# Patient Record
Sex: Male | Born: 1935
Health system: Southern US, Community
[De-identification: ages and names within clinical notes are randomized; demographics above are authoritative.]

## PROBLEM LIST (undated history)

## (undated) ENCOUNTER — Emergency Department (HOSPITAL_COMMUNITY): Admission: EM | Source: Home / Self Care

## (undated) DIAGNOSIS — I10 Essential (primary) hypertension: Secondary | ICD-10-CM

## (undated) DIAGNOSIS — I779 Disorder of arteries and arterioles, unspecified: Secondary | ICD-10-CM

## (undated) DIAGNOSIS — N400 Enlarged prostate without lower urinary tract symptoms: Secondary | ICD-10-CM

## (undated) DIAGNOSIS — I447 Left bundle-branch block, unspecified: Secondary | ICD-10-CM

## (undated) DIAGNOSIS — F0280 Dementia in other diseases classified elsewhere without behavioral disturbance: Secondary | ICD-10-CM

## (undated) DIAGNOSIS — I739 Peripheral vascular disease, unspecified: Secondary | ICD-10-CM

## (undated) DIAGNOSIS — U071 COVID-19: Secondary | ICD-10-CM

## (undated) DIAGNOSIS — E039 Hypothyroidism, unspecified: Secondary | ICD-10-CM

## (undated) DIAGNOSIS — F419 Anxiety disorder, unspecified: Secondary | ICD-10-CM

## (undated) DIAGNOSIS — IMO0002 Reserved for concepts with insufficient information to code with codable children: Secondary | ICD-10-CM

## (undated) DIAGNOSIS — K2940 Chronic atrophic gastritis without bleeding: Secondary | ICD-10-CM

## (undated) DIAGNOSIS — M1990 Unspecified osteoarthritis, unspecified site: Secondary | ICD-10-CM

## (undated) DIAGNOSIS — I2510 Atherosclerotic heart disease of native coronary artery without angina pectoris: Secondary | ICD-10-CM

## (undated) DIAGNOSIS — G4733 Obstructive sleep apnea (adult) (pediatric): Secondary | ICD-10-CM

## (undated) DIAGNOSIS — I219 Acute myocardial infarction, unspecified: Secondary | ICD-10-CM

## (undated) DIAGNOSIS — G309 Alzheimer's disease, unspecified: Secondary | ICD-10-CM

## (undated) DIAGNOSIS — F0390 Unspecified dementia without behavioral disturbance: Secondary | ICD-10-CM

## (undated) DIAGNOSIS — Z923 Personal history of irradiation: Secondary | ICD-10-CM

## (undated) DIAGNOSIS — J1282 Pneumonia due to coronavirus disease 2019: Secondary | ICD-10-CM

## (undated) DIAGNOSIS — R001 Bradycardia, unspecified: Secondary | ICD-10-CM

## (undated) DIAGNOSIS — E782 Mixed hyperlipidemia: Secondary | ICD-10-CM

## (undated) HISTORY — DX: Unspecified osteoarthritis, unspecified site: M19.90

## (undated) HISTORY — DX: Essential (primary) hypertension: I10

## (undated) HISTORY — DX: Reserved for concepts with insufficient information to code with codable children: IMO0002

## (undated) HISTORY — DX: Peripheral vascular disease, unspecified: I73.9

## (undated) HISTORY — DX: Pneumonia due to coronavirus disease 2019: J12.82

## (undated) HISTORY — DX: Dementia in other diseases classified elsewhere, unspecified severity, without behavioral disturbance, psychotic disturbance, mood disturbance, and anxiety: F02.80

## (undated) HISTORY — DX: Bradycardia, unspecified: R00.1

## (undated) HISTORY — PX: CORONARY ANGIOPLASTY WITH STENT PLACEMENT: SHX49

## (undated) HISTORY — DX: Acute myocardial infarction, unspecified: I21.9

## (undated) HISTORY — DX: Disorder of arteries and arterioles, unspecified: I77.9

## (undated) HISTORY — DX: Anxiety disorder, unspecified: F41.9

## (undated) HISTORY — DX: Benign prostatic hyperplasia without lower urinary tract symptoms: N40.0

## (undated) HISTORY — DX: Atherosclerotic heart disease of native coronary artery without angina pectoris: I25.10

## (undated) HISTORY — DX: Alzheimer's disease, unspecified: G30.9

## (undated) HISTORY — DX: Unspecified dementia, unspecified severity, without behavioral disturbance, psychotic disturbance, mood disturbance, and anxiety: F03.90

## (undated) HISTORY — PX: HERNIA REPAIR: SHX51

## (undated) HISTORY — DX: Obstructive sleep apnea (adult) (pediatric): G47.33

## (undated) HISTORY — DX: Left bundle-branch block, unspecified: I44.7

## (undated) HISTORY — DX: COVID-19: U07.1

## (undated) HISTORY — DX: Hypothyroidism, unspecified: E03.9

## (undated) HISTORY — DX: Mixed hyperlipidemia: E78.2

---

## 1994-12-27 HISTORY — PX: CORONARY ARTERY BYPASS GRAFT: SHX141

## 1999-05-12 ENCOUNTER — Inpatient Hospital Stay (HOSPITAL_COMMUNITY): Admission: EM | Admit: 1999-05-12 | Discharge: 1999-05-14 | Payer: Self-pay | Admitting: *Deleted

## 1999-09-11 ENCOUNTER — Inpatient Hospital Stay (HOSPITAL_COMMUNITY): Admission: EM | Admit: 1999-09-11 | Discharge: 1999-09-12 | Payer: Self-pay | Admitting: Cardiology

## 1999-09-12 ENCOUNTER — Encounter: Payer: Self-pay | Admitting: Cardiology

## 2000-04-18 ENCOUNTER — Encounter: Payer: Self-pay | Admitting: Emergency Medicine

## 2000-04-18 ENCOUNTER — Emergency Department (HOSPITAL_COMMUNITY): Admission: EM | Admit: 2000-04-18 | Discharge: 2000-04-18 | Payer: Self-pay | Admitting: Emergency Medicine

## 2002-07-27 ENCOUNTER — Encounter: Payer: Self-pay | Admitting: Internal Medicine

## 2002-07-27 ENCOUNTER — Ambulatory Visit (HOSPITAL_COMMUNITY): Admission: RE | Admit: 2002-07-27 | Discharge: 2002-07-27 | Payer: Self-pay | Admitting: Internal Medicine

## 2003-01-09 ENCOUNTER — Inpatient Hospital Stay (HOSPITAL_COMMUNITY): Admission: AD | Admit: 2003-01-09 | Discharge: 2003-01-11 | Payer: Self-pay | Admitting: *Deleted

## 2003-01-09 ENCOUNTER — Encounter: Payer: Self-pay | Admitting: *Deleted

## 2003-01-11 ENCOUNTER — Encounter: Payer: Self-pay | Admitting: *Deleted

## 2003-03-25 ENCOUNTER — Ambulatory Visit (HOSPITAL_COMMUNITY): Admission: RE | Admit: 2003-03-25 | Discharge: 2003-03-25 | Payer: Self-pay | Admitting: Pulmonary Disease

## 2004-04-07 ENCOUNTER — Emergency Department (HOSPITAL_COMMUNITY): Admission: EM | Admit: 2004-04-07 | Discharge: 2004-04-07 | Payer: Self-pay | Admitting: Emergency Medicine

## 2004-07-22 ENCOUNTER — Ambulatory Visit (HOSPITAL_COMMUNITY): Admission: RE | Admit: 2004-07-22 | Discharge: 2004-07-22 | Payer: Self-pay | Admitting: Internal Medicine

## 2004-08-03 ENCOUNTER — Ambulatory Visit (HOSPITAL_COMMUNITY): Admission: RE | Admit: 2004-08-03 | Discharge: 2004-08-03 | Payer: Self-pay | Admitting: *Deleted

## 2004-08-03 HISTORY — PX: COLONOSCOPY: SHX174

## 2004-11-09 ENCOUNTER — Ambulatory Visit (HOSPITAL_COMMUNITY): Admission: RE | Admit: 2004-11-09 | Discharge: 2004-11-09 | Payer: Self-pay | Admitting: Internal Medicine

## 2004-12-07 ENCOUNTER — Ambulatory Visit: Payer: Self-pay | Admitting: Orthopedic Surgery

## 2005-01-05 ENCOUNTER — Ambulatory Visit: Payer: Self-pay | Admitting: Cardiology

## 2005-01-05 ENCOUNTER — Inpatient Hospital Stay (HOSPITAL_COMMUNITY): Admission: EM | Admit: 2005-01-05 | Discharge: 2005-01-07 | Payer: Self-pay | Admitting: Cardiology

## 2005-01-05 ENCOUNTER — Emergency Department (HOSPITAL_COMMUNITY): Admission: EM | Admit: 2005-01-05 | Discharge: 2005-01-05 | Payer: Self-pay | Admitting: Emergency Medicine

## 2005-01-28 ENCOUNTER — Ambulatory Visit (HOSPITAL_COMMUNITY): Admission: RE | Admit: 2005-01-28 | Discharge: 2005-01-28 | Payer: Self-pay

## 2005-01-28 ENCOUNTER — Ambulatory Visit: Payer: Self-pay | Admitting: Internal Medicine

## 2005-01-29 ENCOUNTER — Ambulatory Visit (HOSPITAL_COMMUNITY): Admission: RE | Admit: 2005-01-29 | Discharge: 2005-01-29 | Payer: Self-pay

## 2005-02-01 ENCOUNTER — Ambulatory Visit: Payer: Self-pay

## 2005-04-22 ENCOUNTER — Encounter (HOSPITAL_COMMUNITY): Admission: RE | Admit: 2005-04-22 | Discharge: 2005-05-22 | Payer: Self-pay | Admitting: Cardiology

## 2005-05-24 ENCOUNTER — Encounter (HOSPITAL_COMMUNITY): Admission: RE | Admit: 2005-05-24 | Discharge: 2005-06-23 | Payer: Self-pay | Admitting: Cardiology

## 2005-06-25 ENCOUNTER — Encounter (HOSPITAL_COMMUNITY): Admission: RE | Admit: 2005-06-25 | Discharge: 2005-07-25 | Payer: Self-pay | Admitting: Cardiology

## 2005-07-26 ENCOUNTER — Encounter (HOSPITAL_COMMUNITY): Admission: RE | Admit: 2005-07-26 | Discharge: 2005-08-25 | Payer: Self-pay | Admitting: Cardiology

## 2006-04-23 ENCOUNTER — Emergency Department (HOSPITAL_COMMUNITY): Admission: EM | Admit: 2006-04-23 | Discharge: 2006-04-23 | Payer: Self-pay | Admitting: Emergency Medicine

## 2006-09-28 ENCOUNTER — Emergency Department (HOSPITAL_COMMUNITY): Admission: EM | Admit: 2006-09-28 | Discharge: 2006-09-28 | Payer: Self-pay | Admitting: Emergency Medicine

## 2006-09-30 ENCOUNTER — Emergency Department (HOSPITAL_COMMUNITY): Admission: EM | Admit: 2006-09-30 | Discharge: 2006-10-01 | Payer: Self-pay | Admitting: Emergency Medicine

## 2006-10-09 ENCOUNTER — Emergency Department (HOSPITAL_COMMUNITY): Admission: EM | Admit: 2006-10-09 | Discharge: 2006-10-09 | Payer: Self-pay | Admitting: Emergency Medicine

## 2007-07-27 ENCOUNTER — Ambulatory Visit: Payer: Self-pay | Admitting: Cardiology

## 2008-06-11 ENCOUNTER — Emergency Department (HOSPITAL_COMMUNITY): Admission: EM | Admit: 2008-06-11 | Discharge: 2008-06-12 | Payer: Self-pay | Admitting: Emergency Medicine

## 2008-07-17 ENCOUNTER — Ambulatory Visit: Payer: Self-pay | Admitting: Cardiology

## 2008-07-18 ENCOUNTER — Inpatient Hospital Stay (HOSPITAL_COMMUNITY): Admission: EM | Admit: 2008-07-18 | Discharge: 2008-07-24 | Payer: Self-pay | Admitting: Emergency Medicine

## 2008-08-20 ENCOUNTER — Ambulatory Visit: Payer: Self-pay | Admitting: Cardiology

## 2008-10-24 ENCOUNTER — Ambulatory Visit: Payer: Self-pay | Admitting: Cardiology

## 2009-02-26 ENCOUNTER — Ambulatory Visit: Payer: Self-pay | Admitting: Cardiology

## 2009-03-18 ENCOUNTER — Encounter: Payer: Self-pay | Admitting: Cardiology

## 2009-09-04 ENCOUNTER — Encounter (INDEPENDENT_AMBULATORY_CARE_PROVIDER_SITE_OTHER): Payer: Self-pay | Admitting: *Deleted

## 2009-09-17 ENCOUNTER — Emergency Department (HOSPITAL_COMMUNITY): Admission: EM | Admit: 2009-09-17 | Discharge: 2009-09-17 | Payer: Self-pay | Admitting: Emergency Medicine

## 2009-09-17 ENCOUNTER — Ambulatory Visit: Payer: Self-pay | Admitting: Cardiovascular Disease

## 2009-09-18 ENCOUNTER — Inpatient Hospital Stay (HOSPITAL_COMMUNITY): Admission: EM | Admit: 2009-09-18 | Discharge: 2009-09-18 | Payer: Self-pay | Admitting: Cardiology

## 2009-10-01 ENCOUNTER — Ambulatory Visit: Payer: Self-pay | Admitting: Internal Medicine

## 2009-10-01 DIAGNOSIS — F0280 Dementia in other diseases classified elsewhere without behavioral disturbance: Secondary | ICD-10-CM | POA: Insufficient documentation

## 2009-10-01 DIAGNOSIS — G4733 Obstructive sleep apnea (adult) (pediatric): Secondary | ICD-10-CM | POA: Insufficient documentation

## 2009-10-01 DIAGNOSIS — I10 Essential (primary) hypertension: Secondary | ICD-10-CM

## 2009-10-01 DIAGNOSIS — G309 Alzheimer's disease, unspecified: Secondary | ICD-10-CM

## 2009-10-01 DIAGNOSIS — I2510 Atherosclerotic heart disease of native coronary artery without angina pectoris: Secondary | ICD-10-CM | POA: Insufficient documentation

## 2009-10-01 DIAGNOSIS — E785 Hyperlipidemia, unspecified: Secondary | ICD-10-CM | POA: Insufficient documentation

## 2009-10-03 ENCOUNTER — Ambulatory Visit (HOSPITAL_COMMUNITY): Admission: RE | Admit: 2009-10-03 | Discharge: 2009-10-03 | Payer: Self-pay | Admitting: Family Medicine

## 2009-10-07 ENCOUNTER — Encounter: Payer: Self-pay | Admitting: Family Medicine

## 2009-11-11 ENCOUNTER — Encounter (HOSPITAL_COMMUNITY): Admission: RE | Admit: 2009-11-11 | Discharge: 2009-12-11 | Payer: Self-pay | Admitting: Neurological Surgery

## 2010-03-01 ENCOUNTER — Emergency Department (HOSPITAL_COMMUNITY): Admission: EM | Admit: 2010-03-01 | Discharge: 2010-03-01 | Payer: Self-pay | Admitting: Emergency Medicine

## 2010-03-19 ENCOUNTER — Ambulatory Visit: Payer: Self-pay | Admitting: Cardiology

## 2010-03-20 ENCOUNTER — Ambulatory Visit (HOSPITAL_COMMUNITY): Admission: RE | Admit: 2010-03-20 | Discharge: 2010-03-20 | Payer: Self-pay | Admitting: Cardiology

## 2010-03-20 ENCOUNTER — Ambulatory Visit: Payer: Self-pay | Admitting: Cardiology

## 2010-03-25 ENCOUNTER — Ambulatory Visit: Payer: Self-pay | Admitting: Cardiology

## 2010-05-27 ENCOUNTER — Ambulatory Visit: Payer: Self-pay | Admitting: Cardiology

## 2010-08-07 ENCOUNTER — Encounter: Payer: Self-pay | Admitting: Cardiology

## 2010-08-15 ENCOUNTER — Inpatient Hospital Stay (HOSPITAL_COMMUNITY): Admission: EM | Admit: 2010-08-15 | Discharge: 2010-08-19 | Payer: Self-pay | Admitting: Emergency Medicine

## 2010-09-07 ENCOUNTER — Ambulatory Visit: Payer: Self-pay | Admitting: Cardiology

## 2010-10-12 ENCOUNTER — Emergency Department (HOSPITAL_COMMUNITY): Admission: EM | Admit: 2010-10-12 | Discharge: 2010-10-12 | Payer: Self-pay | Admitting: Emergency Medicine

## 2010-11-30 ENCOUNTER — Encounter: Payer: Self-pay | Admitting: Cardiology

## 2010-11-30 ENCOUNTER — Ambulatory Visit: Payer: Self-pay | Admitting: Cardiology

## 2010-11-30 DIAGNOSIS — R42 Dizziness and giddiness: Secondary | ICD-10-CM

## 2010-11-30 DIAGNOSIS — K429 Umbilical hernia without obstruction or gangrene: Secondary | ICD-10-CM | POA: Insufficient documentation

## 2010-12-17 ENCOUNTER — Ambulatory Visit: Payer: Self-pay

## 2010-12-17 ENCOUNTER — Ambulatory Visit: Payer: Self-pay | Admitting: Cardiology

## 2011-01-28 NOTE — Assessment & Plan Note (Signed)
Summary: ROV  Medications Added HYDROCHLOROTHIAZIDE 12.5 MG TABS (HYDROCHLOROTHIAZIDE) Take one tablet by mouth daily.      Allergies Added: NKDA  Visit Type:  Follow-up Primary Provider:  fusco  CC:  Chest pain- Sob- Dizzy spells.  History of Present Illness: Patient has been having increased shortness of breath, and had some chest pain requiring NTG.  Had a pretty significant episode that occurred a few nights ago.  Has some also worsening shortness of breath.  Also his wife thinks he likely needs a cath again.  He is agreeble.  The symptoms are similar to what he has had in the past.  Current Medications (verified): 1)  Amlodipine Besylate 10 Mg Tabs (Amlodipine Besylate) .... Take One Tablet By Mouth Daily 2)  Plavix 75 Mg Tabs (Clopidogrel Bisulfate) .... Take One Daily 3)  Lipitor 20 Mg Tabs (Atorvastatin Calcium) .... Take One Daily 4)  Aspir-Low 81 Mg Tbec (Aspirin) .... Take One Daily 5)  Coreg 3.125 Mg Tabs (Carvedilol) .... Take One Two Times A Day 6)  Aricept 10 Mg Tabs (Donepezil Hcl) .... Take One Daily 7)  Hydrochlorothiazide 12.5 Mg Tabs (Hydrochlorothiazide) .... Take One Tablet By Mouth Daily.  Allergies (verified): No Known Drug Allergies  Past History:  Past Medical History: Last updated: 01-16-2009 Arthritis CAD Hyperlipidemia Hypertension Myocardial Infarction hematuria Obstructive Sleep apnea PCI-DES (S/P)  Cypher to SVG PDA 07/22/2008.  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  The patient denies anorexia, fever,  vision loss, decreased hearing, syncope, peripheral edema, prolonged cough, headaches, hemoptysis, abdominal pain, melena, hematochezia, severe indigestion/heartburn, hematuria, incontinence, genital sores, muscle weakness, suspicious skin lesions, transient blindness, difficulty walking, depression, and unusual weight change.    Vital Signs:  Patient profile:   77 year old male Height:      70 inches Weight:      203.25 pounds BMI:     29.27 Pulse rate:   55 / minute Pulse rhythm:   irregular Resp:     20 per minute BP sitting:   140 / 90  (left arm) Cuff size:   large  Vitals Entered By: Vikki Ports (March 19, 2010 2:52 PM)  Physical Exam  General:  Well developed, well nourished, in no acute distress. Head:  normocephalic and atraumatic Eyes:  PERRLA/EOM intact; conjunctiva and lids normal. Ears:  TM's intact and clear with normal canals and hearing Neck:  Neck supple, no JVD. No masses, thyromegaly or abnormal cervical nodes. Lungs:  Clear bilaterally to auscultation and percussion. Heart:  PMI non displaced.  Normal S1 and S2.  Without gallops. Abdomen:  Bowel sounds positive; abdomen soft and non-tender without masses, organomegaly, or hernias noted. No hepatosplenomegaly. Msk:  Back normal, normal gait. Muscle strength and tone normal. Extremities:  No clubbing or cyanosis. Neurologic:  Alert and oriented x 3.   EKG  Procedure date:  03/19/2010  Findings:      NSR.  Nonspecific ST and T abnormality.  Cardiac Cath  Procedure date:  07/19/2008  Findings:       CONCLUSION:  1. Preserved overall LV function.   2. Patent SVG to the OM system.   3. Patent saphenous vein graft to the diagonal.   4. Patent internal mammary to the LAD.   5. An 80% stenosis of the saphenous vein graft to the PDA.      RECOMMENDATIONS:  I plan to discuss the case with Dr. Vic Blackbird.   The patient has had some microscopic hematuria and the patient himself   has been going  back and forth between aspirin and Plavix.  He has not   had major problems.  The stent type will be discussed with my colleagues   and I will make recommendation to the patient.  We tentatively have him   set for Monday morning.   Cardiac Cath  Procedure date:  07/22/2008  Findings:       IMPRESSION:  Coronary artery disease, status post successful placement   of Cypher drug-eluting stent to the saphenous vein graft that leads to   the posterior descending coronary artery.      RECOMMENDATIONS:  This patient should remain on life-time Plavix 75 mg   once daily.   Continue other medications               Verne Carrow, MD   Electronically Signed      Impression & Recommendations:  Problem # 1:  CAD, ARTERY BYPASS GRAFT (ICD-414.04) Recurrent symptoms worrisome for recurrent ischemia.  Reliable symptoms in past.  Prior CABG, then SVG intervention with DES.  Plan restudy.  Risks discussed.   His updated medication list for this problem includes:    Amlodipine Besylate 10 Mg Tabs (Amlodipine besylate) .[name]... Take one tablet by mouth daily    Plavix 75 Mg Tabs (Clopidogrel bisulfate) .[name]... Take one daily    Aspir-low 81 Mg Tbec (Aspirin) .[name]... Take one daily    Coreg 3.125 Mg Tabs (Carvedilol) .[name]... Take one two times a day  Orders: EKG w/ Interpretation (93000) Cardiac Catheterization (Cardiac Cath)  Problem # 2:  HYPERTENSION, BENIGN (ICD-401.1) Controlled. His updated medication list for this problem includes:    Amlodipine Besylate 10 Mg Tabs (Amlodipine besylate) .[name]... Take one tablet by mouth daily    Aspir-low 81 Mg Tbec (Aspirin) .[name]... Take one daily    Coreg 3.125 Mg Tabs (Carvedilol) .[name]... Take one two times a day    Hydrochlorothiazide 12.5 Mg Tabs (Hydrochlorothiazide) .[name]... Take one tablet by mouth daily.  Orders: EKG w/ Interpretation (93000) Cardiac Catheterization (Cardiac Cath)  Problem # 3:  HYPERLIPIDEMIA-MIXED (ICD-272.4) Tolerates.  Lipid  numbers pending. His updated medication list for this problem includes:    Lipitor 20 Mg Tabs (Atorvastatin calcium) .[name]... Take one daily  Orders: EKG w/ Interpretation (93000) Cardiac Catheterization (Cardiac Cath)  Patient Instructions: 1)  Your physician recommends that you schedule a follow-up appointment in: 3 WEEKS 2)  Your physician has requested that you have a cardiac catheterization.  Cardiac catheterization is used to diagnose and/or treat various heart conditions. Doctors may recommend this procedure for a number of different reasons. The most common reason is to evaluate chest pain. Chest pain can be a symptom of coronary artery disease (CAD), and cardiac catheterization can show whether plaque is narrowing or blocking your heart's arteries. This procedure is also used to evaluate the valves, as well as measure the blood flow and oxygen levels in different parts of your heart.  For further information please visit [email or web address]  Please follow instruction sheet, as given.

## 2011-01-28 NOTE — Assessment & Plan Note (Signed)
Summary: eph   Visit Type:  EPH Primary Provider:  fusco  CC:  sob.  History of Present Illness: Dustin [name] is in for followup after undergoing repeat cardiac cath.  We have reviewed his films in extensive detail, and I reviewed them with him today at length.  I also reviewed with my interventional colleauges, specifically the RCA graft which has early SVG disease, but it does not appear flow limiiting, or the source of rest chest discomfort.  We have leaned toward continued medical therapy, and he understands and accepts that.    Current Medications (verified): 1)  Amlodipine Besylate 10 Mg Tabs (Amlodipine Besylate) .... Take One Tablet By Mouth Daily 2)  Plavix 75 Mg Tabs (Clopidogrel Bisulfate) .... Take One Daily 3)  Lipitor 20 Mg Tabs (Atorvastatin Calcium) .... Take One Daily 4)  Aspir-Low 81 Mg Tbec (Aspirin) .... Take One Daily 5)  Coreg 3.125 Mg Tabs (Carvedilol) .... Take One Two Times A Day 6)  Aricept 10 Mg Tabs (Donepezil Hcl) .... Take One Daily 7)  Hydrochlorothiazide 12.5 Mg Tabs (Hydrochlorothiazide) .... Take One Tablet By Mouth Daily.  Allergies (verified): No Known Drug Allergies  Vital Signs:  Patient profile:   77 year old male Height:      70 inches Weight:      205 pounds BMI:     29.52 O2 Sat:      94 % on Room air Pulse rate:   57 / minute Pulse rhythm:   irregular BP sitting:   125 / 75  (left arm) Cuff size:   large  Vitals Entered By: Dustin [name], CMA (March 25, 2010 3:10 PM)  O2 Flow:  Room air  Physical Exam  General:  Well developed, well nourished, in no acute distress. Head:  normocephalic and atraumatic Eyes:  PERRLA/EOM intact; conjunctiva and lids normal. Lungs:  Clear bilaterally to auscultation and percussion. Heart:  PMI non displaced.  Normal S1 and S2 without murmur, rub or gallop Pulses:  Groin healed with bruit.   Extremities:  No clubbing or cyanosis. Neurologic:  Alert and oriented x 3.   EKG  Procedure date:   03/25/2010  Findings:      NSR.  Minor nonspecific IVCD.    Cardiac Cath  Procedure date:  03/20/2010  Findings:      CONCLUSIONS: 1. Preserved overall LV function with low normal ejection fraction. 2. Continued patency of the internal mammary to the LAD. 3. Continued patency of the saphenous vein graft to the diagonal. 4. Continued patency of the saphenous vein graft to two branches of     the marginal with findings as noted above. 5. Continued patency of the previous stent site with progression of     disease in the proximal vessel, but no more than 50%.   DISPOSITION:  This is a difficult decision.  There were some new changes in the saphenous vein graft, which are bit worrisome, but the lesion itself does not appear hemodynamically significant.  I have plan to review extensively with my colleagues before making a recommendation for percutaneous intervention.    Impression & Recommendations:  Problem # 1:  CAD, ARTERY BYPASS GRAFT (ICD-414.04) Has mild symptoms.  Some concern over RCA lesion, but not flow limiting and there is consensus opinion that we should not dilate it at this time as less than 50%.  Plan is for very close follow up, and PCI/Stent for anything suggesting progression of disease by history.  Pt is agreeable, and understands  that we have reviewed in detail.  Given SVG,  FFR and nuc problem not of help as it does not appear flow limiting.  He is to call for any change in symptom pattern.  Fortunately, no longer smokes so hopefully more predictive presentation.   His updated medication list for this problem includes:    Amlodipine Besylate 10 Mg Tabs (Amlodipine besylate) .[name]... Take one tablet by mouth daily    Plavix 75 Mg Tabs (Clopidogrel bisulfate) .[name]... Take one daily    Aspir-low 81 Mg Tbec (Aspirin) .[name]... Take one daily    Coreg 3.125 Mg Tabs (Carvedilol) .[name]... Take one two times a day  Orders: EKG w/ Interpretation (93000)  Problem # 2:   HYPERLIPIDEMIA-MIXED (ICD-272.4) Would treat toward target.  His updated medication list for this problem includes:    Lipitor 20 Mg Tabs (Atorvastatin calcium) .[name]... Take one daily  Problem # 3:  HYPERTENSION, BENIGN (ICD-401.1) Controlled on medical therpay. His updated medication list for this problem includes:    Amlodipine Besylate 10 Mg Tabs (Amlodipine besylate) .[name]... Take one tablet by mouth daily    Aspir-low 81 Mg Tbec (Aspirin) .[name]... Take one daily    Coreg 3.125 Mg Tabs (Carvedilol) .[name]... Take one two times a day    Hydrochlorothiazide 12.5 Mg Tabs (Hydrochlorothiazide) .[name]... Take one tablet by mouth daily.  Orders: EKG w/ Interpretation (93000)  Problem # 4:  OBSTRUCTIVE SLEEP APNEA (ICD-327.23) untreated.    Problem # 5:  ALZHEIMERS DISEASE (ICD-331.0) On aricept.  I do not pick up on any issues in my conversations with him.    Patient Instructions: 1)  Your physician recommends that you schedule a follow-up appointment in: 2 MONTHS 2)  Your physician recommends that you continue on your current medications as directed. Please refer to the Current Medication list given to you today.

## 2011-01-28 NOTE — Assessment & Plan Note (Signed)
Summary: f7m   Visit Type:  3 mo f/u Primary Provider:  fusco  CC:  no cardiac complaints today..pt states he was in the hospital for pneumonia.  History of Present Illness: He had pneumonia, and was in [facility].  He was in for five days, and was now resolved.  Doing much better.  Not havaing xany chest pain of significance.  He passes it off gas aor indigestion.  We had previously discussed stent in SVG.  Complains of feet and lrgs hurting all of the time, especially the feet...    Problems Prior to Update: 1)  Alzheimers Disease  (ICD-331.0) 2)  Obstructive Sleep Apnea  (ICD-327.23) 3)  Long Term Use of Aspirin  (ICD-V58.66) 4)  Hyperlipidemia-mixed  (ICD-272.4) 5)  Hypertension, Benign  (ICD-401.1) 6)  Cad, Artery Bypass Graft  (ICD-414.04)  Medications Prior to Update: 1)  Amlodipine Besylate 10 Mg Tabs (Amlodipine Besylate) .... Take One Tablet By Mouth Daily 2)  Plavix 75 Mg Tabs (Clopidogrel Bisulfate) .... Take One Daily 3)  Lipitor 20 Mg Tabs (Atorvastatin Calcium) .... Take One Daily 4)  Aspir-Low 81 Mg Tbec (Aspirin) .... Take One Daily 5)  Coreg 3.125 Mg Tabs (Carvedilol) .... Take One Two Times A Day 6)  Aricept 10 Mg Tabs (Donepezil Hcl) .... Take One Daily 7)  Hydrochlorothiazide 12.5 Mg Tabs (Hydrochlorothiazide) .... Take One Tablet By Mouth Daily.  Current Medications (verified): 1)  Amlodipine Besylate 10 Mg Tabs (Amlodipine Besylate) .... Take One Tablet By Mouth Daily 2)  Plavix 75 Mg Tabs (Clopidogrel Bisulfate) .... Take One Daily 3)  Simvastatin 40 Mg Tabs (Simvastatin) .[name].. 1 Tab At Bedtime 4)  Aspir-Low 81 Mg Tbec (Aspirin) .... Take One Daily 5)  Coreg 3.125 Mg Tabs (Carvedilol) .... Take One Two Times A Day 6)  Aricept 10 Mg Tabs (Donepezil Hcl) .... Take One Daily 7)  Hydrochlorothiazide 12.5 Mg Tabs (Hydrochlorothiazide) .... Take One Tablet By Mouth Daily. 8)  Nitrostat 0.4 Mg Subl (Nitroglycerin) .[name].. 1 Tablet Under Tongue At Onset of Chest Pain;  You May Repeat Every 5 Minutes For Up To 3 Doses.  Allergies (verified): No Known Drug Allergies  Past History:  Past Medical History: Last updated: 01/09/09 Arthritis CAD Hyperlipidemia Hypertension Myocardial Infarction hematuria Obstructive Sleep apnea PCI-DES (S/P)  Cypher to SVG PDA 07/22/2008.  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 Head:  normocephalic and atraumatic Eyes:  PERRLA/EOM intact; conjunctiva and lids normal. Lungs:  Clear bilaterally to auscultation and percussion. Heart:  PMI non displaced.  Normal S1 and S2.  No definite murmur. Abdomen:  Bowel sounds positive; abdomen soft and non-tender without masses, organomegaly, or hernias noted.  No hepatosplenomegaly. Pulses:  pulses normal in all 4 extremities Extremities:  No clubbing or cyanosis. Neurologic:  Alert and oriented x 3.   EKG  Procedure date:  09/07/2010  Findings:      NSR.  Non specific IVCD.  Non specific T flattening  Impression & Recommendations:  Problem # 1:  CAD, ARTERY BYPASS GRAFT (ICD-414.04) Seems stable at the present time.   No chest pain.  Had recent symptoms of prob pneumonia, with hospitalization.  Continuing medical therapy at present.   His updated  medication list for this problem includes:    Amlodipine Besylate 10 Mg Tabs (Amlodipine besylate) .[name]... Take one tablet by mouth daily    Plavix 75 Mg Tabs (Clopidogrel bisulfate) .[name]... Take one daily    Aspir-low 81 Mg Tbec (Aspirin) .[name]... Take one daily    Coreg 3.125 Mg Tabs (Carvedilol) .[name]... Take one two times a day    Nitrostat 0.4 Mg Subl (Nitroglycerin) .[name]... 1 tablet under tongue at onset of chest pain; you may repeat every 5 minutes for up to 3 doses.  Orders: EKG w/ Interpretation (93000)  Problem # 2:  HYPERTENSION, BENIGN (ICD-401.1) Currently controlled on medication. His updated medication list for this problem includes:    Amlodipine Besylate 10 Mg Tabs (Amlodipine besylate) .[name]... Take one tablet by mouth daily    Aspir-low 81 Mg Tbec (Aspirin) .[name]... Take one daily    Coreg 3.125 Mg Tabs (Carvedilol) .[name]... Take one two times a day    Hydrochlorothiazide 12.5 Mg Tabs (Hydrochlorothiazide) .[name]... Take one tablet by mouth daily.  Problem # 3:  HYPERLIPIDEMIA-MIXED (ICD-272.4) On amlodipine, so will switch to pravastatin, with six weeks lipid and liver.   His updated medication list for this problem includes:    Pravachol 40 Mg Tabs (Pravastatin sodium) .[name]... Take 1 tablet at bedtime  Patient Instructions: 1)  Your physician recommends that you schedule a follow-up appointment in: 3 months with Dr. [name] 2)  Your physician has recommended you make the following change in your medication: Stop Simvastatin Prescriptions: PRAVACHOL 40 MG TABS (PRAVASTATIN SODIUM) Take 1 tablet at bedtime  #30 x 11   Entered by:   Lisabeth Devoid RN   Authorized by:   Ronaldo Miyamoto, MD, [facility]   Signed by:   Lisabeth Devoid RN on 09/07/2010   Method used:   Electronically to        [facility]* (retail)       [address] [place], [place]  [address]       Ph: [phone]       Fax: [phone]   RxID:   [id number] AMLODIPINE BESYLATE 10 MG  TABS (AMLODIPINE BESYLATE) Take one tablet by mouth daily  #90 x 4   Entered by:   Danielle Rankin, CMA   Authorized by:   Ronaldo Miyamoto, MD, [facility]   Signed by:   Danielle Rankin, CMA on 09/07/2010   Method used:   Electronically to        [facility]* (retail)       [address]       [place], [place]  [address]       Ph: [phone]       Fax: [phone]   RxID:   [id number] SIMVASTATIN 40 MG TABS (SIMVASTATIN) 1 tab at bedtime  #90 x 4  Entered by:   Danielle Rankin, CMA   Authorized by:   Ronaldo Miyamoto, MD, [facility]   Signed by:   Danielle Rankin, CMA on 09/07/2010   Method used:   Electronically to        [facility] (retail)       [address]       [place], [place]  [address]       Ph: [phone]       Fax: [phone]   RxID:   [id number]   Appended Document: f25m Office note sent to Dr. [name] for followup for lipids.   Reasons placed in note.  TS

## 2011-01-28 NOTE — Assessment & Plan Note (Signed)
Summary: f22m/dfg   Visit Type:  3 months follow up Primary Provider:  fusco  CC:  Chest pains.  History of Present Illness: Overall doing ok.  Needs to check and see if he is "getting blood flow to the head".  Patient needs to have a hernia repair.  Also wants to get a hernia fixed, but has not seen anybody yet.  He dropped his ASA off period, for a few days.  Has some dizziness from time to time,  so problem describing it clearly.  Wonders if it is related to cerebrovascular disease.  Has had a CPAP machine, it quit working, and he decided to stop using it.    Wants me to get information from Dr. Aldean Ast about blockage in legs.    Problems Prior to Update: 1)  Dizziness  (ICD-780.4) 2)  Hernia, Umbilical  (ICD-553.1) 3)  Alzheimers Disease  (ICD-331.0) 4)  Obstructive Sleep Apnea  (ICD-327.23) 5)  Long Term Use of Aspirin  (ICD-V58.66) 6)  Hyperlipidemia-mixed  (ICD-272.4) 7)  Hypertension, Benign  (ICD-401.1) 8)  Cad, Artery Bypass Graft  (ICD-414.04)  Current Medications (verified): 1)  Amlodipine Besylate 10 Mg Tabs (Amlodipine Besylate) .... Take One Tablet By Mouth Daily 2)  Plavix 75 Mg Tabs (Clopidogrel Bisulfate) .... Take One Daily 3)  Pravachol 40 Mg Tabs (Pravastatin Sodium) .... Take 1 Tablet At Bedtime 4)  Aspir-Low 81 Mg Tbec (Aspirin) .... Take One Daily. On Hold 5)  Coreg 3.125 Mg Tabs (Carvedilol) .... Take One Two Times A Day. Patient Is Taking Only Once A Day 6)  Aricept 10 Mg Tabs (Donepezil Hcl) .... Take One Daily 7)  Hydrochlorothiazide 12.5 Mg Tabs (Hydrochlorothiazide) .... Take One Tablet By Mouth Daily. 8)  Nitrostat 0.4 Mg Subl (Nitroglycerin) .[name].. 1 Tablet Under Tongue At Onset of Chest Pain; You May Repeat Every 5 Minutes For Up To 3 Doses. 9)  Fentanyl 50 Mcg/hr Pt72 (Fentanyl) .... One Patch Every 3 Days  Allergies (verified): No Known Drug Allergies  Vital Signs:  Patient profile:   77 year old male Height:      70 inches Weight:      206.50  pounds BMI:     29.74 Pulse rate:   53 / minute Pulse rhythm:   regular Resp:     18 per minute BP sitting:   140 / 70  (left arm) Cuff size:   large  Vitals Entered By: Vikki Ports (November 30, 2010 4:16 PM)  Physical Exam  General:  Well developed, well nourished, in no acute distress. Head:  normocephalic and atraumatic Eyes:  PERRLA/EOM intact; conjunctiva and lids normal. Neck:  No definite bruits. Lungs:  Clear bilaterally to auscultation and percussion. Heart:  Pmi non displaced. Normal S1 and S2.  No def murmur.  Abdomen:  Bowel sounds positive; abdomen soft and non-tender without masses, organomegaly.  No hepatosplenomegaly.  large ventral hernia.    Cardiac Cath  Procedure date:  03/20/2010  Findings:      CONCLUSIONS:   1. Preserved overall LV function with low normal ejection fraction.   2. Continued patency of the internal mammary to the LAD.   3. Continued patency of the saphenous vein graft to the diagonal.   4. Continued patency of the saphenous vein graft to two branches of       the marginal with findings as noted above.   5. Continued patency of the previous stent site with progression of       disease  in the proximal vessel, but no more than 50%.      DISPOSITION:  This is a difficult decision.  There were some new changes   in the saphenous vein graft, which are bit worrisome, but the lesion   itself does not appear hemodynamically significant.  I have plan to   review extensively with my colleagues before making a recommendation for   percutaneous intervention.      EKG  Procedure date:  11/30/2010  Findings:      SB.  Nonspecific ST abnormality.  Diffuse T flattening  Impression & Recommendations:  Problem # 1:  CAD, ARTERY BYPASS GRAFT (ICD-414.04) Continues with stable symptoms.  No angina.  Would continue medical therpay.  See cath report.   His updated medication list for this problem includes:    Amlodipine Besylate 10 Mg Tabs  (Amlodipine besylate) .[name]... Take one tablet by mouth daily    Plavix 75 Mg Tabs (Clopidogrel bisulfate) .[name]... Take one daily    Aspir-low 81 Mg Tbec (Aspirin) .[name]... Take one daily. on hold    Coreg 3.125 Mg Tabs (Carvedilol) .[name]... Take one two times a day. patient is taking only once a day    Nitrostat 0.4 Mg Subl (Nitroglycerin) .[name]... 1 tablet under tongue at onset of chest pain; you may repeat every 5 minutes for up to 3 doses.  Orders: EKG w/ Interpretation (93000)  Problem # 2:  HERNIA, UMBILICAL (ICD-553.1) appears relatively stable. Would likely watch for now.    Problem # 3:  HYPERLIPIDEMIA-MIXED (ICD-272.4)  continue medication and check lipid and liver. His updated medication list for this problem includes:    Pravachol 40 Mg Tabs (Pravastatin sodium) .[name]... Take 1 tablet at bedtime  His updated medication list for this problem includes:    Pravachol 40 Mg Tabs (Pravastatin sodium) .[name]... Take 1 tablet at bedtime  Problem # 4:  HYPERTENSION, BENIGN (ICD-401.1) controlled on current medications. His updated medication list for this problem includes:    Amlodipine Besylate 10 Mg Tabs (Amlodipine besylate) .[name]... Take one tablet by mouth daily    Aspir-low 81 Mg Tbec (Aspirin) .[name]... Take one daily. on hold    Coreg 3.125 Mg Tabs (Carvedilol) .[name]... Take one two times a day. patient is taking only once a day    Hydrochlorothiazide 12.5 Mg Tabs (Hydrochlorothiazide) .[name]... Take one tablet by mouth daily.  His updated medication list for this problem includes:    Amlodipine Besylate 10 Mg Tabs (Amlodipine besylate) .[name]... Take one tablet by mouth daily    Aspir-low 81 Mg Tbec (Aspirin) .[name]... Take one daily. on hold    Coreg 3.125 Mg Tabs (Carvedilol) .[name]... Take one two times a day. patient is taking only once a day    Hydrochlorothiazide 12.5 Mg Tabs (Hydrochlorothiazide) .[name]... Take one tablet by mouth daily.  Problem # 5:  DIZZINESS (ICD-780.4)  No obvious carotid bruits.  Will  check dopplers at patient request.   Orders: Carotid Duplex (Carotid Duplex)  Patient Instructions: 1)  Your physician recommends that you schedule a follow-up appointment in: 3 months 2)  Your physician recommends that you return for a FASTING lipid profile, liver profile, CBC and BMP on day of carotid doppler appointment-- 414.04,272.4 3)  Your physician recommends that you continue on your current medications as directed. Please refer to the Current Medication list given to you today. 4)  Your physician has requested that you have a carotid duplex. This test is an ultrasound of the carotid arteries in  your neck. It looks at blood flow through these arteries that supply the brain with blood. Allow one hour for this exam. There are no restrictions or special instructions.

## 2011-01-28 NOTE — Letter (Signed)
Summary: Cardiac Catheterization Instructions- Main Lab  [facility], Main Office  [address]   [place], [place] [address]   Phone: [phone]  Fax: [phone]     03/19/2010 MRN: [id number]  PEARL BERLINGER [address] [place], [place]  [address]  Dear Mr. GIORDAN, FORDHAM are scheduled for Cardiac Catheterization on Friday March 20, 2010              with Dr. [name].  Please arrive at the [facility] at 10:00      a.m. on the day of your procedure.  1. DIET     _X___ Nothing to eat or drink after midnight except your medications with a sip of water.  2. MAKE SURE YOU TAKE YOUR ASPIRIN AND PLAVIX.  3. _X___ YOU MAY TAKE ALL of your remaining medications with a small amount of water.  4. Plan for one night stay - bring personal belongings (i.e. toothpaste, toothbrush, etc.)  5. Bring a current list of your medications and current insurance cards.  6. Must have a responsible person to drive you home.   7. Someone must be with you for the first 24 hours after you arrive home.  8. Please wear clothes that are easy to get on and off and wear slip-on shoes.  *Special note: Every effort is made to have your procedure done on time.  Occasionally there are emergencies that present themselves at the hospital that may cause delays.  Please be patient if a delay does occur.  If you have any questions after you get home, please call the office at the number listed above.  Julieta Gutting, RN, BSN

## 2011-01-28 NOTE — Assessment & Plan Note (Signed)
Summary: F2M   Visit Type:  2 months follow up Primary Provider:  fusco  CC:  Some chest pains.  History of Present Illness: Overall doing well.  Was short of breath.  Has been doing a little bit of light weights.  He used to walk 3 to 4 days of week, and he gets pain up his hips.  So he lies in bed and tries to do so light weight lifting.  He feels somewhat better.  Sometimes in the morning, he will spit up some white phlegm, and will spit it in the commode.  Films reviewed with Dr. Juanda Chance, and Dr. Valerie Roys, and the consensus was that we shold monitor him medically.    Current Medications (verified): 1)  Amlodipine Besylate 10 Mg Tabs (Amlodipine Besylate) .... Take One Tablet By Mouth Daily 2)  Plavix 75 Mg Tabs (Clopidogrel Bisulfate) .... Take One Daily 3)  Lipitor 20 Mg Tabs (Atorvastatin Calcium) .... Take One Daily 4)  Aspir-Low 81 Mg Tbec (Aspirin) .... Take One Daily 5)  Coreg 3.125 Mg Tabs (Carvedilol) .... Take One Two Times A Day 6)  Aricept 10 Mg Tabs (Donepezil Hcl) .... Take One Daily 7)  Hydrochlorothiazide 12.5 Mg Tabs (Hydrochlorothiazide) .... Take One Tablet By Mouth Daily.  Allergies (verified): No Known Drug Allergies  Past History:  Past Medical History: Last updated: 2009/01/18 Arthritis CAD Hyperlipidemia Hypertension Myocardial Infarction hematuria Obstructive Sleep apnea PCI-DES (S/P)  Cypher to SVG PDA 07/22/2008.  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 Head:  normocephalic and atraumatic Eyes:  PERRLA/EOM intact; conjunctiva and lids normal. Lungs:  Clear bilaterally to auscultation and percussion. Heart:  PMI non displaced.  Normal S1 and S2.  Minimal SEM.   Abdomen:  Bowel sounds positive; abdomen soft and non-tender without masses, organomegaly, or hernias noted. No hepatosplenomegaly. Msk:  Back normal, normal gait. Muscle strength and tone normal. Pulses:  pulses normal in all 4 extremities Extremities:  No clubbing or cyanosis. Neurologic:  Alert and oriented x 3.   CXR  Procedure date:  09/17/2009  Findings:       PORTABLE CHEST - 1 VIEW    Comparison: 07/23/2008    Findings: Heart size and vascularity are normal and the lungs are   clear.  There is evidence of previous coronary artery bypass   surgery.  No discrete bony abnormality.    IMPRESSION:   No acute disease.    Read By:  Gwynn Burly,  M.D.   Released By:  Gwynn Burly,  M.D.  EKG  Procedure date:  05/27/2010  Findings:  NSR.  Nonspecific IVCD.[name]  Nonspecific T abnormality  Impression & Recommendations:  Problem # 1:  CAD, ARTERY BYPASS GRAFT (ICD-414.04)  remains stable at this poin.  Consensus agreement to manage conservatively.   His updated medication list for this problem includes:    Amlodipine Besylate 10 Mg Tabs (Amlodipine besylate) .[name]... Take one tablet by mouth daily    Plavix 75 Mg Tabs (Clopidogrel bisulfate) .[name]... Take one daily    Aspir-low 81 Mg Tbec (Aspirin) .[name]... Take one daily    Coreg 3.125 Mg Tabs (Carvedilol) .[name]... Take one two times a day  Orders: EKG w/ Interpretation (93000)  Problem # 2:  OBSTRUCTIVE SLEEP APNEA (ICD-327.23) does not use CPAP at home--does not  use  Problem # 3:  HYPERTENSION, BENIGN (ICD-401.1)  controlled at present.  His updated medication list for this problem includes:    Amlodipine Besylate 10 Mg Tabs (Amlodipine besylate) .[name]... Take one tablet by mouth daily    Aspir-low 81 Mg Tbec (Aspirin) .[name]... Take one daily    Coreg 3.125 Mg Tabs (Carvedilol) .[name]... Take one two times a day    Hydrochlorothiazide 12.5 Mg Tabs (Hydrochlorothiazide) .[name]... Take one tablet by mouth daily.  Orders: EKG w/ Interpretation (93000)  Problem # 4:  HYPERLIPIDEMIA-MIXED (ICD-272.4)  tolerating well.  His updated medication list for this problem includes:    Lipitor 20 Mg Tabs (Atorvastatin calcium) .[name]... Take one daily  Orders: EKG w/ Interpretation (93000)  Patient Instructions: 1)  Your physician recommends that you schedule a follow-up appointment in: 3 MONTHS 2)  Your physician recommends that you continue on your current medications as directed. Please refer to the Current Medication list given to you today.

## 2011-01-28 NOTE — Letter (Signed)
Summary: High Risk for Cardiovascular Events Care Consideration  High Risk for Cardiovascular Events Care Consideration   Imported By: Roderic Ovens 09/30/2010 12:00:52  _____________________________________________________________________  External Attachment:    Type:   Image     Comment:   External Document  Appended Document: High Risk for Cardiovascular Events Care Consideration Not a statin candidate--cannot tolerate.  TS

## 2011-02-13 ENCOUNTER — Emergency Department (HOSPITAL_COMMUNITY): Payer: No Typology Code available for payment source

## 2011-02-13 ENCOUNTER — Inpatient Hospital Stay (HOSPITAL_COMMUNITY)
Admission: EM | Admit: 2011-02-13 | Discharge: 2011-02-15 | DRG: 313 | Disposition: A | Discharge status: Home / Self Care | Payer: No Typology Code available for payment source | Attending: Internal Medicine | Admitting: Internal Medicine

## 2011-02-13 DIAGNOSIS — G309 Alzheimer's disease, unspecified: Secondary | ICD-10-CM | POA: Diagnosis present

## 2011-02-13 DIAGNOSIS — R0789 Other chest pain: Principal | ICD-10-CM | POA: Diagnosis present

## 2011-02-13 DIAGNOSIS — K219 Gastro-esophageal reflux disease without esophagitis: Secondary | ICD-10-CM | POA: Diagnosis present

## 2011-02-13 DIAGNOSIS — G8929 Other chronic pain: Secondary | ICD-10-CM | POA: Diagnosis present

## 2011-02-13 DIAGNOSIS — E785 Hyperlipidemia, unspecified: Secondary | ICD-10-CM | POA: Diagnosis present

## 2011-02-13 DIAGNOSIS — Z951 Presence of aortocoronary bypass graft: Secondary | ICD-10-CM

## 2011-02-13 DIAGNOSIS — I10 Essential (primary) hypertension: Secondary | ICD-10-CM | POA: Diagnosis present

## 2011-02-13 DIAGNOSIS — K5900 Constipation, unspecified: Secondary | ICD-10-CM | POA: Diagnosis present

## 2011-02-13 DIAGNOSIS — G609 Hereditary and idiopathic neuropathy, unspecified: Secondary | ICD-10-CM | POA: Diagnosis present

## 2011-02-13 DIAGNOSIS — F0280 Dementia in other diseases classified elsewhere without behavioral disturbance: Secondary | ICD-10-CM | POA: Diagnosis present

## 2011-02-13 DIAGNOSIS — I2510 Atherosclerotic heart disease of native coronary artery without angina pectoris: Secondary | ICD-10-CM | POA: Diagnosis present

## 2011-02-13 DIAGNOSIS — J00 Acute nasopharyngitis [common cold]: Secondary | ICD-10-CM | POA: Diagnosis present

## 2011-02-13 LAB — DIFFERENTIAL
Basophils Absolute: 0.1 10*3/uL (ref 0.0–0.1)
Basophils Relative: 1 % (ref 0–1)
Eosinophils Absolute: 0.3 10*3/uL (ref 0.0–0.7)
Eosinophils Relative: 4 % (ref 0–5)
Monocytes Absolute: 1 10*3/uL (ref 0.1–1.0)

## 2011-02-13 LAB — CBC
HCT: 38.9 % — ABNORMAL LOW (ref 39.0–52.0)
MCHC: 33.7 g/dL (ref 30.0–36.0)
Platelets: 234 10*3/uL (ref 150–400)
RDW: 12.5 % (ref 11.5–15.5)

## 2011-02-13 LAB — HEPATIC FUNCTION PANEL
AST: 17 U/L (ref 0–37)
Albumin: 3.4 g/dL — ABNORMAL LOW (ref 3.5–5.2)
Total Bilirubin: 0.9 mg/dL (ref 0.3–1.2)

## 2011-02-13 LAB — PROTIME-INR: INR: 1 (ref 0.00–1.49)

## 2011-02-13 LAB — POCT CARDIAC MARKERS
CKMB, poc: <1 ng/mL — ABNORMAL LOW (ref 1.0–8.0)
Myoglobin, poc: 50.3 ng/mL (ref 12–200)

## 2011-02-13 LAB — HEPARIN LEVEL (UNFRACTIONATED): Heparin Unfractionated: 0.42 IU/mL (ref 0.30–0.70)

## 2011-02-13 LAB — CARDIAC PANEL(CRET KIN+CKTOT+MB+TROPI)
Relative Index: INVALID (ref 0.0–2.5)
Relative Index: INVALID (ref 0.0–2.5)
Total CK: 58 U/L (ref 7–232)
Troponin I: 0.01 ng/mL (ref 0.00–0.06)

## 2011-02-13 LAB — COMPREHENSIVE METABOLIC PANEL
ALT: 18 U/L (ref 0–53)
Calcium: 9.5 mg/dL (ref 8.4–10.5)
GFR calc Af Amer: >60 mL/min (ref >60)
Glucose, Bld: 109 mg/dL — ABNORMAL HIGH (ref 70–99)
Sodium: 139 mEq/L (ref 135–145)
Total Protein: 6.7 g/dL (ref 6.0–8.3)

## 2011-02-13 LAB — D-DIMER, QUANTITATIVE: D-Dimer, Quant: 0.25 ug/mL-FEU (ref 0.00–0.48)

## 2011-02-13 LAB — LIPID PANEL
Total CHOL/HDL Ratio: 5.5 RATIO
VLDL: 33 mg/dL (ref 0–40)

## 2011-02-13 LAB — APTT: aPTT: 33 seconds (ref 24–37)

## 2011-02-13 LAB — TSH: TSH: 5.582 u[IU]/mL — ABNORMAL HIGH (ref 0.350–4.500)

## 2011-02-14 LAB — CBC
Platelets: 208 10*3/uL (ref 150–400)
RDW: 12.5 % (ref 11.5–15.5)
WBC: 6.2 10*3/uL (ref 4.0–10.5)

## 2011-02-14 LAB — COMPREHENSIVE METABOLIC PANEL
ALT: 16 U/L (ref 0–53)
Albumin: 3.4 g/dL — ABNORMAL LOW (ref 3.5–5.2)
Alkaline Phosphatase: 49 U/L (ref 39–117)
BUN: 9 mg/dL (ref 6–23)
Calcium: 8.8 mg/dL (ref 8.4–10.5)
Potassium: 3.7 mEq/L (ref 3.5–5.1)
Sodium: 140 mEq/L (ref 135–145)
Total Protein: 5.9 g/dL — ABNORMAL LOW (ref 6.0–8.3)

## 2011-02-14 LAB — DIFFERENTIAL
Basophils Absolute: 0.1 10*3/uL (ref 0.0–0.1)
Eosinophils Absolute: 0.4 10*3/uL (ref 0.0–0.7)
Eosinophils Relative: 7 % — ABNORMAL HIGH (ref 0–5)

## 2011-02-14 LAB — HEPARIN LEVEL (UNFRACTIONATED): Heparin Unfractionated: 0.34 IU/mL (ref 0.30–0.70)

## 2011-02-15 DIAGNOSIS — R079 Chest pain, unspecified: Secondary | ICD-10-CM

## 2011-02-15 LAB — CBC
Hemoglobin: 13.2 g/dL (ref 13.0–17.0)
MCH: 31 pg (ref 26.0–34.0)
MCHC: 32.9 g/dL (ref 30.0–36.0)
MCV: 94.1 fL (ref 78.0–100.0)

## 2011-02-15 LAB — HEPARIN LEVEL (UNFRACTIONATED): Heparin Unfractionated: 0.33 IU/mL (ref 0.30–0.70)

## 2011-02-15 LAB — DIFFERENTIAL
Basophils Relative: 1 % (ref 0–1)
Lymphs Abs: 1.8 10*3/uL (ref 0.7–4.0)
Monocytes Absolute: 0.9 10*3/uL (ref 0.1–1.0)
Monocytes Relative: 12 % (ref 3–12)
Neutro Abs: 3.8 10*3/uL (ref 1.7–7.7)

## 2011-02-16 ENCOUNTER — Ambulatory Visit (INDEPENDENT_AMBULATORY_CARE_PROVIDER_SITE_OTHER): Payer: No Typology Code available for payment source | Admitting: Cardiology

## 2011-02-16 ENCOUNTER — Encounter: Payer: Self-pay | Admitting: Cardiology

## 2011-02-16 DIAGNOSIS — I10 Essential (primary) hypertension: Secondary | ICD-10-CM

## 2011-02-16 DIAGNOSIS — E7800 Pure hypercholesterolemia, unspecified: Secondary | ICD-10-CM

## 2011-02-16 DIAGNOSIS — I25810 Atherosclerosis of coronary artery bypass graft(s) without angina pectoris: Secondary | ICD-10-CM

## 2011-02-21 NOTE — Discharge Summary (Signed)
NAMEESTES, LEHNER               ACCOUNT NO.:  [id number]  MEDICAL RECORD NO.:  [id number]           PATIENT TYPE:  I  LOCATION:  A339                          FACILITY:  [facility]  PHYSICIAN:  Nateisha Moyd L. Lendell Caprice, MDDATE OF BIRTH:  Apr 30, 1934  DATE OF ADMISSION:  02/13/2011 DATE OF DISCHARGE:  02/20/2012LH                              DISCHARGE SUMMARY   DISCHARGE DIAGNOSES: 1. Chest pain, myocardial infarction ruled out. 2. Coronary artery disease with history of coronary artery bypass     graft. 3. Chronic constipation. 4. History of hypertension, normotensive off medications during this     hospitalization. 5. Peripheral neuropathy, chronic pain. 6. Hyperlipidemia. 7. Alzheimer dementia. 8. Rhinopharyngitis.  DISCHARGE MEDICATIONS: 1. Hold amlodipine and hydrochlorothiazide until blood pressure     recheck tomorrow at Dr. Rosalyn Charters office. 2. Fentanyl patch 50 mcg every other day. 3. Plavix 75 mg a day. 4. Xanax 1 mg nightly or as needed. 5. Aricept 10 mg a day. 6. Fish oil capsules 1000 mg a day. 7. Aspirin 81 mg a day. 8. Flaxseed oil daily. 9. Carvedilol 3.125 mg nightly. 10.Guaifenesin LA 1 tablet p.o. b.i.d. as needed for congestion. 11.Flonase 2 sprays per nostril daily.  CONDITION:  Stable.  ACTIVITY:  Ad lib.  Follow up with Dr. [name] tomorrow at 2:30 p.m.  Follow up with Dr. [name].  To repeat TSH and follow up free T4 and free T3 which is currently pending.  CONDITION:  Stable.  CONSULTATIONS:   Cardiology.  PROCEDURES:  None.  DIET:  Heart-healthy.  ACTIVITY:  Ad lib.  LABORATORY DATA:  CBC normal.  Complete metabolic panel normal.  Cardiac enzymes negative.  BNP normal.  LDL 66, HDL 22, triglycerides 166, TSH 5.582.  PENDING LABS:  Free T4 and free T3.  DIAGNOSTICS:  Chest x-ray showed nothing acute.  EKG showed normal sinus rhythm, nonspecific changes.  HISTORY AND HOSPITAL COURSE:  Please see H and P for admission  details.  Mr. Tesch is a 77 year old white male with history of heart disease who presented with chest pain.  It was sharp but was relieved by 2 nitroglycerin tablets.  He was chest pain free in the emergency room and had normal cardiac enzymes and EKG.  He was placed on observation and started on a heparin drip.  Cardiology was consulted and he has been cleared for discharge and will follow up with Dr. [name] tomorrow in the office.  The patient had previously been on a long-acting nitrate, but does not have this listed as one of his medications.  He had been off his antihypertensives while here and had blood pressures ranging in the 90s to low 100s.  I have therefore not resumed his nitrate and I have asked him to hold his amlodipine and hydrochlorothiazide.  This will need to be followed up with Dr. [name].  The patient had some constipation during the hospitalization which resolved with a bowel regimen.  He also complained of rhinorrhea and improved with Flonase which is what he usually takes and Claritin.     Kirstine Jacquin L. Lendell Caprice, MD     CLS/MEDQ  D:  02/15/2011  T:  02/16/2011  Job:  [id number]  cc:   [name]. [name], MD, [facility] [address]  [address] [place] [address]  [name], MD Fax: [phone]  Electronically Signed by Crista Curb MD on 02/21/2011 09:27:19 PM

## 2011-03-04 ENCOUNTER — Encounter: Payer: Self-pay | Admitting: Cardiology

## 2011-03-04 ENCOUNTER — Ambulatory Visit (INDEPENDENT_AMBULATORY_CARE_PROVIDER_SITE_OTHER): Payer: No Typology Code available for payment source | Admitting: Cardiology

## 2011-03-04 DIAGNOSIS — I2510 Atherosclerotic heart disease of native coronary artery without angina pectoris: Secondary | ICD-10-CM

## 2011-03-04 DIAGNOSIS — E7800 Pure hypercholesterolemia, unspecified: Secondary | ICD-10-CM

## 2011-03-04 NOTE — Assessment & Plan Note (Signed)
Summary: 3 month rov   Primary Provider:  fusco  CC:  follow up [place].  History of Present Illness: Patient had episode of chest pain.  He took two NTGs and his wife made him go  to the hospital.  He was not happy to go.  We talked today.  Based on his prior angio, I have suggested he have a repeat cath as his last study suggested progression of SVG disease.  However he wants to hold off.  He denies recurrent pain.  They did reduce his meds because of reduced BP.   Problems Prior to Update: 1)  Dizziness  (ICD-780.4) 2)  Hernia, Umbilical  (ICD-553.1) 3)  Alzheimers Disease  (ICD-331.0) 4)  Obstructive Sleep Apnea  (ICD-327.23) 5)  Long Term Use of Aspirin  (ICD-V58.66) 6)  Hyperlipidemia-mixed  (ICD-272.4) 7)  Hypertension, Benign  (ICD-401.1) 8)  Cad, Artery Bypass Graft  (ICD-414.04)  Current Medications (verified): 1)  Plavix 75 Mg Tabs (Clopidogrel Bisulfate) .... Take One Daily 2)  Pravachol 40 Mg Tabs (Pravastatin Sodium) .... Take 1 Tablet At Bedtime 3)  Aspir-Low 81 Mg Tbec (Aspirin) .... Take One Daily. On Hold 4)  Coreg 3.125 Mg Tabs (Carvedilol) .... Take One Two Times A Day. Patient Is Taking Only Once A Day 5)  Aricept 10 Mg Tabs (Donepezil Hcl) .... Take One Daily 6)  Nitrostat 0.4 Mg Subl (Nitroglycerin) .[name].. 1 Tablet Under Tongue At Onset of Chest Pain; You May Repeat Every 5 Minutes For Up To 3 Doses.  Allergies: No Known Drug Allergies  Past History:  Past Medical History: Last updated: 12/26/2008 Arthritis CAD Hyperlipidemia Hypertension Myocardial Infarction hematuria Obstructive Sleep apnea PCI-DES (S/P)  Cypher to SVG PDA 07/22/2008.  Vital Signs:  Patient profile:   77 year old male Height:      70 inches Weight:      202 pounds BMI:     29.09 Pulse rate:   61 / minute Resp:     18 per minute BP sitting:   120 / 74  (left arm)  Vitals Entered By: Kem Parkinson (February 16, 2011 2:45 PM)  Physical Exam  General:  Well developed,  well nourished, in no acute distress. Head:  normocephalic and atraumatic Eyes:  PERRLA/EOM intact; conjunctiva and lids normal. Lungs:  Clear bilaterally to auscultation and percussion. Heart:  PMI non displaced Normal S1 and S2.  No murmur, rub or gallop. Abdomen:  Bowel sounds positive; abdomen soft and non-tender without masses, organomegaly, or hernias noted. No hepatosplenomegaly. Pulses:  pulses normal in all 4 extremities Extremities:  No clubbing or cyanosis. Neurologic:  Alert and oriented x 3.   Cardiac Cath  Procedure date:  03/21/2010  Findings:       CONCLUSIONS:   1. Preserved overall LV function with low normal ejection fraction.   2. Continued patency of the internal mammary to the LAD.   3. Continued patency of the saphenous vein graft to the diagonal.   4. Continued patency of the saphenous vein graft to two branches of       the marginal with findings as noted above.   5. Continued patency of the previous stent site with progression of       disease in the proximal vessel, but no more than 50%.      DISPOSITION:  This is a difficult decision.  There were some new changes   in the saphenous vein graft, which are bit worrisome, but the lesion   itself does  not appear hemodynamically significant.  I have plan to   review extensively with my collea  Cardiac Cath  Procedure date:  02/16/2011  Findings:      NSR.  LAD  Non specific ST and T wave changes.    Impression & Recommendations:  Problem # 1:  CAD, ARTERY BYPASS GRAFT (ICD-414.04) I am worrried about his symptoms.  I favor repeat cath, and his wife is aware as well.  He wants to hold off for recurrent symptoms, but will call.  Will see back in two weeks.  The following medications were removed from the medication list:    Amlodipine Besylate 10 Mg Tabs (Amlodipine besylate) .[name]... Take one tablet by mouth daily His updated medication list for this problem includes:    Plavix 75 Mg Tabs (Clopidogrel  bisulfate) .[name]... Take one daily    Aspir-low 81 Mg Tbec (Aspirin) .[name]... Take one daily. on hold    Coreg 3.125 Mg Tabs (Carvedilol) .[name]... Take one two times a day. patient is taking only once a day    Nitrostat 0.4 Mg Subl (Nitroglycerin) .[name]... 1 tablet under tongue at onset of chest pain; you may repeat every 5 minutes for up to 3 doses.  Problem # 2:  HYPERLIPIDEMIA-MIXED (ICD-272.4) continue meds His updated medication list for this problem includes:    Pravachol 40 Mg Tabs (Pravastatin sodium) .[name]... Take 1 tablet at bedtime  Problem # 3:  HYPERTENSION, BENIGN (ICD-401.1) Some meds stopped.  Continue current recommendations.  The following medications were removed from the medication list:    Amlodipine Besylate 10 Mg Tabs (Amlodipine besylate) .[name]... Take one tablet by mouth daily    Hydrochlorothiazide 12.5 Mg Tabs (Hydrochlorothiazide) .[name]... Take one tablet by mouth daily. His updated medication list for this problem includes:    Aspir-low 81 Mg Tbec (Aspirin) .[name]... Take one daily. on hold    Coreg 3.125 Mg Tabs (Carvedilol) .[name]... Take one two times a day. patient is taking only once a day  Patient Instructions: 1)  Your physician recommends that you schedule a follow-up appointment in: 2 weeks with Dr. [name] 2)  Your physician recommends that you continue on your current medications as directed. Please refer to the Current Medication list given to you today.

## 2011-03-08 ENCOUNTER — Ambulatory Visit: Payer: Self-pay | Admitting: Cardiology

## 2011-03-11 LAB — DIFFERENTIAL
Basophils Absolute: 0 10*3/uL (ref 0.0–0.1)
Basophils Absolute: 0 10*3/uL (ref 0.0–0.1)
Basophils Relative: 0 % (ref 0–1)
Basophils Relative: 1 % (ref 0–1)
Eosinophils Absolute: 0 10*3/uL (ref 0.0–0.7)
Eosinophils Absolute: 0.2 10*3/uL (ref 0.0–0.7)
Eosinophils Relative: 0 % (ref 0–5)
Eosinophils Relative: 3 % (ref 0–5)
Eosinophils Relative: 6 % — ABNORMAL HIGH (ref 0–5)
Lymphocytes Relative: 19 % (ref 12–46)
Lymphocytes Relative: 27 % (ref 12–46)
Lymphocytes Relative: 45 % (ref 12–46)
Lymphs Abs: 1.2 10*3/uL (ref 0.7–4.0)
Lymphs Abs: 1.5 10*3/uL (ref 0.7–4.0)
Monocytes Absolute: 0.1 10*3/uL (ref 0.1–1.0)
Monocytes Absolute: 0.4 10*3/uL (ref 0.1–1.0)
Monocytes Relative: 13 % — ABNORMAL HIGH (ref 3–12)
Neutro Abs: 1.2 10*3/uL — ABNORMAL LOW (ref 1.7–7.7)
Neutro Abs: 2.4 10*3/uL (ref 1.7–7.7)
Neutrophils Relative %: 36 % — ABNORMAL LOW (ref 43–77)

## 2011-03-11 LAB — BASIC METABOLIC PANEL
BUN: 10 mg/dL (ref 6–23)
CO2: 29 mEq/L (ref 19–32)
CO2: 31 mEq/L (ref 19–32)
Calcium: 8.6 mg/dL (ref 8.4–10.5)
Chloride: 101 mEq/L (ref 96–112)
Chloride: 103 mEq/L (ref 96–112)
Creatinine, Ser: 0.64 mg/dL (ref 0.4–1.5)
GFR calc Af Amer: >60 mL/min (ref >60)
GFR calc Af Amer: >60 mL/min (ref >60)
GFR calc non Af Amer: >60 mL/min (ref >60)
Glucose, Bld: 106 mg/dL — ABNORMAL HIGH (ref 70–99)
Glucose, Bld: 118 mg/dL — ABNORMAL HIGH (ref 70–99)
Potassium: 3.6 mEq/L (ref 3.5–5.1)
Potassium: 3.9 mEq/L (ref 3.5–5.1)
Sodium: 136 mEq/L (ref 135–145)
Sodium: 139 mEq/L (ref 135–145)

## 2011-03-11 LAB — VITAMIN B12: Vitamin B-12: 298 pg/mL (ref 211–911)

## 2011-03-11 LAB — COMPREHENSIVE METABOLIC PANEL
ALT: 28 U/L (ref 0–53)
AST: 27 U/L (ref 0–37)
Albumin: 3.4 g/dL — ABNORMAL LOW (ref 3.5–5.2)
Alkaline Phosphatase: 39 U/L (ref 39–117)
CO2: 29 mEq/L (ref 19–32)
Chloride: 104 mEq/L (ref 96–112)
GFR calc Af Amer: >60 mL/min (ref >60)
GFR calc non Af Amer: >60 mL/min (ref >60)
Potassium: 4 mEq/L (ref 3.5–5.1)
Sodium: 139 mEq/L (ref 135–145)
Total Bilirubin: 0.6 mg/dL (ref 0.3–1.2)

## 2011-03-11 LAB — URINALYSIS, ROUTINE W REFLEX MICROSCOPIC
Bilirubin Urine: NEGATIVE
Glucose, UA: NEGATIVE mg/dL
Hgb urine dipstick: NEGATIVE
Specific Gravity, Urine: 1.025 (ref 1.005–1.030)
Urobilinogen, UA: 0.2 mg/dL (ref 0.0–1.0)
pH: 6 (ref 5.0–8.0)

## 2011-03-11 LAB — CULTURE, BLOOD (ROUTINE X 2): Culture: NO GROWTH

## 2011-03-11 LAB — TROPONIN I: Troponin I: 0.04 ng/mL (ref 0.00–0.06)

## 2011-03-11 LAB — URINE CULTURE
Colony Count: NO GROWTH
Culture  Setup Time: 201108212245
Culture: NO GROWTH

## 2011-03-11 LAB — CBC
HCT: 37.2 % — ABNORMAL LOW (ref 39.0–52.0)
HCT: 39.8 % (ref 39.0–52.0)
Hemoglobin: 12.7 g/dL — ABNORMAL LOW (ref 13.0–17.0)
Hemoglobin: 12.7 g/dL — ABNORMAL LOW (ref 13.0–17.0)
MCH: 32.1 pg (ref 26.0–34.0)
MCHC: 34.2 g/dL (ref 30.0–36.0)
MCV: 93.3 fL (ref 78.0–100.0)
MCV: 93.9 fL (ref 78.0–100.0)
Platelets: 175 10*3/uL (ref 150–400)
Platelets: 183 10*3/uL (ref 150–400)
Platelets: 188 10*3/uL (ref 150–400)
RBC: 3.95 MIL/uL — ABNORMAL LOW (ref 4.22–5.81)
RBC: 3.96 MIL/uL — ABNORMAL LOW (ref 4.22–5.81)
RBC: 4.27 MIL/uL (ref 4.22–5.81)
RDW: 12.8 % (ref 11.5–15.5)
WBC: 3.3 10*3/uL — ABNORMAL LOW (ref 4.0–10.5)
WBC: 4.4 10*3/uL (ref 4.0–10.5)
WBC: 4.7 10*3/uL (ref 4.0–10.5)

## 2011-03-11 LAB — CARDIAC PANEL(CRET KIN+CKTOT+MB+TROPI)
CK, MB: 1.2 ng/mL (ref 0.3–4.0)
CK, MB: 1.2 ng/mL (ref 0.3–4.0)
Relative Index: INVALID (ref 0.0–2.5)
Relative Index: INVALID (ref 0.0–2.5)
Total CK: 58 U/L (ref 7–232)
Troponin I: 0.01 ng/mL (ref 0.00–0.06)
Troponin I: 0.03 ng/mL (ref 0.00–0.06)

## 2011-03-11 LAB — CK TOTAL AND CKMB (NOT AT ARMC)
CK, MB: 1.4 ng/mL (ref 0.3–4.0)
Relative Index: INVALID (ref 0.0–2.5)

## 2011-03-11 LAB — TSH: TSH: 6.441 u[IU]/mL — ABNORMAL HIGH (ref 0.350–4.500)

## 2011-03-11 LAB — BRAIN NATRIURETIC PEPTIDE
Pro B Natriuretic peptide (BNP): 57.1 pg/mL (ref 0.0–100.0)
Pro B Natriuretic peptide (BNP): <30 pg/mL (ref 0.0–100.0)

## 2011-03-14 NOTE — H&P (Signed)
NAMEREINO, LYBBERT NO.:  [id number]  MEDICAL RECORD NO.:  [id number]           PATIENT TYPE:  E  LOCATION:  APED                          FACILITY:  [facility]  PHYSICIAN:  Osvaldo Shipper, MD     DATE OF BIRTH:  08/16/1934  DATE OF ADMISSION:  02/13/2011 DATE OF DISCHARGE:  LH                             HISTORY & PHYSICAL   PRIMARY CARE PHYSICIAN:  [name], MD  CARDIOLOGIST:  [name]. [name], MD, [facility] with [place].  ADMISSION DIAGNOSES: 1. Chest pain, etiology unclear. 2. History of coronary artery disease status post coronary artery     bypass surgery. 3. History of Alzheimer dementia.  CHIEF COMPLAINT:  Chest pain last night.  HISTORY OF PRESENT ILLNESS:  The patient is a 77 year old Caucasian male who has a history of coronary artery disease, presented to the hospital with complaints of chest pain.  He said he was getting ready to go to the bed at 11 p.m. last night when he started having chest pain which was described as sharp pain, 6/10 in intensity, took nitroglycerin. There was some relief of pain but the pain increased.  He took another nitroglycerin with which he had good relief of pain.  Denies any fever, has a cough.  He feels he is congested.  Denies any nausea, vomiting or dizziness.  He short of breath chronically.  He also has been burping and belching a lot in his last few hours.  Denies any acid reflux. Denies any radiation of the pain anywhere.  He says that he has some left shoulder spurs, which also cause pain similar to the chest pain he has been having.  No aggravating or relieving factors.  No precipitating factors identified.  MEDICATIONS AT HOME:  Unfortunately did not bring in his home medication list.  He tells me he is on Aricept, Plavix, aspirin, fentanyl patch, oxycodone as needed and 2 blood pressure medications and 1 cholesterol medication.  I have requested his wife to bring in his medications  this morning.  ALLERGIES:  No known drug allergies.  PAST MEDICAL HISTORY: 1. Alzheimer dementia. 2. Arthritis. 3. CHF. 4. Coronary artery disease status post CABG. 5. Hypercholesterolemia. 6. Hypertension. 7. He has had MI in the past. 8. He has got history of neuropathy. 9. He had a cardiac cath in March 2011, which showed good patency,     however, there was progression of disease in the saphenous vein     graft in the proximal vessel.  No intervention was done at that     time.  SURGICAL HISTORY:  CABG about 15 years ago.  SOCIAL HISTORY:  Lives in [place] with his wife.  He denies smoking, alcohol, or illicit drug use.  He is usually active.  FAMILY HISTORY:  Positive for heart disease.  REVIEW OF SYSTEMS:  GENERAL:  Positive for weakness, malaise.  HEENT: Unremarkable.  CARDIOVASCULAR:  As in HPI.  RESPIRATORY:  As in HPI. GI:  Unremarkable.  GU:  Unremarkable.  NEUROLOGICAL:  Unremarkable. PSYCHIATRIC:  Unremarkable.  DERMATOLOGICAL: Unremarkable.  Other systems reviewed and found to be negative.  PHYSICAL EXAMINATION:  VITAL SIGNS:  Temperature is 98.9, blood pressure 106/55, heart rate is in the 40s and 50s, respiratory rate 20, saturation 96% on room air. GENERAL:  This is an elderly white male in no distress. HEENT:  Head is normocephalic, atraumatic.  Pupils are equal and reacting.  No pallor, no icterus.  Oral:  Mucous membrane is moist.  No oral lesions are noted. NECK:  Soft and supple.  No thyromegaly is appreciated. LUNGS:  Few scattered wheezing occasionally, but otherwise mostly clear to auscultation.  Chest wall was not tender to palpation. CARDIOVASCULAR:  S1 and S2 is normal.  Regular.  No S3, S4 rubs, murmurs or bruits. ABDOMEN:  Soft, nontender, nondistended.  Bowel sounds are present.  No masses or organomegaly appreciated. GU:  Deferred. MUSCULOSKELETAL:  Normal muscle mass and tone. NEUROLOGIC:  He is alert, oriented x3.  No focal  neurological deficits are present. SKIN:  Does not reveal any rashes.  EKG shows a sinus rhythm with normal axis, intervals appear to be in the normal range, although there maybe some evidence for interventricular conduction delay.  No Q-waves.  No concerning ST changes.  There is nonspecific T-wave flattening noted diffusely.  LABORATORY DATA:  His CBC is unremarkable.  CMET is pending.  Coags are normal.  Cardiac enzymes negative x2.  He had chest x-ray which showed no active disease.  ASSESSMENT:  This is a 77 year old Caucasian male with a past medical history of coronary artery disease who presents with chest pain.  Pain is described as sharp.  However, it was relieved with nitroglycerin. Differential diagnosis include angina, pulmonary embolus, gastrointestinal etiology as he mentioned burping and belching.  PLAN: 1. Chest pain.  We will get LFTs.  We will get a D-dimer.  If it is     abnormal, a CT angio will be performed.  Cardiac panel will be     continued to rule out acute coronary syndrome.  EKG will be     repeated.  Lipid panel will be checked.  This case was discussed by     the ED physician with the [facility] cardiology physician on-call and     they recommended observation here at [facility]. 2. History of CAD, otherwise stable.  Continue to monitor. 3. History of Alzheimer dementia.  We will hold his Aricept because of     the bradycardia. 4. PPI will be initiated.  Further management decisions will depend on results of further testing and the patient's response to treatment.  Please also note that the patient mentioned constipation, so we will put him on MiraLax as well to help him relieve the constipation.  Osvaldo Shipper, MD     GK/MEDQ  D:  02/13/2011  T:  02/13/2011  Job:  [id number]  cc:   [name], MD Fax: [phone]  [name]. [name], MD, [facility] [address]  [address] [place] [address]  Electronically Signed by Osvaldo Shipper MD on  03/14/2011 06:43:22 AM

## 2011-03-19 LAB — CBC
HCT: 42.6 % (ref 39.0–52.0)
Hemoglobin: 14.6 g/dL (ref 13.0–17.0)
MCHC: 34.3 g/dL (ref 30.0–36.0)
MCV: 97.4 fL (ref 78.0–100.0)
RDW: 12.7 % (ref 11.5–15.5)

## 2011-03-19 LAB — BASIC METABOLIC PANEL
BUN: 13 mg/dL (ref 6–23)
CO2: 31 mEq/L (ref 19–32)
Calcium: 8.9 mg/dL (ref 8.4–10.5)
Glucose, Bld: 110 mg/dL — ABNORMAL HIGH (ref 70–99)
Sodium: 143 mEq/L (ref 135–145)

## 2011-03-25 NOTE — Assessment & Plan Note (Signed)
Summary: 2wk f/u   Primary Provider:  fusco  CC:  follow up.  History of Present Illness: [name] seems somewhat better at the present time.  We had recommended consideration of repeat cath study, but he wanted to hold off.  His symptoms are now improved over what they were previously.  No pain at rest.  See my last note.    Current Medications (verified): 1)  Plavix 75 Mg Tabs (Clopidogrel Bisulfate) .... Take One Daily 2)  Pravachol 40 Mg Tabs (Pravastatin Sodium) .... Take 1 Tablet At Bedtime 3)  Aspir-Low 81 Mg Tbec (Aspirin) .[name].. 1 Tab By Mouth Once Daily 4)  Coreg 3.125 Mg Tabs (Carvedilol) .... Take One Two Times A Day. Patient Is Taking Only Once A Day 5)  Aricept 10 Mg Tabs (Donepezil Hcl) .... Take One Daily 6)  Nitrostat 0.4 Mg Subl (Nitroglycerin) .[name].. 1 Tablet Under Tongue At Onset of Chest Pain; You May Repeat Every 5 Minutes For Up To 3 Doses.  Allergies: No Known Drug Allergies  Past History:  Past Medical History: Last updated: 2009-01-24 Arthritis CAD Hyperlipidemia Hypertension Myocardial Infarction hematuria Obstructive Sleep apnea PCI-DES (S/P)  Cypher to SVG PDA 07/22/2008.  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 Head:  normocephalic and atraumatic Eyes:   PERRLA/EOM intact; conjunctiva and lids normal. Chest Wall:  MS well healed. Lungs:  Clear bilaterally to auscultation and percussion. Heart:  PMI non displaced. Normal S1 and S2.  No murmur or rub.   Extremities:  No clubbing or cyanosis. Neurologic:  Alert and oriented x 3.   EKG  Procedure date:  03/04/2011  Findings:      SB.  Nonspecific IVCD.  Nonspecific T abnormality.  No acute changes.  Impression & Recommendations:  Problem # 1:  CAD, ARTERY BYPASS GRAFT (ICD-414.04) symptoms are better.  We will continue to follow him closely.  He does not want cath at present time.  Will remain on good medical therapy in the interim. His updated medication list for this problem includes:    Plavix 75 Mg Tabs (Clopidogrel bisulfate) .[name]... Take one daily    Aspir-low 81 Mg Tbec (Aspirin) .[name]... 1 tab by mouth once daily    Coreg 3.125 Mg Tabs (Carvedilol) .[name]... Take one two times a day. patient is taking only once a day    Nitrostat 0.4 Mg Subl (Nitroglycerin) .[name]... 1 tablet under tongue at onset of chest pain; you may repeat every 5 minutes for up to 3 doses.  Orders: EKG w/ Interpretation (93000)  Problem # 2:  HYPERLIPIDEMIA-MIXED (ICD-272.4) He has some dementia.  He is on statins, and taking Aricept.  The dose is fairly  low. His updated medication list for this problem includes:    Pravachol 40 Mg Tabs (Pravastatin sodium) .[name]... Take 1 tablet at bedtime  Orders: EKG w/ Interpretation (93000)  Problem # 3:  HYPERTENSION, BENIGN (ICD-401.1) controlled at this point.   His updated medication list for this problem includes:    Aspir-low 81 Mg Tbec (Aspirin) .[name]... 1 tab by mouth once daily    Coreg 3.125 Mg Tabs (Carvedilol) .[name]... Take one two times a day. patient is taking only once a day  Orders: EKG w/ Interpretation (93000)  Patient Instructions: 1)  Your physician recommends that you schedule a follow-up appointment in: 6 WEEKS 2)  Your physician recommends that you  continue on your current medications as directed. Please refer to the Current Medication list given to you today.

## 2011-04-02 LAB — BASIC METABOLIC PANEL
CO2: 30 mEq/L (ref 19–32)
Calcium: 9.7 mg/dL (ref 8.4–10.5)
Chloride: 101 mEq/L (ref 96–112)
Creatinine, Ser: 0.86 mg/dL (ref 0.4–1.5)
GFR calc Af Amer: >60 mL/min (ref >60)
GFR calc non Af Amer: >60 mL/min (ref >60)
Glucose, Bld: 206 mg/dL — ABNORMAL HIGH (ref 70–99)
Potassium: 4.4 mEq/L (ref 3.5–5.1)
Sodium: 140 mEq/L (ref 135–145)
Sodium: 139 mEq/L (ref 135–145)

## 2011-04-02 LAB — URINALYSIS, ROUTINE W REFLEX MICROSCOPIC
Bilirubin Urine: NEGATIVE
Glucose, UA: 250 mg/dL — AB
Hgb urine dipstick: NEGATIVE
Specific Gravity, Urine: 1.015 (ref 1.005–1.030)
Urobilinogen, UA: 2 mg/dL — ABNORMAL HIGH (ref 0.0–1.0)
pH: 7 (ref 5.0–8.0)

## 2011-04-02 LAB — DIFFERENTIAL
Basophils Absolute: 0 10*3/uL (ref 0.0–0.1)
Basophils Relative: 0 % (ref 0–1)
Eosinophils Absolute: 0.3 10*3/uL (ref 0.0–0.7)
Monocytes Absolute: 0.6 10*3/uL (ref 0.1–1.0)
Neutro Abs: 7 10*3/uL (ref 1.7–7.7)

## 2011-04-02 LAB — POCT CARDIAC MARKERS
CKMB, poc: 2.7 ng/mL (ref 1.0–8.0)
CKMB, poc: 3.7 ng/mL (ref 1.0–8.0)
Myoglobin, poc: 88 ng/mL (ref 12–200)
Troponin i, poc: <0.05 ng/mL (ref 0.00–0.09)

## 2011-04-02 LAB — CBC
Hemoglobin: 14.8 g/dL (ref 13.0–17.0)
MCHC: 34.7 g/dL (ref 30.0–36.0)
MCV: 94.6 fL (ref 78.0–100.0)
RDW: 12.8 % (ref 11.5–15.5)

## 2011-04-02 LAB — D-DIMER, QUANTITATIVE: D-Dimer, Quant: 0.29 ug/mL-FEU (ref 0.00–0.48)

## 2011-04-02 LAB — CARDIAC PANEL(CRET KIN+CKTOT+MB+TROPI)
CK, MB: 3 ng/mL (ref 0.3–4.0)
CK, MB: 3.6 ng/mL (ref 0.3–4.0)
Relative Index: 2.3 (ref 0.0–2.5)
Relative Index: 2.4 (ref 0.0–2.5)
Troponin I: 0.02 ng/mL (ref 0.00–0.06)

## 2011-04-08 NOTE — Consult Note (Signed)
Dustin [name], [name]               ACCOUNT NO.:  [id number]  MEDICAL RECORD NO.:  [id number]           PATIENT TYPE:  I  LOCATION:  A339                          FACILITY:  [facility]  PHYSICIAN:  [name]. [name], MD, FACCDATE OF BIRTH:  09/19/1934  DATE OF CONSULTATION: DATE OF DISCHARGE:                                CONSULTATION   DATE OF CONSULTATION:  February 15, 2011  PRIMARY CARDIOLOGIST:  [name]. [name], MD, [facility] in the [place] office.  PRIMARY CARE PHYSICIAN:  [name], MD  REQUESTING PHYSICIAN:  [name] hospitalist service team I.  REASON FOR CONSULTATION:  Chest pain.  HISTORY OF PRESENT ILLNESS:  This is a 77 year old Caucasian male with known history of coronary artery disease, coronary artery bypass grafting, GERD, hypercholesterolemia and hypertension who presents to [facility] emergency room with complaints of sharp pain, which began around 11 p.m. on the day of admission.  The patient used nitroglycerin x2 with no relief.  He was therefore seen at [facility] Emergency Room. The pain was nonradiating.  It did not cause any shortness of breath, dizziness or diaphoresis.  The patient states that he had not had a bowel movement in several days.  Once he was able to have a bowel movement, he states that these symptoms went away and he has not had any further symptoms since admission.  We were asked for consultation in this setting of known CAD with a significant progression of disease in the proximal vessel of the saphenous vein graft to the circumflex.  The patient has had a recent cardiac catheterization per Dr. [name] in March 2007, at which time Dr. [name] wanted to review the necessity of progressing with a PCI or angioplasty in the known vessel versus continued medical treatment.  He is followed by Dr. [name] regularly every 3 months.  REVIEW OF SYSTEMS:  Positive for chest pain, GERD, arthritis pain.  All other systems were reviewed and  found to be negative.  PAST MEDICAL HISTORY: 1. Coronary artery disease:     a.     Status post myocardial infarction.     b.     Status post successful placement of Cypher drug-eluting      stent to the saphenous vein graft that leads to the posterior      descending artery in July 2009.     c.     Status post CABG with LIMA to LAD, SVG to diagonal, SVG to      marginal, SVG to circumflex. 2. Status post cardiac catheterization March 2011 revealing preserved     overall LV function.     a.     Continued patency of LIMA to LAD.  Continued patency of      saphenous vein to diagonal.  Continued patency of saphenous vein      to 2 branches and the marginal with noted 30% narrowing in the      graft.  The first touch sound is widely patent, second touch sound      demonstrates 30-40% diffuse narrowing in the distal portion of the  graft to second segment.     b.     Saphenous vein graft that had been previously stented,      revealed new progressive area about 50% narrowing in the      midportion of the graft, slightly hypodense in no view does it      exceed 50% luminal reduction more distally.  The stent is widely      patent from the previous intervention.  There is a 30% narrowing      in the distal end.  Distended portion of the graft inserts nicely      into the PDA.  Continuation of the branch assessed and retrograde      matter from the vein graft has but 80% narrowing going to the      posterior lateral segment, which was not easily accessible      percutaneously. 3. Alzheimer's. 4. Hypercholesterolemia. 5. Hypertension. 6. GERD. 7. Arthritis.  SOCIAL HISTORY:  He lives in [place] with his wife.  He is retired. He does not smoke, drink or use drugs.  FAMILY HISTORY:  Unobtainable at present.  MEDICATIONS:  Current medications prior to admission; 1. Amlodipine 10 mg daily. 2. Plavix 75 mg daily. 3. Pravachol 40 mg at bedtime. 4. Aspirin 81 mg daily. 5. Coreg  3.125 mg b.i.d. 6. Aricept 10 mg daily. 7. Fentanyl patch 50 mg every 3 days. 8. Nitroglycerin 0.4 mg p.r.n.  ALLERGIES:  No known drug allergies.  LABORATORY DATA:  Current labs; sodium 140, potassium 3.7, chloride 101, CO2 of 30, BUN 9, creatinine 0.64, glucose 97, hemoglobin 12.9, hematocrit 39.1.  White blood cells 6.2, platelets 208.  TSH 5.5, troponin 0.02 and 0.01 respectively.  EKG revealing normal sinus rhythm with a rate of 50 beats per minute with nonspecific T-wave abnormalities laterally.  Chest x-ray revealing no active disease.  PHYSICAL EXAMINATION:  VITAL SIGNS:  Blood pressure 106/61, pulse 55, respirations 16, temperature 98.3, O2 sat 93% on room air.  Weight 90.7 kg. GENERAL:  He is awake, alert and oriented in no acute distress. HEENT:  Head is normocephalic and atraumatic.  Eyes, PERRLA. CARDIOVASCULAR:  Regular rate and rhythm with 1/6 systolic murmur auscultated.  Pulses are diminished in the lower extremities.  Radial pulses are 1+ bilaterally. LUNGS:  Clear to auscultation without wheezes, rales or rhonchi. ABDOMEN:  Soft, nontender with 2+ bowel sounds. EXTREMITIES:  There is no clubbing or cyanosis.  He does not have palpable pulses, dorsalis pedis or posterior tibial.  He does have popliteal pulses bilaterally. MUSCULOSKELETAL:  No joint deformity or effusions. NEURO:  Cranial nerves II-XII are grossly intact.  IMPRESSION: 1. Chest pain, typical and atypical features, sharp, not relieved with     nitroglycerin, nonradiating with no associated shortness of breath,     diaphoresis.  Cardiac enzymes are negative.  He does have some     nonspecific T-wave abnormalities laterally, does not appear to be     cardiac in etiology secondary to the presentation. 2. Gastroesophageal reflux disease.  The patient states that he has     been constipated, but once able to have a bowel movement symptoms     of chest discomfort have been eliminated.  The patient may  need to     be placed on a low-dose stool softener daily to assist with this. 3. Coronary artery disease, status post CABG with SVG stenosis at the     stent side.  The patient is to see Dr. [name]  tomorrow, February 16, 2011 at 2:30 for followup.  We will add Imdur for symptomatic     relief with more recommendations per Dr. [name] concerning need     for intervention or continued medical management.  We would like to thank the [facility] hospitalist service for allowing [place] to participate in the care of this patient.     Bettey Mare. Lyman Bishop, NP   ______________________________ [name] [name], MD, [facility]    KML/MEDQ  D:  02/15/2011  T:  02/15/2011  Job:  [id number]  cc:   [name]. [name], MD, [facility] [address]  [address] [place] [address]  [name], MD Fax: [phone]  Electronically Signed by Joni Reining NP on 03/29/2011 08:00:00 AM Electronically Signed by Shawnie Pons MD [facility] on 04/08/2011 05:40:00 AM

## 2011-04-16 ENCOUNTER — Encounter: Payer: Self-pay | Admitting: *Deleted

## 2011-04-16 ENCOUNTER — Encounter: Payer: Self-pay | Admitting: Cardiology

## 2011-04-19 ENCOUNTER — Ambulatory Visit (INDEPENDENT_AMBULATORY_CARE_PROVIDER_SITE_OTHER): Payer: No Typology Code available for payment source | Admitting: Cardiology

## 2011-04-19 ENCOUNTER — Encounter: Payer: Self-pay | Admitting: Cardiology

## 2011-04-19 DIAGNOSIS — I2510 Atherosclerotic heart disease of native coronary artery without angina pectoris: Secondary | ICD-10-CM

## 2011-04-19 DIAGNOSIS — I25810 Atherosclerosis of coronary artery bypass graft(s) without angina pectoris: Secondary | ICD-10-CM

## 2011-04-19 DIAGNOSIS — E785 Hyperlipidemia, unspecified: Secondary | ICD-10-CM

## 2011-04-19 DIAGNOSIS — I10 Essential (primary) hypertension: Secondary | ICD-10-CM

## 2011-04-19 NOTE — Assessment & Plan Note (Signed)
No progression of symptoms.  Continue medical therapy.

## 2011-04-19 NOTE — Assessment & Plan Note (Signed)
BP well controlled.

## 2011-04-19 NOTE — Patient Instructions (Signed)
Your physician recommends that you schedule a follow-up appointment in: 3 MONTHS  Your physician recommends that you continue on your current medications as directed. Please refer to the Current Medication list given to you today.   

## 2011-04-19 NOTE — Progress Notes (Signed)
HPI:  Patient is doing well.  Discussed HDL levels today.  No progression of chest pain.  He did not come for carotid US, but he does not want it.    Current Outpatient Prescriptions  Medication Sig Dispense Refill  . aspirin 81 MG tablet Take 81 mg by mouth daily.        . carvedilol (COREG) 3.125 MG tablet Take 3.125 mg by mouth 2 (two) times daily with a meal.        . clopidogrel (PLAVIX) 75 MG tablet Take 75 mg by mouth daily.        [name] donepezil (ARICEPT) 10 MG tablet Take 10 mg by mouth at bedtime as needed.        [name] HYDROcodone-acetaminophen (NORCO) 10-325 MG per tablet Take 1 tablet by mouth every 6 (six) hours as needed.        . nitroGLYCERIN (NITROSTAT) 0.4 MG SL tablet Place 0.4 mg under the tongue every 5 (five) minutes as needed.        . pravastatin (PRAVACHOL) 40 MG tablet Take 40 mg by mouth daily.          No Known Allergies  Past Medical History  Diagnosis Date  . Arthritis   . CAD (coronary artery disease)   . Hyperlipidemia   . HTN (hypertension)   . MI (myocardial infarction)   . Hematuria   . OSA (obstructive sleep apnea)   . Dementia     Past Surgical History  Procedure Date  . Coronary artery bypass graft     1996 x5:   LIMA to LAD, SVG D2, SVG to PDA, SVG to OM1,2  . Hernia repair     Family History  Problem Relation Age of Onset  . Heart disease      History   Social History  . Marital Status: Married    Spouse Name: N/A    Number of Children: N/A  . Years of Education: N/A   Occupational History  . Not on file.   Social History Main Topics  . Smoking status: Former Smoker    Types: Cigarettes    Quit date: 12/27/1994  . Smokeless tobacco: Not on file  . Alcohol Use: No  . Drug Use: No  . Sexually Active: Not on file   Other Topics Concern  . Not on file   Social History Narrative  . No narrative on file    ROS: Please see the HPI.  All other systems reviewed and negative.  PHYSICAL EXAM:  BP 127/78  Pulse 55  Resp 18   Ht 5\' 11"  (1.803 m)  Wt 199 lb 12.8 oz (90.629 kg)  BMI 27.87 kg/m2  General: Well developed, well nourished, in no acute distress. Head:  Normocephalic and atraumatic. Neck: no JVD Lungs: Clear to auscultation and percussion. Heart: Normal S1 and S2.  Minimal SEM.  No rub or gallop Abdomen:  Normal bowel sounds; soft; non tender; no organomegaly Pulses: Pulses normal in all 4 extremities. Extremities: No clubbing or cyanosis. No edema. Neurologic: Alert and oriented x 3.  EKG:SB. IVCD.  Nonspecific ST and T abnormality.  ASSESSMENT AND PLAN:

## 2011-04-19 NOTE — Assessment & Plan Note (Signed)
We discussed low HDL.  Reveiwed aim high.  Also discussed options.  For now, observe.

## 2011-05-11 NOTE — Cardiovascular Report (Signed)
NAMELAURIS, Dustin [name] NO.:  [id number]   MEDICAL RECORD NO.:  [id number]          PATIENT TYPE:  INP   LOCATION:  2627                         FACILITY:  [facility]   PHYSICIAN:  Verne Carrow, MDDATE OF BIRTH:  [birth year]   DATE OF PROCEDURE:  07/22/2008  DATE OF DISCHARGE:                            CARDIAC CATHETERIZATION   PROCEDURE:  Coronary intervention.   OPERATORS:  [name]. [name], MD, [facility] (Scrubbed in as proctoring  physician)  Verne Carrow, MD   DIAGNOSIS:  Coronary artery disease.   INDICATION:  Chest pain with a diagnostic left heart catheterization  performed on Friday, July 19, 2008, at which time, a significant  stenosis was discovered in the saphenous vein graft to the posterior  descending artery.   PROCEDURE:  Percutaneous coronary intervention of saphenous vein graft  to the posterior descending coronary artery with placement of a 3.0 mm x  33 mm Cypher drug-eluting stent.   The patient was brought to the catheterization laboratory after signing  informed consent.  The right groin was prepped and draped in the sterile  fashion.  A 6-French sheath was placed in the right femoral artery.  A 6-  [place] RCB guide was used to engage the ostium of the saphenous vein  graft to the posterior descending coronary artery.  A P[place] wire was  passed down the saphenous vein graft into the posterior descending  coronary artery.  A Spider 3.0-mm distal embolization protection device  was then placed in the saphenous vein graft.  At this time, a 3.0 x 33  mm Cypher drug-eluting stent was placed into the distal body of the  saphenous vein graft.  There were no complications.  The patient did  receive 300 mg of Plavix prior to the procedure.  Bivalirudin was used  for anticoagulation during the procedure.  The patient did chew 325 mg  of aspirin prior to the procedure.  Manual pressure was used for  vascular closure.   IMPRESSION:   Coronary artery disease, status post successful placement  of Cypher drug-eluting stent to the saphenous vein graft that leads to  the posterior descending coronary artery.   RECOMMENDATIONS:  This patient should remain on life-time Plavix 75 mg  once daily.  Continue other medications      Verne Carrow, MD  Electronically Signed     CM/MEDQ  D:  07/22/2008  T:  07/23/2008  Job:  [id number]

## 2011-05-11 NOTE — H&P (Signed)
Dustin [name], Dustin [name] NO.:  [id number]   MEDICAL RECORD NO.:  [id number]          PATIENT TYPE:  INP   LOCATION:  6532                         FACILITY:  [facility]   PHYSICIAN:  Armanda Magic, M.D.     DATE OF BIRTH:  05/24/1934   DATE OF ADMISSION:  07/17/2008  DATE OF DISCHARGE:                              HISTORY & PHYSICAL   REFERRING PHYSICIAN:  Dr. Lynelle Doctor in the emergency room.   PRIMARY CARDIOLOGIST:  Dr. Maisie Fus D. [name], MD, [facility].   CHIEF COMPLAINT:  Chest pain.   HISTORY OF PRESENT ILLNESS:  This is a 77 year old white male with a  history of coronary artery disease who presented to the emergency room  with complaints of chest pain starting after lunch.  The patient took 3  sublingual nitroglycerins over 1 hour with improvement in the pain.  The  pain then reoccurred again.  He took a baby aspirin and a Xanax which  brought his systolic blood pressure down from the 160s.  He then called  EMS and presented to the emergency room.  Since his admission to the ER,  he has had intermittent sharp substernal chest pain along with some  chest pressure in the midsternal region with radiation to the left  shoulder.  He has also felt short of breath.  He denies any nausea,  vomiting, or diaphoresis.  He says he did become chilled and had to put  a coat on.  His chest pain is similar to prior angina.  Normally, he  takes 1 nitroglycerin every 3-4 weeks, but over the past 24 hours  significantly increased his intake of nitroglycerin.   PAST MEDICAL HISTORY:  1. Coronary artery disease status post CABG with LIMA to the LAD,      saphenous vein graft to the second diagonal, saphenous vein graft      to the PDA, and sequential saphenous vein graft to the OM-1 and OM-      2 in 1996.  2. Hypertension.  3. Dyslipidemia.  4. Obstructive sleep apnea.  5. History of myocardial infarction.  6. Last cardiac catheterization in January 2006 showed severe three-      vessel  coronary disease with patent but atretic LIMA to the LAD,      90% posterolateral downstream from the saphenous vein graft to the      PDA.  All other grafts are patent.  Normal LV function.  7. Microscopic hematuria.  8. Arthritis.   PAST SURGICAL HISTORY:  Status post CABG in 1996 and status post hernia  repair.   SOCIAL HISTORY:  He is a retired [employer] architect.  He has 3 children.  He  has remote tobacco use.  He quit in 1996.  He denies any alcohol or IV  drug use.   FAMILY HISTORY:  His father died in his 52s of a gunshot wound.  His  mother died at 17 with heart problems.  His brother died at 48 of a  gunshot wound.  He has a son who died of an overdose.   MEDICATIONS:  Caduet and Plavix  75 mg a day.  He was on aspirin but  stopped taking it until today.  Isosorbide t.i.d., Vicodin p.r.n., and  Xanax p.r.n.   REVIEW OF SYSTEMS:  Other than what is in the HPI is negative except for  increased stress at home.   PHYSICAL EXAMINATION:  VITAL SIGNS:  Blood pressure is 120/65, heart  rate 67, O2 saturation 99% on 2 liters.  GENERAL:  He is a well-developed, well-nourished white male in no acute  distress.  HEENT:  Benign.  NECK:  Supple without lymphadenopathy.  Carotid upstrokes +2  bilaterally.  No bruits.  LUNGS:  Clear to auscultation bilaterally.  HEART:  Regular rate and rhythm.  No murmurs, rubs, or gallops.  Normal  S1 and S2.  ABDOMEN:  Soft, obese, nontender, and nondistended with active bowel  sounds.  No hepatosplenomegaly and no abdominal bruits.  EXTREMITIES:  No cyanosis, erythema, or edema.  Posterior tibial pulses  +2 bilaterally.   LABORATORY DATA:  Sodium 137, potassium 3.8, chloride 103, bicarb 26,  BUN 14, creatinine 0.8, glucose 108.  White cell count 9.8, hemoglobin  14, hematocrit 41.8, platelet count 219, MB 2.1.  Troponin less than  0.05.  Myoglobin 78.6.  Chest x-ray is pending.  EKG shows sinus rhythm  and sinus bradycardia, 59 beats per minute  with nonspecific  interventricular conduction delay, nonspecific ST changes.  In  comparison to an EKG from July 2006, there is no change.   ASSESSMENT:  1. Unstable angina pectoris with normal point-of-care and markers x1.      Chest pain is similar to prior angina.  He has had a significant      increase in nitroglycerin use over the past 24 hours.  EKG is      nonischemic.  2. Coronary artery disease status post coronary artery bypass graft.  3. Hypertension.  4. Dyslipidemia.  5. Obstructive sleep apnea.  6. History of microscopic hematuria.  7. Arthritis.   PLAN:  Admit to telemetry bed.  Rule out myocardial infarction with  serial cardiac enzymes, IV nitroglycerin drip, subcu Lovenox per  pharmacy, aspirin, and Plavix.  His wife will bring in his list of his  medications.  We will check a BMP and TSH, BMET and lipids in the  morning, n.p.o. after midnight for possible cath.       Armanda Magic, M.D.  Electronically Signed     TT/MEDQ  D:  07/18/2008  T:  07/18/2008  Job:  [id number]

## 2011-05-11 NOTE — Assessment & Plan Note (Signed)
[facility]                            CARDIOLOGY OFFICE NOTE   NAME:Dustin [name], Dustin [name]                      MRN:          [id number]  DATE:10/24/2008                            DOB:          02/20/1934    [name] presents for followup.  Overall, he is doing well.  He was  hospitalized in July.  At that time, he had placement of a Cypher DES  stent to the body of the saphenous vein graft to the PDA.  He previously  has undergone coronary bypass surgery in 1996 with a LIMA to the LAD,  saphenous vein graft to the second diagonal, saphenous vein graft to the  PDA and sequential saphenous vein graft to the OM1 and 2.  He has had  rare chest pain relieved with nitroglycerin.  He denies any active  symptoms, however.   MEDICATIONS:  1. Xanax 1 mg daily.  2. Caduet 10/20 daily.  3. Plavix 75 mg daily.  4. Aspirin 325 mg daily.  5. Coreg 3.125 mg p.o. b.i.d.   PHYSICAL EXAMINATION:  GENERAL:  He is alert and oriented in no  distress.  VITAL SIGNS:  The weight is 199 pounds which is stable from August, the  blood pressure is 130/70, and the pulse is 53.  LUNG:  Fields are clear.  CARDIAC:  Rhythm is regular.  There was a median sternotomy.  Groins are  unremarkable.   The electrocardiogram demonstrates sinus bradycardia with nonspecific T-  wave abnormality.  A comparison of the previous tracing.  There is  slightly less T-wave inversion in the V4-V5, although the tracing is not  significantly changed from prior.   IMPRESSION:  1. Coronary artery disease, status post percutaneous coronary stenting      of the saphenous vein graft to the distal right coronary artery in      July 2009.  2. Prior coronary artery bypass graft surgery in 1996 as previously      noted.  3. Hypertension.  4. Dyslipidemia.  5. Obstructive sleep apnea.  6. History of myocardial infarction.  7. Arthritis.  8. History of microscopic hematuria followed by Dr.  Aldean Ast.   PLAN:  1. Return to clinic in 3 months.  2. Continue current medical regimen.     [name]. [name], MD, [facility]  Electronically Signed    TDS/MedQ  DD: 12/28/2008  DT: 12/28/2008  Job #: [id number]

## 2011-05-11 NOTE — Assessment & Plan Note (Signed)
[facility]                            CARDIOLOGY OFFICE NOTE   NAME:[name], [name]                      MRN:          [id number]  DATE:02/26/2009                            DOB:          08-13-1934    Dustin [name] is in for a followup visit.  In general, he is stable.  He  does complain of a fair amount of arthritis.  The arthritis has not  gotten necessarily any worse.  He strongly believes it was present prior  to Caduet.  He has been told that his shoulders show a fair amount of  arthritic change.  He also sees Dr. Aldean Ast, and had some blood in his  urine at one time.   His current medications include:  1. Xanax 1 mg daily.  2. Plavix 75 mg daily.  3. Aspirin 325 mg daily.  4. Coreg 3.125 mg p.o. b.i.d.  5. Simvastatin 40 mg daily.  6. Norvasc 10 daily.   On physical, he is alert and oriented, in no distress.  Blood pressure  is 131/73, the pulse is 59.  The lung fields are clear.  The cardiac  rhythm is regular with a soft S4 gallop.  The extremities reveal no  edema.   His electrocardiogram demonstrates normal sinus rhythm.  There are some  lateral T flattening, but no ST-T changes.  They are nonspecific.   IMPRESSION:  1. Coronary artery disease status post percutaneous stenting of      saphenous vein graft.  2. Hypercholesterolemia.  3. Hypertension.  4. Probable degenerative arthritis.  5. Obstructive sleep apnea.  6. History of microscopic hematuria.   RECOMMENDATIONS:  The patient will be seen back in followup in  approximately 4 months.  At that time, we will drop his aspirin to 81  mg.  We potentially could do that now.  Should any problems in the  interim, he is to call us.  I have encouraged him to talk with Dr. [name]  about the possibility of the patient's desire to see a rheumatologist.  We will defer that to them.     [name]. [name], MD, [facility]  Electronically Signed    TDS/MedQ  DD: 02/26/2009  DT: 02/27/2009   Job #: [id number]

## 2011-05-11 NOTE — Discharge Summary (Signed)
NAMEELIJHA, DEDMAN NO.:  [id number]   MEDICAL RECORD NO.:  [id number]          PATIENT TYPE:  INP   LOCATION:  2627                         FACILITY:  [facility]   PHYSICIAN:  [name]. [name], MD, FACCDATE OF BIRTH:  [birth year]   DATE OF ADMISSION:  07/17/2008  DATE OF DISCHARGE:  07/24/2008                               DISCHARGE SUMMARY   PRIMARY CARDIOLOGIST:  Maisie Fus D. [name], MD, [facility]   Primary care physician is not listed.   PROCEDURE PERFORMED DURING HOSPITALIZATION:  1. Cardiac catheterization dated July 19, 2008, completed per Dr.      Shawnie Pons revealing preserved overall LV function, patent SVG      to OM, patent saphenous vein graft to diagonal, patent internal      mammary to LAD, 80% stenosis of the saphenous vein graft to the      PDA.  2. PCI and placement of Cypher DES stent to the body of the SVG to the      PDA.  This is a 3.0 x 33-mm drug-eluting stent completed by Dr.      Verne Carrow and Dr. Shawnie Pons.   FINAL DISCHARGE DIAGNOSES:  1. Coronary artery disease.      a.     Status post coronary artery bypass grafting 1996 left       internal mammary artery to left anterior descending, saphenous       vein graft to second diagonal, saphenous vein graft to posterior       descending artery and sequential saphenous vein graft to obtuse       marginal 1 and  obtuse marginal 2.  2. Status post percutaneous coronary intervention to the saphenous      vein graft using a Cypher drug-eluting stent.  3. Hypertension.  4. Dyslipidemia.  5. Obstructive sleep apnea.  6. History of myocardial infarction.  7. Microscopic hematuria.  8. Arthritis.   HOSPITAL COURSE:  This is a 77 year old Caucasian male with history of  CAD who presented to the emergency room with complaints of chest pain  starting after lunch.  The patient took nitroglycerin over 1 hour with  some improvement of pain.  The pain reoccurred and the patient was  brought to [facility] Emergency Room.  The patient was seen and examined  by Dr. Armanda Magic, cardiologist for Dr. Shawnie Pons admitted to  rule out myocardial infarction in the setting of unstable angina.  The  patient had cardiac enzymes cycled which were found to be normal.  The  patient did undergo cardiac catheterization on July 19, 2008, revealing  an 8% saphenous vein graft stenosis to the PDA.  At that time, the  patient was scheduled for PCI within 2 days.  The patient was also seen  by Dr. Aldean Ast to review hematuria.  The patient was found to be  stable within evaluation.  The patient also was having some difficulty  with withdrawal from Vicodin as he was taking it fairly regularly at  home and became very anxious and shivering and began to have some  withdrawal symptoms during  hospitalization.  The patient was given  Vicodin p.r.n. along with Xanax but the time between doses was once  lengthened out which was not the amount of time he was taking it at  home.  The patient did recover from minor withdrawal symptoms and did  well.  It was noted however that the patient did have a temperature of  101 two days prior to discharge.  Blood cultures were completed.  Urinalysis was completed and chest x-ray was completed to evaluate for  etiology which found all to be negative.  The patient was kept an extra  day to evaluate the etiology of febrile state and he did not have a  reoccurrence of this.  The patient also ambulated with cardiac rehab and  had no further complaints.  The patient did undergo PCI as stated above  and had no complications pre and post procedure and was anxious to  return home.  The patient was seen and examined by Dr. Shawnie Pons on  day of discharge and was found to be stable.  His Zocor, Norvasc which  was given during hospitalization was discontinued and he was continued  on Caduet as he was on at home.  Isosorbide was discontinued.  The  patient was  to be continued on Coreg, aspirin and Plavix as he was  taking at home.  He will resume his Vicodin and Xanax on home doses with  continued follow up with primary care physician.   DISCHARGE LABORATORY DATA:  Blood cultures were negative.  Hemoglobin  13.1, hematocrit 38.9, white blood cells 6.5, platelets and 201.  Sodium  136, potassium 3.8, chloride 103, CO2 27, glucose 121, BUN 10,  creatinine 0.73.  UA negative.  D-dimer 0.54.  chest x-ray dated July 23, 2008, no acute disease post CABG.   DISCHARGE VITAL SIGNS:  Blood pressure 124/74, pulse 76, respirations  17.  The patient was afebrile.   DISCHARGE MEDICATIONS:  1. Plavix 75 mg daily.  2. Aspirin 325 mg daily.  3. Coreg 3.125 mg twice a day.  4. Caduet as taken at home.   ALLERGIES:  No known drug allergies.   FOLLOW-UP PLANS AND APPOINTMENT:  1. The patient is to follow up with Dr. Shawnie Pons on August 20, 2008, at 10:45 a.m. for continued cardiac evaluation and      management.  2. The patient is to follow up with his primary care physician for      continued medical management.  3. The patient was given post cardiac catheterization instructions      with particular emphasis on the right groin site for evidence of      bleeding, hematoma, signs of infection, or severe pain.   Time spent with the patient to include physician time 35 minutes.      Bettey Mare. Lyman Bishop, NP      [name]. [name], MD, [facility]  Electronically Signed    KML/MEDQ  D:  07/24/2008  T:  07/24/2008  Job:  [id number]

## 2011-05-11 NOTE — Assessment & Plan Note (Signed)
[facility]                            CARDIOLOGY OFFICE NOTE   NAME:Dustin [name], Dustin [name]                      MRN:          [id number]  DATE:08/20/2008                            DOB:          Nov 08, 1934    Mr. Mehringer is in for followup visit.  To briefly summarize, Mr. Patriarca  was recently admitted to the hospital.  In general, he underwent  placement of a Cypher drug-eluting stent 30 x 33 mm stent to his  saphenous vein graft.  Since discharge from the hospital, he has been  without significant symptoms.  He actually feels somewhat better.  We  discussed with Dr. Aldean Ast, his microscopic hematuria, and he felt  that the patient was stable to remain on therapy.  He is hemoglobin was  normal at that time.   CURRENT MEDICATIONS:  1. Xanax daily.  2. Caduet 1 daily.  3. Plavix 75 mg daily.  4. Aspirin 325 mg daily.  5. Coreg 3.125.   PHYSICAL EXAMINATION:  VITAL SIGNS:  Today blood pressure 110/70 and  pulse is 49.  LUNG:  Fields are clear.  CARDIAC:  Rhythm is regular.  There is no significant murmur noted.   Electrocardiogram demonstrates fairly marked sinus bradycardia with a  rate of 49 and borderline T abnormality in the inferior and lateral  leads.   To summarize, the patient has done relatively well.  He does have  arthritic complaints, which he says that is prolong and proceeded the  initiation of Caduet.  I did encourage him to discuss his arthritic  complaints, which emanate to almost all his joints with Dr. [name] and I  will forward copy of this office note to Dr. [name] for his record.  We  will see him back in follow up in about 2 months.     [name]. [name], MD, [facility]  Electronically Signed    TDS/MedQ  DD: 08/20/2008  DT: 08/21/2008  Job #: [id number]

## 2011-05-11 NOTE — Letter (Signed)
July 27, 2007    [name], MD  [address]  [place], [place] [address]   RE:  DAVON, ABDELAZIZ  MRN:  [id number]  /  DOB:  1934/10/24   Dear Peyton Najjar:   I had the pleasure of seeing your nice patient, Archit Leger, in the  office today in followup.  In general, he has been getting along  reasonably well.  He has not been having a lot of chest discomfort.  He  previously had chest pain all of the time, occasionally without  worsening findings by angiography.  He clearly does have diffuse  coronary disease and has had prior revascularization surgery.  He has  had an occasional palpitation but no syncope or presyncope.   Today on examination, the blood pressure is 120/70, the pulse is 66.  The carotid upstrokes were brisk.  There was no obvious jugular venous  distention.  The lung fields were clear to auscultation and percussion.  The PMI was nondisplaced and there was normal first and second heart  sound without a murmur or rub.  The abdomen was soft and I could not  appreciate widened pulsations, and the distal pulses were clearly  intact.   Electrocardiogram demonstrated normal sinus rhythm.  There is a leftward-  oriented axis.  There is nonspecific interventricular conduction delay.  When compared to previous tracings, this did not appear to be  substantially changed.   Overall, he is doing well.  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  He asked if he could come off of Plavix, and there is not  a compelling reason at the present time that he would have to remain on  this.  We also agreed that he could down to aspirin 81 mg daily.  He  wanted generics for Caduet, and he told me that he was on the 10/20  pill.  We wrote him a prescription for generic amlodipine 10 mg daily,  and also for simvastatin which he says he has clearly taken previously.  I asked him to get a lipid and liver  profile done in your office in the  next 6 weeks to 8 week.   We will see Mr. Pinn back in year unless there is a change in his  symptoms.  He looks the best that I have seen him really in quite some  time.   Thanks for allowing me to share in his care.    Sincerely,      [name]. [name], MD, [facility]  Electronically Signed    TDS/MedQ  DD: 07/27/2007  DT: 07/28/2007  Job #: [id number]

## 2011-05-11 NOTE — Cardiovascular Report (Signed)
Dustin [name], Dustin [name] NO.:  [id number]   MEDICAL RECORD NO.:  [id number]          PATIENT TYPE:  INP   LOCATION:  6532                         FACILITY:  [facility]   PHYSICIAN:  [name]. [name], MD, FACCDATE OF BIRTH:  [birth year]   DATE OF PROCEDURE:  07/19/2008  DATE OF DISCHARGE:                            CARDIAC CATHETERIZATION   INDICATIONS:  Dustin [name] is a 77 year old gentleman, well known to me.  He has had previous revascularization surgery.  He has presented with  recurrent angina.  The current study was done to assess coronary  anatomy.   PROCEDURE:  1. Left heart catheterization.  2. Selective coronary arteriography.  3. Selective left ventriculography.  4. Saphenous vein graft angiography.  5. Selective left internal mammary angiography.   DESCRIPTION OF PROCEDURE:  The patient was brought to the cath lab and  prepped and draped in the usual fashion.  Through an anterior puncture,  the femoral artery was easily entered.  A 5-French sheath was placed.  Views of the left and right coronary arteries were then obtained in  multiple angiographic projections.  Following this, vein graft  angiography was performed.  Internal mammary angiography was performed.  We went back and did pre- and post-nitroglycerin angiography on the  right saphenous vein graft.  Following this, central aortic and left  ventricular pressures were measured with pigtail.  Ventriculography was  performed in the RAO projection.  I then discussed the case with the  patient in some detail.  We decided to recommend possible percutaneous  intervention of the saphenous vein graft to the RCA, but we elected to  defer this as the lesion appears to be relatively stable, and more  importantly, the patient has a history of microscopic hematuria.  So  that the stent type, and antiplatelet therapy strategies will need to be  discussed with the patient's urologist.  There were no major  complications.  He was taken to the holding area in satisfactory  clinical condition.   HEMODYNAMIC DATA:  1. Central aortic pressure was 125/56, mean 87.  2. Left ventricular pressure 118/80.  3. There was no gradient pullback across the aortic valve.   ANGIOGRAPHIC DATA:  1. The left main is free of critical disease.  2. The LAD courses to the apex.  There is a 50% and then 70% lesion in      the LAD.  The second diagonal comes off between the takeoff of      these 2 lesions, and there is evidence of competitive filling.  The      distal LAD fills via the internal mammary.  The second diagonal      fills via the saphenous vein graft.  3. The left internal mammary to distal LAD appears to be widely patent      and there is a relatively smaller caliber vessel.  4. The saphenous vein graft to the diagonal appears to be widely      patent and fills the LAD in a retrograde fashion.  5. There is a small ramus intermedius that is widely patent.  6.  The circumflex is severely diseased with 90% and a total occlusion.  7. The saphenous vein graft sequentially to 2 branches of the obtuse      marginal is intact.  It appears relatively smooth with minimal      proximal irregularities.  The area between the 2 marginals with      this supplies is severely diseased.  8. The right coronary artery is severely diseased with 90% mid      narrowing than more or less total occlusion in the midvessel with      hang-up of contrast.  9. The saphenous vein graft to the PDA demonstrates about 30% proximal      narrowing.  In the midvessel, there is about a 40-50% area of mid-      narrowing and then a fairly focal 80% stenosis distally.  This      inserts into the PDA.  The PDA fills retrograde into the      posterolateral system.  In the continuation branch into the      posterolateral system, there is about 80% narrowing.  There appears      to be ejection of the saphenous vein graft to the OM, some  filling      into the small posterolateral branch of the right coronary by      retrograde collaterals.  10.The ventriculogram demonstrates preserved overall systolic      function.  There is not a definite wall motion abnormality.      Ejection fraction would be estimated at the low normal range of 50-      55%.   CONCLUSION:  1. Preserved overall LV function.  2. Patent SVG to the OM system.  3. Patent saphenous vein graft to the diagonal.  4. Patent internal mammary to the LAD.  5. An 80% stenosis of the saphenous vein graft to the PDA.   RECOMMENDATIONS:  I plan to discuss the case with Dr. Vic [name].  The patient has had some microscopic hematuria and the patient himself  has been going back and forth between aspirin and Plavix.  He has not  had major problems.  The stent type will be discussed with my colleagues  and I will make recommendation to the patient.  We tentatively have him  set for Monday morning.      [name]. [name], MD, [facility]  Electronically Signed     TDS/MEDQ  D:  07/19/2008  T:  07/19/2008  Job:  [id number]

## 2011-05-14 NOTE — Cardiovascular Report (Signed)
NAMEKEYRON, POKORSKI NO.:  [id number]   MEDICAL RECORD NO.:  [id number]          PATIENT TYPE:  INP   LOCATION:  4708                         FACILITY:  [facility]   PHYSICIAN:  Doylene Canning. Ladona Ridgel, M.D.  DATE OF BIRTH:  1934-09-18   DATE OF PROCEDURE:  01/06/2005  DATE OF DISCHARGE:                              CARDIAC CATHETERIZATION   PROCEDURE PERFORMED:  Left heart catheterization with coronary angiography  and left ventriculography, saphenous vein graft angiography, and left  internal mammary angiography.   INDICATIONS FOR PROCEDURE:  Unstable angina.   I:  INTRODUCTION:  The patient is a 77 year old male with a history of known  coronary artery disease status post bypass grafting in the past.  He  presented to the hospital with unstable anginal symptoms.  He ruled out for  MI.  The patient last underwent catheterization two years ago.  He now is  referred for catheterization.   II:  PROCEDURE:  After informed consent was obtained, the patient was taken  to the diagnostic catheterization lab in a fasting state.  After the usual  preparation and draping, intravenous Fentanyl and Midazolam was given for  sedation.  30 mL of lidocaine was infiltrated into the right femoral region.  The right femoral artery was punctured and the 6 French sheath was advanced  into the right femoral artery.  A left Judkins catheter was then inserted  through the right femoral sheath into the left main coronary artery.  Coronary angiography of the left main system was carried out.  The left  Judkins catheter was removed and the right Judkins catheter was inserted by  way of the right femoral artery sheath into the right coronary.  Coronary  angiography of the right coronary system was carried out.  The right Judkins  catheter was retracted slightly and saphenous vein graft angiography was  carried out with the right Judkins catheter.  The right Judkins catheter was  then removed and a  LIMA catheter was inserted into the aorta and advanced  into the left subclavian artery system.  The LIMA was not selectively  cannulated to the LAD.  Angiography of the LIMA system was then carried out.  At this point, the LIMA catheter was removed and the pigtail catheter was  inserted retrograde across the aortic valve into the left ventricle and left  ventriculography in the RAO projection was carried out with a total of 35 mL  of contrast injected at a rate of 12 mL per second.  At this point, the  catheters were removed and the patient returned to his room in satisfactory  condition.   III:  COMPLICATIONS:  There were no immediate procedure complications.   IV:  RESULTS:  A.  Hemodynamics.  The LV pressure was 140/13, the aortic pressure 140/66.  B.  Left ventriculography.  Left ventriculography performed in the RAO  projection demonstrated an ejection fraction of 55% and no mitral or aortic  regurgitation.  C.  Coronary angiography.  Coronary angiography was carried out  demonstrating a patent left main coronary artery.  There was a 30%  nonobstructive stenosis in the left main.  This gave rise to the LAD and the  left circumflex coronary artery.  The LAD had an 80% stenosis after the  first diagonal branch.  The left circumflex coronary artery was occluded.  The right coronary artery was a dominant vessel giving rise to the PDA and  the PLSA branch.  The right coronary artery had a 90% stenosis in the mid  portion and was occluded distally.  The LIMA to the LAD was demonstrated to  be atretic, there was no obvious stenosis, however.  The saphenous vein  graft to the PDA was patent.  The PLSA branch had a 90% stenosis.  The  vessel was somewhat small in size, however.  The saphenous vein graft to the  first and second obtuse marginal branches were patent.  There was severe  native vessel disease in the distal left circumflex system as well as in the  obtuse marginal system.  The  saphenous vein graft to the first diagonal  branch was widely patent and free of significant disease.  The first  diagonal branch was a large vessel supplying the anterolateral wall of the  left ventricle.   V:  CONCLUSION:  This study demonstrates severe native three vessel coronary artery disease  with patent saphenous vein graft to the PDA, patent saphenous vein graft to  the obtuse marginal, and patent saphenous vein graft to the diagonal  branches.  It also demonstrates patent but atretic LIMA to the LAD.  There  was a 90% PLSA stenosis down stream of the saphenous vein graft to the PDA  which could certainly be causing the patient's symptoms.  Overall, the  patient's LV function was preserved.   PLAN:  Discuss percutaneous coronary intervention with Dr. [name] versus  medical therapy of the patient's unstable angina.       GWT/MEDQ  D:  01/06/2005  T:  01/06/2005  Job:  [id number]   cc:   [name], MD  [address]  [place]  [place] [address]  Fax: [phone]   Oneal Deputy. Juanetta Gosling, M.D.  [address]  [place]  [place] [address]  Fax: [phone]

## 2011-05-14 NOTE — Cardiovascular Report (Signed)
NAME:  Dustin [name], Dustin [name] NO.:  [id number]   MEDICAL RECORD NO.:  [id number]                   PATIENT TYPE:  INP   LOCATION:  2005                                 FACILITY:  [facility]   PHYSICIAN:  Jonelle Sidle, M.D. [facility]        DATE OF BIRTH:  [birth year]   DATE OF PROCEDURE:  01/10/2003  DATE OF DISCHARGE:                              CARDIAC CATHETERIZATION   PROCEDURES PERFORMED:  1. Left heart catheterization.  2. Selective coronary angiography.  3. Left ventriculography.  4. Bypass graft angiography.   CARDIOLOGIST:  Jonelle Sidle, M.D.   Corinda Gubler CARDIOLOGISTArturo Morton. [name], M.D.   INDICATIONS:  The patient is a 77 year old male with known coronary artery  disease status post coronary artery bypass grafting in 1996 with a LIMA to  the LAD, saphenous vein graft to the diagonal, saphenous vein graft to the  obtuse marginal I and obtuse marginal II, as well as saphenous vein graft to  the posterior descending.  He presents now with a one to two month history  of fatigue, dyspnea on exertion and intermittent chest discomfort.  This was  requested to define the coronary anatomy.   ACCESS AND EQUIPMENT:  The area about the right femoral artery was  anesthetized with 1% lidocaine and a 6 French sheath was placed in the right  femoral artery via the modified Seldinger technique.  Standard preformed 6  [place] and JR5 catheters were used for selective coronary angiography  and vein graft angiography.  An IMA catheter was used for injection of the  left internal mammary artery.  An angled pigtail catheter was used for left  heart catheterization and left ventriculography.  All exchanges were made  over a wire, and the patient tolerated the procedure well without immediate  complications.   FINDINGS:  HEMODYNAMICS:  1. Left ventricle 105/14 mmHg  post angiography.  2. Aorta 104/58 mmHg.   ANGIOGRAPHIC FINDINGS  1. Left main coronary  artery has a 30% mid stenosis.  2. The left anterior descending has a 40% proximal stenosis followed closely     by a 70% stenosis just prior to the takeoff of the first diagonal branch,     which is large and bifurcating.  There has been an 80% mid vessel     stenosis, and the remainder of the distal left anterior descending is     small and probably diffusely diseased.  3. The circumflex coronary artery is diffusely diseased with an 80% proximal     stenosis, 75% mid vessels stenosis, and an 85% distal stenosis.  There     are two obtuse marginal branches noted that are small and diffusely     diseased.  4. The right coronary artery is occluded in the mid segment following a     heavily diseased proximal portion.  It also appears that the distal     vessel is occluded.  The  vein graft to the posterior descending branch     fills this branch and the posterior lateral system but does not back fill     up the right coronary artery.  5. The saphenous vein graft to the posterior descending branch is patent     with minor irregularities.  6. The saphenous vein graft to the obtuse marginal I and obtuse marginal II     is patent.  7. The saphenous vein graft to the first diagonal branch is patent.  8. The left internal mammary artery graft to the left anterior descending is     patent.  The distal runoff shows competitive flow.  This is a small     distribution as noted.   LEFT VENTRICULOGRAPHY:  Left ventriculography was performed in the RAO  projection revealed an ejection fraction of approximately 60% with inferior  basal hypokinesis as noted previously.  No significant mitral regurgitation  is noted.   DIAGNOSES:  1. Multivessel coronary artery disease as outlined.  2. Patent bypass grafts as outlined.  3. Left ventricular ejection fraction of approximately 60%.   RECOMMENDATIONS:  Would continue medical therapy at this point.                                               Jonelle Sidle, M.D. [facility]    SGM/MEDQ  D:  01/10/2003  T:  01/10/2003  Job:  [id number]

## 2011-05-14 NOTE — Discharge Summary (Signed)
NAMEKESHON, MARKOVITZ NO.:  [id number]   MEDICAL RECORD NO.:  [id number]          PATIENT TYPE:  INP   LOCATION:  4708                         FACILITY:  [facility]   PHYSICIAN:  Beltrami Bing, M.D.  DATE OF BIRTH:  1934/10/13   DATE OF ADMISSION:  01/05/2005  DATE OF DISCHARGE:                           DISCHARGE SUMMARY - REFERRING   SUMMARY OF HISTORY:  [name] is a 77 year old white male who was  transferred via [facility] from [facility] secondary to chest  discomfort.  He stated that yesterday while shopping at the mall he suddenly  developed a choking pressure sensation in his throat radiating to his left  shoulder and arm.  This was associated with chest numbness. He did not have  any increase of his chronic shortness of breath, nausea, vomiting, or  diaphoresis.  He took a sublingual nitroglycerin reducing his discomfort  from4 to 2.  He went home and relaxed and ate lunch, although the chest  discomfort was still there.  He took a second nitroglycerin at approximately  7 o'clock and at 1 a.m., again reducing the discomfort from 4 to 2.  He was  unable to sleep, and the following morning, the discomfort has persisted. He  called the office to make an appointment with Dr. [name], and he was  referred to the ER, and his wife made him go.  The discomfort has not been  a zero since initial onset.  In the emergency room, he received IV heparin  and nitroglycerin without any relief.  It is also notable he has been  exercising 3-4 times a week without difficulty, using a treadmill and a  different machine.  He was exercising previously prior to shopping at the  mall without difficulty.   His history is notable for hypertension, hyperlipidemia, sleep apnea;  however, he does not use his CPAP.  He supposedly has a history of  myocardial infarction, although specific documentation is not available.  He  underwent bypass surgery in 1996 with a LIMA to  the LAD, saphenous vein  graft to diagonal 2, saphenous vein graft to the PDA, and sequential  saphenous vein graft to the OM-1 and OM-2.  His last catheterization in  January, 2004 showed patent grafts and an EF of 60%.  Last Adenosine  Cardiolite was in February, 2004 for continuing chest discomfort which did  show some abnormalities but with his recent catheterization and comparing to  a prior study of March 29, 2001 that did not show any significant changes, he  was treated medically.  He also has a history of remote tobacco use.   LABORATORY DATA:  Admission H&H was 14.6 and 42,6, normal indices, platelets  280,000, WBC 8.4.  Subsequent hematologies were unremarkable.  Admission PT  was 13.3, PTT 86.  Sodium 135, potassium 3.7, BUN 11, creatinine 0.8,  glucose 86.  Three CK-MBs and troponins were negative for myocardial  infarction.  Fasting lipids showed a total cholesterol of 137, triglycerides  126, HDL low at 34, LDL at 74.  Chest x-ray did not show any active disease.  EKGs showed normal sinus rhythm, left axis deviation, nonspecific ST-T wave  changes, some lateral ST flattening.   HOSPITAL COURSE:  Mr. Juncaj was admitted to 4700 and continued on IV  heparin and nitroglycerin.  Overnight, his discomfort had resolved and EKGs  were negative for myocardial infarction.  Dr. [name] felt that he should  undergo recatheterization.  This was performed on January 06, 2005 without  difficulty.  According to Dr. Lubertha Basque progress note in regards to his  catheterization, he had native three-vessel coronary artery disease, and his  grafts were patent with preserved LV function, although it was noted that  his LIMA to the LAD was atretic, and he had a 90% PL branch that was not  bypassed.  Dr. Ladona Ridgel reviewed with Dr. [name], and it was felt that his PL  branch was not approachable for intervention, and medical treatment should  be continued.  Nursing noted on the evening of January 06, 2005, that the  patient was taking his home medication Xanax.  Dr. [name] reviewed on  January 07, 2005 and felt that the patient could be discharged home.   DISCHARGE DIAGNOSES:  1.  Somewhat atypical chest discomfort.  2.  Medical treatment for known coronary artery disease as previously      described.  3.  Hyperlipidemia.  4.  History as previously.   DISPOSITION:  He is discharged home.  He did received new prescriptions for  Plavix 75 mg daily and Protonix 40 mg daily.  He was asked to continue  Altace 10 mg b.i.d., Lipitor 20 mg q.h.s., aspirin 81 daily, Imdur 90 daily,  nitroglycerin 0.4 as needed, FiberCon, and Vicodin as previously.  He was  instructed to bring all medications to all appointment.  He was advised no  lifting, driving, sexual activity, or heavy exertion for three days.  Maintain low-salt, fat, and cholesterol diet.  If he has any problems with  his catheterization site, he is asked to call.  He was asked to arrange an  appointment with Dr. [name] and to consider outpatient GI evaluation for  reoccurring chest discomfort.  He will follow up with Dr. [name] on  February 6 at 11:15.  At that time, he will have a stress test with Dr.  [name].      Emil   EW/MEDQ  D:  01/07/2005  T:  01/07/2005  Job:  [id number]   cc:   [name], MD  [address]  [place]  [place] [address]  Fax: [phone]   Denham Mose. [name], M.D. [facility]

## 2011-05-14 NOTE — Procedures (Signed)
   NAME:  Dustin [name], Dustin [name]                         ACCOUNT NO.:  [id number]   MEDICAL RECORD NO.:  [id number]                   PATIENT TYPE:  OUT   LOCATION:  RAD                                  FACILITY:  [facility]   PHYSICIAN:  Edward L. Juanetta Gosling, M.D.             DATE OF BIRTH:  [birth year]   DATE OF PROCEDURE:  03/25/2003  DATE OF DISCHARGE:                              PULMONARY FUNCTION TEST   PULMONARY FUNCTION TESTS INTERPRETATION:  1. Spirometry shows a mild ventilatory defect with air flow obstruction at     the level of the smaller airways.  2. Lung volumes show mild restrictive change.  3. Diffusion capacity of carbon monoxide is mildly reduced.  4. There is no significant bronchodilator effect.                                                 Edward L. Juanetta Gosling, M.D.    ELH/MEDQ  D:  03/25/2003  T:  03/26/2003  Job:  [id number]

## 2011-05-14 NOTE — H&P (Signed)
NAME:  Dustin [name], Dustin [name]                         ACCOUNT NO.:  [id number]   MEDICAL RECORD NO.:  [id number]                   PATIENT TYPE:  OUT   LOCATION:  RAD                                  FACILITY:  [facility]   PHYSICIAN:  Dalia Heading, M.D.               DATE OF BIRTH:  10-14-1934   DATE OF ADMISSION:  DATE OF DISCHARGE:                                HISTORY & PHYSICAL   CHIEF COMPLAINT:  Need for screening colonoscopy.   HISTORY OF PRESENT ILLNESS:  The patient is a 77 year old white male who is  referred for an endoscopic evaluation.  He needs a colonoscopy for screening  purposes.  No abdominal pain, weight loss, nausea, vomiting, diarrhea,  melena, or hematochezia have been noted.  Occasional constipation is noted.  He has never had a colonoscopy.  There is no family history of colon  carcinoma.  No history of hemorrhoidal disease.   PAST MEDICAL HISTORY:  1. High cholesterol levels.  2. Hypertension.  3. Coronary artery disease.   PAST SURGICAL HISTORY:  1. Heart bypass.  2. Herniorrhaphy.   CURRENT MEDICATIONS:  1. Lipitor.  2. _________.  3. Altace.  4. Aspirin.  5. Vicodin.   ALLERGIES:  No known drug allergies.   REVIEW OF SYSTEMS:  Noncontributory.   PHYSICAL EXAMINATION:  GENERAL:  The patient is a well-developed, well-  nourished white male in no acute distress.  VITAL SIGNS:  He is afebrile, vital signs are stable.  LUNGS:  Clear to auscultation with equal breath sounds bilaterally.  HEART:  Regular rate and rhythm without S3, S4, murmurs.  ABDOMEN:  Soft, nontender, nondistended, no hepatosplenomegaly or masses are  noted.  RECTAL:  Deferred to the procedure.   IMPRESSION:  Need for screening colonoscopy.   PLAN:  The patient is scheduled for a colonoscopy on August 03, 2004.  The  risks and benefits of the procedure including bleeding and perforation were  fully explained to the patient, who gave informed consent.     ___________________________________________                                         Dalia Heading, M.D.   MAJ/MEDQ  D:  07/30/2004  T:  07/30/2004  Job:  [id number]   cc:   [name], M.D.  [address]  [place]  [place] [address]  Fax: [phone]

## 2011-05-14 NOTE — H&P (Signed)
NAMECHUNG, Dustin NO.:  [name]   MEDICAL RECORD NO.:  [id number]          PATIENT TYPE:  INP   LOCATION:  4708                         FACILITY:  [facility]   PHYSICIAN:  Arvilla Meres, M.D. LHCDATE OF BIRTH:  Nov 03, 1934   DATE OF ADMISSION:  01/05/2005  DATE OF DISCHARGE:                                HISTORY & PHYSICAL   PRIMARY CARE PHYSICIAN:  [name], M.D.   PRIMARY CARDIOLOGIST:  [name]. [name], M.D.   SUMMARY OF HISTORY:  Dustin [name] is a 77 year old white male who was  transferred via [facility] from [facility] ER secondary to chest discomfort.  Dustin [name] stated that yesterday while shopping at the mall, he suddenly  developed a choking pressure-like sensation in his throat radiating to his  left arm and left shoulder.  This was associated with chest numbness.  He  denied any associated nausea, vomiting, diaphoresis, or increased shortness  of breath.  He took a sublingual nitroglycerin in the mall, and this reduced  his discomfort from a 4 to a 2.  He went home and relaxed, ate lunch;  however, his discomfort persisted.  Throughout the evening and night, he  took an additional two sublingual nitroglycerin at approximately 7 p.m. and  1 a.m., again reducing the discomfort from 4 to 2 on a scale of 0-10.  He  was unable to sleep throughout the night, and the discomfort has persisted  throughout today.  He called the office to set up an appointment to see Dr.  [name];  however, he was referred to the emergency room and  his wife made  him go.  In the emergency room, despite IV heparin and  nitroglycerin, he  has continued to have his chest discomfort.  He gives it a 1 on a scale of 0-  10 at this time.  He states that since the first of the year, he has been  trying to exercise regularly. In fact, he had exercised yesterday morning  without difficulty.   PAST MEDICAL HISTORY:  No known drug allergies, although in the past it is  reported that he had bradycardia associated with beta blockers.   MEDICATIONS PRIOR TO ADMISSION:  1.  Altace 10 mg b.i.d.  2.  Imdur 90 mg daily.  3.  Lipitor 20 mg q.h.s.  4.  Aspirin 81 mg daily.  5.  Vicodin p.r.n.  6.  FiberCon p.r.n.   His history is notable for hypertension, hyperlipidemia, sleep apnea and  prescribed a CPAP;  however, he does no use this.  He states that the cure  is worse than the illness.  In 1996, he reportedly had a myocardial  infarction and underwent five-vessel bypass surgery with a LIMA to the LAD,  saphenous vein graft to the diagonal 2, saphenous vein graft to the PDA,  saphenous vein graft to the OM-1 and OM-2.  Last catheterization was  January, 2004, and reported native three-vessel coronary artery disease with  grafts, and an EF of 60%.  His last Adenosine Cardiolite was in 2004 and did  not feel that there was  any significant change between a Cardiolite on March 29, 2001, despite abnormalities.  He denies any history of diabetes, CVA,  COPD, bleeding dyscrasias, or thyroid dysfunction.   SOCIAL HISTORY:  He resides in [place] with his wife. He is a retired  Char[employer] fundraiser. He has three children.  He feels he is under an  inordinate amount of stress because some his children have some drug  problems.  He quit smoking in 1996.  Prior to that, he was smoking three  packs per day for 45 years.  He denies any alcohol, drug usage, or herbal  medication usage.  He states he is trying to improve his diet.  When he goes  to have a sausage biscuit at [employer], he blots the biscuit with his  napkin to get rid of the excess grease.  He has recently begun using the  treadmill and a horse type machine 3-4 times a week.   FAMILY HISTORY:  His mother died at the age of 29 with heart problems,  status post redo bypass surgery.  His father died in his 24s from a gunshot  wound.  He has one brother alive, age 63, one brother deceased at the age of  9  of gunshot wound.   REVIEW OF SYSTEMS:  Notable for a 45-pound weight gain in the last 10 years;  however, he states within the last year, he has lost 15 pounds.  Reading  glasses, hearing loss, bad teeth and he needs to see a dentist.  Chronic  orthopnea to the point that he sleeps in a recliner, occasional  lightheadedness associated with standing today.  Blood pressure was 95/62.  Urinary frequency, nocturia.  Depression and anxiety.  Numbness in his chest  and feet.  Increased family stresses.  Arthralgias in his hands, back, and  shoulder.  Constipation and dysphagia.   PHYSICAL EXAMINATION:  GENERAL:  Well-nourished, well-developed, slightly  obese white male in no apparent distress.  VITAL SIGNS:  Temperature 97, blood pressure 138/81, pulse 73, respirations  20, weight 199.2, 98% saturations on range of motion air.  HEENT:  Unremarkable except for poor dentition and hard of hearing.  NECK:  Supple without thyromegaly, adenopathy, JVD, or carotid bruits.  HEART:  Distant heart sounds, regular rate and rhythm, without murmurs,  rubs, clicks, or gallops.  All pulses are symmetrical and intact without  carotid, abdominal, or femoral bruits.  LUNGS:  Symmetrical excursion, decreased breath sounds but clear to  auscultation.  SKIN:  Intact without lesions.  ABDOMEN:  Obese, bowel sounds present, soft, slight tenderness in the right  lower quadrant without organomegaly or masses.  EXTREMITIES:  Negative cyanosis, clubbing, or edema.  MUSCULOSKELETAL:  Unremarkable.  NEUROLOGIC:  Unremarkable.   LABORATORY DATA:  Chest x-ray at [facility] showed no active disease. An EKG  showed normal sinus rhythm, incomplete bundle-branch block. It is uncertain  if this is new or not, as we have no old EKGs to compare it to.  Hemoglobin  14.6, hematocrit 42.6, normal indices, platelets 280,000, WBC 8.4.  Sodium  135, potassium 3.7, BUN 11, creatinine 0.8, glucose 86.  IMPRESSION:  Atypical  prolonged chest discomfort with a negative  electrocardiogram and two emergency room markers at [facility].  His per  past medical history.   PLAN:  Admit the patient to [facility], rule out myocardial  infarction by serial enzymes, will start Protonix, and check a PT/PTT.  Dr.  Gala Romney saw the patient, reviewed  his history, and examined the patient,  and  agrees with above.  Given the previous abnormalities on his Cardiolite, Dr.  Gala Romney will proceed with cardiac catheterization and continue history IV  heparin and nitroglycerin that were started at [facility].  If the  catheterization is unremarkable, he should undergo an outpatient  gastrointestinal evaluation.      EW/MEDQ  D:  01/05/2005  T:  01/05/2005  Job:  [id number]   cc:   [name], MD  [address]  [place]  [place] [address]  Fax: [phone]   Lelynd Poer. [name], M.D. [facility]

## 2011-05-14 NOTE — Procedures (Signed)
NAME:  Dustin [name], Dustin [name] NO.:  [id number]   MEDICAL RECORD NO.:  [id number]          PATIENT TYPE:  OUT   LOCATION:  RAD                           FACILITY:  [facility]   PHYSICIAN:  Pricilla Riffle, M.D.    DATE OF BIRTH:  1934-04-23   DATE OF PROCEDURE:  01/28/2005  DATE OF DISCHARGE:                                  ECHOCARDIOGRAM   REFERRING PHYSICIAN:  Simonne Maffucci, M.D.   TEST INDICATION:  Patient is a 77 year old gentleman with a history of  coronary artery disease complaining of shortness of breath, chest pain.  Test to evaluate LV function.   2-D ECHOCARDIOGRAM WITH ECHOCARDIOGRAM DOPPLER:  Left ventricular is normal  in size with an end-diastolic dimension of 46 mm.  The intraventricular  septum and posterior wall are mildly thickened at 14 and 12 mm each  consistent with mild LVH.  Left atrium is grossly normal.  Right atrium is  mildly dilated.  Right ventricle is grossly normal.   The aortic valve is mild to moderately sclerotic.  There is no  insufficiency.  No significant stenosis.  Mitral valve is grossly normal  with trivial insufficiency.  Pulmonic valve is normal with no insufficiency.  Tricuspid valve is normal with trivial insufficiency less than one out of  three.   Overall LV systolic function is normal with an LV EF of 55%.  There is mild  diastolic dysfunction noted.  RV systolic function is normal.   No pericardial effusion is seen.      PVR/MEDQ  D:  01/28/2005  T:  01/28/2005  Job:  [id number]   cc:   Simonne Maffucci, M.D.

## 2011-05-14 NOTE — Discharge Summary (Signed)
NAME:  ESCO, JOSLYN                         ACCOUNT NO.:  [id number]   MEDICAL RECORD NO.:  [id number]                   PATIENT TYPE:  INP   LOCATION:  2005                                 FACILITY:  [facility]   PHYSICIAN:  Cecil Cranker, M.D. [facility]         DATE OF BIRTH:  10-Sep-1934   DATE OF ADMISSION:  01/09/2003  DATE OF DISCHARGE:  01/11/2003                           DISCHARGE SUMMARY - REFERRING   HISTORY OF PRESENT ILLNESS:  The patient is a 77 year old male with a long  history of coronary artery disease.  He underwent coronary artery bypass  graft surgery in 1996, performed by Dr. Kathlee Nations Trigt.  He was recently  seen in the [facility] office in [place] on January 09, 2003 by Dr. Cecil Cranker and was found to have symptoms consistent with  progressive angina.  Arrangements were made to admit the patient to [facility] for cardiac catheterization.   PAST MEDICAL HISTORY:  The patient is status post coronary artery bypass  graft surgery in 1996; he had a LIMA to the LAD, a saphenous vein graft to  the second diagonal, sequential saphenous vein graft to the first and second  obtuse marginals and a saphenous vein graft to the PDA.  His ejection  fraction was estimated to be 60% at time of his last catheterization in  2000.  The patient also has a history of elevated lipids, history of remote  hernia repair and history of bradycardia secondary to beta blockers, which  was symptomatic.   ALLERGIES:  No known drug allergies.   HOSPITAL COURSE:  As noted, this patient was admitted to [facility]  on January 09, 2003 for anginal symptoms with known coronary artery disease.  The patient underwent cardiac catheterization on January 10, 2003, performed  by Dr. Jonelle Sidle.  This showed multi-vessel native coronary artery  disease with patent grafts.  Ejection fraction was estimated to be 60%.  Please see Dr. Ival Bible full  dictated cardiac catheterization report for  complete details.  Medical therapy was felt to be indicated.  Arrangements  are being made to discharge the patient home, however, his chest x-ray was  mildly abnormal.  A limited CT scan was recommended to follow up on the  chest x-ray.  The limited CT scan was performed January 11, 2003.  This  showed atelectasis but no mass.  The patient did have some biliary sludge  and multiple small gallstones noted as well.  Arrangements were made to  discharge the patient home later that day in improved and stable condition.   LABORATORY DATA:  Please see results of CT scan of chest as noted above.  The patient's chest x-ray showed an ill-defined opacity over the left heart,  ? atelectasis.  Limited CT confirmed atelectasis.   A CBC on the 15th showed hemoglobin 15.4, hematocrit 45.5, WBC 20,300,  platelets 203,000.  Chemistry profile  on January 09, 2003 showed BUN 22,  creatinine 0.8, potassium 4, sodium 133, glucose 212.  A PT and PTT at the  time of admission were within normal limits.  Cardiac enzymes were negative.  A C-reactive protein was 0.4, which was within normal range.  Cholesterol  profile revealed cholesterol 199, triglycerides 109, HDL 57, LDL 120.   DISCHARGE MEDICATIONS:  1. Lipitor 10 mg daily; this may need to be increased.  2. Altace 10 mg daily.  3. Imdur 60 mg was increased to 90 mg daily.  4. Xanax p.r.n. as previously taken.  5. Coated aspirin 325 mg daily.  6. Lortab as previously taken.  7. Hydrochlorothiazide 25 mg daily.  8. Nitroglycerin p.r.n.  9. Protonix 40 mg daily; the Protonix was added this admission.   ACTIVITY:  The patient was told to avoid any strenuous activity or driving  for at least two days.   DIET:  He was to be on a low-salt, low-fat diet.   SPECIAL DISCHARGE INSTRUCTIONS:  He was told to call the office if he had  any increased pain, swelling or bleeding from his groin.   FOLLOWUP:  He is to  follow up with Dr. [name]. Stuckey's physician  assistant January 21st at 11 a.m.  He was told to call Dr. [name]. Fusco  for a followup appointment.   PROBLEM LIST AT TIME OF DISCHARGE:  1. Known coronary artery disease with chest pain, myocardial infarction     ruled out.  2. Cardiac catheterization, January 10, 2003, showing multi-vessel native     coronary artery disease with patent grafts, ejection fraction of 60%,     medical therapy.  3. History of coronary artery bypass graft surgery in 1996, grafts as noted     above.  4. History of elevated lipids; please see cholesterol profile as noted     above.  The patient's Lipitor may need to be increased.  5. Remote tobacco history.  6. Hypertension history.  7. Elevated white blood cell count during this admission.  The patient     recently had a sinus infection and had been treated with steroids; he     also had an infection in his left eye.  The elevated white count was felt     to be secondary to these issues.  8. Mildly elevated glucose -- may need further followup.  9. History of dyspnea.  10.      Abnormal chest x-ray with limited CT scan confirming atelectasis     but also revealing biliary sludge and gallstones.     Delton See, P.A. [facility]                  E. Graceann Congress, M.D. [facility]    DR/MEDQ  D:  01/11/2003  T:  01/12/2003  Job:  [id number]   cc:   [name], M.D.  [address]  [place]  [place] [address]  Fax: [phone]   Bertin Inabinet. [name], M.D. [facility]  [address]  [place]  [place] [address]  Fax: [phone]

## 2011-07-26 ENCOUNTER — Encounter: Payer: Self-pay | Admitting: Cardiology

## 2011-07-26 ENCOUNTER — Ambulatory Visit (INDEPENDENT_AMBULATORY_CARE_PROVIDER_SITE_OTHER): Payer: No Typology Code available for payment source | Admitting: Cardiology

## 2011-07-26 DIAGNOSIS — I25810 Atherosclerosis of coronary artery bypass graft(s) without angina pectoris: Secondary | ICD-10-CM

## 2011-07-26 DIAGNOSIS — E785 Hyperlipidemia, unspecified: Secondary | ICD-10-CM

## 2011-07-26 DIAGNOSIS — I2510 Atherosclerotic heart disease of native coronary artery without angina pectoris: Secondary | ICD-10-CM

## 2011-07-26 DIAGNOSIS — R079 Chest pain, unspecified: Secondary | ICD-10-CM

## 2011-07-26 DIAGNOSIS — I10 Essential (primary) hypertension: Secondary | ICD-10-CM

## 2011-07-26 NOTE — Assessment & Plan Note (Signed)
On prava at present.

## 2011-07-26 NOTE — Assessment & Plan Note (Signed)
Controlled.  

## 2011-07-26 NOTE — Assessment & Plan Note (Signed)
Has had cath a year ago with some progression of disease as noted in the note.  Would favor cath given symptoms, but patient has chosen to proceed.. Risks and alternatives extensively discussed in the office and he consents to proceed.

## 2011-07-26 NOTE — Patient Instructions (Signed)
Your physician recommends that you schedule a follow-up appointment in: 2 weeks with Dr. [name]  Your physician has requested that you have a cardiac catheterization. Cardiac catheterization is used to diagnose and/or treat various heart conditions. Doctors may recommend this procedure for a number of different reasons. The most common reason is to evaluate chest pain. Chest pain can be a symptom of coronary artery disease (CAD), and cardiac catheterization can show whether plaque is narrowing or blocking your heart's arteries. This procedure is also used to evaluate the valves, as well as measure the blood flow and oxygen levels in different parts of your heart. For further information please visit [email or web address] Please follow instruction sheet, as given.

## 2011-07-26 NOTE — Progress Notes (Signed)
HPI:  He has had a couple more episodes of chest pain, relieved by NTG.  That happened a couple of weeks ago. His wife would prefer that he have another cath, and the patient has been on the fence.  No rest symptoms.  Pain was non radiating, and releived with NTG.  No progressive symptoms.  Last cath data is as noted:  He blames pinto beans for the symptoms, but his wife seems to think it was real angina.  March 21, 2010 ANGIOGRAPHIC DATA:   1. Ventriculography was done in the RAO projection.  There was minimal-       to-mild global hypokinesis with estimated ejection fraction in the       50-55% range.  No definite wall motion abnormality was seen.   2. The left main demonstrates about 30% narrowing in the midportion.       It then opens up.   3. The LAD courses to the apex.  The LAD provides a major diagonal       branch, which demonstrates reflux contrast into the saphenous vein       graft.  The saphenous vein graft to this diagonal branch was also       widely patent.  There does not appear to be critical disease       between the ostium of the left main and into the diagonal segment       that would limit flow.  The LAD has fairly high-grade disease after       the takeoff of the diagonal with about an 80% lesion.  There is       competitive flow to the distal LAD.  The internal mammary to the       distal LAD is patent with good antegrade flow.   4. There is a diagonal which comes off proximally from the LAD without       critical narrowing.  There was a ramus intermedius also without       critical narrowing with mild luminal irregularity.   5. The circumflex proper is totally occluded.   6. There was a saphenous vein graft that goes to two limbs of the       circumflex.  There was about 30% narrowing in this graft.  The       first touchdown is widely patent.  The second touchdown       demonstrates about 30-40% diffuse narrowing in the distal portion       of the graft to the  second segment, it attaches to the marginal       which is occluded after its insertion, it does fill retrograde into       that system.   7. The right coronary artery is severely diseased with subtotal       occlusion its midportion and slow flow distally.   8. The saphenous vein graft has been previously stented.  There was a       new progressive area of about 50% narrowing in the midportion of       the graft.  It is slightly hypodense, but in no view exceeds 50%       luminal reduction.  More distally, the stent is widely patent from       previous intervention.  There was about 30% narrowing at the distal       end.  The stented portion of the graft inserts nicely into the PDA.  The PDA fills the RCA system both antegrade and retrograde.  The       PDA itself is widely patent.  The continuation branch, accessed in       a retrograde manner from the vein graft has about 80% narrowing       going into the posterolateral segment.  This is not an easily       accessible percutaneously.      CONCLUSIONS:   1. Preserved overall LV function with low normal ejection fraction.   2. Continued patency of the internal mammary to the LAD.   3. Continued patency of the saphenous vein graft to the diagonal.   4. Continued patency of the saphenous vein graft to two branches of       the marginal with findings as noted above.   5. Continued patency of the previous stent site with progression of       disease in the proximal vessel, but no more than 50%.      DISPOSITION:  This is a difficult decision.  There were some new changes   in the saphenous vein graft, which are bit worrisome, but the lesion   itself does not appear hemodynamically significant.  I have plan to   review extensively with my colleagues before making a recommendation for   percutaneous intervention.               [name]. [name], MD, [facility]         Current Outpatient Prescriptions  Medication Sig Dispense  Refill  . aspirin 81 MG tablet Take 81 mg by mouth daily.        . carvedilol (COREG) 3.125 MG tablet Take 3.125 mg by mouth 2 (two) times daily with a meal.        . clopidogrel (PLAVIX) 75 MG tablet Take 75 mg by mouth daily.        [name] donepezil (ARICEPT) 10 MG tablet Take 10 mg by mouth at bedtime as needed.        . fentaNYL (DURAGESIC - DOSED MCG/HR) 75 MCG/HR Change patch every other day      . hydrochlorothiazide (,MICROZIDE/HYDRODIURIL,) 12.5 MG capsule       . HYDROcodone-acetaminophen (NORCO) 10-325 MG per tablet Take 1 tablet by mouth every 6 (six) hours as needed.        [name] NAMENDA 5 MG tablet daily.       . nitroGLYCERIN (NITROSTAT) 0.4 MG SL tablet Place 0.4 mg under the tongue every 5 (five) minutes as needed.        [name] oxyCODONE (ROXICODONE) 15 MG immediate release tablet       . pravastatin (PRAVACHOL) 40 MG tablet Take 40 mg by mouth daily.        . traMADol (ULTRAM) 50 MG tablet As needed      . ZOSTAVAX 54098 UNT/0.65ML injection         No Known Allergies  Past Medical History  Diagnosis Date  . Arthritis   . CAD (coronary artery disease)   . Hyperlipidemia   . HTN (hypertension)   . MI (myocardial infarction)   . Hematuria   . OSA (obstructive sleep apnea)   . Dementia     Past Surgical History  Procedure Date  . Coronary artery bypass graft     1996 x5:   LIMA to LAD, SVG D2, SVG to PDA, SVG to OM1,2  . Hernia repair     Family History  Problem Relation Age of Onset  . Heart disease      History   Social History  . Marital Status: Married    Spouse Name: N/A    Number of Children: N/A  . Years of Education: N/A   Occupational History  . Not on file.   Social History Main Topics  . Smoking status: Former Smoker    Types: Cigarettes    Quit date: 12/27/1994  . Smokeless tobacco: Not on file  . Alcohol Use: No  . Drug Use: No  . Sexually Active: Not on file   Other Topics Concern  . Not on file   Social History Narrative  . No  narrative on file    ROS: Please see the HPI.  All other systems reviewed and negative.  PHYSICAL EXAM:  BP 131/62  Pulse 51  Ht 5\' 10"  (1.778 m)  Wt 198 lb (89.812 kg)  BMI 28.41 kg/m2  General: Well developed, well nourished, in no acute distress. Head:  Normocephalic and atraumatic. Neck: no JVD Lungs: Clear to auscultation and percussion. Heart: Normal S1 and S2.  No murmur, rubs or gallops.  Abdomen:  Normal bowel sounds; soft; non tender; no organomegaly Pulses: Pulses normal in all 4 extremities. Extremities: No clubbing or cyanosis. No edema. Neurologic: Alert and oriented x 3.  EKG: SB.  Non specific IVCD.  Non specific ST and T changes, cannot exclude ischemia.   ASSESSMENT AND PLAN:

## 2011-07-27 LAB — CBC WITH DIFFERENTIAL/PLATELET
Basophils Absolute: 0.1 10*3/uL (ref 0.0–0.1)
Basophils Relative: 1.3 % (ref 0.0–3.0)
Eosinophils Absolute: 0.3 10*3/uL (ref 0.0–0.7)
Hemoglobin: 13.9 g/dL (ref 13.0–17.0)
Lymphocytes Relative: 32.2 % (ref 12.0–46.0)
MCHC: 33.3 g/dL (ref 30.0–36.0)
Monocytes Relative: 7.5 % (ref 3.0–12.0)
Neutro Abs: 3.6 10*3/uL (ref 1.4–7.7)
Neutrophils Relative %: 54.9 % (ref 43.0–77.0)
RBC: 4.33 Mil/uL (ref 4.22–5.81)
WBC: 6.6 10*3/uL (ref 4.5–10.5)

## 2011-07-27 LAB — BASIC METABOLIC PANEL
Calcium: 9.1 mg/dL (ref 8.4–10.5)
Creatinine, Ser: 0.7 mg/dL (ref 0.4–1.5)
GFR: 125.1 mL/min (ref >60.00)
Sodium: 139 mEq/L (ref 135–145)

## 2011-07-27 LAB — PROTIME-INR: INR: 1 ratio (ref 0.8–1.0)

## 2011-07-30 ENCOUNTER — Ambulatory Visit (HOSPITAL_COMMUNITY)
Admission: RE | Admit: 2011-07-30 | Discharge: 2011-07-31 | Disposition: A | Discharge status: Home / Self Care | Payer: No Typology Code available for payment source | Source: Ambulatory Visit | Attending: Cardiology | Admitting: Cardiology

## 2011-07-30 DIAGNOSIS — I252 Old myocardial infarction: Secondary | ICD-10-CM | POA: Insufficient documentation

## 2011-07-30 DIAGNOSIS — I25810 Atherosclerosis of coronary artery bypass graft(s) without angina pectoris: Secondary | ICD-10-CM

## 2011-07-30 DIAGNOSIS — I10 Essential (primary) hypertension: Secondary | ICD-10-CM | POA: Insufficient documentation

## 2011-07-30 DIAGNOSIS — E7800 Pure hypercholesterolemia, unspecified: Secondary | ICD-10-CM | POA: Insufficient documentation

## 2011-07-30 DIAGNOSIS — F0280 Dementia in other diseases classified elsewhere without behavioral disturbance: Secondary | ICD-10-CM | POA: Insufficient documentation

## 2011-07-30 DIAGNOSIS — M129 Arthropathy, unspecified: Secondary | ICD-10-CM | POA: Insufficient documentation

## 2011-07-30 DIAGNOSIS — K219 Gastro-esophageal reflux disease without esophagitis: Secondary | ICD-10-CM | POA: Insufficient documentation

## 2011-07-30 DIAGNOSIS — I2510 Atherosclerotic heart disease of native coronary artery without angina pectoris: Secondary | ICD-10-CM | POA: Insufficient documentation

## 2011-07-30 DIAGNOSIS — G309 Alzheimer's disease, unspecified: Secondary | ICD-10-CM | POA: Insufficient documentation

## 2011-07-30 LAB — GLUCOSE, CAPILLARY
Glucose-Capillary: 123 mg/dL — ABNORMAL HIGH (ref 70–99)
Glucose-Capillary: 95 mg/dL (ref 70–99)

## 2011-07-30 LAB — POCT ACTIVATED CLOTTING TIME: Activated Clotting Time: 369 seconds

## 2011-07-31 LAB — BASIC METABOLIC PANEL
CO2: 30 mEq/L (ref 19–32)
Calcium: 9.3 mg/dL (ref 8.4–10.5)
GFR calc Af Amer: >60 mL/min (ref >60)
GFR calc non Af Amer: >60 mL/min (ref >60)
Sodium: 138 mEq/L (ref 135–145)

## 2011-07-31 LAB — CBC
MCH: 32 pg (ref 26.0–34.0)
Platelets: 224 10*3/uL (ref 150–400)
RBC: 4.4 MIL/uL (ref 4.22–5.81)
RDW: 12.3 % (ref 11.5–15.5)

## 2011-08-02 ENCOUNTER — Telehealth: Payer: Self-pay | Admitting: Cardiology

## 2011-08-02 LAB — GLUCOSE, CAPILLARY: Glucose-Capillary: 99 mg/dL (ref 70–99)

## 2011-08-02 NOTE — Telephone Encounter (Signed)
Pt was walking Friday and had some chest pain and he wants to make sure his stents did not move or anything

## 2011-08-02 NOTE — Telephone Encounter (Signed)
[name] was doing yard work & experienced chest pain yesterday. He had a cath 07/30/11 and will be treated medically for his coronary disease. He was scared that his stent had moved. I informed him that this did not happen. He relaxed and took a hydrocodone tablet and his pain eventually subsided. He denies chest pain currently. I did advise him to try taking Nitroglycerine tablets next time but also to notify us if he continues to have chest pain.

## 2011-08-03 NOTE — Discharge Summary (Signed)
Dustin [name] NO.:  [id number]  MEDICAL RECORD NO.:  [id number]  LOCATION:  6529                         FACILITY:  [facility]  PHYSICIAN:  Cassell Clement, M.D. DATE OF BIRTH:  1934-05-15  DATE OF ADMISSION:  07/30/2011 DATE OF DISCHARGE:  07/31/2011                              DISCHARGE SUMMARY   PRIMARY CARDIOLOGIST:  Maisie Fus D. [name], MD, [facility]  PRIMARY CARE PHYSICIAN:  [name], MD  PROCEDURES PERFORMED DURING HOSPITALIZATION: 1. Cardiac catheterization.     a.     Mild reduction in left ventricular function with inferobasal      wall motion abnormalities, continued patency of internal mammary      to left anterior descending, continued patency of saphenous vein      graft to diagonal, continued patency of saphenous vein graft to      two branches of marginal with severe disease in the marginal      system retrograde.     b.     Progression of disease in the saphenous vein graft to the      posterior descending branch with successful percutaneous stenting      of high-grade stenosis of the limb between the posterior      descending artery and posterolateral system which had been      previously noted.  Note that there were some collaterals noted      also within the posterolateral system.  FINAL DISCHARGE DIAGNOSES: 1. Coronary artery disease.     a.     Status post myocardial infarction.     b.     Status post successful placement of Cypher drug-eluting      stent to the saphenous vein graft, left anterior descending to the      posterior descending artery in July 2009.     c.     Status post coronary artery bypass graft with left internal      mammary artery to left anterior descending and saphenous vein      graft to diagonal, saphenous vein graft to marginal, saphenous      vein graft to circumflex. 2. Status post cardiac catheterization dated July 30, 2011.     a.     Mild reduction in the left ventricular function with  inferobasal wall abnormality, continued patency of left internal      mammary artery to left anterior descending, continued patency of      saphenous vein graft to diagonal, continued patency of saphenous      vein graft to two branches of the marginal with severe disease in      the marginal system retrograde as noted above.     b.     Progression of disease in the saphenous vein graft to the      posterior descending artery with successful percutaneous stenting      using a 2.75 x 16 Promus drug-eluting stent.  There was high-grade      stenosis in the limb between the posterior descending artery and      the posterolateral system which had been previously noted. 3. Alzheimer's. 4. Hypercholesterolemia. 5. Hypertension. 6. Gastroesophageal reflux disease.  7. Arthritis.  HOSPITAL COURSE:  This is a 77 year old patient, well known to Dr. [name], who was seen in his office on July 26, 2011, with more episodes of chest discomfort, relieved by nitroglycerin.  The patient's wife and himself asked for another cardiac catheterization.  The pain was nonradiating and was relieved with nitroglycerin with no progressive symptoms.  After a careful thought and evaluation by Dr. [name], the patient was scheduled for cardiac catheterization as an outpatient.  The patient was found to have disease of the SVG to the posterior descending artery which required percutaneous intervention of the saphenous vein graft to the posterior descending artery using Spider protection and placement of two drug-eluting stents.  The patient tolerated the procedure well without any further evidence of chest discomfort.  He recovered well without evidence of bleeding, hematoma, or signs of infection.  As a result of this, the patient was seen and examined by Dr. Patty Sermons the following morning and found to be stable for discharge.  The patient will follow up with Dr. [name] as an outpatient and continue current  medications he was taking prior to admission without any new changes.  CURRENT LABORATORY DATA:  Sodium 138, potassium 3.6, chloride 102, CO2 of 30, glucose 98, BUN 13, creatinine 0.53.  Hemoglobin 14.0, hematocrit 41.0, white blood cells 7.4, platelets 224.  ACT 369, PT 11.5, INR 1.0.  RADIOLOGY:  No chest x-rays were completed prior to catheterization.  VITAL SIGNS ON DISCHARGE:  Blood pressure 109/59, pulse 59, respirations 18, temperature 97.5.  No weight was taken during hospitalization.  DISCHARGE MEDICATIONS: 1. Amlodipine 10 mg daily. 2. Aricept 10 mg daily. 3. Aspirin 81 mg daily. 4. Carvedilol 3.125 mg daily. 5. CoQ10 - 100 mg by mouth daily. 6. Fentanyl patch 75 mcg every other day. 7. Flaxseed oil 1 capsule by mouth daily. 8. Flonase nasal spray 2 sprays daily as needed. 9. Fish oil 100 mg daily. 10.Hydrochlorothiazide 12.5 mg daily. 11.Namenda 1 tablet by mouth daily. 12.OTC stool softener 1 tablet by mouth daily. 13.Oxycodone 15 mg by mouth q.4 h. p.r.n. 14.Pravastatin 40 mg by mouth daily. 15.Plavix 75 mg by mouth daily. 16.Xanax 1 mg 2 tablets by mouth daily at bedtime.  ALLERGIES:  No known drug allergies.  FOLLOWUP PLANS AND APPOINTMENT: 1. It is noted that the patient is on Coreg 3.125 mg daily although     this is a b.i.d. dose.  This will need to be followed up in the     office for recommendations on dosing. 2. The patient is to follow up with Dr. [name] in 1-2 weeks in the     office for continued cardiac management postprocedure. 3. The patient will follow up with Dr. [name] for continued medical     management. 4. The patient has been advised on postcardiac catheterization     instructions for evidence of bleeding, hematoma,     and infection. 5. The patient is advised to bring all medications to followup     appointments.  Time spent with the patient to include the physician time 35 minutes.     Bettey Mare. Lyman Bishop,  NP   ______________________________ Cassell Clement, M.D.    KML/MEDQ  D:  07/31/2011  T:  07/31/2011  Job:  [id number]  cc:   [name], MD  Electronically Signed by Joni Reining NP on 08/02/2011 08:40:36 AM Electronically Signed by Cassell Clement M.D. on 08/03/2011 01:44:15 PM

## 2011-08-04 ENCOUNTER — Other Ambulatory Visit: Payer: Self-pay

## 2011-08-04 ENCOUNTER — Encounter (HOSPITAL_COMMUNITY): Payer: Self-pay | Admitting: *Deleted

## 2011-08-04 ENCOUNTER — Emergency Department (HOSPITAL_COMMUNITY)
Admission: EM | Admit: 2011-08-04 | Discharge: 2011-08-05 | Disposition: A | Discharge status: Home / Self Care | Payer: No Typology Code available for payment source | Source: Home / Self Care | Attending: Emergency Medicine | Admitting: Emergency Medicine

## 2011-08-04 ENCOUNTER — Emergency Department (HOSPITAL_COMMUNITY): Payer: No Typology Code available for payment source

## 2011-08-04 DIAGNOSIS — R059 Cough, unspecified: Secondary | ICD-10-CM | POA: Insufficient documentation

## 2011-08-04 DIAGNOSIS — Z87891 Personal history of nicotine dependence: Secondary | ICD-10-CM | POA: Insufficient documentation

## 2011-08-04 DIAGNOSIS — Z79899 Other long term (current) drug therapy: Secondary | ICD-10-CM | POA: Insufficient documentation

## 2011-08-04 DIAGNOSIS — R9431 Abnormal electrocardiogram [ECG] [EKG]: Secondary | ICD-10-CM

## 2011-08-04 DIAGNOSIS — R062 Wheezing: Secondary | ICD-10-CM | POA: Insufficient documentation

## 2011-08-04 DIAGNOSIS — I2510 Atherosclerotic heart disease of native coronary artery without angina pectoris: Secondary | ICD-10-CM | POA: Insufficient documentation

## 2011-08-04 DIAGNOSIS — E785 Hyperlipidemia, unspecified: Secondary | ICD-10-CM | POA: Insufficient documentation

## 2011-08-04 DIAGNOSIS — I10 Essential (primary) hypertension: Secondary | ICD-10-CM | POA: Insufficient documentation

## 2011-08-04 DIAGNOSIS — Z951 Presence of aortocoronary bypass graft: Secondary | ICD-10-CM | POA: Insufficient documentation

## 2011-08-04 DIAGNOSIS — R6883 Chills (without fever): Secondary | ICD-10-CM | POA: Insufficient documentation

## 2011-08-04 DIAGNOSIS — R61 Generalized hyperhidrosis: Secondary | ICD-10-CM | POA: Insufficient documentation

## 2011-08-04 DIAGNOSIS — R079 Chest pain, unspecified: Secondary | ICD-10-CM | POA: Insufficient documentation

## 2011-08-04 DIAGNOSIS — F29 Unspecified psychosis not due to a substance or known physiological condition: Secondary | ICD-10-CM | POA: Insufficient documentation

## 2011-08-04 DIAGNOSIS — Z7982 Long term (current) use of aspirin: Secondary | ICD-10-CM | POA: Insufficient documentation

## 2011-08-04 DIAGNOSIS — I252 Old myocardial infarction: Secondary | ICD-10-CM | POA: Insufficient documentation

## 2011-08-04 DIAGNOSIS — R05 Cough: Secondary | ICD-10-CM | POA: Insufficient documentation

## 2011-08-04 LAB — CBC
HCT: 37.7 % — ABNORMAL LOW (ref 39.0–52.0)
Platelets: 213 10*3/uL (ref 150–400)
RDW: 12.2 % (ref 11.5–15.5)
WBC: 8 10*3/uL (ref 4.0–10.5)

## 2011-08-04 LAB — DIFFERENTIAL
Basophils Absolute: 0 10*3/uL (ref 0.0–0.1)
Lymphocytes Relative: 21 % (ref 12–46)
Neutro Abs: 5 10*3/uL (ref 1.7–7.7)
Neutrophils Relative %: 63 % (ref 43–77)

## 2011-08-04 LAB — URINALYSIS, ROUTINE W REFLEX MICROSCOPIC
Specific Gravity, Urine: 1.01 (ref 1.005–1.030)
Urobilinogen, UA: 2 mg/dL — ABNORMAL HIGH (ref 0.0–1.0)
pH: 6.5 (ref 5.0–8.0)

## 2011-08-04 LAB — CARDIAC PANEL(CRET KIN+CKTOT+MB+TROPI)
CK, MB: 3.4 ng/mL (ref 0.3–4.0)
Relative Index: 2.8 — ABNORMAL HIGH (ref 0.0–2.5)
Total CK: 120 U/L (ref 7–232)

## 2011-08-04 LAB — URINE MICROSCOPIC-ADD ON

## 2011-08-04 LAB — BASIC METABOLIC PANEL
CO2: 28 mEq/L (ref 19–32)
Chloride: 94 mEq/L — ABNORMAL LOW (ref 96–112)
GFR calc Af Amer: >60 mL/min (ref >60)
Potassium: 3.8 mEq/L (ref 3.5–5.1)
Sodium: 136 mEq/L (ref 135–145)

## 2011-08-04 MED ORDER — ALBUTEROL SULFATE (5 MG/ML) 0.5% IN NEBU
5.0000 mg | INHALATION_SOLUTION | Freq: Once | RESPIRATORY_TRACT | Status: AC
  Administered 2011-08-04: 5 mg via RESPIRATORY_TRACT
  Filled 2011-08-04 (×2): qty 1

## 2011-08-04 MED ORDER — IPRATROPIUM BROMIDE 0.02 % IN SOLN
0.5000 mg | Freq: Once | RESPIRATORY_TRACT | Status: AC
  Administered 2011-08-04: 0.5 mg via RESPIRATORY_TRACT
  Filled 2011-08-04: qty 2.5

## 2011-08-04 MED ORDER — MOXIFLOXACIN HCL IN NACL 400 MG/250ML IV SOLN
400.0000 mg | Freq: Once | INTRAVENOUS | Status: AC
  Administered 2011-08-04: 400 mg via INTRAVENOUS
  Filled 2011-08-04: qty 250

## 2011-08-04 MED ORDER — ASPIRIN 81 MG PO CHEW
324.0000 mg | CHEWABLE_TABLET | Freq: Once | ORAL | Status: AC
  Administered 2011-08-04: 324 mg via ORAL
  Filled 2011-08-04: qty 4

## 2011-08-04 MED ORDER — ALBUTEROL SULFATE (5 MG/ML) 0.5% IN NEBU
5.0000 mg | INHALATION_SOLUTION | Freq: Once | RESPIRATORY_TRACT | Status: AC
  Administered 2011-08-04: 5 mg via RESPIRATORY_TRACT
  Filled 2011-08-04: qty 1

## 2011-08-04 MED ORDER — FENTANYL CITRATE 0.05 MG/ML IJ SOLN
50.0000 ug | Freq: Once | INTRAMUSCULAR | Status: AC
  Administered 2011-08-04: 50 ug via INTRAVENOUS
  Filled 2011-08-04: qty 2

## 2011-08-04 MED ORDER — VANCOMYCIN HCL IN DEXTROSE 1-5 GM/200ML-% IV SOLN
1000.0000 mg | Freq: Once | INTRAVENOUS | Status: DC
  Filled 2011-08-04: qty 200

## 2011-08-04 NOTE — ED Notes (Addendum)
Pt reports he had stents placed on Friday.  Reports at this time he is having pain in his chest, which he thinks may be related to coughing.  Denies SOB, but reports productive cough and feeling congested. Crackles heard in left upper lobes, faint wheeze in left lower lobe.   Denies nausea.  Reports pain is in central chest and some on left side, denies radiating to back or neck. IV started, EKG completed, EDP at bedside.

## 2011-08-04 NOTE — ED Notes (Signed)
Patient recently had stent placed on Friday, Sunday morning began to have chest discomfort, Productive cough of green phelgm

## 2011-08-04 NOTE — ED Provider Notes (Signed)
Scribed for Dr Banita Lehn Smitty Cords, MD, the patient was seen in room APA07/APA07. This chart was scribed by [employer]. The patient's care started at 20:40  History     CSN: [id number] Arrival date & time: 08/04/2011  8:14 PM  Chief Complaint  Patient presents with  . Chest Pain    had stents placed on Friday   A level 5 caveat applies due to Alzheimer's. The history is provided by the patient and a relative. History Limited By: Alzheimer's disease.   Patient presents to the ED with complaint of chest pain, described as "discomfort" which radiates to left shoulder intermittently. Pain onset is 2-3 days ago.  Associated symptoms include a productive cough with green sputum onset three days ago and worsening since. He also notes diaphoresis and chills. Patient denies fever and vomiting. Patient saw Dr Quenton Fetter and had a catherization 6 days ago with 2 stents placed at the SVG to PDA. He has a history of 6 bypass surgeries    Past Medical History  Diagnosis Date  . Arthritis   . CAD (coronary artery disease)   . Hyperlipidemia   . HTN (hypertension)   . MI (myocardial infarction)   . Hematuria   . OSA (obstructive sleep apnea)   . Dementia     Past Surgical History  Procedure Date  . Coronary artery bypass graft     1996 x5:   LIMA to LAD, SVG D2, SVG to PDA, SVG to OM1,2  . Hernia repair   . Coronary angioplasty with stent placement     Family History  Problem Relation Age of Onset  . Heart disease      History  Substance Use Topics  . Smoking status: Former Smoker    Types: Cigarettes    Quit date: 12/27/1994  . Smokeless tobacco: Not on file  . Alcohol Use: No      Review of Systems  Unable to perform ROS: Other  Constitutional: Positive for chills and diaphoresis. Negative for fever.  Eyes: Negative for visual disturbance.  Respiratory: Positive for cough and wheezing.        Coughing up sputum  Gastrointestinal: Negative for nausea, vomiting and  diarrhea.  Genitourinary: Negative for dysuria and hematuria.  Neurological: Negative for seizures and headaches.  Psychiatric/Behavioral: Positive for confusion. Negative for behavioral problems and agitation.  All other systems reviewed and are negative.    Physical Exam  BP 127/63  Pulse 65  Temp(Src) 98.2 F (36.8 C) (Oral)  Resp 16  Ht 5\' 10"  (1.778 m)  Wt 200 lb (90.719 kg)  BMI 28.70 kg/m2  SpO2 96%  Physical Exam  Nursing note and vitals reviewed. HENT:  Right Ear: Tympanic membrane normal. No hemotympanum.  Left Ear: Tympanic membrane normal. No hemotympanum.  Mouth/Throat: Oropharynx is clear and moist.       No infusions   Eyes: Pupils are equal, round, and reactive to light.  Neck: Neck supple. No JVD present. No tracheal tenderness present. Carotid bruit is not present. No thyromegaly present.  Cardiovascular: Normal rate, regular rhythm and normal heart sounds.   No murmur heard. Pulmonary/Chest: No stridor. He has wheezes. He has rhonchi.       Wheezing bilaterally, left worse than right. Rhonchi bilaterally right worse than left.  Abdominal: Soft. Bowel sounds are normal. He exhibits no distension. There is no rebound and no guarding.    Musculoskeletal: Normal range of motion.  Neurological: He is alert.  Skin: Skin is warm. He  is diaphoretic.  Psychiatric: He has a normal mood and affect. His behavior is normal.    ED Course  Procedures  OTHER DATA REVIEWED: Nursing notes, vital signs, and past medical records reviewed.   DIAGNOSTIC STUDIES: Oxygen Saturation is 97% on 2 liters/min via Patient connected to nasal cannula oxygen, normal by my interpretation.       LABS / RADIOLOGY:  Results for orders placed during the hospital encounter of 08/04/11  CBC      Component Value Range   WBC 8.0  4.0 - 10.5 (K/uL)   RBC 3.97 (*) 4.22 - 5.81 (MIL/uL)   Hemoglobin 12.8 (*) 13.0 - 17.0 (g/dL)   HCT 16.1 (*) 09.6 - 52.0 (%)   MCV 95.0  78.0 -  100.0 (fL)   MCH 32.2  26.0 - 34.0 (pg)   MCHC 34.0  30.0 - 36.0 (g/dL)   RDW 04.5  40.9 - 81.1 (%)   Platelets 213  150 - 400 (K/uL)  DIFFERENTIAL      Component Value Range   Neutrophils Relative 63  43 - 77 (%)   Neutro Abs 5.0  1.7 - 7.7 (K/uL)   Lymphocytes Relative 21  12 - 46 (%)   Lymphs Abs 1.7  0.7 - 4.0 (K/uL)   Monocytes Relative 11  3 - 12 (%)   Monocytes Absolute 0.9  0.1 - 1.0 (K/uL)   Eosinophils Relative 5  0 - 5 (%)   Eosinophils Absolute 0.4  0.0 - 0.7 (K/uL)   Basophils Relative 1  0 - 1 (%)   Basophils Absolute 0.0  0.0 - 0.1 (K/uL)  BASIC METABOLIC PANEL      Component Value Range   Sodium 136  135 - 145 (mEq/L)   Potassium 3.8  3.5 - 5.1 (mEq/L)   Chloride 94 (*) 96 - 112 (mEq/L)   CO2 28  19 - 32 (mEq/L)   Glucose, Bld 140 (*) 70 - 99 (mg/dL)   BUN 13  6 - 23 (mg/dL)   Creatinine, Ser 9.14  0.50 - 1.35 (mg/dL)   Calcium 9.9  8.4 - 78.2 (mg/dL)   GFR calc non Af Amer >60  >60 (mL/min)   GFR calc Af Amer >60  >60 (mL/min)  CARDIAC PANEL(CRET KIN+CKTOT+MB+TROPI)      Component Value Range   Total CK 120  7 - 232 (U/L)   CK, MB 3.4  0.3 - 4.0 (ng/mL)   Troponin I <0.30  <0.30 (ng/mL)   Relative Index 2.8 (*) 0.0 - 2.5   URINALYSIS, ROUTINE W REFLEX MICROSCOPIC      Component Value Range   Color, Urine YELLOW  YELLOW    Appearance CLEAR  CLEAR    Specific Gravity, Urine 1.010  1.005 - 1.030    pH 6.5  5.0 - 8.0    Glucose, UA NEGATIVE  NEGATIVE (mg/dL)   Hgb urine dipstick SMALL (*) NEGATIVE    Bilirubin Urine NEGATIVE  NEGATIVE    Ketones, ur NEGATIVE  NEGATIVE (mg/dL)   Protein, ur NEGATIVE  NEGATIVE (mg/dL)   Urobilinogen, UA 2.0 (*) 0.0 - 1.0 (mg/dL)   Nitrite NEGATIVE  NEGATIVE    Leukocytes, UA NEGATIVE  NEGATIVE   CULTURE, BLOOD (ROUTINE X 2)      Component Value Range   Specimen Description BLOOD LEFT ARM     Special Requests BOTTLES DRAWN AEROBIC AND ANAEROBIC 6CC     Culture PENDING     Report Status PENDING  URINE MICROSCOPIC-ADD  ON      Component Value Range   Squamous Epithelial / LPF RARE  RARE    WBC, UA 0-2  <3 (WBC/hpf)   RBC / HPF 0-2  <3 (RBC/hpf)   Bacteria, UA RARE  RARE    Dg Chest 2 View  08/04/2011  *RADIOLOGY REPORT*  Clinical Data: Cough.  CHEST - 2 VIEW  Comparison: Chest x-ray 02/13/2011.  Findings: The heart is upper limits of normal in size and stable. The mediastinal and hilar contours are stable.  Stable surgical changes from triple bypass surgery.  There are chronic lung changes and streaky areas of bibasilar atelectasis but no infiltrates, edema or effusions.  The bony thorax is intact.  IMPRESSION: Chronic lung changes and streaky basilar atelectasis.  Original Report Authenticated By: P. Loralie Champagne, M.D.      ED COURSE / COORDINATION OF CARE: Orders Placed This Encounter  Procedures  . Urine culture  . Culture, blood (routine x 2)  . DG Chest 2 View  . CBC  . Differential  . Basic metabolic panel  . Cardiac panel(cret kin+cktot+mb+tropi)  . Urinalysis, Routine w reflex microscopic  . Urine microscopic-add on  . Consult to cardiology   22:49 EDMD consult completed to cardiologist. Patient case explained and discussed.    MDM: chest pain with recent cath   IMPRESSION: Diagnoses that have been ruled out:  Diagnoses that are still under consideration:  Final diagnoses:  Chest pain, unspecified  Abnormal EKG      PLAN: Admit   MEDICATIONS GIVEN IN THE E.D.  Medications  albuterol (PROVENTIL) (5 MG/ML) 0.5% nebulizer solution 5 mg (not administered)  ipratropium (ATROVENT) 0.02 % nebulizer solution 0.5 mg (not administered)  fentaNYL (SUBLIMAZE) injection 50 mcg (not administered)  aspirin chewable tablet 324 mg (324 mg Oral Given 08/04/11 2145)     DISCHARGE MEDICATIONS: New Prescriptions   No medications on file    Date: 08/04/2011  Rate: 59  Rhythm: sinus bradycardia  QRS Axis: normal  Intervals: normal  ST/T Wave abnormalities: ST depressions  inferiorly  Conduction Disutrbances:none  Narrative Interpretation:   Old EKG Reviewed: changes noted     I personally performed the services described in this documentation, which was scribed in my presence. The recorded information has been reviewed and considered. Ryllie Nieland Smitty Cords, MD   Season Astacio Smitty Cords, MD 08/04/11 2257

## 2011-08-05 ENCOUNTER — Inpatient Hospital Stay (HOSPITAL_COMMUNITY)
Admission: AD | Admit: 2011-08-05 | Discharge: 2011-08-06 | DRG: 203 | Disposition: A | Discharge status: Home / Self Care | Payer: No Typology Code available for payment source | Source: Other Acute Inpatient Hospital | Attending: Cardiology | Admitting: Cardiology

## 2011-08-05 ENCOUNTER — Inpatient Hospital Stay (HOSPITAL_COMMUNITY): Payer: No Typology Code available for payment source

## 2011-08-05 DIAGNOSIS — I252 Old myocardial infarction: Secondary | ICD-10-CM

## 2011-08-05 DIAGNOSIS — I2510 Atherosclerotic heart disease of native coronary artery without angina pectoris: Secondary | ICD-10-CM | POA: Diagnosis present

## 2011-08-05 DIAGNOSIS — Z7902 Long term (current) use of antithrombotics/antiplatelets: Secondary | ICD-10-CM

## 2011-08-05 DIAGNOSIS — Z79899 Other long term (current) drug therapy: Secondary | ICD-10-CM

## 2011-08-05 DIAGNOSIS — I10 Essential (primary) hypertension: Secondary | ICD-10-CM | POA: Diagnosis present

## 2011-08-05 DIAGNOSIS — Z9861 Coronary angioplasty status: Secondary | ICD-10-CM

## 2011-08-05 DIAGNOSIS — G309 Alzheimer's disease, unspecified: Secondary | ICD-10-CM | POA: Diagnosis present

## 2011-08-05 DIAGNOSIS — Z7982 Long term (current) use of aspirin: Secondary | ICD-10-CM

## 2011-08-05 DIAGNOSIS — R079 Chest pain, unspecified: Secondary | ICD-10-CM

## 2011-08-05 DIAGNOSIS — M129 Arthropathy, unspecified: Secondary | ICD-10-CM | POA: Diagnosis present

## 2011-08-05 DIAGNOSIS — J209 Acute bronchitis, unspecified: Principal | ICD-10-CM | POA: Diagnosis present

## 2011-08-05 DIAGNOSIS — R0789 Other chest pain: Secondary | ICD-10-CM | POA: Diagnosis present

## 2011-08-05 DIAGNOSIS — R05 Cough: Secondary | ICD-10-CM

## 2011-08-05 DIAGNOSIS — Z951 Presence of aortocoronary bypass graft: Secondary | ICD-10-CM

## 2011-08-05 DIAGNOSIS — Z87891 Personal history of nicotine dependence: Secondary | ICD-10-CM

## 2011-08-05 DIAGNOSIS — G4733 Obstructive sleep apnea (adult) (pediatric): Secondary | ICD-10-CM | POA: Diagnosis present

## 2011-08-05 DIAGNOSIS — E785 Hyperlipidemia, unspecified: Secondary | ICD-10-CM | POA: Diagnosis present

## 2011-08-05 DIAGNOSIS — F0280 Dementia in other diseases classified elsewhere without behavioral disturbance: Secondary | ICD-10-CM | POA: Diagnosis present

## 2011-08-05 DIAGNOSIS — Z8249 Family history of ischemic heart disease and other diseases of the circulatory system: Secondary | ICD-10-CM

## 2011-08-05 DIAGNOSIS — K219 Gastro-esophageal reflux disease without esophagitis: Secondary | ICD-10-CM | POA: Diagnosis present

## 2011-08-05 LAB — D-DIMER, QUANTITATIVE: D-Dimer, Quant: 0.34 ug/mL-FEU (ref 0.00–0.48)

## 2011-08-05 LAB — CARDIAC PANEL(CRET KIN+CKTOT+MB+TROPI)
CK, MB: 2.6 ng/mL (ref 0.3–4.0)
CK, MB: 2.4 ng/mL (ref 0.3–4.0)
Relative Index: INVALID (ref 0.0–2.5)
Troponin I: <0.3 ng/mL (ref <0.30)
Troponin I: <0.3 ng/mL (ref <0.30)
Troponin I: <0.3 ng/mL (ref <0.30)

## 2011-08-05 LAB — EXPECTORATED SPUTUM ASSESSMENT W GRAM STAIN, RFLX TO RESP C

## 2011-08-06 DIAGNOSIS — J209 Acute bronchitis, unspecified: Secondary | ICD-10-CM

## 2011-08-06 LAB — CBC
HCT: 36.9 % — ABNORMAL LOW (ref 39.0–52.0)
Hemoglobin: 12.2 g/dL — ABNORMAL LOW (ref 13.0–17.0)
MCH: 31.4 pg (ref 26.0–34.0)
MCV: 95.1 fL (ref 78.0–100.0)
Platelets: 200 10*3/uL (ref 150–400)
RBC: 3.88 MIL/uL — ABNORMAL LOW (ref 4.22–5.81)
WBC: 6 10*3/uL (ref 4.0–10.5)

## 2011-08-06 LAB — BASIC METABOLIC PANEL
BUN: 13 mg/dL (ref 6–23)
CO2: 33 mEq/L — ABNORMAL HIGH (ref 19–32)
Calcium: 9.3 mg/dL (ref 8.4–10.5)
Chloride: 100 mEq/L (ref 96–112)
Creatinine, Ser: 0.51 mg/dL (ref 0.50–1.35)
Glucose, Bld: 109 mg/dL — ABNORMAL HIGH (ref 70–99)

## 2011-08-06 LAB — URINE CULTURE
Culture  Setup Time: 201208100440
Culture: NO GROWTH

## 2011-08-09 ENCOUNTER — Ambulatory Visit (INDEPENDENT_AMBULATORY_CARE_PROVIDER_SITE_OTHER): Payer: No Typology Code available for payment source | Admitting: Cardiology

## 2011-08-09 DIAGNOSIS — I25810 Atherosclerosis of coronary artery bypass graft(s) without angina pectoris: Secondary | ICD-10-CM

## 2011-08-09 DIAGNOSIS — J40 Bronchitis, not specified as acute or chronic: Secondary | ICD-10-CM

## 2011-08-09 DIAGNOSIS — E785 Hyperlipidemia, unspecified: Secondary | ICD-10-CM

## 2011-08-09 LAB — CULTURE, BLOOD (ROUTINE X 2)
Culture: NO GROWTH
Culture: NO GROWTH

## 2011-08-09 NOTE — Patient Instructions (Signed)
Your physician recommends that you schedule a follow-up appointment in:  2 MONTHS WITH DR [name]  Your physician recommends that you continue on your current medications as directed. Please refer to the Current Medication list given to you today.

## 2011-08-12 NOTE — Cardiovascular Report (Signed)
NAMESADIE, PICKAR NO.:  [id number]  MEDICAL RECORD NO.:  [id number]  LOCATION:  6529                         FACILITY:  [facility]  PHYSICIAN:  [name]. [name], MD, FACCDATE OF BIRTH:  12/04/1934  DATE OF PROCEDURE:  07/30/2011 DATE OF DISCHARGE:                           CARDIAC CATHETERIZATION   INDICATIONS:  Mr. Shelton is well known to me.  He has had prior revascularization surgery, and saphenous vein graft intervention.  He had a modest lesion in the right coronary graft in 2009, this had more recent symptoms.  He was brought back today for further evaluation.  We have been discussing this for sometime.  PROCEDURES: 1. Left heart catheterization. 2. Selective coronary arteriography. 3. Selective left ventriculography. 4. Saphenous vein graft angiography. 5. Selective left internal mammary angiography. 6. Percutaneous intervention of the saphenous vein graft to the     posterior descending artery using spider protection and placement     of two drug-eluting stents.  DESCRIPTION OF PROCEDURE:  The patient was brought to the Cath Lab, prepped and draped in the usual fashion.  Through an anterior puncture, the femoral artery was entered.  A 5-French sheath was placed. Diagnostic views of the coronaries, vein grafts, and internal mammary were obtained.  Central aortic and left ventricular pressures were measured with pigtail.  Ventriculography was performed in the RAO projection.  The RCA graft clearly had some progression from the previous study.  There was distal progression with some haziness beyond the distally placed stent, and there was clear-cut haziness and progression in the proximal body of the stent.  As a result, plans were made for percutaneous intervention.  The patient was agreeable to proceed.  The patient is already on Plavix.  Bivalirudin was given according to protocol and ACT was checked and found to be appropriate. The 5-French  sheath was upgraded to a 6-French sheath.  A right bypass catheter with side holes was utilized.  We were able to cross with a traverse wire distally into the PDA.  A spider was gently manipulated beyond the distal stent into the distal artery, and a 3-mm spider appeared to provide good distal protection.  Direct stenting was achieved using a 2.75 x 16 Promus Element drug-eluting stent.  Because the stent appeared to be slightly undersized even with maximum dilatation, we upgraded to a 3.25 mm postdilatation noncompliant balloon, and this was deployed within the stent throughout the stent architecture.  This balloon was then removed, and the spider catheter retrieved.  We then put a BMW wire down in the PDA, and a 20-mm x 2.5-mm stent was placed distally carefully.  The stent was deployed at approximately 17 atmospheres.  There was marked improvement in the appearance of the artery.  There was no evidence of mal-expansion, and given the fact that it is a vein graft we elected not to post-dilate this particular vessel.  The patient had minimal chest pain during the procedure, and no reflow phenomenon throughout the case.  He tolerated the procedure extremely well.  There were no major complications.  HEMODYNAMIC FINDINGS: 1. Central aortic pressure 140/61, mean 89. 2. LV pressure 133/16. 3. There was no gradient or pullback  across the aortic valve.  ANGIOGRAPHIC DATA.: 1. The left main coronary artery is somewhat calcified, but free of     critical disease. 2. The LAD is severely diseased at the takeoff of the second diagonal.     There is evidence of competitive filling distally. 3. There is flow into the second diagonal from a vein graft.  The vein     graft is widely patent without significant narrowing. 4. The internal mammary to distal LAD is widely patent. 5. There is a first marginal branch that probably has about 50% area     of narrowing.  Flow into the distal vessel is  excellent.  The AV     circumflex is then subtotally occluded and then totally occluded     after this. 6. The saphenous vein graft to two branches of the marginal are     intact.  Following the second touchdown, the vessel beyond that is     occluded and fills by retrograde collaterals.  In addition, some     collateralization of the distal AV circumflex is seen with     incomplete coverage.  There is also collateralization noted from     the saphenous vein graft injection to the right. 7. The saphenous vein graft to the distal right coronary artery has     progressed disease.  There is a 70-80% hazy lesion in the mid body     of the graft followed by a widely patent stent.  Distal to the     stent prior to the PDA is an area of 70% narrowing with a haziness     distally.  Following stenting, both of the areas looked     substantially better without significant high-grade residual     narrowing. 8. The ventriculogram demonstrates hypo to akinesis of the inferobasal     segment.  Ejection fraction in the range of 50-55%.  CONCLUSION: 1. Mild reduction of left ventricular function with an inferobasal     wall motion abnormality. 2. Continued patency internal mammary to the LAD. 3. Continued patency of saphenous vein graft to the diagonal. 4. Continued patency of the saphenous vein graft to two branches of     the marginal with severe disease of the marginal system retrograde     as noted in the above text. 5. Progression of disease in the saphenous vein graft to the posterior     descending branch with successful percutaneous stenting. 6. High-grade stenosis in the limb between the PDA and PLA system,     which has been previously noted.  Note that there are some     collaterals noted also into the PLA system.  DISPOSITION:  At the present in time, the patient will be treated medically.  He will remain on dual antiplatelet therapy.  Our hope is that this will improve his  symptoms.     [name]. [name], MD, [facility]     TDS/MEDQ  D:  07/30/2011  T:  07/31/2011  Job:  [id number]  cc:   CV Laboratory  Electronically Signed by Shawnie Pons MD [facility] on 08/12/2011 09:51:29 AM

## 2011-08-12 NOTE — Discharge Summary (Signed)
Dustin [name], GAZA NO.:  [id number]  MEDICAL RECORD NO.:  [id number]  LOCATION:  2014                         FACILITY:  [facility]  PHYSICIAN:  [name]. [name], MD, FACCDATE OF BIRTH:  03-01-1934  DATE OF ADMISSION:  08/05/2011 DATE OF DISCHARGE:  08/06/2011                              DISCHARGE SUMMARY   PRIMARY CARDIOLOGIST:  Maisie Fus D. [name], MD, [facility]  PRIMARY CARE PROVIDER:  [name], MD  DISCHARGE DIAGNOSIS:  Acute bronchitis.  SECONDARY DIAGNOSES: 1. Coronary artery disease status post prior coronary artery bypass     grafting with recent drug-eluting stent placement within the vein     graft to the posterior descending artery July 30, 2011. 2. Hyperlipidemia. 3. Hypertension. 4. Gastroesophageal reflux disease. 5. Arthritis. 6. Alzheimer dementia.  ALLERGIES:  No known drug allergies.  PROCEDURES:  None.  HISTORY OF PRESENT ILLNESS:  A 77 year old male with prior history of coronary artery disease as outlined above who was recently discharged from [facility] on July 31, 2011, following catheterization as well as drug-eluting stent placement within the vein graft to the PDA. Following discharge, the patient initially did well, however, then began to experience productive cough and pleuritic chest pain, especially worse with coughing.  The patient presented to [facility] on August 8 and given recent history of intervention with new complaints of chest pain.  Decision was made to transfer to [facility] for evaluation.  HOSPITAL COURSE:  The patient ruled out for MI.  Felt that his pleuritic chest pain along with chest congestion was most likely secondary to acute bronchitis.  The patient was placed on intravenous Zithromax therapy and has remained afebrile with a normal white blood cell count. His chest pain has resolved and his cough is improving.  His chest x-ray has shown no infiltrate and blood and sputum cultures  have shown no growth.  Our plan is to discharge him home today in good condition.  DISCHARGE LABS:  Hemoglobin 12.2, hematocrit 36.9, WBC 6.0, platelets 200.  Sodium 139, potassium 4.0, chloride 100, CO2 33, BUN 13, creatinine 0.51, glucose 109, calcium 9.3, CK 55, MB 2.4, troponin-I less than 0.30.  Urinalysis was negative.  Blood culture showed no growth.  Sputum culture was not felt to represent lower respiratory secretions.  DISPOSITION:  The patient will be discharged home today in good condition.  FOLLOWUP PLAN AND APPOINTMENTS:  The patient will follow up with Dr. [name] and Dr. [name] as previously scheduled.  DISCHARGE MEDICATIONS: 1. Amlodipine 10 mg daily. 2. Zithromax 250 mg daily x3 additional days. 3. Nitroglycerin p.r.n. 4. Aricept 10 mg daily. 5. Aspirin 81 mg daily. 6. Carvedilol 3.125 mg daily. 7. Fentanyl patch 75 mcg every other day. 8. HCTZ 12.5 mg daily. 9. Hydrocodone/APAP 10/325 mg q.6 h. p.r.n. 10.Oxycodone 15 mg q.4 h. p.r.n. 11.Pravastatin 40 mg daily. 12.Plavix 75 mg daily.  Thank you for following this patient.     Dustin [name], ANP   ______________________________ [name]. [name], MD, [facility]    CB/MEDQ  D:  08/06/2011  T:  08/06/2011  Job:  [id number]  cc:   [name], MD  Electronically Signed by Cristal Deer  BERGE ANP on 08/09/2011 03:11:38 PM Electronically Signed by Shawnie Pons MD [facility] on 08/12/2011 09:51:49 AM

## 2011-08-12 NOTE — H&P (Signed)
NAMECON, ARGANBRIGHT NO.:  [id number]  MEDICAL RECORD NO.:  [id number]  LOCATION:  2014                         FACILITY:  [facility]  PHYSICIAN:  [name]. [name], MD, FACCDATE OF BIRTH:  Oct 07, 1934  DATE OF ADMISSION:  08/05/2011 DATE OF DISCHARGE:                             HISTORY & PHYSICAL   CHIEF COMPLAINT:  Chest pain and cough.  HISTORY OF PRESENT ILLNESS:  The patient is a 77 year old white male with past medical history significant for coronary artery disease status post CABG with PCI of the vein graft to the PDA on July 30, 2011, now presenting with cough and chest discomfort.  The patient states upon discharge from the hospital, he was doing well.  Several days ago, he began to have a productive cough and has developed some chest pain that seems to be associated with the cough.  He has been compliant with his medications, denies any shortness of breath.  The patient was assessed at [facility] Emergency Room and there was some concern that there was inferior ST-segment and T-wave changes, so the patient was transferred to [facility].  PAST MEDICAL HISTORY:  Coronary artery disease, last left heart catheterization on July 30, 2011, showed a mild reduction of LV function, patency of the LIMA to the LAD, patency of the vein graft to diagonal, patency of vein graft to branches of the marginal and severe disease of the marginal system retrograde.  There was progression of disease in the vein graft to the PDA.  This was treated with a drug- eluting stent.  There was also a high-grade stenosis in a limb between the PDA and the PLA system which was also treated with a drug-eluting stent.  The patient also has hypertension, hyperlipidemia, neuropathy, arthritis, and Alzheimer's.  SOCIAL HISTORY:  History of tobacco, no longer smokes, does not drink alcohol.  FAMILY HISTORY:  Positive for heart disease.  ALLERGIES:  No known drug  allergies.  MEDICATIONS:  Upon discharge include: 1. Aspirin 81 mg daily. 2. Plavix 75 mg daily. 3. Amlodipine 10 mg daily. 4. Aricept 10 mg daily. 5. Coreg 3.125 mg b.i.d. 6. Coenzyme Q10. 7. Fentanyl patch 75 mcg every other day. 8. Hydrochlorothiazide 12.5 mg daily. 9. Namenda 1 tablet daily. 10.Oxycodone 15 mg p.o. q.4 hours p.r.n. 11.Pravastatin 40 mg daily. 12.Xanax 1 mg p.o. at bedtime p.r.n.  REVIEW OF SYSTEMS:  As in HPI.  All systems were reviewed and were negative.  PHYSICAL EXAMINATION:  VITAL SIGNS:  Afebrile with temperature 98.2, blood pressure 127/63, respirations 16. GENERAL:  In no acute distress. HEENT:  Normocephalic, atraumatic. NECK:  Supple. HEART:  Regular rate and rhythm without murmur. LUNGS:  Mild expiratory wheezing. ABDOMEN:  Soft, nontender. EXTREMITIES:  Without edema. SKIN:  Warm and dry. PSYCHIATRIC:  The patient is appropriate. NEUROLOGIC:  Grossly nonfocal. MUSCULOSKELETAL:  5/5 bilateral upper and lower extremity strength.  LABS:  Sodium 136, potassium 3.8, chloride 94, CO2 28, BUN 13, creatinine 0.8, glucose 140.  White count 8, hemoglobin 13, hematocrit 38, platelet count 213.  CK 120, CK-MB 3.4, troponin less than 0.30. EKG taken at [facility], normal sinus rhythm with nonspecific ST-segment abnormalities.  There  does not appear to be any significant changes consistent with ischemia.  EKG taken upon arrival here also demonstrates sinus bradycardia with a rate of 53 beats per minute.  There is isolated to 1-mm ST-segment elevation in aVL and approximately 2 mm of ST-segment depression in aVF, lead III is nonspecific ST-segment abnormalities.  ASSESSMENT:  A 77 year old gentleman status post percutaneous coronary intervention approximately 1 week ago, now presenting with productive cough and chest pain.  I think it is likely that the patient's symptoms are secondary to bronchitis.  He does have some EKG abnormalities upon arrival  here, but his cardiac markers are completely within normal limits.  PLAN:  We will repeat an EKG here.  His home medications will be continued.  I will place him on Zithromax for bronchitis.     Henderson Cloud, MD   ______________________________ [name]. [name], MD, [facility]    SGA/MEDQ  D:  08/05/2011  T:  08/05/2011  Job:  [id number]  Electronically Signed by Shawnie Pons MD [facility] on 08/12/2011 09:51:46 AM

## 2011-08-16 DIAGNOSIS — J40 Bronchitis, not specified as acute or chronic: Secondary | ICD-10-CM | POA: Insufficient documentation

## 2011-08-16 NOTE — Assessment & Plan Note (Signed)
Had several day course of Azithromycin and is improved.  TS

## 2011-08-16 NOTE — Assessment & Plan Note (Signed)
Last LDL was at target.   

## 2011-08-16 NOTE — Assessment & Plan Note (Signed)
Recent PCI now on DAPT.  No present chest pain and doing better.

## 2011-08-16 NOTE — Progress Notes (Signed)
HPI:  He is in for a follow up visit.  He was recently admitted to the hospital, and then readmitted.  He underwent cath, then stenting of his SVG to the RCA.  He did well, went home, then developed some chest pain, with cough, which was different.  He was admitted, placed on antibiotics, and he is better.  He has improved now since again discharge from his second admission.  His pain on readmission appeared to be from coughing, and not really from a typical cardiac source.    Current Outpatient Prescriptions  Medication Sig Dispense Refill  . amLODipine (NORVASC) 10 MG tablet Take 10 mg by mouth daily.        [name] aspirin 81 MG tablet Take 81 mg by mouth daily.        . carvedilol (COREG) 3.125 MG tablet Take 3.125 mg by mouth daily.       . clopidogrel (PLAVIX) 75 MG tablet Take 75 mg by mouth daily.        [name] docusate sodium (COLACE) 100 MG capsule Take 100 mg by mouth daily.        [name] donepezil (ARICEPT) 10 MG tablet Take 10 mg by mouth daily.       . fentaNYL (DURAGESIC - DOSED MCG/HR) 75 MCG/HR Change patch every other day      . hydrochlorothiazide (,MICROZIDE/HYDRODIURIL,) 12.5 MG capsule Take 12.5 mg by mouth daily.       [name] HYDROcodone-acetaminophen (NORCO) 10-325 MG per tablet Take 1 tablet by mouth every 6 (six) hours as needed. For pain      . NAMENDA 5 MG tablet Take 5 mg by mouth daily.       . nitroGLYCERIN (NITROSTAT) 0.4 MG SL tablet Place 0.4 mg under the tongue every 5 (five) minutes as needed.        [name] oxyCODONE (ROXICODONE) 15 MG immediate release tablet       . pravastatin (PRAVACHOL) 40 MG tablet Take 40 mg by mouth daily.        . traMADol (ULTRAM) 50 MG tablet Take 50 mg by mouth. As needed      . ZOSTAVAX 01027 UNT/0.65ML injection Inject 0.65 mLs into the skin once. For prevention of Shingles        No Known Allergies  Past Medical History  Diagnosis Date  . Arthritis   . CAD (coronary artery disease)   . Hyperlipidemia   . HTN (hypertension)   . MI (myocardial  infarction)   . Hematuria   . OSA (obstructive sleep apnea)   . Dementia     Past Surgical History  Procedure Date  . Coronary artery bypass graft     1996 x5:   LIMA to LAD, SVG D2, SVG to PDA, SVG to OM1,2  . Hernia repair   . Coronary angioplasty with stent placement     Family History  Problem Relation Age of Onset  . Heart disease      History   Social History  . Marital Status: Married    Spouse Name: N/A    Number of Children: N/A  . Years of Education: N/A   Occupational History  . Not on file.   Social History Main Topics  . Smoking status: Former Smoker    Types: Cigarettes    Quit date: 12/27/1994  . Smokeless tobacco: Not on file  . Alcohol Use: No  . Drug Use: No  . Sexually Active: Not on file   Other Topics  Concern  . Not on file   Social History Narrative  . No narrative on file    ROS: Please see the HPI.  All other systems reviewed and negative.  PHYSICAL EXAM:  BP 112/70  Pulse 68  Ht 5\' 10"  (1.778 m)  Wt 196 lb 12.8 oz (89.268 kg)  BMI 28.24 kg/m2  General: Well developed, well nourished, in no acute distress. Head:  Normocephalic and atraumatic. Neck: no JVD Lungs: Clear to auscultation and percussion.  Decrease BS and slightly prolonged expiration.  Heart: Normal S1 and S2.  No murmur, rubs or gallops.  Abdomen:  Normal bowel sounds; soft; non tender; no organomegaly Pulses: Pulses normal in all 4 extremities. Extremities: No clubbing or cyanosis. No edema. Neurologic: Alert and oriented x 3.  EKG:  CXR:   August 04, 2011  Comparison: Chest x-ray 02/13/2011.    Findings: The heart is upper limits of normal in size and stable.   The mediastinal and hilar contours are stable.  Stable surgical   changes from triple bypass surgery.  There are chronic lung changes   and streaky areas of bibasilar atelectasis but no infiltrates,   edema or effusions.  The bony thorax is intact.    IMPRESSION:   Chronic lung changes and  streaky basilar atelectasis.    Original Report Authenticated By: P. Loralie Champagne, M.D.   ASSESSMENT AND PLAN:

## 2011-09-18 ENCOUNTER — Other Ambulatory Visit: Payer: Self-pay | Admitting: Cardiology

## 2011-09-24 LAB — CBC
HCT: 39.9
HCT: 42.6
Hemoglobin: 13.7
Hemoglobin: 14
MCHC: 33.5
MCHC: 33.9
MCHC: 34.1
MCV: 94.7
MCV: 94.8
MCV: 95.9
Platelets: 201
RBC: 4.21 — ABNORMAL LOW
RBC: 4.26
RBC: 4.36
RBC: 4.5
RDW: 12.6
WBC: 6.5
WBC: 6.7
WBC: 9.8

## 2011-09-24 LAB — POCT I-STAT, CHEM 8
BUN: 14
Creatinine, Ser: 0.8
Glucose, Bld: 108 — ABNORMAL HIGH
Hemoglobin: 14.3
Sodium: 137
TCO2: 26

## 2011-09-24 LAB — TROPONIN I: Troponin I: <0.01

## 2011-09-24 LAB — URINE CULTURE
Colony Count: NO GROWTH
Special Requests: NEGATIVE

## 2011-09-24 LAB — DIFFERENTIAL
Eosinophils Absolute: 0.2
Lymphs Abs: 1.3
Monocytes Absolute: 0.8
Monocytes Relative: 8
Neutro Abs: 7.5
Neutrophils Relative %: 76

## 2011-09-24 LAB — CULTURE, BLOOD (ROUTINE X 2): Culture: NO GROWTH

## 2011-09-24 LAB — BASIC METABOLIC PANEL
BUN: 8
CO2: 27
CO2: 29
Calcium: 8.8
Chloride: 103
Creatinine, Ser: 0.71
Creatinine, Ser: 0.73
GFR calc Af Amer: >60
GFR calc Af Amer: >60
GFR calc non Af Amer: >60
Potassium: 3.6
Potassium: 3.9
Sodium: 136
Sodium: 142

## 2011-09-24 LAB — COMPREHENSIVE METABOLIC PANEL
Albumin: 3.9
BUN: 12
Calcium: 8.8
Glucose, Bld: 98
Total Protein: 6.2

## 2011-09-24 LAB — URINALYSIS, ROUTINE W REFLEX MICROSCOPIC
Ketones, ur: 15 — AB
Nitrite: NEGATIVE
Protein, ur: 30 — AB
Urobilinogen, UA: 1

## 2011-09-24 LAB — PROTIME-INR
INR: 1
Prothrombin Time: 13.1

## 2011-09-24 LAB — LIPID PANEL
LDL Cholesterol: 77
Total CHOL/HDL Ratio: 5.1
VLDL: 29

## 2011-09-24 LAB — CARDIAC PANEL(CRET KIN+CKTOT+MB+TROPI)
CK, MB: 1.2
CK, MB: 1.8
Total CK: 133
Troponin I: 0.01

## 2011-09-24 LAB — MAGNESIUM: Magnesium: 2

## 2011-09-24 LAB — POCT CARDIAC MARKERS
CKMB, poc: 2.6
Myoglobin, poc: 67.6
Myoglobin, poc: 78.6

## 2011-09-24 LAB — APTT: aPTT: 32

## 2011-09-24 LAB — CK TOTAL AND CKMB (NOT AT ARMC)
CK, MB: 3.9
Relative Index: 2.1
Total CK: 188

## 2011-10-08 ENCOUNTER — Encounter: Payer: Self-pay | Admitting: Cardiology

## 2011-10-08 ENCOUNTER — Ambulatory Visit (INDEPENDENT_AMBULATORY_CARE_PROVIDER_SITE_OTHER): Payer: No Typology Code available for payment source | Admitting: Cardiology

## 2011-10-08 DIAGNOSIS — I25810 Atherosclerosis of coronary artery bypass graft(s) without angina pectoris: Secondary | ICD-10-CM

## 2011-10-08 DIAGNOSIS — E785 Hyperlipidemia, unspecified: Secondary | ICD-10-CM

## 2011-10-08 DIAGNOSIS — I10 Essential (primary) hypertension: Secondary | ICD-10-CM

## 2011-10-08 NOTE — Patient Instructions (Signed)
Your physician recommends that you schedule a follow-up appointment in: 3 MONTHS  Your physician recommends that you continue on your current medications as directed. Please refer to the Current Medication list given to you today.   

## 2011-10-08 NOTE — Assessment & Plan Note (Signed)
LDL 66 six months ago.

## 2011-10-08 NOTE — Assessment & Plan Note (Signed)
Well controlled 

## 2011-10-08 NOTE — Assessment & Plan Note (Signed)
Recent repeat PCI of SVG to RCA.  Much improved.  Class I-II symptoms at present.  Continue current meds.

## 2011-10-08 NOTE — Progress Notes (Signed)
HPI:  Patient is stable.  He says that he was helped a lot by the stent in his SVG.  He had two new stents placed in the RCA graft. The existing stent was open.  Denies any current symptoms.    Current Outpatient Prescriptions  Medication Sig Dispense Refill  . amLODipine (NORVASC) 10 MG tablet Take 10 mg by mouth daily.        [name] aspirin 81 MG tablet Take 81 mg by mouth daily.        . carvedilol (COREG) 3.125 MG tablet Take 3.125 mg by mouth daily.       . clopidogrel (PLAVIX) 75 MG tablet Take 75 mg by mouth daily.        [name] docusate sodium (COLACE) 100 MG capsule Take 100 mg by mouth daily.        [name] donepezil (ARICEPT) 10 MG tablet Take 10 mg by mouth daily.       . fentaNYL (DURAGESIC - DOSED MCG/HR) 75 MCG/HR Change patch every other day      . hydrochlorothiazide (,MICROZIDE/HYDRODIURIL,) 12.5 MG capsule Take 12.5 mg by mouth daily.       [name] HYDROcodone-acetaminophen (NORCO) 10-325 MG per tablet Take 1 tablet by mouth every 6 (six) hours as needed. For pain      . NAMENDA 5 MG tablet Take 5 mg by mouth daily.       . nitroGLYCERIN (NITROSTAT) 0.4 MG SL tablet Place 0.4 mg under the tongue every 5 (five) minutes as needed.        [name] oxyCODONE (ROXICODONE) 15 MG immediate release tablet       . PRAVACHOL 40 MG tablet TAKE (1) TABLET BY MOUTH AT BEDTIME.  30 each  6  . traMADol (ULTRAM) 50 MG tablet Take 50 mg by mouth. As needed        No Known Allergies  Past Medical History  Diagnosis Date  . Arthritis   . CAD (coronary artery disease)   . Hyperlipidemia   . HTN (hypertension)   . MI (myocardial infarction)   . Hematuria   . OSA (obstructive sleep apnea)   . Dementia     Past Surgical History  Procedure Date  . Coronary artery bypass graft     1996 x5:   LIMA to LAD, SVG D2, SVG to PDA, SVG to OM1,2  . Hernia repair   . Coronary angioplasty with stent placement     Family History  Problem Relation Age of Onset  . Heart disease      History   Social History  .  Marital Status: Married    Spouse Name: N/A    Number of Children: N/A  . Years of Education: N/A   Occupational History  . Not on file.   Social History Main Topics  . Smoking status: Former Smoker    Types: Cigarettes    Quit date: 12/27/1994  . Smokeless tobacco: Not on file  . Alcohol Use: No  . Drug Use: No  . Sexually Active: Not on file   Other Topics Concern  . Not on file   Social History Narrative  . No narrative on file    ROS: Please see the HPI.  All other systems reviewed and negative.  PHYSICAL EXAM:  BP 120/72  Pulse 50  Resp 18  Ht 5\' 10"  (1.778 m)  Wt 195 lb (88.451 kg)  BMI 27.98 kg/m2  General: Well developed, well nourished, in no acute distress. Head:  Normocephalic and atraumatic. Neck: no JVD Lungs: Clear to auscultation and percussion. Heart: Normal S1 and S2.  No murmur, rubs or gallops.  Abdomen:  Normal bowel sounds; soft; non tender; no organomegaly Pulses: Pulses normal in all 4 extremities. Extremities: No clubbing or cyanosis. No edema. Neurologic: Alert and oriented x 3.  EKG:  SB.  Nonspecific IVCD.  Nonspecific ST and T abnormality.    ASSESSMENT AND PLAN:

## 2011-10-11 ENCOUNTER — Other Ambulatory Visit: Payer: Self-pay | Admitting: *Deleted

## 2011-10-11 MED ORDER — AMLODIPINE BESYLATE 10 MG PO TABS: 10.0000 mg | ORAL_TABLET | Freq: Every day | ORAL | Status: DC

## 2011-10-25 ENCOUNTER — Other Ambulatory Visit: Payer: Self-pay | Admitting: *Deleted

## 2011-12-17 ENCOUNTER — Encounter: Payer: Self-pay | Admitting: Cardiology

## 2011-12-17 ENCOUNTER — Ambulatory Visit (INDEPENDENT_AMBULATORY_CARE_PROVIDER_SITE_OTHER): Payer: No Typology Code available for payment source | Admitting: Cardiology

## 2011-12-17 DIAGNOSIS — I25810 Atherosclerosis of coronary artery bypass graft(s) without angina pectoris: Secondary | ICD-10-CM

## 2011-12-17 DIAGNOSIS — E785 Hyperlipidemia, unspecified: Secondary | ICD-10-CM

## 2011-12-17 DIAGNOSIS — G4733 Obstructive sleep apnea (adult) (pediatric): Secondary | ICD-10-CM

## 2011-12-17 NOTE — Assessment & Plan Note (Signed)
Has not been checked in a while.  As such, needs fasting lipid and liver which he will get at the office of Dr. [name].

## 2011-12-17 NOTE — Assessment & Plan Note (Signed)
Does not use CPAP.  Has one---but broken.

## 2011-12-17 NOTE — Progress Notes (Signed)
HPI:  He is stable.  He is in for followup. Since I last saw him he has had an occasional episode of chest discomfort. However it has not been frequent. He doesn't think there is a substantial change. The patient has had a fair amount of arthritis symptoms, but he previously been on a statin holiday, and this did not make much difference. In addition his cholesterol went up substantially. He has not had a basic profile checked although he is on a diuretic.    Current Outpatient Prescriptions  Medication Sig Dispense Refill  . amLODipine (NORVASC) 10 MG tablet Take 1 tablet (10 mg total) by mouth daily.  30 tablet  6  . aspirin 81 MG tablet Take 81 mg by mouth daily.        . carvedilol (COREG) 3.125 MG tablet Take 3.125 mg by mouth daily.       . clopidogrel (PLAVIX) 75 MG tablet Take 75 mg by mouth daily.        [name] docusate sodium (COLACE) 100 MG capsule Take 100 mg by mouth daily.        [name] donepezil (ARICEPT) 10 MG tablet Take 10 mg by mouth daily.       . fentaNYL (DURAGESIC - DOSED MCG/HR) 75 MCG/HR Change patch every other day      . hydrochlorothiazide (,MICROZIDE/HYDRODIURIL,) 12.5 MG capsule Take 12.5 mg by mouth daily.       [name] HYDROcodone-acetaminophen (NORCO) 10-325 MG per tablet Take 1 tablet by mouth every 6 (six) hours as needed. For pain      . NAMENDA 5 MG tablet Take 5 mg by mouth daily.       . nitroGLYCERIN (NITROSTAT) 0.4 MG SL tablet Place 0.4 mg under the tongue every 5 (five) minutes as needed.        [name] oxyCODONE (ROXICODONE) 15 MG immediate release tablet       . PRAVACHOL 40 MG tablet TAKE (1) TABLET BY MOUTH AT BEDTIME.  30 each  6  . traMADol (ULTRAM) 50 MG tablet Take 50 mg by mouth. As needed        No Known Allergies  Past Medical History  Diagnosis Date  . Arthritis   . CAD (coronary artery disease)   . Hyperlipidemia   . HTN (hypertension)   . MI (myocardial infarction)   . Hematuria   . OSA (obstructive sleep apnea)   . Dementia     Past Surgical  History  Procedure Date  . Coronary artery bypass graft     1996 x5:   LIMA to LAD, SVG D2, SVG to PDA, SVG to OM1,2  . Hernia repair   . Coronary angioplasty with stent placement     Family History  Problem Relation Age of Onset  . Heart disease      History   Social History  . Marital Status: Married    Spouse Name: N/A    Number of Children: N/A  . Years of Education: N/A   Occupational History  . Not on file.   Social History Main Topics  . Smoking status: Former Smoker    Types: Cigarettes    Quit date: 12/27/1994  . Smokeless tobacco: Not on file  . Alcohol Use: No  . Drug Use: No  . Sexually Active: Not on file   Other Topics Concern  . Not on file   Social History Narrative  . No narrative on file    ROS: Please see the  HPI.  All other systems reviewed and negative.  PHYSICAL EXAM:  BP 130/76  Pulse 56  Ht 5\' 10"  (1.778 m)  Wt 88.361 kg (194 lb 12.8 oz)  BMI 27.95 kg/m2  General: Well developed, well nourished, in no acute distress. Head:  Normocephalic and atraumatic. Neck: no JVD Lungs: Clear to auscultation and percussion. Heart: Normal S1 and S2.  Minimal SEM.  No sig diastolic murmur.  Abdomen:  Normal bowel sounds; soft; non tender; no organomegaly Pulses: Pulses normal in all 4 extremities. Extremities: No clubbing or cyanosis. No edema. Neurologic: Alert and oriented x 3.  EKG:  ASSESSMENT AND PLAN:

## 2011-12-17 NOTE — Patient Instructions (Addendum)
Your physician wants you to follow-up in: 4 MONTHS. You will receive a reminder letter in the mail two months in advance. If you don't receive a letter, please call our office to schedule the follow-up appointment.  Your physician recommends that you have a FASTING lipid profile, liver and BMP done with your primary care physician within the next few weeks---nothing to eat or drink after midnight  Your physician recommends that you continue on your current medications as directed. Please refer to the Current Medication list given to you today.

## 2011-12-17 NOTE — Assessment & Plan Note (Signed)
Patient is stable.  He has rare to occasional chest pain.  Denies increase in symptoms.

## 2011-12-23 ENCOUNTER — Encounter (HOSPITAL_COMMUNITY): Payer: Self-pay | Admitting: *Deleted

## 2011-12-23 ENCOUNTER — Emergency Department (HOSPITAL_COMMUNITY): Payer: No Typology Code available for payment source

## 2011-12-23 ENCOUNTER — Emergency Department (HOSPITAL_COMMUNITY)
Admission: EM | Admit: 2011-12-23 | Discharge: 2011-12-23 | Disposition: A | Discharge status: Home / Self Care | Payer: No Typology Code available for payment source | Attending: Emergency Medicine | Admitting: Emergency Medicine

## 2011-12-23 DIAGNOSIS — F0390 Unspecified dementia without behavioral disturbance: Secondary | ICD-10-CM | POA: Insufficient documentation

## 2011-12-23 DIAGNOSIS — Z79899 Other long term (current) drug therapy: Secondary | ICD-10-CM | POA: Insufficient documentation

## 2011-12-23 DIAGNOSIS — S161XXA Strain of muscle, fascia and tendon at neck level, initial encounter: Secondary | ICD-10-CM

## 2011-12-23 DIAGNOSIS — E785 Hyperlipidemia, unspecified: Secondary | ICD-10-CM | POA: Insufficient documentation

## 2011-12-23 DIAGNOSIS — Z951 Presence of aortocoronary bypass graft: Secondary | ICD-10-CM | POA: Insufficient documentation

## 2011-12-23 DIAGNOSIS — M129 Arthropathy, unspecified: Secondary | ICD-10-CM | POA: Insufficient documentation

## 2011-12-23 DIAGNOSIS — I10 Essential (primary) hypertension: Secondary | ICD-10-CM | POA: Insufficient documentation

## 2011-12-23 DIAGNOSIS — S139XXA Sprain of joints and ligaments of unspecified parts of neck, initial encounter: Secondary | ICD-10-CM | POA: Insufficient documentation

## 2011-12-23 DIAGNOSIS — M542 Cervicalgia: Secondary | ICD-10-CM | POA: Insufficient documentation

## 2011-12-23 DIAGNOSIS — M549 Dorsalgia, unspecified: Secondary | ICD-10-CM | POA: Insufficient documentation

## 2011-12-23 DIAGNOSIS — R51 Headache: Secondary | ICD-10-CM | POA: Insufficient documentation

## 2011-12-23 DIAGNOSIS — I2510 Atherosclerotic heart disease of native coronary artery without angina pectoris: Secondary | ICD-10-CM | POA: Insufficient documentation

## 2011-12-23 MED ORDER — CYCLOBENZAPRINE HCL 10 MG PO TABS: ORAL_TABLET | ORAL | Status: DC
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
## 2011-12-23 NOTE — ED Notes (Signed)
Also also has a headache

## 2012-01-12 ENCOUNTER — Other Ambulatory Visit (HOSPITAL_COMMUNITY): Payer: Self-pay | Admitting: Internal Medicine

## 2012-01-12 DIAGNOSIS — R51 Headache: Secondary | ICD-10-CM

## 2012-01-12 DIAGNOSIS — S060X9A Concussion with loss of consciousness of unspecified duration, initial encounter: Secondary | ICD-10-CM

## 2012-01-14 ENCOUNTER — Ambulatory Visit (HOSPITAL_COMMUNITY)
Admission: RE | Admit: 2012-01-14 | Discharge: 2012-01-14 | Disposition: A | Discharge status: Home / Self Care | Payer: Medicare Other | Source: Ambulatory Visit | Attending: Internal Medicine | Admitting: Internal Medicine

## 2012-01-14 DIAGNOSIS — R51 Headache: Secondary | ICD-10-CM | POA: Insufficient documentation

## 2012-01-14 DIAGNOSIS — S0990XA Unspecified injury of head, initial encounter: Secondary | ICD-10-CM | POA: Insufficient documentation

## 2012-01-14 DIAGNOSIS — S060X9A Concussion with loss of consciousness of unspecified duration, initial encounter: Secondary | ICD-10-CM

## 2012-01-14 DIAGNOSIS — W19XXXA Unspecified fall, initial encounter: Secondary | ICD-10-CM | POA: Insufficient documentation

## 2012-03-03 ENCOUNTER — Other Ambulatory Visit: Payer: Self-pay | Admitting: Neurology

## 2012-03-03 DIAGNOSIS — R269 Unspecified abnormalities of gait and mobility: Secondary | ICD-10-CM

## 2012-03-03 DIAGNOSIS — G609 Hereditary and idiopathic neuropathy, unspecified: Secondary | ICD-10-CM

## 2012-03-03 DIAGNOSIS — I2510 Atherosclerotic heart disease of native coronary artery without angina pectoris: Secondary | ICD-10-CM

## 2012-03-03 DIAGNOSIS — R413 Other amnesia: Secondary | ICD-10-CM

## 2012-03-09 ENCOUNTER — Ambulatory Visit
Admission: RE | Admit: 2012-03-09 | Discharge: 2012-03-09 | Disposition: A | Discharge status: Home / Self Care | Payer: Medicare Other | Source: Ambulatory Visit | Attending: Neurology | Admitting: Neurology

## 2012-03-09 DIAGNOSIS — R413 Other amnesia: Secondary | ICD-10-CM

## 2012-03-09 DIAGNOSIS — I2510 Atherosclerotic heart disease of native coronary artery without angina pectoris: Secondary | ICD-10-CM

## 2012-03-09 DIAGNOSIS — G609 Hereditary and idiopathic neuropathy, unspecified: Secondary | ICD-10-CM

## 2012-03-09 DIAGNOSIS — R269 Unspecified abnormalities of gait and mobility: Secondary | ICD-10-CM

## 2012-04-20 ENCOUNTER — Ambulatory Visit (INDEPENDENT_AMBULATORY_CARE_PROVIDER_SITE_OTHER): Payer: Medicare Other | Admitting: Cardiology

## 2012-04-20 ENCOUNTER — Encounter: Payer: Self-pay | Admitting: Cardiology

## 2012-04-20 VITALS — BP 120/66 | HR 59 | Ht 70.0 in | Wt 197.4 lb

## 2012-04-20 DIAGNOSIS — E785 Hyperlipidemia, unspecified: Secondary | ICD-10-CM

## 2012-04-20 DIAGNOSIS — G309 Alzheimer's disease, unspecified: Secondary | ICD-10-CM

## 2012-04-20 DIAGNOSIS — I25810 Atherosclerosis of coronary artery bypass graft(s) without angina pectoris: Secondary | ICD-10-CM

## 2012-04-20 DIAGNOSIS — I10 Essential (primary) hypertension: Secondary | ICD-10-CM

## 2012-04-20 DIAGNOSIS — F0280 Dementia in other diseases classified elsewhere without behavioral disturbance: Secondary | ICD-10-CM

## 2012-04-20 NOTE — Progress Notes (Signed)
HPI:  It seems like he still probably has some angina, but sometimes difficult to tell.  His main complaints revolve around spurs in his shoulders, and pain in his legs and hips.  He also has a neuropathy, and though he has struggled with memory issues and is on treatment, he seems unusually sharp about a number of items in discussion.  He asks some good questions.  There clearly has not been an advancement of ischemic symptoms on the part of the patient.  He seems to be getting along about the same, and really does not want to push forward with a cath procedure.    Current Outpatient Prescriptions  Medication Sig Dispense Refill  . ALPRAZolam (XANAX) 1 MG tablet Take 1 mg by mouth daily as needed. For anxiety       . amLODipine (NORVASC) 10 MG tablet Take 1 tablet (10 mg total) by mouth daily.  30 tablet  6  . aspirin EC 81 MG tablet Take 81 mg by mouth every morning.        . carvedilol (COREG) 3.125 MG tablet Take 3.125 mg by mouth every morning.       . clopidogrel (PLAVIX) 75 MG tablet Take 75 mg by mouth daily.        . cyclobenzaprine (FLEXERIL) 10 MG tablet Take one every 8 hours for muscle strain  20 tablet  0  . docusate sodium (COLACE) 100 MG capsule Take 100 mg by mouth daily.        [name] donepezil (ARICEPT) 10 MG tablet Take 10 mg by mouth daily.       . fentaNYL (DURAGESIC - DOSED MCG/HR) 75 MCG/HR Change patch every other day      . hydrochlorothiazide (,MICROZIDE/HYDRODIURIL,) 12.5 MG capsule Take 12.5 mg by mouth daily.       [name] HYDROcodone-acetaminophen (NORCO) 10-325 MG per tablet Take 1 tablet by mouth every 6 (six) hours as needed. For pain      . NAMENDA 5 MG tablet Take 5 mg by mouth daily.       . nitroGLYCERIN (NITROSTAT) 0.4 MG SL tablet Place 0.4 mg under the tongue every 5 (five) minutes as needed.        [name] oxyCODONE (ROXICODONE) 15 MG immediate release tablet Take 15 mg by mouth as needed. For pain        No Known Allergies  Past Medical History  Diagnosis Date  .  Arthritis   . CAD (coronary artery disease)   . Hyperlipidemia   . HTN (hypertension)   . MI (myocardial infarction)   . Hematuria   . OSA (obstructive sleep apnea)   . Dementia     Past Surgical History  Procedure Date  . Coronary artery bypass graft     1996 x5:   LIMA to LAD, SVG D2, SVG to PDA, SVG to OM1,2  . Hernia repair   . Coronary angioplasty with stent placement     Family History  Problem Relation Age of Onset  . Heart disease      History   Social History  . Marital Status: Married    Spouse Name: N/A    Number of Children: N/A  . Years of Education: N/A   Occupational History  . Not on file.   Social History Main Topics  . Smoking status: Former Smoker    Types: Cigarettes    Quit date: 12/27/1994  . Smokeless tobacco: Not on file  . Alcohol Use: No  .  Drug Use: No  . Sexually Active: Not on file   Other Topics Concern  . Not on file   Social History Narrative  . No narrative on file    ROS: Please see the HPI.  All other systems reviewed and negative.  PHYSICAL EXAM:  BP 120/66  Pulse 59  Ht 5\' 10"  (1.778 m)  Wt 197 lb 6.4 oz (89.54 kg)  BMI 28.32 kg/m2  General: Well developed, well nourished, in no acute distress. Head:  Normocephalic and atraumatic. Neck: no JVD Lungs: Some prolonged expiration without rales or wheezes.   Heart: Normal S1 and S2.  1-2/6 SEM.  No DM.   Abdomen:  Normal bowel sounds; soft; non tender; no organomegaly Pulses: Pulses normal in all 4 extremities. Extremities: No clubbing or cyanosis. No edema. Neurologic: Alert and oriented x 3.  EKG:  SB.  Incomplete LBBB.  ST and T abnormality non specific.  No definite change from prior tracing.    ASSESSMENT AND PLAN:

## 2012-04-20 NOTE — Patient Instructions (Signed)
Your physician recommends that you schedule a follow-up appointment in: 3 MONTHS  Your physician recommends that you continue on your current medications as directed. Please refer to the Current Medication list given to you today.   

## 2012-05-11 NOTE — Assessment & Plan Note (Signed)
See results of last cath.   SP PCI of the SVG to the RCA.  Has some symptoms, but not unstable, and has opted for medical treatment at this time.  Would likely favor continuing DAPT.

## 2012-05-11 NOTE — Assessment & Plan Note (Signed)
Does not tolerate statins, and with neuropathy, does not take.

## 2012-05-11 NOTE — Assessment & Plan Note (Signed)
On Aricept, but continues to seem pretty sharp to me

## 2012-05-11 NOTE — Assessment & Plan Note (Signed)
Controlled.  

## 2012-07-04 ENCOUNTER — Ambulatory Visit (INDEPENDENT_AMBULATORY_CARE_PROVIDER_SITE_OTHER): Payer: Medicare Other | Admitting: Urgent Care

## 2012-07-04 ENCOUNTER — Other Ambulatory Visit: Payer: Self-pay | Admitting: Internal Medicine

## 2012-07-04 ENCOUNTER — Encounter: Payer: Self-pay | Admitting: Urgent Care

## 2012-07-04 VITALS — BP 132/63 | HR 56 | Temp 98.0°F | Ht 66.0 in | Wt 194.4 lb

## 2012-07-04 DIAGNOSIS — K5900 Constipation, unspecified: Secondary | ICD-10-CM

## 2012-07-04 DIAGNOSIS — K5909 Other constipation: Secondary | ICD-10-CM

## 2012-07-04 DIAGNOSIS — K921 Melena: Secondary | ICD-10-CM

## 2012-07-04 LAB — CBC WITH DIFFERENTIAL/PLATELET
Eosinophils Absolute: 0.2 10*3/uL (ref 0.0–0.7)
Hemoglobin: 14.3 g/dL (ref 13.0–17.0)
Lymphocytes Relative: 28 % (ref 12–46)
Lymphs Abs: 2.1 10*3/uL (ref 0.7–4.0)
MCH: 31.6 pg (ref 26.0–34.0)
MCV: 90.5 fL (ref 78.0–100.0)
Monocytes Relative: 10 % (ref 3–12)
Neutrophils Relative %: 59 % (ref 43–77)
Platelets: 252 10*3/uL (ref 150–400)
RBC: 4.53 MIL/uL (ref 4.22–5.81)
WBC: 7.6 10*3/uL (ref 4.0–10.5)

## 2012-07-04 NOTE — Assessment & Plan Note (Addendum)
?   Etiology .  Colonoscopy w/ Dr Jena Gauss to determine etiology of hematochezia including benign anorectal source, diverticula, polyps or carcinoma. We did discuss the fact that he is at a slightly higher risk of GI bleeding given the fact that he is on Plavix, however the benefits of remaining on the plavix outweigh the risk of bleeding. We discussed the life threatening nature of an embolic event. He agrees to remain on plavix for this procedure. He also understands that a second procedure may be required since he is on plavix if he shows signs of significant bleeding or is in need of significant intervention that cannot be performed while on plavix. Other risks discussed include reaction to the medication, perforation, and infection. He agrees with all the above and consent will be obtained.  Phenergan 12.5mg  IV 30 min prior to procedure to augment sedation

## 2012-07-04 NOTE — Patient Instructions (Addendum)
You need a colonoscopy with Dr. Jena Gauss Please go get your lab work today. We will call you with results.

## 2012-07-04 NOTE — Assessment & Plan Note (Addendum)
Dustin [name] is a pleasant 78 y.o. male with chronic constipation most likely secondary to chronic narcotic use.  Will check electrolytes including calcium and TSH given reported history of abnormal TSH 6 months ago.    Continue miralax prn.

## 2012-07-04 NOTE — Progress Notes (Signed)
Referring Provider: Cassell Smiles., MD Primary Care Physician:  Cassell Smiles., MD Primary Gastroenterologist:  Dr. Jena Gauss  Chief Complaint  Patient presents with  . Constipation    HPI:  Dustin [name] is a 78 y.o. male here as a referral from Dr. [name] for chronic constipation.  He tells me he has had long-standing history of constipation. He has been on Opana for a couple yrs for chronic pain. He is also on a fentanyl patch. He tells me he has "sleepy bowels" and consists several days without a bowel movement.  He has tried mag citrate which helps but causes nausea.    He has also tried miralax and that was no help.   He has tried enemas once or twice.  He is not having any abdominal pain, nausea or vomiting.  He has noticed small amounts of bright red blood w/ wiping once.   He tells me recent thyroid labs were abnormal approximately 6 months ago and that he had medication adjusted at that time.   He denies any aspirin or NSAID products, however he is on Plavix.   Denies any upper GI symptoms including heartburn, indigestion, nausea, vomiting, dysphagia, odynophagia or anorexia.  Past Medical History  Diagnosis Date  . Arthritis   . CAD (coronary artery disease)   . Hyperlipidemia   . HTN (hypertension)   . MI (myocardial infarction)   . Hematuria   . OSA (obstructive sleep apnea)   . Dementia   . Anxiety   . OA (osteoarthritis)   . DDD (degenerative disc disease)     chronic back pain  . Alzheimer disease   . Enlarged prostate    Past Surgical History  Procedure Date  . Coronary artery bypass graft     1996 x5:   LIMA to LAD, SVG D2, SVG to PDA, SVG to OM1,2  . Hernia repair   . Coronary angioplasty with stent placement   . Colonoscopy 08/03/2004    Jenkins-numerous large diverticula in the descending, transverse, descending, and sigmoid colon. Otherwise normal exam.    Current Outpatient Prescriptions  Medication Sig Dispense Refill  . ALPRAZolam (XANAX) 1 MG tablet  Take 1 mg by mouth daily as needed. For anxiety       . amLODipine (NORVASC) 10 MG tablet Take 1 tablet (10 mg total) by mouth daily.  30 tablet  6  . carvedilol (COREG) 3.125 MG tablet Take 3.125 mg by mouth every morning.       . clopidogrel (PLAVIX) 75 MG tablet Take 75 mg by mouth daily.        . fentaNYL (DURAGESIC - DOSED MCG/HR) 100 MCG/HR Place 1 patch onto the skin every 3 (three) days.       . hydrochlorothiazide (,MICROZIDE/HYDRODIURIL,) 12.5 MG capsule Take 10 mg by mouth daily.       [name] levothyroxine (SYNTHROID, LEVOTHROID) 25 MCG tablet Take 25 mcg by mouth daily.       [name] NAMENDA 5 MG tablet Take 5 mg by mouth daily.       . nitroGLYCERIN (NITROSTAT) 0.4 MG SL tablet Place 0.4 mg under the tongue every 5 (five) minutes as needed.        [name] oxyCODONE (ROXICODONE) 15 MG immediate release tablet Take 15 mg by mouth as needed. For pain      . oxymorphone (OPANA) 10 MG tablet Take 10 mg by mouth every 6 (six) hours as needed.       . pravastatin (PRAVACHOL) 40  MG tablet Take 40 mg by mouth daily.         Allergies as of 07/04/2012  . (No Known Allergies)    Family History:There is no known family history of colorectal carcinoma , liver disease, or inflammatory bowel disease.  Problem Relation Age of Onset  . Heart disease      History   Social History  . Marital Status: Married    Spouse Name: N/A    Number of Children: N/A  . Years of Education: N/A   Occupational History  . retired    Social History Main Topics  . Smoking status: Former Smoker    Types: Cigarettes    Quit date: 12/27/1994  . Smokeless tobacco: Not on file  . Alcohol Use: No  . Drug Use: No  . Sexually Active: Not on file  Review of Systems: Gen: Denies any fever, chills, sweats, anorexia, fatigue, weakness, malaise, weight loss, and sleep disorder CV: Denies chest pain, angina, palpitations, syncope, orthopnea, PND, peripheral edema, and claudication. Resp: Denies dyspnea at rest, dyspnea with  exercise, cough, sputum, wheezing, coughing up blood, and pleurisy. GI: Denies vomiting blood, jaundice, and fecal incontinence.    GU : Denies urinary burning, blood in urine, urinary frequency, urinary hesitancy, nocturnal urination, and urinary incontinence. MS: Chronic back pain on narcotic therapy for years. Derm: Denies rash, itching, dry skin, hives, moles, warts, or unhealing ulcers.  Psych: Denies depression, anxiety, memory loss, suicidal ideation, hallucinations, paranoia, and confusion. Heme: Denies bruising, bleeding, and enlarged lymph nodes. Neuro:  Denies any headaches, dizziness, paresthesias. Endo:  Denies any problems with DM, thyroid, adrenal function.  Physical Exam: BP 132/63  Pulse 56  Temp 98 F (36.7 C) (Temporal)  Ht 5\' 6"  (1.676 m)  Wt 194 lb 6.4 oz (88.179 kg)  BMI 31.38 kg/m2 General:   Alert,  Well-developed, well-nourished, pleasant and cooperative in NAD Head:  Normocephalic and atraumatic. Eyes:  Sclera clear, no icterus.   Conjunctiva pink. Ears:  Normal auditory acuity. Nose:  No deformity, discharge, or lesions. Mouth:  No deformity or lesions,oropharynx pink & moist. Neck:  Supple; no masses or thyromegaly. Lungs:  Clear throughout to auscultation.   No wheezes, crackles, or rhonchi. No acute distress. Heart:  Regular rate and rhythm; no murmurs, clicks, rubs,  or gallops. Abdomen:  Normal bowel sounds.  No bruits.  Soft, non-tender and non-distended without masses, hepatosplenomegaly or hernias noted.  No guarding or rebound tenderness.   Rectal:  Deferred. Msk:  Symmetrical without gross deformities. Normal posture. Pulses:  Normal pulses noted. Extremities:  No edema. Neurologic:  Alert and oriented x4;  grossly normal neurologically. Skin:  Intact without significant lesions or rashes. Lymph Nodes:  No significant cervical adenopathy. Psych:  Alert and cooperative. Normal mood and affect.

## 2012-07-05 ENCOUNTER — Encounter (HOSPITAL_COMMUNITY): Payer: Self-pay | Admitting: Pharmacy Technician

## 2012-07-05 LAB — BASIC METABOLIC PANEL
CO2: 34 mEq/L — ABNORMAL HIGH (ref 19–32)
Calcium: 9.6 mg/dL (ref 8.4–10.5)
Chloride: 102 mEq/L (ref 96–112)
Sodium: 140 mEq/L (ref 135–145)

## 2012-07-05 NOTE — Progress Notes (Signed)
Forwarded to Dawn  

## 2012-07-07 ENCOUNTER — Emergency Department (HOSPITAL_COMMUNITY)
Admission: EM | Admit: 2012-07-07 | Discharge: 2012-07-08 | Disposition: A | Discharge status: Home / Self Care | Payer: Medicare Other | Attending: Emergency Medicine | Admitting: Emergency Medicine

## 2012-07-07 ENCOUNTER — Encounter (HOSPITAL_COMMUNITY): Payer: Self-pay

## 2012-07-07 ENCOUNTER — Other Ambulatory Visit: Payer: Self-pay

## 2012-07-07 ENCOUNTER — Emergency Department (HOSPITAL_COMMUNITY): Payer: Medicare Other

## 2012-07-07 DIAGNOSIS — G4733 Obstructive sleep apnea (adult) (pediatric): Secondary | ICD-10-CM | POA: Insufficient documentation

## 2012-07-07 DIAGNOSIS — I10 Essential (primary) hypertension: Secondary | ICD-10-CM | POA: Insufficient documentation

## 2012-07-07 DIAGNOSIS — Z8739 Personal history of other diseases of the musculoskeletal system and connective tissue: Secondary | ICD-10-CM | POA: Insufficient documentation

## 2012-07-07 DIAGNOSIS — F0280 Dementia in other diseases classified elsewhere without behavioral disturbance: Secondary | ICD-10-CM | POA: Insufficient documentation

## 2012-07-07 DIAGNOSIS — Y93H2 Activity, gardening and landscaping: Secondary | ICD-10-CM | POA: Insufficient documentation

## 2012-07-07 DIAGNOSIS — R531 Weakness: Secondary | ICD-10-CM

## 2012-07-07 DIAGNOSIS — R5383 Other fatigue: Secondary | ICD-10-CM | POA: Insufficient documentation

## 2012-07-07 DIAGNOSIS — R079 Chest pain, unspecified: Secondary | ICD-10-CM | POA: Insufficient documentation

## 2012-07-07 DIAGNOSIS — G309 Alzheimer's disease, unspecified: Secondary | ICD-10-CM | POA: Insufficient documentation

## 2012-07-07 DIAGNOSIS — E785 Hyperlipidemia, unspecified: Secondary | ICD-10-CM | POA: Insufficient documentation

## 2012-07-07 DIAGNOSIS — I2510 Atherosclerotic heart disease of native coronary artery without angina pectoris: Secondary | ICD-10-CM | POA: Insufficient documentation

## 2012-07-07 DIAGNOSIS — E079 Disorder of thyroid, unspecified: Secondary | ICD-10-CM | POA: Insufficient documentation

## 2012-07-07 DIAGNOSIS — T675XXA Heat exhaustion, unspecified, initial encounter: Secondary | ICD-10-CM | POA: Insufficient documentation

## 2012-07-07 DIAGNOSIS — X30XXXA Exposure to excessive natural heat, initial encounter: Secondary | ICD-10-CM | POA: Insufficient documentation

## 2012-07-07 DIAGNOSIS — Y92009 Unspecified place in unspecified non-institutional (private) residence as the place of occurrence of the external cause: Secondary | ICD-10-CM | POA: Insufficient documentation

## 2012-07-07 DIAGNOSIS — R5381 Other malaise: Secondary | ICD-10-CM | POA: Insufficient documentation

## 2012-07-07 DIAGNOSIS — Z79899 Other long term (current) drug therapy: Secondary | ICD-10-CM | POA: Insufficient documentation

## 2012-07-07 LAB — URINE MICROSCOPIC-ADD ON

## 2012-07-07 LAB — CBC
Hemoglobin: 14.4 g/dL (ref 13.0–17.0)
MCH: 31.4 pg (ref 26.0–34.0)
MCV: 93.9 fL (ref 78.0–100.0)
RBC: 4.58 MIL/uL (ref 4.22–5.81)

## 2012-07-07 LAB — URINALYSIS, ROUTINE W REFLEX MICROSCOPIC
Glucose, UA: NEGATIVE mg/dL
Ketones, ur: NEGATIVE mg/dL
Leukocytes, UA: NEGATIVE
Specific Gravity, Urine: 1.015 (ref 1.005–1.030)
pH: 7.5 (ref 5.0–8.0)

## 2012-07-07 LAB — BASIC METABOLIC PANEL
CO2: 31 mEq/L (ref 19–32)
Calcium: 9.7 mg/dL (ref 8.4–10.5)
Chloride: 99 mEq/L (ref 96–112)
Creatinine, Ser: 0.63 mg/dL (ref 0.50–1.35)
Glucose, Bld: 113 mg/dL — ABNORMAL HIGH (ref 70–99)
Sodium: 138 mEq/L (ref 135–145)

## 2012-07-07 MED ORDER — NITROGLYCERIN 2 % TD OINT
1.0000 [in_us] | TOPICAL_OINTMENT | Freq: Once | TRANSDERMAL | Status: AC
  Administered 2012-07-07: 1 [in_us] via TOPICAL
  Filled 2012-07-07: qty 1

## 2012-07-07 MED ORDER — SODIUM CHLORIDE 0.9 % IV BOLUS (SEPSIS)
500.0000 mL | INTRAVENOUS | Status: AC
  Administered 2012-07-07: 500 mL via INTRAVENOUS

## 2012-07-07 MED ORDER — ASPIRIN 81 MG PO CHEW
324.0000 mg | CHEWABLE_TABLET | Freq: Once | ORAL | Status: AC
  Administered 2012-07-07: 324 mg via ORAL
  Filled 2012-07-07: qty 4

## 2012-07-07 NOTE — ED Notes (Signed)
Pt reports for the past 2 or 3 days has been having a "cutting pain" in left chest.  Reports had some stents put in last August.  C/O generalized weakness and nausea.  Also says left chest feels sore, also c/o SOB.

## 2012-07-07 NOTE — ED Provider Notes (Signed)
History   This chart was scribed for Ward Givens, MD by Sofie Rower. The patient was seen in room APA08/APA08 and the patient's care was started at 5:31 PM     CSN: [id number]  Arrival date & time 07/07/12  1652   First MD Initiated Contact with Patient 07/07/12 1718      Chief Complaint  Patient presents with  . Chest Pain    (Consider location/radiation/quality/duration/timing/severity/associated sxs/prior treatment) HPI  Dustin [name] is a 78 y.o. male who presents to the Emergency Department complaining of chest pain located at the left chest onset for the past couple of days with associated symptoms of shortness of breath, nausea, weakness (onset today). The pt informs the EDP that he feels as if there is a scratching or knife or pin scratching in his left chest and feels like something is moving around in his left chest.The pt was out working in the yard and flowerbed today for about 2 hours, where he got hot and sweaty, then felt "[place] headed and nauseated. He went into the house and laid down and took phenergan, but no NTG.  The pt reports the chest pains (angina) feels like his heart is pumping, and something passes through it. The pains are sharp and shooting. The pt informs the EDP that the pains come right after the other, in a series. He usually takes 2 NTG and it goes away. The pt also informs the EDP that he was out working with some trees and bushes, cutting them down in the yard, last week, where he feels as if he may have overexerted himself.  Modifying factors include taking nitroglycerin which provides moderate relief.   Pt has a hx of stent placement (August 2012).  Pt denies swelling in the legs, fever, allergies to any medications.   PCP is Dr. [name].  Cardiologist is Dr. [name]   Past Medical History  Diagnosis Date  . Arthritis   . CAD (coronary artery disease)   . Hyperlipidemia   . HTN (hypertension)   . MI (myocardial infarction)   . Hematuria   .  OSA (obstructive sleep apnea)   . Dementia   . Anxiety   . OA (osteoarthritis)   . DDD (degenerative disc disease)     chronic back pain  . Alzheimer disease   . Enlarged prostate   . Thyroid disease     Past Surgical History  Procedure Date  . Coronary artery bypass graft     1996 x5:   LIMA to LAD, SVG D2, SVG to PDA, SVG to OM1,2  . Hernia repair   . Coronary angioplasty with stent placement   . Colonoscopy 08/03/2004    Jenkins-numerous large diverticula in the descending, transverse, descending, and sigmoid colon. Otherwise normal exam.    Family History  Problem Relation Age of Onset  . Heart disease      History  Substance Use Topics  . Smoking status: Former Smoker    Types: Cigarettes    Quit date: 12/27/1994  . Smokeless tobacco: Not on file  . Alcohol Use: No   Pt quit smoking (1996).  lives at home Lives with wife  Review of Systems  All other systems reviewed and are negative.   10 Systems reviewed and all are negative for acute change except as noted in the HPI.   Allergies  Review of patient's allergies indicates no known allergies.  Home Medications   Current Outpatient Rx  Name Route Sig Dispense Refill  .  ALPRAZOLAM 1 MG PO TABS Oral Take 1 mg by mouth daily as needed. For anxiety     . AMLODIPINE BESYLATE 10 MG PO TABS Oral Take 1 tablet (10 mg total) by mouth daily. 30 tablet 6  . CARVEDILOL 3.125 MG PO TABS Oral Take 3.125 mg by mouth every morning.     [name] CLOPIDOGREL BISULFATE 75 MG PO TABS Oral Take 75 mg by mouth daily.      . DONEPEZIL HCL 10 MG PO TABS Oral Take 1 tablet by mouth At bedtime.    . FENTANYL 100 MCG/HR TD PT72 Transdermal Place 1 patch onto the skin every 3 (three) days.     [name] HYDROCHLOROTHIAZIDE 12.5 MG PO CAPS Oral Take 10 mg by mouth daily.     [name] LEVOTHYROXINE SODIUM 25 MCG PO TABS Oral Take 25 mcg by mouth daily.     [name] NAMENDA 5 MG PO TABS Oral Take 5 mg by mouth daily.     [name] NITROGLYCERIN 0.4 MG SL SUBL Sublingual  Place 0.4 mg under the tongue every 5 (five) minutes as needed. For chest pains    . OXYCODONE HCL 15 MG PO TABS Oral Take 15 mg by mouth as needed. For pain    . OXYMORPHONE HCL 10 MG PO TABS Oral Take 10 mg by mouth every 6 (six) hours as needed.     [name] PRAVASTATIN SODIUM 40 MG PO TABS Oral Take 40 mg by mouth daily.       BP 144/63  Pulse 48  Temp 97.8 F (36.6 C) (Oral)  Resp 18  Ht 5\' 10"  (1.778 m)  Wt 195 lb (88.451 kg)  BMI 27.98 kg/m2  SpO2 97%  Vital signs normal except bradycardia   Physical Exam  Nursing note and vitals reviewed. Constitutional: He is oriented to person, place, and time. He appears well-developed and well-nourished.  HENT:  Head: Normocephalic and atraumatic.  Right Ear: External ear normal.  Left Ear: External ear normal.  Nose: Nose normal.       Tongue mildly dry  Eyes: Conjunctivae and EOM are normal. Pupils are equal, round, and reactive to light.       Miotic pupils.   Neck: Normal range of motion. Neck supple.  Cardiovascular: Regular rhythm, normal heart sounds and normal pulses.  Bradycardia present.   Pulmonary/Chest: Effort normal and breath sounds normal. No respiratory distress. He has no wheezes. He has no rales. He exhibits no tenderness.       No pain on ROM of left arm.   Abdominal: Soft. Bowel sounds are normal. There is no tenderness. There is no rebound and no guarding.  Musculoskeletal: Normal range of motion. He exhibits no edema and no tenderness.  Neurological: He is alert and oriented to person, place, and time.  Skin: Skin is warm and dry.  Psychiatric: His behavior is normal.       Affect flat    ED Course  Procedures (including critical care time)  5:42PM- EDP at bedside discusses treatment plan concerning x-ray.   7:34PM- EDP at bedside. Pt reports nausea relief. EDP discusses elimination of possible MI.   Pt had stable orthostatic VS, he remained bradycardic with stable BP. Pt ambulated in the hall with Nursing  Tech and appeared to be fine.   9:51PM- EDP at bedside discusses treatment plan concerning  Discharge.   Results for orders placed during the hospital encounter of 07/07/12  CBC      Component Value Range  WBC 9.0  4.0 - 10.5 K/uL   RBC 4.58  4.22 - 5.81 MIL/uL   Hemoglobin 14.4  13.0 - 17.0 g/dL   HCT 16.1  09.6 - 04.5 %   MCV 93.9  78.0 - 100.0 fL   MCH 31.4  26.0 - 34.0 pg   MCHC 33.5  30.0 - 36.0 g/dL   RDW 40.9  81.1 - 91.4 %   Platelets 217  150 - 400 K/uL  BASIC METABOLIC PANEL      Component Value Range   Sodium 138  135 - 145 mEq/L   Potassium 3.4 (*) 3.5 - 5.1 mEq/L   Chloride 99  96 - 112 mEq/L   CO2 31  19 - 32 mEq/L   Glucose, Bld 113 (*) 70 - 99 mg/dL   BUN 16  6 - 23 mg/dL   Creatinine, Ser 7.82  0.50 - 1.35 mg/dL   Calcium 9.7  8.4 - 95.6 mg/dL   GFR calc non Af Amer >90  >90 mL/min   GFR calc Af Amer >90  >90 mL/min  TROPONIN I      Component Value Range   Troponin I <0.30  <0.30 ng/mL  URINALYSIS, ROUTINE W REFLEX MICROSCOPIC      Component Value Range   Color, Urine YELLOW  YELLOW   APPearance CLOUDY (*) CLEAR   Specific Gravity, Urine 1.015  1.005 - 1.030   pH 7.5  5.0 - 8.0   Glucose, UA NEGATIVE  NEGATIVE mg/dL   Hgb urine dipstick TRACE (*) NEGATIVE   Bilirubin Urine NEGATIVE  NEGATIVE   Ketones, ur NEGATIVE  NEGATIVE mg/dL   Protein, ur NEGATIVE  NEGATIVE mg/dL   Urobilinogen, UA 0.2  0.0 - 1.0 mg/dL   Nitrite NEGATIVE  NEGATIVE   Leukocytes, UA NEGATIVE  NEGATIVE  URINE MICROSCOPIC-ADD ON      Component Value Range   WBC, UA 0-2  <3 WBC/hpf   RBC / HPF 3-6  <3 RBC/hpf   Bacteria, UA FEW (*) RARE   Laboratory interpretation all normal except mild hypokalemia   Chest Portable 1 View  07/07/2012  *RADIOLOGY REPORT*  Clinical Data: Chest pain and shortness of breath.  PORTABLE CHEST - 1 VIEW  Comparison: 08/05/2011.  Findings: Trachea is midline.  Heart size stable.  Thoracic aorta is calcified. A focal opacity at the medial right lung base  appears accentuated when compared with prior examinations, possibly due to technique.  Lungs are otherwise clear.  No pleural fluid.  IMPRESSION:  Opacity at the right lung base appears slightly accentuated when compared with prior examinations, possibly due to technique. Follow-up PA and lateral views of the chest could be performed in further evaluation, as clinically indicated.  Original Report Authenticated By: Reyes Ivan, M.D.      Date: 07/07/2012  Rate: 47  Rhythm: sinus bradycardia  QRS Axis: normal  Intervals: normal  ST/T Wave abnormalities: nonspecific T wave changes  Conduction Disutrbances:nonspecific intraventricular conduction delay  Narrative Interpretation:   Old EKG Reviewed: unchanged from 08/06/2011     1. Weakness   2. Chest pain   3. Heat exhaustion    Plan discharge  Devoria Albe, MD, FACEP    MDM   I personally performed the services described in this documentation, which was scribed in my presence. The recorded information has been reviewed and considered.  Devoria Albe, MD, Armando Gang   Ward Givens, MD 07/07/12 [phone]

## 2012-07-08 NOTE — ED Notes (Signed)
Patient has ambulated and heart rate did not change, 02 sats remained constant.   Patient is anxious to leave and eat some dinner.  Skin warm and dry color good.  Wife with patient - left to get him some food and bring it back.

## 2012-07-10 NOTE — Progress Notes (Signed)
Faxed to PCP

## 2012-07-10 NOTE — Progress Notes (Signed)
Quick Note:  Please call pt & let him know that his blood ct, thyroid & electrolytes all look fine. Proceed w/ colonoscopy as planned. Thanks ZO:XWRUE,[id number] J., MD  ______

## 2012-07-12 ENCOUNTER — Ambulatory Visit (HOSPITAL_COMMUNITY)
Admission: RE | Admit: 2012-07-12 | Discharge: 2012-07-12 | Disposition: A | Discharge status: Home / Self Care | Payer: Medicare Other | Source: Ambulatory Visit | Attending: Internal Medicine | Admitting: Internal Medicine

## 2012-07-12 ENCOUNTER — Encounter (HOSPITAL_COMMUNITY): Payer: Self-pay | Admitting: *Deleted

## 2012-07-12 ENCOUNTER — Encounter (HOSPITAL_COMMUNITY)
Admission: RE | Disposition: A | Discharge status: Home / Self Care | Payer: Self-pay | Source: Ambulatory Visit | Attending: Internal Medicine

## 2012-07-12 DIAGNOSIS — K921 Melena: Secondary | ICD-10-CM | POA: Insufficient documentation

## 2012-07-12 DIAGNOSIS — G4733 Obstructive sleep apnea (adult) (pediatric): Secondary | ICD-10-CM | POA: Insufficient documentation

## 2012-07-12 DIAGNOSIS — K5730 Diverticulosis of large intestine without perforation or abscess without bleeding: Secondary | ICD-10-CM

## 2012-07-12 DIAGNOSIS — E785 Hyperlipidemia, unspecified: Secondary | ICD-10-CM | POA: Insufficient documentation

## 2012-07-12 DIAGNOSIS — D126 Benign neoplasm of colon, unspecified: Secondary | ICD-10-CM | POA: Insufficient documentation

## 2012-07-12 DIAGNOSIS — D129 Benign neoplasm of anus and anal canal: Secondary | ICD-10-CM | POA: Insufficient documentation

## 2012-07-12 DIAGNOSIS — Z79899 Other long term (current) drug therapy: Secondary | ICD-10-CM | POA: Insufficient documentation

## 2012-07-12 DIAGNOSIS — K5909 Other constipation: Secondary | ICD-10-CM

## 2012-07-12 DIAGNOSIS — I10 Essential (primary) hypertension: Secondary | ICD-10-CM | POA: Insufficient documentation

## 2012-07-12 DIAGNOSIS — K621 Rectal polyp: Secondary | ICD-10-CM

## 2012-07-12 DIAGNOSIS — D128 Benign neoplasm of rectum: Secondary | ICD-10-CM | POA: Insufficient documentation

## 2012-07-12 DIAGNOSIS — K620 Anal polyp: Secondary | ICD-10-CM

## 2012-07-12 DIAGNOSIS — K644 Residual hemorrhoidal skin tags: Secondary | ICD-10-CM | POA: Insufficient documentation

## 2012-07-12 HISTORY — PX: COLONOSCOPY: SHX5424

## 2012-07-12 SURGERY — COLONOSCOPY
Anesthesia: Moderate Sedation

## 2012-07-12 MED ORDER — SODIUM CHLORIDE 0.45 % IV SOLN
Freq: Once | INTRAVENOUS | Status: AC
  Administered 2012-07-12: 08:00:00 via INTRAVENOUS

## 2012-07-12 MED ORDER — PROMETHAZINE HCL 25 MG/ML IJ SOLN
INTRAMUSCULAR | Status: AC
  Filled 2012-07-12: qty 1

## 2012-07-12 MED ORDER — MIDAZOLAM HCL 5 MG/5ML IJ SOLN
INTRAMUSCULAR | Status: AC
  Filled 2012-07-12: qty 10

## 2012-07-12 MED ORDER — MIDAZOLAM HCL 5 MG/5ML IJ SOLN
INTRAMUSCULAR | Status: DC | PRN
  Administered 2012-07-12: 2 mg via INTRAVENOUS
  Administered 2012-07-12: 1 mg via INTRAVENOUS

## 2012-07-12 MED ORDER — STERILE WATER FOR IRRIGATION IR SOLN
Status: DC | PRN
  Administered 2012-07-12: 09:00:00

## 2012-07-12 MED ORDER — PROMETHAZINE HCL 25 MG/ML IJ SOLN
12.5000 mg | Freq: Once | INTRAMUSCULAR | Status: AC
  Administered 2012-07-12: 12.5 mg via INTRAVENOUS

## 2012-07-12 MED ORDER — MEPERIDINE HCL 100 MG/ML IJ SOLN
INTRAMUSCULAR | Status: DC | PRN
  Administered 2012-07-12: 25 mg via INTRAVENOUS
  Administered 2012-07-12: 50 mg via INTRAVENOUS

## 2012-07-12 MED ORDER — SODIUM CHLORIDE 0.9 % IJ SOLN
INTRAMUSCULAR | Status: AC
  Filled 2012-07-12: qty 10

## 2012-07-12 MED ORDER — MEPERIDINE HCL 100 MG/ML IJ SOLN
INTRAMUSCULAR | Status: AC
  Filled 2012-07-12: qty 2

## 2012-07-12 NOTE — H&P (View-Only) (Signed)
Referring Provider: Fusco, Lawrence J., MD Primary Care Physician:  FUSCO,LAWRENCE J., MD Primary Gastroenterologist:  Dr. Rourk  Chief Complaint  Patient presents with  . Constipation    HPI:  Dustin [name] is a 77 y.o. male here as a referral from Dr. Fusco for chronic constipation.  He tells me he has had long-standing history of constipation. He has been on Opana for a couple yrs for chronic pain. He is also on a fentanyl patch. He tells me he has "sleepy bowels" and consists several days without a bowel movement.  He has tried mag citrate which helps but causes nausea.    He has also tried miralax and that was no help.   He has tried enemas once or twice.  He is not having any abdominal pain, nausea or vomiting.  He has noticed small amounts of bright red blood w/ wiping once.   He tells me recent thyroid labs were abnormal approximately 6 months ago and that he had medication adjusted at that time.   He denies any aspirin or NSAID products, however he is on Plavix.   Denies any upper GI symptoms including heartburn, indigestion, nausea, vomiting, dysphagia, odynophagia or anorexia.  Past Medical History  Diagnosis Date  . Arthritis   . CAD (coronary artery disease)   . Hyperlipidemia   . HTN (hypertension)   . MI (myocardial infarction)   . Hematuria   . OSA (obstructive sleep apnea)   . Dementia   . Anxiety   . OA (osteoarthritis)   . DDD (degenerative disc disease)     chronic back pain  . Alzheimer disease   . Enlarged prostate    Past Surgical History  Procedure Date  . Coronary artery bypass graft     1996 x5:   LIMA to LAD, SVG D2, SVG to PDA, SVG to OM1,2  . Hernia repair   . Coronary angioplasty with stent placement   . Colonoscopy 08/03/2004    Jenkins-numerous large diverticula in the descending, transverse, descending, and sigmoid colon. Otherwise normal exam.    Current Outpatient Prescriptions  Medication Sig Dispense Refill  . ALPRAZolam (XANAX) 1 MG tablet  Take 1 mg by mouth daily as needed. For anxiety       . amLODipine (NORVASC) 10 MG tablet Take 1 tablet (10 mg total) by mouth daily.  30 tablet  6  . carvedilol (COREG) 3.125 MG tablet Take 3.125 mg by mouth every morning.       . clopidogrel (PLAVIX) 75 MG tablet Take 75 mg by mouth daily.        . fentaNYL (DURAGESIC - DOSED MCG/HR) 100 MCG/HR Place 1 patch onto the skin every 3 (three) days.       . hydrochlorothiazide (,MICROZIDE/HYDRODIURIL,) 12.5 MG capsule Take 10 mg by mouth daily.       . levothyroxine (SYNTHROID, LEVOTHROID) 25 MCG tablet Take 25 mcg by mouth daily.       . NAMENDA 5 MG tablet Take 5 mg by mouth daily.       . nitroGLYCERIN (NITROSTAT) 0.4 MG SL tablet Place 0.4 mg under the tongue every 5 (five) minutes as needed.        . oxyCODONE (ROXICODONE) 15 MG immediate release tablet Take 15 mg by mouth as needed. For pain      . oxymorphone (OPANA) 10 MG tablet Take 10 mg by mouth every 6 (six) hours as needed.       . pravastatin (PRAVACHOL) 40   MG tablet Take 40 mg by mouth daily.         Allergies as of 07/04/2012  . (No Known Allergies)    Family History:There is no known family history of colorectal carcinoma , liver disease, or inflammatory bowel disease.  Problem Relation Age of Onset  . Heart disease      History   Social History  . Marital Status: Married    Spouse Name: N/A    Number of Children: N/A  . Years of Education: N/A   Occupational History  . retired    Social History Main Topics  . Smoking status: Former Smoker    Types: Cigarettes    Quit date: 12/27/1994  . Smokeless tobacco: Not on file  . Alcohol Use: No  . Drug Use: No  . Sexually Active: Not on file  Review of Systems: Gen: Denies any fever, chills, sweats, anorexia, fatigue, weakness, malaise, weight loss, and sleep disorder CV: Denies chest pain, angina, palpitations, syncope, orthopnea, PND, peripheral edema, and claudication. Resp: Denies dyspnea at rest, dyspnea with  exercise, cough, sputum, wheezing, coughing up blood, and pleurisy. GI: Denies vomiting blood, jaundice, and fecal incontinence.    GU : Denies urinary burning, blood in urine, urinary frequency, urinary hesitancy, nocturnal urination, and urinary incontinence. MS: Chronic back pain on narcotic therapy for years. Derm: Denies rash, itching, dry skin, hives, moles, warts, or unhealing ulcers.  Psych: Denies depression, anxiety, memory loss, suicidal ideation, hallucinations, paranoia, and confusion. Heme: Denies bruising, bleeding, and enlarged lymph nodes. Neuro:  Denies any headaches, dizziness, paresthesias. Endo:  Denies any problems with DM, thyroid, adrenal function.  Physical Exam: BP 132/63  Pulse 56  Temp 98 F (36.7 C) (Temporal)  Ht 5' 6" (1.676 m)  Wt 194 lb 6.4 oz (88.179 kg)  BMI 31.38 kg/m2 General:   Alert,  Well-developed, well-nourished, pleasant and cooperative in NAD Head:  Normocephalic and atraumatic. Eyes:  Sclera clear, no icterus.   Conjunctiva pink. Ears:  Normal auditory acuity. Nose:  No deformity, discharge, or lesions. Mouth:  No deformity or lesions,oropharynx pink & moist. Neck:  Supple; no masses or thyromegaly. Lungs:  Clear throughout to auscultation.   No wheezes, crackles, or rhonchi. No acute distress. Heart:  Regular rate and rhythm; no murmurs, clicks, rubs,  or gallops. Abdomen:  Normal bowel sounds.  No bruits.  Soft, non-tender and non-distended without masses, hepatosplenomegaly or hernias noted.  No guarding or rebound tenderness.   Rectal:  Deferred. Msk:  Symmetrical without gross deformities. Normal posture. Pulses:  Normal pulses noted. Extremities:  No edema. Neurologic:  Alert and oriented x4;  grossly normal neurologically. Skin:  Intact without significant lesions or rashes. Lymph Nodes:  No significant cervical adenopathy. Psych:  Alert and cooperative. Normal mood and affect. 

## 2012-07-12 NOTE — Interval H&P Note (Signed)
History and Physical Interval Note:  07/12/2012 8:42 AM  Dustin [name]  has presented today for surgery, with the diagnosis of Hematochezia, chronic constipation  The various methods of treatment have been discussed with the patient and family. After consideration of risks, benefits and other options for treatment, the patient has consented to  Procedure(s) (LRB): COLONOSCOPY (N/A) as a surgical intervention .  The patient's history has been reviewed, patient examined, no change in status, stable for surgery.  I have reviewed the patients' chart and labs.  Questions were answered to the patient's satisfaction.     Eula Listen

## 2012-07-12 NOTE — Op Note (Signed)
[facility] [address] [place], [place]  [address]  COLONOSCOPY PROCEDURE REPORT  PATIENT:  Dustin [name], Dustin [name]  MR#:  [id number] BIRTHDATE:  February 24, 1934, 77 yrs. old  GENDER:  male ENDOSCOPIST:  R. Roetta Sessions, MD FACP [facility] REF. BY:  Artis Delay, M.D. PROCEDURE DATE:  07/12/2012 PROCEDURE:  Colonoscopy with snare polypectomy and polyp ablation  INDICATIONS:  Hematochezia and constipation  INFORMED CONSENT:  The risks, benefits, alternatives and imponderables including but not limited to bleeding, perforation as well as the possibility of a missed lesion have been reviewed. The potential for biopsy, lesion removal, etc. have also been discussed.  Questions have been answered.  All parties agreeable. Please see the history and physical in the medical record for more information.  MEDICATIONS:  Versed 3 mg IV and Demerol 75 mg IV in divided doses. 12.5 mg IV  DESCRIPTION OF PROCEDURE:  After a digital rectal exam was performed, the EC-[id number] ([id number]) colonoscope was advanced from the anus through the rectum and colon to the area of the cecum, ileocecal valve and appendiceal orifice.  The cecum was deeply intubated.  These structures were well-seen and photographed for the record.  From the level of the cecum and ileocecal valve, the scope was slowly and cautiously withdrawn.  The mucosal surfaces were carefully surveyed utilizing scope tip deflection to facilitate fold flattening as needed.  The scope was pulled down into the rectum where a thorough examination including retroflexion was performed. <<PROCEDUREIMAGES>>  FINDINGS: Adequate preparation. Single external hemorrhoidal tag. 4 mm polyp in the distal rectum at 2 cm in from the anal verge. 7 mm pedunculated polyp in the proximal rectum at 15 cm with multiple adjacent diminutive polyps. Pancolonic diverticulosis. Sessile  6 mm polyp opposite the ileocecal valve with adjacent diminutive polyp. There was a 8 mm  polyp on a fold in the mid ascending segment. The remainder of the colonic mucosa appeared normal  THERAPEUTIC / DIAGNOSTIC MANEUVERS PERFORMED: All polyps were either hot snare removed or ablated with the tip of the hot snare loop.  COMPLICATIONS:  None  CECAL WITHDRAWAL TIME: 20 minutes  IMPRESSION: External hemorrhoidal tag; multiple rectal and colonic polyps removed and/or treated as described above. Pancolonic diverticulosis  RECOMMENDATIONS: Daily fiber. Course of Anusol-HC suppositories. MiraLax nightly for no bowel movement on any given day. Followup on pathology.  ______________________________ R. Roetta Sessions, MD Caleen Essex  CC:  Artis Delay, M.D.  n. eSIGNED:   R. Roetta Sessions at 07/12/2012 09:25 AM  Gracelyn Nurse, [phone]

## 2012-07-14 ENCOUNTER — Encounter: Payer: Self-pay | Admitting: *Deleted

## 2012-07-14 ENCOUNTER — Encounter: Payer: Self-pay | Admitting: Internal Medicine

## 2012-07-19 ENCOUNTER — Telehealth: Payer: Self-pay | Admitting: Urgent Care

## 2012-07-19 ENCOUNTER — Encounter (HOSPITAL_COMMUNITY): Payer: Self-pay | Admitting: Internal Medicine

## 2012-07-19 ENCOUNTER — Other Ambulatory Visit: Payer: Self-pay | Admitting: *Deleted

## 2012-07-19 MED ORDER — NITROGLYCERIN 0.4 MG SL SUBL: 0.4000 mg | SUBLINGUAL_TABLET | SUBLINGUAL | Status: DC | PRN

## 2012-07-19 NOTE — Telephone Encounter (Signed)
Please patient know Medicare denied his prescription PA for Anusol. Let's figure out what they will cover for hemorrhoids? Thanks

## 2012-07-19 NOTE — Telephone Encounter (Signed)
Refilled nitrostat 

## 2012-07-20 MED ORDER — HYDROCORTISONE ACE-PRAMOXINE 2.5-1 % RE CREA: TOPICAL_CREAM | Freq: Three times a day (TID) | RECTAL | Status: AC

## 2012-07-20 NOTE — Telephone Encounter (Signed)
Formulary printed and given to Dustin [name]

## 2012-07-20 NOTE — Telephone Encounter (Signed)
Rx for Analpram sent in place of Anusol

## 2012-07-21 NOTE — Telephone Encounter (Signed)
Pt is aware that rx has been sent to [facility].

## 2012-07-24 ENCOUNTER — Ambulatory Visit (INDEPENDENT_AMBULATORY_CARE_PROVIDER_SITE_OTHER): Payer: Medicare Other | Admitting: Cardiology

## 2012-07-24 ENCOUNTER — Encounter: Payer: Self-pay | Admitting: Cardiology

## 2012-07-24 VITALS — BP 132/65 | HR 50 | Ht 70.0 in | Wt 193.8 lb

## 2012-07-24 DIAGNOSIS — I25810 Atherosclerosis of coronary artery bypass graft(s) without angina pectoris: Secondary | ICD-10-CM

## 2012-07-24 DIAGNOSIS — E785 Hyperlipidemia, unspecified: Secondary | ICD-10-CM

## 2012-07-24 DIAGNOSIS — F0280 Dementia in other diseases classified elsewhere without behavioral disturbance: Secondary | ICD-10-CM

## 2012-07-24 DIAGNOSIS — G309 Alzheimer's disease, unspecified: Secondary | ICD-10-CM

## 2012-07-24 NOTE — Assessment & Plan Note (Signed)
His wife seem to imply that he was doing somewhat worse. His memory seems reasonably good, and he is a fairly good command of the discussion points we have them. He'll continue on his current medications

## 2012-07-24 NOTE — Assessment & Plan Note (Signed)
Patient's medication list says that he is on pravastatin. He's had problems with statins in the past, particularly of late discomfort. We will need to contact him, and have a further discussion about this

## 2012-07-24 NOTE — Progress Notes (Signed)
HPI:  Patient returns in followup. General standpoint he's been stable. He was in the emergency room. He said he had a scratchy feeling in the chest. And describing it, it did not sound ischemic. However he was worried about it and his wife took him to the emergency room. His main limitation is that of his legs. He has a neuropathy which is clearly creating difficulty with walking.  His enzymes were negative and he was released.  Current Outpatient Prescriptions  Medication Sig Dispense Refill  . ALPRAZolam (XANAX) 1 MG tablet Take 1 mg by mouth daily as needed. For anxiety       . amLODipine (NORVASC) 10 MG tablet Take 1 tablet (10 mg total) by mouth daily.  30 tablet  6  . carvedilol (COREG) 3.125 MG tablet Take 3.125 mg by mouth every morning.       . clopidogrel (PLAVIX) 75 MG tablet Take 75 mg by mouth daily.        [name] donepezil (ARICEPT) 10 MG tablet Take 1 tablet by mouth At bedtime.      . fentaNYL (DURAGESIC - DOSED MCG/HR) 100 MCG/HR Place 1 patch onto the skin every 3 (three) days.       . hydrochlorothiazide (,MICROZIDE/HYDRODIURIL,) 12.5 MG capsule Take 10 mg by mouth daily.       . hydrocortisone-pramoxine (ANALPRAM-HC) 2.5-1 % rectal cream Place rectally 3 (three) times daily.  30 g  0  . levothyroxine (SYNTHROID, LEVOTHROID) 25 MCG tablet Take 25 mcg by mouth daily.       [name] NAMENDA 5 MG tablet Take 5 mg by mouth daily.       . nitroGLYCERIN (NITROSTAT) 0.4 MG SL tablet Place 1 tablet (0.4 mg total) under the tongue every 5 (five) minutes as needed. For chest pains  25 tablet  11  . oxymorphone (OPANA) 10 MG tablet Take 10 mg by mouth every 6 (six) hours as needed.       . pravastatin (PRAVACHOL) 40 MG tablet Take 40 mg by mouth daily.         No Known Allergies  Past Medical History  Diagnosis Date  . Arthritis   . CAD (coronary artery disease)   . Hyperlipidemia   . HTN (hypertension)   . MI (myocardial infarction)   . Hematuria   . OSA (obstructive sleep apnea)   .  Dementia   . Anxiety   . OA (osteoarthritis)   . DDD (degenerative disc disease)     chronic back pain  . Alzheimer disease   . Enlarged prostate   . Thyroid disease   . Anginal pain   . Constipation     Past Surgical History  Procedure Date  . Coronary artery bypass graft     1996 x5:   LIMA to LAD, SVG D2, SVG to PDA, SVG to OM1,2  . Hernia repair   . Coronary angioplasty with stent placement   . Colonoscopy 08/03/2004    Jenkins-numerous large diverticula in the descending, transverse, descending, and sigmoid colon. Otherwise normal exam.  . Colonoscopy 07/12/2012    Procedure: COLONOSCOPY;  Surgeon: Corbin Ade, MD;  Location: [facility];  Service: Endoscopy;  Laterality: N/A;  8:30    Family History  Problem Relation Age of Onset  . Heart disease    . Colon cancer Neg Hx     History   Social History  . Marital Status: Married    Spouse Name: N/A    Number  of Children: N/A  . Years of Education: N/A   Occupational History  . retired    Social History Main Topics  . Smoking status: Former Smoker    Types: Cigarettes    Quit date: 12/27/1994  . Smokeless tobacco: Never Used  . Alcohol Use: No  . Drug Use: No  . Sexually Active: Not on file   Other Topics Concern  . Not on file   Social History Narrative  . No narrative on file    ROS: Please see the HPI.  All other systems reviewed and negative.  PHYSICAL EXAM:  BP 132/65  Pulse 50  Ht 5\' 10"  (1.778 m)  Wt 193 lb 12.8 oz (87.907 kg)  BMI 27.81 kg/m2  General: Well developed, well nourished, in no acute distress. Head:  Normocephalic and atraumatic. Neck: no JVD Lungs: Clear to auscultation and percussion. Heart: Normal S1 and S2.  No murmur, rubs or gallops.  Abdomen:  Normal bowel sounds; soft; non tender; no organomegaly Pulses: Pulses normal in all 4 extremities.  LE pulses clearly intact.   Extremities: No clubbing or cyanosis. No edema. Neurologic: Alert and oriented x 3.  EKG:   NSR.  ILBBB. ASSESSMENT AND PLAN:

## 2012-07-24 NOTE — Patient Instructions (Signed)
Your physician recommends that you schedule a follow-up appointment in: 2 MONTHS  Your physician recommends that you continue on your current medications as directed. Please refer to the Current Medication list given to you today.   

## 2012-07-24 NOTE — Assessment & Plan Note (Signed)
T otherwise he will followup with [place] in 2 months. his is a prior intervention of the saphenous vein graft to the right coronary. He's not having unstable symptoms at the present time. We talked about a repeat cardiac catheterization, but is not having any unstable symptoms, and it's hard to tell it is different from his baseline at the present time. We'll continue to follow him and have encouraged him to come to the emergency room should he have any change in his overall symptoms.

## 2012-07-31 ENCOUNTER — Other Ambulatory Visit: Payer: Medicare Other

## 2012-09-26 ENCOUNTER — Encounter: Payer: Self-pay | Admitting: Cardiology

## 2012-09-26 ENCOUNTER — Ambulatory Visit (INDEPENDENT_AMBULATORY_CARE_PROVIDER_SITE_OTHER): Payer: Medicare Other | Admitting: Cardiology

## 2012-09-26 VITALS — BP 130/60 | HR 53 | Resp 18 | Ht 70.0 in | Wt 193.0 lb

## 2012-09-26 DIAGNOSIS — I25810 Atherosclerosis of coronary artery bypass graft(s) without angina pectoris: Secondary | ICD-10-CM

## 2012-09-26 DIAGNOSIS — E785 Hyperlipidemia, unspecified: Secondary | ICD-10-CM

## 2012-09-26 DIAGNOSIS — I10 Essential (primary) hypertension: Secondary | ICD-10-CM

## 2012-09-26 NOTE — Assessment & Plan Note (Signed)
Patient remains on diuretics. I've encouraged him to have his labs checked at Dr. Sharyon Medicus office.

## 2012-09-26 NOTE — Patient Instructions (Addendum)
Your physician recommends that you schedule a follow-up appointment in: 3 MONTHS  Your physician recommends that you continue on your current medications as directed. Please refer to the Current Medication list given to you today.  Please contact Dr Sharyon Medicus office for routine lab work.

## 2012-09-26 NOTE — Assessment & Plan Note (Signed)
Lipid profile recommended

## 2012-09-26 NOTE — Assessment & Plan Note (Signed)
The patient remained stable on medical regimen. We previously discussed repeat cardiac catheterization, but his symptoms seem better. Given the overall nature of his symptoms at the present time, continued medical therapy seems to be warranted. Should he have any change in his symptoms he should contact us promptly.

## 2012-09-26 NOTE — Progress Notes (Signed)
HPI:  Patient seen in followup. Overall he is doing somewhat better. He's only had a rare episode of chest pain, and this was resolved with nitroglycerin. Patient takes a Duragesic patch, he is been using this for pain for some time. His wife expressed some concern about him driving, I told him under no circumstances should he be driving in using this at the same time. He's had no increase in frequency of chest pain overall, clinical standpoint he is actually some better  Current Outpatient Prescriptions  Medication Sig Dispense Refill  . ALPRAZolam (XANAX) 1 MG tablet Take 1 mg by mouth daily as needed. For anxiety       . amLODipine (NORVASC) 10 MG tablet Take 1 tablet (10 mg total) by mouth daily.  30 tablet  6  . carvedilol (COREG) 3.125 MG tablet Take 3.125 mg by mouth every morning.       . clopidogrel (PLAVIX) 75 MG tablet Take 75 mg by mouth daily.        [name] donepezil (ARICEPT) 10 MG tablet Take 1 tablet by mouth At bedtime.      . fentaNYL (DURAGESIC - DOSED MCG/HR) 100 MCG/HR Place 1 patch onto the skin every 3 (three) days.       [name] gabapentin (NEURONTIN) 100 MG capsule       . hydrochlorothiazide (,MICROZIDE/HYDRODIURIL,) 12.5 MG capsule Take 10 mg by mouth daily.       [name] levothyroxine (SYNTHROID, LEVOTHROID) 25 MCG tablet Take 25 mcg by mouth daily.       [name] NAMENDA 5 MG tablet Take 5 mg by mouth daily.       . nitroGLYCERIN (NITROSTAT) 0.4 MG SL tablet Place 1 tablet (0.4 mg total) under the tongue every 5 (five) minutes as needed. For chest pains  25 tablet  11  . oxymorphone (OPANA) 10 MG tablet Take 10 mg by mouth every 6 (six) hours as needed.       . pravastatin (PRAVACHOL) 40 MG tablet Take 40 mg by mouth daily.         No Known Allergies  Past Medical History  Diagnosis Date  . Arthritis   . CAD (coronary artery disease)   . Hyperlipidemia   . HTN (hypertension)   . MI (myocardial infarction)   . Hematuria   . OSA (obstructive sleep apnea)   . Dementia   . Anxiety    . OA (osteoarthritis)   . DDD (degenerative disc disease)     chronic back pain  . Alzheimer disease   . Enlarged prostate   . Thyroid disease   . Anginal pain   . Constipation     Past Surgical History  Procedure Date  . Coronary artery bypass graft     1996 x5:   LIMA to LAD, SVG D2, SVG to PDA, SVG to OM1,2  . Hernia repair   . Coronary angioplasty with stent placement   . Colonoscopy 08/03/2004    Jenkins-numerous large diverticula in the descending, transverse, descending, and sigmoid colon. Otherwise normal exam.  . Colonoscopy 07/12/2012    Procedure: COLONOSCOPY;  Surgeon: Corbin Ade, MD;  Location: [facility];  Service: Endoscopy;  Laterality: N/A;  8:30    Family History  Problem Relation Age of Onset  . Heart disease    . Colon cancer Neg Hx     History   Social History  . Marital Status: Married    Spouse Name: N/A    Number of  Children: N/A  . Years of Education: N/A   Occupational History  . retired    Social History Main Topics  . Smoking status: Former Smoker    Types: Cigarettes    Quit date: 12/27/1994  . Smokeless tobacco: Never Used  . Alcohol Use: No  . Drug Use: No  . Sexually Active: Not on file   Other Topics Concern  . Not on file   Social History Narrative  . No narrative on file    ROS: Please see the HPI.  All other systems reviewed and negative.  PHYSICAL EXAM:  BP 130/60  Pulse 53  Resp 18  Ht 5\' 10"  (1.778 m)  Wt 193 lb (87.544 kg)  BMI 27.69 kg/m2  SpO2 98%  General: Well developed, well nourished, in no acute distress. Head:  Normocephalic and atraumatic. Neck: no JVD Lungs: Clear to auscultation and percussion. Heart: Normal S1 and S2.  No murmur, rubs or gallops.  Pulses: Pulses normal in all 4 extremities. Extremities: No clubbing or cyanosis. No edema. Neurologic: Alert and oriented x 3.  EKG:  SB.  Nonspecific IVCD.  T wave inversion V4-V6.  Last tracing had biphasic T waves laterally.    Cath  8/12  CONCLUSION:  1. Mild reduction of left ventricular function with an inferobasal  wall motion abnormality.  2. Continued patency internal mammary to the LAD.  3. Continued patency of saphenous vein graft to the diagonal.  4. Continued patency of the saphenous vein graft to two branches of  the marginal with severe disease of the marginal system retrograde  as noted in the above text.  5. Progression of disease in the saphenous vein graft to the posterior  descending branch with successful percutaneous stenting.  6. High-grade stenosis in the limb between the PDA and PLA system,  which has been previously noted. Note that there are some  collaterals noted also into the PLA system.  DISPOSITION: At the present in time, the patient will be treated  medically. He will remain on dual antiplatelet therapy. Our hope is  that this will improve his symptoms.  [name]. [name], MD, [facility]   ASSESSMENT AND PLAN:

## 2012-09-28 ENCOUNTER — Other Ambulatory Visit: Payer: Self-pay | Admitting: *Deleted

## 2012-09-28 MED ORDER — PRAVASTATIN SODIUM 40 MG PO TABS: 40.0000 mg | ORAL_TABLET | Freq: Every day | ORAL | Status: DC

## 2012-11-06 ENCOUNTER — Ambulatory Visit: Payer: Medicare Other | Admitting: Gastroenterology

## 2012-11-07 ENCOUNTER — Encounter: Payer: Self-pay | Admitting: Internal Medicine

## 2012-11-08 ENCOUNTER — Encounter: Payer: Self-pay | Admitting: Gastroenterology

## 2012-11-08 ENCOUNTER — Ambulatory Visit (INDEPENDENT_AMBULATORY_CARE_PROVIDER_SITE_OTHER): Payer: Medicare Other | Admitting: Gastroenterology

## 2012-11-08 VITALS — BP 132/75 | HR 60 | Temp 97.5°F | Ht 70.0 in | Wt 189.2 lb

## 2012-11-08 DIAGNOSIS — K5909 Other constipation: Secondary | ICD-10-CM

## 2012-11-08 DIAGNOSIS — R109 Unspecified abdominal pain: Secondary | ICD-10-CM | POA: Insufficient documentation

## 2012-11-08 DIAGNOSIS — K5900 Constipation, unspecified: Secondary | ICD-10-CM

## 2012-11-08 MED ORDER — LUBIPROSTONE 24 MCG PO CAPS: 24.0000 ug | ORAL_CAPSULE | Freq: Two times a day (BID) | ORAL | Status: DC

## 2012-11-08 NOTE — Assessment & Plan Note (Signed)
Patient complains of several month h/o progressive right sided abdominal pain. Not associated with meals, movement, or bowel movements. He also is concerned about rectus diastasis and possible small umbilical hernia. CT A/P with contrast. CMET. Further recommendations to follow.

## 2012-11-08 NOTE — Progress Notes (Signed)
Faxed to PCP

## 2012-11-08 NOTE — Assessment & Plan Note (Signed)
Start Amitiza BID with food for opioid-induced constipation. Samples and RX provided.

## 2012-11-08 NOTE — Progress Notes (Signed)
Primary Care Physician: Cassell Smiles., MD  Primary Gastroenterologist:  Roetta Sessions, MD   Chief Complaint  Patient presents with  . Constipation    HPI: Dustin [name] is a 78 y.o. male here for further evaluation of ongoing constipation and abdominal pain.   He had colonoscopy on 07/12/12 by Dr. Jena Gauss which revealed external hemorrhoidal tag, multiple tubular adenomas removed, colonic diverticulosis.   He was last seen in the office on July 2013. At that point he was complaining of long-standing history of constipation. On chronic Opana and fentanyl patch. Previously tried and failed multiple laxatives including magnesium citrate, MiraLax, enemas. Calcium and TSH were normal. He has a history of hypothyroidism. Feels like body does not know how to go any more. Rarely gets the urge to have a bowel movement. He also complains of right-sided abdominal pain which he did not mention previously. He has some pain every day for several months. Not postprandial. Not positional. Not relieved with bowel movement. Feels like constipation worse since colonoscopy in 06/2012. No urinary issues.     Current Outpatient Prescriptions  Medication Sig Dispense Refill  . ALPRAZolam (XANAX) 1 MG tablet Take 1 mg by mouth daily as needed. For anxiety       . amLODipine (NORVASC) 10 MG tablet Take 1 tablet (10 mg total) by mouth daily.  30 tablet  6  . carvedilol (COREG) 3.125 MG tablet Take 3.125 mg by mouth every morning.       . clopidogrel (PLAVIX) 75 MG tablet Take 75 mg by mouth daily.        [name] donepezil (ARICEPT) 10 MG tablet Take 1 tablet by mouth At bedtime.      . fentaNYL (DURAGESIC - DOSED MCG/HR) 100 MCG/HR Place 1 patch onto the skin every 3 (three) days.       [name] gabapentin (NEURONTIN) 100 MG capsule Take 100 mg by mouth 3 (three) times daily.       . hydrochlorothiazide (,MICROZIDE/HYDRODIURIL,) 12.5 MG capsule Take 12.5 mg by mouth daily.       [name] levothyroxine (SYNTHROID, LEVOTHROID) 25 MCG  tablet Take 25 mcg by mouth daily.       [name] NAMENDA 5 MG tablet Take 5 mg by mouth daily.       . nitroGLYCERIN (NITROSTAT) 0.4 MG SL tablet Place 1 tablet (0.4 mg total) under the tongue every 5 (five) minutes as needed. For chest pains  25 tablet  11  . oxymorphone (OPANA) 10 MG tablet Take 10 mg by mouth every 6 (six) hours as needed.       . pravastatin (PRAVACHOL) 40 MG tablet Take 1 tablet (40 mg total) by mouth daily.  30 tablet  6    Allergies as of 11/08/2012  . (No Known Allergies)    ROS:  General: Negative for anorexia, weight loss, fever, chills, fatigue, weakness. ENT: Negative for hoarseness, difficulty swallowing , nasal congestion. CV: Negative for chest pain, angina, palpitations, dyspnea on exertion, peripheral edema.  Respiratory: Negative for dyspnea at rest, dyspnea on exertion, cough, sputum, wheezing.  GI: See history of present illness. GU:  Negative for dysuria, hematuria, urinary incontinence, urinary frequency, nocturnal urination.  Endo: Negative for unusual weight change.    Physical Examination:   BP 132/75  Pulse 60  Temp 97.5 F (36.4 C) (Temporal)  Ht 5\' 10"  (1.778 m)  Wt 189 lb 3.2 oz (85.821 kg)  BMI 27.15 kg/m2  General: Well-nourished, well-developed in no acute distress.  Eyes:  No icterus. Mouth: Oropharyngeal mucosa moist and pink , no lesions erythema or exudate. Lungs: Clear to auscultation bilaterally.  Heart: Regular rate and rhythm, no murmurs rubs or gallops.  Abdomen: Bowel sounds are normal, diffuse right sided abdominal pain but more in ruq to deep palpation. Rectus diastasis and ?umb hernia. nondistended, no hepatosplenomegaly or masses, no abdominal bruits, no rebound or guarding.   Extremities: No lower extremity edema. No clubbing or deformities. Neuro: Alert and oriented x 4   Skin: Warm and dry, no jaundice.   Psych: Alert and cooperative, normal mood and affect.

## 2012-11-08 NOTE — Patient Instructions (Addendum)
We have scheduled you for a CT scan of your abdomen. You'll need to get blood work done at least 2 days prior to your scan. Start Amitiza twice daily before a meal for constipation. Samples and prescription have been provided to you today.

## 2012-11-09 LAB — COMPREHENSIVE METABOLIC PANEL
ALT: 19 U/L (ref 0–53)
Albumin: 4.6 g/dL (ref 3.5–5.2)
CO2: 30 mEq/L (ref 19–32)
Calcium: 10 mg/dL (ref 8.4–10.5)
Chloride: 99 mEq/L (ref 96–112)
Creat: 0.81 mg/dL (ref 0.50–1.35)
Potassium: 4.3 mEq/L (ref 3.5–5.3)
Sodium: 140 mEq/L (ref 135–145)
Total Protein: 6.8 g/dL (ref 6.0–8.3)

## 2012-11-10 ENCOUNTER — Ambulatory Visit (HOSPITAL_COMMUNITY)
Admission: RE | Admit: 2012-11-10 | Discharge: 2012-11-10 | Disposition: A | Discharge status: Home / Self Care | Payer: Medicare Other | Source: Ambulatory Visit | Attending: Gastroenterology | Admitting: Gastroenterology

## 2012-11-10 DIAGNOSIS — F0280 Dementia in other diseases classified elsewhere without behavioral disturbance: Secondary | ICD-10-CM | POA: Insufficient documentation

## 2012-11-10 DIAGNOSIS — N289 Disorder of kidney and ureter, unspecified: Secondary | ICD-10-CM | POA: Insufficient documentation

## 2012-11-10 DIAGNOSIS — R109 Unspecified abdominal pain: Secondary | ICD-10-CM

## 2012-11-10 DIAGNOSIS — G309 Alzheimer's disease, unspecified: Secondary | ICD-10-CM | POA: Insufficient documentation

## 2012-11-10 DIAGNOSIS — I10 Essential (primary) hypertension: Secondary | ICD-10-CM | POA: Insufficient documentation

## 2012-11-10 MED ORDER — IOHEXOL 300 MG/ML  SOLN
100.0000 mL | Freq: Once | INTRAMUSCULAR | Status: AC | PRN
  Administered 2012-11-10: 100 mL via INTRAVENOUS

## 2012-11-10 NOTE — Progress Notes (Signed)
Faxed to PCP

## 2012-11-10 NOTE — Progress Notes (Signed)
Quick Note:  His glucose is up. Known h/o diabetes or medications for such in EPIC. He needs to see his PCP next week. Send copy of labs. CT as planned. ______

## 2012-11-13 ENCOUNTER — Telehealth: Payer: Self-pay | Admitting: Internal Medicine

## 2012-11-13 NOTE — Telephone Encounter (Signed)
Pt came to window say that he has lost his samples of Amitiza 24 BID and that [facility] has not received a prescription on this for him to have filled. Can we give him more samples and check on this prescription for him? He is in waiting room.

## 2012-11-13 NOTE — Telephone Encounter (Signed)
I called [facility] and spoke to Barry. He said they did not receive the E script. I gave a verbal order for it. Also, gave pt #12 samples, so he would have a few if a PA was required.  Pt also would like to hear from his CT results.

## 2012-11-13 NOTE — Telephone Encounter (Signed)
Routing to [facility] for ct results.

## 2012-11-14 NOTE — Progress Notes (Signed)
Quick Note:  Please make sure patient f/u with PCP regarding elevated glucose and possible NEW diagnosis of DM. Previously there was a typo that he had known DM rather than no known DM.  His CT showed: His has moderate amount of stool throughout his colon c/w constipation. No other findings to explain abdominal pain. Continue Amitiza bid. His is stool softner and more frequent??? OV with RMR only. ______

## 2012-11-15 NOTE — Progress Notes (Signed)
appt made for 01-05-13 w/RMR, results faxed to PCP

## 2013-01-02 ENCOUNTER — Ambulatory Visit (INDEPENDENT_AMBULATORY_CARE_PROVIDER_SITE_OTHER): Payer: Medicare Other | Admitting: Cardiology

## 2013-01-02 ENCOUNTER — Encounter: Payer: Self-pay | Admitting: Cardiology

## 2013-01-02 VITALS — BP 119/67 | HR 57 | Ht 70.0 in | Wt 197.0 lb

## 2013-01-02 DIAGNOSIS — R42 Dizziness and giddiness: Secondary | ICD-10-CM

## 2013-01-02 DIAGNOSIS — I25810 Atherosclerosis of coronary artery bypass graft(s) without angina pectoris: Secondary | ICD-10-CM

## 2013-01-02 NOTE — Assessment & Plan Note (Signed)
Some of his symptoms could be ischemic.  He has remote CABG with interim stenting, so is at risk.  Continue medical therapy, and he will call if progression.  We will see him in March.

## 2013-01-02 NOTE — Assessment & Plan Note (Signed)
No orthostasis on exam.

## 2013-01-02 NOTE — Patient Instructions (Addendum)
Your physician recommends that you schedule a follow-up appointment in: March 2014 with Dr. Stuckey.  

## 2013-01-02 NOTE — Progress Notes (Signed)
HPI:  Patient returns in followup. He uses nitroglycerin about 3-4 times a week. Some of the episodes of discomfort seemed to occur after he eats. He's not sure whether cardiac or indigestion related. Nonetheless, he still wants to manage this all conservatively. He also has been somewhat dizzy, although his orthostatic blood pressures today demonstrate no real drop. He continues to have problems with neuropathy, and had neurovascular studies done at [facility] neurologic. He remains on the current medical regimen, there's been no real change  Current Outpatient Prescriptions  Medication Sig Dispense Refill  . ALPRAZolam (XANAX) 1 MG tablet Take 1 mg by mouth daily as needed. For anxiety       . amLODipine (NORVASC) 10 MG tablet Take 1 tablet (10 mg total) by mouth daily.  30 tablet  6  . carvedilol (COREG) 3.125 MG tablet Take 3.125 mg by mouth every morning.       . clopidogrel (PLAVIX) 75 MG tablet Take 75 mg by mouth daily.        [name] donepezil (ARICEPT) 10 MG tablet Take 1 tablet by mouth At bedtime.      . fentaNYL (DURAGESIC - DOSED MCG/HR) 100 MCG/HR Place 1 patch onto the skin every 3 (three) days.       [name] gabapentin (NEURONTIN) 100 MG capsule Take 100 mg by mouth 3 (three) times daily.       . hydrochlorothiazide (,MICROZIDE/HYDRODIURIL,) 12.5 MG capsule Take 12.5 mg by mouth daily.       [name] levothyroxine (SYNTHROID, LEVOTHROID) 25 MCG tablet Take 25 mcg by mouth daily.       [name] lubiprostone (AMITIZA) 24 MCG capsule Take 1 capsule (24 mcg total) by mouth 2 (two) times daily with a meal.  60 capsule  3  . NAMENDA 5 MG tablet Take 5 mg by mouth daily.       . nitroGLYCERIN (NITROSTAT) 0.4 MG SL tablet Place 1 tablet (0.4 mg total) under the tongue every 5 (five) minutes as needed. For chest pains  25 tablet  11  . oxymorphone (OPANA) 10 MG tablet Take 10 mg by mouth every 6 (six) hours as needed.       . pravastatin (PRAVACHOL) 40 MG tablet Take 1 tablet (40 mg total) by mouth daily.  30  tablet  6    No Known Allergies  Past Medical History  Diagnosis Date  . Arthritis   . CAD (coronary artery disease)   . Hyperlipidemia   . HTN (hypertension)   . MI (myocardial infarction)   . Hematuria   . OSA (obstructive sleep apnea)   . Dementia   . Anxiety   . OA (osteoarthritis)   . DDD (degenerative disc disease)     chronic back pain  . Alzheimer disease   . Enlarged prostate   . Thyroid disease   . Anginal pain   . Constipation     Past Surgical History  Procedure Date  . Coronary artery bypass graft     1996 x5:   LIMA to LAD, SVG D2, SVG to PDA, SVG to OM1,2  . Hernia repair   . Coronary angioplasty with stent placement   . Colonoscopy 08/03/2004    Jenkins-numerous large diverticula in the descending, transverse, descending, and sigmoid colon. Otherwise normal exam.  . Colonoscopy 07/12/2012    RMR: External hemorrhoidal tag; multiple rectal and colonic polyps removed and/or treated as described above. Pancolonic diverticulosis. Bx-tubular adenomas, rectal hyperplastic polyp. next colonoscopy in 06/2015.  Family History  Problem Relation Age of Onset  . Heart disease    . Colon cancer Neg Hx     History   Social History  . Marital Status: Married    Spouse Name: N/A    Number of Children: N/A  . Years of Education: N/A   Occupational History  . retired    Social History Main Topics  . Smoking status: Former Smoker    Types: Cigarettes    Quit date: 12/27/1994  . Smokeless tobacco: Never Used  . Alcohol Use: No  . Drug Use: No  . Sexually Active: Not on file   Other Topics Concern  . Not on file   Social History Narrative  . No narrative on file    ROS: Please see the HPI.  All other systems reviewed and negative.  PHYSICAL EXAM:  BP 119/67  Pulse 57  Ht 5\' 10"  (1.778 m)  Wt 197 lb (89.359 kg)  BMI 28.27 kg/m2  SpO2 99%  General: Well developed, well nourished, in no acute distress. Head:  Normocephalic and  atraumatic. Neck: no JVD Lungs: Clear to auscultation and percussion. Heart: Normal S1 and S2.  No murmur, rubs or gallops.  Abdomen:  Normal bowel sounds; soft; non tender; no organomegaly Pulses: Pulses normal in all 4 extremities. Extremities: No clubbing or cyanosis. No edema. Neurologic: Alert and oriented x 3.  EKG:  SB.  LVH.  Inferolateral T inversion, compared to July 2013.  Multiple tracings reviewed.    ASSESSMENT AND PLAN:

## 2013-01-05 ENCOUNTER — Ambulatory Visit: Payer: Medicare Other | Admitting: Internal Medicine

## 2013-01-22 ENCOUNTER — Emergency Department (HOSPITAL_COMMUNITY)
Admission: EM | Admit: 2013-01-22 | Discharge: 2013-01-23 | Disposition: A | Discharge status: Home / Self Care | Payer: Medicare Other | Attending: Emergency Medicine | Admitting: Emergency Medicine

## 2013-01-22 ENCOUNTER — Emergency Department (HOSPITAL_COMMUNITY): Payer: Medicare Other

## 2013-01-22 ENCOUNTER — Encounter (HOSPITAL_COMMUNITY): Payer: Self-pay | Admitting: *Deleted

## 2013-01-22 DIAGNOSIS — Z87448 Personal history of other diseases of urinary system: Secondary | ICD-10-CM | POA: Insufficient documentation

## 2013-01-22 DIAGNOSIS — Z79899 Other long term (current) drug therapy: Secondary | ICD-10-CM | POA: Insufficient documentation

## 2013-01-22 DIAGNOSIS — Y9389 Activity, other specified: Secondary | ICD-10-CM | POA: Insufficient documentation

## 2013-01-22 DIAGNOSIS — S91109A Unspecified open wound of unspecified toe(s) without damage to nail, initial encounter: Secondary | ICD-10-CM | POA: Insufficient documentation

## 2013-01-22 DIAGNOSIS — S91119A Laceration without foreign body of unspecified toe without damage to nail, initial encounter: Secondary | ICD-10-CM

## 2013-01-22 DIAGNOSIS — Z8739 Personal history of other diseases of the musculoskeletal system and connective tissue: Secondary | ICD-10-CM | POA: Insufficient documentation

## 2013-01-22 DIAGNOSIS — G309 Alzheimer's disease, unspecified: Secondary | ICD-10-CM | POA: Insufficient documentation

## 2013-01-22 DIAGNOSIS — E079 Disorder of thyroid, unspecified: Secondary | ICD-10-CM | POA: Insufficient documentation

## 2013-01-22 DIAGNOSIS — Z87891 Personal history of nicotine dependence: Secondary | ICD-10-CM | POA: Insufficient documentation

## 2013-01-22 DIAGNOSIS — G4733 Obstructive sleep apnea (adult) (pediatric): Secondary | ICD-10-CM | POA: Insufficient documentation

## 2013-01-22 DIAGNOSIS — I10 Essential (primary) hypertension: Secondary | ICD-10-CM | POA: Insufficient documentation

## 2013-01-22 DIAGNOSIS — Z8719 Personal history of other diseases of the digestive system: Secondary | ICD-10-CM | POA: Insufficient documentation

## 2013-01-22 DIAGNOSIS — Z7902 Long term (current) use of antithrombotics/antiplatelets: Secondary | ICD-10-CM | POA: Insufficient documentation

## 2013-01-22 DIAGNOSIS — Z8679 Personal history of other diseases of the circulatory system: Secondary | ICD-10-CM | POA: Insufficient documentation

## 2013-01-22 DIAGNOSIS — I252 Old myocardial infarction: Secondary | ICD-10-CM | POA: Insufficient documentation

## 2013-01-22 DIAGNOSIS — W19XXXA Unspecified fall, initial encounter: Secondary | ICD-10-CM

## 2013-01-22 DIAGNOSIS — W230XXA Caught, crushed, jammed, or pinched between moving objects, initial encounter: Secondary | ICD-10-CM | POA: Insufficient documentation

## 2013-01-22 DIAGNOSIS — E785 Hyperlipidemia, unspecified: Secondary | ICD-10-CM | POA: Insufficient documentation

## 2013-01-22 DIAGNOSIS — I2510 Atherosclerotic heart disease of native coronary artery without angina pectoris: Secondary | ICD-10-CM | POA: Insufficient documentation

## 2013-01-22 DIAGNOSIS — Y929 Unspecified place or not applicable: Secondary | ICD-10-CM | POA: Insufficient documentation

## 2013-01-22 DIAGNOSIS — F411 Generalized anxiety disorder: Secondary | ICD-10-CM | POA: Insufficient documentation

## 2013-01-22 DIAGNOSIS — F0280 Dementia in other diseases classified elsewhere without behavioral disturbance: Secondary | ICD-10-CM | POA: Insufficient documentation

## 2013-01-22 LAB — CBC WITH DIFFERENTIAL/PLATELET
Basophils Absolute: 0.1 10*3/uL (ref 0.0–0.1)
Basophils Relative: 1 % (ref 0–1)
Lymphocytes Relative: 27 % (ref 12–46)
Neutro Abs: 3.8 10*3/uL (ref 1.7–7.7)
Platelets: 218 10*3/uL (ref 150–400)
RDW: 12.7 % (ref 11.5–15.5)
WBC: 6.5 10*3/uL (ref 4.0–10.5)
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
## 2013-01-22 NOTE — ED Provider Notes (Signed)
History  This chart was scribed for [employer]. Colon Branch, MD by Erskine Emery, ED Scribe. This patient was seen in room APA16A/APA16A and the patient's care was started at 23:02.   CSN: [id number]  Arrival date & time 01/22/13  2132   First MD Initiated Contact with Patient 01/22/13 2302      Chief Complaint  Patient presents with  . Fall    (Consider location/radiation/quality/duration/timing/severity/associated sxs/prior Treatment) The history is provided by the patient and the spouse. No language interpreter was used.  DSHAWN [name] is a 79 y.o. male with a h/o CAD and MI who presents to the Emergency Department complaining of chest pain, mild difficulty breathing, and left great toe laceration since he fell out of his car and got his shoe caught in the door just PTA. Pt reports he hit his head, face, and chest upon falling. He does not think he lost consciousness. His spouse reports his toe was bleeding profusely but the bleeding is now controlled. Pt's spouse reports he falls a lot. Pt is on plavix.   Past Medical History  Diagnosis Date  . Arthritis   . CAD (coronary artery disease)   . Hyperlipidemia   . HTN (hypertension)   . MI (myocardial infarction)   . Hematuria   . OSA (obstructive sleep apnea)   . Dementia   . Anxiety   . OA (osteoarthritis)   . DDD (degenerative disc disease)     chronic back pain  . Alzheimer disease   . Enlarged prostate   . Thyroid disease   . Anginal pain   . Constipation     Past Surgical History  Procedure Date  . Coronary artery bypass graft     1996 x5:   LIMA to LAD, SVG D2, SVG to PDA, SVG to OM1,2  . Hernia repair   . Coronary angioplasty with stent placement   . Colonoscopy 08/03/2004    Jenkins-numerous large diverticula in the descending, transverse, descending, and sigmoid colon. Otherwise normal exam.  . Colonoscopy 07/12/2012    RMR: External hemorrhoidal tag; multiple rectal and colonic polyps removed and/or treated as  described above. Pancolonic diverticulosis. Bx-tubular adenomas, rectal hyperplastic polyp. next colonoscopy in 06/2015.    Family History  Problem Relation Age of Onset  . Heart disease    . Colon cancer Neg Hx     History  Substance Use Topics  . Smoking status: Former Smoker    Types: Cigarettes    Quit date: 12/27/1994  . Smokeless tobacco: Never Used  . Alcohol Use: No      Review of Systems  A complete 10 system review of systems was obtained and all systems are negative except as noted in the HPI and PMH.    Allergies  Review of patient's allergies indicates no known allergies.  Home Medications   Current Outpatient Rx  Name  Route  Sig  Dispense  Refill  . ALPRAZOLAM 1 MG PO TABS   Oral   Take 1 mg by mouth daily as needed. For anxiety          . AMLODIPINE BESYLATE 10 MG PO TABS   Oral   Take 1 tablet (10 mg total) by mouth daily.   30 tablet   6   . CARVEDILOL 3.125 MG PO TABS   Oral   Take 3.125 mg by mouth every morning.          [name] CLOPIDOGREL BISULFATE 75 MG PO TABS   Oral  Take 75 mg by mouth daily.           . DONEPEZIL HCL 10 MG PO TABS   Oral   Take 1 tablet by mouth At bedtime.         [name] ESCITALOPRAM OXALATE 10 MG PO TABS   Oral   Take 10 mg by mouth daily.         [name] GABAPENTIN 100 MG PO CAPS   Oral   Take 100 mg by mouth daily.          [name] HYDROCHLOROTHIAZIDE 12.5 MG PO CAPS   Oral   Take 12.5 mg by mouth daily.          [name] LEVOTHYROXINE SODIUM 25 MCG PO TABS   Oral   Take 25 mcg by mouth daily.          . LUBIPROSTONE 24 MCG PO CAPS   Oral   Take 1 capsule (24 mcg total) by mouth 2 (two) times daily with a meal.   60 capsule   3   . NAMENDA 5 MG PO TABS   Oral   Take 5 mg by mouth daily.          [name] NITROGLYCERIN 0.4 MG SL SUBL   Sublingual   Place 1 tablet (0.4 mg total) under the tongue every 5 (five) minutes as needed. For chest pains   25 tablet   11   . OXYMORPHONE HCL 10 MG PO TABS   Oral    Take 10 mg by mouth every 6 (six) hours as needed.          [name] PRAVASTATIN SODIUM 40 MG PO TABS   Oral   Take 1 tablet (40 mg total) by mouth daily.   30 tablet   6   . FENTANYL 100 MCG/HR TD PT72   Transdermal   Place 1 patch onto the skin every 3 (three) days.            Triage Vitals: BP 138/57  Pulse 55  Temp 97.3 F (36.3 C) (Oral)  Resp 20  Ht 5\' 10"  (1.778 m)  Wt 185 lb (83.915 kg)  BMI 26.54 kg/m2  SpO2 96%  Physical Exam  Nursing note and vitals reviewed. Constitutional: He is oriented to person, place, and time. He appears well-developed and well-nourished. No distress.  HENT:  Head: Normocephalic and atraumatic.  Eyes: EOM are normal. Pupils are equal, round, and reactive to light.  Neck: Neck supple. No tracheal deviation present.  Cardiovascular: Normal rate.   Pulmonary/Chest: Effort normal. No respiratory distress.  Abdominal: Soft. He exhibits no distension.  Musculoskeletal: Normal range of motion. He exhibits no edema.  Neurological: He is alert and oriented to person, place, and time.  Skin: Skin is warm and dry.       No bruising. No lesions. One laceration to the left great toe.  Psychiatric: He has a normal mood and affect.    ED Course  Procedures (including critical care time) DIAGNOSTIC STUDIES: Results for orders placed during the hospital encounter of 01/22/13  CBC WITH DIFFERENTIAL      Component Value Range   WBC 6.5  4.0 - 10.5 K/uL   RBC 4.36  4.22 - 5.81 MIL/uL   Hemoglobin 14.0  13.0 - 17.0 g/dL   HCT 16.1  09.6 - 04.5 %   MCV 93.6  78.0 - 100.0 fL   MCH 32.1  26.0 - 34.0 pg   MCHC 34.3  30.0 -  36.0 g/dL   RDW 02.7  25.3 - 66.4 %   Platelets 218  150 - 400 K/uL   Neutrophils Relative 59  43 - 77 %   Neutro Abs 3.8  1.7 - 7.7 K/uL   Lymphocytes Relative 27  12 - 46 %   Lymphs Abs 1.8  0.7 - 4.0 K/uL   Monocytes Relative 9  3 - 12 %   Monocytes Absolute 0.6  0.1 - 1.0 K/uL   Eosinophils Relative 4  0 - 5 %   Eosinophils  Absolute 0.3  0.0 - 0.7 K/uL   Basophils Relative 1  0 - 1 %   Basophils Absolute 0.1  0.0 - 0.1 K/uL    Oxygen Saturation is 96% on room air, adequate by my interpretation.    COORDINATION OF CARE: 23:10--I evaluated the patient and we discussed a treatment plan including blood count check, x-ray, and laceration repair (glue) to which the pt agreed.   Dg Abd Acute W/chest  01/22/2013  *RADIOLOGY REPORT*  Clinical Data: Chest pain and right side pain after fall  ACUTE ABDOMEN SERIES (ABDOMEN 2 VIEW & CHEST 1 VIEW)  Comparison: 07/07/2012 and  Findings: Mild cardiomegaly stable.  The patient status post CABG. Vascular pattern normal.  Lungs clear.  No free air.  No abnormally dilated loops of bowel.  Mild to moderate fecal retention.  Gas is seen into the distal colon.  IMPRESSION: Nonobstructive gas pattern.  No acute findings.   Original Report Authenticated By: Esperanza Heir, M.D.    LACERATION REPAIR Performed by: Annamarie Dawley. Authorized by: Annamarie Dawley Consent: Verbal consent obtained. Risks and benefits: risks, benefits and alternatives were discussed Consent given by: patient Patient identity confirmed: provided demographic data Prepped and Draped in normal sterile fashion Wound explored Laceration Location: left foot Laceration Length: 2 cm No Foreign Bodies seen or palpated Irrigation method: syringe Amount of cleaning: standard Skin closure: dermabond Patient tolerance: Patient tolerated the procedure well with no immediate complications.   MDM  Patient with laceration to foot. Dermabond placed. Xray was negative. Labs were negative. Reviewed results with patient. Pt stable in ED with no significant deterioration in condition.The patient appears reasonably screened and/or stabilized for discharge and I doubt any other medical condition or other [facility] requiring further screening, evaluation, or treatment in the ED at this time prior to discharge.  I personally  performed the services described in this documentation, which was scribed in my presence. The recorded information has been reviewed and considered.  MDM Reviewed: nursing note and vitals Interpretation: labs and x-ray            Nicoletta Dress. Colon Branch, MD 01/23/13 0111

## 2013-01-23 NOTE — ED Notes (Signed)
Patient discharged by Dr. Colon Branch.

## 2013-03-05 ENCOUNTER — Emergency Department (HOSPITAL_COMMUNITY)
Admission: EM | Admit: 2013-03-05 | Discharge: 2013-03-06 | Disposition: A | Discharge status: Home / Self Care | Payer: Medicare Other | Attending: Emergency Medicine | Admitting: Emergency Medicine

## 2013-03-05 ENCOUNTER — Encounter (HOSPITAL_COMMUNITY): Payer: Self-pay | Admitting: *Deleted

## 2013-03-05 ENCOUNTER — Ambulatory Visit (INDEPENDENT_AMBULATORY_CARE_PROVIDER_SITE_OTHER): Payer: Medicare Other | Admitting: Urgent Care

## 2013-03-05 ENCOUNTER — Encounter: Payer: Self-pay | Admitting: Urgent Care

## 2013-03-05 ENCOUNTER — Other Ambulatory Visit: Payer: Self-pay

## 2013-03-05 VITALS — BP 126/69 | HR 52 | Temp 98.3°F | Ht 69.0 in | Wt 175.6 lb

## 2013-03-05 DIAGNOSIS — F0280 Dementia in other diseases classified elsewhere without behavioral disturbance: Secondary | ICD-10-CM | POA: Insufficient documentation

## 2013-03-05 DIAGNOSIS — Y9229 Other specified public building as the place of occurrence of the external cause: Secondary | ICD-10-CM | POA: Insufficient documentation

## 2013-03-05 DIAGNOSIS — Z8739 Personal history of other diseases of the musculoskeletal system and connective tissue: Secondary | ICD-10-CM | POA: Insufficient documentation

## 2013-03-05 DIAGNOSIS — Z8669 Personal history of other diseases of the nervous system and sense organs: Secondary | ICD-10-CM | POA: Insufficient documentation

## 2013-03-05 DIAGNOSIS — Z87891 Personal history of nicotine dependence: Secondary | ICD-10-CM | POA: Insufficient documentation

## 2013-03-05 DIAGNOSIS — E079 Disorder of thyroid, unspecified: Secondary | ICD-10-CM | POA: Insufficient documentation

## 2013-03-05 DIAGNOSIS — E785 Hyperlipidemia, unspecified: Secondary | ICD-10-CM | POA: Insufficient documentation

## 2013-03-05 DIAGNOSIS — I10 Essential (primary) hypertension: Secondary | ICD-10-CM | POA: Insufficient documentation

## 2013-03-05 DIAGNOSIS — I2510 Atherosclerotic heart disease of native coronary artery without angina pectoris: Secondary | ICD-10-CM | POA: Insufficient documentation

## 2013-03-05 DIAGNOSIS — Z9861 Coronary angioplasty status: Secondary | ICD-10-CM | POA: Insufficient documentation

## 2013-03-05 DIAGNOSIS — G309 Alzheimer's disease, unspecified: Secondary | ICD-10-CM | POA: Insufficient documentation

## 2013-03-05 DIAGNOSIS — D126 Benign neoplasm of colon, unspecified: Secondary | ICD-10-CM | POA: Insufficient documentation

## 2013-03-05 DIAGNOSIS — R1310 Dysphagia, unspecified: Secondary | ICD-10-CM | POA: Insufficient documentation

## 2013-03-05 DIAGNOSIS — R630 Anorexia: Secondary | ICD-10-CM | POA: Insufficient documentation

## 2013-03-05 DIAGNOSIS — K5903 Drug induced constipation: Secondary | ICD-10-CM | POA: Insufficient documentation

## 2013-03-05 DIAGNOSIS — K5900 Constipation, unspecified: Secondary | ICD-10-CM

## 2013-03-05 DIAGNOSIS — IMO0002 Reserved for concepts with insufficient information to code with codable children: Secondary | ICD-10-CM | POA: Insufficient documentation

## 2013-03-05 DIAGNOSIS — I252 Old myocardial infarction: Secondary | ICD-10-CM | POA: Insufficient documentation

## 2013-03-05 DIAGNOSIS — Z87448 Personal history of other diseases of urinary system: Secondary | ICD-10-CM | POA: Insufficient documentation

## 2013-03-05 DIAGNOSIS — Z8679 Personal history of other diseases of the circulatory system: Secondary | ICD-10-CM | POA: Insufficient documentation

## 2013-03-05 DIAGNOSIS — T189XXA Foreign body of alimentary tract, part unspecified, initial encounter: Secondary | ICD-10-CM | POA: Insufficient documentation

## 2013-03-05 DIAGNOSIS — Y9389 Activity, other specified: Secondary | ICD-10-CM | POA: Insufficient documentation

## 2013-03-05 DIAGNOSIS — Z79899 Other long term (current) drug therapy: Secondary | ICD-10-CM | POA: Insufficient documentation

## 2013-03-05 DIAGNOSIS — Z7902 Long term (current) use of antithrombotics/antiplatelets: Secondary | ICD-10-CM | POA: Insufficient documentation

## 2013-03-05 NOTE — Patient Instructions (Addendum)
EGD upper endoscopy with Dr. Jena Gauss We will try and get a contact for Meals on Wheels to help you and your wife with meals Use Amitiza 24 mcg once or twice daily for constipation

## 2013-03-05 NOTE — Progress Notes (Signed)
Referring Dustin [name]: Dustin Nevins, MD Primary Care Physician:  Dustin [name]., MD Primary Gastroenterologist:  Dr. Jena [name]  Chief Complaint  Patient presents with  . Follow-up    early satiety, constipation, weight loss    HPI:  Dustin [name] is a 79 y.o. male here re-referred by Dr Dustin [name] for early stiety & weight loss.  We had previously ongoing evaluation for opiod-induced constipation.  He has Alzheimer's disease & is confused about appt mix-up.  He thought he was seeing Dr Dustin [name], but agreed to see me instead.  He had actually cancelled his last visit with Dr Dustin [name].  He continued to c/o constipation.  He tells me he is "in the donut hole" and cannot afford amitiza although it worked well.  He was taking Amitiza BID PRN.  It was costing him $170.  He lives with his ailing wife.  He has a hard time preparing meals.  He feels like his dementia is worsening.  He is frustrated.  He has been taking 2-3 [employer] laxative tablets with a couple stool softeners.  He says he never feels cleaned out.  Denies dysphagia or odynophagia.  He "forces himself to eat & adds sugar to even get it down".  No appetite.  +early satiety.  Even smells make him sick.  His son in [place] brings meals 3 days per week.  He tells me most days he stays in the bed all day long feeling "sick".  Wife is too sick to get up too.  He c/o neuropathies in both legs & feet feel like walking on hot nails. Denies heartburn or indigestion, nausea or vomiting.    Denies abdominal pain.  His "taste is off", can't even drink tea/coke.  He was started on thyroid medication last year but admits to not taking his medications as ordered.  He has had a weight loss of 12# in the past year.    Labs reviewed from 01/26/13 CMP normal, CBC normal, amylase, lipase, TSH, and PSA normal.  Past Medical History  Diagnosis Date  . Arthritis   . CAD (coronary artery disease)   . Hyperlipidemia   . HTN (hypertension)   . MI (myocardial infarction)    . Hematuria   . OSA (obstructive sleep apnea)   . Dementia   . Anxiety   . OA (osteoarthritis)   . DDD (degenerative disc disease)     chronic back pain  . Alzheimer disease   . Enlarged prostate   . Thyroid disease   . Anginal pain   . Constipation     Past Surgical History  Procedure Laterality Date  . Coronary artery bypass graft      1996 x5:   LIMA to LAD, SVG D2, SVG to PDA, SVG to OM1,2  . Hernia repair    . Coronary angioplasty with stent placement    . Colonoscopy  08/03/2004    Jenkins-numerous large diverticula in the descending, transverse, descending, and sigmoid colon. Otherwise normal exam.  . Colonoscopy  07/12/2012    RMR: External hemorrhoidal tag; multiple rectal and colonic polyps removed and/or treated as described above. Pancolonic diverticulosis. Bx-tubular adenomas, rectal hyperplastic polyp. next colonoscopy in 06/2015.    Current Outpatient Prescriptions  Medication Sig Dispense Refill  . ALPRAZolam (XANAX) 1 MG tablet Take 1 mg by mouth daily as needed. For anxiety       . amLODipine (NORVASC) 10 MG tablet Take 1 tablet (10 mg total) by mouth daily.  30 tablet  6  .  carvedilol (COREG) 3.125 MG tablet Take 3.125 mg by mouth every morning.       . clopidogrel (PLAVIX) 75 MG tablet Take 75 mg by mouth daily.        [name] donepezil (ARICEPT) 10 MG tablet Take 1 tablet by mouth At bedtime.      [name] escitalopram (LEXAPRO) 10 MG tablet Take 10 mg by mouth daily.      . fentaNYL (DURAGESIC - DOSED MCG/HR) 100 MCG/HR Place 1 patch onto the skin every 3 (three) days.       [name] gabapentin (NEURONTIN) 100 MG capsule Take 100 mg by mouth daily.       . hydrochlorothiazide (,MICROZIDE/HYDRODIURIL,) 12.5 MG capsule Take 12.5 mg by mouth daily.       [name] levothyroxine (SYNTHROID, LEVOTHROID) 25 MCG tablet Take 25 mcg by mouth daily.       [name] lubiprostone (AMITIZA) 24 MCG capsule Take 1 capsule (24 mcg total) by mouth 2 (two) times daily with a meal.  60 capsule  3  . NAMENDA 5 MG  tablet Take 5 mg by mouth daily.       . nitroGLYCERIN (NITROSTAT) 0.4 MG SL tablet Place 1 tablet (0.4 mg total) under the tongue every 5 (five) minutes as needed. For chest pains  25 tablet  11  . oxymorphone (OPANA) 10 MG tablet Take 10 mg by mouth every 6 (six) hours as needed.       . pravastatin (PRAVACHOL) 40 MG tablet Take 1 tablet (40 mg total) by mouth daily.  30 tablet  6   No current facility-administered medications for this visit.    Allergies as of 03/05/2013  . (No Known Allergies)    Review of Systems: See HPI, otherwise negative  Physical Exam: BP 126/69  Pulse 52  Temp(Src) 98.3 F (36.8 C) (Oral)  Ht 5\' 9"  (1.753 m)  Wt 175 lb 9.6 oz (79.652 kg)  BMI 25.92 kg/m2 No LMP for male patient. General:   Alert,  Thin, pleasant and cooperative in NAD Head:  Normocephalic and atraumatic. Eyes:  Sclera clear, no icterus.   Conjunctiva pink. Ears:  Normal auditory acuity. Nose:  No deformity, discharge, or lesions. Mouth:  No deformity or lesions Neck:  Supple; no masses or thyromegaly. Lungs:  Clear throughout to auscultation.   No wheezes, crackles, or rhonchi. No acute distress. Heart:  Regular rate and rhythm; no murmurs, clicks, rubs,  or gallops. Abdomen:  Normal bowel sounds.  Soft, nontender and nondistended. No masses, hepatosplenomegaly or hernias noted. No guarding or rebound tenderness.   Rectal:  Deferred   Msk:  Symmetrical. Pulses:  Normal pulses noted. Extremities:  Without edema. Neurologic:  Alert and oriented x4; grossly normal neurologically. Skin:  Intact without significant lesions or rashes. Cervical Nodes:  No significant cervical adenopathy. Psych:  Alert and cooperative. Normal mood and affect.

## 2013-03-05 NOTE — ED Notes (Signed)
Food impaction when eating at [employer].  ? Steak.  Cannot swallow water.  Recent weight loss, to see Dr Benard Rink. 3/21

## 2013-03-06 ENCOUNTER — Encounter (HOSPITAL_COMMUNITY): Admission: EM | Disposition: A | Discharge status: Home / Self Care | Payer: Self-pay | Source: Home / Self Care

## 2013-03-06 ENCOUNTER — Encounter: Payer: Self-pay | Admitting: Urgent Care

## 2013-03-06 DIAGNOSIS — T18108A Unspecified foreign body in esophagus causing other injury, initial encounter: Secondary | ICD-10-CM

## 2013-03-06 DIAGNOSIS — K222 Esophageal obstruction: Secondary | ICD-10-CM

## 2013-03-06 HISTORY — PX: ESOPHAGOGASTRODUODENOSCOPY: SHX5428

## 2013-03-06 SURGERY — EGD (ESOPHAGOGASTRODUODENOSCOPY)
Anesthesia: Moderate Sedation

## 2013-03-06 MED ORDER — ONDANSETRON HCL 4 MG/2ML IJ SOLN
INTRAMUSCULAR | Status: DC | PRN
  Administered 2013-03-06: 4 mg via INTRAVENOUS

## 2013-03-06 MED ORDER — STERILE WATER FOR IRRIGATION IR SOLN
Status: DC | PRN
  Administered 2013-03-06: 02:00:00

## 2013-03-06 MED ORDER — NITROGLYCERIN 0.4 MG SL SUBL
0.4000 mg | SUBLINGUAL_TABLET | Freq: Once | SUBLINGUAL | Status: AC
  Administered 2013-03-06: 0.4 mg via SUBLINGUAL
  Filled 2013-03-06: qty 25

## 2013-03-06 MED ORDER — MEPERIDINE HCL 100 MG/ML IJ SOLN
INTRAMUSCULAR | Status: DC | PRN
  Administered 2013-03-06 (×2): 25 mg via INTRAVENOUS

## 2013-03-06 MED ORDER — BUTAMBEN-TETRACAINE-BENZOCAINE 2-2-14 % EX AERO
INHALATION_SPRAY | CUTANEOUS | Status: DC | PRN
  Administered 2013-03-06: 2 via TOPICAL

## 2013-03-06 MED ORDER — SODIUM CHLORIDE 0.9 % IV SOLN
Freq: Once | INTRAVENOUS | Status: AC
  Administered 2013-03-06: 01:00:00 via INTRAVENOUS

## 2013-03-06 MED ORDER — MIDAZOLAM HCL 5 MG/5ML IJ SOLN
INTRAMUSCULAR | Status: DC | PRN
  Administered 2013-03-06 (×2): 1 mg via INTRAVENOUS

## 2013-03-06 NOTE — Op Note (Signed)
[facility] [address] [place], [address]   ENDOSCOPY PROCEDURE REPORT  PATIENT: Dustin [name], Dustin [name]  MR#: [id number] BIRTHDATE: 01/23/1934 , 78  yrs. old GENDER: Male ENDOSCOPIST: R.  Roetta Sessions, MD FACP FACG REFERRED BY:  Artis Delay, M.D. PROCEDURE DATE:  03/06/2013 PROCEDURE:     Emergency EGD with esophageal food impaction removal/incomplete EGD  INDICATIONS:     Esophageal food impaction  INFORMED CONSENT:   The risks, benefits, limitations, alternatives and imponderables have been discussed.  The potential for biopsy, esophogeal dilation, etc. have also been reviewed.  Questions have been answered.  All parties agreeable.  Please see the history and physical in the medical record for more information.  MEDICATIONS:     Versed 2 mg IV and Demerol 50 mg IV in divided doses. Cetacaine spray gargled. A Zofran 4 mg a  DESCRIPTION OF PROCEDURE:   The XB-[id number] ([id number])  endoscope was introduced through the mouth and advanced to the second portion of the duodenum without difficulty or limitations.  The mucosal surfaces were surveyed very carefully during advancement of the scope and upon withdrawal.  Retroflexion view of the proximal stomach and esophagogastric junction was performed.      FINDINGS: meat bolus " ball valving"  in the distal esophagus.  THERAPEUTIC / DIAGNOSTIC MANEUVERS PERFORMED: Meat bolus removed in piecemeal fashion with  tripod and Roth net - Bolus reduced in size to the point where the remaining bolus fell into the stomach. An inflamed peptic appearing stricture was seen at the EG junction. Do to the presence of food in the stomacht, no attempts at completing the EGD were made per plan.    COMPLICATIONS:  None  IMPRESSION:  Status post removal of esophageal food impaction/peptic appearing esophageal stricture. Incomplete EGD  RECOMMENDATIONS:  Full liquid diet-no meat, etc. Plan for elective EGD with esophageal dilation  as feasible next week. Begin Prevacid 30 mg orally daily. Carafate suspension 1 g 4 times a day x5 days.   _______________________________ R. Roetta Sessions, MD FACP [facility] eSigned:  R. Roetta Sessions, MD FACP [facility] 03/06/2013 2:09 AM     CC:

## 2013-03-06 NOTE — ED Notes (Signed)
Pt has been advised to be kept NPO, walked in and saw patients wife giving patient a sip of water. Again informed them that he needs to be kept NPO at this time. Pt and wife state understanding.

## 2013-03-06 NOTE — ED Notes (Signed)
Pt attempted to drink water, did not tolerate it and threw it back up, dr strand states now keep pt NPO. She is consulting GI

## 2013-03-06 NOTE — ED Provider Notes (Signed)
History     CSN: [id number]  Arrival date & time 03/05/13  2259   First MD Initiated Contact with Patient 03/05/13 2357      No chief complaint on file.   (Consider location/radiation/quality/duration/timing/severity/associated sxs/prior treatment) HPI Dustin [name] is a 79 y.o. male who presents to the Emergency Department complaining of inability to swallow a piece of steak he ate at supper 5 hours ago in a [employer]or[employer]. He has had problems in the past with swallowing but has never had anything stick in his throat. He actually went to see Dr. Jena Gauss today and is scheduled for an endoscopy next Friday.  Currently he is unable to take down water.  PCP Dr. [name] GI Dr. Jena Gauss Past Medical History  Diagnosis Date  . Arthritis   . CAD (coronary artery disease)   . Hyperlipidemia   . HTN (hypertension)   . MI (myocardial infarction)   . Hematuria   . OSA (obstructive sleep apnea)   . Dementia   . Anxiety   . OA (osteoarthritis)   . DDD (degenerative disc disease)     chronic back pain  . Alzheimer disease   . Enlarged prostate   . Thyroid disease   . Anginal pain   . Constipation     Past Surgical History  Procedure Laterality Date  . Coronary artery bypass graft      1996 x5:   LIMA to LAD, SVG D2, SVG to PDA, SVG to OM1,2  . Hernia repair    . Coronary angioplasty with stent placement    . Colonoscopy  08/03/2004    Jenkins-numerous large diverticula in the descending, transverse, descending, and sigmoid colon. Otherwise normal exam.  . Colonoscopy  07/12/2012    RMR: External hemorrhoidal tag; multiple rectal and colonic polyps removed and/or treated as described above. Pancolonic diverticulosis. Bx-tubular adenomas, rectal hyperplastic polyp. next colonoscopy in 06/2015.    Family History  Problem Relation Age of Onset  . Heart disease    . Colon cancer Neg Hx     History  Substance Use Topics  . Smoking status: Former Smoker    Types: Cigarettes    Quit date:  12/27/1994  . Smokeless tobacco: Never Used  . Alcohol Use: No      Review of Systems  Constitutional: Negative for fever.       10 Systems reviewed and are negative for acute change except as noted in the HPI.  HENT: Positive for trouble swallowing. Negative for congestion.   Eyes: Negative for discharge and redness.  Respiratory: Negative for cough and shortness of breath.   Cardiovascular: Negative for chest pain.  Gastrointestinal: Negative for vomiting and abdominal pain.  Musculoskeletal: Negative for back pain.  Skin: Negative for rash.  Neurological: Negative for syncope, numbness and headaches.  Psychiatric/Behavioral:       No behavior change.    Allergies  Review of patient's allergies indicates no known allergies.  Home Medications   Current Outpatient Rx  Name  Route  Sig  Dispense  Refill  . ALPRAZolam (XANAX) 1 MG tablet   Oral   Take 1 mg by mouth daily as needed. For anxiety          . amLODipine (NORVASC) 10 MG tablet   Oral   Take 1 tablet (10 mg total) by mouth daily.   30 tablet   6   . carvedilol (COREG) 3.125 MG tablet   Oral   Take 3.125 mg by mouth every morning.          [name]  clopidogrel (PLAVIX) 75 MG tablet   Oral   Take 75 mg by mouth daily.           [name] donepezil (ARICEPT) 10 MG tablet   Oral   Take 1 tablet by mouth At bedtime.         [name] escitalopram (LEXAPRO) 10 MG tablet   Oral   Take 10 mg by mouth daily.         . fentaNYL (DURAGESIC - DOSED MCG/HR) 100 MCG/HR   Transdermal   Place 1 patch onto the skin every 3 (three) days.          [name] gabapentin (NEURONTIN) 100 MG capsule   Oral   Take 100 mg by mouth daily.          . hydrochlorothiazide (,MICROZIDE/HYDRODIURIL,) 12.5 MG capsule   Oral   Take 12.5 mg by mouth daily.          [name] levothyroxine (SYNTHROID, LEVOTHROID) 25 MCG tablet   Oral   Take 25 mcg by mouth daily.          [name] lubiprostone (AMITIZA) 24 MCG capsule   Oral   Take 1 capsule (24 mcg  total) by mouth 2 (two) times daily with a meal.   60 capsule   3   . NAMENDA 5 MG tablet   Oral   Take 5 mg by mouth daily.          . nitroGLYCERIN (NITROSTAT) 0.4 MG SL tablet   Sublingual   Place 1 tablet (0.4 mg total) under the tongue every 5 (five) minutes as needed. For chest pains   25 tablet   11   . oxymorphone (OPANA) 10 MG tablet   Oral   Take 10 mg by mouth every 6 (six) hours as needed.          . pravastatin (PRAVACHOL) 40 MG tablet   Oral   Take 1 tablet (40 mg total) by mouth daily.   30 tablet   6     BP 139/63  Pulse 54  Temp(Src) 98.1 F (36.7 C) (Oral)  Resp 20  Ht 5\' 9"  (1.753 m)  Wt 170 lb (77.111 kg)  BMI 25.09 kg/m2  SpO2 97%  Physical Exam  Nursing note and vitals reviewed. Constitutional: He appears well-developed and well-nourished.  Awake, alert, nontoxic appearance.  HENT:  Head: Normocephalic and atraumatic.  Right Ear: External ear normal.  Left Ear: External ear normal.  Mouth/Throat: Oropharynx is clear and moist.  uanble to swallow water.  Eyes: EOM are normal. Pupils are equal, round, and reactive to light. Right eye exhibits no discharge. Left eye exhibits no discharge.  Neck: Neck supple.  Cardiovascular: Normal rate and intact distal pulses.   Pulmonary/Chest: Effort normal and breath sounds normal. He exhibits no tenderness.  Abdominal: Soft. Bowel sounds are normal. There is no tenderness. There is no rebound.  Musculoskeletal: Normal range of motion. He exhibits no tenderness.  Baseline ROM, no obvious new focal weakness.  Neurological:  Mental status and motor strength appears baseline for patient and situation.  Skin: No rash noted.  Psychiatric: He has a normal mood and affect.    ED Course  Procedures (including critical care time)    12:19 AM:  T/C to Dr. Jena Gauss, GI case discussed, including:  HPI, pertinent PM/SHx, VS/PE, dx testing, ED course and treatment.  Agreeable to see the patient and  disimpact..    MDM  Patient who ate steak for dinner  and is unable to get the last piece of steak down. Called Dr. Jena Gauss, GI who will be in to take the patient to the Endo Suite.   MDM Reviewed: nursing note and vitals           Nicoletta Dress. Colon Branch, MD 03/06/13 4098

## 2013-03-06 NOTE — Assessment & Plan Note (Addendum)
Dustin [name] is a pleasant 79 y.o. male with relatively high-functioning Alzheimer's dementia who is having progressive worsening of his cognition who presents with 12# weight loss, early satiety,dysguesia, & anorexia.  He is caring for his ill wife.  He is unable to provide meals for himself & his wife & has help only a few days per week when his son brings meals.  He stays in bed most days.  EGD with Dr Jena Gauss to evaluate for mass, PUD, H pylori/gastritis.  I have discussed risks & benefits which include, but are not limited to, bleeding, infection, perforation & drug reaction.  The patient agrees with this plan & written consent will be obtained.  He is on PLAVIX.  At this point,I feel patient would benefit from outside assistance.  We will make contact with Meals on Wheels.  Advised pt to discuss sitter or nursing options with Dr [name].

## 2013-03-06 NOTE — ED Notes (Signed)
Pt wheeled to endoscopy, handed off to RN at endoscopy at this time

## 2013-03-06 NOTE — H&P (Addendum)
Primary Care Physician:  Cassell Smiles., MD  Primary Gastroenterologist:  Dr. Jena Gauss  Pre-Procedure History & Physical: HPI:  Dustin [name] is a 79 y.o. male here for for urgent evaluation of probable food impaction. Patient presented to ED last evening (03/05/13) with complaint of a piece of steak getting stuck while at a restaurant at supper around 4 PM yesterday. Stated time and tried to induce vomiting to dislodge the impaction. He was unsuccessful.    Hasn't been able to swallow even water. Dr. Colon Branch evaluated and called me.  Apparently, seen in my office yesterday by mid-level practitioner.   Was being set up for elective EGD for weight loss and early satiety.  Denies chronic reflux symptoms. No acid suppression therapy chronically. Patient does describe intermittent episodes of transient food impactions over the past few years. Has chronic constipation on daily narcotic therapy.  Past Medical History  Diagnosis Date  . Arthritis   . CAD (coronary artery disease)   . Hyperlipidemia   . HTN (hypertension)   . MI (myocardial infarction)   . Hematuria   . OSA (obstructive sleep apnea)   . Dementia   . Anxiety   . OA (osteoarthritis)   . DDD (degenerative disc disease)     chronic back pain  . Alzheimer disease   . Enlarged prostate   . Thyroid disease   . Anginal pain   . Constipation     Past Surgical History  Procedure Laterality Date  . Coronary artery bypass graft      1996 x5:   LIMA to LAD, SVG D2, SVG to PDA, SVG to OM1,2  . Hernia repair    . Coronary angioplasty with stent placement    . Colonoscopy  08/03/2004    Jenkins-numerous large diverticula in the descending, transverse, descending, and sigmoid colon. Otherwise normal exam.  . Colonoscopy  07/12/2012    RMR: External hemorrhoidal tag; multiple rectal and colonic polyps removed and/or treated as described above. Pancolonic diverticulosis. Bx-tubular adenomas, rectal hyperplastic polyp. next colonoscopy in  06/2015.    Prior to Admission medications   Medication Sig Start Date End Date Taking? Authorizing Provider  ALPRAZolam Prudy Feeler) 1 MG tablet Take 1 mg by mouth daily as needed. For anxiety     Historical Provider, MD  amLODipine (NORVASC) 10 MG tablet Take 1 tablet (10 mg total) by mouth daily. 10/11/11   Herby Abraham, MD  carvedilol (COREG) 3.125 MG tablet Take 3.125 mg by mouth every morning.     Historical Provider, MD  clopidogrel (PLAVIX) 75 MG tablet Take 75 mg by mouth daily.      Historical Provider, MD  donepezil (ARICEPT) 10 MG tablet Take 1 tablet by mouth At bedtime. 07/01/12   Historical Provider, MD  escitalopram (LEXAPRO) 10 MG tablet Take 10 mg by mouth daily.    Historical Provider, MD  fentaNYL (DURAGESIC - DOSED MCG/HR) 100 MCG/HR Place 1 patch onto the skin every 3 (three) days.  06/19/12   Historical Provider, MD  gabapentin (NEURONTIN) 100 MG capsule Take 100 mg by mouth daily.  08/18/12   Historical Provider, MD  hydrochlorothiazide (,MICROZIDE/HYDRODIURIL,) 12.5 MG capsule Take 12.5 mg by mouth daily.  07/12/11   Historical Provider, MD  levothyroxine (SYNTHROID, LEVOTHROID) 25 MCG tablet Take 25 mcg by mouth daily.  06/19/12   Historical Provider, MD  lubiprostone (AMITIZA) 24 MCG capsule Take 1 capsule (24 mcg total) by mouth 2 (two) times daily with a meal. 11/08/12   Leanna Battles  Lewis, PA-C  NAMENDA 5 MG tablet Take 5 mg by mouth daily.  07/23/11   Historical Provider, MD  nitroGLYCERIN (NITROSTAT) 0.4 MG SL tablet Place 1 tablet (0.4 mg total) under the tongue every 5 (five) minutes as needed. For chest pains 07/19/12   Tonny Bollman, MD  oxymorphone (OPANA) 10 MG tablet Take 10 mg by mouth every 6 (six) hours as needed.  06/28/12   Historical Provider, MD  pravastatin (PRAVACHOL) 40 MG tablet Take 1 tablet (40 mg total) by mouth daily. 09/28/12   Herby Abraham, MD    Allergies as of 03/05/2013  . (No Known Allergies)    Family History  Problem Relation Age of Onset   . Heart disease    . Colon cancer Neg Hx     History   Social History  . Marital Status: Married    Spouse Name: N/A    Number of Children: N/A  . Years of Education: N/A   Occupational History  . retired    Social History Main Topics  . Smoking status: Former Smoker    Types: Cigarettes    Quit date: 12/27/1994  . Smokeless tobacco: Never Used  . Alcohol Use: No  . Drug Use: No  . Sexually Active: Not on file   Other Topics Concern  . Not on file   Social History Narrative  . No narrative on file    Review of Systems: See HPI, otherwise negative ROS  Physical Exam: BP 139/63  Pulse 54  Temp(Src) 98.1 F (36.7 C) (Oral)  Resp 20  Ht 5\' 9"  (1.753 m)  Wt 170 lb (77.111 kg)  BMI 25.09 kg/m2  SpO2 97% General:   Alert,  Well-developed, well-nourished, pleasant and cooperative in NAD Skin:  Intact without significant lesions or rashes. Eyes:  Sclera clear, no icterus.   Conjunctiva pink. Ears:  Normal auditory acuity. Nose:  No deformity, discharge,  or lesions. Mouth:  No deformity or lesions. Neck:  Supple; no masses or thyromegaly. No significant cervical adenopathy. Lungs:  Clear throughout to auscultation.   No wheezes, crackles, or rhonchi. No acute distress. Heart:  Regular rate and rhythm; no murmurs, clicks, rubs,  or gallops. Abdomen: Non-distended, normal bowel sounds.  Soft and nontender without appreciable mass or hepatosplenomegaly.  Pulses:  Normal pulses noted. Extremities:  Without clubbing or edema.  Impression/Plan:  79 year old gentleman presented to ED this evening with what sounds like an esophageal food impaction. Seen in our office yesterday. Elective EGD planned.  Urgent EGD now being performed for disimpaction as appropriate. The risks, benefits, limitations, alternatives and imponderables have been reviewed with the patient.  Patient understands that dilation will be not performed this evening if he has an impaction and, likely,  complete examination may not be carried out this evening per plan;  He would need to return for elective EGD with dilation as needed.  Questions have been answered. All parties agreeable. Discuss with wife in detail.

## 2013-03-06 NOTE — Assessment & Plan Note (Addendum)
Chronic narcotic-induced constipation.  Unable to afford amitiza, but had great results.  Recent colonoscopy with Dr Jena Gauss reassuring.    Samples Amitiza 24 mcg once or twice daily for constipation, #1 month supply given. Call when he runs out Constipation literature

## 2013-03-07 ENCOUNTER — Encounter (HOSPITAL_COMMUNITY): Payer: Self-pay | Admitting: Pharmacy Technician

## 2013-03-07 NOTE — Progress Notes (Signed)
Faxed to PCP

## 2013-03-07 NOTE — Progress Notes (Signed)
Patient ID: MD SMOLA, male   DOB: [birth year], 79 y.o.   MRN: [id number] Talked with Morrie Sheldon with [employer] about meals on wheels. She is going to try and reach out to see if they can get them some help.

## 2013-03-08 LAB — COMPREHENSIVE METABOLIC PANEL
CO2: 31 mmol/L
Creat: 0.8
Glucose: 94
Total Bilirubin: 0.8 mg/dL

## 2013-03-08 LAB — CBC
Amylase: 24 units/L — AB (ref 25–110)
HCT: 43 %
Hemoglobin: 14.6 g/dL (ref 13.5–17.5)
Lipase: 13 units/L (ref 0–53)
WBC: 7.3

## 2013-03-15 ENCOUNTER — Ambulatory Visit (INDEPENDENT_AMBULATORY_CARE_PROVIDER_SITE_OTHER): Payer: Medicare Other | Admitting: Cardiology

## 2013-03-15 ENCOUNTER — Encounter: Payer: Self-pay | Admitting: Cardiology

## 2013-03-15 VITALS — BP 120/70 | HR 56 | Wt 175.0 lb

## 2013-03-15 DIAGNOSIS — E785 Hyperlipidemia, unspecified: Secondary | ICD-10-CM

## 2013-03-15 NOTE — Progress Notes (Signed)
HPI: This nice patient seen today in a followup visit. He has been losing weight over the past several months, and this started primarily when he started taking thyroid replacement hormone. He feels that it has suppressed his appetite. Nonetheless, it seems from what was described as a very appropriate treatment for him. Couple days ago he got a piece of the stuck in his throat, and subtotally had to have this removed endoscopically. He scheduled for an EGD tomorrow. He is not on aspirin, but is on Plavix. He denies any progressive chest discomfort, but does note that he has occasional discomfort at night. Several months ago we have discussed repeat cardiac catheterization, but the patient wanted to hold off, and he has been generally stable. There are no new unstable symptoms.  Current Outpatient Prescriptions  Medication Sig Dispense Refill  . ALPRAZolam (XANAX) 1 MG tablet Take 1 mg by mouth daily as needed. For anxiety       . amLODipine (NORVASC) 10 MG tablet Take 1 tablet (10 mg total) by mouth daily.  30 tablet  6  . carvedilol (COREG) 3.125 MG tablet Take 3.125 mg by mouth every morning.       . clopidogrel (PLAVIX) 75 MG tablet Take 75 mg by mouth daily.        [name] donepezil (ARICEPT) 10 MG tablet Take 1 tablet by mouth At bedtime.      [name] escitalopram (LEXAPRO) 10 MG tablet Take 10 mg by mouth daily.      . fentaNYL (DURAGESIC - DOSED MCG/HR) 100 MCG/HR Place 1 patch onto the skin every 3 (three) days.       [name] gabapentin (NEURONTIN) 100 MG capsule Take 100 mg by mouth daily.       . hydrochlorothiazide (,MICROZIDE/HYDRODIURIL,) 12.5 MG capsule Take 12.5 mg by mouth daily.       . lansoprazole (PREVACID) 30 MG capsule Take 30 mg by mouth daily.       [name] levothyroxine (SYNTHROID, LEVOTHROID) 25 MCG tablet Take 25 mcg by mouth daily.       [name] lubiprostone (AMITIZA) 24 MCG capsule Take 1 capsule (24 mcg total) by mouth 2 (two) times daily with a meal.  60 capsule  3  . NAMENDA 5 MG tablet Take 5  mg by mouth daily.       . nitroGLYCERIN (NITROSTAT) 0.4 MG SL tablet Place 1 tablet (0.4 mg total) under the tongue every 5 (five) minutes as needed. For chest pains  25 tablet  11  . oxymorphone (OPANA) 10 MG tablet Take 10 mg by mouth every 6 (six) hours as needed.       . pravastatin (PRAVACHOL) 40 MG tablet Take 1 tablet (40 mg total) by mouth daily.  30 tablet  6   No current facility-administered medications for this visit.    No Known Allergies  Past Medical History  Diagnosis Date  . Arthritis   . CAD (coronary artery disease)   . Hyperlipidemia   . HTN (hypertension)   . MI (myocardial infarction)   . Hematuria   . OSA (obstructive sleep apnea)   . Dementia   . Anxiety   . OA (osteoarthritis)   . DDD (degenerative disc disease)     chronic back pain  . Alzheimer disease   . Enlarged prostate   . Thyroid disease   . Anginal pain   . Constipation     Past Surgical History  Procedure Laterality Date  . Coronary artery bypass  graft      1996 x5:   LIMA to LAD, SVG D2, SVG to PDA, SVG to OM1,2  . Hernia repair    . Coronary angioplasty with stent placement    . Colonoscopy  08/03/2004    Jenkins-numerous large diverticula in the descending, transverse, descending, and sigmoid colon. Otherwise normal exam.  . Colonoscopy  07/12/2012    RMR: External hemorrhoidal tag; multiple rectal and colonic polyps removed and/or treated as described above. Pancolonic diverticulosis. Bx-tubular adenomas, rectal hyperplastic polyp. next colonoscopy in 06/2015.    Family History  Problem Relation Age of Onset  . Heart disease    . Colon cancer Neg Hx     History   Social History  . Marital Status: Married    Spouse Name: N/A    Number of Children: N/A  . Years of Education: N/A   Occupational History  . retired    Social History Main Topics  . Smoking status: Former Smoker    Types: Cigarettes    Quit date: 12/27/1994  . Smokeless tobacco: Never Used  . Alcohol Use:  No  . Drug Use: No  . Sexually Active: Not on file   Other Topics Concern  . Not on file   Social History Narrative   Lives w/ ailing wife.   Son brings meals 3x per week    ROS: Please see the HPI.  All other systems reviewed and negative.  PHYSICAL EXAM:  BP 120/70  Pulse 56  Wt 175 lb (79.379 kg)  BMI 25.83 kg/m2  General: Well developed, well nourished, in no acute distress. Head:  Normocephalic and atraumatic. Neck: no JVD Lungs: Clear to auscultation and percussion. Heart: Normal S1 and S2.  No murmur, rubs or gallops.  Abdomen:  Normal bowel sounds; soft; non tender; no organomegaly Pulses: Pulses normal in all 4 extremities. Extremities: No clubbing or cyanosis. No edema. Neurologic: Alert and oriented x 3.  EKG: None today  ASSESSMENT AND PLAN:  1.  CAD that includes the SVG--- see note.   2.  Hyperlipidemia on therapy. 3.  Recent weight loss 4.  Early dementia. 5.  Former smoker.    FU Dr. Diona Browner in three months.

## 2013-03-15 NOTE — Patient Instructions (Addendum)
Your physician recommends that you schedule a follow-up appointment in: 3 MONTHS with Dr Diona Browner in the [place] office (previous pt of Dr [name])  Your physician recommends that you continue on your current medications as directed. Please refer to the Current Medication list given to you today.

## 2013-03-16 ENCOUNTER — Ambulatory Visit (HOSPITAL_COMMUNITY)
Admission: RE | Admit: 2013-03-16 | Discharge: 2013-03-16 | Disposition: A | Discharge status: Home / Self Care | Payer: Medicare Other | Source: Ambulatory Visit | Attending: Internal Medicine | Admitting: Internal Medicine

## 2013-03-16 ENCOUNTER — Encounter (HOSPITAL_COMMUNITY): Payer: Self-pay

## 2013-03-16 ENCOUNTER — Encounter (HOSPITAL_COMMUNITY)
Admission: RE | Disposition: A | Discharge status: Home / Self Care | Payer: Self-pay | Source: Ambulatory Visit | Attending: Internal Medicine

## 2013-03-16 DIAGNOSIS — IMO0002 Reserved for concepts with insufficient information to code with codable children: Secondary | ICD-10-CM | POA: Insufficient documentation

## 2013-03-16 DIAGNOSIS — R630 Anorexia: Secondary | ICD-10-CM

## 2013-03-16 DIAGNOSIS — R634 Abnormal weight loss: Secondary | ICD-10-CM

## 2013-03-16 DIAGNOSIS — T18108A Unspecified foreign body in esophagus causing other injury, initial encounter: Secondary | ICD-10-CM | POA: Insufficient documentation

## 2013-03-16 DIAGNOSIS — R6881 Early satiety: Secondary | ICD-10-CM

## 2013-03-16 DIAGNOSIS — R933 Abnormal findings on diagnostic imaging of other parts of digestive tract: Secondary | ICD-10-CM

## 2013-03-16 DIAGNOSIS — K5900 Constipation, unspecified: Secondary | ICD-10-CM

## 2013-03-16 DIAGNOSIS — K5909 Other constipation: Secondary | ICD-10-CM

## 2013-03-16 HISTORY — PX: ESOPHAGOGASTRODUODENOSCOPY: SHX5428

## 2013-03-16 SURGERY — EGD (ESOPHAGOGASTRODUODENOSCOPY)
Anesthesia: Moderate Sedation

## 2013-03-16 MED ORDER — SODIUM CHLORIDE 0.45 % IV SOLN: INTRAVENOUS | Status: DC

## 2013-03-16 MED ORDER — MEPERIDINE HCL 100 MG/ML IJ SOLN
INTRAMUSCULAR | Status: AC
  Filled 2013-03-16: qty 2

## 2013-03-16 MED ORDER — MIDAZOLAM HCL 5 MG/5ML IJ SOLN
INTRAMUSCULAR | Status: DC | PRN
  Administered 2013-03-16: 1 mg via INTRAVENOUS

## 2013-03-16 MED ORDER — BUTAMBEN-TETRACAINE-BENZOCAINE 2-2-14 % EX AERO
INHALATION_SPRAY | CUTANEOUS | Status: DC | PRN
  Administered 2013-03-16: 2 via TOPICAL

## 2013-03-16 MED ORDER — PROMETHAZINE HCL 25 MG/ML IJ SOLN
INTRAMUSCULAR | Status: AC
  Filled 2013-03-16: qty 1

## 2013-03-16 MED ORDER — SODIUM CHLORIDE 0.9 % IJ SOLN
INTRAMUSCULAR | Status: AC
  Filled 2013-03-16: qty 10

## 2013-03-16 MED ORDER — MIDAZOLAM HCL 5 MG/5ML IJ SOLN
INTRAMUSCULAR | Status: AC
  Filled 2013-03-16: qty 10

## 2013-03-16 MED ORDER — PROMETHAZINE HCL 25 MG/ML IJ SOLN
12.5000 mg | Freq: Once | INTRAMUSCULAR | Status: AC
  Administered 2013-03-16: 12.5 mg via INTRAVENOUS

## 2013-03-16 MED ORDER — SODIUM CHLORIDE 0.9 % IV SOLN
INTRAVENOUS | Status: DC
  Administered 2013-03-16: 1000 mL via INTRAVENOUS

## 2013-03-16 MED ORDER — ONDANSETRON HCL 4 MG/2ML IJ SOLN
INTRAMUSCULAR | Status: AC
  Filled 2013-03-16: qty 2

## 2013-03-16 MED ORDER — STERILE WATER FOR IRRIGATION IR SOLN
Status: DC | PRN
  Administered 2013-03-16: 11:00:00

## 2013-03-16 NOTE — Op Note (Addendum)
[facility] [address] [place], [address]   ENDOSCOPY PROCEDURE REPORT  PATIENT: Dustin [name], Dustin [name]  MR#: [id number] BIRTHDATE: Mar 03, 1934 , 78  yrs. old GENDER: Male ENDOSCOPIST: R.  Roetta Sessions, MD FACP FACG REFERRED BY:  Artis Delay, M.D. PROCEDURE DATE:  03/16/2013 PROCEDURE:     EGD with gastric biopsy  INDICATIONS:     Early satiety/weight loss; chronic constipation/chronic narcotic therapy/dementia History of recent food impaction.  INFORMED CONSENT:   The risks, benefits, limitations, alternatives and imponderables have been discussed.  The potential for biopsy, esophogeal dilation, etc. have also been reviewed.  Questions have been answered.  All parties agreeable.  Please see the history and physical in the medical record for more information.  MEDICATIONS:    Versed 1 mg IV and Phenergan 12.5 mg IV and Zofran 4 mg IV; Cetacaine spray  DESCRIPTION OF PROCEDURE:   The AO-1308M ([id number])  endoscope was introduced through the mouth and advanced to the second portion of the duodenum without difficulty or limitations.  The mucosal surfaces were surveyed very carefully during advancement of the scope and upon withdrawal.  Retroflexion view of the proximal stomach and esophagogastric junction was performed.      FINDINGS:  Normal-appearing, patent tubular esophagus. Stomach empty. Gastric mucosa diffusely abnormal with a "fish scale" or snakeskin appearance. Some scarring and minimal nodularity of the antrum mucosa. No ulcer or obvious infiltrating process observed. Patent pylorus. Normal first and second portion of the duodenum.  THERAPEUTIC / DIAGNOSTIC MANEUVERS PERFORMED:  Biopsy of the abnormal gastric mucosa taken   COMPLICATIONS:  None  IMPRESSION:   Abnormal appearing gastric mucosa of uncertain significance - status post biopsy; Patent tubular esophagus. He may have underlying esophageal motility disorder  RECOMMENDATIONS:   Followup on pathology. Continue Amitiza twice daily to manage constipation; may use MiraLax 1 capful twice daily as needed as well. Continue Prevacid 30 mg daily. Office visit in 6 weeks.    _______________________________ R. Roetta Sessions, MD FACP [facility] eSigned:  R. Roetta Sessions, MD FACP [facility] 03/16/2013 11:20 AM Revised: 03/16/2013 11:20 AM    CC:

## 2013-03-16 NOTE — H&P (View-Only) (Signed)
Referring Provider: [name], MD Primary Care Physician:  Cassell Smiles., MD Primary Gastroenterologist:  Dr. Jena Gauss  Chief Complaint  Patient presents with  . Follow-up    early satiety, constipation, weight loss    HPI:  Dustin [name] is a 79 y.o. male here re-referred by Dr [name] for early stiety & weight loss.  We had previously ongoing evaluation for opiod-induced constipation.  He has Alzheimer's disease & is confused about appt mix-up.  He thought he was seeing Dr Jena Gauss, but agreed to see me instead.  He had actually cancelled his last visit with Dr Jena Gauss.  He continued to c/o constipation.  He tells me he is "in the donut hole" and cannot afford amitiza although it worked well.  He was taking Amitiza BID PRN.  It was costing him $170.  He lives with his ailing wife.  He has a hard time preparing meals.  He feels like his dementia is worsening.  He is frustrated.  He has been taking 2-3 [employer] laxative tablets with a couple stool softeners.  He says he never feels cleaned out.  Denies dysphagia or odynophagia.  He "forces himself to eat & adds sugar to even get it down".  No appetite.  +early satiety.  Even smells make him sick.  His son in [place] brings meals 3 days per week.  He tells me most days he stays in the bed all day long feeling "sick".  Wife is too sick to get up too.  He c/o neuropathies in both legs & feet feel like walking on hot nails. Denies heartburn or indigestion, nausea or vomiting.    Denies abdominal pain.  His "taste is off", can't even drink tea/coke.  He was started on thyroid medication last year but admits to not taking his medications as ordered.  He has had a weight loss of 12# in the past year.    Labs reviewed from 01/26/13 CMP normal, CBC normal, amylase, lipase, TSH, and PSA normal.  Past Medical History  Diagnosis Date  . Arthritis   . CAD (coronary artery disease)   . Hyperlipidemia   . HTN (hypertension)   . MI (myocardial infarction)    . Hematuria   . OSA (obstructive sleep apnea)   . Dementia   . Anxiety   . OA (osteoarthritis)   . DDD (degenerative disc disease)     chronic back pain  . Alzheimer disease   . Enlarged prostate   . Thyroid disease   . Anginal pain   . Constipation     Past Surgical History  Procedure Laterality Date  . Coronary artery bypass graft      1996 x5:   LIMA to LAD, SVG D2, SVG to PDA, SVG to OM1,2  . Hernia repair    . Coronary angioplasty with stent placement    . Colonoscopy  08/03/2004    Jenkins-numerous large diverticula in the descending, transverse, descending, and sigmoid colon. Otherwise normal exam.  . Colonoscopy  07/12/2012    RMR: External hemorrhoidal tag; multiple rectal and colonic polyps removed and/or treated as described above. Pancolonic diverticulosis. Bx-tubular adenomas, rectal hyperplastic polyp. next colonoscopy in 06/2015.    Current Outpatient Prescriptions  Medication Sig Dispense Refill  . ALPRAZolam (XANAX) 1 MG tablet Take 1 mg by mouth daily as needed. For anxiety       . amLODipine (NORVASC) 10 MG tablet Take 1 tablet (10 mg total) by mouth daily.  30 tablet  6  .  carvedilol (COREG) 3.125 MG tablet Take 3.125 mg by mouth every morning.       . clopidogrel (PLAVIX) 75 MG tablet Take 75 mg by mouth daily.        [name] donepezil (ARICEPT) 10 MG tablet Take 1 tablet by mouth At bedtime.      [name] escitalopram (LEXAPRO) 10 MG tablet Take 10 mg by mouth daily.      . fentaNYL (DURAGESIC - DOSED MCG/HR) 100 MCG/HR Place 1 patch onto the skin every 3 (three) days.       [name] gabapentin (NEURONTIN) 100 MG capsule Take 100 mg by mouth daily.       . hydrochlorothiazide (,MICROZIDE/HYDRODIURIL,) 12.5 MG capsule Take 12.5 mg by mouth daily.       [name] levothyroxine (SYNTHROID, LEVOTHROID) 25 MCG tablet Take 25 mcg by mouth daily.       [name] lubiprostone (AMITIZA) 24 MCG capsule Take 1 capsule (24 mcg total) by mouth 2 (two) times daily with a meal.  60 capsule  3  . NAMENDA 5 MG  tablet Take 5 mg by mouth daily.       . nitroGLYCERIN (NITROSTAT) 0.4 MG SL tablet Place 1 tablet (0.4 mg total) under the tongue every 5 (five) minutes as needed. For chest pains  25 tablet  11  . oxymorphone (OPANA) 10 MG tablet Take 10 mg by mouth every 6 (six) hours as needed.       . pravastatin (PRAVACHOL) 40 MG tablet Take 1 tablet (40 mg total) by mouth daily.  30 tablet  6   No current facility-administered medications for this visit.    Allergies as of 03/05/2013  . (No Known Allergies)    Review of Systems: See HPI, otherwise negative  Physical Exam: BP 126/69  Pulse 52  Temp(Src) 98.3 F (36.8 C) (Oral)  Ht 5\' 9"  (1.753 m)  Wt 175 lb 9.6 oz (79.652 kg)  BMI 25.92 kg/m2 No LMP for male patient. General:   Alert,  Thin, pleasant and cooperative in NAD Head:  Normocephalic and atraumatic. Eyes:  Sclera clear, no icterus.   Conjunctiva pink. Ears:  Normal auditory acuity. Nose:  No deformity, discharge, or lesions. Mouth:  No deformity or lesions Neck:  Supple; no masses or thyromegaly. Lungs:  Clear throughout to auscultation.   No wheezes, crackles, or rhonchi. No acute distress. Heart:  Regular rate and rhythm; no murmurs, clicks, rubs,  or gallops. Abdomen:  Normal bowel sounds.  Soft, nontender and nondistended. No masses, hepatosplenomegaly or hernias noted. No guarding or rebound tenderness.   Rectal:  Deferred   Msk:  Symmetrical. Pulses:  Normal pulses noted. Extremities:  Without edema. Neurologic:  Alert and oriented x4; grossly normal neurologically. Skin:  Intact without significant lesions or rashes. Cervical Nodes:  No significant cervical adenopathy. Psych:  Alert and cooperative. Normal mood and affect.

## 2013-03-16 NOTE — Interval H&P Note (Signed)
History and Physical Interval Note:  03/16/2013 10:47 AM  Osborne Casco  has presented today for surgery, with the diagnosis of Unintentional Weight Loss, Anorexia and Constipation  The various methods of treatment have been discussed with the patient and family. After consideration of risks, benefits and other options for treatment, the patient has consented to  Procedure(s) with comments: ESOPHAGOGASTRODUODENOSCOPY (EGD) (N/A) - 12:00-moved to 1030 [address] Ann notified pt as a surgical intervention .  The patient's history has been reviewed, patient examined, no change in status, stable for surgery.  I have reviewed the patient's chart and labs.  Questions were answered to the patient's satisfaction.    Patient denies dysphagia. EGD now being done for weight loss anorexia/early satiety.The risks, benefits, limitations, alternatives and imponderables have been reviewed with the patient. Potential for esophageal dilation, biopsy, etc. have also been reviewed.  Questions have been answered. All parties agreeable.   Eula Listen

## 2013-03-18 NOTE — Assessment & Plan Note (Signed)
He has known vein graft disease. Continued medical therapy is indicated.

## 2013-03-18 NOTE — Assessment & Plan Note (Signed)
He is nicely controlled.  Currently on lower dose pravastatin.  Discussed statins and potential side effect issues.

## 2013-03-18 NOTE — Assessment & Plan Note (Signed)
Encouraged primary fu evaluation.

## 2013-03-19 ENCOUNTER — Encounter: Payer: Self-pay | Admitting: Internal Medicine

## 2013-03-19 ENCOUNTER — Encounter (HOSPITAL_COMMUNITY): Payer: Self-pay | Admitting: Internal Medicine

## 2013-03-20 ENCOUNTER — Encounter (HOSPITAL_COMMUNITY): Payer: Self-pay | Admitting: Internal Medicine

## 2013-03-24 ENCOUNTER — Encounter: Payer: Self-pay | Admitting: Internal Medicine

## 2013-04-27 ENCOUNTER — Ambulatory Visit: Payer: Medicare Other | Admitting: Gastroenterology

## 2013-06-18 ENCOUNTER — Ambulatory Visit (INDEPENDENT_AMBULATORY_CARE_PROVIDER_SITE_OTHER): Payer: Medicare Other | Admitting: Cardiology

## 2013-06-18 ENCOUNTER — Encounter: Payer: Self-pay | Admitting: Cardiology

## 2013-06-18 VITALS — BP 119/68 | HR 49 | Ht 69.0 in | Wt 170.2 lb

## 2013-06-18 DIAGNOSIS — I10 Essential (primary) hypertension: Secondary | ICD-10-CM

## 2013-06-18 DIAGNOSIS — E785 Hyperlipidemia, unspecified: Secondary | ICD-10-CM

## 2013-06-18 DIAGNOSIS — I2510 Atherosclerotic heart disease of native coronary artery without angina pectoris: Secondary | ICD-10-CM

## 2013-06-18 NOTE — Assessment & Plan Note (Signed)
On statin therapy, followed by Dr. [name].

## 2013-06-18 NOTE — Assessment & Plan Note (Signed)
Blood pressure is normal today. No changes made. 

## 2013-06-18 NOTE — Progress Notes (Signed)
Clinical Summary Dustin [name] is a 79 y.o.male presenting for a followup office visit. This is my first meeting with him, he is a former patient of Dr. [name] last seen in March of this year. He has been managed medically with history of multivessel CAD status post CABG, and with known graft disease.  Cardiac catheterization in August 2012 demonstrated patent LIMA to LAD, patent SVG to diagonal, patent SVG to OM1 and OM 2, and diseased SVG to the PDA treated with DES x 2 as well as more distal disease that was managed medically. LVEF was in the range of 50-55% at that time.  Labwork from January of this year revealed potassium 3.9, BUN 14, creatinine 0.8.  He reports no progressive angina symptoms, states he uses nitroglycerin occasionally. No cardiac hospitalizations.  He reports being under a lot of psychosocial stress due to family problems. Has lost approximately 15-20 pounds in the last 6 months, although this has been stable since March. He attributes this to stress and worry. Also suffers with chronic neuropathic pain, and is on narcotics under the direction of Dr. [name].  No Known Allergies  Current Outpatient Prescriptions  Medication Sig Dispense Refill  . ALPRAZolam (XANAX) 1 MG tablet Take 1 mg by mouth daily as needed. For anxiety       . amLODipine (NORVASC) 10 MG tablet Take 1 tablet (10 mg total) by mouth daily.  30 tablet  6  . carvedilol (COREG) 3.125 MG tablet Take 3.125 mg by mouth every morning.       . clopidogrel (PLAVIX) 75 MG tablet Take 75 mg by mouth daily.        [name] donepezil (ARICEPT) 10 MG tablet Take 1 tablet by mouth At bedtime.      [name] escitalopram (LEXAPRO) 10 MG tablet Take 10 mg by mouth daily.      . fentaNYL (DURAGESIC - DOSED MCG/HR) 100 MCG/HR Place 1 patch onto the skin every 3 (three) days.       [name] gabapentin (NEURONTIN) 100 MG capsule Take 100 mg by mouth daily.       . hydrochlorothiazide (,MICROZIDE/HYDRODIURIL,) 12.5 MG capsule Take 12.5 mg by  mouth daily.       . lansoprazole (PREVACID) 30 MG capsule Take 30 mg by mouth daily.       [name] levothyroxine (SYNTHROID, LEVOTHROID) 25 MCG tablet Take 25 mcg by mouth daily.       [name] lubiprostone (AMITIZA) 24 MCG capsule Take 1 capsule (24 mcg total) by mouth 2 (two) times daily with a meal.  60 capsule  3  . NAMENDA 5 MG tablet Take 5 mg by mouth daily.       . nitroGLYCERIN (NITROSTAT) 0.4 MG SL tablet Place 1 tablet (0.4 mg total) under the tongue every 5 (five) minutes as needed. For chest pains  25 tablet  11  . oxyCODONE (ROXICODONE) 15 MG immediate release tablet       . oxymorphone (OPANA) 10 MG tablet Take 10 mg by mouth every 6 (six) hours as needed.       . pravastatin (PRAVACHOL) 40 MG tablet Take 1 tablet (40 mg total) by mouth daily.  30 tablet  6   No current facility-administered medications for this visit.    Past Medical History  Diagnosis Date  . Arthritis   . Coronary atherosclerosis of native coronary artery     Multivessel status post CABG, DES x 2 SVG to PDA 8/12  . Mixed hyperlipidemia   .  Essential hypertension, benign   . MI (myocardial infarction)   . Hematuria   . OSA (obstructive sleep apnea)   . Anxiety   . OA (osteoarthritis)   . DDD (degenerative disc disease)     Chronic back pain  . Alzheimer disease   . Enlarged prostate   . Thyroid disease     Past Surgical History  Procedure Laterality Date  . Coronary artery bypass graft  1996    LIMA to LAD, SVG to D2, SVG to PDA, SVG to OM1 and OM2  . Hernia repair    . Coronary angioplasty with stent placement    . Colonoscopy  08/03/2004    Jenkins-numerous large diverticula in the descending, transverse, descending, and sigmoid colon. Otherwise normal exam.  . Colonoscopy  07/12/2012    RMR: External hemorrhoidal tag; multiple rectal and colonic polyps removed and/or treated as described above. Pancolonic diverticulosis. Bx-tubular adenomas, rectal hyperplastic polyp. next colonoscopy in 06/2015.  [name]  Esophagogastroduodenoscopy N/A 03/16/2013    Procedure: ESOPHAGOGASTRODUODENOSCOPY (EGD);  Surgeon: Corbin Ade, MD;  Location: [facility];  Service: Endoscopy;  Laterality: N/A;  12:00-moved to 1030 Leigh Ann notified pt  . Esophagogastroduodenoscopy N/A 03/06/2013    Procedure: ESOPHAGOGASTRODUODENOSCOPY (EGD);  Surgeon: Corbin Ade, MD;  Location: [facility];  Service: Endoscopy;  Laterality: N/A;    Social History Mr. Mazzola reports that he quit smoking about 18 years ago. His smoking use included Cigarettes. He smoked 0.00 packs per day. He has never used smokeless tobacco. Mr. Hartt reports that he does not drink alcohol.  Review of Systems No palpitations, dizziness, syncope. Otherwise as outlined.  Physical Examination Filed Vitals:   06/18/13 1452  BP: 119/68  Pulse: 49   Filed Weights   06/18/13 1452  Weight: 170 lb 4 oz (77.225 kg)   Comfortable at rest. HEENT: Conjunctiva and lids normal, oropharynx clear. Neck: Supple, no elevated JVP or carotid bruits, no thyromegaly. Lungs: Clear to auscultation, nonlabored breathing at rest. Cardiac: Regular rate and rhythm, no S3, soft systolic murmur, no pericardial rub. Abdomen: Soft, nontender, no guarding or rebound. Extremities: No pitting edema, distal pulses 2+. Skin: Warm and dry. Musculoskeletal: No kyphosis. Neuropsychiatric: Alert and oriented x3, calm.   Problem List and Plan   Coronary atherosclerosis of native coronary artery Multivessel disease as outlined above. Clinically stable at this time with intermittent angina. History and medications reviewed. For now will continue observation.  HYPERLIPIDEMIA-MIXED On statin therapy, followed by Dr. [name].  HYPERTENSION, BENIGN Blood pressure is normal today. No changes made.    Jonelle Sidle, M.D., F.A.C.C.

## 2013-06-18 NOTE — Assessment & Plan Note (Signed)
Multivessel disease as outlined above. Clinically stable at this time with intermittent angina. History and medications reviewed. For now will continue observation.

## 2013-06-18 NOTE — Patient Instructions (Addendum)
Your physician recommends that you schedule a follow-up appointment in: 4 months  

## 2013-07-06 ENCOUNTER — Encounter (HOSPITAL_COMMUNITY): Payer: Self-pay

## 2013-07-06 ENCOUNTER — Emergency Department (HOSPITAL_COMMUNITY)
Admission: EM | Admit: 2013-07-06 | Discharge: 2013-07-06 | Disposition: A | Discharge status: Home / Self Care | Payer: Medicare Other | Attending: Emergency Medicine | Admitting: Emergency Medicine

## 2013-07-06 DIAGNOSIS — L02519 Cutaneous abscess of unspecified hand: Secondary | ICD-10-CM | POA: Insufficient documentation

## 2013-07-06 DIAGNOSIS — Z87448 Personal history of other diseases of urinary system: Secondary | ICD-10-CM | POA: Insufficient documentation

## 2013-07-06 DIAGNOSIS — Z8739 Personal history of other diseases of the musculoskeletal system and connective tissue: Secondary | ICD-10-CM | POA: Insufficient documentation

## 2013-07-06 DIAGNOSIS — I10 Essential (primary) hypertension: Secondary | ICD-10-CM | POA: Insufficient documentation

## 2013-07-06 DIAGNOSIS — F411 Generalized anxiety disorder: Secondary | ICD-10-CM | POA: Insufficient documentation

## 2013-07-06 DIAGNOSIS — G4733 Obstructive sleep apnea (adult) (pediatric): Secondary | ICD-10-CM | POA: Insufficient documentation

## 2013-07-06 DIAGNOSIS — E782 Mixed hyperlipidemia: Secondary | ICD-10-CM | POA: Insufficient documentation

## 2013-07-06 DIAGNOSIS — Z87891 Personal history of nicotine dependence: Secondary | ICD-10-CM | POA: Insufficient documentation

## 2013-07-06 DIAGNOSIS — F0280 Dementia in other diseases classified elsewhere without behavioral disturbance: Secondary | ICD-10-CM | POA: Insufficient documentation

## 2013-07-06 DIAGNOSIS — G309 Alzheimer's disease, unspecified: Secondary | ICD-10-CM | POA: Insufficient documentation

## 2013-07-06 DIAGNOSIS — L03114 Cellulitis of left upper limb: Secondary | ICD-10-CM

## 2013-07-06 DIAGNOSIS — I2510 Atherosclerotic heart disease of native coronary artery without angina pectoris: Secondary | ICD-10-CM | POA: Insufficient documentation

## 2013-07-06 DIAGNOSIS — E079 Disorder of thyroid, unspecified: Secondary | ICD-10-CM | POA: Insufficient documentation

## 2013-07-06 DIAGNOSIS — I252 Old myocardial infarction: Secondary | ICD-10-CM | POA: Insufficient documentation

## 2013-07-06 DIAGNOSIS — Z79899 Other long term (current) drug therapy: Secondary | ICD-10-CM | POA: Insufficient documentation

## 2013-07-06 LAB — CBC WITH DIFFERENTIAL/PLATELET
Basophils Absolute: 0 10*3/uL (ref 0.0–0.1)
Eosinophils Relative: 2 % (ref 0–5)
HCT: 37.4 % — ABNORMAL LOW (ref 39.0–52.0)
Hemoglobin: 12.7 g/dL — ABNORMAL LOW (ref 13.0–17.0)
Lymphocytes Relative: 26 % (ref 12–46)
MCHC: 34 g/dL (ref 30.0–36.0)
MCV: 94.4 fL (ref 78.0–100.0)
Monocytes Absolute: 0.6 10*3/uL (ref 0.1–1.0)
Monocytes Relative: 8 % (ref 3–12)
RDW: 12.4 % (ref 11.5–15.5)
WBC: 6.8 10*3/uL (ref 4.0–10.5)

## 2013-07-06 LAB — POCT I-STAT, CHEM 8
BUN: 15 mg/dL (ref 6–23)
Calcium, Ion: 1.17 mmol/L (ref 1.13–1.30)
Chloride: 99 mEq/L (ref 96–112)
Creatinine, Ser: 0.9 mg/dL (ref 0.50–1.35)
Glucose, Bld: 88 mg/dL (ref 70–99)
Potassium: 3.5 mEq/L (ref 3.5–5.1)

## 2013-07-06 LAB — URINALYSIS, ROUTINE W REFLEX MICROSCOPIC
Bilirubin Urine: NEGATIVE
Ketones, ur: NEGATIVE mg/dL
Nitrite: NEGATIVE
Specific Gravity, Urine: 1.025 (ref 1.005–1.030)
pH: 6.5 (ref 5.0–8.0)

## 2013-07-06 MED ORDER — FAMOTIDINE 20 MG PO TABS
20.0000 mg | ORAL_TABLET | Freq: Once | ORAL | Status: AC
  Administered 2013-07-06: 20 mg via ORAL
  Filled 2013-07-06: qty 1

## 2013-07-06 MED ORDER — IBUPROFEN 400 MG PO TABS
600.0000 mg | ORAL_TABLET | Freq: Once | ORAL | Status: AC
  Administered 2013-07-06: 600 mg via ORAL
  Filled 2013-07-06: qty 2

## 2013-07-06 MED ORDER — SULFAMETHOXAZOLE-TRIMETHOPRIM 800-160 MG PO TABS: 1.0000 | ORAL_TABLET | Freq: Two times a day (BID) | ORAL | Status: DC

## 2013-07-06 MED ORDER — FAMOTIDINE 20 MG PO TABS: 20.0000 mg | ORAL_TABLET | Freq: Two times a day (BID) | ORAL | Status: DC

## 2013-07-06 NOTE — ED Notes (Signed)
Left hand swelling and painful. Possible insect bite per pt. My left hip and the left side of my back are hurting per pt.

## 2013-07-06 NOTE — ED Provider Notes (Signed)
History    CSN: [id number] Arrival date & time 07/06/13  1522  First MD Initiated Contact with Patient 07/06/13 1712     Chief Complaint  Patient presents with  . Hand Pain  . Insect Bite   (Consider location/radiation/quality/duration/timing/severity/associated sxs/prior Treatment) HPI Patient relates one to 2 days ago he started getting pain and swelling of the dorsum of his left hand. He denies any fall or known injury. He denies fever. He states he has chills and feels cold all the time "because I'm on a blood thinner". He relates his brother was recently admitted to the hospital for a brown recluse spider bite and he helped his brother move his mattress and cleaning his bedroom 3 or 4 days ago. He is not aware of any known spider bite. He states it hurts when he bends his fingers. Nothing makes it feel better. He denies any itching. He denies any prior history of gout. His wife also expresses concern that his dementia is getting worse and he seems more confused. She also states she's had decreased appetite for several months and is losingweight.  PCP Dr. [name]  Past Medical History  Diagnosis Date  . Arthritis   . Coronary atherosclerosis of native coronary artery     Multivessel status post CABG, DES x 2 SVG to PDA 8/12  . Mixed hyperlipidemia   . Essential hypertension, benign   . MI (myocardial infarction)   . Hematuria   . OSA (obstructive sleep apnea)   . Anxiety   . OA (osteoarthritis)   . DDD (degenerative disc disease)     Chronic back pain  . Alzheimer disease   . Enlarged prostate   . Thyroid disease    Past Surgical History  Procedure Laterality Date  . Coronary artery bypass graft  1996    LIMA to LAD, SVG to D2, SVG to PDA, SVG to OM1 and OM2  . Hernia repair    . Coronary angioplasty with stent placement    . Colonoscopy  08/03/2004    Jenkins-numerous large diverticula in the descending, transverse, descending, and sigmoid colon. Otherwise normal exam.    . Colonoscopy  07/12/2012    RMR: External hemorrhoidal tag; multiple rectal and colonic polyps removed and/or treated as described above. Pancolonic diverticulosis. Bx-tubular adenomas, rectal hyperplastic polyp. next colonoscopy in 06/2015.  [name] Esophagogastroduodenoscopy N/A 03/16/2013    Procedure: ESOPHAGOGASTRODUODENOSCOPY (EGD);  Surgeon: Corbin Ade, MD;  Location: [facility];  Service: Endoscopy;  Laterality: N/A;  12:00-moved to 1030 Leigh Ann notified pt  . Esophagogastroduodenoscopy N/A 03/06/2013    Procedure: ESOPHAGOGASTRODUODENOSCOPY (EGD);  Surgeon: Corbin Ade, MD;  Location: [facility];  Service: Endoscopy;  Laterality: N/A;   Family History  Problem Relation Age of Onset  . Heart disease    . Colon cancer Neg Hx    History  Substance Use Topics  . Smoking status: Former Smoker    Types: Cigarettes    Quit date: 12/27/1994  . Smokeless tobacco: Never Used  . Alcohol Use: No  lives at home lives with wife Review of Systems  Allergies  Review of patient's allergies indicates no known allergies.  Home Medications   Current Outpatient Rx  Name  Route  Sig  Dispense  Refill  . ALPRAZolam (XANAX) 1 MG tablet   Oral   Take 1 mg by mouth daily as needed. For anxiety          . amLODipine (NORVASC) 10 MG tablet  Oral   Take 1 tablet (10 mg total) by mouth daily.   30 tablet   6   . carvedilol (COREG) 3.125 MG tablet   Oral   Take 3.125 mg by mouth every morning.          . clopidogrel (PLAVIX) 75 MG tablet   Oral   Take 75 mg by mouth daily.           [name] donepezil (ARICEPT) 10 MG tablet   Oral   Take 1 tablet by mouth At bedtime.         [name] escitalopram (LEXAPRO) 10 MG tablet   Oral   Take 10 mg by mouth daily.         . fentaNYL (DURAGESIC - DOSED MCG/HR) 100 MCG/HR   Transdermal   Place 1 patch onto the skin every 3 (three) days.          [name] gabapentin (NEURONTIN) 100 MG capsule   Oral   Take 100 mg by mouth daily.           . hydrochlorothiazide (,MICROZIDE/HYDRODIURIL,) 12.5 MG capsule   Oral   Take 12.5 mg by mouth daily.          . lansoprazole (PREVACID) 30 MG capsule   Oral   Take 30 mg by mouth daily.          [name] levothyroxine (SYNTHROID, LEVOTHROID) 25 MCG tablet   Oral   Take 25 mcg by mouth daily.          [name] lubiprostone (AMITIZA) 24 MCG capsule   Oral   Take 1 capsule (24 mcg total) by mouth 2 (two) times daily with a meal.   60 capsule   3   . NAMENDA 5 MG tablet   Oral   Take 5 mg by mouth daily.          . nitroGLYCERIN (NITROSTAT) 0.4 MG SL tablet   Sublingual   Place 1 tablet (0.4 mg total) under the tongue every 5 (five) minutes as needed. For chest pains   25 tablet   11   . oxyCODONE (ROXICODONE) 15 MG immediate release tablet               . oxymorphone (OPANA) 10 MG tablet   Oral   Take 10 mg by mouth every 6 (six) hours as needed.          . pravastatin (PRAVACHOL) 40 MG tablet   Oral   Take 1 tablet (40 mg total) by mouth daily.   30 tablet   6    BP 124/56  Pulse 50  Temp(Src) 97.6 F (36.4 C) (Oral)  Resp 16  Ht 5\' 10"  (1.778 m)  Wt 180 lb (81.647 kg)  BMI 25.83 kg/m2  SpO2 94%  Vital signs normal except bradycardia  Physical Exam  Nursing note and vitals reviewed. Constitutional: He is oriented to person, place, and time. He appears well-developed and well-nourished.  Non-toxic appearance. He does not appear ill. No distress.  HENT:  Head: Normocephalic and atraumatic.  Right Ear: External ear normal.  Left Ear: External ear normal.  Nose: Nose normal. No mucosal edema or rhinorrhea.  Mouth/Throat: Oropharynx is clear and moist and mucous membranes are normal. No dental abscesses or edematous.  Eyes: Conjunctivae and EOM are normal. Pupils are equal, round, and reactive to light.  Neck: Normal range of motion and full passive range of motion without pain. Neck supple.  Cardiovascular: Normal rate and  regular rhythm.  Exam reveals no  gallop and no friction rub.   No murmur heard. Pulmonary/Chest: Effort normal and breath sounds normal. No respiratory distress. He has no wheezes. He has no rhonchi. He has no rales. He exhibits no crepitus.  Abdominal: Normal appearance.  Musculoskeletal: Normal range of motion. He exhibits edema and tenderness.       Hands: Moves all extremities well. Patient's noted to have moderate swelling and redness with mild warmth of the dorsum of his left hand without obvious bite sites seen. He has pain on range of motion of his fingers however he does have intact range of motion of his fingers. No epitrochlear or axillary lymph nodes felt, there were no red streaking seen.  Neurological: He is alert and oriented to person, place, and time. He has normal strength. No cranial nerve deficit.  Skin: Skin is warm, dry and intact. No rash noted. No erythema. No pallor.  Psychiatric: He has a normal mood and affect. His speech is normal and behavior is normal. His mood appears not anxious.    ED Course  Procedures (including critical care time) Medications  famotidine (PEPCID) tablet 20 mg (20 mg Oral Given 07/06/13 1744)  ibuprofen (ADVIL,MOTRIN) tablet 600 mg (600 mg Oral Given 07/06/13 1744)   States his pain is better. Pt had c/o chest pain earlier which he states lasted a few seconds or a few minutes, states he gets it when he is stressed and states he has been stressed today.    Results for orders placed during the hospital encounter of 07/06/13  URIC ACID      Result Value Range   Uric Acid, Serum 4.7  4.0 - 7.8 mg/dL  CBC WITH DIFFERENTIAL      Result Value Range   WBC 6.8  4.0 - 10.5 K/uL   RBC 3.96 (*) 4.22 - 5.81 MIL/uL   Hemoglobin 12.7 (*) 13.0 - 17.0 g/dL   HCT 16.1 (*) 09.6 - 04.5 %   MCV 94.4  78.0 - 100.0 fL   MCH 32.1  26.0 - 34.0 pg   MCHC 34.0  30.0 - 36.0 g/dL   RDW 40.9  81.1 - 91.4 %   Platelets 212  150 - 400 K/uL   Neutrophils Relative % 64  43 - 77 %   Neutro Abs 4.4   1.7 - 7.7 K/uL   Lymphocytes Relative 26  12 - 46 %   Lymphs Abs 1.7  0.7 - 4.0 K/uL   Monocytes Relative 8  3 - 12 %   Monocytes Absolute 0.6  0.1 - 1.0 K/uL   Eosinophils Relative 2  0 - 5 %   Eosinophils Absolute 0.1  0.0 - 0.7 K/uL   Basophils Relative 1  0 - 1 %   Basophils Absolute 0.0  0.0 - 0.1 K/uL  URINALYSIS, ROUTINE W REFLEX MICROSCOPIC      Result Value Range   Color, Urine AMBER (*) YELLOW   APPearance CLEAR  CLEAR   Specific Gravity, Urine 1.025  1.005 - 1.030   pH 6.5  5.0 - 8.0   Glucose, UA NEGATIVE  NEGATIVE mg/dL   Hgb urine dipstick TRACE (*) NEGATIVE   Bilirubin Urine NEGATIVE  NEGATIVE   Ketones, ur NEGATIVE  NEGATIVE mg/dL   Protein, ur NEGATIVE  NEGATIVE mg/dL   Urobilinogen, UA 1.0  0.0 - 1.0 mg/dL   Nitrite NEGATIVE  NEGATIVE   Leukocytes, UA NEGATIVE  NEGATIVE  URINE MICROSCOPIC-ADD ON  Result Value Range   RBC / HPF 0-2  <3 RBC/hpf  POCT I-STAT, CHEM 8      Result Value Range   Sodium 140  135 - 145 mEq/L   Potassium 3.5  3.5 - 5.1 mEq/L   Chloride 99  96 - 112 mEq/L   BUN 15  6 - 23 mg/dL   Creatinine, Ser 4.54  0.50 - 1.35 mg/dL   Glucose, Bld 88  70 - 99 mg/dL   Calcium, Ion 0.98  1.19 - 1.30 mmol/L   TCO2 31  0 - 100 mmol/L   Hemoglobin 12.6 (*) 13.0 - 17.0 g/dL   HCT 14.7 (*) 82.9 - 56.2 %   Laboratory interpretation all normal except mild anemia   Date: 07/06/2013  Rate: 42  Rhythm: sinus bradycardia  QRS Axis: normal  Intervals: normal  ST/T Wave abnormalities: normal  Conduction Disutrbances:left bundle branch block  Narrative Interpretation:   Old EKG Reviewed: unchanged from 03/05/2013 HR was 54   1. Cellulitis of hand, left     New Prescriptions   FAMOTIDINE (PEPCID) 20 MG TABLET    Take 1 tablet (20 mg total) by mouth 2 (two) times daily.   SULFAMETHOXAZOLE-TRIMETHOPRIM (SEPTRA DS) 800-160 MG PER TABLET    Take 1 tablet by mouth every 12 (twelve) hours.     Plan discharge  Devoria Albe, MD, FACEP    MDM     Ward Givens, MD 07/06/13 [phone]

## 2013-07-11 ENCOUNTER — Other Ambulatory Visit: Payer: Self-pay | Admitting: Cardiology

## 2013-08-24 ENCOUNTER — Other Ambulatory Visit: Payer: Self-pay | Admitting: *Deleted

## 2013-08-24 MED ORDER — NITROGLYCERIN 0.4 MG SL SUBL: 0.4000 mg | SUBLINGUAL_TABLET | SUBLINGUAL | Status: DC | PRN

## 2013-11-05 ENCOUNTER — Ambulatory Visit (INDEPENDENT_AMBULATORY_CARE_PROVIDER_SITE_OTHER): Payer: Medicare Other | Admitting: Cardiology

## 2013-11-05 ENCOUNTER — Encounter: Payer: Self-pay | Admitting: Cardiology

## 2013-11-05 VITALS — BP 121/62 | HR 51 | Ht 69.0 in | Wt 175.0 lb

## 2013-11-05 DIAGNOSIS — E785 Hyperlipidemia, unspecified: Secondary | ICD-10-CM

## 2013-11-05 DIAGNOSIS — I10 Essential (primary) hypertension: Secondary | ICD-10-CM

## 2013-11-05 DIAGNOSIS — I2510 Atherosclerotic heart disease of native coronary artery without angina pectoris: Secondary | ICD-10-CM

## 2013-11-05 NOTE — Progress Notes (Signed)
Clinical Summary Dustin [name] is a 79 y.o.male last seen in June. He presents with his wife for a regular visit. He denies any specific angina symptoms. Has multiple other complaints including headaches, forgetfulness, reported Alzheimer dementia, chronic leg pain on narcotics, bad dreams. He states that he has pending visits with Dr. [name] and also neurology.  Cardiac medications reviewed. States that he does forget to take his medicines sometimes. He is due for lipid followup with Dr. [name].  Cardiac catheterization in August 2012 demonstrated patent LIMA to LAD, patent SVG to diagonal, patent SVG to OM1 and OM 2, and diseased SVG to the PDA treated with DES x 2 as well as more distal disease that was managed medically. LVEF was in the range of 50-55% at that time. We continue medical therapy and observation.   No Known Allergies  Current Outpatient Prescriptions  Medication Sig Dispense Refill  . ALPRAZolam (XANAX) 1 MG tablet Take 1 mg by mouth daily as needed. For anxiety       . amLODipine (NORVASC) 10 MG tablet Take 1 tablet (10 mg total) by mouth daily.  30 tablet  6  . butalbital-acetaminophen-caffeine (FIORICET, ESGIC) 50-325-40 MG per tablet Take 1 tablet by mouth 4 (four) times daily as needed.      . carvedilol (COREG) 3.125 MG tablet Take 3.125 mg by mouth every morning.       . clopidogrel (PLAVIX) 75 MG tablet Take 75 mg by mouth daily.        [name] donepezil (ARICEPT) 10 MG tablet Take 1 tablet by mouth At bedtime.      [name] escitalopram (LEXAPRO) 10 MG tablet Take 10 mg by mouth daily.      . famotidine (PEPCID) 20 MG tablet Take 1 tablet (20 mg total) by mouth 2 (two) times daily.  20 tablet  0  . fentaNYL (DURAGESIC - DOSED MCG/HR) 100 MCG/HR Place 1 patch onto the skin every 3 (three) days.       [name] gabapentin (NEURONTIN) 100 MG capsule Take 100 mg by mouth daily.       . hydrochlorothiazide (,MICROZIDE/HYDRODIURIL,) 12.5 MG capsule Take 12.5 mg by mouth daily.       .  lansoprazole (PREVACID) 30 MG capsule Take 30 mg by mouth daily.       [name] levofloxacin (LEVAQUIN) 500 MG tablet Take 1 tablet by mouth daily.      [name] levothyroxine (SYNTHROID, LEVOTHROID) 25 MCG tablet Take 25 mcg by mouth daily.       [name] lubiprostone (AMITIZA) 24 MCG capsule Take 1 capsule (24 mcg total) by mouth 2 (two) times daily with a meal.  60 capsule  3  . NAMENDA 5 MG tablet Take 5 mg by mouth daily.       . nitroGLYCERIN (NITROSTAT) 0.4 MG SL tablet Place 1 tablet (0.4 mg total) under the tongue every 5 (five) minutes as needed. For chest pains  25 tablet  11  . oxyCODONE (ROXICODONE) 15 MG immediate release tablet Take 15 mg by mouth every 6 (six) hours as needed.       [name] oxymorphone (OPANA) 10 MG tablet Take 10 mg by mouth every 6 (six) hours as needed.       . pravastatin (PRAVACHOL) 40 MG tablet TAKE (1) TABLET BY MOUTH AT BEDTIME.  30 tablet  3   No current facility-administered medications for this visit.    Past Medical History  Diagnosis Date  . Arthritis   .  Coronary atherosclerosis of native coronary artery     Multivessel status post CABG, DES x 2 SVG to PDA 8/12  . Mixed hyperlipidemia   . Essential hypertension, benign   . MI (myocardial infarction)   . Hematuria   . OSA (obstructive sleep apnea)   . Anxiety   . OA (osteoarthritis)   . DDD (degenerative disc disease)     Chronic back pain  . Alzheimer disease   . Enlarged prostate   . Thyroid disease     Past Surgical History  Procedure Laterality Date  . Coronary artery bypass graft  1996    LIMA to LAD, SVG to D2, SVG to PDA, SVG to OM1 and OM2  . Hernia repair    . Coronary angioplasty with stent placement    . Colonoscopy  08/03/2004    Jenkins-numerous large diverticula in the descending, transverse, descending, and sigmoid colon. Otherwise normal exam.  . Colonoscopy  07/12/2012    RMR: External hemorrhoidal tag; multiple rectal and colonic polyps removed and/or treated as described above. Pancolonic  diverticulosis. Bx-tubular adenomas, rectal hyperplastic polyp. next colonoscopy in 06/2015.  [name] Esophagogastroduodenoscopy N/A 03/16/2013    Procedure: ESOPHAGOGASTRODUODENOSCOPY (EGD);  Surgeon: Corbin Ade, MD;  Location: [facility];  Service: Endoscopy;  Laterality: N/A;  12:00-moved to 1030 Leigh Ann notified pt  . Esophagogastroduodenoscopy N/A 03/06/2013    Procedure: ESOPHAGOGASTRODUODENOSCOPY (EGD);  Surgeon: Corbin Ade, MD;  Location: [facility];  Service: Endoscopy;  Laterality: N/A;    Social History Dustin [name] reports that he quit smoking about 18 years ago. His smoking use included Cigarettes. He has a 80 pack-year smoking history. He has never used smokeless tobacco. Dustin [name] reports that he does not drink alcohol.  Review of Systems Outlined above, otherwise negative.  Physical Examination Filed Vitals:   11/05/13 1454  BP: 121/62  Pulse: 51   Filed Weights   11/05/13 1454  Weight: 175 lb (79.379 kg)    Comfortable at rest.  HEENT: Conjunctiva and lids normal, oropharynx clear.  Neck: Supple, no elevated JVP or carotid bruits, no thyromegaly.  Lungs: Clear to auscultation, nonlabored breathing at rest.  Cardiac: Regular rate and rhythm, no S3, soft systolic murmur, no pericardial rub.  Abdomen: Soft, nontender, no guarding or rebound.  Extremities: No pitting edema, distal pulses 2+.  Skin: Warm and dry.  Musculoskeletal: No kyphosis.  Neuropsychiatric: Alert and oriented x3, calm.   Problem List and Plan   Coronary atherosclerosis of native coronary artery Multivessel disease status post CABG with subsequent DES to the vein graft to PDA. Symptomatically stable on medical therapy. Continue observation.  HYPERTENSION, BENIGN Blood pressure is normal today.  HYPERLIPIDEMIA-MIXED He continues on Pravachol. Due to for followup lipids with Dr. [name].    Jonelle Sidle, M.D., F.A.C.C.

## 2013-11-05 NOTE — Assessment & Plan Note (Signed)
He continues on Pravachol. Due to for followup lipids with Dr. [name].

## 2013-11-05 NOTE — Assessment & Plan Note (Signed)
Blood pressure is normal today. 

## 2013-11-05 NOTE — Addendum Note (Signed)
Addended by: Eustace Moore on: 11/05/2013 03:35 PM   Modules accepted: Orders

## 2013-11-05 NOTE — Patient Instructions (Signed)
Your physician recommends that you continue on your current medications as directed. Please refer to the Current Medication list given to you today.  Your physician wants you to follow-up in: 6 months. You will receive a reminder letter in the mail two months in advance. If you don't receive a letter, please call our office to schedule the follow-up appointment.  

## 2013-11-05 NOTE — Assessment & Plan Note (Signed)
Multivessel disease status post CABG with subsequent DES to the vein graft to PDA. Symptomatically stable on medical therapy. Continue observation.

## 2013-12-26 ENCOUNTER — Other Ambulatory Visit: Payer: Self-pay | Admitting: Cardiology

## 2014-03-10 ENCOUNTER — Emergency Department (HOSPITAL_COMMUNITY)
Admission: EM | Admit: 2014-03-10 | Discharge: 2014-03-11 | Disposition: A | Discharge status: Home / Self Care | Payer: Medicare Other | Attending: Emergency Medicine | Admitting: Emergency Medicine

## 2014-03-10 ENCOUNTER — Encounter (HOSPITAL_COMMUNITY): Payer: Self-pay | Admitting: Emergency Medicine

## 2014-03-10 DIAGNOSIS — F0280 Dementia in other diseases classified elsewhere without behavioral disturbance: Secondary | ICD-10-CM | POA: Insufficient documentation

## 2014-03-10 DIAGNOSIS — G8929 Other chronic pain: Secondary | ICD-10-CM | POA: Insufficient documentation

## 2014-03-10 DIAGNOSIS — Z87448 Personal history of other diseases of urinary system: Secondary | ICD-10-CM | POA: Insufficient documentation

## 2014-03-10 DIAGNOSIS — R109 Unspecified abdominal pain: Secondary | ICD-10-CM | POA: Insufficient documentation

## 2014-03-10 DIAGNOSIS — Z951 Presence of aortocoronary bypass graft: Secondary | ICD-10-CM | POA: Insufficient documentation

## 2014-03-10 DIAGNOSIS — Z7902 Long term (current) use of antithrombotics/antiplatelets: Secondary | ICD-10-CM | POA: Insufficient documentation

## 2014-03-10 DIAGNOSIS — I10 Essential (primary) hypertension: Secondary | ICD-10-CM | POA: Insufficient documentation

## 2014-03-10 DIAGNOSIS — R05 Cough: Secondary | ICD-10-CM | POA: Insufficient documentation

## 2014-03-10 DIAGNOSIS — F1110 Opioid abuse, uncomplicated: Secondary | ICD-10-CM | POA: Insufficient documentation

## 2014-03-10 DIAGNOSIS — E782 Mixed hyperlipidemia: Secondary | ICD-10-CM | POA: Insufficient documentation

## 2014-03-10 DIAGNOSIS — I252 Old myocardial infarction: Secondary | ICD-10-CM | POA: Insufficient documentation

## 2014-03-10 DIAGNOSIS — F1310 Sedative, hypnotic or anxiolytic abuse, uncomplicated: Secondary | ICD-10-CM | POA: Insufficient documentation

## 2014-03-10 DIAGNOSIS — R1110 Vomiting, unspecified: Secondary | ICD-10-CM | POA: Insufficient documentation

## 2014-03-10 DIAGNOSIS — R197 Diarrhea, unspecified: Secondary | ICD-10-CM | POA: Insufficient documentation

## 2014-03-10 DIAGNOSIS — Z8739 Personal history of other diseases of the musculoskeletal system and connective tissue: Secondary | ICD-10-CM | POA: Insufficient documentation

## 2014-03-10 DIAGNOSIS — F0390 Unspecified dementia without behavioral disturbance: Secondary | ICD-10-CM

## 2014-03-10 DIAGNOSIS — F411 Generalized anxiety disorder: Secondary | ICD-10-CM | POA: Insufficient documentation

## 2014-03-10 DIAGNOSIS — E079 Disorder of thyroid, unspecified: Secondary | ICD-10-CM | POA: Insufficient documentation

## 2014-03-10 DIAGNOSIS — R059 Cough, unspecified: Secondary | ICD-10-CM | POA: Insufficient documentation

## 2014-03-10 DIAGNOSIS — Z9861 Coronary angioplasty status: Secondary | ICD-10-CM | POA: Insufficient documentation

## 2014-03-10 DIAGNOSIS — I2510 Atherosclerotic heart disease of native coronary artery without angina pectoris: Secondary | ICD-10-CM | POA: Insufficient documentation

## 2014-03-10 DIAGNOSIS — Z79899 Other long term (current) drug therapy: Secondary | ICD-10-CM | POA: Insufficient documentation

## 2014-03-10 DIAGNOSIS — Z8669 Personal history of other diseases of the nervous system and sense organs: Secondary | ICD-10-CM | POA: Insufficient documentation

## 2014-03-10 DIAGNOSIS — Z87891 Personal history of nicotine dependence: Secondary | ICD-10-CM | POA: Insufficient documentation

## 2014-03-10 DIAGNOSIS — G309 Alzheimer's disease, unspecified: Secondary | ICD-10-CM | POA: Insufficient documentation

## 2014-03-10 NOTE — ED Notes (Signed)
Patient c/o urinary retention and burning when urinating and abdominal pain.  Patient states he has been sick for several weeks; family member states he sleeps all the time.

## 2014-03-10 NOTE — ED Notes (Signed)
Patient made comment during triage that he has had thoughts of killing himself.  Explained to family and patient that we will take care of his medical concerns, but he would have to be treated for mental health/SI too.

## 2014-03-11 ENCOUNTER — Emergency Department (HOSPITAL_COMMUNITY): Payer: Medicare Other

## 2014-03-11 LAB — CBC WITH DIFFERENTIAL/PLATELET
BASOS PCT: 0 % (ref 0–1)
Basophils Absolute: 0 10*3/uL (ref 0.0–0.1)
EOS PCT: 2 % (ref 0–5)
Eosinophils Absolute: 0.2 10*3/uL (ref 0.0–0.7)
HEMATOCRIT: 38.8 % — AB (ref 39.0–52.0)
HEMOGLOBIN: 13.3 g/dL (ref 13.0–17.0)
Lymphocytes Relative: 14 % (ref 12–46)
Lymphs Abs: 1.4 10*3/uL (ref 0.7–4.0)
MCH: 32.3 pg (ref 26.0–34.0)
MCHC: 34.3 g/dL (ref 30.0–36.0)
MCV: 94.2 fL (ref 78.0–100.0)
MONO ABS: 0.9 10*3/uL (ref 0.1–1.0)
MONOS PCT: 9 % (ref 3–12)
NEUTROS ABS: 7.4 10*3/uL (ref 1.7–7.7)
Neutrophils Relative %: 74 % (ref 43–77)
Platelets: 176 10*3/uL (ref 150–400)
RBC: 4.12 MIL/uL — ABNORMAL LOW (ref 4.22–5.81)
RDW: 12.4 % (ref 11.5–15.5)
WBC: 10 10*3/uL (ref 4.0–10.5)

## 2014-03-11 LAB — URINALYSIS, ROUTINE W REFLEX MICROSCOPIC
BILIRUBIN URINE: NEGATIVE
Glucose, UA: NEGATIVE mg/dL
Ketones, ur: NEGATIVE mg/dL
LEUKOCYTES UA: NEGATIVE
NITRITE: NEGATIVE
PH: 6 (ref 5.0–8.0)
Protein, ur: NEGATIVE mg/dL
UROBILINOGEN UA: 0.2 mg/dL (ref 0.0–1.0)

## 2014-03-11 LAB — RAPID URINE DRUG SCREEN, HOSP PERFORMED
Amphetamines: NOT DETECTED
BARBITURATES: NOT DETECTED
BENZODIAZEPINES: POSITIVE — AB
COCAINE: NOT DETECTED
OPIATES: POSITIVE — AB
Tetrahydrocannabinol: NOT DETECTED

## 2014-03-11 LAB — URINE MICROSCOPIC-ADD ON

## 2014-03-11 LAB — BASIC METABOLIC PANEL
BUN: 27 mg/dL — AB (ref 6–23)
CALCIUM: 8.7 mg/dL (ref 8.4–10.5)
CHLORIDE: 98 meq/L (ref 96–112)
CO2: 30 meq/L (ref 19–32)
CREATININE: 1.01 mg/dL (ref 0.50–1.35)
GFR calc Af Amer: 81 mL/min — ABNORMAL LOW (ref >90)
GFR calc non Af Amer: 70 mL/min — ABNORMAL LOW (ref >90)
Glucose, Bld: 113 mg/dL — ABNORMAL HIGH (ref 70–99)
Potassium: 3.4 mEq/L — ABNORMAL LOW (ref 3.7–5.3)
Sodium: 139 mEq/L (ref 137–147)

## 2014-03-11 MED ORDER — SODIUM CHLORIDE 0.9 % IV BOLUS (SEPSIS)
500.0000 mL | Freq: Once | INTRAVENOUS | Status: AC
  Administered 2014-03-11: 500 mL via INTRAVENOUS

## 2014-03-11 NOTE — ED Notes (Signed)
Pt. Denies thoughts of harming himself at this time. Pt. States "I do not know why I told the other nurse that.". Pt. Reports that he has had thoughts of harming himself before but does not have a plan. Pt. Reports that the Alzheimer's is causing him to have thoughts of hurting himself. Pt. States that he has abdominal pain that a Dr. Prescribes him oxycodone for. Pt. States "he prescribes me a gracious plenty and sometimes I save them up." Pt. Denies taking more medication that prescribed. He states "I sometimes take 4 pills a day, but I can take up to 8." Pt. Denies thoughts of harming anybody else.

## 2014-03-11 NOTE — BH Assessment (Signed)
Assessment complete. Consulted with Serena Colonel, NP who agrees Pt does not meet criteria for inpatient psychiatric treatment and needs to follow up on an outpatient basis with a physician who can treat his Alzheimer's symptoms. Notified Dr. Shanon Rosser of recommendation and he is in agreement.  Orpah Greek Rosana Hoes, [facility] Triage Specialist

## 2014-03-11 NOTE — ED Notes (Signed)
Spoke with TTS, pt. to have consult in 15 min. Pt. Made aware.

## 2014-03-11 NOTE — ED Provider Notes (Signed)
CSN: [id number]     Arrival date & time 03/10/14  2320 History  This chart was scribed for Wynetta Fines, MD by Jenne Campus, ED Scribe. This patient was seen in room APA16A/APA16A and the patient's care was started at 12:27 AM.   Chief Complaint  Patient presents with  . Urinary Retention    Level 5 Caveat- Alzheimer's Disease  The history is provided by the patient. No language interpreter was used.    HPI Comments: Dustin [name] is a 80 y.o. male who states that the history may not be correct due to his AD presents to the Emergency Department complaining of 6 days of abdominal pain described as "having something in it that isn't supposed to be in it like a sour stomach" with associated 6 days of diarrhea, emesis that started last night and subjective fever noted today. He reports one episode of emesis and 20 episodes of watery diarrhea since the onset, 3 occuring yesterday. Currently the abdominal pain "isn't as sour as it was". He also c/o dysuria and difficulty starting a urine stream that started before the GI sxs gradually maybe 2 to 3 months ago, pt is unsure. Wife also reports a recent cough and pt states that he had "right lung soreness" several days ago that has since resolved. He states that he is still spiting up lots of congestion in the morning.  Pt is requesting to have "all my organs checked out". Pt states that he was seen by an Dealer in [place] for a prostate nodule. Biopsys were normal. There was hematuria that was noted in an UA. He reports that he had a CT scan done that was normal. Wife expresses concern that the patient has been extremely fatigued for the past 6 months, sleeping all day long. Pt states that it's due to his arthritis pain. Pt is an extremely hard historian and needs to be asked several times the same question to stay on track.   Pt made SI in triage. He states that he feels like he is living in a fog and says things he doesn't mean due to the AD  but denies any SI currently.    Past Medical History  Diagnosis Date  . Arthritis   . Coronary atherosclerosis of native coronary artery     Multivessel status post CABG, DES x 2 SVG to PDA 8/12  . Mixed hyperlipidemia   . Essential hypertension, benign   . MI (myocardial infarction)   . Hematuria   . OSA (obstructive sleep apnea)   . Anxiety   . OA (osteoarthritis)   . DDD (degenerative disc disease)     Chronic back pain  . Alzheimer disease   . Enlarged prostate   . Thyroid disease    Past Surgical History  Procedure Laterality Date  . Coronary artery bypass graft  1996    LIMA to LAD, SVG to D2, SVG to PDA, SVG to OM1 and OM2  . Hernia repair    . Coronary angioplasty with stent placement    . Colonoscopy  08/03/2004    Jenkins-numerous large diverticula in the descending, transverse, descending, and sigmoid colon. Otherwise normal exam.  . Colonoscopy  07/12/2012    RMR: External hemorrhoidal tag; multiple rectal and colonic polyps removed and/or treated as described above. Pancolonic diverticulosis. Bx-tubular adenomas, rectal hyperplastic polyp. next colonoscopy in 06/2015.  [name] Esophagogastroduodenoscopy N/A 03/16/2013    Procedure: ESOPHAGOGASTRODUODENOSCOPY (EGD);  Surgeon: Daneil Dolin, MD;  Location: [facility];  Service: Endoscopy;  Laterality: N/A;  12:00-moved to [place] notified pt  . Esophagogastroduodenoscopy N/A 03/06/2013    Procedure: ESOPHAGOGASTRODUODENOSCOPY (EGD);  Surgeon: Daneil Dolin, MD;  Location: [facility];  Service: Endoscopy;  Laterality: N/A;   Family History  Problem Relation Age of Onset  . Heart disease    . Colon cancer Neg Hx    History  Substance Use Topics  . Smoking status: Former Smoker -- 2.00 packs/day for 40 years    Types: Cigarettes    Quit date: 12/27/1994  . Smokeless tobacco: Never Used  . Alcohol Use: No    Review of Systems  Unable to perform ROS: Dementia    Allergies  Review of patient's allergies  indicates no known allergies.  Home Medications   Current Outpatient Rx  Name  Route  Sig  Dispense  Refill  . ALPRAZolam (XANAX) 1 MG tablet   Oral   Take 1 mg by mouth daily as needed. For anxiety          . amLODipine (NORVASC) 10 MG tablet   Oral   Take 1 tablet (10 mg total) by mouth daily.   30 tablet   6   . butalbital-acetaminophen-caffeine (FIORICET, ESGIC) 50-325-40 MG per tablet   Oral   Take 1 tablet by mouth 4 (four) times daily as needed.         . carvedilol (COREG) 3.125 MG tablet   Oral   Take 3.125 mg by mouth every morning.          . clopidogrel (PLAVIX) 75 MG tablet   Oral   Take 75 mg by mouth daily.           [name] donepezil (ARICEPT) 10 MG tablet   Oral   Take 1 tablet by mouth At bedtime.         [name] escitalopram (LEXAPRO) 10 MG tablet   Oral   Take 10 mg by mouth daily.         . famotidine (PEPCID) 20 MG tablet   Oral   Take 1 tablet (20 mg total) by mouth 2 (two) times daily.   20 tablet   0   . fentaNYL (DURAGESIC - DOSED MCG/HR) 100 MCG/HR   Transdermal   Place 1 patch onto the skin every 3 (three) days.          [name] gabapentin (NEURONTIN) 100 MG capsule   Oral   Take 100 mg by mouth daily.          . hydrochlorothiazide (,MICROZIDE/HYDRODIURIL,) 12.5 MG capsule   Oral   Take 12.5 mg by mouth daily.          . lansoprazole (PREVACID) 30 MG capsule   Oral   Take 30 mg by mouth daily.          [name] levothyroxine (SYNTHROID, LEVOTHROID) 25 MCG tablet   Oral   Take 25 mcg by mouth daily.          [name] lubiprostone (AMITIZA) 24 MCG capsule   Oral   Take 1 capsule (24 mcg total) by mouth 2 (two) times daily with a meal.   60 capsule   3   . NAMENDA 5 MG tablet   Oral   Take 5 mg by mouth daily.          . nitroGLYCERIN (NITROSTAT) 0.4 MG SL tablet   Sublingual   Place 1 tablet (0.4 mg total) under the tongue every 5 (five) minutes  as needed. For chest pains   25 tablet   11   . oxyCODONE (ROXICODONE) 15 MG  immediate release tablet   Oral   Take 15 mg by mouth every 6 (six) hours as needed.          . pravastatin (PRAVACHOL) 40 MG tablet      TAKE (1) TABLET BY MOUTH AT BEDTIME.   30 tablet   6    Triage Vitals: BP 136/57  Pulse 66  Temp(Src) 98.5 F (36.9 C) (Oral)  Resp 18  Ht 5\' 10"  (1.778 m)  Wt 185 lb (83.915 kg)  BMI 26.54 kg/m2  SpO2 92%  Physical Exam  Nursing note and vitals reviewed.  General: Well-developed, well-nourished male in no acute distress; appearance consistent with age of record HENT: normocephalic; atraumatic Eyes: pupils equal, round and reactive to light; extraocular muscles intact Neck: supple Heart: regular rate and rhythm; no murmurs, rubs or gallops Lungs: clear to auscultation bilaterally Abdomen: soft; nondistended; nontender; no masses or hepatosplenomegaly; bowel sounds present Rectal: Small nodule to left prostate palpated, prostate is non-tender, brown stool in the rectal vault, chaperone present Extremities: No deformity; full range of motion; pulses normal Neurologic: Awake, alert and oriented; motor function intact in all extremities and symmetric; no facial droop Skin: Warm and dry Psychiatric: Depressed mood with congruent affect; denies SI; circumstantial speech, difficulty staying on topic   ED Course  Procedures (including critical care time)  DIAGNOSTIC STUDIES: Oxygen Saturation is 92% on RA, adequate by my interpretation.    COORDINATION OF CARE: 12:48 AM-Discussed treatment plan which includes UA and drug screen panel with pt at bedside and pt agreed to plan.   MDM   Nursing notes and vitals signs, including pulse oximetry, reviewed.  Summary of this visit's results, reviewed by myself:  Labs:  Results for orders placed during the hospital encounter of 03/10/14 (from the past 24 hour(s))  URINALYSIS, ROUTINE W REFLEX MICROSCOPIC     Status: Abnormal   Collection Time    03/11/14 12:23 AM      Result Value Ref  Range   Color, Urine YELLOW  YELLOW   APPearance CLEAR  CLEAR   Specific Gravity, Urine >1.030 (*) 1.005 - 1.030   pH 6.0  5.0 - 8.0   Glucose, UA NEGATIVE  NEGATIVE mg/dL   Hgb urine dipstick TRACE (*) NEGATIVE   Bilirubin Urine NEGATIVE  NEGATIVE   Ketones, ur NEGATIVE  NEGATIVE mg/dL   Protein, ur NEGATIVE  NEGATIVE mg/dL   Urobilinogen, UA 0.2  0.0 - 1.0 mg/dL   Nitrite NEGATIVE  NEGATIVE   Leukocytes, UA NEGATIVE  NEGATIVE  URINE MICROSCOPIC-ADD ON     Status: Abnormal   Collection Time    03/11/14 12:23 AM      Result Value Ref Range   Squamous Epithelial / LPF RARE  RARE   WBC, UA 0-2  <3 WBC/hpf   RBC / HPF 0-2  <3 RBC/hpf   Bacteria, UA FEW (*) RARE  URINE RAPID DRUG SCREEN (HOSP PERFORMED)     Status: Abnormal   Collection Time    03/11/14 12:35 AM      Result Value Ref Range   Opiates POSITIVE (*) NONE DETECTED   Cocaine NONE DETECTED  NONE DETECTED   Benzodiazepines POSITIVE (*) NONE DETECTED   Amphetamines NONE DETECTED  NONE DETECTED   Tetrahydrocannabinol NONE DETECTED  NONE DETECTED   Barbiturates NONE DETECTED  NONE DETECTED  CBC WITH DIFFERENTIAL  Status: Abnormal   Collection Time    03/11/14  1:05 AM      Result Value Ref Range   WBC 10.0  4.0 - 10.5 K/uL   RBC 4.12 (*) 4.22 - 5.81 MIL/uL   Hemoglobin 13.3  13.0 - 17.0 g/dL   HCT 38.8 (*) 39.0 - 52.0 %   MCV 94.2  78.0 - 100.0 fL   MCH 32.3  26.0 - 34.0 pg   MCHC 34.3  30.0 - 36.0 g/dL   RDW 12.4  11.5 - 15.5 %   Platelets 176  150 - 400 K/uL   Neutrophils Relative % 74  43 - 77 %   Neutro Abs 7.4  1.7 - 7.7 K/uL   Lymphocytes Relative 14  12 - 46 %   Lymphs Abs 1.4  0.7 - 4.0 K/uL   Monocytes Relative 9  3 - 12 %   Monocytes Absolute 0.9  0.1 - 1.0 K/uL   Eosinophils Relative 2  0 - 5 %   Eosinophils Absolute 0.2  0.0 - 0.7 K/uL   Basophils Relative 0  0 - 1 %   Basophils Absolute 0.0  0.0 - 0.1 K/uL  BASIC METABOLIC PANEL     Status: Abnormal   Collection Time    03/11/14  1:05 AM       Result Value Ref Range   Sodium 139  137 - 147 mEq/L   Potassium 3.4 (*) 3.7 - 5.3 mEq/L   Chloride 98  96 - 112 mEq/L   CO2 30  19 - 32 mEq/L   Glucose, Bld 113 (*) 70 - 99 mg/dL   BUN 27 (*) 6 - 23 mg/dL   Creatinine, Ser 1.01  0.50 - 1.35 mg/dL   Calcium 8.7  8.4 - 10.5 mg/dL   GFR calc non Af Amer 70 (*) >90 mL/min   GFR calc Af Amer 81 (*) >90 mL/min    Imaging Studies: Dg Chest 2 View  03/11/2014   CLINICAL DATA:  Shortness of breath and chest pain.  EXAM: CHEST  2 VIEW  COMPARISON:  Single view of the chest 01/22/2013.  FINDINGS: The patient is status post CABG. There is cardiomegaly without edema. No consolidative process, pneumothorax or effusion.  IMPRESSION: Cardiomegaly without acute disease.   Electronically Signed   By: Inge Rise M.D.   On: 03/11/2014 01:31   4:14 AM Patient assessed by TSS. They find no evidence of acute suicidal ideation. They do recommend that his primary care physician be contacted regarding further workup of his dementia.   I personally performed the services described in this documentation, which was scribed in my presence.  The recorded information has been reviewed and considered.      Wynetta Fines, MD 03/11/14 [phone]

## 2014-03-11 NOTE — Discharge Instructions (Signed)
Alzheimer Disease Alzheimer Disease (AD) is a mental disorder. It causes memory loss and loss of other mental functions, such as learning, thinking, solving problems, communicating, and completing tasks. The mental losses interfere with the ability to perform daily activities at work, at home, or in social situations. AD usually starts in the late 60s or early 88s but can start earlier in life (familial form). The mental changes caused by AD are permanent and worsen over time. As the illness progresses, the ability to do even the simplest things is lost. Survival with AD ranges from several years to as long as 20 years. CAUSES AD is caused by abnormally high levels of a protein (beta-amyloid) in the brain. This protein forms very small deposits within and around the brain's nerve cells. These deposits prevent the nerve cells from working properly. Experts are not certain what causes the beta-amyloid deposits in AD. RISK FACTORS The following major risk factors have been identified:  Increasing age.  Certain genetic variations, such as Down syndrome (trisomy 21). SYMPTOMS The earliest mental change in AD is mild memory loss of recent events, names, or phone numbers. Other symptoms at the beginning of AD include loss of objects, minor loss of vocabulary, and difficulty with complex tasks, such as paying bills or driving in unfamiliar locations. At this stage, you are still able to perform daily activities but need greater effort, more time, or memory aids. Other mental functions deteriorate as AD worsens. These changes slowly go from mild to severe. Symptoms at this stage include:  Difficulty remembering You may not be able to recall personal information such as your address and telephone number. You may become confused about the date, the season of the year, or your location.  Difficulty maintaining attention You may forget what you wanted to say during conversations and repeat what you have already  said.  Difficulty learning new information or tasks You may not remember what you read or the name of a new friend you met.  Difficulty counting or doing math You may have difficulty with complex math problems. You may make mistakes in paying bills or managing your checkbook.  Poor reasoning and judgment You may make poor decisions or not dress right for the weather.  Difficulty communicating You may have regular difficulty remembering words, naming objects, expressing yourself clearly, or writing sentences that make sense.  Difficulty performing familiar daily activities You may get lost driving in familiar locations or need help eating, bathing, dressing, grooming, or using the toilet. You may have difficulty maintaining bladder or bowel control.  Difficulty recognizing familiar faces You may confuse family members or close friends with one another. You may not recognize a close relative or may mistake strangers for family. AD also may cause changes in personality and behavior. These changes include loss of interest or motivation, social withdrawal, anxiety, difficulty sleeping, uncharacteristic anger or combativeness, a false belief that someone is trying to harm you (paranoia), seeing things that are not real (hallucinations), or agitation. Confusion and disruptive behavior are often worse at night and may be triggered by changes in the environment or acute medical issues. DIAGNOSIS  AD is diagnosed through an assessment by your health care provider. During this assessment, your health care provider will do the following:  Ask you and your family, friends, or caregiver questions about your symptoms, their frequency, their duration and progression, and the effect they are having on your life.  Ask questions about your personal and family medical history and use  of alcohol or drugs, including prescription medicine.  Perform a physical exam and order blood tests and brain imaging exams. Your  health care provider may refer you to a specialist for detailed evaluation of your mental functions (neuropsychological testing).  Many different brain disorders, medical conditions, and certain substances can cause symptoms that resemble AD symptoms. These must be ruled out before AD can be diagnosed. If AD is diagnosed, it will be considered either "possible" or "probable" AD. "Possible" AD means that your symptoms are typical of AD and no other disorder is causing them. "Probable" AD means that you also have a family history of AD or genetic test results that support the diagnosis. Certain tests, mostly used in research studies, are highly specific for AD.  TREATMENT  There is currently no cure for AD. The goals of treatment are to:  Slow down the progression of the disease.  Preserve mental function as long as possible.  Manage behavioral symptoms.  Make life easier for the person with AD and their caregivers. The following treatment options are available:  Medicine Certain medicines may help slow memory loss by changing the level of certain chemicals in the brain. Medicine may also help with behavioral symptoms.  Talk therapy Talk therapy provides education, support, and memory aids for people with AD. It is most effective in the early stages of the illness.  Caregiving Caregivers may be family members, friends, or trained medical professionals. They help the person with AD with daily life activities. Caregiving may take place at home or at a nursing facility.  Family support groups These provide education, emotional support, and information about community resources to family members who are taking care of the person with AD. Document Released: 08/24/2004 Document Revised: 08/15/2013 Document Reviewed: 04/20/2013 [employer] Patient Information 2014 [employer].  

## 2014-03-11 NOTE — BH Assessment (Signed)
Tele Assessment Note   Dustin [name] is an 80 y.o. male, married, Caucasian who presents to [facility] ED accompanied by his wife, Juris Gosnell [phone]. Pt states his wife insisted he come to the ED because he thinks he may have a stomach virus and he states he has had diarrhea, vomiting and stomach discomfort for three days. Pt states his mood has been good lately. He states he sometimes feels anxious and will take "half a Xanax" which he finds very effective. When it was explained to Pt that the ED staff was concerned he had said in triage that he was having thoughts of wanting to harm himself he says he thought the question was "have you ever had thoughts of wanting to kill yourself". Pt says that he has a hearing loss and that he didn't really hear the question. He says "in 19 seven years I have had the thought that I wanted to kill myself but I never gave it a second thought." Pt insists he has had no recent suicidal thoughts and he has never acted on suicidal thoughts in the past. Pt denies any family history of suicide. Pt denies homicidal ideation or history of violence. Pt denies any auditory or visual hallucinations. Pt denies any history of alcohol or substance use. Pt states that one of his sons has a substance abuse problem.  Pt was diagnosed with Alzheimer's approximately five years ago. [name] says she is concerned that Pt's symptoms are getting worse. She reports that both of their sons and their granddaughter have drug problems and that she believes Pt is more depressed about this then he wants to discuss. She does not believe he is having suicidal ideation and has no concerns that he may harm himself. She says he has not seen a physician for his Alzheimer's in years and she thinks he needs to see a specialist.   It is well groomed, alert, oriented x4 with normal speech and normal motor behavior. His eye contact is good. Pt has a hearing loss and asked for questions to be  repeated several times. His thought process is coherent and relevant. His mood was euthymic and affect congruent with mood. He does not appear to be responding to internal stimuli or appear to be experiencing delusional thought content. Pt was calm, cooperative and pleasant throughout assessment.     Axis I: Anxiety Disorder NOS Axis II: Deferred Axis III:  Past Medical History  Diagnosis Date  . Arthritis   . Coronary atherosclerosis of native coronary artery     Multivessel status post CABG, DES x 2 SVG to PDA 8/12  . Mixed hyperlipidemia   . Essential hypertension, benign   . MI (myocardial infarction)   . Hematuria   . OSA (obstructive sleep apnea)   . Anxiety   . OA (osteoarthritis)   . DDD (degenerative disc disease)     Chronic back pain  . Alzheimer disease   . Enlarged prostate   . Thyroid disease    Axis IV: problems with primary support group Axis V: GAF=55  Past Medical History:  Past Medical History  Diagnosis Date  . Arthritis   . Coronary atherosclerosis of native coronary artery     Multivessel status post CABG, DES x 2 SVG to PDA 8/12  . Mixed hyperlipidemia   . Essential hypertension, benign   . MI (myocardial infarction)   . Hematuria   . OSA (obstructive sleep apnea)   . Anxiety   . OA (  osteoarthritis)   . DDD (degenerative disc disease)     Chronic back pain  . Alzheimer disease   . Enlarged prostate   . Thyroid disease     Past Surgical History  Procedure Laterality Date  . Coronary artery bypass graft  1996    LIMA to LAD, SVG to D2, SVG to PDA, SVG to OM1 and OM2  . Hernia repair    . Coronary angioplasty with stent placement    . Colonoscopy  08/03/2004    Jenkins-numerous large diverticula in the descending, transverse, descending, and sigmoid colon. Otherwise normal exam.  . Colonoscopy  07/12/2012    RMR: External hemorrhoidal tag; multiple rectal and colonic polyps removed and/or treated as described above. Pancolonic diverticulosis.  Bx-tubular adenomas, rectal hyperplastic polyp. next colonoscopy in 06/2015.  [name] Esophagogastroduodenoscopy N/A 03/16/2013    Procedure: ESOPHAGOGASTRODUODENOSCOPY (EGD);  Surgeon: Daneil Dolin, MD;  Location: [facility];  Service: Endoscopy;  Laterality: N/A;  12:00-moved to [place] notified pt  . Esophagogastroduodenoscopy N/A 03/06/2013    Procedure: ESOPHAGOGASTRODUODENOSCOPY (EGD);  Surgeon: Daneil Dolin, MD;  Location: [facility];  Service: Endoscopy;  Laterality: N/A;    Family History:  Family History  Problem Relation Age of Onset  . Heart disease    . Colon cancer Neg Hx     Social History:  reports that he quit smoking about 19 years ago. His smoking use included Cigarettes. He has a 80 pack-year smoking history. He has never used smokeless tobacco. He reports that he does not drink alcohol or use illicit drugs.  Additional Social History:  Alcohol / Drug Use Pain Medications: Denies abuse Prescriptions: Denies abuse Over the Counter: Denies abuse History of alcohol / drug use?: No history of alcohol / drug abuse Longest period of sobriety (when/how long): NA  CIWA: CIWA-Ar BP: 109/53 mmHg Pulse Rate: 55 COWS:    Allergies: No Known Allergies  Home Medications:  (Not in a hospital admission)  OB/GYN Status:  No LMP for male patient.  General Assessment Data Location of Assessment: [facility] ED Is this a Tele or Face-to-Face Assessment?: Tele Assessment Is this an Initial Assessment or a Re-assessment for this encounter?: Initial Assessment Living Arrangements: Spouse/significant other Can pt return to current living arrangement?: Yes Admission Status: Voluntary Is patient capable of signing voluntary admission?: Yes Transfer from: Home Referral Source: Self/Family/Friend     [place] Living Arrangements: Spouse/significant other Name of Psychiatrist: None Name of Therapist: None  Education Status Is patient currently in school?:  No Current Grade: NA Highest grade of school patient has completed: NA Name of school: NA Contact person: NA  Risk to self Suicidal Ideation: No Suicidal Intent: No Is patient at risk for suicide?: No Suicidal Plan?: No Access to Means: No What has been your use of drugs/alcohol within the last 12 months?: Pt denies Previous Attempts/Gestures: No How many times?: 0 Other Self Harm Risks: None Triggers for Past Attempts: None known Intentional Self Injurious Behavior: None Family Suicide History: No Recent stressful life event(s): Other (Comment) (Children and graddaughter addicted to drugs) Persecutory voices/beliefs?: No Depression: No Depression Symptoms:  (Pt denies depressive symptoms) Substance abuse history and/or treatment for substance abuse?: No Suicide prevention information given to non-admitted patients: Yes  Risk to Others Homicidal Ideation: No Thoughts of Harm to Others: No Current Homicidal Intent: No Current Homicidal Plan: No Access to Homicidal Means: No Identified Victim: None History of harm to others?: No Assessment of  Violence: None Noted Violent Behavior Description: None Does patient have access to weapons?: Yes (Comment) (Pt owns a shot gun) Criminal Charges Pending?: No Does patient have a court date: No  Psychosis Hallucinations: None noted Delusions: None noted  Mental Status Report Appear/Hygiene: Other (Comment) (Well groomed) Eye Contact: Good Motor Activity: Unremarkable Speech: Logical/coherent Level of Consciousness: Alert Mood: Other (Comment) (Euthymic) Affect: Appropriate to circumstance Anxiety Level: None Thought Processes: Coherent;Relevant Judgement: Unimpaired Orientation: Person;Place;Time;Situation;Appropriate for developmental age Obsessive Compulsive Thoughts/Behaviors: None  Cognitive Functioning Concentration: Decreased Memory: Recent Intact;Remote Intact IQ: Average Insight: Fair Impulse Control:  Good Appetite: Good Weight Loss: 0 Weight Gain: 15 Sleep: No Change Total Hours of Sleep: 8 Vegetative Symptoms: None  ADLScreening [facility] Assessment Services) Patient's cognitive ability adequate to safely complete daily activities?: Yes Patient able to express need for assistance with ADLs?: Yes Independently performs ADLs?: Yes (appropriate for developmental age)  Prior Inpatient Therapy Prior Inpatient Therapy: No Prior Therapy Dates: NA Prior Therapy Facilty/Provider(s): NA Reason for Treatment: NA  Prior Outpatient Therapy Prior Outpatient Therapy: Yes Prior Therapy Dates: Ongoing Prior Therapy Facilty/Provider(s): PCP Reason for Treatment: Anxiety, depression  ADL Screening (condition at time of admission) Patient's cognitive ability adequate to safely complete daily activities?: Yes Is the patient deaf or have difficulty hearing?: Yes Does the patient have difficulty seeing, even when wearing glasses/contacts?: No Does the patient have difficulty concentrating, remembering, or making decisions?: No Patient able to express need for assistance with ADLs?: Yes Does the patient have difficulty dressing or bathing?: No Independently performs ADLs?: Yes (appropriate for developmental age) Does the patient have difficulty walking or climbing stairs?: No Weakness of Legs: None Weakness of Arms/Hands: None       Abuse/Neglect Assessment (Assessment to be complete while patient is alone) Physical Abuse: Denies Verbal Abuse: Denies Sexual Abuse: Denies Exploitation of patient/patient's resources: Denies Self-Neglect: Denies Values / Beliefs Cultural Requests During Hospitalization: None Spiritual Requests During Hospitalization: None   Advance Directives (For Healthcare) Advance Directive: Patient does not have advance directive;Patient would not like information Pre-existing out of facility DNR order (yellow form or pink MOST form): No Nutrition Screen- [facility]  Adult/[facility] Patient's home diet: Regular  Additional Information 1:1 In Past 12 Months?: No CIRT Risk: No Elopement Risk: No Does patient have medical clearance?: Yes     Disposition: Consulted with Serena Colonel, NP who agrees Pt does not meet criteria for inpatient psychiatric treatment and needs to follow up on an outpatient basis with a physician who can treat his Alzheimer's symptoms. Notified Dr. Shanon Rosser of recommendation and he is in agreement.  Disposition Initial Assessment Completed for this Encounter: Yes Disposition of Patient: Referred to Patient referred to: Other (Comment) (MD who can address symptoms of Alzheimer's)  Anson Fret, Orpah Greek 03/11/2014 4:28 AM

## 2014-03-11 NOTE — BH Assessment (Signed)
Received call for assessment. Spoke with Shanon Rosser, MD who said Pt presented with medical complaints but does not appear to be physically ill. He appears depressed and told staff in triage he was having suicidal thoughts. Tele-assessment will be initiated.  Orpah Greek Rosana Hoes, [facility] Triage Specialist

## 2014-05-10 ENCOUNTER — Ambulatory Visit: Payer: Medicare Other | Admitting: Cardiology

## 2014-05-21 ENCOUNTER — Encounter: Payer: Self-pay | Admitting: Cardiology

## 2014-05-21 ENCOUNTER — Ambulatory Visit (INDEPENDENT_AMBULATORY_CARE_PROVIDER_SITE_OTHER): Payer: Medicare Other | Admitting: Cardiology

## 2014-05-21 VITALS — BP 133/68 | HR 53 | Ht 70.0 in | Wt 181.0 lb

## 2014-05-21 DIAGNOSIS — I10 Essential (primary) hypertension: Secondary | ICD-10-CM

## 2014-05-21 DIAGNOSIS — E785 Hyperlipidemia, unspecified: Secondary | ICD-10-CM

## 2014-05-21 DIAGNOSIS — I2510 Atherosclerotic heart disease of native coronary artery without angina pectoris: Secondary | ICD-10-CM

## 2014-05-21 NOTE — Patient Instructions (Signed)
Your physician wants you to follow-up in: 6 months You will receive a reminder letter in the mail two months in advance. If you don't receive a letter, please call our office to schedule the follow-up appointment.     Your physician recommends that you continue on your current medications as directed. Please refer to the Current Medication list given to you today.      Thank you for choosing [facility] !        

## 2014-05-21 NOTE — Assessment & Plan Note (Signed)
Stable angina symptoms on medical therapy. No changes made today. Continue observation.

## 2014-05-21 NOTE — Assessment & Plan Note (Signed)
He continues on Pravachol, followed by Dr. Gerarda Fraction.

## 2014-05-21 NOTE — Progress Notes (Signed)
Clinical Summary [name] is a 80 y.o.male last seen in November 2014. He is here with his wife today. Reports stable angina symptoms, mainly when he gets emotionally upset, resolves with nitroglycerin. I reviewed his recent records, he has had no cardiac-related hospitalizations. We discussed his medications today.  Lab work from March showed hemoglobin 13.3, platelets 176, potassium 3.4, BUN 27, creatinine 1.0.  Cardiac catheterization in August 2012 demonstrated patent LIMA to LAD, patent SVG to diagonal, patent SVG to OM1 and OM 2, and diseased SVG to the PDA treated with DES x 2 as well as more distal disease that was managed medically. LVEF was in the range of 50-55% at that time. We continue medical therapy and observation.   No Known Allergies  Current Outpatient Prescriptions  Medication Sig Dispense Refill  . ALPRAZolam (XANAX) 1 MG tablet Take 1 mg by mouth daily as needed. For anxiety       . amLODipine (NORVASC) 10 MG tablet Take 1 tablet (10 mg total) by mouth daily.  30 tablet  6  . carvedilol (COREG) 3.125 MG tablet Take 3.125 mg by mouth every morning.       . clopidogrel (PLAVIX) 75 MG tablet Take 75 mg by mouth daily.        [name] donepezil (ARICEPT) 10 MG tablet Take 1 tablet by mouth At bedtime.      [name] escitalopram (LEXAPRO) 10 MG tablet Take 10 mg by mouth daily.      . famotidine (PEPCID) 20 MG tablet Take 1 tablet (20 mg total) by mouth 2 (two) times daily.  20 tablet  0  . fentaNYL (DURAGESIC - DOSED MCG/HR) 100 MCG/HR Place 1 patch onto the skin every 3 (three) days.       [name] gabapentin (NEURONTIN) 100 MG capsule Take 100 mg by mouth daily.       . hydrochlorothiazide (,MICROZIDE/HYDRODIURIL,) 12.5 MG capsule Take 12.5 mg by mouth daily.       . lansoprazole (PREVACID) 30 MG capsule Take 30 mg by mouth daily.       [name] levothyroxine (SYNTHROID, LEVOTHROID) 25 MCG tablet Take 25 mcg by mouth daily.       [name] NAMENDA 5 MG tablet Take 5 mg by mouth daily.       .  nitroGLYCERIN (NITROSTAT) 0.4 MG SL tablet Place 1 tablet (0.4 mg total) under the tongue every 5 (five) minutes as needed. For chest pains  25 tablet  11  . oxyCODONE (ROXICODONE) 15 MG immediate release tablet Take 15 mg by mouth every 6 (six) hours as needed.       . pravastatin (PRAVACHOL) 40 MG tablet TAKE (1) TABLET BY MOUTH AT BEDTIME.  30 tablet  6   No current facility-administered medications for this visit.    Past Medical History  Diagnosis Date  . Arthritis   . Coronary atherosclerosis of native coronary artery     Multivessel status post CABG, DES x 2 SVG to PDA 8/12  . Mixed hyperlipidemia   . Essential hypertension, benign   . MI (myocardial infarction)   . Hematuria   . OSA (obstructive sleep apnea)   . Anxiety   . OA (osteoarthritis)   . DDD (degenerative disc disease)     Chronic back pain  . Alzheimer disease   . Enlarged prostate   . Hypothyroidism     Past Surgical History  Procedure Laterality Date  . Coronary artery bypass graft  1996  LIMA to LAD, SVG to D2, SVG to PDA, SVG to OM1 and OM2  . Hernia repair    . Coronary angioplasty with stent placement    . Colonoscopy  08/03/2004    Jenkins-numerous large diverticula in the descending, transverse, descending, and sigmoid colon. Otherwise normal exam.  . Colonoscopy  07/12/2012    RMR: External hemorrhoidal tag; multiple rectal and colonic polyps removed and/or treated as described above. Pancolonic diverticulosis. Bx-tubular adenomas, rectal hyperplastic polyp. next colonoscopy in 06/2015.  [name] Esophagogastroduodenoscopy N/A 03/16/2013    Procedure: ESOPHAGOGASTRODUODENOSCOPY (EGD);  Surgeon: Daneil Dolin, MD;  Location: [facility];  Service: Endoscopy;  Laterality: N/A;  12:00-moved to [place] notified pt  . Esophagogastroduodenoscopy N/A 03/06/2013    Procedure: ESOPHAGOGASTRODUODENOSCOPY (EGD);  Surgeon: Daneil Dolin, MD;  Location: [facility];  Service: Endoscopy;  Laterality: N/A;     Social History Mr. Lowe reports that he quit smoking about 19 years ago. His smoking use included Cigarettes. He has a 80 pack-year smoking history. He has never used smokeless tobacco. Mr. Sipp reports that he does not drink alcohol.  Review of Systems He reports chronic problems with arthritic pain and neuropathy. Fairly inactive at baseline. Also states that he "worries" alot about family problems. Otherwise as outlined.  Physical Examination Filed Vitals:   05/21/14 1339  BP: 133/68  Pulse: 53   Filed Weights   05/21/14 1339  Weight: 181 lb (82.101 kg)    Comfortable at rest.  HEENT: Conjunctiva and lids normal, oropharynx clear.  Neck: Supple, no elevated JVP or carotid bruits, no thyromegaly.  Lungs: Clear to auscultation, nonlabored breathing at rest.  Cardiac: Regular rate and rhythm, no S3, soft systolic murmur, no pericardial rub.  Abdomen: Soft, nontender, no guarding or rebound.  Extremities: No pitting edema, distal pulses 2+.  Skin: Warm and dry.  Musculoskeletal: No kyphosis.  Neuropsychiatric: Alert and oriented x3, calm.   Problem List and Plan   Coronary atherosclerosis of native coronary artery Stable angina symptoms on medical therapy. No changes made today. Continue observation.  HYPERTENSION, BENIGN Keep followup with Dr. Sula Rumple He continues on Pravachol, followed by Dr. Gerarda Fraction.    Satira Sark, M.D., F.A.C.C.

## 2014-05-21 NOTE — Assessment & Plan Note (Signed)
Keep followup with Dr. Fusco. 

## 2014-06-27 ENCOUNTER — Emergency Department (HOSPITAL_COMMUNITY): Payer: Medicare Other

## 2014-06-27 ENCOUNTER — Inpatient Hospital Stay (HOSPITAL_COMMUNITY)
Admission: EM | Admit: 2014-06-27 | Discharge: 2014-06-29 | DRG: 069 | Disposition: A | Discharge status: Home / Self Care | Payer: Medicare Other | Attending: Internal Medicine | Admitting: Internal Medicine

## 2014-06-27 ENCOUNTER — Telehealth: Payer: Self-pay

## 2014-06-27 ENCOUNTER — Encounter (HOSPITAL_COMMUNITY): Payer: Self-pay | Admitting: Emergency Medicine

## 2014-06-27 DIAGNOSIS — M1990 Unspecified osteoarthritis, unspecified site: Secondary | ICD-10-CM | POA: Diagnosis present

## 2014-06-27 DIAGNOSIS — R079 Chest pain, unspecified: Secondary | ICD-10-CM | POA: Diagnosis present

## 2014-06-27 DIAGNOSIS — Z951 Presence of aortocoronary bypass graft: Secondary | ICD-10-CM

## 2014-06-27 DIAGNOSIS — G309 Alzheimer's disease, unspecified: Secondary | ICD-10-CM | POA: Diagnosis present

## 2014-06-27 DIAGNOSIS — F411 Generalized anxiety disorder: Secondary | ICD-10-CM | POA: Diagnosis present

## 2014-06-27 DIAGNOSIS — E039 Hypothyroidism, unspecified: Secondary | ICD-10-CM | POA: Diagnosis present

## 2014-06-27 DIAGNOSIS — G8929 Other chronic pain: Secondary | ICD-10-CM | POA: Diagnosis present

## 2014-06-27 DIAGNOSIS — I252 Old myocardial infarction: Secondary | ICD-10-CM

## 2014-06-27 DIAGNOSIS — E782 Mixed hyperlipidemia: Secondary | ICD-10-CM | POA: Diagnosis present

## 2014-06-27 DIAGNOSIS — R072 Precordial pain: Secondary | ICD-10-CM

## 2014-06-27 DIAGNOSIS — I498 Other specified cardiac arrhythmias: Secondary | ICD-10-CM | POA: Diagnosis present

## 2014-06-27 DIAGNOSIS — I25119 Atherosclerotic heart disease of native coronary artery with unspecified angina pectoris: Secondary | ICD-10-CM

## 2014-06-27 DIAGNOSIS — I25118 Atherosclerotic heart disease of native coronary artery with other forms of angina pectoris: Secondary | ICD-10-CM

## 2014-06-27 DIAGNOSIS — IMO0002 Reserved for concepts with insufficient information to code with codable children: Secondary | ICD-10-CM | POA: Diagnosis present

## 2014-06-27 DIAGNOSIS — F0280 Dementia in other diseases classified elsewhere without behavioral disturbance: Secondary | ICD-10-CM | POA: Diagnosis present

## 2014-06-27 DIAGNOSIS — I209 Angina pectoris, unspecified: Secondary | ICD-10-CM

## 2014-06-27 DIAGNOSIS — Z7902 Long term (current) use of antithrombotics/antiplatelets: Secondary | ICD-10-CM

## 2014-06-27 DIAGNOSIS — G459 Transient cerebral ischemic attack, unspecified: Principal | ICD-10-CM | POA: Diagnosis present

## 2014-06-27 DIAGNOSIS — G4733 Obstructive sleep apnea (adult) (pediatric): Secondary | ICD-10-CM | POA: Diagnosis present

## 2014-06-27 DIAGNOSIS — Z9861 Coronary angioplasty status: Secondary | ICD-10-CM

## 2014-06-27 DIAGNOSIS — Z87891 Personal history of nicotine dependence: Secondary | ICD-10-CM

## 2014-06-27 DIAGNOSIS — I2510 Atherosclerotic heart disease of native coronary artery without angina pectoris: Secondary | ICD-10-CM | POA: Diagnosis present

## 2014-06-27 DIAGNOSIS — I200 Unstable angina: Secondary | ICD-10-CM

## 2014-06-27 DIAGNOSIS — R531 Weakness: Secondary | ICD-10-CM | POA: Diagnosis present

## 2014-06-27 DIAGNOSIS — I10 Essential (primary) hypertension: Secondary | ICD-10-CM | POA: Diagnosis present

## 2014-06-27 DIAGNOSIS — E785 Hyperlipidemia, unspecified: Secondary | ICD-10-CM | POA: Diagnosis present

## 2014-06-27 HISTORY — DX: Chronic atrophic gastritis without bleeding: K29.40

## 2014-06-27 LAB — CBC WITH DIFFERENTIAL/PLATELET
Basophils Absolute: 0 10*3/uL (ref 0.0–0.1)
Basophils Relative: 0 % (ref 0–1)
Eosinophils Absolute: 0.1 10*3/uL (ref 0.0–0.7)
Eosinophils Relative: 1 % (ref 0–5)
HEMATOCRIT: 41.8 % (ref 39.0–52.0)
HEMOGLOBIN: 14 g/dL (ref 13.0–17.0)
LYMPHS ABS: 1.6 10*3/uL (ref 0.7–4.0)
LYMPHS PCT: 22 % (ref 12–46)
MCH: 32 pg (ref 26.0–34.0)
MCHC: 33.5 g/dL (ref 30.0–36.0)
MCV: 95.4 fL (ref 78.0–100.0)
MONO ABS: 0.5 10*3/uL (ref 0.1–1.0)
MONOS PCT: 7 % (ref 3–12)
NEUTROS ABS: 5 10*3/uL (ref 1.7–7.7)
Neutrophils Relative %: 70 % (ref 43–77)
Platelets: 220 10*3/uL (ref 150–400)
RBC: 4.38 MIL/uL (ref 4.22–5.81)
RDW: 13.4 % (ref 11.5–15.5)
WBC: 7.1 10*3/uL (ref 4.0–10.5)

## 2014-06-27 LAB — BASIC METABOLIC PANEL
ANION GAP: 11 (ref 5–15)
BUN: 14 mg/dL (ref 6–23)
CHLORIDE: 99 meq/L (ref 96–112)
CO2: 30 meq/L (ref 19–32)
CREATININE: 0.74 mg/dL (ref 0.50–1.35)
Calcium: 9.3 mg/dL (ref 8.4–10.5)
GFR calc Af Amer: >90 mL/min (ref >90)
GFR calc non Af Amer: 87 mL/min — ABNORMAL LOW (ref >90)
GLUCOSE: 148 mg/dL — AB (ref 70–99)
Potassium: 4 mEq/L (ref 3.7–5.3)
Sodium: 140 mEq/L (ref 137–147)

## 2014-06-27 LAB — TROPONIN I
Troponin I: <0.3 ng/mL (ref <0.30)
Troponin I: <0.3 ng/mL (ref <0.30)
Troponin I: <0.3 ng/mL (ref <0.30)

## 2014-06-27 MED ORDER — SIMVASTATIN 20 MG PO TABS
20.0000 mg | ORAL_TABLET | Freq: Every day | ORAL | Status: DC
  Administered 2014-06-27 – 2014-06-28 (×2): 20 mg via ORAL
  Filled 2014-06-27 (×3): qty 1

## 2014-06-27 MED ORDER — AMLODIPINE BESYLATE 10 MG PO TABS
10.0000 mg | ORAL_TABLET | Freq: Every day | ORAL | Status: DC
  Administered 2014-06-27 – 2014-06-29 (×3): 10 mg via ORAL
  Filled 2014-06-27 (×2): qty 2
  Filled 2014-06-27: qty 1

## 2014-06-27 MED ORDER — NITROGLYCERIN 2 % TD OINT
1.0000 [in_us] | TOPICAL_OINTMENT | Freq: Four times a day (QID) | TRANSDERMAL | Status: DC
  Administered 2014-06-27 – 2014-06-29 (×7): 1 [in_us] via TOPICAL
  Filled 2014-06-27 (×4): qty 1

## 2014-06-27 MED ORDER — OXYCODONE HCL 5 MG PO TABS: 15.0000 mg | ORAL_TABLET | Freq: Four times a day (QID) | ORAL | Status: DC | PRN

## 2014-06-27 MED ORDER — ASPIRIN 81 MG PO CHEW
324.0000 mg | CHEWABLE_TABLET | Freq: Once | ORAL | Status: AC
  Administered 2014-06-27: 324 mg via ORAL
  Filled 2014-06-27: qty 4

## 2014-06-27 MED ORDER — FENTANYL 75 MCG/HR TD PT72
100.0000 ug | MEDICATED_PATCH | TRANSDERMAL | Status: DC
  Administered 2014-06-27: 100 ug via TRANSDERMAL
  Filled 2014-06-27: qty 1

## 2014-06-27 MED ORDER — MORPHINE SULFATE 2 MG/ML IJ SOLN: 2.0000 mg | INTRAMUSCULAR | Status: DC | PRN

## 2014-06-27 MED ORDER — CARVEDILOL 3.125 MG PO TABS
3.1250 mg | ORAL_TABLET | Freq: Every day | ORAL | Status: DC
  Administered 2014-06-28 – 2014-06-29 (×2): 3.125 mg via ORAL
  Filled 2014-06-27 (×3): qty 1

## 2014-06-27 MED ORDER — HEPARIN SODIUM (PORCINE) 5000 UNIT/ML IJ SOLN
5000.0000 [IU] | Freq: Three times a day (TID) | INTRAMUSCULAR | Status: DC
  Administered 2014-06-27 – 2014-06-29 (×5): 5000 [IU] via SUBCUTANEOUS
  Filled 2014-06-27 (×7): qty 1

## 2014-06-27 MED ORDER — CLOPIDOGREL BISULFATE 75 MG PO TABS
75.0000 mg | ORAL_TABLET | Freq: Every day | ORAL | Status: DC
  Administered 2014-06-28 – 2014-06-29 (×2): 75 mg via ORAL
  Filled 2014-06-27 (×2): qty 1

## 2014-06-27 MED ORDER — ACETAMINOPHEN 325 MG PO TABS: 650.0000 mg | ORAL_TABLET | ORAL | Status: DC | PRN

## 2014-06-27 MED ORDER — NITROGLYCERIN 0.4 MG SL SUBL: 0.4000 mg | SUBLINGUAL_TABLET | SUBLINGUAL | Status: DC | PRN

## 2014-06-27 MED ORDER — GI COCKTAIL ~~LOC~~
30.0000 mL | Freq: Four times a day (QID) | ORAL | Status: DC | PRN
Start: 2014-06-27 — End: 2014-06-29

## 2014-06-27 MED ORDER — MEMANTINE HCL 5 MG PO TABS
5.0000 mg | ORAL_TABLET | Freq: Every day | ORAL | Status: DC
  Administered 2014-06-27 – 2014-06-29 (×3): 5 mg via ORAL
  Filled 2014-06-27 (×3): qty 1

## 2014-06-27 MED ORDER — ESCITALOPRAM OXALATE 10 MG PO TABS
10.0000 mg | ORAL_TABLET | Freq: Every day | ORAL | Status: DC
  Administered 2014-06-28 – 2014-06-29 (×2): 10 mg via ORAL
  Filled 2014-06-27 (×2): qty 1

## 2014-06-27 MED ORDER — BIOTENE DRY MOUTH MT LIQD
15.0000 mL | Freq: Two times a day (BID) | OROMUCOSAL | Status: DC
  Administered 2014-06-27 – 2014-06-29 (×4): 15 mL via OROMUCOSAL

## 2014-06-27 MED ORDER — ALPRAZOLAM 0.5 MG PO TABS
1.0000 mg | ORAL_TABLET | Freq: Every day | ORAL | Status: DC | PRN
  Filled 2014-06-27: qty 2

## 2014-06-27 MED ORDER — HYDROCHLOROTHIAZIDE 12.5 MG PO CAPS
12.5000 mg | ORAL_CAPSULE | Freq: Every day | ORAL | Status: DC
  Administered 2014-06-27 – 2014-06-29 (×3): 12.5 mg via ORAL
  Filled 2014-06-27 (×3): qty 1

## 2014-06-27 MED ORDER — LORAZEPAM 1 MG PO TABS
1.0000 mg | ORAL_TABLET | Freq: Once | ORAL | Status: AC
  Administered 2014-06-27: 1 mg via ORAL
  Filled 2014-06-27: qty 1

## 2014-06-27 MED ORDER — NITROGLYCERIN 2 % TD OINT
1.0000 [in_us] | TOPICAL_OINTMENT | Freq: Four times a day (QID) | TRANSDERMAL | Status: DC
  Administered 2014-06-27: 1 [in_us] via TOPICAL
  Filled 2014-06-27: qty 1

## 2014-06-27 MED ORDER — ONDANSETRON HCL 4 MG/2ML IJ SOLN: 4.0000 mg | Freq: Four times a day (QID) | INTRAMUSCULAR | Status: DC | PRN

## 2014-06-27 MED ORDER — PANTOPRAZOLE SODIUM 40 MG PO TBEC
40.0000 mg | DELAYED_RELEASE_TABLET | Freq: Every day | ORAL | Status: DC
  Administered 2014-06-28 – 2014-06-29 (×2): 40 mg via ORAL
  Filled 2014-06-27 (×2): qty 1

## 2014-06-27 MED ORDER — SODIUM CHLORIDE 0.9 % IV SOLN
INTRAVENOUS | Status: DC
  Administered 2014-06-27: 21:00:00 via INTRAVENOUS

## 2014-06-27 MED ORDER — LEVOTHYROXINE SODIUM 25 MCG PO TABS
25.0000 ug | ORAL_TABLET | Freq: Every day | ORAL | Status: DC
  Administered 2014-06-28 – 2014-06-29 (×2): 25 ug via ORAL
  Filled 2014-06-27 (×3): qty 1

## 2014-06-27 MED ORDER — GABAPENTIN 100 MG PO CAPS
100.0000 mg | ORAL_CAPSULE | Freq: Every day | ORAL | Status: DC
  Administered 2014-06-27 – 2014-06-29 (×3): 100 mg via ORAL
  Filled 2014-06-27 (×3): qty 1

## 2014-06-27 MED ORDER — DONEPEZIL HCL 10 MG PO TABS
10.0000 mg | ORAL_TABLET | Freq: Every day | ORAL | Status: DC
  Administered 2014-06-27 – 2014-06-28 (×2): 10 mg via ORAL
  Filled 2014-06-27: qty 2
  Filled 2014-06-27 (×2): qty 1

## 2014-06-27 NOTE — ED Provider Notes (Signed)
CSN: [id number]     Arrival date & time 06/27/14  1607 History  This chart was scribed for Dustin Furry, MD by Lowella Petties, ED Scribe. The patient was seen in room APA08/APA08. Patient's care was started at 4:22 PM.  Chief Complaint  Patient presents with  . Chest Pain   The history is provided by the patient. No language interpreter was used.    HPI Comments: Dustin [name] is a 80 y.o. male who presents to the Emergency Department complaining of intermittent, left chest pain onset 4 days ago that is worsened today. He states that it feels like  "Angina" that he has had in the past.  He has a history of coronary artery bypass grafting. He follows with Dr.Sam Domenic Polite.  On his most recent Cardiology office visit his history was summarized as follows. "Cardiac catheterization in August 2012 demonstrated patent LIMA to LAD, patent SVG to diagonal, patent SVG to OM1 and OM 2, and diseased SVG to the PDA treated with DES x 2 as well as more distal disease that was managed medically. LVEF was in the range of 50-55% at that time."  Pt has been using 1-2 Ntg 3-4 times per day for three days.  His longest episode of pain is less than 1 hour. He attributes the onset of his symptoms to Monday night when he overexerted and got "too hot" doing some yard work.  He reports intermittent numbness in his left arm down to the wrist, and numbness in his left leg down to the knee onset two days ago. He reports difficulty ambulating due to this numbness.  He states that the has experienced this numbness before, but it is worse this time. He reports taking 2 nitroglycerin at home with some releif of chest pian and numbness in his arm and leg for 5 or 6 hours. He reports taking 4 or 5 Nitroglycerin today. He reports that he has taken more Nitroglycerin than usual over the past 4 days. He reports that he is not currently experiencing chest pain or numbness in his left arm or leg. He also reports that he no longer takes baby  Asprin and that instead he takes Plavix with some relief. He reports difficulty sleeping two nights ago, and taking 3 sleeping pills and one Xanax last night which helped him sleep.   PCP: Glo Herring., MD    Past Medical History  Diagnosis Date  . Arthritis   . Coronary atherosclerosis of native coronary artery     Multivessel status post CABG, DES x 2 SVG to PDA 8/12  . Mixed hyperlipidemia   . Essential hypertension, benign   . MI (myocardial infarction)   . Hematuria   . OSA (obstructive sleep apnea)   . Anxiety   . OA (osteoarthritis)   . DDD (degenerative disc disease)     Chronic back pain  . Alzheimer disease   . Enlarged prostate   . Hypothyroidism    Past Surgical History  Procedure Laterality Date  . Coronary artery bypass graft  1996    LIMA to LAD, SVG to D2, SVG to PDA, SVG to OM1 and OM2  . Hernia repair    . Coronary angioplasty with stent placement    . Colonoscopy  08/03/2004    Jenkins-numerous large diverticula in the descending, transverse, descending, and sigmoid colon. Otherwise normal exam.  . Colonoscopy  07/12/2012    RMR: External hemorrhoidal tag; multiple rectal and colonic polyps removed and/or treated as described  above. Pancolonic diverticulosis. Bx-tubular adenomas, rectal hyperplastic polyp. next colonoscopy in 06/2015.  [name] Esophagogastroduodenoscopy N/A 03/16/2013    Procedure: ESOPHAGOGASTRODUODENOSCOPY (EGD);  Surgeon: Daneil Dolin, MD;  Location: [facility];  Service: Endoscopy;  Laterality: N/A;  12:00-moved to [place] notified pt  . Esophagogastroduodenoscopy N/A 03/06/2013    Procedure: ESOPHAGOGASTRODUODENOSCOPY (EGD);  Surgeon: Daneil Dolin, MD;  Location: [facility];  Service: Endoscopy;  Laterality: N/A;   Family History  Problem Relation Age of Onset  . Heart disease    . Colon cancer Neg Hx    History  Substance Use Topics  . Smoking status: Former Smoker -- 2.00 packs/day for 40 years    Types: Cigarettes     Quit date: 12/27/1994  . Smokeless tobacco: Never Used  . Alcohol Use: No    Review of Systems  Constitutional: Negative for fever, chills, diaphoresis, appetite change and fatigue.  HENT: Negative for mouth sores, sore throat and trouble swallowing.   Eyes: Negative for visual disturbance.  Respiratory: Negative for cough, chest tightness, shortness of breath and wheezing.   Cardiovascular: Positive for chest pain (left).  Gastrointestinal: Negative for nausea, vomiting, abdominal pain, diarrhea and abdominal distention.  Endocrine: Negative for polydipsia, polyphagia and polyuria.  Genitourinary: Negative for dysuria, frequency and hematuria.  Musculoskeletal: Negative for gait problem.  Skin: Negative for color change, pallor and rash.  Neurological: Positive for numbness (Left upper extremity and left lower extremity). Negative for dizziness, syncope, light-headedness and headaches.  Hematological: Does not bruise/bleed easily.  Psychiatric/Behavioral: Negative for behavioral problems and confusion.      Allergies  Review of patient's allergies indicates no known allergies.  Home Medications   Prior to Admission medications   Medication Sig Start Date End Date Taking? Authorizing Provider  ALPRAZolam Duanne Moron) 1 MG tablet Take 1 mg by mouth daily as needed. For anxiety    Yes Historical Provider, MD  amLODipine (NORVASC) 10 MG tablet Take 1 tablet (10 mg total) by mouth daily. 10/11/11  Yes Hillary Bow, MD  carvedilol (COREG) 3.125 MG tablet Take 3.125 mg by mouth every morning.    Yes Historical Provider, MD  clopidogrel (PLAVIX) 75 MG tablet Take 75 mg by mouth daily.     Yes Historical Provider, MD  donepezil (ARICEPT) 10 MG tablet Take 1 tablet by mouth At bedtime. 07/01/12  Yes Historical Provider, MD  escitalopram (LEXAPRO) 10 MG tablet Take 10 mg by mouth daily.   Yes Historical Provider, MD  fentaNYL (DURAGESIC - DOSED MCG/HR) 100 MCG/HR Place 1 patch onto the skin  every 3 (three) days.  06/19/12  Yes Historical Provider, MD  gabapentin (NEURONTIN) 100 MG capsule Take 100 mg by mouth daily.  08/18/12  Yes Historical Provider, MD  hydrochlorothiazide (,MICROZIDE/HYDRODIURIL,) 12.5 MG capsule Take 12.5 mg by mouth daily.  07/12/11  Yes Historical Provider, MD  lansoprazole (PREVACID) 30 MG capsule Take 30 mg by mouth daily.  03/07/13  Yes Historical Provider, MD  levothyroxine (SYNTHROID, LEVOTHROID) 25 MCG tablet Take 25 mcg by mouth daily.  06/19/12  Yes Historical Provider, MD  NAMENDA 5 MG tablet Take 5 mg by mouth daily.  07/23/11  Yes Historical Provider, MD  nitroGLYCERIN (NITROSTAT) 0.4 MG SL tablet Place 1 tablet (0.4 mg total) under the tongue every 5 (five) minutes as needed. For chest pains 08/24/13  Yes Satira Sark, MD  oxyCODONE (ROXICODONE) 15 MG immediate release tablet Take 15 mg by mouth every 6 (six) hours as  needed for pain.  05/31/13  Yes Historical Provider, MD  pravastatin (PRAVACHOL) 40 MG tablet TAKE (1) TABLET BY MOUTH AT BEDTIME. 12/26/13   Satira Sark, MD   Triage Vitals: BP 126/62  Pulse 55  Resp 13  SpO2 95% Physical Exam  Constitutional: He is oriented to person, place, and time. He appears well-developed and well-nourished. No distress.  HENT:  Head: Normocephalic.  Eyes: Conjunctivae are normal. Pupils are equal, round, and reactive to light. No scleral icterus.  Neck: Normal range of motion. Neck supple. No thyromegaly present.  Cardiovascular: Normal rate and regular rhythm.  Exam reveals no gallop and no friction rub.   No murmur heard. Pulse regular.  Pulmonary/Chest: Effort normal and breath sounds normal. No respiratory distress. He has no wheezes. He has no rales.  Abdominal: Soft. Bowel sounds are normal. He exhibits no distension. There is no tenderness. There is no rebound.  Soft benign abdomen.   Musculoskeletal: Normal range of motion.  Neurological: He is alert and oriented to person, place, and time.   No pronator drift. Normal Exam.  Skin: Skin is warm and dry. No rash noted.  Psychiatric: He has a normal mood and affect. His behavior is normal.  Anxious.     ED Course  Procedures (including critical care time) DIAGNOSTIC STUDIES: Oxygen Saturation is 95% on room air, normal by my interpretation.    COORDINATION OF CARE: 4:35 PM-Discussed treatment plan which includes Cat Scan with pt at bedside and pt agreed to plan.   Labs Review Labs Reviewed  BASIC METABOLIC PANEL - Abnormal; Notable for the following:    Glucose, Bld 148 (*)    GFR calc non Af Amer 87 (*)    All other components within normal limits  CBC WITH DIFFERENTIAL  TROPONIN I    Imaging Review Ct Head Wo Contrast  06/27/2014   CLINICAL DATA:  Intermittent chest pain, headache  EXAM: CT HEAD WITHOUT CONTRAST  TECHNIQUE: Contiguous axial images were obtained from the base of the skull through the vertex without intravenous contrast.  COMPARISON:  MRI brain dated 03/09/2012  FINDINGS: No evidence of parenchymal hemorrhage or extra-axial fluid collection. No mass lesion, mass effect, or midline shift.  No CT evidence of acute infarction.  Mild Subcortical white matter and periventricular small vessel ischemic changes. Intracranial atherosclerosis.  Mild age related atrophy.  No ventriculomegaly.  Mild mucosal thickening in the bilateral ethmoid sinuses. The mastoid air cells are unopacified.  No evidence of calvarial fracture.  IMPRESSION: No evidence of acute intracranial abnormality.   Electronically Signed   By: Julian Hy M.D.   On: 06/27/2014 18:04   Dg Chest Port 1 View  06/27/2014   CLINICAL DATA:  Chest pain.  EXAM: PORTABLE CHEST - 1 VIEW  COMPARISON:  03/11/2014  FINDINGS: Changes from CABG surgery are stable. Cardiac silhouette is borderline enlarged. Normal mediastinal and hilar contours.  Lungs are clear.  No pleural effusion or pneumothorax.  Bony thorax is demineralized but grossly intact.  IMPRESSION:  No acute cardiopulmonary disease.   Electronically Signed   By: Lajean Manes M.D.   On: 06/27/2014 16:58     EKG Interpretation   Date/Time:  Thursday June 27 2014 16:52:14 EDT Ventricular Rate:  55 PR Interval:  188 QRS Duration: 126 QT Interval:  454 QTC Calculation: 434 R Axis:   7 Text Interpretation:  Sinus bradycardia Non-specific intra-ventricular  conduction block T wave abnormality, consider inferior ischemia Abnormal  ECG No change vs  07-06-2013 Confirmed by Jeneen Rinks  MD, [place] ([id number]) on  06/27/2014 5:28:16 PM      MDM   Final diagnoses:  Unstable angina    Pt remains pain free after NTG paste placed.  Given 324 mg aspirin here. No recurrence of pain, or numbness to LUE.  Pt has history of chronic stable angina with known distal vessel disease per cath 2012.  Pt not currently on long acting nitrate.  I will discuss admission with hospitalist.     I personally performed the services described in this documentation, which was scribed in my presence. The recorded information has been reviewed and is accurate.     Dustin Furry, MD 06/27/14 1816

## 2014-06-27 NOTE — Telephone Encounter (Signed)
Received call from our front desk stating  that patient and wife were sitting in [place].Patient states he has had CP the last three days,started while gardening.Reports taking  5 NTG today,2 while sitting in our office.He says he has SSCP with radiation to left shoulder and arm which he reports is now numb.Eescorted via WC to ED

## 2014-06-27 NOTE — ED Notes (Signed)
Pt stated that he is feeling better. HOB lowered per his request- Wife left at this time to get some soup for pt .

## 2014-06-27 NOTE — H&P (Addendum)
[name] History and Physical  Dustin [name] SEG:[id number] DOB: Jun 20, 1934 DOA: 06/27/2014  Referring physician: Tanna Furry, MD PCP: Glo Herring., MD   Chief Complaint: Chest Pain  HPI: Dustin [name] is a 80 y.o. male with a history of CAD presents with complaints of chest pain since Monday. Patient states that pain is located on the left side of his chest and seems to radiate into his left arm. In addition he states that he has had numbness in the arm as well as the left leg. Patient has had some weakness noted. He states that he took nitroglycerin and aspirin and this seemed to have resolved his pain. When he came into the ED he was pain free. Patient states that he thinks he may have gotten too hot as he has been busy working outdoors. Patient states that he has had no headaches no dizziness no syncope. He denies having focal motor deficits. Patient states that he has had a CABG and cath in the past and is followed by [facility].   Review of Systems:  Constitutional:  No weight loss, night sweats, Fevers, chills, fatigue.  HEENT:  No headaches, Difficulty swallowing Cardio-vascular:  ++chest pain, No Orthopnea, PND, swelling in lower extremities  GI:  No heartburn, indigestion, abdominal pain, nausea, vomiting, diarrhea  Resp:  No shortness of breath with exertion or at rest. No coughing up of blood No wheezing  Skin:  no rash or lesions.  GU:  no dysuria, change in color of urine, no urgency or frequency. No flank pain.  Musculoskeletal:  No joint pain or swelling. No decreased range of motion. No back pain.  Psych:  No change in mood or affect. No depression or anxiety. No memory loss.   Past Medical History  Diagnosis Date  . Arthritis   . Coronary atherosclerosis of native coronary artery     Multivessel status post CABG, DES x 2 SVG to PDA 8/12  . Mixed hyperlipidemia   . Essential hypertension, benign   . MI (myocardial infarction)   .  Hematuria   . OSA (obstructive sleep apnea)   . Anxiety   . OA (osteoarthritis)   . DDD (degenerative disc disease)     Chronic back pain  . Alzheimer disease   . Enlarged prostate   . Hypothyroidism    Past Surgical History  Procedure Laterality Date  . Coronary artery bypass graft  1996    LIMA to LAD, SVG to D2, SVG to PDA, SVG to OM1 and OM2  . Hernia repair    . Coronary angioplasty with stent placement    . Colonoscopy  08/03/2004    Jenkins-numerous large diverticula in the descending, transverse, descending, and sigmoid colon. Otherwise normal exam.  . Colonoscopy  07/12/2012    RMR: External hemorrhoidal tag; multiple rectal and colonic polyps removed and/or treated as described above. Pancolonic diverticulosis. Bx-tubular adenomas, rectal hyperplastic polyp. next colonoscopy in 06/2015.  [name] Esophagogastroduodenoscopy N/A 03/16/2013    Procedure: ESOPHAGOGASTRODUODENOSCOPY (EGD);  Surgeon: Daneil Dolin, MD;  [facility]: [facility];  Service: Endoscopy;  Laterality: N/A;  12:00-moved to [place] notified pt  . Esophagogastroduodenoscopy N/A 03/06/2013    Procedure: ESOPHAGOGASTRODUODENOSCOPY (EGD);  Surgeon: Daneil Dolin, MD;  Location: [facility];  Service: Endoscopy;  Laterality: N/A;   Social History:  reports that he quit smoking about 19 years ago. His smoking use included Cigarettes. He has a 80 pack-year smoking history. He has never used smokeless tobacco.  He reports that he does not drink alcohol or use illicit drugs.  No Known Allergies  Family History  Problem Relation Age of Onset  . Heart disease    . Colon cancer Neg Hx      Prior to Admission medications   Medication Sig Start Date End Date Taking? Authorizing Provider  ALPRAZolam Duanne Moron) 1 MG tablet Take 1 mg by mouth daily as needed. For anxiety    Yes Historical Provider, MD  amLODipine (NORVASC) 10 MG tablet Take 1 tablet (10 mg total) by mouth daily. 10/11/11  Yes Hillary Bow, MD    carvedilol (COREG) 3.125 MG tablet Take 3.125 mg by mouth every morning.    Yes Historical Provider, MD  clopidogrel (PLAVIX) 75 MG tablet Take 75 mg by mouth daily.     Yes Historical Provider, MD  donepezil (ARICEPT) 10 MG tablet Take 1 tablet by mouth At bedtime. 07/01/12  Yes Historical Provider, MD  escitalopram (LEXAPRO) 10 MG tablet Take 10 mg by mouth daily.   Yes Historical Provider, MD  fentaNYL (DURAGESIC - DOSED MCG/HR) 100 MCG/HR Place 1 patch onto the skin every 3 (three) days.  06/19/12  Yes Historical Provider, MD  gabapentin (NEURONTIN) 100 MG capsule Take 100 mg by mouth daily.  08/18/12  Yes Historical Provider, MD  hydrochlorothiazide (,MICROZIDE/HYDRODIURIL,) 12.5 MG capsule Take 12.5 mg by mouth daily.  07/12/11  Yes Historical Provider, MD  lansoprazole (PREVACID) 30 MG capsule Take 30 mg by mouth daily.  03/07/13  Yes Historical Provider, MD  levothyroxine (SYNTHROID, LEVOTHROID) 25 MCG tablet Take 25 mcg by mouth daily.  06/19/12  Yes Historical Provider, MD  NAMENDA 5 MG tablet Take 5 mg by mouth daily.  07/23/11  Yes Historical Provider, MD  nitroGLYCERIN (NITROSTAT) 0.4 MG SL tablet Place 1 tablet (0.4 mg total) under the tongue every 5 (five) minutes as needed. For chest pains 08/24/13  Yes Satira Sark, MD  oxyCODONE (ROXICODONE) 15 MG immediate release tablet Take 15 mg by mouth every 6 (six) hours as needed for pain.  05/31/13  Yes Historical Provider, MD  pravastatin (PRAVACHOL) 40 MG tablet TAKE (1) TABLET BY MOUTH AT BEDTIME. 12/26/13   Satira Sark, MD   Physical Exam: Filed Vitals:   06/27/14 1748  BP: 117/56  Pulse: 73  Resp:     BP 117/56  Pulse 73  Resp 16  Ht 5\' 9"  (1.753 m)  Wt 81.647 kg (180 lb)  BMI 26.57 kg/m2  SpO2 96%  General:  Appears calm and comfortable Eyes: PERRL, normal lids, irises & conjunctiva ENT: grossly normal hearing, lips & tongue Neck: no LAD, masses or thyromegaly Cardiovascular: RRR, no m/r/g. No LE edema. Telemetry:  SR, bradycardia Respiratory: CTA bilaterally, no w/r/r. Normal respiratory effort. Abdomen: soft, ntnd Skin: no rash or induration seen on limited exam Musculoskeletal: grossly normal tone BUE/BLE Psychiatric: grossly normal mood and affect, speech fluent and appropriate Neurologic: grossly non-focal.          Labs on Admission:  Basic Metabolic Panel:  Recent Labs Lab 06/27/14 1638  NA 140  K 4.0  CL 99  CO2 30  GLUCOSE 148*  BUN 14  CREATININE 0.74  CALCIUM 9.3   Liver Function Tests: No results found for this basename: AST, ALT, ALKPHOS, BILITOT, PROT, ALBUMIN,  in the last 168 hours No results found for this basename: LIPASE, AMYLASE,  in the last 168 hours No results found for this basename: AMMONIA,  in the last  168 hours CBC:  Recent Labs Lab 06/27/14 1638  WBC 7.1  NEUTROABS 5.0  HGB 14.0  HCT 41.8  MCV 95.4  PLT 220   Cardiac Enzymes:  Recent Labs Lab 06/27/14 1638  TROPONINI <0.30    BNP (last 3 results) No results found for this basename: PROBNP,  in the last 8760 hours CBG: No results found for this basename: GLUCAP,  in the last 168 hours  Radiological Exams on Admission: Ct Head Wo Contrast  06/27/2014   CLINICAL DATA:  Intermittent chest pain, headache  EXAM: CT HEAD WITHOUT CONTRAST  TECHNIQUE: Contiguous axial images were obtained from the base of the skull through the vertex without intravenous contrast.  COMPARISON:  MRI brain dated 03/09/2012  FINDINGS: No evidence of parenchymal hemorrhage or extra-axial fluid collection. No mass lesion, mass effect, or midline shift.  No CT evidence of acute infarction.  Mild Subcortical white matter and periventricular small vessel ischemic changes. Intracranial atherosclerosis.  Mild age related atrophy.  No ventriculomegaly.  Mild mucosal thickening in the bilateral ethmoid sinuses. The mastoid air cells are unopacified.  No evidence of calvarial fracture.  IMPRESSION: No evidence of acute intracranial  abnormality.   Electronically Signed   By: Julian Hy M.D.   On: 06/27/2014 18:04   Dg Chest Port 1 View  06/27/2014   CLINICAL DATA:  Chest pain.  EXAM: PORTABLE CHEST - 1 VIEW  COMPARISON:  03/11/2014  FINDINGS: Changes from CABG surgery are stable. Cardiac silhouette is borderline enlarged. Normal mediastinal and hilar contours.  Lungs are clear.  No pleural effusion or pneumothorax.  Bony thorax is demineralized but grossly intact.  IMPRESSION: No acute cardiopulmonary disease.   Electronically Signed   By: Lajean Manes M.D.   On: 06/27/2014 16:58    EKG: Independently reviewed. NSR bradycardia  Assessment/Plan Principal Problem:   Chest pain Active Problems:   HYPERLIPIDEMIA-MIXED   OBSTRUCTIVE SLEEP APNEA   HYPERTENSION, BENIGN   Coronary atherosclerosis of native coronary artery   1. Chest Pain -will admit to telemetry uinit -place on nitropaste to chest wall -will check enzymes -echo in am -in addition I would suggest a carotid doppler to assess his expanded symptoms of numbness on the left side of his body  2. Hyperlipidemia -continue with home medications  3. Hypertension -currently controlled -will monitor -his heart rate in running low at baseline will monitor  4. Sleep Apnea -will need to get with his wife and see what pressures he is on and if he is compliant with the CPAP device    Code Status: Full Code (must indicate code status--if unknown or must be presumed, indicate so) Family Communication: None (indicate person spoken with, if applicable, with phone number if by telephone) Disposition Plan: Home (indicate anticipated LOS)  Time spent: 93min  Shavar Gorka A [name] Pager [phone]  **Disclaimer: This note may have been dictated with voice recognition software. Similar sounding words can inadvertently be transcribed and this note may contain transcription errors which may not have been corrected upon publication of note.**

## 2014-06-27 NOTE — Progress Notes (Signed)
Patient refused to wear CPAP, says he has one at home but he does not wear it. Patient placed on [facility] 02 sats at 94%, i explained to patient if he changed his mind about it to get his RN to let me know. RT will continue to monitor

## 2014-06-27 NOTE — ED Notes (Signed)
Pt states that he has been having chest pain off and on since Monday - Has been taking Nitro and baby Asprin daily. Also co intermittent numbness /weakness of arms and leg

## 2014-06-28 ENCOUNTER — Inpatient Hospital Stay (HOSPITAL_COMMUNITY): Payer: Medicare Other

## 2014-06-28 ENCOUNTER — Observation Stay (HOSPITAL_COMMUNITY): Payer: Medicare Other

## 2014-06-28 ENCOUNTER — Encounter (HOSPITAL_COMMUNITY): Payer: Self-pay | Admitting: Physician Assistant

## 2014-06-28 DIAGNOSIS — M6281 Muscle weakness (generalized): Secondary | ICD-10-CM

## 2014-06-28 DIAGNOSIS — E785 Hyperlipidemia, unspecified: Secondary | ICD-10-CM

## 2014-06-28 DIAGNOSIS — R079 Chest pain, unspecified: Secondary | ICD-10-CM

## 2014-06-28 DIAGNOSIS — I059 Rheumatic mitral valve disease, unspecified: Secondary | ICD-10-CM

## 2014-06-28 DIAGNOSIS — R531 Weakness: Secondary | ICD-10-CM | POA: Diagnosis present

## 2014-06-28 LAB — TROPONIN I: Troponin I: <0.3 ng/mL (ref <0.30)

## 2014-06-28 MED ORDER — DIPHENHYDRAMINE HCL 25 MG PO CAPS
25.0000 mg | ORAL_CAPSULE | Freq: Three times a day (TID) | ORAL | Status: DC | PRN
  Filled 2014-06-28: qty 1

## 2014-06-28 MED ORDER — STROKE: EARLY STAGES OF RECOVERY BOOK
Freq: Once | Status: AC
Start: 2014-06-28 — End: 2014-06-28
  Administered 2014-06-28: 16:00:00
  Filled 2014-06-28 (×2): qty 1

## 2014-06-28 MED ORDER — ASPIRIN EC 81 MG PO TBEC
81.0000 mg | DELAYED_RELEASE_TABLET | Freq: Every day | ORAL | Status: DC
  Administered 2014-06-28 – 2014-06-29 (×2): 81 mg via ORAL
  Filled 2014-06-28 (×2): qty 1

## 2014-06-28 NOTE — Progress Notes (Signed)
[name] PROGRESS NOTE  Dustin [name] JSE:[id number] DOB: 1934/01/02 DOA: 06/27/2014 PCP: Glo Herring., MD  Assessment/Plan: 1. Chest pain.  He has ruled out for ACS with negative cardiac markers.  EKG does show some T wave inversions in inferior leads, difficult to say whether these are new.  Echocardiogram shows EF of 50% with no regional wall motion abnormalities.  He does have a history of CAD with CABG in the past.  Last catheterization appears to be in 2012 where DES was placed in SVG to PDA. With this patient's significant coronary disease, he would likely benefit from cardiology input to see if any further cardiac testing will be required. Unfortunately, since we do not have cardiology services available at [facility] today, he will require transfer to [facility]. I discussed case with Dr. Harrington Challenger who will see the patient in consultation upon his arrival. 2. Left-sided weakness. The patient complained of left upper and lower extremity weakness and numbness. He reports symptoms are persistent for several days, but improved prior to admission. Currently on exam he does not have any significant weakness. He has had carotid Dopplers and echocardiogram completed. I think he would benefit from MRI of brain to further evaluate for any CVA/TIA. Unfortunately, since MRI services are not available at [facility] today, he will require transfer to [facility]. Discussed case with Dr. Tana Coast who has accepted the patient in transfer. Continue antiplatelet agents. 3. Hyperlipidemia. Continue home medications 4. Hypertension. Currently controlled. He does appear to have some sinus bradycardia. May need to adjust beta blocker. 5. Sleep apnea. Continue CPAP.  Code Status: full code Family Communication: discussed with patient and family Disposition Plan: Will transfer patient to [name] for further treatment.  Discussed with Dr. Tana Coast who has accepted  patient.   Consultants:    Procedures:    Antibiotics:    HPI/Subjective: Overall feeling better. Had some recurrence of chest discomfort this morning, now resolved  Objective: Filed Vitals:   06/28/14 1354  BP: 111/54  Pulse: 55  Temp: 97.3 F (36.3 C)  Resp: 20    Intake/Output Summary (Last 24 hours) at 06/28/14 1357 Last data filed at 06/28/14 1355  Gross per 24 hour  Intake 1413.33 ml  Output    450 ml  Net 963.33 ml   Filed Weights   06/27/14 1638 06/27/14 1945 06/28/14 0446  Weight: 81.647 kg (180 lb) 81.647 kg (180 lb) 81.76 kg (180 lb 4 oz)    Exam:   General:  NAD  Cardiovascular: S1, S2 RRR  Respiratory: CTA B  Abdomen: soft, nt, nd, bs+  Musculoskeletal: no edema b/l  Neuro: strength appears equal bilaterally in upper and lower extremities, CN grossly intact   Data Reviewed: Basic Metabolic Panel:  Recent Labs Lab 06/27/14 1638  NA 140  K 4.0  CL 99  CO2 30  GLUCOSE 148*  BUN 14  CREATININE 0.74  CALCIUM 9.3   Liver Function Tests: No results found for this basename: AST, ALT, ALKPHOS, BILITOT, PROT, ALBUMIN,  in the last 168 hours No results found for this basename: LIPASE, AMYLASE,  in the last 168 hours No results found for this basename: AMMONIA,  in the last 168 hours CBC:  Recent Labs Lab 06/27/14 1638  WBC 7.1  NEUTROABS 5.0  HGB 14.0  HCT 41.8  MCV 95.4  PLT 220   Cardiac Enzymes:  Recent Labs Lab 06/27/14 1638 06/27/14 2011 06/27/14 2254 06/28/14 0146  TROPONINI <0.30 <0.30 <0.30 <0.30  BNP (last 3 results) No results found for this basename: PROBNP,  in the last 8760 hours CBG: No results found for this basename: GLUCAP,  in the last 168 hours  No results found for this or any previous visit (from the past 240 hour(s)).   Studies: Ct Head Wo Contrast  06/27/2014   CLINICAL DATA:  Intermittent chest pain, headache  EXAM: CT HEAD WITHOUT CONTRAST  TECHNIQUE: Contiguous axial images were  obtained from the base of the skull through the vertex without intravenous contrast.  COMPARISON:  MRI brain dated 03/09/2012  FINDINGS: No evidence of parenchymal hemorrhage or extra-axial fluid collection. No mass lesion, mass effect, or midline shift.  No CT evidence of acute infarction.  Mild Subcortical white matter and periventricular small vessel ischemic changes. Intracranial atherosclerosis.  Mild age related atrophy.  No ventriculomegaly.  Mild mucosal thickening in the bilateral ethmoid sinuses. The mastoid air cells are unopacified.  No evidence of calvarial fracture.  IMPRESSION: No evidence of acute intracranial abnormality.   Electronically Signed   By: Julian Hy M.D.   On: 06/27/2014 18:04   US Carotid Bilateral  06/28/2014   CLINICAL DATA:  Left-sided numbness  EXAM: BILATERAL CAROTID DUPLEX ULTRASOUND  TECHNIQUE: Pearline Cables scale imaging, color Doppler and duplex ultrasound were performed of bilateral carotid and vertebral arteries in the neck.  COMPARISON:  None.  FINDINGS: Criteria: Quantification of carotid stenosis is based on velocity parameters that correlate the residual internal carotid diameter with NASCET-based stenosis levels, using the diameter of the distal internal carotid lumen as the denominator for stenosis measurement.  The following velocity measurements were obtained:  RIGHT  ICA:  61/17 cm/sec  CCA:  77/41 cm/sec  SYSTOLIC ICA/CCA RATIO:  2.87  DIASTOLIC ICA/CCA RATIO:  1.5  ECA:  218 cm/sec  LEFT  ICA:  102/29 cm/sec  CCA:  86/7 cm/sec  SYSTOLIC ICA/CCA RATIO:  6.72  DIASTOLIC ICA/CCA RATIO:  3.2  ECA:  70 cm/sec  RIGHT CAROTID ARTERY: Calcific plaque is noted in the common and internal carotid artery on the right. The waveforms, velocities and flow velocity ratios however demonstrate no evidence of focal hemodynamically significant stenosis. Elevation of the external carotid artery velocity is noted suggestive of a degree of narrowing.  RIGHT VERTEBRAL ARTERY:  Antegrade   LEFT CAROTID ARTERY: Diffuse calcific plaque is noted. The waveforms, velocities and flow velocity ratios however demonstrate no evidence of focal hemodynamically significant stenosis.  LEFT VERTEBRAL ARTERY:  Antegrade in nature.  IMPRESSION: No internal carotid artery stenosis is noted.  Bilateral calcific plaque is seen.  Elevated velocities suggestive of a stenosis in the right external carotid artery.   Electronically Signed   By: Inez Catalina M.D.   On: 06/28/2014 11:35   Dg Chest Port 1 View  06/27/2014   CLINICAL DATA:  Chest pain.  EXAM: PORTABLE CHEST - 1 VIEW  COMPARISON:  03/11/2014  FINDINGS: Changes from CABG surgery are stable. Cardiac silhouette is borderline enlarged. Normal mediastinal and hilar contours.  Lungs are clear.  No pleural effusion or pneumothorax.  Bony thorax is demineralized but grossly intact.  IMPRESSION: No acute cardiopulmonary disease.   Electronically Signed   By: Lajean Manes M.D.   On: 06/27/2014 16:58    Scheduled Meds: . amLODipine  10 mg Oral Daily  . antiseptic oral rinse  15 mL Mouth Rinse BID  . carvedilol  3.125 mg Oral Q breakfast  . clopidogrel  75 mg Oral Q breakfast  . donepezil  10 mg Oral QHS  . escitalopram  10 mg Oral Daily  . fentaNYL  100 mcg Transdermal Q72H  . gabapentin  100 mg Oral Daily  . heparin  5,000 Units Subcutaneous 3 times per day  . hydrochlorothiazide  12.5 mg Oral Daily  . levothyroxine  25 mcg Oral QAC breakfast  . memantine  5 mg Oral Daily  . nitroGLYCERIN  1 inch Topical 4 times per day  . pantoprazole  40 mg Oral Daily  . simvastatin  20 mg Oral q1800   Continuous Infusions: . sodium chloride 50 mL/hr at 06/27/14 2117    Principal Problem:   Chest pain Active Problems:   HYPERLIPIDEMIA-MIXED   OBSTRUCTIVE SLEEP APNEA   HYPERTENSION, BENIGN   Coronary atherosclerosis of native coronary artery    Time spent: 35mins    Mikalah Skyles  [name] Pager [phone]. If 7PM-7AM, please contact  night-coverage at [email or web address], password [facility] 06/28/2014, 1:57 PM  LOS: 1 day

## 2014-06-28 NOTE — Progress Notes (Signed)
  Echocardiogram 2D Echocardiogram has been performed.  [place], [place] 06/28/2014, 12:09 PM

## 2014-06-28 NOTE — Progress Notes (Signed)
UR completed 

## 2014-06-28 NOTE — Consult Note (Signed)
Cardiology Consultation Note  Patient ID: Dustin [name], MRN: [id number], DOB/AGE: 07-05-1934 80 y.o. Admit date: 06/27/2014   Date of Consult: 06/28/2014 Primary Physician: Glo Herring., MD Primary Cardiologist: Domenic Polite  Chief Complaint: CP, weakness Reason for Consult: CP  HPI: Dustin [name] is a 80 y/o M with history of CAD s/p CABG 1996, s/p DESx2 to SVG-PDA 07/2011 with distal disease managed medically, HTN, HL, anxiety, atrophic gastritis 02/2013 who presented to [facility] initially with chest pain and focal parasthesias/weakness ion his left arm and leg. He states that this past Monday he was working outdoors in the heat, was very tired and sweating, took a break and then started having left-sided chest discomfort. He states that this felt like his angina symptoms. Since that time he has been experiencing recurrent left-sided chest pain, does improve when he takes nitroglycerin. He also has been intermittently experiencing a feeling of "tingling" in his left arm and upper left leg. He states that this has been a more long term problem, but it has been worse the last week. No reported speech deficits or definitive motor weakness.   He was transferred to [facility] for further evaluation. He has already ruled out for MI with troponin neg x 3. 2D Echo EF 50%, mildly dilated LV, mild LVH, grade 1 diastolic dysfunction, high ventricular filling pressure, borderline aortic root dilitation 3.9cm, mod MR, LA mildly dilated, RV fcn mildly reduced, mild-mod TR, PA pressure 8mmHg. Carotid US: no ICA stenosis; bilateral calcific plaque, elevated velocities suggestive of a stenosis in the right external carotid artery. CT head nonacute. MRI just done and pending. Labs otherwise unremarkable except glu mildly elevated. ECG does show inferolateral ST-T wave abnormalities, somewhat more prominent compared to tracing from last year.   Past Medical History  Diagnosis Date  . Arthritis   . Coronary atherosclerosis of  native coronary artery     a. Multivessel s/p CABG 1996. b. s/p DES x 2 SVG to PDA 8/12 with distal disease managed medically.  . Mixed hyperlipidemia   . Essential hypertension, benign   . MI (myocardial infarction)   . Hematuria   . OSA (obstructive sleep apnea)   . Anxiety   . OA (osteoarthritis)   . DDD (degenerative disc disease)     Chronic back pain  . Alzheimer disease   . Enlarged prostate   . Hypothyroidism   . Atrophic gastritis     a. By EGD 02/2013.      Most Recent Cardiac Studies: 2D echo 06/28/14 - Left ventricle: Systolic function is low normal, EF 50%. The cavity size was mildly dilated. Wall thickness was increased in a pattern of mild LVH. There was an increased relative contribution of atrial contraction to ventricular filling. Doppler parameters are consistent with abnormal left ventricular relaxation (grade 1 diastolic dysfunction). Doppler parameters are consistent with high ventricular filling pressure. - Aortic valve: Mildly calcified annulus. Trileaflet; mildly thickened leaflets. There was no stenosis. - Aorta: Borderline aortic root dilatation. 3.9 cm. - Mitral valve: There was moderate regurgitation. - Left atrium: The atrium was mildly dilated. - Right ventricle: Systolic function was mildly reduced. - Tricuspid valve: There was mild-moderate regurgitation. - Pulmonary arteries: PA peak pressure: 31 mm Hg (S).  Cath 07/2011 CARDIAC CATHETERIZATION  INDICATIONS: Mr. Heidenreich is well known to me. He has had prior  revascularization surgery, and saphenous vein graft intervention. He  had a modest lesion in the right coronary graft in 2009, this had more  recent symptoms. He was  brought back today for further evaluation. We  have been discussing this for sometime.  PROCEDURES:  1. Left heart catheterization.  2. Selective coronary arteriography.  3. Selective left ventriculography.  4. Saphenous vein graft angiography.  5. Selective left internal  mammary angiography.  6. Percutaneous intervention of the saphenous vein graft to the  posterior descending artery using spider protection and placement  of two drug-eluting stents.  DESCRIPTION OF PROCEDURE: The patient was brought to the Cath Lab,  prepped and draped in the usual fashion. Through an anterior puncture,  the femoral artery was entered. A 5-French sheath was placed.  Diagnostic views of the coronaries, vein grafts, and internal mammary  were obtained. Central aortic and left ventricular pressures were  measured with pigtail. Ventriculography was performed in the RAO  projection. The RCA graft clearly had some progression from the  previous study. There was distal progression with some haziness beyond  the distally placed stent, and there was clear-cut haziness and  progression in the proximal body of the stent. As a result, plans were  made for percutaneous intervention. The patient was agreeable to  proceed. The patient is already on Plavix. Bivalirudin was given  according to protocol and ACT was checked and found to be appropriate.  The 5-French sheath was upgraded to a 6-French sheath. A right bypass  catheter with side holes was utilized. We were able to cross with a  traverse wire distally into the PDA. A spider was gently manipulated  beyond the distal stent into the distal artery, and a 3-mm spider  appeared to provide good distal protection. Direct stenting was  achieved using a 2.75 x 16 Promus Element drug-eluting stent. Because  the stent appeared to be slightly undersized even with maximum  dilatation, we upgraded to a 3.25 mm postdilatation noncompliant  balloon, and this was deployed within the stent throughout the stent  architecture. This balloon was then removed, and the spider catheter  retrieved. We then put a BMW wire down in the PDA, and a 20-mm x 2.5-mm  stent was placed distally carefully. The stent was deployed at  approximately 17 atmospheres.  There was marked improvement in the  appearance of the artery. There was no evidence of mal-expansion, and  given the fact that it is a vein graft we elected not to post-dilate  this particular vessel. The patient had minimal chest pain during the  procedure, and no reflow phenomenon throughout the case. He tolerated  the procedure extremely well. There were no major complications.  HEMODYNAMIC FINDINGS:  1. Central aortic pressure 140/61, mean 89.  2. LV pressure 133/16.  3. There was no gradient or pullback across the aortic valve.  ANGIOGRAPHIC DATA.:  1. The left main coronary artery is somewhat calcified, but free of  critical disease.  2. The LAD is severely diseased at the takeoff of the second diagonal.  There is evidence of competitive filling distally.  3. There is flow into the second diagonal from a vein graft. The vein  graft is widely patent without significant narrowing.  4. The internal mammary to distal LAD is widely patent.  5. There is a first marginal branch that probably has about 50% area  of narrowing. Flow into the distal vessel is excellent. The AV  circumflex is then subtotally occluded and then totally occluded  after this.  6. The saphenous vein graft to two branches of the marginal are  intact. Following the second touchdown, the vessel beyond that is  occluded and fills by retrograde collaterals. In addition, some  collateralization of the distal AV circumflex is seen with  incomplete coverage. There is also collateralization noted from  the saphenous vein graft injection to the right.  7. The saphenous vein graft to the distal right coronary artery has  progressed disease. There is a 70-80% hazy lesion in the mid body  of the graft followed by a widely patent stent. Distal to the  stent prior to the PDA is an area of 70% narrowing with a haziness  distally. Following stenting, both of the areas looked  substantially better without significant high-grade  residual  narrowing.  8. The ventriculogram demonstrates hypo to akinesis of the inferobasal  segment. Ejection fraction in the range of 50-55%.  CONCLUSION:  1. Mild reduction of left ventricular function with an inferobasal  wall motion abnormality.  2. Continued patency internal mammary to the LAD.  3. Continued patency of saphenous vein graft to the diagonal.  4. Continued patency of the saphenous vein graft to two branches of  the marginal with severe disease of the marginal system retrograde  as noted in the above text.  5. Progression of disease in the saphenous vein graft to the posterior  descending branch with successful percutaneous stenting.  6. High-grade stenosis in the limb between the PDA and PLA system,  which has been previously noted. Note that there are some  collaterals noted also into the PLA system.  DISPOSITION: At the present in time, the patient will be treated  medically. He will remain on dual antiplatelet therapy. Our hope is  that this will improve his symptoms.  Loretha Brasil. Lia Foyer, MD, [facility]     Surgical History:  Past Surgical History  Procedure Laterality Date  . Coronary artery bypass graft  1996    LIMA to LAD, SVG to D2, SVG to PDA, SVG to OM1 and OM2  . Hernia repair    . Coronary angioplasty with stent placement    . Colonoscopy  08/03/2004    Jenkins-numerous large diverticula in the descending, transverse, descending, and sigmoid colon. Otherwise normal exam.  . Colonoscopy  07/12/2012    RMR: External hemorrhoidal tag; multiple rectal and colonic polyps removed and/or treated as described above. Pancolonic diverticulosis. Bx-tubular adenomas, rectal hyperplastic polyp. next colonoscopy in 06/2015.  [name] Esophagogastroduodenoscopy N/A 03/16/2013    Procedure: ESOPHAGOGASTRODUODENOSCOPY (EGD);  Surgeon: Daneil Dolin, MD;  Location: [facility];  Service: Endoscopy;  Laterality: N/A;  12:00-moved to [place] notified pt  .  Esophagogastroduodenoscopy N/A 03/06/2013    Procedure: ESOPHAGOGASTRODUODENOSCOPY (EGD);  Surgeon: Daneil Dolin, MD;  Location: [facility];  Service: Endoscopy;  Laterality: N/A;     Home Meds: Prior to Admission medications   Medication Sig Start Date End Date Taking? Authorizing Provider  ALPRAZolam Duanne Moron) 1 MG tablet Take 1 mg by mouth daily as needed. For anxiety    Yes Historical Provider, MD  amLODipine (NORVASC) 10 MG tablet Take 1 tablet (10 mg total) by mouth daily. 10/11/11  Yes Hillary Bow, MD  carvedilol (COREG) 3.125 MG tablet Take 3.125 mg by mouth every morning.    Yes Historical Provider, MD  clopidogrel (PLAVIX) 75 MG tablet Take 75 mg by mouth daily.     Yes Historical Provider, MD  donepezil (ARICEPT) 10 MG tablet Take 1 tablet by mouth At bedtime. 07/01/12  Yes Historical Provider, MD  escitalopram (LEXAPRO) 10 MG tablet Take 10 mg by mouth daily.  Yes Historical Provider, MD  fentaNYL (DURAGESIC - DOSED MCG/HR) 100 MCG/HR Place 1 patch onto the skin every 3 (three) days.  06/19/12  Yes Historical Provider, MD  gabapentin (NEURONTIN) 100 MG capsule Take 100 mg by mouth daily.  08/18/12  Yes Historical Provider, MD  hydrochlorothiazide (,MICROZIDE/HYDRODIURIL,) 12.5 MG capsule Take 12.5 mg by mouth daily.  07/12/11  Yes Historical Provider, MD  lansoprazole (PREVACID) 30 MG capsule Take 30 mg by mouth daily.  03/07/13  Yes Historical Provider, MD  levothyroxine (SYNTHROID, LEVOTHROID) 25 MCG tablet Take 25 mcg by mouth daily.  06/19/12  Yes Historical Provider, MD  NAMENDA 5 MG tablet Take 5 mg by mouth daily.  07/23/11  Yes Historical Provider, MD  nitroGLYCERIN (NITROSTAT) 0.4 MG SL tablet Place 1 tablet (0.4 mg total) under the tongue every 5 (five) minutes as needed. For chest pains 08/24/13  Yes Satira Sark, MD  oxyCODONE (ROXICODONE) 15 MG immediate release tablet Take 15 mg by mouth every 6 (six) hours as needed for pain.  05/31/13  Yes Historical Provider, MD    pravastatin (PRAVACHOL) 40 MG tablet TAKE (1) TABLET BY MOUTH AT BEDTIME. 12/26/13   Satira Sark, MD    Inpatient Medications:  . amLODipine  10 mg Oral Daily  . antiseptic oral rinse  15 mL Mouth Rinse BID  . aspirin EC  81 mg Oral Daily  . carvedilol  3.125 mg Oral Q breakfast  . clopidogrel  75 mg Oral Q breakfast  . donepezil  10 mg Oral QHS  . escitalopram  10 mg Oral Daily  . fentaNYL  100 mcg Transdermal Q72H  . gabapentin  100 mg Oral Daily  . heparin  5,000 Units Subcutaneous 3 times per day  . hydrochlorothiazide  12.5 mg Oral Daily  . levothyroxine  25 mcg Oral QAC breakfast  . memantine  5 mg Oral Daily  . nitroGLYCERIN  1 inch Topical 4 times per day  . pantoprazole  40 mg Oral Daily  . simvastatin  20 mg Oral q1800   . sodium chloride 50 mL/hr at 06/27/14 2117    Allergies: No Known Allergies  History   Social History  . Marital Status: Married    Spouse Name: N/A    Number of Children: N/A  . Years of Education: N/A   Occupational History  . retired    Social History Main Topics  . Smoking status: Former Smoker -- 2.00 packs/day for 40 years    Types: Cigarettes    Quit date: 12/27/1994  . Smokeless tobacco: Never Used  . Alcohol Use: No  . Drug Use: No  . Sexual Activity: Not on file   Other Topics Concern  . Not on file   Social History Narrative   Lives w/ ailing wife.   Son brings meals 3x per week     Family History  Problem Relation Age of Onset  . Heart disease    . Colon cancer Neg Hx      Review of Systems: No cough, fevers, chills. Describes diffuse arthritic pains. Stable appetite. No syncope. Other systems reviewed and negative except as outlined above.  Labs:  Recent Labs  06/27/14 1638 06/27/14 2011 06/27/14 2254 06/28/14 0146  TROPONINI <0.30 <0.30 <0.30 <0.30   Lab Results  Component Value Date   WBC 7.1 06/27/2014   HGB 14.0 06/27/2014   HCT 41.8 06/27/2014   MCV 95.4 06/27/2014   PLT 220 06/27/2014     Recent Labs  Lab 06/27/14 1638  NA 140  K 4.0  CL 99  CO2 30  BUN 14  CREATININE 0.74  CALCIUM 9.3  GLUCOSE 148*    Lab Results  Component Value Date   DDIMER 0.34 08/05/2011    Radiology/Studies:  Ct Head Wo Contrast  06/27/2014   CLINICAL DATA:  Intermittent chest pain, headache  EXAM: CT HEAD WITHOUT CONTRAST  TECHNIQUE: Contiguous axial images were obtained from the base of the skull through the vertex without intravenous contrast.  COMPARISON:  MRI brain dated 03/09/2012  FINDINGS: No evidence of parenchymal hemorrhage or extra-axial fluid collection. No mass lesion, mass effect, or midline shift.  No CT evidence of acute infarction.  Mild Subcortical white matter and periventricular small vessel ischemic changes. Intracranial atherosclerosis.  Mild age related atrophy.  No ventriculomegaly.  Mild mucosal thickening in the bilateral ethmoid sinuses. The mastoid air cells are unopacified.  No evidence of calvarial fracture.  IMPRESSION: No evidence of acute intracranial abnormality.   Electronically Signed   By: Julian Hy M.D.   On: 06/27/2014 18:04   US Carotid Bilateral  06/28/2014   CLINICAL DATA:  Left-sided numbness  EXAM: BILATERAL CAROTID DUPLEX ULTRASOUND  TECHNIQUE: Pearline Cables scale imaging, color Doppler and duplex ultrasound were performed of bilateral carotid and vertebral arteries in the neck.  COMPARISON:  None.  FINDINGS: Criteria: Quantification of carotid stenosis is based on velocity parameters that correlate the residual internal carotid diameter with NASCET-based stenosis levels, using the diameter of the distal internal carotid lumen as the denominator for stenosis measurement.  The following velocity measurements were obtained:  RIGHT  ICA:  61/17 cm/sec  CCA:  16/10 cm/sec  SYSTOLIC ICA/CCA RATIO:  9.60  DIASTOLIC ICA/CCA RATIO:  1.5  ECA:  218 cm/sec  LEFT  ICA:  102/29 cm/sec  CCA:  45/4 cm/sec  SYSTOLIC ICA/CCA RATIO:  0.98  DIASTOLIC ICA/CCA RATIO:  3.2  ECA:  70  cm/sec  RIGHT CAROTID ARTERY: Calcific plaque is noted in the common and internal carotid artery on the right. The waveforms, velocities and flow velocity ratios however demonstrate no evidence of focal hemodynamically significant stenosis. Elevation of the external carotid artery velocity is noted suggestive of a degree of narrowing.  RIGHT VERTEBRAL ARTERY:  Antegrade  LEFT CAROTID ARTERY: Diffuse calcific plaque is noted. The waveforms, velocities and flow velocity ratios however demonstrate no evidence of focal hemodynamically significant stenosis.  LEFT VERTEBRAL ARTERY:  Antegrade in nature.  IMPRESSION: No internal carotid artery stenosis is noted.  Bilateral calcific plaque is seen.  Elevated velocities suggestive of a stenosis in the right external carotid artery.   Electronically Signed   By: Inez Catalina M.D.   On: 06/28/2014 11:35   Dg Chest Port 1 View  06/27/2014   CLINICAL DATA:  Chest pain.  EXAM: PORTABLE CHEST - 1 VIEW  COMPARISON:  03/11/2014  FINDINGS: Changes from CABG surgery are stable. Cardiac silhouette is borderline enlarged. Normal mediastinal and hilar contours.  Lungs are clear.  No pleural effusion or pneumothorax.  Bony thorax is demineralized but grossly intact.  IMPRESSION: No acute cardiopulmonary disease.   Electronically Signed   By: Lajean Manes M.D.   On: 06/27/2014 16:58    Physical Exam: Blood pressure 126/64, pulse 51, temperature 98.3 F (36.8 C), temperature source Oral, resp. rate 19, height 5\' 9"  (1.753 m), weight 180 lb 4 oz (81.76 kg), SpO2 96.00%. General: Well developed, well nourished, in no acute distress. Head: Sclera non-icteric, no xanthomas,  nares are without discharge.  Neck: Negative for carotid bruits. JVD not elevated. Lungs: Clear bilaterally to auscultation without wheezes, rales, or rhonchi. Breathing is unlabored. Heart: RRR with S1 S2. No murmurs, rubs, or gallops appreciated. Abdomen: Soft, non-tender, non-distended with normoactive bowel  sounds. No rebound/guarding. Msk:  Strength and tone appear normal for age. Extremities: No clubbing or cyanosis. No edema.  Distal pedal pulses are 2+ and equal bilaterally. Neuro: Alert and oriented X 3. No facial asymmetry. No focal motor deficit. Psych:  Responds to questions appropriately with somewhat flat affect.    Assessment   1. Presentation with recurrent left-sided chest pain, concerning for accelerating angina. Patient reports symptoms since exertion on Monday. ECG does show somewhat progressive inferolateral ST-T wave abnormalities, however troponin I levels are normal. Concurrently, he has been experiencing left-sided paresthesias as detailed above. Etiology is not clear at this point. Head CT negative for acute findings, head MRI pending. No significant obstructive ICA stenoses by Dopplers.  2. Multivessel CAD status post CABG in 1996, status post DES x2 to the SVG to PDA in August 2012. Had distal disease that was managed medically at that time. Recent LVEF 50% by followup echocardiogram.  3. Left-sided paresthesias, workup underway. Head MRI completed this evening and pending.  4. Obstructive sleep apnea, on CPAP.  5. Hypertension.  6. Hyperlipidemia.  7. Alzheimer's disease.  8. History of atrophic gastritis.   Plan  Discussed with patient and son at bedside. Would await results of head MRI to exclude acute CNS event. We will keep him n.p.o. after midnight with tentative plans for a Lexiscan Myoview on medical therapy for ischemic evaluation, prefer to hold off on repeat cardiac catheterization for now pending further CNS workup. He will need to have the [facility] ordered if head MRI is negative. He is on a good cardiac medical regimen including aspirin, Coreg, Norvasc, Plavix, Zocor, subcutaneous heparin. Blood pressure and heart rate well controlled.   Signed, Melina Copa PA-C 06/28/2014, 6:40 PM   Attending note:  Patient seen and examined. Reviewed  records and modified above note by Ms. Dunn PA-C including completion of the assessment and plan sections, reflecting my findings and recommendations.  Satira Sark, M.D., F.A.C.C.

## 2014-06-28 NOTE — Progress Notes (Signed)
Late Entry:  MD Notified Me that the patient would need to be transferred to [facility] so that he could see the cardiologist and have and MRI done.  The patient was prepared to go to [facility].  Discussed the patient/family plans for transfer as far as the room and steps to get patient ready for transport.  They verbalized understanding.  Spoke with the [facility] transporter and gave report.  [facility] arrived to transfer the patient approx. 1530 and the patient was transferred with packet.   1545 approx gave report given to [facility] at [facility] 3W.  She voiced some concerns about the patient not having a MRI order, NIH scale, RN swallow eval not being done.  I voiced to her that the patient had not on my shift had or c/o any stroke/TIA like symptoms and that the patient was getting OOB to the bathroom and eating meals without difficulty.  She asked about the MD note and the MRI for a TIA she stated that the patient would need the stroke RN screen and NIH prior to arrival.  I apologized to her and told her I was not aware of the recent MD note and I was doing as I was ordered.  I asked her if she wold like to speak with the MD since he was available.  I also went on to tell her that the patient had already left, otherwise I would be glad to do the assessment on the patient.  She verbalized understanding.

## 2014-06-28 NOTE — Progress Notes (Signed)
Pt refuses to wear CPAP. Pt encouraged to call RT if pt changes mind. No distress noted. 

## 2014-06-29 ENCOUNTER — Other Ambulatory Visit (HOSPITAL_COMMUNITY): Payer: Medicare Other

## 2014-06-29 ENCOUNTER — Inpatient Hospital Stay (HOSPITAL_COMMUNITY): Payer: Medicare Other

## 2014-06-29 DIAGNOSIS — R079 Chest pain, unspecified: Secondary | ICD-10-CM

## 2014-06-29 DIAGNOSIS — I200 Unstable angina: Secondary | ICD-10-CM

## 2014-06-29 LAB — HEMOGLOBIN A1C
Hgb A1c MFr Bld: 5.8 % — ABNORMAL HIGH (ref <5.7)
Hgb A1c MFr Bld: 5.7 % — ABNORMAL HIGH (ref <5.7)
Mean Plasma Glucose: 120 mg/dL — ABNORMAL HIGH (ref <117)
Mean Plasma Glucose: 117 mg/dL — ABNORMAL HIGH (ref <117)

## 2014-06-29 LAB — BASIC METABOLIC PANEL
ANION GAP: 11 (ref 5–15)
BUN: 10 mg/dL (ref 6–23)
CO2: 30 meq/L (ref 19–32)
CREATININE: 0.64 mg/dL (ref 0.50–1.35)
Calcium: 8.7 mg/dL (ref 8.4–10.5)
Chloride: 100 mEq/L (ref 96–112)
GFR calc Af Amer: >90 mL/min (ref >90)
GLUCOSE: 95 mg/dL (ref 70–99)
Potassium: 3.8 mEq/L (ref 3.7–5.3)
Sodium: 141 mEq/L (ref 137–147)

## 2014-06-29 LAB — LIPID PANEL
CHOLESTEROL: 127 mg/dL (ref 0–200)
HDL: 40 mg/dL (ref >39)
LDL Cholesterol: 58 mg/dL (ref 0–99)
Total CHOL/HDL Ratio: 3.2 RATIO
Triglycerides: 143 mg/dL (ref <150)
VLDL: 29 mg/dL (ref 0–40)

## 2014-06-29 LAB — CBC
HCT: 38.4 % — ABNORMAL LOW (ref 39.0–52.0)
Hemoglobin: 12.6 g/dL — ABNORMAL LOW (ref 13.0–17.0)
MCH: 31.3 pg (ref 26.0–34.0)
MCHC: 32.8 g/dL (ref 30.0–36.0)
MCV: 95.5 fL (ref 78.0–100.0)
PLATELETS: 193 10*3/uL (ref 150–400)
RBC: 4.02 MIL/uL — AB (ref 4.22–5.81)
RDW: 13.3 % (ref 11.5–15.5)
WBC: 5.5 10*3/uL (ref 4.0–10.5)

## 2014-06-29 LAB — GLUCOSE, CAPILLARY
GLUCOSE-CAPILLARY: 66 mg/dL — AB (ref 70–99)
GLUCOSE-CAPILLARY: 85 mg/dL (ref 70–99)
Glucose-Capillary: 81 mg/dL (ref 70–99)

## 2014-06-29 MED ORDER — GLUCOSE 40 % PO GEL
ORAL | Status: AC
  Administered 2014-06-29: 37.5 g
  Filled 2014-06-29: qty 1

## 2014-06-29 MED ORDER — AMLODIPINE BESYLATE 10 MG PO TABS: 5.0000 mg | ORAL_TABLET | Freq: Every day | ORAL | Status: DC

## 2014-06-29 MED ORDER — TECHNETIUM TC 99M SESTAMIBI - CARDIOLITE
10.0000 | Freq: Once | INTRAVENOUS | Status: AC | PRN
  Administered 2014-06-29: 10:00:00 10 via INTRAVENOUS

## 2014-06-29 MED ORDER — AMLODIPINE BESYLATE 5 MG PO TABS: 5.0000 mg | ORAL_TABLET | Freq: Every day | ORAL | Status: DC

## 2014-06-29 MED ORDER — REGADENOSON 0.4 MG/5ML IV SOLN
INTRAVENOUS | Status: AC
  Filled 2014-06-29: qty 5

## 2014-06-29 MED ORDER — REGADENOSON 0.4 MG/5ML IV SOLN
0.4000 mg | Freq: Once | INTRAVENOUS | Status: AC
  Administered 2014-06-29: 0.4 mg via INTRAVENOUS
  Filled 2014-06-29: qty 5

## 2014-06-29 MED ORDER — GABAPENTIN 100 MG PO CAPS: 100.0000 mg | ORAL_CAPSULE | Freq: Three times a day (TID) | ORAL | Status: DC

## 2014-06-29 MED ORDER — TECHNETIUM TC 99M SESTAMIBI - CARDIOLITE
30.0000 | Freq: Once | INTRAVENOUS | Status: AC | PRN
  Administered 2014-06-29: 30 via INTRAVENOUS

## 2014-06-29 MED ORDER — ASPIRIN 81 MG PO TBEC: 81.0000 mg | DELAYED_RELEASE_TABLET | Freq: Every day | ORAL | Status: DC

## 2014-06-29 MED ORDER — ASPIRIN EC 81 MG PO TBEC: 81.0000 mg | DELAYED_RELEASE_TABLET | Freq: Every day | ORAL | Status: DC

## 2014-06-29 NOTE — Discharge Summary (Signed)
Physician Discharge Summary  Patient ID: Dustin [name] MRN: [id number] DOB/AGE: [id number] 78 y.o.  Admit date: 06/27/2014 Discharge date: 06/29/2014  Primary Care Physician:  Glo Herring., MD  Discharge Diagnoses:    . Chest pain . Left-sided weakness likely TIA  . HYPERLIPIDEMIA-MIXED . HYPERTENSION, BENIGN . Coronary atherosclerosis of native coronary artery . OBSTRUCTIVE SLEEP APNEA   Consults:  Cardiology, Dr Domenic Polite    Allergies:  No Known Allergies   Discharge Medications:   Medication List    STOP taking these medications       carvedilol 3.125 MG tablet  Commonly known as:  COREG      TAKE these medications       ALPRAZolam 1 MG tablet  Commonly known as:  XANAX  Take 1 mg by mouth daily as needed. For anxiety     amLODipine 10 MG tablet  Commonly known as:  NORVASC  Take 0.5 tablets (5 mg total) by mouth daily.     aspirin EC 81 MG tablet  Take 1 tablet (81 mg total) by mouth daily.     clopidogrel 75 MG tablet  Commonly known as:  PLAVIX  Take 75 mg by mouth daily.     donepezil 10 MG tablet  Commonly known as:  ARICEPT  Take 1 tablet by mouth At bedtime.     escitalopram 10 MG tablet  Commonly known as:  LEXAPRO  Take 10 mg by mouth daily.     fentaNYL 100 MCG/HR  Commonly known as:  DURAGESIC - dosed mcg/hr  Place 1 patch onto the skin every 3 (three) days.     gabapentin 100 MG capsule  Commonly known as:  NEURONTIN  Take 1 capsule (100 mg total) by mouth 3 (three) times daily.     hydrochlorothiazide 12.5 MG capsule  Commonly known as:  MICROZIDE  Take 12.5 mg by mouth daily.     lansoprazole 30 MG capsule  Commonly known as:  PREVACID  Take 30 mg by mouth daily.     levothyroxine 25 MCG tablet  Commonly known as:  SYNTHROID, LEVOTHROID  Take 25 mcg by mouth daily.     NAMENDA 5 MG tablet  Generic drug:  memantine  Take 5 mg by mouth daily.     nitroGLYCERIN 0.4 MG SL tablet  Commonly known as:  NITROSTAT   Place 1 tablet (0.4 mg total) under the tongue every 5 (five) minutes as needed. For chest pains     oxyCODONE 15 MG immediate release tablet  Commonly known as:  ROXICODONE  Take 15 mg by mouth every 6 (six) hours as needed for pain.     pravastatin 40 MG tablet  Commonly known as:  PRAVACHOL  TAKE (1) TABLET BY MOUTH AT BEDTIME.         Brief H and P: For complete details please refer to admission H and P, but in briefThomas E [name] is a 80 y.o. male with a history of CAD presented to [facility] ER with complaints of chest pain since Monday. Patient states that pain is located on the left side of his chest and seemed to radiate into his left arm. In addition he states that he has had numbness in the arm as well as the left leg. Patient has had some weakness noted. He states that he took nitroglycerin and aspirin and this seemed to have resolved his pain. When he came into the ED he was pain free. Patient states that he thinks he  may have gotten too hot as he has been busy working outdoors. Patient stated that he has had no headaches no dizziness no syncope. He denied having focal motor deficits. Patient states that he has had a CABG and cath in the past and is followed by [facility].   Hospital Course:  Chest pain : atypical but given strong history of CAD, concerning for angina - Patient was admitted to telemetry at [facility], ruled out for acute ACS with negative cardiac markers. EKG showed some T wave inversions in the inferior leads. Echocardiogram was done which showed EF of 50% with no regional wall motion abnormalities. Patient has significant history of coronary artery disease with CABG, last cath in 2012 when DES was placed in SVG to PDA. Given his strong history of CAD, patient was transferred to [facility] Patient was seen by cardiology, Dr Domenic Polite and recommended nuclear medicine stress test for ischemic evaluation.  Stress test showed EF 48% but no  reversible ischemia. Continue aspirin, Plavix, Zocor. Patient was noted to have bradycardia hence Coreg is discontinued and amlodipine was decreased to 5 mg daily.    Left-sided weakness with paresthesias; likely due to TIA  - Patient had reported symptoms of left upper and lower extremity numbness and tingling and weakness for several days but had already improved prior to admission. Patient underwent CT head which showed no evidence of acute intracranial abnormality.  - MRI of the brain was obtained which showed no acute finding, mild chronic small vessel changes of the cerebral hemispheric white matter, normal MRA of large and medium-sized vessels.  - Carotid Dopplers showed no internal carotid artery stenosis.  2-D echo showed EF of 50%, mild LVH, grade 1dysfunction  Continue aspirin, Plavix and statins  Patient has no trouble swallowing or ambulating, he is back to baseline status.  HYPERLIPIDEMIA-MIXED  - Continue statins   OBSTRUCTIVE SLEEP APNEA  - Patient refused to be wear CPAP   HYPERTENSION, BENIGN  - Stable   Day of Discharge BP 122/49  Pulse 56  Temp(Src) 98 F (36.7 C) (Oral)  Resp 18  Ht 5\' 9"  (1.753 m)  Wt 81.76 kg (180 lb 4 oz)  BMI 26.61 kg/m2  SpO2 99%  Physical Exam: General: Alert and awake oriented x3 not in any acute distress. HEENT: anicteric sclera, pupils reactive to light and accommodation CVS: S1-S2 clear no murmur rubs or gallops Chest: clear to auscultation bilaterally, no wheezing rales or rhonchi Abdomen: soft nontender, nondistended, normal bowel sounds Extremities: no cyanosis, clubbing or edema noted bilaterally Neuro: Cranial nerves II-XII intact, no focal neurological deficits   The results of significant diagnostics from this hospitalization (including imaging, microbiology, ancillary and laboratory) are listed below for reference.    LAB RESULTS: Basic Metabolic Panel:  Recent Labs Lab 06/27/14 1638 06/29/14 0308  NA 140 141   K 4.0 3.8  CL 99 100  CO2 30 30  GLUCOSE 148* 95  BUN 14 10  CREATININE 0.74 0.64  CALCIUM 9.3 8.7   Liver Function Tests: No results found for this basename: AST, ALT, ALKPHOS, BILITOT, PROT, ALBUMIN,  in the last 168 hours No results found for this basename: LIPASE, AMYLASE,  in the last 168 hours No results found for this basename: AMMONIA,  in the last 168 hours CBC:  Recent Labs Lab 06/27/14 1638 06/29/14 0308  WBC 7.1 5.5  NEUTROABS 5.0  --   HGB 14.0 12.6*  HCT 41.8 38.4*  MCV 95.4 95.5  PLT 220 193   Cardiac Enzymes:  Recent Labs Lab 06/27/14 2254 06/28/14 0146  TROPONINI <0.30 <0.30   BNP: No components found with this basename: POCBNP,  CBG:  Recent Labs Lab 06/29/14 0822 06/29/14 1656  GLUCAP 85 81    Significant Diagnostic Studies:  Ct Head Wo Contrast  06/27/2014   CLINICAL DATA:  Intermittent chest pain, headache  EXAM: CT HEAD WITHOUT CONTRAST  TECHNIQUE: Contiguous axial images were obtained from the base of the skull through the vertex without intravenous contrast.  COMPARISON:  MRI brain dated 03/09/2012  FINDINGS: No evidence of parenchymal hemorrhage or extra-axial fluid collection. No mass lesion, mass effect, or midline shift.  No CT evidence of acute infarction.  Mild Subcortical white matter and periventricular small vessel ischemic changes. Intracranial atherosclerosis.  Mild age related atrophy.  No ventriculomegaly.  Mild mucosal thickening in the bilateral ethmoid sinuses. The mastoid air cells are unopacified.  No evidence of calvarial fracture.  IMPRESSION: No evidence of acute intracranial abnormality.   Electronically Signed   By: Julian Hy M.D.   On: 06/27/2014 18:04   Mr Jodene Nam Head Wo Contrast  06/28/2014   CLINICAL DATA:  Weakness.  History of dementia.  EXAM: MRI HEAD WITHOUT CONTRAST  MRA HEAD WITHOUT CONTRAST  TECHNIQUE: Multiplanar, multiecho pulse sequences of the brain and surrounding structures were obtained without  intravenous contrast. Angiographic images of the head were obtained using MRA technique without contrast.  COMPARISON:  Head CT 06/27/2014.  MRI 03/09/2012.  FINDINGS: MRI HEAD FINDINGS  Diffusion imaging does not show any acute or subacute infarction. The brainstem and cerebellum are normal. The cerebral hemispheres show mild chronic small-vessel change within the deep and subcortical white matter, less than often seen in healthy individuals of this age. No cortical or large vessel territory insult. No mass lesion, hemorrhage, hydrocephalus or extra-axial collection. No pituitary mass. No inflammatory sinus disease. No skull or skullbase lesion.  MRA HEAD FINDINGS  Both internal carotid arteries are widely patent into the brain. The anterior and middle cerebral vessels are patent without proximal stenosis, aneurysm or vascular malformation.  Both vertebral arteries are widely patent to the basilar. No basilar stenosis. Posterior circulation branch vessels are normal.  IMPRESSION: No acute finding.  Mild chronic small-vessel change of the cerebral hemispheric white matter.  Normal intracranial MR angiography of the large and medium size vessels.   Electronically Signed   By: Nelson Chimes M.D.   On: 06/28/2014 18:56   Mr Brain Wo Contrast  06/28/2014   CLINICAL DATA:  Weakness.  History of dementia.  EXAM: MRI HEAD WITHOUT CONTRAST  MRA HEAD WITHOUT CONTRAST  TECHNIQUE: Multiplanar, multiecho pulse sequences of the brain and surrounding structures were obtained without intravenous contrast. Angiographic images of the head were obtained using MRA technique without contrast.  COMPARISON:  Head CT 06/27/2014.  MRI 03/09/2012.  FINDINGS: MRI HEAD FINDINGS  Diffusion imaging does not show any acute or subacute infarction. The brainstem and cerebellum are normal. The cerebral hemispheres show mild chronic small-vessel change within the deep and subcortical white matter, less than often seen in healthy individuals of this  age. No cortical or large vessel territory insult. No mass lesion, hemorrhage, hydrocephalus or extra-axial collection. No pituitary mass. No inflammatory sinus disease. No skull or skullbase lesion.  MRA HEAD FINDINGS  Both internal carotid arteries are widely patent into the brain. The anterior and middle cerebral vessels are patent without proximal stenosis, aneurysm or vascular malformation.  Both vertebral  arteries are widely patent to the basilar. No basilar stenosis. Posterior circulation branch vessels are normal.  IMPRESSION: No acute finding.  Mild chronic small-vessel change of the cerebral hemispheric white matter.  Normal intracranial MR angiography of the large and medium size vessels.   Electronically Signed   By: Nelson Chimes M.D.   On: 06/28/2014 18:56   US Carotid Bilateral  06/28/2014   CLINICAL DATA:  Left-sided numbness  EXAM: BILATERAL CAROTID DUPLEX ULTRASOUND  TECHNIQUE: Pearline Cables scale imaging, color Doppler and duplex ultrasound were performed of bilateral carotid and vertebral arteries in the neck.  COMPARISON:  None.  FINDINGS: Criteria: Quantification of carotid stenosis is based on velocity parameters that correlate the residual internal carotid diameter with NASCET-based stenosis levels, using the diameter of the distal internal carotid lumen as the denominator for stenosis measurement.  The following velocity measurements were obtained:  RIGHT  ICA:  61/17 cm/sec  CCA:  76/16 cm/sec  SYSTOLIC ICA/CCA RATIO:  0.73  DIASTOLIC ICA/CCA RATIO:  1.5  ECA:  218 cm/sec  LEFT  ICA:  102/29 cm/sec  CCA:  71/0 cm/sec  SYSTOLIC ICA/CCA RATIO:  6.26  DIASTOLIC ICA/CCA RATIO:  3.2  ECA:  70 cm/sec  RIGHT CAROTID ARTERY: Calcific plaque is noted in the common and internal carotid artery on the right. The waveforms, velocities and flow velocity ratios however demonstrate no evidence of focal hemodynamically significant stenosis. Elevation of the external carotid artery velocity is noted suggestive of a  degree of narrowing.  RIGHT VERTEBRAL ARTERY:  Antegrade  LEFT CAROTID ARTERY: Diffuse calcific plaque is noted. The waveforms, velocities and flow velocity ratios however demonstrate no evidence of focal hemodynamically significant stenosis.  LEFT VERTEBRAL ARTERY:  Antegrade in nature.  IMPRESSION: No internal carotid artery stenosis is noted.  Bilateral calcific plaque is seen.  Elevated velocities suggestive of a stenosis in the right external carotid artery.   Electronically Signed   By: Inez Catalina M.D.   On: 06/28/2014 11:35   Dg Chest Port 1 View  06/27/2014   CLINICAL DATA:  Chest pain.  EXAM: PORTABLE CHEST - 1 VIEW  COMPARISON:  03/11/2014  FINDINGS: Changes from CABG surgery are stable. Cardiac silhouette is borderline enlarged. Normal mediastinal and hilar contours.  Lungs are clear.  No pleural effusion or pneumothorax.  Bony thorax is demineralized but grossly intact.  IMPRESSION: No acute cardiopulmonary disease.   Electronically Signed   By: Lajean Manes M.D.   On: 06/27/2014 16:58    2D ECHO: Study Conclusions  - Left ventricle: Systolic function is low normal, EF 50%. The cavity size was mildly dilated. Wall thickness was increased in a pattern of mild LVH. There was an increased relative contribution of atrial contraction to ventricular filling. Doppler parameters are consistent with abnormal left ventricular relaxation (grade 1 diastolic dysfunction). Doppler parameters are consistent with high ventricular filling pressure. - Aortic valve: Mildly calcified annulus. Trileaflet; mildly thickened leaflets. There was no stenosis. - Aorta: Borderline aortic root dilatation. 3.9 cm. - Mitral valve: There was moderate regurgitation. - Left atrium: The atrium was mildly dilated. - Right ventricle: Systolic function was mildly reduced. - Tricuspid valve: There was mild-moderate regurgitation. - Pulmonary arteries: PA peak pressure: 31 mm Hg (S   Disposition and  Follow-up:    DISPOSITION: home  DIET: heart healthy   DISCHARGE FOLLOW-UP Follow-up Information   Follow up with Glo Herring., MD. Schedule an appointment as soon as possible for a visit in 10 days. (for hospital  follow-up)    Specialty:  Internal Medicine   Contact information:   [address] [place] [address] [phone]       Follow up with Rozann Lesches, MD. Schedule an appointment as soon as possible for a visit in 2 weeks. (for hospital follow-up)    Specialty:  Cardiology   Contact information:   [place] [address] [phone]       Time spent on Discharge: 40 mins  Signed:   RAI,RIPUDEEP M.D. [name] 06/29/2014, 5:21 PM Pager: [phone]   **Disclaimer: This note was dictated with voice recognition software. Similar sounding words can inadvertently be transcribed and this note may contain transcription errors which may not have been corrected upon publication of note.**

## 2014-06-29 NOTE — Progress Notes (Signed)
Getting NUC stress test.   Candee Furbish, MD

## 2014-06-29 NOTE — Progress Notes (Signed)
Lexiscan CL performed 

## 2014-06-29 NOTE — Progress Notes (Signed)
Patient Name: Dustin [name] Date of Encounter: 06/29/2014  Principal Problem:   Chest pain Active Problems:   HYPERLIPIDEMIA-MIXED   OBSTRUCTIVE SLEEP APNEA   HYPERTENSION, BENIGN   Coronary atherosclerosis of native coronary artery   Left-sided weakness    Patient Profile: 80 yo male with hx CAD/CABG in 2006, was admitted with possible TIA. Having intermittent chest discomfort and left arm numbness  SUBJECTIVE: Had some chest pain earlier, left arm and chest have improved.  OBJECTIVE Filed Vitals:   06/28/14 2352 06/29/14 0400 06/29/14 0801 06/29/14 1109  BP: 123/51 130/53 120/56 122/56  Pulse: 48 44 48 50  Temp: 97.3 F (36.3 C) 98.3 F (36.8 C) 97.2 F (36.2 C)   TempSrc: Oral Oral Oral   Resp: 18 18 18 18   Height:      Weight:      SpO2: 97% 93% 92%     Intake/Output Summary (Last 24 hours) at 06/29/14 1120 Last data filed at 06/28/14 1355  Gross per 24 hour  Intake    240 ml  Output      0 ml  Net    240 ml   Filed Weights   06/27/14 1638 06/27/14 1945 06/28/14 0446  Weight: 180 lb (81.647 kg) 180 lb (81.647 kg) 180 lb 4 oz (81.76 kg)    PHYSICAL EXAM General: Well developed, well nourished, male in no acute distress. Head: Normocephalic, atraumatic.  Neck: Supple without bruits, JVD not elevated. Lungs:  Resp regular and unlabored, CTA. Heart: RRR, S1, S2, no S3, S4, soft murmur; no rub. Abdomen: Soft, non-tender, non-distended, BS + x 4.  Extremities: No clubbing, cyanosis, no edema.  Neuro: Alert and oriented X 3. Moves all extremities spontaneously. Psych: Normal affect.  LABS: CBC: Recent Labs  06/27/14 1638 06/29/14 0308  WBC 7.1 5.5  NEUTROABS 5.0  --   HGB 14.0 12.6*  HCT 41.8 38.4*  MCV 95.4 95.5  PLT 220 354   Basic Metabolic Panel: Recent Labs  06/27/14 1638 06/29/14 0308  NA 140 141  K 4.0 3.8  CL 99 100  CO2 30 30  GLUCOSE 148* 95  BUN 14 10  CREATININE 0.74 0.64  CALCIUM 9.3 8.7   Cardiac Enzymes: Recent  Labs  06/27/14 2011 06/27/14 2254 06/28/14 0146  TROPONINI <0.30 <0.30 <0.30   Hemoglobin A1C: Recent Labs  06/28/14 0146  HGBA1C 5.8*   Fasting Lipid Panel: Recent Labs  06/29/14 0308  CHOL 127  HDL 40  LDLCALC 58  TRIG 143  CHOLHDL 3.2    TELE:  [employer] LBBB is old, seen in Nuc Med      Radiology/Studies: Ct Head Wo Contrast  06/27/2014   CLINICAL DATA:  Intermittent chest pain, headache  EXAM: CT HEAD WITHOUT CONTRAST  TECHNIQUE: Contiguous axial images were obtained from the base of the skull through the vertex without intravenous contrast.  COMPARISON:  MRI brain dated 03/09/2012  FINDINGS: No evidence of parenchymal hemorrhage or extra-axial fluid collection. No mass lesion, mass effect, or midline shift.  No CT evidence of acute infarction.  Mild Subcortical white matter and periventricular small vessel ischemic changes. Intracranial atherosclerosis.  Mild age related atrophy.  No ventriculomegaly.  Mild mucosal thickening in the bilateral ethmoid sinuses. The mastoid air cells are unopacified.  No evidence of calvarial fracture.  IMPRESSION: No evidence of acute intracranial abnormality.   Electronically Signed   By: Julian Hy M.D.   On: 06/27/2014 18:04   Mr Jodene Nam  Head Wo Contrast  06/28/2014   CLINICAL DATA:  Weakness.  History of dementia.  EXAM: MRI HEAD WITHOUT CONTRAST  MRA HEAD WITHOUT CONTRAST  TECHNIQUE: Multiplanar, multiecho pulse sequences of the brain and surrounding structures were obtained without intravenous contrast. Angiographic images of the head were obtained using MRA technique without contrast.  COMPARISON:  Head CT 06/27/2014.  MRI 03/09/2012.  FINDINGS: MRI HEAD FINDINGS  Diffusion imaging does not show any acute or subacute infarction. The brainstem and cerebellum are normal. The cerebral hemispheres show mild chronic small-vessel change within the deep and subcortical white matter, less than often seen in healthy individuals of this age. No  cortical or large vessel territory insult. No mass lesion, hemorrhage, hydrocephalus or extra-axial collection. No pituitary mass. No inflammatory sinus disease. No skull or skullbase lesion.  MRA HEAD FINDINGS  Both internal carotid arteries are widely patent into the brain. The anterior and middle cerebral vessels are patent without proximal stenosis, aneurysm or vascular malformation.  Both vertebral arteries are widely patent to the basilar. No basilar stenosis. Posterior circulation branch vessels are normal.  IMPRESSION: No acute finding.  Mild chronic small-vessel change of the cerebral hemispheric white matter.  Normal intracranial MR angiography of the large and medium size vessels.   Electronically Signed   By: Nelson Chimes M.D.   On: 06/28/2014 18:56   Mr Brain Wo Contrast  06/28/2014   CLINICAL DATA:  Weakness.  History of dementia.  EXAM: MRI HEAD WITHOUT CONTRAST  MRA HEAD WITHOUT CONTRAST  TECHNIQUE: Multiplanar, multiecho pulse sequences of the brain and surrounding structures were obtained without intravenous contrast. Angiographic images of the head were obtained using MRA technique without contrast.  COMPARISON:  Head CT 06/27/2014.  MRI 03/09/2012.  FINDINGS: MRI HEAD FINDINGS  Diffusion imaging does not show any acute or subacute infarction. The brainstem and cerebellum are normal. The cerebral hemispheres show mild chronic small-vessel change within the deep and subcortical white matter, less than often seen in healthy individuals of this age. No cortical or large vessel territory insult. No mass lesion, hemorrhage, hydrocephalus or extra-axial collection. No pituitary mass. No inflammatory sinus disease. No skull or skullbase lesion.  MRA HEAD FINDINGS  Both internal carotid arteries are widely patent into the brain. The anterior and middle cerebral vessels are patent without proximal stenosis, aneurysm or vascular malformation.  Both vertebral arteries are widely patent to the basilar. No  basilar stenosis. Posterior circulation branch vessels are normal.  IMPRESSION: No acute finding.  Mild chronic small-vessel change of the cerebral hemispheric white matter.  Normal intracranial MR angiography of the large and medium size vessels.   Electronically Signed   By: Nelson Chimes M.D.   On: 06/28/2014 18:56   US Carotid Bilateral  06/28/2014   CLINICAL DATA:  Left-sided numbness  EXAM: BILATERAL CAROTID DUPLEX ULTRASOUND  TECHNIQUE: Pearline Cables scale imaging, color Doppler and duplex ultrasound were performed of bilateral carotid and vertebral arteries in the neck.  COMPARISON:  None.  FINDINGS: Criteria: Quantification of carotid stenosis is based on velocity parameters that correlate the residual internal carotid diameter with NASCET-based stenosis levels, using the diameter of the distal internal carotid lumen as the denominator for stenosis measurement.  The following velocity measurements were obtained:  RIGHT  ICA:  61/17 cm/sec  CCA:  63/84 cm/sec  SYSTOLIC ICA/CCA RATIO:  5.36  DIASTOLIC ICA/CCA RATIO:  1.5  ECA:  218 cm/sec  LEFT  ICA:  102/29 cm/sec  CCA:  46/8 cm/sec  SYSTOLIC  ICA/CCA RATIO:  3.71  DIASTOLIC ICA/CCA RATIO:  3.2  ECA:  70 cm/sec  RIGHT CAROTID ARTERY: Calcific plaque is noted in the common and internal carotid artery on the right. The waveforms, velocities and flow velocity ratios however demonstrate no evidence of focal hemodynamically significant stenosis. Elevation of the external carotid artery velocity is noted suggestive of a degree of narrowing.  RIGHT VERTEBRAL ARTERY:  Antegrade  LEFT CAROTID ARTERY: Diffuse calcific plaque is noted. The waveforms, velocities and flow velocity ratios however demonstrate no evidence of focal hemodynamically significant stenosis.  LEFT VERTEBRAL ARTERY:  Antegrade in nature.  IMPRESSION: No internal carotid artery stenosis is noted.  Bilateral calcific plaque is seen.  Elevated velocities suggestive of a stenosis in the right external carotid  artery.   Electronically Signed   By: Inez Catalina M.D.   On: 06/28/2014 11:35   Dg Chest Port 1 View 06/27/2014   CLINICAL DATA:  Chest pain.  EXAM: PORTABLE CHEST - 1 VIEW  COMPARISON:  03/11/2014  FINDINGS: Changes from CABG surgery are stable. Cardiac silhouette is borderline enlarged. Normal mediastinal and hilar contours.  Lungs are clear.  No pleural effusion or pneumothorax.  Bony thorax is demineralized but grossly intact.  IMPRESSION: No acute cardiopulmonary disease.   Electronically Signed   By: Lajean Manes M.D.   On: 06/27/2014 16:58     Current Medications:  . [START ON 06/30/2014] amLODipine  5 mg Oral Daily  . antiseptic oral rinse  15 mL Mouth Rinse BID  . aspirin EC  81 mg Oral Daily  . clopidogrel  75 mg Oral Q breakfast  . donepezil  10 mg Oral QHS  . escitalopram  10 mg Oral Daily  . fentaNYL  100 mcg Transdermal Q72H  . gabapentin  100 mg Oral Daily  . heparin  5,000 Units Subcutaneous 3 times per day  . hydrochlorothiazide  12.5 mg Oral Daily  . levothyroxine  25 mcg Oral QAC breakfast  . memantine  5 mg Oral Daily  . nitroGLYCERIN  1 inch Topical 4 times per day  . pantoprazole  40 mg Oral Daily  . regadenoson      . regadenoson  0.4 mg Intravenous Once  . simvastatin  20 mg Oral q1800   . sodium chloride 50 mL/hr at 06/27/14 2117    ASSESSMENT AND PLAN: Principal Problem:   Chest pain - cardiac enzymes negative for MI, for Lexiscan today since MRI was OK, to assess for ischemia. On ASA, Plavix, statin. Not on BB due to underlying bradycardia.  Otherwise, per IM. Active Problems:   HYPERLIPIDEMIA-MIXED   OBSTRUCTIVE SLEEP APNEA   HYPERTENSION, BENIGN   Coronary atherosclerosis of native coronary artery   Left-sided weakness  Signed, Rosaria Ferries , PA-C 11:20 AM 06/29/2014

## 2014-06-29 NOTE — Progress Notes (Signed)
SLP Cancellation Note  Patient Details Name: JEFERSON BOOZER MRN: [id number] DOB: 02/29/1934   Cancelled treatment:        SLP attempted to see pt for SLE for 2 attempts without success; pt OOF both attempts for procedures; will f/u 06/30/14   Minka Knight,PAT, M.S., CCC-SLP 06/29/2014, 1:20 PM

## 2014-06-29 NOTE — Progress Notes (Signed)
Patient ID: Dustin [name]  male  YWV:[id number]    DOB: 1934/09/24    DOA: 06/27/2014  PCP: Glo Herring., MD  Assessment/Plan: Principal Problem:   Chest pain - Patient was admitted to telemetry at [facility], ruled out for acute ACS with negative cardiac markers. EKG showed some T wave inversions in the inferior leads. Echocardiogram was done which showed EF of 50% with no regional wall motion abnormalities. Patient has significant history of coronary artery disease with CABG, last cath in 2012 when DES was placed in SVG to PDA. Given his strong history of CAD, patient was transferred to [facility]. Patient was seen by cardiology and recommended nuclear medicine stress test for ischemic evaluation. - Continue aspirin, Plavix, Zocor. Patient was noted to have bradycardia hence Coreg is currently held and amlodipine was decreased to 5 mg daily.  Active Problems: Left-sided weakness with paresthesias; likely due to TIA - Patient had reported symptoms of left upper and lower extremity numbness and tingling and weakness for several days but had already improved prior to admission. Patient underwent CT head which showed no evidence of acute intracranial abnormality. - MRI of the brain was obtained which showed no acute finding, mild chronic small vessel changes of the cerebral hemispheric white matter, normal MRA of large and medium-sized vessels. - Carotid Dopplers showed no internal carotid artery stenosis. 2-D echo showed EF of 50%, mild LVH, grade 1dysfunction Continue aspirin, Plavix and statins PT, OT pending    HYPERLIPIDEMIA-MIXED - Continue statins    OBSTRUCTIVE SLEEP APNEA - Patient refuses to be wear CPAP    HYPERTENSION, BENIGN - Stable   DVT Prophylaxis:  Code Status: full  Family Communication:  Disposition: await stress test results  Consultants:  cardiology  Procedures:  Stress test   Antibiotics:  None    Subjective:  Patient  seen and examined, denies any chest pain at this time, no left-sided weakness or numbness at this time  Objective: Weight change:   Intake/Output Summary (Last 24 hours) at 06/29/14 1016 Last data filed at 06/28/14 1355  Gross per 24 hour  Intake    240 ml  Output      0 ml  Net    240 ml   Blood pressure 120/56, pulse 48, temperature 97.2 F (36.2 C), temperature source Oral, resp. rate 18, height 5\' 9"  (1.753 m), weight 81.76 kg (180 lb 4 oz), SpO2 92.00%.  Physical Exam: General: Alert and awake, oriented x3, not in any acute distress. CVS: S1-S2 clear, no murmur rubs or gallops Chest: clear to auscultation bilaterally, no wheezing, rales or rhonchi Abdomen: soft nontender, nondistended, normal bowel sounds  Extremities: no cyanosis, clubbing or edema noted bilaterally Neuro: Cranial nerves II-XII intact, no focal neurological deficits  Lab Results: Basic Metabolic Panel:  Recent Labs Lab 06/27/14 1638 06/29/14 0308  NA 140 141  K 4.0 3.8  CL 99 100  CO2 30 30  GLUCOSE 148* 95  BUN 14 10  CREATININE 0.74 0.64  CALCIUM 9.3 8.7   Liver Function Tests: No results found for this basename: AST, ALT, ALKPHOS, BILITOT, PROT, ALBUMIN,  in the last 168 hours No results found for this basename: LIPASE, AMYLASE,  in the last 168 hours No results found for this basename: AMMONIA,  in the last 168 hours CBC:  Recent Labs Lab 06/27/14 1638 06/29/14 0308  WBC 7.1 5.5  NEUTROABS 5.0  --   HGB 14.0 12.6*  HCT 41.8 38.4*  MCV  95.4 95.5  PLT 220 193   Cardiac Enzymes:  Recent Labs Lab 06/27/14 2011 06/27/14 2254 06/28/14 0146  TROPONINI <0.30 <0.30 <0.30   BNP: No components found with this basename: POCBNP,  CBG:  Recent Labs Lab 06/29/14 0728 06/29/14 0822  GLUCAP 66* 85     Micro Results: No results found for this or any previous visit (from the past 240 hour(s)).  Studies/Results: Ct Head Wo Contrast  06/27/2014   CLINICAL DATA:  Intermittent chest  pain, headache  EXAM: CT HEAD WITHOUT CONTRAST  TECHNIQUE: Contiguous axial images were obtained from the base of the skull through the vertex without intravenous contrast.  COMPARISON:  MRI brain dated 03/09/2012  FINDINGS: No evidence of parenchymal hemorrhage or extra-axial fluid collection. No mass lesion, mass effect, or midline shift.  No CT evidence of acute infarction.  Mild Subcortical white matter and periventricular small vessel ischemic changes. Intracranial atherosclerosis.  Mild age related atrophy.  No ventriculomegaly.  Mild mucosal thickening in the bilateral ethmoid sinuses. The mastoid air cells are unopacified.  No evidence of calvarial fracture.  IMPRESSION: No evidence of acute intracranial abnormality.   Electronically Signed   By: Julian Hy M.D.   On: 06/27/2014 18:04   Mr Jodene Nam Head Wo Contrast  06/28/2014   CLINICAL DATA:  Weakness.  History of dementia.  EXAM: MRI HEAD WITHOUT CONTRAST  MRA HEAD WITHOUT CONTRAST  TECHNIQUE: Multiplanar, multiecho pulse sequences of the brain and surrounding structures were obtained without intravenous contrast. Angiographic images of the head were obtained using MRA technique without contrast.  COMPARISON:  Head CT 06/27/2014.  MRI 03/09/2012.  FINDINGS: MRI HEAD FINDINGS  Diffusion imaging does not show any acute or subacute infarction. The brainstem and cerebellum are normal. The cerebral hemispheres show mild chronic small-vessel change within the deep and subcortical white matter, less than often seen in healthy individuals of this age. No cortical or large vessel territory insult. No mass lesion, hemorrhage, hydrocephalus or extra-axial collection. No pituitary mass. No inflammatory sinus disease. No skull or skullbase lesion.  MRA HEAD FINDINGS  Both internal carotid arteries are widely patent into the brain. The anterior and middle cerebral vessels are patent without proximal stenosis, aneurysm or vascular malformation.  Both vertebral  arteries are widely patent to the basilar. No basilar stenosis. Posterior circulation branch vessels are normal.  IMPRESSION: No acute finding.  Mild chronic small-vessel change of the cerebral hemispheric white matter.  Normal intracranial MR angiography of the large and medium size vessels.   Electronically Signed   By: Nelson Chimes M.D.   On: 06/28/2014 18:56   Mr Brain Wo Contrast  06/28/2014   CLINICAL DATA:  Weakness.  History of dementia.  EXAM: MRI HEAD WITHOUT CONTRAST  MRA HEAD WITHOUT CONTRAST  TECHNIQUE: Multiplanar, multiecho pulse sequences of the brain and surrounding structures were obtained without intravenous contrast. Angiographic images of the head were obtained using MRA technique without contrast.  COMPARISON:  Head CT 06/27/2014.  MRI 03/09/2012.  FINDINGS: MRI HEAD FINDINGS  Diffusion imaging does not show any acute or subacute infarction. The brainstem and cerebellum are normal. The cerebral hemispheres show mild chronic small-vessel change within the deep and subcortical white matter, less than often seen in healthy individuals of this age. No cortical or large vessel territory insult. No mass lesion, hemorrhage, hydrocephalus or extra-axial collection. No pituitary mass. No inflammatory sinus disease. No skull or skullbase lesion.  MRA HEAD FINDINGS  Both internal carotid arteries are widely patent  into the brain. The anterior and middle cerebral vessels are patent without proximal stenosis, aneurysm or vascular malformation.  Both vertebral arteries are widely patent to the basilar. No basilar stenosis. Posterior circulation branch vessels are normal.  IMPRESSION: No acute finding.  Mild chronic small-vessel change of the cerebral hemispheric white matter.  Normal intracranial MR angiography of the large and medium size vessels.   Electronically Signed   By: Nelson Chimes M.D.   On: 06/28/2014 18:56   US Carotid Bilateral  06/28/2014   CLINICAL DATA:  Left-sided numbness  EXAM:  BILATERAL CAROTID DUPLEX ULTRASOUND  TECHNIQUE: Pearline Cables scale imaging, color Doppler and duplex ultrasound were performed of bilateral carotid and vertebral arteries in the neck.  COMPARISON:  None.  FINDINGS: Criteria: Quantification of carotid stenosis is based on velocity parameters that correlate the residual internal carotid diameter with NASCET-based stenosis levels, using the diameter of the distal internal carotid lumen as the denominator for stenosis measurement.  The following velocity measurements were obtained:  RIGHT  ICA:  61/17 cm/sec  CCA:  51/02 cm/sec  SYSTOLIC ICA/CCA RATIO:  5.85  DIASTOLIC ICA/CCA RATIO:  1.5  ECA:  218 cm/sec  LEFT  ICA:  102/29 cm/sec  CCA:  27/7 cm/sec  SYSTOLIC ICA/CCA RATIO:  8.24  DIASTOLIC ICA/CCA RATIO:  3.2  ECA:  70 cm/sec  RIGHT CAROTID ARTERY: Calcific plaque is noted in the common and internal carotid artery on the right. The waveforms, velocities and flow velocity ratios however demonstrate no evidence of focal hemodynamically significant stenosis. Elevation of the external carotid artery velocity is noted suggestive of a degree of narrowing.  RIGHT VERTEBRAL ARTERY:  Antegrade  LEFT CAROTID ARTERY: Diffuse calcific plaque is noted. The waveforms, velocities and flow velocity ratios however demonstrate no evidence of focal hemodynamically significant stenosis.  LEFT VERTEBRAL ARTERY:  Antegrade in nature.  IMPRESSION: No internal carotid artery stenosis is noted.  Bilateral calcific plaque is seen.  Elevated velocities suggestive of a stenosis in the right external carotid artery.   Electronically Signed   By: Inez Catalina M.D.   On: 06/28/2014 11:35   Dg Chest Port 1 View  06/27/2014   CLINICAL DATA:  Chest pain.  EXAM: PORTABLE CHEST - 1 VIEW  COMPARISON:  03/11/2014  FINDINGS: Changes from CABG surgery are stable. Cardiac silhouette is borderline enlarged. Normal mediastinal and hilar contours.  Lungs are clear.  No pleural effusion or pneumothorax.  Bony thorax  is demineralized but grossly intact.  IMPRESSION: No acute cardiopulmonary disease.   Electronically Signed   By: Lajean Manes M.D.   On: 06/27/2014 16:58    Medications: Scheduled Meds: . [START ON 06/30/2014] amLODipine  5 mg Oral Daily  . antiseptic oral rinse  15 mL Mouth Rinse BID  . aspirin EC  81 mg Oral Daily  . clopidogrel  75 mg Oral Q breakfast  . donepezil  10 mg Oral QHS  . escitalopram  10 mg Oral Daily  . fentaNYL  100 mcg Transdermal Q72H  . gabapentin  100 mg Oral Daily  . heparin  5,000 Units Subcutaneous 3 times per day  . hydrochlorothiazide  12.5 mg Oral Daily  . levothyroxine  25 mcg Oral QAC breakfast  . memantine  5 mg Oral Daily  . nitroGLYCERIN  1 inch Topical 4 times per day  . pantoprazole  40 mg Oral Daily  . simvastatin  20 mg Oral q1800      LOS: 2 days   RAI,RIPUDEEP M.D.  [name] 06/29/2014, 10:16 AM Pager: [phone]  If 7PM-7AM, please contact night-coverage [email or web address] Password [email or web address]  **Disclaimer: This note was dictated with voice recognition software. Similar sounding words can inadvertently be transcribed and this note may contain transcription errors which may not have been corrected upon publication of note.**

## 2014-06-29 NOTE — Progress Notes (Signed)
PT Cancellation Note  Patient Details Name: MAXON KRESSE MRN: [id number] DOB: 01/08/1934   Cancelled Treatment:    Reason Eval/Treat Not Completed: Patient at procedure or test/unavailable.  Attempted to see patient this am at 10:05.  Patient out of room for testing.  Will return at later time for PT evaluation.   Despina Pole 06/29/2014, 5:16 PM Carita Pian. Sanjuana Kava, [place] Pager [phone]

## 2014-07-03 NOTE — Progress Notes (Signed)
Agree with above. Dustin Selsor, MD  

## 2014-07-05 ENCOUNTER — Telehealth: Payer: Self-pay

## 2014-07-05 NOTE — Telephone Encounter (Addendum)
Pt called stating he had chest pain (somewhat pleuritic in description) this am and had taken two NTG with minimal relief. He feels his constipation and gas may be the source of his pain. He uses oxycodone daily and remains constipated. I spoke with Dr.McDowell regarding situation and we have reviewed his recent reassuring ischemic workup at [facility] and scheduled a follow up visit next week 07/11/14 at 2:10 pm with K.Lawrence NP. When I called back to speak with patient, wife answered and states Dustin [name] was asleep in bed. She has made him an apt later this month to see Dr.Fusco to discuss constipation. I discussed with her the recent cardiac work up he had received in the hospital. Also made it clear that he can always gt to the ED as an option if symptoms worsen. She stated that he had expressed to her he had no interest in coming to the hospital and she is satisfied with apt next week. She will call me if anything changes

## 2014-07-11 ENCOUNTER — Ambulatory Visit (INDEPENDENT_AMBULATORY_CARE_PROVIDER_SITE_OTHER): Payer: Medicare Other | Admitting: Adult Health

## 2014-07-11 ENCOUNTER — Encounter: Payer: Self-pay | Admitting: Adult Health

## 2014-07-11 VITALS — BP 110/60 | HR 54 | Ht 69.0 in | Wt 181.0 lb

## 2014-07-11 DIAGNOSIS — F0280 Dementia in other diseases classified elsewhere without behavioral disturbance: Secondary | ICD-10-CM

## 2014-07-11 DIAGNOSIS — I10 Essential (primary) hypertension: Secondary | ICD-10-CM

## 2014-07-11 DIAGNOSIS — G309 Alzheimer's disease, unspecified: Secondary | ICD-10-CM

## 2014-07-11 NOTE — Assessment & Plan Note (Signed)
Blood pressure is currently well-controlled. Will continue current medication regimen as directed. As stated I an concerned about his compliance, it appears he is compliant but has told me that he frequently forgets to take his medicines. He will followup with Dr. Domenic Polite in 3 months and bring his brother with him to this appointment. In the interim, I will have [facility] see him. I explained this to him and told him to expect a phone call to make that appointment

## 2014-07-11 NOTE — Assessment & Plan Note (Signed)
Dustin [name] is a difficult historian, but denies recurrent chest pain. I am very concerned about this man's overall health status, as I am uncertain that he will be able to maintain health maintenance with his worsening Alzheimer's and forgetfulness. The patient admits to sometimes forgetting to take his medicines, but he has a brother who he states comes by daily to check on them. I have asked him to have his brother make out his medications for each day says he will not forget to take them and ask them check on him about this. I have also asked him to bring his brother with him to the next appointment.  I am going to have [facility] est. contact with him to evaluate his needs, talk with his brother, and evaluate the necessity of home health vs. skilled nursing facility placement. This was not addressed on evaluation of his recent hospitalization records.

## 2014-07-11 NOTE — Assessment & Plan Note (Signed)
Close followup with his primary care physician is recommended. The patient states he is supposed to see him on July 30. I hope this is true, with ongoing management of this and need for more skilled care in the setting of an invalid wife at home.

## 2014-07-11 NOTE — Patient Instructions (Signed)
Your physician recommends that you schedule a follow-up appointment in: 3 months with Dr Domenic Polite   Your physician recommends that you continue on your current medications as directed. Please refer to the Current Medication list given to you today.  Please bring your brother at next visit.  You have been referred to  [employer]

## 2014-07-11 NOTE — Progress Notes (Deleted)
Name: Dustin [name]    DOB: [birth year]  Age: 80 y.o.  MR#: [id number]       PCP:  Glo Herring., MD      Insurance: Payor: [employer] MEDICARE / Plan: AARP MEDICARE COMPLETE / Product Type: *No Product type* /   CC:    Chief Complaint  Patient presents with  . Coronary Artery Disease    CABG  . Hypertension    VS Filed Vitals:   07/11/14 1421  BP: 110/60  Pulse: 54  Height: 5\' 9"  (1.753 m)  Weight: 181 lb (82.101 kg)    Weights Current Weight  07/11/14 181 lb (82.101 kg)  06/28/14 180 lb 4 oz (81.76 kg)  05/21/14 181 lb (82.101 kg)    Blood Pressure  BP Readings from Last 3 Encounters:  07/11/14 110/60  06/29/14 122/49  05/21/14 133/68     Admit date:  (Not on file) Last encounter with RMR:  Visit date not found   Allergy Review of patient's allergies indicates no known allergies.  Current Outpatient Prescriptions  Medication Sig Dispense Refill  . ALPRAZolam (XANAX) 1 MG tablet Take 1 mg by mouth daily as needed. For anxiety       . amLODipine (NORVASC) 10 MG tablet Take 0.5 tablets (5 mg total) by mouth daily.  30 tablet  6  . aspirin EC 81 MG tablet Take 1 tablet (81 mg total) by mouth daily.  30 tablet  3  . clopidogrel (PLAVIX) 75 MG tablet Take 75 mg by mouth daily.        [name] donepezil (ARICEPT) 10 MG tablet Take 1 tablet by mouth At bedtime.      [name] escitalopram (LEXAPRO) 10 MG tablet Take 10 mg by mouth daily.      . fentaNYL (DURAGESIC - DOSED MCG/HR) 100 MCG/HR Place 1 patch onto the skin every 3 (three) days.       [name] gabapentin (NEURONTIN) 100 MG capsule Take 1 capsule (100 mg total) by mouth 3 (three) times daily.  90 capsule  0  . hydrochlorothiazide (,MICROZIDE/HYDRODIURIL,) 12.5 MG capsule Take 12.5 mg by mouth daily.       [name] levothyroxine (SYNTHROID, LEVOTHROID) 25 MCG tablet Take 25 mcg by mouth daily.       [name] NAMENDA 5 MG tablet Take 5 mg by mouth daily.       . nitroGLYCERIN (NITROSTAT) 0.4 MG SL tablet Place 1 tablet (0.4 mg total)  under the tongue every 5 (five) minutes as needed. For chest pains  25 tablet  11  . oxyCODONE (ROXICODONE) 15 MG immediate release tablet Take 15 mg by mouth every 6 (six) hours as needed for pain.       . pravastatin (PRAVACHOL) 40 MG tablet TAKE (1) TABLET BY MOUTH AT BEDTIME.  30 tablet  6  . lansoprazole (PREVACID) 30 MG capsule Take 30 mg by mouth daily.       . traZODone (DESYREL) 50 MG tablet        No current facility-administered medications for this visit.    Discontinued Meds:   There are no discontinued medications.  Patient Active Problem List   Diagnosis Date Noted  . Left-sided weakness 06/28/2014  . Chest pain 06/27/2014  . Tubular adenoma of colon 03/05/2013  . Anorexia 03/05/2013  . Loss of weight 03/05/2013  . Chronic constipation 07/04/2012  . Bronchitis 08/16/2011  . HYPERLIPIDEMIA-MIXED 10/01/2009  . OBSTRUCTIVE SLEEP APNEA 10/01/2009  . [place] DISEASE 10/01/2009  . HYPERTENSION,  BENIGN 10/01/2009  . Coronary atherosclerosis of native coronary artery 10/01/2009    LABS    Component Value Date/Time   NA 141 06/29/2014 0308   NA 140 06/27/2014 1638   NA 139 03/11/2014 0105   NA 139 01/26/2013   K 3.8 06/29/2014 0308   K 4.0 06/27/2014 1638   K 3.4* 03/11/2014 0105   K 3.9 01/26/2013   CL 100 06/29/2014 0308   CL 99 06/27/2014 1638   CL 98 03/11/2014 0105   CL 100 01/26/2013   CO2 30 06/29/2014 0308   CO2 30 06/27/2014 1638   CO2 30 03/11/2014 0105   CO2 31 01/26/2013   GLUCOSE 95 06/29/2014 0308   GLUCOSE 148* 06/27/2014 1638   GLUCOSE 113* 03/11/2014 0105   BUN 10 06/29/2014 0308   BUN 14 06/27/2014 1638   BUN 27* 03/11/2014 0105   BUN 14 01/26/2013   CREATININE 0.64 06/29/2014 0308   CREATININE 0.74 06/27/2014 1638   CREATININE 1.01 03/11/2014 0105   CREATININE 0.80 01/26/2013   CREATININE 0.81 11/08/2012 1519   CREATININE 0.77 07/04/2012 1403   CALCIUM 8.7 06/29/2014 0308   CALCIUM 9.3 06/27/2014 1638   CALCIUM 8.7 03/11/2014 0105   GFRNONAA >90 06/29/2014 0308   GFRNONAA  87* 06/27/2014 1638   GFRNONAA 70* 03/11/2014 0105   GFRAA >90 06/29/2014 0308   GFRAA >90 06/27/2014 1638   GFRAA 81* 03/11/2014 0105   CMP     Component Value Date/Time   NA 141 06/29/2014 0308   NA 139 01/26/2013   K 3.8 06/29/2014 0308   K 3.9 01/26/2013   CL 100 06/29/2014 0308   CL 100 01/26/2013   CO2 30 06/29/2014 0308   CO2 31 01/26/2013   GLUCOSE 95 06/29/2014 0308   BUN 10 06/29/2014 0308   BUN 14 01/26/2013   CREATININE 0.64 06/29/2014 0308   CREATININE 0.80 01/26/2013   CALCIUM 8.7 06/29/2014 0308   PROT 6.7 01/26/2013   PROT 6.8 11/08/2012 1519   ALBUMIN 4.6 11/08/2012 1519   AST 16 01/26/2013   AST 32 11/08/2012 1519   ALT 13 01/26/2013   ALKPHOS 70 01/26/2013   ALKPHOS 60 11/08/2012 1519   BILITOT 0.8 01/26/2013   BILITOT 1.0 11/08/2012 1519   GFRNONAA >90 06/29/2014 0308   GFRAA >90 06/29/2014 0308       Component Value Date/Time   WBC 5.5 06/29/2014 0308   WBC 7.1 06/27/2014 1638   WBC 10.0 03/11/2014 0105   HGB 12.6* 06/29/2014 0308   HGB 14.0 06/27/2014 1638   HGB 13.3 03/11/2014 0105   HCT 38.4* 06/29/2014 0308   HCT 41.8 06/27/2014 1638   HCT 38.8* 03/11/2014 0105   HCT 43 01/26/2013   MCV 95.5 06/29/2014 0308   MCV 95.4 06/27/2014 1638   MCV 94.2 03/11/2014 0105    Lipid Panel     Component Value Date/Time   CHOL 127 06/29/2014 0308   TRIG 143 06/29/2014 0308   HDL 40 06/29/2014 0308   CHOLHDL 3.2 06/29/2014 0308   VLDL 29 06/29/2014 0308   LDLCALC 58 06/29/2014 0308    ABG    Component Value Date/Time   TCO2 31 07/06/2013 1804     Lab Results  Component Value Date   TSH 2.56 01/26/2013   BNP (last 3 results) No results found for this basename: PROBNP,  in the last 8760 hours Cardiac Panel (last 3 results) No results found for this basename: CKTOTAL, CKMB, TROPONINI, RELINDX,  in  the last 72 hours  Iron/TIBC/Ferritin/ %Sat No results found for this basename: iron, tibc, ferritin, ironpctsat     EKG Orders placed during the hospital encounter of 06/27/14  . EKG 12-LEAD  . EKG 12-LEAD   . EKG 12-LEAD  . EKG 12-LEAD  . EKG 12-LEAD  . EKG 12-LEAD  . EKG  . EKG 12-LEAD  . EKG 12-LEAD  . EXERCISE TOLERANCE TEST  . EXERCISE TOLERANCE TEST     Prior Assessment and Plan Problem List as of 07/11/2014     Cardiovascular and Mediastinum   HYPERTENSION, BENIGN   Last Assessment & Plan   05/21/2014 Office Visit Written 05/21/2014  2:14 PM by Satira Sark, MD     Keep followup with Dr. Gerarda Fraction.    Coronary atherosclerosis of native coronary artery   Last Assessment & Plan   05/21/2014 Office Visit Written 05/21/2014  2:14 PM by Satira Sark, MD     Stable angina symptoms on medical therapy. No changes made today. Continue observation.      Respiratory   OBSTRUCTIVE SLEEP APNEA   Last Assessment & Plan   12/17/2011 Office Visit Written 12/17/2011  3:15 PM by Hillary Bow, MD     Does not use CPAP.  Has one---but broken.    Bronchitis   Last Assessment & Plan   08/09/2011 Office Visit Written 08/16/2011 10:45 PM by Hillary Bow, MD     Had several day course of Azithromycin and is improved.  TS      Digestive   Chronic constipation   Last Assessment & Plan   11/08/2012 Office Visit Written 11/08/2012  3:39 PM by Mahala Menghini, PA     Start Amitiza 54mcg BID with food for opioid-induced constipation. Samples and RX provided.     Tubular adenoma of colon     Nervous and Auditory   [place] DISEASE   Last Assessment & Plan   07/24/2012 Office Visit Written 07/24/2012  2:48 PM by Hillary Bow, MD     His wife seem to imply that he was doing somewhat worse. His memory seems reasonably good, and he is a fairly good command of the discussion points we have them. He'll continue on his current medications    Left-sided weakness     Other   HYPERLIPIDEMIA-MIXED   Last Assessment & Plan   05/21/2014 Office Visit Written 05/21/2014  2:14 PM by Satira Sark, MD     He continues on Pravachol, followed by Dr. Gerarda Fraction.    Anorexia   Loss of weight    Last Assessment & Plan   03/15/2013 Office Visit Written 03/18/2013  9:38 PM by Hillary Bow, MD     Encouraged primary fu evaluation.     Chest pain       Imaging: Ct Head Wo Contrast  06/27/2014   CLINICAL DATA:  Intermittent chest pain, headache  EXAM: CT HEAD WITHOUT CONTRAST  TECHNIQUE: Contiguous axial images were obtained from the base of the skull through the vertex without intravenous contrast.  COMPARISON:  MRI brain dated 03/09/2012  FINDINGS: No evidence of parenchymal hemorrhage or extra-axial fluid collection. No mass lesion, mass effect, or midline shift.  No CT evidence of acute infarction.  Mild Subcortical white matter and periventricular small vessel ischemic changes. Intracranial atherosclerosis.  Mild age related atrophy.  No ventriculomegaly.  Mild mucosal thickening in the bilateral ethmoid sinuses. The mastoid air cells are unopacified.  No evidence of calvarial fracture.  IMPRESSION: No evidence of acute intracranial abnormality.   Electronically Signed   By: Julian Hy M.D.   On: 06/27/2014 18:04   Mr Jodene Nam Head Wo Contrast  06/28/2014   CLINICAL DATA:  Weakness.  History of dementia.  EXAM: MRI HEAD WITHOUT CONTRAST  MRA HEAD WITHOUT CONTRAST  TECHNIQUE: Multiplanar, multiecho pulse sequences of the brain and surrounding structures were obtained without intravenous contrast. Angiographic images of the head were obtained using MRA technique without contrast.  COMPARISON:  Head CT 06/27/2014.  MRI 03/09/2012.  FINDINGS: MRI HEAD FINDINGS  Diffusion imaging does not show any acute or subacute infarction. The brainstem and cerebellum are normal. The cerebral hemispheres show mild chronic small-vessel change within the deep and subcortical white matter, less than often seen in healthy individuals of this age. No cortical or large vessel territory insult. No mass lesion, hemorrhage, hydrocephalus or extra-axial collection. No pituitary mass. No inflammatory sinus disease. No  skull or skullbase lesion.  MRA HEAD FINDINGS  Both internal carotid arteries are widely patent into the brain. The anterior and middle cerebral vessels are patent without proximal stenosis, aneurysm or vascular malformation.  Both vertebral arteries are widely patent to the basilar. No basilar stenosis. Posterior circulation branch vessels are normal.  IMPRESSION: No acute finding.  Mild chronic small-vessel change of the cerebral hemispheric white matter.  Normal intracranial MR angiography of the large and medium size vessels.   Electronically Signed   By: Nelson Chimes M.D.   On: 06/28/2014 18:56   Mr Brain Wo Contrast  06/28/2014   CLINICAL DATA:  Weakness.  History of dementia.  EXAM: MRI HEAD WITHOUT CONTRAST  MRA HEAD WITHOUT CONTRAST  TECHNIQUE: Multiplanar, multiecho pulse sequences of the brain and surrounding structures were obtained without intravenous contrast. Angiographic images of the head were obtained using MRA technique without contrast.  COMPARISON:  Head CT 06/27/2014.  MRI 03/09/2012.  FINDINGS: MRI HEAD FINDINGS  Diffusion imaging does not show any acute or subacute infarction. The brainstem and cerebellum are normal. The cerebral hemispheres show mild chronic small-vessel change within the deep and subcortical white matter, less than often seen in healthy individuals of this age. No cortical or large vessel territory insult. No mass lesion, hemorrhage, hydrocephalus or extra-axial collection. No pituitary mass. No inflammatory sinus disease. No skull or skullbase lesion.  MRA HEAD FINDINGS  Both internal carotid arteries are widely patent into the brain. The anterior and middle cerebral vessels are patent without proximal stenosis, aneurysm or vascular malformation.  Both vertebral arteries are widely patent to the basilar. No basilar stenosis. Posterior circulation branch vessels are normal.  IMPRESSION: No acute finding.  Mild chronic small-vessel change of the cerebral hemispheric white  matter.  Normal intracranial MR angiography of the large and medium size vessels.   Electronically Signed   By: Nelson Chimes M.D.   On: 06/28/2014 18:56   US Carotid Bilateral  06/28/2014   CLINICAL DATA:  Left-sided numbness  EXAM: BILATERAL CAROTID DUPLEX ULTRASOUND  TECHNIQUE: Pearline Cables scale imaging, color Doppler and duplex ultrasound were performed of bilateral carotid and vertebral arteries in the neck.  COMPARISON:  None.  FINDINGS: Criteria: Quantification of carotid stenosis is based on velocity parameters that correlate the residual internal carotid diameter with NASCET-based stenosis levels, using the diameter of the distal internal carotid lumen as the denominator for stenosis measurement.  The following velocity measurements were obtained:  RIGHT  ICA:  61/17 cm/sec  CCA:  77/41 cm/sec  SYSTOLIC ICA/CCA RATIO:  8.33  DIASTOLIC ICA/CCA RATIO:  1.5  ECA:  218 cm/sec  LEFT  ICA:  102/29 cm/sec  CCA:  82/5 cm/sec  SYSTOLIC ICA/CCA RATIO:  0.53  DIASTOLIC ICA/CCA RATIO:  3.2  ECA:  70 cm/sec  RIGHT CAROTID ARTERY: Calcific plaque is noted in the common and internal carotid artery on the right. The waveforms, velocities and flow velocity ratios however demonstrate no evidence of focal hemodynamically significant stenosis. Elevation of the external carotid artery velocity is noted suggestive of a degree of narrowing.  RIGHT VERTEBRAL ARTERY:  Antegrade  LEFT CAROTID ARTERY: Diffuse calcific plaque is noted. The waveforms, velocities and flow velocity ratios however demonstrate no evidence of focal hemodynamically significant stenosis.  LEFT VERTEBRAL ARTERY:  Antegrade in nature.  IMPRESSION: No internal carotid artery stenosis is noted.  Bilateral calcific plaque is seen.  Elevated velocities suggestive of a stenosis in the right external carotid artery.   Electronically Signed   By: Inez Catalina M.D.   On: 06/28/2014 11:35   Nm Myocar Multi W/spect W/wall Motion / Ef  06/29/2014   CLINICAL DATA:  Chest pain.   EXAM: MYOCARDIAL IMAGING WITH SPECT (REST AND PHARMACOLOGIC-STRESS)  GATED LEFT VENTRICULAR WALL MOTION STUDY  LEFT VENTRICULAR EJECTION FRACTION  TECHNIQUE: Standard myocardial SPECT imaging was performed after resting intravenous injection of 10 mCi Tc-58m sestamibi. Subsequently, intravenous infusion of Lexiscan was performed under the supervision of the Cardiology staff. At peak effect of the drug, 30 mCi Tc-50m sestamibi was injected intravenously and standard myocardial SPECT imaging was performed. Quantitative gated imaging was also performed to evaluate left ventricular wall motion, and estimate left ventricular ejection fraction.  COMPARISON:  None.  FINDINGS: The SPECT stress and rest images demonstrate a fixed defect in the inferolateral wall consistent with scar/infarction. No reversible defects to suggest ischemia. The ejection fraction was calculated at 48%.  IMPRESSION: Inferolateral scar/infarction. No findings to suggest myocardial ischemia.  Ejection fraction calculated at 48%.   Electronically Signed   By: Kalman Jewels M.D.   On: 06/29/2014 15:47   Dg Chest Port 1 View  06/27/2014   CLINICAL DATA:  Chest pain.  EXAM: PORTABLE CHEST - 1 VIEW  COMPARISON:  03/11/2014  FINDINGS: Changes from CABG surgery are stable. Cardiac silhouette is borderline enlarged. Normal mediastinal and hilar contours.  Lungs are clear.  No pleural effusion or pneumothorax.  Bony thorax is demineralized but grossly intact.  IMPRESSION: No acute cardiopulmonary disease.   Electronically Signed   By: Lajean Manes M.D.   On: 06/27/2014 16:58

## 2014-07-11 NOTE — Progress Notes (Signed)
HPI: Dustin [name] is a 80 year old patient of Dr. Domenic Polite her on for ongoing assessment and management of CAD, hypertension with early dementia by history. The patient was last seen by Dr. Domenic Polite in June of 2014 to be est. with him. Most recent cardiac catheterization was in August of 2000 following demonstrating patent LIMA to LAD, patent SVG to diagonal, patent SVG to OM1 and OM 2, and disease SVG to PDA, treated with drug-eluting stent x2 as well as more distal disease that was managed medically. LVEF at that time was 50-55%.  Unfortunately, the patient was admitted to the hospital in July 2015 with complaints of chest pain, left-sided weakness likely TIA. The patient also had a lot of anxiety and is being treated for that as well. During hospitalization is ruled out for ACS, echocardiogram revealed EF of 60% with no regional wall motion abnormalities the patient did undergo a stress Myoview to rule out progression of CAD, revealing no her personal ischemia with an EF of 48%. He was continued on dual antiplatelet therapy. Due to bradycardia, coronary discontinued and amlodipine was decreased to 5 mg daily. He continued to have some left-sided weakness with paresthesia which was felt likely to related to TIA.    He comes today with rambling thoughts, multiple questions, wanting to know if he has "grown new heart vessels", but denies chest pain or dizziness. He states he is often forgetful taking his medicines, as he has Alzheimer's. The patient states he also has a neuropathy in his feet and sometimes cannot feel them but continues to drive. He states he takes oxycodone to help with neurologic pain. His wife at home is an invalid, with several chronic diseases. He states is becoming difficult to take care of himself and her. He has a brother who checks on him every day.   No Known Allergies  Current Outpatient Prescriptions  Medication Sig Dispense Refill  . ALPRAZolam (XANAX) 1 MG tablet Take 1  mg by mouth daily as needed. For anxiety       . amLODipine (NORVASC) 10 MG tablet Take 0.5 tablets (5 mg total) by mouth daily.  30 tablet  6  . aspirin EC 81 MG tablet Take 1 tablet (81 mg total) by mouth daily.  30 tablet  3  . clopidogrel (PLAVIX) 75 MG tablet Take 75 mg by mouth daily.        [name] donepezil (ARICEPT) 10 MG tablet Take 1 tablet by mouth At bedtime.      [name] escitalopram (LEXAPRO) 10 MG tablet Take 10 mg by mouth daily.      . fentaNYL (DURAGESIC - DOSED MCG/HR) 100 MCG/HR Place 1 patch onto the skin every 3 (three) days.       [name] gabapentin (NEURONTIN) 100 MG capsule Take 1 capsule (100 mg total) by mouth 3 (three) times daily.  90 capsule  0  . hydrochlorothiazide (,MICROZIDE/HYDRODIURIL,) 12.5 MG capsule Take 12.5 mg by mouth daily.       [name] levothyroxine (SYNTHROID, LEVOTHROID) 25 MCG tablet Take 25 mcg by mouth daily.       [name] NAMENDA 5 MG tablet Take 5 mg by mouth daily.       . nitroGLYCERIN (NITROSTAT) 0.4 MG SL tablet Place 1 tablet (0.4 mg total) under the tongue every 5 (five) minutes as needed. For chest pains  25 tablet  11  . oxyCODONE (ROXICODONE) 15 MG immediate release tablet Take 15 mg by mouth every 6 (six) hours as needed  for pain.       . pravastatin (PRAVACHOL) 40 MG tablet TAKE (1) TABLET BY MOUTH AT BEDTIME.  30 tablet  6  . lansoprazole (PREVACID) 30 MG capsule Take 30 mg by mouth daily.       . traZODone (DESYREL) 50 MG tablet        No current facility-administered medications for this visit.    Past Medical History  Diagnosis Date  . Arthritis   . Coronary atherosclerosis of native coronary artery     a. Multivessel s/p CABG 1996. b. s/p DES x 2 SVG to PDA 8/12 with distal disease managed medically.  . Mixed hyperlipidemia   . Essential hypertension, benign   . MI (myocardial infarction)   . Hematuria   . OSA (obstructive sleep apnea)   . Anxiety   . OA (osteoarthritis)   . DDD (degenerative disc disease)     Chronic back pain  . Alzheimer  disease   . Enlarged prostate   . Hypothyroidism   . Atrophic gastritis     a. By EGD 02/2013.    Past Surgical History  Procedure Laterality Date  . Coronary artery bypass graft  1996    LIMA to LAD, SVG to D2, SVG to PDA, SVG to OM1 and OM2  . Hernia repair    . Coronary angioplasty with stent placement    . Colonoscopy  08/03/2004    Jenkins-numerous large diverticula in the descending, transverse, descending, and sigmoid colon. Otherwise normal exam.  . Colonoscopy  07/12/2012    RMR: External hemorrhoidal tag; multiple rectal and colonic polyps removed and/or treated as described above. Pancolonic diverticulosis. Bx-tubular adenomas, rectal hyperplastic polyp. next colonoscopy in 06/2015.  [name] Esophagogastroduodenoscopy N/A 03/16/2013    Procedure: ESOPHAGOGASTRODUODENOSCOPY (EGD);  Surgeon: Daneil Dolin, MD;  Location: [facility];  Service: Endoscopy;  Laterality: N/A;  12:00-moved to [place] notified pt  . Esophagogastroduodenoscopy N/A 03/06/2013    Procedure: ESOPHAGOGASTRODUODENOSCOPY (EGD);  Surgeon: Daneil Dolin, MD;  Location: [facility];  Service: Endoscopy;  Laterality: N/A;    ROS:  Review of systems complete and found to be negative unless listed above  PHYSICAL EXAM BP 110/60  Pulse 54  Ht 5\' 9"  (1.753 m)  Wt 181 lb (82.101 kg)  BMI 26.72 kg/m2 General: Well developed, well nourished, in no acute distress Head: Eyes PERRLA, No xanthomas.   Normal cephalic and atramatic  Lungs: Clear bilaterally to auscultation and percussion. Heart: HRRR S1 S2, with 1/6 systolic murmur,  Pulses are 2+ & equal.            No carotid bruit. No JVD.  No abdominal bruits. No femoral bruits. Abdomen: Bowel sounds are positive, abdomen soft and non-tender without masses or                  Hernia's noted. Msk:  Back normal, slow but steady gait. Diminished strength and tone for age. Extremities: No clubbing, cyanosis or edema.  DP +1 Neuro: Alert and oriented X 3. Psych:  Flat affect, some inappropriate responses, with rambling thoughts, lucid responses at other times.    ASSESSMENT AND PLAN

## 2014-08-28 ENCOUNTER — Encounter (HOSPITAL_COMMUNITY): Payer: Self-pay | Admitting: Emergency Medicine

## 2014-08-28 ENCOUNTER — Observation Stay (HOSPITAL_COMMUNITY)
Admission: EM | Admit: 2014-08-28 | Discharge: 2014-08-29 | Disposition: A | Discharge status: Home / Self Care | Payer: Medicare Other | Attending: Family Medicine | Admitting: Family Medicine

## 2014-08-28 ENCOUNTER — Emergency Department (HOSPITAL_COMMUNITY): Payer: Medicare Other

## 2014-08-28 DIAGNOSIS — M1990 Unspecified osteoarthritis, unspecified site: Secondary | ICD-10-CM | POA: Insufficient documentation

## 2014-08-28 DIAGNOSIS — G309 Alzheimer's disease, unspecified: Secondary | ICD-10-CM | POA: Insufficient documentation

## 2014-08-28 DIAGNOSIS — R630 Anorexia: Secondary | ICD-10-CM

## 2014-08-28 DIAGNOSIS — Z7902 Long term (current) use of antithrombotics/antiplatelets: Secondary | ICD-10-CM | POA: Insufficient documentation

## 2014-08-28 DIAGNOSIS — E785 Hyperlipidemia, unspecified: Secondary | ICD-10-CM

## 2014-08-28 DIAGNOSIS — M542 Cervicalgia: Principal | ICD-10-CM

## 2014-08-28 DIAGNOSIS — M129 Arthropathy, unspecified: Secondary | ICD-10-CM | POA: Insufficient documentation

## 2014-08-28 DIAGNOSIS — I10 Essential (primary) hypertension: Secondary | ICD-10-CM

## 2014-08-28 DIAGNOSIS — G8929 Other chronic pain: Secondary | ICD-10-CM | POA: Insufficient documentation

## 2014-08-28 DIAGNOSIS — J40 Bronchitis, not specified as acute or chronic: Secondary | ICD-10-CM

## 2014-08-28 DIAGNOSIS — R209 Unspecified disturbances of skin sensation: Secondary | ICD-10-CM | POA: Insufficient documentation

## 2014-08-28 DIAGNOSIS — G4733 Obstructive sleep apnea (adult) (pediatric): Secondary | ICD-10-CM

## 2014-08-28 DIAGNOSIS — R531 Weakness: Secondary | ICD-10-CM

## 2014-08-28 DIAGNOSIS — Z7982 Long term (current) use of aspirin: Secondary | ICD-10-CM | POA: Insufficient documentation

## 2014-08-28 DIAGNOSIS — IMO0002 Reserved for concepts with insufficient information to code with codable children: Secondary | ICD-10-CM | POA: Insufficient documentation

## 2014-08-28 DIAGNOSIS — K2940 Chronic atrophic gastritis without bleeding: Secondary | ICD-10-CM | POA: Insufficient documentation

## 2014-08-28 DIAGNOSIS — I252 Old myocardial infarction: Secondary | ICD-10-CM | POA: Insufficient documentation

## 2014-08-28 DIAGNOSIS — N400 Enlarged prostate without lower urinary tract symptoms: Secondary | ICD-10-CM | POA: Insufficient documentation

## 2014-08-28 DIAGNOSIS — F0280 Dementia in other diseases classified elsewhere without behavioral disturbance: Secondary | ICD-10-CM

## 2014-08-28 DIAGNOSIS — F411 Generalized anxiety disorder: Secondary | ICD-10-CM | POA: Insufficient documentation

## 2014-08-28 DIAGNOSIS — K5909 Other constipation: Secondary | ICD-10-CM

## 2014-08-28 DIAGNOSIS — Z79899 Other long term (current) drug therapy: Secondary | ICD-10-CM | POA: Insufficient documentation

## 2014-08-28 DIAGNOSIS — D126 Benign neoplasm of colon, unspecified: Secondary | ICD-10-CM

## 2014-08-28 DIAGNOSIS — E039 Hypothyroidism, unspecified: Secondary | ICD-10-CM | POA: Insufficient documentation

## 2014-08-28 DIAGNOSIS — R51 Headache: Secondary | ICD-10-CM | POA: Insufficient documentation

## 2014-08-28 DIAGNOSIS — R519 Headache, unspecified: Secondary | ICD-10-CM

## 2014-08-28 DIAGNOSIS — Z951 Presence of aortocoronary bypass graft: Secondary | ICD-10-CM | POA: Insufficient documentation

## 2014-08-28 DIAGNOSIS — R634 Abnormal weight loss: Secondary | ICD-10-CM

## 2014-08-28 DIAGNOSIS — Z87891 Personal history of nicotine dependence: Secondary | ICD-10-CM | POA: Insufficient documentation

## 2014-08-28 DIAGNOSIS — I2510 Atherosclerotic heart disease of native coronary artery without angina pectoris: Secondary | ICD-10-CM | POA: Diagnosis present

## 2014-08-28 LAB — CBC WITH DIFFERENTIAL/PLATELET
BASOS ABS: 0 10*3/uL (ref 0.0–0.1)
Basophils Relative: 0 % (ref 0–1)
EOS PCT: 1 % (ref 0–5)
Eosinophils Absolute: 0.1 10*3/uL (ref 0.0–0.7)
HCT: 40.7 % (ref 39.0–52.0)
Hemoglobin: 13.7 g/dL (ref 13.0–17.0)
Lymphocytes Relative: 13 % (ref 12–46)
Lymphs Abs: 1.3 10*3/uL (ref 0.7–4.0)
MCH: 32.7 pg (ref 26.0–34.0)
MCHC: 33.7 g/dL (ref 30.0–36.0)
MCV: 97.1 fL (ref 78.0–100.0)
MONO ABS: 0.7 10*3/uL (ref 0.1–1.0)
Monocytes Relative: 7 % (ref 3–12)
Neutro Abs: 7.8 10*3/uL — ABNORMAL HIGH (ref 1.7–7.7)
Neutrophils Relative %: 79 % — ABNORMAL HIGH (ref 43–77)
Platelets: 208 10*3/uL (ref 150–400)
RBC: 4.19 MIL/uL — ABNORMAL LOW (ref 4.22–5.81)
RDW: 12.3 % (ref 11.5–15.5)
WBC: 9.9 10*3/uL (ref 4.0–10.5)

## 2014-08-28 LAB — COMPREHENSIVE METABOLIC PANEL
ALT: 14 U/L (ref 0–53)
AST: 21 U/L (ref 0–37)
Albumin: 4.1 g/dL (ref 3.5–5.2)
Alkaline Phosphatase: 62 U/L (ref 39–117)
Anion gap: 10 (ref 5–15)
BUN: 9 mg/dL (ref 6–23)
CALCIUM: 9.4 mg/dL (ref 8.4–10.5)
CO2: 33 mEq/L — ABNORMAL HIGH (ref 19–32)
CREATININE: 0.7 mg/dL (ref 0.50–1.35)
Chloride: 96 mEq/L (ref 96–112)
GFR, EST NON AFRICAN AMERICAN: 89 mL/min — AB (ref >90)
GLUCOSE: 114 mg/dL — AB (ref 70–99)
Potassium: 4.3 mEq/L (ref 3.7–5.3)
Sodium: 139 mEq/L (ref 137–147)
Total Bilirubin: 1.1 mg/dL (ref 0.3–1.2)
Total Protein: 7.7 g/dL (ref 6.0–8.3)

## 2014-08-28 LAB — PROTIME-INR
INR: 1.04 (ref 0.00–1.49)
PROTHROMBIN TIME: 13.6 s (ref 11.6–15.2)

## 2014-08-28 LAB — PROTEIN AND GLUCOSE, CSF
Glucose, CSF: 72 mg/dL (ref 43–76)
Total  Protein, CSF: 44 mg/dL (ref 15–45)

## 2014-08-28 MED ORDER — HYDROMORPHONE HCL PF 1 MG/ML IJ SOLN
1.0000 mg | Freq: Once | INTRAMUSCULAR | Status: AC
  Administered 2014-08-28: 1 mg via INTRAMUSCULAR
  Filled 2014-08-28: qty 1

## 2014-08-28 MED ORDER — LIDOCAINE HCL (PF) 2 % IJ SOLN
INTRAMUSCULAR | Status: AC
  Administered 2014-08-28: 23:00:00
  Filled 2014-08-28: qty 10

## 2014-08-28 MED ORDER — ONDANSETRON 8 MG PO TBDP
8.0000 mg | ORAL_TABLET | Freq: Once | ORAL | Status: AC
  Administered 2014-08-28: 8 mg via ORAL
  Filled 2014-08-28: qty 1

## 2014-08-28 NOTE — ED Provider Notes (Signed)
Medical screening examination/treatment/procedure(s) were conducted as a shared visit with non-physician practitioner(s) and myself.  I personally evaluated the patient during the encounter.   EKG Interpretation None       Dementia patient with 2 day history of head and neck pain without trauma. No fever. No focal weakness, numbness or tingling.  Patient is oriented x2. He does not want to move his neck at all. Equal grip strength bilaterally. MAXIMUM TEMPERATURE 99.8.  Patient is not on anticoagulants. He does take Plavix and aspirin. CT head and C-spine negative. Torticollis is possible though with reduced range of motion, meningitis needs to be ruled out. Lumbar puncture discussed with wife and consent obtained.  Lumbar Puncture Procedure Note  Pre-operative Diagnosis: headache  Post-operative Diagnosis: headache  Indications: Diagnostic  Procedure Details   Consent: Informed consent was obtained. Risks of the procedure were discussed including: infection, bleeding, pain and headache.  The patient was positioned under sterile conditions. Betadine solution and sterile drapes were utilized. A spinal needle was inserted at the L3 - L4 interspace.  Spinal fluid was obtained and sent to the laboratory.  Findings 106mL of clear spinal fluid was obtained.   Complications:  None; patient tolerated the procedure well.        Condition: stable  Plan Bed rest for 1 hours. Tylenol 650 mg for pain. Call office if you develop a severe headache, nausea, vomiting, or fever greater than 100.5 F.     [name]  Ezequiel Essex, MD 08/29/14 [phone]

## 2014-08-28 NOTE — ED Notes (Signed)
Pt states he slept in a recliner the other night & has neck pain since. Pt denies any injury. Pt has used fentanyl patch & po pain meds w/ no relief.

## 2014-08-28 NOTE — ED Provider Notes (Signed)
CSN: [id number]     Arrival date & time 08/28/14  1744 History   First MD Initiated Contact with Patient 08/28/14 1904     Chief Complaint  Patient presents with  . Neck Pain     (Consider location/radiation/quality/duration/timing/severity/associated sxs/prior Treatment) The history is provided by the patient and the spouse.   Dustin [name] is a 80 y.o. male  With history significant for Alzheimers, MI, chronic back pain with DDD presenting with a 2 day history of neck pain.  He reports he was sleeping in a recliner chair 2 night ago, and when he woke he had pain and stiffness in his neck. His symptoms also now include generalized severe headache.  He and his wife endorse a history of intermittent problems with neck pain which is more severe than normal with this presentation.  He denies injury or fall.  He has severe pain with any attempt at flexion or rotation. He denies radiation into his upper extremities and has not had fevers or chills.  Wife who gives a fair amount of his history states his last dose of oxycodone 15mg  was taken around 2 pm and did not relieve his pain.  He also has a duragesic patch for chronic pain use.  He has had no recent illnesses, no recent antibiotic use.  He denies visual changes or photophobia, denies nausea, vomiting, chest pain or shortness of breath.     Past Medical History  Diagnosis Date  . Arthritis   . Coronary atherosclerosis of native coronary artery     a. Multivessel s/p CABG 1996. b. s/p DES x 2 SVG to PDA 8/12 with distal disease managed medically.  . Mixed hyperlipidemia   . Essential hypertension, benign   . MI (myocardial infarction)   . Hematuria   . OSA (obstructive sleep apnea)   . Anxiety   . OA (osteoarthritis)   . DDD (degenerative disc disease)     Chronic back pain  . Alzheimer disease   . Enlarged prostate   . Hypothyroidism   . Atrophic gastritis     a. By EGD 02/2013.   Past Surgical History  Procedure Laterality Date   . Coronary artery bypass graft  1996    LIMA to LAD, SVG to D2, SVG to PDA, SVG to OM1 and OM2  . Hernia repair    . Coronary angioplasty with stent placement    . Colonoscopy  08/03/2004    Jenkins-numerous large diverticula in the descending, transverse, descending, and sigmoid colon. Otherwise normal exam.  . Colonoscopy  07/12/2012    RMR: External hemorrhoidal tag; multiple rectal and colonic polyps removed and/or treated as described above. Pancolonic diverticulosis. Bx-tubular adenomas, rectal hyperplastic polyp. next colonoscopy in 06/2015.  [name] Esophagogastroduodenoscopy N/A 03/16/2013    Procedure: ESOPHAGOGASTRODUODENOSCOPY (EGD);  Surgeon: Daneil Dolin, MD;  Location: [facility];  Service: Endoscopy;  Laterality: N/A;  12:00-moved to [place] notified pt  . Esophagogastroduodenoscopy N/A 03/06/2013    Procedure: ESOPHAGOGASTRODUODENOSCOPY (EGD);  Surgeon: Daneil Dolin, MD;  Location: [facility];  Service: Endoscopy;  Laterality: N/A;   Family History  Problem Relation Age of Onset  . Heart disease    . Colon cancer Neg Hx    History  Substance Use Topics  . Smoking status: Former Smoker -- 2.00 packs/day for 40 years    Types: Cigarettes    Quit date: 12/27/1994  . Smokeless tobacco: Never Used  . Alcohol Use: No    Review  of Systems  Constitutional: Negative for fever and chills.  HENT: Negative for congestion and sore throat.   Eyes: Negative.  Negative for photophobia and visual disturbance.  Respiratory: Negative for chest tightness and shortness of breath.   Cardiovascular: Negative for chest pain.  Gastrointestinal: Negative for nausea and abdominal pain.  Genitourinary: Negative.   Musculoskeletal: Positive for neck pain. Negative for arthralgias and joint swelling.  Skin: Negative.  Negative for rash and wound.  Neurological: Positive for numbness and headaches. Negative for dizziness, facial asymmetry, speech difficulty, weakness and  light-headedness.       No new speech deficit, wife states he mumbles which is baseline.  Psychiatric/Behavioral: Negative.       Allergies  Review of patient's allergies indicates no known allergies.  Home Medications   Prior to Admission medications   Medication Sig Start Date End Date Taking? Authorizing Provider  ALPRAZolam Duanne Moron) 1 MG tablet Take 1 mg by mouth daily as needed. For anxiety    Yes Historical Provider, MD  amLODipine (NORVASC) 10 MG tablet Take 10 mg by mouth daily.   Yes Historical Provider, MD  aspirin EC 81 MG tablet Take 1 tablet (81 mg total) by mouth daily. 06/29/14  Yes Ripudeep Krystal Eaton, MD  carvedilol (COREG) 3.125 MG tablet Take 1 tablet by mouth 2 (two) times daily. 08/06/14  Yes Historical Provider, MD  clopidogrel (PLAVIX) 75 MG tablet Take 75 mg by mouth daily.    Yes Historical Provider, MD  donepezil (ARICEPT) 10 MG tablet Take 1 tablet by mouth At bedtime. 07/01/12  Yes Historical Provider, MD  escitalopram (LEXAPRO) 10 MG tablet Take 10 mg by mouth daily.   Yes Historical Provider, MD  fentaNYL (DURAGESIC - DOSED MCG/HR) 100 MCG/HR Place 1 patch onto the skin every 3 (three) days.  06/19/12  Yes Historical Provider, MD  gabapentin (NEURONTIN) 100 MG capsule Take 100 mg by mouth daily.   Yes Historical Provider, MD  hydrochlorothiazide (,MICROZIDE/HYDRODIURIL,) 12.5 MG capsule Take 12.5 mg by mouth daily.  07/12/11  Yes Historical Provider, MD  levothyroxine (SYNTHROID, LEVOTHROID) 25 MCG tablet Take 25 mcg by mouth daily.  06/19/12  Yes Historical Provider, MD  LYRICA 75 MG capsule Take 1 capsule by mouth 3 (three) times daily. 07/25/14  Yes Historical Provider, MD  NAMENDA 5 MG tablet Take 5 mg by mouth daily.  07/23/11  Yes Historical Provider, MD  nitroGLYCERIN (NITROSTAT) 0.4 MG SL tablet Place 0.4 mg under the tongue every 5 (five) minutes as needed. For chest pains 08/24/13  Yes Satira Sark, MD  oxyCODONE (ROXICODONE) 15 MG immediate release tablet Take  15 mg by mouth every 6 (six) hours as needed for pain.  05/31/13  Yes Historical Provider, MD  pravastatin (PRAVACHOL) 40 MG tablet TAKE (1) TABLET BY MOUTH AT BEDTIME. 12/26/13  Yes Satira Sark, MD  traZODone (DESYREL) 50 MG tablet Take 50 mg by mouth at bedtime.  06/06/14  Yes Historical Provider, MD   BP 115/54  Pulse 52  Temp(Src) 99.8 F (37.7 C) (Rectal)  Resp 18  Ht 5\' 9"  (1.753 m)  Wt 185 lb (83.915 kg)  BMI 27.31 kg/m2  SpO2 99% Physical Exam  Nursing note and vitals reviewed. Constitutional: He appears well-developed and well-nourished. He appears distressed.  HENT:  Head: Normocephalic and atraumatic.  Right Ear: Tympanic membrane normal.  Left Ear: Tympanic membrane normal.  Mouth/Throat: Uvula is midline and oropharynx is clear and moist.  Eyes: Conjunctivae are normal. Pupils are  equal, round, and reactive to light. Right eye exhibits abnormal extraocular motion. Left eye exhibits abnormal extraocular motion.  Pt does not follow directions for testing eom's.    Neck: Trachea normal and normal range of motion. Spinous process tenderness and muscular tenderness present. Rigidity present.  Cardiovascular: Normal rate and intact distal pulses.   Pedal pulses normal.  Pulmonary/Chest: Effort normal. No respiratory distress. He has no wheezes. He exhibits no tenderness.  Abdominal: Soft. Bowel sounds are normal. He exhibits no distension and no mass. There is no tenderness.  Musculoskeletal: He exhibits no edema.       Cervical back: He exhibits decreased range of motion and tenderness. He exhibits no swelling and no spasm.  Lymphadenopathy:    He has no cervical adenopathy.  Neurological: He is alert. He has normal strength. He displays no atrophy and no tremor. A sensory deficit is present. No cranial nerve deficit. He exhibits normal muscle tone. He displays no seizure activity.  Reflex Scores:      Patellar reflexes are 2+ on the right side and 2+ on the left side.       Achilles reflexes are 2+ on the right side and 2+ on the left side. No strength deficit noted in hip and knee flexor and extensor muscle groups.  Ankle flexion and extension intact. Equal grip strength.  Wrist and elbow flex/ext equal, no strength deficit.  Oriented to person and place.  Decreased sensation fine touch bilateral dorsal feet.  Unable to assess gait.  Skin: Skin is warm and dry.  Psychiatric: His behavior is normal. His speech is slurred.  Speech is difficult with mild slurring, wife states baseline.    ED Course  Procedures (including critical care time) Labs Review Labs Reviewed  CBC WITH DIFFERENTIAL - Abnormal; Notable for the following:    RBC 4.19 (*)    Neutrophils Relative % 79 (*)    Neutro Abs 7.8 (*)    All other components within normal limits  COMPREHENSIVE METABOLIC PANEL - Abnormal; Notable for the following:    CO2 33 (*)    Glucose, Bld 114 (*)    GFR calc non Af Amer 89 (*)    All other components within normal limits  CSF CULTURE  PROTIME-INR  CSF CELL COUNT WITH DIFFERENTIAL  CSF CELL COUNT WITH DIFFERENTIAL  PROTEIN AND GLUCOSE, CSF    Imaging Review Ct Head Wo Contrast  08/28/2014   CLINICAL DATA:  Left-sided neck pain and headaches  EXAM: CT HEAD WITHOUT CONTRAST  CT CERVICAL SPINE WITHOUT CONTRAST  TECHNIQUE: Multidetector CT imaging of the head and cervical spine was performed following the standard protocol without intravenous contrast. Multiplanar CT image reconstructions of the cervical spine were also generated.  COMPARISON:  06/27/2014  FINDINGS: CT HEAD FINDINGS  The bony calvarium is intact. Mild atrophic changes are noted. No findings to suggest acute hemorrhage, acute infarction or space-occupying mass lesion are noted.  CT CERVICAL SPINE FINDINGS  Seven cervical segments are well visualized. Mild osteophytic changes are noted. No acute fracture or acute facet abnormality is noted. The surrounding soft tissues are unremarkable. Mild  facet hypertrophic changes are noted.  IMPRESSION: CT head:  Atrophic changes without acute abnormality.  CT of the cervical spine: Degenerative change without acute abnormality.   Electronically Signed   By: Inez Catalina M.D.   On: 08/28/2014 20:40   Ct Cervical Spine Wo Contrast  08/28/2014   CLINICAL DATA:  Left-sided neck pain and headaches  EXAM:  CT HEAD WITHOUT CONTRAST  CT CERVICAL SPINE WITHOUT CONTRAST  TECHNIQUE: Multidetector CT imaging of the head and cervical spine was performed following the standard protocol without intravenous contrast. Multiplanar CT image reconstructions of the cervical spine were also generated.  COMPARISON:  06/27/2014  FINDINGS: CT HEAD FINDINGS  The bony calvarium is intact. Mild atrophic changes are noted. No findings to suggest acute hemorrhage, acute infarction or space-occupying mass lesion are noted.  CT CERVICAL SPINE FINDINGS  Seven cervical segments are well visualized. Mild osteophytic changes are noted. No acute fracture or acute facet abnormality is noted. The surrounding soft tissues are unremarkable. Mild facet hypertrophic changes are noted.  IMPRESSION: CT head:  Atrophic changes without acute abnormality.  CT of the cervical spine: Degenerative change without acute abnormality.   Electronically Signed   By: Inez Catalina M.D.   On: 08/28/2014 20:40     EKG Interpretation None      MDM   Final diagnoses:  None    Patients labs and/or radiological studies were viewed and considered during the medical decision making and disposition process. Pt was seen by Dr Wyvonnia Dusky, LP performed,  Negative, pending cultures.  Pt still with severe pain in neck.  Unable to sit or stand.  Groaning, lying supine, unwilling to move neck.  Wife at bedside states she cannot take care of him at home in this condition.  He normally does ambulate briefly at home but limited by neuropathy and generally requires assistance.   Unsuccessful attempt to get patient to sit up from  supine position here. RN confirms he required assistance with transferring from wheelchair to bed.  At time of dispo,  Other family now at bedside.  Daughter states her dad has had a changes in his level of alertness the past several days.  She states he was driving a car up to 3 days ago, which wife confirms, but also states she does not believe he should be driving.    Call placed to Dr Marin Comment who will see pt in ed.  Evalee Jefferson, PA-C 08/29/14 [phone]

## 2014-08-28 NOTE — ED Notes (Signed)
Left sided neck pain since waking yesterday morning.  States is unable to move neck.  Denies injury.

## 2014-08-29 ENCOUNTER — Inpatient Hospital Stay
Admission: RE | Admit: 2014-08-29 | Discharge: 2014-08-30 | Disposition: A | Discharge status: Nursing Home (Includes ICF) | Payer: Medicare Other | Source: Ambulatory Visit | Attending: Internal Medicine | Admitting: Internal Medicine

## 2014-08-29 ENCOUNTER — Encounter (HOSPITAL_COMMUNITY): Payer: Self-pay | Admitting: Internal Medicine

## 2014-08-29 ENCOUNTER — Emergency Department (HOSPITAL_COMMUNITY): Payer: Medicare Other

## 2014-08-29 DIAGNOSIS — G309 Alzheimer's disease, unspecified: Secondary | ICD-10-CM

## 2014-08-29 DIAGNOSIS — M542 Cervicalgia: Principal | ICD-10-CM

## 2014-08-29 DIAGNOSIS — F0280 Dementia in other diseases classified elsewhere without behavioral disturbance: Secondary | ICD-10-CM

## 2014-08-29 LAB — CSF CELL COUNT WITH DIFFERENTIAL
RBC Count, CSF: 1 /mm3 — ABNORMAL HIGH
RBC Count, CSF: 2 /mm3 — ABNORMAL HIGH
Tube #: 1
Tube #: 4
WBC, CSF: 0 /mm3 (ref 0–5)
WBC, CSF: 0 /mm3 (ref 0–5)

## 2014-08-29 LAB — TSH: TSH: 1.93 u[IU]/mL (ref 0.350–4.500)

## 2014-08-29 MED ORDER — SIMVASTATIN 10 MG PO TABS: 10.0000 mg | ORAL_TABLET | Freq: Every day | ORAL | Status: DC

## 2014-08-29 MED ORDER — ALPRAZOLAM 1 MG PO TABS: 1.0000 mg | ORAL_TABLET | Freq: Every day | ORAL | Status: DC | PRN

## 2014-08-29 MED ORDER — PREGABALIN 75 MG PO CAPS
75.0000 mg | ORAL_CAPSULE | Freq: Three times a day (TID) | ORAL | Status: DC
  Administered 2014-08-29: 75 mg via ORAL
  Filled 2014-08-29: qty 1

## 2014-08-29 MED ORDER — CLOPIDOGREL BISULFATE 75 MG PO TABS
75.0000 mg | ORAL_TABLET | Freq: Every day | ORAL | Status: DC
  Administered 2014-08-29: 75 mg via ORAL
  Filled 2014-08-29: qty 1

## 2014-08-29 MED ORDER — GABAPENTIN 100 MG PO CAPS
100.0000 mg | ORAL_CAPSULE | Freq: Every day | ORAL | Status: DC
  Administered 2014-08-29: 100 mg via ORAL
  Filled 2014-08-29: qty 1

## 2014-08-29 MED ORDER — ONDANSETRON HCL 4 MG/2ML IJ SOLN: 4.0000 mg | Freq: Four times a day (QID) | INTRAMUSCULAR | Status: DC | PRN

## 2014-08-29 MED ORDER — AMLODIPINE BESYLATE 5 MG PO TABS
10.0000 mg | ORAL_TABLET | Freq: Every day | ORAL | Status: DC
  Administered 2014-08-29: 10 mg via ORAL
  Filled 2014-08-29: qty 2

## 2014-08-29 MED ORDER — ESCITALOPRAM OXALATE 10 MG PO TABS
10.0000 mg | ORAL_TABLET | Freq: Every day | ORAL | Status: DC
  Administered 2014-08-29: 10 mg via ORAL
  Filled 2014-08-29: qty 1

## 2014-08-29 MED ORDER — LEVOTHYROXINE SODIUM 25 MCG PO TABS
25.0000 ug | ORAL_TABLET | Freq: Every day | ORAL | Status: DC
  Administered 2014-08-29: 25 ug via ORAL
  Filled 2014-08-29: qty 1

## 2014-08-29 MED ORDER — CYCLOBENZAPRINE HCL 5 MG PO TABS: 5.0000 mg | ORAL_TABLET | Freq: Two times a day (BID) | ORAL | Status: DC | PRN

## 2014-08-29 MED ORDER — HYDROMORPHONE HCL PF 1 MG/ML IJ SOLN: 1.0000 mg | INTRAMUSCULAR | Status: DC | PRN

## 2014-08-29 MED ORDER — CYCLOBENZAPRINE HCL 10 MG PO TABS
5.0000 mg | ORAL_TABLET | Freq: Two times a day (BID) | ORAL | Status: DC | PRN
  Administered 2014-08-29: 5 mg via ORAL
  Filled 2014-08-29: qty 1

## 2014-08-29 MED ORDER — FENTANYL 100 MCG/HR TD PT72: 100.0000 ug | MEDICATED_PATCH | TRANSDERMAL | Status: DC

## 2014-08-29 MED ORDER — CARVEDILOL 3.125 MG PO TABS
3.1250 mg | ORAL_TABLET | Freq: Two times a day (BID) | ORAL | Status: DC
  Administered 2014-08-29: 3.125 mg via ORAL
  Filled 2014-08-29: qty 1

## 2014-08-29 MED ORDER — ALPRAZOLAM 1 MG PO TABS: 1.0000 mg | ORAL_TABLET | Freq: Three times a day (TID) | ORAL | Status: DC | PRN

## 2014-08-29 MED ORDER — IOHEXOL 350 MG/ML SOLN
80.0000 mL | Freq: Once | INTRAVENOUS | Status: AC | PRN
  Administered 2014-08-29: 80 mL via INTRAVENOUS

## 2014-08-29 MED ORDER — OXYCODONE HCL 15 MG PO TABS: 15.0000 mg | ORAL_TABLET | Freq: Four times a day (QID) | ORAL | Status: DC | PRN

## 2014-08-29 MED ORDER — ONDANSETRON HCL 4 MG PO TABS: 4.0000 mg | ORAL_TABLET | Freq: Four times a day (QID) | ORAL | Status: DC | PRN

## 2014-08-29 MED ORDER — ASPIRIN EC 81 MG PO TBEC
81.0000 mg | DELAYED_RELEASE_TABLET | Freq: Every day | ORAL | Status: DC
  Administered 2014-08-29: 81 mg via ORAL
  Filled 2014-08-29: qty 1

## 2014-08-29 NOTE — Clinical Social Work Psychosocial (Signed)
Clinical Social Work Department BRIEF PSYCHOSOCIAL ASSESSMENT 08/29/2014  Patient:  Dustin [name], Dustin [name]     Account Number:  [id number]     Admit date:  08/28/2014  Clinical Social Worker:  Dustin [name]  Date/Time:  08/29/2014 01:39 PM  Referred by:  Physician  Date Referred:  08/29/2014 Referred for  SNF Placement   Other Referral:   Interview type:  Patient Other interview type:   brother- Dustin [name]    PSYCHOSOCIAL DATA Living Status:  WIFE Admitted from facility:   Level of care:   Primary support name:  Dustin [name] Primary support relationship to patient:  SPOUSE Degree of support available:   supportive    CURRENT CONCERNS Current Concerns  Post-Acute Placement   Other Concerns:    SOCIAL WORK ASSESSMENT / PLAN CSW met with pt and pt's brother at bedside. Pt alert and reports he lives with his wife. Pt came to ED yesterday due to neck pain x3 days after sleeping in recliner. Admitted due to pain with plan to also find placement. [name], Dustin [name] is also very involved and helps them out with all outdoor work and going to [employer]. Per Dustin [name], their family has experienced a lot of loss and he and his brother are very close. Pt states that he gets up about 10:30 in the morning and likes to go outside. He enjoys pulling weeds in their flower bed. Pt said he was diagnosed with dementia several years ago. Dustin [name] reports pt is sometimes forgetful, but otherwise has no problems. He will become frustrated when he realizes he has forgotten something. Prior to injury, pt was doing fine at home. He was fairly independent and still driving. Dustin [name] describes a sudden change in mental status as well as his mobility related to pain. PT evaluated pt and recommendation was for home health vs SNF. CSW discussed both options. Pt wants to defer to his wife. Dustin [name] states that pt's wife is having an outpatient knee surgery tomorrow. They are more concerned about pt's need for additional care due to  this. Dustin [name] is willing to help out, but admits he feels it would be better for pt to have rehab first. [name] also agrees with this plan and requests [facility] if possible. CSW initiated referral and presented bed offers. Family choose [facility]. Facility notified and aware of d/c today. Pt to transfer with RN. CSW will fax d/c summary upon completion.   Assessment/plan status:  Psychosocial Support/Ongoing Assessment of Needs Other assessment/ plan:   Information/referral to community resources:   SNF list    PATIENT'S/FAMILY'S RESPONSE TO PLAN OF CARE: Pt wants to defer d/c planning decisions to his wife. [name] accepts bed at [facility] for short term rehab.       Dustin [name], [place]

## 2014-08-29 NOTE — Evaluation (Signed)
Physical Therapy Evaluation Patient Details Name: Dustin [name] MRN: [id number] DOB: 12/19/1934 Today's Date: 08/29/2014   History of Present Illness  Dustin [name] is an 80 y.o. male with hx of dementia, mild to moderate, on Aricept and Namenda, hx of CAD, HTN, OSA, OA, Hypothyroidism, presents to the ER with neck pain acutely for 2 days, without hx of trauma.  He has had no fever, chills, nausea, vomiting, or chest pain.  He denied any neurological symptoms.  Wife stated 2 days ago, he was out pulling grass, and he has been driving.  He does have hx of neuropathy, and has been on Gabapentin and lyrica, along with Fentanyl patch at 100mg /hour/72 hrs.  Evalaution in the ER showed head CT and neck CT without contrast negative without any acute process.  He has no fever and no leukocytosis.  An LP was done and it was negative.  Hospitalist was asked to admit him as his neck pain was not controlled, and his wife can no longer take care of him at this time.  Clinical Impression  Pt is a 80 year old male who presents to PT with dx of neck pain.  Brother present for evaluation, and provided hx as pt has hx of dementia.  Family reports until 4 days ago, pt was able to drive, get out of bed, and ambulation with supervision.  4 days ago, pt was sleeping in recliner and awoke with complaints of Lt sided neck pain, and has been unable to complete functional mobility skills without assist since then.  Noted increased tone and tenderness with palpation along insertion to muscle belly of Lt upper trapezius muscle. Attempted massage techniques and gentle cervical ROM techniques to decrease tone, though pt unable to tolerate and keep pushing PT hands away during attempts to decrease pain.  During evaluation, pt reported increased complaints of pain and notable facial grimacing during transfer to EOB, and stopped transfer multiple times secondary to pain.  Once at EOB, noted good sitting balance, though pt did keep head  tilted to the Lt.  Pt required min guard and use of RW for transfers and ambulation skills.  Recommend continued PT while in the hospital to address strengthening and functional mobility skills.  Unsure of discharge recommendation, though pt may benefit from SNF placement to continue rehab.  Brother does report that the pt wife will be undergoing TKA soon and will be unable to assist with with patient at home currently or at discharge. If pt is to return home, pt will require use of RW for safe gait.  Educated pt/family on techniques to decrease pain (massage, gentle ROM, use of OTC creams).      Follow Up Recommendations Other (comment) (SNF placement vs. HHPT)    Equipment Recommendations  Rolling walker with 5" wheels       Precautions / Restrictions Precautions Precautions: Fall Restrictions Weight Bearing Restrictions: No      Mobility  Bed Mobility Overal bed mobility: Needs Assistance Bed Mobility: Supine to Sit;Sit to Supine     Supine to sit: Mod assist (for trunk) Sit to supine: Min assist (for LE)   General bed mobility comments: Assist required for bed mobility skills secondary to complaints of Lt sided neck pain.  Pt stopped movement in the middle of transfer secondary to pain, requiring assistance to complete movement.   Transfers Overall transfer level: Needs assistance Equipment used: Rolling walker (2 wheeled) Transfers: Sit to/from Stand Sit to Stand: Min guard  Ambulation/Gait Ambulation/Gait assistance: Min guard Ambulation Distance (Feet): 10 Feet Assistive device: Rolling walker (2 wheeled) Gait Pattern/deviations: Shuffle   Gait velocity interpretation: Below normal speed for age/gender General Gait Details: Family reports onset of shuffled gait pattern over past 4 days.     Balance Overall balance assessment: Needs assistance Sitting-balance support: Feet supported;Single extremity supported Sitting balance-Leahy Scale: Good      Standing balance support: During functional activity;Bilateral upper extremity supported Standing balance-Leahy Scale: Good                               Pertinent Vitals/Pain Pain Assessment: Faces Faces Pain Scale: Hurts whole lot (During transfer in/out of bed) Pain Location: Lt side of neck Pain Intervention(s): Patient requesting pain meds-RN notified;Limited activity within patient's tolerance    Home Living Family/patient expects to be discharged to:: Unsure Living Arrangements: Spouse/significant other Available Help at Discharge: Family;Available 24 hours/day (Lives with wife, brother lives nearby) Type of Home: House Home Access: Level entry     Home Layout: One level Home Equipment: Cane - single point Additional Comments: Tub shower    Prior Function Level of Independence: Independent         Comments: Per brother, pt was (I) with functional mobility skills until 4 days ago.  4 days ago he hurt his neck after sleeping in a recliner, and has been unable to transfers/ambulate without assist secodnary to pain.         Extremity/Trunk Assessment               Lower Extremity Assessment: Generalized weakness      Cervical / Trunk Assessment: Other exceptions  Communication      Cognition Arousal/Alertness: Awake/alert Behavior During Therapy: WFL for tasks assessed/performed Overall Cognitive Status: History of cognitive impairments - at baseline                        Assessment/Plan    PT Assessment Patient needs continued PT services  PT Diagnosis Difficulty walking   PT Problem List Decreased range of motion;Decreased strength;Decreased activity tolerance;Decreased mobility  PT Treatment Interventions Gait training;Balance training;DME instruction;Functional mobility training;Therapeutic activities;Therapeutic exercise;Patient/family education   PT Goals (Current goals can be found in the Care Plan section) Acute Rehab PT  Goals PT Goal Formulation: Patient unable to participate in goal setting    Frequency Min 3X/week    End of Session Equipment Utilized During Treatment: Gait belt;Oxygen Activity Tolerance: Patient limited by pain Patient left: in bed;with call bell/phone within reach;with bed alarm set;with family/visitor present Nurse Communication: Mobility status;Patient requests pain meds         Time: 6761-9509 PT Time Calculation (min): 56 min   Charges:   PT Evaluation $Initial PT Evaluation Tier I: 1 Procedure PT Treatments $Self Care/Home Management: 8-22 (Tecniques for pain management (massage, gentle stretching/ROM, OTC creams), SNF vs. HHPT )   Nicoli Nardozzi 08/29/2014, 12:11 PM

## 2014-08-29 NOTE — H&P (Signed)
[name] History and Physical  Dustin [name] YKD:[id number] DOB: 1934-11-02    PCP:   Glo Herring., MD   Chief Complaint: acute neck pain, R greater than L.  HPI: Dustin [name] is an 80 y.o. male with hx of dementia, mild to moderate, on Aricept and Namenda, hx of CAD, HTN, OSA, OA, Hypothyroidism, presents to the ER with neck pain acutely for 2 days, without hx of trauma.  He has had no fever, chills, nausea, vomiting, or chest pain.  He denied any neurological symptoms.  Wife stated 2 days ago, he was out pulling grass, and he has been driving.  He does have hx of neuropathy, and has been on Gabapentin and lyrica, along with Fentanyl patch at 100mg /hour/72 hrs.  Evalaution in the ER showed head CT and neck CT without contrast negative without any acute process.  He has no fever and no leukocytosis.  An LP was done and it was negative.  Hospitalist was asked to admit him as his neck pain was not controlled, and his wife can no longer take care of him at this time.  Rewiew of Systems:  Constitutional: Negative for malaise, fever and chills. No significant weight loss or weight gain Eyes: Negative for eye pain, redness and discharge, diplopia, visual changes, or flashes of light. ENMT: Negative for ear pain, hoarseness, nasal congestion, sinus pressure and sore throat. No headaches; tinnitus, drooling, or problem swallowing. Cardiovascular: Negative for chest pain, palpitations, diaphoresis, dyspnea and peripheral edema. ; No orthopnea, PND Respiratory: Negative for cough, hemoptysis, wheezing and stridor. No pleuritic chestpain. Gastrointestinal: Negative for nausea, vomiting, diarrhea, constipation, abdominal pain, melena, blood in stool, hematemesis, jaundice and rectal bleeding.    Genitourinary: Negative for frequency, dysuria, incontinence,flank pain and hematuria; Musculoskeletal: Negative for back pain and neck pain. Negative for swelling and trauma.;  Skin: . Negative  for pruritus, rash, abrasions, bruising and skin lesion.; ulcerations Neuro: Negative for headache, lightheadedness and neck stiffness. Negative for weakness, altered level of consciousness , altered mental status, extremity weakness, burning feet, involuntary movement, seizure and syncope.  Psych: negative for anxiety, depression, insomnia, tearfulness, panic attacks, hallucinations, paranoia, suicidal or homicidal ideation    Past Medical History  Diagnosis Date  . Arthritis   . Coronary atherosclerosis of native coronary artery     a. Multivessel s/p CABG 1996. b. s/p DES x 2 SVG to PDA 8/12 with distal disease managed medically.  . Mixed hyperlipidemia   . Essential hypertension, benign   . MI (myocardial infarction)   . Hematuria   . OSA (obstructive sleep apnea)   . Anxiety   . OA (osteoarthritis)   . DDD (degenerative disc disease)     Chronic back pain  . Alzheimer disease   . Enlarged prostate   . Hypothyroidism   . Atrophic gastritis     a. By EGD 02/2013.    Past Surgical History  Procedure Laterality Date  . Coronary artery bypass graft  1996    LIMA to LAD, SVG to D2, SVG to PDA, SVG to OM1 and OM2  . Hernia repair    . Coronary angioplasty with stent placement    . Colonoscopy  08/03/2004    Jenkins-numerous large diverticula in the descending, transverse, descending, and sigmoid colon. Otherwise normal exam.  . Colonoscopy  07/12/2012    RMR: External hemorrhoidal tag; multiple rectal and colonic polyps removed and/or treated as described above. Pancolonic diverticulosis. Bx-tubular adenomas, rectal hyperplastic polyp. next colonoscopy in 06/2015.  [name]  Esophagogastroduodenoscopy N/A 03/16/2013    Procedure: ESOPHAGOGASTRODUODENOSCOPY (EGD);  Surgeon: Daneil Dolin, MD;  Location: [facility];  Service: Endoscopy;  Laterality: N/A;  12:00-moved to [place] notified pt  . Esophagogastroduodenoscopy N/A 03/06/2013    Procedure: ESOPHAGOGASTRODUODENOSCOPY (EGD);   Surgeon: Daneil Dolin, MD;  Location: [facility];  Service: Endoscopy;  Laterality: N/A;    Medications:  HOME MEDS: Prior to Admission medications   Medication Sig Start Date End Date Taking? Authorizing Provider  ALPRAZolam Duanne Moron) 1 MG tablet Take 1 mg by mouth daily as needed. For anxiety    Yes Historical Provider, MD  amLODipine (NORVASC) 10 MG tablet Take 10 mg by mouth daily.   Yes Historical Provider, MD  aspirin EC 81 MG tablet Take 1 tablet (81 mg total) by mouth daily. 06/29/14  Yes Ripudeep Krystal Eaton, MD  carvedilol (COREG) 3.125 MG tablet Take 1 tablet by mouth 2 (two) times daily. 08/06/14  Yes Historical Provider, MD  clopidogrel (PLAVIX) 75 MG tablet Take 75 mg by mouth daily.    Yes Historical Provider, MD  donepezil (ARICEPT) 10 MG tablet Take 1 tablet by mouth At bedtime. 07/01/12  Yes Historical Provider, MD  escitalopram (LEXAPRO) 10 MG tablet Take 10 mg by mouth daily.   Yes Historical Provider, MD  fentaNYL (DURAGESIC - DOSED MCG/HR) 100 MCG/HR Place 1 patch onto the skin every 3 (three) days.  06/19/12  Yes Historical Provider, MD  gabapentin (NEURONTIN) 100 MG capsule Take 100 mg by mouth daily.   Yes Historical Provider, MD  hydrochlorothiazide (,MICROZIDE/HYDRODIURIL,) 12.5 MG capsule Take 12.5 mg by mouth daily.  07/12/11  Yes Historical Provider, MD  levothyroxine (SYNTHROID, LEVOTHROID) 25 MCG tablet Take 25 mcg by mouth daily.  06/19/12  Yes Historical Provider, MD  LYRICA 75 MG capsule Take 1 capsule by mouth 3 (three) times daily. 07/25/14  Yes Historical Provider, MD  NAMENDA 5 MG tablet Take 5 mg by mouth daily.  07/23/11  Yes Historical Provider, MD  nitroGLYCERIN (NITROSTAT) 0.4 MG SL tablet Place 0.4 mg under the tongue every 5 (five) minutes as needed. For chest pains 08/24/13  Yes Satira Sark, MD  oxyCODONE (ROXICODONE) 15 MG immediate release tablet Take 15 mg by mouth every 6 (six) hours as needed for pain.  05/31/13  Yes Historical Provider, MD   pravastatin (PRAVACHOL) 40 MG tablet TAKE (1) TABLET BY MOUTH AT BEDTIME. 12/26/13  Yes Satira Sark, MD  traZODone (DESYREL) 50 MG tablet Take 50 mg by mouth at bedtime.  06/06/14  Yes Historical Provider, MD     Allergies:  No Known Allergies  Social History:   reports that he quit smoking about 19 years ago. His smoking use included Cigarettes. He has a 80 pack-year smoking history. He has never used smokeless tobacco. He reports that he does not drink alcohol or use illicit drugs.  Family History: Family History  Problem Relation Age of Onset  . Heart disease    . Colon cancer Neg Hx      Physical Exam: Filed Vitals:   08/28/14 2238 08/28/14 2300 08/29/14 0000 08/29/14 0030  BP: 108/53 115/54 107/50 106/50  Pulse: 54 52 49 49  Temp:      TempSrc:      Resp: 18     Height:      Weight:      SpO2: 94% 99% 98% 97%   Blood pressure 106/50, pulse 49, temperature 99.8 F (37.7 C), temperature source  Rectal, resp. rate 18, height 5\' 9"  (1.753 m), weight 83.915 kg (185 lb), SpO2 97.00%.  GEN:  Pleasant  patient lying in the stretcher in no acute distress; cooperative with exam. PSYCH:  alert and oriented x4; does not appear anxious or depressed; affect is appropriate. HEENT: Mucous membranes pink and anicteric; PERRLA; EOM intact; no cervical lymphadenopathy nor thyromegaly or carotid bruit; no JVD; There were no stridor. Neck is very supple to my exam.  Tenderness posteriorly.  No carotid bruits. Breasts:: Not examined CHEST WALL: No tenderness CHEST: Normal respiration, clear to auscultation bilaterally.  HEART: Regular rate and rhythm.  There are no murmur, rub, or gallops.   BACK: No kyphosis or scoliosis; no CVA tenderness ABDOMEN: soft and non-tender; no masses, no organomegaly, normal abdominal bowel sounds; no pannus; no intertriginous candida. There is no rebound and no distention. Rectal Exam: Not done EXTREMITIES:  age-appropriate arthropathy of the hands and  knees; no edema; no ulcerations.  There is no calf tenderness. Left ankle was stiff. Genitalia: not examined PULSES: 2+ and symmetric SKIN: Normal hydration no rash or ulceration CNS: Cranial nerves 2-12 grossly intact no focal lateralizing neurologic deficit.  Speech is fluent; uvula elevated with phonation, facial symmetry and tongue midline. DTR are normal bilaterally, cerebella exam is intact, barbinski is negative and strengths are equaled bilaterally.  No sensory loss.   Labs on Admission:  Basic Metabolic Panel:  Recent Labs Lab 08/28/14 2112  NA 139  K 4.3  CL 96  CO2 33*  GLUCOSE 114*  BUN 9  CREATININE 0.70  CALCIUM 9.4   Liver Function Tests:  Recent Labs Lab 08/28/14 2112  AST 21  ALT 14  ALKPHOS 62  BILITOT 1.1  PROT 7.7  ALBUMIN 4.1   No results found for this basename: LIPASE, AMYLASE,  in the last 168 hours No results found for this basename: AMMONIA,  in the last 168 hours CBC:  Recent Labs Lab 08/28/14 2112  WBC 9.9  NEUTROABS 7.8*  HGB 13.7  HCT 40.7  MCV 97.1  PLT 208     Radiological Exams on Admission: Ct Head Wo Contrast  08/28/2014   CLINICAL DATA:  Left-sided neck pain and headaches  EXAM: CT HEAD WITHOUT CONTRAST  CT CERVICAL SPINE WITHOUT CONTRAST  TECHNIQUE: Multidetector CT imaging of the head and cervical spine was performed following the standard protocol without intravenous contrast. Multiplanar CT image reconstructions of the cervical spine were also generated.  COMPARISON:  06/27/2014  FINDINGS: CT HEAD FINDINGS  The bony calvarium is intact. Mild atrophic changes are noted. No findings to suggest acute hemorrhage, acute infarction or space-occupying mass lesion are noted.  CT CERVICAL SPINE FINDINGS  Seven cervical segments are well visualized. Mild osteophytic changes are noted. No acute fracture or acute facet abnormality is noted. The surrounding soft tissues are unremarkable. Mild facet hypertrophic changes are noted.   IMPRESSION: CT head:  Atrophic changes without acute abnormality.  CT of the cervical spine: Degenerative change without acute abnormality.   Electronically Signed   By: Inez Catalina M.D.   On: 08/28/2014 20:40   Ct Cervical Spine Wo Contrast  08/28/2014   CLINICAL DATA:  Left-sided neck pain and headaches  EXAM: CT HEAD WITHOUT CONTRAST  CT CERVICAL SPINE WITHOUT CONTRAST  TECHNIQUE: Multidetector CT imaging of the head and cervical spine was performed following the standard protocol without intravenous contrast. Multiplanar CT image reconstructions of the cervical spine were also generated.  COMPARISON:  06/27/2014  FINDINGS:  CT HEAD FINDINGS  The bony calvarium is intact. Mild atrophic changes are noted. No findings to suggest acute hemorrhage, acute infarction or space-occupying mass lesion are noted.  CT CERVICAL SPINE FINDINGS  Seven cervical segments are well visualized. Mild osteophytic changes are noted. No acute fracture or acute facet abnormality is noted. The surrounding soft tissues are unremarkable. Mild facet hypertrophic changes are noted.  IMPRESSION: CT head:  Atrophic changes without acute abnormality.  CT of the cervical spine: Degenerative change without acute abnormality.   Electronically Signed   By: Inez Catalina M.D.   On: 08/28/2014 20:40   Dg Chest Portable 1 View  08/28/2014   CLINICAL DATA:  NECK PAIN NECK PAIN  EXAM: PORTABLE CHEST - 1 VIEW  COMPARISON:  06/27/2014  FINDINGS: Previous CABG. Heart size upper limits normal. Mild diffuse interstitial edema or infiltrates, new increased since previous exam. No effusion.  IMPRESSION: 1. Mild bilateral interstitial edema or infiltrates, new since previous exam.   Electronically Signed   By: Arne Cleveland M.D.   On: 08/28/2014 23:39    Assessment/Plan Present on Admission:  . Neck pain on right side . ALZHEIMERS DISEASE . OBSTRUCTIVE SLEEP APNEA . Coronary atherosclerosis of native coronary artery . Neck pain   PLAN:  I am not  sure why he is having such a painful neck, and told the family that since he has dementia, and complained of pain everywhere also, that it is difficult to determine the etiology.  Though he had a spinal tap, I don't think he had a CNS infection.  It could just be musle strain, arthritic pain, radiculopathy, neuropathic pain, or a carotid dissection even.  He has no temporal tenderness or any palpable "cords" on his temporal arteries. Will obtain a neck CTA, and obtain an EKG.  I will give him some pain medicine and follow closely.  He can no longer be cared for at home, and will consult social service for placement.  Will obtain an EKG as it was not done.  He is otherwise stable, full code, and will be admitted to [facility].  Thank you for allowing me to participate in his care.  Other plans as per orders.  Code Status: FULL Haskel Khan, MD. [name] Pager [phone] 7pm to 7am.  08/29/2014, 1:43 AM

## 2014-08-29 NOTE — Clinical Social Work Placement (Signed)
Clinical Social Work Department CLINICAL SOCIAL WORK PLACEMENT NOTE 08/29/2014  Patient:  Dustin [name], Dustin [name]  Account Number:  [id number] Admit date:  08/28/2014  Clinical Social Worker:  [name], LCSW  Date/time:  08/29/2014 11:55 AM  Clinical Social Work is seeking post-discharge placement for this patient at the following level of care:   [place]   (*CSW will update this form in Epic as items are completed)   08/29/2014  Patient/family provided with [place] Department of Clinical Social Work's list of facilities offering this level of care within the geographic area requested by the patient (or if unable, by the patient's family).  08/29/2014  Patient/family informed of their freedom to choose among providers that offer the needed level of care, that participate in Medicare, Medicaid or managed care program needed by the patient, have an available bed and are willing to accept the patient.  08/29/2014  Patient/family informed of [facility]' ownership interest in [facility], as well as of the fact that they are under no obligation to receive care at this facility.  PASARR submitted to EDS on 08/29/2014 PASARR number received on 08/29/2014  FL2 transmitted to all facilities in geographic area requested by pt/family on  08/29/2014 FL2 transmitted to all facilities within larger geographic area on   Patient informed that his/her managed care company has contracts with or will negotiate with  certain facilities, including the following:     Patient/family informed of bed offers received:  08/29/2014 Patient chooses bed at [facility] Physician recommends and patient chooses bed at  [facility]  Patient to be transferred to [facility] on  08/29/2014 Patient to be transferred to facility by RN Patient and family notified of transfer on 08/29/2014 Name of family member notified:  Everlene Farrier- wife  The following physician request were  entered in Epic:   Additional Comments:  [name], [place]

## 2014-08-29 NOTE — Clinical Social Work Placement (Signed)
Clinical Social Work Department CLINICAL SOCIAL WORK PLACEMENT NOTE 08/29/2014  Patient:  KENRIC, GINGER  Account Number:  [id number] Admit date:  08/28/2014  Clinical Social Worker:  [name], LCSW  Date/time:  08/29/2014 11:55 AM  Clinical Social Work is seeking post-discharge placement for this patient at the following level of care:   [place]   (*CSW will update this form in Epic as items are completed)   08/29/2014  Patient/family provided with [place] Department of Clinical Social Work's list of facilities offering this level of care within the geographic area requested by the patient (or if unable, by the patient's family).  08/29/2014  Patient/family informed of their freedom to choose among providers that offer the needed level of care, that participate in Medicare, Medicaid or managed care program needed by the patient, have an available bed and are willing to accept the patient.  08/29/2014  Patient/family informed of [facility]' ownership interest in [facility], as well as of the fact that they are under no obligation to receive care at this facility.  PASARR submitted to EDS on 08/29/2014 PASARR number received on 08/29/2014  FL2 transmitted to all facilities in geographic area requested by pt/family on  08/29/2014 FL2 transmitted to all facilities within larger geographic area on   Patient informed that his/her managed care company has contracts with or will negotiate with  certain facilities, including the following:     Patient/family informed of bed offers received:   Patient chooses bed at  Physician recommends and patient chooses bed at    Patient to be transferred to  on   Patient to be transferred to facility by  Patient and family notified of transfer on  Name of family member notified:    The following physician request were entered in Epic:   Additional Comments:  [name], [place]

## 2014-08-29 NOTE — Care Management Note (Addendum)
    Page 1 of 1   08/29/2014     3:31:24 PM CARE MANAGEMENT NOTE 08/29/2014  Patient:  Dustin [name], Dustin [name]   Account Number:  [id number]  Date Initiated:  08/29/2014  Documentation initiated by:  Jolene Provost  Subjective/Objective Assessment:   Pt is from home with wife.     Action/Plan:   Pt plans to discharge to [facility]. No CM needs at this time.   Anticipated DC Date:  08/29/2014   Anticipated DC Plan:  SKILLED NURSING FACILITY  In-house referral  Clinical Social Worker      DC Planning Services  CM consult      Choice offered to / List presented to:             Status of service:  Completed, signed off Medicare Important Message given?   (If response is "NO", the following Medicare IM given date fields will be blank) Date Medicare IM given:   Medicare IM given by:   Date Additional Medicare IM given:   Additional Medicare IM given by:    Discharge Disposition:  [place]  Per UR Regulation:    If discussed at Long Length of Stay Meetings, dates discussed:    Comments:  08/29/2014 [place], RN, MSN, [facility]

## 2014-08-29 NOTE — ED Provider Notes (Signed)
Medical screening examination/treatment/procedure(s) were conducted as a shared visit with non-physician practitioner(s) and myself.  I personally evaluated the patient during the encounter.  See my additional note.   EKG Interpretation   Date/Time:  Thursday August 29 2014 01:48:19 EDT Ventricular Rate:  48 PR Interval:  195 QRS Duration: 130 QT Interval:  481 QTC Calculation: 430 R Axis:   10 Text Interpretation:  Sinus bradycardia Left bundle branch block T wave  abnormality No significant change since last tracing Confirmed by Christy Gentles   MD, DONALD ([id number]) on 08/29/2014 1:55:01 AM       Ezequiel Essex, MD 08/29/14 [phone]

## 2014-08-29 NOTE — Evaluation (Signed)
Clinical/Bedside Swallow Evaluation Patient Details  Name: Dustin [name] MRN: [id number] Date of Birth: 1934-08-24  Today's Date: 08/29/2014 Time: 1287-8676 SLP Time Calculation (min): 17 min  Past Medical History:  Past Medical History  Diagnosis Date  . Arthritis   . Coronary atherosclerosis of native coronary artery     a. Multivessel s/p CABG 1996. b. s/p DES x 2 SVG to PDA 8/12 with distal disease managed medically.  . Mixed hyperlipidemia   . Essential hypertension, benign   . MI (myocardial infarction)   . Hematuria   . OSA (obstructive sleep apnea)   . Anxiety   . OA (osteoarthritis)   . DDD (degenerative disc disease)     Chronic back pain  . Alzheimer disease   . Enlarged prostate   . Hypothyroidism   . Atrophic gastritis     a. By EGD 02/2013.   Past Surgical History:  Past Surgical History  Procedure Laterality Date  . Coronary artery bypass graft  1996    LIMA to LAD, SVG to D2, SVG to PDA, SVG to OM1 and OM2  . Hernia repair    . Coronary angioplasty with stent placement    . Colonoscopy  08/03/2004    Jenkins-numerous large diverticula in the descending, transverse, descending, and sigmoid colon. Otherwise normal exam.  . Colonoscopy  07/12/2012    RMR: External hemorrhoidal tag; multiple rectal and colonic polyps removed and/or treated as described above. Pancolonic diverticulosis. Bx-tubular adenomas, rectal hyperplastic polyp. next colonoscopy in 06/2015.  [name] Esophagogastroduodenoscopy N/A 03/16/2013    Procedure: ESOPHAGOGASTRODUODENOSCOPY (EGD);  Surgeon: Daneil Dolin, MD;  Location: [facility];  Service: Endoscopy;  Laterality: N/A;  12:00-moved to [place] notified pt  . Esophagogastroduodenoscopy N/A 03/06/2013    Procedure: ESOPHAGOGASTRODUODENOSCOPY (EGD);  Surgeon: Daneil Dolin, MD;  Location: [facility];  Service: Endoscopy;  Laterality: N/A;   HPI:  Dustin [name] is an 80 y.o. male with hx of dementia, mild to moderate, on Aricept  and Namenda, hx of CAD, HTN, OSA, OA, Hypothyroidism, presented to the ER with neck pain acutely for 2 days, without hx of trauma. He had no fever, chills, nausea, vomiting, or chest pain. He denied any neurological symptoms. Wife stated 2 days prior, he was out pulling grass, and he has been driving. He does have hx of neuropathy, and has been on Gabapentin and lyrica, along with Fentanyl patch at 100mg /hour/72 hrs. Evalaution in the ER showed head CT and neck CT without contrast negative without any acute process. He had no fever and no leukocytosis. An LP was done and it was negative.Neck pain on right side: likely musculoskeletal-patient has pijtn tenderness over the L adjacent paravertebral musculature c6-c76. CT cervical spine confirms some osetophytes. CT angio neck no dissection or other acute abnormality identified within the major arterial vasculature of the neck. Atheromatous disease within the proximal internal carotid arteries with associated stenoses of approximately 30-40% by NASCET criteria. No high-grade hemodynamically significant stenosis identified. LP in ED was negative. Continue flexaril and warm compresses. Recommend daily PT as well. Being discharge to [place] [facility].   Assessment / Plan / Recommendation Clinical Impression  Dustin [name] was sleeping upon SLP arrival, but roused to voice. RN reported that pt had recently been given a muscle relaxant and may be groggy. Indeed he was, but willingly participated in oral motor examination and po trials with cues for attention to task. PA, Dyanne Carrel reported that pt had wet vocal quality  when she saw him after breakfast meal. Nurse tech fed him lunch and reported that he ate nearly 100% without difficulty. Dustin [name] speech was dysarthric most likely due to medication effect and his responses were delayed. He was able to follow simple commands and had no visual asymmetry. He required cues for attention to task at times, but drank 120 ml  water, 2 oz applesauce and graham crackers with one episode of coughing after sequential straw sips water. When pt cued to take small, single sips he had no difficulty. Recommend continuing diet as ordered, but f/u at SNF with SLP for diet tolerance due to medication effect at this time. Pt should only eat when most alert and upright with cues to take small sips. [name] in agreement with plan and discussed with RN    Aspiration Risk  Mild    Diet Recommendation Regular;Thin liquid   Liquid Administration via: Cup;Straw Medication Administration: Whole meds with liquid Supervision: Patient able to self feed;Full supervision/cueing for compensatory strategies Compensations: Small sips/bites Postural Changes and/or Swallow Maneuvers: Out of bed for meals;Seated upright 90 degrees;Upright 30-60 min after meal    Other  Recommendations Oral Care Recommendations: Oral care BID Other Recommendations: Clarify dietary restrictions   Follow Up Recommendations  Skilled Nursing facility        Swallow Study Prior Functional Status   Living at home with his wife, mild dementia    General Date of Onset: 08/28/14 HPI: Dustin [name] is an 80 y.o. male with hx of dementia, mild to moderate, on Aricept and Namenda, hx of CAD, HTN, OSA, OA, Hypothyroidism, presented to the ER with neck pain acutely for 2 days, without hx of trauma. He had no fever, chills, nausea, vomiting, or chest pain. He denied any neurological symptoms. Wife stated 2 days prior, he was out pulling grass, and he has been driving. He does have hx of neuropathy, and has been on Gabapentin and lyrica, along with Fentanyl patch at 100mg /hour/72 hrs. Evalaution in the ER showed head CT and neck CT without contrast negative without any acute process. He had no fever and no leukocytosis. An LP was done and it was negative.Neck pain on right side: likely musculoskeletal-patient has pijtn tenderness over the L adjacent paravertebral  musculature c6-c76. CT cervical spine confirms some osetophytes. CT angio neck no dissection or other acute abnormality identified within the major arterial vasculature of the neck. Atheromatous disease within the proximal internal carotid arteries with associated stenoses of approximately 30-40% by NASCET criteria. No high-grade hemodynamically significant stenosis identified. LP in ED was negative. Continue flexaril and warm compresses. Recommend daily PT as well. Being discharge to [facility]. Type of Study: Bedside swallow evaluation Previous Swallow Assessment: None on record Diet Prior to this Study: Regular;Thin liquids Temperature Spikes Noted: No Respiratory Status: Room air Behavior/Cognition: Cooperative;Pleasant mood;Decreased sustained attention Oral Cavity - Dentition: Adequate natural dentition Self-Feeding Abilities: Needs assist (secondary to lethargy likely from [facility]) Patient Positioning: Upright in bed Baseline Vocal Quality: Clear (intermittent wetness) Volitional Cough: Weak Volitional Swallow: Able to elicit    Oral/Motor/Sensory Function Overall Oral Motor/Sensory Function: Impaired Labial ROM: Reduced right;Reduced left (likely more related to medication effects) Labial Symmetry: Within Functional Limits Labial Strength: Within Functional Limits Labial Sensation: Within Functional Limits Lingual ROM: Reduced right;Reduced left Lingual Symmetry: Within Functional Limits Lingual Strength: Reduced Lingual Sensation: Within Functional Limits Facial ROM: Within Functional Limits Facial Symmetry: Within Functional Limits Facial Strength: Within Functional Limits Facial Sensation: Within Functional  Limits Velum: Within Functional Limits Mandible: Within Functional Limits   Ice Chips Ice chips: Impaired Presentation: Spoon Oral Phase Impairments: Poor awareness of bolus Oral Phase Functional Implications: Oral holding Pharyngeal Phase Impairments:  Decreased hyoid-laryngeal movement   Thin Liquid Thin Liquid: Impaired Presentation: Cup;Straw Pharyngeal  Phase Impairments: Suspected delayed Swallow;Decreased hyoid-laryngeal movement;Cough - Immediate (cough immediate x1 with sequential straw sips)    Nectar Thick Nectar Thick Liquid: Not tested   Honey Thick Honey Thick Liquid: Not tested   Puree Puree: Within functional limits Presentation: Spoon   Solid   GO Functional Assessment Tool Used: clinical judgement Functional Limitations: Swallowing Swallow Current Status (K3838): At least 1 percent but less than 20 percent impaired, limited or restricted Swallow Goal Status (F8403): 0 percent impaired, limited or restricted Swallow Discharge Status [phone]): At least 1 percent but less than 20 percent impaired, limited or restricted  Solid: Impaired Presentation: Spoon Oral Phase Impairments: Poor awareness of bolus (needed cues for alertness) Oral Phase Functional Implications: Oral holding        Thank you,  Genene Churn, [place]  PORTER,DABNEY 08/29/2014,4:24 PM

## 2014-08-29 NOTE — Progress Notes (Signed)
Pt discharged to [facility] today per Dr. Verlon Au. Pt's IV site D/C'd and WDL. Pt's VSS. Report called to [facility], nurse at [facility]. Verbalized understanding. Pt left floor via hospital bed in stable condition accompanied by NT.

## 2014-08-29 NOTE — Discharge Summary (Signed)
Physician Discharge Summary  Dustin [name]:[id number] DOB: 05-30-1934 DOA: 08/28/2014  PCP: Glo Herring., MD  Admit date: 08/28/2014 Discharge date: 08/29/2014  Time spent: 40 minutes  Recommendations for Outpatient Follow-up:  1. PCP 1 week for evaluation of neck pain and monitoring of cognition 2. Discharge to [facility]. Recommend daily PT for strength and endurance  Discharge Diagnoses:  Principal Problem:   Neck pain on right side Active Problems:   OBSTRUCTIVE SLEEP APNEA   ALZHEIMERS DISEASE   Coronary atherosclerosis of native coronary artery   Neck pain   Discharge Condition: stable  Diet recommendation: regular  Filed Weights   08/28/14 1755 08/29/14 0257  Weight: 83.915 kg (185 lb) 88.5 kg (195 lb 1.7 oz)    History of present illness:  Dustin [name] is an 80 y.o. male with hx of dementia, mild to moderate, on Aricept and Namenda, hx of CAD, HTN, OSA, OA, Hypothyroidism, presented to the ER with neck pain acutely for 2 days, without hx of trauma. He had no fever, chills, nausea, vomiting, or chest pain. He denied any neurological symptoms. Wife stated 2 days prior, he was out pulling grass, and he has been driving. He does have hx of neuropathy, and has been on Gabapentin and lyrica, along with Fentanyl patch at 100mg /hour/72 hrs. Evalaution in the ER showed head CT and neck CT without contrast negative without any acute process. He had no fever and no leukocytosis. An LP was done and it was negative.   Hospital Course:  . Neck pain on right side: likely musculoskeletal-patient has pijtn tenderness over the L adjacent paravertebral musculature c6-c76.  CT cervical spine confirms some osetophytes. CT angio neck no dissection or other acute abnormality identified within the major arterial vasculature of the neck. Atheromatous disease within the proximal internal carotid arteries with associated stenoses of approximately 30-40% by NASCET criteria. No high-grade  hemodynamically significant stenosis identified. LP in ED was negative. Continue flexaril and warm compresses. Recommend daily PT as well. Being discharge to [place] [facility].   Dustin [name] ALZHEIMERS DISEASE: stable at baseline. Has become difficult for wife to provide care due to her health.   . OBSTRUCTIVE SLEEP APNEA-unclear if will comply with Bipap at home.  Continue chronic o2-desatted to the 80's without oxygen  . Coronary atherosclerosis of native coronary artery: no chest pain. Recently seen in cardiology office. Recommended continuation of medical management    Procedures:  LP   Consultations:  none  Discharge Exam: Filed Vitals:   08/29/14 0846  BP: 130/46  Pulse: 55  Temp: 98.7 F (37.1 C)  Resp: 18    General: well nourished. Appears comfortable Cardiovascular: RRR faint murmur. No rub no LE edema Respiratory: normal effort BS clear bilaterally no wheeze MS: neck supple but decreased rom due to pain. Mild tenderness to palpation.   Discharge Instructions You were cared for by a hospitalist during your hospital stay. If you have any questions about your discharge medications or the care you received while you were in the hospital after you are discharged, you can call the unit and asked to speak with the hospitalist on call if the hospitalist that took care of you is not available. Once you are discharged, your primary care physician will handle any further medical issues. Please note that NO REFILLS for any discharge medications will be authorized once you are discharged, as it is imperative that you return to your primary care physician (or establish a relationship with a primary care  physician if you do not have one) for your aftercare needs so that they can reassess your need for medications and monitor your lab values.   Current Discharge Medication List    START taking these medications   Details  cyclobenzaprine (FLEXERIL) 5 MG tablet Take 1 tablet (5 mg total)  by mouth 2 (two) times daily as needed for muscle spasms. Qty: 30 tablet, Refills: 0      CONTINUE these medications which have CHANGED   Details  fentaNYL (DURAGESIC - DOSED MCG/HR) 100 MCG/HR Place 1 patch (100 mcg total) onto the skin every 3 (three) days. Qty: 5 patch, Refills: 0    oxyCODONE (ROXICODONE) 15 MG immediate release tablet Take 1 tablet (15 mg total) by mouth every 6 (six) hours as needed for pain. Qty: 30 tablet, Refills: 0      CONTINUE these medications which have NOT CHANGED   Details  ALPRAZolam (XANAX) 1 MG tablet Take 1 mg by mouth daily as needed. For anxiety     amLODipine (NORVASC) 10 MG tablet Take 10 mg by mouth daily.    aspirin EC 81 MG tablet Take 1 tablet (81 mg total) by mouth daily. Qty: 30 tablet, Refills: 3    carvedilol (COREG) 3.125 MG tablet Take 1 tablet by mouth 2 (two) times daily.    clopidogrel (PLAVIX) 75 MG tablet Take 75 mg by mouth daily.     donepezil (ARICEPT) 10 MG tablet Take 1 tablet by mouth At bedtime.    escitalopram (LEXAPRO) 10 MG tablet Take 10 mg by mouth daily.    gabapentin (NEURONTIN) 100 MG capsule Take 100 mg by mouth daily.    hydrochlorothiazide (,MICROZIDE/HYDRODIURIL,) 12.5 MG capsule Take 12.5 mg by mouth daily.     levothyroxine (SYNTHROID, LEVOTHROID) 25 MCG tablet Take 25 mcg by mouth daily.     LYRICA 75 MG capsule Take 1 capsule by mouth 3 (three) times daily.    NAMENDA 5 MG tablet Take 5 mg by mouth daily.     nitroGLYCERIN (NITROSTAT) 0.4 MG SL tablet Place 0.4 mg under the tongue every 5 (five) minutes as needed. For chest pains    pravastatin (PRAVACHOL) 40 MG tablet TAKE (1) TABLET BY MOUTH AT BEDTIME. Qty: 30 tablet, Refills: 6    traZODone (DESYREL) 50 MG tablet Take 50 mg by mouth at bedtime.        No Known Allergies Follow-up Information   Follow up with Glo Herring., MD. Schedule an appointment as soon as possible for a visit in 1 week. (for follow up to neck discomfort and  monitoring of dementia)    Specialty:  Internal Medicine   Contact information:   [address] [place] [address] [phone]        The results of significant diagnostics from this hospitalization (including imaging, microbiology, ancillary and laboratory) are listed below for reference.    Significant Diagnostic Studies: Ct Head Wo Contrast  08/28/2014   CLINICAL DATA:  Left-sided neck pain and headaches  EXAM: CT HEAD WITHOUT CONTRAST  CT CERVICAL SPINE WITHOUT CONTRAST  TECHNIQUE: Multidetector CT imaging of the head and cervical spine was performed following the standard protocol without intravenous contrast. Multiplanar CT image reconstructions of the cervical spine were also generated.  COMPARISON:  06/27/2014  FINDINGS: CT HEAD FINDINGS  The bony calvarium is intact. Mild atrophic changes are noted. No findings to suggest acute hemorrhage, acute infarction or space-occupying mass lesion are noted.  CT CERVICAL SPINE FINDINGS  Seven  cervical segments are well visualized. Mild osteophytic changes are noted. No acute fracture or acute facet abnormality is noted. The surrounding soft tissues are unremarkable. Mild facet hypertrophic changes are noted.  IMPRESSION: CT head:  Atrophic changes without acute abnormality.  CT of the cervical spine: Degenerative change without acute abnormality.   Electronically Signed   By: Inez Catalina M.D.   On: 08/28/2014 20:40   Ct Angio Neck W/cm &/or Wo/cm  08/29/2014   CLINICAL DATA:  NECK PAIN  EXAM: CT ANGIOGRAPHY NECK  TECHNIQUE: Multidetector CT imaging of the neck was performed using the standard protocol during bolus administration of intravenous contrast. Multiplanar CT image reconstructions and MIPs were obtained to evaluate the vascular anatomy. Carotid stenosis measurements (when applicable) are obtained utilizing NASCET criteria, using the distal internal carotid diameter as the denominator.  CONTRAST:  81mL OMNIPAQUE IOHEXOL 350 MG/ML  SOLN  COMPARISON:  Prior CT cervical spine from 08/28/2014  FINDINGS: The visualized aortic arch is of normal caliber with normal 3 vessel morphology. Scattered calcified atheromatous disease present within the aortic arch. No high-grade stenosis seen at the origin of the great vessels. Subclavian arteries are well opacified.  The common carotid arteries are symmetric in caliber without occlusion, dissection, or hemodynamically significant stenosis. Prominent calcified atheromatous disease present about the carotid bifurcations bilaterally.  Prominent calcified plaque with associated short segment stenosis of approximately 30-40% seen within the proximal right internal carotid artery (series 8, image 81). The right internal carotid artery is well opacified distally without evidence of dissection or other hemodynamically significant stenosis.  Calcified and noncalcified plaque with resultant relatively smooth stenosis of approximately 30-40% seen within the proximal left internal carotid artery (series 8, image 87). The left internal carotid arteries otherwise well opacified without evidence of dissection or other acute abnormality.  Both vertebral arteries arise from the subclavian arteries. Vascular calcifications seen at the origin of the vertebral arteries bilaterally. The right vertebral artery is dominant. No evidence of vertebral artery dissection or occlusion. No hemodynamically significant stenosis seen within the vertebral arteries bilaterally.  Visualized superior mediastinum within normal limits. Atelectatic changes noted within the visualized lungs. Thyroid gland within normal limits. No adenopathy within the neck.  No acute osseous abnormality. No worrisome lytic or blastic osseous lesions.  IMPRESSION: 1. No dissection or other acute abnormality identified within the major arterial vasculature of the neck. 2. Atheromatous disease within the proximal internal carotid arteries with associated stenoses of  approximately 30-40% by NASCET criteria. No high-grade hemodynamically significant stenosis identified.   Electronically Signed   By: Jeannine Boga M.D.   On: 08/29/2014 03:35   Ct Cervical Spine Wo Contrast  08/28/2014   CLINICAL DATA:  Left-sided neck pain and headaches  EXAM: CT HEAD WITHOUT CONTRAST  CT CERVICAL SPINE WITHOUT CONTRAST  TECHNIQUE: Multidetector CT imaging of the head and cervical spine was performed following the standard protocol without intravenous contrast. Multiplanar CT image reconstructions of the cervical spine were also generated.  COMPARISON:  06/27/2014  FINDINGS: CT HEAD FINDINGS  The bony calvarium is intact. Mild atrophic changes are noted. No findings to suggest acute hemorrhage, acute infarction or space-occupying mass lesion are noted.  CT CERVICAL SPINE FINDINGS  Seven cervical segments are well visualized. Mild osteophytic changes are noted. No acute fracture or acute facet abnormality is noted. The surrounding soft tissues are unremarkable. Mild facet hypertrophic changes are noted.  IMPRESSION: CT head:  Atrophic changes without acute abnormality.  CT of the cervical spine: Degenerative  change without acute abnormality.   Electronically Signed   By: Inez Catalina M.D.   On: 08/28/2014 20:40   Dg Chest Portable 1 View  08/28/2014   CLINICAL DATA:  NECK PAIN NECK PAIN  EXAM: PORTABLE CHEST - 1 VIEW  COMPARISON:  06/27/2014  FINDINGS: Previous CABG. Heart size upper limits normal. Mild diffuse interstitial edema or infiltrates, new increased since previous exam. No effusion.  IMPRESSION: 1. Mild bilateral interstitial edema or infiltrates, new since previous exam.   Electronically Signed   By: Arne Cleveland M.D.   On: 08/28/2014 23:39    Microbiology: Recent Results (from the past 240 hour(s))  CSF CULTURE     Status: None   Collection Time    08/28/14 11:00 PM      Result Value Ref Range Status   Specimen Description CSF   Final   Special Requests Normal    Final   Gram Stain     Final   Value: NO ORGANISMS SEEN     NO WBC SEEN   Culture PENDING   Incomplete   Report Status PENDING   Incomplete     Labs: Basic Metabolic Panel:  Recent Labs Lab 08/28/14 2112  NA 139  K 4.3  CL 96  CO2 33*  GLUCOSE 114*  BUN 9  CREATININE 0.70  CALCIUM 9.4   Liver Function Tests:  Recent Labs Lab 08/28/14 2112  AST 21  ALT 14  ALKPHOS 62  BILITOT 1.1  PROT 7.7  ALBUMIN 4.1   No results found for this basename: LIPASE, AMYLASE,  in the last 168 hours No results found for this basename: AMMONIA,  in the last 168 hours CBC:  Recent Labs Lab 08/28/14 2112  WBC 9.9  NEUTROABS 7.8*  HGB 13.7  HCT 40.7  MCV 97.1  PLT 208   Cardiac Enzymes: No results found for this basename: CKTOTAL, CKMB, CKMBINDEX, TROPONINI,  in the last 168 hours BNP: BNP (last 3 results) No results found for this basename: PROBNP,  in the last 8760 hours CBG: No results found for this basename: GLUCAP,  in the last 168 hours     Signed:  Radene Gunning  [name] 08/29/2014, 1:34 PM    I agree with the History/assesment & plan per Midlevel provider as per above, and independently assessed and discussed the plan of care with the patient, except where as noted in the above note See above note for addendum   Verneita Griffes, MD [name] (P) [phone]

## 2014-08-30 ENCOUNTER — Emergency Department (HOSPITAL_COMMUNITY): Payer: Medicare Other

## 2014-08-30 ENCOUNTER — Other Ambulatory Visit: Payer: Self-pay | Admitting: *Deleted

## 2014-08-30 ENCOUNTER — Inpatient Hospital Stay (HOSPITAL_COMMUNITY)
Admission: EM | Admit: 2014-08-30 | Discharge: 2014-09-03 | DRG: 871 | Disposition: A | Discharge status: Home Health | Payer: Medicare Other | Attending: Family Medicine | Admitting: Family Medicine

## 2014-08-30 ENCOUNTER — Inpatient Hospital Stay (HOSPITAL_COMMUNITY): Payer: Medicare Other

## 2014-08-30 ENCOUNTER — Encounter (HOSPITAL_COMMUNITY): Payer: Self-pay | Admitting: Emergency Medicine

## 2014-08-30 DIAGNOSIS — M1990 Unspecified osteoarthritis, unspecified site: Secondary | ICD-10-CM | POA: Diagnosis present

## 2014-08-30 DIAGNOSIS — I2510 Atherosclerotic heart disease of native coronary artery without angina pectoris: Secondary | ICD-10-CM | POA: Diagnosis present

## 2014-08-30 DIAGNOSIS — Z87891 Personal history of nicotine dependence: Secondary | ICD-10-CM

## 2014-08-30 DIAGNOSIS — I509 Heart failure, unspecified: Secondary | ICD-10-CM | POA: Diagnosis present

## 2014-08-30 DIAGNOSIS — F0280 Dementia in other diseases classified elsewhere without behavioral disturbance: Secondary | ICD-10-CM | POA: Diagnosis present

## 2014-08-30 DIAGNOSIS — N400 Enlarged prostate without lower urinary tract symptoms: Secondary | ICD-10-CM | POA: Diagnosis present

## 2014-08-30 DIAGNOSIS — A419 Sepsis, unspecified organism: Secondary | ICD-10-CM | POA: Diagnosis present

## 2014-08-30 DIAGNOSIS — M549 Dorsalgia, unspecified: Secondary | ICD-10-CM | POA: Diagnosis present

## 2014-08-30 DIAGNOSIS — G92 Toxic encephalopathy: Secondary | ICD-10-CM | POA: Diagnosis present

## 2014-08-30 DIAGNOSIS — F0390 Unspecified dementia without behavioral disturbance: Secondary | ICD-10-CM | POA: Diagnosis present

## 2014-08-30 DIAGNOSIS — G8929 Other chronic pain: Secondary | ICD-10-CM | POA: Diagnosis present

## 2014-08-30 DIAGNOSIS — K5909 Other constipation: Secondary | ICD-10-CM | POA: Diagnosis present

## 2014-08-30 DIAGNOSIS — E782 Mixed hyperlipidemia: Secondary | ICD-10-CM | POA: Diagnosis present

## 2014-08-30 DIAGNOSIS — I69998 Other sequelae following unspecified cerebrovascular disease: Secondary | ICD-10-CM | POA: Diagnosis not present

## 2014-08-30 DIAGNOSIS — J159 Unspecified bacterial pneumonia: Secondary | ICD-10-CM | POA: Diagnosis present

## 2014-08-30 DIAGNOSIS — T4275XA Adverse effect of unspecified antiepileptic and sedative-hypnotic drugs, initial encounter: Secondary | ICD-10-CM | POA: Diagnosis present

## 2014-08-30 DIAGNOSIS — E876 Hypokalemia: Secondary | ICD-10-CM | POA: Diagnosis present

## 2014-08-30 DIAGNOSIS — Z79899 Other long term (current) drug therapy: Secondary | ICD-10-CM | POA: Diagnosis not present

## 2014-08-30 DIAGNOSIS — Z7902 Long term (current) use of antithrombotics/antiplatelets: Secondary | ICD-10-CM | POA: Diagnosis not present

## 2014-08-30 DIAGNOSIS — G309 Alzheimer's disease, unspecified: Secondary | ICD-10-CM | POA: Diagnosis present

## 2014-08-30 DIAGNOSIS — I252 Old myocardial infarction: Secondary | ICD-10-CM | POA: Diagnosis not present

## 2014-08-30 DIAGNOSIS — Z91199 Patient's noncompliance with other medical treatment and regimen due to unspecified reason: Secondary | ICD-10-CM

## 2014-08-30 DIAGNOSIS — R509 Fever, unspecified: Secondary | ICD-10-CM | POA: Diagnosis present

## 2014-08-30 DIAGNOSIS — D126 Benign neoplasm of colon, unspecified: Secondary | ICD-10-CM | POA: Diagnosis present

## 2014-08-30 DIAGNOSIS — Z951 Presence of aortocoronary bypass graft: Secondary | ICD-10-CM | POA: Diagnosis not present

## 2014-08-30 DIAGNOSIS — G4733 Obstructive sleep apnea (adult) (pediatric): Secondary | ICD-10-CM | POA: Diagnosis present

## 2014-08-30 DIAGNOSIS — G929 Unspecified toxic encephalopathy: Secondary | ICD-10-CM | POA: Diagnosis present

## 2014-08-30 DIAGNOSIS — I10 Essential (primary) hypertension: Secondary | ICD-10-CM | POA: Diagnosis present

## 2014-08-30 DIAGNOSIS — E785 Hyperlipidemia, unspecified: Secondary | ICD-10-CM | POA: Diagnosis present

## 2014-08-30 DIAGNOSIS — I5032 Chronic diastolic (congestive) heart failure: Secondary | ICD-10-CM | POA: Diagnosis present

## 2014-08-30 DIAGNOSIS — Z9119 Patient's noncompliance with other medical treatment and regimen: Secondary | ICD-10-CM

## 2014-08-30 DIAGNOSIS — E039 Hypothyroidism, unspecified: Secondary | ICD-10-CM | POA: Diagnosis present

## 2014-08-30 DIAGNOSIS — J9600 Acute respiratory failure, unspecified whether with hypoxia or hypercapnia: Secondary | ICD-10-CM | POA: Diagnosis present

## 2014-08-30 DIAGNOSIS — Z7982 Long term (current) use of aspirin: Secondary | ICD-10-CM | POA: Diagnosis not present

## 2014-08-30 DIAGNOSIS — G9341 Metabolic encephalopathy: Secondary | ICD-10-CM | POA: Diagnosis present

## 2014-08-30 DIAGNOSIS — F3289 Other specified depressive episodes: Secondary | ICD-10-CM | POA: Diagnosis present

## 2014-08-30 DIAGNOSIS — F329 Major depressive disorder, single episode, unspecified: Secondary | ICD-10-CM | POA: Diagnosis present

## 2014-08-30 DIAGNOSIS — R531 Weakness: Secondary | ICD-10-CM | POA: Diagnosis present

## 2014-08-30 LAB — TROPONIN I: Troponin I: <0.3 ng/mL (ref <0.30)

## 2014-08-30 LAB — COMPREHENSIVE METABOLIC PANEL
ALBUMIN: 3.3 g/dL — AB (ref 3.5–5.2)
ALT: 11 U/L (ref 0–53)
AST: 14 U/L (ref 0–37)
Alkaline Phosphatase: 45 U/L (ref 39–117)
Anion gap: 7 (ref 5–15)
BUN: 20 mg/dL (ref 6–23)
CALCIUM: 8.9 mg/dL (ref 8.4–10.5)
CO2: 35 mEq/L — ABNORMAL HIGH (ref 19–32)
CREATININE: 0.86 mg/dL (ref 0.50–1.35)
Chloride: 97 mEq/L (ref 96–112)
GFR calc Af Amer: >90 mL/min (ref >90)
GFR calc non Af Amer: 82 mL/min — ABNORMAL LOW (ref >90)
Glucose, Bld: 143 mg/dL — ABNORMAL HIGH (ref 70–99)
Potassium: 4.3 mEq/L (ref 3.7–5.3)
Sodium: 139 mEq/L (ref 137–147)
TOTAL PROTEIN: 6.7 g/dL (ref 6.0–8.3)
Total Bilirubin: 1.2 mg/dL (ref 0.3–1.2)

## 2014-08-30 LAB — BLOOD GAS, ARTERIAL
Acid-Base Excess: 3.9 mmol/L — ABNORMAL HIGH (ref 0.0–2.0)
Acid-Base Excess: 5 mmol/L — ABNORMAL HIGH (ref 0.0–2.0)
BICARBONATE: 31.8 meq/L — AB (ref 20.0–24.0)
BICARBONATE: 31.7 meq/L — AB (ref 20.0–24.0)
Drawn by: 234301
Drawn by: 234301
Expiratory PAP: 8
FIO2: 100 %
FIO2: 100 %
Inspiratory PAP: 22
Mode: POSITIVE
O2 SAT: 97.9 %
O2 Saturation: 98.1 %
PCO2 ART: 72.9 mmHg — AB (ref 35.0–45.0)
PH ART: 7.261 — AB (ref 7.350–7.450)
PO2 ART: 139 mmHg — AB (ref 80.0–100.0)
PO2 ART: 127 mmHg — AB (ref 80.0–100.0)
Patient temperature: 37
Patient temperature: 37
TCO2: 29.5 mmol/L (ref 0–100)
TCO2: 29.5 mmol/L (ref 0–100)
pCO2 arterial: 88.2 mmHg (ref 35.0–45.0)
pH, Arterial: 7.182 — CL (ref 7.350–7.450)

## 2014-08-30 LAB — CBC WITH DIFFERENTIAL/PLATELET
BASOS ABS: 0 10*3/uL (ref 0.0–0.1)
BASOS PCT: 0 % (ref 0–1)
EOS ABS: 0.1 10*3/uL (ref 0.0–0.7)
EOS PCT: 1 % (ref 0–5)
HEMATOCRIT: 37.2 % — AB (ref 39.0–52.0)
Hemoglobin: 12.3 g/dL — ABNORMAL LOW (ref 13.0–17.0)
Lymphocytes Relative: 14 % (ref 12–46)
Lymphs Abs: 1.3 10*3/uL (ref 0.7–4.0)
MCH: 32.3 pg (ref 26.0–34.0)
MCHC: 33.1 g/dL (ref 30.0–36.0)
MCV: 97.6 fL (ref 78.0–100.0)
MONO ABS: 1 10*3/uL (ref 0.1–1.0)
Monocytes Relative: 11 % (ref 3–12)
NEUTROS ABS: 6.9 10*3/uL (ref 1.7–7.7)
Neutrophils Relative %: 74 % (ref 43–77)
Platelets: 183 10*3/uL (ref 150–400)
RBC: 3.81 MIL/uL — ABNORMAL LOW (ref 4.22–5.81)
RDW: 12.1 % (ref 11.5–15.5)
WBC: 9.2 10*3/uL (ref 4.0–10.5)

## 2014-08-30 LAB — URINALYSIS, ROUTINE W REFLEX MICROSCOPIC
Bilirubin Urine: NEGATIVE
Glucose, UA: NEGATIVE mg/dL
LEUKOCYTES UA: NEGATIVE
NITRITE: NEGATIVE
Protein, ur: NEGATIVE mg/dL
SPECIFIC GRAVITY, URINE: 1.025 (ref 1.005–1.030)
UROBILINOGEN UA: 0.2 mg/dL (ref 0.0–1.0)
pH: 5.5 (ref 5.0–8.0)

## 2014-08-30 LAB — URINE MICROSCOPIC-ADD ON

## 2014-08-30 LAB — PRO B NATRIURETIC PEPTIDE: PRO B NATRI PEPTIDE: 1378 pg/mL — AB (ref 0–450)

## 2014-08-30 LAB — MRSA PCR SCREENING: MRSA by PCR: NEGATIVE

## 2014-08-30 LAB — LACTIC ACID, PLASMA: LACTIC ACID, VENOUS: 0.7 mmol/L (ref 0.5–2.2)

## 2014-08-30 LAB — PROCALCITONIN: Procalcitonin: <0.1 ng/mL

## 2014-08-30 MED ORDER — LEVOTHYROXINE SODIUM 25 MCG PO TABS
25.0000 ug | ORAL_TABLET | Freq: Every day | ORAL | Status: DC
  Administered 2014-08-31 – 2014-09-03 (×4): 25 ug via ORAL
  Filled 2014-08-30 (×4): qty 1

## 2014-08-30 MED ORDER — OXYCODONE HCL 15 MG PO TABS: 15.0000 mg | ORAL_TABLET | Freq: Four times a day (QID) | ORAL | Status: DC | PRN

## 2014-08-30 MED ORDER — CLOPIDOGREL BISULFATE 75 MG PO TABS
75.0000 mg | ORAL_TABLET | Freq: Every day | ORAL | Status: DC
  Administered 2014-08-31 – 2014-09-03 (×4): 75 mg via ORAL
  Filled 2014-08-30 (×4): qty 1

## 2014-08-30 MED ORDER — ESCITALOPRAM OXALATE 10 MG PO TABS
10.0000 mg | ORAL_TABLET | Freq: Every day | ORAL | Status: DC
  Administered 2014-08-31 – 2014-09-03 (×4): 10 mg via ORAL
  Filled 2014-08-30 (×4): qty 1

## 2014-08-30 MED ORDER — VANCOMYCIN HCL IN DEXTROSE 1-5 GM/200ML-% IV SOLN
1000.0000 mg | Freq: Once | INTRAVENOUS | Status: AC
Start: 2014-08-30 — End: 2014-08-30
  Administered 2014-08-30: 1000 mg via INTRAVENOUS
  Filled 2014-08-30: qty 200

## 2014-08-30 MED ORDER — ACETAMINOPHEN 650 MG RE SUPP
650.0000 mg | Freq: Once | RECTAL | Status: AC
  Administered 2014-08-30: 650 mg via RECTAL

## 2014-08-30 MED ORDER — PREGABALIN 75 MG PO CAPS: 75.0000 mg | ORAL_CAPSULE | Freq: Three times a day (TID) | ORAL | Status: DC

## 2014-08-30 MED ORDER — ALPRAZOLAM 1 MG PO TABS: 1.0000 mg | ORAL_TABLET | Freq: Every day | ORAL | Status: DC | PRN

## 2014-08-30 MED ORDER — KETOROLAC TROMETHAMINE 15 MG/ML IJ SOLN
15.0000 mg | Freq: Four times a day (QID) | INTRAMUSCULAR | Status: DC | PRN
  Administered 2014-08-30 – 2014-08-31 (×2): 15 mg via INTRAVENOUS
  Filled 2014-08-30 (×2): qty 1

## 2014-08-30 MED ORDER — VANCOMYCIN HCL IN DEXTROSE 1-5 GM/200ML-% IV SOLN
1000.0000 mg | Freq: Two times a day (BID) | INTRAVENOUS | Status: DC
Start: 2014-08-31 — End: 2014-09-01
  Administered 2014-08-31 – 2014-09-01 (×3): 1000 mg via INTRAVENOUS
  Filled 2014-08-30 (×6): qty 200

## 2014-08-30 MED ORDER — MEMANTINE HCL 10 MG PO TABS
5.0000 mg | ORAL_TABLET | Freq: Every day | ORAL | Status: DC
  Administered 2014-08-31 – 2014-09-03 (×4): 5 mg via ORAL
  Filled 2014-08-30 (×4): qty 1

## 2014-08-30 MED ORDER — CHLORHEXIDINE GLUCONATE 0.12 % MT SOLN
15.0000 mL | Freq: Two times a day (BID) | OROMUCOSAL | Status: DC
  Administered 2014-08-30 – 2014-09-03 (×7): 15 mL via OROMUCOSAL
  Filled 2014-08-30 (×7): qty 15

## 2014-08-30 MED ORDER — ACETAMINOPHEN 325 MG RE SUPP
RECTAL | Status: AC
  Administered 2014-08-30: 650 mg via RECTAL
  Filled 2014-08-30: qty 2

## 2014-08-30 MED ORDER — ASPIRIN EC 81 MG PO TBEC
81.0000 mg | DELAYED_RELEASE_TABLET | Freq: Every day | ORAL | Status: DC
  Administered 2014-08-31 – 2014-09-02 (×3): 81 mg via ORAL
  Filled 2014-08-30 (×4): qty 1

## 2014-08-30 MED ORDER — FENTANYL 50 MCG/HR TD PT72
50.0000 ug | MEDICATED_PATCH | TRANSDERMAL | Status: DC
  Administered 2014-08-30: 50 ug via TRANSDERMAL
  Filled 2014-08-30: qty 1

## 2014-08-30 MED ORDER — DONEPEZIL HCL 5 MG PO TABS
10.0000 mg | ORAL_TABLET | Freq: Every day | ORAL | Status: DC
  Administered 2014-08-31 – 2014-09-02 (×3): 10 mg via ORAL
  Filled 2014-08-30 (×3): qty 2

## 2014-08-30 MED ORDER — HEPARIN SODIUM (PORCINE) 5000 UNIT/ML IJ SOLN
5000.0000 [IU] | Freq: Three times a day (TID) | INTRAMUSCULAR | Status: DC
  Administered 2014-08-30 – 2014-09-03 (×11): 5000 [IU] via SUBCUTANEOUS
  Filled 2014-08-30 (×11): qty 1

## 2014-08-30 MED ORDER — ACETAMINOPHEN 650 MG RE SUPP
650.0000 mg | RECTAL | Status: DC | PRN
  Administered 2014-08-31: 650 mg via RECTAL
  Filled 2014-08-30: qty 1

## 2014-08-30 MED ORDER — FENTANYL 100 MCG/HR TD PT72: 100.0000 ug | MEDICATED_PATCH | TRANSDERMAL | Status: DC

## 2014-08-30 MED ORDER — FUROSEMIDE 10 MG/ML IJ SOLN
20.0000 mg | Freq: Once | INTRAMUSCULAR | Status: DC
  Filled 2014-08-30: qty 2

## 2014-08-30 MED ORDER — SODIUM CHLORIDE 0.9 % IV BOLUS (SEPSIS)
1000.0000 mL | Freq: Once | INTRAVENOUS | Status: AC
  Administered 2014-08-30: 1000 mL via INTRAVENOUS

## 2014-08-30 MED ORDER — SODIUM CHLORIDE 0.9 % IV BOLUS (SEPSIS)
500.0000 mL | Freq: Once | INTRAVENOUS | Status: AC
  Administered 2014-08-30: 500 mL via INTRAVENOUS

## 2014-08-30 MED ORDER — DEXTROSE 5 % IV SOLN
1.0000 g | Freq: Three times a day (TID) | INTRAVENOUS | Status: DC
  Administered 2014-08-30 – 2014-09-02 (×8): 1 g via INTRAVENOUS
  Filled 2014-08-30 (×13): qty 1

## 2014-08-30 MED ORDER — SODIUM CHLORIDE 0.9 % IV SOLN
INTRAVENOUS | Status: DC
  Administered 2014-08-30 – 2014-09-03 (×6): via INTRAVENOUS

## 2014-08-30 MED ORDER — CETYLPYRIDINIUM CHLORIDE 0.05 % MT LIQD
7.0000 mL | Freq: Two times a day (BID) | OROMUCOSAL | Status: DC
  Administered 2014-08-30 – 2014-09-02 (×7): 7 mL via OROMUCOSAL

## 2014-08-30 MED ORDER — PIPERACILLIN-TAZOBACTAM 3.375 G IVPB 30 MIN
3.3750 g | Freq: Once | INTRAVENOUS | Status: AC
  Administered 2014-08-30: 3.375 g via INTRAVENOUS
  Filled 2014-08-30: qty 50

## 2014-08-30 NOTE — Telephone Encounter (Signed)
[facility] 

## 2014-08-30 NOTE — ED Notes (Signed)
Pt here from the [facility] for evaluation of fever and altered mental status

## 2014-08-30 NOTE — ED Notes (Signed)
Lasix held b/c pt's bp was 91/41.  Hospitalist aware.

## 2014-08-30 NOTE — ED Notes (Signed)
Neuro and respiratory assessments started with patient.  Computer in room not working.  Assessments will be charted later.

## 2014-08-30 NOTE — ED Provider Notes (Signed)
CSN: [id number]     Arrival date & time 08/30/14  1044 History  This chart was scribed for Maudry Diego, MD by Marlowe Kays, ED Scribe. This patient was seen in room APA18/APA18 and the patient's care was started at 12:08 PM   Chief Complaint  Patient presents with  . Altered Mental Status   Patient is a 80 y.o. male presenting with altered mental status. The history is provided by the patient, the EMS personnel, a caregiver and a relative. No language interpreter was used.  Altered Mental Status Presenting symptoms: lethargy and partial responsiveness   Severity:  Moderate Most recent episode:  Today Duration:  2 hours Associated symptoms: fever and weakness   Fever:    Timing:  Unable to specify   Progression:  Unable to specify Weakness:    Severity:  Severe   Onset quality:  Unable to specify   Timing:  Constant   Chronicity:  New  LEVEL 5 CAVEAT- Full history could not be obtained due to altered mental status.  HPI Comments:  Dustin [name] is a 80 y.o. male, brought in by EMS from the [facility], who presents to the Emergency Department needing evaluation AMS onset earlier today. [name] is present and gives the history. He states pt was admitted here three days ago for infection. [place] also reports a continued fever that started several days ago. He reports the pt is in the beginning stages of Alzheimer's. Pt states he is hurting all over.  Past Medical History  Diagnosis Date  . Arthritis   . Coronary atherosclerosis of native coronary artery     a. Multivessel s/p CABG 1996. b. s/p DES x 2 SVG to PDA 8/12 with distal disease managed medically.  . Mixed hyperlipidemia   . Essential hypertension, benign   . MI (myocardial infarction)   . Hematuria   . OSA (obstructive sleep apnea)   . Anxiety   . OA (osteoarthritis)   . DDD (degenerative disc disease)     Chronic back pain  . Alzheimer disease   . Enlarged prostate   . Hypothyroidism   . Atrophic  gastritis     a. By EGD 02/2013.   Past Surgical History  Procedure Laterality Date  . Coronary artery bypass graft  1996    LIMA to LAD, SVG to D2, SVG to PDA, SVG to OM1 and OM2  . Hernia repair    . Coronary angioplasty with stent placement    . Colonoscopy  08/03/2004    Jenkins-numerous large diverticula in the descending, transverse, descending, and sigmoid colon. Otherwise normal exam.  . Colonoscopy  07/12/2012    RMR: External hemorrhoidal tag; multiple rectal and colonic polyps removed and/or treated as described above. Pancolonic diverticulosis. Bx-tubular adenomas, rectal hyperplastic polyp. next colonoscopy in 06/2015.  [name] Esophagogastroduodenoscopy N/A 03/16/2013    Procedure: ESOPHAGOGASTRODUODENOSCOPY (EGD);  Surgeon: Daneil Dolin, MD;  Location: [facility];  Service: Endoscopy;  Laterality: N/A;  12:00-moved to [place] notified pt  . Esophagogastroduodenoscopy N/A 03/06/2013    Procedure: ESOPHAGOGASTRODUODENOSCOPY (EGD);  Surgeon: Daneil Dolin, MD;  Location: [facility];  Service: Endoscopy;  Laterality: N/A;   Family History  Problem Relation Age of Onset  . Heart disease    . Colon cancer Neg Hx    History  Substance Use Topics  . Smoking status: Former Smoker -- 2.00 packs/day for 40 years    Types: Cigarettes    Quit date: 12/27/1994  .  Smokeless tobacco: Never Used  . Alcohol Use: No    Review of Systems  Constitutional: Positive for fever.  Neurological: Positive for weakness.   LEVEL 5 CAVEAT- Full history could not be obtained due to altered mental status.  Allergies  Review of patient's allergies indicates no known allergies.  Home Medications   Prior to Admission medications   Medication Sig Start Date End Date Taking? Authorizing Provider  ALPRAZolam Duanne Moron) 1 MG tablet Take 1 tablet (1 mg total) by mouth daily as needed. For anxiety 08/30/14  Yes Tiffany L Reed, DO  amLODipine (NORVASC) 10 MG tablet Take 10 mg by mouth daily.   Yes  Historical Provider, MD  aspirin EC 81 MG tablet Take 1 tablet (81 mg total) by mouth daily. 06/29/14  Yes Ripudeep Krystal Eaton, MD  carvedilol (COREG) 3.125 MG tablet Take 1 tablet by mouth 2 (two) times daily. 08/06/14  Yes Historical Provider, MD  clopidogrel (PLAVIX) 75 MG tablet Take 75 mg by mouth daily.    Yes Historical Provider, MD  cyclobenzaprine (FLEXERIL) 5 MG tablet Take 1 tablet (5 mg total) by mouth 2 (two) times daily as needed for muscle spasms. 08/29/14  Yes Lezlie Octave Black, NP  donepezil (ARICEPT) 10 MG tablet Take 1 tablet by mouth At bedtime. 07/01/12  Yes Historical Provider, MD  escitalopram (LEXAPRO) 10 MG tablet Take 10 mg by mouth daily.   Yes Historical Provider, MD  fentaNYL (DURAGESIC - DOSED MCG/HR) 100 MCG/HR Place 1 patch (100 mcg total) onto the skin every 3 (three) days. 08/31/14  Yes Tiffany L Reed, DO  gabapentin (NEURONTIN) 100 MG capsule Take 100 mg by mouth daily.   Yes Historical Provider, MD  hydrochlorothiazide (,MICROZIDE/HYDRODIURIL,) 12.5 MG capsule Take 12.5 mg by mouth daily.  07/12/11  Yes Historical Provider, MD  levothyroxine (SYNTHROID, LEVOTHROID) 25 MCG tablet Take 25 mcg by mouth daily.  06/19/12  Yes Historical Provider, MD  NAMENDA 5 MG tablet Take 5 mg by mouth daily.  07/23/11  Yes Historical Provider, MD  nitroGLYCERIN (NITROSTAT) 0.4 MG SL tablet Place 0.4 mg under the tongue every 5 (five) minutes as needed. For chest pains 08/24/13  Yes Satira Sark, MD  oxyCODONE (ROXICODONE) 15 MG immediate release tablet Take 1 tablet (15 mg total) by mouth every 6 (six) hours as needed for pain. 08/30/14  Yes Tiffany L Reed, DO  pravastatin (PRAVACHOL) 40 MG tablet TAKE (1) TABLET BY MOUTH AT BEDTIME. 12/26/13  Yes Satira Sark, MD  pregabalin (LYRICA) 75 MG capsule Take 1 capsule (75 mg total) by mouth 3 (three) times daily. 08/30/14  Yes Tiffany L Reed, DO  traZODone (DESYREL) 50 MG tablet Take 50 mg by mouth at bedtime.  06/06/14  Yes Historical Provider, MD    Triage Vitals: BP 102/40  Pulse 55  Temp(Src) 97.7 F (36.5 C) (Axillary)  Resp 12  SpO2 93% Physical Exam  Nursing note and vitals reviewed. Constitutional: He appears well-developed. He appears lethargic.  Profound weakness all over.  HENT:  Head: Normocephalic.  Eyes: Conjunctivae and EOM are normal. No scleral icterus.  Neck: Neck supple. No thyromegaly present.  Neck tender.  Cardiovascular: Normal rate and regular rhythm.  Exam reveals no gallop and no friction rub.   No murmur heard. Pulmonary/Chest: No stridor. He has no wheezes. He has no rales. He exhibits no tenderness.  Significant crackles bilaterally.   Abdominal: He exhibits no distension. There is no tenderness. There is no rebound.  Musculoskeletal: Normal  range of motion. He exhibits tenderness. He exhibits no edema.  Tenderness to all extremities.  Lymphadenopathy:    He has no cervical adenopathy.  Neurological: He appears lethargic. He exhibits normal muscle tone. Coordination normal.  Oriented to person.  Skin: No rash noted. No erythema.  Psychiatric: He has a normal mood and affect. His behavior is normal.    ED Course  Procedures (including critical care time) DIAGNOSTIC STUDIES: Oxygen Saturation is 93% on 4 L/ [place], low by my interpretation.   COORDINATION OF CARE: 12:13 PM- Will admit for possible pneumonia. Pt verbalizes understanding and agrees to plan.  Medications  acetaminophen (TYLENOL) 325 MG suppository (not administered)  sodium chloride 0.9 % bolus 1,000 mL (1,000 mLs Intravenous New Bag/Given 08/30/14 1207)    Labs Review Labs Reviewed  CBC WITH DIFFERENTIAL - Abnormal; Notable for the following:    RBC 3.81 (*)    Hemoglobin 12.3 (*)    HCT 37.2 (*)    All other components within normal limits  COMPREHENSIVE METABOLIC PANEL - Abnormal; Notable for the following:    CO2 35 (*)    Glucose, Bld 143 (*)    Albumin 3.3 (*)    GFR calc non Af Amer 82 (*)    All other components  within normal limits  LACTIC ACID, PLASMA  TROPONIN I  URINALYSIS, ROUTINE W REFLEX MICROSCOPIC    Imaging Review Dg Chest 1 View  08/30/2014   CLINICAL DATA:  Cough, congestion.  EXAM: CHEST - 1 VIEW  COMPARISON:  08/28/2014  FINDINGS: Prior CABG. Low lung volumes. Cardiomegaly with vascular congestion. No confluent opacities or effusions. No acute bony abnormality.  IMPRESSION: Cardiomegaly with mild vascular congestion.  Low lung volumes.   Electronically Signed   By: Rolm Baptise M.D.   On: 08/30/2014 11:38   Ct Head Wo Contrast  08/30/2014   CLINICAL DATA:  Altered mental status  EXAM: CT HEAD WITHOUT CONTRAST  TECHNIQUE: Contiguous axial images were obtained from the base of the skull through the vertex without intravenous contrast.  COMPARISON:  Noncontrast CT scan of the brain of August 28, 2014  FINDINGS: There is mild diffuse cerebral and cerebellar atrophy. There is no intracranial hemorrhage nor intracranial mass effect. No acute ischemic changes are demonstrated. There is punctate basal ganglia calcification on the left. An old subcentimeter lacunar infarction in the right basal ganglia is present. The cerebellum and brainstem are normal.  The observed paranasal sinuses exhibit no air-fluid levels. There is mucoperiosteal thickening bilaterally in the ethmoid sinuses as well as and a right sphenoid sinus cell. This is slightly more conspicuous than on the previous study. There is no acute or healing skull fracture. There is no cephalohematoma.  IMPRESSION: 1. There no evidence of an acute intracranial hemorrhage nor of an acute ischemic event. 2. There mild age related atrophic changes. 3. There are mild inflammatory change of the ethmoid and right sphenoid sinus cells.   Electronically Signed   By: David  Martinique   On: 08/30/2014 11:45   Ct Head Wo Contrast  08/28/2014   CLINICAL DATA:  Left-sided neck pain and headaches  EXAM: CT HEAD WITHOUT CONTRAST  CT CERVICAL SPINE WITHOUT CONTRAST   TECHNIQUE: Multidetector CT imaging of the head and cervical spine was performed following the standard protocol without intravenous contrast. Multiplanar CT image reconstructions of the cervical spine were also generated.  COMPARISON:  06/27/2014  FINDINGS: CT HEAD FINDINGS  The bony calvarium is intact. Mild atrophic changes are  noted. No findings to suggest acute hemorrhage, acute infarction or space-occupying mass lesion are noted.  CT CERVICAL SPINE FINDINGS  Seven cervical segments are well visualized. Mild osteophytic changes are noted. No acute fracture or acute facet abnormality is noted. The surrounding soft tissues are unremarkable. Mild facet hypertrophic changes are noted.  IMPRESSION: CT head:  Atrophic changes without acute abnormality.  CT of the cervical spine: Degenerative change without acute abnormality.   Electronically Signed   By: Inez Catalina M.D.   On: 08/28/2014 20:40   Ct Angio Neck W/cm &/or Wo/cm  08/29/2014   CLINICAL DATA:  NECK PAIN  EXAM: CT ANGIOGRAPHY NECK  TECHNIQUE: Multidetector CT imaging of the neck was performed using the standard protocol during bolus administration of intravenous contrast. Multiplanar CT image reconstructions and MIPs were obtained to evaluate the vascular anatomy. Carotid stenosis measurements (when applicable) are obtained utilizing NASCET criteria, using the distal internal carotid diameter as the denominator.  CONTRAST:  46mL OMNIPAQUE IOHEXOL 350 MG/ML SOLN  COMPARISON:  Prior CT cervical spine from 08/28/2014  FINDINGS: The visualized aortic arch is of normal caliber with normal 3 vessel morphology. Scattered calcified atheromatous disease present within the aortic arch. No high-grade stenosis seen at the origin of the great vessels. Subclavian arteries are well opacified.  The common carotid arteries are symmetric in caliber without occlusion, dissection, or hemodynamically significant stenosis. Prominent calcified atheromatous disease present  about the carotid bifurcations bilaterally.  Prominent calcified plaque with associated short segment stenosis of approximately 30-40% seen within the proximal right internal carotid artery (series 8, image 81). The right internal carotid artery is well opacified distally without evidence of dissection or other hemodynamically significant stenosis.  Calcified and noncalcified plaque with resultant relatively smooth stenosis of approximately 30-40% seen within the proximal left internal carotid artery (series 8, image 87). The left internal carotid arteries otherwise well opacified without evidence of dissection or other acute abnormality.  Both vertebral arteries arise from the subclavian arteries. Vascular calcifications seen at the origin of the vertebral arteries bilaterally. The right vertebral artery is dominant. No evidence of vertebral artery dissection or occlusion. No hemodynamically significant stenosis seen within the vertebral arteries bilaterally.  Visualized superior mediastinum within normal limits. Atelectatic changes noted within the visualized lungs. Thyroid gland within normal limits. No adenopathy within the neck.  No acute osseous abnormality. No worrisome lytic or blastic osseous lesions.  IMPRESSION: 1. No dissection or other acute abnormality identified within the major arterial vasculature of the neck. 2. Atheromatous disease within the proximal internal carotid arteries with associated stenoses of approximately 30-40% by NASCET criteria. No high-grade hemodynamically significant stenosis identified.   Electronically Signed   By: Jeannine Boga M.D.   On: 08/29/2014 03:35   Ct Cervical Spine Wo Contrast  08/28/2014   CLINICAL DATA:  Left-sided neck pain and headaches  EXAM: CT HEAD WITHOUT CONTRAST  CT CERVICAL SPINE WITHOUT CONTRAST  TECHNIQUE: Multidetector CT imaging of the head and cervical spine was performed following the standard protocol without intravenous contrast.  Multiplanar CT image reconstructions of the cervical spine were also generated.  COMPARISON:  06/27/2014  FINDINGS: CT HEAD FINDINGS  The bony calvarium is intact. Mild atrophic changes are noted. No findings to suggest acute hemorrhage, acute infarction or space-occupying mass lesion are noted.  CT CERVICAL SPINE FINDINGS  Seven cervical segments are well visualized. Mild osteophytic changes are noted. No acute fracture or acute facet abnormality is noted. The surrounding soft tissues are unremarkable. Mild  facet hypertrophic changes are noted.  IMPRESSION: CT head:  Atrophic changes without acute abnormality.  CT of the cervical spine: Degenerative change without acute abnormality.   Electronically Signed   By: Inez Catalina M.D.   On: 08/28/2014 20:40   Dg Chest Portable 1 View  08/28/2014   CLINICAL DATA:  NECK PAIN NECK PAIN  EXAM: PORTABLE CHEST - 1 VIEW  COMPARISON:  06/27/2014  FINDINGS: Previous CABG. Heart size upper limits normal. Mild diffuse interstitial edema or infiltrates, new increased since previous exam. No effusion.  IMPRESSION: 1. Mild bilateral interstitial edema or infiltrates, new since previous exam.   Electronically Signed   By: Arne Cleveland M.D.   On: 08/28/2014 23:39     EKG Interpretation None      MDM   Final diagnoses:  None     I personally performed the services described in this documentation, which was scribed in my presence. The recorded information has been reviewed and is accurate.    Maudry Diego, MD 08/30/14 [phone]

## 2014-08-30 NOTE — Progress Notes (Signed)
[place] Progress Note Patient Name: Dustin [name] DOB: 10/06/1934 MRN: [id number]   Date of Service  08/30/2014  HPI/Events of Note  New patient arrival from ED Multipl comorbid illnesses including esophageal stricture, demtntia, OSA and CHF who was admitted on 9/4 from a SNF with hypoxemic and hypercapnic respiratory failure.  Likely sepsis  eICU Interventions  Case discussed with Dr. Verlon Au through [place]. Plan to start BIPAP, repeat ABG, empiric antibiotics, close monitoring in ICU        MCQUAID, DOUGLAS 08/30/2014, 4:01 PM

## 2014-08-30 NOTE — Progress Notes (Addendum)
PT resting with out problems, Decreasing BiPAP pressures some. PT has OSA, but this looks more CHF than sepsis. Fluid may be Biggest problem. PT also getting chronic pain medicine. As long as he is sedated he is no problem on BiPAP. Some dementia.

## 2014-08-30 NOTE — ED Notes (Addendum)
Pt receiving bolus at this time. Hopsitalist aware of recent vs

## 2014-08-30 NOTE — Progress Notes (Signed)
ANTIBIOTIC CONSULT NOTE - INITIAL  Pharmacy Consult for Vancomycin & Cefepime Indication: pneumonia  No Known Allergies  Patient Measurements: Height: 6' (182.9 cm) Weight: 194 lb 10.7 oz (88.3 kg) IBW/kg (Calculated) : 77.6  Vital Signs: Temp: 98.1 F (36.7 C) (09/04 1435) Temp src: Oral (09/04 1435) BP: 92/45 mmHg (09/04 1445) Pulse Rate: 48 (09/04 1445) Intake/Output from previous day:   Intake/Output from this shift: Total I/O In: 900 [Other:700; IV Piggyback:200] Out: -   Labs:  Recent Labs  08/28/14 2112 08/30/14 1107  WBC 9.9 9.2  HGB 13.7 12.3*  PLT 208 183  CREATININE 0.70 0.86   Estimated Creatinine Clearance: 79 ml/min (by C-G formula based on Cr of 0.86). No results found for this basename: VANCOTROUGH, Corlis Leak, VANCORANDOM, [place], GENTPEAK, GENTRANDOM, TOBRATROUGH, TOBRAPEAK, TOBRARND, AMIKACINPEAK, AMIKACINTROU, AMIKACIN,  in the last 72 hours   Microbiology: Recent Results (from the past 720 hour(s))  CSF CULTURE     Status: None   Collection Time    08/28/14 11:00 PM      Result Value Ref Range Status   Specimen Description CSF   Final   Special Requests NONE   Final   Gram Stain     Final   Value: NO WBC SEEN     NO ORGANISMS SEEN     CYTOSPIN SLIDE Performed at [facility]     Performed at [facility]   Culture     Final   Value: NO GROWTH 1 DAY     Performed at [facility]   Report Status PENDING   Incomplete    Medical History: Past Medical History  Diagnosis Date  . Arthritis   . Coronary atherosclerosis of native coronary artery     a. Multivessel s/p CABG 1996. b. s/p DES x 2 SVG to PDA 8/12 with distal disease managed medically.  . Mixed hyperlipidemia   . Essential hypertension, benign   . MI (myocardial infarction)   . Hematuria   . OSA (obstructive sleep apnea)   . Anxiety   . OA (osteoarthritis)   . DDD (degenerative disc disease)     Chronic back pain  . Alzheimer disease   .  Enlarged prostate   . Hypothyroidism   . Atrophic gastritis     a. By EGD 02/2013.    Medications:  Scheduled:  . antiseptic oral rinse  7 mL Mouth Rinse q12n4p  . aspirin EC  81 mg Oral Daily  . ceFEPime (MAXIPIME) IV  1 g Intravenous Q8H  . chlorhexidine  15 mL Mouth Rinse BID  . [START ON 08/31/2014] clopidogrel  75 mg Oral Daily  . donepezil  10 mg Oral QHS  . [START ON 08/31/2014] escitalopram  10 mg Oral Daily  . fentaNYL  50 mcg Transdermal Q72H  . heparin  5,000 Units Subcutaneous 3 times per day  . [START ON 08/31/2014] levothyroxine  25 mcg Oral QAC breakfast  . [START ON 08/31/2014] memantine  5 mg Oral Daily  . [START ON 08/31/2014] vancomycin  1,000 mg Intravenous Q12H   Assessment: 80 yo M discharged to [facility] 9/3 after brief admission for neck pain.  He returned to ED today with altered mental status & fever 101.5 F.   WBC & lactic acid levels are normal.  CXR 9/3 with possible infiltrates.  Renal function is at patient's baseline.   Vancomycin 9/4>> Cefepime 9/4>>  Goal of Therapy:  Vancomycin trough level 15-20 mcg/ml  Plan:  Continue Cefepime 1gm  IV q8h Vancomycin 1g IV q12h Check Vancomycin trough at steady state Monitor renal function and cx data  *Consider change Cefepime to Zosyn for anaerobic coverage if concern for aspiration PNA  Christopherjame Carnell, Lavonia Drafts 08/30/2014,2:59 PM

## 2014-08-30 NOTE — H&P (Signed)
[name] History and Physical  RAFFAELE DERISE HUT:[id number] DOB: [birth year] DOA: 08/30/2014  Referring physician: ED PCP: Glo Herring., MD  Specialists: None  Chief Complaint: Fever  HPI:  80 y/o ? h/o grade 1 diastolic heart failure, prior chest pain-Stent in 2009-history 2012-stress test 06/29/14 EF 40-no reversible ischemia, left-sided weakness secondary to TIA, hyperlipidemia, obstructive sleep apnea not on CPAP secondary to noncompliance with bipap, hypertension, Possible alzheimer/Mulit-infarct dementia,  D.c 08/29/14 to NH, prior Esophagus stricture 02/2013/chronic constipation. He was admitted with some neck pain and discomfort 08/29/14 and d/c to SNF as he was somewhat debilitated.  Review of chart reveals that his chest x-ray on admission 9/3 showed questionable infiltrate or fluid but he had no fevers no chills and because of his presentation with altered mental status and appendectomy was performed which was negative he continued to be afebrile during hospital stay and was discharged in a stable state to the nursing facility 9/3/1 it was felt that he would benefit from Flexeril as he had significant neck spasm and pain in the upper cervical region although no findings were noted on CT scan other than some mild osteophytes and degenerative disc disease in the neck 5- He represented [facility] with a temperature of 103. Emergency room workup revealed bicarbonate 35, BX of creatinine 20/2.86, troponin point-of-care less than 0.32, lactic acid 2.7, proBNP 1378,  Hemoglobin 12.3 hematocrit 37.2, WBC 9.2 CT of the head was performed once again 08/30/14 showing no acute changes or infarct Chest x-ray showed possible infiltrates    Review of Systems: He is unable to give a meaningful history although he told the nurse that he was at the hospital when he first came   Past Medical History  Diagnosis Date  . Arthritis   . Coronary atherosclerosis of native coronary artery      a. Multivessel s/p CABG 1996. b. s/p DES x 2 SVG to PDA 8/12 with distal disease managed medically.  . Mixed hyperlipidemia   . Essential hypertension, benign   . MI (myocardial infarction)   . Hematuria   . OSA (obstructive sleep apnea)   . Anxiety   . OA (osteoarthritis)   . DDD (degenerative disc disease)     Chronic back pain  . Alzheimer disease   . Enlarged prostate   . Hypothyroidism   . Atrophic gastritis     a. By EGD 02/2013.   Past Surgical History  Procedure Laterality Date  . Coronary artery bypass graft  1996    LIMA to LAD, SVG to D2, SVG to PDA, SVG to OM1 and OM2  . Hernia repair    . Coronary angioplasty with stent placement    . Colonoscopy  08/03/2004    Jenkins-numerous large diverticula in the descending, transverse, descending, and sigmoid colon. Otherwise normal exam.  . Colonoscopy  07/12/2012    RMR: External hemorrhoidal tag; multiple rectal and colonic polyps removed and/or treated as described above. Pancolonic diverticulosis. Bx-tubular adenomas, rectal hyperplastic polyp. next colonoscopy in 06/2015.  [name] Esophagogastroduodenoscopy N/A 03/16/2013    Procedure: ESOPHAGOGASTRODUODENOSCOPY (EGD);  Surgeon: Daneil Dolin, MD;  Location: [facility];  Service: Endoscopy;  Laterality: N/A;  12:00-moved to [place] notified pt  . Esophagogastroduodenoscopy N/A 03/06/2013    Procedure: ESOPHAGOGASTRODUODENOSCOPY (EGD);  Surgeon: Daneil Dolin, MD;  Location: [facility];  Service: Endoscopy;  Laterality: N/A;   Social History:  History   Social History Narrative   Lives w/ ailing wife.  Son brings meals 3x per week    No Known Allergies  Family History  Problem Relation Age of Onset  . Heart disease    . Colon cancer Neg Hx     Prior to Admission medications   Medication Sig Start Date End Date Taking? Authorizing Provider  ALPRAZolam Duanne Moron) 1 MG tablet Take 1 tablet (1 mg total) by mouth daily as needed. For anxiety 08/30/14  Yes Tiffany L  Reed, DO  amLODipine (NORVASC) 10 MG tablet Take 10 mg by mouth daily.   Yes Historical Provider, MD  aspirin EC 81 MG tablet Take 1 tablet (81 mg total) by mouth daily. 06/29/14  Yes Ripudeep Krystal Eaton, MD  carvedilol (COREG) 3.125 MG tablet Take 1 tablet by mouth 2 (two) times daily. 08/06/14  Yes Historical Provider, MD  clopidogrel (PLAVIX) 75 MG tablet Take 75 mg by mouth daily.    Yes Historical Provider, MD  cyclobenzaprine (FLEXERIL) 5 MG tablet Take 1 tablet (5 mg total) by mouth 2 (two) times daily as needed for muscle spasms. 08/29/14  Yes Lezlie Octave Black, NP  donepezil (ARICEPT) 10 MG tablet Take 1 tablet by mouth At bedtime. 07/01/12  Yes Historical Provider, MD  escitalopram (LEXAPRO) 10 MG tablet Take 10 mg by mouth daily.   Yes Historical Provider, MD  fentaNYL (DURAGESIC - DOSED MCG/HR) 100 MCG/HR Place 1 patch (100 mcg total) onto the skin every 3 (three) days. 08/31/14  Yes Tiffany L Reed, DO  gabapentin (NEURONTIN) 100 MG capsule Take 100 mg by mouth daily.   Yes Historical Provider, MD  hydrochlorothiazide (,MICROZIDE/HYDRODIURIL,) 12.5 MG capsule Take 12.5 mg by mouth daily.  07/12/11  Yes Historical Provider, MD  levothyroxine (SYNTHROID, LEVOTHROID) 25 MCG tablet Take 25 mcg by mouth daily.  06/19/12  Yes Historical Provider, MD  NAMENDA 5 MG tablet Take 5 mg by mouth daily.  07/23/11  Yes Historical Provider, MD  nitroGLYCERIN (NITROSTAT) 0.4 MG SL tablet Place 0.4 mg under the tongue every 5 (five) minutes as needed. For chest pains 08/24/13  Yes Satira Sark, MD  oxyCODONE (ROXICODONE) 15 MG immediate release tablet Take 1 tablet (15 mg total) by mouth every 6 (six) hours as needed for pain. 08/30/14  Yes Tiffany L Reed, DO  pravastatin (PRAVACHOL) 40 MG tablet TAKE (1) TABLET BY MOUTH AT BEDTIME. 12/26/13  Yes Satira Sark, MD  pregabalin (LYRICA) 75 MG capsule Take 1 capsule (75 mg total) by mouth 3 (three) times daily. 08/30/14  Yes Tiffany L Reed, DO  traZODone (DESYREL) 50 MG  tablet Take 50 mg by mouth at bedtime.  06/06/14  Yes Historical Provider, MD   Physical Exam: Filed Vitals:   08/30/14 1057 08/30/14 1145  BP: 102/40   Pulse: 55   Temp: 97.7 F (36.5 C) 101.5 F (38.6 C)  TempSrc: Axillary Rectal  Resp: 12   SpO2: 93%      General:  Alert and not oriented seems a little sleepy and lethargic  Eyes:  No pallor no icterus pupils are reactive bilaterally  ENT:  Some facial droop on the right side compared to left smile is no symmetric  Neck:  Soft nontender supple  Cardiovascular:  S1-S2 no murmur rub or gallop  Respiratory:  Clear to auscultation but limited exam as patient not able to assist me with sitting up  Abdomen:  Soft nontender nondistended no rebound  Skin:  No lower extremity edema  Musculoskeletal:  Range of motion intact  Psychiatric:  Euthymic  Neurologic:  Grossly intact  Labs on Admission:  Basic Metabolic Panel:  Recent Labs Lab 08/28/14 2112 08/30/14 1107  NA 139 139  K 4.3 4.3  CL 96 97  CO2 33* 35*  GLUCOSE 114* 143*  BUN 9 20  CREATININE 0.70 0.86  CALCIUM 9.4 8.9   Liver Function Tests:  Recent Labs Lab 08/28/14 2112 08/30/14 1107  AST 21 14  ALT 14 11  ALKPHOS 62 45  BILITOT 1.1 1.2  PROT 7.7 6.7  ALBUMIN 4.1 3.3*   No results found for this basename: LIPASE, AMYLASE,  in the last 168 hours No results found for this basename: AMMONIA,  in the last 168 hours CBC:  Recent Labs Lab 08/28/14 2112 08/30/14 1107  WBC 9.9 9.2  NEUTROABS 7.8* 6.9  HGB 13.7 12.3*  HCT 40.7 37.2*  MCV 97.1 97.6  PLT 208 183   Cardiac Enzymes:  Recent Labs Lab 08/30/14 1107  TROPONINI <0.30    BNP (last 3 results)  Recent Labs  08/30/14 1200  PROBNP 1378.0*   CBG: No results found for this basename: GLUCAP,  in the last 168 hours  Radiological Exams on Admission: Dg Chest 1 View  08/30/2014   CLINICAL DATA:  Cough, congestion.  EXAM: CHEST - 1 VIEW  COMPARISON:  08/28/2014  FINDINGS: Prior  CABG. Low lung volumes. Cardiomegaly with vascular congestion. No confluent opacities or effusions. No acute bony abnormality.  IMPRESSION: Cardiomegaly with mild vascular congestion.  Low lung volumes.   Electronically Signed   By: Rolm Baptise M.D.   On: 08/30/2014 11:38   Ct Head Wo Contrast  08/30/2014   CLINICAL DATA:  Altered mental status  EXAM: CT HEAD WITHOUT CONTRAST  TECHNIQUE: Contiguous axial images were obtained from the base of the skull through the vertex without intravenous contrast.  COMPARISON:  Noncontrast CT scan of the brain of August 28, 2014  FINDINGS: There is mild diffuse cerebral and cerebellar atrophy. There is no intracranial hemorrhage nor intracranial mass effect. No acute ischemic changes are demonstrated. There is punctate basal ganglia calcification on the left. An old subcentimeter lacunar infarction in the right basal ganglia is present. The cerebellum and brainstem are normal.  The observed paranasal sinuses exhibit no air-fluid levels. There is mucoperiosteal thickening bilaterally in the ethmoid sinuses as well as and a right sphenoid sinus cell. This is slightly more conspicuous than on the previous study. There is no acute or healing skull fracture. There is no cephalohematoma.  IMPRESSION: 1. There no evidence of an acute intracranial hemorrhage nor of an acute ischemic event. 2. There mild age related atrophic changes. 3. There are mild inflammatory change of the ethmoid and right sphenoid sinus cells.   Electronically Signed   By: David  Martinique   On: 08/30/2014 11:45   Ct Head Wo Contrast  08/28/2014   CLINICAL DATA:  Left-sided neck pain and headaches  EXAM: CT HEAD WITHOUT CONTRAST  CT CERVICAL SPINE WITHOUT CONTRAST  TECHNIQUE: Multidetector CT imaging of the head and cervical spine was performed following the standard protocol without intravenous contrast. Multiplanar CT image reconstructions of the cervical spine were also generated.  COMPARISON:  06/27/2014   FINDINGS: CT HEAD FINDINGS  The bony calvarium is intact. Mild atrophic changes are noted. No findings to suggest acute hemorrhage, acute infarction or space-occupying mass lesion are noted.  CT CERVICAL SPINE FINDINGS  Seven cervical segments are well visualized. Mild osteophytic changes are noted. No acute fracture or acute  facet abnormality is noted. The surrounding soft tissues are unremarkable. Mild facet hypertrophic changes are noted.  IMPRESSION: CT head:  Atrophic changes without acute abnormality.  CT of the cervical spine: Degenerative change without acute abnormality.   Electronically Signed   By: Inez Catalina M.D.   On: 08/28/2014 20:40   Ct Angio Neck W/cm &/or Wo/cm  08/29/2014   CLINICAL DATA:  NECK PAIN  EXAM: CT ANGIOGRAPHY NECK  TECHNIQUE: Multidetector CT imaging of the neck was performed using the standard protocol during bolus administration of intravenous contrast. Multiplanar CT image reconstructions and MIPs were obtained to evaluate the vascular anatomy. Carotid stenosis measurements (when applicable) are obtained utilizing NASCET criteria, using the distal internal carotid diameter as the denominator.  CONTRAST:  21mL OMNIPAQUE IOHEXOL 350 MG/ML SOLN  COMPARISON:  Prior CT cervical spine from 08/28/2014  FINDINGS: The visualized aortic arch is of normal caliber with normal 3 vessel morphology. Scattered calcified atheromatous disease present within the aortic arch. No high-grade stenosis seen at the origin of the great vessels. Subclavian arteries are well opacified.  The common carotid arteries are symmetric in caliber without occlusion, dissection, or hemodynamically significant stenosis. Prominent calcified atheromatous disease present about the carotid bifurcations bilaterally.  Prominent calcified plaque with associated short segment stenosis of approximately 30-40% seen within the proximal right internal carotid artery (series 8, image 81). The right internal carotid artery is well  opacified distally without evidence of dissection or other hemodynamically significant stenosis.  Calcified and noncalcified plaque with resultant relatively smooth stenosis of approximately 30-40% seen within the proximal left internal carotid artery (series 8, image 87). The left internal carotid arteries otherwise well opacified without evidence of dissection or other acute abnormality.  Both vertebral arteries arise from the subclavian arteries. Vascular calcifications seen at the origin of the vertebral arteries bilaterally. The right vertebral artery is dominant. No evidence of vertebral artery dissection or occlusion. No hemodynamically significant stenosis seen within the vertebral arteries bilaterally.  Visualized superior mediastinum within normal limits. Atelectatic changes noted within the visualized lungs. Thyroid gland within normal limits. No adenopathy within the neck.  No acute osseous abnormality. No worrisome lytic or blastic osseous lesions.  IMPRESSION: 1. No dissection or other acute abnormality identified within the major arterial vasculature of the neck. 2. Atheromatous disease within the proximal internal carotid arteries with associated stenoses of approximately 30-40% by NASCET criteria. No high-grade hemodynamically significant stenosis identified.   Electronically Signed   By: Jeannine Boga M.D.   On: 08/29/2014 03:35   Ct Cervical Spine Wo Contrast  08/28/2014   CLINICAL DATA:  Left-sided neck pain and headaches  EXAM: CT HEAD WITHOUT CONTRAST  CT CERVICAL SPINE WITHOUT CONTRAST  TECHNIQUE: Multidetector CT imaging of the head and cervical spine was performed following the standard protocol without intravenous contrast. Multiplanar CT image reconstructions of the cervical spine were also generated.  COMPARISON:  06/27/2014  FINDINGS: CT HEAD FINDINGS  The bony calvarium is intact. Mild atrophic changes are noted. No findings to suggest acute hemorrhage, acute infarction or  space-occupying mass lesion are noted.  CT CERVICAL SPINE FINDINGS  Seven cervical segments are well visualized. Mild osteophytic changes are noted. No acute fracture or acute facet abnormality is noted. The surrounding soft tissues are unremarkable. Mild facet hypertrophic changes are noted.  IMPRESSION: CT head:  Atrophic changes without acute abnormality.  CT of the cervical spine: Degenerative change without acute abnormality.   Electronically Signed   By: Linus Mako.D.  On: 08/28/2014 20:40   Dg Chest Portable 1 View  08/28/2014   CLINICAL DATA:  NECK PAIN NECK PAIN  EXAM: PORTABLE CHEST - 1 VIEW  COMPARISON:  06/27/2014  FINDINGS: Previous CABG. Heart size upper limits normal. Mild diffuse interstitial edema or infiltrates, new increased since previous exam. No effusion.  IMPRESSION: 1. Mild bilateral interstitial edema or infiltrates, new since previous exam.   Electronically Signed   By: Arne Cleveland M.D.   On: 08/28/2014 23:39    EKG: Independently reviewed.  Sinus rhythm left bundle branch\   Assessment/Plan Principal Problem:   Toxic Metabolic encephalopathy-likely combination of multiple medications, noncompliance on CPAP for OSA as well as infectious etiology. See below discussion. Will minimize all sedating culprits in terms of medications as below.     Early Sepsis c [facility] associated bacterial pneumonia-we will transition to IV cefepime and vancomycin as per pharmacy. Chest x-ray to be repeated in the morning.  Lactic acid negative however hypotensive sepsis vs. cardio reactive medications.   If there is an existing concerned subsequently the patient is an aspiration risk we will get speech therapy to see him. We will get a pro calcitonin and review him    Acute respiratory failure secondary to #2--keep on oxygen vital. Consider blood gas is sats remained below 90. Active Problems:   HYPERLIPIDEMIA-MIXED-hold nonessential meds such as Pravachol 40 for now   OBSTRUCTIVE  SLEEP APNEA-patient noncompliant on CPAP. We'll need to educate further during hospital stay benefits and risks of not being on this   HYPERTENSION, BENIGN-hypotensive on admission-Differential for hypertension could be that he is on multiple cardiac medications such as HCTZ, Coreg 3.125 twice a day, amlodipine 10 g which have been held.   Coronary atherosclerosis of native coronary artery-continue aspirin 81 mg daily   Chronic constipation-will implement bowel regimen eventually   Tubular adenoma of colon + history dysphagia sphincter and chronic constipation-stable at current time   Left-sided weakness-from prior CVA-see above discussion   Chronic pain-he is on multiple medications which will be transitionedincluding Flexeril 5 twice a day which was started on discharge,  gabapentin, oxycodone 15 every 6 when necessary, they are 75 3 times a day, gabapentin 100 daily. I've cut back his fentanyl patch from 125 mcg and if needed we can add another dose of this. He should not receive when necessary pain medications until his mentation improves       Time spent:  Bliss, [name] Pager [phone]  If 7PM-7AM, please contact night-coverage [email or web address] Password [email or web address] 08/30/2014, 12:41 PM

## 2014-08-30 NOTE — Progress Notes (Signed)
CRITICAL VALUE ALERT  Critical value received:  ABG. PH 7.18, PCO2 88.2, PO2 139, Bicarb 31.8  Date of notification:  08/30/14  Time of notification:  1914  Critical value read back:Yes.    Nurse who received alert:  Penni Homans, RN   MD notified (1st page):  Dr. Verlon Au  Time of first page:  1512  MD notified (2nd page):  Time of second page:  Responding MD:  Dr. Verlon Au  Time MD responded:  [phone]

## 2014-08-31 ENCOUNTER — Inpatient Hospital Stay (HOSPITAL_COMMUNITY): Payer: Medicare Other

## 2014-08-31 LAB — PROTIME-INR
INR: 1.23 (ref 0.00–1.49)
Prothrombin Time: 15.5 seconds — ABNORMAL HIGH (ref 11.6–15.2)

## 2014-08-31 LAB — BLOOD GAS, ARTERIAL
ACID-BASE EXCESS: 4.7 mmol/L — AB (ref 0.0–2.0)
Bicarbonate: 30.3 mEq/L — ABNORMAL HIGH (ref 20.0–24.0)
DELIVERY SYSTEMS: POSITIVE
Drawn by: 105551
Expiratory PAP: 5
FIO2: 0.6 %
Inspiratory PAP: 19
O2 Saturation: 93.9 %
PATIENT TEMPERATURE: 98.6
PCO2 ART: 58.5 mmHg — AB (ref 35.0–45.0)
RATE: 14 resp/min
TCO2: 28.1 mmol/L (ref 0–100)
pH, Arterial: 7.334 — ABNORMAL LOW (ref 7.350–7.450)
pO2, Arterial: 71.2 mmHg — ABNORMAL LOW (ref 80.0–100.0)

## 2014-08-31 LAB — CBC WITH DIFFERENTIAL/PLATELET
BASOS PCT: 0 % (ref 0–1)
Basophils Absolute: 0 10*3/uL (ref 0.0–0.1)
EOS ABS: 0 10*3/uL (ref 0.0–0.7)
Eosinophils Relative: 0 % (ref 0–5)
HEMATOCRIT: 33.3 % — AB (ref 39.0–52.0)
Hemoglobin: 11.1 g/dL — ABNORMAL LOW (ref 13.0–17.0)
Lymphocytes Relative: 5 % — ABNORMAL LOW (ref 12–46)
Lymphs Abs: 0.8 10*3/uL (ref 0.7–4.0)
MCH: 32.4 pg (ref 26.0–34.0)
MCHC: 33.3 g/dL (ref 30.0–36.0)
MCV: 97.1 fL (ref 78.0–100.0)
MONO ABS: 0.9 10*3/uL (ref 0.1–1.0)
Monocytes Relative: 6 % (ref 3–12)
Neutro Abs: 13.3 10*3/uL — ABNORMAL HIGH (ref 1.7–7.7)
Neutrophils Relative %: 89 % — ABNORMAL HIGH (ref 43–77)
Platelets: 174 10*3/uL (ref 150–400)
RBC: 3.43 MIL/uL — ABNORMAL LOW (ref 4.22–5.81)
RDW: 11.9 % (ref 11.5–15.5)
WBC: 15 10*3/uL — ABNORMAL HIGH (ref 4.0–10.5)

## 2014-08-31 LAB — COMPREHENSIVE METABOLIC PANEL
ALBUMIN: 2.6 g/dL — AB (ref 3.5–5.2)
ALT: 9 U/L (ref 0–53)
AST: 14 U/L (ref 0–37)
Alkaline Phosphatase: 33 U/L — ABNORMAL LOW (ref 39–117)
Anion gap: 8 (ref 5–15)
BILIRUBIN TOTAL: 1 mg/dL (ref 0.3–1.2)
BUN: 23 mg/dL (ref 6–23)
CHLORIDE: 100 meq/L (ref 96–112)
CO2: 32 mEq/L (ref 19–32)
Calcium: 8.1 mg/dL — ABNORMAL LOW (ref 8.4–10.5)
Creatinine, Ser: 0.81 mg/dL (ref 0.50–1.35)
GFR calc non Af Amer: 84 mL/min — ABNORMAL LOW (ref >90)
Glucose, Bld: 157 mg/dL — ABNORMAL HIGH (ref 70–99)
Potassium: 3.9 mEq/L (ref 3.7–5.3)
SODIUM: 140 meq/L (ref 137–147)
TOTAL PROTEIN: 5.6 g/dL — AB (ref 6.0–8.3)

## 2014-08-31 LAB — HIV ANTIBODY (ROUTINE TESTING W REFLEX): HIV: NONREACTIVE

## 2014-08-31 LAB — STREP PNEUMONIAE URINARY ANTIGEN: Strep Pneumo Urinary Antigen: NEGATIVE

## 2014-08-31 LAB — PROCALCITONIN: Procalcitonin: 2.45 ng/mL

## 2014-08-31 MED ORDER — SODIUM CHLORIDE 0.9 % IJ SOLN
INTRAMUSCULAR | Status: AC
  Filled 2014-08-31: qty 200

## 2014-08-31 MED ORDER — IOHEXOL 350 MG/ML SOLN
100.0000 mL | Freq: Once | INTRAVENOUS | Status: AC | PRN
  Administered 2014-08-31: 100 mL via INTRAVENOUS

## 2014-08-31 MED ORDER — SODIUM CHLORIDE 0.9 % IV BOLUS (SEPSIS)
500.0000 mL | Freq: Once | INTRAVENOUS | Status: AC
  Administered 2014-08-31: 500 mL via INTRAVENOUS

## 2014-08-31 MED ORDER — SODIUM CHLORIDE 0.9 % IJ SOLN
INTRAMUSCULAR | Status: AC
  Filled 2014-08-31: qty 21

## 2014-08-31 MED ORDER — FENTANYL 100 MCG/HR TD PT72: 100.0000 ug | MEDICATED_PATCH | TRANSDERMAL | Status: DC

## 2014-08-31 MED ORDER — FENTANYL 100 MCG/HR TD PT72
100.0000 ug | MEDICATED_PATCH | TRANSDERMAL | Status: DC
  Administered 2014-08-31 – 2014-09-03 (×2): 100 ug via TRANSDERMAL
  Filled 2014-08-31 (×2): qty 1

## 2014-08-31 MED ORDER — OXYCODONE HCL 5 MG PO TABS
5.0000 mg | ORAL_TABLET | ORAL | Status: DC | PRN
  Administered 2014-08-31 – 2014-09-03 (×11): 5 mg via ORAL
  Filled 2014-08-31 (×11): qty 1

## 2014-08-31 MED ORDER — SODIUM CHLORIDE 0.9 % IJ SOLN
INTRAMUSCULAR | Status: AC
  Filled 2014-08-31: qty 50

## 2014-08-31 MED ORDER — POLYETHYLENE GLYCOL 3350 17 G PO PACK
17.0000 g | PACK | Freq: Every day | ORAL | Status: DC
  Administered 2014-08-31 – 2014-09-02 (×3): 17 g via ORAL
  Filled 2014-08-31 (×3): qty 1

## 2014-08-31 NOTE — Progress Notes (Signed)
Dr. Verlon Au notified that patient had not had BM since August 31st. Orders given.

## 2014-08-31 NOTE — Progress Notes (Signed)
Fentanyl 50 mcg patch removed and wasted in sharps as L.Schonewitz,RN to witness.

## 2014-08-31 NOTE — Progress Notes (Addendum)
CRITICAL VALUE ALERT  Critical value received:  PCO2 58.5  Date of notification:  08/31/14  Time of notification:  0520  Critical value read back: yes  Nurse who received alert:  THassell Done RN  MD notified (1st page):  Dr. Susanne Borders  Time of first page:  0530  MD notified (2nd page):  Time of second page:  Responding MD:  Dr. Susanne Borders  Time MD responded:  0530  Also, decreased urinary output foley output 125 ml (since last output 25 ml - given first 500 ml NS bolus) and BP 95/50 (54) HR 48 - giving second 500 ml NS bolus per MD

## 2014-08-31 NOTE — Progress Notes (Signed)
Note: This document was prepared with digital dictation and possible smart phrase technology. Any transcriptional errors that result from this process are unintentional.   GEARL BARATTA GYJ:[id number] DOB: 1934-02-16 DOA: 08/30/2014 PCP: Glo Herring., MD  Brief narrative: 80 y/o ? h/o grade 1 diastolic heart failure, prior chest pain-Stent in 2009-history 2012-stress test 06/29/14 EF 40-no reversible ischemia, left-sided weakness secondary to TIA, hyperlipidemia, obstructive sleep apnea not on CPAP secondary to noncompliance with bipap, hypertension, Possible alzheimer/Mulit-infarct dementia, , prior Esophagus stricture 02/2013/chronic constipation D/C 08/29/14 to NH after being admitted for eck pain and re-admitted to [facility] with hypoxia confusion fever and possible PNA.   Past medical history-As per Problem list Chart reviewed as below- reviewed  Consultants:  Speech  Procedures:  none  Antibiotics:  Zosyn 9/4  Vanc 9/4  Cefepime 9/4   Subjective  Much more oriented and alert Doesn't recall events from past 24-48 hours In pain in the neck and in the L arm No n/v   Objective    Interim History:   Telemetry: Sinus, slightly low BP   Objective: Filed Vitals:   08/31/14 0700 08/31/14 0730 08/31/14 0800 08/31/14 0841  BP: 86/39 88/40 113/66   Pulse: 44 45 63 69  Temp:   97.5 F (36.4 C)   TempSrc:   Axillary   Resp: _0 Height:      Weight:      SpO2: 96% 98% 100% 99%    Intake/Output Summary (Last 24 hours) at 08/31/14 1035 Last data filed at 08/31/14 0539  Gross per 24 hour  Intake 2192.5 ml  Output   1750 ml  Net  442.5 ml    Exam:  General: alert no distress oriented to some extent Cardiovascular: s1 s 2no m/r/g Respiratory: clear no adde dsound Abdomen: soft Nt Nd Skin no le edema Neuro intact  Data Reviewed: Basic Metabolic Panel:  Recent Labs Lab 08/28/14 2112 08/30/14 1107 08/31/14 0522  NA 139 139 140  K 4.3 4.3 3.9    CL 96 97 100  CO2 33* 35* 32  GLUCOSE 114* 143* 157*  BUN _1 CREATININE 0.70 0.86 0.81  CALCIUM 9.4 8.9 8.1*   Liver Function Tests:  Recent Labs Lab 08/28/14 2112 08/30/14 1107 08/31/14 0522  AST _2 ALT _3 ALKPHOS 62 45 33*  BILITOT 1.1 1.2 1.0  PROT 7.7 6.7 5.6*  ALBUMIN 4.1 3.3* 2.6*   No results found for this basename: LIPASE, AMYLASE,  in the last 168 hours No results found for this basename: AMMONIA,  in the last 168 hours CBC:  Recent Labs Lab 08/28/14 2112 08/30/14 1107 08/31/14 0522  WBC 9.9 9.2 15.0*  NEUTROABS 7.8* 6.9 13.3*  HGB 13.7 12.3* 11.1*  HCT 40.7 37.2* 33.3*  MCV 97.1 97.6 97.1  PLT 208 183 174   Cardiac Enzymes:  Recent Labs Lab 08/30/14 1107  TROPONINI <0.30   BNP: No components found with this basename: POCBNP,  CBG: No results found for this basename: GLUCAP,  in the last 168 hours  Recent Results (from the past 240 hour(s))  CSF CULTURE     Status: None   Collection Time    08/28/14 11:00 PM      Result Value Ref Range Status   Specimen Description CSF   Final   Special Requests NONE   Final   Gram Stain     Final   Value: NO WBC SEEN  NO ORGANISMS SEEN     CYTOSPIN SLIDE Performed at [facility]     Performed at [facility]   Culture     Final   Value: NO GROWTH 1 DAY     Performed at [facility]   Report Status PENDING   Incomplete  CULTURE, BLOOD (ROUTINE X 2)     Status: None   Collection Time    08/30/14 12:09 PM      Result Value Ref Range Status   Specimen Description BLOOD LEFT ARM   Final   Special Requests BOTTLES DRAWN AEROBIC AND ANAEROBIC 10CC   Final   Culture NO GROWTH 1 DAY   Final   Report Status PENDING   Incomplete  MRSA PCR SCREENING     Status: None   Collection Time    08/30/14  2:30 PM      Result Value Ref Range Status   MRSA by PCR NEGATIVE  NEGATIVE Final   Comment:            The GeneXpert MRSA Assay (FDA     approved for NASAL  specimens     only), is one component of a     comprehensive MRSA colonization     surveillance program. It is not     intended to diagnose MRSA     infection nor to guide or     monitor treatment for     MRSA infections.  CULTURE, BLOOD (ROUTINE X 2)     Status: None   Collection Time    08/30/14  2:36 PM      Result Value Ref Range Status   Specimen Description BLOOD RIGHT FOREARM   Final   Special Requests BOTTLES DRAWN AEROBIC AND ANAEROBIC 6CC   Final   Culture NO GROWTH 1 DAY   Final   Report Status PENDING   Incomplete     Studies:              All Imaging reviewed and is as per above notation   Scheduled Meds: . antiseptic oral rinse  7 mL Mouth Rinse q12n4p  . aspirin EC  81 mg Oral Daily  . ceFEPime (MAXIPIME) IV  1 g Intravenous Q8H  . chlorhexidine  15 mL Mouth Rinse BID  . clopidogrel  75 mg Oral Daily  . donepezil  10 mg Oral QHS  . escitalopram  10 mg Oral Daily  . fentaNYL  50 mcg Transdermal Q72H  . heparin  5,000 Units Subcutaneous 3 times per day  . levothyroxine  25 mcg Oral QAC breakfast  . memantine  5 mg Oral Daily  . vancomycin  1,000 mg Intravenous Q12H   Continuous Infusions: . sodium chloride 50 mL/hr at 08/31/14 0100     Assessment/Plan: 1. Toxic met encephaloathy-2/2 to PNA and hypoxic resp failure as well as multiple chornic pain meds and antidpressants-had been starte don flexeril on last admission.  We will narrow polyphamracy-see#6 2. Acute Hypoxic/hypercarbic resp failure-2/2 to PNA-attempt to wean NRB.  No further need Blood gases for tight now 3. Sepsis 2/2 HCAP vs Aspiration-Fever improved.  Await Blood/sputum.  Get speech eval-broad spectrum Vanc and Cefepime.  Procalcitonin confirms sepsis-low suspicion for CHF at this time given CT scan showing PNA 08/31/14.   4. CAD c stent 2009-last stress 2012 neg-stable-Oral asa 81 daily.  Hold Coreg 3.125 for now.   5. TIA-continue ASA-ideally ASA dosage 325-confirm as OP 6. Chronic  pain-discussed with patient slow  re-implementation of pain meds-start back Fentnyl 100, lower dose Oxycodone 5 q6 prn.  D/c toradol for now.  Cont Gabapentin 100 daily.  Would d/c Lyrica on discharge home 7. Dementia-continue Namenda 5 daily, Aricept 10 qhs 8. Hypotension on admission-Coreg 3.125 bid, Amlodipine 10 daily on hold for now 9. Depression-continue Lexapro 10.  Trazadone on hold for now  Code Status: Full Family Communication: Called and LM for wife on VM Disposition Plan: inpatient SDU-transfer tele 1-2 d   Verneita Griffes, MD  [name] Pager [phone] 08/31/2014, 10:35 AM    LOS: 1 day

## 2014-09-01 ENCOUNTER — Inpatient Hospital Stay (HOSPITAL_COMMUNITY): Payer: Medicare Other

## 2014-09-01 LAB — COMPREHENSIVE METABOLIC PANEL
ALK PHOS: 34 U/L — AB (ref 39–117)
ALT: 10 U/L (ref 0–53)
AST: 17 U/L (ref 0–37)
Albumin: 2.5 g/dL — ABNORMAL LOW (ref 3.5–5.2)
Anion gap: 5 (ref 5–15)
BUN: 15 mg/dL (ref 6–23)
CALCIUM: 8.2 mg/dL — AB (ref 8.4–10.5)
CO2: 33 meq/L — AB (ref 19–32)
Chloride: 102 mEq/L (ref 96–112)
Creatinine, Ser: 0.54 mg/dL (ref 0.50–1.35)
GFR calc non Af Amer: >90 mL/min (ref >90)
GLUCOSE: 101 mg/dL — AB (ref 70–99)
Potassium: 3.6 mEq/L — ABNORMAL LOW (ref 3.7–5.3)
SODIUM: 140 meq/L (ref 137–147)
Total Bilirubin: 0.6 mg/dL (ref 0.3–1.2)
Total Protein: 5.5 g/dL — ABNORMAL LOW (ref 6.0–8.3)

## 2014-09-01 LAB — CBC WITH DIFFERENTIAL/PLATELET
Basophils Absolute: 0 10*3/uL (ref 0.0–0.1)
Basophils Relative: 0 % (ref 0–1)
EOS PCT: 2 % (ref 0–5)
Eosinophils Absolute: 0.2 10*3/uL (ref 0.0–0.7)
HEMATOCRIT: 31 % — AB (ref 39.0–52.0)
Hemoglobin: 10.2 g/dL — ABNORMAL LOW (ref 13.0–17.0)
LYMPHS PCT: 12 % (ref 12–46)
Lymphs Abs: 1.4 10*3/uL (ref 0.7–4.0)
MCH: 31.8 pg (ref 26.0–34.0)
MCHC: 32.9 g/dL (ref 30.0–36.0)
MCV: 96.6 fL (ref 78.0–100.0)
MONO ABS: 0.7 10*3/uL (ref 0.1–1.0)
MONOS PCT: 6 % (ref 3–12)
Neutro Abs: 8.8 10*3/uL — ABNORMAL HIGH (ref 1.7–7.7)
Neutrophils Relative %: 80 % — ABNORMAL HIGH (ref 43–77)
PLATELETS: 173 10*3/uL (ref 150–400)
RBC: 3.21 MIL/uL — AB (ref 4.22–5.81)
RDW: 11.9 % (ref 11.5–15.5)
WBC: 11.1 10*3/uL — ABNORMAL HIGH (ref 4.0–10.5)

## 2014-09-01 LAB — VANCOMYCIN, TROUGH: VANCOMYCIN TR: 10.2 ug/mL (ref 10.0–20.0)

## 2014-09-01 LAB — CSF CULTURE
CULTURE: NO GROWTH
GRAM STAIN: NONE SEEN

## 2014-09-01 LAB — PROCALCITONIN: Procalcitonin: 1.51 ng/mL

## 2014-09-01 LAB — CSF CULTURE W GRAM STAIN

## 2014-09-01 MED ORDER — VANCOMYCIN HCL 10 G IV SOLR
1250.0000 mg | Freq: Two times a day (BID) | INTRAVENOUS | Status: DC
  Administered 2014-09-02: 1250 mg via INTRAVENOUS
  Filled 2014-09-01 (×4): qty 1250

## 2014-09-01 MED ORDER — VANCOMYCIN HCL IN DEXTROSE 1-5 GM/200ML-% IV SOLN
1000.0000 mg | Freq: Once | INTRAVENOUS | Status: AC
  Administered 2014-09-01: 1000 mg via INTRAVENOUS
  Filled 2014-09-01: qty 200

## 2014-09-01 MED ORDER — SORBITOL 70 % SOLN
20.0000 mL | Freq: Two times a day (BID) | Status: DC
  Administered 2014-09-01 – 2014-09-02 (×4): 20 mL via ORAL
  Filled 2014-09-01 (×4): qty 30

## 2014-09-01 MED ORDER — POTASSIUM CHLORIDE CRYS ER 20 MEQ PO TBCR
40.0000 meq | EXTENDED_RELEASE_TABLET | Freq: Every day | ORAL | Status: DC
  Administered 2014-09-01 – 2014-09-03 (×3): 40 meq via ORAL
  Filled 2014-09-01 (×3): qty 2

## 2014-09-01 MED ORDER — SENNA 8.6 MG PO TABS
2.0000 | ORAL_TABLET | Freq: Every day | ORAL | Status: DC
  Administered 2014-09-01 – 2014-09-02 (×2): 17.2 mg via ORAL
  Filled 2014-09-01 (×2): qty 2

## 2014-09-01 NOTE — Progress Notes (Signed)
Report called to J. Holt,RN. Patient transferred to 308 in stable condition with RN and NT via bed.

## 2014-09-01 NOTE — Progress Notes (Signed)
Patient took sips of apple juice that was mixed with sorbitol and miralax. Explained importance of patient drinking entire glass but patient stated "I will poop when I get home".

## 2014-09-01 NOTE — Progress Notes (Signed)
Utilization review Completed Joylene Wescott RN BSN   

## 2014-09-01 NOTE — Progress Notes (Signed)
Notified MD that patient had not voided since arrival to floor @1100 . Patient attempted to void on the bedside commode and was unsuccessful. MD placed orders to In and out cath patient X3 and to bladder scan patient. Bladder scan: >236ml. Patient tolerated in and out cath well with no complaints and 417ml was drained. Bladder scanned patient after in and out cath and bladder scan showed: 24ml. Will let night shift nurse know to monitor patients output. Will continue to monitor patient at this time.

## 2014-09-01 NOTE — Progress Notes (Signed)
Notified MD that patient was coughing up some bloody sputum. MD ordered a 2 view chest x-ray for the AM and labs will be reviewed in the AM to monitor hemoglobin. Patient has no complaints at this time. Will collect sputum. Will continue to monitor patient.

## 2014-09-01 NOTE — Progress Notes (Signed)
Patient arrived to unit from ICU. Patient was alert and oriented to person but disoriented to place, situation, and time. Patients brother was at the bedside. Patients VS were within normal limits. Patients bed was at lowest level, call bell was within reach, and phone was at bedside. Will continue to monitor patient at this time.

## 2014-09-01 NOTE — Progress Notes (Signed)
Note: This document was prepared with digital dictation and possible smart phrase technology. Any transcriptional errors that result from this process are unintentional.   Dustin [name] IZT:[id number] DOB: [birth year] DOA: 08/30/2014 PCP: Glo Herring., MD  Brief narrative: 80 y/o ? h/o grade 1 diastolic heart failure, prior chest pain-Stent in 2009-history 2012-stress test 06/29/14 EF 40-no reversible ischemia, left-sided weakness secondary to TIA, hyperlipidemia, obstructive sleep apnea not on CPAP secondary to noncompliance with bipap, hypertension, Possible alzheimer/Mulit-infarct dementia, , prior Esophagus stricture 02/2013/chronic constipation D/C 08/29/14 to NH after being admitted for eck pain and re-admitted to [facility] with hypoxia confusion fever and possible PNA He was initially place don BiPAP and was slowly weaned off of this down to Nasal canula and his confusion subsequently improved-it was felt that his confusion was partially because of his pneumonia but also because of his multiple medications .   Past medical history-As per Problem list Chart reviewed as below- reviewed  Consultants:  Speech  Procedures:  none  Antibiotics:  Zosyn 9/4  Vanc 9/4  Cefepime 9/4   Subjective  Sleepy this am, but slept well Neck pain remains Tolerated diet NO stool yet.  Oxygen requirements have decreased per RN.   Objective    Interim History:   Telemetry: Sinus, slightly low BP   Objective: Filed Vitals:   09/01/14 0500 09/01/14 0600 09/01/14 0700 09/01/14 0800  BP: 97/36 97/38 100/39 101/40  Pulse: 52 53 50 51  Temp:    98.5 F (36.9 C)  TempSrc:    Axillary  Resp: _0 Height:      Weight: 90.1 kg (198 lb 10.2 oz)     SpO2: 95% 95% 91% 91%    Intake/Output Summary (Last 24 hours) at 09/01/14 0849 Last data filed at 09/01/14 0500  Gross per 24 hour  Intake   2080 ml  Output   1000 ml  Net   1080 ml    Exam:  General: alert no distress  oriented to some extent Cardiovascular: s1 s 2no m/r/g Respiratory: clear no adde dsound Abdomen: soft Nt Nd Skin no le edema Neuro intact  Data Reviewed: Basic Metabolic Panel:  Recent Labs Lab 08/28/14 2112 08/30/14 1107 08/31/14 0522 09/01/14 0517  NA 139 139 140 140  K 4.3 4.3 3.9 3.6*  CL 96 97 100 102  CO2 33* 35* 32 33*  GLUCOSE 114* 143* 157* 101*  BUN _1 CREATININE 0.70 0.86 0.81 0.54  CALCIUM 9.4 8.9 8.1* 8.2*   Liver Function Tests:  Recent Labs Lab 08/28/14 2112 08/30/14 1107 08/31/14 0522 09/01/14 0517  AST _2 ALT _3 ALKPHOS 62 45 33* 34*  BILITOT 1.1 1.2 1.0 0.6  PROT 7.7 6.7 5.6* 5.5*  ALBUMIN 4.1 3.3* 2.6* 2.5*   No results found for this basename: LIPASE, AMYLASE,  in the last 168 hours No results found for this basename: AMMONIA,  in the last 168 hours CBC:  Recent Labs Lab 08/28/14 2112 08/30/14 1107 08/31/14 0522 09/01/14 0517  WBC 9.9 9.2 15.0* 11.1*  NEUTROABS 7.8* 6.9 13.3* 8.8*  HGB 13.7 12.3* 11.1* 10.2*  HCT 40.7 37.2* 33.3* 31.0*  MCV 97.1 97.6 97.1 96.6  PLT 208 183 174 173   Cardiac Enzymes:  Recent Labs Lab 08/30/14 1107  TROPONINI <0.30   BNP: No components found with this basename: POCBNP,  CBG: No results found for this basename: GLUCAP,  in the  last 168 hours  Recent Results (from the past 240 hour(s))  CSF CULTURE     Status: None   Collection Time    08/28/14 11:00 PM      Result Value Ref Range Status   Specimen Description CSF   Final   Special Requests NONE   Final   Gram Stain     Final   Value: NO WBC SEEN     NO ORGANISMS SEEN     CYTOSPIN SLIDE Performed at [facility]     Performed at [facility]   Culture     Final   Value: NO GROWTH 2 DAYS     Performed at [facility]   Report Status PENDING   Incomplete  CULTURE, BLOOD (ROUTINE X 2)     Status: None   Collection Time    08/30/14 12:09 PM      Result Value Ref Range Status    Specimen Description BLOOD LEFT ARM   Final   Special Requests BOTTLES DRAWN AEROBIC AND ANAEROBIC 10CC   Final   Culture NO GROWTH 2 DAYS   Final   Report Status PENDING   Incomplete  MRSA PCR SCREENING     Status: None   Collection Time    08/30/14  2:30 PM      Result Value Ref Range Status   MRSA by PCR NEGATIVE  NEGATIVE Final   Comment:            The GeneXpert MRSA Assay (FDA     approved for NASAL specimens     only), is one component of a     comprehensive MRSA colonization     surveillance program. It is not     intended to diagnose MRSA     infection nor to guide or     monitor treatment for     MRSA infections.  CULTURE, BLOOD (ROUTINE X 2)     Status: None   Collection Time    08/30/14  2:36 PM      Result Value Ref Range Status   Specimen Description BLOOD RIGHT FOREARM   Final   Special Requests BOTTLES DRAWN AEROBIC AND ANAEROBIC 6CC   Final   Culture NO GROWTH 2 DAYS   Final   Report Status PENDING   Incomplete     Studies:              All Imaging reviewed and is as per above notation   Scheduled Meds: . antiseptic oral rinse  7 mL Mouth Rinse q12n4p  . aspirin EC  81 mg Oral Daily  . ceFEPime (MAXIPIME) IV  1 g Intravenous Q8H  . chlorhexidine  15 mL Mouth Rinse BID  . clopidogrel  75 mg Oral Daily  . donepezil  10 mg Oral QHS  . escitalopram  10 mg Oral Daily  . fentaNYL  100 mcg Transdermal Q72H  . heparin  5,000 Units Subcutaneous 3 times per day  . levothyroxine  25 mcg Oral QAC breakfast  . memantine  5 mg Oral Daily  . polyethylene glycol  17 g Oral Daily  . vancomycin  1,000 mg Intravenous Q12H   Continuous Infusions: . sodium chloride 50 mL/hr at 09/01/14 0500     Assessment/Plan:  1. Toxic met encephalopathy-2/2 to PNA and hypoxic resp failure as well as multiple chornic pain meds and antidpressants-had been started on flexeril on last admission.  We will narrow polyphamracy-see#6 2. Acute Hypoxic/hypercarbic resp  failure-2/2 to  PNA-attempt to wean NRB.  No further need Blood gases for right now.  Get CXR in am. 3. Sepsis 2/2 HCAP vs Aspiration-Fever improved.  Await Blood/sputum.  Get speech eval-broad spectrum Vanc and Cefepime.  Procalcitonin confirms sepsis and is declining-low suspicion for CHF at this time given CT scan showing PNA 08/31/14.   4. CAD c stent 2009-last stress 2012 neg-stable-Oral asa 81 daily.  Hold Coreg 3.125 for now.   5. TIA-continue ASA 81 and plavix 75 daily-confirm as OP 6. Hypokalemia-replace orally with potassium 40 daily 7. Chronic pain-discussed with patient slow re-implementation of pain meds-start back Fentnyl 100, lower dose Oxycodone 5 q6 prn.  D/c toradol for now.  Cont Gabapentin 100 daily.  Would d/c Lyrica on discharge home 8. Constipation-probably 2/2 to narcotics-Add sorbitol 9/6 to miralax 17 9. Dementia-continue Namenda 5 daily, Aricept 10 qhs 10. Hypotension on admission-Coreg 3.125 bid, Amlodipine 10 daily on hold for now-marginally better-needs monitoring 11. Depression-continue Lexapro 10.  Trazadone on hold for now  Code Status: Full Family Communication: called wife and updated Disposition Plan: inpatient SDU-transfer tele 1-2 d   Dustin Griffes, MD  [name] Pager [phone] 09/01/2014, 8:49 AM    LOS: 2 days

## 2014-09-01 NOTE — Progress Notes (Signed)
ANTIBIOTIC CONSULT NOTE -   Pharmacy Consult for Vancomycin & Cefepime Indication: pneumonia  No Known Allergies  Patient Measurements: Height: 6' (182.9 cm) Weight: 198 lb 10.2 oz (90.1 kg) IBW/kg (Calculated) : 77.6  Vital Signs: Temp: 98.5 F (36.9 C) (09/06 0800) Temp src: Axillary (09/06 0800) BP: 102/71 mmHg (09/06 0900) Pulse Rate: 57 (09/06 0900) Intake/Output from previous day: 09/05 0701 - 09/06 0700 In: 2130 [P.O.:480; I.V.:1100; IV Piggyback:550] Out: 1000 [Urine:1000] Intake/Output from this shift:    Labs:  Recent Labs  08/30/14 1107 08/31/14 0522 09/01/14 0517  WBC 9.2 15.0* 11.1*  HGB 12.3* 11.1* 10.2*  PLT 183 174 173  CREATININE 0.86 0.81 0.54   Estimated Creatinine Clearance: 84.9 ml/min (by C-G formula based on Cr of 0.54).  Recent Labs  09/01/14 1152  [place] 10.2     Microbiology: Recent Results (from the past 720 hour(s))  CSF CULTURE     Status: None   Collection Time    08/28/14 11:00 PM      Result Value Ref Range Status   Specimen Description CSF   Final   Special Requests NONE   Final   Gram Stain     Final   Value: NO WBC SEEN     NO ORGANISMS SEEN     CYTOSPIN SLIDE Performed at [facility]     Performed at [facility]   Culture     Final   Value: NO GROWTH 2 DAYS     Performed at [facility]   Report Status PENDING   Incomplete  CULTURE, BLOOD (ROUTINE X 2)     Status: None   Collection Time    08/30/14 12:09 PM      Result Value Ref Range Status   Specimen Description BLOOD LEFT ARM   Final   Special Requests BOTTLES DRAWN AEROBIC AND ANAEROBIC 10CC   Final   Culture NO GROWTH 2 DAYS   Final   Report Status PENDING   Incomplete  MRSA PCR SCREENING     Status: None   Collection Time    08/30/14  2:30 PM      Result Value Ref Range Status   MRSA by PCR NEGATIVE  NEGATIVE Final   Comment:            The GeneXpert MRSA Assay (FDA     approved for NASAL specimens     only), is one  component of a     comprehensive MRSA colonization     surveillance program. It is not     intended to diagnose MRSA     infection nor to guide or     monitor treatment for     MRSA infections.  CULTURE, BLOOD (ROUTINE X 2)     Status: None   Collection Time    08/30/14  2:36 PM      Result Value Ref Range Status   Specimen Description BLOOD RIGHT FOREARM   Final   Special Requests BOTTLES DRAWN AEROBIC AND ANAEROBIC 6CC   Final   Culture NO GROWTH 2 DAYS   Final   Report Status PENDING   Incomplete    Medical History: Past Medical History  Diagnosis Date  . Arthritis   . Coronary atherosclerosis of native coronary artery     a. Multivessel s/p CABG 1996. b. s/p DES x 2 SVG to PDA 8/12 with distal disease managed medically.  . Mixed hyperlipidemia   . Essential hypertension, benign   .  MI (myocardial infarction)   . Hematuria   . OSA (obstructive sleep apnea)   . Anxiety   . OA (osteoarthritis)   . DDD (degenerative disc disease)     Chronic back pain  . Alzheimer disease   . Enlarged prostate   . Hypothyroidism   . Atrophic gastritis     a. By EGD 02/2013.    Medications:  Scheduled:  . antiseptic oral rinse  7 mL Mouth Rinse q12n4p  . aspirin EC  81 mg Oral Daily  . ceFEPime (MAXIPIME) IV  1 g Intravenous Q8H  . chlorhexidine  15 mL Mouth Rinse BID  . clopidogrel  75 mg Oral Daily  . donepezil  10 mg Oral QHS  . escitalopram  10 mg Oral Daily  . fentaNYL  100 mcg Transdermal Q72H  . heparin  5,000 Units Subcutaneous 3 times per day  . levothyroxine  25 mcg Oral QAC breakfast  . memantine  5 mg Oral Daily  . polyethylene glycol  17 g Oral Daily  . potassium chloride  40 mEq Oral Daily  . sorbitol  20 mL Oral BID  . vancomycin  1,000 mg Intravenous Q12H   Assessment: 80 yo M discharged to [facility] 9/3 after brief admission for neck pain.  He returned to ED today with altered mental status & fever 101.5 F.   WBC & lactic acid levels are normal.  CXR 9/3 with  possible infiltrates.  Renal function is at patient's baseline.  Vancomycin trough below goal  Vancomycin 9/4>> Cefepime 9/4>>  Goal of Therapy:  Vancomycin trough level 15-20 mcg/ml  Plan:  Continue Cefepime 1gm IV q8h Increase Vancomycin to 1250 mg IV q12h Check Vancomycin trough at steady state Monitor renal function and cx data    Abner Greenspan, Likisha Alles Bennett 09/01/2014,1:19 PM

## 2014-09-02 ENCOUNTER — Inpatient Hospital Stay (HOSPITAL_COMMUNITY): Payer: Medicare Other

## 2014-09-02 LAB — BASIC METABOLIC PANEL
ANION GAP: 9 (ref 5–15)
BUN: 11 mg/dL (ref 6–23)
CHLORIDE: 100 meq/L (ref 96–112)
CO2: 31 mEq/L (ref 19–32)
Calcium: 8.3 mg/dL — ABNORMAL LOW (ref 8.4–10.5)
Creatinine, Ser: 0.55 mg/dL (ref 0.50–1.35)
GFR calc Af Amer: >90 mL/min (ref >90)
Glucose, Bld: 101 mg/dL — ABNORMAL HIGH (ref 70–99)
POTASSIUM: 4.1 meq/L (ref 3.7–5.3)
Sodium: 140 mEq/L (ref 137–147)

## 2014-09-02 LAB — CBC WITH DIFFERENTIAL/PLATELET
BASOS PCT: 0 % (ref 0–1)
Basophils Absolute: 0 10*3/uL (ref 0.0–0.1)
EOS ABS: 0.2 10*3/uL (ref 0.0–0.7)
Eosinophils Relative: 3 % (ref 0–5)
HCT: 33.1 % — ABNORMAL LOW (ref 39.0–52.0)
HEMOGLOBIN: 10.9 g/dL — AB (ref 13.0–17.0)
LYMPHS ABS: 1.2 10*3/uL (ref 0.7–4.0)
Lymphocytes Relative: 14 % (ref 12–46)
MCH: 31.8 pg (ref 26.0–34.0)
MCHC: 32.9 g/dL (ref 30.0–36.0)
MCV: 96.5 fL (ref 78.0–100.0)
MONOS PCT: 7 % (ref 3–12)
Monocytes Absolute: 0.6 10*3/uL (ref 0.1–1.0)
NEUTROS PCT: 76 % (ref 43–77)
Neutro Abs: 6.5 10*3/uL (ref 1.7–7.7)
PLATELETS: 192 10*3/uL (ref 150–400)
RBC: 3.43 MIL/uL — AB (ref 4.22–5.81)
RDW: 11.9 % (ref 11.5–15.5)
WBC: 8.5 10*3/uL (ref 4.0–10.5)

## 2014-09-02 LAB — LEGIONELLA ANTIGEN, URINE: LEGIONELLA ANTIGEN, URINE: NEGATIVE

## 2014-09-02 MED ORDER — ALPRAZOLAM 1 MG PO TABS
1.0000 mg | ORAL_TABLET | Freq: Every day | ORAL | Status: DC
  Administered 2014-09-02: 1 mg via ORAL
  Filled 2014-09-02: qty 1

## 2014-09-02 MED ORDER — DIPHENHYDRAMINE HCL 25 MG PO CAPS
25.0000 mg | ORAL_CAPSULE | Freq: Once | ORAL | Status: AC
  Administered 2014-09-02: 25 mg via ORAL
  Filled 2014-09-02: qty 1

## 2014-09-02 MED ORDER — IBUPROFEN 800 MG PO TABS
400.0000 mg | ORAL_TABLET | Freq: Three times a day (TID) | ORAL | Status: DC | PRN
  Administered 2014-09-03: 400 mg via ORAL
  Filled 2014-09-02: qty 1

## 2014-09-02 MED ORDER — MILK AND MOLASSES ENEMA
1.0000 | Freq: Once | RECTAL | Status: AC
  Administered 2014-09-02: 250 mL via RECTAL

## 2014-09-02 MED ORDER — SENNA 8.6 MG PO TABS
1.0000 | ORAL_TABLET | Freq: Two times a day (BID) | ORAL | Status: DC
  Administered 2014-09-02 (×2): 8.6 mg via ORAL
  Filled 2014-09-02 (×2): qty 1

## 2014-09-02 MED ORDER — LEVOFLOXACIN 500 MG PO TABS
500.0000 mg | ORAL_TABLET | Freq: Every day | ORAL | Status: DC
  Administered 2014-09-02 – 2014-09-03 (×2): 500 mg via ORAL
  Filled 2014-09-02 (×2): qty 1

## 2014-09-02 NOTE — Progress Notes (Signed)
Pateitn is currently on nasal cannula at this time;patient is in no distress;BIPAP is on standby if needed

## 2014-09-02 NOTE — Care Management Note (Signed)
    Page 1 of 2   09/03/2014     1:27:12 PM CARE MANAGEMENT NOTE 09/03/2014  Patient:  ALLANTE, WHITMIRE   Account Number:  [id number]  Date Initiated:  09/02/2014  Documentation initiated by:  Vladimir Creeks  Subjective/Objective Assessment:   Admitted with early sepsis, and acute respiratory failure. Pt is from the [facility], and will be returning there     Action/Plan:   CSW following and will facilitate move to [facility]   Anticipated DC Date:  09/04/2014   Anticipated DC Plan:  [place]  In-house referral  Clinical Social Worker      DC Forensic scientist  CM consult      [facility] Choice  HOME HEALTH   Choice offered to / List presented to:  C-4 Adult Children        [employer] arranged  HH-1 RN  [place].   Status of service:  Completed, signed off Medicare Important Message given?  YES (If response is "NO", the following Medicare IM given date fields will be blank) Date Medicare IM given:  09/03/2014 Medicare IM given by:  Vladimir Creeks Date Additional Medicare IM given:   Additional Medicare IM given by:    Discharge Disposition:  [place]  Per UR Regulation:  Reviewed for med. necessity/level of care/duration of stay  If discussed at Long Length of Stay Meetings, dates discussed:    Comments:  09/03/14 [place] RN/CM  Daughter-in-law visiting and she spoke with pt, his brother,  and Mrs.Waynick and they have decided to take the pt home with [facility]. They want [facility]. [place] set up with [facility]. 09/03/14 1100 Deckard Stuber RN/CM  Spoke with spouse and gave her the IM over the phone, and left copy in room, as pt was to go to [facility] in [place]. Daughter-in-law visiting and she s 09/02/14 1545 Vladimir Creeks RN/CM

## 2014-09-02 NOTE — Progress Notes (Signed)
Note: This document was prepared with digital dictation and possible smart phrase technology. Any transcriptional errors that result from this process are unintentional.   Dustin [name] NOT:[id number] DOB: Nov 01, 1934 DOA: 08/30/2014 PCP: Glo Herring., MD  Brief narrative: 80 y/o ? h/o grade 1 diastolic heart failure, prior chest pain-Stent in 2009-history 2012-stress test 06/29/14 EF 40-no reversible ischemia, left-sided weakness secondary to TIA, hyperlipidemia, obstructive sleep apnea not on CPAP secondary to noncompliance with bipap, hypertension, Possible alzheimer/Mulit-infarct dementia, , prior Esophagus stricture 02/2013/chronic constipation D/C 08/29/14 to NH after being admitted for eck pain and re-admitted to [facility] with hypoxia confusion fever and possible PNA He was initially place don BiPAP and was slowly weaned off of this down to Nasal canula and his confusion subsequently improved-it was felt that his confusion was partially because of his pneumonia but also because of his multiple medications .   Past medical history-As per Problem list Chart reviewed as below- reviewed  Consultants:  Speech  Procedures:  none  Antibiotics:  Zosyn 9/4-  Vanc 9/4-09/02/13  Cefepime 9/4-09/02/13  Levaquin 09/03/39   Subjective   He is looking much better Much more coherent Tolerating by mouth very well Had a stool with milk of molasses enema Brother very upset that Senokot has been held and seemed very angry earlier today-seems to understand now   Objective    Interim History:   Telemetry: Sinus, slightly low BP   Objective: Filed Vitals:   09/01/14 0900 09/01/14 1411 09/01/14 2231 09/02/14 0555  BP: 102/71 110/45 134/52 134/55  Pulse: 57 54 57 60  Temp:  97 F (36.1 C) 98.1 F (36.7 C) 98.7 F (37.1 C)  TempSrc:  Axillary Oral Oral  Resp: '12 12 12 12  ' Height:      Weight:      SpO2: 94% 98% 100% 98%    Intake/Output Summary (Last 24 hours) at 09/02/14  1123 Last data filed at 09/02/14 1014  Gross per 24 hour  Intake 1638.33 ml  Output   1175 ml  Net 463.33 ml    Exam:  General: alert no distress, alert oriented x3 Cardiovascular: s1 s 2no m/r/g Respiratory: clear no added sound-clinically clear Abdomen: soft Nt Nd Skin no le edema Neuro intact  Data Reviewed: Basic Metabolic Panel:  Recent Labs Lab 08/28/14 2112 08/30/14 1107 08/31/14 0522 09/01/14 0517 09/02/14 0516  NA 139 139 140 140 140  K 4.3 4.3 3.9 3.6* 4.1  CL 96 97 100 102 100  CO2 33* 35* 32 33* 31  GLUCOSE 114* 143* 157* 101* 101*  BUN '9 20 23 15 11  ' CREATININE 0.70 0.86 0.81 0.54 0.55  CALCIUM 9.4 8.9 8.1* 8.2* 8.3*   Liver Function Tests:  Recent Labs Lab 08/28/14 2112 08/30/14 1107 08/31/14 0522 09/01/14 0517  AST '21 14 14 17  ' ALT '14 11 9 10  ' ALKPHOS 62 45 33* 34*  BILITOT 1.1 1.2 1.0 0.6  PROT 7.7 6.7 5.6* 5.5*  ALBUMIN 4.1 3.3* 2.6* 2.5*   No results found for this basename: LIPASE, AMYLASE,  in the last 168 hours No results found for this basename: AMMONIA,  in the last 168 hours CBC:  Recent Labs Lab 08/28/14 2112 08/30/14 1107 08/31/14 0522 09/01/14 0517 09/02/14 0516  WBC 9.9 9.2 15.0* 11.1* 8.5  NEUTROABS 7.8* 6.9 13.3* 8.8* 6.5  HGB 13.7 12.3* 11.1* 10.2* 10.9*  HCT 40.7 37.2* 33.3* 31.0* 33.1*  MCV 97.1 97.6 97.1 96.6 96.5  PLT 208 183 174 173  192   Cardiac Enzymes:  Recent Labs Lab 08/30/14 1107  TROPONINI <0.30   BNP: No components found with this basename: POCBNP,  CBG: No results found for this basename: GLUCAP,  in the last 168 hours  Recent Results (from the past 240 hour(s))  CSF CULTURE     Status: None   Collection Time    08/28/14 11:00 PM      Result Value Ref Range Status   Specimen Description CSF   Final   Special Requests NONE   Final   Gram Stain     Final   Value: NO WBC SEEN     NO ORGANISMS SEEN     CYTOSPIN SLIDE Performed at [facility]     Performed at [facility]   Culture     Final   Value: NO GROWTH 3 DAYS     Performed at [facility]   Report Status 09/01/2014 FINAL   Final  CULTURE, BLOOD (ROUTINE X 2)     Status: None   Collection Time    08/30/14 12:09 PM      Result Value Ref Range Status   Specimen Description BLOOD LEFT ARM   Final   Special Requests BOTTLES DRAWN AEROBIC AND ANAEROBIC 10CC   Final   Culture NO GROWTH 2 DAYS   Final   Report Status PENDING   Incomplete  MRSA PCR SCREENING     Status: None   Collection Time    08/30/14  2:30 PM      Result Value Ref Range Status   MRSA by PCR NEGATIVE  NEGATIVE Final   Comment:            The GeneXpert MRSA Assay (FDA     approved for NASAL specimens     only), is one component of a     comprehensive MRSA colonization     surveillance program. It is not     intended to diagnose MRSA     infection nor to guide or     monitor treatment for     MRSA infections.  CULTURE, BLOOD (ROUTINE X 2)     Status: None   Collection Time    08/30/14  2:36 PM      Result Value Ref Range Status   Specimen Description BLOOD RIGHT FOREARM   Final   Special Requests BOTTLES DRAWN AEROBIC AND ANAEROBIC 6CC   Final   Culture NO GROWTH 2 DAYS   Final   Report Status PENDING   Incomplete  CULTURE, RESPIRATORY (NON-EXPECTORATED)     Status: None   Collection Time    08/31/14 10:36 PM      Result Value Ref Range Status   Specimen Description SPUTUM   Final   Special Requests NONE   Final   Gram Stain PENDING   Incomplete   Culture     Final   Value: NO GROWTH     Performed at [facility]   Report Status PENDING   Incomplete     Studies:              All Imaging reviewed and is as per above notation   Scheduled Meds: . antiseptic oral rinse  7 mL Mouth Rinse q12n4p  . aspirin EC  81 mg Oral Daily  . ceFEPime (MAXIPIME) IV  1 g Intravenous Q8H  . chlorhexidine  15 mL Mouth Rinse BID  . clopidogrel  75 mg Oral Daily  .  donepezil  10 mg Oral QHS  . escitalopram   10 mg Oral Daily  . fentaNYL  100 mcg Transdermal Q72H  . heparin  5,000 Units Subcutaneous 3 times per day  . levothyroxine  25 mcg Oral QAC breakfast  . memantine  5 mg Oral Daily  . polyethylene glycol  17 g Oral Daily  . potassium chloride  40 mEq Oral Daily  . senna  1 tablet Oral BID  . sorbitol  20 mL Oral BID  . vancomycin  1,250 mg Intravenous Q12H   Continuous Infusions: . sodium chloride 50 mL/hr at 09/02/14 0522     Assessment/Plan:  1. Toxic met encephalopathy-2/2 to PNA and hypoxic resp failure as well as multiple chornic pain meds and antidpressants-had been started on flexeril on last admission.  We will narrow polyphamracy-see#6 2. Acute Hypoxic/hypercarbic resp failure-2/2 to PNA-attempt to wean NRB.  No further need Blood gases for right now.  chest x-ray 9/7 still pending. Attempt to wean off of oxygen. 3. Mild uncomplicated hemoptysis-streaking up some blood with sputum. White count and hemoglobin is stable. Monitor 4. Sepsis 2/2 HCAP vs Aspiration-Fever improved.  Await Blood/sputum.  Get speech eval-broad spectrum Vanc and Cefepime.  Procalcitonin confirms sepsis and is declining-narrow antibiotics to by mouth Levaquin 9/7 low suspicion for CHF at this time given CT scan showing PNA 08/31/14.  White count has declined from 15-11->8 and we will stop checking. 5. CAD c stent 2009-last stress 2012 neg-stable-Oral asa 81 daily-this will be held as patient has had mild hemoptysis.  Hold Coreg 3.125 for now.   6. TIA-hold ASA 81 for now, continue plavix 75 daily-and reimplement aspirin given hemoptysis 7. Hypokalemia-replace orally with potassium 40 daily 8. Chronic pain-discussed with patient slow re-implementation of pain meds-start back Fentnyl 100, lower dose Oxycodone 5 q6 prn.  D/c toradol for now.  Cont Gabapentin 100 daily.  Would d/c Lyrica on discharge home 9. Constipation-probably 2/2 to narcotics-Add sorbitol 9/6 to miralax 17-given milk of molasses enema  09/02/14 10. Mild dementia-continue Namenda 5 daily, Aricept 10 qhs 11. Hypotension on admission-Coreg 3.125 bid, Amlodipine 10 daily on hold for now-marginally better-needs monitoring and reimplement Coreg 3.5 twice a day if able to tolerate in am 12. Depression-continue Lexapro 10.  Trazadone on hold for now  Code Status: Full Family Communication: called wife and updated on telephone  Disposition Plan:potential discharge to nursing home on today's    Verneita Griffes, MD  [name] Pager [phone] 09/02/2014, 11:23 AM    LOS: 3 days

## 2014-09-02 NOTE — Evaluation (Signed)
Clinical/Bedside Swallow Evaluation Patient Details  Name: Dustin [name] MRN: [id number] Date of Birth: June 13, 1934  Today's Date: 09/02/2014 Time: 8295-6213 SLP Time Calculation (min): 39 min  Past Medical History:  Past Medical History  Diagnosis Date  . Arthritis   . Coronary atherosclerosis of native coronary artery     a. Multivessel s/p CABG 1996. b. s/p DES x 2 SVG to PDA 8/12 with distal disease managed medically.  . Mixed hyperlipidemia   . Essential hypertension, benign   . MI (myocardial infarction)   . Hematuria   . OSA (obstructive sleep apnea)   . Anxiety   . OA (osteoarthritis)   . DDD (degenerative disc disease)     Chronic back pain  . Alzheimer disease   . Enlarged prostate   . Hypothyroidism   . Atrophic gastritis     a. By EGD 02/2013.   Past Surgical History:  Past Surgical History  Procedure Laterality Date  . Coronary artery bypass graft  1996    LIMA to LAD, SVG to D2, SVG to PDA, SVG to OM1 and OM2  . Hernia repair    . Coronary angioplasty with stent placement    . Colonoscopy  08/03/2004    Jenkins-numerous large diverticula in the descending, transverse, descending, and sigmoid colon. Otherwise normal exam.  . Colonoscopy  07/12/2012    RMR: External hemorrhoidal tag; multiple rectal and colonic polyps removed and/or treated as described above. Pancolonic diverticulosis. Bx-tubular adenomas, rectal hyperplastic polyp. next colonoscopy in 06/2015.  [name] Esophagogastroduodenoscopy N/A 03/16/2013    Procedure: ESOPHAGOGASTRODUODENOSCOPY (EGD);  Surgeon: Daneil Dolin, MD;  Location: [facility];  Service: Endoscopy;  Laterality: N/A;  12:00-moved to [place] notified pt  . Esophagogastroduodenoscopy N/A 03/06/2013    Procedure: ESOPHAGOGASTRODUODENOSCOPY (EGD);  Surgeon: Daneil Dolin, MD;  Location: [facility];  Service: Endoscopy;  Laterality: N/A;   HPI:  80 y/o ? h/o grade 1 diastolic heart failure, prior chest pain-Stent in 2009-history  2012-stress test 06/29/14 EF 40-no reversible ischemia, left-sided weakness secondary to TIA, hyperlipidemia, obstructive sleep apnea not on CPAP secondary to noncompliance with bipap, hypertension, Possible alzheimer/Mulit-infarct dementia, , prior Esophagus stricture 02/2013/chronic constipation   Assessment / Plan / Recommendation Clinical Impression  Dustin [name] is known to this SLP from admission last week and BSE completed on 08/29/2014. At that time, he was more lethargic from muscle relaxant. Today, he is alert and sitting upright in the recliner, but complaining of HA. RN provided pain medication. [name] was present for the evaluation. Dustin [name] shows no overt signs/symptoms of aspiration today. Subjectively, he looks much better than when he was seen last Thursday (pt does not recall the visit). It is possible that did aspirate last week when he was bedbound and on muscle relaxants. Pt has history of esophageal impaction (meat) March 2014, but he reports no difficulties since that time. Recommend regular diet with thin liquids with standard aspiration and reflux precautions; Pt to consume all meals up in recliner chair. Pt acknowledges recommendations and reviewed with his brother. SLP will f/u tomorrow if pt is still here. Pt and brother inquired about whether pt could receive PT.     Aspiration Risk  Mild    Diet Recommendation Regular;Thin liquid   Liquid Administration via: Cup;Straw Medication Administration: Whole meds with liquid Supervision: Patient able to self feed;Intermittent supervision to cue for compensatory strategies Postural Changes and/or Swallow Maneuvers: Out of bed for meals;Seated upright 90 degrees;Upright 30-60 min after  meal    Other  Recommendations Oral Care Recommendations: Oral care BID Other Recommendations: Clarify dietary restrictions   Follow Up Recommendations  Skilled Nursing facility    Frequency and Duration min 2x/week  1 week   Pertinent  Vitals/Pain VSS    SLP Swallow Goals   Pt will demonstrate safe and efficient consumption of least restrictive diet with use of strategies as needed.   Swallow Study Prior Functional Status   Living at home with his wife prior to last week; admitted from [facility].    General Date of Onset: 08/30/14 HPI: 80 y/o ? h/o grade 1 diastolic heart failure, prior chest pain-Stent in 2009-history 2012-stress test 06/29/14 EF 40-no reversible ischemia, left-sided weakness secondary to TIA, hyperlipidemia, obstructive sleep apnea not on CPAP secondary to noncompliance with bipap, hypertension, Possible alzheimer/Mulit-infarct dementia, , prior Esophagus stricture 02/2013/chronic constipation Type of Study: Bedside swallow evaluation Previous Swallow Assessment:  (BSE on 08/29/2014) Diet Prior to this Study: Dysphagia 2 (chopped);Thin liquids Temperature Spikes Noted: No Respiratory Status: Room air Behavior/Cognition: Alert;Cooperative;Pleasant mood Oral Cavity - Dentition: Adequate natural dentition Self-Feeding Abilities: Able to feed self Patient Positioning: Upright in chair Baseline Vocal Quality: Clear Volitional Cough: Weak;Congested Volitional Swallow: Able to elicit    Oral/Motor/Sensory Function Overall Oral Motor/Sensory Function: Appears within functional limits for tasks assessed   Ice Chips Ice chips: Within functional limits Presentation: Spoon   Thin Liquid Thin Liquid: Within functional limits Presentation: Cup;Self Fed;Straw    Nectar Thick Nectar Thick Liquid: Not tested   Honey Thick Honey Thick Liquid: Not tested   Puree Puree: Within functional limits Presentation: Spoon   Solid       Solid: Within functional limits Presentation: Self Fed      Thank you,  Genene Churn, [place]  Kenyada Dosch 09/02/2014,2:39 PM

## 2014-09-02 NOTE — Clinical Social Work Psychosocial (Signed)
Clinical Social Work Department BRIEF PSYCHOSOCIAL ASSESSMENT 09/02/2014  Patient:  Dustin [name], Dustin [name]     Account Number:  [id number]     Admit date:  08/30/2014  Clinical Social Worker:  Wyatt Haste  Date/Time:  09/02/2014 11:23 AM  Referred by:  CSW  Date Referred:  09/02/2014 Referred for  SNF Placement   Other Referral:   Interview type:  Family Other interview type:   brother- Dustin [name]  wife- Dustin [name]    PSYCHOSOCIAL DATA Living Status:  FACILITY Admitted from facility:  [facility] Level of care:  [place] Primary support name:  Dustin [name] Primary support relationship to patient:  SPOUSE Degree of support available:   supportive    CURRENT CONCERNS Current Concerns  Post-Acute Placement   Other Concerns:    SOCIAL WORK ASSESSMENT / PLAN CSW attempted to meet with pt at bedside, but pt on bedside commode. [name] in waiting room. Pt known to CSW from d/c to [facility] on Thursday. He returned the next day with fever and altered mental status. [name] had surgery on Friday and has not been up here as she is not fully weight bearing. [name] was frustrated with [facility] as he felt pt needed constant care and staff were not in his room that often. He intended to take pt home with home health from hospital. CSW informed him that as there is no HCPOA, pt's wife would have to agree to this plan. Spoke with her on phone and she was concerned about pt's ability to manage at home, especially due to her limitations right now. Per RN, pt is requiring almost 2 person assist to bedside commode. [name] definitely feels pt will need to return to [facility] due to level of assist required. CSW requested that she notify pt's brother of decision and she agreed. CSW spoke with Marianna Fuss on Friday about pt prior to his arrival to floor. Okay for return pending bed availability and no FL2 needed. Anticipate possible d/c tomorrow.   Assessment/plan status:  Psychosocial Support/Ongoing  Assessment of Needs Other assessment/ plan:   Information/referral to community resources:   [facility]    PATIENT'S/FAMILY'S RESPONSE TO PLAN OF CARE: Pt unable to discuss plan of care at this time. He has dementia and is oriented to self and aware of why he is in hospital, but otherwise has confusion per RN. [name] feels pt is unable to return home until he is able to complete ADLs more independently.       [name], [place]

## 2014-09-03 MED ORDER — ALPRAZOLAM 1 MG PO TABS: 1.0000 mg | ORAL_TABLET | Freq: Every day | ORAL | Status: DC | PRN

## 2014-09-03 MED ORDER — OXYCODONE HCL 5 MG PO TABS: 5.0000 mg | ORAL_TABLET | ORAL | Status: DC | PRN

## 2014-09-03 MED ORDER — SORBITOL 70 % SOLN: 20.0000 mL | Freq: Two times a day (BID) | Status: DC

## 2014-09-03 MED ORDER — LEVOFLOXACIN 500 MG PO TABS: 500.0000 mg | ORAL_TABLET | Freq: Every day | ORAL | Status: DC

## 2014-09-03 NOTE — Clinical Social Work Note (Signed)
Pt expressed this morning that he did not want to go back to [facility] and preferred [facility]. CSW discussed with pt's wife who agrees since this is his preference. Facility accepts and can provide transport this afternoon as he is d/c. [facility] notified of decision with pt's wife's permission. D/C summary faxed.   [name], [place]

## 2014-09-03 NOTE — Clinical Social Work Note (Signed)
Pt has now refused to go to SNF and pt's wife is agreeable to return home. [name] and daughter-in-law are in room and will take pt home. CSW notified Levada Dy at [facility]. RN and MD also aware. CM arranging home health.   Dustin [name], [place]

## 2014-09-03 NOTE — Discharge Summary (Addendum)
Physician Discharge Summary  JANET DECESARE BOF:[id number] DOB: [birth year] DOA: 08/30/2014  PCP: Glo Herring., MD  Admit date: 08/30/2014 Discharge date: 09/03/2014  Time spent: 35 minutes  Recommendations for Outpatient Follow-up:  1. Patient had hemoptysis likely secondary to do antiplatelet therapy in addition to ibuprofen-this was considered as non-life-threatening and should resolve on its own. I would recommend a CBC in about a week and discontinuation of aspirin 81 mg for about one week until his pneumonia resolves.  CT scan commented on on a pneumonia and no pulmonary hemorrhage-if persistent, consider pulmonary outpatient followup 2. Needs to complete blood count, chem 12, INR in about one week 3. Patient to decide on either home health nursing or discharge to [facility] preference is either the [facility] where her family member works or home with private care 4. Patient to complete course of Levaquin 09/08/14  5. Patient may require oxygen as an outpatient based on the sunscreen 6. Repeat chest x-ray 4 weeks to denote clearing of pneumonia 7. Recommend that patient's polypharmacy be significantly decreased. 8. Recommend outpatient TSH in about 6 weeks  Discharge Diagnoses:  Principal Problem:   Sepsis Active Problems:   HYPERLIPIDEMIA-MIXED   OBSTRUCTIVE SLEEP APNEA   HYPERTENSION, BENIGN   Coronary atherosclerosis of native coronary artery   Chronic constipation   Tubular adenoma of colon   Left-sided weakness   Fever   Healthcare associated bacterial pneumonia   Toxic Metabolic encephalopathy   Discharge Condition: Fair  Diet recommendation: Heart healthy  Filed Weights   08/30/14 1908 08/31/14 0500 09/01/14 0500  Weight: 88.3 kg (194 lb 10.7 oz) 90 kg (198 lb 6.6 oz) 90.1 kg (198 lb 10.2 oz)    History of present illness:  81 y/o ? h/o grade 1 diastolic heart failure, prior chest pain-Stent in 2009-history 2012-stress test 06/29/14 EF 40-no reversible  ischemia, left-sided weakness secondary to TIA, hyperlipidemia, obstructive sleep apnea not on CPAP secondary to noncompliance with bipap, hypertension, Possible alzheimer/Mulit-infarct dementia, , prior Esophagus stricture 02/2013/chronic constipation  D/C 08/29/14 to NH after being admitted for eck pain and re-admitted to [facility] with hypoxia confusion fever and possible PNA  He was initially place don BiPAP and was slowly weaned off of this down to Nasal canula and his confusion subsequently improved-it was felt that his confusion was partially because of his pneumonia but also because of his multiple medications .   Hospital Course:   1. Toxic met encephalopathy-2/2 to PNA and hypoxic resp failure as well as multiple chronic pain meds and antidpressants-had been started on flexeril on last admission. We will narrow polyphamracy-see#6 2. Acute Hypoxic/hypercarbic resp failure-2/2 to PNA-attempt to wean NRB. No further need Blood gases for right now. chest x-ray 9/7 still pending. Attempt to wean off of oxygen. 3. Mild uncomplicated hemoptysis-streaking up some blood with sputum. White count and hemoglobin is stable. Monitor-i have held off his aspirin after curbside discussion with Cardiology Dr. Domenic Polite who thinks after discussion patient may be able to restart this in a week in addition to his Plavix and ultimately and they may need to discuss need for both in and  4. Sepsis 2/2 HCAP vs Aspiration-Fever improved.  blood sputum cultures from admission 9/5 negative so far .  [name] Procalcitonin confirms sepsis and is declining-broad spectrum Vanc and Cefepime narrowed to  Levaquin 9/7 low suspicion for CHF at this time given CT scan showing PNA 08/31/14. White count has declined from 15-11->8 and we will stop checking. 5. CAD c stent  2009-last stress 2012 neg-stable-Oral asa 81 daily-this will be held as patient has had mild hemoptysis. Hold Coreg 3.125 for now.  6. TIA-hold ASA 81 for now, continue plavix 75  daily-and reimplement aspirin as an outpatient in about a week  7. Hypokalemia-replace orally with potassium 40 daily-recheck on discharge home and continue dosing  8. Chronic pain-discussed with patient slow re-implementation of pain meds-start back Fentnyl 100, lower dose Oxycodone 5 q6 prn. initially on toradol  as was confused and did not want his narcotic this was discontinued . Cont Gabapentin 100 daily. Would d/c Lyrica on discharge home 9. Constipation-probably 2/2 to narcotics-Add sorbitol 9/6 to miralax 17-given milk of molasses enema 09/02/14-discharge home once not as well as sorbitol  10. Mild dementia-continue Namenda 5 daily, Aricept 10 qhs 11. Hypotension on admission-Coreg 3.125 bid, Amlodipine 10 daily on hold  during hospital stay - re-implemented Coreg 3.5 twice on discharge  12. Depression-continue Lexapro 10. Trazadone restarted on discharge 13. Polypharmacy -please attempt as an outpatient once he returns to the outpatient setting to narrow medications to only essential medicines    Consultants:  Speech Procedures:  none Antibiotics:  Zosyn 9/4-  Vanc 9/4-09/02/13 Cefepime 9/4-09/02/14  Levaquin 09/02/14--09/08/14  Discharge Exam: Filed Vitals:   09/03/14 0648  BP: 118/73  Pulse: 55  Temp: 98.4 F (36.9 C)  Resp: 20    General: Alert pleasant oriented states categorically he does not want to go back to specific nursing facility, still has mild blood-tinged cough without other issues felt little warm overnight but no fever not  Cardiovascular: S1-S2 no murmur rub or gallop  Respiratory: Clinically clear no added sound   Discharge Instructions You were cared for by a hospitalist during your hospital stay. If you have any questions about your discharge medications or the care you received while you were in the hospital after you are discharged, you can call the unit and asked to speak with the hospitalist on call if the hospitalist that took care of you is not available.  Once you are discharged, your primary care physician will handle any further medical issues. Please note that NO REFILLS for any discharge medications will be authorized once you are discharged, as it is imperative that you return to your primary care physician (or establish a relationship with a primary care physician if you do not have one) for your aftercare needs so that they can reassess your need for medications and monitor your lab values.   Current Discharge Medication List    START taking these medications   Details  levofloxacin (LEVAQUIN) 500 MG tablet Take 1 tablet (500 mg total) by mouth daily. Qty: 5 tablet, Refills: 0    sorbitol 70 % SOLN Take 20 mLs by mouth 2 (two) times daily. Qty: 500 mL, Refills: 0      CONTINUE these medications which have CHANGED   Details  oxyCODONE (OXY IR/ROXICODONE) 5 MG immediate release tablet Take 1 tablet (5 mg total) by mouth every 4 (four) hours as needed for moderate pain. Qty: 30 tablet, Refills: 0      CONTINUE these medications which have NOT CHANGED   Details  ALPRAZolam (XANAX) 1 MG tablet Take 1 tablet (1 mg total) by mouth daily as needed. For anxiety Qty: 30 tablet, Refills: 5    carvedilol (COREG) 3.125 MG tablet Take 1 tablet by mouth 2 (two) times daily.    clopidogrel (PLAVIX) 75 MG tablet Take 75 mg by mouth daily.     donepezil (  ARICEPT) 10 MG tablet Take 1 tablet by mouth At bedtime.    escitalopram (LEXAPRO) 10 MG tablet Take 10 mg by mouth daily.    fentaNYL (DURAGESIC - DOSED MCG/HR) 100 MCG/HR Place 1 patch (100 mcg total) onto the skin every 3 (three) days. Qty: 10 patch, Refills: 0    gabapentin (NEURONTIN) 100 MG capsule Take 100 mg by mouth daily.    levothyroxine (SYNTHROID, LEVOTHROID) 25 MCG tablet Take 25 mcg by mouth daily.     NAMENDA 5 MG tablet Take 5 mg by mouth daily.     pravastatin (PRAVACHOL) 40 MG tablet TAKE (1) TABLET BY MOUTH AT BEDTIME. Qty: 30 tablet, Refills: 6    traZODone  (DESYREL) 50 MG tablet Take 50 mg by mouth at bedtime.       STOP taking these medications     amLODipine (NORVASC) 10 MG tablet      aspirin EC 81 MG tablet      cyclobenzaprine (FLEXERIL) 5 MG tablet      hydrochlorothiazide (,MICROZIDE/HYDRODIURIL,) 12.5 MG capsule      nitroGLYCERIN (NITROSTAT) 0.4 MG SL tablet      pregabalin (LYRICA) 75 MG capsule        No Known Allergies    The results of significant diagnostics from this hospitalization (including imaging, microbiology, ancillary and laboratory) are listed below for reference.    Significant Diagnostic Studies: Dg Chest 1 View  08/30/2014   CLINICAL DATA:  Cough, congestion.  EXAM: CHEST - 1 VIEW  COMPARISON:  08/28/2014  FINDINGS: Prior CABG. Low lung volumes. Cardiomegaly with vascular congestion. No confluent opacities or effusions. No acute bony abnormality.  IMPRESSION: Cardiomegaly with mild vascular congestion.  Low lung volumes.   Electronically Signed   By: Rolm Baptise M.D.   On: 08/30/2014 11:38   Ct Head Wo Contrast  08/30/2014   CLINICAL DATA:  Altered mental status  EXAM: CT HEAD WITHOUT CONTRAST  TECHNIQUE: Contiguous axial images were obtained from the base of the skull through the vertex without intravenous contrast.  COMPARISON:  Noncontrast CT scan of the brain of August 28, 2014  FINDINGS: There is mild diffuse cerebral and cerebellar atrophy. There is no intracranial hemorrhage nor intracranial mass effect. No acute ischemic changes are demonstrated. There is punctate basal ganglia calcification on the left. An old subcentimeter lacunar infarction in the right basal ganglia is present. The cerebellum and brainstem are normal.  The observed paranasal sinuses exhibit no air-fluid levels. There is mucoperiosteal thickening bilaterally in the ethmoid sinuses as well as and a right sphenoid sinus cell. This is slightly more conspicuous than on the previous study. There is no acute or healing skull fracture.  There is no cephalohematoma.  IMPRESSION: 1. There no evidence of an acute intracranial hemorrhage nor of an acute ischemic event. 2. There mild age related atrophic changes. 3. There are mild inflammatory change of the ethmoid and right sphenoid sinus cells.   Electronically Signed   By: David  Martinique   On: 08/30/2014 11:45   Ct Head Wo Contrast  08/28/2014   CLINICAL DATA:  Left-sided neck pain and headaches  EXAM: CT HEAD WITHOUT CONTRAST  CT CERVICAL SPINE WITHOUT CONTRAST  TECHNIQUE: Multidetector CT imaging of the head and cervical spine was performed following the standard protocol without intravenous contrast. Multiplanar CT image reconstructions of the cervical spine were also generated.  COMPARISON:  06/27/2014  FINDINGS: CT HEAD FINDINGS  The bony calvarium is intact. Mild atrophic changes are  noted. No findings to suggest acute hemorrhage, acute infarction or space-occupying mass lesion are noted.  CT CERVICAL SPINE FINDINGS  Seven cervical segments are well visualized. Mild osteophytic changes are noted. No acute fracture or acute facet abnormality is noted. The surrounding soft tissues are unremarkable. Mild facet hypertrophic changes are noted.  IMPRESSION: CT head:  Atrophic changes without acute abnormality.  CT of the cervical spine: Degenerative change without acute abnormality.   Electronically Signed   By: Inez Catalina M.D.   On: 08/28/2014 20:40   Ct Angio Neck W/cm &/or Wo/cm  08/29/2014   CLINICAL DATA:  NECK PAIN  EXAM: CT ANGIOGRAPHY NECK  TECHNIQUE: Multidetector CT imaging of the neck was performed using the standard protocol during bolus administration of intravenous contrast. Multiplanar CT image reconstructions and MIPs were obtained to evaluate the vascular anatomy. Carotid stenosis measurements (when applicable) are obtained utilizing NASCET criteria, using the distal internal carotid diameter as the denominator.  CONTRAST:  48m OMNIPAQUE IOHEXOL 350 MG/ML SOLN  COMPARISON:   Prior CT cervical spine from 08/28/2014  FINDINGS: The visualized aortic arch is of normal caliber with normal 3 vessel morphology. Scattered calcified atheromatous disease present within the aortic arch. No high-grade stenosis seen at the origin of the great vessels. Subclavian arteries are well opacified.  The common carotid arteries are symmetric in caliber without occlusion, dissection, or hemodynamically significant stenosis. Prominent calcified atheromatous disease present about the carotid bifurcations bilaterally.  Prominent calcified plaque with associated short segment stenosis of approximately 30-40% seen within the proximal right internal carotid artery (series 8, image 81). The right internal carotid artery is well opacified distally without evidence of dissection or other hemodynamically significant stenosis.  Calcified and noncalcified plaque with resultant relatively smooth stenosis of approximately 30-40% seen within the proximal left internal carotid artery (series 8, image 87). The left internal carotid arteries otherwise well opacified without evidence of dissection or other acute abnormality.  Both vertebral arteries arise from the subclavian arteries. Vascular calcifications seen at the origin of the vertebral arteries bilaterally. The right vertebral artery is dominant. No evidence of vertebral artery dissection or occlusion. No hemodynamically significant stenosis seen within the vertebral arteries bilaterally.  Visualized superior mediastinum within normal limits. Atelectatic changes noted within the visualized lungs. Thyroid gland within normal limits. No adenopathy within the neck.  No acute osseous abnormality. No worrisome lytic or blastic osseous lesions.  IMPRESSION: 1. No dissection or other acute abnormality identified within the major arterial vasculature of the neck. 2. Atheromatous disease within the proximal internal carotid arteries with associated stenoses of approximately  30-40% by NASCET criteria. No high-grade hemodynamically significant stenosis identified.   Electronically Signed   By: BJeannine BogaM.D.   On: 08/29/2014 03:35   Ct Angio Chest Pe W/cm &/or Wo Cm  08/31/2014   CLINICAL DATA:  Abnormal chest radiograph, evaluate for pulmonary hemorrhage or CHF  EXAM: CT ANGIOGRAPHY CHEST WITH CONTRAST  TECHNIQUE: Multidetector CT imaging of the chest was performed using the standard protocol during bolus administration of intravenous contrast. Multiplanar CT image reconstructions and MIPs were obtained to evaluate the vascular anatomy.  CONTRAST:  1022mOMNIPAQUE IOHEXOL 350 MG/ML SOLN  COMPARISON:  Chest radiograph dated 08/30/2014  FINDINGS: No evidence of pulmonary embolism.  Right lower lobe consolidation. Additional patchy opacities in the right upper and middle lobes. This appearance is compatible with multifocal pneumonia. Trace bilateral pleural effusions. No pneumothorax.  Visualized thyroid is unremarkable.  The heart is normal in size.  No pericardial effusion. Coronary atherosclerosis. Postsurgical changes related to prior CABG. Atherosclerotic calcifications of the aortic arch.  Small mediastinal and right hilar lymph nodes, likely reactive.  Visualized upper abdomen is notable for layering gallbladder sludge.  Degenerative changes of the visualized thoracolumbar spine.  Review of the MIP images confirms the above findings.  IMPRESSION: No evidence of pulmonary embolism.  Multifocal right lung pneumonia with right lower lobe consolidation.  Trace bilateral pleural effusions.   Electronically Signed   By: Julian Hy M.D.   On: 08/31/2014 09:33   Ct Cervical Spine Wo Contrast  08/28/2014   CLINICAL DATA:  Left-sided neck pain and headaches  EXAM: CT HEAD WITHOUT CONTRAST  CT CERVICAL SPINE WITHOUT CONTRAST  TECHNIQUE: Multidetector CT imaging of the head and cervical spine was performed following the standard protocol without intravenous contrast.  Multiplanar CT image reconstructions of the cervical spine were also generated.  COMPARISON:  06/27/2014  FINDINGS: CT HEAD FINDINGS  The bony calvarium is intact. Mild atrophic changes are noted. No findings to suggest acute hemorrhage, acute infarction or space-occupying mass lesion are noted.  CT CERVICAL SPINE FINDINGS  Seven cervical segments are well visualized. Mild osteophytic changes are noted. No acute fracture or acute facet abnormality is noted. The surrounding soft tissues are unremarkable. Mild facet hypertrophic changes are noted.  IMPRESSION: CT head:  Atrophic changes without acute abnormality.  CT of the cervical spine: Degenerative change without acute abnormality.   Electronically Signed   By: Inez Catalina M.D.   On: 08/28/2014 20:40   Dg Chest Port 1 View  09/02/2014   CLINICAL DATA:  Followup pneumonia, cough.  EXAM: PORTABLE CHEST - 1 VIEW  COMPARISON:  CT chest 08/31/2014 and chest radiograph 08/30/2014.  FINDINGS: Trachea is midline. Heart size stable. Patchy airspace disease in the right lung has improved slightly in the interval from 08/30/2014. Tiny bilateral effusions.  IMPRESSION: Improving aeration in the right lung with persistent areas of patchy airspace disease, suspicious for pneumonia.   Electronically Signed   By: Lorin Picket M.D.   On: 09/02/2014 12:06   Dg Chest Port 1 View  08/30/2014   CLINICAL DATA:  Pneumonia  EXAM: PORTABLE CHEST - 1 VIEW  COMPARISON:  08/30/2014 at 11:34 a.m.  FINDINGS: There has been interval development of right upper and right lower lobe airspace opacity with suggestion of right upper lobe air bronchogram formation. Evidence of CABG with cardiomegaly reidentified. AC joint degenerative change is reidentified.  IMPRESSION: Interval development of right upper and lower lobe airspace opacities most typical for asymmetric edema given the rapidity of development since today's earlier study. Hemorrhage or aspiration could appear similar. Pneumonia  may also present with this appearance, but does not usually evolve so rapidly.   Electronically Signed   By: Conchita Paris M.D.   On: 08/30/2014 19:17   Dg Chest Portable 1 View  08/28/2014   CLINICAL DATA:  NECK PAIN NECK PAIN  EXAM: PORTABLE CHEST - 1 VIEW  COMPARISON:  06/27/2014  FINDINGS: Previous CABG. Heart size upper limits normal. Mild diffuse interstitial edema or infiltrates, new increased since previous exam. No effusion.  IMPRESSION: 1. Mild bilateral interstitial edema or infiltrates, new since previous exam.   Electronically Signed   By: Arne Cleveland M.D.   On: 08/28/2014 23:39    Microbiology: Recent Results (from the past 240 hour(s))  CSF CULTURE     Status: None   Collection Time    08/28/14 11:00 PM  Result Value Ref Range Status   Specimen Description CSF   Final   Special Requests NONE   Final   Gram Stain     Final   Value: NO WBC SEEN     NO ORGANISMS SEEN     CYTOSPIN SLIDE Performed at [facility]     Performed at [facility]   Culture     Final   Value: NO GROWTH 3 DAYS     Performed at [facility]   Report Status 09/01/2014 FINAL   Final  CULTURE, BLOOD (ROUTINE X 2)     Status: None   Collection Time    08/30/14 12:09 PM      Result Value Ref Range Status   Specimen Description BLOOD LEFT ARM   Final   Special Requests BOTTLES DRAWN AEROBIC AND ANAEROBIC 10CC   Final   Culture NO GROWTH 2 DAYS   Final   Report Status PENDING   Incomplete  MRSA PCR SCREENING     Status: None   Collection Time    08/30/14  2:30 PM      Result Value Ref Range Status   MRSA by PCR NEGATIVE  NEGATIVE Final   Comment:            The GeneXpert MRSA Assay (FDA     approved for NASAL specimens     only), is one component of a     comprehensive MRSA colonization     surveillance program. It is not     intended to diagnose MRSA     infection nor to guide or     monitor treatment for     MRSA infections.  CULTURE, BLOOD (ROUTINE X 2)      Status: None   Collection Time    08/30/14  2:36 PM      Result Value Ref Range Status   Specimen Description BLOOD RIGHT FOREARM   Final   Special Requests BOTTLES DRAWN AEROBIC AND ANAEROBIC 6CC   Final   Culture NO GROWTH 2 DAYS   Final   Report Status PENDING   Incomplete  CULTURE, RESPIRATORY (NON-EXPECTORATED)     Status: None   Collection Time    08/31/14 10:36 PM      Result Value Ref Range Status   Specimen Description SPUTUM   Final   Special Requests NONE   Final   Gram Stain PENDING   Incomplete   Culture     Final   Value: NO GROWTH 1 DAY     Performed at [facility]   Report Status PENDING   Incomplete     Labs: Basic Metabolic Panel:  Recent Labs Lab 08/28/14 2112 08/30/14 1107 08/31/14 0522 09/01/14 0517 09/02/14 0516  NA 139 139 140 140 140  K 4.3 4.3 3.9 3.6* 4.1  CL 96 97 100 102 100  CO2 33* 35* 32 33* 31  GLUCOSE 114* 143* 157* 101* 101*  BUN '9 20 23 15 11  ' CREATININE 0.70 0.86 0.81 0.54 0.55  CALCIUM 9.4 8.9 8.1* 8.2* 8.3*   Liver Function Tests:  Recent Labs Lab 08/28/14 2112 08/30/14 1107 08/31/14 0522 09/01/14 0517  AST '21 14 14 17  ' ALT '14 11 9 10  ' ALKPHOS 62 45 33* 34*  BILITOT 1.1 1.2 1.0 0.6  PROT 7.7 6.7 5.6* 5.5*  ALBUMIN 4.1 3.3* 2.6* 2.5*   No results found for this basename: LIPASE, AMYLASE,  in the last 168 hours  No results found for this basename: AMMONIA,  in the last 168 hours CBC:  Recent Labs Lab 08/28/14 2112 08/30/14 1107 08/31/14 0522 09/01/14 0517 09/02/14 0516  WBC 9.9 9.2 15.0* 11.1* 8.5  NEUTROABS 7.8* 6.9 13.3* 8.8* 6.5  HGB 13.7 12.3* 11.1* 10.2* 10.9*  HCT 40.7 37.2* 33.3* 31.0* 33.1*  MCV 97.1 97.6 97.1 96.6 96.5  PLT 208 183 174 173 192   Cardiac Enzymes:  Recent Labs Lab 08/30/14 1107  TROPONINI <0.30   BNP: BNP (last 3 results)  Recent Labs  08/30/14 1200  PROBNP 1378.0*   CBG: No results found for this basename: GLUCAP,  in the last 168  hours     Signed:  Nita Sells  [name] 09/03/2014, 10:05 AM

## 2014-09-03 NOTE — Progress Notes (Signed)
Patient and patient's brother received discharge instructions and scripts.  Patient and brother had questions about when to take what medicines once they were home.  Both were educated on when to take what medications and times to take medicines were all written on discharge instructions.  Patient and brother had no other questions/concerns.  Patient's IVs were removed and were clean, dry, and intact at removal. Patient was escorted to vehicle with brother via wheelchair by patient advocate.

## 2014-09-04 ENCOUNTER — Other Ambulatory Visit: Payer: Self-pay | Admitting: Cardiology

## 2014-09-04 LAB — CULTURE, BLOOD (ROUTINE X 2)
Culture: NO GROWTH
Culture: NO GROWTH

## 2014-09-04 LAB — CULTURE, RESPIRATORY W GRAM STAIN

## 2014-09-04 LAB — CULTURE, RESPIRATORY: Culture: NO GROWTH

## 2014-09-04 NOTE — Telephone Encounter (Signed)
Medication sent to pharmacy  

## 2014-10-25 ENCOUNTER — Other Ambulatory Visit: Payer: Self-pay | Admitting: Cardiology

## 2015-01-13 ENCOUNTER — Other Ambulatory Visit: Payer: Self-pay | Admitting: Cardiology

## 2015-01-26 ENCOUNTER — Encounter (HOSPITAL_COMMUNITY): Payer: Self-pay | Admitting: Emergency Medicine

## 2015-01-26 ENCOUNTER — Emergency Department (HOSPITAL_COMMUNITY): Payer: PPO

## 2015-01-26 ENCOUNTER — Inpatient Hospital Stay (HOSPITAL_COMMUNITY)
Admission: EM | Admit: 2015-01-26 | Discharge: 2015-01-29 | DRG: 195 | Disposition: A | Discharge status: Home / Self Care | Payer: PPO | Attending: Internal Medicine | Admitting: Internal Medicine

## 2015-01-26 DIAGNOSIS — I252 Old myocardial infarction: Secondary | ICD-10-CM

## 2015-01-26 DIAGNOSIS — F419 Anxiety disorder, unspecified: Secondary | ICD-10-CM | POA: Diagnosis present

## 2015-01-26 DIAGNOSIS — I2510 Atherosclerotic heart disease of native coronary artery without angina pectoris: Secondary | ICD-10-CM | POA: Diagnosis present

## 2015-01-26 DIAGNOSIS — E039 Hypothyroidism, unspecified: Secondary | ICD-10-CM | POA: Diagnosis present

## 2015-01-26 DIAGNOSIS — E785 Hyperlipidemia, unspecified: Secondary | ICD-10-CM | POA: Diagnosis present

## 2015-01-26 DIAGNOSIS — J189 Pneumonia, unspecified organism: Principal | ICD-10-CM | POA: Diagnosis present

## 2015-01-26 DIAGNOSIS — Z87891 Personal history of nicotine dependence: Secondary | ICD-10-CM

## 2015-01-26 DIAGNOSIS — F0280 Dementia in other diseases classified elsewhere without behavioral disturbance: Secondary | ICD-10-CM | POA: Diagnosis present

## 2015-01-26 DIAGNOSIS — G309 Alzheimer's disease, unspecified: Secondary | ICD-10-CM | POA: Diagnosis present

## 2015-01-26 DIAGNOSIS — I10 Essential (primary) hypertension: Secondary | ICD-10-CM | POA: Diagnosis present

## 2015-01-26 DIAGNOSIS — R0902 Hypoxemia: Secondary | ICD-10-CM | POA: Diagnosis present

## 2015-01-26 DIAGNOSIS — Z951 Presence of aortocoronary bypass graft: Secondary | ICD-10-CM

## 2015-01-26 LAB — CBC WITH DIFFERENTIAL/PLATELET
BASOS ABS: 0 10*3/uL (ref 0.0–0.1)
Basophils Relative: 0 % (ref 0–1)
EOS ABS: 0.2 10*3/uL (ref 0.0–0.7)
EOS PCT: 1 % (ref 0–5)
HCT: 41.6 % (ref 39.0–52.0)
HEMOGLOBIN: 13.6 g/dL (ref 13.0–17.0)
LYMPHS PCT: 6 % — AB (ref 12–46)
Lymphs Abs: 1 10*3/uL (ref 0.7–4.0)
MCH: 30.6 pg (ref 26.0–34.0)
MCHC: 32.7 g/dL (ref 30.0–36.0)
MCV: 93.5 fL (ref 78.0–100.0)
Monocytes Absolute: 0.7 10*3/uL (ref 0.1–1.0)
Monocytes Relative: 5 % (ref 3–12)
NEUTROS PCT: 88 % — AB (ref 43–77)
Neutro Abs: 13.8 10*3/uL — ABNORMAL HIGH (ref 1.7–7.7)
Platelets: 235 10*3/uL (ref 150–400)
RBC: 4.45 MIL/uL (ref 4.22–5.81)
RDW: 13.5 % (ref 11.5–15.5)
WBC: 15.7 10*3/uL — ABNORMAL HIGH (ref 4.0–10.5)

## 2015-01-26 LAB — URINALYSIS, ROUTINE W REFLEX MICROSCOPIC
Bilirubin Urine: NEGATIVE
GLUCOSE, UA: NEGATIVE mg/dL
KETONES UR: NEGATIVE mg/dL
LEUKOCYTES UA: NEGATIVE
NITRITE: NEGATIVE
PH: 5.5 (ref 5.0–8.0)
PROTEIN: NEGATIVE mg/dL
UROBILINOGEN UA: 0.2 mg/dL (ref 0.0–1.0)

## 2015-01-26 LAB — COMPREHENSIVE METABOLIC PANEL
ALBUMIN: 4.8 g/dL (ref 3.5–5.2)
ALT: 19 U/L (ref 0–53)
AST: 22 U/L (ref 0–37)
Alkaline Phosphatase: 61 U/L (ref 39–117)
Anion gap: 7 (ref 5–15)
BILIRUBIN TOTAL: 1.1 mg/dL (ref 0.3–1.2)
BUN: 23 mg/dL (ref 6–23)
CALCIUM: 9.4 mg/dL (ref 8.4–10.5)
CO2: 31 mmol/L (ref 19–32)
Chloride: 100 mmol/L (ref 96–112)
Creatinine, Ser: 0.85 mg/dL (ref 0.50–1.35)
GFR calc non Af Amer: 81 mL/min — ABNORMAL LOW (ref >90)
Glucose, Bld: 133 mg/dL — ABNORMAL HIGH (ref 70–99)
Potassium: 4.2 mmol/L (ref 3.5–5.1)
Sodium: 138 mmol/L (ref 135–145)
Total Protein: 8.4 g/dL — ABNORMAL HIGH (ref 6.0–8.3)

## 2015-01-26 LAB — URINE MICROSCOPIC-ADD ON

## 2015-01-26 LAB — LACTIC ACID, PLASMA: Lactic Acid, Venous: 1.5 mmol/L (ref 0.5–2.0)

## 2015-01-26 LAB — TROPONIN I

## 2015-01-26 MED ORDER — ACETAMINOPHEN 500 MG PO TABS
1000.0000 mg | ORAL_TABLET | Freq: Once | ORAL | Status: AC
  Administered 2015-01-26: 1000 mg via ORAL
  Filled 2015-01-26: qty 2

## 2015-01-26 MED ORDER — SODIUM CHLORIDE 0.9 % IV BOLUS (SEPSIS)
1000.0000 mL | Freq: Once | INTRAVENOUS | Status: AC
  Administered 2015-01-26: 1000 mL via INTRAVENOUS

## 2015-01-26 NOTE — ED Notes (Signed)
Baseline demetia, wife called [facility] due to pt more altered and having fever and chills.

## 2015-01-26 NOTE — ED Notes (Signed)
Patient has dementia and not able to answer appropriately.

## 2015-01-26 NOTE — ED Notes (Signed)
Pt states he came to the ED for a hair cut.

## 2015-01-27 ENCOUNTER — Encounter (HOSPITAL_COMMUNITY): Payer: Self-pay

## 2015-01-27 DIAGNOSIS — F0280 Dementia in other diseases classified elsewhere without behavioral disturbance: Secondary | ICD-10-CM | POA: Diagnosis present

## 2015-01-27 DIAGNOSIS — G309 Alzheimer's disease, unspecified: Secondary | ICD-10-CM | POA: Diagnosis present

## 2015-01-27 DIAGNOSIS — R0902 Hypoxemia: Secondary | ICD-10-CM | POA: Diagnosis present

## 2015-01-27 DIAGNOSIS — E039 Hypothyroidism, unspecified: Secondary | ICD-10-CM | POA: Diagnosis present

## 2015-01-27 DIAGNOSIS — J189 Pneumonia, unspecified organism: Secondary | ICD-10-CM | POA: Diagnosis present

## 2015-01-27 DIAGNOSIS — I2510 Atherosclerotic heart disease of native coronary artery without angina pectoris: Secondary | ICD-10-CM

## 2015-01-27 DIAGNOSIS — I10 Essential (primary) hypertension: Secondary | ICD-10-CM | POA: Diagnosis present

## 2015-01-27 DIAGNOSIS — F419 Anxiety disorder, unspecified: Secondary | ICD-10-CM | POA: Diagnosis present

## 2015-01-27 DIAGNOSIS — E785 Hyperlipidemia, unspecified: Secondary | ICD-10-CM

## 2015-01-27 DIAGNOSIS — Z87891 Personal history of nicotine dependence: Secondary | ICD-10-CM | POA: Diagnosis not present

## 2015-01-27 DIAGNOSIS — Z951 Presence of aortocoronary bypass graft: Secondary | ICD-10-CM | POA: Diagnosis not present

## 2015-01-27 DIAGNOSIS — I252 Old myocardial infarction: Secondary | ICD-10-CM | POA: Diagnosis not present

## 2015-01-27 LAB — CBC
HCT: 33.7 % — ABNORMAL LOW (ref 39.0–52.0)
Hemoglobin: 11 g/dL — ABNORMAL LOW (ref 13.0–17.0)
MCH: 30.5 pg (ref 26.0–34.0)
MCHC: 32.6 g/dL (ref 30.0–36.0)
MCV: 93.4 fL (ref 78.0–100.0)
Platelets: 192 10*3/uL (ref 150–400)
RBC: 3.61 MIL/uL — AB (ref 4.22–5.81)
RDW: 13.6 % (ref 11.5–15.5)
WBC: 15.3 10*3/uL — ABNORMAL HIGH (ref 4.0–10.5)

## 2015-01-27 LAB — BASIC METABOLIC PANEL
Anion gap: 7 (ref 5–15)
BUN: 25 mg/dL — ABNORMAL HIGH (ref 6–23)
CALCIUM: 8.6 mg/dL (ref 8.4–10.5)
CO2: 31 mmol/L (ref 19–32)
CREATININE: 0.84 mg/dL (ref 0.50–1.35)
Chloride: 101 mmol/L (ref 96–112)
GFR calc Af Amer: >90 mL/min (ref >90)
GFR calc non Af Amer: 82 mL/min — ABNORMAL LOW (ref >90)
Glucose, Bld: 118 mg/dL — ABNORMAL HIGH (ref 70–99)
Potassium: 3.8 mmol/L (ref 3.5–5.1)
Sodium: 139 mmol/L (ref 135–145)

## 2015-01-27 MED ORDER — ACETAMINOPHEN 325 MG PO TABS: 650.0000 mg | ORAL_TABLET | Freq: Four times a day (QID) | ORAL | Status: DC | PRN

## 2015-01-27 MED ORDER — ACETAMINOPHEN 650 MG RE SUPP: 650.0000 mg | Freq: Four times a day (QID) | RECTAL | Status: DC | PRN

## 2015-01-27 MED ORDER — FENTANYL 100 MCG/HR TD PT72
100.0000 ug | MEDICATED_PATCH | TRANSDERMAL | Status: DC
  Administered 2015-01-27: 100 ug via TRANSDERMAL

## 2015-01-27 MED ORDER — ONDANSETRON HCL 4 MG/2ML IJ SOLN: 4.0000 mg | Freq: Four times a day (QID) | INTRAMUSCULAR | Status: DC | PRN

## 2015-01-27 MED ORDER — GABAPENTIN 100 MG PO CAPS
100.0000 mg | ORAL_CAPSULE | Freq: Every day | ORAL | Status: DC
  Administered 2015-01-27 – 2015-01-29 (×3): 100 mg via ORAL
  Filled 2015-01-27 (×3): qty 1

## 2015-01-27 MED ORDER — DONEPEZIL HCL 5 MG PO TABS
10.0000 mg | ORAL_TABLET | Freq: Every day | ORAL | Status: DC
  Administered 2015-01-27 – 2015-01-28 (×2): 10 mg via ORAL
  Filled 2015-01-27 (×3): qty 2

## 2015-01-27 MED ORDER — OXYCODONE HCL 5 MG PO TABS
5.0000 mg | ORAL_TABLET | ORAL | Status: DC | PRN
  Administered 2015-01-27 – 2015-01-29 (×5): 5 mg via ORAL
  Filled 2015-01-27 (×2): qty 1

## 2015-01-27 MED ORDER — CLOPIDOGREL BISULFATE 75 MG PO TABS
75.0000 mg | ORAL_TABLET | Freq: Every day | ORAL | Status: DC
  Administered 2015-01-27 – 2015-01-29 (×3): 75 mg via ORAL
  Filled 2015-01-27 (×3): qty 1

## 2015-01-27 MED ORDER — ENOXAPARIN SODIUM 30 MG/0.3ML ~~LOC~~ SOLN
30.0000 mg | SUBCUTANEOUS | Status: DC
  Administered 2015-01-27: 30 mg via SUBCUTANEOUS
  Filled 2015-01-27: qty 0.3

## 2015-01-27 MED ORDER — HYDROMORPHONE HCL 1 MG/ML IJ SOLN: 0.5000 mg | INTRAMUSCULAR | Status: DC | PRN

## 2015-01-27 MED ORDER — TRAZODONE HCL 50 MG PO TABS
50.0000 mg | ORAL_TABLET | Freq: Every day | ORAL | Status: DC
  Administered 2015-01-27 – 2015-01-28 (×2): 50 mg via ORAL
  Filled 2015-01-27 (×2): qty 1

## 2015-01-27 MED ORDER — CARVEDILOL 3.125 MG PO TABS
3.1250 mg | ORAL_TABLET | Freq: Two times a day (BID) | ORAL | Status: DC
  Administered 2015-01-29: 3.125 mg via ORAL
  Filled 2015-01-27 (×2): qty 1

## 2015-01-27 MED ORDER — LEVOFLOXACIN IN D5W 500 MG/100ML IV SOLN
500.0000 mg | INTRAVENOUS | Status: DC
  Administered 2015-01-28 – 2015-01-29 (×2): 500 mg via INTRAVENOUS
  Filled 2015-01-27 (×2): qty 100

## 2015-01-27 MED ORDER — SODIUM CHLORIDE 0.9 % IV SOLN
INTRAVENOUS | Status: DC
  Administered 2015-01-27: 03:00:00 via INTRAVENOUS
  Administered 2015-01-28: 1 mL via INTRAVENOUS
  Administered 2015-01-28: 19:00:00 via INTRAVENOUS

## 2015-01-27 MED ORDER — NITROGLYCERIN 0.4 MG SL SUBL: 0.4000 mg | SUBLINGUAL_TABLET | SUBLINGUAL | Status: DC | PRN

## 2015-01-27 MED ORDER — SORBITOL 70 % SOLN
20.0000 mL | Freq: Two times a day (BID) | Status: DC
  Administered 2015-01-27 – 2015-01-29 (×5): 20 mL via ORAL
  Filled 2015-01-27 (×5): qty 30

## 2015-01-27 MED ORDER — LEVOTHYROXINE SODIUM 25 MCG PO TABS
25.0000 ug | ORAL_TABLET | Freq: Every day | ORAL | Status: DC
  Administered 2015-01-27 – 2015-01-29 (×3): 25 ug via ORAL
  Filled 2015-01-27 (×3): qty 1

## 2015-01-27 MED ORDER — ALUM & MAG HYDROXIDE-SIMETH 200-200-20 MG/5ML PO SUSP: 30.0000 mL | Freq: Four times a day (QID) | ORAL | Status: DC | PRN

## 2015-01-27 MED ORDER — SODIUM CHLORIDE 0.9 % IV SOLN
INTRAVENOUS | Status: DC
  Administered 2015-01-27 (×2): via INTRAVENOUS

## 2015-01-27 MED ORDER — LEVOFLOXACIN IN D5W 750 MG/150ML IV SOLN
750.0000 mg | Freq: Once | INTRAVENOUS | Status: AC
  Administered 2015-01-27: 750 mg via INTRAVENOUS
  Filled 2015-01-27: qty 150

## 2015-01-27 MED ORDER — ONDANSETRON HCL 4 MG PO TABS: 4.0000 mg | ORAL_TABLET | Freq: Four times a day (QID) | ORAL | Status: DC | PRN

## 2015-01-27 MED ORDER — MEMANTINE HCL 10 MG PO TABS
5.0000 mg | ORAL_TABLET | Freq: Every day | ORAL | Status: DC
  Administered 2015-01-27 – 2015-01-29 (×3): 5 mg via ORAL
  Filled 2015-01-27 (×3): qty 1

## 2015-01-27 MED ORDER — PRAVASTATIN SODIUM 40 MG PO TABS
40.0000 mg | ORAL_TABLET | Freq: Every day | ORAL | Status: DC
  Administered 2015-01-27 – 2015-01-28 (×2): 40 mg via ORAL
  Filled 2015-01-27 (×2): qty 1

## 2015-01-27 MED ORDER — ENOXAPARIN SODIUM 40 MG/0.4ML ~~LOC~~ SOLN
40.0000 mg | SUBCUTANEOUS | Status: DC
  Administered 2015-01-28 – 2015-01-29 (×2): 40 mg via SUBCUTANEOUS
  Filled 2015-01-27 (×2): qty 0.4

## 2015-01-27 MED ORDER — ALPRAZOLAM 1 MG PO TABS
1.0000 mg | ORAL_TABLET | Freq: Every day | ORAL | Status: DC | PRN
  Administered 2015-01-27 (×2): 1 mg via ORAL
  Filled 2015-01-27 (×2): qty 1

## 2015-01-27 MED ORDER — OXYCODONE HCL 5 MG PO TABS
5.0000 mg | ORAL_TABLET | ORAL | Status: DC | PRN
  Administered 2015-01-27: 5 mg via ORAL
  Filled 2015-01-27 (×4): qty 1

## 2015-01-27 MED ORDER — ESCITALOPRAM OXALATE 10 MG PO TABS
10.0000 mg | ORAL_TABLET | Freq: Every day | ORAL | Status: DC
  Administered 2015-01-27 – 2015-01-29 (×3): 10 mg via ORAL
  Filled 2015-01-27 (×3): qty 1

## 2015-01-27 NOTE — Progress Notes (Signed)
Patient's wife in room, stated patient takes Trazodone and Xanax both at bedtime.  Will continue to monitor patient's respirations.  Wife at bedside.

## 2015-01-27 NOTE — ED Provider Notes (Signed)
CSN: [id number]     Arrival date & time 01/26/15  2204 History   First MD Initiated Contact with Patient 01/26/15 2303     Chief Complaint  Patient presents with  . Chills     (Consider location/radiation/quality/duration/timing/severity/associated sxs/prior Treatment) HPI  Patient was sent to the emergency department by himself. However when I went to interview him he was unable to give any type history. His wife was called and she came to the ED about 12:30 AM. She states yesterday patient said something that he thought he might have pneumonia again. She states he has a chronic cough that has not changed. She states today he felt bad "hurting all over" but didn't want to miss going to church. She states he has dementia and he seems to get more confused in the evening. She states however tonight he seemed more confused than usual. She states she checked his oxygen level tonight at home and it was only 84% on room air. She denies that he had blue lips or cyanotic lips. She reports that he has chronic constipation and has to take a lot of laxatives to have a bowel movement. She also has noted recently he is having difficulty urinating and it can take him sometimes 10-15 minutes to start to urinate. He has not had a flu shot because he feels it makes him get ill. She reports patient was driving about 6 months ago but he is not allowed to drive now.  She reports patient had pneumonia a couple months ago.  PCP Dr Gerarda Fraction  Past Medical History  Diagnosis Date  . Arthritis   . Coronary atherosclerosis of native coronary artery     a. Multivessel s/p CABG 1996. b. s/p DES x 2 SVG to PDA 8/12 with distal disease managed medically.  . Mixed hyperlipidemia   . Essential hypertension, benign   . MI (myocardial infarction)   . Hematuria   . OSA (obstructive sleep apnea)   . Anxiety   . OA (osteoarthritis)   . DDD (degenerative disc disease)     Chronic back pain  . Alzheimer disease   . Enlarged  prostate   . Hypothyroidism   . Atrophic gastritis     a. By EGD 02/2013.   Past Surgical History  Procedure Laterality Date  . Coronary artery bypass graft  1996    LIMA to LAD, SVG to D2, SVG to PDA, SVG to OM1 and OM2  . Hernia repair    . Coronary angioplasty with stent placement    . Colonoscopy  08/03/2004    Jenkins-numerous large diverticula in the descending, transverse, descending, and sigmoid colon. Otherwise normal exam.  . Colonoscopy  07/12/2012    RMR: External hemorrhoidal tag; multiple rectal and colonic polyps removed and/or treated as described above. Pancolonic diverticulosis. Bx-tubular adenomas, rectal hyperplastic polyp. next colonoscopy in 06/2015.  [name] Esophagogastroduodenoscopy N/A 03/16/2013    Procedure: ESOPHAGOGASTRODUODENOSCOPY (EGD);  Surgeon: Daneil Dolin, MD;  Location: [facility];  Service: Endoscopy;  Laterality: N/A;  12:00-moved to [place] notified pt  . Esophagogastroduodenoscopy N/A 03/06/2013    Procedure: ESOPHAGOGASTRODUODENOSCOPY (EGD);  Surgeon: Daneil Dolin, MD;  Location: [facility];  Service: Endoscopy;  Laterality: N/A;   Family History  Problem Relation Age of Onset  . Heart disease    . Colon cancer Neg Hx    History  Substance Use Topics  . Smoking status: Former Smoker -- 2.00 packs/day for 40 years  Types: Cigarettes    Quit date: 12/27/1994  . Smokeless tobacco: Never Used  . Alcohol Use: No  pt lives at home Pt lives with spouse  Review of Systems  Unable to perform ROS: Dementia      Allergies  Review of patient's allergies indicates no known allergies.  Home Medications   Prior to Admission medications   Medication Sig Start Date End Date Taking? Authorizing Provider  ALPRAZolam Duanne Moron) 1 MG tablet Take 1 tablet (1 mg total) by mouth daily as needed. For anxiety 09/03/14   Nita Sells, MD  carvedilol (COREG) 3.125 MG tablet Take 1 tablet by mouth 2 (two) times daily. 08/06/14   Historical  Provider, MD  clopidogrel (PLAVIX) 75 MG tablet Take 75 mg by mouth daily.     Historical Provider, MD  donepezil (ARICEPT) 10 MG tablet Take 1 tablet by mouth At bedtime. 07/01/12   Historical Provider, MD  escitalopram (LEXAPRO) 10 MG tablet Take 10 mg by mouth daily.    Historical Provider, MD  fentaNYL (DURAGESIC - DOSED MCG/HR) 100 MCG/HR Place 1 patch (100 mcg total) onto the skin every 3 (three) days. 08/31/14   Tiffany L Reed, DO  gabapentin (NEURONTIN) 100 MG capsule Take 100 mg by mouth daily.    Historical Provider, MD  levofloxacin (LEVAQUIN) 500 MG tablet Take 1 tablet (500 mg total) by mouth daily. 09/03/14   Nita Sells, MD  levothyroxine (SYNTHROID, LEVOTHROID) 25 MCG tablet Take 25 mcg by mouth daily.  06/19/12   Historical Provider, MD  NAMENDA 5 MG tablet Take 5 mg by mouth daily.  07/23/11   Historical Provider, MD  NITROSTAT 0.4 MG SL tablet PLACE ONE TABLET UNDER TONGUE EVERY 5 MIN UP TO 3 DOSES AS NEEDED FORCHEST PAIN. 10/25/14   Satira Sark, MD  oxyCODONE (OXY IR/ROXICODONE) 5 MG immediate release tablet Take 1 tablet (5 mg total) by mouth every 4 (four) hours as needed for moderate pain. 09/03/14   Nita Sells, MD  pravastatin (PRAVACHOL) 40 MG tablet TAKE (1) TABLET BY MOUTH AT BEDTIME. 01/13/15   Lendon Colonel, NP  sorbitol 70 % SOLN Take 20 mLs by mouth 2 (two) times daily. 09/03/14   Nita Sells, MD  traZODone (DESYREL) 50 MG tablet Take 50 mg by mouth at bedtime.  06/06/14   Historical Provider, MD   ED Triage Vitals  Enc Vitals Group     BP 01/26/15 2211 140/59 mmHg     Pulse Rate 01/26/15 2211 68     Resp 01/26/15 2211 20     Temp 01/26/15 2211 101.3 F (38.5 C)     Temp Source 01/26/15 2211 Oral     SpO2 01/26/15 2211 89 %     Weight --      Height --      Head Cir --      Peak Flow --      Pain Score --      Pain Loc --      Pain Edu? --      Excl. in [place]? --     Vital signs normal except for fever and hypoxia   Physical Exam   Constitutional: He appears well-developed and well-nourished.  Non-toxic appearance. He does not appear ill. No distress.  HENT:  Head: Normocephalic and atraumatic.  Right Ear: External ear normal.  Left Ear: External ear normal.  Nose: Nose normal. No mucosal edema or rhinorrhea.  Mouth/Throat: Oropharynx is clear and moist and mucous membranes  are normal. No dental abscesses or uvula swelling.  Eyes: Conjunctivae and EOM are normal. Pupils are equal, round, and reactive to light.  Neck: Normal range of motion and full passive range of motion without pain. Neck supple.  Cardiovascular: Normal rate, regular rhythm and normal heart sounds.  Exam reveals no gallop and no friction rub.   No murmur heard. Pulmonary/Chest: Effort normal and breath sounds normal. No respiratory distress. He has no wheezes. He has no rhonchi. He has no rales. He exhibits no tenderness and no crepitus.  No coughing was noted during my interview  Abdominal: Soft. Normal appearance and bowel sounds are normal. He exhibits no distension. There is no tenderness. There is no rebound and no guarding.  Musculoskeletal: Normal range of motion. He exhibits no edema or tenderness.  Moves all extremities well. Patient's right lower extremity is larger than the left, he appears to have had a vein graft taken from the right leg. The right leg however does not have redness or warmth.  Neurological: He is alert. He has normal strength. No cranial nerve deficit.  Skin: Skin is warm, dry and intact. No rash noted. No erythema. No pallor.  Psychiatric: He has a normal mood and affect. His behavior is normal. His mood appears not anxious.  Patient is constantly talking, at most times I cannot understand any speech. If I ask him a specific questioning he starts talking about something unrelated.  Nursing note and vitals reviewed.   ED Course  Procedures (including critical care time)  Medications  levofloxacin (LEVAQUIN) IVPB 750  mg (not administered)  acetaminophen (TYLENOL) tablet 1,000 mg (1,000 mg Oral Given 01/26/15 2338)  sodium chloride 0.9 % bolus 1,000 mL (0 mLs Intravenous Stopped 01/27/15 0030)    Review of patient's chart shows he was last admitted in September with pneumonia. He has some hypoxia at that time and was on oxygen while in the hospital. He was [facility] to the [facility] from the hospital. He was treated with Levaquin.  Patient was started on IV Levaquin in the ED. He was given IV fluids. His fever was treated with Tylenol. Patient was placed on oxygen 2 L/m nasal cannula for his hypoxia.  Bladder scan was ordered to evaluate his difficulty urinating.  01:25 Dr Gasper Lloyd, admit to [facility] Review Results for orders placed or performed during the hospital encounter of 01/26/15  Culture, blood (routine x 2)  Result Value Ref Range   Specimen Description BLOOD RIGHT ARM    Special Requests BOTTLES DRAWN AEROBIC AND ANAEROBIC 8CC    Culture PENDING    Report Status PENDING   Culture, blood (routine x 2)  Result Value Ref Range   Specimen Description BLOOD RIGHT HAND    Special Requests BOTTLES DRAWN AEROBIC AND ANAEROBIC 6CC    Culture PENDING    Report Status PENDING   Comprehensive metabolic panel  Result Value Ref Range   Sodium 138 135 - 145 mmol/L   Potassium 4.2 3.5 - 5.1 mmol/L   Chloride 100 96 - 112 mmol/L   CO2 31 19 - 32 mmol/L   Glucose, Bld 133 (H) 70 - 99 mg/dL   BUN 23 6 - 23 mg/dL   Creatinine, Ser 0.85 0.50 - 1.35 mg/dL   Calcium 9.4 8.4 - 10.5 mg/dL   Total Protein 8.4 (H) 6.0 - 8.3 g/dL   Albumin 4.8 3.5 - 5.2 g/dL   AST 22 0 - 37 U/L   ALT 19 0 -  53 U/L   Alkaline Phosphatase 61 39 - 117 U/L   Total Bilirubin 1.1 0.3 - 1.2 mg/dL   GFR calc non Af Amer 81 (L) >90 mL/min   GFR calc Af Amer >90 >90 mL/min   Anion gap 7 5 - 15  Troponin I  Result Value Ref Range   Troponin I <0.03 <0.031 ng/mL  CBC with Differential  Result Value Ref Range   WBC  15.7 (H) 4.0 - 10.5 K/uL   RBC 4.45 4.22 - 5.81 MIL/uL   Hemoglobin 13.6 13.0 - 17.0 g/dL   HCT 41.6 39.0 - 52.0 %   MCV 93.5 78.0 - 100.0 fL   MCH 30.6 26.0 - 34.0 pg   MCHC 32.7 30.0 - 36.0 g/dL   RDW 13.5 11.5 - 15.5 %   Platelets 235 150 - 400 K/uL   Neutrophils Relative % 88 (H) 43 - 77 %   Neutro Abs 13.8 (H) 1.7 - 7.7 K/uL   Lymphocytes Relative 6 (L) 12 - 46 %   Lymphs Abs 1.0 0.7 - 4.0 K/uL   Monocytes Relative 5 3 - 12 %   Monocytes Absolute 0.7 0.1 - 1.0 K/uL   Eosinophils Relative 1 0 - 5 %   Eosinophils Absolute 0.2 0.0 - 0.7 K/uL   Basophils Relative 0 0 - 1 %   Basophils Absolute 0.0 0.0 - 0.1 K/uL  Lactic acid, plasma  Result Value Ref Range   Lactic Acid, Venous 1.5 0.5 - 2.0 mmol/L  Urinalysis, Routine w reflex microscopic  Result Value Ref Range   Color, Urine YELLOW YELLOW   APPearance CLEAR CLEAR   Specific Gravity, Urine >1.030 (H) 1.005 - 1.030   pH 5.5 5.0 - 8.0   Glucose, UA NEGATIVE NEGATIVE mg/dL   Hgb urine dipstick SMALL (A) NEGATIVE   Bilirubin Urine NEGATIVE NEGATIVE   Ketones, ur NEGATIVE NEGATIVE mg/dL   Protein, ur NEGATIVE NEGATIVE mg/dL   Urobilinogen, UA 0.2 0.0 - 1.0 mg/dL   Nitrite NEGATIVE NEGATIVE   Leukocytes, UA NEGATIVE NEGATIVE  Urine microscopic-add on  Result Value Ref Range   Squamous Epithelial / LPF RARE RARE   WBC, UA 0-2 <3 WBC/hpf   RBC / HPF 0-2 <3 RBC/hpf   Bacteria, UA RARE RARE   Laboratory interpretation all normal except concentrated urine, leukocytosis     Imaging Review Dg Chest 2 View  01/27/2015   CLINICAL DATA:  Altered mental status, worsening from baseline dementia. Shortness of breath, weakness.  EXAM: CHEST  2 VIEW  COMPARISON:  Chest radiograph September 02, 2014  FINDINGS: The cardiac silhouette is moderately enlarged, unchanged. Status post median sternotomy for CABG. Patchy LEFT greater than RIGHT lower lobe airspace opacities. No pleural effusion. No pneumothorax. Patient is osteopenic. Mild  degenerative change of the thoracic spine.  IMPRESSION: Patchy LEFT greater than RIGHT lung base airspace opacities could reflect pneumonia.  Stable cardiomegaly.  Mild bronchitic changes.   Electronically Signed   By: Elon Alas   On: 01/27/2015 00:01     EKG Interpretation   Date/Time:  Sunday January 26 2015 22:40:43 EST Ventricular Rate:  74 PR Interval:  71 QRS Duration: 130 QT Interval:  414 QTC Calculation: 459 R Axis:   4 Text Interpretation:  Sinus rhythm Left bundle branch block Artifact in  lead(s) I II III aVR aVL aVF No significant change since last tracing 30 Aug 2014 Confirmed by [facility]  MD-I, Everlyn Farabaugh ([id number]) on 01/26/2015 11:02:45 PM  MDM   Final diagnoses:  CAP (community acquired pneumonia)  Hypoxia    Plan admission   Rolland Porter, MD, Alanson Aly, MD 01/27/15 [phone]

## 2015-01-27 NOTE — Care Management Note (Addendum)
    Page 1 of 1   01/29/2015     2:24:21 PM CARE MANAGEMENT NOTE 01/29/2015  Patient:  Dustin [name], Dustin [name]   Account Number:  [id number]  Date Initiated:  01/27/2015  Documentation initiated by:  CHILDRESS,JESSICA  Subjective/Objective Assessment:   Pt admitted with CAP. Pt  is from home with self care. Pt has cane and walker for PRN use at home. Pt has no HH services, DME's or med needs prior to admission. Pt has used [facility] for [facility] in the past and would like to use them again if needed     Action/Plan:   Pt lives with wife. Pt's PCP is Dr. Gerarda Fraction. Pt plans to discharge home with self care. Will continue to follow for CM needs.   Anticipated DC Date:  01/30/2015   Anticipated DC Plan:  [place]  CM consult      Choice offered to / List presented to:             Status of service:  Completed, signed off Medicare Important Message given?  YES (If response is "NO", the following Medicare IM given date fields will be blank) Date Medicare IM given:  01/29/2015 Medicare IM given by:  Jolene Provost Date Additional Medicare IM given:   Additional Medicare IM given by:    Discharge Disposition:  HOME/SELF CARE  Per UR Regulation:  Reviewed for med. necessity/level of care/duration of stay  If discussed at [place] of Stay Meetings, dates discussed:    Comments:  01/29/2015 [place], RN, MSN, CM Pt plans to discharge home with self care today. Pt does not qualify for home O2. No CM needs identified. 01/27/2015 [place], RN, MSN, CM

## 2015-01-27 NOTE — Care Management Utilization Note (Signed)
UR completed 

## 2015-01-27 NOTE — Progress Notes (Signed)
[name] Progress Note   Dustin [name]:[id number] DOB: 1934-12-26 DOA: 01/26/2015 PCP: Glo Herring., MD  Brief narrative: Dustin [name] is a 81 y.o. male male with a history of CAD, HTN, Hypothyroid, Hyperlipidemia, and Alzheimers Dementia who presents to the ED with complaints of fevers chills and increased coughing x 1 week.He is found to have bibasilar consolidation.    Subjective: Cough improving  Assessment/Plan:  1. CAP (community acquired pneumonia) cont IV Levaquin- WBC count still elevated Albuterol Nebs Monitor O2 sats O2 PRN  Active Problems:  2. Hypoxia- due to #1 O2 PRN Monitor O2 sats     3. HYPERTENSION, BENIGN Continue Coreg Monitor BPs    4. Coronary atherosclerosis of native coronary artery Continue Carvedilol, Plavix, Pravastatin Rx and SL NTG PRN    5. Hyperlipidemia Continue Pravastatin Rx    6. ALZHEIMERS DISEASE Continue Aricept and Namenda Rx    Code Status:Full code Family Communication:  Disposition Plan: home when stable DVT prophylaxis: Lovenox  Consultants:   Procedures:   Antibiotics: Anti-infectives    Start     Dose/Rate Route Frequency Ordered Stop   01/28/15 0100  levofloxacin (LEVAQUIN) IVPB 500 mg     500 mg100 mL/hr over 60 Minutes Intravenous Every 24 hours 01/27/15 0227     01/27/15 0045  levofloxacin (LEVAQUIN) IVPB 750 mg     750 mg100 mL/hr over 90 Minutes Intravenous  Once 01/27/15 0044 01/27/15 0323         Objective: Filed Weights   01/27/15 0248  Weight: 86.183 kg (190 lb)   No intake or output data in the 24 hours ending 01/27/15 1152   Vitals Filed Vitals:   01/27/15  0115 01/27/15 0130 01/27/15 0248 01/27/15 0546  BP:  118/53 110/43 102/42  Pulse: 61 58 60 46  Temp:   98.9 F (37.2 C) 97.8 F (36.6 C)  TempSrc:   Oral Oral  Resp: 17 11 20 16   Height:   5\' 9"  (1.753 m)   Weight:   86.183 kg (190 lb)   SpO2: 94% 93% 94% 96%    Exam: General: No acute respiratory distress Lungs: Clear to auscultation bilaterally without wheezes or crackles Cardiovascular: Regular rate and rhythm without murmur gallop or rub normal S1 and S2 Abdomen: Nontender, nondistended, soft, bowel sounds positive, no rebound, no ascites, no appreciable mass Extremities: No significant cyanosis, clubbing, or edema bilateral lower extremities  Data Reviewed: Basic Metabolic Panel:  Recent Labs Lab 01/26/15 2239 01/27/15 0534  NA 138 139  K 4.2 3.8  CL 100 101  CO2 31 31  GLUCOSE 133* 118*  BUN 23 25*  CREATININE 0.85 0.84  CALCIUM 9.4 8.6   Liver Function Tests:  Recent Labs Lab 01/26/15 2239  AST 22  ALT 19  ALKPHOS 61  BILITOT 1.1  PROT 8.4*  ALBUMIN 4.8   No results for input(s): LIPASE, AMYLASE in the last 168 hours. No results for input(s): AMMONIA in the last 168 hours. CBC:  Recent Labs Lab 01/26/15 2239 01/27/15 0534  WBC 15.7* 15.3*  NEUTROABS 13.8*  --   HGB 13.6 11.0*  HCT 41.6 33.7*  MCV 93.5 93.4  PLT 235 192   Cardiac Enzymes:  Recent Labs Lab 01/26/15 2239  TROPONINI <0.03   BNP (last 3 results)  Recent Labs  08/30/14 1200  PROBNP 1378.0*   CBG: No results for input(s): GLUCAP in the last 168 hours.  Recent Results (from the past 240 hour(s))  Culture, blood (  routine x 2)     Status: None (Preliminary result)   Collection Time: 01/27/15 12:26 AM  Result Value Ref Range Status   Specimen Description BLOOD RIGHT ARM  Final   Special Requests BOTTLES DRAWN AEROBIC AND ANAEROBIC 8CC  Final   Culture NO GROWTH <24 HRS  Final   Report Status PENDING  Incomplete  Culture, blood (routine x 2)     Status: None  (Preliminary result)   Collection Time: 01/27/15 12:32 AM  Result Value Ref Range Status   Specimen Description BLOOD RIGHT HAND  Final   Special Requests BOTTLES DRAWN AEROBIC AND ANAEROBIC 6CC  Final   Culture NO GROWTH <24 HRS  Final   Report Status PENDING  Incomplete     Studies:  Recent x-ray studies have been reviewed in detail by the Attending Physician  Scheduled Meds:  Scheduled Meds: . carvedilol  3.125 mg Oral BID WC  . clopidogrel  75 mg Oral Daily  . donepezil  10 mg Oral QHS  . enoxaparin (LOVENOX) injection  30 mg Subcutaneous Q24H  . escitalopram  10 mg Oral Daily  . fentaNYL  100 mcg Transdermal Q72H  . gabapentin  100 mg Oral Daily  . [START ON 01/28/2015] levofloxacin (LEVAQUIN) IV  500 mg Intravenous Q24H  . levothyroxine  25 mcg Oral QAC breakfast  . memantine  5 mg Oral Daily  . pravastatin  40 mg Oral q1800  . sorbitol  20 mL Oral BID  . traZODone  50 mg Oral QHS   Continuous Infusions: . sodium chloride 100 mL/hr at 01/27/15 0312  . sodium chloride 75 mL/hr at 01/27/15 0330    Time spent on care of this patient: 35 min   [place], MD 01/27/2015, 11:52 AM  LOS: 1 day   [name] Office  [phone] Pager - Text Page per [email or web address]  If 7PM-7AM, please contact night-coverage [email or web address]

## 2015-01-27 NOTE — H&P (Signed)
[name] Admission History and Physical       ARMSTRONG CREASY KNL:[id number] DOB: [birth year] DOA: 01/26/2015  Referring physician:  EDP PCP: Glo Herring., MD  Specialists:   Chief Complaint: Fevers Chills and Increased Coughing  HPI: Dustin [name] is a 81 y.o. male with a history of CAD, HTN, Hypothyroid, Hyperlipidemia, and Alzheimers Dementia who presents to the ED with complaints of fevers chills and increased coughing x 1 week.  His wife and daughter give the history and report that he has had weakness and myalgias.  His cough has been productive of yellow sputum.    He was found in the ED to have hypoxia with decreased O2 sats of 89%.  He as placed on NCO2 at 2 liters with improvement.   A Chest X-ray revealed Bibasilar Air Space Opacities, and he was placed on IV Levaquin to cover CAP pneumonia.     Review of Systems:  Constitutional: No Weight Loss, No Weight Gain, Night Sweats, +Fevers, +Chills, Dizziness,+ Fatigue, or +Generalized Weakness HEENT: No Headaches, Difficulty Swallowing,Tooth/Dental Problems,Sore Throat,  No Sneezing, Rhinitis, Ear Ache, Nasal Congestion, or Post Nasal Drip,  Cardio-vascular:  No Chest pain, Orthopnea, PND, Edema in Lower Extremities, Anasarca, Dizziness, Palpitations  Resp: +Dyspnea, No DOE, +Productive Cough, No Non-Productive Cough, No Hemoptysis, No Wheezing.    GI: No Heartburn, Indigestion, Abdominal Pain, Nausea, Vomiting, Diarrhea, Hematemesis, Hematochezia, Melena, Change in Bowel Habits,  Loss of Appetite  GU: No Dysuria, Change in Color of Urine, No Urgency or Frequency, No Flank pain.  Musculoskeletal: No Joint Pain or Swelling, No Decreased Range of Motion, No Back Pain.  Neurologic: No Syncope, No Seizures, Muscle Weakness, Paresthesia, Vision Disturbance or Loss, No Diplopia, No Vertigo, No Difficulty Walking,  Skin: No Rash or Lesions. Psych: No Change in Mood or Affect, No Depression or Anxiety, +Memory loss, No  Confusion, or Hallucinations   Past Medical History  Diagnosis Date  . Arthritis   . Coronary atherosclerosis of native coronary artery     a. Multivessel s/p CABG 1996. b. s/p DES x 2 SVG to PDA 8/12 with distal disease managed medically.  . Mixed hyperlipidemia   . Essential hypertension, benign   . MI (myocardial infarction)   . Hematuria   . OSA (obstructive sleep apnea)   . Anxiety   . OA (osteoarthritis)   . DDD (degenerative disc disease)     Chronic back pain  . Alzheimer disease   . Enlarged prostate   . Hypothyroidism   . Atrophic gastritis     a. By EGD 02/2013.      Past Surgical History  Procedure Laterality Date  . Coronary artery bypass graft  1996    LIMA to LAD, SVG to D2, SVG to PDA, SVG to OM1 and OM2  . Hernia repair    . Coronary angioplasty with stent placement    . Colonoscopy  08/03/2004    Kalila Adkison-numerous large diverticula in the descending, transverse, descending, and sigmoid colon. Otherwise normal exam.  . Colonoscopy  07/12/2012    RMR: External hemorrhoidal tag; multiple rectal and colonic polyps removed and/or treated as described above. Pancolonic diverticulosis. Bx-tubular adenomas, rectal hyperplastic polyp. next colonoscopy in 06/2015.  [name] Esophagogastroduodenoscopy N/A 03/16/2013    Procedure: ESOPHAGOGASTRODUODENOSCOPY (EGD);  Surgeon: Daneil Dolin, MD;  Location: [facility];  Service: Endoscopy;  Laterality: N/A;  12:00-moved to [place] notified pt  . Esophagogastroduodenoscopy N/A 03/06/2013    Procedure: ESOPHAGOGASTRODUODENOSCOPY (EGD);  Surgeon: Herbie Baltimore  Hilton Cork, MD;  Location: [facility];  Service: Endoscopy;  Laterality: N/A;       Prior to Admission medications   Medication Sig Start Date End Date Taking? Authorizing Provider  ALPRAZolam Duanne Moron) 1 MG tablet Take 1 tablet (1 mg total) by mouth daily as needed. For anxiety 09/03/14   Nita Sells, MD  carvedilol (COREG) 3.125 MG tablet Take 1 tablet by mouth 2 (two)  times daily. 08/06/14   Historical Provider, MD  clopidogrel (PLAVIX) 75 MG tablet Take 75 mg by mouth daily.     Historical Provider, MD  donepezil (ARICEPT) 10 MG tablet Take 1 tablet by mouth At bedtime. 07/01/12   Historical Provider, MD  escitalopram (LEXAPRO) 10 MG tablet Take 10 mg by mouth daily.    Historical Provider, MD  fentaNYL (DURAGESIC - DOSED MCG/HR) 100 MCG/HR Place 1 patch (100 mcg total) onto the skin every 3 (three) days. 08/31/14   Tiffany L Reed, DO  gabapentin (NEURONTIN) 100 MG capsule Take 100 mg by mouth daily.    Historical Provider, MD  levofloxacin (LEVAQUIN) 500 MG tablet Take 1 tablet (500 mg total) by mouth daily. 09/03/14   Nita Sells, MD  levothyroxine (SYNTHROID, LEVOTHROID) 25 MCG tablet Take 25 mcg by mouth daily.  06/19/12   Historical Provider, MD  NAMENDA 5 MG tablet Take 5 mg by mouth daily.  07/23/11   Historical Provider, MD  NITROSTAT 0.4 MG SL tablet PLACE ONE TABLET UNDER TONGUE EVERY 5 MIN UP TO 3 DOSES AS NEEDED FORCHEST PAIN. 10/25/14   Satira Sark, MD  oxyCODONE (OXY IR/ROXICODONE) 5 MG immediate release tablet Take 1 tablet (5 mg total) by mouth every 4 (four) hours as needed for moderate pain. 09/03/14   Nita Sells, MD  pravastatin (PRAVACHOL) 40 MG tablet TAKE (1) TABLET BY MOUTH AT BEDTIME. 01/13/15   Lendon Colonel, NP  sorbitol 70 % SOLN Take 20 mLs by mouth 2 (two) times daily. 09/03/14   Nita Sells, MD  traZODone (DESYREL) 50 MG tablet Take 50 mg by mouth at bedtime.  06/06/14   Historical Provider, MD      No Known Allergies   Social History:  reports that he quit smoking about 20 years ago. His smoking use included Cigarettes. He has a 80 pack-year smoking history. He has never used smokeless tobacco. He reports that he does not drink alcohol or use illicit drugs.     Family History  Problem Relation Age of Onset  . Heart disease    . Colon cancer Neg Hx        Physical Exam:  GEN:  Pleasant Well  Developed Clemmie.Rimes y.o. Caucasian male examined and in no acute distress; cooperative with exam Filed Vitals:   01/27/15 0100 01/27/15 0115 01/27/15 0130 01/27/15 0248  BP: 109/51  118/53 110/43  Pulse: 59 61 58 60  Temp:    98.9 F (37.2 C)  TempSrc:    Oral  Resp: 15 17 11 20   Height:    5\' 9"  (1.753 m)  Weight:    86.183 kg (190 lb)  SpO2: 94% 94% 93% 94%   Blood pressure 110/43, pulse 60, temperature 98.9 F (37.2 C), temperature source Oral, resp. rate 20, height 5\' 9"  (1.753 m), weight 86.183 kg (190 lb), SpO2 94 %. PSYCH: He is alert and oriented x 2; does not appear anxious does not appear depressed; affect is normal HEENT: Normocephalic and Atraumatic, Mucous membranes pink; PERRLA; EOM intact; Fundi:  Benign;  No scleral icterus, Nares: Patent, Oropharynx: Clear,    Neck:  FROM, No Cervical Lymphadenopathy nor Thyromegaly or Carotid Bruit; No JVD; Breasts:: Not examined CHEST WALL: No tenderness CHEST: Normal respiration, clear to auscultation bilaterally HEART: Regular rate and rhythm; no murmurs rubs or gallops BACK: No kyphosis or scoliosis; No CVA tenderness ABDOMEN: Positive Bowel Sounds, Soft Non-Tender; No Masses, No Organomegaly. Rectal Exam: Not done EXTREMITIES: No Cyanosis, Clubbing, or Edema; No Ulcerations. Genitalia: not examined PULSES: 2+ and symmetric SKIN: Normal hydration no rash or ulceration CNS: Alert and Oriented x 2, No Focal Deficits Vascular: pulses palpable throughout    Labs on Admission:  Basic Metabolic Panel:  Recent Labs Lab 01/26/15 2239  NA 138  K 4.2  CL 100  CO2 31  GLUCOSE 133*  BUN 23  CREATININE 0.85  CALCIUM 9.4   Liver Function Tests:  Recent Labs Lab 01/26/15 2239  AST 22  ALT 19  ALKPHOS 61  BILITOT 1.1  PROT 8.4*  ALBUMIN 4.8   No results for input(s): LIPASE, AMYLASE in the last 168 hours. No results for input(s): AMMONIA in the last 168 hours. CBC:  Recent Labs Lab 01/26/15 2239  WBC 15.7*    NEUTROABS 13.8*  HGB 13.6  HCT 41.6  MCV 93.5  PLT 235   Cardiac Enzymes:  Recent Labs Lab 01/26/15 2239  TROPONINI <0.03    BNP (last 3 results)  Recent Labs  08/30/14 1200  PROBNP 1378.0*   CBG: No results for input(s): GLUCAP in the last 168 hours.  Radiological Exams on Admission: Dg Chest 2 View  01/27/2015   CLINICAL DATA:  Altered mental status, worsening from baseline dementia. Shortness of breath, weakness.  EXAM: CHEST  2 VIEW  COMPARISON:  Chest radiograph September 02, 2014  FINDINGS: The cardiac silhouette is moderately enlarged, unchanged. Status post median sternotomy for CABG. Patchy LEFT greater than RIGHT lower lobe airspace opacities. No pleural effusion. No pneumothorax. Patient is osteopenic. Mild degenerative change of the thoracic spine.  IMPRESSION: Patchy LEFT greater than RIGHT lung base airspace opacities could reflect pneumonia.  Stable cardiomegaly.  Mild bronchitic changes.   Electronically Signed   By: Elon Alas   On: 01/27/2015 00:01     EKG: Independently reviewed. Normal Sinus Rhythm rate 74 with a LBBB   Assessment/Plan:   81 y.o. male with  Principal Problem:   1.   CAP (community acquired pneumonia)   IV Levaquin   Albuterol Nebs   Monitor O2 sats   O2 PRN  Active Problems:   2.   Hypoxia- due to #1   O2 PRN   Monitor O2 sats        3.   HYPERTENSION, BENIGN   Continue    Monitor BPs     4.   Coronary atherosclerosis of native coronary artery   Continue Carvedilol, Plavix, Pravastatin Rx and SL NTG PRN     5.   Hyperlipidemia   Continue Pravastatin Rx     6.   ALZHEIMERS DISEASE   Continue Aricept and Namenda Rx     7.   DVT Prophylaxis    Lovenox     Code Status:   FULL CODE    Family Communication:    Wife and Daughter at Bedside Disposition Plan:     Inpatient Med/surg Bed    Time spent:  [place] [name] Pager [phone]   If [place] Please Contact the Day  Rounding Team MD for [name]  If 7PM-7AM, Please Contact Night-Floor Coverage  [email or web address] Password [facility] 01/27/2015, 5:32 AM

## 2015-01-27 NOTE — Progress Notes (Addendum)
Patient and family state patient takes 15mg  Oxy IR PO at home.  According to his PTA med list and Hoffman, Oxy IR is prescribed 5mg  PO every 4 hours.  I have tried to explain this to the family much to their insistence.  Dr. Wynelle Cleveland notified.  No new orders received.  Will continue to monitor patient and his pain.  Spoke with Dr. Wynelle Cleveland.  She stated she would like for patient to bring in pill bottle.  I passed that message on to the family.  Will look for them to bring it in.

## 2015-01-28 DIAGNOSIS — R0902 Hypoxemia: Secondary | ICD-10-CM

## 2015-01-28 LAB — BASIC METABOLIC PANEL
BUN: 12 mg/dL (ref 6–23)
CALCIUM: 8.1 mg/dL — AB (ref 8.4–10.5)
CHLORIDE: 104 mmol/L (ref 96–112)
CO2: 34 mmol/L — AB (ref 19–32)
Creatinine, Ser: 0.57 mg/dL (ref 0.50–1.35)
GFR calc Af Amer: >90 mL/min (ref >90)
Glucose, Bld: 96 mg/dL (ref 70–99)
Potassium: 3.3 mmol/L — ABNORMAL LOW (ref 3.5–5.1)
SODIUM: 138 mmol/L (ref 135–145)

## 2015-01-28 LAB — CBC
HEMATOCRIT: 31.3 % — AB (ref 39.0–52.0)
Hemoglobin: 10.1 g/dL — ABNORMAL LOW (ref 13.0–17.0)
MCH: 30.5 pg (ref 26.0–34.0)
MCHC: 32.3 g/dL (ref 30.0–36.0)
MCV: 94.6 fL (ref 78.0–100.0)
Platelets: 148 10*3/uL — ABNORMAL LOW (ref 150–400)
RBC: 3.31 MIL/uL — ABNORMAL LOW (ref 4.22–5.81)
RDW: 13.6 % (ref 11.5–15.5)
WBC: 8.1 10*3/uL (ref 4.0–10.5)

## 2015-01-28 LAB — URINE CULTURE
Colony Count: NO GROWTH
Culture: NO GROWTH

## 2015-01-28 MED ORDER — LEVOFLOXACIN 750 MG PO TABS: 750.0000 mg | ORAL_TABLET | Freq: Every day | ORAL | Status: DC

## 2015-01-28 NOTE — Progress Notes (Signed)
Dr. Wynelle Cleveland notified that patient has a productive cough with blood tinged sputum.  Will continue to monitor patient.

## 2015-01-28 NOTE — Progress Notes (Signed)
Physician Discharge Summary  Dustin [name] ERD:[id number] DOB: 06-29-1934 DOA: 01/26/2015  PCP: Glo Herring., MD  Admit date: 01/26/2015 Discharge date: 01/28/2015  Time spent: 55 minutes    Discharge Condition: stable Diet recommendation: heart healthy diet  Discharge Diagnoses:  Principal Problem:   CAP (community acquired pneumonia) Active Problems:   Hyperlipidemia   ALZHEIMERS DISEASE   HYPERTENSION, BENIGN   Coronary atherosclerosis of native coronary artery   Hypoxia   History of present illness:  Dustin [name] is a 81 y.o. male male with a history of CAD, HTN, Hypothyroid, Hyperlipidemia, and Alzheimers Dementia who presents to the ED with complaints of fevers chills and increased coughing x 1 week.He is found to have bibasilar consolidation.   Hospital Course:  1. CAP (community acquired pneumonia) have transitioned to oral Levaquin- WBC count improved from 15 to 8.1 Albuterol Nebs Monitor O2 sats O2 PRN  Active Problems:  2. Hypoxia- due to #1 O2 PRN Monitor O2 sats    3. HYPERTENSION, BENIGN Continue Coreg Monitor BPs    4. Coronary atherosclerosis of native coronary artery Continue Carvedilol, Plavix, Pravastatin Rx and SL NTG PRN    5. Hyperlipidemia Continue Pravastatin Rx    6. ALZHEIMERS DISEASE Continue Aricept and Namenda Rx  Procedures:  none  Consultations:  none  Discharge Exam: Filed Weights   01/27/15 0248  Weight: 86.183 kg (190 lb)   Filed Vitals:   01/28/15 1331  BP: 135/50  Pulse: 60  Temp: 97.7 F (36.5 C)  Resp: 20    General: AAO x 3, no distress Cardiovascular: RRR, no murmurs  Respiratory: clear to  auscultation bilaterally GI: soft, non-tender, non-distended, bowel sound positive  Discharge Instructions You were cared for by a hospitalist during your hospital stay. If you have any questions about your discharge medications or the care you received while you were in the hospital after you are discharged, you can call the unit and asked to speak with the hospitalist on call if the hospitalist that took care of you is not available. Once you are discharged, your primary care physician will handle any further medical issues. Please note that NO REFILLS for any discharge medications will be authorized once you are discharged, as it is imperative that you return to your primary care physician (or establish a relationship with a primary care physician if you do not have one) for your aftercare needs so that they can reassess your need for medications and monitor your lab values.     Medication List    TAKE these medications        ALPRAZolam 1 MG tablet  Commonly known as:  XANAX  Take 1 tablet (1 mg total) by mouth daily as needed. For anxiety     amLODipine 10 MG tablet  Commonly known as:  NORVASC  Take 10 mg by mouth daily.     carvedilol 3.125 MG tablet  Commonly known as:  COREG  Take 1 tablet by mouth 2 (two) times daily.     donepezil 10 MG tablet  Commonly known as:  ARICEPT  Take 1 tablet by mouth At bedtime.     escitalopram 10 MG tablet  Commonly known as:  LEXAPRO  Take 10 mg by mouth daily.     fentaNYL 100 MCG/HR  Commonly known as:  DURAGESIC - dosed mcg/hr  Place 1 patch (100 mcg total) onto the skin every 3 (three) days.     gabapentin 100 MG capsule  Commonly known as:  NEURONTIN  Take 100 mg by mouth daily.     levofloxacin 750 MG tablet  Commonly known as:  LEVAQUIN  Take 1 tablet (750 mg total) by mouth daily.     NAMENDA 5 MG tablet  Generic drug:  memantine  Take 5 mg by mouth daily.     NITROSTAT 0.4 MG SL tablet  Generic drug:  nitroGLYCERIN   PLACE ONE TABLET UNDER TONGUE EVERY 5 MIN UP TO 3 DOSES AS NEEDED FORCHEST PAIN.     oxyCODONE 15 MG immediate release tablet  Commonly known as:  ROXICODONE  Take 15 mg by mouth every 4 (four) hours as needed for pain (*Max of 8 tablets daily as needed every 4 to 6 hours for pain*).     pravastatin 40 MG tablet  Commonly known as:  PRAVACHOL  TAKE (1) TABLET BY MOUTH AT BEDTIME.     traZODone 50 MG tablet  Commonly known as:  DESYREL  Take 50 mg by mouth at bedtime.       No Known Allergies    The results of significant diagnostics from this hospitalization (including imaging, microbiology, ancillary and laboratory) are listed below for reference.    Significant Diagnostic Studies: Dg Chest 2 View  01/27/2015   CLINICAL DATA:  Altered mental status, worsening from baseline dementia. Shortness of breath, weakness.  EXAM: CHEST  2 VIEW  COMPARISON:  Chest radiograph September 02, 2014  FINDINGS: The cardiac silhouette is moderately enlarged, unchanged. Status post median sternotomy for CABG. Patchy LEFT greater than RIGHT lower lobe airspace opacities. No pleural effusion. No pneumothorax. Patient is osteopenic. Mild degenerative change of the thoracic spine.  IMPRESSION: Patchy LEFT greater than RIGHT lung base airspace opacities could reflect pneumonia.  Stable cardiomegaly.  Mild bronchitic changes.   Electronically Signed   By: Elon Alas   On: 01/27/2015 00:01    Microbiology: Recent Results (from the past 240 hour(s))  Culture, blood (routine x 2)     Status: None (Preliminary result)   Collection Time: 01/27/15 12:26 AM  Result Value Ref Range Status   Specimen Description BLOOD RIGHT ARM  Final   Special Requests BOTTLES DRAWN AEROBIC AND ANAEROBIC 8CC  Final   Culture NO GROWTH 1 DAY  Final   Report Status PENDING  Incomplete  Culture, blood (routine x 2)     Status: None (Preliminary result)   Collection Time: 01/27/15 12:32 AM  Result Value Ref Range Status    Specimen Description BLOOD RIGHT HAND  Final   Special Requests BOTTLES DRAWN AEROBIC AND ANAEROBIC 6CC  Final   Culture NO GROWTH 1 DAY  Final   Report Status PENDING  Incomplete     Labs: Basic Metabolic Panel:  Recent Labs Lab 01/26/15 2239 01/27/15 0534 01/28/15 0804  NA 138 139 138  K 4.2 3.8 3.3*  CL 100 101 104  CO2 31 31 34*  GLUCOSE 133* 118* 96  BUN 23 25* 12  CREATININE 0.85 0.84 0.57  CALCIUM 9.4 8.6 8.1*   Liver Function Tests:  Recent Labs Lab 01/26/15 2239  AST 22  ALT 19  ALKPHOS 61  BILITOT 1.1  PROT 8.4*  ALBUMIN 4.8   No results for input(s): LIPASE, AMYLASE in the last 168 hours. No results for input(s): AMMONIA in the last 168 hours. CBC:  Recent Labs Lab 01/26/15 2239 01/27/15 0534 01/28/15 0804  WBC 15.7* 15.3* 8.1  NEUTROABS 13.8*  --   --   HGB 13.6 11.0* 10.1*  HCT 41.6 33.7* 31.3*  MCV 93.5 93.4 94.6  PLT 235 192 148*   Cardiac Enzymes:  Recent Labs Lab 01/26/15 2239  TROPONINI <0.03   BNP: BNP (last 3 results) No results for input(s): BNP in the last 8760 hours.  ProBNP (last 3 results)  Recent Labs  08/30/14 1200  PROBNP 1378.0*    CBG: No results for input(s): GLUCAP in the last 168 hours.     SignedDebbe Odea, MD [name] 01/28/2015, 2:19 PM

## 2015-01-28 NOTE — Progress Notes (Signed)
Eyes closed, respirations even at 16.  Aroused easily to touch.

## 2015-01-28 NOTE — Progress Notes (Signed)
Patient ambulated approximately 500 ft in hallway on RA with NT.  O2 sats stayed above 93% while ambulating.  Patient tolerated ambulation well.

## 2015-01-29 ENCOUNTER — Inpatient Hospital Stay (HOSPITAL_COMMUNITY): Payer: PPO

## 2015-01-29 LAB — BASIC METABOLIC PANEL
Anion gap: 4 — ABNORMAL LOW (ref 5–15)
BUN: 10 mg/dL (ref 6–23)
CALCIUM: 8.4 mg/dL (ref 8.4–10.5)
CHLORIDE: 107 mmol/L (ref 96–112)
CO2: 28 mmol/L (ref 19–32)
Creatinine, Ser: 0.64 mg/dL (ref 0.50–1.35)
GFR calc Af Amer: >90 mL/min (ref >90)
GLUCOSE: 136 mg/dL — AB (ref 70–99)
Potassium: 3.5 mmol/L (ref 3.5–5.1)
SODIUM: 139 mmol/L (ref 135–145)

## 2015-01-29 MED ORDER — SODIUM CHLORIDE 0.9 % IJ SOLN: 3.0000 mL | INTRAMUSCULAR | Status: DC | PRN

## 2015-01-29 MED ORDER — CLOPIDOGREL BISULFATE 75 MG PO TABS: 75.0000 mg | ORAL_TABLET | Freq: Every day | ORAL | Status: DC

## 2015-01-29 MED ORDER — SODIUM CHLORIDE 0.9 % IV SOLN: 250.0000 mL | INTRAVENOUS | Status: DC | PRN

## 2015-01-29 MED ORDER — LEVOFLOXACIN 750 MG PO TABS: 750.0000 mg | ORAL_TABLET | Freq: Every day | ORAL | Status: DC

## 2015-01-29 MED ORDER — SODIUM CHLORIDE 0.9 % IJ SOLN
3.0000 mL | Freq: Two times a day (BID) | INTRAMUSCULAR | Status: DC
  Administered 2015-01-29: 3 mL via INTRAVENOUS

## 2015-01-29 MED ORDER — POTASSIUM CHLORIDE CRYS ER 20 MEQ PO TBCR
40.0000 meq | EXTENDED_RELEASE_TABLET | Freq: Once | ORAL | Status: AC
  Administered 2015-01-29: 40 meq via ORAL
  Filled 2015-01-29: qty 2

## 2015-01-29 MED ORDER — FUROSEMIDE 10 MG/ML IJ SOLN
40.0000 mg | Freq: Once | INTRAMUSCULAR | Status: AC
  Administered 2015-01-29: 40 mg via INTRAVENOUS
  Filled 2015-01-29: qty 4

## 2015-01-29 NOTE — Discharge Summary (Signed)
Physician Discharge Summary  Dustin [name]:[id number] DOB: [birth year] DOA: 01/26/2015  PCP: Dustin [name]., MD  Admit date: 01/26/2015 Discharge date: 01/29/2015  Time spent: 30 minutes  Recommendations for Outpatient Follow-up:  1. Follow up with PCP in one week.   Discharge Diagnoses:  Principal Problem:   CAP (community acquired pneumonia) Active Problems:   Hyperlipidemia   ALZHEIMERS DISEASE   HYPERTENSION, BENIGN   Coronary atherosclerosis of native coronary artery   Hypoxia   Discharge Condition: improved  Diet recommendation: low sodium diet.   Filed Weights   01/27/15 0248  Weight: 86.183 kg (190 lb)    History of present illness:  Dustin [name] is a 81 y.o. male male with a history of CAD, HTN, Hypothyroid, Hyperlipidemia, and Alzheimers Dementia who presents to the ED with complaints of fevers chills and increased coughing x 1 week.He is found to have bibasilar consolidation. he was admitted for CAP and started on IV antibiotics.   Hospital Course:  CAP (community acquired pneumonia) have transitioned to oral Levaquin- WBC count improved from 15 to 8.1. Pt discharged on oral antibiotics to complete the course.    Active Problems:  2. Hypoxia- due to #1 O2 PRN Monitor O2 sats    3. HYPERTENSION, BENIGN Continue Coreg Monitor BPs    4. Coronary atherosclerosis of native coronary artery Continue Carvedilol, Plavix, Pravastatin Rx and SL NTG PRN    5. Hyperlipidemia Continue Pravastatin Rx    6. ALZHEIMERS DISEASE Continue Aricept and Namenda Rx  Procedures:  none  Consultations:  none  Discharge Exam: Filed Vitals:   01/29/15 0616  BP: 114/36  Pulse: 53  Temp: 98.7 F (37.1 C)   Resp: 20    General: alert afebrile comfortable Cardiovascular: s1s2 Respiratory: ctab  Discharge Instructions   Discharge Instructions    Diet - low sodium heart healthy    Complete by:  As directed      Discharge instructions    Complete by:  As directed   Follow up with PCP in one week.          Discharge Medication List as of 01/29/2015  4:06 PM    CONTINUE these medications which have CHANGED   Details  clopidogrel (PLAVIX) 75 MG tablet Take 1 tablet (75 mg total) by mouth daily., Starting 01/29/2015, Until Discontinued, No Print    levofloxacin (LEVAQUIN) 750 MG tablet Take 1 tablet (750 mg total) by mouth daily., Starting 01/29/2015, Until Discontinued, Print      CONTINUE these medications which have NOT CHANGED   Details  ALPRAZolam (XANAX) 1 MG tablet Take 1 tablet (1 mg total) by mouth daily as needed. For anxiety, Starting 09/03/2014, Until Discontinued, Print    amLODipine (NORVASC) 10 MG tablet Take 10 mg by mouth daily., Until Discontinued, Historical Med    carvedilol (COREG) 3.125 MG tablet Take 1 tablet by mouth 2 (two) times daily., Starting 08/06/2014, Until Discontinued, Historical Med    donepezil (ARICEPT) 10 MG tablet Take 1 tablet by mouth At bedtime., Starting 07/01/2012, Until Discontinued, Historical Med    escitalopram (LEXAPRO) 10 MG tablet Take 10 mg by mouth daily., Until Discontinued, Historical Med    fentaNYL (DURAGESIC - DOSED MCG/HR) 100 MCG/HR Place 1 patch (100 mcg total) onto the skin every 3 (three) days., Starting 08/31/2014, Until Discontinued, Print    gabapentin (NEURONTIN) 100 MG capsule Take 100 mg by mouth daily., Until Discontinued, Historical Med    NAMENDA 5 MG tablet Take 5 mg by  mouth daily. , Starting 07/23/2011, Until Discontinued, Historical Med    NITROSTAT 0.4 MG SL tablet PLACE ONE TABLET UNDER TONGUE EVERY 5 MIN UP TO 3 DOSES AS NEEDED FORCHEST PAIN., Normal    oxyCODONE (ROXICODONE) 15 MG immediate release tablet Take 15  mg by mouth every 4 (four) hours as needed for pain (*Max of 8 tablets daily as needed every 4 to 6 hours for pain*)., Until Discontinued, Historical Med    pravastatin (PRAVACHOL) 40 MG tablet TAKE (1) TABLET BY MOUTH AT BEDTIME., Normal    traZODone (DESYREL) 50 MG tablet Take 50 mg by mouth at bedtime. , Starting 06/06/2014, Until Discontinued, Historical Med      STOP taking these medications     levothyroxine (SYNTHROID, LEVOTHROID) 25 MCG tablet      sorbitol 70 % SOLN        No Known Allergies Follow-up Information    Follow up with Dustin [name]., MD. Schedule an appointment as soon as possible for a visit in 1 week.   Specialty:  Internal Medicine   Contact information:   [address] [place] [address] [phone]        The results of significant diagnostics from this hospitalization (including imaging, microbiology, ancillary and laboratory) are listed below for reference.    Significant Diagnostic Studies: Dg Chest 2 View  01/29/2015   CLINICAL DATA:  Pneumonia.  EXAM: CHEST  2 VIEW  COMPARISON:  01/26/2015 and 09/02/2014 and 03/11/2014  FINDINGS: There has been progression of the small patchy bilateral pulmonary infiltrates. There are new small bilateral pleural effusions. Heart size and pulmonary vascularity are normal. No acute osseous abnormality. CABG.  IMPRESSION: Slight progression of the patchy bilateral pulmonary infiltrates.   Electronically Signed   By: Rozetta Nunnery M.D.   On: 01/29/2015 11:11   Dg Chest 2 View  01/27/2015   CLINICAL DATA:  Altered mental status, worsening from baseline dementia. Shortness of breath, weakness.  EXAM: CHEST  2 VIEW  COMPARISON:  Chest radiograph September 02, 2014  FINDINGS: The cardiac silhouette is moderately enlarged, unchanged. Status post median sternotomy for CABG. Patchy LEFT greater than RIGHT lower lobe airspace opacities. No pleural effusion. No pneumothorax. Patient is osteopenic. Mild degenerative change  of the thoracic spine.  IMPRESSION: Patchy LEFT greater than RIGHT lung base airspace opacities could reflect pneumonia.  Stable cardiomegaly.  Mild bronchitic changes.   Electronically Signed   By: Elon Alas   On: 01/27/2015 00:01    Microbiology: Recent Results (from the past 240 hour(s))  Urine culture     Status: None   Collection Time: 01/26/15 10:47 PM  Result Value Ref Range Status   Specimen Description URINE, CATHETERIZED  Final   Special Requests NONE  Final   Colony Count NO GROWTH Performed at [facility]   Final   Culture NO GROWTH Performed at [facility]   Final   Report Status 01/28/2015 FINAL  Final  Culture, blood (routine x 2)     Status: None   Collection Time: 01/27/15 12:26 AM  Result Value Ref Range Status   Specimen Description BLOOD RIGHT ARM  Final   Special Requests BOTTLES DRAWN AEROBIC AND ANAEROBIC 8CC  Final   Culture NO GROWTH 5 DAYS  Final   Report Status 02/01/2015 FINAL  Final  Culture, blood (routine x 2)     Status: None   Collection Time: 01/27/15 12:32 AM  Result Value Ref Range Status   Specimen Description  BLOOD RIGHT HAND  Final   Special Requests BOTTLES DRAWN AEROBIC AND ANAEROBIC [facility]  Final   Culture NO GROWTH 5 DAYS  Final   Report Status 02/01/2015 FINAL  Final     Labs: Basic Metabolic Panel:  Recent Labs Lab 01/26/15 2239 01/27/15 0534 01/28/15 0804 01/29/15 1305  NA 138 139 138 139  K 4.2 3.8 3.3* 3.5  CL 100 101 104 107  CO2 31 31 34* 28  GLUCOSE 133* 118* 96 136*  BUN 23 25* 12 10  CREATININE 0.85 0.84 0.57 0.64  CALCIUM 9.4 8.6 8.1* 8.4   Liver Function Tests:  Recent Labs Lab 01/26/15 2239  AST 22  ALT 19  ALKPHOS 61  BILITOT 1.1  PROT 8.4*  ALBUMIN 4.8   No results for input(s): LIPASE, AMYLASE in the last 168 hours. No results for input(s): AMMONIA in the last 168 hours. CBC:  Recent Labs Lab 01/26/15 2239 01/27/15 0534 01/28/15 0804  WBC 15.7* 15.3* 8.1   NEUTROABS 13.8*  --   --   HGB 13.6 11.0* 10.1*  HCT 41.6 33.7* 31.3*  MCV 93.5 93.4 94.6  PLT 235 192 148*   Cardiac Enzymes:  Recent Labs Lab 01/26/15 2239  TROPONINI <0.03   BNP: BNP (last 3 results) No results for input(s): BNP in the last 8760 hours.  ProBNP (last 3 results)  Recent Labs  08/30/14 1200  PROBNP 1378.0*    CBG: No results for input(s): GLUCAP in the last 168 hours.     SignedHosie Poisson  [name] 02/02/2015, 6:08 PM

## 2015-01-29 NOTE — Progress Notes (Signed)
Patient discharged home with brother.  IV removed - WNL. Reviewed medications with brother and emphasized importance of completing antibiotic dose to prevent further infection.  Verbalizes understanding.   No questions at this time, stable to DC home.  Patient left unit via WC with NT assist.

## 2015-01-29 NOTE — Progress Notes (Signed)
Oxygen saturation remained 92%-97% while ambulating, tolerated well.

## 2015-02-01 LAB — CULTURE, BLOOD (ROUTINE X 2)
CULTURE: NO GROWTH
Culture: NO GROWTH

## 2015-03-24 ENCOUNTER — Ambulatory Visit (HOSPITAL_COMMUNITY)
Admission: RE | Admit: 2015-03-24 | Discharge: 2015-03-24 | Disposition: A | Discharge status: Home / Self Care | Payer: PPO | Source: Ambulatory Visit | Attending: Internal Medicine | Admitting: Internal Medicine

## 2015-03-24 ENCOUNTER — Other Ambulatory Visit (HOSPITAL_COMMUNITY): Payer: Self-pay | Admitting: Internal Medicine

## 2015-03-24 DIAGNOSIS — M47896 Other spondylosis, lumbar region: Secondary | ICD-10-CM | POA: Diagnosis not present

## 2015-03-24 DIAGNOSIS — M5136 Other intervertebral disc degeneration, lumbar region: Secondary | ICD-10-CM | POA: Diagnosis not present

## 2015-03-24 DIAGNOSIS — M545 Low back pain: Secondary | ICD-10-CM | POA: Diagnosis not present

## 2015-03-24 DIAGNOSIS — T07XXXA Unspecified multiple injuries, initial encounter: Secondary | ICD-10-CM

## 2015-03-24 DIAGNOSIS — T07 Unspecified multiple injuries: Secondary | ICD-10-CM | POA: Insufficient documentation

## 2015-04-08 ENCOUNTER — Other Ambulatory Visit: Payer: Self-pay | Admitting: Cardiology

## 2015-04-14 ENCOUNTER — Ambulatory Visit (INDEPENDENT_AMBULATORY_CARE_PROVIDER_SITE_OTHER): Payer: PPO | Admitting: Neurology

## 2015-04-14 ENCOUNTER — Encounter: Payer: Self-pay | Admitting: Neurology

## 2015-04-14 VITALS — BP 98/60 | HR 51 | Resp 16 | Wt 188.0 lb

## 2015-04-14 DIAGNOSIS — R413 Other amnesia: Secondary | ICD-10-CM | POA: Diagnosis not present
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
## 2015-04-14 NOTE — Patient Instructions (Signed)
1. Bring your paperwork from the [facility] so we can review it 2. Continue all your medications 3. Follow-up in 6 months

## 2015-04-17 ENCOUNTER — Telehealth: Payer: Self-pay | Admitting: *Deleted

## 2015-04-17 NOTE — Telephone Encounter (Signed)
I returned patient's call. He wanted to let us know that he tried to get his road test results from his local DMW office and they wouldn't give them to him, he states he was directed to call the main office in [place]. I did tell him that Dr. Delice Lesch did need those results before she could give her recommendations on him being able to drive. I did advise that the office in [place] would be the best route to go and to try and contact them asap as he only has about 2 more weeks until he has to stop driving if a doctor doesn't state that it's ok for him to drive.

## 2015-04-17 NOTE — Telephone Encounter (Signed)
Patient unable to get the results from his road test Call back number [phone]

## 2015-04-20 ENCOUNTER — Encounter: Payer: Self-pay | Admitting: Neurology

## 2015-04-20 DIAGNOSIS — R413 Other amnesia: Secondary | ICD-10-CM | POA: Insufficient documentation

## 2015-06-03 ENCOUNTER — Telehealth: Payer: Self-pay | Admitting: Neurology

## 2015-06-03 NOTE — Telephone Encounter (Signed)
Returned call, no answer. Couldn't leave msg mailbox was full. Will try again later.

## 2015-06-03 NOTE — Telephone Encounter (Signed)
Pt called and wanted to know if Dr Delice Lesch could write a little note to send to [facility] stating it is ok for him to drive/Dawn CB# [phone]

## 2015-06-03 NOTE — Telephone Encounter (Signed)
Called patient again & I was able to speak with him. I explained that Dr. Delice Lesch doesn't write letters for driving to the [facility], she requires that the patient bring in the official DMV forms for her to complete. I told him that he would need to get those forms from the [facility] and bring them in to our office for completion, once completed we do fax the forms to the [facility]. Also told him that for form completion there is a $25.00 fee that has to paid before we can submit the forms. Patient verbalized good understanding. He will drop off the forms on tomorrow.

## 2015-06-11 ENCOUNTER — Encounter: Payer: Self-pay | Admitting: Internal Medicine

## 2015-06-17 DIAGNOSIS — Z029 Encounter for administrative examinations, unspecified: Secondary | ICD-10-CM

## 2015-06-20 ENCOUNTER — Telehealth: Payer: Self-pay | Admitting: Family Medicine

## 2015-06-20 NOTE — Telephone Encounter (Signed)
Called patient and left msg on patient's voicemail that his DMV forms were faxed.

## 2015-08-06 ENCOUNTER — Other Ambulatory Visit: Payer: Self-pay | Admitting: Adult Health

## 2015-08-12 ENCOUNTER — Other Ambulatory Visit: Payer: Self-pay | Admitting: Adult Health

## 2015-08-29 ENCOUNTER — Other Ambulatory Visit: Payer: Self-pay | Admitting: Adult Health

## 2015-10-03 ENCOUNTER — Other Ambulatory Visit: Payer: Self-pay | Admitting: Adult Health

## 2015-10-14 ENCOUNTER — Ambulatory Visit: Payer: PPO | Admitting: Neurology

## 2016-01-08 DIAGNOSIS — I83893 Varicose veins of bilateral lower extremities with other complications: Secondary | ICD-10-CM | POA: Diagnosis not present

## 2016-01-08 DIAGNOSIS — G609 Hereditary and idiopathic neuropathy, unspecified: Secondary | ICD-10-CM | POA: Diagnosis not present

## 2016-01-08 DIAGNOSIS — L818 Other specified disorders of pigmentation: Secondary | ICD-10-CM | POA: Diagnosis not present

## 2016-01-08 DIAGNOSIS — Z6827 Body mass index (BMI) 27.0-27.9, adult: Secondary | ICD-10-CM | POA: Diagnosis not present

## 2016-02-02 DIAGNOSIS — Z0000 Encounter for general adult medical examination without abnormal findings: Secondary | ICD-10-CM | POA: Diagnosis not present

## 2016-02-02 DIAGNOSIS — Z6828 Body mass index (BMI) 28.0-28.9, adult: Secondary | ICD-10-CM | POA: Diagnosis not present

## 2016-02-02 DIAGNOSIS — Z1389 Encounter for screening for other disorder: Secondary | ICD-10-CM | POA: Diagnosis not present

## 2016-02-02 DIAGNOSIS — E663 Overweight: Secondary | ICD-10-CM | POA: Diagnosis not present

## 2016-02-23 DIAGNOSIS — E663 Overweight: Secondary | ICD-10-CM | POA: Diagnosis not present

## 2016-02-23 DIAGNOSIS — E782 Mixed hyperlipidemia: Secondary | ICD-10-CM | POA: Diagnosis not present

## 2016-02-23 DIAGNOSIS — G894 Chronic pain syndrome: Secondary | ICD-10-CM | POA: Diagnosis not present

## 2016-02-23 DIAGNOSIS — Z1389 Encounter for screening for other disorder: Secondary | ICD-10-CM | POA: Diagnosis not present

## 2016-02-23 DIAGNOSIS — E063 Autoimmune thyroiditis: Secondary | ICD-10-CM | POA: Diagnosis not present

## 2016-02-23 DIAGNOSIS — M1991 Primary osteoarthritis, unspecified site: Secondary | ICD-10-CM | POA: Diagnosis not present

## 2016-02-23 DIAGNOSIS — Z6828 Body mass index (BMI) 28.0-28.9, adult: Secondary | ICD-10-CM | POA: Diagnosis not present

## 2016-03-05 ENCOUNTER — Encounter: Payer: Self-pay | Admitting: Cardiology

## 2016-03-05 ENCOUNTER — Encounter: Payer: PPO | Admitting: Cardiology

## 2016-03-05 NOTE — Progress Notes (Signed)
No show  This encounter was created in error - please disregard.

## 2016-04-02 ENCOUNTER — Other Ambulatory Visit: Payer: Self-pay | Admitting: Adult Health

## 2016-04-03 ENCOUNTER — Emergency Department (HOSPITAL_COMMUNITY): Payer: PPO

## 2016-04-03 ENCOUNTER — Encounter (HOSPITAL_COMMUNITY): Payer: Self-pay | Admitting: Emergency Medicine

## 2016-04-03 ENCOUNTER — Inpatient Hospital Stay (HOSPITAL_COMMUNITY)
Admission: EM | Admit: 2016-04-03 | Discharge: 2016-04-06 | DRG: 917 | Disposition: A | Discharge status: Home Health | Payer: PPO | Attending: Internal Medicine | Admitting: Internal Medicine

## 2016-04-03 DIAGNOSIS — F419 Anxiety disorder, unspecified: Secondary | ICD-10-CM | POA: Diagnosis present

## 2016-04-03 DIAGNOSIS — R410 Disorientation, unspecified: Secondary | ICD-10-CM | POA: Diagnosis not present

## 2016-04-03 DIAGNOSIS — M25511 Pain in right shoulder: Secondary | ICD-10-CM | POA: Diagnosis not present

## 2016-04-03 DIAGNOSIS — Z86718 Personal history of other venous thrombosis and embolism: Secondary | ICD-10-CM

## 2016-04-03 DIAGNOSIS — I252 Old myocardial infarction: Secondary | ICD-10-CM | POA: Diagnosis not present

## 2016-04-03 DIAGNOSIS — R0602 Shortness of breath: Secondary | ICD-10-CM | POA: Diagnosis not present

## 2016-04-03 DIAGNOSIS — Z87891 Personal history of nicotine dependence: Secondary | ICD-10-CM | POA: Diagnosis not present

## 2016-04-03 DIAGNOSIS — E039 Hypothyroidism, unspecified: Secondary | ICD-10-CM | POA: Diagnosis present

## 2016-04-03 DIAGNOSIS — E876 Hypokalemia: Secondary | ICD-10-CM | POA: Diagnosis not present

## 2016-04-03 DIAGNOSIS — G8929 Other chronic pain: Secondary | ICD-10-CM | POA: Diagnosis not present

## 2016-04-03 DIAGNOSIS — T40601A Poisoning by unspecified narcotics, accidental (unintentional), initial encounter: Principal | ICD-10-CM

## 2016-04-03 DIAGNOSIS — Z86010 Personal history of colonic polyps: Secondary | ICD-10-CM | POA: Diagnosis not present

## 2016-04-03 DIAGNOSIS — F0280 Dementia in other diseases classified elsewhere without behavioral disturbance: Secondary | ICD-10-CM | POA: Diagnosis not present

## 2016-04-03 DIAGNOSIS — Z4682 Encounter for fitting and adjustment of non-vascular catheter: Secondary | ICD-10-CM | POA: Diagnosis not present

## 2016-04-03 DIAGNOSIS — M1990 Unspecified osteoarthritis, unspecified site: Secondary | ICD-10-CM | POA: Diagnosis not present

## 2016-04-03 DIAGNOSIS — J9600 Acute respiratory failure, unspecified whether with hypoxia or hypercapnia: Secondary | ICD-10-CM

## 2016-04-03 DIAGNOSIS — E782 Mixed hyperlipidemia: Secondary | ICD-10-CM | POA: Diagnosis not present

## 2016-04-03 DIAGNOSIS — R509 Fever, unspecified: Secondary | ICD-10-CM | POA: Diagnosis not present

## 2016-04-03 DIAGNOSIS — E785 Hyperlipidemia, unspecified: Secondary | ICD-10-CM | POA: Diagnosis present

## 2016-04-03 DIAGNOSIS — J9690 Respiratory failure, unspecified, unspecified whether with hypoxia or hypercapnia: Secondary | ICD-10-CM

## 2016-04-03 DIAGNOSIS — T402X1A Poisoning by other opioids, accidental (unintentional), initial encounter: Secondary | ICD-10-CM | POA: Diagnosis not present

## 2016-04-03 DIAGNOSIS — Z955 Presence of coronary angioplasty implant and graft: Secondary | ICD-10-CM

## 2016-04-03 DIAGNOSIS — J690 Pneumonitis due to inhalation of food and vomit: Secondary | ICD-10-CM | POA: Diagnosis not present

## 2016-04-03 DIAGNOSIS — R0902 Hypoxemia: Secondary | ICD-10-CM | POA: Diagnosis not present

## 2016-04-03 DIAGNOSIS — G92 Toxic encephalopathy: Secondary | ICD-10-CM | POA: Diagnosis present

## 2016-04-03 DIAGNOSIS — J9601 Acute respiratory failure with hypoxia: Secondary | ICD-10-CM | POA: Diagnosis not present

## 2016-04-03 DIAGNOSIS — I10 Essential (primary) hypertension: Secondary | ICD-10-CM | POA: Diagnosis present

## 2016-04-03 DIAGNOSIS — J9621 Acute and chronic respiratory failure with hypoxia: Secondary | ICD-10-CM | POA: Diagnosis present

## 2016-04-03 DIAGNOSIS — Z951 Presence of aortocoronary bypass graft: Secondary | ICD-10-CM | POA: Diagnosis not present

## 2016-04-03 DIAGNOSIS — G4733 Obstructive sleep apnea (adult) (pediatric): Secondary | ICD-10-CM | POA: Diagnosis not present

## 2016-04-03 DIAGNOSIS — M549 Dorsalgia, unspecified: Secondary | ICD-10-CM | POA: Diagnosis not present

## 2016-04-03 DIAGNOSIS — G309 Alzheimer's disease, unspecified: Secondary | ICD-10-CM | POA: Diagnosis not present

## 2016-04-03 DIAGNOSIS — F0390 Unspecified dementia without behavioral disturbance: Secondary | ICD-10-CM | POA: Diagnosis present

## 2016-04-03 DIAGNOSIS — G9340 Encephalopathy, unspecified: Secondary | ICD-10-CM | POA: Diagnosis present

## 2016-04-03 DIAGNOSIS — I2510 Atherosclerotic heart disease of native coronary artery without angina pectoris: Secondary | ICD-10-CM | POA: Diagnosis present

## 2016-04-03 LAB — CBC WITH DIFFERENTIAL/PLATELET
Basophils Absolute: 0 10*3/uL (ref 0.0–0.1)
Basophils Relative: 0 %
Eosinophils Absolute: 0.1 10*3/uL (ref 0.0–0.7)
Eosinophils Relative: 2 %
HCT: 38.8 % — ABNORMAL LOW (ref 39.0–52.0)
HEMOGLOBIN: 12.5 g/dL — AB (ref 13.0–17.0)
LYMPHS ABS: 1.1 10*3/uL (ref 0.7–4.0)
Lymphocytes Relative: 15 %
MCH: 30.5 pg (ref 26.0–34.0)
MCHC: 32.2 g/dL (ref 30.0–36.0)
MCV: 94.6 fL (ref 78.0–100.0)
MONOS PCT: 5 %
Monocytes Absolute: 0.4 10*3/uL (ref 0.1–1.0)
NEUTROS PCT: 78 %
Neutro Abs: 5.9 10*3/uL (ref 1.7–7.7)
Platelets: 218 10*3/uL (ref 150–400)
RBC: 4.1 MIL/uL — ABNORMAL LOW (ref 4.22–5.81)
RDW: 12.8 % (ref 11.5–15.5)
WBC: 7.6 10*3/uL (ref 4.0–10.5)

## 2016-04-03 LAB — RAPID URINE DRUG SCREEN, HOSP PERFORMED
AMPHETAMINES: NOT DETECTED
Barbiturates: NOT DETECTED
Benzodiazepines: POSITIVE — AB
Cocaine: NOT DETECTED
OPIATES: NOT DETECTED
Tetrahydrocannabinol: NOT DETECTED

## 2016-04-03 LAB — URINALYSIS, ROUTINE W REFLEX MICROSCOPIC
Bilirubin Urine: NEGATIVE
Glucose, UA: NEGATIVE mg/dL
KETONES UR: NEGATIVE mg/dL
LEUKOCYTES UA: NEGATIVE
Nitrite: NEGATIVE
PROTEIN: NEGATIVE mg/dL
Specific Gravity, Urine: 1.025 (ref 1.005–1.030)
pH: 5.5 (ref 5.0–8.0)

## 2016-04-03 LAB — BLOOD GAS, ARTERIAL
ACID-BASE EXCESS: 1.7 mmol/L (ref 0.0–2.0)
Acid-Base Excess: 2.4 mmol/L — ABNORMAL HIGH (ref 0.0–2.0)
Acid-Base Excess: 3 mmol/L — ABNORMAL HIGH (ref 0.0–2.0)
Acid-Base Excess: 3.8 mmol/L — ABNORMAL HIGH (ref 0.0–2.0)
BICARBONATE: 27.8 meq/L — AB (ref 20.0–24.0)
Bicarbonate: 26 mEq/L — ABNORMAL HIGH (ref 20.0–24.0)
Bicarbonate: 27.3 mEq/L — ABNORMAL HIGH (ref 20.0–24.0)
Bicarbonate: 27.4 mEq/L — ABNORMAL HIGH (ref 20.0–24.0)
DRAWN BY: 277331
DRAWN BY: 277331
Drawn by: 277331
Drawn by: 317771
FIO2: 0.5
FIO2: 0.4
FIO2: 0.4
LHR: 12 {breaths}/min
MECHVT: 560 mL
MECHVT: 560 mL
O2 Content: 3 L/min
O2 SAT: 99.2 %
O2 SAT: 97.5 %
O2 SAT: 94.9 %
O2 Saturation: 98.4 %
PCO2 ART: 48.1 mmHg — AB (ref 35.0–45.0)
PCO2 ART: 35.3 mmHg (ref 35.0–45.0)
PCO2 ART: 39 mmHg (ref 35.0–45.0)
PEEP/CPAP: 5 cmH2O
PEEP/CPAP: 5 cmH2O
PEEP: 5 cmH2O
PH ART: 7.37 (ref 7.350–7.450)
PH ART: 7.6 — AB (ref 7.350–7.450)
PO2 ART: 89.4 mmHg (ref 80.0–100.0)
PO2 ART: 74.9 mmHg — AB (ref 80.0–100.0)
Patient temperature: 37
Patient temperature: 37
RATE: 22 resp/min
RATE: 14 resp/min
TCO2: 17.4 mmol/L (ref 0–100)
VT: 560 mL
pCO2 arterial: 23.7 mmHg — ABNORMAL LOW (ref 35.0–45.0)
pH, Arterial: 7.485 — ABNORMAL HIGH (ref 7.350–7.450)
pH, Arterial: 7.462 — ABNORMAL HIGH (ref 7.350–7.450)
pO2, Arterial: 98.4 mmHg (ref 80.0–100.0)
pO2, Arterial: 134 mmHg — ABNORMAL HIGH (ref 80.0–100.0)

## 2016-04-03 LAB — BASIC METABOLIC PANEL
ANION GAP: 7 (ref 5–15)
BUN: 15 mg/dL (ref 6–20)
CALCIUM: 8.5 mg/dL — AB (ref 8.9–10.3)
CO2: 31 mmol/L (ref 22–32)
Chloride: 101 mmol/L (ref 101–111)
Creatinine, Ser: 0.87 mg/dL (ref 0.61–1.24)
GFR calc Af Amer: >60 mL/min (ref >60)
GFR calc non Af Amer: >60 mL/min (ref >60)
GLUCOSE: 145 mg/dL — AB (ref 65–99)
Potassium: 3.7 mmol/L (ref 3.5–5.1)
Sodium: 139 mmol/L (ref 135–145)

## 2016-04-03 LAB — MRSA PCR SCREENING: MRSA by PCR: NEGATIVE

## 2016-04-03 LAB — LACTIC ACID, PLASMA: LACTIC ACID, VENOUS: 1.2 mmol/L (ref 0.5–2.0)

## 2016-04-03 LAB — URINE MICROSCOPIC-ADD ON
BACTERIA UA: NONE SEEN
Squamous Epithelial / LPF: NONE SEEN
WBC, UA: NONE SEEN WBC/hpf (ref 0–5)

## 2016-04-03 LAB — TROPONIN I
Troponin I: <0.03 ng/mL (ref <0.031)
Troponin I: <0.03 ng/mL (ref <0.031)

## 2016-04-03 LAB — CK: Total CK: 223 U/L (ref 49–397)

## 2016-04-03 LAB — CBG MONITORING, ED: Glucose-Capillary: 96 mg/dL (ref 65–99)

## 2016-04-03 LAB — AMMONIA: AMMONIA: 26 umol/L (ref 9–35)

## 2016-04-03 LAB — BRAIN NATRIURETIC PEPTIDE: B Natriuretic Peptide: 173 pg/mL — ABNORMAL HIGH (ref 0.0–100.0)

## 2016-04-03 MED ORDER — PANTOPRAZOLE SODIUM 40 MG IV SOLR
40.0000 mg | Freq: Every day | INTRAVENOUS | Status: DC
Start: 2016-04-03 — End: 2016-04-06
  Administered 2016-04-03 – 2016-04-05 (×3): 40 mg via INTRAVENOUS
  Filled 2016-04-03 (×3): qty 40

## 2016-04-03 MED ORDER — FENTANYL CITRATE (PF) 100 MCG/2ML IJ SOLN: 50.0000 ug | INTRAMUSCULAR | Status: DC | PRN

## 2016-04-03 MED ORDER — ASPIRIN 81 MG PO CHEW
324.0000 mg | CHEWABLE_TABLET | ORAL | Status: AC
  Administered 2016-04-03: 324 mg via ORAL
  Filled 2016-04-03: qty 4

## 2016-04-03 MED ORDER — MIDAZOLAM HCL 50 MG/10ML IJ SOLN
INTRAMUSCULAR | Status: AC
  Filled 2016-04-03: qty 1

## 2016-04-03 MED ORDER — CLOPIDOGREL BISULFATE 75 MG PO TABS: 75.0000 mg | ORAL_TABLET | Freq: Every day | ORAL | Status: DC

## 2016-04-03 MED ORDER — GABAPENTIN 300 MG PO CAPS
300.0000 mg | ORAL_CAPSULE | Freq: Three times a day (TID) | ORAL | Status: DC
  Administered 2016-04-03 – 2016-04-06 (×7): 300 mg via ORAL
  Filled 2016-04-03 (×7): qty 1

## 2016-04-03 MED ORDER — ROCURONIUM BROMIDE 50 MG/5ML IV SOLN
INTRAVENOUS | Status: AC | PRN
  Administered 2016-04-03: 100 mg via INTRAVENOUS

## 2016-04-03 MED ORDER — DOCUSATE SODIUM 50 MG/5ML PO LIQD
100.0000 mg | Freq: Two times a day (BID) | ORAL | Status: DC | PRN
  Filled 2016-04-03: qty 10

## 2016-04-03 MED ORDER — SODIUM CHLORIDE 0.9 % IV SOLN
1250.0000 mg | Freq: Two times a day (BID) | INTRAVENOUS | Status: DC
  Administered 2016-04-04 – 2016-04-05 (×3): 1250 mg via INTRAVENOUS
  Filled 2016-04-03 (×6): qty 1250

## 2016-04-03 MED ORDER — NALOXONE HCL 2 MG/2ML IJ SOSY
PREFILLED_SYRINGE | INTRAMUSCULAR | Status: AC
  Filled 2016-04-03: qty 2

## 2016-04-03 MED ORDER — ASPIRIN 300 MG RE SUPP: 300.0000 mg | RECTAL | Status: AC

## 2016-04-03 MED ORDER — ONDANSETRON HCL 4 MG/2ML IJ SOLN: 4.0000 mg | Freq: Four times a day (QID) | INTRAMUSCULAR | Status: DC | PRN

## 2016-04-03 MED ORDER — IOHEXOL 350 MG/ML SOLN
150.0000 mL | Freq: Once | INTRAVENOUS | Status: AC | PRN
  Administered 2016-04-03: 100 mL via INTRAVENOUS

## 2016-04-03 MED ORDER — SODIUM CHLORIDE 0.9 % IV BOLUS (SEPSIS)
500.0000 mL | Freq: Once | INTRAVENOUS | Status: AC
  Administered 2016-04-03: 500 mL via INTRAVENOUS

## 2016-04-03 MED ORDER — PROPOFOL 1000 MG/100ML IV EMUL
5.0000 ug/kg/min | INTRAVENOUS | Status: DC
  Administered 2016-04-03: 20 ug/kg/min via INTRAVENOUS

## 2016-04-03 MED ORDER — SODIUM CHLORIDE 0.9 % IV SOLN: 250.0000 mL | INTRAVENOUS | Status: DC | PRN

## 2016-04-03 MED ORDER — PROPOFOL 1000 MG/100ML IV EMUL
INTRAVENOUS | Status: AC | PRN
  Administered 2016-04-03: 20 ug/kg/min via INTRAVENOUS

## 2016-04-03 MED ORDER — NALOXONE HCL 0.4 MG/ML IJ SOLN
0.4000 mg | Freq: Once | INTRAMUSCULAR | Status: AC
  Administered 2016-04-03: 0.4 mg via INTRAVENOUS
  Filled 2016-04-03: qty 1

## 2016-04-03 MED ORDER — PROPOFOL 1000 MG/100ML IV EMUL
5.0000 ug/kg/min | INTRAVENOUS | Status: DC
  Administered 2016-04-03: 40 ug/kg/min via INTRAVENOUS

## 2016-04-03 MED ORDER — VANCOMYCIN HCL IN DEXTROSE 1-5 GM/200ML-% IV SOLN: 1000.0000 mg | Freq: Once | INTRAVENOUS | Status: DC

## 2016-04-03 MED ORDER — PRAVASTATIN SODIUM 40 MG PO TABS
40.0000 mg | ORAL_TABLET | Freq: Every day | ORAL | Status: DC
  Administered 2016-04-03 – 2016-04-05 (×2): 40 mg via ORAL
  Filled 2016-04-03 (×2): qty 1

## 2016-04-03 MED ORDER — SODIUM CHLORIDE 0.9 % IV SOLN
0.0000 mg/h | INTRAVENOUS | Status: DC
  Administered 2016-04-03: 2 mg/h via INTRAVENOUS
  Administered 2016-04-04: 3 mg/h via INTRAVENOUS
  Filled 2016-04-03: qty 10

## 2016-04-03 MED ORDER — FENTANYL CITRATE (PF) 100 MCG/2ML IJ SOLN
50.0000 ug | INTRAMUSCULAR | Status: DC | PRN
  Administered 2016-04-03 – 2016-04-05 (×5): 50 ug via INTRAVENOUS
  Filled 2016-04-03 (×5): qty 2

## 2016-04-03 MED ORDER — SODIUM CHLORIDE 0.9 % IV SOLN
INTRAVENOUS | Status: DC
Start: 2016-04-03 — End: 2016-04-05
  Administered 2016-04-03 – 2016-04-04 (×3): via INTRAVENOUS

## 2016-04-03 MED ORDER — ACETAMINOPHEN 325 MG PO TABS
650.0000 mg | ORAL_TABLET | ORAL | Status: DC | PRN
  Administered 2016-04-04: 650 mg via ORAL
  Filled 2016-04-03: qty 2

## 2016-04-03 MED ORDER — PROPOFOL 1000 MG/100ML IV EMUL
INTRAVENOUS | Status: AC
  Filled 2016-04-03: qty 100

## 2016-04-03 MED ORDER — SODIUM CHLORIDE 0.9 % IV BOLUS (SEPSIS)
1000.0000 mL | Freq: Once | INTRAVENOUS | Status: DC
  Administered 2016-04-03: 1000 mL via INTRAVENOUS

## 2016-04-03 MED ORDER — NALOXONE HCL 2 MG/2ML IJ SOSY
PREFILLED_SYRINGE | INTRAMUSCULAR | Status: AC | PRN
  Administered 2016-04-03: 0.4 mg via INTRAVENOUS
  Administered 2016-04-03: 2 mg via INTRAVENOUS

## 2016-04-03 MED ORDER — SODIUM CHLORIDE 0.9 % IV BOLUS (SEPSIS): 1000.0000 mL | Freq: Once | INTRAVENOUS | Status: DC

## 2016-04-03 MED ORDER — TAMSULOSIN HCL 0.4 MG PO CAPS
0.4000 mg | ORAL_CAPSULE | Freq: Every day | ORAL | Status: DC
  Administered 2016-04-04 – 2016-04-06 (×3): 0.4 mg via ORAL
  Filled 2016-04-03 (×3): qty 1

## 2016-04-03 MED ORDER — CHLORHEXIDINE GLUCONATE 0.12% ORAL RINSE (MEDLINE KIT)
15.0000 mL | Freq: Two times a day (BID) | OROMUCOSAL | Status: DC
  Administered 2016-04-03 – 2016-04-06 (×6): 15 mL via OROMUCOSAL

## 2016-04-03 MED ORDER — ETOMIDATE 2 MG/ML IV SOLN
INTRAVENOUS | Status: AC | PRN
  Administered 2016-04-03: 20 mg via INTRAVENOUS

## 2016-04-03 MED ORDER — PROPOFOL 1000 MG/100ML IV EMUL
INTRAVENOUS | Status: AC
  Administered 2016-04-03: 20 ug/kg/min via INTRAVENOUS
  Filled 2016-04-03: qty 100

## 2016-04-03 MED ORDER — IPRATROPIUM-ALBUTEROL 0.5-2.5 (3) MG/3ML IN SOLN
3.0000 mL | RESPIRATORY_TRACT | Status: DC
  Administered 2016-04-03 – 2016-04-04 (×8): 3 mL via RESPIRATORY_TRACT
  Filled 2016-04-03 (×7): qty 3

## 2016-04-03 MED ORDER — LEVOTHYROXINE SODIUM 25 MCG PO TABS
25.0000 ug | ORAL_TABLET | Freq: Every day | ORAL | Status: DC
  Administered 2016-04-04 – 2016-04-06 (×3): 25 ug via ORAL
  Filled 2016-04-03 (×3): qty 1

## 2016-04-03 MED ORDER — NALOXONE HCL 0.4 MG/ML IJ SOLN
INTRAMUSCULAR | Status: AC
  Filled 2016-04-03: qty 1

## 2016-04-03 MED ORDER — PIPERACILLIN-TAZOBACTAM 3.375 G IVPB 30 MIN
3.3750 g | Freq: Once | INTRAVENOUS | Status: AC
  Administered 2016-04-03: 3.375 g via INTRAVENOUS
  Filled 2016-04-03: qty 50

## 2016-04-03 MED ORDER — MIDAZOLAM BOLUS VIA INFUSION
1.0000 mg | INTRAVENOUS | Status: DC | PRN
  Filled 2016-04-03: qty 2

## 2016-04-03 MED ORDER — SODIUM CHLORIDE 0.9 % IV SOLN
INTRAVENOUS | Status: DC | PRN
  Administered 2016-04-03: 1000 mL via INTRAVENOUS

## 2016-04-03 MED ORDER — VANCOMYCIN HCL 10 G IV SOLR
1500.0000 mg | Freq: Once | INTRAVENOUS | Status: AC
  Administered 2016-04-03: 1500 mg via INTRAVENOUS
  Filled 2016-04-03: qty 1500

## 2016-04-03 MED ORDER — PIPERACILLIN-TAZOBACTAM 3.375 G IVPB
3.3750 g | Freq: Three times a day (TID) | INTRAVENOUS | Status: DC
  Administered 2016-04-03 – 2016-04-06 (×8): 3.375 g via INTRAVENOUS
  Filled 2016-04-03 (×8): qty 50

## 2016-04-03 MED ORDER — ANTISEPTIC ORAL RINSE SOLUTION (CORINZ)
7.0000 mL | Freq: Four times a day (QID) | OROMUCOSAL | Status: DC
  Administered 2016-04-04 – 2016-04-06 (×10): 7 mL via OROMUCOSAL

## 2016-04-03 MED ORDER — ENOXAPARIN SODIUM 40 MG/0.4ML ~~LOC~~ SOLN
40.0000 mg | SUBCUTANEOUS | Status: DC
  Administered 2016-04-03 – 2016-04-05 (×3): 40 mg via SUBCUTANEOUS
  Filled 2016-04-03 (×3): qty 0.4

## 2016-04-03 NOTE — ED Notes (Signed)
Patient brought in via EMS. Alert with some confusion (hx of dementia). Patient brought in for shortness of breath. Per EMS patient 79-80% on room air upon there arrival. Patient given albuterol neb treatment prior to arrival bringing O2 sat to 98% per EMS personal. Patient denies any COPD hx but has extensive cardiac hx. Denies any chest pain. O2 sat on room air 80% when transferred onto stretcher.

## 2016-04-03 NOTE — ED Notes (Signed)
Fentanyl patch removed per MD.

## 2016-04-03 NOTE — Progress Notes (Signed)
Pharmacy Antibiotic Note  Dustin [name] is a 82 y.o. male admitted on 04/03/2016 with sepsis.  Pharmacy has been consulted for Vancomycin and zosyn dosing.  Plan: Vancomycin 1250mg  IV every 12 hours.  Goal trough 15-20 mcg/mL. Zosyn 3.375g IV q8h (4 hour infusion).  Height: 5\' 10"  (177.8 cm) Weight: 205 lb 0.4 oz (93 kg) IBW/kg (Calculated) : 73  Temp (24hrs), Avg:99 F (37.2 C), Min:97.3 F (36.3 C), Max:100 F (37.8 C)   Recent Labs Lab 04/03/16 1006 04/03/16 1328  WBC 7.6  --   CREATININE 0.87  --   LATICACIDVEN  --  1.2    Estimated Creatinine Clearance: 78.9 mL/min (by C-G formula based on Cr of 0.87).    No Known Allergies  Antimicrobials this admission: Vancomycin 4/8 >> Zosyn 4/8 >>   Microbiology results: 4/8 BCx: pending 4/8 UCx: pending 4/8 Sputum: to be collected 4/8 MRSA PCR: pending  Thank you for allowing pharmacy to be a part of this patient's care.  Isac Sarna, BS Pharm D, [place] Clinical Pharmacist Pager [phone] 04/03/2016 6:10 PM

## 2016-04-03 NOTE — ED Provider Notes (Signed)
CSN: [id number]     Arrival date & time 04/03/16  0947 History   First MD Initiated Contact with Patient 04/03/16 [phone]     Chief Complaint  Patient presents with  . Shortness of Breath   Level V Caveat - dementia.  (Consider location/radiation/quality/duration/timing/severity/associated sxs/prior Treatment) The history is provided by the patient and the EMS personnel. The history is limited by the condition of the patient.   Dustin [name] is a 82 y.o. male with a history of Alzheimers, CAD with cabg history presenting with shortness of breath.  He arrives by EMS and his oxygen sats were 80% on room air, briefly responding to albuterol neb tx, but now back down to 80% unless on oxygen.  Pt is a poor history giver but denies sob, denies fevers, chest pain or other complaint.  There is currently no family at bedside.    Past Medical History  Diagnosis Date  . Arthritis   . Coronary atherosclerosis of native coronary artery     a. Multivessel s/p CABG 1996. b. s/p DES x 2 SVG to PDA 8/12 with distal disease managed medically.  . Mixed hyperlipidemia   . Essential hypertension, benign   . MI (myocardial infarction) ([place])   . Hematuria   . OSA (obstructive sleep apnea)   . Anxiety   . OA (osteoarthritis)   . DDD (degenerative disc disease)     Chronic back pain  . Alzheimer disease   . Enlarged prostate   . Hypothyroidism   . Atrophic gastritis     a. By EGD 02/2013.   Past Surgical History  Procedure Laterality Date  . Coronary artery bypass graft  1996    LIMA to LAD, SVG to D2, SVG to PDA, SVG to OM1 and OM2  . Hernia repair    . Coronary angioplasty with stent placement    . Colonoscopy  08/03/2004    Jenkins-numerous large diverticula in the descending, transverse, descending, and sigmoid colon. Otherwise normal exam.  . Colonoscopy  07/12/2012    RMR: External hemorrhoidal tag; multiple rectal and colonic polyps removed and/or treated as described above. Pancolonic  diverticulosis. Bx-tubular adenomas, rectal hyperplastic polyp. next colonoscopy in 06/2015.  [name] Esophagogastroduodenoscopy N/A 03/16/2013    Procedure: ESOPHAGOGASTRODUODENOSCOPY (EGD);  Surgeon: Daneil Dolin, MD;  Location: [facility];  Service: Endoscopy;  Laterality: N/A;  12:00-moved to [place] notified pt  . Esophagogastroduodenoscopy N/A 03/06/2013    Procedure: ESOPHAGOGASTRODUODENOSCOPY (EGD);  Surgeon: Daneil Dolin, MD;  Location: [facility];  Service: Endoscopy;  Laterality: N/A;   Family History  Problem Relation Age of Onset  . Heart disease    . Colon cancer Neg Hx    Social History  Substance Use Topics  . Smoking status: Former Smoker -- 2.00 packs/day for 40 years    Types: Cigarettes    Quit date: 12/27/1994  . Smokeless tobacco: Never Used  . Alcohol Use: No    Review of Systems  Unable to perform ROS: Dementia  Respiratory: Positive for shortness of breath.       Allergies  Review of patient's allergies indicates no known allergies.  Home Medications   Prior to Admission medications   Medication Sig Start Date End Date Taking? Authorizing Provider  ALPRAZolam Duanne Moron) 1 MG tablet Take 1 tablet by mouth 4 (four) times daily. 03/30/16  Yes Historical Provider, MD  amLODipine (NORVASC) 10 MG tablet Take 1 tablet by mouth daily. 03/05/16  Yes Historical Provider, MD  carvedilol (COREG) 3.125 MG tablet Take 1 tablet by mouth 2 (two) times daily. 08/06/14  Yes Historical Provider, MD  clopidogrel (PLAVIX) 75 MG tablet Take 1 tablet (75 mg total) by mouth daily. 01/29/15  Yes Hosie Poisson, MD  donepezil (ARICEPT) 10 MG tablet Take 1 tablet by mouth At bedtime. 07/01/12  Yes Historical Provider, MD  escitalopram (LEXAPRO) 10 MG tablet Take 10 mg by mouth daily.   Yes Historical Provider, MD  fentaNYL (DURAGESIC - DOSED MCG/HR) 100 MCG/HR Place 1 patch (100 mcg total) onto the skin every 3 (three) days. 08/31/14  Yes Tiffany L Reed, DO  gabapentin (NEURONTIN) 300  MG capsule Take 1 capsule by mouth 3 (three) times daily. 01/08/16  Yes Historical Provider, MD  hydrochlorothiazide (MICROZIDE) 12.5 MG capsule Take 1 capsule by mouth daily. 03/05/16  Yes Historical Provider, MD  levothyroxine (SYNTHROID, LEVOTHROID) 25 MCG tablet Take 25 mcg by mouth daily before breakfast.   Yes Historical Provider, MD  memantine (NAMENDA) 10 MG tablet Take 1 tablet by mouth 2 (two) times daily. 03/05/16  Yes Historical Provider, MD  NITROSTAT 0.4 MG SL tablet PLACE ONE TABLET UNDER TONGUE EVERY 5 MIN UP TO 3 DOSES AS NEEDED FORCHEST PAIN. 08/12/15  Yes Lendon Colonel, NP  oxyCODONE (ROXICODONE) 15 MG immediate release tablet Take 15 mg by mouth every 4 (four) hours as needed for pain Bennie Pierini of 8 tablets daily as needed every 4 to 6 hours for pain*).   Yes Historical Provider, MD  pravastatin (PRAVACHOL) 40 MG tablet TAKE (1) TABLET BY MOUTH AT BEDTIME. 10/03/15  Yes Lendon Colonel, NP  tamsulosin (FLOMAX) 0.4 MG CAPS capsule Take 1 capsule by mouth daily. 02/23/16  Yes Historical Provider, MD   BP 136/57 mmHg  Pulse 66  Temp(Src) 99 F (37.2 C) (Core (Comment))  Resp 20  Ht 5\' 10"  (1.778 m)  Wt 85.3 kg  BMI 26.98 kg/m2  SpO2 100% Physical Exam  Constitutional: He appears well-developed and well-nourished. No distress.  HENT:  Head: Normocephalic and atraumatic.  Mouth/Throat: Oropharynx is clear and moist.  Eyes: Conjunctivae are normal.  Neck: Neck supple.  Cardiovascular: Normal rate, regular rhythm, normal heart sounds and intact distal pulses.   Pulmonary/Chest: Effort normal. No tachypnea. He has decreased breath sounds. He has no wheezes. He has no rhonchi. He has rales.  Abdominal: Soft. Bowel sounds are normal. There is no tenderness.  Musculoskeletal: Normal range of motion.  Neurological: He is alert. He is disoriented.  Continually repeats questions asked.  Skin: Skin is warm and dry.  Psychiatric: He has a normal mood and affect.  Nursing note and  vitals reviewed.   ED Course  Procedures (including critical care time) Labs Review Labs Reviewed  BASIC METABOLIC PANEL - Abnormal; Notable for the following:    Glucose, Bld 145 (*)    Calcium 8.5 (*)    All other components within normal limits  BRAIN NATRIURETIC PEPTIDE - Abnormal; Notable for the following:    B Natriuretic Peptide 173.0 (*)    All other components within normal limits  CBC WITH DIFFERENTIAL/PLATELET - Abnormal; Notable for the following:    RBC 4.10 (*)    Hemoglobin 12.5 (*)    HCT 38.8 (*)    All other components within normal limits  BLOOD GAS, ARTERIAL - Abnormal; Notable for the following:    pCO2 arterial 48.1 (*)    Bicarbonate 26.0 (*)    Acid-Base Excess 2.4 (*)    All  other components within normal limits  URINALYSIS, ROUTINE W REFLEX MICROSCOPIC (NOT AT [facility]) - Abnormal; Notable for the following:    Hgb urine dipstick SMALL (*)    All other components within normal limits  BLOOD GAS, ARTERIAL - Abnormal; Notable for the following:    pH, Arterial 7.60 (*)    pCO2 arterial 23.7 (*)    pO2, Arterial 134 (*)    Bicarbonate 27.3 (*)    All other components within normal limits  CULTURE, BLOOD (ROUTINE X 2)  CULTURE, BLOOD (ROUTINE X 2)  URINE CULTURE  TROPONIN I  AMMONIA  CK  LACTIC ACID, PLASMA  URINE MICROSCOPIC-ADD ON  TROPONIN I  BLOOD GAS, ARTERIAL  CBG MONITORING, ED    Imaging Review Ct Head Wo Contrast  04/03/2016  CLINICAL DATA:  Confusion EXAM: CT HEAD WITHOUT CONTRAST TECHNIQUE: Contiguous axial images were obtained from the base of the skull through the vertex without intravenous contrast. COMPARISON:  08/30/2014 FINDINGS: Bony calvarium is intact. Soft tissue density is noted within the nasal passages consistent with mucus. The paranasal sinuses are within normal limits. Diffuse atrophic changes are seen. No findings to suggest acute hemorrhage, acute infarction or space-occupying mass lesion are noted. IMPRESSION: Chronic  atrophic changes without acute intracranial abnormality. Increased soft tissue density within the nasal passages likely related to mucus. Electronically Signed   By: Inez Catalina M.D.   On: 04/03/2016 14:19   Ct Angio Chest Pe W/cm &/or Wo Cm  04/03/2016  CLINICAL DATA:  Confusion and hypoxia EXAM: CT ANGIOGRAPHY CHEST WITH CONTRAST TECHNIQUE: Multidetector CT imaging of the chest was performed using the standard protocol during bolus administration of intravenous contrast. Multiplanar CT image reconstructions and MIPs were obtained to evaluate the vascular anatomy. CONTRAST:  175mL OMNIPAQUE IOHEXOL 350 MG/ML SOLN COMPARISON:  04/03/2016 FINDINGS: Lungs are well aerated bilaterally with dependent atelectasis in the left lung and similar changes on the right lung with more marked atelectasis in the right lower lobe. This may represent some early infiltrate. No parenchymal nodule or sizable effusion is seen. The thoracic inlet is within normal limits. An endotracheal tube and nasogastric catheter are noted in satisfactory position. The thoracic aorta shows changes of prior coronary bypass grafting. Calcific changes are noted without aneurysmal dilatation or dissection. The pulmonary artery as visualized shows a normal branching pattern. No filling defects to suggest pulmonary emboli are seen. No hilar or mediastinal adenopathy is seen. Heavy coronary calcifications are noted. The visualized upper abdomen is within normal limits. The osseous structures show degenerative change of the thoracic spine. Review of the MIP images confirms the above findings. IMPRESSION: No evidence of pulmonary emboli. Patchy bilateral atelectatic changes worse in the right lower lobe. Tubes and lines as described. Electronically Signed   By: Inez Catalina M.D.   On: 04/03/2016 14:22   Dg Chest Portable 1 View  04/03/2016  CLINICAL DATA:  82 year old male with a history of intubation. EXAM: PORTABLE CHEST 1 VIEW COMPARISON:  04/03/2016  FINDINGS: Interval placement of endotracheal tube, which terminates suitably above the carina, 3.7 cm. Interval placement of gastric tube terminating out of the field of view. Surgical changes of median sternotomy and CABG. Low lung volumes with likely basilar atelectasis. No pneumothorax.  Chronic interstitial opacities. IMPRESSION: Interval placement of endotracheal tube terminating suitably above the carina. Interval placement of gastric tube turning out of the field of view. Low lung volumes with likely basilar atelectasis. Signed, Dulcy Fanny. Earleen Newport DO [facility]  [facility] Electronically Signed   By: Corrie Mckusick D.O.   On: 04/03/2016 14:07   Dg Chest Port 1 View  04/03/2016  CLINICAL DATA:  82 year old presenting with acute onset of shortness of breath. Hypoxia upon arrival to the emergency department. Prior CABG. EXAM: PORTABLE CHEST 1 VIEW COMPARISON:  01/29/2015 and earlier, including CTA chest 08/31/2014. FINDINGS: Prior sternotomy for CABG. Cardiac silhouette moderately enlarged, unchanged. Mild pulmonary venous hypertension without overt edema. Prominent right paracardiac fat pad as noted previously. Linear scarring at the right lung base at the site of prior pneumonia. Lungs otherwise clear. No confluent airspace consolidation. No visible pleural effusions. IMPRESSION: No acute cardiopulmonary disease. Stable moderate cardiomegaly. Mild pulmonary venous hypertension without overt edema. Scarring in the right lung base at the site of prior pneumonia. Electronically Signed   By: Evangeline Dakin M.D.   On: 04/03/2016 10:39   I have personally reviewed and evaluated these images and lab results as part of my medical decision-making.   EKG Interpretation   Date/Time:  Saturday April 03 2016 13:13:35 EDT Ventricular Rate:  87 PR Interval:  227 QRS Duration: 140 QT Interval:  414 QTC Calculation: 498 R Axis:   15 Text Interpretation:  Sinus  rhythm Prolonged PR interval IVCD, consider  atypical LBBB Nonspecific ST and T wave abnormality Confirmed by Wyvonnia Dusky   MD, STEPHEN [phone]) on 04/03/2016 1:20:13 PM      MDM   Final diagnoses:  Acute respiratory failure, unspecified whether with hypoxia or hypercapnia (HCC)  Narcotic overdose, accidental or unintentional, initial encounter    Wife and son now at bedside, gives h/o increased confusion since yesterday which he has had in the past, wife stating he tends to becomes more confused the day after changing his fentanyl patch (was changed 2 days ago.  Also states he takes oxycodone for prn breakthough pain.  She controls this, but states sometimes he will take 2-3 with him and place in his ntg bottle, so may have taken more than normal without her knowledge.  Fentanyl patch was removed.  At this time review of labs, imaging unenlightening,  Ct head ordered given worsened confusion, CT angio chest as wife states he generally is sedentary.  Son at bedside endorses personal h/o dvt.     Dr. Wyvonnia Dusky in to see pt, added narcan.  Pt had immediate response, yelling out in pain, agitated, then became unresponsive.  Pt moved to room 1.  Intubation planned as not protecting airway well.  Additional labs ordered.  Care assumed by Dr Wyvonnia Dusky.    Evalee Jefferson, PA-C 04/03/16 [place], MD 04/03/16 [phone]

## 2016-04-03 NOTE — H&P (Signed)
[name] History and Physical  DRAVYN REDWOOD [id number] DOB: 1934/01/27 DOA: 04/03/2016  Referring physician: Dr. Wyvonnia Dusky, ER PCP: Glo Herring., MD   Chief Complaint: hypoxia  HPI: Dustin [name] is a 82 y.o. male who is brought to ED by EMS. Patient is currently intubated and sedated and no family is available. History is obtained from the medical record and ER reports. Patient was brought to ED today due to development of shortness of breath. He was seen by EMS and was found to have oxygen saturations in the 80's on room air. He received one neb treatment with minimal improvement. On arrival to ED oxygen was applied with improvement of his hypoxia, but patient was initially conversing and awake and through the course of his ER time, became increasingly somnolent. He received one dose of narcan with initial improvement in his mental status, but within a few minutes, he became obtunded again. A second dose of narcan was administered without significant benefit. At that point, there were concerns that he was not protecting his airway, so he was intubated for airway protection.   Review of Systems:  Pertinent positives as per HPI, otherwise negative  Past Medical History  Diagnosis Date  . Arthritis   . Coronary atherosclerosis of native coronary artery     a. Multivessel s/p CABG 1996. b. s/p DES x 2 SVG to PDA 8/12 with distal disease managed medically.  . Mixed hyperlipidemia   . Essential hypertension, benign   . MI (myocardial infarction) ([place])   . Hematuria   . OSA (obstructive sleep apnea)   . Anxiety   . OA (osteoarthritis)   . DDD (degenerative disc disease)     Chronic back pain  . Alzheimer disease   . Enlarged prostate   . Hypothyroidism   . Atrophic gastritis     a. By EGD 02/2013.   Past Surgical History  Procedure Laterality Date  . Coronary artery bypass graft  1996    LIMA to LAD, SVG to D2, SVG to PDA, SVG to OM1 and OM2  . Hernia repair      . Coronary angioplasty with stent placement    . Colonoscopy  08/03/2004    Jenkins-numerous large diverticula in the descending, transverse, descending, and sigmoid colon. Otherwise normal exam.  . Colonoscopy  07/12/2012    RMR: External hemorrhoidal tag; multiple rectal and colonic polyps removed and/or treated as described above. Pancolonic diverticulosis. Bx-tubular adenomas, rectal hyperplastic polyp. next colonoscopy in 06/2015.  [name] Esophagogastroduodenoscopy N/A 03/16/2013    Procedure: ESOPHAGOGASTRODUODENOSCOPY (EGD);  Surgeon: Daneil Dolin, MD;  Location: [facility];  Service: Endoscopy;  Laterality: N/A;  12:00-moved to [place] notified pt  . Esophagogastroduodenoscopy N/A 03/06/2013    Procedure: ESOPHAGOGASTRODUODENOSCOPY (EGD);  Surgeon: Daneil Dolin, MD;  Location: [facility];  Service: Endoscopy;  Laterality: N/A;   Social History:  reports that he quit smoking about 21 years ago. His smoking use included Cigarettes. He has a 80 pack-year smoking history. He has never used smokeless tobacco. He reports that he does not drink alcohol or use illicit drugs.  No Known Allergies  Family History  Problem Relation Age of Onset  . Heart disease    . Colon cancer Neg Hx     Prior to Admission medications   Medication Sig Start Date End Date Taking? Authorizing Provider  ALPRAZolam Duanne Moron) 1 MG tablet Take 1 tablet by mouth 4 (four) times daily. 03/30/16  Yes Historical Provider, MD  amLODipine (NORVASC) 10 MG tablet Take 1 tablet by mouth daily. 03/05/16  Yes Historical Provider, MD  carvedilol (COREG) 3.125 MG tablet Take 1 tablet by mouth 2 (two) times daily. 08/06/14  Yes Historical Provider, MD  clopidogrel (PLAVIX) 75 MG tablet Take 1 tablet (75 mg total) by mouth daily. 01/29/15  Yes Hosie Poisson, MD  donepezil (ARICEPT) 10 MG tablet Take 1 tablet by mouth At bedtime. 07/01/12  Yes Historical Provider, MD  escitalopram (LEXAPRO) 10 MG tablet Take 10 mg by mouth daily.    Yes Historical Provider, MD  fentaNYL (DURAGESIC - DOSED MCG/HR) 100 MCG/HR Place 1 patch (100 mcg total) onto the skin every 3 (three) days. 08/31/14  Yes Tiffany L Reed, DO  gabapentin (NEURONTIN) 300 MG capsule Take 1 capsule by mouth 3 (three) times daily. 01/08/16  Yes Historical Provider, MD  hydrochlorothiazide (MICROZIDE) 12.5 MG capsule Take 1 capsule by mouth daily. 03/05/16  Yes Historical Provider, MD  levothyroxine (SYNTHROID, LEVOTHROID) 25 MCG tablet Take 25 mcg by mouth daily before breakfast.   Yes Historical Provider, MD  memantine (NAMENDA) 10 MG tablet Take 1 tablet by mouth 2 (two) times daily. 03/05/16  Yes Historical Provider, MD  NITROSTAT 0.4 MG SL tablet PLACE ONE TABLET UNDER TONGUE EVERY 5 MIN UP TO 3 DOSES AS NEEDED FORCHEST PAIN. 08/12/15  Yes Lendon Colonel, NP  oxyCODONE (ROXICODONE) 15 MG immediate release tablet Take 15 mg by mouth every 4 (four) hours as needed for pain Bennie Pierini of 8 tablets daily as needed every 4 to 6 hours for pain*).   Yes Historical Provider, MD  pravastatin (PRAVACHOL) 40 MG tablet TAKE (1) TABLET BY MOUTH AT BEDTIME. 10/03/15  Yes Lendon Colonel, NP  tamsulosin (FLOMAX) 0.4 MG CAPS capsule Take 1 capsule by mouth daily. 02/23/16  Yes Historical Provider, MD   Physical Exam: Filed Vitals:   04/03/16 1600 04/03/16 1615 04/03/16 1704 04/03/16 1732  BP: 127/52 124/54    Pulse: 57 56    Temp: 99.5 F (37.5 C) 99.7 F (37.6 C)    TempSrc:      Resp: 15 21    Height:      Weight:      SpO2: 100% 100% 100% 100%    Wt Readings from Last 3 Encounters:  04/03/16 85.3 kg (188 lb 0.8 oz)  04/14/15 85.276 kg (188 lb)  01/27/15 86.183 kg (190 lb)    General:  Sedated on ventilator Eyes: Pupils are constricted, equal, normal lids, irises & conjunctiva ENT: grossly normal hearing, lips & tongue Neck: no LAD, masses or thyromegaly Cardiovascular: RRR, no m/r/g. 1+ LE edema. Telemetry: SR, no arrhythmias  Respiratory: bilateral wheezes Normal  respiratory effort. Abdomen: soft, ntnd, ventral hernia present that is reducible Skin: no rash or induration seen on limited exam Musculoskeletal: grossly normal tone BUE/BLE Psychiatric: grossly normal mood and affect, speech fluent and appropriate Neurologic: no facial asymmetry, appears to have occasional jerking movements in arms and legs          Labs on Admission:  Basic Metabolic Panel:  Recent Labs Lab 04/03/16 1006  NA 139  K 3.7  CL 101  CO2 31  GLUCOSE 145*  BUN 15  CREATININE 0.87  CALCIUM 8.5*   Liver Function Tests: No results for input(s): AST, ALT, ALKPHOS, BILITOT, PROT, ALBUMIN in the last 168 hours. No results for input(s): LIPASE, AMYLASE in the last 168 hours.  Recent Labs Lab 04/03/16 1328  AMMONIA 26   CBC:  Recent Labs Lab 04/03/16 1006  WBC 7.6  NEUTROABS 5.9  HGB 12.5*  HCT 38.8*  MCV 94.6  PLT 218   Cardiac Enzymes:  Recent Labs Lab 04/03/16 1006 04/03/16 1328 04/03/16 1459  CKTOTAL  --  223  --   TROPONINI <0.03  --  <0.03    BNP (last 3 results)  Recent Labs  04/03/16 1006  BNP 173.0*    ProBNP (last 3 results) No results for input(s): PROBNP in the last 8760 hours.  CBG:  Recent Labs Lab 04/03/16 1236  GLUCAP 96    Radiological Exams on Admission: Ct Head Wo Contrast  04/03/2016  CLINICAL DATA:  Confusion EXAM: CT HEAD WITHOUT CONTRAST TECHNIQUE: Contiguous axial images were obtained from the base of the skull through the vertex without intravenous contrast. COMPARISON:  08/30/2014 FINDINGS: Bony calvarium is intact. Soft tissue density is noted within the nasal passages consistent with mucus. The paranasal sinuses are within normal limits. Diffuse atrophic changes are seen. No findings to suggest acute hemorrhage, acute infarction or space-occupying mass lesion are noted. IMPRESSION: Chronic atrophic changes without acute intracranial abnormality. Increased soft tissue density within the nasal passages likely  related to mucus. Electronically Signed   By: Inez Catalina M.D.   On: 04/03/2016 14:19   Ct Angio Chest Pe W/cm &/or Wo Cm  04/03/2016  CLINICAL DATA:  Confusion and hypoxia EXAM: CT ANGIOGRAPHY CHEST WITH CONTRAST TECHNIQUE: Multidetector CT imaging of the chest was performed using the standard protocol during bolus administration of intravenous contrast. Multiplanar CT image reconstructions and MIPs were obtained to evaluate the vascular anatomy. CONTRAST:  124mL OMNIPAQUE IOHEXOL 350 MG/ML SOLN COMPARISON:  04/03/2016 FINDINGS: Lungs are well aerated bilaterally with dependent atelectasis in the left lung and similar changes on the right lung with more marked atelectasis in the right lower lobe. This may represent some early infiltrate. No parenchymal nodule or sizable effusion is seen. The thoracic inlet is within normal limits. An endotracheal tube and nasogastric catheter are noted in satisfactory position. The thoracic aorta shows changes of prior coronary bypass grafting. Calcific changes are noted without aneurysmal dilatation or dissection. The pulmonary artery as visualized shows a normal branching pattern. No filling defects to suggest pulmonary emboli are seen. No hilar or mediastinal adenopathy is seen. Heavy coronary calcifications are noted. The visualized upper abdomen is within normal limits. The osseous structures show degenerative change of the thoracic spine. Review of the MIP images confirms the above findings. IMPRESSION: No evidence of pulmonary emboli. Patchy bilateral atelectatic changes worse in the right lower lobe. Tubes and lines as described. Electronically Signed   By: Inez Catalina M.D.   On: 04/03/2016 14:22   Dg Chest Portable 1 View  04/03/2016  CLINICAL DATA:  82 year old male with a history of intubation. EXAM: PORTABLE CHEST 1 VIEW COMPARISON:  04/03/2016 FINDINGS: Interval placement of endotracheal tube, which terminates suitably above the carina, 3.7 cm. Interval placement  of gastric tube terminating out of the field of view. Surgical changes of median sternotomy and CABG. Low lung volumes with likely basilar atelectasis. No pneumothorax.  Chronic interstitial opacities. IMPRESSION: Interval placement of endotracheal tube terminating suitably above the carina. Interval placement of gastric tube turning out of the field of view. Low lung volumes with likely basilar atelectasis. Signed, Dulcy Fanny. Earleen Newport, DO [facility] Electronically Signed   By: Corrie Mckusick D.O.   On: 04/03/2016 14:07   Dg Chest Port 1 View  04/03/2016  CLINICAL DATA:  82 year old presenting with acute onset of shortness of breath. Hypoxia upon arrival to the emergency department. Prior CABG. EXAM: PORTABLE CHEST 1 VIEW COMPARISON:  01/29/2015 and earlier, including CTA chest 08/31/2014. FINDINGS: Prior sternotomy for CABG. Cardiac silhouette moderately enlarged, unchanged. Mild pulmonary venous hypertension without overt edema. Prominent right paracardiac fat pad as noted previously. Linear scarring at the right lung base at the site of prior pneumonia. Lungs otherwise clear. No confluent airspace consolidation. No visible pleural effusions. IMPRESSION: No acute cardiopulmonary disease. Stable moderate cardiomegaly. Mild pulmonary venous hypertension without overt edema. Scarring in the right lung base at the site of prior pneumonia. Electronically Signed   By: Evangeline Dakin M.D.   On: 04/03/2016 10:39    EKG: Independently reviewed. LBBB, some ST depression in the inferior leads  Assessment/Plan Active Problems:   Hyperlipidemia   ALZHEIMERS DISEASE   HYPERTENSION, BENIGN   Acute respiratory failure (HCC)   Acute encephalopathy   Hypothyroidism   Aspiration pneumonia (HCC)   Dementia   Acute respiratory failure with hypoxia ([place])   1. Acute respiratory failure with hypoxia. Etiology is unclear. Patient may have developed aspiration  pneumonia. Will repeat chest xray in AM. He is currently on mechanical ventilation. Will consult pulmonology and perform daily weaning trials. Since he is wheezing, will start on bronchodilators 2. Possible pneumonia. Chest xray indicates possible pneumonia. Will treat with vancomycin and zosyn. Check blood cultures and sputum culture 3. Acute encephalopathy. Unclear etiology. Possibly related to narcotic. Urinalysis is unremarkable. Will check urine tox screen. CT head was negative. At this time, he appears to have some jerking movements in extremities, ?seizure. Will start on midazolam infusion and observe. Check EEG. Hold narcotics and monitor mental status 4. Hypothyroidism. Continue on synthroid 5. Dementia. On namenda and aricept. Will hold while intubated     Code Status: full code DVT Prophylaxis: lovenox Family Communication: no family present Disposition Plan: pending hospital course  Time spent: critical care: 31mins  Dustin [name],Dustin [name] [name] Pager [phone]

## 2016-04-03 NOTE — ED Notes (Signed)
EDP at bedside updating patient and family. 

## 2016-04-03 NOTE — ED Notes (Signed)
Phlebotomy at bedside for blood draw. Respiratory in room for ABG.

## 2016-04-03 NOTE — ED Notes (Signed)
PA Dustin [name] at bedside to evaluate patient.

## 2016-04-03 NOTE — Progress Notes (Addendum)
[place] Progress Note Patient Name: MR. RENNO DOB: 11-Jun-1934 MRN: [id number]   Date of Service  04/03/2016  HPI/Events of Note  Patient admited with decreased LOC. Severe delirium s/p Narcan >> intubated and ventilated. ABG on 40 %/PRVC 14/TV 560 /P 5 = 7.485/35.3/89.4  eICU Interventions  Will decrease PRVC to 12 and recheck ABG at 7:30 PM.      Intervention Category Evaluation Type: New Patient Evaluation  Lysle Dingwall 04/03/2016, 6:22 PM

## 2016-04-03 NOTE — ED Notes (Addendum)
Pt began yelling after narcan administration. Pt constantly yelling "get me out of here, give me my damn pills." Pt extremely agitated, but more alert after Narcan. MD aware.

## 2016-04-03 NOTE — ED Notes (Signed)
Pt's family arrived and is at bedside. 

## 2016-04-03 NOTE — ED Notes (Signed)
Pt placed on 2L nasal cannula, sats continued to remain at 80%. Increased to 6L. Sats 99%. Titrated down to 3L. Pt remains at 97-99%.

## 2016-04-03 NOTE — ED Notes (Signed)
Removed nasal cannula to clean pt's face. Sats immediately dropped into 80s. Placed back on 3L.

## 2016-04-04 ENCOUNTER — Inpatient Hospital Stay (HOSPITAL_COMMUNITY): Payer: PPO

## 2016-04-04 DIAGNOSIS — J9690 Respiratory failure, unspecified, unspecified whether with hypoxia or hypercapnia: Secondary | ICD-10-CM | POA: Diagnosis not present

## 2016-04-04 DIAGNOSIS — J9601 Acute respiratory failure with hypoxia: Secondary | ICD-10-CM | POA: Diagnosis not present

## 2016-04-04 DIAGNOSIS — I10 Essential (primary) hypertension: Secondary | ICD-10-CM

## 2016-04-04 DIAGNOSIS — J189 Pneumonia, unspecified organism: Secondary | ICD-10-CM | POA: Diagnosis not present

## 2016-04-04 LAB — BLOOD GAS, ARTERIAL
ACID-BASE EXCESS: 4.1 mmol/L — AB (ref 0.0–2.0)
Acid-Base Excess: 3.6 mmol/L — ABNORMAL HIGH (ref 0.0–2.0)
Bicarbonate: 27.9 mEq/L — ABNORMAL HIGH (ref 20.0–24.0)
Bicarbonate: 27.6 mEq/L — ABNORMAL HIGH (ref 20.0–24.0)
DRAWN BY: 317771
DRAWN BY: 234301
FIO2: 0.4
FIO2: 40
LHR: 12 {breaths}/min
Mode: POSITIVE
O2 Saturation: 96.4 %
O2 Saturation: 95.7 %
PCO2 ART: 37.3 mmHg (ref 35.0–45.0)
PEEP/CPAP: 5 cmH2O
PEEP/CPAP: 5 cmH2O
PH ART: 7.474 — AB (ref 7.350–7.450)
PO2 ART: 83.1 mmHg (ref 80.0–100.0)
PO2 ART: 80.3 mmHg (ref 80.0–100.0)
Pressure support: 5 cmH2O
TCO2: 20.4 mmol/L (ref 0–100)
VT: 560 mL
pCO2 arterial: 46.6 mmHg — ABNORMAL HIGH (ref 35.0–45.0)
pH, Arterial: 7.404 (ref 7.350–7.450)

## 2016-04-04 LAB — BASIC METABOLIC PANEL
Anion gap: 9 (ref 5–15)
BUN: 10 mg/dL (ref 6–20)
CALCIUM: 7.9 mg/dL — AB (ref 8.9–10.3)
CHLORIDE: 107 mmol/L (ref 101–111)
CO2: 25 mmol/L (ref 22–32)
CREATININE: 0.68 mg/dL (ref 0.61–1.24)
Glucose, Bld: 95 mg/dL (ref 65–99)
Potassium: 3.2 mmol/L — ABNORMAL LOW (ref 3.5–5.1)
SODIUM: 141 mmol/L (ref 135–145)

## 2016-04-04 LAB — CBC
HCT: 36.6 % — ABNORMAL LOW (ref 39.0–52.0)
HEMOGLOBIN: 12.3 g/dL — AB (ref 13.0–17.0)
MCH: 31.7 pg (ref 26.0–34.0)
MCHC: 33.6 g/dL (ref 30.0–36.0)
MCV: 94.3 fL (ref 78.0–100.0)
PLATELETS: 140 10*3/uL — AB (ref 150–400)
RBC: 3.88 MIL/uL — ABNORMAL LOW (ref 4.22–5.81)
RDW: 12.9 % (ref 11.5–15.5)
WBC: 8.8 10*3/uL (ref 4.0–10.5)

## 2016-04-04 LAB — GLUCOSE, CAPILLARY: GLUCOSE-CAPILLARY: 100 mg/dL — AB (ref 65–99)

## 2016-04-04 LAB — STREP PNEUMONIAE URINARY ANTIGEN: STREP PNEUMO URINARY ANTIGEN: NEGATIVE

## 2016-04-04 MED ORDER — MIDAZOLAM HCL 50 MG/10ML IJ SOLN
INTRAMUSCULAR | Status: AC
  Filled 2016-04-04: qty 1

## 2016-04-04 MED ORDER — POTASSIUM CHLORIDE 10 MEQ/100ML IV SOLN
10.0000 meq | INTRAVENOUS | Status: AC
  Administered 2016-04-04 (×4): 10 meq via INTRAVENOUS
  Filled 2016-04-04 (×4): qty 100

## 2016-04-04 MED ORDER — IPRATROPIUM-ALBUTEROL 0.5-2.5 (3) MG/3ML IN SOLN
3.0000 mL | Freq: Four times a day (QID) | RESPIRATORY_TRACT | Status: DC
  Administered 2016-04-05 (×2): 3 mL via RESPIRATORY_TRACT
  Filled 2016-04-04 (×2): qty 3

## 2016-04-04 NOTE — Procedures (Signed)
Extubation Procedure Note  Patient Details:   Name: Dustin [name] DOB: [birth year] MRN: [id number]   Airway Documentation:  RT performed weaning parameters on patient prior to extubation, patient performed -40 NIF, VC 1.7L and ABG results were also within normal range. RT extubated patient to 2L nasal cannula for SATs 94%, HR 52 and RR 17. No complications noted, RN at bedside. RT will continue to monitor and assess.   Evaluation  O2 sats: stable throughout Complications: No apparent complications Patient did tolerate procedure well. Bilateral Breath Sounds: Rhonchi   Yes  Lana Fish 04/04/2016, 10:56 AM

## 2016-04-04 NOTE — Progress Notes (Signed)
[name] PROGRESS NOTE  Dustin [name] VZD:[id number] DOB: 12-26-1934 DOA: 04/03/2016 PCP: Glo Herring., MD  Assessment/Plan: 1. Acute respiratory failure with hypoxia. Etiology is unclear. Patient may have developed aspiration pneumonia. He is currently on mechanical ventilation. Will consult pulmonology and perform daily weaning trials. Will continue bronchodilators. 2. Possible pneumonia. Repeat CXR reveals possible RLL PNA. Will continue vancomycin and zosyn. His WBC is wnl however he is mildly febrile at 100.6. Follow up blood and sputum culture 3. Acute encephalopathy. Unclear etiology. Possibly related to narcotic overdose. Urinalysis is unremarkable. U-tox is appropriately positive for benzodiazepines.  CT head was negative. He appeared to have some jerking movements in extremities on 4/8 so he was started on a midazolam infusion with improvement. Follow up EEG. Hold narcotics and monitor mental status 4. Hypokalemia, will replete.  5. Hypothyroidism. Continue on synthroid 6. Dementia. On namenda and aricept. Will hold while intubated   Code Status: full code DVT Prophylaxis: lovenox Family Communication:  No family at bedside.   Disposition Plan:  Discharge once improved.    Consultants:     Procedures:   Intubated 4/8>>  Antibiotics:  Vancomycin 4/8>>  HPI/Subjective: Shakes his head "yes" to feeling okay. Hx is limited due to patient being intubated and mildly sedated.   Objective: Filed Vitals:   04/04/16 0600 04/04/16 0615  BP: 132/65 135/111  Pulse: 64 63  Temp: 100.4 F (38 C) 100.6 F (38.1 C)  Resp: 13 15    Intake/Output Summary (Last 24 hours) at 04/04/16 0718 Last data filed at 04/04/16 0600  Gross per 24 hour  Intake 4223.68 ml  Output   2060 ml  Net 2163.68 ml   Filed Weights   04/03/16 1320 04/03/16 1743 04/04/16 0500  Weight: 85.3 kg (188 lb 0.8 oz) 93 kg (205 lb 0.4 oz) 93.4 kg (205 lb 14.6 oz)    Exam:  General: NAD,  looks comfortable. Intubated.  Cardiovascular: RRR, S1, S2  Respiratory: clear bilaterally, No wheezing, rales or rhonchi Abdomen: soft, non tender, no distention , bowel sounds normal Musculoskeletal: No edema b/l   Data Reviewed: Basic Metabolic Panel:  Recent Labs Lab 04/03/16 1006 04/04/16 0423  NA 139 141  K 3.7 3.2*  CL 101 107  CO2 31 25  GLUCOSE 145* 95  BUN 15 10  CREATININE 0.87 0.68  CALCIUM 8.5* 7.9*       Recent Labs Lab 04/03/16 1328  AMMONIA 26   CBC:  Recent Labs Lab 04/03/16 1006 04/04/16 0423  WBC 7.6 8.8  NEUTROABS 5.9  --   HGB 12.5* 12.3*  HCT 38.8* 36.6*  MCV 94.6 94.3  PLT 218 140*   Cardiac Enzymes:  Recent Labs Lab 04/03/16 1006 04/03/16 1328 04/03/16 1459  CKTOTAL  --  223  --   TROPONINI <0.03  --  <0.03   BNP (last 3 results)  Recent Labs  04/03/16 1006  BNP 173.0*     CBG:  Recent Labs Lab 04/03/16 1236  GLUCAP 96    Recent Results (from the past 240 hour(s))  Blood culture (routine x 2)     Status: None (Preliminary result)   Collection Time: 04/03/16  1:28 PM  Result Value Ref Range Status   Specimen Description BLOOD RIGHT HAND  Final   Special Requests BOTTLES DRAWN AEROBIC AND ANAEROBIC 6CC  Final   Culture PENDING  Incomplete   Report Status PENDING  Incomplete  Blood culture (routine x 2)     Status: None (Preliminary  result)   Collection Time: 04/03/16  1:57 PM  Result Value Ref Range Status   Specimen Description BLOOD LEFT HAND  Final   Special Requests BOTTLES DRAWN AEROBIC AND ANAEROBIC [facility] EACH  Final   Culture PENDING  Incomplete   Report Status PENDING  Incomplete  MRSA PCR Screening     Status: None   Collection Time: 04/03/16  5:00 PM  Result Value Ref Range Status   MRSA by PCR NEGATIVE NEGATIVE Final    Comment:        The GeneXpert MRSA Assay (FDA approved for NASAL specimens only), is one component of a comprehensive MRSA colonization surveillance program. It is not intended  to diagnose MRSA infection nor to guide or monitor treatment for MRSA infections.      Studies: Ct Head Wo Contrast  04/03/2016  CLINICAL DATA:  Confusion EXAM: CT HEAD WITHOUT CONTRAST TECHNIQUE: Contiguous axial images were obtained from the base of the skull through the vertex without intravenous contrast. COMPARISON:  08/30/2014 FINDINGS: Bony calvarium is intact. Soft tissue density is noted within the nasal passages consistent with mucus. The paranasal sinuses are within normal limits. Diffuse atrophic changes are seen. No findings to suggest acute hemorrhage, acute infarction or space-occupying mass lesion are noted. IMPRESSION: Chronic atrophic changes without acute intracranial abnormality. Increased soft tissue density within the nasal passages likely related to mucus. Electronically Signed   By: Inez Catalina M.D.   On: 04/03/2016 14:19   Ct Angio Chest Pe W/cm &/or Wo Cm  04/03/2016  CLINICAL DATA:  Confusion and hypoxia EXAM: CT ANGIOGRAPHY CHEST WITH CONTRAST TECHNIQUE: Multidetector CT imaging of the chest was performed using the standard protocol during bolus administration of intravenous contrast. Multiplanar CT image reconstructions and MIPs were obtained to evaluate the vascular anatomy. CONTRAST:  152m OMNIPAQUE IOHEXOL 350 MG/ML SOLN COMPARISON:  04/03/2016 FINDINGS: Lungs are well aerated bilaterally with dependent atelectasis in the left lung and similar changes on the right lung with more marked atelectasis in the right lower lobe. This may represent some early infiltrate. No parenchymal nodule or sizable effusion is seen. The thoracic inlet is within normal limits. An endotracheal tube and nasogastric catheter are noted in satisfactory position. The thoracic aorta shows changes of prior coronary bypass grafting. Calcific changes are noted without aneurysmal dilatation or dissection. The pulmonary artery as visualized shows a normal branching pattern. No filling defects to suggest  pulmonary emboli are seen. No hilar or mediastinal adenopathy is seen. Heavy coronary calcifications are noted. The visualized upper abdomen is within normal limits. The osseous structures show degenerative change of the thoracic spine. Review of the MIP images confirms the above findings. IMPRESSION: No evidence of pulmonary emboli. Patchy bilateral atelectatic changes worse in the right lower lobe. Tubes and lines as described. Electronically Signed   By: MInez CatalinaM.D.   On: 04/03/2016 14:22   Dg Chest Portable 1 View  04/03/2016  CLINICAL DATA:  82year old male with a history of intubation. EXAM: PORTABLE CHEST 1 VIEW COMPARISON:  04/03/2016 FINDINGS: Interval placement of endotracheal tube, which terminates suitably above the carina, 3.7 cm. Interval placement of gastric tube terminating out of the field of view. Surgical changes of median sternotomy and CABG. Low lung volumes with likely basilar atelectasis. No pneumothorax.  Chronic interstitial opacities. IMPRESSION: Interval placement of endotracheal tube terminating suitably above the carina. Interval placement of gastric tube turning out of the field of view. Low lung volumes with likely basilar atelectasis. Signed, JDulcy Fanny  Earleen Newport, DO [facility] Electronically Signed   By: Corrie Mckusick D.O.   On: 04/03/2016 14:07   Dg Chest Port 1 View  04/03/2016  CLINICAL DATA:  82 year old presenting with acute onset of shortness of breath. Hypoxia upon arrival to the emergency department. Prior CABG. EXAM: PORTABLE CHEST 1 VIEW COMPARISON:  01/29/2015 and earlier, including CTA chest 08/31/2014. FINDINGS: Prior sternotomy for CABG. Cardiac silhouette moderately enlarged, unchanged. Mild pulmonary venous hypertension without overt edema. Prominent right paracardiac fat pad as noted previously. Linear scarring at the right lung base at the site of prior pneumonia. Lungs otherwise clear. No confluent  airspace consolidation. No visible pleural effusions. IMPRESSION: No acute cardiopulmonary disease. Stable moderate cardiomegaly. Mild pulmonary venous hypertension without overt edema. Scarring in the right lung base at the site of prior pneumonia. Electronically Signed   By: Evangeline Dakin M.D.   On: 04/03/2016 10:39    Scheduled Meds: . antiseptic oral rinse  7 mL Mouth Rinse QID  . chlorhexidine gluconate (SAGE KIT)  15 mL Mouth Rinse BID  . [START ON 04/11/2016] clopidogrel  75 mg Oral Daily  . enoxaparin (LOVENOX) injection  40 mg Subcutaneous Q24H  . gabapentin  300 mg Oral TID  . ipratropium-albuterol  3 mL Nebulization Q4H  . levothyroxine  25 mcg Oral QAC breakfast  . pantoprazole (PROTONIX) IV  40 mg Intravenous QHS  . piperacillin-tazobactam (ZOSYN)  IV  3.375 g Intravenous 3 times per day  . pravastatin  40 mg Oral q1800  . tamsulosin  0.4 mg Oral Daily  . vancomycin  1,250 mg Intravenous Q12H   Continuous Infusions: . sodium chloride 50 mL/hr at 04/04/16 0600  . midazolam (VERSED) infusion 3 mg/hr (04/04/16 0600)    Active Problems:   Hyperlipidemia   ALZHEIMERS DISEASE   HYPERTENSION, BENIGN   Acute respiratory failure (HCC)   Acute encephalopathy   Hypothyroidism   Aspiration pneumonia (HCC)   Dementia   Acute respiratory failure with hypoxia ([place])    Time spent: critical care: 25 minutes   Isabel Ardila. MD [name] Pager [phone]. If 7PM-7AM, please contact night-coverage at [email or web address], password [facility] 04/04/2016, 7:18 AM  LOS: 1 day      By signing my name below, I, Rosalie Doctor, attest that this documentation has been prepared under the direction and in the presence of [facility]. MD Electronically Signed: Rosalie Doctor, Scribe. 04/04/2016 9:00am  I, Dr. Kathie Dike, personally performed the services described in this documentaiton. All medical record entries made by the scribe were at my direction and in my presence. I  have reviewed the chart and agree that the record reflects my personal performance and is accurate and complete  Kathie Dike, MD, 04/04/2016 9:12 AM

## 2016-04-04 NOTE — Consult Note (Signed)
Consult requested by:Dr. Memon Consult requested for respiratory failure:  HPI: This is a 82 year old with multiple underlying Medical problems including coronary artery occlusive disease status post bypass surgery and previous MI obstructive sleep apnea. He has dementia and may be chronically aspirating based on his history from his family. His family says he also complains of heartburn frequently. He came to the emergency room because of hypoxia and eventually was intubated for airway protection. He has what looks like right lower lobe pneumonia on chest x-ray and I think that would make sense considering a possible chronic aspiration. He is now alert and awake. He doesn't have a lot of secretions.  Past Medical History  Diagnosis Date  . Arthritis   . Coronary atherosclerosis of native coronary artery     a. Multivessel s/p CABG 1996. b. s/p DES x 2 SVG to PDA 8/12 with distal disease managed medically.  . Mixed hyperlipidemia   . Essential hypertension, benign   . MI (myocardial infarction) ([place])   . Hematuria   . OSA (obstructive sleep apnea)   . Anxiety   . OA (osteoarthritis)   . DDD (degenerative disc disease)     Chronic back pain  . Alzheimer disease   . Enlarged prostate   . Hypothyroidism   . Atrophic gastritis     a. By EGD 02/2013.     Family History  Problem Relation Age of Onset  . Heart disease    . Colon cancer Neg Hx      Social History   Social History  . Marital Status: Married    Spouse Name: N/A  . Number of Children: 3  . Years of Education: N/A   Occupational History  . retired    Social History Main Topics  . Smoking status: Former Smoker -- 2.00 packs/day for 40 years    Types: Cigarettes    Quit date: 12/27/1994  . Smokeless tobacco: Never Used  . Alcohol Use: No  . Drug Use: No  . Sexual Activity: Not Currently   Other Topics Concern  . None   Social History Narrative   Lives w/ ailing wife.   Son brings meals 3x per week      ROS: Not obtainable except from family. They say that he did not have a lot of trouble breathing but he did sleep in a recliner because he said he could not get his breath he was lying flat. As mentioned he complained of heartburn and had what may be some chronic aspiration.    Objective: Vital signs in last 24 hours: Temp:  [97.3 F (36.3 C)-100.8 F (38.2 C)] 100.8 F (38.2 C) (04/09 0800) Pulse Rate:  [46-89] 50 (04/09 0800) Resp:  [10-31] 13 (04/09 0800) BP: (99-163)/(42-111) 119/44 mmHg (04/09 0800) SpO2:  [80 %-100 %] 96 % (04/09 0821) FiO2 (%):  [40 %-100 %] 40 % (04/09 0826) Weight:  [85.3 kg (188 lb 0.8 oz)-93.4 kg (205 lb 14.6 oz)] 93.4 kg (205 lb 14.6 oz) (04/09 0500) Weight change:  Last BM Date:  (unknown)  Intake/Output from previous day: 04/08 0701 - 04/09 0700 In: 4276.7 [I.V.:1276.7; NG/GT:100; IV Piggyback:2900] Out: 2060 [Urine:2060]  PHYSICAL EXAM He is awake and alert. He is intubated and on the ventilator. Pupils are reactive nose and throat are clear his neck is supple without masses. His chest shows rhonchi but not much. His heart is regular without gallop. His abdomen is soft with no masses he has trace edema of the extremities and  his central nervous system examination is grossly intact  Lab Results: Basic Metabolic Panel:  Recent Labs  04/03/16 1006 04/04/16 0423  NA 139 141  K 3.7 3.2*  CL 101 107  CO2 31 25  GLUCOSE 145* 95  BUN 15 10  CREATININE 0.87 0.68  CALCIUM 8.5* 7.9*   Liver Function Tests: No results for input(s): AST, ALT, ALKPHOS, BILITOT, PROT, ALBUMIN in the last 72 hours. No results for input(s): LIPASE, AMYLASE in the last 72 hours.  Recent Labs  04/03/16 1328  AMMONIA 26   CBC:  Recent Labs  04/03/16 1006 04/04/16 0423  WBC 7.6 8.8  NEUTROABS 5.9  --   HGB 12.5* 12.3*  HCT 38.8* 36.6*  MCV 94.6 94.3  PLT 218 140*   Cardiac Enzymes:  Recent Labs  04/03/16 1006 04/03/16 1328 04/03/16 1459  CKTOTAL   --  223  --   TROPONINI <0.03  --  <0.03   BNP: No results for input(s): PROBNP in the last 72 hours. D-Dimer: No results for input(s): DDIMER in the last 72 hours. CBG:  Recent Labs  04/03/16 1236 04/04/16 0724  GLUCAP 96 100*   Hemoglobin A1C: No results for input(s): HGBA1C in the last 72 hours. Fasting Lipid Panel: No results for input(s): CHOL, HDL, LDLCALC, TRIG, CHOLHDL, LDLDIRECT in the last 72 hours. Thyroid Function Tests: No results for input(s): TSH, T4TOTAL, FREET4, T3FREE, THYROIDAB in the last 72 hours. Anemia Panel: No results for input(s): VITAMINB12, FOLATE, FERRITIN, TIBC, IRON, RETICCTPCT in the last 72 hours. Coagulation: No results for input(s): LABPROT, INR in the last 72 hours. Urine Drug Screen: Drugs of Abuse     Component Value Date/Time   LABOPIA NONE DETECTED 04/03/2016 1856   COCAINSCRNUR NONE DETECTED 04/03/2016 1856   LABBENZ POSITIVE* 04/03/2016 1856   AMPHETMU NONE DETECTED 04/03/2016 1856   THCU NONE DETECTED 04/03/2016 1856   LABBARB NONE DETECTED 04/03/2016 1856    Alcohol Level: No results for input(s): ETH in the last 72 hours. Urinalysis:  Recent Labs  04/03/16 1320  COLORURINE YELLOW  LABSPEC 1.025  PHURINE 5.5  GLUCOSEU NEGATIVE  HGBUR SMALL*  BILIRUBINUR NEGATIVE  KETONESUR NEGATIVE  PROTEINUR NEGATIVE  NITRITE NEGATIVE  LEUKOCYTESUR NEGATIVE   Misc. Labs:   ABGS:  Recent Labs  04/04/16 0350  PHART 7.474*  PO2ART 83.1  TCO2 20.4  HCO3 27.9*     MICROBIOLOGY: Recent Results (from the past 240 hour(s))  Blood culture (routine x 2)     Status: None (Preliminary result)   Collection Time: 04/03/16  1:28 PM  Result Value Ref Range Status   Specimen Description BLOOD RIGHT HAND  Final   Special Requests BOTTLES DRAWN AEROBIC AND ANAEROBIC 6CC  Final   Culture PENDING  Incomplete   Report Status PENDING  Incomplete  Blood culture (routine x 2)     Status: None (Preliminary result)   Collection Time:  04/03/16  1:57 PM  Result Value Ref Range Status   Specimen Description BLOOD LEFT HAND  Final   Special Requests BOTTLES DRAWN AEROBIC AND ANAEROBIC [facility] EACH  Final   Culture PENDING  Incomplete   Report Status PENDING  Incomplete  MRSA PCR Screening     Status: None   Collection Time: 04/03/16  5:00 PM  Result Value Ref Range Status   MRSA by PCR NEGATIVE NEGATIVE Final    Comment:        The GeneXpert MRSA Assay (FDA approved for NASAL specimens only), is  one component of a comprehensive MRSA colonization surveillance program. It is not intended to diagnose MRSA infection nor to guide or monitor treatment for MRSA infections.     Studies/Results: Ct Head Wo Contrast  04/03/2016  CLINICAL DATA:  Confusion EXAM: CT HEAD WITHOUT CONTRAST TECHNIQUE: Contiguous axial images were obtained from the base of the skull through the vertex without intravenous contrast. COMPARISON:  08/30/2014 FINDINGS: Bony calvarium is intact. Soft tissue density is noted within the nasal passages consistent with mucus. The paranasal sinuses are within normal limits. Diffuse atrophic changes are seen. No findings to suggest acute hemorrhage, acute infarction or space-occupying mass lesion are noted. IMPRESSION: Chronic atrophic changes without acute intracranial abnormality. Increased soft tissue density within the nasal passages likely related to mucus. Electronically Signed   By: Inez Catalina M.D.   On: 04/03/2016 14:19   Ct Angio Chest Pe W/cm &/or Wo Cm  04/03/2016  CLINICAL DATA:  Confusion and hypoxia EXAM: CT ANGIOGRAPHY CHEST WITH CONTRAST TECHNIQUE: Multidetector CT imaging of the chest was performed using the standard protocol during bolus administration of intravenous contrast. Multiplanar CT image reconstructions and MIPs were obtained to evaluate the vascular anatomy. CONTRAST:  165m OMNIPAQUE IOHEXOL 350 MG/ML SOLN COMPARISON:  04/03/2016 FINDINGS: Lungs are well aerated bilaterally with dependent  atelectasis in the left lung and similar changes on the right lung with more marked atelectasis in the right lower lobe. This may represent some early infiltrate. No parenchymal nodule or sizable effusion is seen. The thoracic inlet is within normal limits. An endotracheal tube and nasogastric catheter are noted in satisfactory position. The thoracic aorta shows changes of prior coronary bypass grafting. Calcific changes are noted without aneurysmal dilatation or dissection. The pulmonary artery as visualized shows a normal branching pattern. No filling defects to suggest pulmonary emboli are seen. No hilar or mediastinal adenopathy is seen. Heavy coronary calcifications are noted. The visualized upper abdomen is within normal limits. The osseous structures show degenerative change of the thoracic spine. Review of the MIP images confirms the above findings. IMPRESSION: No evidence of pulmonary emboli. Patchy bilateral atelectatic changes worse in the right lower lobe. Tubes and lines as described. Electronically Signed   By: MInez CatalinaM.D.   On: 04/03/2016 14:22   Dg Chest Port 1 View  04/04/2016  CLINICAL DATA:  Respiratory failure. EXAM: PORTABLE CHEST 1 VIEW COMPARISON:  04/03/2016 FINDINGS: Endotracheal tube tip projects 3 cm above the carina, well positioned. Oral/nasogastric tube passes below the diaphragm well into the stomach. Mild lung base atelectasis. Mild central vascular congestion without overt pulmonary edema. No convincing pneumonia. Changes from CABG surgery are stable. No pneumothorax. IMPRESSION: 1. Persistent lung base atelectasis. No convincing pneumonia or pulmonary edema. 2. Support apparatus is well positioned. Electronically Signed   By: DLajean ManesM.D.   On: 04/04/2016 09:09   Dg Chest Portable 1 View  04/03/2016  CLINICAL DATA:  82year old male with a history of intubation. EXAM: PORTABLE CHEST 1 VIEW COMPARISON:  04/03/2016 FINDINGS: Interval placement of endotracheal tube,  which terminates suitably above the carina, 3.7 cm. Interval placement of gastric tube terminating out of the field of view. Surgical changes of median sternotomy and CABG. Low lung volumes with likely basilar atelectasis. No pneumothorax.  Chronic interstitial opacities. IMPRESSION: Interval placement of endotracheal tube terminating suitably above the carina. Interval placement of gastric tube turning out of the field of view. Low lung volumes with likely basilar atelectasis. Signed, JDulcy Fanny WEarleen Newport DO [facility] Electronically Signed   By: Corrie Mckusick D.O.   On: 04/03/2016 14:07   Dg Chest Port 1 View  04/03/2016  CLINICAL DATA:  82 year old presenting with acute onset of shortness of breath. Hypoxia upon arrival to the emergency department. Prior CABG. EXAM: PORTABLE CHEST 1 VIEW COMPARISON:  01/29/2015 and earlier, including CTA chest 08/31/2014. FINDINGS: Prior sternotomy for CABG. Cardiac silhouette moderately enlarged, unchanged. Mild pulmonary venous hypertension without overt edema. Prominent right paracardiac fat pad as noted previously. Linear scarring at the right lung base at the site of prior pneumonia. Lungs otherwise clear. No confluent airspace consolidation. No visible pleural effusions. IMPRESSION: No acute cardiopulmonary disease. Stable moderate cardiomegaly. Mild pulmonary venous hypertension without overt edema. Scarring in the right lung base at the site of prior pneumonia. Electronically Signed   By: Evangeline Dakin M.D.   On: 04/03/2016 10:39    Medications:  Prior to Admission:  Prescriptions prior to admission  Medication Sig Dispense Refill Last Dose  . ALPRAZolam (XANAX) 1 MG tablet Take 1 tablet by mouth 4 (four) times daily.   04/03/2016 at Unknown time  . amLODipine (NORVASC) 10 MG tablet Take 1 tablet by mouth daily.   04/03/2016 at Unknown time  . carvedilol (COREG) 3.125 MG tablet Take 1 tablet by mouth 2  (two) times daily.   04/03/2016 at 0800  . clopidogrel (PLAVIX) 75 MG tablet Take 1 tablet (75 mg total) by mouth daily.   04/03/2016 at Unknown time  . donepezil (ARICEPT) 10 MG tablet Take 1 tablet by mouth At bedtime.   04/02/2016 at Unknown time  . escitalopram (LEXAPRO) 10 MG tablet Take 10 mg by mouth daily.   04/03/2016 at Unknown time  . fentaNYL (DURAGESIC - DOSED MCG/HR) 100 MCG/HR Place 1 patch (100 mcg total) onto the skin every 3 (three) days. 10 patch 0 04/03/2016 at Unknown time  . gabapentin (NEURONTIN) 300 MG capsule Take 1 capsule by mouth 3 (three) times daily.   04/03/2016 at Unknown time  . hydrochlorothiazide (MICROZIDE) 12.5 MG capsule Take 1 capsule by mouth daily.   04/03/2016 at Unknown time  . levothyroxine (SYNTHROID, LEVOTHROID) 25 MCG tablet Take 25 mcg by mouth daily before breakfast.   04/03/2016 at Unknown time  . memantine (NAMENDA) 10 MG tablet Take 1 tablet by mouth 2 (two) times daily.   04/03/2016 at Unknown time  . NITROSTAT 0.4 MG SL tablet PLACE ONE TABLET UNDER TONGUE EVERY 5 MIN UP TO 3 DOSES AS NEEDED FORCHEST PAIN. 25 tablet 3 unknown  . oxyCODONE (ROXICODONE) 15 MG immediate release tablet Take 15 mg by mouth every 4 (four) hours as needed for pain Bennie Pierini of 8 tablets daily as needed every 4 to 6 hours for pain*).   04/03/2016 at Unknown time  . pravastatin (PRAVACHOL) 40 MG tablet TAKE (1) TABLET BY MOUTH AT BEDTIME. 30 tablet 6 04/02/2016 at Unknown time  . tamsulosin (FLOMAX) 0.4 MG CAPS capsule Take 1 capsule by mouth daily.   04/03/2016 at Unknown time   Scheduled: . antiseptic oral rinse  7 mL Mouth Rinse QID  . chlorhexidine gluconate (SAGE KIT)  15 mL Mouth Rinse BID  . [START ON 04/11/2016] clopidogrel  75 mg Oral Daily  . enoxaparin (LOVENOX) injection  40 mg Subcutaneous Q24H  . gabapentin  300 mg Oral TID  . ipratropium-albuterol  3 mL Nebulization Q4H  . levothyroxine  25 mcg Oral QAC breakfast  . pantoprazole (PROTONIX) IV  40 mg  Intravenous QHS  .  piperacillin-tazobactam (ZOSYN)  IV  3.375 g Intravenous 3 times per day  . potassium chloride  10 mEq Intravenous Q1 Hr x 4  . pravastatin  40 mg Oral q1800  . tamsulosin  0.4 mg Oral Daily  . vancomycin  1,250 mg Intravenous Q12H   Continuous: . sodium chloride 50 mL/hr at 04/04/16 0800  . midazolam (VERSED) infusion 3 mg/hr (04/04/16 0700)   CWT:PELGKB chloride, acetaminophen, docusate, fentaNYL (SUBLIMAZE) injection, fentaNYL (SUBLIMAZE) injection, midazolam, ondansetron (ZOFRAN) IV  Assesment: He has acute hypoxic respiratory failure. It's not totally clear what the events were but I think he is pretty clearly aspirated and probably chronically aspirates. He had acute encephalopathy which may be related to his pain medication. At baseline he has dementia. It seems that he has been having a little more trouble with swallowing. According to family he complains of reflux symptoms.  Active Problems:   Hyperlipidemia   ALZHEIMERS DISEASE   HYPERTENSION, BENIGN   Acute respiratory failure ([facility])   Acute encephalopathy   Hypothyroidism   Aspiration pneumonia ([facility])   Dementia   Acute respiratory failure with hypoxia ([place])    Plan: He is on appropriate treatment. Probably okay to discontinue vancomycin if desired. I think he may be able to be extubated today.    LOS: 1 day   Adalyne Lovick L 04/04/2016, 9:20 AM

## 2016-04-05 ENCOUNTER — Inpatient Hospital Stay (HOSPITAL_COMMUNITY): Payer: PPO

## 2016-04-05 ENCOUNTER — Inpatient Hospital Stay (HOSPITAL_COMMUNITY)
Admit: 2016-04-05 | Discharge: 2016-04-05 | Disposition: A | Discharge status: Home / Self Care | Payer: PPO | Attending: Internal Medicine | Admitting: Internal Medicine

## 2016-04-05 ENCOUNTER — Other Ambulatory Visit: Payer: Self-pay | Admitting: Adult Health

## 2016-04-05 DIAGNOSIS — J9690 Respiratory failure, unspecified, unspecified whether with hypoxia or hypercapnia: Secondary | ICD-10-CM | POA: Diagnosis not present

## 2016-04-05 DIAGNOSIS — E876 Hypokalemia: Secondary | ICD-10-CM

## 2016-04-05 DIAGNOSIS — J9601 Acute respiratory failure with hypoxia: Secondary | ICD-10-CM | POA: Diagnosis not present

## 2016-04-05 DIAGNOSIS — J189 Pneumonia, unspecified organism: Secondary | ICD-10-CM | POA: Diagnosis not present

## 2016-04-05 LAB — BASIC METABOLIC PANEL
ANION GAP: 6 (ref 5–15)
BUN: 8 mg/dL (ref 6–20)
CALCIUM: 7.9 mg/dL — AB (ref 8.9–10.3)
CO2: 27 mmol/L (ref 22–32)
Chloride: 109 mmol/L (ref 101–111)
Creatinine, Ser: 0.7 mg/dL (ref 0.61–1.24)
GFR calc Af Amer: >60 mL/min (ref >60)
Glucose, Bld: 92 mg/dL (ref 65–99)
POTASSIUM: 2.9 mmol/L — AB (ref 3.5–5.1)
SODIUM: 142 mmol/L (ref 135–145)

## 2016-04-05 LAB — CBC
HEMATOCRIT: 34.2 % — AB (ref 39.0–52.0)
HEMOGLOBIN: 11.1 g/dL — AB (ref 13.0–17.0)
MCH: 31 pg (ref 26.0–34.0)
MCHC: 32.5 g/dL (ref 30.0–36.0)
MCV: 95.5 fL (ref 78.0–100.0)
Platelets: 160 10*3/uL (ref 150–400)
RBC: 3.58 MIL/uL — ABNORMAL LOW (ref 4.22–5.81)
RDW: 12.8 % (ref 11.5–15.5)
WBC: 7.9 10*3/uL (ref 4.0–10.5)

## 2016-04-05 LAB — URINE CULTURE: Culture: NO GROWTH

## 2016-04-05 MED ORDER — ALPRAZOLAM 0.5 MG PO TABS
0.5000 mg | ORAL_TABLET | Freq: Every evening | ORAL | Status: DC | PRN
  Administered 2016-04-05: 0.5 mg via ORAL
  Filled 2016-04-05: qty 1

## 2016-04-05 MED ORDER — POTASSIUM CHLORIDE 10 MEQ/100ML IV SOLN
10.0000 meq | INTRAVENOUS | Status: AC
  Administered 2016-04-05 (×6): 10 meq via INTRAVENOUS
  Filled 2016-04-05 (×5): qty 100

## 2016-04-05 MED ORDER — IPRATROPIUM-ALBUTEROL 0.5-2.5 (3) MG/3ML IN SOLN
3.0000 mL | Freq: Three times a day (TID) | RESPIRATORY_TRACT | Status: DC
  Administered 2016-04-05 – 2016-04-06 (×3): 3 mL via RESPIRATORY_TRACT
  Filled 2016-04-05 (×3): qty 3

## 2016-04-05 MED ORDER — OXYCODONE HCL 5 MG PO TABS
5.0000 mg | ORAL_TABLET | Freq: Four times a day (QID) | ORAL | Status: DC | PRN
  Administered 2016-04-05: 10 mg via ORAL
  Administered 2016-04-05: 5 mg via ORAL
  Administered 2016-04-06 (×2): 10 mg via ORAL
  Filled 2016-04-05: qty 2
  Filled 2016-04-05: qty 1
  Filled 2016-04-05 (×2): qty 2

## 2016-04-05 NOTE — Progress Notes (Signed)
Subjective: He is awake and alert. He was able to be extubated yesterday. No new complaints except a lot of pain in his shoulders  Objective: Vital signs in last 24 hours: Temp:  [97.6 F (36.4 C)-100.8 F (38.2 C)] 97.6 F (36.4 C) (04/10 0749) Pulse Rate:  [45-79] 54 (04/10 0700) Resp:  [9-18] 14 (04/10 0700) BP: (87-138)/(42-112) 129/50 mmHg (04/10 0700) SpO2:  [92 %-99 %] 95 % (04/10 0718) FiO2 (%):  [40 %] 40 % (04/09 0826) Weight:  [93.3 kg (205 lb 11 oz)] 93.3 kg (205 lb 11 oz) (04/10 0500) Weight change: 8 kg (17 lb 10.2 oz) Last BM Date:  (unknown)  Intake/Output from previous day: 04/09 0701 - 04/10 0700 In: 1877.8 [I.V.:767.8; NG/GT:60; IV Piggyback:1050] Out: 1050 [Urine:1050]  PHYSICAL EXAM General appearance: alert, cooperative and Confused Resp: rhonchi bilaterally Cardio: regular rate and rhythm, S1, S2 normal, no murmur, click, rub or gallop GI: soft, non-tender; bowel sounds normal; no masses,  no organomegaly Extremities: extremities normal, atraumatic, no cyanosis or edema  Lab Results:  Results for orders placed or performed during the hospital encounter of 04/03/16 (from the past 48 hour(s))  Basic metabolic panel     Status: Abnormal   Collection Time: 04/03/16 10:06 AM  Result Value Ref Range   Sodium 139 135 - 145 mmol/L   Potassium 3.7 3.5 - 5.1 mmol/L   Chloride 101 101 - 111 mmol/L   CO2 31 22 - 32 mmol/L   Glucose, Bld 145 (H) 65 - 99 mg/dL   BUN 15 6 - 20 mg/dL   Creatinine, Ser 0.87 0.61 - 1.24 mg/dL   Calcium 8.5 (L) 8.9 - 10.3 mg/dL   GFR calc non Af Amer >60 >60 mL/min   GFR calc Af Amer >60 >60 mL/min    Comment: (NOTE) The eGFR has been calculated using the CKD EPI equation. This calculation has not been validated in all clinical situations. eGFR's persistently <60 mL/min signify possible Chronic Kidney Disease.    Anion gap 7 5 - 15  Brain natriuretic peptide     Status: Abnormal   Collection Time: 04/03/16 10:06 AM  Result  Value Ref Range   B Natriuretic Peptide 173.0 (H) 0.0 - 100.0 pg/mL  CBC with Differential     Status: Abnormal   Collection Time: 04/03/16 10:06 AM  Result Value Ref Range   WBC 7.6 4.0 - 10.5 K/uL   RBC 4.10 (L) 4.22 - 5.81 MIL/uL   Hemoglobin 12.5 (L) 13.0 - 17.0 g/dL   HCT 38.8 (L) 39.0 - 52.0 %   MCV 94.6 78.0 - 100.0 fL   MCH 30.5 26.0 - 34.0 pg   MCHC 32.2 30.0 - 36.0 g/dL   RDW 12.8 11.5 - 15.5 %   Platelets 218 150 - 400 K/uL   Neutrophils Relative % 78 %   Neutro Abs 5.9 1.7 - 7.7 K/uL   Lymphocytes Relative 15 %   Lymphs Abs 1.1 0.7 - 4.0 K/uL   Monocytes Relative 5 %   Monocytes Absolute 0.4 0.1 - 1.0 K/uL   Eosinophils Relative 2 %   Eosinophils Absolute 0.1 0.0 - 0.7 K/uL   Basophils Relative 0 %   Basophils Absolute 0.0 0.0 - 0.1 K/uL  Troponin I     Status: None   Collection Time: 04/03/16 10:06 AM  Result Value Ref Range   Troponin I <0.03 <0.031 ng/mL    Comment:        NO INDICATION OF MYOCARDIAL  INJURY.   Blood gas, arterial ([facility] ONLY)     Status: Abnormal   Collection Time: 04/03/16 10:19 AM  Result Value Ref Range   O2 Content 3.0 L/min   Delivery systems NASAL CANNULA    pH, Arterial 7.370 7.350 - 7.450   pCO2 arterial 48.1 (H) 35.0 - 45.0 mmHg   pO2, Arterial 98.4 80.0 - 100.0 mmHg   Bicarbonate 26.0 (H) 20.0 - 24.0 mEq/L   Acid-Base Excess 2.4 (H) 0.0 - 2.0 mmol/L   O2 Saturation 98.4 %   Patient temperature 37.0    Collection site RIGHT RADIAL    Drawn by [id number]    Sample type ARTERIAL DRAW    Allens test (pass/fail) PASS PASS  CBG monitoring, ED     Status: None   Collection Time: 04/03/16 12:36 PM  Result Value Ref Range   Glucose-Capillary 96 65 - 99 mg/dL  Urinalysis, Routine w reflex microscopic (not at [facility])     Status: Abnormal   Collection Time: 04/03/16  1:20 PM  Result Value Ref Range   Color, Urine YELLOW YELLOW   APPearance CLEAR CLEAR   Specific Gravity, Urine 1.025 1.005 - 1.030   pH 5.5 5.0 - 8.0   Glucose, UA  NEGATIVE NEGATIVE mg/dL   Hgb urine dipstick SMALL (A) NEGATIVE   Bilirubin Urine NEGATIVE NEGATIVE   Ketones, ur NEGATIVE NEGATIVE mg/dL   Protein, ur NEGATIVE NEGATIVE mg/dL   Nitrite NEGATIVE NEGATIVE   Leukocytes, UA NEGATIVE NEGATIVE  Urine culture     Status: None (Preliminary result)   Collection Time: 04/03/16  1:20 PM  Result Value Ref Range   Specimen Description URINE, CATHETERIZED    Special Requests NONE    Culture      NO GROWTH < 24 HOURS Performed at [facility]    Report Status PENDING   Urine microscopic-add on     Status: None   Collection Time: 04/03/16  1:20 PM  Result Value Ref Range   Squamous Epithelial / LPF NONE SEEN NONE SEEN   WBC, UA NONE SEEN 0 - 5 WBC/hpf   RBC / HPF 0-5 0 - 5 RBC/hpf   Bacteria, UA NONE SEEN NONE SEEN  Ammonia     Status: None   Collection Time: 04/03/16  1:28 PM  Result Value Ref Range   Ammonia 26 9 - 35 umol/L  Blood culture (routine x 2)     Status: None (Preliminary result)   Collection Time: 04/03/16  1:28 PM  Result Value Ref Range   Specimen Description BLOOD RIGHT HAND    Special Requests BOTTLES DRAWN AEROBIC AND ANAEROBIC 6CC    Culture NO GROWTH < 24 HOURS    Report Status PENDING   CK     Status: None   Collection Time: 04/03/16  1:28 PM  Result Value Ref Range   Total CK 223 49 - 397 U/L  Lactic acid, plasma     Status: None   Collection Time: 04/03/16  1:28 PM  Result Value Ref Range   Lactic Acid, Venous 1.2 0.5 - 2.0 mmol/L  Blood culture (routine x 2)     Status: None (Preliminary result)   Collection Time: 04/03/16  1:57 PM  Result Value Ref Range   Specimen Description BLOOD LEFT HAND    Special Requests BOTTLES DRAWN AEROBIC AND ANAEROBIC 6CC EACH    Culture NO GROWTH < 24 HOURS    Report Status PENDING   Blood gas, arterial (  [facility] ONLY)     Status: Abnormal   Collection Time: 04/03/16  2:33 PM  Result Value Ref Range   FIO2 0.50    Delivery systems VENTILATOR    Mode PRESSURE  REGULATED VOLUME CONTROL    VT 560 mL   LHR 22 resp/min   Peep/cpap 5.0 cm H20   pH, Arterial 7.60 (H) 7.350 - 7.450   pCO2 arterial 23.7 (L) 35.0 - 45.0 mmHg   pO2, Arterial 134 (H) 80.0 - 100.0 mmHg   Bicarbonate 27.3 (H) 20.0 - 24.0 mEq/L   Acid-Base Excess 1.7 0.0 - 2.0 mmol/L   O2 Saturation 99.2 %   Patient temperature 37.0    Collection site LEFT RADIAL    Drawn by [id number]    Sample type ARTERIAL DRAW    Allens test (pass/fail) PASS PASS  Troponin I     Status: None   Collection Time: 04/03/16  2:59 PM  Result Value Ref Range   Troponin I <0.03 <0.031 ng/mL    Comment:        NO INDICATION OF MYOCARDIAL INJURY.   Blood gas, arterial ([facility] ONLY)     Status: Abnormal   Collection Time: 04/03/16  4:15 PM  Result Value Ref Range   FIO2 0.40    Delivery systems VENTILATOR    Mode PRESSURE REGULATED VOLUME CONTROL    VT 560 mL   LHR 14 resp/min   Peep/cpap 5.0 cm H20   pH, Arterial 7.485 (H) 7.350 - 7.450   pCO2 arterial 35.3 35.0 - 45.0 mmHg   pO2, Arterial 89.4 80.0 - 100.0 mmHg   Bicarbonate 27.4 (H) 20.0 - 24.0 mEq/L   Acid-Base Excess 3.0 (H) 0.0 - 2.0 mmol/L   O2 Saturation 97.5 %   Collection site RIGHT RADIAL    Drawn by [id number]    Sample type ARTERIAL DRAW    Allens test (pass/fail) PASS PASS  MRSA PCR Screening     Status: None   Collection Time: 04/03/16  5:00 PM  Result Value Ref Range   MRSA by PCR NEGATIVE NEGATIVE    Comment:        The GeneXpert MRSA Assay (FDA approved for NASAL specimens only), is one component of a comprehensive MRSA colonization surveillance program. It is not intended to diagnose MRSA infection nor to guide or monitor treatment for MRSA infections.   Strep pneumoniae urinary antigen  (not at [facility])     Status: None   Collection Time: 04/03/16  6:56 PM  Result Value Ref Range   Strep Pneumo Urinary Antigen NEGATIVE NEGATIVE    Comment:        Infection due to S. pneumoniae cannot be absolutely ruled out since the  antigen present may be below the detection limit of the test. Performed at [facility]   Urine rapid drug screen (hosp performed)     Status: Abnormal   Collection Time: 04/03/16  6:56 PM  Result Value Ref Range   Opiates NONE DETECTED NONE DETECTED   Cocaine NONE DETECTED NONE DETECTED   Benzodiazepines POSITIVE (A) NONE DETECTED   Amphetamines NONE DETECTED NONE DETECTED   Tetrahydrocannabinol NONE DETECTED NONE DETECTED   Barbiturates NONE DETECTED NONE DETECTED    Comment:        DRUG SCREEN FOR MEDICAL PURPOSES ONLY.  IF CONFIRMATION IS NEEDED FOR ANY PURPOSE, NOTIFY LAB WITHIN 5 DAYS.        LOWEST DETECTABLE LIMITS FOR URINE DRUG SCREEN  Drug Class       Cutoff (ng/mL) Amphetamine      1000 Barbiturate      200 Benzodiazepine   549 Tricyclics       826 Opiates          300 Cocaine          300 THC              50   Blood gas, arterial     Status: Abnormal   Collection Time: 04/03/16  7:53 PM  Result Value Ref Range   FIO2 0.40    Delivery systems VENTILATOR    Mode PRESSURE REGULATED VOLUME CONTROL    VT 560 mL   LHR 12.0 resp/min   Peep/cpap 5.0 cm H20   pH, Arterial 7.462 (H) 7.350 - 7.450   pCO2 arterial 39.0 35.0 - 45.0 mmHg   pO2, Arterial 74.9 (L) 80.0 - 100.0 mmHg   Bicarbonate 27.8 (H) 20.0 - 24.0 mEq/L   TCO2 17.4 0 - 100 mmol/L   Acid-Base Excess 3.8 (H) 0.0 - 2.0 mmol/L   O2 Saturation 94.9 %   Collection site RIGHT RADIAL    Drawn by [id number]    Sample type ARTERIAL DRAW    Allens test (pass/fail) PASS PASS  Blood gas, arterial     Status: Abnormal   Collection Time: 04/04/16  3:50 AM  Result Value Ref Range   FIO2 0.40    Delivery systems VENTILATOR    Mode PRESSURE REGULATED VOLUME CONTROL    VT 560 mL   LHR 12.0 resp/min   Peep/cpap 5.0 cm H20   pH, Arterial 7.474 (H) 7.350 - 7.450   pCO2 arterial 37.3 35.0 - 45.0 mmHg   pO2, Arterial 83.1 80.0 - 100.0 mmHg   Bicarbonate 27.9 (H) 20.0 - 24.0 mEq/L   TCO2 20.4 0 - 100 mmol/L    Acid-Base Excess 3.6 (H) 0.0 - 2.0 mmol/L   O2 Saturation 96.4 %   Collection site RIGHT RADIAL    Drawn by [id number]    Sample type ARTERIAL DRAW    Allens test (pass/fail) PASS PASS  CBC     Status: Abnormal   Collection Time: 04/04/16  4:23 AM  Result Value Ref Range   WBC 8.8 4.0 - 10.5 K/uL   RBC 3.88 (L) 4.22 - 5.81 MIL/uL   Hemoglobin 12.3 (L) 13.0 - 17.0 g/dL   HCT 36.6 (L) 39.0 - 52.0 %   MCV 94.3 78.0 - 100.0 fL   MCH 31.7 26.0 - 34.0 pg   MCHC 33.6 30.0 - 36.0 g/dL   RDW 12.9 11.5 - 15.5 %   Platelets 140 (L) 150 - 400 K/uL  Basic metabolic panel     Status: Abnormal   Collection Time: 04/04/16  4:23 AM  Result Value Ref Range   Sodium 141 135 - 145 mmol/L   Potassium 3.2 (L) 3.5 - 5.1 mmol/L   Chloride 107 101 - 111 mmol/L   CO2 25 22 - 32 mmol/L   Glucose, Bld 95 65 - 99 mg/dL   BUN 10 6 - 20 mg/dL   Creatinine, Ser 0.68 0.61 - 1.24 mg/dL   Calcium 7.9 (L) 8.9 - 10.3 mg/dL   GFR calc non Af Amer >60 >60 mL/min   GFR calc Af Amer >60 >60 mL/min    Comment: (NOTE) The eGFR has been calculated using the CKD EPI equation. This calculation has not been validated in all clinical situations. eGFR's persistently <  60 mL/min signify possible Chronic Kidney Disease.    Anion gap 9 5 - 15  Glucose, capillary     Status: Abnormal   Collection Time: 04/04/16  7:24 AM  Result Value Ref Range   Glucose-Capillary 100 (H) 65 - 99 mg/dL   Comment 1 Notify RN    Comment 2 Document in Chart   Blood gas, arterial     Status: Abnormal   Collection Time: 04/04/16  9:45 AM  Result Value Ref Range   FIO2 40.00    Delivery systems VENTILATOR    Mode CONTINUOUS POSITIVE AIRWAY PRESSURE    Peep/cpap 5.0 cm H20   Pressure support 5 cm H20   pH, Arterial 7.404 7.350 - 7.450   pCO2 arterial 46.6 (H) 35.0 - 45.0 mmHg   pO2, Arterial 80.3 80.0 - 100.0 mmHg   Bicarbonate 27.6 (H) 20.0 - 24.0 mEq/L   Acid-Base Excess 4.1 (H) 0.0 - 2.0 mmol/L   O2 Saturation 95.7 %   Collection site  RIGHT BRACHIAL    Drawn by [id number]    Sample type ARTERIAL   Basic metabolic panel     Status: Abnormal   Collection Time: 04/05/16  4:12 AM  Result Value Ref Range   Sodium 142 135 - 145 mmol/L   Potassium 2.9 (L) 3.5 - 5.1 mmol/L   Chloride 109 101 - 111 mmol/L   CO2 27 22 - 32 mmol/L   Glucose, Bld 92 65 - 99 mg/dL   BUN 8 6 - 20 mg/dL   Creatinine, Ser 0.70 0.61 - 1.24 mg/dL   Calcium 7.9 (L) 8.9 - 10.3 mg/dL   GFR calc non Af Amer >60 >60 mL/min   GFR calc Af Amer >60 >60 mL/min    Comment: (NOTE) The eGFR has been calculated using the CKD EPI equation. This calculation has not been validated in all clinical situations. eGFR's persistently <60 mL/min signify possible Chronic Kidney Disease.    Anion gap 6 5 - 15  CBC     Status: Abnormal   Collection Time: 04/05/16  4:12 AM  Result Value Ref Range   WBC 7.9 4.0 - 10.5 K/uL   RBC 3.58 (L) 4.22 - 5.81 MIL/uL   Hemoglobin 11.1 (L) 13.0 - 17.0 g/dL   HCT 34.2 (L) 39.0 - 52.0 %   MCV 95.5 78.0 - 100.0 fL   MCH 31.0 26.0 - 34.0 pg   MCHC 32.5 30.0 - 36.0 g/dL   RDW 12.8 11.5 - 15.5 %   Platelets 160 150 - 400 K/uL    ABGS  Recent Labs  04/04/16 0350 04/04/16 0945  PHART 7.474* 7.404  PO2ART 83.1 80.3  TCO2 20.4  --   HCO3 27.9* 27.6*   CULTURES Recent Results (from the past 240 hour(s))  Urine culture     Status: None (Preliminary result)   Collection Time: 04/03/16  1:20 PM  Result Value Ref Range Status   Specimen Description URINE, CATHETERIZED  Final   Special Requests NONE  Final   Culture   Final    NO GROWTH < 24 HOURS Performed at [facility]    Report Status PENDING  Incomplete  Blood culture (routine x 2)     Status: None (Preliminary result)   Collection Time: 04/03/16  1:28 PM  Result Value Ref Range Status   Specimen Description BLOOD RIGHT HAND  Final   Special Requests BOTTLES DRAWN AEROBIC AND ANAEROBIC 6CC  Final   Culture NO GROWTH <  24 HOURS  Final   Report Status PENDING   Incomplete  Blood culture (routine x 2)     Status: None (Preliminary result)   Collection Time: 04/03/16  1:57 PM  Result Value Ref Range Status   Specimen Description BLOOD LEFT HAND  Final   Special Requests BOTTLES DRAWN AEROBIC AND ANAEROBIC 6CC EACH  Final   Culture NO GROWTH < 24 HOURS  Final   Report Status PENDING  Incomplete  MRSA PCR Screening     Status: None   Collection Time: 04/03/16  5:00 PM  Result Value Ref Range Status   MRSA by PCR NEGATIVE NEGATIVE Final    Comment:        The GeneXpert MRSA Assay (FDA approved for NASAL specimens only), is one component of a comprehensive MRSA colonization surveillance program. It is not intended to diagnose MRSA infection nor to guide or monitor treatment for MRSA infections.    Studies/Results: Ct Head Wo Contrast  04/03/2016  CLINICAL DATA:  Confusion EXAM: CT HEAD WITHOUT CONTRAST TECHNIQUE: Contiguous axial images were obtained from the base of the skull through the vertex without intravenous contrast. COMPARISON:  08/30/2014 FINDINGS: Bony calvarium is intact. Soft tissue density is noted within the nasal passages consistent with mucus. The paranasal sinuses are within normal limits. Diffuse atrophic changes are seen. No findings to suggest acute hemorrhage, acute infarction or space-occupying mass lesion are noted. IMPRESSION: Chronic atrophic changes without acute intracranial abnormality. Increased soft tissue density within the nasal passages likely related to mucus. Electronically Signed   By: Inez Catalina M.D.   On: 04/03/2016 14:19   Ct Angio Chest Pe W/cm &/or Wo Cm  04/03/2016  CLINICAL DATA:  Confusion and hypoxia EXAM: CT ANGIOGRAPHY CHEST WITH CONTRAST TECHNIQUE: Multidetector CT imaging of the chest was performed using the standard protocol during bolus administration of intravenous contrast. Multiplanar CT image reconstructions and MIPs were obtained to evaluate the vascular anatomy. CONTRAST:  185m OMNIPAQUE  IOHEXOL 350 MG/ML SOLN COMPARISON:  04/03/2016 FINDINGS: Lungs are well aerated bilaterally with dependent atelectasis in the left lung and similar changes on the right lung with more marked atelectasis in the right lower lobe. This may represent some early infiltrate. No parenchymal nodule or sizable effusion is seen. The thoracic inlet is within normal limits. An endotracheal tube and nasogastric catheter are noted in satisfactory position. The thoracic aorta shows changes of prior coronary bypass grafting. Calcific changes are noted without aneurysmal dilatation or dissection. The pulmonary artery as visualized shows a normal branching pattern. No filling defects to suggest pulmonary emboli are seen. No hilar or mediastinal adenopathy is seen. Heavy coronary calcifications are noted. The visualized upper abdomen is within normal limits. The osseous structures show degenerative change of the thoracic spine. Review of the MIP images confirms the above findings. IMPRESSION: No evidence of pulmonary emboli. Patchy bilateral atelectatic changes worse in the right lower lobe. Tubes and lines as described. Electronically Signed   By: MInez CatalinaM.D.   On: 04/03/2016 14:22   Dg Chest Port 1 View  04/04/2016  CLINICAL DATA:  Respiratory failure. EXAM: PORTABLE CHEST 1 VIEW COMPARISON:  04/03/2016 FINDINGS: Endotracheal tube tip projects 3 cm above the carina, well positioned. Oral/nasogastric tube passes below the diaphragm well into the stomach. Mild lung base atelectasis. Mild central vascular congestion without overt pulmonary edema. No convincing pneumonia. Changes from CABG surgery are stable. No pneumothorax. IMPRESSION: 1. Persistent lung base atelectasis. No convincing pneumonia or pulmonary edema. 2.  Support apparatus is well positioned. Electronically Signed   By: Lajean Manes M.D.   On: 04/04/2016 09:09   Dg Chest Portable 1 View  04/03/2016  CLINICAL DATA:  82 year old male with a history of intubation.  EXAM: PORTABLE CHEST 1 VIEW COMPARISON:  04/03/2016 FINDINGS: Interval placement of endotracheal tube, which terminates suitably above the carina, 3.7 cm. Interval placement of gastric tube terminating out of the field of view. Surgical changes of median sternotomy and CABG. Low lung volumes with likely basilar atelectasis. No pneumothorax.  Chronic interstitial opacities. IMPRESSION: Interval placement of endotracheal tube terminating suitably above the carina. Interval placement of gastric tube turning out of the field of view. Low lung volumes with likely basilar atelectasis. Signed, Dulcy Fanny. Earleen Newport, DO [facility] Electronically Signed   By: Corrie Mckusick D.O.   On: 04/03/2016 14:07   Dg Chest Port 1 View  04/03/2016  CLINICAL DATA:  82 year old presenting with acute onset of shortness of breath. Hypoxia upon arrival to the emergency department. Prior CABG. EXAM: PORTABLE CHEST 1 VIEW COMPARISON:  01/29/2015 and earlier, including CTA chest 08/31/2014. FINDINGS: Prior sternotomy for CABG. Cardiac silhouette moderately enlarged, unchanged. Mild pulmonary venous hypertension without overt edema. Prominent right paracardiac fat pad as noted previously. Linear scarring at the right lung base at the site of prior pneumonia. Lungs otherwise clear. No confluent airspace consolidation. No visible pleural effusions. IMPRESSION: No acute cardiopulmonary disease. Stable moderate cardiomegaly. Mild pulmonary venous hypertension without overt edema. Scarring in the right lung base at the site of prior pneumonia. Electronically Signed   By: Evangeline Dakin M.D.   On: 04/03/2016 10:39    Medications:  Prior to Admission:  Prescriptions prior to admission  Medication Sig Dispense Refill Last Dose  . ALPRAZolam (XANAX) 1 MG tablet Take 1 tablet by mouth 4 (four) times daily.   04/03/2016 at Unknown time  . amLODipine (NORVASC) 10 MG tablet Take 1 tablet by mouth  daily.   04/03/2016 at Unknown time  . carvedilol (COREG) 3.125 MG tablet Take 1 tablet by mouth 2 (two) times daily.   04/03/2016 at 0800  . clopidogrel (PLAVIX) 75 MG tablet Take 1 tablet (75 mg total) by mouth daily.   04/03/2016 at Unknown time  . donepezil (ARICEPT) 10 MG tablet Take 1 tablet by mouth At bedtime.   04/02/2016 at Unknown time  . escitalopram (LEXAPRO) 10 MG tablet Take 10 mg by mouth daily.   04/03/2016 at Unknown time  . fentaNYL (DURAGESIC - DOSED MCG/HR) 100 MCG/HR Place 1 patch (100 mcg total) onto the skin every 3 (three) days. 10 patch 0 04/03/2016 at Unknown time  . gabapentin (NEURONTIN) 300 MG capsule Take 1 capsule by mouth 3 (three) times daily.   04/03/2016 at Unknown time  . hydrochlorothiazide (MICROZIDE) 12.5 MG capsule Take 1 capsule by mouth daily.   04/03/2016 at Unknown time  . levothyroxine (SYNTHROID, LEVOTHROID) 25 MCG tablet Take 25 mcg by mouth daily before breakfast.   04/03/2016 at Unknown time  . memantine (NAMENDA) 10 MG tablet Take 1 tablet by mouth 2 (two) times daily.   04/03/2016 at Unknown time  . NITROSTAT 0.4 MG SL tablet PLACE ONE TABLET UNDER TONGUE EVERY 5 MIN UP TO 3 DOSES AS NEEDED FORCHEST PAIN. 25 tablet 3 unknown  . oxyCODONE (ROXICODONE) 15 MG immediate release tablet Take 15 mg by mouth every 4 (four) hours as needed for pain Bennie Pierini of 8 tablets daily as needed every  4 to 6 hours for pain*).   04/03/2016 at Unknown time  . tamsulosin (FLOMAX) 0.4 MG CAPS capsule Take 1 capsule by mouth daily.   04/03/2016 at Unknown time   Scheduled: . antiseptic oral rinse  7 mL Mouth Rinse QID  . chlorhexidine gluconate (SAGE KIT)  15 mL Mouth Rinse BID  . [START ON 04/11/2016] clopidogrel  75 mg Oral Daily  . enoxaparin (LOVENOX) injection  40 mg Subcutaneous Q24H  . gabapentin  300 mg Oral TID  . ipratropium-albuterol  3 mL Nebulization TID  . levothyroxine  25 mcg Oral QAC breakfast  . pantoprazole (PROTONIX) IV  40 mg Intravenous QHS  . piperacillin-tazobactam  (ZOSYN)  IV  3.375 g Intravenous 3 times per day  . potassium chloride  10 mEq Intravenous Q1 Hr x 6  . pravastatin  40 mg Oral q1800  . tamsulosin  0.4 mg Oral Daily   Continuous:  EZV:GJFTNB chloride, acetaminophen, ALPRAZolam, ondansetron (ZOFRAN) IV, oxyCODONE  Assesment: He was admitted with acute hypoxic respiratory failure requiring intubation and mechanical ventilation. This was mostly for airway protection because he had acute encephalopathy and that seems much better. He was able to be extubated yesterday. I think his respiratory failure is multi-factorial and probably from aspiration pneumonia which is being treated. Apparently he may have problems with chronic aspiration related to his Alzheimer's disease Active Problems:   Hyperlipidemia   ALZHEIMERS DISEASE   HYPERTENSION, BENIGN   Acute respiratory failure (HCC)   Acute encephalopathy   Hypothyroidism   Aspiration pneumonia (HCC)   Dementia   Acute respiratory failure with hypoxia (HCC)    Plan: Continue treatments. From a strictly pulmonary point of view he can potentially moved from ICU today    LOS: 2 days   Bassheva Flury L 04/05/2016, 7:52 AM

## 2016-04-05 NOTE — Progress Notes (Signed)
EEG Completed; Results Pending  

## 2016-04-05 NOTE — Progress Notes (Signed)
Okay to transfer patient to lower level of care per MD order. Pt AOx3, IV patent and continuous fluid infusing. O2 sat maintained 95-98% in 2 L per [place]. Report given to [place], RN, pt transferred to medical floor in wheelchair.

## 2016-04-05 NOTE — Progress Notes (Signed)
[name] PROGRESS NOTE  Dustin [name]:[id number] DOB: Oct 11, 1934 DOA: 04/03/2016 PCP: Glo Herring., MD  Assessment/Plan: 1. Acute respiratory failure with hypoxia. Etiology is unclear. Patient may have developed aspiration pneumonia. He was placed on mechanical ventilation 4/8 and extubated 4/9 without difficulty. Will continue bronchodilators. Appreciate pulmonology input.  2. Aspiration pneumonia. Repeat CXR reveals possible RLL PNA. MRSA PCR negative so will discontinue Vancomycin, will continue zosyn. His WBC remains wnl however he was mildly febrile at 100.6 on admission. He is now afebrile. Bood and sputum culture in process. Anticipate transition to oral abx tomorrow. Family reports chronic issues with dysphagia and aspiration. ST has been consulted.  3. Acute encephalopathy. Appears to be at baseline. Unclear etiology. Possibly related to narcotic overdose. Urinalysis is unremarkable. U-tox is appropriately positive for benzodiazepines.  CT head was negative. He appeared to have some jerking movements in extremities on 4/8 so he was started on a midazolam infusion with improvement. Follow up EEG. 4. Hypokalemia, replete. 5. Hypothyroidism. Continue on synthroid 6. Dementia. On namenda and aricept. Appears to be at baseline.  7. Chronic pain. Patient reports he is on Fentanyl 3 patches a week and occassionally takes oxycodone. Will continue to hold fentanyl  patch and restart lower does oxycodone for pain.  8. Anxiety, patient reports taking Xanax QHS. Will restart at lower dose.    Code Status: full code DVT Prophylaxis: lovenox Family Communication:  No family at bedside.   Disposition Plan:  Transfer to medical bed. Anticipate discharge in 1-2 days.    Consultants:   Pulmonology   Procedures:   Intubated 4/8>>4/9  Antibiotics:  Vancomycin 4/8>>4/10  Zosyn 4/8>>  HPI/Subjective: Feels bad. Mild cough but breathing is ok. Complains of consistent chronic  pain in right shoulder due to arthritis. Admits to fentanyl use (3 patches a week) and occasional oxycodone. Xanax is taken twice nightly.  Objective: Filed Vitals:   04/05/16 0500 04/05/16 0600  BP: 128/48 118/50  Pulse: 52 51  Temp: 99.5 F (37.5 C)   Resp: 17 11    Intake/Output Summary (Last 24 hours) at 04/05/16 0714 Last data filed at 04/05/16 0600  Gross per 24 hour  Intake 1877.75 ml  Output   1050 ml  Net 827.75 ml   Filed Weights   04/03/16 1743 04/04/16 0500 04/05/16 0500  Weight: 93 kg (205 lb 0.4 oz) 93.4 kg (205 lb 14.6 oz) 93.3 kg (205 lb 11 oz)    Exam:  General: NAD Cardiovascular: RRR, S1, S2  Respiratory: clear bilaterally, No wheezing, rales or rhonchi Abdomen: soft, non tender, no distention , bowel sounds normal Musculoskeletal: No edema b/l   Data Reviewed: Basic Metabolic Panel:  Recent Labs Lab 04/03/16 1006 04/04/16 0423 04/05/16 0412  NA 139 141 142  K 3.7 3.2* 2.9*  CL 101 107 109  CO2 '31 25 27  ' GLUCOSE 145* 95 92  BUN '15 10 8  ' CREATININE 0.87 0.68 0.70  CALCIUM 8.5* 7.9* 7.9*       Recent Labs Lab 04/03/16 1328  AMMONIA 26   CBC:  Recent Labs Lab 04/03/16 1006 04/04/16 0423 04/05/16 0412  WBC 7.6 8.8 7.9  NEUTROABS 5.9  --   --   HGB 12.5* 12.3* 11.1*  HCT 38.8* 36.6* 34.2*  MCV 94.6 94.3 95.5  PLT 218 140* 160   Cardiac Enzymes:  Recent Labs Lab 04/03/16 1006 04/03/16 1328 04/03/16 1459  CKTOTAL  --  223  --   TROPONINI <0.03  --  <  0.03   BNP (last 3 results)  Recent Labs  04/03/16 1006  BNP 173.0*     CBG:  Recent Labs Lab 04/03/16 1236 04/04/16 0724  GLUCAP 96 100*    Recent Results (from the past 240 hour(s))  Urine culture     Status: None (Preliminary result)   Collection Time: 04/03/16  1:20 PM  Result Value Ref Range Status   Specimen Description URINE, CATHETERIZED  Final   Special Requests NONE  Final   Culture   Final    NO GROWTH < 24 HOURS Performed at [facility]    Report Status PENDING  Incomplete  Blood culture (routine x 2)     Status: None (Preliminary result)   Collection Time: 04/03/16  1:28 PM  Result Value Ref Range Status   Specimen Description BLOOD RIGHT HAND  Final   Special Requests BOTTLES DRAWN AEROBIC AND ANAEROBIC 6CC  Final   Culture NO GROWTH < 24 HOURS  Final   Report Status PENDING  Incomplete  Blood culture (routine x 2)     Status: None (Preliminary result)   Collection Time: 04/03/16  1:57 PM  Result Value Ref Range Status   Specimen Description BLOOD LEFT HAND  Final   Special Requests BOTTLES DRAWN AEROBIC AND ANAEROBIC 6CC EACH  Final   Culture NO GROWTH < 24 HOURS  Final   Report Status PENDING  Incomplete  MRSA PCR Screening     Status: None   Collection Time: 04/03/16  5:00 PM  Result Value Ref Range Status   MRSA by PCR NEGATIVE NEGATIVE Final    Comment:        The GeneXpert MRSA Assay (FDA approved for NASAL specimens only), is one component of a comprehensive MRSA colonization surveillance program. It is not intended to diagnose MRSA infection nor to guide or monitor treatment for MRSA infections.      Studies: Ct Head Wo Contrast  04/03/2016  CLINICAL DATA:  Confusion EXAM: CT HEAD WITHOUT CONTRAST TECHNIQUE: Contiguous axial images were obtained from the base of the skull through the vertex without intravenous contrast. COMPARISON:  08/30/2014 FINDINGS: Bony calvarium is intact. Soft tissue density is noted within the nasal passages consistent with mucus. The paranasal sinuses are within normal limits. Diffuse atrophic changes are seen. No findings to suggest acute hemorrhage, acute infarction or space-occupying mass lesion are noted. IMPRESSION: Chronic atrophic changes without acute intracranial abnormality. Increased soft tissue density within the nasal passages likely related to mucus. Electronically Signed   By: Inez Catalina M.D.   On: 04/03/2016 14:19   Ct Angio Chest Pe W/cm &/or Wo  Cm  04/03/2016  CLINICAL DATA:  Confusion and hypoxia EXAM: CT ANGIOGRAPHY CHEST WITH CONTRAST TECHNIQUE: Multidetector CT imaging of the chest was performed using the standard protocol during bolus administration of intravenous contrast. Multiplanar CT image reconstructions and MIPs were obtained to evaluate the vascular anatomy. CONTRAST:  151m OMNIPAQUE IOHEXOL 350 MG/ML SOLN COMPARISON:  04/03/2016 FINDINGS: Lungs are well aerated bilaterally with dependent atelectasis in the left lung and similar changes on the right lung with more marked atelectasis in the right lower lobe. This may represent some early infiltrate. No parenchymal nodule or sizable effusion is seen. The thoracic inlet is within normal limits. An endotracheal tube and nasogastric catheter are noted in satisfactory position. The thoracic aorta shows changes of prior coronary bypass grafting. Calcific changes are noted without aneurysmal dilatation or dissection. The pulmonary artery as visualized shows a  normal branching pattern. No filling defects to suggest pulmonary emboli are seen. No hilar or mediastinal adenopathy is seen. Heavy coronary calcifications are noted. The visualized upper abdomen is within normal limits. The osseous structures show degenerative change of the thoracic spine. Review of the MIP images confirms the above findings. IMPRESSION: No evidence of pulmonary emboli. Patchy bilateral atelectatic changes worse in the right lower lobe. Tubes and lines as described. Electronically Signed   By: Inez Catalina M.D.   On: 04/03/2016 14:22   Dg Chest Port 1 View  04/04/2016  CLINICAL DATA:  Respiratory failure. EXAM: PORTABLE CHEST 1 VIEW COMPARISON:  04/03/2016 FINDINGS: Endotracheal tube tip projects 3 cm above the carina, well positioned. Oral/nasogastric tube passes below the diaphragm well into the stomach. Mild lung base atelectasis. Mild central vascular congestion without overt pulmonary edema. No convincing pneumonia.  Changes from CABG surgery are stable. No pneumothorax. IMPRESSION: 1. Persistent lung base atelectasis. No convincing pneumonia or pulmonary edema. 2. Support apparatus is well positioned. Electronically Signed   By: Lajean Manes M.D.   On: 04/04/2016 09:09   Dg Chest Portable 1 View  04/03/2016  CLINICAL DATA:  82 year old male with a history of intubation. EXAM: PORTABLE CHEST 1 VIEW COMPARISON:  04/03/2016 FINDINGS: Interval placement of endotracheal tube, which terminates suitably above the carina, 3.7 cm. Interval placement of gastric tube terminating out of the field of view. Surgical changes of median sternotomy and CABG. Low lung volumes with likely basilar atelectasis. No pneumothorax.  Chronic interstitial opacities. IMPRESSION: Interval placement of endotracheal tube terminating suitably above the carina. Interval placement of gastric tube turning out of the field of view. Low lung volumes with likely basilar atelectasis. Signed, Dulcy Fanny. Earleen Newport, DO [facility] Electronically Signed   By: Corrie Mckusick D.O.   On: 04/03/2016 14:07   Dg Chest Port 1 View  04/03/2016  CLINICAL DATA:  82 year old presenting with acute onset of shortness of breath. Hypoxia upon arrival to the emergency department. Prior CABG. EXAM: PORTABLE CHEST 1 VIEW COMPARISON:  01/29/2015 and earlier, including CTA chest 08/31/2014. FINDINGS: Prior sternotomy for CABG. Cardiac silhouette moderately enlarged, unchanged. Mild pulmonary venous hypertension without overt edema. Prominent right paracardiac fat pad as noted previously. Linear scarring at the right lung base at the site of prior pneumonia. Lungs otherwise clear. No confluent airspace consolidation. No visible pleural effusions. IMPRESSION: No acute cardiopulmonary disease. Stable moderate cardiomegaly. Mild pulmonary venous hypertension without overt edema. Scarring in the right lung base at the site of prior  pneumonia. Electronically Signed   By: Evangeline Dakin M.D.   On: 04/03/2016 10:39    Scheduled Meds: . antiseptic oral rinse  7 mL Mouth Rinse QID  . chlorhexidine gluconate (SAGE KIT)  15 mL Mouth Rinse BID  . [START ON 04/11/2016] clopidogrel  75 mg Oral Daily  . enoxaparin (LOVENOX) injection  40 mg Subcutaneous Q24H  . gabapentin  300 mg Oral TID  . ipratropium-albuterol  3 mL Nebulization Q6H  . levothyroxine  25 mcg Oral QAC breakfast  . pantoprazole (PROTONIX) IV  40 mg Intravenous QHS  . piperacillin-tazobactam (ZOSYN)  IV  3.375 g Intravenous 3 times per day  . pravastatin  40 mg Oral q1800  . tamsulosin  0.4 mg Oral Daily  . vancomycin  1,250 mg Intravenous Q12H   Continuous Infusions: . sodium chloride 50 mL/hr at 04/05/16 0600  . midazolam (VERSED) infusion Stopped (04/04/16 1040)    Active Problems:  Hyperlipidemia   ALZHEIMERS DISEASE   HYPERTENSION, BENIGN   Acute respiratory failure (HCC)   Acute encephalopathy   Hypothyroidism   Aspiration pneumonia (HCC)   Dementia   Acute respiratory failure with hypoxia ([place])    Time spent: 25 minutes   Jehanzeb Memon. MD [name] Pager [phone]. If 7PM-7AM, please contact night-coverage at [email or web address], password [facility] 04/05/2016, 7:14 AM  LOS: 2 days       By signing my name below, I, Rennis Harding, attest that this documentation has been prepared under the direction and in the presence of Kathie Dike, MD. Electronically signed: Rennis Harding, Scribe. 04/05/2016 9:18am   I, Dr. Kathie Dike, personally performed the services described in this documentaiton. All medical record entries made by the scribe were at my direction and in my presence. I have reviewed the chart and agree that the record reflects my personal performance and is accurate and complete  Kathie Dike, MD, 04/05/2016 9:38 AM

## 2016-04-05 NOTE — Evaluation (Signed)
Clinical/Bedside Swallow Evaluation Patient Details  Name: Dustin [name] MRN: [id number] Date of Birth: October 07, 1934  Today's Date: 04/05/2016 Time: SLP Start Time (ACUTE ONLY): [id number] SLP Stop Time (ACUTE ONLY): 1645 SLP Time Calculation (min) (ACUTE ONLY): 25 min  Past Medical History:  Past Medical History  Diagnosis Date  . Arthritis   . Coronary atherosclerosis of native coronary artery     a. Multivessel s/p CABG 1996. b. s/p DES x 2 SVG to PDA 8/12 with distal disease managed medically.  . Mixed hyperlipidemia   . Essential hypertension, benign   . MI (myocardial infarction) ([place])   . Hematuria   . OSA (obstructive sleep apnea)   . Anxiety   . OA (osteoarthritis)   . DDD (degenerative disc disease)     Chronic back pain  . Alzheimer disease   . Enlarged prostate   . Hypothyroidism   . Atrophic gastritis     a. By EGD 02/2013.   Past Surgical History:  Past Surgical History  Procedure Laterality Date  . Coronary artery bypass graft  1996    LIMA to LAD, SVG to D2, SVG to PDA, SVG to OM1 and OM2  . Hernia repair    . Coronary angioplasty with stent placement    . Colonoscopy  08/03/2004    Jenkins-numerous large diverticula in the descending, transverse, descending, and sigmoid colon. Otherwise normal exam.  . Colonoscopy  07/12/2012    RMR: External hemorrhoidal tag; multiple rectal and colonic polyps removed and/or treated as described above. Pancolonic diverticulosis. Bx-tubular adenomas, rectal hyperplastic polyp. next colonoscopy in 06/2015.  [name] Esophagogastroduodenoscopy N/A 03/16/2013    Procedure: ESOPHAGOGASTRODUODENOSCOPY (EGD);  Surgeon: Daneil Dolin, MD;  Location: [facility];  Service: Endoscopy;  Laterality: N/A;  12:00-moved to [place] notified pt  . Esophagogastroduodenoscopy N/A 03/06/2013    Procedure: ESOPHAGOGASTRODUODENOSCOPY (EGD);  Surgeon: Daneil Dolin, MD;  Location: [facility];  Service: Endoscopy;  Laterality: N/A;   HPI:  Dustin [name] is a 82 y.o. male who is brought to ED by EMS. History is obtained from the medical record and ER reports. Patient was brought to ED today due to development of shortness of breath. He was seen by EMS and was found to have oxygen saturations in the 80's on room air. He received one neb treatment with minimal improvement. On arrival to ED oxygen was applied with improvement of his hypoxia, but patient was initially conversing and awake and through the course of his ER time, became increasingly somnolent. He received one dose of narcan with initial improvement in his mental status, but within a few minutes, he became obtunded again. A second dose of narcan was administered without significant benefit. At that point, there were concerns that he was not protecting his airway, so he was intubated 4/8 for airway protection. Pt was then extubated 4/9 without incident with good saturation on 2L. Chest X-Ray reveals Persistent lung base atelectasis. No convincing pneumonia or pulmonary edema.   Assessment / Plan / Recommendation Clinical Impression  The pt was evaluated at bedside with PO trials and patient presented with raspy, wet sounding voice with low vocal intensity which per grand-daughter is baseline for him. Pt was observed with ice chips and thin liquids (200 cc's) and patient demonstrated occasional wet vocal quailty cleared with cues to provide an additional dry swallow; note some esophageal s/sx including belching and reported "burning and need to clear my throat". Patient then consumed puree and solids  and demonstrated no overt s/sx of aspiration with these textures presented. Recommend regular textures/thin liquids and meds to be administered whole with liquids; recommend patient utilize effective strategies identified during evaluation including additional dry swallow, slow rate and alternating bites and sips. Consider GI consult to address noted esophageal discomfort and observed symptoms. There  are no further ST needs noted at this time. ST to sign off.    Aspiration Risk  Mild aspiration risk    Diet Recommendation Regular;Thin liquid   Liquid Administration via: Cup;Straw Medication Administration: Whole meds with liquid Supervision: Patient able to self feed Compensations: Slow rate;Small sips/bites;Follow solids with liquid;Clear throat intermittently;Multiple dry swallows after each bite/sip Postural Changes: Seated upright at 90 degrees    Other  Recommendations Recommended Consults: Consider GI evaluation Oral Care Recommendations: Oral care BID   Follow up Recommendations  None      Swallow Study   General Date of Onset: 04/03/16 HPI: Dustin [name] is a 82 y.o. male who is brought to ED by EMS. History is obtained from the medical record and ER reports. Patient was brought to ED today due to development of shortness of breath. He was seen by EMS and was found to have oxygen saturations in the 80's on room air. He received one neb treatment with minimal improvement. On arrival to ED oxygen was applied with improvement of his hypoxia, but patient was initially conversing and awake and through the course of his ER time, became increasingly somnolent. He received one dose of narcan with initial improvement in his mental status, but within a few minutes, he became obtunded again. A second dose of narcan was administered without significant benefit. At that point, there were concerns that he was not protecting his airway, so he was intubated 4/8 for airway protection. Pt was then extubated 4/9 without incident with good saturation on 2L. Chest X-Ray reveals Persistent lung base atelectasis. No convincing pneumonia or pulmonary edema. Type of Study: Bedside Swallow Evaluation Temperature Spikes Noted: No Respiratory Status: Nasal cannula History of Recent Intubation: Yes Length of Intubations (days): 1 days Date extubated: 04/04/16 Behavior/Cognition:  Alert;Cooperative;Pleasant mood Oral Cavity Assessment: Within Functional Limits Oral Care Completed by SLP: No Oral Cavity - Dentition: Missing dentition (lower bridge) Vision: Functional for self-feeding Self-Feeding Abilities: Able to feed self Patient Positioning: Upright in bed Baseline Vocal Quality: Hoarse;Low vocal intensity;Wet Volitional Cough: Strong Volitional Swallow: Able to elicit    Oral/Motor/Sensory Function Overall Oral Motor/Sensory Function: Within functional limits   Ice Chips Ice chips: Within functional limits   Thin Liquid Thin Liquid: Impaired Presentation: Cup;Self Fed Pharyngeal  Phase Impairments: Multiple swallows;Wet Vocal Quality    Nectar Thick Nectar Thick Liquid: Not tested   Honey Thick Honey Thick Liquid: Not tested   Puree Puree: Within functional limits   Solid   Amelia H. Roddie Mc, CCC-SLP Speech Language Pathologist   Solid: Within functional limits        Wende Bushy 04/05/2016,5:05 PM

## 2016-04-06 ENCOUNTER — Other Ambulatory Visit: Payer: Self-pay | Admitting: *Deleted

## 2016-04-06 DIAGNOSIS — J9601 Acute respiratory failure with hypoxia: Secondary | ICD-10-CM | POA: Diagnosis not present

## 2016-04-06 DIAGNOSIS — R569 Unspecified convulsions: Secondary | ICD-10-CM | POA: Diagnosis not present

## 2016-04-06 DIAGNOSIS — J189 Pneumonia, unspecified organism: Secondary | ICD-10-CM | POA: Diagnosis not present

## 2016-04-06 DIAGNOSIS — M6281 Muscle weakness (generalized): Secondary | ICD-10-CM | POA: Diagnosis not present

## 2016-04-06 LAB — BASIC METABOLIC PANEL
ANION GAP: 10 (ref 5–15)
BUN: 10 mg/dL (ref 6–20)
CALCIUM: 8.5 mg/dL — AB (ref 8.9–10.3)
CO2: 26 mmol/L (ref 22–32)
Chloride: 107 mmol/L (ref 101–111)
Creatinine, Ser: 1.16 mg/dL (ref 0.61–1.24)
GFR, EST NON AFRICAN AMERICAN: 58 mL/min — AB (ref >60)
GLUCOSE: 125 mg/dL — AB (ref 65–99)
POTASSIUM: 3.4 mmol/L — AB (ref 3.5–5.1)
SODIUM: 143 mmol/L (ref 135–145)

## 2016-04-06 LAB — CULTURE, RESPIRATORY: CULTURE: NORMAL

## 2016-04-06 LAB — CULTURE, RESPIRATORY W GRAM STAIN

## 2016-04-06 LAB — LEGIONELLA PNEUMOPHILA SEROGP 1 UR AG: L. PNEUMOPHILA SEROGP 1 UR AG: NEGATIVE

## 2016-04-06 MED ORDER — IPRATROPIUM-ALBUTEROL 0.5-2.5 (3) MG/3ML IN SOLN: 3.0000 mL | Freq: Two times a day (BID) | RESPIRATORY_TRACT | Status: DC

## 2016-04-06 MED ORDER — ALBUTEROL SULFATE HFA 108 (90 BASE) MCG/ACT IN AERS: 2.0000 | INHALATION_SPRAY | Freq: Four times a day (QID) | RESPIRATORY_TRACT | Status: DC | PRN

## 2016-04-06 MED ORDER — AMOXICILLIN-POT CLAVULANATE 875-125 MG PO TABS
1.0000 | ORAL_TABLET | Freq: Two times a day (BID) | ORAL | Status: DC
Start: 2016-04-06 — End: 2016-12-23

## 2016-04-06 MED ORDER — HYDROXYZINE PAMOATE 25 MG PO CAPS: 100.0000 mg | ORAL_CAPSULE | Freq: Three times a day (TID) | ORAL | Status: DC | PRN

## 2016-04-06 MED ORDER — OXYCODONE HCL 15 MG PO TABS: 7.5000 mg | ORAL_TABLET | ORAL | Status: DC | PRN

## 2016-04-06 NOTE — Discharge Summary (Signed)
Physician Discharge Summary  Dustin [name] [id number] DOB: 12-10-1934 DOA: 04/03/2016  PCP: Glo Herring., MD  Admit date: 04/03/2016 Discharge date: 04/06/2016  Time spent: 35 minutes  Recommendations for Outpatient Follow-up:  1. Follow up with PCP in 1-2 weeks. 2. Follow up CXR in one month for resolution of pneumonia. 3. Recommend patient to see a pain management specialist.     Discharge Diagnoses:  Active Problems:   Hyperlipidemia   ALZHEIMERS DISEASE   HYPERTENSION, BENIGN   Acute respiratory failure (HCC)   Acute encephalopathy   Hypothyroidism   Aspiration pneumonia (HCC)   Dementia   Acute respiratory failure with hypoxia (HCC)   Hypokalemia   Discharge Condition: Improved  Diet recommendation: Heart healthy   Filed Weights   04/04/16 0500 04/05/16 0500 04/06/16 0557  Weight: 93.4 kg (205 lb 14.6 oz) 93.3 kg (205 lb 11 oz) 93.759 kg (206 lb 11.2 oz)    History of present illness:  82 y.o. male who is brought to ED by EMS. Patient is currently intubated and sedated and no family is available. History is obtained from the medical record and ER reports. Patient was brought to ED today due to development of shortness of breath. He was seen by EMS and was found to have oxygen saturations in the 80's on room air. He received one neb treatment with minimal improvement. On arrival to ED oxygen was applied with improvement of his hypoxia, but patient was initially conversing and awake and through the course of his ER time, became increasingly somnolent. He received one dose of narcan with initial improvement in his mental status, but within a few minutes, he became obtunded again. A second dose of narcan was administered without significant benefit. At that point, there were concerns that he was not protecting his airway, so he was intubated for airway protection.  Hospital Course:  Patient was admitted for acute respiratory failure with hypoxia. Etiology was likely  unintentional overdose with narcotics resulting in aspiration. Patient developed aspiration pneumonia. He was placed on mechanical ventilation 4/8 and extubated 4/9 without difficulty. Will continue bronchodilators. Appreciate pulmonology input. While ambulating patient noted to destat to 87% and placed on 1L oxygen. He will discharge with supplemental oxygen. 1. Aspiration pneumonia, improved. Repeat CXR revealed possible RLL PNA. He was initially started on Vancomycin and Zosyn. MRSA PCR negative so Vancomycin discontinued. WBC remained wnl however he was mildly febrile at 100.6 on admission. On discharge he was noted to have remained afebrile for over 48 hours. Blood cultures unremarkable and sputum culture in process. Transitioned to oral abx. Family reports chronic issues with dysphagia and aspiration. ST consulted and he passed a swallow evaluation.   2. Acute encephalopathy. Appears to be at baseline.Likely related to unintentional narcotic overdose. Urinalysis is unremarkable. U-tox is appropriately positive for benzodiazepines. CT head was negative. He reported taking Xanax only at night so this was discontinued. Fentanyl patch was also discontinued and oxycodone dose was reduced. He was started on hydrazine  for anxiety. Recommend he follow up with pain management for further medication adjustment.  3. Hypokalemia, replete. 4. Hypothyroidism. Continue on synthroid 5. Dementia. On namenda and aricept. Appears to be at baseline.  6. Chronic pain. Medication adjusted as above.   7. Anxiety, Xanax discontinued started on Hydrazine   Procedures:  Intubated 4/8>>4/9  Consultations:  Pulmonology   Discharge Exam: Filed Vitals:   04/06/16 0920 04/06/16 0940  BP: 128/44 136/67  Pulse: 52 55  Temp:    Resp:  General: NAD  Cardiovascular: RRR, S1, S2   Respiratory: clear bilaterally, No wheezing, rales or rhonchi  Abdomen: soft, non tender, no distention , bowel sounds  normal  Musculoskeletal: 1+ edema b/l  Discharge Instructions   Discharge Instructions    Diet - low sodium heart healthy    Complete by:  As directed      Increase activity slowly    Complete by:  As directed           Current Discharge Medication List    START taking these medications   Details  albuterol (PROVENTIL HFA;VENTOLIN HFA) 108 (90 Base) MCG/ACT inhaler Inhale 2 puffs into the lungs every 6 (six) hours as needed for wheezing or shortness of breath. Qty: 1 Inhaler, Refills: 2    amoxicillin-clavulanate (AUGMENTIN) 875-125 MG tablet Take 1 tablet by mouth 2 (two) times daily. Qty: 4 tablet, Refills: 0    hydrOXYzine (VISTARIL) 25 MG capsule Take 4 capsules (100 mg total) by mouth 3 (three) times daily as needed for anxiety. Qty: 30 capsule, Refills: 0      CONTINUE these medications which have CHANGED   Details  oxyCODONE (ROXICODONE) 15 MG immediate release tablet Take 0.5 tablets (7.5 mg total) by mouth every 4 (four) hours as needed for pain (*Max of 8 tablets daily as needed every 4 to 6 hours for pain*). Qty: 30 tablet, Refills: 0      CONTINUE these medications which have NOT CHANGED   Details  amLODipine (NORVASC) 10 MG tablet Take 1 tablet by mouth daily.    carvedilol (COREG) 3.125 MG tablet Take 1 tablet by mouth 2 (two) times daily.    clopidogrel (PLAVIX) 75 MG tablet Take 1 tablet (75 mg total) by mouth daily.    donepezil (ARICEPT) 10 MG tablet Take 1 tablet by mouth At bedtime.    escitalopram (LEXAPRO) 10 MG tablet Take 10 mg by mouth daily.    gabapentin (NEURONTIN) 300 MG capsule Take 1 capsule by mouth 3 (three) times daily.    hydrochlorothiazide (MICROZIDE) 12.5 MG capsule Take 1 capsule by mouth daily.    levothyroxine (SYNTHROID, LEVOTHROID) 25 MCG tablet Take 25 mcg by mouth daily before breakfast.    memantine (NAMENDA) 10 MG tablet Take 1 tablet by mouth 2 (two) times daily.    NITROSTAT 0.4 MG SL tablet PLACE ONE TABLET UNDER  TONGUE EVERY 5 MIN UP TO 3 DOSES AS NEEDED FORCHEST PAIN. Qty: 25 tablet, Refills: 3    tamsulosin (FLOMAX) 0.4 MG CAPS capsule Take 1 capsule by mouth daily.    pravastatin (PRAVACHOL) 40 MG tablet TAKE (1) TABLET BY MOUTH AT BEDTIME. Qty: 30 tablet, Refills: 0      STOP taking these medications     ALPRAZolam (XANAX) 1 MG tablet      fentaNYL (DURAGESIC - DOSED MCG/HR) 100 MCG/HR        No Known Allergies Follow-up Information    Follow up with MANN, BENJAMIN, PA-C On 04/13/2016.   Specialties:  Physician Assistant, Internal Medicine   Why:  at 2:15 pm   Contact information:   [address] [place] [id number] [phone]        The results of significant diagnostics from this hospitalization (including imaging, microbiology, ancillary and laboratory) are listed below for reference.    Significant Diagnostic Studies: Ct Head Wo Contrast  04/03/2016  CLINICAL DATA:  Confusion EXAM: CT HEAD WITHOUT CONTRAST TECHNIQUE: Contiguous axial images were obtained from the base of the skull  through the vertex without intravenous contrast. COMPARISON:  08/30/2014 FINDINGS: Bony calvarium is intact. Soft tissue density is noted within the nasal passages consistent with mucus. The paranasal sinuses are within normal limits. Diffuse atrophic changes are seen. No findings to suggest acute hemorrhage, acute infarction or space-occupying mass lesion are noted. IMPRESSION: Chronic atrophic changes without acute intracranial abnormality. Increased soft tissue density within the nasal passages likely related to mucus. Electronically Signed   By: Inez Catalina M.D.   On: 04/03/2016 14:19   Ct Angio Chest Pe W/cm &/or Wo Cm  04/03/2016  CLINICAL DATA:  Confusion and hypoxia EXAM: CT ANGIOGRAPHY CHEST WITH CONTRAST TECHNIQUE: Multidetector CT imaging of the chest was performed using the standard protocol during bolus administration of intravenous contrast. Multiplanar CT image reconstructions  and MIPs were obtained to evaluate the vascular anatomy. CONTRAST:  152mL OMNIPAQUE IOHEXOL 350 MG/ML SOLN COMPARISON:  04/03/2016 FINDINGS: Lungs are well aerated bilaterally with dependent atelectasis in the left lung and similar changes on the right lung with more marked atelectasis in the right lower lobe. This may represent some early infiltrate. No parenchymal nodule or sizable effusion is seen. The thoracic inlet is within normal limits. An endotracheal tube and nasogastric catheter are noted in satisfactory position. The thoracic aorta shows changes of prior coronary bypass grafting. Calcific changes are noted without aneurysmal dilatation or dissection. The pulmonary artery as visualized shows a normal branching pattern. No filling defects to suggest pulmonary emboli are seen. No hilar or mediastinal adenopathy is seen. Heavy coronary calcifications are noted. The visualized upper abdomen is within normal limits. The osseous structures show degenerative change of the thoracic spine. Review of the MIP images confirms the above findings. IMPRESSION: No evidence of pulmonary emboli. Patchy bilateral atelectatic changes worse in the right lower lobe. Tubes and lines as described. Electronically Signed   By: Inez Catalina M.D.   On: 04/03/2016 14:22   Dg Chest Port 1 View  04/05/2016  CLINICAL DATA:  Respiratory failure. EXAM: PORTABLE CHEST 1 VIEW COMPARISON:  04/04/2016 FINDINGS: Since prior study, the endotracheal tube and oral/nasogastric tube have been removed. Changes from CABG surgery and mild cardiomegaly are stable. Streaky opacity in the lung bases, most likely atelectasis, is also stable. No convincing pneumonia or overt pulmonary edema. No apparent pleural effusion and no pneumothorax. IMPRESSION: 1. Status post extubation and removal of the oral/nasogastric tube since the prior exam. 2. No other change.  Persistent basilar atelectasis. Electronically Signed   By: Lajean Manes M.D.   On:  04/05/2016 11:12   Dg Chest Port 1 View  04/04/2016  CLINICAL DATA:  Respiratory failure. EXAM: PORTABLE CHEST 1 VIEW COMPARISON:  04/03/2016 FINDINGS: Endotracheal tube tip projects 3 cm above the carina, well positioned. Oral/nasogastric tube passes below the diaphragm well into the stomach. Mild lung base atelectasis. Mild central vascular congestion without overt pulmonary edema. No convincing pneumonia. Changes from CABG surgery are stable. No pneumothorax. IMPRESSION: 1. Persistent lung base atelectasis. No convincing pneumonia or pulmonary edema. 2. Support apparatus is well positioned. Electronically Signed   By: Lajean Manes M.D.   On: 04/04/2016 09:09   Dg Chest Portable 1 View  04/03/2016  CLINICAL DATA:  82 year old male with a history of intubation. EXAM: PORTABLE CHEST 1 VIEW COMPARISON:  04/03/2016 FINDINGS: Interval placement of endotracheal tube, which terminates suitably above the carina, 3.7 cm. Interval placement of gastric tube terminating out of the field of view. Surgical changes of median sternotomy and CABG. Low lung  volumes with likely basilar atelectasis. No pneumothorax.  Chronic interstitial opacities. IMPRESSION: Interval placement of endotracheal tube terminating suitably above the carina. Interval placement of gastric tube turning out of the field of view. Low lung volumes with likely basilar atelectasis. Signed, Dulcy Fanny. Earleen Newport, DO [facility] Electronically Signed   By: Corrie Mckusick D.O.   On: 04/03/2016 14:07   Dg Chest Port 1 View  04/03/2016  CLINICAL DATA:  82 year old presenting with acute onset of shortness of breath. Hypoxia upon arrival to the emergency department. Prior CABG. EXAM: PORTABLE CHEST 1 VIEW COMPARISON:  01/29/2015 and earlier, including CTA chest 08/31/2014. FINDINGS: Prior sternotomy for CABG. Cardiac silhouette moderately enlarged, unchanged. Mild pulmonary venous hypertension without overt  edema. Prominent right paracardiac fat pad as noted previously. Linear scarring at the right lung base at the site of prior pneumonia. Lungs otherwise clear. No confluent airspace consolidation. No visible pleural effusions. IMPRESSION: No acute cardiopulmonary disease. Stable moderate cardiomegaly. Mild pulmonary venous hypertension without overt edema. Scarring in the right lung base at the site of prior pneumonia. Electronically Signed   By: Evangeline Dakin M.D.   On: 04/03/2016 10:39    Microbiology: Recent Results (from the past 240 hour(s))  Urine culture     Status: None   Collection Time: 04/03/16  1:20 PM  Result Value Ref Range Status   Specimen Description URINE, CATHETERIZED  Final   Special Requests NONE  Final   Culture   Final    NO GROWTH 2 DAYS Performed at [facility]    Report Status 04/05/2016 FINAL  Final  Blood culture (routine x 2)     Status: None (Preliminary result)   Collection Time: 04/03/16  1:28 PM  Result Value Ref Range Status   Specimen Description BLOOD RIGHT HAND  Final   Special Requests BOTTLES DRAWN AEROBIC AND ANAEROBIC 6CC  Final   Culture NO GROWTH 3 DAYS  Final   Report Status PENDING  Incomplete  Blood culture (routine x 2)     Status: None (Preliminary result)   Collection Time: 04/03/16  1:57 PM  Result Value Ref Range Status   Specimen Description BLOOD LEFT HAND  Final   Special Requests BOTTLES DRAWN AEROBIC AND ANAEROBIC 6CC EACH  Final   Culture NO GROWTH 3 DAYS  Final   Report Status PENDING  Incomplete  MRSA PCR Screening     Status: None   Collection Time: 04/03/16  5:00 PM  Result Value Ref Range Status   MRSA by PCR NEGATIVE NEGATIVE Final    Comment:        The GeneXpert MRSA Assay (FDA approved for NASAL specimens only), is one component of a comprehensive MRSA colonization surveillance program. It is not intended to diagnose MRSA infection nor to guide or monitor treatment for MRSA infections.   Culture,  respiratory (tracheal aspirate)     Status: None   Collection Time: 04/03/16 11:46 PM  Result Value Ref Range Status   Specimen Description TRACHEAL ASPIRATE  Final   Special Requests NONE  Final   Gram Stain   Final    FEW WBC PRESENT,BOTH PMN AND MONONUCLEAR FEW SQUAMOUS EPITHELIAL CELLS PRESENT FEW GRAM POSITIVE RODS FEW GRAM POSITIVE COCCI IN CLUSTERS Performed at [facility]    Culture   Final    NORMAL OROPHARYNGEAL FLORA Performed at [facility]    Report Status 04/06/2016 FINAL  Final     Labs: Basic  Metabolic Panel:  Recent Labs Lab 04/03/16 1006 04/04/16 0423 04/05/16 0412 04/06/16 0531  NA 139 141 142 143  K 3.7 3.2* 2.9* 3.4*  CL 101 107 109 107  CO2 31 25 27 26   GLUCOSE 145* 95 92 125*  BUN 15 10 8 10   CREATININE 0.87 0.68 0.70 1.16  CALCIUM 8.5* 7.9* 7.9* 8.5*    Recent Labs Lab 04/03/16 1328  AMMONIA 26   CBC:  Recent Labs Lab 04/03/16 1006 04/04/16 0423 04/05/16 0412  WBC 7.6 8.8 7.9  NEUTROABS 5.9  --   --   HGB 12.5* 12.3* 11.1*  HCT 38.8* 36.6* 34.2*  MCV 94.6 94.3 95.5  PLT 218 140* 160   Cardiac Enzymes:  Recent Labs Lab 04/03/16 1006 04/03/16 1328 04/03/16 1459  CKTOTAL  --  223  --   TROPONINI <0.03  --  <0.03   BNP: BNP (last 3 results)  Recent Labs  04/03/16 1006  BNP 173.0*   CBG:  Recent Labs Lab 04/03/16 1236 04/04/16 0724  GLUCAP 96 100*       Signed:  Kathie Dike, MD  [name] 04/06/2016, 12:13 PM   By signing my name below, I, Rennis Harding, attest that this documentation has been prepared under the direction and in the presence of Kathie Dike, MD. Electronically signed: Rennis Harding, Scribe. 04/06/2016 10:05am   I, Dr. Kathie Dike, personally performed the services described in this documentaiton. All medical record entries made by the scribe were at my direction and in my presence. I have reviewed the chart and agree that the record reflects my  personal performance and is accurate and complete  Kathie Dike, MD, 04/06/2016 12:13 PM

## 2016-04-06 NOTE — Care Management Note (Signed)
Case Management Note  Patient Details  Name: BRIJ VANVACTOR MRN: [id number] Date of Birth: 12-25-1934  Subjective/Objective:    Spoke with patient for discharge planning. Patient will need home O2 referral placed with [facility] at [place]. Patient will have $26.50 co pay which he stated is ok . Choice of Home Health provider offered and patient believes he has had [facility] prior  And would like to continue with their service but only if no co pay. Stated last time his spouse had it it cost $700.  Patient lives at home with spouse and does not use [facility] or cane.  [place] RN setup for patient as he is new to Home O2. [place] for at advacned for referral             Action/Plan: Home with Home Health.  Expected Discharge Date:                  Expected Discharge Plan:  [place]  In-House Referral:     Discharge planning Services  CM Consult  Post Acute Care Choice:    Choice offered to:     DME Arranged:  Oxygen DME Agency:  [facility]  HH Arranged:  RN [place] Agency:  [place]  Status of Service:     Medicare Important Message Given:    Date Medicare IM Given:    Medicare IM give by:    Date Additional Medicare IM Given:    Additional Medicare Important Message give by:     If discussed at [place] of Stay Meetings, dates discussed:    Additional Comments:  Alvie Heidelberg, RN 04/06/2016, 11:35 AM

## 2016-04-06 NOTE — Care Management Important Message (Signed)
Important Message  Patient Details  Name: Dustin [name] MRN: [id number] Date of Birth: [birth year]   Medicare Important Message Given:  Yes    Alvie Heidelberg, RN 04/06/2016, 12:53 PM

## 2016-04-06 NOTE — Progress Notes (Signed)
He says he feels great and wants to go home. He has no new complaints. His exam shows that he has somewhat diminished breath sounds and perhaps some rales in the right base. I will plan to sign off at this point. He will need a chest x-ray to document complete clearing of his infiltrate probably in about a month.

## 2016-04-06 NOTE — Progress Notes (Signed)
   04/06/16 0903  Vitals  Patient Position (if appropriate) Lying  Pulse Rate 70  Oxygen Therapy  SpO2 (!) 88 %  O2 Device Room [employer]

## 2016-04-06 NOTE — Progress Notes (Signed)
Pharmacy Antibiotic Note:  Follow up note  Dustin [name] is a 82 y.o. male admitted on 04/03/2016 with sepsis.  Pharmacy has been consulted for ZOSYN dosing.  Plan: Zosyn 3.375gm IV q8h, EID Monitor labs, progress, c/s Deescalate to PO ABX when improved / appropriate  Height: 5\' 10"  (177.8 cm) Weight: 206 lb 11.2 oz (93.759 kg) IBW/kg (Calculated) : 73  Temp (24hrs), Avg:98.5 F (36.9 C), Min:98.3 F (36.8 C), Max:98.6 F (37 C)   Recent Labs Lab 04/03/16 1006 04/03/16 1328 04/04/16 0423 04/05/16 0412 04/06/16 0531  WBC 7.6  --  8.8 7.9  --   CREATININE 0.87  --  0.68 0.70 1.16  LATICACIDVEN  --  1.2  --   --   --     Estimated Creatinine Clearance: 59.4 mL/min (by C-G formula based on Cr of 1.16).    No Known Allergies  Antimicrobials this admission: Vancomycin 4/8 >>4/9 Zosyn 4/8 >>   Microbiology results: 4/8 BCx: pending, ngtd 4/8 UCx: (-) 4/8 Sputum: polymicrobial 4/8 MRSA PCR (-)  Thank you for allowing pharmacy to be a part of this patient's care.  Hart Robinsons, PharmD Clinical Pharmacist  04/06/2016 10:55 AM

## 2016-04-06 NOTE — Evaluation (Signed)
Physical Therapy Evaluation Patient Details Name: Dustin [name] MRN: [id number] DOB: 1934-09-29 Today's Date: 04/06/2016   History of Present Illness  Dustin [name] is a 82 y.o. male who is brought to ED by EMS. Patient is currently intubated and sedated and no family is available. History is obtained from the medical record and ER reports. Patient was brought to ED today due to development of shortness of breath. He was seen by EMS and was found to have oxygen saturations in the 80's on room air. He received one neb treatment with minimal improvement. On arrival to ED oxygen was applied with improvement of his hypoxia, but patient was initially conversing and awake and through the course of his ER time, became increasingly somnolent. He received one dose of narcan with initial improvement in his mental status, but within a few minutes, he became obtunded again. A second dose of narcan was administered without significant benefit. At that point, there were concerns that he was not protecting his airway, so he was intubated for airway protection.  Dx: acute respiratory failure, ? Aspiration pneumonia, acute encephalopathy, ?seizures.  Pt extubated on 04/04/2016.  Head CT (-) for acute changes.   Clinical Impression  Pt was received in the bed, and was eager to mobilize today, therefore was agreeable to PT evaluation.  Pt states that he was independent with ADL's and IADL's, and not using any AD prior to admission.  Currently, pt required CGA for bed mobility, as well as transfers, and gait.  He was able to ambulate 119ft with RW and CGA due to poor safety awareness.  Pt would benefit from continued skilled acute PT, as well as continuation from OPPT to promote strength, balance, and endurance to optimize his return to PLOF.  Pt states he may prefer HHPT due to possible difficulty with transportation to [place].      Follow Up Recommendations Outpatient PT;Home health PT (Pt thinks HHPT would be easier for  him, and would like to know the cost.  Spoke with SW regarding this. )    Equipment Recommendations  None recommended by PT    Recommendations for Other Services       Precautions / Restrictions Precautions Precautions: Fall Restrictions Weight Bearing Restrictions: No      Mobility  Bed Mobility Overal bed mobility: Needs Assistance Bed Mobility: Supine to Sit     Supine to sit: Min guard;HOB elevated     General bed mobility comments: Pt required increased time, as well as vc's for hand placement.  Pt encouraged to push himself up from the bed instead of reaching for the therapist to pull him up.    Transfers Overall transfer level: Needs assistance Equipment used: Rolling walker (2 wheeled) Transfers: Sit to/from Stand Sit to Stand: Min guard         General transfer comment: Pt required vc's for safe hand placement to push up from the bed.  Pt again needed vc's for safe hand placement when transferring stand<>sit - poor safety awareness as pt did not get all the way lined up with the chair prior to sitting despite cues to do so.    Ambulation/Gait Ambulation/Gait assistance: Min guard Ambulation Distance (Feet): 100 Feet Assistive device: Rolling walker (2 wheeled) Gait Pattern/deviations: Step-through pattern   Gait velocity interpretation: <1.8 ft/sec, indicative of risk for recurrent falls General Gait Details: Pt demonstrates decreased cadence, and needed vc's to stay on task due to high distractability and pt looking for his sister in  law who he believes is also in the hospital.    Stairs            Wheelchair Mobility    Modified Rankin (Stroke Patients Only)       Balance Overall balance assessment: Needs assistance Sitting-balance support: Single extremity supported Sitting balance-Leahy Scale: Good     Standing balance support: Bilateral upper extremity supported Standing balance-Leahy Scale: Fair Standing balance comment: Pt continues  to require CGA for safety with dynamic standing activities due to high distractibility and poor safety awareness.                             Pertinent Vitals/Pain Pain Assessment: No/denies pain    Home Living Family/patient expects to be discharged to:: Private residence Living Arrangements: Spouse/significant other Available Help at Discharge: Family (brother lives nearby) Type of Home: House Home Access: Level entry     Home Layout: One level Home Equipment: Environmental consultant - 2 wheels;Cane - quad Additional Comments: Pt was not using any AD prior to admission, however he does have access to a RW and an cane if needed.  At this date, it is recommended he use a RW.    Prior Function Level of Independence: Independent         Comments: Pt states he was able to perform his own ADL's and IADL's.  It is noted in the chart that his brother states he hurt his neck after sleeping in a recliner, and has been unable to transfer/ambulate without assistance secondary to pain.       Hand Dominance        Extremity/Trunk Assessment   Upper Extremity Assessment: Generalized weakness           Lower Extremity Assessment: Generalized weakness         Communication   Communication: Other (comment) (Pt requires increased time, and repetition to re-direct to task at hand. )  Cognition Arousal/Alertness: Awake/alert   Overall Cognitive Status: History of cognitive impairments - at baseline (Pt has dementia at baseline, and requires repetition of commands and re-direction to stay on task)                      General Comments      Exercises        Assessment/Plan    PT Assessment Patient needs continued PT services  PT Diagnosis Generalized weakness   PT Problem List Decreased strength;Decreased activity tolerance;Decreased balance;Decreased mobility;Decreased knowledge of use of DME;Decreased safety awareness  PT Treatment Interventions Gait  training;Functional mobility training;Therapeutic activities;Therapeutic exercise;Balance training;Patient/family education   PT Goals (Current goals can be found in the Care Plan section) Acute Rehab PT Goals Patient Stated Goal: Pt wants to get back home.  PT Goal Formulation: With patient Time For Goal Achievement: 04/20/16 Potential to Achieve Goals: Good    Frequency Min 3X/week   Barriers to discharge        Co-evaluation               End of Session Equipment Utilized During Treatment: Gait belt Activity Tolerance: Patient tolerated treatment well Patient left: in chair;with call bell/phone within reach Nurse Communication: Mobility status (RN notified of pt's location and that he is now on RA.  )         [id number] PT Time Calculation (min) (ACUTE ONLY): 25 min   Charges:   PT Evaluation $PT Eval Moderate Complexity: 1  Procedure PT Treatments $Gait Training: 8-22 mins   PT G Codes:        Beth Mathias Bogacki, PT, DPT X: [id number]  04/06/2016, 11:16 AM

## 2016-04-06 NOTE — Progress Notes (Signed)
04/05/16 RT placed Pt on RA and his SATS were 87-90%, RT placed the Pt on 1L [place] and his SATS were 90-94%.  RT placed Pt on RA on 04/06/16 and checked his SATS and it was 87-90% on RA. RT placed the Pt back on 1L . Pt needs to ambulate and sit in a chair.

## 2016-04-06 NOTE — Consult Note (Signed)
   [facility] CM Inpatient Consult   04/06/2016  RON HAAB [birth year] [id number]  Patient evaluated for community based chronic disease management services with [place] Management Program as a benefit of patient's [employer]. Spoke with patient at bedside and wife, Everlene Farrier via telephone [phone],to explain [place] Management services.  They both agree to [facility] program services, consent obtained at bedside. Patient will receive post hospital discharge call and will be evaluated for monthly home visits for assessments and disease process education.  Left contact information and [facility] literature at bedside.  Made Inpatient Case Manager aware that [place] Management following.  Of note, [facility] Care Management services does not replace or interfere with any services that are arranged by inpatient case management or social work.   For additional questions or referrals please contact  Royetta Crochet. Laymond Purser, RN, BSN, [place] [phone]) Business Cell  [phone]) Toll Free Office

## 2016-04-06 NOTE — Progress Notes (Signed)
Pt's IV catheter removed and intact. Pt's IV site clean dry and intact. Discharge instructions, medications and follow up appointments were discussed with patient's wife. [name] verbalized understanding of discharge instructions including medications and follow up appointments. All questions were answered and no further questions at this time. Pt in stable condition and in no acute distress at time of discharge. Pt escorted by nurse tech.

## 2016-04-07 ENCOUNTER — Encounter: Payer: Self-pay | Admitting: *Deleted

## 2016-04-07 ENCOUNTER — Other Ambulatory Visit: Payer: Self-pay | Admitting: *Deleted

## 2016-04-07 DIAGNOSIS — G4733 Obstructive sleep apnea (adult) (pediatric): Secondary | ICD-10-CM | POA: Diagnosis not present

## 2016-04-07 DIAGNOSIS — Z7901 Long term (current) use of anticoagulants: Secondary | ICD-10-CM | POA: Diagnosis not present

## 2016-04-07 DIAGNOSIS — J9620 Acute and chronic respiratory failure, unspecified whether with hypoxia or hypercapnia: Secondary | ICD-10-CM | POA: Diagnosis not present

## 2016-04-07 DIAGNOSIS — F0280 Dementia in other diseases classified elsewhere without behavioral disturbance: Secondary | ICD-10-CM | POA: Diagnosis not present

## 2016-04-07 DIAGNOSIS — G301 Alzheimer's disease with late onset: Secondary | ICD-10-CM | POA: Diagnosis not present

## 2016-04-07 DIAGNOSIS — E039 Hypothyroidism, unspecified: Secondary | ICD-10-CM | POA: Diagnosis not present

## 2016-04-07 DIAGNOSIS — I10 Essential (primary) hypertension: Secondary | ICD-10-CM | POA: Diagnosis not present

## 2016-04-07 DIAGNOSIS — Z9981 Dependence on supplemental oxygen: Secondary | ICD-10-CM | POA: Diagnosis not present

## 2016-04-07 DIAGNOSIS — M1990 Unspecified osteoarthritis, unspecified site: Secondary | ICD-10-CM | POA: Diagnosis not present

## 2016-04-07 DIAGNOSIS — Z7951 Long term (current) use of inhaled steroids: Secondary | ICD-10-CM | POA: Diagnosis not present

## 2016-04-07 DIAGNOSIS — I2510 Atherosclerotic heart disease of native coronary artery without angina pectoris: Secondary | ICD-10-CM | POA: Diagnosis not present

## 2016-04-07 DIAGNOSIS — F419 Anxiety disorder, unspecified: Secondary | ICD-10-CM | POA: Diagnosis not present

## 2016-04-07 DIAGNOSIS — E782 Mixed hyperlipidemia: Secondary | ICD-10-CM | POA: Diagnosis not present

## 2016-04-07 DIAGNOSIS — J690 Pneumonitis due to inhalation of food and vomit: Secondary | ICD-10-CM | POA: Diagnosis not present

## 2016-04-07 DIAGNOSIS — Z95818 Presence of other cardiac implants and grafts: Secondary | ICD-10-CM | POA: Diagnosis not present

## 2016-04-07 NOTE — Patient Outreach (Signed)
[name] scientific officer of Care Outreach/Week #1  04/07/2016  [name] 1934/08/26 [id number]  [name] is a 82 year old gentleman who was recently admitted to the hospital with aspiration pneumonia, acute encephalopathy likely secondary to unintentional medication overdose (Xanax which was discontinued, Vistaril prescribed for anxiety), and hypokalemia. [name] has dementia and is cared for at home by his wife. [name] brother lives in the immediate vicinity and also provides support.   I spoke with Dustin [name] by phone today to provide transition of care assessment. [name] states he feels better according to [name]. She does report that [name] "had a little bit of fever yesterday" but states he was evaluated by the home health nurse today and was afebrile.   [name] agreed to continued calls for transition of care assessments.   Plan: I will notify Dustin [name] of [name]'s wife's report of his low grade fever. I will reach out to [name] again by phone next week.    Dustin [name]  [phone]

## 2016-04-08 LAB — CULTURE, BLOOD (ROUTINE X 2)
CULTURE: NO GROWTH
Culture: NO GROWTH

## 2016-04-09 DIAGNOSIS — F419 Anxiety disorder, unspecified: Secondary | ICD-10-CM | POA: Diagnosis not present

## 2016-04-09 DIAGNOSIS — E039 Hypothyroidism, unspecified: Secondary | ICD-10-CM | POA: Diagnosis not present

## 2016-04-09 DIAGNOSIS — Z7951 Long term (current) use of inhaled steroids: Secondary | ICD-10-CM | POA: Diagnosis not present

## 2016-04-09 DIAGNOSIS — F0280 Dementia in other diseases classified elsewhere without behavioral disturbance: Secondary | ICD-10-CM | POA: Diagnosis not present

## 2016-04-09 DIAGNOSIS — Z7901 Long term (current) use of anticoagulants: Secondary | ICD-10-CM | POA: Diagnosis not present

## 2016-04-09 DIAGNOSIS — G301 Alzheimer's disease with late onset: Secondary | ICD-10-CM | POA: Diagnosis not present

## 2016-04-09 DIAGNOSIS — I10 Essential (primary) hypertension: Secondary | ICD-10-CM | POA: Diagnosis not present

## 2016-04-09 DIAGNOSIS — I2510 Atherosclerotic heart disease of native coronary artery without angina pectoris: Secondary | ICD-10-CM | POA: Diagnosis not present

## 2016-04-09 DIAGNOSIS — Z9981 Dependence on supplemental oxygen: Secondary | ICD-10-CM | POA: Diagnosis not present

## 2016-04-09 DIAGNOSIS — E782 Mixed hyperlipidemia: Secondary | ICD-10-CM | POA: Diagnosis not present

## 2016-04-09 DIAGNOSIS — M1990 Unspecified osteoarthritis, unspecified site: Secondary | ICD-10-CM | POA: Diagnosis not present

## 2016-04-09 DIAGNOSIS — J690 Pneumonitis due to inhalation of food and vomit: Secondary | ICD-10-CM | POA: Diagnosis not present

## 2016-04-09 DIAGNOSIS — Z95818 Presence of other cardiac implants and grafts: Secondary | ICD-10-CM | POA: Diagnosis not present

## 2016-04-09 DIAGNOSIS — G4733 Obstructive sleep apnea (adult) (pediatric): Secondary | ICD-10-CM | POA: Diagnosis not present

## 2016-04-12 DIAGNOSIS — E782 Mixed hyperlipidemia: Secondary | ICD-10-CM | POA: Diagnosis not present

## 2016-04-12 DIAGNOSIS — M1990 Unspecified osteoarthritis, unspecified site: Secondary | ICD-10-CM | POA: Diagnosis not present

## 2016-04-12 DIAGNOSIS — Z7901 Long term (current) use of anticoagulants: Secondary | ICD-10-CM | POA: Diagnosis not present

## 2016-04-12 DIAGNOSIS — F419 Anxiety disorder, unspecified: Secondary | ICD-10-CM | POA: Diagnosis not present

## 2016-04-12 DIAGNOSIS — J690 Pneumonitis due to inhalation of food and vomit: Secondary | ICD-10-CM | POA: Diagnosis not present

## 2016-04-12 DIAGNOSIS — I2510 Atherosclerotic heart disease of native coronary artery without angina pectoris: Secondary | ICD-10-CM | POA: Diagnosis not present

## 2016-04-12 DIAGNOSIS — I10 Essential (primary) hypertension: Secondary | ICD-10-CM | POA: Diagnosis not present

## 2016-04-12 DIAGNOSIS — E039 Hypothyroidism, unspecified: Secondary | ICD-10-CM | POA: Diagnosis not present

## 2016-04-12 DIAGNOSIS — Z95818 Presence of other cardiac implants and grafts: Secondary | ICD-10-CM | POA: Diagnosis not present

## 2016-04-12 DIAGNOSIS — F0280 Dementia in other diseases classified elsewhere without behavioral disturbance: Secondary | ICD-10-CM | POA: Diagnosis not present

## 2016-04-12 DIAGNOSIS — Z7951 Long term (current) use of inhaled steroids: Secondary | ICD-10-CM | POA: Diagnosis not present

## 2016-04-12 DIAGNOSIS — G301 Alzheimer's disease with late onset: Secondary | ICD-10-CM | POA: Diagnosis not present

## 2016-04-12 DIAGNOSIS — G4733 Obstructive sleep apnea (adult) (pediatric): Secondary | ICD-10-CM | POA: Diagnosis not present

## 2016-04-12 DIAGNOSIS — Z9981 Dependence on supplemental oxygen: Secondary | ICD-10-CM | POA: Diagnosis not present

## 2016-04-12 MED FILL — Medication: Qty: 1 | Status: AC

## 2016-04-13 DIAGNOSIS — Z6827 Body mass index (BMI) 27.0-27.9, adult: Secondary | ICD-10-CM | POA: Diagnosis not present

## 2016-04-13 DIAGNOSIS — J189 Pneumonia, unspecified organism: Secondary | ICD-10-CM | POA: Diagnosis not present

## 2016-04-13 DIAGNOSIS — G894 Chronic pain syndrome: Secondary | ICD-10-CM | POA: Diagnosis not present

## 2016-04-13 DIAGNOSIS — E876 Hypokalemia: Secondary | ICD-10-CM | POA: Diagnosis not present

## 2016-04-13 DIAGNOSIS — Z1389 Encounter for screening for other disorder: Secondary | ICD-10-CM | POA: Diagnosis not present

## 2016-04-15 DIAGNOSIS — E039 Hypothyroidism, unspecified: Secondary | ICD-10-CM | POA: Diagnosis not present

## 2016-04-15 DIAGNOSIS — G4733 Obstructive sleep apnea (adult) (pediatric): Secondary | ICD-10-CM | POA: Diagnosis not present

## 2016-04-15 DIAGNOSIS — E782 Mixed hyperlipidemia: Secondary | ICD-10-CM | POA: Diagnosis not present

## 2016-04-15 DIAGNOSIS — I10 Essential (primary) hypertension: Secondary | ICD-10-CM | POA: Diagnosis not present

## 2016-04-15 DIAGNOSIS — Z95818 Presence of other cardiac implants and grafts: Secondary | ICD-10-CM | POA: Diagnosis not present

## 2016-04-15 DIAGNOSIS — J690 Pneumonitis due to inhalation of food and vomit: Secondary | ICD-10-CM | POA: Diagnosis not present

## 2016-04-15 DIAGNOSIS — F0280 Dementia in other diseases classified elsewhere without behavioral disturbance: Secondary | ICD-10-CM | POA: Diagnosis not present

## 2016-04-15 DIAGNOSIS — Z9981 Dependence on supplemental oxygen: Secondary | ICD-10-CM | POA: Diagnosis not present

## 2016-04-15 DIAGNOSIS — I2510 Atherosclerotic heart disease of native coronary artery without angina pectoris: Secondary | ICD-10-CM | POA: Diagnosis not present

## 2016-04-15 DIAGNOSIS — F419 Anxiety disorder, unspecified: Secondary | ICD-10-CM | POA: Diagnosis not present

## 2016-04-15 DIAGNOSIS — Z7901 Long term (current) use of anticoagulants: Secondary | ICD-10-CM | POA: Diagnosis not present

## 2016-04-15 DIAGNOSIS — M1990 Unspecified osteoarthritis, unspecified site: Secondary | ICD-10-CM | POA: Diagnosis not present

## 2016-04-15 DIAGNOSIS — G301 Alzheimer's disease with late onset: Secondary | ICD-10-CM | POA: Diagnosis not present

## 2016-04-15 DIAGNOSIS — Z7951 Long term (current) use of inhaled steroids: Secondary | ICD-10-CM | POA: Diagnosis not present

## 2016-04-19 DIAGNOSIS — Z7901 Long term (current) use of anticoagulants: Secondary | ICD-10-CM | POA: Diagnosis not present

## 2016-04-19 DIAGNOSIS — Z95818 Presence of other cardiac implants and grafts: Secondary | ICD-10-CM | POA: Diagnosis not present

## 2016-04-19 DIAGNOSIS — Z9981 Dependence on supplemental oxygen: Secondary | ICD-10-CM | POA: Diagnosis not present

## 2016-04-19 DIAGNOSIS — M1990 Unspecified osteoarthritis, unspecified site: Secondary | ICD-10-CM | POA: Diagnosis not present

## 2016-04-19 DIAGNOSIS — F419 Anxiety disorder, unspecified: Secondary | ICD-10-CM | POA: Diagnosis not present

## 2016-04-19 DIAGNOSIS — I10 Essential (primary) hypertension: Secondary | ICD-10-CM | POA: Diagnosis not present

## 2016-04-19 DIAGNOSIS — F0280 Dementia in other diseases classified elsewhere without behavioral disturbance: Secondary | ICD-10-CM | POA: Diagnosis not present

## 2016-04-19 DIAGNOSIS — I2510 Atherosclerotic heart disease of native coronary artery without angina pectoris: Secondary | ICD-10-CM | POA: Diagnosis not present

## 2016-04-19 DIAGNOSIS — J690 Pneumonitis due to inhalation of food and vomit: Secondary | ICD-10-CM | POA: Diagnosis not present

## 2016-04-19 DIAGNOSIS — G4733 Obstructive sleep apnea (adult) (pediatric): Secondary | ICD-10-CM | POA: Diagnosis not present

## 2016-04-19 DIAGNOSIS — E039 Hypothyroidism, unspecified: Secondary | ICD-10-CM | POA: Diagnosis not present

## 2016-04-19 DIAGNOSIS — G301 Alzheimer's disease with late onset: Secondary | ICD-10-CM | POA: Diagnosis not present

## 2016-04-19 DIAGNOSIS — Z7951 Long term (current) use of inhaled steroids: Secondary | ICD-10-CM | POA: Diagnosis not present

## 2016-04-19 DIAGNOSIS — E782 Mixed hyperlipidemia: Secondary | ICD-10-CM | POA: Diagnosis not present

## 2016-04-20 DIAGNOSIS — I2510 Atherosclerotic heart disease of native coronary artery without angina pectoris: Secondary | ICD-10-CM | POA: Diagnosis not present

## 2016-04-20 DIAGNOSIS — J690 Pneumonitis due to inhalation of food and vomit: Secondary | ICD-10-CM | POA: Diagnosis not present

## 2016-04-20 DIAGNOSIS — M1990 Unspecified osteoarthritis, unspecified site: Secondary | ICD-10-CM | POA: Diagnosis not present

## 2016-04-20 DIAGNOSIS — E039 Hypothyroidism, unspecified: Secondary | ICD-10-CM | POA: Diagnosis not present

## 2016-04-20 DIAGNOSIS — F419 Anxiety disorder, unspecified: Secondary | ICD-10-CM | POA: Diagnosis not present

## 2016-04-20 DIAGNOSIS — G301 Alzheimer's disease with late onset: Secondary | ICD-10-CM | POA: Diagnosis not present

## 2016-04-20 DIAGNOSIS — Z95818 Presence of other cardiac implants and grafts: Secondary | ICD-10-CM | POA: Diagnosis not present

## 2016-04-20 DIAGNOSIS — Z7901 Long term (current) use of anticoagulants: Secondary | ICD-10-CM | POA: Diagnosis not present

## 2016-04-20 DIAGNOSIS — Z9981 Dependence on supplemental oxygen: Secondary | ICD-10-CM | POA: Diagnosis not present

## 2016-04-20 DIAGNOSIS — I10 Essential (primary) hypertension: Secondary | ICD-10-CM | POA: Diagnosis not present

## 2016-04-20 DIAGNOSIS — Z7951 Long term (current) use of inhaled steroids: Secondary | ICD-10-CM | POA: Diagnosis not present

## 2016-04-20 DIAGNOSIS — G4733 Obstructive sleep apnea (adult) (pediatric): Secondary | ICD-10-CM | POA: Diagnosis not present

## 2016-04-20 DIAGNOSIS — E782 Mixed hyperlipidemia: Secondary | ICD-10-CM | POA: Diagnosis not present

## 2016-04-20 DIAGNOSIS — F0280 Dementia in other diseases classified elsewhere without behavioral disturbance: Secondary | ICD-10-CM | POA: Diagnosis not present

## 2016-04-20 NOTE — Procedures (Signed)
  [place] A. Merlene Laughter, MD     [email or web address]           HISTORY: The patient is a 82 year old man who presents with confusion and altered mental status. This does be done to evaluate for seizures as a cause of these episodes.  MEDICATIONS: Scheduled Meds: Continuous Infusions: PRN Meds:.  Prior to Admission medications   Medication Sig Start Date End Date Taking? Authorizing Provider  albuterol (PROVENTIL HFA;VENTOLIN HFA) 108 (90 Base) MCG/ACT inhaler Inhale 2 puffs into the lungs every 6 (six) hours as needed for wheezing or shortness of breath. 04/06/16   Kathie Dike, MD  amLODipine (NORVASC) 10 MG tablet Take 1 tablet by mouth daily. 03/05/16   Historical Provider, MD  amoxicillin-clavulanate (AUGMENTIN) 875-125 MG tablet Take 1 tablet by mouth 2 (two) times daily. 04/06/16   Kathie Dike, MD  carvedilol (COREG) 3.125 MG tablet Take 1 tablet by mouth 2 (two) times daily. 08/06/14   Historical Provider, MD  clopidogrel (PLAVIX) 75 MG tablet Take 1 tablet (75 mg total) by mouth daily. 01/29/15   Hosie Poisson, MD  donepezil (ARICEPT) 10 MG tablet Take 1 tablet by mouth At bedtime. 07/01/12   Historical Provider, MD  escitalopram (LEXAPRO) 10 MG tablet Take 10 mg by mouth daily.    Historical Provider, MD  gabapentin (NEURONTIN) 300 MG capsule Take 1 capsule by mouth 3 (three) times daily. 01/08/16   Historical Provider, MD  hydrochlorothiazide (MICROZIDE) 12.5 MG capsule Take 1 capsule by mouth daily. 03/05/16   Historical Provider, MD  hydrOXYzine (VISTARIL) 25 MG capsule Take 4 capsules (100 mg total) by mouth 3 (three) times daily as needed for anxiety. 04/06/16   Kathie Dike, MD  levothyroxine (SYNTHROID, LEVOTHROID) 25 MCG tablet Take 25 mcg by mouth daily before breakfast.    Historical Provider, MD  memantine (NAMENDA) 10 MG tablet Take 1 tablet by mouth 2 (two) times daily. 03/05/16   Historical Provider, MD  NITROSTAT 0.4 MG SL tablet PLACE ONE TABLET UNDER  TONGUE EVERY 5 MIN UP TO 3 DOSES AS NEEDED FORCHEST PAIN. 08/12/15   Lendon Colonel, NP  oxyCODONE (ROXICODONE) 15 MG immediate release tablet Take 0.5 tablets (7.5 mg total) by mouth every 4 (four) hours as needed for pain Bennie Pierini of 8 tablets daily as needed every 4 to 6 hours for pain*). 04/06/16   Kathie Dike, MD  pravastatin (PRAVACHOL) 40 MG tablet TAKE (1) TABLET BY MOUTH AT BEDTIME. 04/05/16   Lendon Colonel, NP  tamsulosin (FLOMAX) 0.4 MG CAPS capsule Take 1 capsule by mouth daily. 02/23/16   Historical Provider, MD      ANALYSIS: A 16 channel recording using standard 10 20 measurements is conducted for 21 minutes. There is a well-formed posterior dominant rhythm of 9 Hz which attenuates with eye opening. There is beta activity observed in the frontal areas. Awake and drowsy activities are observed. For the situation and hyperventilation are not carried out. There is no focal relaxer slowing. There is no epileptiform activities observed.     IMPRESSION: This is a normal recording of the awake and drowsy states.      Makyi Ledo A. Merlene Laughter, M.D.  Diplomate, Tax adviser of Psychiatry and Neurology ( Neurology).

## 2016-04-22 DIAGNOSIS — Z7951 Long term (current) use of inhaled steroids: Secondary | ICD-10-CM | POA: Diagnosis not present

## 2016-04-22 DIAGNOSIS — G301 Alzheimer's disease with late onset: Secondary | ICD-10-CM | POA: Diagnosis not present

## 2016-04-22 DIAGNOSIS — Z7901 Long term (current) use of anticoagulants: Secondary | ICD-10-CM | POA: Diagnosis not present

## 2016-04-22 DIAGNOSIS — I2510 Atherosclerotic heart disease of native coronary artery without angina pectoris: Secondary | ICD-10-CM | POA: Diagnosis not present

## 2016-04-22 DIAGNOSIS — F419 Anxiety disorder, unspecified: Secondary | ICD-10-CM | POA: Diagnosis not present

## 2016-04-22 DIAGNOSIS — Z95818 Presence of other cardiac implants and grafts: Secondary | ICD-10-CM | POA: Diagnosis not present

## 2016-04-22 DIAGNOSIS — E063 Autoimmune thyroiditis: Secondary | ICD-10-CM | POA: Diagnosis not present

## 2016-04-22 DIAGNOSIS — M1990 Unspecified osteoarthritis, unspecified site: Secondary | ICD-10-CM | POA: Diagnosis not present

## 2016-04-22 DIAGNOSIS — F0280 Dementia in other diseases classified elsewhere without behavioral disturbance: Secondary | ICD-10-CM | POA: Diagnosis not present

## 2016-04-22 DIAGNOSIS — G4733 Obstructive sleep apnea (adult) (pediatric): Secondary | ICD-10-CM | POA: Diagnosis not present

## 2016-04-22 DIAGNOSIS — E039 Hypothyroidism, unspecified: Secondary | ICD-10-CM | POA: Diagnosis not present

## 2016-04-22 DIAGNOSIS — Z6827 Body mass index (BMI) 27.0-27.9, adult: Secondary | ICD-10-CM | POA: Diagnosis not present

## 2016-04-22 DIAGNOSIS — E782 Mixed hyperlipidemia: Secondary | ICD-10-CM | POA: Diagnosis not present

## 2016-04-22 DIAGNOSIS — Z9981 Dependence on supplemental oxygen: Secondary | ICD-10-CM | POA: Diagnosis not present

## 2016-04-22 DIAGNOSIS — I10 Essential (primary) hypertension: Secondary | ICD-10-CM | POA: Diagnosis not present

## 2016-04-22 DIAGNOSIS — J690 Pneumonitis due to inhalation of food and vomit: Secondary | ICD-10-CM | POA: Diagnosis not present

## 2016-04-22 DIAGNOSIS — G894 Chronic pain syndrome: Secondary | ICD-10-CM | POA: Diagnosis not present

## 2016-04-22 DIAGNOSIS — M1991 Primary osteoarthritis, unspecified site: Secondary | ICD-10-CM | POA: Diagnosis not present

## 2016-04-23 ENCOUNTER — Other Ambulatory Visit: Payer: Self-pay | Admitting: *Deleted

## 2016-04-23 NOTE — Patient Outreach (Signed)
[facility] [facility]) Care Management  04/23/2016  Dustin [name] 10-Dec-1934 [id number]  Unable to reach [name] by phone this afternoon. I'll call him again on Monday to continue transition of care assessments.    [place] Management  [phone]

## 2016-04-26 DIAGNOSIS — F419 Anxiety disorder, unspecified: Secondary | ICD-10-CM | POA: Diagnosis not present

## 2016-04-26 DIAGNOSIS — Z95818 Presence of other cardiac implants and grafts: Secondary | ICD-10-CM | POA: Diagnosis not present

## 2016-04-26 DIAGNOSIS — I2510 Atherosclerotic heart disease of native coronary artery without angina pectoris: Secondary | ICD-10-CM | POA: Diagnosis not present

## 2016-04-26 DIAGNOSIS — G301 Alzheimer's disease with late onset: Secondary | ICD-10-CM | POA: Diagnosis not present

## 2016-04-26 DIAGNOSIS — Z7901 Long term (current) use of anticoagulants: Secondary | ICD-10-CM | POA: Diagnosis not present

## 2016-04-26 DIAGNOSIS — Z7951 Long term (current) use of inhaled steroids: Secondary | ICD-10-CM | POA: Diagnosis not present

## 2016-04-26 DIAGNOSIS — J690 Pneumonitis due to inhalation of food and vomit: Secondary | ICD-10-CM | POA: Diagnosis not present

## 2016-04-26 DIAGNOSIS — E782 Mixed hyperlipidemia: Secondary | ICD-10-CM | POA: Diagnosis not present

## 2016-04-26 DIAGNOSIS — M1990 Unspecified osteoarthritis, unspecified site: Secondary | ICD-10-CM | POA: Diagnosis not present

## 2016-04-26 DIAGNOSIS — E039 Hypothyroidism, unspecified: Secondary | ICD-10-CM | POA: Diagnosis not present

## 2016-04-26 DIAGNOSIS — F0280 Dementia in other diseases classified elsewhere without behavioral disturbance: Secondary | ICD-10-CM | POA: Diagnosis not present

## 2016-04-26 DIAGNOSIS — Z9981 Dependence on supplemental oxygen: Secondary | ICD-10-CM | POA: Diagnosis not present

## 2016-04-26 DIAGNOSIS — I10 Essential (primary) hypertension: Secondary | ICD-10-CM | POA: Diagnosis not present

## 2016-04-26 DIAGNOSIS — G4733 Obstructive sleep apnea (adult) (pediatric): Secondary | ICD-10-CM | POA: Diagnosis not present

## 2016-04-27 ENCOUNTER — Other Ambulatory Visit: Payer: Self-pay | Admitting: *Deleted

## 2016-04-27 NOTE — Patient Outreach (Signed)
[name]  04/27/2016  [name] March 09, 1934 [id number]   [name] is a 82 year old gentleman who was recently admitted to the hospital with aspiration pneumonia, acute encephalopathy likely secondary to unintentional medication overdose (Xanax which was discontinued, Vistaril prescribed for anxiety), and hypokalemia. Dustin [name] has dementia and is cared for at home by his wife. Dustin [name] lives in the immediate vicinity and also provides support.   I spoke with Dustin [name] by phone today and he says that his neuropathy and arthritis are causing him to have a lot of pain in his feet, legs, and hands. He says he has cut back on his medications as he was instructed to do when he was discharged from the hospital and he feels he is gaining strength and is more alert but is struggling with pain management.   I offered to visit Dustin [name] at home to assist with medication management and any other needs but he declined. He did say it would be okay to call him.   Plan: I will call Dustin [name] again next week and we'll discuss his progress. If He still doesn't want a visit, I will likely transition him to the Telephonic Case Management team.    [place] Care Management  [phone]

## 2016-04-28 ENCOUNTER — Other Ambulatory Visit: Payer: Self-pay | Admitting: Adult Health

## 2016-04-29 DIAGNOSIS — F419 Anxiety disorder, unspecified: Secondary | ICD-10-CM | POA: Diagnosis not present

## 2016-04-29 DIAGNOSIS — Z95818 Presence of other cardiac implants and grafts: Secondary | ICD-10-CM | POA: Diagnosis not present

## 2016-04-29 DIAGNOSIS — G301 Alzheimer's disease with late onset: Secondary | ICD-10-CM | POA: Diagnosis not present

## 2016-04-29 DIAGNOSIS — F0280 Dementia in other diseases classified elsewhere without behavioral disturbance: Secondary | ICD-10-CM | POA: Diagnosis not present

## 2016-04-29 DIAGNOSIS — Z9981 Dependence on supplemental oxygen: Secondary | ICD-10-CM | POA: Diagnosis not present

## 2016-04-29 DIAGNOSIS — Z7951 Long term (current) use of inhaled steroids: Secondary | ICD-10-CM | POA: Diagnosis not present

## 2016-04-29 DIAGNOSIS — G4733 Obstructive sleep apnea (adult) (pediatric): Secondary | ICD-10-CM | POA: Diagnosis not present

## 2016-04-29 DIAGNOSIS — J690 Pneumonitis due to inhalation of food and vomit: Secondary | ICD-10-CM | POA: Diagnosis not present

## 2016-04-29 DIAGNOSIS — M1990 Unspecified osteoarthritis, unspecified site: Secondary | ICD-10-CM | POA: Diagnosis not present

## 2016-04-29 DIAGNOSIS — Z7901 Long term (current) use of anticoagulants: Secondary | ICD-10-CM | POA: Diagnosis not present

## 2016-04-29 DIAGNOSIS — I10 Essential (primary) hypertension: Secondary | ICD-10-CM | POA: Diagnosis not present

## 2016-04-29 DIAGNOSIS — I2510 Atherosclerotic heart disease of native coronary artery without angina pectoris: Secondary | ICD-10-CM | POA: Diagnosis not present

## 2016-04-29 DIAGNOSIS — E039 Hypothyroidism, unspecified: Secondary | ICD-10-CM | POA: Diagnosis not present

## 2016-04-29 DIAGNOSIS — E782 Mixed hyperlipidemia: Secondary | ICD-10-CM | POA: Diagnosis not present

## 2016-05-07 DIAGNOSIS — J9620 Acute and chronic respiratory failure, unspecified whether with hypoxia or hypercapnia: Secondary | ICD-10-CM | POA: Diagnosis not present

## 2016-05-17 ENCOUNTER — Other Ambulatory Visit: Payer: Self-pay | Admitting: *Deleted

## 2016-05-17 NOTE — Patient Outreach (Signed)
[facility]) Care Management  05/17/2016  [name] August 27, 1934 [id number]  I reached out to Dustin [name] this afternoon. A gentleman answered the phone and said Dustin [name] was unavailable. I left a message stating I would return a call to Dustin [name] later this week.    [place] Management  [phone]

## 2016-05-20 ENCOUNTER — Other Ambulatory Visit: Payer: Self-pay | Admitting: Adult Health

## 2016-05-21 ENCOUNTER — Other Ambulatory Visit: Payer: Self-pay | Admitting: *Deleted

## 2016-05-21 NOTE — Patient Outreach (Signed)
[facility] [facility]) Care Management  05/21/2016  [name] 1934-08-30 [id number]  Dustin [name] is a 82 year old gentleman who was recently admitted to the hospital with aspiration pneumonia, acute encephalopathy likely secondary to unintentional medication overdose (Xanax which was discontinued, Vistaril prescribed for anxiety), and hypokalemia. Dustin [name] has dementia and is cared for at home by his wife. Dustin [name] brother lives in the immediate vicinity and also provides support.   I spoke with Dustin [name] by phone earlier and he told me that his neuropathy and arthritis were causing him to have a lot of pain in his feet, legs, and hands. He says he has cut back on his medications as he was instructed to do when he was discharged from the hospital and he feels he is gaining strength and is more alert but is struggling with pain management. He asked that I advise Dr. Gerarda Fraction of this ongoing pain management need.   I offered to visit Dustin [name] at home to assist with medication management and any other needs but he declined, saying he didn't really want any home visitors.   Plan: I will reach out to Verdis Frederickson, Patient Care Coordinator at [facility] to apprise her of Dustin [name]'s situation and desire to not have home visits. I will happily refer Dustin [name] to our telephonic health coaching team if Ms. Mel Almond feels he needs this extra support.    [place] Management  [phone]

## 2016-05-26 ENCOUNTER — Other Ambulatory Visit: Payer: Self-pay | Admitting: Adult Health

## 2016-06-04 ENCOUNTER — Other Ambulatory Visit: Payer: Self-pay | Admitting: Adult Health

## 2016-06-07 DIAGNOSIS — M1991 Primary osteoarthritis, unspecified site: Secondary | ICD-10-CM | POA: Diagnosis not present

## 2016-06-07 DIAGNOSIS — Z6827 Body mass index (BMI) 27.0-27.9, adult: Secondary | ICD-10-CM | POA: Diagnosis not present

## 2016-06-07 DIAGNOSIS — E063 Autoimmune thyroiditis: Secondary | ICD-10-CM | POA: Diagnosis not present

## 2016-06-07 DIAGNOSIS — G894 Chronic pain syndrome: Secondary | ICD-10-CM | POA: Diagnosis not present

## 2016-06-07 DIAGNOSIS — J9620 Acute and chronic respiratory failure, unspecified whether with hypoxia or hypercapnia: Secondary | ICD-10-CM | POA: Diagnosis not present

## 2016-06-08 DIAGNOSIS — Z86010 Personal history of colonic polyps: Secondary | ICD-10-CM | POA: Diagnosis not present

## 2016-06-09 NOTE — H&P (Signed)
  NTS SOAP Note  Vital Signs:  Vitals as of: [id number]: Systolic 0000000: Diastolic 61: Heart Rate 62: Temp 98.94F (Temporal): Height 85ft 8in: Weight 198Lbs 0 Ounces: BMI 30.11   BMI : 30.11 kg/m2  Subjective: This 82 year old male presents for of h/o colon polyps.  Last had a colonoscopy in 2013.  Found to have two tubular adenomas in the cecem and ascending colon.  Is here for follow up.  Denies any blood per rectum, family h/o colon cancer, weight loss, lower gi complaints.  Review of Symptoms:  Constitutional:fatigue Head:negative Eyes:blurred vision bilateral, pain bilateral Nose/Mouth/Throat:negative Cardiovascular:chest pain Respiratory:negative Gastrointestinnegative Genitourinary:dysuria, urinary hesitancy joint, neck, and back pain Skin:negative Hematolgic/Lymphatic:negative Allergic/Immunologic:negative   Past Medical History:Reviewed  Past Medical History  Surgical History: heart surgery Medical Problems: CAD, HTN, chronic pain, hypothyroidism, osteoarthritis Allergies: nkda Medications: plavix, duragesic, oxycodone, levothyroxine, xanax, memantine, HCTZ, amlodipine, tamsulosin, gabapentin, doneprezil   Social History:Reviewed  Social History  Preferred Language: English Race:  White Ethnicity: Not Hispanic / Latino Age: 51 year Marital Status:  M Alcohol: no   Smoking Status: Former smoker reviewed on 06/08/2016 Started Date:  Stopped Date: 06/09/1995 Functional Status reviewed on 06/08/2016 ------------------------------------------------ Bathing: Normal Cooking: Normal Dressing: Normal Driving: Normal Eating: Normal Managing Meds: Normal Oral Care: Normal Shopping: Normal Toileting: Normal Transferring: Normal Walking: Normal Cognitive Status reviewed on 06/08/2016 ------------------------------------------------ Attention: Normal Decision Making: Normal Language: Normal Memory: Normal Motor: Normal Perception:  Normal Problem Solving: Normal Visual and Spatial: Normal   Family History:Reviewed  Family Health History Mother, Deceased; Heart disease;  Father, Deceased; Healthy;     Objective Information: General:Well appearing, well nourished in no distress. Head:Atraumatic; no masses; no abnormalities Neck:Supple without lymphadenopathy.  Heart:RRR, no murmur or gallop.  Normal S1, S2.  No S3, S4.  Lungs:CTA bilaterally, no wheezes, rhonchi, rales.  Breathing unlabored. Abdomen:Soft, NT/ND, normal bowel sounds, no HSM, no masses.  No peritoneal signs. Back: GU: deferred to procdure  Assessment:personal h/o colon polyps  Diagnoses: V12.72  Z86.010 History of polyp of colon (Personal history of colonic polyps)  Procedures: [id number] - OFFICE OUTPATIENT NEW 30 MINUTES    Plan:  Scheduled for TCS on 06/22/16.  To hold plavix one week prior to procedure.  Trilyte prescribed.   Patient Education:Alternative treatments to surgery were discussed with patient (and family).Risks and benefits  of procedure including bleeding and perforation were fully explained to the patient (and family) who gave informed consent. Patient/family questions were addressed.  Follow-up:Pending Surgery

## 2016-06-10 ENCOUNTER — Other Ambulatory Visit: Payer: Self-pay | Admitting: *Deleted

## 2016-06-10 NOTE — Patient Outreach (Signed)
[place] [facility]) Care Management  06/10/2016  Dustin [name] 1934-01-16 [id number]   Dustin. Dustin [name] is a 82 year old gentleman who was recently admitted to the hospital with aspiration pneumonia, acute encephalopathy likely secondary to unintentional medication overdose (Xanax which was discontinued, Vistaril prescribed for anxiety), and hypokalemia. Dustin [name] has dementia and is cared for at home by his wife. Dustin [name] brother lives in the immediate vicinity and also provides support.   I spoke with Dustin [name] by phone earlier and he told me that his neuropathy and arthritis were causing him to have a lot of pain in his feet, legs, and hands. He says he has cut back on his medications as he was instructed to do when he was discharged from the hospital and he feels he is gaining strength and is more alert but is struggling with pain management. He has discussed this with Dustin [name] at a recent office appointment.   When I last spoke with Dustin [name], I offered to visit him at home to assist with medication management and any other needs but he declined, saying he didn't really want any home visitors. Dustin [name] has had home health nurses after his hospital discharge but they have completed visits.   Dustin [name] was likely at his pre-op appointment when I tried to reach him earlier. He is scheduled for TCS on 06/22/16. Dustin [name] is to hold his Plavix x 1 week prior to his procedure.   Plan: I have a call scheduled to Dustin [name] for 06/14/16 so that I can remind him to hold Plavix and follow up with other needs.    [place] Management  [phone]

## 2016-06-14 ENCOUNTER — Encounter: Payer: Self-pay | Admitting: *Deleted

## 2016-06-14 ENCOUNTER — Other Ambulatory Visit: Payer: Self-pay | Admitting: *Deleted

## 2016-06-14 NOTE — Patient Outreach (Signed)
[facility]) Care Management  06/14/2016  Dustin [name] 1934/10/13 [id number]  Dustin [name] does not wish to have [place] Management services. I spoke with him by phone and followed up with Verdis Frederickson, Patient Care Coordinator for [facility] who stated she had also spoken with Dustin [name] who confirmed that he does not wish to have care management services.   Plan: I will close his case. We are happy to assist with Dustin [name] care at any time in the future should he be in need of case management services and wish to engage.    [place] Management  [phone]

## 2016-06-21 ENCOUNTER — Other Ambulatory Visit: Payer: Self-pay | Admitting: Adult Health

## 2016-06-22 ENCOUNTER — Encounter (HOSPITAL_COMMUNITY)
Admission: RE | Disposition: A | Discharge status: Home / Self Care | Payer: Self-pay | Source: Ambulatory Visit | Attending: General Surgery

## 2016-06-22 ENCOUNTER — Encounter (HOSPITAL_COMMUNITY): Payer: Self-pay | Admitting: *Deleted

## 2016-06-22 ENCOUNTER — Ambulatory Visit (HOSPITAL_COMMUNITY)
Admission: RE | Admit: 2016-06-22 | Discharge: 2016-06-22 | Disposition: A | Discharge status: Home / Self Care | Payer: PPO | Source: Ambulatory Visit | Attending: General Surgery | Admitting: General Surgery

## 2016-06-22 DIAGNOSIS — Z87891 Personal history of nicotine dependence: Secondary | ICD-10-CM | POA: Diagnosis not present

## 2016-06-22 DIAGNOSIS — I10 Essential (primary) hypertension: Secondary | ICD-10-CM | POA: Insufficient documentation

## 2016-06-22 DIAGNOSIS — Z86010 Personal history of colonic polyps: Secondary | ICD-10-CM | POA: Insufficient documentation

## 2016-06-22 DIAGNOSIS — K5730 Diverticulosis of large intestine without perforation or abscess without bleeding: Secondary | ICD-10-CM | POA: Insufficient documentation

## 2016-06-22 DIAGNOSIS — Z1211 Encounter for screening for malignant neoplasm of colon: Secondary | ICD-10-CM | POA: Diagnosis not present

## 2016-06-22 DIAGNOSIS — E039 Hypothyroidism, unspecified: Secondary | ICD-10-CM | POA: Insufficient documentation

## 2016-06-22 DIAGNOSIS — G8929 Other chronic pain: Secondary | ICD-10-CM | POA: Insufficient documentation

## 2016-06-22 DIAGNOSIS — I2510 Atherosclerotic heart disease of native coronary artery without angina pectoris: Secondary | ICD-10-CM | POA: Insufficient documentation

## 2016-06-22 DIAGNOSIS — K641 Second degree hemorrhoids: Secondary | ICD-10-CM | POA: Diagnosis not present

## 2016-06-22 DIAGNOSIS — M1990 Unspecified osteoarthritis, unspecified site: Secondary | ICD-10-CM | POA: Diagnosis not present

## 2016-06-22 DIAGNOSIS — Z7902 Long term (current) use of antithrombotics/antiplatelets: Secondary | ICD-10-CM | POA: Insufficient documentation

## 2016-06-22 DIAGNOSIS — Z79899 Other long term (current) drug therapy: Secondary | ICD-10-CM | POA: Insufficient documentation

## 2016-06-22 HISTORY — PX: COLONOSCOPY: SHX5424

## 2016-06-22 SURGERY — COLONOSCOPY
Anesthesia: Moderate Sedation

## 2016-06-22 MED ORDER — STERILE WATER FOR IRRIGATION IR SOLN
Status: DC | PRN
  Administered 2016-06-22: 08:00:00

## 2016-06-22 MED ORDER — MIDAZOLAM HCL 5 MG/5ML IJ SOLN
INTRAMUSCULAR | Status: AC
  Filled 2016-06-22: qty 10

## 2016-06-22 MED ORDER — MIDAZOLAM HCL 5 MG/5ML IJ SOLN
INTRAMUSCULAR | Status: DC | PRN
  Administered 2016-06-22: 1 mg via INTRAVENOUS
  Administered 2016-06-22: 2 mg via INTRAVENOUS

## 2016-06-22 MED ORDER — MEPERIDINE HCL 100 MG/ML IJ SOLN
INTRAMUSCULAR | Status: AC
  Filled 2016-06-22: qty 1

## 2016-06-22 MED ORDER — MEPERIDINE HCL 50 MG/ML IJ SOLN
INTRAMUSCULAR | Status: DC | PRN
  Administered 2016-06-22: 50 mg via INTRAVENOUS

## 2016-06-22 MED ORDER — SODIUM CHLORIDE 0.9 % IV SOLN
INTRAVENOUS | Status: DC
  Administered 2016-06-22: 07:00:00 via INTRAVENOUS

## 2016-06-22 NOTE — Discharge Instructions (Signed)
Diverticulosis °Diverticulosis is the condition that develops when small pouches (diverticula) form in the wall of your colon. Your colon, or large intestine, is where water is absorbed and stool is formed. The pouches form when the inside layer of your colon pushes through weak spots in the outer layers of your colon. °CAUSES  °No one knows exactly what causes diverticulosis. °RISK FACTORS °· Being older than 50. Your risk for this condition increases with age. Diverticulosis is rare in people younger than 40 years. By age 80, almost everyone has it. °· Eating a low-fiber diet. °· Being frequently constipated. °· Being overweight. °· Not getting enough exercise. °· Smoking. °· Taking over-the-counter pain medicines, like aspirin and ibuprofen. °SYMPTOMS  °Most people with diverticulosis do not have symptoms. °DIAGNOSIS  °Because diverticulosis often has no symptoms, health care providers often discover the condition during an exam for other colon problems. In many cases, a health care provider will diagnose diverticulosis while using a flexible scope to examine the colon (colonoscopy). °TREATMENT  °If you have never developed an infection related to diverticulosis, you may not need treatment. If you have had an infection before, treatment may include: °· Eating more fruits, vegetables, and grains. °· Taking a fiber supplement. °· Taking a live bacteria supplement (probiotic). °· Taking medicine to relax your colon. °HOME CARE INSTRUCTIONS  °· Drink at least 6-8 glasses of water each day to prevent constipation. °· Try not to strain when you have a bowel movement. °· Keep all follow-up appointments. °If you have had an infection before:  °· Increase the fiber in your diet as directed by your health care provider or dietitian. °· Take a dietary fiber supplement if your health care provider approves. °· Only take medicines as directed by your health care provider. °SEEK MEDICAL CARE IF:  °· You have abdominal  pain. °· You have bloating. °· You have cramps. °· You have not gone to the bathroom in 3 days. °SEEK IMMEDIATE MEDICAL CARE IF:  °· Your pain gets worse. °· Your bloating becomes very bad. °· You have a fever or chills, and your symptoms suddenly get worse. °· You begin vomiting. °· You have bowel movements that are bloody or black. °MAKE SURE YOU: °· Understand these instructions. °· Will watch your condition. °· Will get help right away if you are not doing well or get worse. °  °This information is not intended to replace advice given to you by your health care provider. Make sure you discuss any questions you have with your health care provider. °  °Document Released: 09/09/2004 Document Revised: 12/18/2013 Document Reviewed: 11/07/2013 °Elsevier Interactive Patient Education ©2016 Elsevier Inc[employer] °Colonoscopy, Care After °Refer to this sheet in the next few weeks. These instructions provide you with information on caring for yourself after your procedure. Your health care provider may also give you more specific instructions. Your treatment has been planned according to current medical practices, but problems sometimes occur. Call your health care provider if you have any problems or questions after your procedure. °WHAT TO EXPECT AFTER THE PROCEDURE  °After your procedure, it is typical to have the following: °· A small amount of blood in your stool. °· Moderate amounts of gas and mild abdominal cramping or bloating. °HOME CARE INSTRUCTIONS °· Do not drive, operate machinery, or sign important documents for 24 hours. °· You may shower and resume your regular physical activities, but move at a slower pace for the first 24 hours. °· Take frequent rest periods for the   first 24 hours.  Walk around or put a warm pack on your abdomen to help reduce abdominal cramping and bloating.  Drink enough fluids to keep your urine clear or pale yellow.  You may resume your normal diet as instructed by your health care  provider. Avoid heavy or fried foods that are hard to digest.  Avoid drinking alcohol for 24 hours or as instructed by your health care provider.  Only take over-the-counter or prescription medicines as directed by your health care provider.  If a tissue sample (biopsy) was taken during your procedure:  Do not take aspirin or blood thinners for 7 days, or as instructed by your health care provider.  Do not drink alcohol for 7 days, or as instructed by your health care provider.  Eat soft foods for the first 24 hours. SEEK MEDICAL CARE IF: You have persistent spotting of blood in your stool 2-3 days after the procedure. SEEK IMMEDIATE MEDICAL CARE IF:  You have more than a small spotting of blood in your stool.  You pass large blood clots in your stool.  Your abdomen is swollen (distended).  You have nausea or vomiting.  You have a fever.  You have increasing abdominal pain that is not relieved with medicine.   This information is not intended to replace advice given to you by your health care provider. Make sure you discuss any questions you have with your health care provider.   Document Released: 07/27/2004 Document Revised: 10/03/2013 Document Reviewed: 08/20/2013 Elsevier Interactive Patient Education 2016 Elsevier Inc. High-Fiber Diet Fiber, also called dietary fiber, is a type of carbohydrate found in fruits, vegetables, whole grains, and beans. A high-fiber diet can have many health benefits. Your health care provider may recommend a high-fiber diet to help: Prevent constipation. Fiber can make your bowel movements more regular. Lower your cholesterol. Relieve hemorrhoids, uncomplicated diverticulosis, or irritable bowel syndrome. Prevent overeating as part of a weight-loss plan. Prevent heart disease, type 2 diabetes, and certain cancers. WHAT IS MY PLAN? The recommended daily intake of fiber includes: 38 grams for men under age 72. 58 grams for men over age  31. 35 grams for women under age 60. 57 grams for women over age 8. You can get the recommended daily intake of dietary fiber by eating a variety of fruits, vegetables, grains, and beans. Your health care provider may also recommend a fiber supplement if it is not possible to get enough fiber through your diet. WHAT DO I NEED TO KNOW ABOUT A HIGH-FIBER DIET? Fiber supplements have not been widely studied for their effectiveness, so it is better to get fiber through food sources. Always check the fiber content on thenutrition facts label of any prepackaged food. Look for foods that contain at least 5 grams of fiber per serving. Ask your dietitian if you have questions about specific foods that are related to your condition, especially if those foods are not listed in the following section. Increase your daily fiber consumption gradually. Increasing your intake of dietary fiber too quickly may cause bloating, cramping, or gas. Drink plenty of water. Water helps you to digest fiber. WHAT FOODS CAN I EAT? Grains Whole-grain breads. Multigrain cereal. Oats and oatmeal. Brown rice. Barley. Bulgur wheat. Romulus. Bran muffins. Popcorn. Rye wafer crackers. Vegetables Sweet potatoes. Spinach. Kale. Artichokes. Cabbage. Broccoli. Green peas. Carrots. Squash. Fruits Berries. Pears. Apples. Oranges. Avocados. Prunes and raisins. Dried figs. Meats and Other Protein Sources Navy, kidney, pinto, and soy beans. Split peas. Lentils. Nuts  and seeds. Dairy Fiber-fortified yogurt. Beverages Fiber-fortified soy milk. Fiber-fortified orange juice. Other Fiber bars. The items listed above may not be a complete list of recommended foods or beverages. Contact your dietitian for more options. WHAT FOODS ARE NOT RECOMMENDED? Grains White bread. Pasta made with refined flour. White rice. Vegetables Fried potatoes. Canned vegetables. Well-cooked vegetables.  Fruits Fruit juice. Cooked, strained fruit. Meats and  Other Protein Sources Fatty cuts of meat. Fried Sales executive or fried fish. Dairy Milk. Yogurt. Cream cheese. Sour cream. Beverages Soft drinks. Other Cakes and pastries. Butter and oils. The items listed above may not be a complete list of foods and beverages to avoid. Contact your dietitian for more information. WHAT ARE SOME TIPS FOR INCLUDING HIGH-FIBER FOODS IN MY DIET? Eat a wide variety of high-fiber foods. Make sure that half of all grains consumed each day are whole grains. Replace breads and cereals made from refined flour or white flour with whole-grain breads and cereals. Replace white rice with brown rice, bulgur wheat, or millet. Start the day with a breakfast that is high in fiber, such as a cereal that contains at least 5 grams of fiber per serving. Use beans in place of meat in soups, salads, or pasta. Eat high-fiber snacks, such as berries, raw vegetables, nuts, or popcorn.   This information is not intended to replace advice given to you by your health care provider. Make sure you discuss any questions you have with your health care provider.   Document Released: 12/13/2005 Document Revised: 01/03/2015 Document Reviewed: 05/28/2014 Elsevier Interactive Patient Education [employer].

## 2016-06-22 NOTE — Op Note (Signed)
[facility] Patient Name: Dustin [name] Procedure Date: 06/22/2016 7:31 AM MRN: [id number] Date of Birth: [birth year] Attending MD: Aviva Signs , MD CSN: [id number] Age: 82 Admit Type: Outpatient Procedure:                Colonoscopy Indications:              High risk colon cancer surveillance: Personal                            history of non-advanced adenoma Providers:                Aviva Signs, MD, Hinton Rao, RN, Purcell Nails.                            Lemons, Technician Referring MD:              Medicines:                Midazolam 3 mg IV, Meperidine 50 mg IV Complications:            No immediate complications. Estimated Blood Loss:     Estimated blood loss: none. Procedure:                Pre-Anesthesia Assessment:                           - Prior to the procedure, a History and Physical                            was performed, and patient medications and                            allergies were reviewed. The patient is competent.                            The risks and benefits of the procedure and the                            sedation options and risks were discussed with the                            patient. All questions were answered and informed                            consent was obtained. Patient identification and                            proposed procedure were verified by the nurse in                            the pre-procedure area in the endoscopy suite.                            Mental Status Examination: alert and oriented.  Airway Examination: normal oropharyngeal airway and                            neck mobility. Respiratory Examination: clear to                            auscultation. CV Examination: regular rate and                            rhythm. Prophylactic Antibiotics: The patient does                            not require prophylactic antibiotics. Prior                            Anticoagulants:  The patient has taken Plavix                            (clopidogrel), last dose was 7 days prior to                            procedure. ASA Grade Assessment: III - A patient                            with severe systemic disease. After reviewing the                            risks and benefits, the patient was deemed in                            satisfactory condition to undergo the procedure.                            The anesthesia plan was to use moderate sedation /                            analgesia (conscious sedation). Immediately prior                            to administration of medications, the patient was                            re-assessed for adequacy to receive sedatives. The                            heart rate, respiratory rate, oxygen saturations,                            blood pressure, adequacy of pulmonary ventilation,                            and response to care were monitored throughout the  procedure. The physical status of the patient was                            re-assessed after the procedure.                           After obtaining informed consent, the colonoscope                            was passed under direct vision. Throughout the                            procedure, the patient's blood pressure, pulse, and                            oxygen saturations were monitored continuously. The                            [id number]-[id number]) scope was introduced through                            the anus and advanced to the the cecum, identified                            by the appendiceal orifice, ileocecal valve and                            palpation. The patient tolerated the procedure                            well. The quality of the bowel preparation was                            adequate. The total duration of the procedure was                            13 minutes. No anatomical landmarks were                             photographed. Scope In: 7:38:21 AM Scope Out: 7:50:15 AM Scope Withdrawal Time: 0 hours 4 minutes 52 seconds  Total Procedure Duration: 0 hours 11 minutes 54 seconds  Findings:      The perianal exam findings include internal hemorrhoids that prolapse       with straining, but spontaneously regress to the resting position (Grade       II).      Multiple medium-mouthed diverticula were found in the sigmoid colon.       There was no evidence of diverticular bleeding.      The exam was otherwise without abnormality on direct and retroflexion       views. Impression:               - Internal hemorrhoids that prolapse with  straining, but spontaneously regress to the resting                            position (Grade II) found on perianal exam.                           - Diverticulosis in the sigmoid colon. There was no                            evidence of diverticular bleeding.                           - The examination was otherwise normal on direct                            and retroflexion views.                           - No specimens collected. Moderate Sedation:      Moderate (conscious) sedation was administered by the endoscopy nurse       and supervised by the endoscopist. The following parameters were       monitored: oxygen saturation, heart rate, blood pressure, and response       to care. Recommendation:           - Written discharge instructions were provided to                            the patient.                           - Resume previous diet.                           - Post-Procedure Resumption of Antiplatelet                            Medications: Restart Plavix (clopidogrel) tomorrow                            [Dose] [Route] [Frequency]. [Management].                           - Patient has a contact number available for                            emergencies. The signs and symptoms of potential                             delayed complications were discussed with the                            patient. Return to normal activities tomorrow.                            Written discharge instructions were provided to the  patient.                           - Return to normal activities tomorrow.                           - No repeat colonoscopy [Reason]. Procedure Code(s):        --- Professional ---                           [phone], Colonoscopy, flexible; diagnostic, including                            collection of specimen(s) by brushing or washing,                            when performed (separate procedure) Diagnosis Code(s):        --- Professional ---                           Z86.010, Personal history of colonic polyps                           K64.1, Second degree hemorrhoids                           K57.30, Diverticulosis of large intestine without                            perforation or abscess without bleeding CPT copyright 2016 American Medical Association. All rights reserved. The codes documented in this report are preliminary and upon coder review may  be revised to meet current compliance requirements. Aviva Signs, MD Aviva Signs, MD 06/22/2016 7:59:03 AM This report has been signed electronically. Number of Addenda: 0

## 2016-06-22 NOTE — Interval H&P Note (Signed)
History and Physical Interval Note:  06/22/2016 7:31 AM  Dustin [name]  has presented today for surgery, with the diagnosis of h/o colon polyps  The various methods of treatment have been discussed with the patient and family. After consideration of risks, benefits and other options for treatment, the patient has consented to  Procedure(s) with comments: COLONOSCOPY (N/A) - 730 as a surgical intervention .  The patient's history has been reviewed, patient examined, no change in status, stable for surgery.  I have reviewed the patient's chart and labs.  Questions were answered to the patient's satisfaction.     Aviva Signs A

## 2016-06-23 ENCOUNTER — Other Ambulatory Visit: Payer: Self-pay | Admitting: *Deleted

## 2016-06-23 NOTE — Patient Outreach (Signed)
[facility]) Care Management  06/23/2016  Dustin [name] [birth year] [id number]   Dustin [name] does not wish to have case management services at this time. He is being followed closely by Verdis Frederickson CMA @ [facility].   Plan: Case Closure   [facility] Care Management  [phone]

## 2016-06-24 ENCOUNTER — Encounter (HOSPITAL_COMMUNITY): Payer: Self-pay | Admitting: General Surgery

## 2016-07-05 ENCOUNTER — Other Ambulatory Visit: Payer: Self-pay | Admitting: Adult Health

## 2016-07-07 DIAGNOSIS — J9620 Acute and chronic respiratory failure, unspecified whether with hypoxia or hypercapnia: Secondary | ICD-10-CM | POA: Diagnosis not present

## 2016-07-14 DIAGNOSIS — M4806 Spinal stenosis, lumbar region: Secondary | ICD-10-CM | POA: Diagnosis not present

## 2016-07-14 DIAGNOSIS — E781 Pure hyperglyceridemia: Secondary | ICD-10-CM | POA: Diagnosis not present

## 2016-07-14 DIAGNOSIS — Z6827 Body mass index (BMI) 27.0-27.9, adult: Secondary | ICD-10-CM | POA: Diagnosis not present

## 2016-07-14 DIAGNOSIS — G64 Other disorders of peripheral nervous system: Secondary | ICD-10-CM | POA: Diagnosis not present

## 2016-07-14 DIAGNOSIS — E039 Hypothyroidism, unspecified: Secondary | ICD-10-CM | POA: Diagnosis not present

## 2016-07-14 DIAGNOSIS — G894 Chronic pain syndrome: Secondary | ICD-10-CM | POA: Diagnosis not present

## 2016-07-14 DIAGNOSIS — E748 Other specified disorders of carbohydrate metabolism: Secondary | ICD-10-CM | POA: Diagnosis not present

## 2016-08-04 ENCOUNTER — Other Ambulatory Visit: Payer: Self-pay | Admitting: Adult Health

## 2016-08-07 DIAGNOSIS — J9620 Acute and chronic respiratory failure, unspecified whether with hypoxia or hypercapnia: Secondary | ICD-10-CM | POA: Diagnosis not present

## 2016-08-23 DIAGNOSIS — F1120 Opioid dependence, uncomplicated: Secondary | ICD-10-CM | POA: Diagnosis not present

## 2016-08-23 DIAGNOSIS — R0600 Dyspnea, unspecified: Secondary | ICD-10-CM | POA: Diagnosis not present

## 2016-08-23 DIAGNOSIS — G894 Chronic pain syndrome: Secondary | ICD-10-CM | POA: Diagnosis not present

## 2016-08-23 DIAGNOSIS — N401 Enlarged prostate with lower urinary tract symptoms: Secondary | ICD-10-CM | POA: Diagnosis not present

## 2016-08-24 ENCOUNTER — Ambulatory Visit (HOSPITAL_COMMUNITY)
Admission: RE | Admit: 2016-08-24 | Discharge: 2016-08-24 | Disposition: A | Discharge status: Home / Self Care | Payer: PPO | Source: Ambulatory Visit | Attending: Internal Medicine | Admitting: Internal Medicine

## 2016-08-24 ENCOUNTER — Other Ambulatory Visit (HOSPITAL_COMMUNITY): Payer: Self-pay | Admitting: Internal Medicine

## 2016-08-24 DIAGNOSIS — Z951 Presence of aortocoronary bypass graft: Secondary | ICD-10-CM | POA: Insufficient documentation

## 2016-08-24 DIAGNOSIS — R0600 Dyspnea, unspecified: Secondary | ICD-10-CM | POA: Insufficient documentation

## 2016-08-24 DIAGNOSIS — I700 Atherosclerosis of aorta: Secondary | ICD-10-CM | POA: Insufficient documentation

## 2016-08-24 DIAGNOSIS — R918 Other nonspecific abnormal finding of lung field: Secondary | ICD-10-CM | POA: Diagnosis not present

## 2016-08-26 ENCOUNTER — Other Ambulatory Visit: Payer: Self-pay | Admitting: Adult Health

## 2016-08-30 ENCOUNTER — Other Ambulatory Visit: Payer: Self-pay | Admitting: Adult Health

## 2016-09-01 ENCOUNTER — Other Ambulatory Visit: Payer: Self-pay | Admitting: Adult Health

## 2016-09-07 DIAGNOSIS — J9620 Acute and chronic respiratory failure, unspecified whether with hypoxia or hypercapnia: Secondary | ICD-10-CM | POA: Diagnosis not present

## 2016-10-04 ENCOUNTER — Other Ambulatory Visit: Payer: Self-pay | Admitting: Adult Health

## 2016-10-07 DIAGNOSIS — J9620 Acute and chronic respiratory failure, unspecified whether with hypoxia or hypercapnia: Secondary | ICD-10-CM | POA: Diagnosis not present

## 2016-10-14 DIAGNOSIS — Z6827 Body mass index (BMI) 27.0-27.9, adult: Secondary | ICD-10-CM | POA: Diagnosis not present

## 2016-10-14 DIAGNOSIS — F329 Major depressive disorder, single episode, unspecified: Secondary | ICD-10-CM | POA: Diagnosis not present

## 2016-10-14 DIAGNOSIS — E063 Autoimmune thyroiditis: Secondary | ICD-10-CM | POA: Diagnosis not present

## 2016-10-14 DIAGNOSIS — Z23 Encounter for immunization: Secondary | ICD-10-CM | POA: Diagnosis not present

## 2016-10-14 DIAGNOSIS — M1991 Primary osteoarthritis, unspecified site: Secondary | ICD-10-CM | POA: Diagnosis not present

## 2016-10-14 DIAGNOSIS — G894 Chronic pain syndrome: Secondary | ICD-10-CM | POA: Diagnosis not present

## 2016-10-14 DIAGNOSIS — Z1389 Encounter for screening for other disorder: Secondary | ICD-10-CM | POA: Diagnosis not present

## 2016-10-15 ENCOUNTER — Other Ambulatory Visit (HOSPITAL_COMMUNITY)
Admission: RE | Admit: 2016-10-15 | Discharge: 2016-10-15 | Disposition: A | Discharge status: Home / Self Care | Payer: PPO | Source: Other Acute Inpatient Hospital | Attending: Urology | Admitting: Urology

## 2016-10-15 ENCOUNTER — Ambulatory Visit (INDEPENDENT_AMBULATORY_CARE_PROVIDER_SITE_OTHER): Payer: PPO | Admitting: Urology

## 2016-10-15 DIAGNOSIS — N401 Enlarged prostate with lower urinary tract symptoms: Secondary | ICD-10-CM

## 2016-10-15 DIAGNOSIS — R350 Frequency of micturition: Secondary | ICD-10-CM

## 2016-10-15 DIAGNOSIS — R300 Dysuria: Secondary | ICD-10-CM | POA: Diagnosis not present

## 2016-10-15 LAB — URINE MICROSCOPIC-ADD ON

## 2016-10-15 LAB — URINALYSIS, ROUTINE W REFLEX MICROSCOPIC
BILIRUBIN URINE: NEGATIVE
Glucose, UA: NEGATIVE mg/dL
NITRITE: NEGATIVE
Protein, ur: 30 mg/dL — AB
Specific Gravity, Urine: 1.025 (ref 1.005–1.030)
pH: 6 (ref 5.0–8.0)

## 2016-10-16 LAB — URINE CULTURE: CULTURE: NO GROWTH

## 2016-11-07 DIAGNOSIS — J9620 Acute and chronic respiratory failure, unspecified whether with hypoxia or hypercapnia: Secondary | ICD-10-CM | POA: Diagnosis not present

## 2016-12-03 ENCOUNTER — Ambulatory Visit: Payer: PPO | Admitting: Urology

## 2016-12-07 DIAGNOSIS — J9620 Acute and chronic respiratory failure, unspecified whether with hypoxia or hypercapnia: Secondary | ICD-10-CM | POA: Diagnosis not present

## 2016-12-10 DIAGNOSIS — F1120 Opioid dependence, uncomplicated: Secondary | ICD-10-CM | POA: Diagnosis not present

## 2016-12-10 DIAGNOSIS — F419 Anxiety disorder, unspecified: Secondary | ICD-10-CM | POA: Diagnosis not present

## 2016-12-10 DIAGNOSIS — Z6829 Body mass index (BMI) 29.0-29.9, adult: Secondary | ICD-10-CM | POA: Diagnosis not present

## 2016-12-10 DIAGNOSIS — G894 Chronic pain syndrome: Secondary | ICD-10-CM | POA: Diagnosis not present

## 2016-12-10 DIAGNOSIS — J329 Chronic sinusitis, unspecified: Secondary | ICD-10-CM | POA: Diagnosis not present

## 2016-12-10 DIAGNOSIS — R600 Localized edema: Secondary | ICD-10-CM | POA: Diagnosis not present

## 2016-12-10 DIAGNOSIS — E663 Overweight: Secondary | ICD-10-CM | POA: Diagnosis not present

## 2016-12-10 DIAGNOSIS — M1991 Primary osteoarthritis, unspecified site: Secondary | ICD-10-CM | POA: Diagnosis not present

## 2016-12-10 DIAGNOSIS — Z1389 Encounter for screening for other disorder: Secondary | ICD-10-CM | POA: Diagnosis not present

## 2016-12-23 ENCOUNTER — Observation Stay (HOSPITAL_COMMUNITY)
Admission: EM | Admit: 2016-12-23 | Discharge: 2016-12-25 | Disposition: A | Discharge status: Home / Self Care | Payer: PPO | Attending: Internal Medicine | Admitting: Internal Medicine

## 2016-12-23 ENCOUNTER — Telehealth: Payer: Self-pay | Admitting: Cardiology

## 2016-12-23 ENCOUNTER — Encounter (HOSPITAL_COMMUNITY): Payer: Self-pay | Admitting: *Deleted

## 2016-12-23 ENCOUNTER — Emergency Department (HOSPITAL_COMMUNITY): Payer: PPO

## 2016-12-23 DIAGNOSIS — E039 Hypothyroidism, unspecified: Secondary | ICD-10-CM | POA: Diagnosis not present

## 2016-12-23 DIAGNOSIS — I10 Essential (primary) hypertension: Secondary | ICD-10-CM | POA: Diagnosis present

## 2016-12-23 DIAGNOSIS — M25562 Pain in left knee: Secondary | ICD-10-CM

## 2016-12-23 DIAGNOSIS — G8929 Other chronic pain: Secondary | ICD-10-CM | POA: Diagnosis present

## 2016-12-23 DIAGNOSIS — Z08 Encounter for follow-up examination after completed treatment for malignant neoplasm: Secondary | ICD-10-CM | POA: Diagnosis not present

## 2016-12-23 DIAGNOSIS — M79672 Pain in left foot: Secondary | ICD-10-CM

## 2016-12-23 DIAGNOSIS — I2510 Atherosclerotic heart disease of native coronary artery without angina pectoris: Secondary | ICD-10-CM | POA: Diagnosis present

## 2016-12-23 DIAGNOSIS — M79671 Pain in right foot: Secondary | ICD-10-CM | POA: Diagnosis present

## 2016-12-23 DIAGNOSIS — R0789 Other chest pain: Principal | ICD-10-CM | POA: Insufficient documentation

## 2016-12-23 DIAGNOSIS — R079 Chest pain, unspecified: Secondary | ICD-10-CM | POA: Diagnosis not present

## 2016-12-23 DIAGNOSIS — M25561 Pain in right knee: Secondary | ICD-10-CM

## 2016-12-23 DIAGNOSIS — Z87891 Personal history of nicotine dependence: Secondary | ICD-10-CM | POA: Insufficient documentation

## 2016-12-23 DIAGNOSIS — I5032 Chronic diastolic (congestive) heart failure: Secondary | ICD-10-CM | POA: Diagnosis present

## 2016-12-23 DIAGNOSIS — R110 Nausea: Secondary | ICD-10-CM | POA: Diagnosis not present

## 2016-12-23 DIAGNOSIS — R0602 Shortness of breath: Secondary | ICD-10-CM | POA: Insufficient documentation

## 2016-12-23 DIAGNOSIS — G4733 Obstructive sleep apnea (adult) (pediatric): Secondary | ICD-10-CM | POA: Diagnosis present

## 2016-12-23 DIAGNOSIS — Z85828 Personal history of other malignant neoplasm of skin: Secondary | ICD-10-CM | POA: Diagnosis not present

## 2016-12-23 DIAGNOSIS — R2689 Other abnormalities of gait and mobility: Secondary | ICD-10-CM

## 2016-12-23 DIAGNOSIS — R001 Bradycardia, unspecified: Secondary | ICD-10-CM | POA: Diagnosis present

## 2016-12-23 DIAGNOSIS — L820 Inflamed seborrheic keratosis: Secondary | ICD-10-CM | POA: Diagnosis not present

## 2016-12-23 DIAGNOSIS — F0390 Unspecified dementia without behavioral disturbance: Secondary | ICD-10-CM | POA: Diagnosis present

## 2016-12-23 DIAGNOSIS — D225 Melanocytic nevi of trunk: Secondary | ICD-10-CM | POA: Diagnosis not present

## 2016-12-23 DIAGNOSIS — E876 Hypokalemia: Secondary | ICD-10-CM | POA: Diagnosis present

## 2016-12-23 LAB — CBC
HEMATOCRIT: 39.2 % (ref 39.0–52.0)
HEMOGLOBIN: 12.9 g/dL — AB (ref 13.0–17.0)
MCH: 30.9 pg (ref 26.0–34.0)
MCHC: 32.9 g/dL (ref 30.0–36.0)
MCV: 94 fL (ref 78.0–100.0)
Platelets: 216 10*3/uL (ref 150–400)
RBC: 4.17 MIL/uL — AB (ref 4.22–5.81)
RDW: 14 % (ref 11.5–15.5)
WBC: 8.1 10*3/uL (ref 4.0–10.5)

## 2016-12-23 LAB — BASIC METABOLIC PANEL
ANION GAP: 6 (ref 5–15)
BUN: 12 mg/dL (ref 6–20)
CHLORIDE: 97 mmol/L — AB (ref 101–111)
CO2: 36 mmol/L — ABNORMAL HIGH (ref 22–32)
Calcium: 9.2 mg/dL (ref 8.9–10.3)
Creatinine, Ser: 1.18 mg/dL (ref 0.61–1.24)
GFR, EST NON AFRICAN AMERICAN: 56 mL/min — AB (ref >60)
Glucose, Bld: 104 mg/dL — ABNORMAL HIGH (ref 65–99)
POTASSIUM: 3.1 mmol/L — AB (ref 3.5–5.1)
SODIUM: 139 mmol/L (ref 135–145)

## 2016-12-23 LAB — TROPONIN I: Troponin I: <0.03 ng/mL (ref <0.03)

## 2016-12-23 NOTE — ED Provider Notes (Signed)
[facility] DEPT Provider Note   CSN: [id number] Arrival date & time: 12/23/16  1753  By signing my name below, I, Higinio Plan, attest that this documentation has been prepared under the direction and in the presence of Courtney Paris, MD . Electronically Signed: Higinio Plan, Scribe. 12/23/2016. 9:45 PM.  History   Chief Complaint Chief Complaint  Patient presents with  . Foot Pain  . Chest Pain   The history is provided by the patient and the spouse. No language interpreter was used.   HPI Comments: NEMIAH SCALLION is a 82 y.o. male with PMHx of arthritis, MI and Alzheimer's Disease, who presents to the Emergency Department complaining of gradually worsening, intermittent, bilateral foot pain that began 2-3 years ago. Pt reports associated "skin darkening" to his feet, bilateral leg swelling and difficulty ambulating due to pain. He also notes he fell twice last week due to pain. He reports he recently visited his neurologist who told him his "nerves were twitching."    Pt also complains of gradually worsening, intermittent, "shooting," chest pain that began ~1 week ago. He reports his pain radiated into his left shoulder with associated nausea and shortness of breath. He states he took 2 Nitro pills from home this evening with moderate relief. He notes his pain has significantly improved now in the ED. He states he has taken "20 Nitro pills this week" and oxycodone with mild relief. He also reports associated difficulty sleeping in which he "goes to bed at ~5:30 AM in the morning" and constipation. He notes PSHx of CABG and cardiac catheterization in which he had 1 stent placed in 1996. He denies of lightheadedness.   Past Medical History:  Diagnosis Date  . Alzheimer disease   . Anxiety   . Arthritis   . Atrophic gastritis    a. By EGD 02/2013.  [name] Coronary atherosclerosis of native coronary artery    a. Multivessel s/p CABG 1996. b. s/p DES x 2 SVG to PDA 8/12 with distal  disease managed medically.  . DDD (degenerative disc disease)    Chronic back pain  . Enlarged prostate   . Essential hypertension, benign   . Hematuria   . Hypothyroidism   . MI (myocardial infarction)   . Mixed hyperlipidemia   . OA (osteoarthritis)   . OSA (obstructive sleep apnea)     Patient Active Problem List   Diagnosis Date Noted  . Hypokalemia 04/05/2016  . Acute respiratory failure ([place]) 04/03/2016  . Acute encephalopathy 04/03/2016  . Hypothyroidism 04/03/2016  . Aspiration pneumonia ([place]) 04/03/2016  . Dementia 04/03/2016  . Acute respiratory failure with hypoxia ([place]) 04/03/2016  . Memory loss 04/20/2015  . Hypoxia 01/27/2015  . CAP (community acquired pneumonia) 01/27/2015  . Fever 08/30/2014  . Healthcare associated bacterial pneumonia 08/30/2014  . Toxic Metabolic encephalopathy [id number]  . Sepsis ([place]) 08/30/2014  . Neck pain on right side 08/29/2014  . Neck pain 08/29/2014  . Left-sided weakness 06/28/2014  . Chest pain 06/27/2014  . Tubular adenoma of colon 03/05/2013  . Anorexia 03/05/2013  . Loss of weight 03/05/2013  . Chronic constipation 07/04/2012  . Bronchitis 08/16/2011  . Hyperlipidemia 10/01/2009  . OBSTRUCTIVE SLEEP APNEA 10/01/2009  . [place] DISEASE 10/01/2009  . HYPERTENSION, BENIGN 10/01/2009  . Coronary atherosclerosis of native coronary artery 10/01/2009    Past Surgical History:  Procedure Laterality Date  . COLONOSCOPY  08/03/2004   Jenkins-numerous large diverticula in the descending, transverse, descending, and sigmoid colon. Otherwise normal exam.  .  COLONOSCOPY  07/12/2012   RMR: External hemorrhoidal tag; multiple rectal and colonic polyps removed and/or treated as described above. Pancolonic diverticulosis. Bx-tubular adenomas, rectal hyperplastic polyp. next colonoscopy in 06/2015.  [name] COLONOSCOPY N/A 06/22/2016   Procedure: COLONOSCOPY;  Surgeon: Aviva Signs, MD;  Location: [facility] SU[facility];  Service: Gastroenterology;   Laterality: N/A;  730  . CORONARY ANGIOPLASTY WITH STENT PLACEMENT    . CORONARY ARTERY BYPASS GRAFT  1996   LIMA to LAD, SVG to D2, SVG to PDA, SVG to OM1 and OM2  . ESOPHAGOGASTRODUODENOSCOPY N/A 03/16/2013   Procedure: ESOPHAGOGASTRODUODENOSCOPY (EGD);  Surgeon: Daneil Dolin, MD;  Location: [facility] SU[facility];  Service: Endoscopy;  Laterality: N/A;  12:00-moved to [place] notified pt  . ESOPHAGOGASTRODUODENOSCOPY N/A 03/06/2013   Procedure: ESOPHAGOGASTRODUODENOSCOPY (EGD);  Surgeon: Daneil Dolin, MD;  Location: [facility];  Service: Endoscopy;  Laterality: N/A;  . HERNIA REPAIR      Home Medications    Prior to Admission medications   Medication Sig Start Date End Date Taking? Authorizing Provider  albuterol (PROVENTIL HFA;VENTOLIN HFA) 108 (90 Base) MCG/ACT inhaler Inhale 2 puffs into the lungs every 6 (six) hours as needed for wheezing or shortness of breath. 04/06/16   Kathie Dike, MD  amLODipine (NORVASC) 10 MG tablet Take 1 tablet by mouth daily. 03/05/16   Historical Provider, MD  amoxicillin-clavulanate (AUGMENTIN) 875-125 MG tablet Take 1 tablet by mouth 2 (two) times daily. 04/06/16   Kathie Dike, MD  carvedilol (COREG) 3.125 MG tablet Take 1 tablet by mouth 2 (two) times daily. 08/06/14   Historical Provider, MD  clopidogrel (PLAVIX) 75 MG tablet Take 1 tablet (75 mg total) by mouth daily. 01/29/15   Hosie Poisson, MD  donepezil (ARICEPT) 10 MG tablet Take 1 tablet by mouth At bedtime. 07/01/12   Historical Provider, MD  escitalopram (LEXAPRO) 10 MG tablet Take 10 mg by mouth daily.    Historical Provider, MD  gabapentin (NEURONTIN) 300 MG capsule Take 1 capsule by mouth 3 (three) times daily. 01/08/16   Historical Provider, MD  hydrochlorothiazide (MICROZIDE) 12.5 MG capsule Take 1 capsule by mouth daily. 03/05/16   Historical Provider, MD  hydrOXYzine (VISTARIL) 25 MG capsule Take 4 capsules (100 mg total) by mouth 3 (three) times daily as needed for anxiety. 04/06/16    Kathie Dike, MD  levothyroxine (SYNTHROID, LEVOTHROID) 25 MCG tablet Take 25 mcg by mouth daily before breakfast.    Historical Provider, MD  memantine (NAMENDA) 10 MG tablet Take 1 tablet by mouth 2 (two) times daily. 03/05/16   Historical Provider, MD  nitroGLYCERIN (NITROSTAT) 0.4 MG SL tablet PLACE ONE TABLET UNDER TONGUE EVERY 5 MIN UP TO 3 DOSES AS NEEDED FORCHEST PAIN. 08/31/16   Lendon Colonel, NP  oxyCODONE (ROXICODONE) 15 MG immediate release tablet Take 0.5 tablets (7.5 mg total) by mouth every 4 (four) hours as needed for pain Bennie Pierini of 8 tablets daily as needed every 4 to 6 hours for pain*). 04/06/16   Kathie Dike, MD  pravastatin (PRAVACHOL) 40 MG tablet TAKE (1) TABLET BY MOUTH AT BEDTIME. 06/21/16   Lendon Colonel, NP  tamsulosin (FLOMAX) 0.4 MG CAPS capsule Take 1 capsule by mouth daily. 02/23/16   Historical Provider, MD    Family History Family History  Problem Relation Age of Onset  . Heart disease    . Colon cancer Neg Hx     Social History Social History  Substance Use Topics  . Smoking status: Former  Smoker    Packs/day: 2.00    Years: 40.00    Types: Cigarettes    Quit date: 12/27/1994  . Smokeless tobacco: Never Used  . Alcohol use No     Allergies   Patient has no known allergies.   Review of Systems Review of Systems  Respiratory: Positive for shortness of breath.   Cardiovascular: Positive for chest pain and leg swelling.  Gastrointestinal: Positive for constipation and nausea.  Musculoskeletal: Positive for arthralgias.  Neurological: Negative for light-headedness.  Psychiatric/Behavioral: Positive for sleep disturbance.  All other systems reviewed and are negative.  Physical Exam Updated Vital Signs BP 148/57 (BP Location: Left Arm)   Pulse (!) 46   Temp 97.5 F (36.4 C)   Resp 11   Wt 206 lb (93.4 kg)   SpO2 94%   BMI 29.56 kg/m   Physical Exam  Constitutional: He is oriented to person, place, and time. He appears  well-developed and well-nourished.  HENT:  Head: Normocephalic and atraumatic.  Eyes: EOM are normal.  Neck: Normal range of motion.  Cardiovascular: Normal rate, regular rhythm, normal heart sounds and intact distal pulses.   Pulmonary/Chest: Effort normal and breath sounds normal. No respiratory distress.  Abdominal: Soft. He exhibits no distension. There is no tenderness.  Musculoskeletal: Normal range of motion.  Neurological: He is alert and oriented to person, place, and time.  Skin: Skin is warm and dry.  Psychiatric: He has a normal mood and affect. Judgment normal.  Nursing note and vitals reviewed.  ED Treatments / Results  Labs (all labs ordered are listed, but only abnormal results are displayed) Labs Reviewed  BASIC METABOLIC PANEL - Abnormal; Notable for the following:       Result Value   Potassium 3.1 (*)    Chloride 97 (*)    CO2 36 (*)    Glucose, Bld 104 (*)    GFR calc non Af Amer 56 (*)    All other components within normal limits  CBC - Abnormal; Notable for the following:    RBC 4.17 (*)    Hemoglobin 12.9 (*)    All other components within normal limits  TROPONIN I  TROPONIN I  TROPONIN I  TROPONIN I  BASIC METABOLIC PANEL  MAGNESIUM    EKG  EKG Interpretation  Date/Time:  Thursday December 23 2016 18:38:49 EST Ventricular Rate:  55 PR Interval:  188 QRS Duration: 130 QT Interval:  494 QTC Calculation: 472 R Axis:   46 Text Interpretation:  Sinus bradycardia Non-specific intra-ventricular conduction block T wave abnormality, consider inferolateral ischemia Abnormal ECG When compared to prior, new T wave inversion in lead AVF, V4, and V6 No STEMI Confirmed by Sherry Ruffing MD, CHRISTOPHER [phone]) on 12/23/2016 11:25:38 PM       Radiology Dg Chest 2 View  Result Date: 12/23/2016 CLINICAL DATA:  Bilateral lower extremity pain worsening over the past year. Complains of generalized chest pain. Former smoker and CABG. EXAM: CHEST  2 VIEW  COMPARISON:  08/22/2016 FINDINGS: The heart is within normal limits for size. There is aortic atherosclerosis at the arch. The patient is status post median sternotomy and post CABG change. Emphysematous hyperinflation of the lungs without pneumonic consolidation, effusion or pneumothorax. Minimal atelectasis at the lung bases. Osteoarthritis of the [facility] joints right worse than left. IMPRESSION: No active cardiopulmonary disease. Status post CABG with aortic atherosclerosis. Mild emphysematous hyperinflation of the lungs bilaterally. Electronically Signed   By: Ashley Royalty M.D.   On: 12/23/2016  19:04    Procedures Procedures (including critical care time)  Medications Ordered in ED Medications  ALPRAZolam (XANAX) tablet 1 mg (not administered)  furosemide (LASIX) tablet 20 mg (not administered)  levothyroxine (SYNTHROID, LEVOTHROID) tablet 50 mcg (not administered)  oxyCODONE (Oxy IR/ROXICODONE) immediate release tablet 15 mg (15 mg Oral Given 12/24/16 0220)  potassium chloride SA (K-DUR,KLOR-CON) CR tablet 20 mEq (not administered)  pravastatin (PRAVACHOL) tablet 40 mg (not administered)  albuterol (PROVENTIL) (2.5 MG/3ML) 0.083% nebulizer solution 3 mL (not administered)  amLODipine (NORVASC) tablet 10 mg (not administered)  gabapentin (NEURONTIN) capsule 300 mg (300 mg Oral Given 12/24/16 0220)  hydrochlorothiazide (MICROZIDE) capsule 12.5 mg (not administered)  memantine (NAMENDA) tablet 10 mg (10 mg Oral Given 12/24/16 0221)  tamsulosin (FLOMAX) capsule 0.4 mg (not administered)  clopidogrel (PLAVIX) tablet 75 mg (not administered)  escitalopram (LEXAPRO) tablet 10 mg (not administered)  donepezil (ARICEPT) tablet 10 mg (10 mg Oral Given 12/24/16 0220)  aspirin EC tablet 81 mg (not administered)  nitroGLYCERIN (NITROSTAT) SL tablet 0.4 mg (not administered)  acetaminophen (TYLENOL) tablet 650 mg (not administered)  ondansetron (ZOFRAN) injection 4 mg (not administered)  enoxaparin  (LOVENOX) injection 40 mg (not administered)  zolpidem (AMBIEN) tablet 5 mg (5 mg Oral Given 12/24/16 0221)  polyethylene glycol (MIRALAX / GLYCOLAX) packet 17 g (not administered)  bisacodyl (DULCOLAX) EC tablet 5 mg (not administered)  magnesium sulfate IVPB 1 g 100 mL (1 g Intravenous Given 12/24/16 0219)  aspirin chewable tablet 324 mg (324 mg Oral Given 12/24/16 0219)    Or  aspirin suppository 300 mg ( Rectal See Alternative 12/24/16 0219)  potassium chloride SA (K-DUR,KLOR-CON) CR tablet 40 mEq (40 mEq Oral Given 12/24/16 0219)    DIAGNOSTIC STUDIES:  Oxygen Saturation is 94% on RA, normal by my interpretation.    COORDINATION OF CARE:  10:50 PM Discussed treatment plan with pt at bedside and pt agreed to plan.  Initial Impression / Assessment and Plan / ED Course  I have reviewed the triage vital signs and the nursing notes.  Pertinent labs & imaging results that were available during my care of the patient were reviewed by me and considered in my medical decision making (see chart for details).  Clinical Course    WINFORD WACHA is a 82 y.o. male with PMHx of arthritis, MI and Alzheimer's Disease, who presents to the Emergency Department complaining of Worsening of chronic foot pain  As well as intermittent chest pain.  Patient describes chest pain with associated shortness of breath, lightheadedness, diaphoresis, nausea, and radiation to his left shoulder. He reported improved with nitroglycerin, which she has taken 20 of this week, and is sharp and pressure like.  On exam, patient had clear lungs. Just was nontender. Abdomen nontender. Lower extremities had very mild edema and some darkened skin on the feet. Patient had 2+ DP pulses bilaterally. No focal neurologic deficits.  Given patient's high risk chest pain and his history of both coronary bypass and PCI, patient will be worked out for chest pain.  EKG showed some T-wave inversions.  Chest x-ray shows no  pneumonia. Lab testing showed initial negative troponin and no leukocytosis.  Given description of chest pain and his history, patient will be admitted for further workup of chest pain. Patient admitted to hospitalist service in stable condition.      I personally performed the services described in this documentation, which was scribed in my presence. The recorded information has been reviewed and is accurate.  Final Clinical Impressions(s) / ED Diagnoses   Final diagnoses:  Chest pain, unspecified type     Clinical Impression: 1. Chest pain, unspecified type     Disposition: Admit to Hospitalist service    Courtney Paris, MD 12/24/16 [phone]

## 2016-12-23 NOTE — Telephone Encounter (Signed)
Per patient's wife patient's feet are black and she can't feel a pulse in them. States they have been that way for several months. No appointments available. He is scheduled for 01/04/17 w/ Dr.McDowell. / tg

## 2016-12-23 NOTE — ED Notes (Signed)
Pt began having chest pain while in the waiting room. Pt took 2 of his nitro from home. Pt denies any pain at this time. EKG taken and given to EDP

## 2016-12-23 NOTE — Telephone Encounter (Signed)
I spoke with Arnold Long NP and she recommended patient go to the ED to be evaluated,wife agreed

## 2016-12-23 NOTE — ED Notes (Signed)
Pt denying chest pain to me at this time. Pt does admit to taking nitro almost daily for chest pain. The pt was instructed that he is to take 3 nitros and if his chest pain is unrelieved that he is to come to the ED or call 911. Pt c/o bilateral foot pain.

## 2016-12-23 NOTE — ED Triage Notes (Signed)
Wife states has been having bilateral foot pain and skin darkening over the last year. Pt was told to come here by PCP. Pt states he has trouble walking because of pain. Pt has bilateral dorsalis pedis pulses with cap refill < 3 seconds.

## 2016-12-23 NOTE — ED Notes (Addendum)
Concerned about pt HR I have asked CN BN  to check on pt with me.pt stated he has taken about 20 nitro pills this week. states pain is getting worse.

## 2016-12-24 ENCOUNTER — Observation Stay (HOSPITAL_COMMUNITY): Payer: PPO

## 2016-12-24 ENCOUNTER — Encounter: Payer: Self-pay | Admitting: *Deleted

## 2016-12-24 ENCOUNTER — Encounter (HOSPITAL_COMMUNITY): Payer: Self-pay

## 2016-12-24 ENCOUNTER — Other Ambulatory Visit: Payer: Self-pay | Admitting: *Deleted

## 2016-12-24 DIAGNOSIS — E039 Hypothyroidism, unspecified: Secondary | ICD-10-CM | POA: Diagnosis not present

## 2016-12-24 DIAGNOSIS — R001 Bradycardia, unspecified: Secondary | ICD-10-CM

## 2016-12-24 DIAGNOSIS — R0602 Shortness of breath: Secondary | ICD-10-CM | POA: Diagnosis not present

## 2016-12-24 DIAGNOSIS — I2510 Atherosclerotic heart disease of native coronary artery without angina pectoris: Secondary | ICD-10-CM

## 2016-12-24 DIAGNOSIS — E876 Hypokalemia: Secondary | ICD-10-CM

## 2016-12-24 DIAGNOSIS — M79672 Pain in left foot: Secondary | ICD-10-CM

## 2016-12-24 DIAGNOSIS — I25119 Atherosclerotic heart disease of native coronary artery with unspecified angina pectoris: Secondary | ICD-10-CM

## 2016-12-24 DIAGNOSIS — R110 Nausea: Secondary | ICD-10-CM | POA: Diagnosis not present

## 2016-12-24 DIAGNOSIS — I200 Unstable angina: Secondary | ICD-10-CM

## 2016-12-24 DIAGNOSIS — R0789 Other chest pain: Secondary | ICD-10-CM | POA: Diagnosis not present

## 2016-12-24 DIAGNOSIS — I10 Essential (primary) hypertension: Secondary | ICD-10-CM

## 2016-12-24 DIAGNOSIS — I5032 Chronic diastolic (congestive) heart failure: Secondary | ICD-10-CM | POA: Diagnosis present

## 2016-12-24 DIAGNOSIS — F0390 Unspecified dementia without behavioral disturbance: Secondary | ICD-10-CM

## 2016-12-24 DIAGNOSIS — R079 Chest pain, unspecified: Secondary | ICD-10-CM | POA: Diagnosis not present

## 2016-12-24 DIAGNOSIS — M79671 Pain in right foot: Secondary | ICD-10-CM

## 2016-12-24 DIAGNOSIS — M179 Osteoarthritis of knee, unspecified: Secondary | ICD-10-CM | POA: Diagnosis not present

## 2016-12-24 DIAGNOSIS — Z87891 Personal history of nicotine dependence: Secondary | ICD-10-CM | POA: Diagnosis not present

## 2016-12-24 DIAGNOSIS — R072 Precordial pain: Secondary | ICD-10-CM

## 2016-12-24 LAB — BASIC METABOLIC PANEL
Anion gap: 8 (ref 5–15)
BUN: 16 mg/dL (ref 6–20)
CHLORIDE: 98 mmol/L — AB (ref 101–111)
CO2: 32 mmol/L (ref 22–32)
Calcium: 9 mg/dL (ref 8.9–10.3)
Creatinine, Ser: 0.95 mg/dL (ref 0.61–1.24)
GFR calc Af Amer: >60 mL/min (ref >60)
GLUCOSE: 105 mg/dL — AB (ref 65–99)
POTASSIUM: 3.2 mmol/L — AB (ref 3.5–5.1)
Sodium: 138 mmol/L (ref 135–145)

## 2016-12-24 LAB — TROPONIN I
Troponin I: <0.03 ng/mL (ref <0.03)
Troponin I: <0.03 ng/mL (ref <0.03)

## 2016-12-24 LAB — TSH: TSH: 5.439 u[IU]/mL — AB (ref 0.350–4.500)

## 2016-12-24 LAB — MAGNESIUM: MAGNESIUM: 1.9 mg/dL (ref 1.7–2.4)

## 2016-12-24 MED ORDER — ESCITALOPRAM OXALATE 10 MG PO TABS
10.0000 mg | ORAL_TABLET | Freq: Every day | ORAL | Status: DC
  Administered 2016-12-24 – 2016-12-25 (×2): 10 mg via ORAL
  Filled 2016-12-24 (×2): qty 1

## 2016-12-24 MED ORDER — ASPIRIN 300 MG RE SUPP: 300.0000 mg | RECTAL | Status: AC

## 2016-12-24 MED ORDER — ASPIRIN EC 81 MG PO TBEC
81.0000 mg | DELAYED_RELEASE_TABLET | Freq: Every day | ORAL | Status: DC
  Administered 2016-12-25: 81 mg via ORAL
  Filled 2016-12-24: qty 1

## 2016-12-24 MED ORDER — MEMANTINE HCL 10 MG PO TABS
10.0000 mg | ORAL_TABLET | Freq: Two times a day (BID) | ORAL | Status: DC
  Administered 2016-12-24 – 2016-12-25 (×4): 10 mg via ORAL
  Filled 2016-12-24 (×4): qty 1

## 2016-12-24 MED ORDER — POTASSIUM CHLORIDE CRYS ER 20 MEQ PO TBCR
20.0000 meq | EXTENDED_RELEASE_TABLET | Freq: Every day | ORAL | Status: DC
  Filled 2016-12-24: qty 1

## 2016-12-24 MED ORDER — ASPIRIN 81 MG PO CHEW
324.0000 mg | CHEWABLE_TABLET | ORAL | Status: AC
  Administered 2016-12-24: 324 mg via ORAL
  Filled 2016-12-24: qty 4

## 2016-12-24 MED ORDER — OXYCODONE HCL 5 MG PO TABS
15.0000 mg | ORAL_TABLET | Freq: Three times a day (TID) | ORAL | Status: DC
  Administered 2016-12-24 – 2016-12-25 (×5): 15 mg via ORAL
  Filled 2016-12-24 (×5): qty 3

## 2016-12-24 MED ORDER — POTASSIUM CHLORIDE CRYS ER 20 MEQ PO TBCR
40.0000 meq | EXTENDED_RELEASE_TABLET | Freq: Once | ORAL | Status: AC
  Administered 2016-12-24: 40 meq via ORAL
  Filled 2016-12-24: qty 2

## 2016-12-24 MED ORDER — MAGNESIUM SULFATE IN D5W 1-5 GM/100ML-% IV SOLN
INTRAVENOUS | Status: AC
  Filled 2016-12-24: qty 100

## 2016-12-24 MED ORDER — BISACODYL 5 MG PO TBEC: 5.0000 mg | DELAYED_RELEASE_TABLET | Freq: Every day | ORAL | Status: DC | PRN

## 2016-12-24 MED ORDER — LEVOTHYROXINE SODIUM 50 MCG PO TABS
50.0000 ug | ORAL_TABLET | Freq: Every day | ORAL | Status: DC
  Administered 2016-12-24 – 2016-12-25 (×2): 50 ug via ORAL
  Filled 2016-12-24 (×2): qty 1

## 2016-12-24 MED ORDER — ALPRAZOLAM 0.5 MG PO TABS
1.0000 mg | ORAL_TABLET | Freq: Four times a day (QID) | ORAL | Status: DC | PRN
  Administered 2016-12-25: 1 mg via ORAL
  Filled 2016-12-24: qty 2

## 2016-12-24 MED ORDER — MAGNESIUM SULFATE IN D5W 1-5 GM/100ML-% IV SOLN
1.0000 g | Freq: Once | INTRAVENOUS | Status: AC
  Administered 2016-12-24: 1 g via INTRAVENOUS

## 2016-12-24 MED ORDER — POTASSIUM CHLORIDE CRYS ER 20 MEQ PO TBCR
40.0000 meq | EXTENDED_RELEASE_TABLET | Freq: Four times a day (QID) | ORAL | Status: AC
  Administered 2016-12-24 (×2): 40 meq via ORAL
  Filled 2016-12-24 (×2): qty 2

## 2016-12-24 MED ORDER — TAMSULOSIN HCL 0.4 MG PO CAPS
0.4000 mg | ORAL_CAPSULE | Freq: Every day | ORAL | Status: DC
  Administered 2016-12-24 – 2016-12-25 (×2): 0.4 mg via ORAL
  Filled 2016-12-24 (×2): qty 1

## 2016-12-24 MED ORDER — PRAVASTATIN SODIUM 40 MG PO TABS
40.0000 mg | ORAL_TABLET | Freq: Every day | ORAL | Status: DC
  Administered 2016-12-24: 40 mg via ORAL
  Filled 2016-12-24 (×2): qty 1

## 2016-12-24 MED ORDER — ONDANSETRON HCL 4 MG/2ML IJ SOLN: 4.0000 mg | Freq: Four times a day (QID) | INTRAMUSCULAR | Status: DC | PRN

## 2016-12-24 MED ORDER — POLYETHYLENE GLYCOL 3350 17 G PO PACK: 17.0000 g | PACK | Freq: Every day | ORAL | Status: DC | PRN

## 2016-12-24 MED ORDER — GABAPENTIN 300 MG PO CAPS
300.0000 mg | ORAL_CAPSULE | Freq: Two times a day (BID) | ORAL | Status: DC
  Administered 2016-12-24 – 2016-12-25 (×4): 300 mg via ORAL
  Filled 2016-12-24 (×4): qty 1

## 2016-12-24 MED ORDER — ZOLPIDEM TARTRATE 5 MG PO TABS
5.0000 mg | ORAL_TABLET | Freq: Every evening | ORAL | Status: DC | PRN
  Administered 2016-12-24: 5 mg via ORAL
  Filled 2016-12-24: qty 1

## 2016-12-24 MED ORDER — ACETAMINOPHEN 325 MG PO TABS: 650.0000 mg | ORAL_TABLET | ORAL | Status: DC | PRN

## 2016-12-24 MED ORDER — ALBUTEROL SULFATE (2.5 MG/3ML) 0.083% IN NEBU: 3.0000 mL | INHALATION_SOLUTION | Freq: Four times a day (QID) | RESPIRATORY_TRACT | Status: DC | PRN

## 2016-12-24 MED ORDER — NITROGLYCERIN 0.4 MG SL SUBL: 0.4000 mg | SUBLINGUAL_TABLET | SUBLINGUAL | Status: DC | PRN

## 2016-12-24 MED ORDER — CLOPIDOGREL BISULFATE 75 MG PO TABS
75.0000 mg | ORAL_TABLET | Freq: Every day | ORAL | Status: DC
  Administered 2016-12-24 – 2016-12-25 (×2): 75 mg via ORAL
  Filled 2016-12-24 (×2): qty 1

## 2016-12-24 MED ORDER — HYDROCHLOROTHIAZIDE 12.5 MG PO CAPS
12.5000 mg | ORAL_CAPSULE | Freq: Every day | ORAL | Status: DC
  Administered 2016-12-24 – 2016-12-25 (×2): 12.5 mg via ORAL
  Filled 2016-12-24 (×2): qty 1

## 2016-12-24 MED ORDER — FUROSEMIDE 40 MG PO TABS
20.0000 mg | ORAL_TABLET | Freq: Every day | ORAL | Status: DC
  Administered 2016-12-24 – 2016-12-25 (×2): 20 mg via ORAL
  Filled 2016-12-24 (×2): qty 1

## 2016-12-24 MED ORDER — DONEPEZIL HCL 5 MG PO TABS
10.0000 mg | ORAL_TABLET | Freq: Every day | ORAL | Status: DC
  Administered 2016-12-24 (×2): 10 mg via ORAL
  Filled 2016-12-24: qty 1
  Filled 2016-12-24 (×2): qty 2

## 2016-12-24 MED ORDER — POTASSIUM CHLORIDE CRYS ER 20 MEQ PO TBCR
20.0000 meq | EXTENDED_RELEASE_TABLET | Freq: Every day | ORAL | Status: DC
  Administered 2016-12-25: 20 meq via ORAL
  Filled 2016-12-24: qty 1

## 2016-12-24 MED ORDER — AMLODIPINE BESYLATE 5 MG PO TABS
10.0000 mg | ORAL_TABLET | Freq: Every day | ORAL | Status: DC
  Administered 2016-12-24 – 2016-12-25 (×2): 10 mg via ORAL
  Filled 2016-12-24 (×2): qty 2

## 2016-12-24 MED ORDER — ENOXAPARIN SODIUM 40 MG/0.4ML ~~LOC~~ SOLN
40.0000 mg | SUBCUTANEOUS | Status: DC
  Administered 2016-12-24 – 2016-12-25 (×2): 40 mg via SUBCUTANEOUS
  Filled 2016-12-24 (×3): qty 0.4

## 2016-12-24 NOTE — Evaluation (Signed)
Physical Therapy Evaluation Patient Details Name: Dustin [name] MRN: [id number] DOB: [birth year] Today's Date: 12/24/2016   History of Present Illness  Dustin [name] is a 82 y.o. male with medical history significant for coronary artery disease status post CABG in 1996, hypertension, hypothyroidism, and obstructive sleep apnea who presents to the emergency department for evaluation of pain in the bilateral feet, and intermittent chest pain. Patient reports several months of progressive darkening and pain in the bilateral feet, and also notes intermittent chest pain for the past several weeks, also worsening. Patient denies any recent fevers or chills and denies any cough or dyspnea. He describes intermittent severe, sharp, localized pain in the bilateral feet without any appreciable alleviating or exacerbating factors. He reports that the feet become discolored over this interval and believes there may be a problem with his circulation. He also notes that for the past several weeks, he has had recurrent episodes of sharp pain in the left chest with radiation to the left neck and left shoulder, but without diaphoresis or dyspnea. These episodes come both at rest and with activity and are often relieved by nitroglycerin. He had an episode while waiting in the emergency department waiting room, took 2 sublingual nitroglycerin, and reports subsequent resolution of his pain. He reports taking approximately 20 nitroglycerin in the past week. He is followed by cardiology and reports having a regularly scheduled appointment in just over a week.   Clinical Impression  Pt states that he normally has difficulty walking drifting from side to side; states this has been like this for years but will not use his assistive device.  Pt is at prior functional level but will benefit from outpatient services to improve pt gait stability.l    Follow Up Recommendations Outpatient PT    Equipment Recommendations  None  recommended by PT (urged pt to use his cane at home.)    Recommendations for Other Services       Precautions / Restrictions Precautions Precautions: None Restrictions Weight Bearing Restrictions: No      Mobility  Bed Mobility Overal bed mobility: Independent                Transfers Overall transfer level: Independent                  Ambulation/Gait Ambulation/Gait assistance: Independent Ambulation Distance (Feet): 200 Feet Assistive device: None Gait Pattern/deviations: Decreased dorsiflexion - right;Decreased dorsiflexion - left;Drifts right/left        Stairs            Wheelchair Mobility    Modified Rankin (Stroke Patients Only)       Balance                                             Pertinent Vitals/Pain Pain Assessment: No/denies pain    Home Living Family/patient expects to be discharged to:: Private residence Living Arrangements: Spouse/significant other Available Help at Discharge: Family Type of Home: House Home Access: Level entry     Home Layout: One level Home Equipment: Environmental consultant - 2 wheels;Cane - single point Additional Comments: Pt states that he does not use the assistive devices.    Prior Function                 Hand Dominance        Extremity/Trunk Assessment  Lower Extremity Assessment Lower Extremity Assessment: Overall WFL for tasks assessed       Communication      Cognition Arousal/Alertness: Awake/alert Behavior During Therapy: WFL for tasks assessed/performed Overall Cognitive Status: Within Functional Limits for tasks assessed                      General Comments      Exercises     Assessment/Plan    PT Assessment All further PT needs can be met in the next venue of care  PT Problem List Decreased balance;Decreased activity tolerance          PT Treatment Interventions      PT Goals (Current goals can be found in the Care Plan  section)       Frequency     Barriers to discharge        Co-evaluation               End of Session Equipment Utilized During Treatment: Gait belt Activity Tolerance: Patient tolerated treatment well Patient left: in bed Nurse Communication: Mobility status    Functional Limitation: Mobility: Walking and moving around Mobility: Walking and Moving Around Current Status (M8592): At least 20 percent but less than 40 percent impaired, limited or restricted Mobility: Walking and Moving Around Goal Status [phone]): At least 20 percent but less than 40 percent impaired, limited or restricted Mobility: Walking and Moving Around Discharge Status [phone]): At least 20 percent but less than 40 percent impaired, limited or restricted    Time: 1300-1322 PT Time Calculation (min) (ACUTE ONLY): 22 min   Charges:         PT G Codes:   PT G-Codes **NOT FOR INPATIENT CLASS** Functional Limitation: Mobility: Walking and moving around Mobility: Walking and Moving Around Current Status (L9444): At least 20 percent but less than 40 percent impaired, limited or restricted Mobility: Walking and Moving Around Goal Status [phone]): At least 20 percent but less than 40 percent impaired, limited or restricted Mobility: Walking and Moving Around Discharge Status [phone]): At least 20 percent but less than 40 percent impaired, limited or restricted   Rayetta Humphrey, PT CLT [phone] 12/24/2016, 1:23 PM

## 2016-12-24 NOTE — Progress Notes (Signed)
PROGRESS NOTE                                                                                                                                                                                                             Patient Demographics:    Dustin [name], is a 82 y.o. male, DOB - [birth year], [id number]  Admit date - 12/23/2016   Admitting Physician Vianne Bulls, MD  Outpatient Primary MD for the patient is Dustin [name]., MD  LOS - 0  Chief Complaint  Patient presents with  . Foot Pain  . Chest Pain       Brief Narrative   Dustin [name] is a 82 y.o. male with medical history significant for coronary artery disease status post CABG in 1996, hypertension, hypothyroidism, and obstructive sleep apnea who presents to the emergency department for evaluation of pain in the bilateral feet, and intermittent chest pain. Patient reports several months of progressive darkening and pain in the bilateral feet, and also notes intermittent chest pain for the past several weeks.     Subjective:    [name] today has, No headache, No chest pain, No abdominal pain - No Nausea, No new weakness tingling or numbness, No Cough - SOB. Some bilateral chronic foot pain.   Assessment  & Plan :     1. Intermittent chest pain. He sounded atypical, history of CAD and CABG in the past. He ruled out for MI, EKG has nonspecific changes, he is currently chest pain-free, currently on aspirin, Plavix, no beta blocker due to low heart rate, continue statin. Cardiology to evaluate. Most likely outpatient stress test would be appropriate.  2. Sinus bradycardia. Hold beta blocker check TSH and monitor on telemetry.  3. Hypertension. Currently on Norvasc and HCTZ. Stable.  4. Bilateral lower extremity foot pain which is chronic but gradually worsening over the last several years. Check ABI, suspect he has PAD, he already has erectile  dysfunction and CAD. For now continue dual antiplatelet therapy and statin for secondary prevention.  5. Dementia. At risk for delirium. Continue Aricept, minimize benzos and narcotics.  6. Hypothyroidism. Check TSH and continue home dose Synthroid for now.  7. Dyslipidemia. On statin continue.  8. Chronic right knee pain. Will check x-ray.  9. Chronic diastolic CHF last EF [id number]. Compensated.    Family Communication  :  None  Code Status :  Full  Diet : Diet Heart Room service appropriate? Yes; Fluid consistency: Thin   Disposition Plan  :  Home 1-2 days  Consults  :  Cards  Procedures  :    ABI  DVT Prophylaxis  :  Lovenox    Lab Results  Component Value Date   PLT 216 12/23/2016    Inpatient Medications  Scheduled Meds: . amLODipine  10 mg Oral Daily  . [START ON 12/25/2016] aspirin EC  81 mg Oral Daily  . clopidogrel  75 mg Oral Daily  . donepezil  10 mg Oral QHS  . enoxaparin (LOVENOX) injection  40 mg Subcutaneous Q24H  . escitalopram  10 mg Oral Daily  . furosemide  20 mg Oral Daily  . gabapentin  300 mg Oral BID  . hydrochlorothiazide  12.5 mg Oral Daily  . levothyroxine  50 mcg Oral QAC breakfast  . memantine  10 mg Oral BID  . oxycodone  15 mg Oral TID  . [START ON 12/25/2016] potassium chloride SA  20 mEq Oral Daily  . potassium chloride  40 mEq Oral Q6H  . pravastatin  40 mg Oral q1800  . tamsulosin  0.4 mg Oral Daily   Continuous Infusions: PRN Meds:.acetaminophen, albuterol, ALPRAZolam, bisacodyl, nitroGLYCERIN, ondansetron (ZOFRAN) IV, polyethylene glycol, zolpidem  Antibiotics  :    Anti-infectives    None         Objective:   Vitals:   12/24/16 0030 12/24/16 0100 12/24/16 0201 12/24/16 0653  BP: (!) 158/49 149/57 (!) 132/57 (!) 128/51  Pulse: (!) 51 (!) 49 (!) 48 (!) 42  Resp: 14 18  18   Temp:   97.9 F (36.6 C) 97.5 F (36.4 C)  TempSrc:   Oral Oral  SpO2: 93% 93% 93% 97%  Weight:   95.3 kg (210 lb)   Height:   5\' 9"   (1.753 m)     Wt Readings from Last 3 Encounters:  12/24/16 95.3 kg (210 lb)  04/06/16 93.8 kg (206 lb 11.2 oz)  04/14/15 85.3 kg (188 lb)     Intake/Output Summary (Last 24 hours) at 12/24/16 1006 Last data filed at 12/24/16 [id number]  Gross per 24 hour  Intake              340 ml  Output              300 ml  Net               40 ml     Physical Exam  Awake Alert, Oriented X 3, No new F.N deficits, Normal affect [place].AT,PERRAL Supple Neck,No JVD, No cervical lymphadenopathy appriciated.  Symmetrical Chest wall movement, Good air movement bilaterally, CTAB RRR,No Gallops,Rubs or new Murmurs, No Parasternal Heave +ve B.Sounds, Abd Soft, No tenderness, No organomegaly appriciated, No rebound - guarding or rigidity. No Cyanosis, Clubbing or edema, No new Rash or bruise       Data Review:    CBC  Recent Labs Lab 12/23/16 2033  WBC 8.1  HGB 12.9*  HCT 39.2  PLT 216  MCV 94.0  MCH 30.9  MCHC 32.9  RDW 14.0    Chemistries   Recent Labs Lab 12/23/16 2033 12/24/16 0144  NA 139 138  K 3.1* 3.2*  CL 97* 98*  CO2 36* 32  GLUCOSE 104* 105*  BUN 12 16  CREATININE 1.18 0.95  CALCIUM 9.2 9.0  MG  --  1.9   ------------------------------------------------------------------------------------------------------------------ No  results for input(s): CHOL, HDL, LDLCALC, TRIG, CHOLHDL, LDLDIRECT in the last 72 hours.  Lab Results  Component Value Date   HGBA1C 5.7 (H) 06/29/2014   ------------------------------------------------------------------------------------------------------------------ No results for input(s): TSH, T4TOTAL, T3FREE, THYROIDAB in the last 72 hours.  Invalid input(s): FREET3 ------------------------------------------------------------------------------------------------------------------ No results for input(s): VITAMINB12, FOLATE, FERRITIN, TIBC, IRON, RETICCTPCT in the last 72 hours.  Coagulation profile No results for input(s): INR, PROTIME in  the last 168 hours.  No results for input(s): DDIMER in the last 72 hours.  Cardiac Enzymes  Recent Labs Lab 12/23/16 2033 12/24/16 0144 12/24/16 0743  TROPONINI <0.03 <0.03 <0.03   ------------------------------------------------------------------------------------------------------------------    Component Value Date/Time   BNP 173.0 (H) 04/03/2016 1006    Micro Results No results found for this or any previous visit (from the past 240 hour(s)).  Radiology Reports Dg Chest 2 View  Result Date: 12/23/2016 CLINICAL DATA:  Bilateral lower extremity pain worsening over the past year. Complains of generalized chest pain. Former smoker and CABG. EXAM: CHEST  2 VIEW COMPARISON:  08/22/2016 FINDINGS: The heart is within normal limits for size. There is aortic atherosclerosis at the arch. The patient is status post median sternotomy and post CABG change. Emphysematous hyperinflation of the lungs without pneumonic consolidation, effusion or pneumothorax. Minimal atelectasis at the lung bases. Osteoarthritis of the [facility] joints right worse than left. IMPRESSION: No active cardiopulmonary disease. Status post CABG with aortic atherosclerosis. Mild emphysematous hyperinflation of the lungs bilaterally. Electronically Signed   By: Ashley Royalty M.D.   On: 12/23/2016 19:04    Time Spent in minutes  30   SINGH,PRASHANT K M.D on 12/24/2016 at 10:06 AM  Between 7am to 7pm - Pager - [phone]  After 7pm go to [email or web address] - password [facility]  [name] -  Office  [phone]

## 2016-12-24 NOTE — Progress Notes (Signed)
**Note De-identified  Obfuscation** EKG complete; results reported to RN 

## 2016-12-24 NOTE — Care Management Obs Status (Signed)
[facility] NOTIFICATION   Patient Details  Name: Dustin [name] MRN: [id number] Date of Birth: Mar 07, 1934   Medicare Observation Status Notification Given:  Yes    Hoorain Kozakiewicz, Chauncey Reading, RN 12/24/2016, 11:25 AM

## 2016-12-24 NOTE — H&P (Signed)
History and Physical    Dustin [name] [id number] DOB: 1934/06/10 DOA: 12/23/2016  PCP: Glo Herring., MD   Patient coming from: Home  Chief Complaint: Bilateral foot pain, chest pain   HPI: Dustin [name] is a 82 y.o. male with medical history significant for coronary artery disease status post CABG in 1996, hypertension, hypothyroidism, and obstructive sleep apnea who presents to the emergency department for evaluation of pain in the bilateral feet, and intermittent chest pain. Patient reports several months of progressive darkening and pain in the bilateral feet, and also notes intermittent chest pain for the past several weeks, also worsening. Patient denies any recent fevers or chills and denies any cough or dyspnea. He describes intermittent severe, sharp, localized pain in the bilateral feet without any appreciable alleviating or exacerbating factors. He reports that the feet become discolored over this interval and believes there may be a problem with his circulation. He also notes that for the past several weeks, he has had recurrent episodes of sharp pain in the left chest with radiation to the left neck and left shoulder, but without diaphoresis or dyspnea. These episodes come both at rest and with activity and are often relieved by nitroglycerin. He had an episode while waiting in the emergency department waiting room, took 2 sublingual nitroglycerin, and reports subsequent resolution of his pain. He reports taking approximately 20 nitroglycerin in the past week. He is followed by cardiology and reports having a regularly scheduled appointment in just over a week.   ED Course: Upon arrival to the ED, patient is found to be afebrile, saturating well on room air, bradycardic to the high 40s, and with normal blood pressure. EKG features a sinus bradycardia with rate 55, nonspecific interventricular conduction delay, and T-wave inversion in the inferolateral leads. Chest x-ray is  negative for acute cardiopulmonary disease. Chemistry panels notable for potassium of 3.1 and CBC features a mild and stable normocytic anemia. Troponin is undetectable. Patient remained bradycardic in the emergency department with heart rate ranging from the mid 40s to 60 with stable blood pressure. He has not experienced a recurrence in his chest pain since taking nitroglycerin while in triage. He will be observed on the telemetry unit for ongoing evaluation and management of chest pain concerning for unstable angina, and bilateral foot pain of uncertain etiology.  Review of Systems:  All other systems reviewed and apart from HPI, are negative.  Past Medical History:  Diagnosis Date  . Alzheimer disease   . Anxiety   . Arthritis   . Atrophic gastritis    a. By EGD 02/2013.  [name] Coronary atherosclerosis of native coronary artery    a. Multivessel s/p CABG 1996. b. s/p DES x 2 SVG to PDA 8/12 with distal disease managed medically.  . DDD (degenerative disc disease)    Chronic back pain  . Enlarged prostate   . Essential hypertension, benign   . Hematuria   . Hypothyroidism   . MI (myocardial infarction)   . Mixed hyperlipidemia   . OA (osteoarthritis)   . OSA (obstructive sleep apnea)     Past Surgical History:  Procedure Laterality Date  . COLONOSCOPY  08/03/2004   Jenkins-numerous large diverticula in the descending, transverse, descending, and sigmoid colon. Otherwise normal exam.  . COLONOSCOPY  07/12/2012   RMR: External hemorrhoidal tag; multiple rectal and colonic polyps removed and/or treated as described above. Pancolonic diverticulosis. Bx-tubular adenomas, rectal hyperplastic polyp. next colonoscopy in 06/2015.  [name] COLONOSCOPY N/A 06/22/2016  Procedure: COLONOSCOPY;  Surgeon: Aviva Signs, MD;  Location: [facility];  Service: Gastroenterology;  Laterality: N/A;  730  . CORONARY ANGIOPLASTY WITH STENT PLACEMENT    . CORONARY ARTERY BYPASS GRAFT  1996   LIMA to LAD, SVG to D2,  SVG to PDA, SVG to OM1 and OM2  . ESOPHAGOGASTRODUODENOSCOPY N/A 03/16/2013   Procedure: ESOPHAGOGASTRODUODENOSCOPY (EGD);  Surgeon: Daneil Dolin, MD;  Location: [facility];  Service: Endoscopy;  Laterality: N/A;  12:00-moved to [place] notified pt  . ESOPHAGOGASTRODUODENOSCOPY N/A 03/06/2013   Procedure: ESOPHAGOGASTRODUODENOSCOPY (EGD);  Surgeon: Daneil Dolin, MD;  Location: [facility];  Service: Endoscopy;  Laterality: N/A;  . HERNIA REPAIR       reports that he quit smoking about 22 years ago. His smoking use included Cigarettes. He has a 80.00 pack-year smoking history. He has never used smokeless tobacco. He reports that he does not drink alcohol or use drugs.  No Known Allergies  Family History  Problem Relation Age of Onset  . Heart disease    . Colon cancer Neg Hx      Prior to Admission medications   Medication Sig Start Date End Date Taking? Authorizing Provider  albuterol (PROVENTIL HFA;VENTOLIN HFA) 108 (90 Base) MCG/ACT inhaler Inhale 2 puffs into the lungs every 6 (six) hours as needed for wheezing or shortness of breath. 04/06/16  Yes Kathie Dike, MD  ALPRAZolam Duanne Moron) 1 MG tablet Take 1 mg by mouth 4 (four) times daily as needed. For anxiety/sleep 12/19/16  Yes Historical Provider, MD  amLODipine (NORVASC) 10 MG tablet Take 1 tablet by mouth daily. 03/05/16  Yes Historical Provider, MD  bisacodyl (BISACODYL) 5 MG EC tablet Take 35 mg by mouth 2 (two) times a week.   Yes Historical Provider, MD  carvedilol (COREG) 3.125 MG tablet Take 1 tablet by mouth 2 (two) times daily. 08/06/14  Yes Historical Provider, MD  clopidogrel (PLAVIX) 75 MG tablet Take 1 tablet (75 mg total) by mouth daily. 01/29/15  Yes Hosie Poisson, MD  donepezil (ARICEPT) 10 MG tablet Take 1 tablet by mouth At bedtime. 07/01/12  Yes Historical Provider, MD  escitalopram (LEXAPRO) 10 MG tablet Take 10 mg by mouth daily.   Yes Historical Provider, MD  furosemide (LASIX) 20 MG tablet Take 20 mg  by mouth daily.  12/03/16  Yes Historical Provider, MD  gabapentin (NEURONTIN) 300 MG capsule Take 1 capsule by mouth 2 (two) times daily.  01/08/16  Yes Historical Provider, MD  hydrochlorothiazide (MICROZIDE) 12.5 MG capsule Take 1 capsule by mouth daily. 03/05/16  Yes Historical Provider, MD  levothyroxine (SYNTHROID, LEVOTHROID) 50 MCG tablet Take 50 mcg by mouth daily. 10/04/16  Yes Historical Provider, MD  memantine (NAMENDA) 10 MG tablet Take 1 tablet by mouth 2 (two) times daily. 03/05/16  Yes Historical Provider, MD  nitroGLYCERIN (NITROSTAT) 0.4 MG SL tablet PLACE ONE TABLET UNDER TONGUE EVERY 5 MIN UP TO 3 DOSES AS NEEDED FORCHEST PAIN. 08/31/16  Yes Lendon Colonel, NP  oxycodone (ROXICODONE) 30 MG immediate release tablet Take 15 mg by mouth 3 (three) times daily. 12/09/16  Yes Historical Provider, MD  potassium chloride SA (K-DUR,KLOR-CON) 20 MEQ tablet Take 20 mEq by mouth daily. 12/13/16  Yes Historical Provider, MD  pravastatin (PRAVACHOL) 40 MG tablet TAKE (1) TABLET BY MOUTH AT BEDTIME. 06/21/16  Yes Lendon Colonel, NP  tamsulosin (FLOMAX) 0.4 MG CAPS capsule Take 1 capsule by mouth daily. 02/23/16  Yes Historical Provider, MD  Physical Exam: Vitals:   12/23/16 2200 12/23/16 2230 12/23/16 2300 12/23/16 2330  BP: 136/57 142/58 155/55 129/62  Pulse: (!) 45 (!) 47 (!) 44 (!) 46  Resp: 13 19 17 15   Temp:      TempSrc:      SpO2: 94% 96% 94% 93%  Weight:          Constitutional: NAD, calm, comfortable Eyes: PERTLA, lids and conjunctivae normal ENMT: Mucous membranes are moist. Posterior pharynx clear of any exudate or lesions.   Neck: normal, supple, no masses, no thyromegaly Respiratory: clear to auscultation bilaterally, no wheezing, no crackles. Normal respiratory effort.  Cardiovascular: Rate ~60 and regular. 1+ pretibial edema bilaterally. 2+ pedal pulses. No significant JVD. Abdomen: No distension, no tenderness, no masses palpated. Bowel sounds normal.    Musculoskeletal: no clubbing / cyanosis. No joint deformity upper and lower extremities. Normal muscle tone.  Skin: no significant rashes, lesions, ulcers. Warm, dry, well-perfused. Neurologic: CN 2-12 grossly intact. Sensation intact, DTR normal. Strength 5/5 in all 4 limbs.  Psychiatric: Normal judgment and insight. Alert and oriented x 3. Normal mood and affect.     Labs on Admission: I have personally reviewed following labs and imaging studies  CBC:  Recent Labs Lab 12/23/16 2033  WBC 8.1  HGB 12.9*  HCT 39.2  MCV 94.0  PLT 123XX123   Basic Metabolic Panel:  Recent Labs Lab 12/23/16 2033  NA 139  K 3.1*  CL 97*  CO2 36*  GLUCOSE 104*  BUN 12  CREATININE 1.18  CALCIUM 9.2   GFR: CrCl cannot be calculated (Unknown ideal weight.). Liver Function Tests: No results for input(s): AST, ALT, ALKPHOS, BILITOT, PROT, ALBUMIN in the last 168 hours. No results for input(s): LIPASE, AMYLASE in the last 168 hours. No results for input(s): AMMONIA in the last 168 hours. Coagulation Profile: No results for input(s): INR, PROTIME in the last 168 hours. Cardiac Enzymes:  Recent Labs Lab 12/23/16 2033  TROPONINI <0.03   BNP (last 3 results) No results for input(s): PROBNP in the last 8760 hours. HbA1C: No results for input(s): HGBA1C in the last 72 hours. CBG: No results for input(s): GLUCAP in the last 168 hours. Lipid Profile: No results for input(s): CHOL, HDL, LDLCALC, TRIG, CHOLHDL, LDLDIRECT in the last 72 hours. Thyroid Function Tests: No results for input(s): TSH, T4TOTAL, FREET4, T3FREE, THYROIDAB in the last 72 hours. Anemia Panel: No results for input(s): VITAMINB12, FOLATE, FERRITIN, TIBC, IRON, RETICCTPCT in the last 72 hours. Urine analysis:    Component Value Date/Time   COLORURINE YELLOW 10/15/2016 [place] 10/15/2016 1115   LABSPEC 1.025 10/15/2016 1115   PHURINE 6.0 10/15/2016 1115   GLUCOSEU NEGATIVE 10/15/2016 1115   HGBUR TRACE  (A) 10/15/2016 1115   BILIRUBINUR NEGATIVE 10/15/2016 1115   KETONESUR TRACE (A) 10/15/2016 1115   PROTEINUR 30 (A) 10/15/2016 1115   UROBILINOGEN 0.2 01/26/2015 2247   NITRITE NEGATIVE 10/15/2016 1115   LEUKOCYTESUR TRACE (A) 10/15/2016 1115   Sepsis Labs: @LABRCNTIP (procalcitonin:4,lacticidven:4) )No results found for this or any previous visit (from the past 240 hour(s)).   Radiological Exams on Admission: Dg Chest 2 View  Result Date: 12/23/2016 CLINICAL DATA:  Bilateral lower extremity pain worsening over the past year. Complains of generalized chest pain. Former smoker and CABG. EXAM: CHEST  2 VIEW COMPARISON:  08/22/2016 FINDINGS: The heart is within normal limits for size. There is aortic atherosclerosis at the arch. The patient is status post median sternotomy and  post CABG change. Emphysematous hyperinflation of the lungs without pneumonic consolidation, effusion or pneumothorax. Minimal atelectasis at the lung bases. Osteoarthritis of the [facility] joints right worse than left. IMPRESSION: No active cardiopulmonary disease. Status post CABG with aortic atherosclerosis. Mild emphysematous hyperinflation of the lungs bilaterally. Electronically Signed   By: Ashley Royalty M.D.   On: 12/23/2016 19:04    EKG: Independently reviewed. Sinus bradycardia (rate 55), non-specific IVCD, inferolateral T-wave inversion  Assessment/Plan  1. Chest pain, CAD  - Pt has hx of CAD s/p remote CABG  - Reports intermittent chest pain for past several weeks, improves or resolves with NTG, of which he reports taking ~20 in the last week  - Pain in ED resolved with SL NTG x2 - Initial troponin <0.03, EKG with T-wave inversion in inferolateral leads - Concern for unstable angina; normal troponin reassuring - Treated with ASA 324 mg, will continue statin; beta-blocker held for HR in mid 40s-50s - Monitor on telemetry, obtain serial cardiac biomarkers, and repeat EKG  - Pt follows with cardiologist, Dr. Domenic Polite  in the outpatient setting   2. Sinus bradycardia  - HR in mid 40s-50s on admission, likely secondary to the beta-blocker  - Plan to hold Coreg tonight, consider resumption in am pending HR  - Serial troponin measurements as above given concern for possible ACS  - Monitoring on telemetry   3. Foot pain, bilateral  - Pt reports progressive intermittent bilateral foot pain for several months; unable to identify alleviating/exacerbating factors   - He reports that both feet have "turned black," but coloration unremarkable on admission  - Possibly related to PAD, but there are palpable pedal pulses bilaterally and no acute ischemia   4. Hypokalemia  - Serum potassium is 3.1 on admission, likely secondary to diuretics  - He will be treated with 40 mEq oral potassium now and continued on daily oral supplement - Repeat chem panel tomorrow    5. Hypothyroidism  - Appears stable, continue Synthroid   6. Dementia - Stable - Continue Aricept and Namenda    7. Hypertension  - BP at goal on admission  - Coreg held on admission in light of HR in 40s - Continue Norvasc, HCTZ, Lasix   8. Chronic diastolic CHF  - Appears roughly euvolemic on admission  - Continue Lasix as tolerated; resume Coreg as HR allows  - Follow daily wts and I/Os    DVT prophylaxis: sq Lovenox  Code Status: Full  Family Communication: Wife updated at bedside Disposition Plan: Observe on telemetry Consults called: None Admission status: Observation    Vianne Bulls, MD [name] Pager [phone]  If 7PM-7AM, please contact night-coverage [email or web address] Password [email or web address]  12/24/2016, 12:29 AM

## 2016-12-24 NOTE — Care Management Note (Signed)
Case Management Note  Patient Details  Name: MEKO HARTFIEL MRN: [id number] Date of Birth: Feb 15, 1934  Subjective/Objective:  Patient adm with CP. Lives with wife, is ind with ADL's. Has cane and walker if needed. He has PCP, still drives to appointments, and reports no issues affording medications.                    Action/Plan: Anticipate DC home with self care. No CM needs.   Expected Discharge Date:       12/24/2016           Expected Discharge Plan:  Home/Self Care  In-House Referral:  NA  Discharge planning Services  CM Consult  Post Acute Care Choice:  NA Choice offered to:  NA  DME Arranged:    DME Agency:     HH Arranged:    HH Agency:     Status of Service:  In process, will continue to follow  If discussed at Long Length of Stay Meetings, dates discussed:    Additional Comments:  Taino Maertens, Chauncey Reading, RN 12/24/2016, 11:22 AM

## 2016-12-24 NOTE — Consult Note (Signed)
CARDIOLOGY CONSULT NOTE   Patient ID: Dustin [name] MRN: [id number] DOB/AGE: 04-22-1934 82 y.o.  Admit Date: 12/23/2016 Referring Physician: TRH-Singh Primary Physician: Glo Herring., MD Consulting Cardiologist: Dorris Carnes  Primary Cardiologist: Rozann Lesches  Reason for Consultation: Chest Pain  Clinical Summary Dustin [name] is a 82 y.o.male with known history of coronary artery disease, hypertension, early dementia by history, most recent cardiac catheterization in August 2000 with patent LIMA to LAD, patent SVG to diagonal, patent SVG to OM1 and OM 2, disease of the SVG to PDA treated with drug-eluting stent 2. Distal disease was found to be not amenable to PCI and was treated medically. At that time his EF was 50-55%. He unfortunately was admitted to the hospital July 2015 with left sided weakness and chest pain and found have a TIA. The patient has Alzheimer's, with frequent episodes of confusion.  Dustin [name] presented to [facility] with complains of pain in his feet and darkening of his feet.  No sores.   Alos says over the past couple months he has had increased episodes of CP  Pain is L parasternal with radiation to shoulder  Sharp  Duration unclear  Not related to activity  Dustin [name] is not too active  WIll take NTG  Sometimes 2 to resolve  Has noted increased cough over the past several wks   Currently without discomfort   No Known Allergies  Medications Scheduled Medications: . amLODipine  10 mg Oral Daily  . [START ON 12/25/2016] aspirin EC  81 mg Oral Daily  . clopidogrel  75 mg Oral Daily  . donepezil  10 mg Oral QHS  . enoxaparin (LOVENOX) injection  40 mg Subcutaneous Q24H  . escitalopram  10 mg Oral Daily  . furosemide  20 mg Oral Daily  . gabapentin  300 mg Oral BID  . hydrochlorothiazide  12.5 mg Oral Daily  . levothyroxine  50 mcg Oral QAC breakfast  . memantine  10 mg Oral BID  . oxycodone  15 mg Oral TID  . [START ON 12/25/2016] potassium chloride SA  20 mEq Oral  Daily  . potassium chloride  40 mEq Oral Q6H  . pravastatin  40 mg Oral q1800  . tamsulosin  0.4 mg Oral Daily     Infusions:   PRN Medications:  acetaminophen, albuterol, ALPRAZolam, bisacodyl, nitroGLYCERIN, ondansetron (ZOFRAN) IV, polyethylene glycol, zolpidem   Past Medical History:  Diagnosis Date  . Alzheimer disease   . Anxiety   . Arthritis   . Atrophic gastritis    a. By EGD 02/2013.  [name] Coronary atherosclerosis of native coronary artery    a. Multivessel s/p CABG 1996. b. s/p DES x 2 SVG to PDA 8/12 with distal disease managed medically.  . DDD (degenerative disc disease)    Chronic back pain  . Enlarged prostate   . Essential hypertension, benign   . Hematuria   . Hypothyroidism   . MI (myocardial infarction)   . Mixed hyperlipidemia   . OA (osteoarthritis)   . OSA (obstructive sleep apnea)     Past Surgical History:  Procedure Laterality Date  . COLONOSCOPY  08/03/2004   Jenkins-numerous large diverticula in the descending, transverse, descending, and sigmoid colon. Otherwise normal exam.  . COLONOSCOPY  07/12/2012   RMR: External hemorrhoidal tag; multiple rectal and colonic polyps removed and/or treated as described above. Pancolonic diverticulosis. Bx-tubular adenomas, rectal hyperplastic polyp. next colonoscopy in 06/2015.  [name] COLONOSCOPY N/A 06/22/2016   Procedure: COLONOSCOPY;  Surgeon: Aviva Signs,  MD;  Location: [facility];  Service: Gastroenterology;  Laterality: N/A;  730  . CORONARY ANGIOPLASTY WITH STENT PLACEMENT    . CORONARY ARTERY BYPASS GRAFT  1996   LIMA to LAD, SVG to D2, SVG to PDA, SVG to OM1 and OM2  . ESOPHAGOGASTRODUODENOSCOPY N/A 03/16/2013   Procedure: ESOPHAGOGASTRODUODENOSCOPY (EGD);  Surgeon: Daneil Dolin, MD;  Location: [facility];  Service: Endoscopy;  Laterality: N/A;  12:00-moved to [place] notified Dustin [name]  . ESOPHAGOGASTRODUODENOSCOPY N/A 03/06/2013   Procedure: ESOPHAGOGASTRODUODENOSCOPY (EGD);  Surgeon: Daneil Dolin, MD;  Location: [facility];  Service: Endoscopy;  Laterality: N/A;  . HERNIA REPAIR      Family History  Problem Relation Age of Onset  . Heart disease    . Colon cancer Neg Hx    I1 thick green distress dominant test on him today not with all discussed up negativeSocial History Dustin [name] reports that he quit smoking about 22 years ago. His smoking use included Cigarettes. He has a 80.00 pack-year smoking history. He has never used smokeless tobacco. Dustin [name] reports that he does not drink alcohol.  Review of Systems Complete review of systems are found to be negative unless outlined in H&P above.  Physical Examination Blood pressure (!) 128/51, pulse (!) 42, temperature 97.5 F (36.4 C), temperature source Oral, resp. rate 18, height 5\' 9"  (1.753 m), weight 210 lb (95.3 kg), SpO2 97 %.  Intake/Output Summary (Last 24 hours) at 12/24/16 0928 Last data filed at 12/24/16 [id number]  Gross per 24 hour  Intake              340 ml  Output              300 ml  Net               40 ml    Telemetry:  GEN:  Dustin [name] is in NAD   HEENT: Conjunctiva and lids normal, oropharynx clear with moist mucosa. Neck: Supple, no elevated JVP or carotid bruits, no thyromegaly. Lungs: Clear to auscultation, nonlabored breathing at rest. Cardiac: Regular rate and rhythm, no S3 or significant systolic murmur, no pericardial rub. Abdomen: Soft, nontender, no hepatomegaly, bowel sounds present, no guarding or rebound. Extremities: Tr edema  Some chronic skin changes, distal pulses 2+. Skin: Warm and dry. Musculoskeletal: No kyphosis. Neuropsychiatric: Alert and oriented x3, affect grossly appropriate.  Prior Cardiac Testing/Procedures 1. Echocardiogram 06/28/2014 Left ventricle: Systolic function is low normal, EF 50%. The cavity size was mildly dilated. Wall thickness was increased in a pattern of mild LVH. There was an increased relative contribution of atrial contraction to ventricular  filling. Doppler parameters are consistent with abnormal left ventricular relaxation (grade 1 diastolic dysfunction). Doppler parameters are consistent with high ventricular filling pressure. - Aortic valve: Mildly calcified annulus. Trileaflet; mildly thickened leaflets. There was no stenosis. - Aorta: Borderline aortic root dilatation. 3.9 cm. - Mitral valve: There was moderate regurgitation. - Left atrium: The atrium was mildly dilated. - Right ventricle: Systolic function was mildly reduced. - Tricuspid valve: There was mild-moderate regurgitation. - Pulmonary arteries: PA peak pressure: 31 mm Hg (S).  NM Stress test: 06/29/2014 The SPECT stress and rest images demonstrate a fixed defect in the inferolateral wall consistent with scar/infarction. No reversible defects to suggest ischemia. The ejection fraction was calculated at 48%.  IMPRESSION: Inferolateral scar/infarction. No findings to suggest myocardial ischemia. Ejection fraction calculated at 48%.  Cardiac Cath 01/12/ 2006  :  CONCLUSION:  This study demonstrates severe native three vessel coronary artery disease  with patent saphenous vein graft to the PDA, patent saphenous vein graft to  the obtuse marginal, and patent saphenous vein graft to the diagonal  branches.  It also demonstrates patent but atretic LIMA to the LAD.  There  was a 90% PLSA stenosis down stream of the saphenous vein graft to the PDA  which could certainly be causing the patient's symptoms.  Overall, the  patient's LV function was preserved.  Lab Results  Basic Metabolic Panel:  Recent Labs Lab 12/23/16 2033 12/24/16 0144  NA 139 138  K 3.1* 3.2*  CL 97* 98*  CO2 36* 32  GLUCOSE 104* 105*  BUN 12 16  CREATININE 1.18 0.95  CALCIUM 9.2 9.0  MG  --  1.9    CBC:  Recent Labs Lab 12/23/16 2033  WBC 8.1  HGB 12.9*  HCT 39.2  MCV 94.0  PLT 216    Cardiac Enzymes:  Recent Labs Lab 12/23/16 2033 12/24/16 0144  12/24/16 0743  TROPONINI <0.03 <0.03 <0.03     Radiology: Dg Chest 2 View  Result Date: 12/23/2016 CLINICAL DATA:  Bilateral lower extremity pain worsening over the past year. Complains of generalized chest pain. Former smoker and CABG. EXAM: CHEST  2 VIEW COMPARISON:  08/22/2016 FINDINGS: The heart is within normal limits for size. There is aortic atherosclerosis at the arch. The patient is status post median sternotomy and post CABG change. Emphysematous hyperinflation of the lungs without pneumonic consolidation, effusion or pneumothorax. Minimal atelectasis at the lung bases. Osteoarthritis of the [facility] joints right worse than left. IMPRESSION: No active cardiopulmonary disease. Status post CABG with aortic atherosclerosis. Mild emphysematous hyperinflation of the lungs bilaterally. Electronically Signed   By: Ashley Royalty M.D.   On: 12/23/2016 19:04   [place] of LE 12/24/2016 Resting noninvasive study demonstrates no evidence of significant arterial occlusive disease.  Note that there is asymmetry of the measured upper extremity systolic pressure, higher on the right, which may indicate developing vascular disease. Repeat office based assessment may be useful, or if there is concern for upper extremity vascular disease, cross-sectional imaging may be considered.   ECG:  SInus bradycardia  55 bpm  ST depression with biphasic T waves II, III, F, V5 and V6   Present in previous EKG though more prominent now.     Impression and Recommendations  Dustin [name] is an 81 yo male with known signif CAD  Presents to [facility] with CP and leg discoloration.   CP is somewhat atypical but concerning  Dustin [name] with known dz and remote intervention    EKG with no new changes, though more prominent.   I would recomm a lexiscan myovue to eval for ischemia  Can keep on same meds  This can be done as outpt    Will arrange for this to be done as well as arrange for f/u in cardiology clinic   Myovue  would then be compared to  previous results from 2015  Would keep on same medicines for now.   2  CAD  As noted above  3.  Leg pain/dyscoloration.  ABIs near normal  SOme discoloration consistent with venous insuff.  Full neuro  eval not done  Defer to primary team    4  HTN  BP is not optimal  Would follow for now before adding more agentss  5.   HL  Keep on statin     Signed: Phill Myron.  Lawrence NP [place]  12/24/2016, 9:28 AM Co-Sign MD

## 2016-12-25 DIAGNOSIS — R0602 Shortness of breath: Secondary | ICD-10-CM | POA: Diagnosis not present

## 2016-12-25 DIAGNOSIS — Z87891 Personal history of nicotine dependence: Secondary | ICD-10-CM | POA: Diagnosis not present

## 2016-12-25 DIAGNOSIS — R110 Nausea: Secondary | ICD-10-CM | POA: Diagnosis not present

## 2016-12-25 DIAGNOSIS — I5032 Chronic diastolic (congestive) heart failure: Secondary | ICD-10-CM | POA: Diagnosis not present

## 2016-12-25 DIAGNOSIS — I10 Essential (primary) hypertension: Secondary | ICD-10-CM | POA: Diagnosis not present

## 2016-12-25 DIAGNOSIS — E039 Hypothyroidism, unspecified: Secondary | ICD-10-CM | POA: Diagnosis not present

## 2016-12-25 DIAGNOSIS — R0789 Other chest pain: Secondary | ICD-10-CM | POA: Diagnosis not present

## 2016-12-25 LAB — BASIC METABOLIC PANEL
Anion gap: 5 (ref 5–15)
BUN: 15 mg/dL (ref 6–20)
CHLORIDE: 100 mmol/L — AB (ref 101–111)
CO2: 33 mmol/L — AB (ref 22–32)
CREATININE: 0.82 mg/dL (ref 0.61–1.24)
Calcium: 8.7 mg/dL — ABNORMAL LOW (ref 8.9–10.3)
GFR calc Af Amer: >60 mL/min (ref >60)
GFR calc non Af Amer: >60 mL/min (ref >60)
GLUCOSE: 93 mg/dL (ref 65–99)
Potassium: 3.8 mmol/L (ref 3.5–5.1)
SODIUM: 138 mmol/L (ref 135–145)

## 2016-12-25 LAB — MAGNESIUM: Magnesium: 2.2 mg/dL (ref 1.7–2.4)

## 2016-12-25 MED ORDER — ALPRAZOLAM 1 MG PO TABS: 1.0000 mg | ORAL_TABLET | Freq: Two times a day (BID) | ORAL | 0 refills | Status: DC

## 2016-12-25 MED ORDER — GABAPENTIN 300 MG PO CAPS: 300.0000 mg | ORAL_CAPSULE | Freq: Three times a day (TID) | ORAL | 0 refills | Status: DC

## 2016-12-25 NOTE — Discharge Instructions (Signed)
Follow with Primary MD Glo Herring., MD in 7 days   Get CBC, CMP, 2 view Chest X ray checked  by Primary MD or SNF MD in 5-7 days ( we routinely change or add medications that can affect your baseline labs and fluid status, therefore we recommend that you get the mentioned basic workup next visit with your PCP, your PCP may decide not to get them or add new tests based on their clinical decision)   Activity: As tolerated with Full fall precautions use walker/cane & assistance as needed   Disposition Home     Diet:  Heart Healthy   For Heart failure patients - Check your Weight same time everyday, if you gain over 2 pounds, or you develop in leg swelling, experience more shortness of breath or chest pain, call your Primary MD immediately. Follow Cardiac Low Salt Diet and 1.5 lit/day fluid restriction.   On your next visit with your primary care physician please Get Medicines reviewed and adjusted.   Please request your Prim.MD to go over all Hospital Tests and Procedure/Radiological results at the follow up, please get all Hospital records sent to your Prim MD by signing hospital release before you go home.   If you experience worsening of your admission symptoms, develop shortness of breath, life threatening emergency, suicidal or homicidal thoughts you must seek medical attention immediately by calling 911 or calling your MD immediately  if symptoms less severe.  You Must read complete instructions/literature along with all the possible adverse reactions/side effects for all the Medicines you take and that have been prescribed to you. Take any new Medicines after you have completely understood and accpet all the possible adverse reactions/side effects.   Do not drive, operate heavy machinery, perform activities at heights, swimming or participation in water activities or provide baby sitting services if your were admitted for syncope or siezures until you have seen by Primary MD or a  Neurologist and advised to do so again.  Do not drive when taking Pain medications.    Do not take more than prescribed Pain, Sleep and Anxiety Medications  Special Instructions: If you have smoked or chewed Tobacco  in the last 2 yrs please stop smoking, stop any regular Alcohol  and or any Recreational drug use.  Wear Seat belts while driving.   Please note  You were cared for by a hospitalist during your hospital stay. If you have any questions about your discharge medications or the care you received while you were in the hospital after you are discharged, you can call the unit and asked to speak with the hospitalist on call if the hospitalist that took care of you is not available. Once you are discharged, your primary care physician will handle any further medical issues. Please note that NO REFILLS for any discharge medications will be authorized once you are discharged, as it is imperative that you return to your primary care physician (or establish a relationship with a primary care physician if you do not have one) for your aftercare needs so that they can reassess your need for medications and monitor your lab values.

## 2016-12-25 NOTE — Discharge Summary (Signed)
Dustin [name] [id number] DOB: 23-Sep-1934 DOA: 12/23/2016  PCP: Glo Herring., MD  Admit date: 12/23/2016  Discharge date: 12/25/2016  Admitted From: Home   Disposition:  Home   Recommendations for Outpatient Follow-up:   Follow up with PCP in 1-2 weeks  PCP Please obtain BMP/CBC, 2 view CXR in 1week,  (see Discharge instructions)   PCP Please follow up on the following pending results: None   Home Health: None   Equipment/Devices: None  Consultations: Cards Discharge Condition: Fair   CODE STATUS: Full   Diet Recommendation:  Heart Healthy    Chief Complaint  Patient presents with  . Foot Pain  . Chest Pain     Brief history of present illness from the day of admission and additional interim summary    Dustin [name] a 82 y.o.malewith medical history significant forcoronary artery disease status post CABG in 1996, hypertension, hypothyroidism, and obstructive sleep apnea who presents to the emergency department for evaluation of pain in the bilateral feet, and intermittent chest pain. Patient reports several months of progressive darkening and pain in the bilateral feet, and also notes intermittent chest pain for the past several weeks.  Hospital issues addressed     1. Intermittent chest pain. He sounded atypical, history of CAD and CABG in the past. He ruled out for MI, EKG has nonspecific changes, he is currently chest pain-free, currently on Plavix, no beta blocker due to low heart rate, continue statin. Cardiology saw the patient and recommend outpatient stress test , he is now chest pain free.  2. Sinus bradycardia. DC beta blocker , repeat TSH as below.  3. Hypertension. Currently on Norvasc and HCTZ. Stable.  4. Bilateral lower extremity foot pain which is chronic but  gradually worsening over the last several years. Stable ABI, ? Neuropathic, Neurontin increased. Follow with PCP and  Continue Plavix and statin for any minimal PAD.  5. Dementia. At risk for delirium. Continue Aricept, minimize benzos and narcotics.  6. Hypothyroidism.Continue home dose Synthroid for now. TSH was 5.4 repeat by PCP in 4-6 weeks.  7. Dyslipidemia. On statin continue.  8. Chronic right knee pain. Stable x-ray, chronic OA likely.  9. Chronic diastolic CHF last EF [id number]. Compensated.   Discharge diagnosis     Principal Problem:   Chest pain Active Problems:   OSA (obstructive sleep apnea)   HYPERTENSION, BENIGN   Coronary artery disease   Hypothyroidism   Dementia   Hypokalemia   Sinus bradycardia   Foot pain, bilateral   Chronic diastolic CHF (congestive heart failure) (HCC)    Discharge instructions    Discharge Instructions    Diet - low sodium heart healthy    Complete by:  As directed    Discharge instructions    Complete by:  As directed    Follow with Primary MD Glo Herring., MD in 7 days   Get CBC, CMP, 2 view Chest X ray checked  by Primary MD or SNF MD in 5-7 days ( we routinely change or  add medications that can affect your baseline labs and fluid status, therefore we recommend that you get the mentioned basic workup next visit with your PCP, your PCP may decide not to get them or add new tests based on their clinical decision)   Activity: As tolerated with Full fall precautions use walker/cane & assistance as needed   Disposition Home     Diet:  Heart Healthy   For Heart failure patients - Check your Weight same time everyday, if you gain over 2 pounds, or you develop in leg swelling, experience more shortness of breath or chest pain, call your Primary MD immediately. Follow Cardiac Low Salt Diet and 1.5 lit/day fluid restriction.   On your next visit with your primary care physician please Get Medicines reviewed and  adjusted.   Please request your Prim.MD to go over all Hospital Tests and Procedure/Radiological results at the follow up, please get all Hospital records sent to your Prim MD by signing hospital release before you go home.   If you experience worsening of your admission symptoms, develop shortness of breath, life threatening emergency, suicidal or homicidal thoughts you must seek medical attention immediately by calling 911 or calling your MD immediately  if symptoms less severe.  You Must read complete instructions/literature along with all the possible adverse reactions/side effects for all the Medicines you take and that have been prescribed to you. Take any new Medicines after you have completely understood and accpet all the possible adverse reactions/side effects.   Do not drive, operate heavy machinery, perform activities at heights, swimming or participation in water activities or provide baby sitting services if your were admitted for syncope or siezures until you have seen by Primary MD or a Neurologist and advised to do so again.  Do not drive when taking Pain medications.    Do not take more than prescribed Pain, Sleep and Anxiety Medications  Special Instructions: If you have smoked or chewed Tobacco  in the last 2 yrs please stop smoking, stop any regular Alcohol  and or any Recreational drug use.  Wear Seat belts while driving.   Please note  You were cared for by a hospitalist during your hospital stay. If you have any questions about your discharge medications or the care you received while you were in the hospital after you are discharged, you can call the unit and asked to speak with the hospitalist on call if the hospitalist that took care of you is not available. Once you are discharged, your primary care physician will handle any further medical issues. Please note that NO REFILLS for any discharge medications will be authorized once you are discharged, as it is  imperative that you return to your primary care physician (or establish a relationship with a primary care physician if you do not have one) for your aftercare needs so that they can reassess your need for medications and monitor your lab values.   Increase activity slowly    Complete by:  As directed       Discharge Medications   Allergies as of 12/25/2016   No Known Allergies     Medication List    STOP taking these medications   carvedilol 3.125 MG tablet Commonly known as:  COREG     TAKE these medications   albuterol 108 (90 Base) MCG/ACT inhaler Commonly known as:  PROVENTIL HFA;VENTOLIN HFA Inhale 2 puffs into the lungs every 6 (six) hours as needed for wheezing or shortness of breath.  ALPRAZolam 1 MG tablet Commonly known as:  XANAX Take 1 tablet (1 mg total) by mouth 2 (two) times daily. For anxiety/sleep What changed:  when to take this  reasons to take this   amLODipine 10 MG tablet Commonly known as:  NORVASC Take 1 tablet by mouth daily.   bisacodyl 5 MG EC tablet Generic drug:  bisacodyl Take 35 mg by mouth 2 (two) times a week.   clopidogrel 75 MG tablet Commonly known as:  PLAVIX Take 1 tablet (75 mg total) by mouth daily.   donepezil 10 MG tablet Commonly known as:  ARICEPT Take 1 tablet by mouth At bedtime.   escitalopram 10 MG tablet Commonly known as:  LEXAPRO Take 10 mg by mouth daily.   furosemide 20 MG tablet Commonly known as:  LASIX Take 20 mg by mouth daily.   gabapentin 300 MG capsule Commonly known as:  NEURONTIN Take 1 capsule (300 mg total) by mouth 3 (three) times daily. What changed:  when to take this   hydrochlorothiazide 12.5 MG capsule Commonly known as:  MICROZIDE Take 1 capsule by mouth daily.   levothyroxine 50 MCG tablet Commonly known as:  SYNTHROID, LEVOTHROID Take 50 mcg by mouth daily.   memantine 10 MG tablet Commonly known as:  NAMENDA Take 1 tablet by mouth 2 (two) times daily.   nitroGLYCERIN  0.4 MG SL tablet Commonly known as:  NITROSTAT PLACE ONE TABLET UNDER TONGUE EVERY 5 MIN UP TO 3 DOSES AS NEEDED FORCHEST PAIN.   oxycodone 30 MG immediate release tablet Commonly known as:  ROXICODONE Take 15 mg by mouth 3 (three) times daily.   potassium chloride SA 20 MEQ tablet Commonly known as:  K-DUR,KLOR-CON Take 20 mEq by mouth daily.   pravastatin 40 MG tablet Commonly known as:  PRAVACHOL TAKE (1) TABLET BY MOUTH AT BEDTIME.   tamsulosin 0.4 MG Caps capsule Commonly known as:  FLOMAX Take 1 capsule by mouth daily.       Follow-up Information    Dorris Carnes, MD Follow up.   Specialty:  Cardiology Contact information: [address] [place] [address] [phone]        Follow up Follow up on 12/28/2016.   Why:  Stress test Register at 8:45 am at [facility] Radiology [facility]. Nothing to eat after midnight.       Glo Herring., MD. Schedule an appointment as soon as possible for a visit in 1 week(s).   Specialty:  Internal Medicine Contact information: [address] [place] [id number] [phone]           Major procedures and Radiology Reports - PLEASE review detailed and final reports thoroughly  -         Dg Chest 2 View  Result Date: 12/23/2016 CLINICAL DATA:  Bilateral lower extremity pain worsening over the past year. Complains of generalized chest pain. Former smoker and CABG. EXAM: CHEST  2 VIEW COMPARISON:  08/22/2016 FINDINGS: The heart is within normal limits for size. There is aortic atherosclerosis at the arch. The patient is status post median sternotomy and post CABG change. Emphysematous hyperinflation of the lungs without pneumonic consolidation, effusion or pneumothorax. Minimal atelectasis at the lung bases. Osteoarthritis of the [facility] joints right worse than left. IMPRESSION: No active cardiopulmonary disease. Status post CABG with aortic atherosclerosis. Mild emphysematous hyperinflation of the lungs  bilaterally. Electronically Signed   By: Ashley Royalty M.D.   On: 12/23/2016 19:04   US Arterial Seg  Single  Result Date: 12/24/2016 CLINICAL DATA:  82 year old male with a history of claudication. Cardiovascular risk factors include smoking, hypertension, known vascular surgery EXAM: NONINVASIVE PHYSIOLOGIC VASCULAR STUDY OF BILATERAL LOWER EXTREMITIES TECHNIQUE: Evaluation of both lower extremities was performed at rest, including calculation of ankle-brachial indices, multiple segmental pressure evaluation, segmental Doppler and segmental pulse volume recording. COMPARISON:  None. FINDINGS: Right: Resting ankle brachial index:  1.09 Segmental blood pressure: Measured systolic pressure on the right greater than 15 mm of mercury higher than the left. Doppler: Segmental Doppler of the distal right leg demonstrates triphasic posterior tibial and dorsalis pedis. Left: Resting ankle brachial index: 1.10 Segmental blood pressure: Measured systolic pressure on the left 15 mm of mercury lower than the right. Doppler: Distal Doppler of the left lower extremity demonstrates triphasic posterior tibial and dorsalis pedis. Additional: IMPRESSION: Resting noninvasive study demonstrates no evidence of significant arterial occlusive disease. Note that there is asymmetry of the measured upper extremity systolic pressure, higher on the right, which may indicate developing vascular disease. Repeat office based assessment may be useful, or if there is concern for upper extremity vascular disease, cross-sectional imaging may be considered. Signed, Dulcy Fanny. Earleen Newport, DO [facility] Electronically Signed   By: Corrie Mckusick D.O.   On: 12/24/2016 10:40   Dg Knee Complete 4 Views Left  Result Date: 12/24/2016 CLINICAL DATA:  Chronic bilateral knee pain worsening over the past 3 weeks. EXAM: LEFT KNEE - COMPLETE 4+ VIEW; RIGHT KNEE - COMPLETE 4+ VIEW COMPARISON:  None.  FINDINGS: Moderate at tricompartmental degenerative changes most significant in the medial compartments with joint space narrowing and osteophytic spurring. There are also changes of chondrocalcinosis more noticeable of the left knee. No acute bony findings or osteochondral lesions. Vascular calcifications are noted. No significant joint effusions. IMPRESSION: Bilateral tricompartmental degenerative changes and chondrocalcinosis but no acute bony findings or significant joint effusion. Electronically Signed   By: Marijo Sanes M.D.   On: 12/24/2016 10:52   Dg Knee Complete 4 Views Right  Result Date: 12/24/2016 CLINICAL DATA:  Chronic bilateral knee pain worsening over the past 3 weeks. EXAM: LEFT KNEE - COMPLETE 4+ VIEW; RIGHT KNEE - COMPLETE 4+ VIEW COMPARISON:  None. FINDINGS: Moderate at tricompartmental degenerative changes most significant in the medial compartments with joint space narrowing and osteophytic spurring. There are also changes of chondrocalcinosis more noticeable of the left knee. No acute bony findings or osteochondral lesions. Vascular calcifications are noted. No significant joint effusions. IMPRESSION: Bilateral tricompartmental degenerative changes and chondrocalcinosis but no acute bony findings or significant joint effusion. Electronically Signed   By: Marijo Sanes M.D.   On: 12/24/2016 10:52    Micro Results     No results found for this or any previous visit (from the past 240 hour(s)).  Today   Subjective    Xaiden Learn today has no headache,no chest abdominal pain,no new weakness tingling or numbness, feels much better wants to go home today.     Objective   Blood pressure (!) 119/47, pulse (!) 49, temperature 97.5 F (36.4 C), temperature source Oral, resp. rate 18, height 5\' 9"  (1.753 m), weight 90.1 kg (198 lb 10.2 oz), SpO2 96 %.   Intake/Output Summary (Last 24 hours) at 12/25/16 0954 Last data filed at 12/24/16 2345  Gross per 24 hour  Intake               240 ml  Output  650 ml  Net             -410 ml    Exam Awake Alert, Oriented x 3, No new F.N deficits, Normal affect [place].AT,PERRAL Supple Neck,No JVD, No cervical lymphadenopathy appriciated.  Symmetrical Chest wall movement, Good air movement bilaterally, CTAB RRR,No Gallops,Rubs or new Murmurs, No Parasternal Heave +ve B.Sounds, Abd Soft, Non tender, No organomegaly appriciated, No rebound -guarding or rigidity. No Cyanosis, Clubbing or edema, No new Rash or bruise   Data Review   CBC w Diff:  Lab Results  Component Value Date   WBC 8.1 12/23/2016   HGB 12.9 (L) 12/23/2016   HCT 39.2 12/23/2016   HCT 43 01/26/2013   PLT 216 12/23/2016   LYMPHOPCT 15 04/03/2016   MONOPCT 5 04/03/2016   EOSPCT 2 04/03/2016   BASOPCT 0 04/03/2016    CMP:  Lab Results  Component Value Date   NA 138 12/25/2016   NA 139 01/26/2013   K 3.8 12/25/2016   K 3.9 01/26/2013   CL 100 (L) 12/25/2016   CL 100 01/26/2013   CO2 33 (H) 12/25/2016   CO2 31 01/26/2013   BUN 15 12/25/2016   BUN 14 01/26/2013   CREATININE 0.82 12/25/2016   CREATININE 0.80 01/26/2013   PROT 8.4 (H) 01/26/2015   PROT 6.7 01/26/2013   ALBUMIN 4.8 01/26/2015   ALBUMIN 4.6 01/26/2013   BILITOT 1.1 01/26/2015   BILITOT 0.8 01/26/2013   ALKPHOS 61 01/26/2015   ALKPHOS 70 01/26/2013   AST 22 01/26/2015   AST 16 01/26/2013   ALT 19 01/26/2015  .   Total Time in preparing paper work, data evaluation and todays exam - 35 minutes  Thurnell Lose M.D on 12/25/2016 at 9:54 AM  [name]   Office  [phone]

## 2016-12-28 ENCOUNTER — Inpatient Hospital Stay (HOSPITAL_COMMUNITY): Admit: 2016-12-28 | Payer: PPO

## 2016-12-28 ENCOUNTER — Encounter (HOSPITAL_COMMUNITY)
Admit: 2016-12-28 | Discharge: 2016-12-28 | Disposition: A | Discharge status: Home / Self Care | Payer: PPO | Source: Ambulatory Visit | Attending: Internal Medicine | Admitting: Internal Medicine

## 2016-12-28 ENCOUNTER — Encounter (HOSPITAL_COMMUNITY): Payer: Self-pay

## 2016-12-28 DIAGNOSIS — R9439 Abnormal result of other cardiovascular function study: Secondary | ICD-10-CM | POA: Diagnosis not present

## 2016-12-28 DIAGNOSIS — R072 Precordial pain: Secondary | ICD-10-CM | POA: Diagnosis not present

## 2016-12-28 LAB — NM MYOCAR MULTI W/SPECT W/WALL MOTION / EF
CHL CUP NUCLEAR SDS: 4
CHL CUP NUCLEAR SRS: 5
CSEPPHR: 49 {beats}/min
LHR: 0.53
LV dias vol: 134 mL (ref 62–150)
LV sys vol: 72 mL
Rest HR: 43 {beats}/min
SSS: 9
TID: 1.21

## 2016-12-28 MED ORDER — TECHNETIUM TC 99M TETROFOSMIN IV KIT
30.0000 | PACK | Freq: Once | INTRAVENOUS | Status: AC | PRN
  Administered 2016-12-28: 30 via INTRAVENOUS

## 2016-12-28 MED ORDER — SODIUM CHLORIDE 0.9% FLUSH
INTRAVENOUS | Status: AC
  Administered 2016-12-28: 10 mL via INTRAVENOUS
  Filled 2016-12-28: qty 10

## 2016-12-28 MED ORDER — TECHNETIUM TC 99M TETROFOSMIN IV KIT
10.0000 | PACK | Freq: Once | INTRAVENOUS | Status: AC | PRN
  Administered 2016-12-28: 9.7 via INTRAVENOUS

## 2016-12-28 MED ORDER — REGADENOSON 0.4 MG/5ML IV SOLN
INTRAVENOUS | Status: AC
  Administered 2016-12-28: 0.4 mg via INTRAVENOUS
  Filled 2016-12-28: qty 5

## 2016-12-31 DIAGNOSIS — Z6829 Body mass index (BMI) 29.0-29.9, adult: Secondary | ICD-10-CM | POA: Diagnosis not present

## 2016-12-31 DIAGNOSIS — I2510 Atherosclerotic heart disease of native coronary artery without angina pectoris: Secondary | ICD-10-CM | POA: Diagnosis not present

## 2016-12-31 DIAGNOSIS — M549 Dorsalgia, unspecified: Secondary | ICD-10-CM | POA: Diagnosis not present

## 2016-12-31 DIAGNOSIS — I200 Unstable angina: Secondary | ICD-10-CM | POA: Diagnosis not present

## 2016-12-31 DIAGNOSIS — M48061 Spinal stenosis, lumbar region without neurogenic claudication: Secondary | ICD-10-CM | POA: Diagnosis not present

## 2016-12-31 DIAGNOSIS — Z1389 Encounter for screening for other disorder: Secondary | ICD-10-CM | POA: Diagnosis not present

## 2016-12-31 DIAGNOSIS — R079 Chest pain, unspecified: Secondary | ICD-10-CM | POA: Diagnosis not present

## 2016-12-31 DIAGNOSIS — I5032 Chronic diastolic (congestive) heart failure: Secondary | ICD-10-CM | POA: Diagnosis not present

## 2016-12-31 DIAGNOSIS — E663 Overweight: Secondary | ICD-10-CM | POA: Diagnosis not present

## 2017-01-03 ENCOUNTER — Telehealth: Payer: Self-pay | Admitting: *Deleted

## 2017-01-03 NOTE — Telephone Encounter (Signed)
Notes Recorded by Rodman Key, RN on 01/03/2017 at 4:39 PM EST Reviewed with patient and his wife. Pt is not having chest pain now. Saw PCP and was started on isosorbide 30 mg once daily. They are aware someone will call to schedule him to see Dr. Harrington Challenger on Feb 2 in [place]. Staff message sent to [employer].  Isosorbide mononitrate 30 mg daily added to pt's medication list.

## 2017-01-03 NOTE — Telephone Encounter (Signed)
-----   Message from Fay Records, MD sent at 12/28/2016  8:24 PM EST ----- Stress test shows evid of scar/soft tissue shadowing and minimal ischemia I would recomm continuing on medical therapy  [place] therapy  Please arrange for f/u in clinic in a couple wks to see how he is doing

## 2017-01-04 ENCOUNTER — Encounter: Payer: Self-pay | Admitting: Cardiology

## 2017-01-04 ENCOUNTER — Ambulatory Visit: Payer: PPO | Admitting: Cardiology

## 2017-01-04 NOTE — Progress Notes (Deleted)
Cardiology Office Note  Date: 01/04/2017   ID: Dustin [name], DOB 1934/10/06, MRN [id number]  PCP: Glo Herring., MD  Primary Cardiologist: Rozann Lesches, MD   No chief complaint on file.   History of Present Illness: Dustin [name] is an 83 y.o. male not seen in the office since May 2015. More recently in December 2017 he was seen as an inpatient consult by Dr. Harrington Challenger with chest pain. He ruled out for ACS with troponin I levels normal 4 and was referred for an outpatient Myoview. Results are outlined below.  Past Medical History:  Diagnosis Date  . Alzheimer disease   . Anxiety   . Arthritis   . Atrophic gastritis    a. By EGD 02/2013.  [name] Coronary atherosclerosis of native coronary artery    a. Multivessel s/p CABG 1996. b. s/p DES x 2 SVG to PDA 8/12 with distal disease managed medically.  . DDD (degenerative disc disease)    Chronic back pain  . Enlarged prostate   . Essential hypertension, benign   . Hematuria   . Hypothyroidism   . MI (myocardial infarction)   . Mixed hyperlipidemia   . OA (osteoarthritis)   . OSA (obstructive sleep apnea)     Past Surgical History:  Procedure Laterality Date  . COLONOSCOPY  08/03/2004   Jenkins-numerous large diverticula in the descending, transverse, descending, and sigmoid colon. Otherwise normal exam.  . COLONOSCOPY  07/12/2012   RMR: External hemorrhoidal tag; multiple rectal and colonic polyps removed and/or treated as described above. Pancolonic diverticulosis. Bx-tubular adenomas, rectal hyperplastic polyp. next colonoscopy in 06/2015.  [name] COLONOSCOPY N/A 06/22/2016   Procedure: COLONOSCOPY;  Surgeon: Aviva Signs, MD;  [facility]: [facility];  Service: Gastroenterology;  Laterality: N/A;  730  . CORONARY ANGIOPLASTY WITH STENT PLACEMENT    . CORONARY ARTERY BYPASS GRAFT  1996   LIMA to LAD, SVG to D2, SVG to PDA, SVG to OM1 and OM2  . ESOPHAGOGASTRODUODENOSCOPY N/A 03/16/2013   Procedure: ESOPHAGOGASTRODUODENOSCOPY  (EGD);  Surgeon: Daneil Dolin, MD;  Location: [facility];  Service: Endoscopy;  Laterality: N/A;  12:00-moved to [place] notified pt  . ESOPHAGOGASTRODUODENOSCOPY N/A 03/06/2013   Procedure: ESOPHAGOGASTRODUODENOSCOPY (EGD);  Surgeon: Daneil Dolin, MD;  Location: [facility];  Service: Endoscopy;  Laterality: N/A;  . HERNIA REPAIR      Current Outpatient Prescriptions  Medication Sig Dispense Refill  . albuterol (PROVENTIL HFA;VENTOLIN HFA) 108 (90 Base) MCG/ACT inhaler Inhale 2 puffs into the lungs every 6 (six) hours as needed for wheezing or shortness of breath. 1 Inhaler 2  . ALPRAZolam (XANAX) 1 MG tablet Take 1 tablet (1 mg total) by mouth 2 (two) times daily. For anxiety/sleep 30 tablet 0  . amLODipine (NORVASC) 10 MG tablet Take 1 tablet by mouth daily.    . bisacodyl (BISACODYL) 5 MG EC tablet Take 35 mg by mouth 2 (two) times a week.    . clopidogrel (PLAVIX) 75 MG tablet Take 1 tablet (75 mg total) by mouth daily.    [name] donepezil (ARICEPT) 10 MG tablet Take 1 tablet by mouth At bedtime.    [name] escitalopram (LEXAPRO) 10 MG tablet Take 10 mg by mouth daily.    . furosemide (LASIX) 20 MG tablet Take 20 mg by mouth daily.     [name] gabapentin (NEURONTIN) 300 MG capsule Take 1 capsule (300 mg total) by mouth 3 (three) times daily. 90 capsule 0  . hydrochlorothiazide (MICROZIDE) 12.5  MG capsule Take 1 capsule by mouth daily.    . isosorbide mononitrate (IMDUR) 30 MG 24 hr tablet Take 30 mg by mouth daily.    [name] levothyroxine (SYNTHROID, LEVOTHROID) 50 MCG tablet Take 50 mcg by mouth daily.    . memantine (NAMENDA) 10 MG tablet Take 1 tablet by mouth 2 (two) times daily.    . nitroGLYCERIN (NITROSTAT) 0.4 MG SL tablet PLACE ONE TABLET UNDER TONGUE EVERY 5 MIN UP TO 3 DOSES AS NEEDED FORCHEST PAIN. 25 tablet 3  . oxycodone (ROXICODONE) 30 MG immediate release tablet Take 15 mg by mouth 3 (three) times daily.    . potassium chloride SA (K-DUR,KLOR-CON) 20 MEQ tablet Take 20 mEq by  mouth daily.    . pravastatin (PRAVACHOL) 40 MG tablet TAKE (1) TABLET BY MOUTH AT BEDTIME. 15 tablet 0  . tamsulosin (FLOMAX) 0.4 MG CAPS capsule Take 1 capsule by mouth daily.     No current facility-administered medications for this visit.    Allergies:  Patient has no known allergies.   Social History: The patient  reports that he quit smoking about 22 years ago. His smoking use included Cigarettes. He has a 80.00 pack-year smoking history. He has never used smokeless tobacco. He reports that he does not drink alcohol or use drugs.   Family History: The patient's family history is not on file.   ROS:  Please see the history of present illness. Otherwise, complete review of systems is positive for {NONE DEFAULTED:18576::"none"}.  All other systems are reviewed and negative.   Physical Exam: VS:  There were no vitals taken for this visit., BMI There is no height or weight on file to calculate BMI.  Wt Readings from Last 3 Encounters:  12/25/16 198 lb 10.2 oz (90.1 kg)  04/06/16 206 lb 11.2 oz (93.8 kg)  04/14/15 188 lb (85.3 kg)    General: Patient appears comfortable at rest. HEENT: Conjunctiva and lids normal, oropharynx clear with moist mucosa. Neck: Supple, no elevated JVP or carotid bruits, no thyromegaly. Lungs: Clear to auscultation, nonlabored breathing at rest. Cardiac: Regular rate and rhythm, no S3 or significant systolic murmur, no pericardial rub. Abdomen: Soft, nontender, no hepatomegaly, bowel sounds present, no guarding or rebound. Extremities: No pitting edema, distal pulses 2+. Skin: Warm and dry. Musculoskeletal: No kyphosis. Neuropsychiatric: Alert and oriented x3, affect grossly appropriate.  ECG: I personally reviewed the tracing from 12/23/2016 which showed sinus bradycardia with IVCD and nonspecific ST-T wave abnormalities.  Recent Labwork: 04/03/2016: B Natriuretic Peptide 173.0 12/23/2016: Hemoglobin 12.9; Platelets 216 12/24/2016: TSH  5.439 12/25/2016: BUN 15; Creatinine, Ser 0.82; Magnesium 2.2; Potassium 3.8; Sodium 138     Component Value Date/Time   CHOL 127 06/29/2014 0308   TRIG 143 06/29/2014 0308   HDL 40 06/29/2014 0308   CHOLHDL 3.2 06/29/2014 0308   VLDL 29 06/29/2014 0308   LDLCALC 58 06/29/2014 0308    Other Studies Reviewed Today:  Lexiscan Myoview 12/28/2016:  Inferior defect that shows partial improvement consistent with scar/soft tissue attenuation and periinfarct ischemia  LVEF 46%  Intermediate risk study  Arterial Dopplers 12/24/2016: IMPRESSION: Resting noninvasive study demonstrates no evidence of significant arterial occlusive disease.  Note that there is asymmetry of the measured upper extremity systolic pressure, higher on the right, which may indicate developing vascular disease. Repeat office based assessment may be useful, or if there is concern for upper extremity vascular disease, cross-sectional imaging may be considered.  Echocardiogram 06/28/2014: Study Conclusions  - Left ventricle:  Systolic function is low normal, EF 50%. The cavity size was mildly dilated. Wall thickness was increased in a pattern of mild LVH. There was an increased relative contribution of atrial contraction to ventricular filling. Doppler parameters are consistent with abnormal left ventricular relaxation (grade 1 diastolic dysfunction). Doppler parameters are consistent with high ventricular filling pressure. - Aortic valve: Mildly calcified annulus. Trileaflet; mildly thickened leaflets. There was no stenosis. - Aorta: Borderline aortic root dilatation. 3.9 cm. - Mitral valve: There was moderate regurgitation. - Left atrium: The atrium was mildly dilated. - Right ventricle: Systolic function was mildly reduced. - Tricuspid valve: There was mild-moderate regurgitation. - Pulmonary arteries: PA peak pressure: 31 mm Hg (S).  Assessment and Plan:   Current medicines were reviewed  with the patient today.  No orders of the defined types were placed in this encounter.   Disposition:  Signed, Satira Sark, MD, [facility] 01/04/2017 11:54 AM    [place] [facility] [place]. [address] [place], [place] [address] Phone: [phone]; Fax: [phone]

## 2017-01-07 ENCOUNTER — Other Ambulatory Visit: Payer: Self-pay | Admitting: Adult Health

## 2017-01-07 DIAGNOSIS — J9620 Acute and chronic respiratory failure, unspecified whether with hypoxia or hypercapnia: Secondary | ICD-10-CM | POA: Diagnosis not present

## 2017-02-02 DIAGNOSIS — Z6828 Body mass index (BMI) 28.0-28.9, adult: Secondary | ICD-10-CM | POA: Diagnosis not present

## 2017-02-02 DIAGNOSIS — Z1389 Encounter for screening for other disorder: Secondary | ICD-10-CM | POA: Diagnosis not present

## 2017-02-02 DIAGNOSIS — I10 Essential (primary) hypertension: Secondary | ICD-10-CM | POA: Diagnosis not present

## 2017-02-02 DIAGNOSIS — E785 Hyperlipidemia, unspecified: Secondary | ICD-10-CM | POA: Diagnosis not present

## 2017-02-02 DIAGNOSIS — Z0001 Encounter for general adult medical examination with abnormal findings: Secondary | ICD-10-CM | POA: Diagnosis not present

## 2017-02-02 DIAGNOSIS — F1120 Opioid dependence, uncomplicated: Secondary | ICD-10-CM | POA: Diagnosis not present

## 2017-02-02 DIAGNOSIS — R7309 Other abnormal glucose: Secondary | ICD-10-CM | POA: Diagnosis not present

## 2017-02-02 DIAGNOSIS — E663 Overweight: Secondary | ICD-10-CM | POA: Diagnosis not present

## 2017-02-02 DIAGNOSIS — I2510 Atherosclerotic heart disease of native coronary artery without angina pectoris: Secondary | ICD-10-CM | POA: Diagnosis not present

## 2017-02-02 DIAGNOSIS — E039 Hypothyroidism, unspecified: Secondary | ICD-10-CM | POA: Diagnosis not present

## 2017-02-07 DIAGNOSIS — J9620 Acute and chronic respiratory failure, unspecified whether with hypoxia or hypercapnia: Secondary | ICD-10-CM | POA: Diagnosis not present

## 2017-02-12 ENCOUNTER — Observation Stay (HOSPITAL_COMMUNITY)
Admission: EM | Admit: 2017-02-12 | Discharge: 2017-02-15 | Disposition: A | Discharge status: Home / Self Care | Payer: PPO | Attending: Internal Medicine | Admitting: Internal Medicine

## 2017-02-12 ENCOUNTER — Emergency Department (HOSPITAL_COMMUNITY): Payer: PPO

## 2017-02-12 ENCOUNTER — Observation Stay (HOSPITAL_COMMUNITY): Payer: PPO

## 2017-02-12 ENCOUNTER — Encounter (HOSPITAL_COMMUNITY): Payer: Self-pay | Admitting: Emergency Medicine

## 2017-02-12 DIAGNOSIS — Z955 Presence of coronary angioplasty implant and graft: Secondary | ICD-10-CM | POA: Insufficient documentation

## 2017-02-12 DIAGNOSIS — Z7902 Long term (current) use of antithrombotics/antiplatelets: Secondary | ICD-10-CM | POA: Diagnosis not present

## 2017-02-12 DIAGNOSIS — D649 Anemia, unspecified: Secondary | ICD-10-CM | POA: Diagnosis not present

## 2017-02-12 DIAGNOSIS — N400 Enlarged prostate without lower urinary tract symptoms: Secondary | ICD-10-CM | POA: Insufficient documentation

## 2017-02-12 DIAGNOSIS — I5032 Chronic diastolic (congestive) heart failure: Secondary | ICD-10-CM | POA: Diagnosis not present

## 2017-02-12 DIAGNOSIS — I10 Essential (primary) hypertension: Secondary | ICD-10-CM | POA: Diagnosis present

## 2017-02-12 DIAGNOSIS — I110 Hypertensive heart disease with heart failure: Secondary | ICD-10-CM | POA: Insufficient documentation

## 2017-02-12 DIAGNOSIS — F0150 Vascular dementia without behavioral disturbance: Secondary | ICD-10-CM

## 2017-02-12 DIAGNOSIS — R001 Bradycardia, unspecified: Secondary | ICD-10-CM | POA: Diagnosis not present

## 2017-02-12 DIAGNOSIS — R079 Chest pain, unspecified: Secondary | ICD-10-CM

## 2017-02-12 DIAGNOSIS — I252 Old myocardial infarction: Secondary | ICD-10-CM | POA: Insufficient documentation

## 2017-02-12 DIAGNOSIS — M1990 Unspecified osteoarthritis, unspecified site: Secondary | ICD-10-CM | POA: Diagnosis not present

## 2017-02-12 DIAGNOSIS — G309 Alzheimer's disease, unspecified: Secondary | ICD-10-CM | POA: Insufficient documentation

## 2017-02-12 DIAGNOSIS — Z87891 Personal history of nicotine dependence: Secondary | ICD-10-CM | POA: Diagnosis not present

## 2017-02-12 DIAGNOSIS — R0789 Other chest pain: Secondary | ICD-10-CM | POA: Diagnosis not present

## 2017-02-12 DIAGNOSIS — G894 Chronic pain syndrome: Secondary | ICD-10-CM | POA: Diagnosis not present

## 2017-02-12 DIAGNOSIS — I2510 Atherosclerotic heart disease of native coronary artery without angina pectoris: Secondary | ICD-10-CM | POA: Diagnosis present

## 2017-02-12 DIAGNOSIS — K5900 Constipation, unspecified: Secondary | ICD-10-CM | POA: Insufficient documentation

## 2017-02-12 DIAGNOSIS — G4733 Obstructive sleep apnea (adult) (pediatric): Secondary | ICD-10-CM | POA: Insufficient documentation

## 2017-02-12 DIAGNOSIS — J439 Emphysema, unspecified: Secondary | ICD-10-CM | POA: Diagnosis not present

## 2017-02-12 DIAGNOSIS — E785 Hyperlipidemia, unspecified: Secondary | ICD-10-CM | POA: Diagnosis present

## 2017-02-12 DIAGNOSIS — E038 Other specified hypothyroidism: Secondary | ICD-10-CM | POA: Diagnosis not present

## 2017-02-12 DIAGNOSIS — G8929 Other chronic pain: Secondary | ICD-10-CM | POA: Diagnosis present

## 2017-02-12 DIAGNOSIS — F0280 Dementia in other diseases classified elsewhere without behavioral disturbance: Secondary | ICD-10-CM | POA: Diagnosis not present

## 2017-02-12 DIAGNOSIS — R072 Precordial pain: Secondary | ICD-10-CM | POA: Diagnosis not present

## 2017-02-12 DIAGNOSIS — R490 Dysphonia: Secondary | ICD-10-CM

## 2017-02-12 DIAGNOSIS — Z951 Presence of aortocoronary bypass graft: Secondary | ICD-10-CM | POA: Diagnosis not present

## 2017-02-12 DIAGNOSIS — F419 Anxiety disorder, unspecified: Secondary | ICD-10-CM | POA: Insufficient documentation

## 2017-02-12 DIAGNOSIS — E782 Mixed hyperlipidemia: Secondary | ICD-10-CM | POA: Diagnosis not present

## 2017-02-12 DIAGNOSIS — L8990 Pressure ulcer of unspecified site, unspecified stage: Secondary | ICD-10-CM | POA: Insufficient documentation

## 2017-02-12 DIAGNOSIS — E039 Hypothyroidism, unspecified: Secondary | ICD-10-CM | POA: Diagnosis not present

## 2017-02-12 LAB — BASIC METABOLIC PANEL
ANION GAP: 6 (ref 5–15)
BUN: 12 mg/dL (ref 6–20)
CALCIUM: 8.3 mg/dL — AB (ref 8.9–10.3)
CO2: 33 mmol/L — ABNORMAL HIGH (ref 22–32)
Chloride: 98 mmol/L — ABNORMAL LOW (ref 101–111)
Creatinine, Ser: 0.93 mg/dL (ref 0.61–1.24)
Glucose, Bld: 187 mg/dL — ABNORMAL HIGH (ref 65–99)
POTASSIUM: 3.5 mmol/L (ref 3.5–5.1)
Sodium: 137 mmol/L (ref 135–145)

## 2017-02-12 LAB — CBC
HEMATOCRIT: 36.1 % — AB (ref 39.0–52.0)
HEMOGLOBIN: 12 g/dL — AB (ref 13.0–17.0)
MCH: 32.1 pg (ref 26.0–34.0)
MCHC: 33.2 g/dL (ref 30.0–36.0)
MCV: 96.5 fL (ref 78.0–100.0)
Platelets: 196 10*3/uL (ref 150–400)
RBC: 3.74 MIL/uL — AB (ref 4.22–5.81)
RDW: 13.6 % (ref 11.5–15.5)
WBC: 9.2 10*3/uL (ref 4.0–10.5)

## 2017-02-12 LAB — TSH: TSH: 3.893 u[IU]/mL (ref 0.350–4.500)

## 2017-02-12 LAB — I-STAT TROPONIN, ED: TROPONIN I, POC: 0 ng/mL (ref 0.00–0.08)

## 2017-02-12 LAB — MAGNESIUM: Magnesium: 2 mg/dL (ref 1.7–2.4)

## 2017-02-12 LAB — TROPONIN I

## 2017-02-12 MED ORDER — CARVEDILOL 3.125 MG PO TABS: 3.1250 mg | ORAL_TABLET | Freq: Two times a day (BID) | ORAL | Status: DC

## 2017-02-12 MED ORDER — FUROSEMIDE 20 MG PO TABS
20.0000 mg | ORAL_TABLET | Freq: Every day | ORAL | Status: DC
  Administered 2017-02-13 – 2017-02-15 (×3): 20 mg via ORAL
  Filled 2017-02-12 (×3): qty 1

## 2017-02-12 MED ORDER — NITROGLYCERIN 0.4 MG SL SUBL
SUBLINGUAL_TABLET | SUBLINGUAL | Status: AC
  Administered 2017-02-12: 0.4 mg
  Filled 2017-02-12: qty 3

## 2017-02-12 MED ORDER — ONDANSETRON HCL 4 MG/2ML IJ SOLN: 4.0000 mg | Freq: Four times a day (QID) | INTRAMUSCULAR | Status: DC | PRN

## 2017-02-12 MED ORDER — OXYCODONE HCL 5 MG PO TABS
15.0000 mg | ORAL_TABLET | Freq: Three times a day (TID) | ORAL | Status: DC | PRN
  Administered 2017-02-12 – 2017-02-15 (×5): 15 mg via ORAL
  Filled 2017-02-12 (×8): qty 3

## 2017-02-12 MED ORDER — ASPIRIN 81 MG PO CHEW
324.0000 mg | CHEWABLE_TABLET | Freq: Once | ORAL | Status: AC
  Administered 2017-02-12: 324 mg via ORAL
  Filled 2017-02-12: qty 4

## 2017-02-12 MED ORDER — POTASSIUM CHLORIDE CRYS ER 20 MEQ PO TBCR
40.0000 meq | EXTENDED_RELEASE_TABLET | Freq: Once | ORAL | Status: DC
  Filled 2017-02-12 (×2): qty 2

## 2017-02-12 MED ORDER — MEMANTINE HCL 10 MG PO TABS
10.0000 mg | ORAL_TABLET | Freq: Two times a day (BID) | ORAL | Status: DC
  Administered 2017-02-12 – 2017-02-15 (×6): 10 mg via ORAL
  Filled 2017-02-12 (×6): qty 1

## 2017-02-12 MED ORDER — TAMSULOSIN HCL 0.4 MG PO CAPS
0.4000 mg | ORAL_CAPSULE | Freq: Every day | ORAL | Status: DC
  Administered 2017-02-13 – 2017-02-15 (×3): 0.4 mg via ORAL
  Filled 2017-02-12 (×3): qty 1

## 2017-02-12 MED ORDER — ACETAMINOPHEN 325 MG PO TABS: 650.0000 mg | ORAL_TABLET | ORAL | Status: DC | PRN

## 2017-02-12 MED ORDER — LEVOTHYROXINE SODIUM 50 MCG PO TABS
50.0000 ug | ORAL_TABLET | Freq: Every day | ORAL | Status: DC
  Administered 2017-02-13 – 2017-02-15 (×3): 50 ug via ORAL
  Filled 2017-02-12 (×3): qty 1

## 2017-02-12 MED ORDER — IOPAMIDOL (ISOVUE-370) INJECTION 76%
100.0000 mL | Freq: Once | INTRAVENOUS | Status: AC | PRN
  Administered 2017-02-12: 100 mL via INTRAVENOUS

## 2017-02-12 MED ORDER — ESCITALOPRAM OXALATE 10 MG PO TABS
10.0000 mg | ORAL_TABLET | Freq: Every day | ORAL | Status: DC
  Administered 2017-02-13 – 2017-02-15 (×3): 10 mg via ORAL
  Filled 2017-02-12 (×3): qty 1

## 2017-02-12 MED ORDER — FLUTICASONE PROPIONATE 50 MCG/ACT NA SUSP
1.0000 | Freq: Every day | NASAL | Status: DC
  Administered 2017-02-12 – 2017-02-14 (×2): 1 via NASAL
  Filled 2017-02-12: qty 16

## 2017-02-12 MED ORDER — SENNOSIDES-DOCUSATE SODIUM 8.6-50 MG PO TABS
1.0000 | ORAL_TABLET | Freq: Every day | ORAL | Status: DC
  Administered 2017-02-12 – 2017-02-13 (×2): 1 via ORAL
  Filled 2017-02-12 (×2): qty 1

## 2017-02-12 MED ORDER — NITROGLYCERIN 0.4 MG SL SUBL: 0.4000 mg | SUBLINGUAL_TABLET | SUBLINGUAL | Status: DC | PRN

## 2017-02-12 MED ORDER — PRAVASTATIN SODIUM 40 MG PO TABS
40.0000 mg | ORAL_TABLET | Freq: Every day | ORAL | Status: DC
  Administered 2017-02-12 – 2017-02-14 (×3): 40 mg via ORAL
  Filled 2017-02-12 (×3): qty 1

## 2017-02-12 MED ORDER — FAMOTIDINE 20 MG PO TABS
20.0000 mg | ORAL_TABLET | Freq: Two times a day (BID) | ORAL | Status: DC
  Administered 2017-02-12 – 2017-02-15 (×6): 20 mg via ORAL
  Filled 2017-02-12 (×6): qty 1

## 2017-02-12 MED ORDER — NITROGLYCERIN 0.4 MG SL SUBL
0.4000 mg | SUBLINGUAL_TABLET | SUBLINGUAL | Status: AC | PRN
  Administered 2017-02-12: 0.4 mg via SUBLINGUAL

## 2017-02-12 MED ORDER — GABAPENTIN 300 MG PO CAPS
300.0000 mg | ORAL_CAPSULE | Freq: Three times a day (TID) | ORAL | Status: DC
  Administered 2017-02-12 – 2017-02-15 (×9): 300 mg via ORAL
  Filled 2017-02-12 (×9): qty 1

## 2017-02-12 MED ORDER — FENTANYL 50 MCG/HR TD PT72
50.0000 ug | MEDICATED_PATCH | TRANSDERMAL | Status: DC
  Administered 2017-02-12 – 2017-02-14 (×2): 50 ug via TRANSDERMAL
  Filled 2017-02-12 (×3): qty 1

## 2017-02-12 MED ORDER — ENOXAPARIN SODIUM 40 MG/0.4ML ~~LOC~~ SOLN
40.0000 mg | SUBCUTANEOUS | Status: DC
  Administered 2017-02-12 – 2017-02-14 (×3): 40 mg via SUBCUTANEOUS
  Filled 2017-02-12 (×3): qty 0.4

## 2017-02-12 MED ORDER — ISOSORBIDE MONONITRATE ER 30 MG PO TB24
30.0000 mg | ORAL_TABLET | Freq: Every day | ORAL | Status: DC
  Administered 2017-02-15: 30 mg via ORAL
  Filled 2017-02-12 (×2): qty 1

## 2017-02-12 MED ORDER — CLOPIDOGREL BISULFATE 75 MG PO TABS
75.0000 mg | ORAL_TABLET | Freq: Every day | ORAL | Status: DC
  Administered 2017-02-13 – 2017-02-15 (×3): 75 mg via ORAL
  Filled 2017-02-12 (×3): qty 1

## 2017-02-12 MED ORDER — ALPRAZOLAM 0.5 MG PO TABS
1.5000 mg | ORAL_TABLET | Freq: Every evening | ORAL | Status: DC | PRN
  Administered 2017-02-12 – 2017-02-14 (×2): 1.5 mg via ORAL
  Filled 2017-02-12: qty 1
  Filled 2017-02-12: qty 3

## 2017-02-12 MED ORDER — MORPHINE SULFATE (PF) 4 MG/ML IV SOLN
4.0000 mg | INTRAVENOUS | Status: DC | PRN
  Administered 2017-02-12: 4 mg via INTRAVENOUS
  Filled 2017-02-12: qty 1

## 2017-02-12 NOTE — ED Notes (Signed)
CN informed of HR drooping to 38-40 at times.

## 2017-02-12 NOTE — H&P (Addendum)
History and Physical  Dustin [name] [id number] DOB: Oct 06, 1934 DOA: 02/12/2017  Referring physician: EDP PCP: Glo Herring., MD   Chief Complaint: chest pain  HPI: Dustin [name] is a 83 y.o. male   With h/o mild dementia, HTN, CAD s/p CABG, s/p stents, last echo EF 50% presented to the ED with c/o chest pain. He reported intermittent chest pain start from upper mid sternum radiation up to his throat and bilateral shoulder, bilateral ear, he described "someone has hit me in the chest with a sledgehammer." he report pain get worse with taking deep breath. he report he has a recent stress test , he has not hear back from his cardiologist. ( wife is not at bedside, not sure if the story is reliable), he also report being hoarse for a week, denies dysphagia, no odynophagia, no cough, no fever, no lower extremity edema, he report at baseline he is active, still drives.  ED course: he has sinus bradycardia on tele, otherwise bp stable, on room air, no hypoxia, cxr /ekg no acute findings, first troponin negative. Cbc at baseline, bmp with normal cr . He is given asa/nitroglycerin in the ED, he is also given morphine in the ED, patient report pain did not improved with asa/nitro but improved with nitro.  hospitalist called to admit patient under observation status for chest pain rule out. .   Review of Systems:  Detail per HPI, Review of systems are otherwise negative  Past Medical History:  Diagnosis Date  . Alzheimer disease   . Anxiety   . Arthritis   . Atrophic gastritis    a. By EGD 02/2013.  [name] Coronary atherosclerosis of native coronary artery    a. Multivessel s/p CABG 1996. b. s/p DES x 2 SVG to PDA 8/12 with distal disease managed medically.  . DDD (degenerative disc disease)    Chronic back pain  . Enlarged prostate   . Essential hypertension, benign   . Hematuria   . Hypothyroidism   . MI (myocardial infarction)   . Mixed hyperlipidemia   . OA (osteoarthritis)   .  OSA (obstructive sleep apnea)    Past Surgical History:  Procedure Laterality Date  . COLONOSCOPY  08/03/2004   Jenkins-numerous large diverticula in the descending, transverse, descending, and sigmoid colon. Otherwise normal exam.  . COLONOSCOPY  07/12/2012   RMR: External hemorrhoidal tag; multiple rectal and colonic polyps removed and/or treated as described above. Pancolonic diverticulosis. Bx-tubular adenomas, rectal hyperplastic polyp. next colonoscopy in 06/2015.  [name] COLONOSCOPY N/A 06/22/2016   Procedure: COLONOSCOPY;  Surgeon: Aviva Signs, MD;  [facility]: [facility];  Service: Gastroenterology;  Laterality: N/A;  730  . CORONARY ANGIOPLASTY WITH STENT PLACEMENT    . CORONARY ARTERY BYPASS GRAFT  1996   LIMA to LAD, SVG to D2, SVG to PDA, SVG to OM1 and OM2  . ESOPHAGOGASTRODUODENOSCOPY N/A 03/16/2013   Procedure: ESOPHAGOGASTRODUODENOSCOPY (EGD);  Surgeon: Daneil Dolin, MD;  Location: [facility];  Service: Endoscopy;  Laterality: N/A;  12:00-moved to [place] notified pt  . ESOPHAGOGASTRODUODENOSCOPY N/A 03/06/2013   Procedure: ESOPHAGOGASTRODUODENOSCOPY (EGD);  Surgeon: Daneil Dolin, MD;  Location: [facility];  Service: Endoscopy;  Laterality: N/A;  . HERNIA REPAIR     Social History:  reports that he quit smoking about 22 years ago. His smoking use included Cigarettes. He has a 80.00 pack-year smoking history. He has never used smokeless tobacco. He reports that he does not drink alcohol or use drugs.  Patient lives at home & is able to participate in activities of daily living independently   No Known Allergies  Family History  Problem Relation Age of Onset  . Heart disease    . Colon cancer Neg Hx       Prior to Admission medications   Medication Sig Start Date End Date Taking? Authorizing Provider  ALPRAZolam Duanne Moron) 1 MG tablet Take 1 tablet (1 mg total) by mouth 2 (two) times daily. For anxiety/sleep Patient taking differently: Take 1.5 mg by mouth at  bedtime as needed. For anxiety/sleep 12/25/16  Yes Thurnell Lose, MD  amLODipine (NORVASC) 10 MG tablet Take 1 tablet by mouth daily. 03/05/16  Yes Historical Provider, MD  carvedilol (COREG) 3.125 MG tablet Take 1 tablet by mouth 2 (two) times daily. 02/02/17  Yes Historical Provider, MD  clopidogrel (PLAVIX) 75 MG tablet Take 1 tablet (75 mg total) by mouth daily. 01/29/15  Yes Hosie Poisson, MD  donepezil (ARICEPT) 10 MG tablet Take 1 tablet by mouth At bedtime. 07/01/12  Yes Historical Provider, MD  escitalopram (LEXAPRO) 10 MG tablet Take 10 mg by mouth daily.   Yes Historical Provider, MD  fentaNYL (DURAGESIC - DOSED MCG/HR) 50 MCG/HR 1 patch every other day. 02/11/17  Yes Historical Provider, MD  furosemide (LASIX) 20 MG tablet Take 20 mg by mouth daily.  12/03/16  Yes Historical Provider, MD  gabapentin (NEURONTIN) 300 MG capsule Take 1 capsule (300 mg total) by mouth 3 (three) times daily. 12/25/16  Yes Thurnell Lose, MD  hydrochlorothiazide (MICROZIDE) 12.5 MG capsule Take 1 capsule by mouth daily. 03/05/16  Yes Historical Provider, MD  isosorbide mononitrate (IMDUR) 30 MG 24 hr tablet Take 30 mg by mouth daily.   Yes Historical Provider, MD  levothyroxine (SYNTHROID, LEVOTHROID) 50 MCG tablet Take 50 mcg by mouth daily. 10/04/16  Yes Historical Provider, MD  memantine (NAMENDA) 10 MG tablet Take 1 tablet by mouth 2 (two) times daily. 03/05/16  Yes Historical Provider, MD  nitroGLYCERIN (NITROSTAT) 0.4 MG SL tablet PLACE ONE TABLET UNDER TONGUE EVERY 5 MIN UP TO 3 DOSES AS NEEDED FORCHEST PAIN. 01/07/17  Yes Lendon Colonel, NP  oxycodone (ROXICODONE) 30 MG immediate release tablet Take 15 mg by mouth 3 (three) times daily. 12/09/16  Yes Historical Provider, MD  pravastatin (PRAVACHOL) 40 MG tablet TAKE (1) TABLET BY MOUTH AT BEDTIME. 06/21/16  Yes Lendon Colonel, NP  tamsulosin (FLOMAX) 0.4 MG CAPS capsule Take 1 capsule by mouth daily. 02/23/16  Yes Historical Provider, MD    Physical  Exam: BP 128/56 (BP Location: Left Arm)   Pulse (!) 53   Temp 97.7 F (36.5 C) (Oral)   Resp 22   Ht 5\' 9"  (1.753 m)   Wt 90.7 kg (200 lb)   SpO2 96%   BMI 29.53 kg/m   General:  NAD, he does has a hoarse voice, report is new Eyes: PERRL ENT: unremarkable Neck: supple, no JVD Cardiovascular: RRR Respiratory: CTABL Abdomen: soft/ND/ND, positive bowel sounds Skin: no rash Musculoskeletal:  No edema Psychiatric: calm/cooperative Neurologic: no focal findings  , aaox3, report mild memory loss          Labs on Admission:  Basic Metabolic Panel:  Recent Labs Lab 02/12/17 1319  NA 137  K 3.5  CL 98*  CO2 33*  GLUCOSE 187*  BUN 12  CREATININE 0.93  CALCIUM 8.3*   Liver Function Tests: No results for input(s): AST, ALT, ALKPHOS, BILITOT, PROT, ALBUMIN in  the last 168 hours. No results for input(s): LIPASE, AMYLASE in the last 168 hours. No results for input(s): AMMONIA in the last 168 hours. CBC:  Recent Labs Lab 02/12/17 1319  WBC 9.2  HGB 12.0*  HCT 36.1*  MCV 96.5  PLT 196   Cardiac Enzymes: No results for input(s): CKTOTAL, CKMB, CKMBINDEX, TROPONINI in the last 168 hours.  BNP (last 3 results)  Recent Labs  04/03/16 1006  BNP 173.0*    ProBNP (last 3 results) No results for input(s): PROBNP in the last 8760 hours.  CBG: No results for input(s): GLUCAP in the last 168 hours.  Radiological Exams on Admission: Dg Chest Port 1 View  Result Date: 02/12/2017 CLINICAL DATA:  Acute chest pain, coronary disease post MI, essential hypertension EXAM: PORTABLE CHEST 1 VIEW COMPARISON:  Portable exam 1327 hours compared to 12/23/2016 FINDINGS: Enlargement of cardiac silhouette post CABG. Mediastinal contours and pulmonary vascularity normal. Minimal atherosclerotic calcification aorta. Mild atelectasis versus infiltrate at RIGHT base. Lungs otherwise clear. No pleural effusion or pneumothorax. Bones demineralized. IMPRESSION: Minimal enlargement of cardiac  silhouette post CABG. Mild atelectasis versus infiltrate at RIGHT base. Electronically Signed   By: Lavonia Dana M.D.   On: 02/12/2017 13:44     Assessment/Plan Present on Admission: **None**  Chest pain: likely atypical. He report hoarseness for a week, may be some nasal congestion and acid reflux, start flonase/pepcid. He also report chest pain is pleuritic, will get CTA to r/o PE, aortic dissection.  Will keep on tele, continue cycle cardiac enzymes, if enzyme negative, CTA unremarkable, will likely able to d/c home with cardiology follow up. ( Dr Harrington Challenger in [place])  CAD s/p CABG. S/p stent: continue coreg, imdur, plavix, statin, hold norvasc for now, may resume at discharge if bp stable  Sinus Bradycardia, does not seem to be symptomatic, bp stable ,denies dizziness. Will continue low dose coreg, but d/c aricept, check tsh, keep mag >2, k >4, keep on tele  5:20pm addendum"Bradycardia in the high 30's when awake, d/c coreg"  HTN; bp low normal, will keep on low dose coreg, imdur, hold norvasc. There are two diuretics listed on his home meds list, will d/c hctz, continue lasix.  Hypothyroidism: continue synthroid, check tsh.  BPH, denies urinary symptom, continue flomax  Chronic pain: continue home meds fentanyl patch and prn oxy  DVT prophylaxis: lovenox  Consultants: none  Code Status: full   Family Communication:  Patient   Disposition Plan: tele/obs  Time spent: 65mins  Miyani Cronic MD, PhD [name] Pager [phone] If 7PM-7AM, please contact night-coverage at [email or web address], password [facility]

## 2017-02-12 NOTE — ED Notes (Signed)
Attempted to call report

## 2017-02-12 NOTE — ED Triage Notes (Signed)
Patient c/o mid chest pain that radiates into neck. Patient reports shortness of breath. Extensive cardiac hx with open heart surgery. Patient was here in ER 3 weeks ago for chest pain-had elevated troponin and was to follow up with cardiologist but states he was unable to get appointment. Patient states since then tried to move a refrigerator from back of truck.

## 2017-02-12 NOTE — ED Provider Notes (Signed)
[place] DEPT Provider Note   CSN: [id number] Arrival date & time: 02/12/17  1307     History   Chief Complaint Chief Complaint  Patient presents with  . Chest Pain    HPI Dustin [name] is a 83 y.o. male.  The history is provided by the patient and the spouse. The history is limited by the condition of the patient (Hx dementia).  Chest Pain    Pt was seen at 1330. Per pt and his wife: c/o gradual onset and persistence of mid-sternal chest "pain" that radiates into his neck for the past 4 days. Has been associated with SOB and nausea. Pt describes the CP as "someone has hit me in the chest with a sledgehammer." Pt has significant hx of dementia and HPI is mostly given by pt's wife.  Unclear for how long his symptoms occur. Pt does not recall if he took his own ntg. [name] states pt does take daily narcotics for chronic pain. [name] states pt tried to move a refrigerator from the back of a truck, but his CP symptoms began before that; may have worsened afterwards.    Past Medical History:  Diagnosis Date  . Alzheimer disease   . Anxiety   . Arthritis   . Atrophic gastritis    a. By EGD 02/2013.  [name] Coronary atherosclerosis of native coronary artery    a. Multivessel s/p CABG 1996. b. s/p DES x 2 SVG to PDA 8/12 with distal disease managed medically.  . DDD (degenerative disc disease)    Chronic back pain  . Enlarged prostate   . Essential hypertension, benign   . Hematuria   . Hypothyroidism   . MI (myocardial infarction)   . Mixed hyperlipidemia   . OA (osteoarthritis)   . OSA (obstructive sleep apnea)     Patient Active Problem List   Diagnosis Date Noted  . Chronic diastolic CHF (congestive heart failure) ([place]) 12/24/2016  . Sinus bradycardia 12/23/2016  . Foot pain, bilateral 12/23/2016  . Hypokalemia 04/05/2016  . Acute respiratory failure ([place]) 04/03/2016  . Acute encephalopathy 04/03/2016  . Hypothyroidism 04/03/2016  . Aspiration pneumonia ([place])  04/03/2016  . Dementia 04/03/2016  . Acute respiratory failure with hypoxia ([place]) 04/03/2016  . Memory loss 04/20/2015  . Hypoxia 01/27/2015  . CAP (community acquired pneumonia) 01/27/2015  . Fever 08/30/2014  . Healthcare associated bacterial pneumonia 08/30/2014  . Toxic Metabolic encephalopathy 99991111  . Sepsis ([place]) 08/30/2014  . Neck pain on right side 08/29/2014  . Neck pain 08/29/2014  . Left-sided weakness 06/28/2014  . Chest pain 06/27/2014  . Tubular adenoma of colon 03/05/2013  . Anorexia 03/05/2013  . Loss of weight 03/05/2013  . Chronic constipation 07/04/2012  . Bronchitis 08/16/2011  . Hyperlipidemia 10/01/2009  . OSA (obstructive sleep apnea) 10/01/2009  . [place] DISEASE 10/01/2009  . HYPERTENSION, BENIGN 10/01/2009  . Coronary artery disease 10/01/2009    Past Surgical History:  Procedure Laterality Date  . COLONOSCOPY  08/03/2004   Jenkins-numerous large diverticula in the descending, transverse, descending, and sigmoid colon. Otherwise normal exam.  . COLONOSCOPY  07/12/2012   RMR: External hemorrhoidal tag; multiple rectal and colonic polyps removed and/or treated as described above. Pancolonic diverticulosis. Bx-tubular adenomas, rectal hyperplastic polyp. next colonoscopy in 06/2015.  [name] COLONOSCOPY N/A 06/22/2016   Procedure: COLONOSCOPY;  Surgeon: Aviva Signs, MD;  Location[facility];  Service: Gastroenterology;  Laterality: N/A;  730  . CORONARY ANGIOPLASTY WITH STENT PLACEMENT    .  CORONARY ARTERY BYPASS GRAFT  1996   LIMA to LAD, SVG to D2, SVG to PDA, SVG to OM1 and OM2  . ESOPHAGOGASTRODUODENOSCOPY N/A 03/16/2013   Procedure: ESOPHAGOGASTRODUODENOSCOPY (EGD);  Surgeon: Daneil Dolin, MD;  Location: [facility];  Service: Endoscopy;  Laterality: N/A;  12:00-moved to [place] notified pt  . ESOPHAGOGASTRODUODENOSCOPY N/A 03/06/2013   Procedure: ESOPHAGOGASTRODUODENOSCOPY (EGD);  Surgeon: Daneil Dolin, MD;  Location: [facility];   Service: Endoscopy;  Laterality: N/A;  . HERNIA REPAIR         Home Medications    Prior to Admission medications   Medication Sig Start Date End Date Taking? Authorizing Provider  ALPRAZolam Duanne Moron) 1 MG tablet Take 1 tablet (1 mg total) by mouth 2 (two) times daily. For anxiety/sleep Patient taking differently: Take 1.5 mg by mouth at bedtime as needed. For anxiety/sleep 12/25/16  Yes Thurnell Lose, MD  amLODipine (NORVASC) 10 MG tablet Take 1 tablet by mouth daily. 03/05/16  Yes Historical Provider, MD  carvedilol (COREG) 3.125 MG tablet Take 1 tablet by mouth 2 (two) times daily. 02/02/17  Yes Historical Provider, MD  clopidogrel (PLAVIX) 75 MG tablet Take 1 tablet (75 mg total) by mouth daily. 01/29/15  Yes Hosie Poisson, MD  donepezil (ARICEPT) 10 MG tablet Take 1 tablet by mouth At bedtime. 07/01/12  Yes Historical Provider, MD  escitalopram (LEXAPRO) 10 MG tablet Take 10 mg by mouth daily.   Yes Historical Provider, MD  fentaNYL (DURAGESIC - DOSED MCG/HR) 50 MCG/HR 1 patch every other day. 02/11/17  Yes Historical Provider, MD  furosemide (LASIX) 20 MG tablet Take 20 mg by mouth daily.  12/03/16  Yes Historical Provider, MD  gabapentin (NEURONTIN) 300 MG capsule Take 1 capsule (300 mg total) by mouth 3 (three) times daily. 12/25/16  Yes Thurnell Lose, MD  hydrochlorothiazide (MICROZIDE) 12.5 MG capsule Take 1 capsule by mouth daily. 03/05/16  Yes Historical Provider, MD  isosorbide mononitrate (IMDUR) 30 MG 24 hr tablet Take 30 mg by mouth daily.   Yes Historical Provider, MD  levothyroxine (SYNTHROID, LEVOTHROID) 50 MCG tablet Take 50 mcg by mouth daily. 10/04/16  Yes Historical Provider, MD  memantine (NAMENDA) 10 MG tablet Take 1 tablet by mouth 2 (two) times daily. 03/05/16  Yes Historical Provider, MD  nitroGLYCERIN (NITROSTAT) 0.4 MG SL tablet PLACE ONE TABLET UNDER TONGUE EVERY 5 MIN UP TO 3 DOSES AS NEEDED FORCHEST PAIN. 01/07/17  Yes Lendon Colonel, NP  oxycodone (ROXICODONE)  30 MG immediate release tablet Take 15 mg by mouth 3 (three) times daily. 12/09/16  Yes Historical Provider, MD  pravastatin (PRAVACHOL) 40 MG tablet TAKE (1) TABLET BY MOUTH AT BEDTIME. 06/21/16  Yes Lendon Colonel, NP  tamsulosin (FLOMAX) 0.4 MG CAPS capsule Take 1 capsule by mouth daily. 02/23/16  Yes Historical Provider, MD    Family History Family History  Problem Relation Age of Onset  . Heart disease    . Colon cancer Neg Hx     Social History Social History  Substance Use Topics  . Smoking status: Former Smoker    Packs/day: 2.00    Years: 40.00    Types: Cigarettes    Quit date: 12/27/1994  . Smokeless tobacco: Never Used  . Alcohol use No     Allergies   Patient has no known allergies.   Review of Systems Review of Systems  Unable to perform ROS: Dementia  Cardiovascular: Positive for chest pain.  Physical Exam Updated Vital Signs BP 128/56 (BP Location: Left Arm)   Pulse (!) 53   Temp 97.7 F (36.5 C) (Oral)   Resp 22   Ht 5\' 9"  (1.753 m)   Wt 200 lb (90.7 kg)   SpO2 96%   BMI 29.53 kg/m   Physical Exam 1335: Physical examination:  Nursing notes reviewed; Vital signs and O2 SAT reviewed;  Constitutional: Well developed, Well nourished, Well hydrated, In no acute distress; Head:  Normocephalic, atraumatic; Eyes: EOMI, PERRL, No scleral icterus; ENMT: Mouth and pharynx normal, Mucous membranes moist; Neck: Supple, Full range of motion, No lymphadenopathy; Cardiovascular: Regular rate and rhythm, No gallop; Respiratory: Breath sounds clear & equal bilaterally, No wheezes.  Speaking full sentences with ease, Normal respiratory effort/excursion; Chest: Nontender, Movement normal; Abdomen: Soft, Nontender, Nondistended, Normal bowel sounds; Genitourinary: No CVA tenderness; Extremities: Pulses normal, No tenderness, +tr pedal edema bilat. No calf edema or asymmetry.; Neuro: Awake, alert, confused per hx dementia. Poor historian. Major CN grossly intact. No  facial droop. Speech clear. No gross focal motor deficits in extremities.; Skin: Color normal, Warm, Dry.   ED Treatments / Results  Labs (all labs ordered are listed, but only abnormal results are displayed)   EKG  EKG Interpretation None       Radiology   Procedures Procedures (including critical care time)  Medications Ordered in ED Medications  morphine 4 MG/ML injection 4 mg (not administered)  nitroGLYCERIN (NITROSTAT) 0.4 MG SL tablet (0.4 mg  Given 02/12/17 1327)  nitroGLYCERIN (NITROSTAT) SL tablet 0.4 mg (0.4 mg Sublingual Given 02/12/17 1342)  aspirin chewable tablet 324 mg (324 mg Oral Given 02/12/17 1343)     Initial Impression / Assessment and Plan / ED Course  I have reviewed the triage vital signs and the nursing notes.  Pertinent labs & imaging results that were available during my care of the patient were reviewed by me and considered in my medical decision making (see chart for details).  MDM Reviewed: previous chart, nursing note and vitals Reviewed previous: labs and ECG Interpretation: labs, ECG and x-ray   ED ECG REPORT   Date: 02/12/2017  Rate: 55  Rhythm: normal sinus rhythm  QRS Axis: normal  Intervals: normal  ST/T Wave abnormalities: nonspecific ST/T changes  Conduction Disutrbances:nonspecific intraventricular conduction delay  Narrative Interpretation:   Old EKG Reviewed: unchanged; no significant changes from previous EKG dated 12/24/2016.   Results for orders placed or performed during the hospital encounter of [id number]  Basic metabolic panel  Result Value Ref Range   Sodium 137 135 - 145 mmol/L   Potassium 3.5 3.5 - 5.1 mmol/L   Chloride 98 (L) 101 - 111 mmol/L   CO2 33 (H) 22 - 32 mmol/L   Glucose, Bld 187 (H) 65 - 99 mg/dL   BUN 12 6 - 20 mg/dL   Creatinine, Ser 0.93 0.61 - 1.24 mg/dL   Calcium 8.3 (L) 8.9 - 10.3 mg/dL   GFR calc non Af Amer >60 >60 mL/min   GFR calc Af Amer >60 >60 mL/min   Anion gap 6 5 - 15  CBC    Result Value Ref Range   WBC 9.2 4.0 - 10.5 K/uL   RBC 3.74 (L) 4.22 - 5.81 MIL/uL   Hemoglobin 12.0 (L) 13.0 - 17.0 g/dL   HCT 36.1 (L) 39.0 - 52.0 %   MCV 96.5 78.0 - 100.0 fL   MCH 32.1 26.0 - 34.0 pg   MCHC 33.2 30.0 - 36.0  g/dL   RDW 13.6 11.5 - 15.5 %   Platelets 196 150 - 400 K/uL  I-stat troponin, ED  Result Value Ref Range   Troponin i, poc 0.00 0.00 - 0.08 ng/mL   Comment 3           Dg Chest Port 1 View Result Date: 02/12/2017 CLINICAL DATA:  Acute chest pain, coronary disease post MI, essential hypertension EXAM: PORTABLE CHEST 1 VIEW COMPARISON:  Portable exam 1327 hours compared to 12/23/2016 FINDINGS: Enlargement of cardiac silhouette post CABG. Mediastinal contours and pulmonary vascularity normal. Minimal atherosclerotic calcification aorta. Mild atelectasis versus infiltrate at RIGHT base. Lungs otherwise clear. No pleural effusion or pneumothorax. Bones demineralized. IMPRESSION: Minimal enlargement of cardiac silhouette post CABG. Mild atelectasis versus infiltrate at RIGHT base. Electronically Signed   By: Lavonia Dana M.D.   On: 02/12/2017 13:44    1410:  Pt given ASA, SL ntg and IV morphine with improvement. Pt difficult historian due to dementia (HPI varies with each re-evaluation by myself and ED RN). Given pt's significant cardiac hx, will observation admit. Dx and testing d/w pt and family.  Questions answered.  Verb understanding, agreeable to admit. T/C to [name]. Erlinda Hong, case discussed, including:  HPI, pertinent PM/SHx, VS/PE, dx testing, ED course and treatment:  Agreeable to admit.      Final Clinical Impressions(s) / ED Diagnoses   Final diagnoses:  Chest pain    New Prescriptions New Prescriptions   No medications on file     Francine Graven, DO 02/16/17 1620

## 2017-02-12 NOTE — ED Notes (Signed)
Report given to Lattie Haw, RN at this time for 3A.

## 2017-02-13 DIAGNOSIS — I110 Hypertensive heart disease with heart failure: Secondary | ICD-10-CM | POA: Diagnosis not present

## 2017-02-13 DIAGNOSIS — G894 Chronic pain syndrome: Secondary | ICD-10-CM | POA: Diagnosis not present

## 2017-02-13 DIAGNOSIS — E039 Hypothyroidism, unspecified: Secondary | ICD-10-CM | POA: Diagnosis not present

## 2017-02-13 DIAGNOSIS — R001 Bradycardia, unspecified: Secondary | ICD-10-CM | POA: Diagnosis not present

## 2017-02-13 DIAGNOSIS — D649 Anemia, unspecified: Secondary | ICD-10-CM | POA: Diagnosis not present

## 2017-02-13 DIAGNOSIS — F0280 Dementia in other diseases classified elsewhere without behavioral disturbance: Secondary | ICD-10-CM | POA: Diagnosis not present

## 2017-02-13 DIAGNOSIS — G309 Alzheimer's disease, unspecified: Secondary | ICD-10-CM | POA: Diagnosis not present

## 2017-02-13 DIAGNOSIS — J439 Emphysema, unspecified: Secondary | ICD-10-CM | POA: Diagnosis not present

## 2017-02-13 DIAGNOSIS — I10 Essential (primary) hypertension: Secondary | ICD-10-CM | POA: Diagnosis not present

## 2017-02-13 DIAGNOSIS — I2510 Atherosclerotic heart disease of native coronary artery without angina pectoris: Secondary | ICD-10-CM | POA: Diagnosis not present

## 2017-02-13 DIAGNOSIS — I5032 Chronic diastolic (congestive) heart failure: Secondary | ICD-10-CM

## 2017-02-13 DIAGNOSIS — R079 Chest pain, unspecified: Secondary | ICD-10-CM | POA: Diagnosis not present

## 2017-02-13 DIAGNOSIS — K5900 Constipation, unspecified: Secondary | ICD-10-CM | POA: Diagnosis not present

## 2017-02-13 LAB — BASIC METABOLIC PANEL
Anion gap: 7 (ref 5–15)
BUN: 12 mg/dL (ref 6–20)
CHLORIDE: 100 mmol/L — AB (ref 101–111)
CO2: 31 mmol/L (ref 22–32)
Calcium: 8.6 mg/dL — ABNORMAL LOW (ref 8.9–10.3)
Creatinine, Ser: 0.85 mg/dL (ref 0.61–1.24)
GFR calc Af Amer: >60 mL/min (ref >60)
GFR calc non Af Amer: >60 mL/min (ref >60)
Glucose, Bld: 105 mg/dL — ABNORMAL HIGH (ref 65–99)
Potassium: 3.8 mmol/L (ref 3.5–5.1)
Sodium: 138 mmol/L (ref 135–145)

## 2017-02-13 LAB — CBC
HEMATOCRIT: 36 % — AB (ref 39.0–52.0)
HEMOGLOBIN: 11.9 g/dL — AB (ref 13.0–17.0)
MCH: 31.9 pg (ref 26.0–34.0)
MCHC: 33.1 g/dL (ref 30.0–36.0)
MCV: 96.5 fL (ref 78.0–100.0)
Platelets: 216 10*3/uL (ref 150–400)
RBC: 3.73 MIL/uL — ABNORMAL LOW (ref 4.22–5.81)
RDW: 13.6 % (ref 11.5–15.5)
WBC: 10.6 10*3/uL — ABNORMAL HIGH (ref 4.0–10.5)

## 2017-02-13 LAB — TROPONIN I: Troponin I: <0.03 ng/mL (ref <0.03)

## 2017-02-13 NOTE — Progress Notes (Signed)
PROGRESS NOTE    Dustin [name]  [id number] DOB: Sep 15, 1934 DOA: 02/12/2017 PCP: Glo Herring., MD    Brief Narrative:  83 year old male with a history of coronary artery disease, hypertension and mild dementia who presents to the hospital with complaints of chest discomfort, in midsternum reading up to his throat and bilateral shoulders. Pain was worse with taking a deep breath. He reports he was trying to do heavy lifting before admission. Feels that his symptoms may be related to overexertion. He was admitted to the hospital for further evaluation.   Assessment & Plan:   Active Problems:   Hyperlipidemia   ALZHEIMERS DISEASE   HYPERTENSION, BENIGN   Coronary artery disease   Chest pain   Hypothyroidism   Sinus bradycardia   Chronic diastolic CHF (congestive heart failure) ([place])   1. Chest pain. Patient ruled out for ACS with negative cardiac markers. Pain does appear to be atypical. CT chest was negative for pulmonary embolus. He does not have any further symptoms.  2. Sinus bradycardia. Patient heart rate down into the high 20s, mostly mid 30s. Coreg was discontinued yesterday. TSH is normal. Pauses noted on telemetry. Discussed with Dr. Radford Pax, on-call for cardiology who felt that it would take at least 48 hours for carvedilol to wash out of his system. She recommended continued observation and for cardiology consult in a.m .  3. Hypertension. Blood pressure has been low normal. Continue current treatments.  4. Coronary artery disease status post CABG . patient had stress test in 12/2016 that did not show any reversible ischemia. No further chest pain at this time. Continue medical management.  5. Hypothyroidism. Continue Synthroid. TSH normal.  6. Chronic pain syndrome. Continue fentanyl patch and oxycodone.  7. Dementia. Aricept currently on hold due to issues of bradycardia.   DVT prophylaxis: Lovenox Code Status: Full code Family Communication: Discussed with  patient Disposition Plan: Discharge home once improved.   Consultants:   cardiology  Procedures:     Antimicrobials:       Subjective: Chest pain resolved. No shortness of breath. No lightheadedness. Does have some nausea.  Objective: Vitals:   02/12/17 2008 02/13/17 0004 02/13/17 0353 02/13/17 1417  BP: (!) 124/48 (!) 125/46 (!) 115/44 (!) 111/42  Pulse: (!) 47 (!) 53 (!) 50 (!) 40  Resp:  16 18 16   Temp: 97.6 F (36.4 C) 98.4 F (36.9 C) 98.4 F (36.9 C) 97.5 F (36.4 C)  TempSrc: Oral Oral Oral Oral  SpO2: 96% 93% 95% 95%  Weight:      Height:        Intake/Output Summary (Last 24 hours) at 02/13/17 1654 Last data filed at 02/13/17 1300  Gross per 24 hour  Intake              680 ml  Output              350 ml  Net              330 ml   Filed Weights   02/12/17 1324 02/12/17 1628  Weight: 90.7 kg (200 lb) 91.3 kg (201 lb 4.5 oz)    Examination:  General exam: Appears calm and comfortable  Respiratory system: Clear to auscultation. Respiratory effort normal. Cardiovascular system: S1 & S2 heard, bradycardic. No JVD, murmurs, rubs, gallops or clicks. No pedal edema. Gastrointestinal system: Abdomen is nondistended, soft and nontender. No organomegaly or masses felt. Normal bowel sounds heard. Central nervous system: Alert and oriented. No focal  neurological deficits. Extremities: Symmetric 5 x 5 power. Skin: No rashes, lesions or ulcers Psychiatry: Judgement and insight appear normal. Mood & affect appropriate.     Data Reviewed: I have personally reviewed following labs and imaging studies  CBC:  Recent Labs Lab 02/12/17 1319 02/13/17 0525  WBC 9.2 10.6*  HGB 12.0* 11.9*  HCT 36.1* 36.0*  MCV 96.5 96.5  PLT 196 [id number]   Basic Metabolic Panel:  Recent Labs Lab 02/12/17 1319 02/13/17 0525  NA 137 138  K 3.5 3.8  CL 98* 100*  CO2 33* 31  GLUCOSE 187* 105*  BUN 12 12  CREATININE 0.93 0.85  CALCIUM 8.3* 8.6*  MG 2.0  --     GFR: Estimated Creatinine Clearance: 78.7 mL/min (by C-G formula based on SCr of 0.85 mg/dL). Liver Function Tests: No results for input(s): AST, ALT, ALKPHOS, BILITOT, PROT, ALBUMIN in the last 168 hours. No results for input(s): LIPASE, AMYLASE in the last 168 hours. No results for input(s): AMMONIA in the last 168 hours. Coagulation Profile: No results for input(s): INR, PROTIME in the last 168 hours. Cardiac Enzymes:  Recent Labs Lab 02/12/17 1718 02/12/17 2305 02/13/17 0525  TROPONINI <0.03 <0.03 <0.03   BNP (last 3 results) No results for input(s): PROBNP in the last 8760 hours. HbA1C: No results for input(s): HGBA1C in the last 72 hours. CBG: No results for input(s): GLUCAP in the last 168 hours. Lipid Profile: No results for input(s): CHOL, HDL, LDLCALC, TRIG, CHOLHDL, LDLDIRECT in the last 72 hours. Thyroid Function Tests:  Recent Labs  02/12/17 1718  TSH 3.893   Anemia Panel: No results for input(s): VITAMINB12, FOLATE, FERRITIN, TIBC, IRON, RETICCTPCT in the last 72 hours. Sepsis Labs: No results for input(s): PROCALCITON, LATICACIDVEN in the last 168 hours.  No results found for this or any previous visit (from the past 240 hour(s)).       Radiology Studies: Ct Angio Chest Pe W Or Wo Contrast  Result Date: 02/12/2017 CLINICAL DATA:  Acute onset of upper mid sternal chest pain, radiating to the throat and shoulders. Hoarseness. Initial encounter. EXAM: CT ANGIOGRAPHY CHEST WITH CONTRAST TECHNIQUE: Multidetector CT imaging of the chest was performed using the standard protocol during bolus administration of intravenous contrast. Multiplanar CT image reconstructions and MIPs were obtained to evaluate the vascular anatomy. CONTRAST:  100 mL of Isovue 370 IV contrast COMPARISON:  CTA of the chest performed 04/03/2016, and chest radiograph performed earlier today at 1:27 p.m. FINDINGS: Cardiovascular:  There is no evidence of pulmonary embolus. Diffuse coronary  artery calcifications are seen. The patient is status post median sternotomy, with evidence of prior CABG. Scattered calcification noted along the aortic arch, descending thoracic aorta and proximal great vessels. Mediastinum/Nodes: The mediastinum is otherwise unremarkable in appearance. No mediastinal lymphadenopathy is seen. No pericardial effusion is identified. The thyroid gland is unremarkable. No axillary lymphadenopathy is appreciated. Lungs/Pleura: A trace right-sided pleural effusion is noted, with mild associated atelectasis. Minimal emphysema is noted at the lung apices. No pneumothorax is seen. No masses are identified. Upper Abdomen: The visualized portions of the liver and spleen are grossly unremarkable. There is reflux of contrast into the hepatic veins and IVC. The visualized portions of the gallbladder, pancreas and right adrenal gland are within normal limits. Musculoskeletal: No acute osseous abnormalities are identified. The visualized musculature is unremarkable in appearance. Review of the MIP images confirms the above findings. IMPRESSION: 1. No evidence of pulmonary embolus. 2. Trace right-sided pleural effusion, with mild  associated atelectasis. 3. Minimal emphysema at the lung apices. 4. Diffuse coronary artery calcifications seen. 5. Reflux of contrast into the hepatic veins and IVC. Electronically Signed   By: Garald Balding M.D.   On: 02/12/2017 17:31   Dg Chest Port 1 View  Result Date: 02/12/2017 CLINICAL DATA:  Acute chest pain, coronary disease post MI, essential hypertension EXAM: PORTABLE CHEST 1 VIEW COMPARISON:  Portable exam 1327 hours compared to 12/23/2016 FINDINGS: Enlargement of cardiac silhouette post CABG. Mediastinal contours and pulmonary vascularity normal. Minimal atherosclerotic calcification aorta. Mild atelectasis versus infiltrate at RIGHT base. Lungs otherwise clear. No pleural effusion or pneumothorax. Bones demineralized. IMPRESSION: Minimal enlargement of  cardiac silhouette post CABG. Mild atelectasis versus infiltrate at RIGHT base. Electronically Signed   By: Lavonia Dana M.D.   On: 02/12/2017 13:44        Scheduled Meds: . clopidogrel  75 mg Oral Daily  . enoxaparin (LOVENOX) injection  40 mg Subcutaneous Q24H  . escitalopram  10 mg Oral Daily  . famotidine  20 mg Oral BID  . fentaNYL  50 mcg Transdermal Q48H  . fluticasone  1 spray Each Nare Daily  . furosemide  20 mg Oral Daily  . gabapentin  300 mg Oral TID  . isosorbide mononitrate  30 mg Oral Daily  . levothyroxine  50 mcg Oral QAC breakfast  . memantine  10 mg Oral BID  . potassium chloride  40 mEq Oral Once  . pravastatin  40 mg Oral q1800  . senna-docusate  1 tablet Oral QHS  . tamsulosin  0.4 mg Oral Daily   Continuous Infusions:   LOS: 0 days    Time spent: 71mins    Lonette Stevison, MD [name] Pager [phone]  If 7PM-7AM, please contact night-coverage [email or web address] Password [facility] 02/13/2017, 4:54 PM

## 2017-02-13 NOTE — Progress Notes (Signed)
Pts oxygen saturation decreased to 88. Placed on 2L 02 per May, will continue to monitor pt

## 2017-02-13 NOTE — Progress Notes (Signed)
Telemetry called RN and adv that pts HR decreased to 22 with 2 pauses in the same strip. 2.49 and 3.35 second pauses. Dr. Lorin Mercy on call paged and made aware; no new orders.

## 2017-02-14 ENCOUNTER — Encounter (HOSPITAL_COMMUNITY): Payer: Self-pay | Admitting: Student

## 2017-02-14 DIAGNOSIS — R079 Chest pain, unspecified: Secondary | ICD-10-CM | POA: Diagnosis not present

## 2017-02-14 DIAGNOSIS — I25708 Atherosclerosis of coronary artery bypass graft(s), unspecified, with other forms of angina pectoris: Secondary | ICD-10-CM | POA: Diagnosis not present

## 2017-02-14 DIAGNOSIS — E7800 Pure hypercholesterolemia, unspecified: Secondary | ICD-10-CM

## 2017-02-14 DIAGNOSIS — I10 Essential (primary) hypertension: Secondary | ICD-10-CM | POA: Diagnosis not present

## 2017-02-14 DIAGNOSIS — K5900 Constipation, unspecified: Secondary | ICD-10-CM

## 2017-02-14 DIAGNOSIS — G309 Alzheimer's disease, unspecified: Secondary | ICD-10-CM | POA: Diagnosis not present

## 2017-02-14 DIAGNOSIS — I455 Other specified heart block: Secondary | ICD-10-CM

## 2017-02-14 DIAGNOSIS — E039 Hypothyroidism, unspecified: Secondary | ICD-10-CM | POA: Diagnosis not present

## 2017-02-14 DIAGNOSIS — R001 Bradycardia, unspecified: Secondary | ICD-10-CM | POA: Diagnosis not present

## 2017-02-14 DIAGNOSIS — I5032 Chronic diastolic (congestive) heart failure: Secondary | ICD-10-CM | POA: Diagnosis not present

## 2017-02-14 LAB — BASIC METABOLIC PANEL
ANION GAP: 7 (ref 5–15)
BUN: 15 mg/dL (ref 6–20)
CALCIUM: 8.6 mg/dL — AB (ref 8.9–10.3)
CHLORIDE: 99 mmol/L — AB (ref 101–111)
CO2: 34 mmol/L — AB (ref 22–32)
Creatinine, Ser: 0.79 mg/dL (ref 0.61–1.24)
GFR calc non Af Amer: >60 mL/min (ref >60)
Glucose, Bld: 103 mg/dL — ABNORMAL HIGH (ref 65–99)
POTASSIUM: 3.8 mmol/L (ref 3.5–5.1)
Sodium: 140 mmol/L (ref 135–145)

## 2017-02-14 LAB — CBC
HEMATOCRIT: 34.3 % — AB (ref 39.0–52.0)
Hemoglobin: 11.2 g/dL — ABNORMAL LOW (ref 13.0–17.0)
MCH: 31.7 pg (ref 26.0–34.0)
MCHC: 32.7 g/dL (ref 30.0–36.0)
MCV: 97.2 fL (ref 78.0–100.0)
Platelets: 205 10*3/uL (ref 150–400)
RBC: 3.53 MIL/uL — AB (ref 4.22–5.81)
RDW: 13.5 % (ref 11.5–15.5)
WBC: 7.1 10*3/uL (ref 4.0–10.5)

## 2017-02-14 MED ORDER — BISACODYL 10 MG RE SUPP
10.0000 mg | Freq: Once | RECTAL | Status: DC
  Filled 2017-02-14: qty 1

## 2017-02-14 MED ORDER — SODIUM CHLORIDE 0.9% FLUSH
3.0000 mL | INTRAVENOUS | Status: DC | PRN
  Administered 2017-02-14: 3 mL via INTRAVENOUS

## 2017-02-14 MED ORDER — SODIUM CHLORIDE 0.9 % IV SOLN: 250.0000 mL | INTRAVENOUS | Status: DC | PRN

## 2017-02-14 MED ORDER — DOCUSATE NICU RECTAL SYRINGE 10 MG/ML: 2.0000 mL | Freq: Once | RECTAL | Status: DC

## 2017-02-14 MED ORDER — SODIUM CHLORIDE 0.9% FLUSH
3.0000 mL | Freq: Two times a day (BID) | INTRAVENOUS | Status: DC
  Administered 2017-02-14 (×2): 3 mL via INTRAVENOUS

## 2017-02-14 MED ORDER — POLYETHYLENE GLYCOL 3350 17 G PO PACK
17.0000 g | PACK | Freq: Every day | ORAL | Status: DC
  Filled 2017-02-14: qty 1

## 2017-02-14 MED ORDER — SENNOSIDES-DOCUSATE SODIUM 8.6-50 MG PO TABS
2.0000 | ORAL_TABLET | Freq: Two times a day (BID) | ORAL | Status: DC
  Administered 2017-02-14 – 2017-02-15 (×2): 2 via ORAL
  Filled 2017-02-14 (×2): qty 2

## 2017-02-14 MED ORDER — POLYETHYLENE GLYCOL 3350 17 G PO PACK: 17.0000 g | PACK | Freq: Two times a day (BID) | ORAL | Status: DC

## 2017-02-14 NOTE — Progress Notes (Addendum)
PROGRESS NOTE    Dustin [name]  [id number] DOB: 1934-04-17 DOA: 02/12/2017 PCP: Glo Herring., MD   Brief Narrative:  83 year old male with a history of coronary artery disease, hypertension and mild dementia who presents to the hospital with complaints of chest discomfort, in midsternum reading up to his throat and bilateral shoulders. Pain was worse with taking a deep breath. He reports he was trying to do heavy lifting before admission. Feels that his symptoms may be related to overexertion. He was admitted to the hospital for further evaluation.  Assessment & Plan:   Active Problems:   Hyperlipidemia   ALZHEIMERS DISEASE   HYPERTENSION, BENIGN   Coronary artery disease   Chest pain   Hypothyroidism   Sinus bradycardia   Chronic diastolic CHF (congestive heart failure) ([place])   1.  Sinus bradycardia. Patient heart rate down into the high 20s, mostly mid 30s. Coreg was discontinued 2/17. TSH is normal. Pauses noted on telemetry. - Seen by Cards, transferred to [name] for EP evaluation and possible pace maker placement. - [facility]iology [facility]  2.Chest pain. Patient ruled out for ACS with negative cardiac markers. Pain does appear to be atypical, likley musculoskeletal. CT chest was negative for pulmonary embolus. He does not have any further symptoms.  3. Hypertension. Blood pressure has been low normal. Continue current treatments.  4. Coronary artery disease status post CABG . patient had stress test in 12/2016 that did not show any reversible ischemia. No further chest pain at this time. Continue medical management.  5. Hypothyroidism. Continue Synthroid. TSH normal.  6. Chronic pain syndrome. Continue fentanyl patch and oxycodone. Bearing down from constipation may worsen his bradycardia- vasovagal. - Miralax and senakot, doculax.  7. Dementia. Aricept currently on hold due to issues of bradycardia.  8. Mild anemia- Baseline- 11-12. Will  check ferritin, serum iron and saturations, none on file.  DVT prophylaxis: Lovenox Code Status: Full code Family Communication: Discussed with patient Disposition Plan: Discharge home once improved.  Consultants:   cardiology  Procedures:   None  Antimicrobials:    None  Subjective: No chest pain no shortness of breath no dizziness.   Objective: Vitals:   02/13/17 2148 02/14/17 0623 02/14/17 1429 02/14/17 1532  BP: (!) 115/44 (!) 106/43 (!) 121/43 (!) 135/53  Pulse: (!) 46 (!) 40 (!) 32 90  Resp: 20 17 18 16   Temp: 98.2 F (36.8 C) 97.9 F (36.6 C) 98.1 F (36.7 C) 98.1 F (36.7 C)  TempSrc: Oral  Oral Oral  SpO2: 97% 98% 97% 96%  Weight:    90.8 kg (200 lb 3.2 oz)  Height:    5\' 9"  (1.753 m)    Intake/Output Summary (Last 24 hours) at 02/14/17 1712 Last data filed at 02/14/17 0916  Gross per 24 hour  Intake                3 ml  Output                0 ml  Net                3 ml   Filed Weights   02/12/17 1324 02/12/17 1628 02/14/17 1532  Weight: 90.7 kg (200 lb) 91.3 kg (201 lb 4.5 oz) 90.8 kg (200 lb 3.2 oz)    Examination:  General exam: Appears calm and comfortable  Respiratory system: Clear to auscultation. Respiratory effort normal. Cardiovascular system: S1 & S2 heard, bradycardic. No JVD, murmurs,  rubs, gallops or clicks. No pedal edema. Gastrointestinal system: Abdomen is nondistended, soft and nontender. No organomegaly or masses felt. Normal bowel sounds heard. Central nervous system: Alert and oriented. No focal neurological deficits. Extremities: Symmetric 5 x 5 power. Skin: No rashes, lesions or ulcers Psychiatry: Judgement and insight appear normal. Mood & affect appropriate.   Data Reviewed: I have personally reviewed following labs and imaging studies  CBC:  Recent Labs Lab 02/12/17 1319 02/13/17 0525 02/14/17 0510  WBC 9.2 10.6* 7.1  HGB 12.0* 11.9* 11.2*  HCT 36.1* 36.0* 34.3*  MCV 96.5 96.5 97.2  PLT 196 216 [id number]    Basic Metabolic Panel:  Recent Labs Lab 02/12/17 1319 02/13/17 0525 02/14/17 0510  NA 137 138 140  K 3.5 3.8 3.8  CL 98* 100* 99*  CO2 33* 31 34*  GLUCOSE 187* 105* 103*  BUN 12 12 15   CREATININE 0.93 0.85 0.79  CALCIUM 8.3* 8.6* 8.6*  MG 2.0  --   --    Cardiac Enzymes:  Recent Labs Lab 02/12/17 1718 02/12/17 2305 02/13/17 0525  TROPONINI <0.03 <0.03 <0.03   Thyroid Function Tests:  Recent Labs  02/12/17 1718  TSH 3.893   Radiology Studies: Ct Angio Chest Pe W Or Wo Contrast  Result Date: 02/12/2017 CLINICAL DATA:  Acute onset of upper mid sternal chest pain, radiating to the throat and shoulders. Hoarseness. Initial encounter. EXAM: CT ANGIOGRAPHY CHEST WITH CONTRAST TECHNIQUE: Multidetector CT imaging of the chest was performed using the standard protocol during bolus administration of intravenous contrast. Multiplanar CT image reconstructions and MIPs were obtained to evaluate the vascular anatomy. CONTRAST:  100 mL of Isovue 370 IV contrast COMPARISON:  CTA of the chest performed 04/03/2016, and chest radiograph performed earlier today at 1:27 p.m. FINDINGS: Cardiovascular:  There is no evidence of pulmonary embolus. Diffuse coronary artery calcifications are seen. The patient is status post median sternotomy, with evidence of prior CABG. Scattered calcification noted along the aortic arch, descending thoracic aorta and proximal great vessels. Mediastinum/Nodes: The mediastinum is otherwise unremarkable in appearance. No mediastinal lymphadenopathy is seen. No pericardial effusion is identified. The thyroid gland is unremarkable. No axillary lymphadenopathy is appreciated. Lungs/Pleura: A trace right-sided pleural effusion is noted, with mild associated atelectasis. Minimal emphysema is noted at the lung apices. No pneumothorax is seen. No masses are identified. Upper Abdomen: The visualized portions of the liver and spleen are grossly unremarkable. There is reflux of  contrast into the hepatic veins and IVC. The visualized portions of the gallbladder, pancreas and right adrenal gland are within normal limits. Musculoskeletal: No acute osseous abnormalities are identified. The visualized musculature is unremarkable in appearance. Review of the MIP images confirms the above findings. IMPRESSION: 1. No evidence of pulmonary embolus. 2. Trace right-sided pleural effusion, with mild associated atelectasis. 3. Minimal emphysema at the lung apices. 4. Diffuse coronary artery calcifications seen. 5. Reflux of contrast into the hepatic veins and IVC. Electronically Signed   By: Garald Balding M.D.   On: 02/12/2017 17:31    Scheduled Meds: . bisacodyl  10 mg Rectal Once  . clopidogrel  75 mg Oral Daily  . enoxaparin (LOVENOX) injection  40 mg Subcutaneous Q24H  . escitalopram  10 mg Oral Daily  . famotidine  20 mg Oral BID  . fentaNYL  50 mcg Transdermal Q48H  . fluticasone  1 spray Each Nare Daily  . furosemide  20 mg Oral Daily  . gabapentin  300 mg Oral TID  . isosorbide  mononitrate  30 mg Oral Daily  . levothyroxine  50 mcg Oral QAC breakfast  . memantine  10 mg Oral BID  . polyethylene glycol  17 g Oral Daily  . potassium chloride  40 mEq Oral Once  . pravastatin  40 mg Oral q1800  . senna-docusate  1 tablet Oral QHS  . sodium chloride flush  3 mL Intravenous Q12H  . tamsulosin  0.4 mg Oral Daily   Continuous Infusions:   LOS: 0 days   Time spent: 19mins  Bethena Roys, MD [name] Pager [phone]  If 7PM-7AM, please contact night-coverage [email or web address] Password [facility] 02/14/2017, 5:12 PM

## 2017-02-14 NOTE — Progress Notes (Signed)
Transferred to [facility], out in stable condition via [facility], reported to RN.

## 2017-02-14 NOTE — Consult Note (Signed)
ELECTROPHYSIOLOGY CONSULT NOTE    Patient ID: Dustin [name] MRN: [id number], DOB/AGE: 08/26/1934 83 y.o.  Admit date: 02/12/2017 Date of Consult: 02/14/2017   Primary Physician: Glo Herring., MD Primary Cardiologist: Dr. Domenic Polite Requesting MD: Dr. Denton Brick  Reason for Consultation: bradycardia  HPI: Dustin [name] is a 83 y.o. male  CAD s/p CABG 1996, s/p DESx2 to SVG-PDA 07/2011 with distal disease managed medically, HTN, HL, anxiety, atrophic gastritis 02/2013.  The patient had been recently hospitalized in December 2017 with recurrent chest pain, felt to be atypical, an outpatient stress Myoview was recommended. Stress test on 12/28/2016 revealed inferior defect partial improvement consistent with scar/soft tissue attenuation and peri-infarct ischemia, LVEF of 46% and found to be an intermediate risk study, not yet had out patient f/u.    He returned to [facility] 02/12/17 with c/o CP, he was observed to have SB rates often 30's and reported pauses, 3.35 sec, at 0846 that was asymptomatic his home coreg was stopped and it has been >48 hours now.  His CP was deemed musculoskeletal with clear reproducibility with palpation of his chest wall, having recently done more strenuous  Work moving a refrigerator.  Despite holding the BB his HR remains slow generally 40, transiently slower with reported 3-3.4 second pauses.  AT rest and in bed, he is asymptomatic without c/o and no hx of syncope reported.  Records indicate that he has some degree of dimentia, no family at bedside.  LABS K+ 3.8 BUN/Creat 15/0.79 WBC 7.1 H/H 11/34 plts 205 Trop: <0.03 x3 TSH 3.893  Past Medical History:  Diagnosis Date  . Alzheimer disease   . Anxiety   . Arthritis   . Atrophic gastritis    a. By EGD 02/2013.  [name] Coronary atherosclerosis of native coronary artery    a. Multivessel s/p CABG 1996. b. s/p DES x 2 SVG to PDA 8/12 with distal disease managed medically.  . DDD (degenerative disc disease)    Chronic back pain  . Enlarged prostate   . Essential hypertension, benign   . Hematuria   . Hypothyroidism   . MI (myocardial infarction)   . Mixed hyperlipidemia   . OA (osteoarthritis)   . OSA (obstructive sleep apnea)      Surgical History:  Past Surgical History:  Procedure Laterality Date  . COLONOSCOPY  08/03/2004   Jenkins-numerous large diverticula in the descending, transverse, descending, and sigmoid colon. Otherwise normal exam.  . COLONOSCOPY  07/12/2012   RMR: External hemorrhoidal tag; multiple rectal and colonic polyps removed and/or treated as described above. Pancolonic diverticulosis. Bx-tubular adenomas, rectal hyperplastic polyp. next colonoscopy in 06/2015.  [name] COLONOSCOPY N/A 06/22/2016   Procedure: COLONOSCOPY;  Surgeon: Aviva Signs, MD;  Location: [facility];  Service: Gastroenterology;  Laterality: N/A;  730  . CORONARY ANGIOPLASTY WITH STENT PLACEMENT    . CORONARY ARTERY BYPASS GRAFT  1996   LIMA to LAD, SVG to D2, SVG to PDA, SVG to OM1 and OM2  . ESOPHAGOGASTRODUODENOSCOPY N/A 03/16/2013   Procedure: ESOPHAGOGASTRODUODENOSCOPY (EGD);  Surgeon: Daneil Dolin, MD;  Location: [facility];  Service: Endoscopy;  Laterality: N/A;  12:00-moved to [place] notified pt  . ESOPHAGOGASTRODUODENOSCOPY N/A 03/06/2013   Procedure: ESOPHAGOGASTRODUODENOSCOPY (EGD);  Surgeon: Daneil Dolin, MD;  Location: [facility];  Service: Endoscopy;  Laterality: N/A;  . HERNIA REPAIR       Prescriptions Prior to Admission  Medication Sig Dispense Refill Last Dose  . ALPRAZolam (XANAX) 1 MG  tablet Take 1 tablet (1 mg total) by mouth 2 (two) times daily. For anxiety/sleep (Patient taking differently: Take 1.5 mg by mouth at bedtime as needed. For anxiety/sleep) 30 tablet 0 02/11/2017 at Unknown time  . amLODipine (NORVASC) 10 MG tablet Take 1 tablet by mouth daily.   02/12/2017 at Unknown time  . carvedilol (COREG) 3.125 MG tablet Take 1 tablet by mouth 2 (two) times daily.    02/12/2017 at 1300  . clopidogrel (PLAVIX) 75 MG tablet Take 1 tablet (75 mg total) by mouth daily.   02/12/2017 at Unknown time  . donepezil (ARICEPT) 10 MG tablet Take 1 tablet by mouth At bedtime.   02/11/2017 at Unknown time  . escitalopram (LEXAPRO) 10 MG tablet Take 10 mg by mouth daily.   02/12/2017 at Unknown time  . fentaNYL (DURAGESIC - DOSED MCG/HR) 50 MCG/HR 1 patch every other day.   Past Month at Unknown time  . furosemide (LASIX) 20 MG tablet Take 20 mg by mouth daily.    02/12/2017 at Unknown time  . gabapentin (NEURONTIN) 300 MG capsule Take 1 capsule (300 mg total) by mouth 3 (three) times daily. 90 capsule 0 02/12/2017 at Unknown time  . hydrochlorothiazide (MICROZIDE) 12.5 MG capsule Take 1 capsule by mouth daily.   02/12/2017 at Unknown time  . isosorbide mononitrate (IMDUR) 30 MG 24 hr tablet Take 30 mg by mouth daily.   02/12/2017 at Unknown time  . levothyroxine (SYNTHROID, LEVOTHROID) 50 MCG tablet Take 50 mcg by mouth daily.   02/12/2017 at Unknown time  . memantine (NAMENDA) 10 MG tablet Take 1 tablet by mouth 2 (two) times daily.   02/12/2017 at Unknown time  . nitroGLYCERIN (NITROSTAT) 0.4 MG SL tablet PLACE ONE TABLET UNDER TONGUE EVERY 5 MIN UP TO 3 DOSES AS NEEDED FORCHEST PAIN. 25 tablet 3 Past Month at Unknown time  . oxycodone (ROXICODONE) 30 MG immediate release tablet Take 15 mg by mouth 3 (three) times daily.   02/12/2017 at Unknown time  . pravastatin (PRAVACHOL) 40 MG tablet TAKE (1) TABLET BY MOUTH AT BEDTIME. 15 tablet 0 02/11/2017 at Unknown time  . tamsulosin (FLOMAX) 0.4 MG CAPS capsule Take 1 capsule by mouth daily.   02/12/2017 at Unknown time    Inpatient Medications:  . clopidogrel  75 mg Oral Daily  . enoxaparin (LOVENOX) injection  40 mg Subcutaneous Q24H  . escitalopram  10 mg Oral Daily  . famotidine  20 mg Oral BID  . fentaNYL  50 mcg Transdermal Q48H  . fluticasone  1 spray Each Nare Daily  . furosemide  20 mg Oral Daily  . gabapentin  300 mg Oral  TID  . isosorbide mononitrate  30 mg Oral Daily  . levothyroxine  50 mcg Oral QAC breakfast  . memantine  10 mg Oral BID  . potassium chloride  40 mEq Oral Once  . pravastatin  40 mg Oral q1800  . senna-docusate  1 tablet Oral QHS  . sodium chloride flush  3 mL Intravenous Q12H  . tamsulosin  0.4 mg Oral Daily    Allergies: No Known Allergies  Social History   Social History  . Marital status: Married    Spouse name: N/A  . Number of children: 3  . Years of education: N/A   Occupational History  . retired Retired   Social History Main Topics  . Smoking status: Former Smoker    Packs/day: 2.00    Years: 40.00    Types: Cigarettes  Quit date: 12/27/1994  . Smokeless tobacco: Never Used  . Alcohol use No  . Drug use: No  . Sexual activity: Not Currently   Other Topics Concern  . Not on file   Social History Narrative   Lives w/ ailing wife.   Son brings meals 3x per week     Family History  Problem Relation Age of Onset  . Heart disease    . Colon cancer Neg Hx      Review of Systems: All other systems reviewed and are otherwise negative except as noted above.  Physical Exam: Vitals:   02/13/17 2148 02/14/17 0623 02/14/17 1429 02/14/17 1532  BP: (!) 115/44 (!) 106/43 (!) 121/43 (!) 135/53  Pulse: (!) 46 (!) 40 (!) 32 90  Resp: 20 17 18 16   Temp: 98.2 F (36.8 C) 97.9 F (36.6 C) 98.1 F (36.7 C) 98.1 F (36.7 C)  TempSrc: Oral  Oral Oral  SpO2: 97% 98% 97% 96%  Weight:    200 lb 3.2 oz (90.8 kg)  Height:    5\' 9"  (0000000 m)    GEN- The patient is well appearing, alert and oriented x 3 today.   HEENT: normocephalic, atraumatic; sclera clear, conjunctiva pink; hearing intact; oropharynx clear; neck supple, no JVP Lymph- no cervical lymphadenopathy Lungs- CTA b/l, normal work of breathing.  No wheezes, rales, rhonchi Heart- RRR, bradycardic, no murmurs, rubs or gallops, PMI not laterally displaced GI- soft, non-tender, non-distended Extremities- no  clubbing, cyanosis, or edema MS- no significant deformity or atrophy Skin- warm and dry, no rash or lesion Psych- euthymic mood, full affect Neuro- no gross deficits observed  Labs:   Lab Results  Component Value Date   WBC 7.1 02/14/2017   HGB 11.2 (L) 02/14/2017   HCT 34.3 (L) 02/14/2017   MCV 97.2 02/14/2017   PLT 205 02/14/2017    Recent Labs Lab 02/14/17 0510  NA 140  K 3.8  CL 99*  CO2 34*  BUN 15  CREATININE 0.79  CALCIUM 8.6*  GLUCOSE 103*      Radiology/Studies:  Ct Angio Chest Pe W Or Wo Contrast Result Date: 02/12/2017 CLINICAL DATA:  Acute onset of upper mid sternal chest pain, radiating to the throat and shoulders. Hoarseness. Initial encounter. EXAM: CT ANGIOGRAPHY CHEST WITH CONTRAST TECHNIQUE: Multidetector CT imaging of the chest was performed using the standard protocol during bolus administration of intravenous contrast. Multiplanar CT image reconstructions and MIPs were obtained to evaluate the vascular anatomy. CONTRAST:  100 mL of Isovue 370 IV contrast COMPARISON:  CTA of the chest performed 04/03/2016, and chest radiograph performed earlier today at 1:27 p.m. FINDINGS: Cardiovascular:  There is no evidence of pulmonary embolus. Diffuse coronary artery calcifications are seen. The patient is status post median sternotomy, with evidence of prior CABG. Scattered calcification noted along the aortic arch, descending thoracic aorta and proximal great vessels. Mediastinum/Nodes: The mediastinum is otherwise unremarkable in appearance. No mediastinal lymphadenopathy is seen. No pericardial effusion is identified. The thyroid gland is unremarkable. No axillary lymphadenopathy is appreciated. Lungs/Pleura: A trace right-sided pleural effusion is noted, with mild associated atelectasis. Minimal emphysema is noted at the lung apices. No pneumothorax is seen. No masses are identified. Upper Abdomen: The visualized portions of the liver and spleen are grossly unremarkable.  There is reflux of contrast into the hepatic veins and IVC. The visualized portions of the gallbladder, pancreas and right adrenal gland are within normal limits. Musculoskeletal: No acute osseous abnormalities are identified. The visualized  musculature is unremarkable in appearance. Review of the MIP images confirms the above findings. IMPRESSION: 1. No evidence of pulmonary embolus. 2. Trace right-sided pleural effusion, with mild associated atelectasis. 3. Minimal emphysema at the lung apices. 4. Diffuse coronary artery calcifications seen. 5. Reflux of contrast into the hepatic veins and IVC. Electronically Signed   By: Garald Balding M.D.   On: 02/12/2017 17:31   Dg Chest Port 1 View Result Date: 02/12/2017 CLINICAL DATA:  Acute chest pain, coronary disease post MI, essential hypertension EXAM: PORTABLE CHEST 1 VIEW COMPARISON:  Portable exam 1327 hours compared to 12/23/2016 FINDINGS: Enlargement of cardiac silhouette post CABG. Mediastinal contours and pulmonary vascularity normal. Minimal atherosclerotic calcification aorta. Mild atelectasis versus infiltrate at RIGHT base. Lungs otherwise clear. No pleural effusion or pneumothorax. Bones demineralized. IMPRESSION: Minimal enlargement of cardiac silhouette post CABG. Mild atelectasis versus infiltrate at RIGHT base. Electronically Signed   By: Lavonia Dana M.D.   On: 02/12/2017 13:44    EKG: #1 SB, 55bpm, PR 124ms, QRS 173ms Most recent SB, 43bpm, PR 167ms, QRS 146ms TELEMETRY: SR/SB, sinus arrhythmia, generally   06/28/14: TTE Study Conclusions - Left ventricle: Systolic function is low normal, EF 50%. The cavity size was mildly dilated. Wall thickness was increased in a pattern of mild LVH. There was an increased relative contribution of atrial contraction to ventricular filling. Doppler parameters are consistent with abnormal left ventricular relaxation (grade 1 diastolic dysfunction). Doppler parameters are consistent  with high ventricular filling pressure. - Aortic valve: Mildly calcified annulus. Trileaflet; mildly thickened leaflets. There was no stenosis. - Aorta: Borderline aortic root dilatation. 3.9 cm. - Mitral valve: There was moderate regurgitation. - Left atrium: The atrium was mildly dilated. - Right ventricle: Systolic function was mildly reduced. - Tricuspid valve: There was mild-moderate regurgitation. - Pulmonary arteries: PA peak pressure: 31 mm Hg (S).  12/28/16 myoview  Inferior defect that shows partial improvement consistent with scar/soft tissue attenuation and periinfarct ischemia  LVEF 46%  Intermediate risk study     Assessment and Plan:   1. Bradycardia     Asymptomatic (at rest)     Off BB >48hours     We have asked the RN to ambulate the patient making note of HR response and any symptoms tonight  2. CP     Musculoskeletal     r/o 3 negative enzymes  3. HTN     Stable  4. Constipation     Will order stool softener   Signed, Tommye Standard, PA-C 02/14/2017 4:15 PM  I have seen and examined this patient with Tommye Standard.  Agree with above, note added to reflect my findings.  On exam, regular bradycardic, no murmurs, lungs clear. Presented to the hospital with chest pain, found to be musculoskeletal. The patient was moving a refrigerator. The patient's exam is consistent with musculoskeletal chest pain with reproducible pain to palpation. While he was hospitalized, both his Aricept and his carvedilol were stopped. He was found to be bradycardic with heart rates that dipped down into the 30s. At times this was during waking hours. The patient does not have any complaints of fatigue, shortness of breath, weakness, or syncope. In reviewing his EKG, he does not have any AV conduction delay, but does have sinus node dysfunction. We'll plan to ambulate the patient and review his heart rate during ambulation. Should his heart rate not respond, or if he becomes  symptomatic, pacemaker placement would be reasonable.    Will M. Camnitz MD  02/14/2017 4:38 PM

## 2017-02-14 NOTE — Progress Notes (Signed)
Patient ambulated approximately 500 feet with initial heart rate of 58.  Patient's heart rate increased to high of 68 on completion of his walk.  Patient was asymptomatic during ambulation with the exception of neuropathic pain in his feet.

## 2017-02-14 NOTE — Progress Notes (Signed)
Patient arrived to 2W room 17.  Telemetry monitor applied and CCMD notified.  Patient oriented to unit and room to include phone and call light. Will continue to monitor.

## 2017-02-14 NOTE — Progress Notes (Signed)
Called for patient regarding bradycardia into the 20s.  He is at his baseline.  Sustaining in 24-25 range and then resumes into the 30-50 range.  Based on the recommendations from cardiology earlier today, it does not appear that intervention is needed since the patient is still mentating well and at his baseline.  If that changes, we can try to reverse the beta blocker with Glucagon.  However, this far out from discontinuation of the beta blocker, it is increasingly likely that this is sick sinus syndrome and that he will require a pacemaker.  Will defer to day team in the AM unless he has clinical changes overnight.  Carlyon Shadow, M.D.

## 2017-02-14 NOTE — Consult Note (Addendum)
CARDIOLOGY CONSULT NOTE   Patient ID: Dustin [name] MRN: [id number] DOB/AGE: June 20, 1934 83 y.o.  Admit Date: 02/12/2017 Referring Physician: Thermotaxes MD Primary Physician: Glo Herring., MD Consulting Cardiologist: Kate Sable MD Primary Cardiologist: Rozann Lesches MD Reason for Consultation: Chest Pain with CAD  Clinical Summary Dustin [name] is a 83 y.o.male with known history of CAD, hx of CABG 1996, Hypertension, hypothyroidism, obstructive sleep apnea, and dementia. The patient was recently hospitalized in December 2017 with recurrent chest pain, felt to be atypical, an outpatient stress Myoview was recommended. Stress test on 12/28/2016 revealed inferior defect partial improvement consistent with scar/soft tissue attenuation and peri-infarct ischemia, LVEF of 46% and found to be an intermediate risk study. He did not follow-up in our office post procedure.  Unfortunately the patient presented to the emergency room on 02/12/2017 with complaints of intermittent chest discomfort, radiating from the mid sternum into his throat and bilateral shoulders. Pain was worse with inspiration. The patient has history of dementia, and history is obtained from current and past medical records. It is unclear if the history he is providing is accurate. He is oriented to time and place. He states that he helped someone move a refrigerator last week and has been sore in his upper chest and shoulders and neck since that time. He is also anxious to return home and does not wish to remain in the hospital any longer.  On arrival to the emergency room the patient's blood pressure was 120/56, heart rate 53, O2 sat 96%, he was afebrile. Potassium 3.5 chloride 98, creatinine 0.93, glucose at 187. Hemoglobin 12.0, hematocrit 36.1. Troponin negative 3. EKG revealed sinus bradycardia heart rate of 43 bpm with LBBB, which has become more prominent  from prior EKG on 12/24/2016. CT scan was completed  revealing no evidence of PE, triceps right-sided pleural effusion with mild atelectasis, emphysema was found in the lung apices, with diffuse coronary artery calcifications seen. Chest x-ray revealed mild atelectasis versus infiltrate at the right base.  The patient was treated with nitroglycerin sublingual, aspirin 324 mg, morphine, and potassium replacement. The patient was found to have episode of bradycardia heart rate in the 20s, transiently with heart rate returning to her 30s to 50 range. Cardiology was consulted by phone over the weekend, the patient had been off his beta blocker since admission, with consideration now to have a pacemaker. We are asked for our recommendations.   No Known Allergies  Medications Scheduled Medications: . clopidogrel  75 mg Oral Daily  . enoxaparin (LOVENOX) injection  40 mg Subcutaneous Q24H  . escitalopram  10 mg Oral Daily  . famotidine  20 mg Oral BID  . fentaNYL  50 mcg Transdermal Q48H  . fluticasone  1 spray Each Nare Daily  . furosemide  20 mg Oral Daily  . gabapentin  300 mg Oral TID  . isosorbide mononitrate  30 mg Oral Daily  . levothyroxine  50 mcg Oral QAC breakfast  . memantine  10 mg Oral BID  . potassium chloride  40 mEq Oral Once  . pravastatin  40 mg Oral q1800  . senna-docusate  1 tablet Oral QHS  . tamsulosin  0.4 mg Oral Daily     Infusions:   PRN Medications:  acetaminophen, ALPRAZolam, nitroGLYCERIN, ondansetron (ZOFRAN) IV, oxycodone   Past Medical History:  Diagnosis Date  . Alzheimer disease   . Anxiety   . Arthritis   . Atrophic gastritis    a. By EGD 02/2013.  [name] Coronary  atherosclerosis of native coronary artery    a. Multivessel s/p CABG 1996. b. s/p DES x 2 SVG to PDA 8/12 with distal disease managed medically.  . DDD (degenerative disc disease)    Chronic back pain  . Enlarged prostate   . Essential hypertension, benign   . Hematuria   . Hypothyroidism   . MI (myocardial infarction)   . Mixed  hyperlipidemia   . OA (osteoarthritis)   . OSA (obstructive sleep apnea)     Past Surgical History:  Procedure Laterality Date  . COLONOSCOPY  08/03/2004   Jenkins-numerous large diverticula in the descending, transverse, descending, and sigmoid colon. Otherwise normal exam.  . COLONOSCOPY  07/12/2012   RMR: External hemorrhoidal tag; multiple rectal and colonic polyps removed and/or treated as described above. Pancolonic diverticulosis. Bx-tubular adenomas, rectal hyperplastic polyp. next colonoscopy in 06/2015.  [name] COLONOSCOPY N/A 06/22/2016   Procedure: COLONOSCOPY;  Surgeon: Aviva Signs, MD;  Location: [facility];  Service: Gastroenterology;  Laterality: N/A;  730  . CORONARY ANGIOPLASTY WITH STENT PLACEMENT    . CORONARY ARTERY BYPASS GRAFT  1996   LIMA to LAD, SVG to D2, SVG to PDA, SVG to OM1 and OM2  . ESOPHAGOGASTRODUODENOSCOPY N/A 03/16/2013   Procedure: ESOPHAGOGASTRODUODENOSCOPY (EGD);  Surgeon: Daneil Dolin, MD;  Location: [facility];  Service: Endoscopy;  Laterality: N/A;  12:00-moved to [place] notified pt  . ESOPHAGOGASTRODUODENOSCOPY N/A 03/06/2013   Procedure: ESOPHAGOGASTRODUODENOSCOPY (EGD);  Surgeon: Daneil Dolin, MD;  Location: [facility];  Service: Endoscopy;  Laterality: N/A;  . HERNIA REPAIR      Family History  Problem Relation Age of Onset  . Heart disease    . Colon cancer Neg Hx      Social History Mr. Vanwie reports that he quit smoking about 22 years ago. His smoking use included Cigarettes. He has a 80.00 pack-year smoking history. He has never used smokeless tobacco. Mr. Hawley reports that he does not drink alcohol.  Review of Systems Complete review of systems are found to be negative unless outlined in H&P above.  Physical Examination Blood pressure (!) 106/43, pulse (!) 40, temperature 97.9 F (36.6 C), resp. rate 17, height 5\' 10"  (1.778 m), weight 201 lb 4.5 oz (91.3 kg), SpO2 98 %.  Intake/Output Summary (Last 24 hours) at  02/14/17 0729 Last data filed at 02/13/17 1300  Gross per 24 hour  Intake              480 ml  Output                0 ml  Net              480 ml    Telemetry: Sinus bradycardia, pauses are noted at 3.46 and 3.47 seconds. Heart rates between 20 and 50 bpm.  GEN: No acute distress HEENT: Conjunctiva and lids normal, oropharynx clear with moist mucosa. Neck: Supple, no elevated JVP or carotid bruits, no thyromegaly. Lungs: Clear to auscultation, nonlabored breathing at rest. Cardiac: Regular rate and rhythm, bradycardic, no  systolic murmur, no pericardial rub. Abdomen: Soft, nontender, no hepatomegaly, bowel sounds present, no guarding or rebound. Extremities: No pitting edema, distal pulses 2+. Skin: Warm and dry. Musculoskeletal: No kyphosis. Neuropsychiatric: Alert and oriented x3, affect grossly appropriate. Insistent on going home.   Prior Cardiac Testing/Procedures 1. Echocardiogram 06/28/2014 Left ventricle: Systolic function is low normal, EF 50%. The cavity size was mildly dilated. Wall thickness was increased in  a pattern of mild LVH. There was an increased relative contribution of atrial contraction to ventricular filling. Doppler parameters are consistent with abnormal left ventricular relaxation (grade 1 diastolic dysfunction). Doppler parameters are consistent with high ventricular filling pressure. - Aortic valve: Mildly calcified annulus. Trileaflet; mildly thickened leaflets. There was no stenosis. - Aorta: Borderline aortic root dilatation. 3.9 cm. - Mitral valve: There was moderate regurgitation. - Left atrium: The atrium was mildly dilated. - Right ventricle: Systolic function was mildly reduced. - Tricuspid valve: There was mild-moderate regurgitation. - Pulmonary arteries: PA peak pressure: 31 mm Hg (S).  NM Study 12/28/2016  Inferior defect that shows partial improvement consistent with scar/soft tissue attenuation and periinfarct  ischemia  LVEF 46%  Intermediate risk study  Cardiac Cath 01/12/ 2006  : CONCLUSION: This study demonstrates severe native three vessel coronary artery disease with patent saphenous vein graft to the PDA, patent saphenous vein graft to the obtuse marginal, and patent saphenous vein graft to the diagonal branches. It also demonstrates patent but atretic LIMA to the LAD. There was a 90% PLSA stenosis down stream of the saphenous vein graft to the PDA which could certainly be causing the patient's symptoms. Overall, the patient's LV function was preserved.  Lab Results  Basic Metabolic Panel:  Recent Labs Lab 02/12/17 1319 02/13/17 0525 02/14/17 0510  NA 137 138 140  K 3.5 3.8 3.8  CL 98* 100* 99*  CO2 33* 31 34*  GLUCOSE 187* 105* 103*  BUN 12 12 15   CREATININE 0.93 0.85 0.79  CALCIUM 8.3* 8.6* 8.6*  MG 2.0  --   --     CBC:  Recent Labs Lab 02/12/17 1319 02/13/17 0525 02/14/17 0510  WBC 9.2 10.6* 7.1  HGB 12.0* 11.9* 11.2*  HCT 36.1* 36.0* 34.3*  MCV 96.5 96.5 97.2  PLT 196 216 205    Cardiac Enzymes:  Recent Labs Lab 02/12/17 1718 02/12/17 2305 02/13/17 0525  TROPONINI <0.03 <0.03 <0.03    BNP: Invalid input(s): POCBNP   Radiology: Ct Angio Chest Pe W Or Wo Contrast  Result Date: 02/12/2017 CLINICAL DATA:  Acute onset of upper mid sternal chest pain, radiating to the throat and shoulders. Hoarseness. Initial encounter. EXAM: CT ANGIOGRAPHY CHEST WITH CONTRAST TECHNIQUE: Multidetector CT imaging of the chest was performed using the standard protocol during bolus administration of intravenous contrast. Multiplanar CT image reconstructions and MIPs were obtained to evaluate the vascular anatomy. CONTRAST:  100 mL of Isovue 370 IV contrast COMPARISON:  CTA of the chest performed 04/03/2016, and chest radiograph performed earlier today at 1:27 p.m. FINDINGS: Cardiovascular:  There is no evidence of pulmonary embolus. Diffuse coronary artery  calcifications are seen. The patient is status post median sternotomy, with evidence of prior CABG. Scattered calcification noted along the aortic arch, descending thoracic aorta and proximal great vessels. Mediastinum/Nodes: The mediastinum is otherwise unremarkable in appearance. No mediastinal lymphadenopathy is seen. No pericardial effusion is identified. The thyroid gland is unremarkable. No axillary lymphadenopathy is appreciated. Lungs/Pleura: A trace right-sided pleural effusion is noted, with mild associated atelectasis. Minimal emphysema is noted at the lung apices. No pneumothorax is seen. No masses are identified. Upper Abdomen: The visualized portions of the liver and spleen are grossly unremarkable. There is reflux of contrast into the hepatic veins and IVC. The visualized portions of the gallbladder, pancreas and right adrenal gland are within normal limits. Musculoskeletal: No acute osseous abnormalities are identified. The visualized musculature is unremarkable in appearance. Review of the MIP images  confirms the above findings. IMPRESSION: 1. No evidence of pulmonary embolus. 2. Trace right-sided pleural effusion, with mild associated atelectasis. 3. Minimal emphysema at the lung apices. 4. Diffuse coronary artery calcifications seen. 5. Reflux of contrast into the hepatic veins and IVC. Electronically Signed   By: Garald Balding M.D.   On: 02/12/2017 17:31   Dg Chest Port 1 View  Result Date: 02/12/2017 CLINICAL DATA:  Acute chest pain, coronary disease post MI, essential hypertension EXAM: PORTABLE CHEST 1 VIEW COMPARISON:  Portable exam 1327 hours compared to 12/23/2016 FINDINGS: Enlargement of cardiac silhouette post CABG. Mediastinal contours and pulmonary vascularity normal. Minimal atherosclerotic calcification aorta. Mild atelectasis versus infiltrate at RIGHT base. Lungs otherwise clear. No pleural effusion or pneumothorax. Bones demineralized. IMPRESSION: Minimal enlargement of  cardiac silhouette post CABG. Mild atelectasis versus infiltrate at RIGHT base. Electronically Signed   By: Lavonia Dana M.D.   On: 02/12/2017 13:44     ECG: Sinus bradycardia with left bundle branch block. Heart rate 42 bpm.   Impression and Recommendations  1. Sinus bradycardia: Patient has been taken off of carvedilol since admission on 02/12/2017. Had episodes of significant bradycardia with 2 documented pauses at 3.46 seconds and 3.47 seconds over the last 24 hours. He does not report any symptoms associated. He remained soreness of her chest which he is blaming on prior musculoskeletal strain with moving a refrigerator. The patient is anxious to return home. It is unclear if he can make a decision concerning moving forward with pacemaker implantation. His wife is not at bedside. She is supposed to come by this morning. We'll discuss this further with her. Uncertain if the patient will agree to this.  2. History of coronary artery disease: Coronary artery bypass grafting in 1999, follow-up nuclear medicine study in 12/2016, was intermediate risk with soft tissue attenuation noted along with peri-infarct ischemia. No new areas of ischemia were noted. Continue secondary prevention with dual antiplatelet therapy to include Plavix,aspirin, and statin therapy.  3. Hypertension: Blood pressure is soft but stable. He has been taken off of carvedilol, amlodipine, isosorbide, and HCTZ since admission.   4. Dementia: Unclear patient is able to understand fully the need to have pacemaker. He remains on Aricept. This can also contribute to bradycardia.will await family member to arrive to discuss this more fully, and make decisions concerning moving forward with pacemaker implantation or ongoing medical management. The patient is a full code.    Signed: Phill Myron. Lawrence NP [place]  02/14/2017, 7:29 AM Co-Sign MD  The patient was seen and examined, and I agree with the history, physical exam,  assessment and plan as documented above, with modifications as noted below. Pt with aforementioned medical history (CAD/CABG) who presented with chest pain and has been found to be in sinus bradycardia with pauses. Coreg held 2/17 evening. He had an asymptomatic 3.35 sec pause at 8:46 am today. Denies dizziness, lightheadedness, and palpitations. Denies any further chest pain.  Of note, most recent coronary angiogram was in 07/2011 which demonstrated the following: CONCLUSION: 1. Mild reduction of left ventricular function with an inferobasal     wall motion abnormality. 2. Continued patency internal mammary to the LAD. 3. Continued patency of saphenous vein graft to the diagonal. 4. Continued patency of the saphenous vein graft to two branches of     the marginal with severe disease of the marginal system retrograde     as noted in the above text. 5. Progression of disease in the saphenous vein  graft to the posterior     descending branch with successful percutaneous stenting. 6. High-grade stenosis in the limb between the PDA and PLA system,     which has been previously noted.  Note that there are some     collaterals noted also into the PLA system.  Most recent stress test with inferior scar and peri-infarct ischemia in 12/2016 detailed above.  As it has been 48 hrs since his last dose of Coreg and he continues to have HR in primarily 30 bpm range. TSH 3.8 on 2/17.  ECG 2/18 at 0455 shows sinus bradycardia with LBBB, HR 43 bpm.  He says he would like to go home and have a bowel movement and take a shower. Says he has a monitor at home and would like to spend at least one night at home.  He has dementia and had been on Aricept which has been held (can cause bradycardia in 1% of patients).  He will likely require pacemaker placement. EP may decide to have him undergo coronary angiography first.  I will have a discussion with his wife as well.   Kate Sable, MD,  [facility]  ADDENDUM: I had a discussion with the patient's wife, patient, and family members. It was agreed to transferring to [facility] for further evaluation by the EP service would be most beneficial. We will make arrangements to do so today.  02/14/2017 9:47 AM

## 2017-02-15 DIAGNOSIS — R001 Bradycardia, unspecified: Principal | ICD-10-CM

## 2017-02-15 DIAGNOSIS — F0280 Dementia in other diseases classified elsewhere without behavioral disturbance: Secondary | ICD-10-CM

## 2017-02-15 DIAGNOSIS — I10 Essential (primary) hypertension: Secondary | ICD-10-CM | POA: Diagnosis not present

## 2017-02-15 DIAGNOSIS — I5032 Chronic diastolic (congestive) heart failure: Secondary | ICD-10-CM | POA: Diagnosis not present

## 2017-02-15 DIAGNOSIS — R079 Chest pain, unspecified: Secondary | ICD-10-CM | POA: Diagnosis not present

## 2017-02-15 DIAGNOSIS — E039 Hypothyroidism, unspecified: Secondary | ICD-10-CM

## 2017-02-15 DIAGNOSIS — I110 Hypertensive heart disease with heart failure: Secondary | ICD-10-CM | POA: Diagnosis not present

## 2017-02-15 DIAGNOSIS — G894 Chronic pain syndrome: Secondary | ICD-10-CM | POA: Diagnosis not present

## 2017-02-15 DIAGNOSIS — J439 Emphysema, unspecified: Secondary | ICD-10-CM | POA: Diagnosis not present

## 2017-02-15 DIAGNOSIS — K5900 Constipation, unspecified: Secondary | ICD-10-CM | POA: Diagnosis not present

## 2017-02-15 DIAGNOSIS — L8990 Pressure ulcer of unspecified site, unspecified stage: Secondary | ICD-10-CM | POA: Insufficient documentation

## 2017-02-15 DIAGNOSIS — D649 Anemia, unspecified: Secondary | ICD-10-CM | POA: Diagnosis not present

## 2017-02-15 DIAGNOSIS — G309 Alzheimer's disease, unspecified: Secondary | ICD-10-CM | POA: Diagnosis not present

## 2017-02-15 DIAGNOSIS — I2510 Atherosclerotic heart disease of native coronary artery without angina pectoris: Secondary | ICD-10-CM | POA: Diagnosis not present

## 2017-02-15 LAB — CBC
HCT: 34.2 % — ABNORMAL LOW (ref 39.0–52.0)
Hemoglobin: 10.7 g/dL — ABNORMAL LOW (ref 13.0–17.0)
MCH: 30.5 pg (ref 26.0–34.0)
MCHC: 31.3 g/dL (ref 30.0–36.0)
MCV: 97.4 fL (ref 78.0–100.0)
PLATELETS: 201 10*3/uL (ref 150–400)
RBC: 3.51 MIL/uL — AB (ref 4.22–5.81)
RDW: 13.7 % (ref 11.5–15.5)
WBC: 6.3 10*3/uL (ref 4.0–10.5)

## 2017-02-15 LAB — IRON AND TIBC
Iron: 33 ug/dL — ABNORMAL LOW (ref 45–182)
SATURATION RATIOS: 13 % — AB (ref 17.9–39.5)
TIBC: 245 ug/dL — ABNORMAL LOW (ref 250–450)
UIBC: 212 ug/dL

## 2017-02-15 LAB — FERRITIN: FERRITIN: 112 ng/mL (ref 24–336)

## 2017-02-15 NOTE — Progress Notes (Signed)
Paged the oncall for patient HR dropping to low 30's. Diastolic Bp low. EKG done HR 44. Patient asymptomatic.

## 2017-02-15 NOTE — Discharge Summary (Signed)
Physician Discharge Summary  Dustin [name] [id number] DOB: 03-17-1934 DOA: 02/12/2017  PCP: Glo Herring., MD  Admit date: 02/12/2017 Discharge date: 02/15/2017   Recommendations for Outpatient Follow-Up:   1. Stopped aricept/coreg dur to low HR 2. Outpatient cardiac follow up 3. Anemia work up follow up 4. Bowel regimen as patient on chronic pain meds   Discharge Diagnosis:   Active Problems:   Hyperlipidemia   ALZHEIMERS DISEASE   HYPERTENSION, BENIGN   Coronary artery disease   Chest pain   Hypothyroidism   Sinus bradycardia   Chronic diastolic CHF (congestive heart failure) [facility])   Discharge disposition:  Home.  Discharge Condition: Improved.  Diet recommendation: Low sodium, heart healthy.  Wound care: None.   History of Present Illness:   Dustin [name] is a 83 y.o. male   With h/o mild dementia, HTN, CAD s/p CABG, s/p stents, last echo EF 50% presented to the ED with c/o chest pain. He reported intermittent chest pain start from upper mid sternum radiation up to his throat and bilateral shoulder, bilateral ear, he described "someone has hit me in the chest with a sledgehammer." he report pain get worse with taking deep breath. he report he has a recent stress test , he has not hear back from his cardiologist. ( wife is not at bedside, not sure if the story is reliable), he also report being hoarse for a week, denies dysphagia, no odynophagia, no cough, no fever, no lower extremity edema, he report at baseline he is active, still drives.  ED course: he has sinus bradycardia on tele, otherwise bp stable, on room air, no hypoxia, cxr /ekg no acute findings, first troponin negative. Cbc at baseline, bmp with normal cr . He is given asa/nitroglycerin in the ED, he is also given morphine in the ED, patient report pain did not improved with asa/nitro but improved with nitro.  hospitalist called to admit patient under observation status for chest pain rule  out.   Hospital Course by Problem:   Sinus bradycardia.  -coreg was discontinued 2/17.  -TSH is normal. -HR increase with activity  -seen by EP-- no current need for intervention- stop coreg and aricept  Chest pain. Patient ruled out for ACS with negative cardiac markers. Pain does appear to be atypical, likley musculoskeletal. CT chest was negative for pulmonary embolus. He does not have any further symptoms.  Hypertension. Blood pressure has been low normal  Coronary artery disease status post CABG . patient had stress test in 12/2016 that did not show any reversible ischemia. No further chest pain at this time. Continue medical management.  Hypothyroidism. Continue Synthroid. TSH normal.  Chronic pain syndrome. Continue fentanyl patch and oxycodone. Bearing down from constipation may worsen his bradycardia- vasovagal. - Miralax and senakot, doculax.  Dementia. Aricept currently on hold due to issues of bradycardia.  Mild anemia- Baseline- 11-12. Will check ferritin, serum iron and saturations- defer to PCP    Medical Consultants:    Cards  EP   Discharge Exam:   Vitals:   02/15/17 0130 02/15/17 0427  BP: (!) 106/43 (!) 103/46  Pulse: 97 (!) 45  Resp:  20  Temp:  97.7 F (36.5 C)   Vitals:   02/14/17 1532 02/14/17 2009 02/15/17 0130 02/15/17 0427  BP: (!) 135/53 (!) 131/37 (!) 106/43 (!) 103/46  Pulse: 90 (!) 50 97 (!) 45  Resp: 16 20  20   Temp: 98.1 F (36.7 C) 97.9 F (36.6 C)  97.7 F (  36.5 C)  TempSrc: Oral Oral  Oral  SpO2: 96% 95% 92% 94%  Weight: 90.8 kg (200 lb 3.2 oz)     Height: 5\' 9"  (1.753 m)       Gen:  NAD    The results of significant diagnostics from this hospitalization (including imaging, microbiology, ancillary and laboratory) are listed below for reference.     Procedures and Diagnostic Studies:   Ct Angio Chest Pe W Or Wo Contrast  Result Date: 02/12/2017 CLINICAL DATA:  Acute onset of upper mid sternal chest pain,  radiating to the throat and shoulders. Hoarseness. Initial encounter. EXAM: CT ANGIOGRAPHY CHEST WITH CONTRAST TECHNIQUE: Multidetector CT imaging of the chest was performed using the standard protocol during bolus administration of intravenous contrast. Multiplanar CT image reconstructions and MIPs were obtained to evaluate the vascular anatomy. CONTRAST:  100 mL of Isovue 370 IV contrast COMPARISON:  CTA of the chest performed 04/03/2016, and chest radiograph performed earlier today at 1:27 p.m. FINDINGS: Cardiovascular:  There is no evidence of pulmonary embolus. Diffuse coronary artery calcifications are seen. The patient is status post median sternotomy, with evidence of prior CABG. Scattered calcification noted along the aortic arch, descending thoracic aorta and proximal great vessels. Mediastinum/Nodes: The mediastinum is otherwise unremarkable in appearance. No mediastinal lymphadenopathy is seen. No pericardial effusion is identified. The thyroid gland is unremarkable. No axillary lymphadenopathy is appreciated. Lungs/Pleura: A trace right-sided pleural effusion is noted, with mild associated atelectasis. Minimal emphysema is noted at the lung apices. No pneumothorax is seen. No masses are identified. Upper Abdomen: The visualized portions of the liver and spleen are grossly unremarkable. There is reflux of contrast into the hepatic veins and IVC. The visualized portions of the gallbladder, pancreas and right adrenal gland are within normal limits. Musculoskeletal: No acute osseous abnormalities are identified. The visualized musculature is unremarkable in appearance. Review of the MIP images confirms the above findings. IMPRESSION: 1. No evidence of pulmonary embolus. 2. Trace right-sided pleural effusion, with mild associated atelectasis. 3. Minimal emphysema at the lung apices. 4. Diffuse coronary artery calcifications seen. 5. Reflux of contrast into the hepatic veins and IVC. Electronically Signed    By: Garald Balding M.D.   On: 02/12/2017 17:31   Dg Chest Port 1 View  Result Date: 02/12/2017 CLINICAL DATA:  Acute chest pain, coronary disease post MI, essential hypertension EXAM: PORTABLE CHEST 1 VIEW COMPARISON:  Portable exam 1327 hours compared to 12/23/2016 FINDINGS: Enlargement of cardiac silhouette post CABG. Mediastinal contours and pulmonary vascularity normal. Minimal atherosclerotic calcification aorta. Mild atelectasis versus infiltrate at RIGHT base. Lungs otherwise clear. No pleural effusion or pneumothorax. Bones demineralized. IMPRESSION: Minimal enlargement of cardiac silhouette post CABG. Mild atelectasis versus infiltrate at RIGHT base. Electronically Signed   By: Lavonia Dana M.D.   On: 02/12/2017 13:44     Labs:   Basic Metabolic Panel:  Recent Labs Lab 02/12/17 1319 02/13/17 0525 02/14/17 0510  NA 137 138 140  K 3.5 3.8 3.8  CL 98* 100* 99*  CO2 33* 31 34*  GLUCOSE 187* 105* 103*  BUN 12 12 15   CREATININE 0.93 0.85 0.79  CALCIUM 8.3* 8.6* 8.6*  MG 2.0  --   --    GFR Estimated Creatinine Clearance: 82 mL/min (by C-G formula based on SCr of 0.79 mg/dL). Liver Function Tests: No results for input(s): AST, ALT, ALKPHOS, BILITOT, PROT, ALBUMIN in the last 168 hours. No results for input(s): LIPASE, AMYLASE in the last 168 hours. No results  for input(s): AMMONIA in the last 168 hours. Coagulation profile No results for input(s): INR, PROTIME in the last 168 hours.  CBC:  Recent Labs Lab 02/12/17 1319 02/13/17 0525 02/14/17 0510 02/15/17 0322  WBC 9.2 10.6* 7.1 6.3  HGB 12.0* 11.9* 11.2* 10.7*  HCT 36.1* 36.0* 34.3* 34.2*  MCV 96.5 96.5 97.2 97.4  PLT 196 216 205 201   Cardiac Enzymes:  Recent Labs Lab 02/12/17 1718 02/12/17 2305 02/13/17 0525  TROPONINI <0.03 <0.03 <0.03   BNP: Invalid input(s): POCBNP CBG: No results for input(s): GLUCAP in the last 168 hours. D-Dimer No results for input(s): DDIMER in the last 72 hours. Hgb  A1c No results for input(s): HGBA1C in the last 72 hours. Lipid Profile No results for input(s): CHOL, HDL, LDLCALC, TRIG, CHOLHDL, LDLDIRECT in the last 72 hours. Thyroid function studies  Recent Labs  02/12/17 1718  TSH 3.893   Anemia work up  Recent Labs  02/15/17 0322  FERRITIN 112  TIBC 245*  IRON 33*   Microbiology No results found for this or any previous visit (from the past 240 hour(s)).   Discharge Instructions:   Discharge Instructions    Diet - low sodium heart healthy    Complete by:  As directed    Discharge instructions    Complete by:  As directed    Stopping coreg/aricept due to low heart rate Resume PRN O2   Increase activity slowly    Complete by:  As directed      Allergies as of 02/15/2017   No Known Allergies     Medication List    STOP taking these medications   amLODipine 10 MG tablet Commonly known as:  NORVASC   carvedilol 3.125 MG tablet Commonly known as:  COREG   donepezil 10 MG tablet Commonly known as:  ARICEPT   hydrochlorothiazide 12.5 MG capsule Commonly known as:  MICROZIDE     TAKE these medications   ALPRAZolam 1 MG tablet Commonly known as:  XANAX Take 1 tablet (1 mg total) by mouth 2 (two) times daily. For anxiety/sleep What changed:  how much to take  when to take this  reasons to take this  additional instructions   clopidogrel 75 MG tablet Commonly known as:  PLAVIX Take 1 tablet (75 mg total) by mouth daily.   escitalopram 10 MG tablet Commonly known as:  LEXAPRO Take 10 mg by mouth daily.   fentaNYL 50 MCG/HR Commonly known as:  DURAGESIC - dosed mcg/hr 1 patch every other day.   furosemide 20 MG tablet Commonly known as:  LASIX Take 20 mg by mouth daily.   gabapentin 300 MG capsule Commonly known as:  NEURONTIN Take 1 capsule (300 mg total) by mouth 3 (three) times daily.   isosorbide mononitrate 30 MG 24 hr tablet Commonly known as:  IMDUR Take 30 mg by mouth daily.    levothyroxine 50 MCG tablet Commonly known as:  SYNTHROID, LEVOTHROID Take 50 mcg by mouth daily.   memantine 10 MG tablet Commonly known as:  NAMENDA Take 1 tablet by mouth 2 (two) times daily.   nitroGLYCERIN 0.4 MG SL tablet Commonly known as:  NITROSTAT PLACE ONE TABLET UNDER TONGUE EVERY 5 MIN UP TO 3 DOSES AS NEEDED FORCHEST PAIN.   oxycodone 30 MG immediate release tablet Commonly known as:  ROXICODONE Take 15 mg by mouth 3 (three) times daily.   pravastatin 40 MG tablet Commonly known as:  PRAVACHOL TAKE (1) TABLET BY MOUTH AT BEDTIME.  tamsulosin 0.4 MG Caps capsule Commonly known as:  FLOMAX Take 1 capsule by mouth daily.         Time coordinating discharge: 35 min  Signed:  JESSICA U VANN   [name] 02/15/2017, 8:10 AM

## 2017-02-15 NOTE — Progress Notes (Signed)
Spoke with cardiology NP Renee and Dr. Eliseo Squires concerning questionable understanding from patient on what symptomatic bradycardia involved.  Patient stated he had episodes of dizziness at home and at hospital.  Patient seemed to feel he could not remember but his wife reminded him that he had been complaining of this.  Patient stated that he "quit going to church because he got weak and was falling all over people".  I spoke with patient and wife at length and felt it prudent to re-address concerns with family.  All questions and concerns were addressed and patient/family were OK with follow up.  Payton Emerald, RN

## 2017-02-15 NOTE — Progress Notes (Signed)
Pt/family given discharge instructions, medication lists, follow up appointments, and when to call the doctor.  Pt/family verbalizes understanding. Patient and family asked multiple questions concerning heart rate and follow up appointments. Pt transported to main entrance for discharge. Payton Emerald, RN

## 2017-02-15 NOTE — Progress Notes (Signed)
SUBJECTIVE: The patient is doing well today. He denies dizziness, presyncope or syncope.  With ambulation yesterday, heart rates appropriately increased. He was mostly limited by neuropathy.  At this time, he denies any ongoing chest pain, no shortness of breath, or any new concerns.  . bisacodyl  10 mg Rectal Once  . clopidogrel  75 mg Oral Daily  . enoxaparin (LOVENOX) injection  40 mg Subcutaneous Q24H  . escitalopram  10 mg Oral Daily  . famotidine  20 mg Oral BID  . fentaNYL  50 mcg Transdermal Q48H  . fluticasone  1 spray Each Nare Daily  . furosemide  20 mg Oral Daily  . gabapentin  300 mg Oral TID  . isosorbide mononitrate  30 mg Oral Daily  . levothyroxine  50 mcg Oral QAC breakfast  . memantine  10 mg Oral BID  . polyethylene glycol  17 g Oral BID  . potassium chloride  40 mEq Oral Once  . pravastatin  40 mg Oral q1800  . senna-docusate  2 tablet Oral BID  . sodium chloride flush  3 mL Intravenous Q12H  . tamsulosin  0.4 mg Oral Daily     OBJECTIVE: Physical Exam: Vitals:   02/14/17 1532 02/14/17 2009 02/15/17 0130 02/15/17 0427  BP: (!) 135/53 (!) 131/37 (!) 106/43 (!) 103/46  Pulse: 90 (!) 50 97 (!) 45  Resp: 16 20  20   Temp: 98.1 F (36.7 C) 97.9 F (36.6 C)  97.7 F (36.5 C)  TempSrc: Oral Oral  Oral  SpO2: 96% 95% 92% 94%  Weight: 200 lb 3.2 oz (90.8 kg)     Height: 5\' 9"  (1.753 m)       Intake/Output Summary (Last 24 hours) at 02/15/17 0756 Last data filed at 02/15/17 0350  Gross per 24 hour  Intake              243 ml  Output              200 ml  Net               43 ml    Telemetry is reviewed reveals sinus bradycardia 40s.  With ambulation heart rates increased briskly into the 60s.  GEN- The patient is well appearing, alert and oriented x 3 today.   Head- normocephalic, atraumatic Eyes-  Sclera clear, conjunctiva pink Ears- hearing intact Oropharynx- clear Neck- supple, no JVP Lungs- Clear to ausculation bilaterally, normal work of  breathing Heart- Regular rate and rhythm, bradycardic, no significant murmurs, no rubs or gallops GI- soft, NT, ND Extremities- no clubbing, cyanosis, or edema Skin- no rash or lesion Psych- euthymic mood, full affect Neuro- no gross deficits appreciated  LABS: Basic Metabolic Panel:  Recent Labs  02/12/17 1319 02/13/17 0525 02/14/17 0510  NA 137 138 140  K 3.5 3.8 3.8  CL 98* 100* 99*  CO2 33* 31 34*  GLUCOSE 187* 105* 103*  BUN 12 12 15   CREATININE 0.93 0.85 0.79  CALCIUM 8.3* 8.6* 8.6*  MG 2.0  --   --    CBC:  Recent Labs  02/14/17 0510 02/15/17 0322  WBC 7.1 6.3  HGB 11.2* 10.7*  HCT 34.3* 34.2*  MCV 97.2 97.4  PLT 205 201   Cardiac Enzymes:  Recent Labs  02/12/17 1718 02/12/17 2305 02/13/17 0525  TROPONINI <0.03 <0.03 <0.03   Thyroid Function Tests:  Recent Labs  02/12/17 1718  TSH 3.893   Anemia Panel:  Recent Labs  02/15/17 0322  FERRITIN 112  TIBC 245*  IRON 33*    ASSESSMENT AND PLAN:  1. Bradycardia     Asymptomatic both at rest and with ambulation      Nursing note is appreciated, patient was ambulated yesterday with increase in HR and no symptoms     Off BB, aricept     D/w night RN, the patient with slower rates observed at times he was asleep (and noted he snores) and easily woken, at times awake quietly watching TV without symptoms     Dr. Rayann Heman saw the patient today, impression is that his primary limitations are his neuropathy pain, and advancing age musculoskeletal aches/pains     There are no reports of syncope, or symptoms of bradycardia note in the chart or elicited from the patient      At this time no pacemaker implant felt needed/indicated without symptoms, would keep off the Coreg and avoid any rate limiting/nodal locking agents      Follow up with Dr. Domenic Polite in the next couple weeks   Tommye Standard, PA-C 02/15/2017 7:56 AM  I have seen, examined the patient, and reviewed the above assessment and plan. On exam,  bradycardic regular rhythm.  Changes to above are made where necessary.  Given paucity of symptoms and improvement in heart rates with ambulation, I would not advise pacing at this time. Ok to discharge from EP standpoint off of coreg and aricept.   Follow-up with general cardiology in the office.  Electrophysiology team to see as needed while here. Please call with questions.   Co Sign: Thompson Grayer, MD 02/15/2017 9:30 AM

## 2017-03-01 ENCOUNTER — Ambulatory Visit (INDEPENDENT_AMBULATORY_CARE_PROVIDER_SITE_OTHER): Payer: PPO

## 2017-03-01 ENCOUNTER — Ambulatory Visit (INDEPENDENT_AMBULATORY_CARE_PROVIDER_SITE_OTHER): Payer: PPO | Admitting: Adult Health

## 2017-03-01 ENCOUNTER — Encounter: Payer: Self-pay | Admitting: Adult Health

## 2017-03-01 VITALS — BP 104/64 | HR 66 | Ht 69.0 in | Wt 197.0 lb

## 2017-03-01 DIAGNOSIS — R001 Bradycardia, unspecified: Secondary | ICD-10-CM

## 2017-03-01 DIAGNOSIS — I10 Essential (primary) hypertension: Secondary | ICD-10-CM | POA: Diagnosis not present

## 2017-03-01 DIAGNOSIS — I959 Hypotension, unspecified: Secondary | ICD-10-CM | POA: Diagnosis not present

## 2017-03-01 DIAGNOSIS — I5032 Chronic diastolic (congestive) heart failure: Secondary | ICD-10-CM | POA: Diagnosis not present

## 2017-03-01 DIAGNOSIS — R0989 Other specified symptoms and signs involving the circulatory and respiratory systems: Secondary | ICD-10-CM

## 2017-03-01 DIAGNOSIS — Z6827 Body mass index (BMI) 27.0-27.9, adult: Secondary | ICD-10-CM | POA: Diagnosis not present

## 2017-03-01 DIAGNOSIS — Z1389 Encounter for screening for other disorder: Secondary | ICD-10-CM | POA: Diagnosis not present

## 2017-03-01 DIAGNOSIS — R079 Chest pain, unspecified: Secondary | ICD-10-CM | POA: Diagnosis not present

## 2017-03-01 DIAGNOSIS — E063 Autoimmune thyroiditis: Secondary | ICD-10-CM | POA: Diagnosis not present

## 2017-03-01 DIAGNOSIS — I2510 Atherosclerotic heart disease of native coronary artery without angina pectoris: Secondary | ICD-10-CM | POA: Diagnosis not present

## 2017-03-01 NOTE — Progress Notes (Signed)
Cardiology Office Note   Date:  03/01/2017   ID:  SULEMAN WOOLS, DOB 1934-10-18, MRN [id number]  PCP:  Glo Herring., MD  Cardiologist: [place]/  Jory Sims, NP   Chief Complaint  Patient presents with  . Coronary Artery Disease  . Hypertension      History of Present Illness: Dustin [name] is a 83 y.o. male who presents for ongoing assessment and management of coronary artery disease, history of CABG in 1996, hypertension, hypothyroidism, history of obstructive sleep apnea and dementia. The patient had a stress test on 12/28/2016 revealing inferior defect, partial improvement consistent with scar and soft tissue attenuation, and peri-infarct ischemia. LVEF of 46%. Was found to be an intermediate risk study. The patient did not follow-up in the office post test.  The patient presented to the emergency room on 02/12/2017 with complaints of intermittent chest discomfort radiating from his midsternum into his throat and bilateral shoulders. Worse with inspiration. The patient is also found to be bradycardic, with 2 documented pauses of 3.46 seconds and 3.45 seconds during recent hospitalization. Discussion for need for pacemaker insertion was discussed with the patient.   The patient was transferred to [facility] and evaluated by Dr. Curt Bears, electrophysiologist. The patient's heart rate normalized after several days off of beta blocker. Nurses ambulated him and heart rate rose appropriately. He therefore was not found to be a candidate for pacemaker insertion. He was discharged home on 02/15/2017 and is here for post hospitalization follow-up  He comes today fixated on being allowed to drive again. His primary care physician has not allowed him to do so and has signed documents  to that effect. The patient report dementia is the reason he is no longer allowed to drive. He denies dizziness chest pain or dyspnea.  Past Medical History:  Diagnosis Date  . Alzheimer disease   .  Anxiety   . Arthritis   . Atrophic gastritis    a. By EGD 02/2013.  [name] Coronary atherosclerosis of native coronary artery    a. Multivessel s/p CABG 1996. b. s/p DES x 2 SVG to PDA 8/12 with distal disease managed medically.  . DDD (degenerative disc disease)    Chronic back pain  . Enlarged prostate   . Essential hypertension, benign   . Hematuria   . Hypothyroidism   . MI (myocardial infarction)   . Mixed hyperlipidemia   . OA (osteoarthritis)   . OSA (obstructive sleep apnea)     Past Surgical History:  Procedure Laterality Date  . COLONOSCOPY  08/03/2004   Jenkins-numerous large diverticula in the descending, transverse, descending, and sigmoid colon. Otherwise normal exam.  . COLONOSCOPY  07/12/2012   RMR: External hemorrhoidal tag; multiple rectal and colonic polyps removed and/or treated as described above. Pancolonic diverticulosis. Bx-tubular adenomas, rectal hyperplastic polyp. next colonoscopy in 06/2015.  [name] COLONOSCOPY N/A 06/22/2016   Procedure: COLONOSCOPY;  Surgeon: Aviva Signs, MD;  Location: [facility];  Service: Gastroenterology;  Laterality: N/A;  730  . CORONARY ANGIOPLASTY WITH STENT PLACEMENT    . CORONARY ARTERY BYPASS GRAFT  1996   LIMA to LAD, SVG to D2, SVG to PDA, SVG to OM1 and OM2  . ESOPHAGOGASTRODUODENOSCOPY N/A 03/16/2013   Procedure: ESOPHAGOGASTRODUODENOSCOPY (EGD);  Surgeon: Daneil Dolin, MD;  Location: [facility];  Service: Endoscopy;  Laterality: N/A;  12:00-moved to [place] notified pt  . ESOPHAGOGASTRODUODENOSCOPY N/A 03/06/2013   Procedure: ESOPHAGOGASTRODUODENOSCOPY (EGD);  Surgeon: Daneil Dolin, MD;  Location: [facility];  Service: Endoscopy;  Laterality: N/A;  . HERNIA REPAIR       Current Outpatient Prescriptions  Medication Sig Dispense Refill  . ALPRAZolam (XANAX) 1 MG tablet Take 1 tablet (1 mg total) by mouth 2 (two) times daily. For anxiety/sleep (Patient taking differently: Take 1.5 mg by mouth at bedtime as needed.  For anxiety/sleep) 30 tablet 0  . clopidogrel (PLAVIX) 75 MG tablet Take 1 tablet (75 mg total) by mouth daily.    [name] escitalopram (LEXAPRO) 10 MG tablet Take 10 mg by mouth daily.    . fentaNYL (DURAGESIC - DOSED MCG/HR) 50 MCG/HR 1 patch every other day.    . furosemide (LASIX) 20 MG tablet Take 20 mg by mouth daily.     [name] gabapentin (NEURONTIN) 300 MG capsule Take 1 capsule (300 mg total) by mouth 3 (three) times daily. 90 capsule 0  . isosorbide mononitrate (IMDUR) 30 MG 24 hr tablet Take 30 mg by mouth daily.    [name] levothyroxine (SYNTHROID, LEVOTHROID) 50 MCG tablet Take 50 mcg by mouth daily.    . memantine (NAMENDA) 10 MG tablet Take 1 tablet by mouth 2 (two) times daily.    . nitroGLYCERIN (NITROSTAT) 0.4 MG SL tablet PLACE ONE TABLET UNDER TONGUE EVERY 5 MIN UP TO 3 DOSES AS NEEDED FORCHEST PAIN. 25 tablet 3  . oxycodone (ROXICODONE) 30 MG immediate release tablet Take 15 mg by mouth 3 (three) times daily.    . pravastatin (PRAVACHOL) 40 MG tablet TAKE (1) TABLET BY MOUTH AT BEDTIME. 15 tablet 0  . tamsulosin (FLOMAX) 0.4 MG CAPS capsule Take 1 capsule by mouth daily.     No current facility-administered medications for this visit.     Allergies:   Patient has no known allergies.    Social History:  The patient  reports that he quit smoking about 22 years ago. His smoking use included Cigarettes. He has a 80.00 pack-year smoking history. He has never used smokeless tobacco. He reports that he does not drink alcohol or use drugs.   Family History:  The patient's family history is not on file.    ROS: All other systems are reviewed and negative. Unless otherwise mentioned in H&P    PHYSICAL EXAM: VS:  BP 104/64   Pulse 66   Ht 5\' 9"  (1.753 m)   Wt 197 lb (89.4 kg)   SpO2 94%   BMI 29.09 kg/m  , BMI Body mass index is 29.09 kg/m. GEN: Well nourished, well developed, in no acute distress  HEENT: normal  Neck: no JVD, right carotid bruits, or masses Cardiac: RRR; no murmurs,  rubs, or gallops,no edema  Respiratory:  clear to auscultation bilaterally, normal work of breathing GI: soft, nontender, nondistended, + BS MS: no deformity or atrophy  Skin: warm and dry, no rash Neuro:  Strength and sensation are intact Psych: euthymic mood, full affect   Recent Labs: 04/03/2016: B Natriuretic Peptide 173.0 02/12/2017: Magnesium 2.0; TSH 3.893 02/14/2017: BUN 15; Creatinine, Ser 0.79; Potassium 3.8; Sodium 140 02/15/2017: Hemoglobin 10.7; Platelets 201    Lipid Panel    Component Value Date/Time   CHOL 127 06/29/2014 0308   TRIG 143 06/29/2014 0308   HDL 40 06/29/2014 0308   CHOLHDL 3.2 06/29/2014 0308   VLDL 29 06/29/2014 0308   LDLCALC 58 06/29/2014 0308      Wt Readings from Last 3 Encounters:  03/01/17 197 lb (89.4 kg)  02/14/17 200 lb 3.2 oz (90.8 kg)  12/25/16  198 lb 10.2 oz (90.1 kg)      Other studies Reviewed:  ASSESSMENT AND PLAN:  1.  Bradycardia:  He has been taken off of BB and Aricept. No complaints of dizziness or chest pain. He wants to drive again. Will need to have 7 day cardiac monitor to evaluate HR at home. If, there are no pauses or significant bradycardia, may be able to allow to drive from cardiac standpoint only. Other evaluations related to driving will be deferred to PCP.   2. Right Carotid Bruit: Will have doppler ultrasound of carotid arteries completed. Last ultrasound was in 2015 and did not reveal any carotid artery stenosis.   3. Hypertension: Blood pressure is currently controlled. No changes in medication regimen. Check echo, as last one was completed in 2015 for ongoing assessment.   Current medicines are reviewed at length with the patient today.    Labs/ tests ordered today include:   Orders Placed This Encounter  Procedures  . US Carotid Duplex Bilateral  . Cardiac event monitor  . ECHOCARDIOGRAM COMPLETE     Disposition:   FU with  2-3 weeks to discuss test results.  Signed, Jory Sims, NP   03/01/2017 5:17 PM    [facility] [address] [place], [place] [address] Phone: [phone]; Fax: [phone]

## 2017-03-01 NOTE — Progress Notes (Signed)
Name: Dustin [name]    DOB: 1934/08/06  Age: 83 y.o.  MR#: [id number]       PCP:  Glo Herring., MD      Insurance: Payor: Tennis Must / Plan: Tennis Must / Product Type: *No Product type* /   CC:   No chief complaint on file.   VS Vitals:   03/01/17 1435  Pulse: 66  SpO2: 94%  Weight: 197 lb (89.4 kg)  Height: 5\' 9"  (1.753 m)    Weights Current Weight  03/01/17 197 lb (89.4 kg)  02/14/17 200 lb 3.2 oz (90.8 kg)  12/25/16 198 lb 10.2 oz (90.1 kg)    Blood Pressure  BP Readings from Last 3 Encounters:  02/15/17 (!) 123/44  12/25/16 (!) 119/47  06/22/16 (!) 116/55     Admit date:  (Not on file) Last encounter with RMR:  01/07/2017   Allergy Patient has no known allergies.  Current Outpatient Prescriptions  Medication Sig Dispense Refill  . ALPRAZolam (XANAX) 1 MG tablet Take 1 tablet (1 mg total) by mouth 2 (two) times daily. For anxiety/sleep (Patient taking differently: Take 1.5 mg by mouth at bedtime as needed. For anxiety/sleep) 30 tablet 0  . clopidogrel (PLAVIX) 75 MG tablet Take 1 tablet (75 mg total) by mouth daily.    [name] escitalopram (LEXAPRO) 10 MG tablet Take 10 mg by mouth daily.    . fentaNYL (DURAGESIC - DOSED MCG/HR) 50 MCG/HR 1 patch every other day.    . furosemide (LASIX) 20 MG tablet Take 20 mg by mouth daily.     [name] gabapentin (NEURONTIN) 300 MG capsule Take 1 capsule (300 mg total) by mouth 3 (three) times daily. 90 capsule 0  . isosorbide mononitrate (IMDUR) 30 MG 24 hr tablet Take 30 mg by mouth daily.    [name] levothyroxine (SYNTHROID, LEVOTHROID) 50 MCG tablet Take 50 mcg by mouth daily.    . memantine (NAMENDA) 10 MG tablet Take 1 tablet by mouth 2 (two) times daily.    . nitroGLYCERIN (NITROSTAT) 0.4 MG SL tablet PLACE ONE TABLET UNDER TONGUE EVERY 5 MIN UP TO 3 DOSES AS NEEDED FORCHEST PAIN. 25 tablet 3  . oxycodone (ROXICODONE) 30 MG immediate release tablet Take 15 mg by mouth 3 (three) times daily.    . pravastatin (PRAVACHOL)  40 MG tablet TAKE (1) TABLET BY MOUTH AT BEDTIME. 15 tablet 0  . tamsulosin (FLOMAX) 0.4 MG CAPS capsule Take 1 capsule by mouth daily.     No current facility-administered medications for this visit.     Discontinued Meds:   There are no discontinued medications.  Patient Active Problem List   Diagnosis Date Noted  . Pressure injury of skin 02/15/2017  . Chronic diastolic CHF (congestive heart failure) ([place]) 12/24/2016  . Sinus bradycardia 12/23/2016  . Foot pain, bilateral 12/23/2016  . Hypokalemia 04/05/2016  . Acute respiratory failure ([place]) 04/03/2016  . Acute encephalopathy 04/03/2016  . Hypothyroidism 04/03/2016  . Aspiration pneumonia ([place]) 04/03/2016  . Dementia 04/03/2016  . Acute respiratory failure with hypoxia ([place]) 04/03/2016  . Memory loss 04/20/2015  . Hypoxia 01/27/2015  . CAP (community acquired pneumonia) 01/27/2015  . Fever 08/30/2014  . Healthcare associated bacterial pneumonia 08/30/2014  . Toxic Metabolic encephalopathy [id number]  . Sepsis ([place]) 08/30/2014  . Neck pain on right side 08/29/2014  . Neck pain 08/29/2014  . Left-sided weakness 06/28/2014  . Chest pain 06/27/2014  . Tubular adenoma of colon 03/05/2013  . Anorexia 03/05/2013  . Loss  of weight 03/05/2013  . Chronic constipation 07/04/2012  . Bronchitis 08/16/2011  . Hyperlipidemia 10/01/2009  . OSA (obstructive sleep apnea) 10/01/2009  . [place] DISEASE 10/01/2009  . HYPERTENSION, BENIGN 10/01/2009  . Coronary artery disease 10/01/2009    LABS    Component Value Date/Time   NA 140 02/14/2017 0510   NA 138 02/13/2017 0525   NA 137 02/12/2017 1319   NA 139 01/26/2013   K 3.8 02/14/2017 0510   K 3.8 02/13/2017 0525   K 3.5 02/12/2017 1319   K 3.9 01/26/2013   CL 99 (L) 02/14/2017 0510   CL 100 (L) 02/13/2017 0525   CL 98 (L) 02/12/2017 1319   CL 100 01/26/2013   CO2 34 (H) 02/14/2017 0510   CO2 31 02/13/2017 0525   CO2 33 (H) 02/12/2017 1319   CO2 31 01/26/2013    GLUCOSE 103 (H) 02/14/2017 0510   GLUCOSE 105 (H) 02/13/2017 0525   GLUCOSE 187 (H) 02/12/2017 1319   BUN 15 02/14/2017 0510   BUN 12 02/13/2017 0525   BUN 12 02/12/2017 1319   BUN 14 01/26/2013   CREATININE 0.79 02/14/2017 0510   CREATININE 0.85 02/13/2017 0525   CREATININE 0.93 02/12/2017 1319   CREATININE 0.80 01/26/2013   CREATININE 0.81 11/08/2012 1519   CREATININE 0.77 07/04/2012 1403   CALCIUM 8.6 (L) 02/14/2017 0510   CALCIUM 8.6 (L) 02/13/2017 0525   CALCIUM 8.3 (L) 02/12/2017 1319   GFRNONAA >60 02/14/2017 0510   GFRNONAA >60 02/13/2017 0525   GFRNONAA >60 02/12/2017 1319   GFRAA >60 02/14/2017 0510   GFRAA >60 02/13/2017 0525   GFRAA >60 02/12/2017 1319   CMP     Component Value Date/Time   NA 140 02/14/2017 0510   NA 139 01/26/2013   K 3.8 02/14/2017 0510   K 3.9 01/26/2013   CL 99 (L) 02/14/2017 0510   CL 100 01/26/2013   CO2 34 (H) 02/14/2017 0510   CO2 31 01/26/2013   GLUCOSE 103 (H) 02/14/2017 0510   BUN 15 02/14/2017 0510   BUN 14 01/26/2013   CREATININE 0.79 02/14/2017 0510   CREATININE 0.80 01/26/2013   CALCIUM 8.6 (L) 02/14/2017 0510   PROT 8.4 (H) 01/26/2015 2239   PROT 6.7 01/26/2013   ALBUMIN 4.8 01/26/2015 2239   ALBUMIN 4.6 01/26/2013   AST 22 01/26/2015 2239   AST 16 01/26/2013   ALT 19 01/26/2015 2239   ALKPHOS 61 01/26/2015 2239   ALKPHOS 70 01/26/2013   BILITOT 1.1 01/26/2015 2239   BILITOT 0.8 01/26/2013   GFRNONAA >60 02/14/2017 0510   GFRAA >60 02/14/2017 0510       Component Value Date/Time   WBC 6.3 02/15/2017 0322   WBC 7.1 02/14/2017 0510   WBC 10.6 (H) 02/13/2017 0525   HGB 10.7 (L) 02/15/2017 0322   HGB 11.2 (L) 02/14/2017 0510   HGB 11.9 (L) 02/13/2017 0525   HCT 34.2 (L) 02/15/2017 0322   HCT 34.3 (L) 02/14/2017 0510   HCT 36.0 (L) 02/13/2017 0525   HCT 43 01/26/2013   MCV 97.4 02/15/2017 0322   MCV 97.2 02/14/2017 0510   MCV 96.5 02/13/2017 0525    Lipid Panel     Component Value Date/Time   CHOL 127  06/29/2014 0308   TRIG 143 06/29/2014 0308   HDL 40 06/29/2014 0308   CHOLHDL 3.2 06/29/2014 0308   VLDL 29 06/29/2014 0308   LDLCALC 58 06/29/2014 0308    ABG    Component Value  Date/Time   PHART 7.404 04/04/2016 0945   PCO2ART 46.6 (H) 04/04/2016 0945   PO2ART 80.3 04/04/2016 0945   HCO3 27.6 (H) 04/04/2016 0945   TCO2 20.4 04/04/2016 0350   O2SAT 95.7 04/04/2016 0945     Lab Results  Component Value Date   TSH 3.893 02/12/2017   BNP (last 3 results)  Recent Labs  04/03/16 1006  BNP 173.0*    ProBNP (last 3 results) No results for input(s): PROBNP in the last 8760 hours.  Cardiac Panel (last 3 results) No results for input(s): CKTOTAL, CKMB, TROPONINI, RELINDX in the last 72 hours.  Iron/TIBC/Ferritin/ %Sat    Component Value Date/Time   IRON 33 (L) 02/15/2017 0322   TIBC 245 (L) 02/15/2017 0322   FERRITIN 112 02/15/2017 0322   IRONPCTSAT 13 (L) 02/15/2017 0322     EKG Orders placed or performed during the hospital encounter of 02/12/17  . ED EKG within 10 minutes  . ED EKG within 10 minutes  . EKG 12-Lead  . EKG 12-Lead  . EKG 12-Lead  . EKG 12-Lead  . EKG 12-Lead (at 6am)  . EKG 12-Lead (at 6am)  . EKG  . EKG 12-Lead  . EKG 12-Lead     Prior Assessment and Plan Problem List as of 03/01/2017 Reviewed: 12/24/2016  6:18 AM by Vianne Bulls, MD     Cardiovascular and Mediastinum   HYPERTENSION, BENIGN   Last Assessment & Plan 07/11/2014 Office Visit Written 07/11/2014  4:02 PM by Lendon Colonel, NP    Blood pressure is currently well-controlled. Will continue current medication regimen as directed. As stated I an concerned about his compliance, it appears he is compliant but has told me that he frequently forgets to take his medicines. He will followup with Dr. Domenic Polite in 3 months and bring his brother with him to this appointment. In the interim, I will have [facility] see him. I explained this to him and told him to expect a phone call to make that  appointment      Coronary artery disease   Last Assessment & Plan 07/11/2014 Office Visit Written 07/11/2014  4:01 PM by Lendon Colonel, NP    Mr. Shortino is a difficult historian, but denies recurrent chest pain. I am very concerned about this man's overall health status, as I am uncertain that he will be able to maintain health maintenance with his worsening Alzheimer's and forgetfulness. The patient admits to sometimes forgetting to take his medicines, but he has a brother who he states comes by daily to check on them. I have asked him to have his brother make out his medications for each day says he will not forget to take them and ask them check on him about this. I have also asked him to bring his brother with him to the next appointment.  I am going to have [facility] est. contact with him to evaluate his needs, talk with his brother, and evaluate the necessity of home health vs. skilled nursing facility placement. This was not addressed on evaluation of his recent hospitalization records.      Sinus bradycardia   Chronic diastolic CHF (congestive heart failure) (HCC)     Respiratory   OSA (obstructive sleep apnea)   Last Assessment & Plan 12/17/2011 Office Visit Written 12/17/2011  3:15 PM by Hillary Bow, MD    Does not use CPAP.  Has one---but broken.      Bronchitis   Last Assessment & Plan 08/09/2011  Office Visit Written 08/16/2011 10:45 PM by Hillary Bow, MD    Had several day course of Azithromycin and is improved.  TS      Healthcare associated bacterial pneumonia   Hypoxia   CAP (community acquired pneumonia)   Acute respiratory failure ([place])   Aspiration pneumonia ([place])   Acute respiratory failure with hypoxia [facility])     Digestive   Chronic constipation   Last Assessment & Plan 11/08/2012 Office Visit Written 11/08/2012  3:39 PM by Mahala Menghini, PA    Start Amitiza 66mcg BID with food for opioid-induced constipation. Samples and RX provided.       Tubular  adenoma of colon     Endocrine   Hypothyroidism     Nervous and Auditory   [facility] DISEASE   Last Assessment & Plan 07/11/2014 Office Visit Written 07/11/2014  4:03 PM by Lendon Colonel, NP    Close followup with his primary care physician is recommended. The patient states he is supposed to see him on July 30. I hope this is true, with ongoing management of this and need for more skilled care in the setting of an invalid wife at home.      Left-sided weakness   Toxic Metabolic encephalopathy   Acute encephalopathy   Dementia     Musculoskeletal and Integument   Pressure injury of skin     Other   Hyperlipidemia   Last Assessment & Plan 05/21/2014 Office Visit Written 05/21/2014  2:14 PM by Satira Sark, MD    He continues on Pravachol, followed by Dr. Gerarda Fraction.      Anorexia   Loss of weight   Last Assessment & Plan 03/15/2013 Office Visit Written 03/18/2013  9:38 PM by Hillary Bow, MD    Encouraged primary fu evaluation.       Chest pain   Neck pain on right side   Neck pain   Fever   Sepsis (HCC)   Memory loss   Hypokalemia   Foot pain, bilateral       Imaging: Ct Angio Chest Pe W Or Wo Contrast  Result Date: 02/12/2017 CLINICAL DATA:  Acute onset of upper mid sternal chest pain, radiating to the throat and shoulders. Hoarseness. Initial encounter. EXAM: CT ANGIOGRAPHY CHEST WITH CONTRAST TECHNIQUE: Multidetector CT imaging of the chest was performed using the standard protocol during bolus administration of intravenous contrast. Multiplanar CT image reconstructions and MIPs were obtained to evaluate the vascular anatomy. CONTRAST:  100 mL of Isovue 370 IV contrast COMPARISON:  CTA of the chest performed 04/03/2016, and chest radiograph performed earlier today at 1:27 p.m. FINDINGS: Cardiovascular:  There is no evidence of pulmonary embolus. Diffuse coronary artery calcifications are seen. The patient is status post median sternotomy, with evidence of prior  CABG. Scattered calcification noted along the aortic arch, descending thoracic aorta and proximal great vessels. Mediastinum/Nodes: The mediastinum is otherwise unremarkable in appearance. No mediastinal lymphadenopathy is seen. No pericardial effusion is identified. The thyroid gland is unremarkable. No axillary lymphadenopathy is appreciated. Lungs/Pleura: A trace right-sided pleural effusion is noted, with mild associated atelectasis. Minimal emphysema is noted at the lung apices. No pneumothorax is seen. No masses are identified. Upper Abdomen: The visualized portions of the liver and spleen are grossly unremarkable. There is reflux of contrast into the hepatic veins and IVC. The visualized portions of the gallbladder, pancreas and right adrenal gland are within normal limits. Musculoskeletal: No acute osseous abnormalities are identified. The visualized musculature  is unremarkable in appearance. Review of the MIP images confirms the above findings. IMPRESSION: 1. No evidence of pulmonary embolus. 2. Trace right-sided pleural effusion, with mild associated atelectasis. 3. Minimal emphysema at the lung apices. 4. Diffuse coronary artery calcifications seen. 5. Reflux of contrast into the hepatic veins and IVC. Electronically Signed   By: Garald Balding M.D.   On: 02/12/2017 17:31   Dg Chest Port 1 View  Result Date: 02/12/2017 CLINICAL DATA:  Acute chest pain, coronary disease post MI, essential hypertension EXAM: PORTABLE CHEST 1 VIEW COMPARISON:  Portable exam 1327 hours compared to 12/23/2016 FINDINGS: Enlargement of cardiac silhouette post CABG. Mediastinal contours and pulmonary vascularity normal. Minimal atherosclerotic calcification aorta. Mild atelectasis versus infiltrate at RIGHT base. Lungs otherwise clear. No pleural effusion or pneumothorax. Bones demineralized. IMPRESSION: Minimal enlargement of cardiac silhouette post CABG. Mild atelectasis versus infiltrate at RIGHT base. Electronically  Signed   By: Lavonia Dana M.D.   On: 02/12/2017 13:44

## 2017-03-01 NOTE — Patient Instructions (Addendum)
Your physician recommends that you schedule a follow-up appointment in: 2 Weeks   Your physician recommends that you continue on your current medications as directed. Please refer to the Current Medication list given to you today.  Your physician has requested that you have an echocardiogram. Echocardiography is a painless test that uses sound waves to create images of your heart. It provides your doctor with information about the size and shape of your heart and how well your heart's chambers and valves are working. This procedure takes approximately one hour. There are no restrictions for this procedure.  Your physician has requested that you have a carotid duplex. This test is an ultrasound of the carotid arteries in your neck. It looks at blood flow through these arteries that supply the brain with blood. Allow one hour for this exam. There are no restrictions or special instructions.  Your physician has recommended that you wear an event monitor. Event monitors are medical devices that record the heart's electrical activity. Doctors most often [place] these monitors to diagnose arrhythmias. Arrhythmias are problems with the speed or rhythm of the heartbeat. The monitor is a small, portable device. You can wear one while you do your normal daily activities. This is usually used to diagnose what is causing palpitations/syncope (passing out).  If you need a refill on your cardiac medications before your next appointment, please call your pharmacy.  Thank you for choosing [facility]!

## 2017-03-07 ENCOUNTER — Other Ambulatory Visit (HOSPITAL_COMMUNITY): Payer: PPO

## 2017-03-07 DIAGNOSIS — J9620 Acute and chronic respiratory failure, unspecified whether with hypoxia or hypercapnia: Secondary | ICD-10-CM | POA: Diagnosis not present

## 2017-03-08 ENCOUNTER — Ambulatory Visit (HOSPITAL_COMMUNITY)
Admission: RE | Admit: 2017-03-08 | Discharge: 2017-03-08 | Disposition: A | Discharge status: Home / Self Care | Payer: PPO | Source: Ambulatory Visit | Attending: Adult Health | Admitting: Adult Health

## 2017-03-08 DIAGNOSIS — I6523 Occlusion and stenosis of bilateral carotid arteries: Secondary | ICD-10-CM | POA: Insufficient documentation

## 2017-03-08 DIAGNOSIS — R0989 Other specified symptoms and signs involving the circulatory and respiratory systems: Secondary | ICD-10-CM | POA: Diagnosis not present

## 2017-03-08 DIAGNOSIS — I5032 Chronic diastolic (congestive) heart failure: Secondary | ICD-10-CM | POA: Insufficient documentation

## 2017-03-08 DIAGNOSIS — I340 Nonrheumatic mitral (valve) insufficiency: Secondary | ICD-10-CM | POA: Diagnosis not present

## 2017-03-08 NOTE — Progress Notes (Signed)
*  PRELIMINARY RESULTS* Echocardiogram 2D Echocardiogram has been performed.  Samuel Germany 03/08/2017, 12:43 PM

## 2017-03-14 ENCOUNTER — Encounter: Payer: Self-pay | Admitting: Physician Assistant

## 2017-03-14 NOTE — Progress Notes (Addendum)
Cardiology Office Note    Date:  03/15/2017  ID:  PROPHET RENWICK, DOB 12-02-1934, MRN [id number] PCP:  Glo Herring., MD  Cardiologist:  Dr. Domenic Polite   Chief Complaint: f/u bradycardia and studies  History of Present Illness:  Dustin [name] is a 83 y.o. male with history of CAD (s/p CABG 1996, DESx2 to SVG-PDA 07/2011 with distal disease managed medically), HTN, OSA, dementia, hypothyroidism, atrophic gastritis by EGD 2014, LBBB, carotid artery disease, bradycardia who presents for f/u of bradycardia  The patient was recently hospitalized in December 2017 with recurrent chest pain, felt to be atypical, an outpatient stress Myoview was recommended. Stress test on 12/28/2016 revealed inferior defect partial improvement consistent with scar/soft tissue attenuation and peri-infarct ischemia, LVEF of 46% and found to be an intermediate risk study. He did not follow-up in our office post procedure. No new areas of ischemia were noted and medical therapy was continued. More recently he was admitted 01/2017 with bradycardia with heart rate in the 20s, transiently with heart rate returning to her 30s to 50 range. His slower rates were observed at times he was asleep (and noted he snores) and easily woken, at times awake quietly watching TV without symptoms. Heart rate improved off carvedilol and Aricept. Nurses ambulated him and heart rate rose appropriately. He was not felt to require PPM at that time. F/u echo 03/08/17: EF 55%, grade 1 Dd, mild HK of mid inferior moycardium, mild MR, mod LAE, mildly reduced RV function, mild RAE, mild TR. Carotid US 03/08/17: mild-moderate carotid atherosclerosis, <50% bilaterally. 7 day event recorder report: "Sinus rhythm noted throughout. Lowest recorded heart rate 44 bpm (3:16 AM March 8) with upper heart rate 94 bpm. Average heart rate was in the upper 50s. There were no pauses."  He returns for follow-up with his wife. Regarding chest pain he says this has resolved  and he feels the best he's felt in a while. No dyspnea, palpitations, lightheadedness or syncope. He reports he self-restarted Aricept because he noticed a huge drop off in his memory without it. He understands the risk of slow HR but insists he absolutely needs this. As noted in prior OV notes he continues to perseverate on his PCP refusing to allow him to drive due to his dementia.   Past Medical History:  Diagnosis Date  . Alzheimer disease   . Anxiety   . Arthritis   . Atrophic gastritis    a. By EGD 02/2013.  . Carotid artery disease ([place])    a. mild-mod plaque, <50% stenosis bilat by duplex 2018.  [name] Coronary atherosclerosis of native coronary artery    a. Multivessel s/p CABG 1996. b. s/p DES x 2 SVG to PDA 8/12 with distal disease managed medically.  . DDD (degenerative disc disease)    Chronic back pain  . Enlarged prostate   . Essential hypertension, benign   . Hematuria   . Hypothyroidism   . LBBB (left bundle branch block)   . MI (myocardial infarction)   . Mixed hyperlipidemia   . OA (osteoarthritis)   . OSA (obstructive sleep apnea)   . Sinus bradycardia    a. Aricept and BB discontinued due to this.    Past Surgical History:  Procedure Laterality Date  . COLONOSCOPY  08/03/2004   Jenkins-numerous large diverticula in the descending, transverse, descending, and sigmoid colon. Otherwise normal exam.  . COLONOSCOPY  07/12/2012   RMR: External hemorrhoidal tag; multiple rectal and colonic polyps removed and/or treated as  described above. Pancolonic diverticulosis. Bx-tubular adenomas, rectal hyperplastic polyp. next colonoscopy in 06/2015.  [name] COLONOSCOPY N/A 06/22/2016   Procedure: COLONOSCOPY;  Surgeon: Aviva Signs, MD;  [facility];  Service: Gastroenterology;  Laterality: N/A;  730  . CORONARY ANGIOPLASTY WITH STENT PLACEMENT    . CORONARY ARTERY BYPASS GRAFT  1996   LIMA to LAD, SVG to D2, SVG to PDA, SVG to OM1 and OM2  . ESOPHAGOGASTRODUODENOSCOPY N/A  03/16/2013   Procedure: ESOPHAGOGASTRODUODENOSCOPY (EGD);  Surgeon: Daneil Dolin, MD;  Location: [facility];  Service: Endoscopy;  Laterality: N/A;  12:00-moved to [place] notified pt  . ESOPHAGOGASTRODUODENOSCOPY N/A 03/06/2013   Procedure: ESOPHAGOGASTRODUODENOSCOPY (EGD);  Surgeon: Daneil Dolin, MD;  Location: [facility];  Service: Endoscopy;  Laterality: N/A;  . HERNIA REPAIR      Current Medications: Current Outpatient Prescriptions  Medication Sig Dispense Refill  . ALPRAZolam (XANAX) 1 MG tablet Take 1 tablet (1 mg total) by mouth 2 (two) times daily. For anxiety/sleep (Patient taking differently: Take 1.5 mg by mouth at bedtime as needed. For anxiety/sleep) 30 tablet 0  . clopidogrel (PLAVIX) 75 MG tablet Take 1 tablet (75 mg total) by mouth daily.    [name] escitalopram (LEXAPRO) 10 MG tablet Take 10 mg by mouth daily.    . fentaNYL (DURAGESIC - DOSED MCG/HR) 50 MCG/HR 1 patch every other day.    . furosemide (LASIX) 20 MG tablet Take 20 mg by mouth daily.     [name] gabapentin (NEURONTIN) 300 MG capsule Take 1 capsule (300 mg total) by mouth 3 (three) times daily. 90 capsule 0  . isosorbide mononitrate (IMDUR) 30 MG 24 hr tablet Take 30 mg by mouth daily.    [name] levothyroxine (SYNTHROID, LEVOTHROID) 50 MCG tablet Take 50 mcg by mouth daily.    . memantine (NAMENDA) 10 MG tablet Take 1 tablet by mouth 2 (two) times daily.    . nitroGLYCERIN (NITROSTAT) 0.4 MG SL tablet PLACE ONE TABLET UNDER TONGUE EVERY 5 MIN UP TO 3 DOSES AS NEEDED FORCHEST PAIN. 25 tablet 3  . oxycodone (ROXICODONE) 30 MG immediate release tablet Take 15 mg by mouth 3 (three) times daily.    . pravastatin (PRAVACHOL) 40 MG tablet TAKE (1) TABLET BY MOUTH AT BEDTIME. 15 tablet 0  . tamsulosin (FLOMAX) 0.4 MG CAPS capsule Take 1 capsule by mouth daily.     No current facility-administered medications for this visit.      Allergies:   Patient has no known allergies.   Social History   Social History  .  Marital status: Married    Spouse name: N/A  . Number of children: 3  . Years of education: N/A   Occupational History  . retired Retired   Social History Main Topics  . Smoking status: Former Smoker    Packs/day: 2.00    Years: 40.00    Types: Cigarettes    Quit date: 12/27/1994  . Smokeless tobacco: Never Used  . Alcohol use No  . Drug use: No  . Sexual activity: Not Currently   Other Topics Concern  . None   Social History Narrative   Lives w/ ailing wife.   Son brings meals 3x per week     Family History:  Family History  Problem Relation Age of Onset  . Heart disease    . Colon cancer Neg Hx     ROS:   Please see the history of present illness. + longstanding neuropathy  of feet All other systems are reviewed and otherwise negative.    PHYSICAL EXAM:   VS:  BP 122/68   Pulse 70   Ht 5\' 9"  (1.753 m)   Wt 199 lb (90.3 kg)   SpO2 96%   BMI 29.39 kg/m   BMI: Body mass index is 29.39 kg/m. GEN: Well nourished, well developed WM, in no acute distress  HEENT: normocephalic, atraumatic Neck: no JVD, carotid bruits, or masses Cardiac: RRR; no murmurs, rubs, or gallops, no edema  Respiratory:  clear to auscultation bilaterally, normal work of breathing GI: soft, nontender, nondistended, + BS MS: no deformity or atrophy  Skin: warm and dry, no rash Neuro:  Alert and Oriented x 3, Strength and sensation are intact, follows commands, tendency to perseverate and become tangential Psych: euthymic mood, full affect  Wt Readings from Last 3 Encounters:  03/15/17 199 lb (90.3 kg)  03/01/17 197 lb (89.4 kg)  02/14/17 200 lb 3.2 oz (90.8 kg)      Studies/Labs Reviewed:   EKG:   EKG was not ordered today.  Recent Labs: 04/03/2016: B Natriuretic Peptide 173.0 02/12/2017: Magnesium 2.0; TSH 3.893 02/14/2017: BUN 15; Creatinine, Ser 0.79; Potassium 3.8; Sodium 140 02/15/2017: Hemoglobin 10.7; Platelets 201   Lipid Panel    Component Value Date/Time   CHOL 127  06/29/2014 0308   TRIG 143 06/29/2014 0308   HDL 40 06/29/2014 0308   CHOLHDL 3.2 06/29/2014 0308   VLDL 29 06/29/2014 0308   LDLCALC 58 06/29/2014 0308    Additional studies/ records that were reviewed today include: Summarized above.    ASSESSMENT & PLAN:   1. CAD - chest pain resolved, continue medical therapy. Observe for any recurrent symptoms.  2. Sinus bradycardia - resolved off beta blocker. The patient restarted Aricept, claiming he absolutely needs it for his dementia. He has been following his heart rate at home and has not seen any further slow readings. We had a long talk about this, including possible need for pacemaker if his HR should drop again. He refuses to do without the Aricept. He accepts the risk of slow HR and complications while on this medication. I have asked him to follow his HR daily (which he already does) - I told him if he gets any HR less than 50 then he should hold Aricept and call our office. I reaffirmed that we would defer longterm decision regarding driving to his PCP, but I also feel in the meantime while back on Aricept we cannot clear him for driving from cardiac standpoint until we see if this affects his HR in the absence of beta blocker this time. 3. Essential HTN - controlled. 4. Carotid artery disease - no intervention needed at this time. Follow.   Disposition: F/u with Dr. Domenic Polite in 2-3 months.   Medication Adjustments/Labs and Tests Ordered: Current medicines are reviewed at length with the patient today.  Concerns regarding medicines are outlined above. Medication changes, Labs and Tests ordered today are summarized above and listed in the Patient Instructions accessible in Encounters.   Signed, Melina Copa PA-C  03/15/2017 1:41 PM    [facility] Location in [place]. [place], [place] [address] Ph: [phone]; Fax [phone]

## 2017-03-15 ENCOUNTER — Encounter: Payer: Self-pay | Admitting: Physician Assistant

## 2017-03-15 ENCOUNTER — Ambulatory Visit (INDEPENDENT_AMBULATORY_CARE_PROVIDER_SITE_OTHER): Payer: PPO | Admitting: Physician Assistant

## 2017-03-15 VITALS — BP 122/68 | HR 70 | Ht 69.0 in | Wt 199.0 lb

## 2017-03-15 DIAGNOSIS — R001 Bradycardia, unspecified: Secondary | ICD-10-CM

## 2017-03-15 DIAGNOSIS — I779 Disorder of arteries and arterioles, unspecified: Secondary | ICD-10-CM

## 2017-03-15 DIAGNOSIS — I10 Essential (primary) hypertension: Secondary | ICD-10-CM | POA: Diagnosis not present

## 2017-03-15 DIAGNOSIS — I739 Peripheral vascular disease, unspecified: Secondary | ICD-10-CM

## 2017-03-15 DIAGNOSIS — I25810 Atherosclerosis of coronary artery bypass graft(s) without angina pectoris: Secondary | ICD-10-CM

## 2017-03-15 NOTE — Patient Instructions (Signed)
Your physician recommends that you schedule a follow-up appointment in: 2-3 Months with Dr. Domenic Polite  Your physician recommends that you continue on your current medications as directed. Please refer to the Current Medication list given to you today.  Check heart rate daily. If less than 50 BPM call our office at [phone], and do not take Aricept.   If you need a refill on your cardiac medications before your next appointment, please call your pharmacy.  Thank you for choosing [facility]!

## 2017-03-30 DIAGNOSIS — R51 Headache: Secondary | ICD-10-CM | POA: Diagnosis not present

## 2017-03-30 DIAGNOSIS — E063 Autoimmune thyroiditis: Secondary | ICD-10-CM | POA: Diagnosis not present

## 2017-03-30 DIAGNOSIS — Z6827 Body mass index (BMI) 27.0-27.9, adult: Secondary | ICD-10-CM | POA: Diagnosis not present

## 2017-03-30 DIAGNOSIS — G309 Alzheimer's disease, unspecified: Secondary | ICD-10-CM | POA: Diagnosis not present

## 2017-04-04 ENCOUNTER — Encounter: Payer: Self-pay | Admitting: Neurology

## 2017-04-05 ENCOUNTER — Telehealth: Payer: Self-pay | Admitting: Neurology

## 2017-04-05 NOTE — Telephone Encounter (Signed)
Will forward message to provider. 

## 2017-04-05 NOTE — Telephone Encounter (Signed)
Caller: [name]  Urgent? No  Reason for the call: He is needing to get a letter from Dr. Delice Lesch stating that he is able to drive. He said he has 30 days to get the letter in or they will take his License. Please call. Thank you

## 2017-04-05 NOTE — Telephone Encounter (Signed)
I have not seen him since 2016. Please let him know that at this time, we would recommend he have a driving evaluation before I can fill out any driving forms. The evaluation is done either with the [facility] or with an occupational therapist (can contact [employer] in [place] or [employer] [phone]). Thanks

## 2017-04-05 NOTE — Telephone Encounter (Signed)
Clld pt - advsd of provider's notations.   Pt stated he will wait until his appt in June to have her do her assessment to determine whether or not he should be driving.

## 2017-04-07 DIAGNOSIS — J9620 Acute and chronic respiratory failure, unspecified whether with hypoxia or hypercapnia: Secondary | ICD-10-CM | POA: Diagnosis not present

## 2017-04-12 DIAGNOSIS — H2513 Age-related nuclear cataract, bilateral: Secondary | ICD-10-CM | POA: Diagnosis not present

## 2017-05-02 ENCOUNTER — Other Ambulatory Visit: Payer: PPO

## 2017-05-02 ENCOUNTER — Ambulatory Visit (INDEPENDENT_AMBULATORY_CARE_PROVIDER_SITE_OTHER): Payer: Self-pay | Admitting: Neurology

## 2017-05-02 ENCOUNTER — Encounter: Payer: Self-pay | Admitting: Neurology

## 2017-05-02 VITALS — BP 138/64 | HR 49 | Temp 97.8°F | Ht 68.0 in | Wt 201.0 lb

## 2017-05-02 DIAGNOSIS — R51 Headache: Secondary | ICD-10-CM

## 2017-05-02 DIAGNOSIS — R413 Other amnesia: Secondary | ICD-10-CM

## 2017-05-02 DIAGNOSIS — Z029 Encounter for administrative examinations, unspecified: Secondary | ICD-10-CM

## 2017-05-02 DIAGNOSIS — R519 Headache, unspecified: Secondary | ICD-10-CM

## 2017-05-02 NOTE — Progress Notes (Signed)
NEUROLOGY FOLLOW UP OFFICE NOTE  Dustin [name] [id number] 1934-05-06  HISTORY OF PRESENT ILLNESS: I had the pleasure of seeing Dustin [name] in follow-up in the neurology clinic on 05/02/2017. The patient was last seen more than 2 years ago for evaluation of worsening memory and driving ability. He presents today also reporting new onset headaches for the past 6-8 months. Headaches occur 4 or 5 days of the week, mostly in the evening with pain in the bitemporal regions radiating to the frontal and vertex regions. He describes the pain as dull, no associated nausea/vomiting, photo/phonophobia. No dizziness, focal numbness/tingling/weakness, no neck/back pain. He takes 1/2 Fioricet then goes to bed. He used to have headaches when he was younger. He is reporting nerve damage in both legs. We again discussed memory concerns, he states his wife reported him to the [facility] because around 8-10 years ago he saw a doctor in [place] who said he was "100% sure he had dementia." He was started on Aricept but had bradycardia and stopped it 2 months ago. He has not noticed any difference stopping the medication. He continues to take Namenda 67m BID. He has stopped using the Fentanyl patches. [facility] (?[employer] account [employer] and being told he did well, but wanting him to come every year to reassure he is ready to drive. He denies getting lost driving, no missed medications, he fixes his pills himself. He denies any missed bill payments.  HPI 04/14/2015: This is an 83yo RH man with a history of hypertension, hyperlipidemia, chronic pain, thyroiditis, with worsening memory loss. It was initially unclear why he presented because he and his son report that his memory is fine, however toward the end of the visit, he stated he needed a note for the DMV to return to driving. He had been evaluated by neurologist Dr. YKrista [name] 2013 for back pain and right leg radiculopathy, but also  "checked my memory." Records unavailable for review, he was started on Aricept at that time. Namenda was added by his PCP 2 years ago. He feels his memory is fine. He denies any missed bill payments, and states he is the one who pays for his son's bills. He forgets where he puts things, but eventually remembers. He does note that he does not take his medications regularly, "when I think about it." He has a pill box but sometimes forgets still. His son has noticed that he repeats himself but it's "not bad." There is one note received from his PCP where his wife had reported sundowning, hallucinations. When asked about the DMV evaluation, he reports that he was pulled over 3 months ago because he did not stop where he was supposed to. [facility]. He then repeated the test 2 weeks later and tells me he was told he is "the best driver." However, it appears his driver's license was not reinstated and he is [facility]   He has headaches once a week or so, lasting 2-3 days, with soreness and achy feeling in the frontal and temporal regions, no associated nausea, vomiting, photo/phonophobia. He has some dizziness described as unsteadiness when walking, no falls. He has burning in the bottom of both feet, feeling like he is walking barefoot on hot rocks of coal. He denies any diplopia, dysarthria, dysphagia, neck pain,  bowel/bladder dysfunction. No family history of dementia.   I personally reviewed head CT done 08/2014 which showed mild diffuse cerebral and cerebellar atrophy, no acute changes.   PAST MEDICAL HISTORY: Past Medical History:  Diagnosis Date  . Alzheimer disease   . Anxiety   . Arthritis   . Atrophic gastritis    a. By EGD 02/2013.  . Carotid artery disease ([place])    a. mild-mod plaque, <50% stenosis bilat by duplex 2018.  [name] Coronary atherosclerosis of native  coronary artery    a. Multivessel s/p CABG 1996. b. s/p DES x 2 SVG to PDA 8/12 with distal disease managed medically.  . DDD (degenerative disc disease)    Chronic back pain  . Enlarged prostate   . Essential hypertension, benign   . Hematuria   . Hypothyroidism   . LBBB (left bundle branch block)   . MI (myocardial infarction)   . Mixed hyperlipidemia   . OA (osteoarthritis)   . OSA (obstructive sleep apnea)   . Sinus bradycardia    a. Aricept and BB discontinued due to this.    MEDICATIONS: Current Outpatient Prescriptions on File Prior to Visit  Medication Sig Dispense Refill  . ALPRAZolam (XANAX) 1 MG tablet Take 1 tablet (1 mg total) by mouth 2 (two) times daily. For anxiety/sleep (Patient taking differently: Take 1.5 mg by mouth at bedtime as needed. For anxiety/sleep) 30 tablet 0  . clopidogrel (PLAVIX) 75 MG tablet Take 1 tablet (75 mg total) by mouth daily.    [name] escitalopram (LEXAPRO) 10 MG tablet Take 10 mg by mouth daily.    . fentaNYL (DURAGESIC - DOSED MCG/HR) 50 MCG/HR 1 patch every other day.    . furosemide (LASIX) 20 MG tablet Take 20 mg by mouth daily.     [name] gabapentin (NEURONTIN) 300 MG capsule Take 1 capsule (300 mg total) by mouth 3 (three) times daily. 90 capsule 0  . isosorbide mononitrate (IMDUR) 30 MG 24 hr tablet Take 30 mg by mouth daily.    [name] levothyroxine (SYNTHROID, LEVOTHROID) 50 MCG tablet Take 50 mcg by mouth daily.    . memantine (NAMENDA) 10 MG tablet Take 1 tablet by mouth 2 (two) times daily.    . nitroGLYCERIN (NITROSTAT) 0.4 MG SL tablet PLACE ONE TABLET UNDER TONGUE EVERY 5 MIN UP TO 3 DOSES AS NEEDED FORCHEST PAIN. 25 tablet 3  . oxycodone (ROXICODONE) 30 MG immediate release tablet Take 15 mg by mouth 3 (three) times daily.    . pravastatin (PRAVACHOL) 40 MG tablet TAKE (1) TABLET BY MOUTH AT BEDTIME. 15 tablet 0  . tamsulosin (FLOMAX) 0.4 MG CAPS capsule Take 1 capsule by mouth daily.     No current facility-administered medications on  file prior to visit.     ALLERGIES: No Known Allergies  FAMILY HISTORY: Family History  Problem Relation Age of Onset  . Heart disease    . Colon cancer Neg Hx     SOCIAL HISTORY: Social History   Social History  . Marital status: Married    Spouse name: N/A  . Number of children: 3  . Years of education: N/A   Occupational History  . retired Retired   Social History Main Topics  . Smoking status: Former Smoker    Packs/day: 2.00    Years: 40.00    Types: Cigarettes    Quit date: 12/27/1994  . Smokeless tobacco: Never Used  . Alcohol use No  . Drug use: No  . Sexual  activity: Not Currently   Other Topics Concern  . Not on file   Social History Narrative   Lives w/ ailing wife.   Son brings meals 3x per week    REVIEW OF SYSTEMS: Constitutional: No fevers, chills, or sweats, no generalized fatigue, change in appetite Eyes: No visual changes, double vision, eye pain Ear, nose and throat: No hearing loss, ear pain, nasal congestion, sore throat Cardiovascular: No chest pain, palpitations Respiratory:  No shortness of breath at rest or with exertion, wheezes GastrointestinaI: No nausea, vomiting, diarrhea, abdominal pain, fecal incontinence Genitourinary:  No dysuria, urinary retention or frequency Musculoskeletal:  No neck pain, back pain Integumentary: No rash, pruritus, skin lesions Neurological: as above Psychiatric: No depression, insomnia, anxiety Endocrine: No palpitations, fatigue, diaphoresis, mood swings, change in appetite, change in weight, increased thirst Hematologic/Lymphatic:  No anemia, purpura, petechiae. Allergic/Immunologic: no itchy/runny eyes, nasal congestion, recent allergic reactions, rashes  PHYSICAL EXAM: Vitals:   05/02/17 1446  BP: 138/64  Pulse: (!) 49  Temp: 97.8 F (36.6 C)   General: No acute distress Head:  Normocephalic/atraumatic, no temporal or occipital tenderness or ropiness Neck: supple, no paraspinal tenderness,  full range of motion Heart:  Regular rate and rhythm Lungs:  Clear to auscultation bilaterally Back: No paraspinal tenderness Skin/Extremities: No rash, no edema Neurological Exam: alert and oriented to person, place, and time. No aphasia or dysarthria. Fund of knowledge is appropriate.  Recent and remote memory are intact.  Attention and concentration are normal, missed 2 letters spelling WORLD backward. Able to name objects and repeat phrases. CDT 5/5 MMSE - Mini Mental State Exam 05/02/2017 04/14/2015  Orientation to time 5 4  Orientation to Place 5 5  Registration 3 3  Attention/ Calculation 3 4  Recall 2 3  Language- name 2 objects 2 2  Language- repeat 1 1  Language- follow 3 step command 3 3  Language- read & follow direction 1 1  Write a sentence 1 1  Copy design 1 1  Total score 27 28   Cranial nerves: Pupils equal, round, reactive to light.  Fundoscopic exam unremarkable, no papilledema. Extraocular movements intact with no nystagmus. Visual fields full. Facial sensation intact. No facial asymmetry. Tongue, uvula, palate midline.  Motor: Bulk and tone normal, muscle strength 5/5 throughout with no pronator drift.  Sensation to light touch, temperature and vibration intact.  No extinction to double simultaneous stimulation.  Deep tendon reflexes 2+ throughout, toes downgoing.  Finger to nose testing intact.  Gait narrow-based and steady, able to tandem walk adequately.  Romberg negative.  IMPRESSION: This is an 83 yo RH man with a history of hypertension, hyperlipidemia, chronic pain, thyroiditis, seen 2 years ago for memory loss and evaluation of driving ability. He presents today with similar concerns, he did well with MMSE 27/30, however we discussed doing Neurocognitive testing to help determine if there are any deficits that would prohibit driving. He may benefit from doing a driving evaluation with the DMV or occupational therapy. He is taking Namenda 31m BID without side effects.  He is reporting new onset headaches, in this age group, temporal arteritis is considered, check ESR and CRP. MRI brain without contrast will be ordered to assess for underlying structural abnormality. He may take prn over the counter pain medication but knows to minimize intake to 2-3 a week to avoid rebound headaches. He will follow-up in 4 months and knows to call for any changes.   Thank you for allowing me to participate  in his care.  Please do not hesitate to call for any questions or concerns.  The duration of this appointment visit was 30 minutes of face-to-face time with the patient.  Greater than 50% of this time was spent in counseling, explanation of diagnosis, planning of further management, and coordination of care.   Ellouise Newer, M.D.   CC: Dr. Gerarda Fraction

## 2017-05-02 NOTE — Patient Instructions (Signed)
1. Bloodwork for ESR, CRP 2. Schedule MRI brain without contrast 3. Schedule Neurocognitive testing to evaluate for driving ability 4. Follow-up in 4 months, call for any changes 

## 2017-05-03 DIAGNOSIS — L03115 Cellulitis of right lower limb: Secondary | ICD-10-CM | POA: Diagnosis not present

## 2017-05-03 LAB — SEDIMENTATION RATE: Sed Rate: 18 mm/hr (ref 0–20)

## 2017-05-03 LAB — C-REACTIVE PROTEIN: CRP: 8.4 mg/L — ABNORMAL HIGH (ref <8.0)

## 2017-05-04 DIAGNOSIS — H2511 Age-related nuclear cataract, right eye: Secondary | ICD-10-CM | POA: Diagnosis not present

## 2017-05-04 DIAGNOSIS — H25013 Cortical age-related cataract, bilateral: Secondary | ICD-10-CM | POA: Diagnosis not present

## 2017-05-04 DIAGNOSIS — H2513 Age-related nuclear cataract, bilateral: Secondary | ICD-10-CM | POA: Diagnosis not present

## 2017-05-04 DIAGNOSIS — H35373 Puckering of macula, bilateral: Secondary | ICD-10-CM | POA: Diagnosis not present

## 2017-05-05 ENCOUNTER — Other Ambulatory Visit: Payer: Self-pay | Admitting: Adult Health

## 2017-05-06 ENCOUNTER — Telehealth: Payer: Self-pay

## 2017-05-06 ENCOUNTER — Other Ambulatory Visit: Payer: Self-pay

## 2017-05-06 ENCOUNTER — Encounter: Payer: Self-pay | Admitting: Neurology

## 2017-05-06 DIAGNOSIS — R531 Weakness: Secondary | ICD-10-CM

## 2017-05-06 NOTE — Telephone Encounter (Signed)
-----   Message from Cameron Sprang, MD sent at 05/06/2017  8:45 AM EDT ----- Pls let him know that one of the bloodwork for inflammation was normal, but the other was a little elevated. Sometimes it can be a lab error when it shows like this. Would like to repeat CRP to confirm. Thanks

## 2017-05-06 NOTE — Telephone Encounter (Signed)
Called pt and relayed message below.  He was slightly confused, thinking I was working with his eye doctor.  After repeating the message again, he realized that I was calling from his neurologist.  Will put orders in for repeat CRP

## 2017-05-06 NOTE — Telephone Encounter (Signed)
Pt daughter-in-law called to verify that pt does need to return for CRP blood draw.  Pt was confused, telling daughter-in-law that our results were showing inflammation in his eye.  Let her know that yes, he does need to have another blood draw, however we have nothing to do with his eye issue.  Pt daughter-in-law states that she will bring pt in next week for labwork

## 2017-05-07 DIAGNOSIS — J9620 Acute and chronic respiratory failure, unspecified whether with hypoxia or hypercapnia: Secondary | ICD-10-CM | POA: Diagnosis not present

## 2017-05-11 ENCOUNTER — Encounter (INDEPENDENT_AMBULATORY_CARE_PROVIDER_SITE_OTHER): Payer: Self-pay | Admitting: Ophthalmology

## 2017-05-26 ENCOUNTER — Encounter (INDEPENDENT_AMBULATORY_CARE_PROVIDER_SITE_OTHER): Payer: PPO | Admitting: Ophthalmology

## 2017-05-26 ENCOUNTER — Other Ambulatory Visit: Payer: PPO

## 2017-05-26 DIAGNOSIS — I10 Essential (primary) hypertension: Secondary | ICD-10-CM | POA: Diagnosis not present

## 2017-05-26 DIAGNOSIS — H43813 Vitreous degeneration, bilateral: Secondary | ICD-10-CM | POA: Diagnosis not present

## 2017-05-26 DIAGNOSIS — H35033 Hypertensive retinopathy, bilateral: Secondary | ICD-10-CM | POA: Diagnosis not present

## 2017-05-26 DIAGNOSIS — H35371 Puckering of macula, right eye: Secondary | ICD-10-CM | POA: Diagnosis not present

## 2017-05-26 DIAGNOSIS — D3132 Benign neoplasm of left choroid: Secondary | ICD-10-CM | POA: Diagnosis not present

## 2017-05-26 DIAGNOSIS — R531 Weakness: Secondary | ICD-10-CM | POA: Diagnosis not present

## 2017-05-26 DIAGNOSIS — H2513 Age-related nuclear cataract, bilateral: Secondary | ICD-10-CM

## 2017-05-26 DIAGNOSIS — H353122 Nonexudative age-related macular degeneration, left eye, intermediate dry stage: Secondary | ICD-10-CM

## 2017-05-26 NOTE — Progress Notes (Signed)
Cardiology Office Note  Date: 05/27/2017   ID: Dustin [name], DOB 11-04-1934, MRN [id number]  PCP: [name], MD  Primary Cardiologist: Rozann Lesches, MD   Chief Complaint  Patient presents with  . Coronary Artery Disease    History of Present Illness: Dustin [name] is an 83 y.o. male last seen by Ms. Dunn PA-C in March. He is here today with his wife. He does not report any obvious angina symptoms. Mainly complains of recurring headaches, also is anticipating cataract surgery. He has had no sudden dizziness or syncope.  I reviewed his cardiac testing from January and March as outlined below. Our plan at this point is to continue medical therapy and observation. He is no longer on any heart rate lowering medications specifically.  I reviewed his current cardiac regimen which includes Plavix, Lasix, Imdur, Pravachol, and as needed nitroglycerin. Blood pressure today is normal.  Past Medical History:  Diagnosis Date  . Alzheimer disease   . Anxiety   . Arthritis   . Atrophic gastritis    a. By EGD 02/2013.  . Carotid artery disease ([place])    a. mild-mod plaque, <50% stenosis bilat by duplex 2018.  [name] Coronary atherosclerosis of native coronary artery    a. Multivessel s/p CABG 1996. b. s/p DES x 2 SVG to PDA 8/12 with distal disease managed medically.  . DDD (degenerative disc disease)    Chronic back pain  . Enlarged prostate   . Essential hypertension, benign   . Hematuria   . Hypothyroidism   . LBBB (left bundle branch block)   . MI (myocardial infarction) ([place])   . Mixed hyperlipidemia   . OA (osteoarthritis)   . OSA (obstructive sleep apnea)   . Sinus bradycardia    a. Aricept and BB discontinued due to this.    Past Surgical History:  Procedure Laterality Date  . COLONOSCOPY  08/03/2004   Jenkins-numerous large diverticula in the descending, transverse, descending, and sigmoid colon. Otherwise normal exam.  . COLONOSCOPY  07/12/2012   RMR: External  hemorrhoidal tag; multiple rectal and colonic polyps removed and/or treated as described above. Pancolonic diverticulosis. Bx-tubular adenomas, rectal hyperplastic polyp. next colonoscopy in 06/2015.  [name] COLONOSCOPY N/A 06/22/2016   Procedure: COLONOSCOPY;  Surgeon: Aviva Signs, MD;  [facility];  Service: Gastroenterology;  Laterality: N/A;  730  . CORONARY ANGIOPLASTY WITH STENT PLACEMENT    . CORONARY ARTERY BYPASS GRAFT  1996   LIMA to LAD, SVG to D2, SVG to PDA, SVG to OM1 and OM2  . ESOPHAGOGASTRODUODENOSCOPY N/A 03/16/2013   Procedure: ESOPHAGOGASTRODUODENOSCOPY (EGD);  Surgeon: Daneil Dolin, MD;  Location: [facility];  Service: Endoscopy;  Laterality: N/A;  12:00-moved to [place] notified pt  . ESOPHAGOGASTRODUODENOSCOPY N/A 03/06/2013   Procedure: ESOPHAGOGASTRODUODENOSCOPY (EGD);  Surgeon: Daneil Dolin, MD;  [facility];  Service: Endoscopy;  Laterality: N/A;  . HERNIA REPAIR      Current Outpatient Prescriptions  Medication Sig Dispense Refill  . ALPRAZolam (XANAX) 1 MG tablet Take 1 tablet (1 mg total) by mouth 2 (two) times daily. For anxiety/sleep (Patient taking differently: Take 1.5 mg by mouth at bedtime as needed. For anxiety/sleep) 30 tablet 0  . clopidogrel (PLAVIX) 75 MG tablet Take 1 tablet (75 mg total) by mouth daily.    [name] escitalopram (LEXAPRO) 10 MG tablet Take 10 mg by mouth daily.    . furosemide (LASIX) 20 MG tablet Take 20 mg by mouth  daily.     . gabapentin (NEURONTIN) 300 MG capsule Take 1 capsule (300 mg total) by mouth 3 (three) times daily. 90 capsule 0  . isosorbide mononitrate (IMDUR) 30 MG 24 hr tablet Take 30 mg by mouth daily.    [name] levothyroxine (SYNTHROID, LEVOTHROID) 50 MCG tablet Take 50 mcg by mouth daily.    . memantine (NAMENDA) 10 MG tablet Take 1 tablet by mouth 2 (two) times daily.    . nitroGLYCERIN (NITROSTAT) 0.4 MG SL tablet PLACE ONE TABLET UNDER TONGUE EVERY 5 MIN UP TO 3 DOSES AS NEEDED FORCHEST PAIN. 25  tablet 3  . oxycodone (ROXICODONE) 30 MG immediate release tablet Take 15 mg by mouth 3 (three) times daily.    . pravastatin (PRAVACHOL) 40 MG tablet TAKE (1) TABLET BY MOUTH AT BEDTIME. 15 tablet 0  . tamsulosin (FLOMAX) 0.4 MG CAPS capsule Take 1 capsule by mouth daily.     No current facility-administered medications for this visit.    Allergies:  Patient has no known allergies.   Social History: The patient  reports that he quit smoking about 22 years ago. His smoking use included Cigarettes. He has a 80.00 pack-year smoking history. He has never used smokeless tobacco. He reports that he does not drink alcohol or use drugs.   ROS:  Please see the history of present illness. Otherwise, complete review of systems is positive for hearing loss, memory deficits.  All other systems are reviewed and negative.   Physical Exam: VS:  BP 122/68   Pulse (!) 58   Ht 5\' 9"  (1.753 m)   Wt 201 lb (91.2 kg)   SpO2 94%   BMI 29.68 kg/m , BMI Body mass index is 29.68 kg/m.  Wt Readings from Last 3 Encounters:  05/27/17 201 lb (91.2 kg)  05/02/17 201 lb (91.2 kg)  03/15/17 199 lb (90.3 kg)    General: Elderly male, appears comfortable at rest. HEENT: Conjunctiva and lids normal, oropharynx clear. Neck: Supple, no elevated JVP or carotid bruits, no thyromegaly. Lungs: Clear to auscultation, nonlabored breathing at rest. Cardiac: Regular rate and rhythm, no S3, soft systolic murmur, no pericardial rub. Abdomen: Soft, nontender, bowel sounds present, no guarding or rebound. Extremities: Mild leg edema, distal pulses 2+. Skin: Warm and dry. Musculoskeletal: No kyphosis. Neuropsychiatric: Alert and oriented x3, affect grossly appropriate.  ECG: I personally reviewed the tracing from 02/15/2017 which showed sinus bradycardia with incomplete LBBB.  Recent Labwork: 02/12/2017: Magnesium 2.0; TSH 3.893 02/14/2017: BUN 15; Creatinine, Ser 0.79; Potassium 3.8; Sodium 140 02/15/2017: Hemoglobin 10.7;  Platelets 201   Other Studies Reviewed Today:  Carotid Dopplers 03/08/2017: Less than 50% bilateral ICA stenoses.  Echocardiogram 03/08/2017: Study Conclusions  - Left ventricle: The cavity size was normal. Wall thickness was   increased in a pattern of mild LVH. Systolic function was normal.   The estimated ejection fraction was 55%. Doppler parameters are   consistent with abnormal left ventricular relaxation (grade 1   diastolic dysfunction). - Regional wall motion abnormality: Mild hypokinesis of the mid   inferior myocardium. - Aortic valve: Moderately calcified annulus. Trileaflet. There was   no stenosis. - Mitral valve: There was mild regurgitation. - Left atrium: The atrium was mildly to moderately dilated. - Right ventricle: Systolic function was mildly reduced. - Right atrium: The atrium was mildly dilated. - Tricuspid valve: There was mild regurgitation.  Cardiac monitor 03/01/2017: 7 day event recorder reviewed. Sinus rhythm noted throughout. Lowest recorded heart rate 44 bpm (  3:16 AM March 8) with upper heart rate 94 bpm. Average heart rate was in the upper 50s. There were no pauses.  Lexiscan Myoview 12/28/2016:  Inferior defect that shows partial improvement consistent with scar/soft tissue attenuation and periinfarct ischemia  LVEF 46%  Intermediate risk study  Assessment and Plan:  1. Multivessel CAD status post CABG in 1996 with subsequent DES 2 to the SVG to PDA in 2012. He has distal obstructive disease is being managed medically and underwent a Myoview within the last 6 months that showed mild peri-infarct inferior ischemia. He does not report any active angina at this point.  2. History of symptomatic bradycardia, significantly improved off of Aricept and beta blocker. No clear indication for pacemaker. Cardiac monitor from March is reviewed above. Continue observation.  3. Essential hypertension, blood pressure well controlled today.  4. Less than 50%  bilateral ICA stenoses, asymptomatic. He is on antiplatelet therapy and statin.  Current medicines were reviewed with the patient today.  Disposition: Follow-up in 6 months.  Signed, Satira Sark, MD, [facility] 05/27/2017 1:43 PM    [place] [facility] [address], [place], [place] [address] Phone: [phone]; Fax: [phone]

## 2017-05-27 ENCOUNTER — Telehealth: Payer: Self-pay

## 2017-05-27 ENCOUNTER — Ambulatory Visit (INDEPENDENT_AMBULATORY_CARE_PROVIDER_SITE_OTHER): Payer: PPO | Admitting: Cardiology

## 2017-05-27 ENCOUNTER — Encounter: Payer: Self-pay | Admitting: Cardiology

## 2017-05-27 VITALS — BP 122/68 | HR 58 | Ht 69.0 in | Wt 201.0 lb

## 2017-05-27 DIAGNOSIS — I779 Disorder of arteries and arterioles, unspecified: Secondary | ICD-10-CM

## 2017-05-27 DIAGNOSIS — I739 Peripheral vascular disease, unspecified: Secondary | ICD-10-CM

## 2017-05-27 DIAGNOSIS — I25810 Atherosclerosis of coronary artery bypass graft(s) without angina pectoris: Secondary | ICD-10-CM

## 2017-05-27 DIAGNOSIS — R001 Bradycardia, unspecified: Secondary | ICD-10-CM

## 2017-05-27 DIAGNOSIS — I10 Essential (primary) hypertension: Secondary | ICD-10-CM

## 2017-05-27 LAB — C-REACTIVE PROTEIN: CRP: 3.3 mg/L (ref <8.0)

## 2017-05-27 NOTE — Patient Instructions (Addendum)
Your physician wants you to follow-up in:  6 months with Dr McDowell You will receive a reminder letter in the mail two months in advance. If you don't receive a letter, please call our office to schedule the follow-up appointment.    Your physician recommends that you continue on your current medications as directed. Please refer to the Current Medication list given to you today.    If you need a refill on your cardiac medications before your next appointment, please call your pharmacy.      No testing or lab work ordered today.      Thank you for choosing [facility] !         

## 2017-05-27 NOTE — Telephone Encounter (Signed)
-----   Message from Alda Berthold, DO sent at 05/27/2017 12:17 PM EDT ----- Please notify patient his repeat CRP is within normal limits.  Thank you.

## 2017-05-27 NOTE — Telephone Encounter (Signed)
LMOM asking pt to contact our office for message below

## 2017-06-07 ENCOUNTER — Ambulatory Visit: Payer: PPO | Admitting: Neurology

## 2017-06-07 DIAGNOSIS — J9620 Acute and chronic respiratory failure, unspecified whether with hypoxia or hypercapnia: Secondary | ICD-10-CM | POA: Diagnosis not present

## 2017-06-21 DIAGNOSIS — H25811 Combined forms of age-related cataract, right eye: Secondary | ICD-10-CM | POA: Diagnosis not present

## 2017-06-21 DIAGNOSIS — H2511 Age-related nuclear cataract, right eye: Secondary | ICD-10-CM | POA: Diagnosis not present

## 2017-06-23 ENCOUNTER — Encounter: Payer: PPO | Admitting: Psychology

## 2017-06-25 IMAGING — CR DG CHEST 1V PORT
1 series · 1 of 1 positions shown · non-contrast
Comparison: 01/29/2015 and earlier, including CTA chest 08/31/2014.

CLINICAL DATA: 81-year-old presenting with acute onset of shortness
of breath. Hypoxia upon arrival to the emergency department. Prior
CABG.

EXAM:
PORTABLE CHEST 1 VIEW

[ap portable]
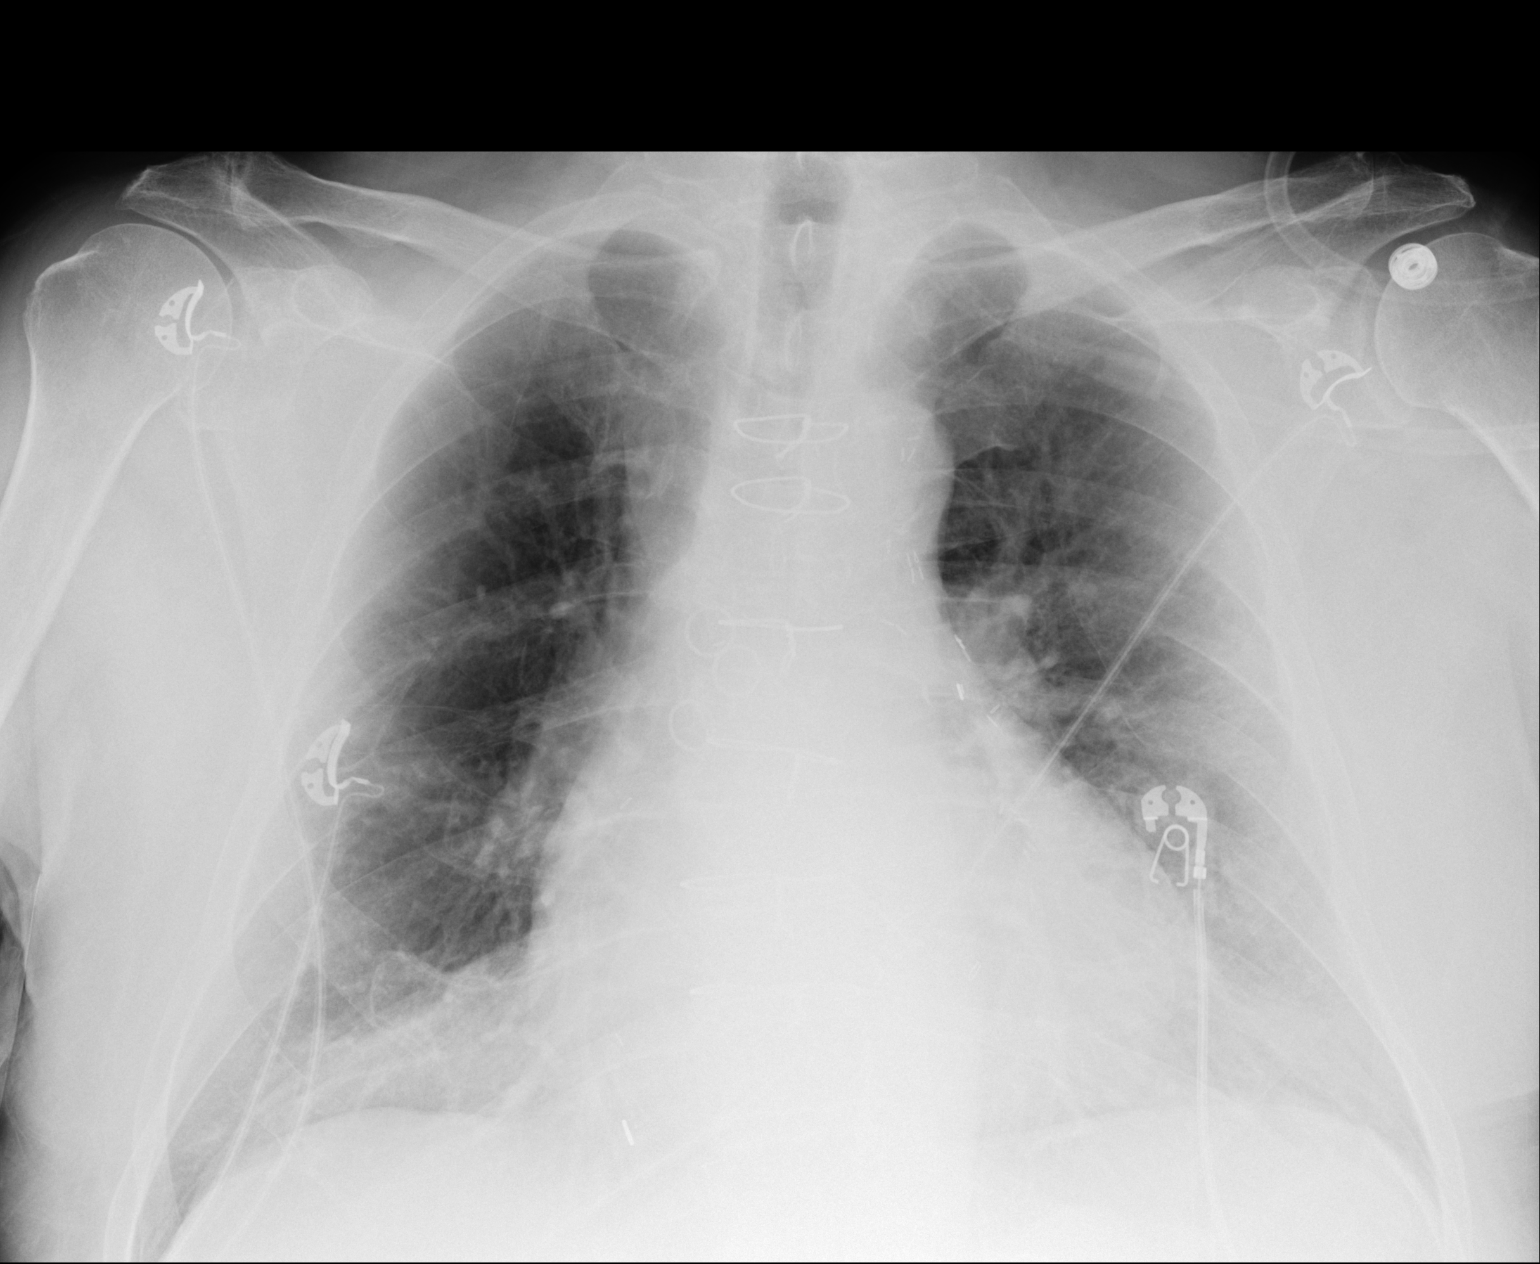

[1 of 1 positions shown; findings below may reference images not displayed]

FINDINGS: Prior sternotomy for CABG. Cardiac silhouette moderately enlarged,
unchanged. Mild pulmonary venous hypertension without overt edema.
Prominent right paracardiac fat pad as noted previously. Linear
scarring at the right lung base at the site of prior pneumonia.
Lungs otherwise clear. No confluent airspace consolidation. No
visible pleural effusions.
IMPRESSION: No acute cardiopulmonary disease. Stable moderate cardiomegaly. Mild
pulmonary venous hypertension without overt edema. Scarring in the
right lung base at the site of prior pneumonia.

## 2017-06-25 IMAGING — CT CT HEAD W/O CM
1 series · 16 of 30 positions shown, 20 images · non-contrast
Comparison: 08/30/2014

CLINICAL DATA: Confusion

EXAM:
CT HEAD WITHOUT CONTRAST
TECHNIQUE: Contiguous axial images were obtained from the base of the skull
through the vertex without intravenous contrast.

[Series 2: headseq 4.8 h37s · axial · 0.43mm/px · z∈[+157,+314]mm · 16 of 36 slices shown, 20 images]
[im 2/36  brain]
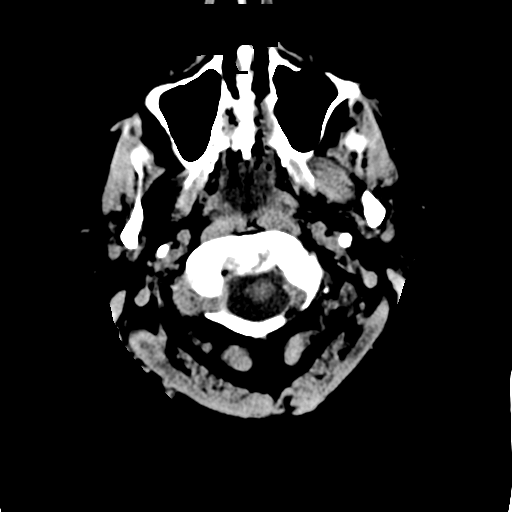
[im 2/36  bone]
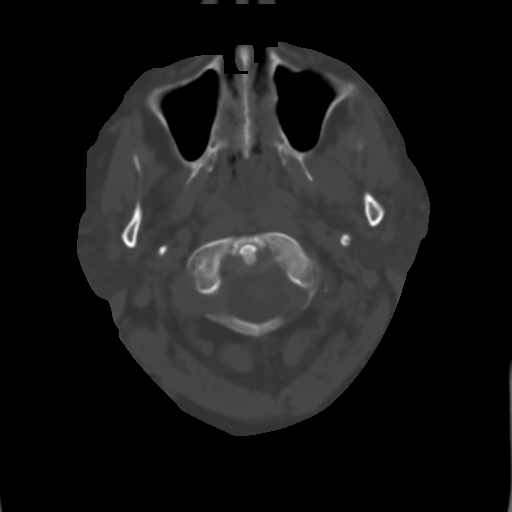
[im 4/36  brain]
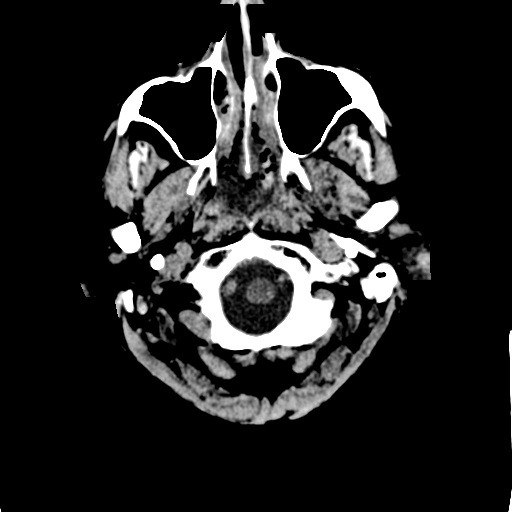
[im 7/36  brain]
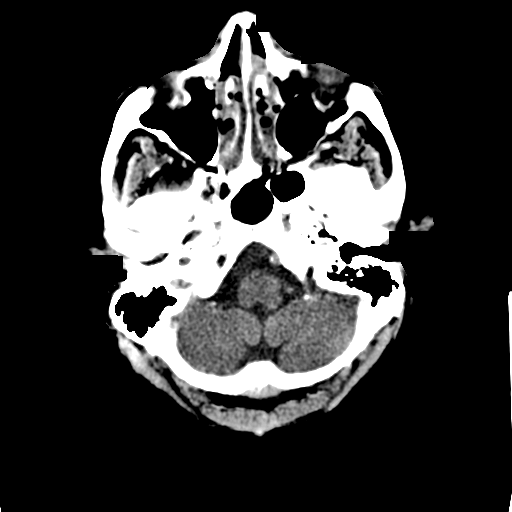
[im 9/36  brain]
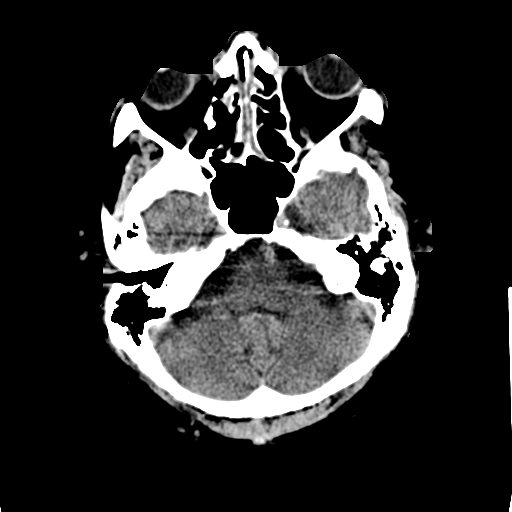
[im 10/36  brain]
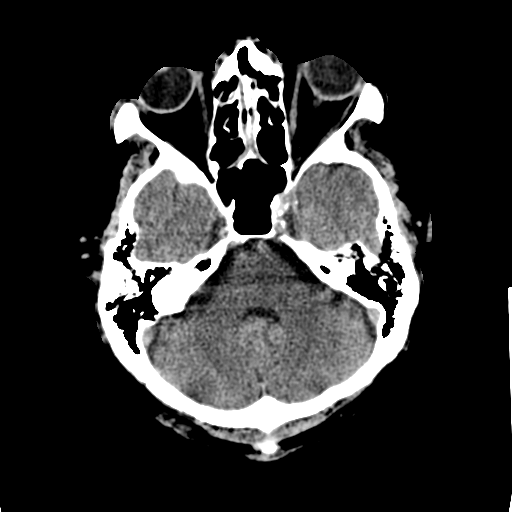
[im 10/36  bone]
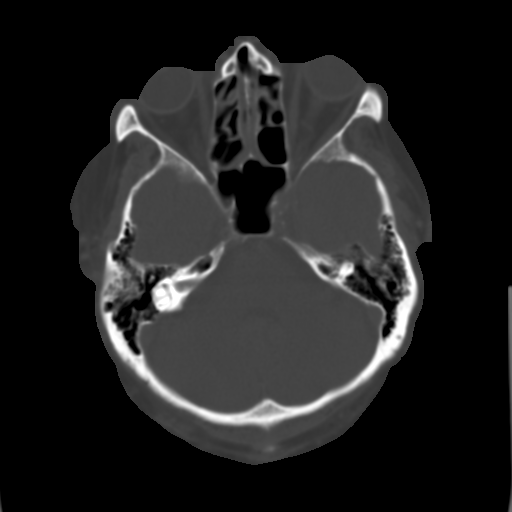
[im 13/36  brain]
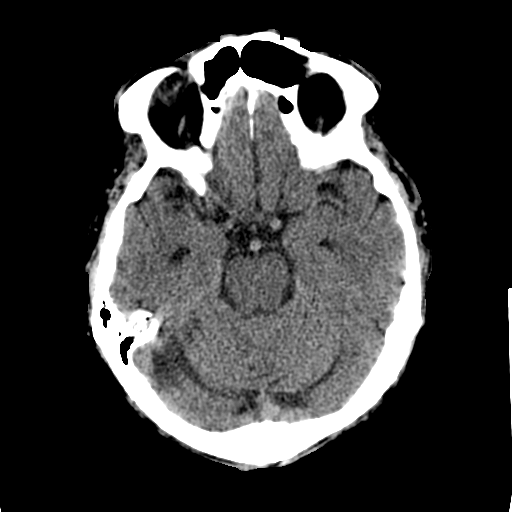
[im 15/36  brain]
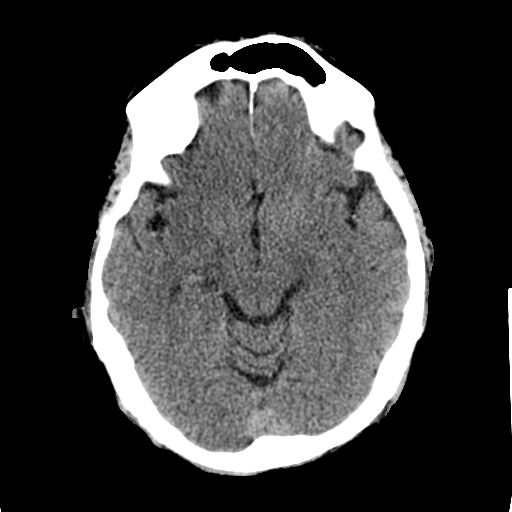
[im 17/36  brain]
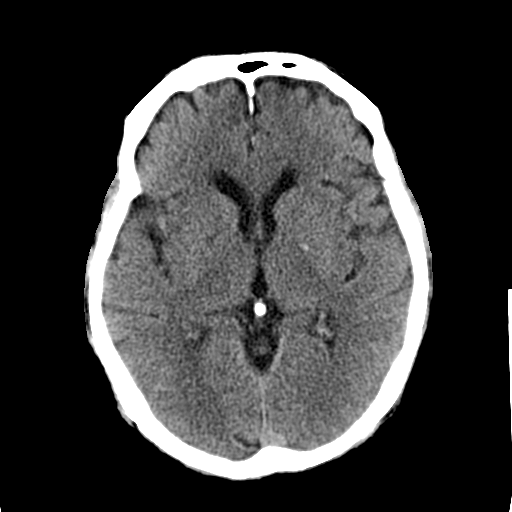
[im 19/36  brain]
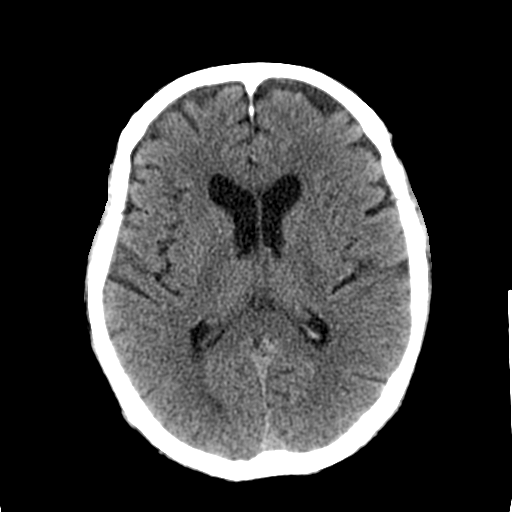
[im 19/36  bone]
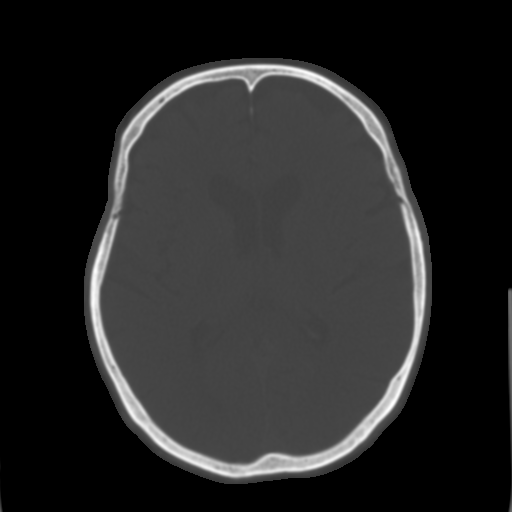
[im 21/36  brain]
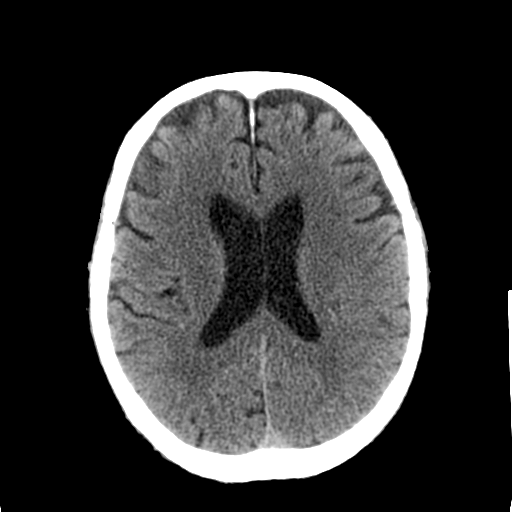
[im 23/36  brain]
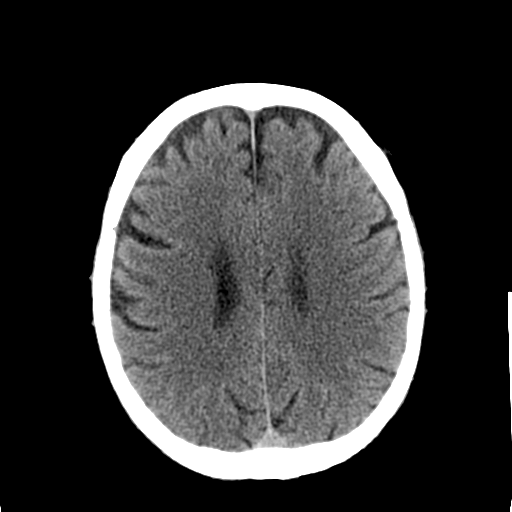
[im 26/36  brain]
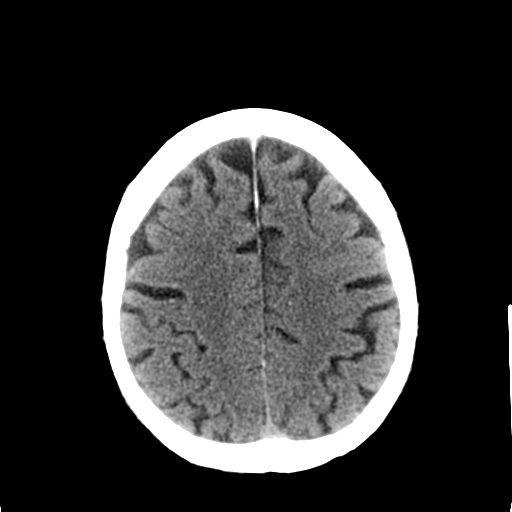
[im 27/36  brain]
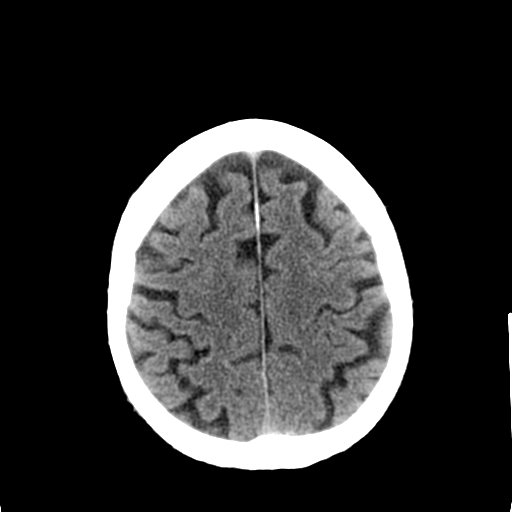
[im 27/36  bone]
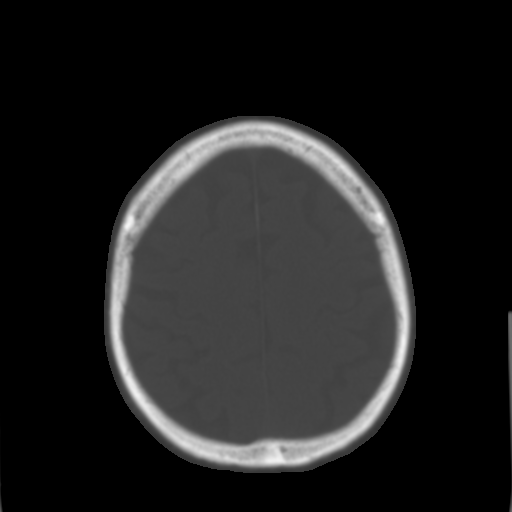
[im 29/36  brain]
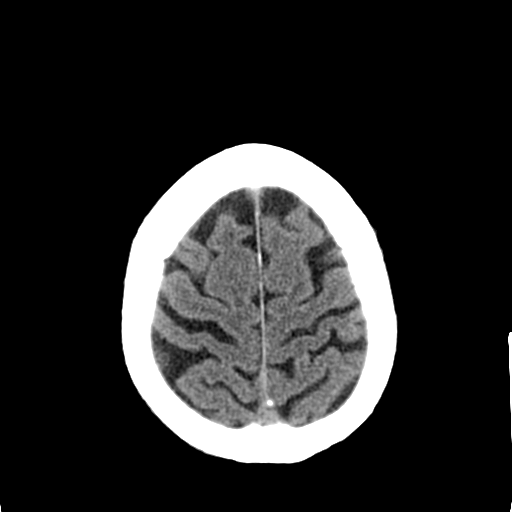
[im 32/36  brain]
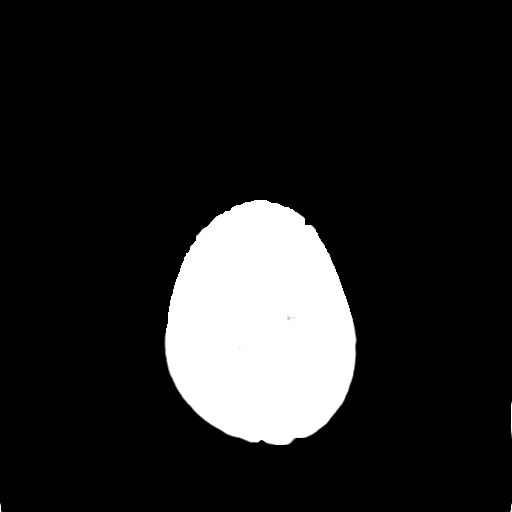
[im 34/36  brain]
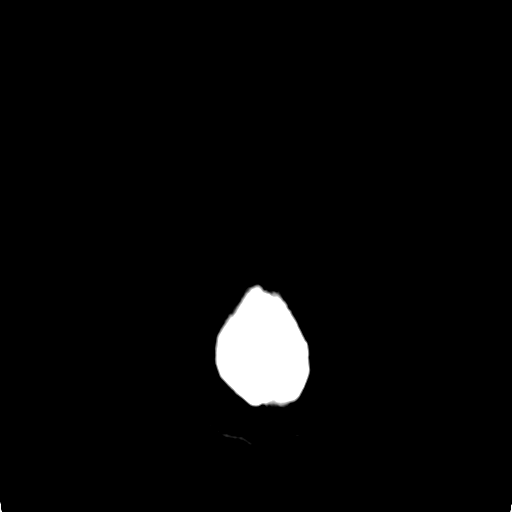

[16 of 30 positions shown; findings below may reference images not displayed]

FINDINGS: Bony calvarium is intact. Soft tissue density is noted within the
nasal passages consistent with mucus. The paranasal sinuses are
within normal limits.

Diffuse atrophic changes are seen. No findings to suggest acute
hemorrhage, acute infarction or space-occupying mass lesion are
noted.
IMPRESSION: Chronic atrophic changes without acute intracranial abnormality.

Increased soft tissue density within the nasal passages likely
related to mucus.

## 2017-07-06 DIAGNOSIS — N401 Enlarged prostate with lower urinary tract symptoms: Secondary | ICD-10-CM | POA: Diagnosis not present

## 2017-07-06 DIAGNOSIS — Z6827 Body mass index (BMI) 27.0-27.9, adult: Secondary | ICD-10-CM | POA: Diagnosis not present

## 2017-07-06 DIAGNOSIS — N411 Chronic prostatitis: Secondary | ICD-10-CM | POA: Diagnosis not present

## 2017-07-06 DIAGNOSIS — E063 Autoimmune thyroiditis: Secondary | ICD-10-CM | POA: Diagnosis not present

## 2017-07-06 DIAGNOSIS — R311 Benign essential microscopic hematuria: Secondary | ICD-10-CM | POA: Diagnosis not present

## 2017-07-06 DIAGNOSIS — G894 Chronic pain syndrome: Secondary | ICD-10-CM | POA: Diagnosis not present

## 2017-07-06 DIAGNOSIS — F419 Anxiety disorder, unspecified: Secondary | ICD-10-CM | POA: Diagnosis not present

## 2017-07-07 DIAGNOSIS — J9620 Acute and chronic respiratory failure, unspecified whether with hypoxia or hypercapnia: Secondary | ICD-10-CM | POA: Diagnosis not present

## 2017-07-13 DIAGNOSIS — H35351 Cystoid macular degeneration, right eye: Secondary | ICD-10-CM | POA: Diagnosis not present

## 2017-07-13 DIAGNOSIS — H35371 Puckering of macula, right eye: Secondary | ICD-10-CM | POA: Diagnosis not present

## 2017-07-16 ENCOUNTER — Ambulatory Visit
Admission: RE | Admit: 2017-07-16 | Discharge: 2017-07-16 | Disposition: A | Discharge status: Home / Self Care | Payer: PPO | Source: Ambulatory Visit | Attending: Neurology | Admitting: Neurology

## 2017-07-16 DIAGNOSIS — R519 Headache, unspecified: Secondary | ICD-10-CM

## 2017-07-16 DIAGNOSIS — R413 Other amnesia: Secondary | ICD-10-CM

## 2017-07-16 DIAGNOSIS — G300 Alzheimer's disease with early onset: Secondary | ICD-10-CM | POA: Diagnosis not present

## 2017-07-16 DIAGNOSIS — R51 Headache: Principal | ICD-10-CM

## 2017-07-18 ENCOUNTER — Telehealth: Payer: Self-pay | Admitting: *Deleted

## 2017-07-18 NOTE — Telephone Encounter (Signed)
Patient given results

## 2017-07-18 NOTE — Telephone Encounter (Signed)
-----   Message from Cameron Sprang, MD sent at 07/18/2017  3:14 PM EDT ----- Pls let patient know I reviewed MRI brain, no evidence of tumor, stroke, or bleed. It shows age-related changes. Thanks

## 2017-07-18 NOTE — Telephone Encounter (Signed)
Patient and wife given results.

## 2017-07-19 ENCOUNTER — Encounter: Payer: PPO | Admitting: Psychology

## 2017-08-01 ENCOUNTER — Ambulatory Visit (INDEPENDENT_AMBULATORY_CARE_PROVIDER_SITE_OTHER): Payer: PPO | Admitting: Psychology

## 2017-08-01 DIAGNOSIS — R413 Other amnesia: Secondary | ICD-10-CM | POA: Diagnosis not present

## 2017-08-01 NOTE — Progress Notes (Signed)
   Neuropsychology Note  Dustin [name] came in today for 1 hour of neuropsychological testing with technician, Dustin [name], BS, under the supervision of Dr. Macarthur Critchley. The patient did not appear overtly distressed by the testing session, per behavioral observation or via self-report to the technician. Rest breaks were offered. Dustin [name] will return within 2 weeks for a feedback session with Dustin [name] at which time his test performances, clinical impressions and treatment recommendations will be reviewed in detail. The patient understands he can contact our office should he require our assistance before this time.  Full report to follow.
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
## 2017-08-02 ENCOUNTER — Encounter: Payer: Self-pay | Admitting: Psychology

## 2017-08-07 DIAGNOSIS — J9620 Acute and chronic respiratory failure, unspecified whether with hypoxia or hypercapnia: Secondary | ICD-10-CM | POA: Diagnosis not present

## 2017-08-10 ENCOUNTER — Encounter (INDEPENDENT_AMBULATORY_CARE_PROVIDER_SITE_OTHER): Payer: PPO | Admitting: Ophthalmology

## 2017-08-10 DIAGNOSIS — H2512 Age-related nuclear cataract, left eye: Secondary | ICD-10-CM

## 2017-08-10 DIAGNOSIS — H59031 Cystoid macular edema following cataract surgery, right eye: Secondary | ICD-10-CM

## 2017-08-10 DIAGNOSIS — I10 Essential (primary) hypertension: Secondary | ICD-10-CM

## 2017-08-10 DIAGNOSIS — H35033 Hypertensive retinopathy, bilateral: Secondary | ICD-10-CM

## 2017-08-10 DIAGNOSIS — H35371 Puckering of macula, right eye: Secondary | ICD-10-CM | POA: Diagnosis not present

## 2017-08-10 DIAGNOSIS — H43813 Vitreous degeneration, bilateral: Secondary | ICD-10-CM

## 2017-08-30 DIAGNOSIS — L820 Inflamed seborrheic keratosis: Secondary | ICD-10-CM | POA: Diagnosis not present

## 2017-08-30 DIAGNOSIS — L821 Other seborrheic keratosis: Secondary | ICD-10-CM | POA: Diagnosis not present
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
## 2017-09-05 ENCOUNTER — Encounter: Payer: Self-pay | Admitting: Psychology

## 2017-09-05 ENCOUNTER — Ambulatory Visit (INDEPENDENT_AMBULATORY_CARE_PROVIDER_SITE_OTHER): Payer: PPO | Admitting: Psychology

## 2017-09-05 DIAGNOSIS — F0280 Dementia in other diseases classified elsewhere without behavioral disturbance: Secondary | ICD-10-CM | POA: Diagnosis not present

## 2017-09-05 DIAGNOSIS — G301 Alzheimer's disease with late onset: Secondary | ICD-10-CM

## 2017-09-05 NOTE — Patient Instructions (Signed)
Clinical Impressions: Mild dementia, most likely due to Alzheimer's disease. While hearing could certainly be contributing to encoding of new information in daily life, his performances on testing suggest more of a consolidation problem than an encoding problem. Additionally, semantic retrieval deficits were deficient. This cognitive profile is consistent with medial temporal lobe involvement most commonly seen in Alzheimer's disease. He also demonstrated some aspects of executive dysfunction including difficulty with abstract reasoning and mental flexibility.  Taken together, his cognitive profile and clinical features are most consistent with mild dementia due to Alzheimer's disease. Despite denying depression or anxiety during the clinical interview, his responses on self-report questionnaires suggest significant levels of both, and this may be exacerbating underlying cognitive dysfunction. Additionally, regular use of prescribed pain medication and alprazolam could also worsen underlying cognitive dysfunction.   Recommendations/Plan: Based on the findings of the present evaluation, the following recommendations are offered:  1. The patient's family should monitor his management of medications and finances to ensure mistakes are not being made.  2. The patient is seeking renewal of his driver's license. The results of this cognitive evaluation may assist in this determination. Specifically, he did not demonstrate impaired processing speed or visual spatial skills, but he performed poorly on a cognitive test highly correlated with driving ability (Trails B). Additionally, his memory loss puts him at risk for getting lost. Mild Alzheimer's disease does not preclude an individual from safely driving, but in his case I would recommend a formal driving evaluation to better delineate his functional driving ability. It is possible that he could continue to drive safely in local, familiar locations with minimal  traffic during the day. 3. Advance directives and care planning are encouraged, including designation of PoA, completion of living will, and estate planning, if these have not yet been completed.  4. Routine and structure in his daily activities is recommended. Ongoing social interaction and safe cardiovascular exercise are also recommended in order to optimize brain health and quality of life.

## 2017-09-06 ENCOUNTER — Ambulatory Visit: Payer: PPO | Admitting: Neurology

## 2017-09-07 DIAGNOSIS — J9620 Acute and chronic respiratory failure, unspecified whether with hypoxia or hypercapnia: Secondary | ICD-10-CM | POA: Diagnosis not present

## 2017-09-09 DIAGNOSIS — E663 Overweight: Secondary | ICD-10-CM | POA: Diagnosis not present

## 2017-09-09 DIAGNOSIS — I10 Essential (primary) hypertension: Secondary | ICD-10-CM | POA: Diagnosis not present

## 2017-09-09 DIAGNOSIS — G894 Chronic pain syndrome: Secondary | ICD-10-CM | POA: Diagnosis not present

## 2017-09-09 DIAGNOSIS — Z23 Encounter for immunization: Secondary | ICD-10-CM | POA: Diagnosis not present

## 2017-09-09 DIAGNOSIS — F0390 Unspecified dementia without behavioral disturbance: Secondary | ICD-10-CM | POA: Diagnosis not present

## 2017-09-09 DIAGNOSIS — Z6828 Body mass index (BMI) 28.0-28.9, adult: Secondary | ICD-10-CM | POA: Diagnosis not present

## 2017-09-21 ENCOUNTER — Encounter (INDEPENDENT_AMBULATORY_CARE_PROVIDER_SITE_OTHER): Payer: PPO | Admitting: Ophthalmology

## 2017-09-21 DIAGNOSIS — H35033 Hypertensive retinopathy, bilateral: Secondary | ICD-10-CM | POA: Diagnosis not present

## 2017-09-21 DIAGNOSIS — H59031 Cystoid macular edema following cataract surgery, right eye: Secondary | ICD-10-CM

## 2017-09-21 DIAGNOSIS — H35371 Puckering of macula, right eye: Secondary | ICD-10-CM | POA: Diagnosis not present

## 2017-09-21 DIAGNOSIS — H353122 Nonexudative age-related macular degeneration, left eye, intermediate dry stage: Secondary | ICD-10-CM

## 2017-09-21 DIAGNOSIS — H43813 Vitreous degeneration, bilateral: Secondary | ICD-10-CM | POA: Diagnosis not present

## 2017-09-21 DIAGNOSIS — I10 Essential (primary) hypertension: Secondary | ICD-10-CM | POA: Diagnosis not present

## 2017-10-07 DIAGNOSIS — J9620 Acute and chronic respiratory failure, unspecified whether with hypoxia or hypercapnia: Secondary | ICD-10-CM | POA: Diagnosis not present

## 2017-10-08 ENCOUNTER — Other Ambulatory Visit: Payer: Self-pay | Admitting: Cardiology

## 2017-11-07 ENCOUNTER — Other Ambulatory Visit: Payer: Self-pay | Admitting: Cardiology

## 2017-11-07 DIAGNOSIS — J9620 Acute and chronic respiratory failure, unspecified whether with hypoxia or hypercapnia: Secondary | ICD-10-CM | POA: Diagnosis not present

## 2017-11-21 ENCOUNTER — Encounter (INDEPENDENT_AMBULATORY_CARE_PROVIDER_SITE_OTHER): Payer: PPO | Admitting: Ophthalmology

## 2017-11-21 DIAGNOSIS — H35033 Hypertensive retinopathy, bilateral: Secondary | ICD-10-CM | POA: Diagnosis not present

## 2017-11-21 DIAGNOSIS — I10 Essential (primary) hypertension: Secondary | ICD-10-CM | POA: Diagnosis not present

## 2017-11-21 DIAGNOSIS — H353122 Nonexudative age-related macular degeneration, left eye, intermediate dry stage: Secondary | ICD-10-CM | POA: Diagnosis not present

## 2017-11-21 DIAGNOSIS — H43813 Vitreous degeneration, bilateral: Secondary | ICD-10-CM

## 2017-11-21 DIAGNOSIS — H35371 Puckering of macula, right eye: Secondary | ICD-10-CM | POA: Diagnosis not present

## 2017-11-21 DIAGNOSIS — H59031 Cystoid macular edema following cataract surgery, right eye: Secondary | ICD-10-CM

## 2017-11-29 ENCOUNTER — Encounter: Payer: Self-pay | Admitting: *Deleted

## 2017-11-29 ENCOUNTER — Ambulatory Visit: Payer: PPO | Admitting: Cardiology

## 2017-11-29 VITALS — BP 136/64 | HR 64 | Ht 69.0 in | Wt 209.0 lb

## 2017-11-29 DIAGNOSIS — R001 Bradycardia, unspecified: Secondary | ICD-10-CM

## 2017-11-29 DIAGNOSIS — I10 Essential (primary) hypertension: Secondary | ICD-10-CM | POA: Diagnosis not present

## 2017-11-29 DIAGNOSIS — I25119 Atherosclerotic heart disease of native coronary artery with unspecified angina pectoris: Secondary | ICD-10-CM

## 2017-11-29 DIAGNOSIS — I6523 Occlusion and stenosis of bilateral carotid arteries: Secondary | ICD-10-CM | POA: Diagnosis not present

## 2017-11-29 MED ORDER — ISOSORBIDE MONONITRATE ER 30 MG PO TB24: 30.0000 mg | ORAL_TABLET | Freq: Two times a day (BID) | ORAL | 3 refills | Status: DC

## 2017-11-29 NOTE — Progress Notes (Signed)
Cardiology Office Note  Date: 11/29/2017   ID: Dustin [name], DOB September 21, 1934, MRN [id number]  PCP: [name], MD  Primary Cardiologist: Rozann Lesches, MD   Chief Complaint  Patient presents with  . Coronary Artery Disease    History of Present Illness: Dustin [name] is an 83 y.o. male last seen in August.  He is here today for a routine follow-up visit.  He reports intermittent angina symptoms requiring nitroglycerin use.  Otherwise states that he has been compliant with his medications.  He is fairly sedentary at baseline.  I reviewed his cardiac regimen.  He is no longer on AV nodal blockers given bradycardia.  Medications include Plavix, Imdur, Pravachol, and as needed nitroglycerin.  We discussed increasing his Imdur dose.  We discussed his cardiac testing from earlier in the year.  Plan is to continue with medical therapy adjustments at this point.  Heart rate is in the 60s today.  He does not report any dizziness or syncope.  Past Medical History:  Diagnosis Date  . Alzheimer disease   . Anxiety   . Arthritis   . Atrophic gastritis    a. By EGD 02/2013.  . Carotid artery disease ([place])    a. mild-mod plaque, <50% stenosis bilat by duplex 2018.  [name] Coronary atherosclerosis of native coronary artery    a. Multivessel s/p CABG 1996. b. s/p DES x 2 SVG to PDA 8/12 with distal disease managed medically.  . DDD (degenerative disc disease)    Chronic back pain  . Enlarged prostate   . Essential hypertension, benign   . Hematuria   . Hypothyroidism   . LBBB (left bundle branch block)   . MI (myocardial infarction) ([place])   . Mixed hyperlipidemia   . OA (osteoarthritis)   . OSA (obstructive sleep apnea)   . Sinus bradycardia    a. Aricept and BB discontinued due to this.    Past Surgical History:  Procedure Laterality Date  . COLONOSCOPY  08/03/2004   Jenkins-numerous large diverticula in the descending, transverse, descending, and sigmoid colon. Otherwise  normal exam.  . COLONOSCOPY  07/12/2012   RMR: External hemorrhoidal tag; multiple rectal and colonic polyps removed and/or treated as described above. Pancolonic diverticulosis. Bx-tubular adenomas, rectal hyperplastic polyp. next colonoscopy in 06/2015.  [name] COLONOSCOPY N/A 06/22/2016   Procedure: COLONOSCOPY;  Surgeon: Aviva Signs, MD;  [facility];  Service: Gastroenterology;  Laterality: N/A;  730  . CORONARY ANGIOPLASTY WITH STENT PLACEMENT    . CORONARY ARTERY BYPASS GRAFT  1996   LIMA to LAD, SVG to D2, SVG to PDA, SVG to OM1 and OM2  . ESOPHAGOGASTRODUODENOSCOPY N/A 03/16/2013   Procedure: ESOPHAGOGASTRODUODENOSCOPY (EGD);  Surgeon: Daneil Dolin, MD;  [facility];  Service: Endoscopy;  Laterality: N/A;  12:00-moved to [place] notified pt  . ESOPHAGOGASTRODUODENOSCOPY N/A 03/06/2013   Procedure: ESOPHAGOGASTRODUODENOSCOPY (EGD);  Surgeon: Daneil Dolin, MD;  [facility];  Service: Endoscopy;  Laterality: N/A;  . HERNIA REPAIR      Current Outpatient Medications  Medication Sig Dispense Refill  . ALPRAZolam (XANAX) 1 MG tablet Take 1 tablet (1 mg total) by mouth 2 (two) times daily. For anxiety/sleep (Patient taking differently: Take 1.5 mg by mouth at bedtime as needed. For anxiety/sleep) 30 tablet 0  . clopidogrel (PLAVIX) 75 MG tablet Take 1 tablet (75 mg total) by mouth daily.    [name] escitalopram (LEXAPRO) 10 MG tablet Take 10 mg by  mouth daily.    . furosemide (LASIX) 20 MG tablet Take 20 mg by mouth daily.     [name] gabapentin (NEURONTIN) 300 MG capsule Take 1 capsule (300 mg total) by mouth 3 (three) times daily. 90 capsule 0  . levothyroxine (SYNTHROID, LEVOTHROID) 50 MCG tablet Take 50 mcg by mouth daily.    . memantine (NAMENDA) 10 MG tablet Take 1 tablet by mouth 2 (two) times daily.    . nitroGLYCERIN (NITROSTAT) 0.4 MG SL tablet PLACE ONE TABLET UNDER TONGUE EVERY 5 MIN UP TO 3 DOSES AS NEEDED FORCHEST PAIN. 25 tablet 3  . oxycodone  (ROXICODONE) 30 MG immediate release tablet Take 15 mg by mouth 3 (three) times daily.    . pravastatin (PRAVACHOL) 40 MG tablet TAKE (1) TABLET BY MOUTH AT BEDTIME. 15 tablet 0  . tamsulosin (FLOMAX) 0.4 MG CAPS capsule Take 1 capsule by mouth daily.    . isosorbide mononitrate (IMDUR) 30 MG 24 hr tablet Take 1 tablet (30 mg total) by mouth 2 (two) times daily. 180 tablet 3   No current facility-administered medications for this visit.    Allergies:  Patient has no known allergies.   Social History: The patient  reports that he quit smoking about 22 years ago. His smoking use included cigarettes. He has a 80.00 pack-year smoking history. he has never used smokeless tobacco. He reports that he does not drink alcohol or use drugs.   ROS:  Please see the history of present illness. Otherwise, complete review of systems is positive for hearing loss.  All other systems are reviewed and negative.   Physical Exam: VS:  BP 136/64   Pulse 64   Ht 5\' 9"  (1.753 m)   Wt 209 lb (94.8 kg)   SpO2 97%   BMI 30.86 kg/m , BMI Body mass index is 30.86 kg/m.  Wt Readings from Last 3 Encounters:  11/29/17 209 lb (94.8 kg)  05/27/17 201 lb (91.2 kg)  05/02/17 201 lb (91.2 kg)    General: Elderly male, appears comfortable at rest. HEENT: Conjunctiva and lids normal, oropharynx clear. Neck: Supple, no elevated JVP or carotid bruits, no thyromegaly. Lungs: Clear to auscultation, nonlabored breathing at rest. Cardiac: Regular rate and rhythm, no S3, soft systolic murmur, no pericardial rub. Abdomen: Soft, nontender, bowel sounds present, no guarding or rebound. Extremities: No pitting edema, distal pulses 2+. Skin: Warm and dry. Musculoskeletal: No kyphosis. Neuropsychiatric: Alert and oriented x3, affect grossly appropriate.  ECG: I personally reviewed the tracing from 01/28/2017 which showed sinus bradycardia with IVCD.  Recent Labwork: 02/12/2017: Magnesium 2.0; TSH 3.893 02/14/2017: BUN 15;  Creatinine, Ser 0.79; Potassium 3.8; Sodium 140 02/15/2017: Hemoglobin 10.7; Platelets 201   Other Studies Reviewed Today:  Echocardiogram 03/08/2017: Study Conclusions  - Left ventricle: The cavity size was normal. Wall thickness was   increased in a pattern of mild LVH. Systolic function was normal.   The estimated ejection fraction was 55%. Doppler parameters are   consistent with abnormal left ventricular relaxation (grade 1   diastolic dysfunction). - Regional wall motion abnormality: Mild hypokinesis of the mid   inferior myocardium. - Aortic valve: Moderately calcified annulus. Trileaflet. There was   no stenosis. - Mitral valve: There was mild regurgitation. - Left atrium: The atrium was mildly to moderately dilated. - Right ventricle: Systolic function was mildly reduced. - Right atrium: The atrium was mildly dilated. - Tricuspid valve: There was mild regurgitation.  Carotid artery disease 03/08/2017: IMPRESSION: Mild-to-moderate bilateral carotid atherosclerosis.  No hemodynamically significant ICA stenosis. Degree of narrowing less than 50% bilaterally.  Patent antegrade vertebral flow bilaterally  Lexiscan Myoview 12/28/2016:  Inferior defect that shows partial improvement consistent with scar/soft tissue attenuation and periinfarct ischemia  LVEF 46%  Intermediate risk study  Assessment and Plan:  1.  Multivessel CAD status post CABG in 1996 with subsequent DES x2 to the SVG to PDA in 2012.  Myoview from earlier in the year showed region of inferior soft tissue attenuation and peri-infarct ischemia and we continue with medical therapy.  Increase Imdur to 30 mg twice daily.  2.  History of symptomatic bradycardia necessitating discontinuation of Aricept and beta-blocker.  Heart rate is in the 60s today.  3.  Asymptomatic carotid artery stenosis.  Continue antiplatelet regimen and statin.  4.  Essential hypertension, no changes made to current regimen.  Current  medicines were reviewed with the patient today.  Disposition: Follow-up in 6 months.  Signed, Satira Sark, MD, [facility] 11/29/2017 2:25 PM    [place] at [facility] [address], [place], [place] [address] Phone: [phone]; Fax: [phone]

## 2017-11-29 NOTE — Patient Instructions (Addendum)
Your physician wants you to follow-up in:  6 months with Dr.McDowell You will receive a reminder letter in the mail two months in advance. If you don't receive a letter, please call our office to schedule the follow-up appointment.    INCREASE Imdur 30 mg twice a day    All other medications stay the same     No lab work or tests ordered today     Thank you for choosing [facility] !

## 2017-12-07 DIAGNOSIS — J9620 Acute and chronic respiratory failure, unspecified whether with hypoxia or hypercapnia: Secondary | ICD-10-CM | POA: Diagnosis not present

## 2017-12-21 DIAGNOSIS — Z1389 Encounter for screening for other disorder: Secondary | ICD-10-CM | POA: Diagnosis not present

## 2017-12-21 DIAGNOSIS — J441 Chronic obstructive pulmonary disease with (acute) exacerbation: Secondary | ICD-10-CM | POA: Diagnosis not present

## 2017-12-21 DIAGNOSIS — J22 Unspecified acute lower respiratory infection: Secondary | ICD-10-CM | POA: Diagnosis not present

## 2017-12-21 DIAGNOSIS — E663 Overweight: Secondary | ICD-10-CM | POA: Diagnosis not present

## 2017-12-21 DIAGNOSIS — Z6829 Body mass index (BMI) 29.0-29.9, adult: Secondary | ICD-10-CM | POA: Diagnosis not present

## 2018-01-07 DIAGNOSIS — J9620 Acute and chronic respiratory failure, unspecified whether with hypoxia or hypercapnia: Secondary | ICD-10-CM | POA: Diagnosis not present

## 2018-01-10 DIAGNOSIS — L821 Other seborrheic keratosis: Secondary | ICD-10-CM | POA: Diagnosis not present

## 2018-01-10 DIAGNOSIS — Z1283 Encounter for screening for malignant neoplasm of skin: Secondary | ICD-10-CM | POA: Diagnosis not present

## 2018-01-11 DIAGNOSIS — H9113 Presbycusis, bilateral: Secondary | ICD-10-CM | POA: Diagnosis not present

## 2018-01-11 DIAGNOSIS — I10 Essential (primary) hypertension: Secondary | ICD-10-CM | POA: Diagnosis not present

## 2018-01-11 DIAGNOSIS — M1991 Primary osteoarthritis, unspecified site: Secondary | ICD-10-CM | POA: Diagnosis not present

## 2018-01-11 DIAGNOSIS — G894 Chronic pain syndrome: Secondary | ICD-10-CM | POA: Diagnosis not present

## 2018-01-11 DIAGNOSIS — F419 Anxiety disorder, unspecified: Secondary | ICD-10-CM | POA: Diagnosis not present

## 2018-01-11 DIAGNOSIS — E663 Overweight: Secondary | ICD-10-CM | POA: Diagnosis not present

## 2018-01-11 DIAGNOSIS — I5032 Chronic diastolic (congestive) heart failure: Secondary | ICD-10-CM | POA: Diagnosis not present

## 2018-01-11 DIAGNOSIS — Z6829 Body mass index (BMI) 29.0-29.9, adult: Secondary | ICD-10-CM | POA: Diagnosis not present

## 2018-01-11 DIAGNOSIS — M2550 Pain in unspecified joint: Secondary | ICD-10-CM | POA: Diagnosis not present

## 2018-01-12 DIAGNOSIS — E782 Mixed hyperlipidemia: Secondary | ICD-10-CM | POA: Diagnosis not present

## 2018-01-12 DIAGNOSIS — I2510 Atherosclerotic heart disease of native coronary artery without angina pectoris: Secondary | ICD-10-CM | POA: Diagnosis not present

## 2018-01-12 DIAGNOSIS — Z79891 Long term (current) use of opiate analgesic: Secondary | ICD-10-CM | POA: Diagnosis not present

## 2018-01-12 DIAGNOSIS — E748 Other specified disorders of carbohydrate metabolism: Secondary | ICD-10-CM | POA: Diagnosis not present

## 2018-01-16 DIAGNOSIS — R7309 Other abnormal glucose: Secondary | ICD-10-CM | POA: Diagnosis not present

## 2018-01-26 ENCOUNTER — Other Ambulatory Visit (HOSPITAL_COMMUNITY): Payer: Self-pay | Admitting: Family Medicine

## 2018-01-26 ENCOUNTER — Ambulatory Visit (HOSPITAL_COMMUNITY)
Admission: RE | Admit: 2018-01-26 | Discharge: 2018-01-26 | Disposition: A | Discharge status: Home / Self Care | Payer: PPO | Source: Ambulatory Visit | Attending: Family Medicine | Admitting: Family Medicine

## 2018-01-26 DIAGNOSIS — R0789 Other chest pain: Secondary | ICD-10-CM | POA: Diagnosis not present

## 2018-01-26 DIAGNOSIS — E039 Hypothyroidism, unspecified: Secondary | ICD-10-CM | POA: Diagnosis not present

## 2018-01-26 DIAGNOSIS — E785 Hyperlipidemia, unspecified: Secondary | ICD-10-CM | POA: Diagnosis not present

## 2018-01-26 DIAGNOSIS — R918 Other nonspecific abnormal finding of lung field: Secondary | ICD-10-CM | POA: Insufficient documentation

## 2018-01-26 DIAGNOSIS — I10 Essential (primary) hypertension: Secondary | ICD-10-CM | POA: Diagnosis not present

## 2018-01-26 DIAGNOSIS — J984 Other disorders of lung: Secondary | ICD-10-CM | POA: Diagnosis not present

## 2018-01-26 DIAGNOSIS — Z6829 Body mass index (BMI) 29.0-29.9, adult: Secondary | ICD-10-CM | POA: Diagnosis not present

## 2018-01-26 DIAGNOSIS — I2510 Atherosclerotic heart disease of native coronary artery without angina pectoris: Secondary | ICD-10-CM | POA: Diagnosis not present

## 2018-02-07 ENCOUNTER — Other Ambulatory Visit: Payer: Self-pay | Admitting: Cardiology

## 2018-02-07 DIAGNOSIS — J9620 Acute and chronic respiratory failure, unspecified whether with hypoxia or hypercapnia: Secondary | ICD-10-CM | POA: Diagnosis not present

## 2018-02-13 ENCOUNTER — Ambulatory Visit (INDEPENDENT_AMBULATORY_CARE_PROVIDER_SITE_OTHER): Payer: PPO | Admitting: Otolaryngology

## 2018-02-13 DIAGNOSIS — H903 Sensorineural hearing loss, bilateral: Secondary | ICD-10-CM | POA: Diagnosis not present

## 2018-03-07 DIAGNOSIS — J9620 Acute and chronic respiratory failure, unspecified whether with hypoxia or hypercapnia: Secondary | ICD-10-CM | POA: Diagnosis not present

## 2018-03-16 IMAGING — DX DG CHEST 2V
2 series · 2 of 2 positions shown · non-contrast
Comparison: 08/22/2016

CLINICAL DATA: Bilateral lower extremity pain worsening over the
past year. Complains of generalized chest pain. Former smoker and
CABG.

EXAM:
CHEST  2 VIEW

[chest pa]
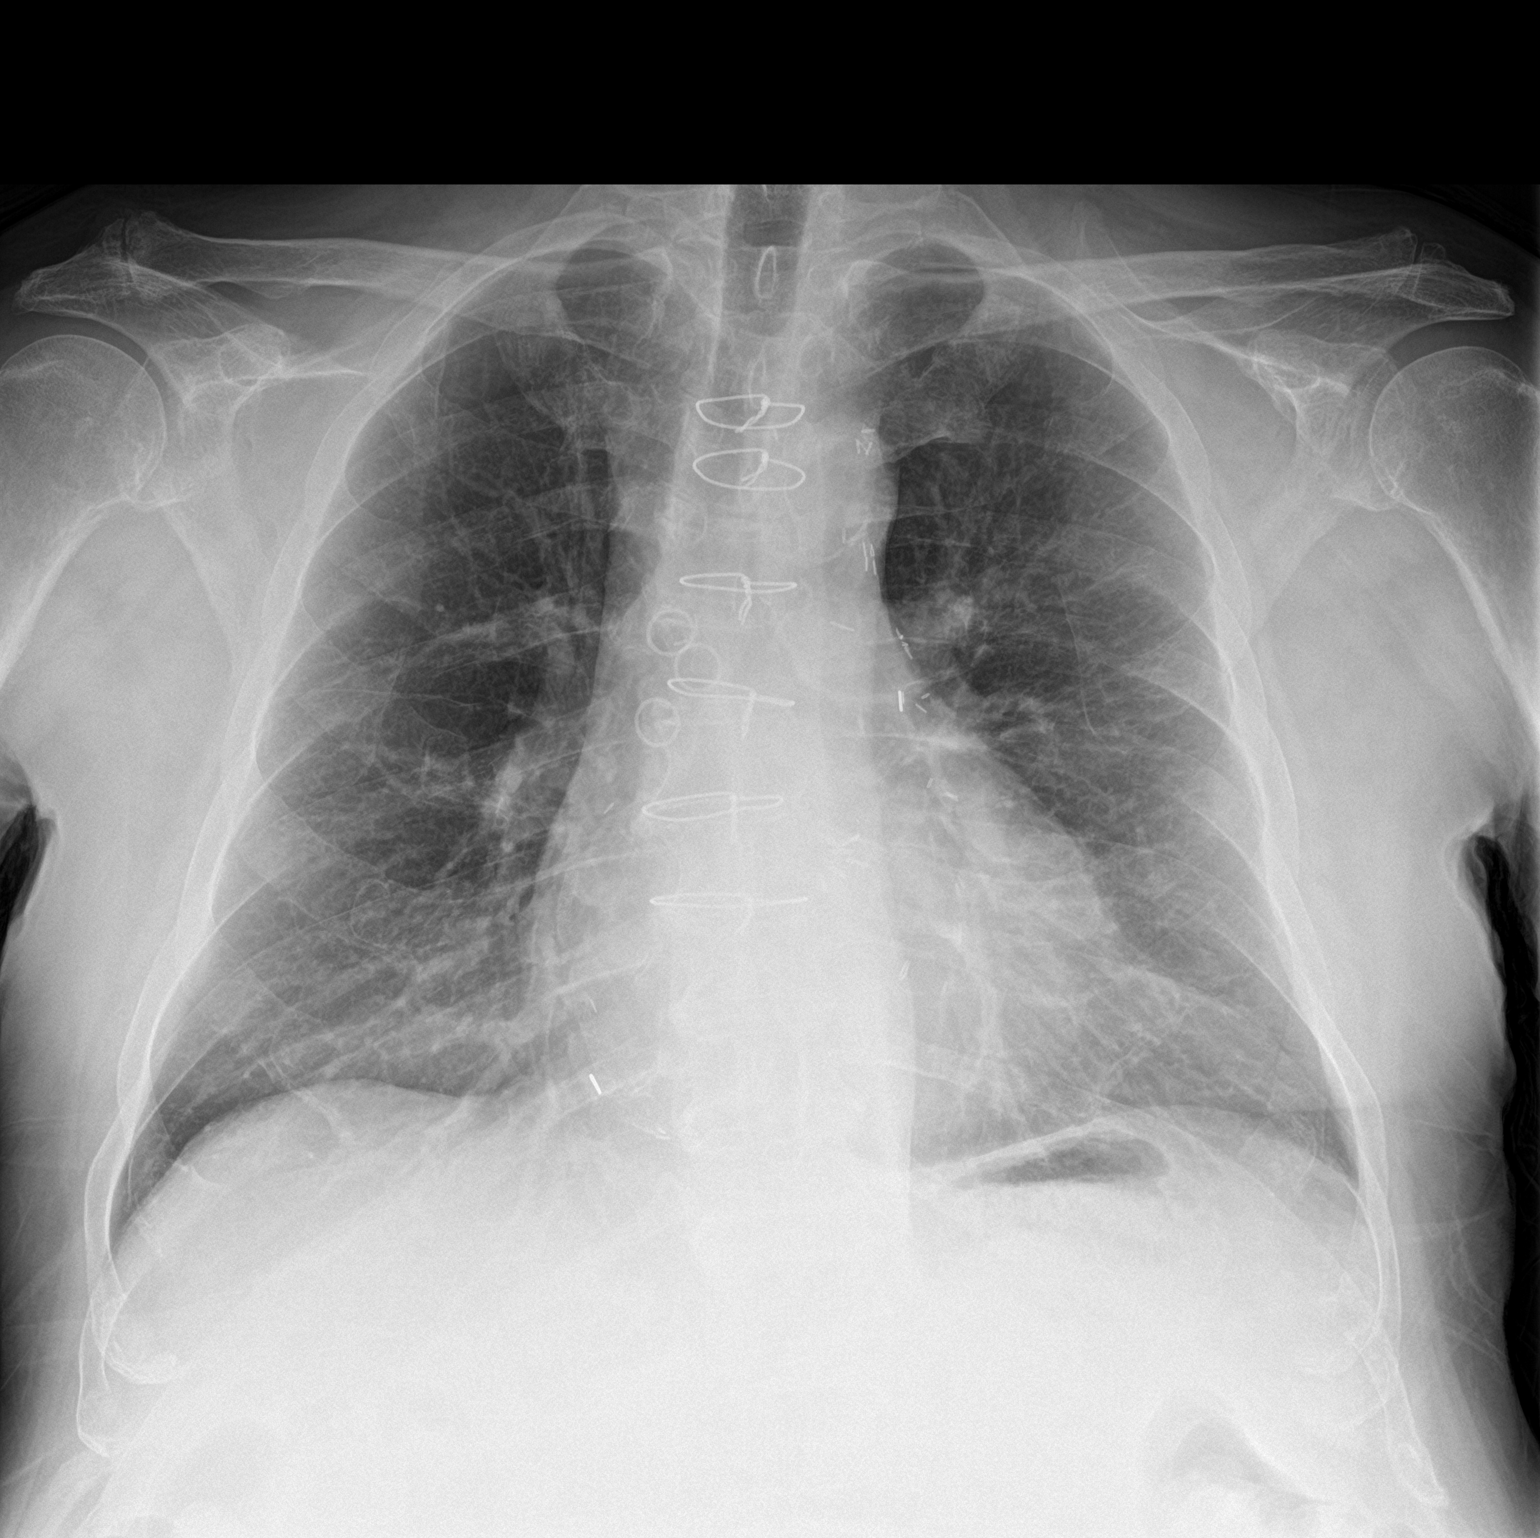

[chest lat]
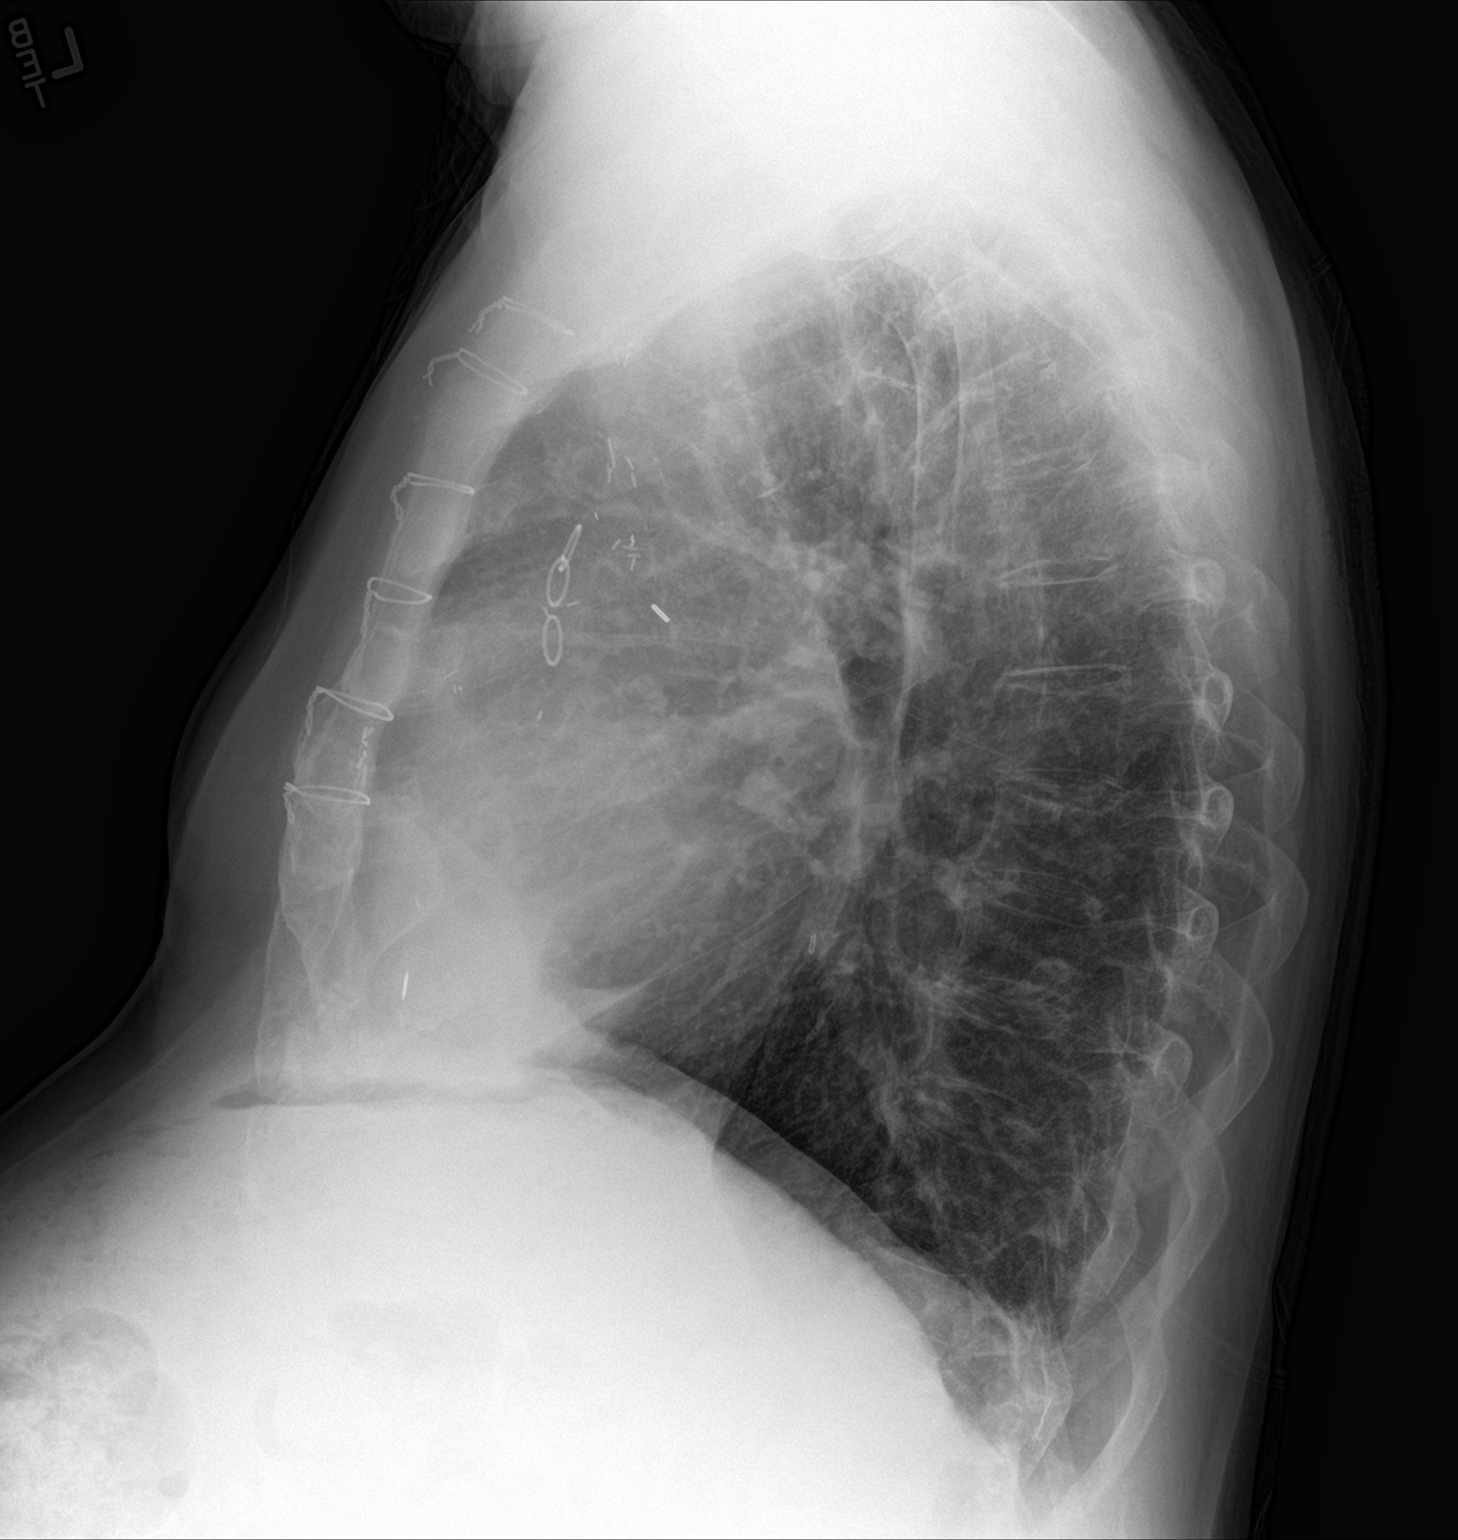

[2 of 2 positions shown; findings below may reference images not displayed]

FINDINGS: The heart is within normal limits for size. There is aortic
atherosclerosis at the arch. The patient is status post median
sternotomy and post CABG change. Emphysematous hyperinflation of the
lungs without pneumonic consolidation, effusion or pneumothorax.
Minimal atelectasis at the lung bases. Osteoarthritis of the AC
joints right worse than left.
IMPRESSION: No active cardiopulmonary disease. Status post CABG with aortic
atherosclerosis. Mild emphysematous hyperinflation of the lungs
bilaterally.

## 2018-03-18 ENCOUNTER — Other Ambulatory Visit: Payer: Self-pay | Admitting: Cardiology

## 2018-03-22 ENCOUNTER — Encounter (INDEPENDENT_AMBULATORY_CARE_PROVIDER_SITE_OTHER): Payer: PPO | Admitting: Ophthalmology

## 2018-03-22 DIAGNOSIS — H35371 Puckering of macula, right eye: Secondary | ICD-10-CM | POA: Diagnosis not present

## 2018-03-22 DIAGNOSIS — H59031 Cystoid macular edema following cataract surgery, right eye: Secondary | ICD-10-CM | POA: Diagnosis not present

## 2018-03-22 DIAGNOSIS — H35033 Hypertensive retinopathy, bilateral: Secondary | ICD-10-CM

## 2018-03-22 DIAGNOSIS — D3132 Benign neoplasm of left choroid: Secondary | ICD-10-CM | POA: Diagnosis not present

## 2018-03-22 DIAGNOSIS — H2512 Age-related nuclear cataract, left eye: Secondary | ICD-10-CM | POA: Diagnosis not present

## 2018-03-22 DIAGNOSIS — H353122 Nonexudative age-related macular degeneration, left eye, intermediate dry stage: Secondary | ICD-10-CM | POA: Diagnosis not present

## 2018-03-22 DIAGNOSIS — I10 Essential (primary) hypertension: Secondary | ICD-10-CM | POA: Diagnosis not present

## 2018-03-22 DIAGNOSIS — H43813 Vitreous degeneration, bilateral: Secondary | ICD-10-CM

## 2018-03-27 DIAGNOSIS — H903 Sensorineural hearing loss, bilateral: Secondary | ICD-10-CM | POA: Diagnosis not present

## 2018-04-04 DIAGNOSIS — G309 Alzheimer's disease, unspecified: Secondary | ICD-10-CM | POA: Diagnosis not present

## 2018-04-04 DIAGNOSIS — I10 Essential (primary) hypertension: Secondary | ICD-10-CM | POA: Diagnosis not present

## 2018-04-04 DIAGNOSIS — M1990 Unspecified osteoarthritis, unspecified site: Secondary | ICD-10-CM | POA: Diagnosis not present

## 2018-04-04 DIAGNOSIS — Z0001 Encounter for general adult medical examination with abnormal findings: Secondary | ICD-10-CM | POA: Diagnosis not present

## 2018-04-04 DIAGNOSIS — F0390 Unspecified dementia without behavioral disturbance: Secondary | ICD-10-CM | POA: Diagnosis not present

## 2018-04-04 DIAGNOSIS — F341 Dysthymic disorder: Secondary | ICD-10-CM | POA: Diagnosis not present

## 2018-04-04 DIAGNOSIS — Z1389 Encounter for screening for other disorder: Secondary | ICD-10-CM | POA: Diagnosis not present

## 2018-04-04 DIAGNOSIS — E039 Hypothyroidism, unspecified: Secondary | ICD-10-CM | POA: Diagnosis not present

## 2018-04-04 DIAGNOSIS — Z6829 Body mass index (BMI) 29.0-29.9, adult: Secondary | ICD-10-CM | POA: Diagnosis not present

## 2018-04-04 DIAGNOSIS — G894 Chronic pain syndrome: Secondary | ICD-10-CM | POA: Diagnosis not present

## 2018-04-04 DIAGNOSIS — E663 Overweight: Secondary | ICD-10-CM | POA: Diagnosis not present

## 2018-04-07 DIAGNOSIS — J9620 Acute and chronic respiratory failure, unspecified whether with hypoxia or hypercapnia: Secondary | ICD-10-CM | POA: Diagnosis not present

## 2018-04-07 DIAGNOSIS — H903 Sensorineural hearing loss, bilateral: Secondary | ICD-10-CM | POA: Diagnosis not present

## 2018-04-27 DIAGNOSIS — F419 Anxiety disorder, unspecified: Secondary | ICD-10-CM | POA: Diagnosis not present

## 2018-04-27 DIAGNOSIS — E039 Hypothyroidism, unspecified: Secondary | ICD-10-CM | POA: Diagnosis not present

## 2018-04-27 DIAGNOSIS — R0902 Hypoxemia: Secondary | ICD-10-CM | POA: Diagnosis not present

## 2018-04-27 DIAGNOSIS — E063 Autoimmune thyroiditis: Secondary | ICD-10-CM | POA: Diagnosis not present

## 2018-04-27 DIAGNOSIS — Z6829 Body mass index (BMI) 29.0-29.9, adult: Secondary | ICD-10-CM | POA: Diagnosis not present

## 2018-04-27 DIAGNOSIS — G894 Chronic pain syndrome: Secondary | ICD-10-CM | POA: Diagnosis not present

## 2018-04-27 DIAGNOSIS — I10 Essential (primary) hypertension: Secondary | ICD-10-CM | POA: Diagnosis not present

## 2018-04-27 DIAGNOSIS — E663 Overweight: Secondary | ICD-10-CM | POA: Diagnosis not present

## 2018-04-27 DIAGNOSIS — M1991 Primary osteoarthritis, unspecified site: Secondary | ICD-10-CM | POA: Diagnosis not present

## 2018-05-07 DIAGNOSIS — J9620 Acute and chronic respiratory failure, unspecified whether with hypoxia or hypercapnia: Secondary | ICD-10-CM | POA: Diagnosis not present

## 2018-05-24 ENCOUNTER — Encounter (INDEPENDENT_AMBULATORY_CARE_PROVIDER_SITE_OTHER): Payer: PPO | Admitting: Ophthalmology

## 2018-05-24 DIAGNOSIS — H35033 Hypertensive retinopathy, bilateral: Secondary | ICD-10-CM

## 2018-05-24 DIAGNOSIS — I10 Essential (primary) hypertension: Secondary | ICD-10-CM | POA: Diagnosis not present

## 2018-05-24 DIAGNOSIS — H59031 Cystoid macular edema following cataract surgery, right eye: Secondary | ICD-10-CM | POA: Diagnosis not present

## 2018-05-24 DIAGNOSIS — H35371 Puckering of macula, right eye: Secondary | ICD-10-CM | POA: Diagnosis not present

## 2018-05-24 DIAGNOSIS — H353122 Nonexudative age-related macular degeneration, left eye, intermediate dry stage: Secondary | ICD-10-CM

## 2018-05-24 DIAGNOSIS — H43813 Vitreous degeneration, bilateral: Secondary | ICD-10-CM

## 2018-05-24 DIAGNOSIS — H2512 Age-related nuclear cataract, left eye: Secondary | ICD-10-CM | POA: Diagnosis not present

## 2018-06-05 DIAGNOSIS — Z681 Body mass index (BMI) 19 or less, adult: Secondary | ICD-10-CM | POA: Diagnosis not present

## 2018-06-05 DIAGNOSIS — L239 Allergic contact dermatitis, unspecified cause: Secondary | ICD-10-CM | POA: Diagnosis not present

## 2018-06-07 DIAGNOSIS — J9620 Acute and chronic respiratory failure, unspecified whether with hypoxia or hypercapnia: Secondary | ICD-10-CM | POA: Diagnosis not present

## 2018-06-26 NOTE — Progress Notes (Signed)
Cardiology Office Note  Date: 06/27/2018   ID: LORENCE NAGENGAST, DOB 14-Jan-1934, MRN [id number]  PCP: [name], MD  Primary Cardiologist: Rozann Lesches, MD   Chief Complaint  Patient presents with  . Coronary Artery Disease    History of Present Illness: Dustin [name] is an 84 y.o. male last seen in December 2018.  He is here today with his wife for follow-up visit.  He reports relatively frequent angina symptoms or nitroglycerin use, states that he has been compliant with his baseline medical therapy.  He continues to have trouble with memory, had to be taken off Aricept due to bradycardia.  He is fairly sedentary at home.  Ischemic testing from January of last year is outlined below.  We went over his medications today with plan to increase Imdur to 60 mg in the morning and 30 mg in the evening.  Personally reviewed his ECG today which shows sinus arrhythmia with IVCD and repolarization abnormalities.  Past Medical History:  Diagnosis Date  . Alzheimer disease   . Anxiety   . Arthritis   . Atrophic gastritis    a. By EGD 02/2013.  . Carotid artery disease ([place])    a. mild-mod plaque, <50% stenosis bilat by duplex 2018.  [name] Coronary atherosclerosis of native coronary artery    a. Multivessel s/p CABG 1996. b. s/p DES x 2 SVG to PDA 8/12 with distal disease managed medically.  . DDD (degenerative disc disease)    Chronic back pain  . Enlarged prostate   . Essential hypertension, benign   . Hematuria   . Hypothyroidism   . LBBB (left bundle branch block)   . MI (myocardial infarction) ([place])   . Mixed hyperlipidemia   . OA (osteoarthritis)   . OSA (obstructive sleep apnea)   . Sinus bradycardia    a. Aricept and BB discontinued due to this.    Past Surgical History:  Procedure Laterality Date  . COLONOSCOPY  08/03/2004   Jenkins-numerous large diverticula in the descending, transverse, descending, and sigmoid colon. Otherwise normal exam.  . COLONOSCOPY   07/12/2012   RMR: External hemorrhoidal tag; multiple rectal and colonic polyps removed and/or treated as described above. Pancolonic diverticulosis. Bx-tubular adenomas, rectal hyperplastic polyp. next colonoscopy in 06/2015.  [name] COLONOSCOPY N/A 06/22/2016   Procedure: COLONOSCOPY;  Surgeon: Aviva Signs, MD;  [facility];  Service: Gastroenterology;  Laterality: N/A;  730  . CORONARY ANGIOPLASTY WITH STENT PLACEMENT    . CORONARY ARTERY BYPASS GRAFT  1996   LIMA to LAD, SVG to D2, SVG to PDA, SVG to OM1 and OM2  . ESOPHAGOGASTRODUODENOSCOPY N/A 03/16/2013   Procedure: ESOPHAGOGASTRODUODENOSCOPY (EGD);  Surgeon: Daneil Dolin, MD;  [facility];  Service: Endoscopy;  Laterality: N/A;  12:00-moved to [place] notified pt  . ESOPHAGOGASTRODUODENOSCOPY N/A 03/06/2013   Procedure: ESOPHAGOGASTRODUODENOSCOPY (EGD);  Surgeon: Daneil Dolin, MD;  [facility];  Service: Endoscopy;  Laterality: N/A;  . HERNIA REPAIR      Current Outpatient Medications  Medication Sig Dispense Refill  . ALPRAZolam (XANAX) 1 MG tablet Take 1 tablet (1 mg total) by mouth 2 (two) times daily. For anxiety/sleep (Patient taking differently: Take 1.5 mg by mouth at bedtime as needed. For anxiety/sleep) 30 tablet 0  . clopidogrel (PLAVIX) 75 MG tablet Take 1 tablet (75 mg total) by mouth daily.    [name] escitalopram (LEXAPRO) 10 MG tablet Take 10 mg by mouth daily.    [name]  furosemide (LASIX) 20 MG tablet Take 20 mg by mouth daily.     [name] gabapentin (NEURONTIN) 300 MG capsule Take 1 capsule (300 mg total) by mouth 3 (three) times daily. 90 capsule 0  . isosorbide mononitrate (IMDUR) 30 MG 24 hr tablet Take 60 mg ( 2 tablets)  Am, and 30 mg ( 1 tablet) in the afternoon 270 tablet 3  . levothyroxine (SYNTHROID, LEVOTHROID) 50 MCG tablet Take 50 mcg by mouth daily.    . memantine (NAMENDA) 10 MG tablet Take 1 tablet by mouth 2 (two) times daily.    . nitroGLYCERIN (NITROSTAT) 0.4 MG SL tablet PLACE  ONE TABLET UNDER TONGUE EVERY 5 MIN UP TO 3 DOSES AS NEEDED FORCHEST PAIN. 25 tablet 3  . oxycodone (ROXICODONE) 30 MG immediate release tablet Take 15 mg by mouth 3 (three) times daily.    . pravastatin (PRAVACHOL) 40 MG tablet TAKE (1) TABLET BY MOUTH AT BEDTIME. 15 tablet 0  . tamsulosin (FLOMAX) 0.4 MG CAPS capsule Take 1 capsule by mouth daily.     No current facility-administered medications for this visit.    Allergies:  Patient has no known allergies.   Social History: The patient  reports that he quit smoking about 23 years ago. His smoking use included cigarettes. He has a 80.00 pack-year smoking history. He has never used smokeless tobacco. He reports that he does not drink alcohol or use drugs.   ROS:  Please see the history of present illness. Otherwise, complete review of systems is positive for hearing and memory loss.  All other systems are reviewed and negative.   Physical Exam: VS:  BP 138/70 (BP Location: Right Arm)   Pulse (!) 53   Ht 5\' 10"  (1.778 m)   Wt 204 lb (92.5 kg)   SpO2 93%   BMI 29.27 kg/m , BMI Body mass index is 29.27 kg/m.  Wt Readings from Last 3 Encounters:  06/27/18 204 lb (92.5 kg)  11/29/17 209 lb (94.8 kg)  05/27/17 201 lb (91.2 kg)    General: Elderly male, appears comfortable at rest. HEENT: Conjunctiva and lids normal, oropharynx clear. Neck: Supple, no elevated JVP or carotid bruits, no thyromegaly. Lungs: Clear to auscultation, nonlabored breathing at rest. Cardiac: Regular rate and rhythm, no S3, soft systolic murmur. Abdomen: Soft, nontender, bowel sounds present. Extremities: No pitting edema, distal pulses 2+. Skin: Warm and dry. Musculoskeletal: No kyphosis. Neuropsychiatric: Alert and oriented x3, affect grossly appropriate.  ECG: I personally reviewed the tracing from 01/28/2017 which showed sinus bradycardia with IVCD.  Recent Labwork:    Component Value Date/Time   CHOL 127 06/29/2014 0308   TRIG 143 06/29/2014 0308    HDL 40 06/29/2014 0308   CHOLHDL 3.2 06/29/2014 0308   VLDL 29 06/29/2014 0308   LDLCALC 58 06/29/2014 0308  02/12/2017: Magnesium 2.0; TSH 3.893 02/14/2017: BUN 15; Creatinine, Ser 0.79; Potassium 3.8; Sodium 140 02/15/2017: Hemoglobin 10.7; Platelets 201   Other Studies Reviewed Today:  Echocardiogram 03/08/2017: Study Conclusions  - Left ventricle: The cavity size was normal. Wall thickness was increased in a pattern of mild LVH. Systolic function was normal. The estimated ejection fraction was 55%. Doppler parameters are consistent with abnormal left ventricular relaxation (grade 1 diastolic dysfunction). - Regional wall motion abnormality: Mild hypokinesis of the mid inferior myocardium. - Aortic valve: Moderately calcified annulus. Trileaflet. There was no stenosis. - Mitral valve: There was mild regurgitation. - Left atrium: The atrium was mildly to moderately dilated. - Right ventricle:  Systolic function was mildly reduced. - Right atrium: The atrium was mildly dilated. - Tricuspid valve: There was mild regurgitation.  Carotid artery disease 03/08/2017: IMPRESSION: Mild-to-moderate bilateral carotid atherosclerosis. No hemodynamically significant ICA stenosis. Degree of narrowing less than 50% bilaterally.  Patent antegrade vertebral flow bilaterally  Lexiscan Myoview 12/28/2016:  Inferior defect that shows partial improvement consistent with scar/soft tissue attenuation and periinfarct ischemia  LVEF 46%  Intermediate risk study  Assessment and Plan:  1.  Multivessel CAD status post CABG and subsequent DES intervention to the SVG to PDA in 2012.  Lexiscan Myoview from last year showed combination of scar and peri-infarct ischemia in the inferior wall, we have been managing him medically.  Imdur will be increased to 60 mg in the morning and 30 mg in the afternoon, otherwise continue present regimen.  Not on beta-blocker due to symptomatic  bradycardia.  2.  Essential hypertension, continue with current medications.  Potentially consider Norvasc if additional antianginal control needed.  3.  Mixed hyperlipidemia, continues on Pravachol with follow-up per the PCP.  4.  Asymptomatic, nonobstructive carotid artery disease.  Continue antiplatelet regimen and statin.  Current medicines were reviewed with the patient today.   Orders Placed This Encounter  Procedures  . EKG 12-Lead    Disposition: Follow-up in 6 months.  Signed, Satira Sark, MD, [facility] 06/27/2018 12:27 PM    [place] at [facility] [address], [place],  [address] Phone: [phone]; Fax: [phone]

## 2018-06-27 ENCOUNTER — Ambulatory Visit: Payer: PPO | Admitting: Cardiology

## 2018-06-27 ENCOUNTER — Encounter: Payer: Self-pay | Admitting: Cardiology

## 2018-06-27 ENCOUNTER — Other Ambulatory Visit: Payer: Self-pay | Admitting: Cardiology

## 2018-06-27 VITALS — BP 138/70 | HR 53 | Ht 70.0 in | Wt 204.0 lb

## 2018-06-27 DIAGNOSIS — I25119 Atherosclerotic heart disease of native coronary artery with unspecified angina pectoris: Secondary | ICD-10-CM | POA: Diagnosis not present

## 2018-06-27 DIAGNOSIS — I6523 Occlusion and stenosis of bilateral carotid arteries: Secondary | ICD-10-CM | POA: Diagnosis not present

## 2018-06-27 DIAGNOSIS — E782 Mixed hyperlipidemia: Secondary | ICD-10-CM | POA: Diagnosis not present

## 2018-06-27 DIAGNOSIS — I10 Essential (primary) hypertension: Secondary | ICD-10-CM

## 2018-06-27 MED ORDER — NITROGLYCERIN 0.4 MG SL SUBL: SUBLINGUAL_TABLET | SUBLINGUAL | 3 refills | Status: DC

## 2018-06-27 MED ORDER — ISOSORBIDE MONONITRATE ER 30 MG PO TB24: ORAL_TABLET | ORAL | 3 refills | Status: DC

## 2018-06-27 NOTE — Patient Instructions (Addendum)
Your physician wants you to follow-up in:  with Dr.McDowell You will receive a reminder letter in the mail two months in advance. If you don't receive a letter, please call our office to schedule the follow-up appointment.  INCREASE Imdur to 60 mg in the am, and 30 mg in the afternoon   If you need a refill on your cardiac medications before your next appointment, please call your pharmacy.  I refilled your nitroglycerine today.  No lab work or tests ordered today.       Thank you for choosing [facility] !

## 2018-06-28 ENCOUNTER — Other Ambulatory Visit (HOSPITAL_COMMUNITY): Payer: Self-pay | Admitting: Internal Medicine

## 2018-06-28 ENCOUNTER — Ambulatory Visit (HOSPITAL_COMMUNITY)
Admission: RE | Admit: 2018-06-28 | Discharge: 2018-06-28 | Disposition: A | Discharge status: Home / Self Care | Payer: PPO | Source: Ambulatory Visit | Attending: Internal Medicine | Admitting: Internal Medicine

## 2018-06-28 DIAGNOSIS — R0781 Pleurodynia: Secondary | ICD-10-CM

## 2018-06-28 DIAGNOSIS — E663 Overweight: Secondary | ICD-10-CM | POA: Diagnosis not present

## 2018-06-28 DIAGNOSIS — S299XXA Unspecified injury of thorax, initial encounter: Secondary | ICD-10-CM | POA: Diagnosis not present

## 2018-06-28 DIAGNOSIS — S0993XA Unspecified injury of face, initial encounter: Secondary | ICD-10-CM | POA: Diagnosis not present

## 2018-06-28 DIAGNOSIS — R58 Hemorrhage, not elsewhere classified: Secondary | ICD-10-CM | POA: Diagnosis not present

## 2018-06-28 DIAGNOSIS — T1490XA Injury, unspecified, initial encounter: Secondary | ICD-10-CM

## 2018-06-28 DIAGNOSIS — M1991 Primary osteoarthritis, unspecified site: Secondary | ICD-10-CM | POA: Diagnosis not present

## 2018-06-28 DIAGNOSIS — G894 Chronic pain syndrome: Secondary | ICD-10-CM | POA: Diagnosis not present

## 2018-06-28 DIAGNOSIS — S1093XA Contusion of unspecified part of neck, initial encounter: Secondary | ICD-10-CM | POA: Diagnosis not present

## 2018-06-28 DIAGNOSIS — F419 Anxiety disorder, unspecified: Secondary | ICD-10-CM | POA: Diagnosis not present

## 2018-06-28 DIAGNOSIS — S0083XA Contusion of other part of head, initial encounter: Secondary | ICD-10-CM | POA: Insufficient documentation

## 2018-06-28 DIAGNOSIS — Y92481 Parking lot as the place of occurrence of the external cause: Secondary | ICD-10-CM | POA: Diagnosis not present

## 2018-06-28 DIAGNOSIS — S0990XA Unspecified injury of head, initial encounter: Secondary | ICD-10-CM | POA: Diagnosis not present

## 2018-06-28 DIAGNOSIS — W1839XA Other fall on same level, initial encounter: Secondary | ICD-10-CM | POA: Diagnosis not present

## 2018-06-28 DIAGNOSIS — I10 Essential (primary) hypertension: Secondary | ICD-10-CM | POA: Diagnosis not present

## 2018-06-28 DIAGNOSIS — Z6829 Body mass index (BMI) 29.0-29.9, adult: Secondary | ICD-10-CM | POA: Diagnosis not present

## 2018-07-07 DIAGNOSIS — J9620 Acute and chronic respiratory failure, unspecified whether with hypoxia or hypercapnia: Secondary | ICD-10-CM | POA: Diagnosis not present

## 2018-08-07 DIAGNOSIS — J9620 Acute and chronic respiratory failure, unspecified whether with hypoxia or hypercapnia: Secondary | ICD-10-CM | POA: Diagnosis not present

## 2018-08-15 DIAGNOSIS — R0981 Nasal congestion: Secondary | ICD-10-CM | POA: Diagnosis not present

## 2018-08-15 DIAGNOSIS — E663 Overweight: Secondary | ICD-10-CM | POA: Diagnosis not present

## 2018-08-15 DIAGNOSIS — J42 Unspecified chronic bronchitis: Secondary | ICD-10-CM | POA: Diagnosis not present

## 2018-08-15 DIAGNOSIS — Z6829 Body mass index (BMI) 29.0-29.9, adult: Secondary | ICD-10-CM | POA: Diagnosis not present

## 2018-08-15 DIAGNOSIS — R05 Cough: Secondary | ICD-10-CM | POA: Diagnosis not present

## 2018-08-15 DIAGNOSIS — R062 Wheezing: Secondary | ICD-10-CM | POA: Diagnosis not present

## 2018-08-30 DIAGNOSIS — L820 Inflamed seborrheic keratosis: Secondary | ICD-10-CM | POA: Diagnosis not present

## 2018-08-30 DIAGNOSIS — L821 Other seborrheic keratosis: Secondary | ICD-10-CM | POA: Diagnosis not present

## 2018-08-30 DIAGNOSIS — D225 Melanocytic nevi of trunk: Secondary | ICD-10-CM | POA: Diagnosis not present

## 2018-09-07 DIAGNOSIS — E063 Autoimmune thyroiditis: Secondary | ICD-10-CM | POA: Diagnosis not present

## 2018-09-07 DIAGNOSIS — R0601 Orthopnea: Secondary | ICD-10-CM | POA: Diagnosis not present

## 2018-09-07 DIAGNOSIS — R319 Hematuria, unspecified: Secondary | ICD-10-CM | POA: Diagnosis not present

## 2018-09-07 DIAGNOSIS — J9620 Acute and chronic respiratory failure, unspecified whether with hypoxia or hypercapnia: Secondary | ICD-10-CM | POA: Diagnosis not present

## 2018-09-07 DIAGNOSIS — I10 Essential (primary) hypertension: Secondary | ICD-10-CM | POA: Diagnosis not present

## 2018-09-07 DIAGNOSIS — Z6829 Body mass index (BMI) 29.0-29.9, adult: Secondary | ICD-10-CM | POA: Diagnosis not present

## 2018-09-07 DIAGNOSIS — I509 Heart failure, unspecified: Secondary | ICD-10-CM | POA: Diagnosis not present

## 2018-09-07 DIAGNOSIS — G894 Chronic pain syndrome: Secondary | ICD-10-CM | POA: Diagnosis not present

## 2018-09-07 DIAGNOSIS — R0600 Dyspnea, unspecified: Secondary | ICD-10-CM | POA: Diagnosis not present

## 2018-09-11 ENCOUNTER — Other Ambulatory Visit (HOSPITAL_COMMUNITY): Payer: Self-pay | Admitting: Internal Medicine

## 2018-09-11 ENCOUNTER — Ambulatory Visit (HOSPITAL_COMMUNITY)
Admission: RE | Admit: 2018-09-11 | Discharge: 2018-09-11 | Disposition: A | Discharge status: Home / Self Care | Payer: PPO | Source: Ambulatory Visit | Attending: Internal Medicine | Admitting: Internal Medicine

## 2018-09-11 DIAGNOSIS — J984 Other disorders of lung: Secondary | ICD-10-CM | POA: Insufficient documentation

## 2018-09-11 DIAGNOSIS — R0600 Dyspnea, unspecified: Secondary | ICD-10-CM

## 2018-09-19 ENCOUNTER — Emergency Department (HOSPITAL_COMMUNITY)
Admission: EM | Admit: 2018-09-19 | Discharge: 2018-09-19 | Disposition: A | Discharge status: Home / Self Care | Payer: PPO | Attending: Emergency Medicine | Admitting: Emergency Medicine

## 2018-09-19 NOTE — ED Triage Notes (Signed)
Called pt several times for triage but family says he's in the restroom.

## 2018-09-19 NOTE — ED Triage Notes (Signed)
Family told registration pt feels better and no longer wants to be seen in ED.

## 2018-09-25 ENCOUNTER — Other Ambulatory Visit: Payer: Self-pay | Admitting: *Deleted

## 2018-09-25 ENCOUNTER — Encounter (INDEPENDENT_AMBULATORY_CARE_PROVIDER_SITE_OTHER): Payer: PPO | Admitting: Ophthalmology

## 2018-09-25 ENCOUNTER — Other Ambulatory Visit: Payer: Self-pay | Admitting: Cardiology
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
## 2018-09-27 ENCOUNTER — Other Ambulatory Visit: Payer: Self-pay

## 2018-09-27 NOTE — Patient Outreach (Signed)
[facility]) Care Management  09/27/2018  LYFE MONGER 1934/01/07 [id number]  84 year old male referred to [place] Management.  [place] services requested for polypharmacy medication review side effect from oxycodone and medication assistance with Linzess   PMHx includes, but not limited to, hypertension, coronary artery disease, hypothyroidism and hyperlipidemia.  Successful outreach to Mr. Lange's wife.  HIPAA identifiers verified.   Plan: Home visit scheduled for 10/8 at 11 am.  Joetta Manners, [place] [phone]

## 2018-10-03 ENCOUNTER — Other Ambulatory Visit: Payer: Self-pay

## 2018-10-03 ENCOUNTER — Ambulatory Visit: Payer: Self-pay

## 2018-10-03 ENCOUNTER — Telehealth: Payer: Self-pay | Admitting: Cardiology

## 2018-10-03 MED ORDER — ISOSORBIDE MONONITRATE ER 30 MG PO TB24: ORAL_TABLET | ORAL | 3 refills | Status: DC

## 2018-10-03 NOTE — Patient Outreach (Signed)
[facility]) Care Management  [place]   10/03/2018  Dustin [name] Apr 06, 1934 [id number]  84 year old male referred to [facility].  [place] services requested for polypharmacy medication review side effect from oxycodone and medication assistance with Linzess   PMHx includes, but not limited to, hypertension, coronary artery disease, hypothyroidism and hyperlipidemia.  Successful home visit with Dustin [name], his wife and son.   HIPAA identifiers  verified.  Subjective: Dustin [name] states that they are somewhat overwhelmed with all of his medications.  They are currently using a pill box, but welcome the idea of compliance pill packaging.  They state that Dustin [name] has mild dementia and parkinson's disease.  Dustin [name] reports extremely dry mouth which he attributes to his medications.  He also reports issues with urinary retention and is on tamsulosin and dutasteride.  Patient reports taking oxycodone 30 mg IR every 4 hours hours for pain in his feet and legs.  He also experiences constipation from this narcotic.   He states he was prescribed Linzess, but he cannot afford it now that he is in the coverage gap.    Objective:  0.79 mg/dL  Current Medications: Current Outpatient Medications  Medication Sig Dispense Refill  . albuterol (PROVENTIL) 4 MG tablet Take 4 mg by mouth 3 (three) times daily as needed for wheezing.    [name] ALPRAZolam (XANAX) 1 MG tablet Take 1 tablet (1 mg total) by mouth 2 (two) times daily. For anxiety/sleep (Patient taking differently: Take 1.5 mg by mouth 2 (two) times daily as needed. For anxiety/sleep) 30 tablet 0  . clopidogrel (PLAVIX) 75 MG tablet Take 1 tablet (75 mg total) by mouth daily.    [name] docusate sodium (COLACE) 250 MG capsule Take 250 mg by mouth daily.    [name] dutasteride (AVODART) 0.5 MG capsule Take 0.5 mg by mouth daily.    [name] escitalopram (LEXAPRO) 10 MG tablet Take 10 mg by mouth daily.    . furosemide (LASIX) 20  MG tablet Take 20 mg by mouth 2 (two) times daily.     [name] gabapentin (NEURONTIN) 300 MG capsule Take 1 capsule (300 mg total) by mouth 3 (three) times daily. 90 capsule 0  . levothyroxine (SYNTHROID, LEVOTHROID) 50 MCG tablet Take 50 mcg by mouth daily.    . memantine (NAMENDA) 10 MG tablet Take 1 tablet by mouth 2 (two) times daily.    . nitroGLYCERIN (NITROSTAT) 0.4 MG SL tablet PLACE ONE TABLET UNDER TONGUE EVERY 5 MIN UP TO 3 DOSES AS NEEDED FORCHEST PAIN. 25 tablet 6  . oxycodone (ROXICODONE) 30 MG immediate release tablet Take 30 mg by mouth every 4 (four) hours as needed for pain. Take 1 tablet by mouth every 4 to 6 hours as needed for pain.  Max 8 /24 hours.    . pravastatin (PRAVACHOL) 40 MG tablet TAKE (1) TABLET BY MOUTH AT BEDTIME. 15 tablet 0  . Sennosides 25 MG TABS Take 1 tablet by mouth every 3 (three) days. To help with opioid constipation    . tamsulosin (FLOMAX) 0.4 MG CAPS capsule Take 1 capsule by mouth daily.    . isosorbide mononitrate (IMDUR) 30 MG 24 hr tablet Take 60 mg ( 2 tablets)  Am, and 30 mg ( 1 tablet) in the afternoon 270 tablet 3  . pilocarpine (SALAGEN) 5 MG tablet Take 5 mg by mouth 2 (two) times daily.    . potassium chloride SA (K-DUR,KLOR-CON) 20 MEQ tablet Take  20 mEq by mouth daily. When taking lasix     No current facility-administered medications for this visit.     Functional Status: No flowsheet data found.  Fall/Depression Screening: Fall Risk  05/02/2017  Falls in the past year? No   PHQ 2/9 Scores 09/25/2018  PHQ - 2 Score 0    Drugs sorted by system:  Neurologic/Psychologic: alprazolam, escitalopram, memantine  Cardiovascular:albuterol MDI, clopidogrel, furosemide, isosorbide mononitrate,  Nitroglycerin, potassium chloride, pravastatin   Gastrointestinal: docusate, sennosides  Endocrine: levothyroxine   Pain: gabapentin, oxycodone IR  Genitourinary: dutasteride, tamsulosin  Miscellaneous: pilocapine  Medication Review  Findings; Isosorbide mononitrate 30 mg ER- Wrong directions,  Clarified with Dr. Myles Gip office that dose is 60 mg in the morning and 30 mg in the afternoon.  New prescription called to [facility].  Gabapentin 600 mg - Directions are for 1 capsule three times daily, but he only takes it twice daily.  Requested Dr. Nolon Rod office to call in a  new script to [facility].  Dutasteride and Tamsulosin- both in home, not sure if he is supposed to be on both.  Dr. Gerarda Fraction is out of town.  His staff requested that I call to clarify next week when he returns.   Potassium Chloride- In home, but son reports that patient has not been taking.  Encouraged patient  to take it as prescribed since he is on a diuretic.  Discussed various OTC products to help with dry mouth, such as saliva substitute.  He is currently using Biotene mouth spray.  He is currently prescribed pilocarpine for xerostomia.     Medication Assistance: Per financial discussion, Dustin [name] will not qualify for Linzess patient assistance from [place] due to his income.  Plan: Outreach to Dr. Nolon Rod office next week to determine if Dustin [name] should be on both tamsulosin and dutaseriide.   Set up compliance pill packaging with [facility].  Joetta Manners, PharmD Clinical Pharmacist [place] [phone]

## 2018-10-03 NOTE — Telephone Encounter (Signed)
Please give Joetta Manners -pharmacists w/ [facility] a call concerning the pt's isosorbide mononitrate (IMDUR) 30 MG 24 hr tablet [[id number]].  Anderson Malta is needing some clarification on the directions, they're different on his bottle than what's on his chart.  [phone]

## 2018-10-03 NOTE — Telephone Encounter (Signed)
Returned call. Sent in new RX for Imdur.

## 2018-10-07 DIAGNOSIS — J9620 Acute and chronic respiratory failure, unspecified whether with hypoxia or hypercapnia: Secondary | ICD-10-CM | POA: Diagnosis not present

## 2018-10-09 ENCOUNTER — Ambulatory Visit: Payer: Self-pay

## 2018-10-09 ENCOUNTER — Other Ambulatory Visit: Payer: Self-pay

## 2018-10-09 DIAGNOSIS — Z23 Encounter for immunization: Secondary | ICD-10-CM | POA: Diagnosis not present

## 2018-10-09 NOTE — Patient Outreach (Addendum)
[facility]) Care Management  10/09/2018  Dustin [name] Jun 28, 1934 [id number]   Spoke with Dr. Nolon [name] office and clarified the following medications:  Gabapentin 300 mg - Okay to change directions to 1 capsule by mouth twice daily as the patient has been taking at home.   Dutasteride 0.5 mg daily and Tamsulosin 0.4 mg daily.  Dustin [name] is prescribed both medications.     Linzess 290 mg, 1 capsule daily. Not taking due to cost.   Potassium Chloride- Patient should be on 20 mEq daily.   Medications Reviewed Today    Reviewed by Dustin [name], [facility] (Pharmacist) on 10/03/18 at 1358  Med List Status: <None>  Medication Order Taking? Sig Documenting Provider Last Dose Status Informant  albuterol (PROVENTIL) 4 MG tablet [id number] Yes Take 4 mg by mouth 3 (three) times daily as needed for wheezing. [provider] Taking Active Self  ALPRAZolam Duanne Moron) 1 MG tablet [id number] Yes Take 1 tablet (1 mg total) by mouth 2 (two) times daily. For anxiety/sleep  Patient taking differently:  Take 1.5 mg by mouth 2 (two) times daily as needed. For anxiety/sleep   Dustin Lose, MD Taking Active Self  clopidogrel (PLAVIX) 75 MG tablet [id number] Yes Take 1 tablet (75 mg total) by mouth daily. Dustin Poisson, MD Taking Active Self  docusate sodium (COLACE) 250 MG capsule [id number] Yes Take 250 mg by mouth daily. [provider] Taking Active   dutasteride (AVODART) 0.5 MG capsule [id number] Yes Take 0.5 mg by mouth daily. [provider] Taking Active   escitalopram (LEXAPRO) 10 MG tablet [id number] Yes Take 10 mg by mouth daily. [provider] Taking Active Self  furosemide (LASIX) 20 MG tablet [id number] Yes Take 20 mg by mouth 2 (two) times daily.  [provider] Taking Active Self           Med Note Dustin [name], Dustin [name] Dec 23, 2016 10:36 PM) Received from: External Pharmacy  gabapentin (NEURONTIN) 300 MG capsule [id number] Yes  Take 1 capsule (300 mg total) by mouth 3 (three) times daily. Dustin Lose, MD Taking Active Self  isosorbide mononitrate (IMDUR) 30 MG 24 hr tablet [id number]  Take 60 mg ( 2 tablets)  Am, and 30 mg ( 1 tablet) in the afternoon Dustin Sark, MD  Active   levothyroxine (SYNTHROID, LEVOTHROID) 50 MCG tablet [id number] Yes Take 50 mcg by mouth daily. [provider] Taking Active Self           Med Note Dustin [name] Dec 23, 2016 10:24 PM) Received from: External Pharmacy  memantine (NAMENDA) 10 MG tablet [id number] Yes Take 1 tablet by mouth 2 (two) times daily. [provider] Taking Active Self           Med Note Dustin [name], Dustin [name]   Sat Apr 03, 2016  2:10 PM)    nitroGLYCERIN (NITROSTAT) 0.4 MG SL tablet [id number] Yes PLACE ONE TABLET UNDER TONGUE EVERY 5 MIN UP TO 3 DOSES AS NEEDED FORCHEST PAIN. Dustin Sark, MD Taking Active   oxycodone (ROXICODONE) 30 MG immediate release tablet [id number] Yes Take 30 mg by mouth every 4 (four) hours as needed for pain. Take 1 tablet by mouth every 4 to 6 hours as needed for pain.  Max 8 /24 hours. [provider] Taking Active Self           Med Note Dustin [name], Dustin [name]   Thu Dec 23, 2016 10:24 PM) Received from: External Pharmacy  pilocarpine (SALAGEN) 5 MG tablet [id number] No Take 5 mg by mouth 2 (two) times daily. [provider] Not Taking Active Self  potassium chloride SA ([name]-DUR,KLOR-CON) 20 MEQ tablet [id number] No Take 20 mEq by mouth daily. When taking lasix [provider] Not Taking Active   pravastatin (PRAVACHOL) 40 MG tablet [id number] Yes TAKE (1) TABLET BY MOUTH AT BEDTIME. Dustin Colonel, NP Taking Active Self  Sennosides 25 MG TABS [id number] Yes Take 1 tablet by mouth every 3 (three) days. To help with opioid constipation [provider] Taking Active   tamsulosin (FLOMAX) 0.4 MG CAPS capsule [id number] Yes Take 1 capsule by mouth daily. [provider] Taking Active  Self           Med Note Dustin [name], Dustin [name]   Sat Apr 03, 2016  2:10 PM)           Outreach to [place] at [facility].  Reviewed Dustin [name]'s current medication list.  Pharmacy will use his home medications to fill his compliance pill packs, since they originally filled them.    Outreach to Dustin [name].  Spoke with son, Dustin [name] who was present during my home visit last week and who is responsible for filling Dustin [name]'s pill box.  HIPAA identifiers verified.  Son states that they will take his pill bottles to [facility].   Requested that the family, ask to speak to the pharmacist to review how to use the compliance pill pack.  I previously reviewed pill packs during my home visit and the family verbalized understanding.  Clarified income limit for [employer] patient assistance for Linzess.  Must make < 400% of poverty limit to be eligible.   Plan: Follow up with the Dustin [name] family at the end of this week to see if they have further questions after the pill packs arrive.  Review income limits with Dustin [name] to see if he is below the income limit for [employer] assistance.  Dustin [name], PharmD Clinical Pharmacist [place] [phone]

## 2018-10-22 ENCOUNTER — Emergency Department (HOSPITAL_COMMUNITY)
Admission: EM | Admit: 2018-10-22 | Discharge: 2018-10-23 | Disposition: A | Discharge status: Home / Self Care | Payer: PPO | Attending: Emergency Medicine | Admitting: Emergency Medicine

## 2018-10-22 ENCOUNTER — Encounter (HOSPITAL_COMMUNITY): Payer: Self-pay | Admitting: *Deleted

## 2018-10-22 ENCOUNTER — Other Ambulatory Visit: Payer: Self-pay

## 2018-10-22 ENCOUNTER — Emergency Department (HOSPITAL_COMMUNITY): Payer: PPO

## 2018-10-22 DIAGNOSIS — R05 Cough: Secondary | ICD-10-CM | POA: Diagnosis not present

## 2018-10-22 DIAGNOSIS — G309 Alzheimer's disease, unspecified: Secondary | ICD-10-CM | POA: Insufficient documentation

## 2018-10-22 DIAGNOSIS — Z87891 Personal history of nicotine dependence: Secondary | ICD-10-CM | POA: Diagnosis not present

## 2018-10-22 DIAGNOSIS — R0689 Other abnormalities of breathing: Secondary | ICD-10-CM | POA: Diagnosis not present

## 2018-10-22 DIAGNOSIS — I110 Hypertensive heart disease with heart failure: Secondary | ICD-10-CM | POA: Diagnosis not present

## 2018-10-22 DIAGNOSIS — Z79899 Other long term (current) drug therapy: Secondary | ICD-10-CM | POA: Insufficient documentation

## 2018-10-22 DIAGNOSIS — J069 Acute upper respiratory infection, unspecified: Secondary | ICD-10-CM | POA: Insufficient documentation

## 2018-10-22 DIAGNOSIS — R410 Disorientation, unspecified: Secondary | ICD-10-CM | POA: Diagnosis not present

## 2018-10-22 DIAGNOSIS — I252 Old myocardial infarction: Secondary | ICD-10-CM | POA: Insufficient documentation

## 2018-10-22 DIAGNOSIS — Z951 Presence of aortocoronary bypass graft: Secondary | ICD-10-CM | POA: Diagnosis not present

## 2018-10-22 DIAGNOSIS — Z955 Presence of coronary angioplasty implant and graft: Secondary | ICD-10-CM | POA: Insufficient documentation

## 2018-10-22 DIAGNOSIS — R0902 Hypoxemia: Secondary | ICD-10-CM | POA: Diagnosis not present

## 2018-10-22 DIAGNOSIS — I5032 Chronic diastolic (congestive) heart failure: Secondary | ICD-10-CM | POA: Diagnosis not present

## 2018-10-22 DIAGNOSIS — I2510 Atherosclerotic heart disease of native coronary artery without angina pectoris: Secondary | ICD-10-CM | POA: Insufficient documentation

## 2018-10-22 DIAGNOSIS — R0602 Shortness of breath: Secondary | ICD-10-CM | POA: Diagnosis not present

## 2018-10-22 DIAGNOSIS — I499 Cardiac arrhythmia, unspecified: Secondary | ICD-10-CM | POA: Diagnosis not present

## 2018-10-22 MED ORDER — IPRATROPIUM-ALBUTEROL 0.5-2.5 (3) MG/3ML IN SOLN
3.0000 mL | Freq: Once | RESPIRATORY_TRACT | Status: AC
  Administered 2018-10-22: 3 mL via RESPIRATORY_TRACT
  Filled 2018-10-22: qty 3

## 2018-10-22 MED ORDER — ALBUTEROL SULFATE (2.5 MG/3ML) 0.083% IN NEBU: 2.5000 mg | INHALATION_SOLUTION | Freq: Four times a day (QID) | RESPIRATORY_TRACT | 3 refills | Status: DC | PRN

## 2018-10-22 MED ORDER — PREDNISONE 50 MG PO TABS: ORAL_TABLET | ORAL | 0 refills | Status: DC

## 2018-10-22 MED ORDER — AZITHROMYCIN 250 MG PO TABS: 250.0000 mg | ORAL_TABLET | Freq: Every day | ORAL | 0 refills | Status: DC

## 2018-10-22 NOTE — Discharge Instructions (Signed)
X-ray showed no obvious pneumonia.  Prescription for antibiotic, prednisone, albuterol solution for nebulizer machine.  Follow-up with your primary care doctor.

## 2018-10-22 NOTE — ED Triage Notes (Signed)
Pt brought in by [facility] for c/o sob and non-productive cough; pt took albuterol pta with no relief

## 2018-10-23 ENCOUNTER — Other Ambulatory Visit: Payer: Self-pay

## 2018-10-23 NOTE — ED Provider Notes (Signed)
[facility] EMERGENCY DEPARTMENT Provider Note   CSN: [id number] Arrival date & time: 10/22/18  1905     History   Chief Complaint Chief Complaint  Patient presents with  . Shortness of Breath    HPI Dustin [name] is a 84 y.o. male.  Level 5 caveat for memory loss.  Patient presents with cough and questionable dyspnea for several days.  He has used his albuterol inhaler with minimal relief.  He is concerned about pneumonia.  No fever, sweats, chills, rusty sputum, chest pain.  He lives at home with his wife.     Past Medical History:  Diagnosis Date  . Alzheimer disease ([place])   . Anxiety   . Arthritis   . Atrophic gastritis    a. By EGD 02/2013.  . Carotid artery disease ([place])    a. mild-mod plaque, <50% stenosis bilat by duplex 2018.  [name] Coronary atherosclerosis of native coronary artery    a. Multivessel s/p CABG 1996. b. s/p DES x 2 SVG to PDA 8/12 with distal disease managed medically.  . DDD (degenerative disc disease)    Chronic back pain  . Enlarged prostate   . Essential hypertension, benign   . Hematuria   . Hypothyroidism   . LBBB (left bundle branch block)   . MI (myocardial infarction) ([place])   . Mixed hyperlipidemia   . OA (osteoarthritis)   . OSA (obstructive sleep apnea)   . Sinus bradycardia    a. Aricept and BB discontinued due to this.    Patient Active Problem List   Diagnosis Date Noted  . Pressure injury of skin 02/15/2017  . Chronic diastolic CHF (congestive heart failure) ([place]) 12/24/2016  . Sinus bradycardia 12/23/2016  . Foot pain, bilateral 12/23/2016  . Hypokalemia 04/05/2016  . Acute respiratory failure ([place]) 04/03/2016  . Acute encephalopathy 04/03/2016  . Hypothyroidism 04/03/2016  . Aspiration pneumonia ([place]) 04/03/2016  . Dementia ([place]) 04/03/2016  . Acute respiratory failure with hypoxia ([place]) 04/03/2016  . Memory loss 04/20/2015  . Hypoxia 01/27/2015  . CAP (community acquired pneumonia) 01/27/2015  . Fever 08/30/2014   . Healthcare associated bacterial pneumonia 08/30/2014  . Toxic Metabolic encephalopathy 17/91/5056  . Sepsis ([place]) 08/30/2014  . Neck pain on right side 08/29/2014  . Neck pain 08/29/2014  . Left-sided weakness 06/28/2014  . Chest pain 06/27/2014  . Tubular adenoma of colon 03/05/2013  . Anorexia 03/05/2013  . Loss of weight 03/05/2013  . Chronic constipation 07/04/2012  . Bronchitis 08/16/2011  . Hyperlipidemia 10/01/2009  . OSA (obstructive sleep apnea) 10/01/2009  . [place] DISEASE 10/01/2009  . HYPERTENSION, BENIGN 10/01/2009  . Coronary artery disease 10/01/2009    Past Surgical History:  Procedure Laterality Date  . COLONOSCOPY  08/03/2004   Jenkins-numerous large diverticula in the descending, transverse, descending, and sigmoid colon. Otherwise normal exam.  . COLONOSCOPY  07/12/2012   RMR: External hemorrhoidal tag; multiple rectal and colonic polyps removed and/or treated as described above. Pancolonic diverticulosis. Bx-tubular adenomas, rectal hyperplastic polyp. next colonoscopy in 06/2015.  [name] COLONOSCOPY N/A 06/22/2016   Procedure: COLONOSCOPY;  Surgeon: Aviva Signs, MD;  Location: [facility] ENDO SUITE;  Service: Gastroenterology;  Laterality: N/A;  730  . CORONARY ANGIOPLASTY WITH STENT PLACEMENT    . CORONARY ARTERY BYPASS GRAFT  1996   LIMA to LAD, SVG to D2, SVG to PDA, SVG to OM1 and OM2  . ESOPHAGOGASTRODUODENOSCOPY N/A 03/16/2013   Procedure: ESOPHAGOGASTRODUODENOSCOPY (EGD);  Surgeon: Daneil Dolin, MD;  Location: [facility] ENDO  SUITE;  Service: Endoscopy;  Laterality: N/A;  12:00-moved to [place] notified pt  . ESOPHAGOGASTRODUODENOSCOPY N/A 03/06/2013   Procedure: ESOPHAGOGASTRODUODENOSCOPY (EGD);  Surgeon: Daneil Dolin, MD;  Location: [facility] ENDO SUITE;  Service: Endoscopy;  Laterality: N/A;  . HERNIA REPAIR          Home Medications    Prior to Admission medications   Medication Sig Start Date End Date Taking? Authorizing Provider  albuterol (PROVENTIL)  (2.5 MG/3ML) 0.083% nebulizer solution Take 3 mLs (2.5 mg total) by nebulization every 6 (six) hours as needed for wheezing or shortness of breath. Patient will also need the nebulizer machine with this prescription. 10/22/18   Nat Christen, MD  ALPRAZolam Duanne Moron) 1 MG tablet Take 1 tablet (1 mg total) by mouth 2 (two) times daily. For anxiety/sleep Patient taking differently: Take 1.5 mg by mouth 2 (two) times daily as needed. For anxiety/sleep 12/25/16   Thurnell Lose, MD  azithromycin (ZITHROMAX) 250 MG tablet Take 1 tablet (250 mg total) by mouth daily. Take first 2 tablets together, then 1 every day until finished. 10/22/18   Nat Christen, MD  clopidogrel (PLAVIX) 75 MG tablet Take 1 tablet (75 mg total) by mouth daily. 01/29/15   Hosie Poisson, MD  docusate sodium (COLACE) 250 MG capsule Take 250 mg by mouth daily.    [provider]  dutasteride (AVODART) 0.5 MG capsule Take 0.5 mg by mouth daily.    [provider]  escitalopram (LEXAPRO) 10 MG tablet Take 10 mg by mouth daily.    [provider]  furosemide (LASIX) 20 MG tablet Take 20 mg by mouth 2 (two) times daily.  12/03/16   [provider]  gabapentin (NEURONTIN) 300 MG capsule Take 300 mg by mouth 2 (two) times daily.    [provider]  isosorbide mononitrate (IMDUR) 30 MG 24 hr tablet Take 60 mg ( 2 tablets)  Am, and 30 mg ( 1 tablet) in the afternoon 10/03/18   Satira Sark, MD  levothyroxine (SYNTHROID, LEVOTHROID) 50 MCG tablet Take 50 mcg by mouth daily. 10/04/16   [provider]  memantine (NAMENDA) 10 MG tablet Take 1 tablet by mouth 2 (two) times daily. 03/05/16   [provider]  nitroGLYCERIN (NITROSTAT) 0.4 MG SL tablet PLACE ONE TABLET UNDER TONGUE EVERY 5 MIN UP TO 3 DOSES AS NEEDED FORCHEST PAIN. 06/27/18   Satira Sark, MD  oxycodone (ROXICODONE) 30 MG immediate release tablet Take 30 mg by mouth every 4 (four) hours as needed for pain. Take 1 tablet  by mouth every 4 to 6 hours as needed for pain.  Max 8 /24 hours. 12/09/16   [provider]  pilocarpine (SALAGEN) 5 MG tablet Take 5 mg by mouth 2 (two) times daily.    [provider]  potassium chloride SA (K-DUR,KLOR-CON) 20 MEQ tablet Take 20 mEq by mouth daily. When taking lasix    [provider]  pravastatin (PRAVACHOL) 40 MG tablet TAKE (1) TABLET BY MOUTH AT BEDTIME. 06/21/16   Lendon Colonel, NP  predniSONE (DELTASONE) 50 MG tablet 3 tabs for 3 days; 2 tabs for 3 days; 1 tab for 3 days 10/22/18   Nat Christen, MD  Sennosides 25 MG TABS Take 1 tablet by mouth every 3 (three) days. To help with opioid constipation    [provider]  tamsulosin (FLOMAX) 0.4 MG CAPS capsule Take 1 capsule by mouth daily. 02/23/16   [provider]  Family History Family History  Problem Relation Age of Onset  . Heart disease Unknown   . Heart attack Mother   . Colon cancer Neg Hx     Social History Social History   Tobacco Use  . Smoking status: Former Smoker    Packs/day: 2.00    Years: 40.00    Pack years: 80.00    Types: Cigarettes    Last attempt to quit: 12/27/1994    Years since quitting: 23.8  . Smokeless tobacco: Never Used  Substance Use Topics  . Alcohol use: No    Alcohol/week: 0.0 standard drinks  . Drug use: No     Allergies   Patient has no known allergies.   Review of Systems Review of Systems  Unable to perform ROS: Acuity of condition     Physical Exam Updated Vital Signs BP 138/62   Pulse (!) 58   Temp 98.5 F (36.9 C) (Oral)   Resp 13   Ht 5\' 9"  (1.753 m)   Wt 99.8 kg   SpO2 98%   BMI 32.49 kg/m   Physical Exam  Constitutional: He is oriented to person, place, and time. He appears well-developed and well-nourished.  No acute distress  HENT:  Head: Normocephalic and atraumatic.  Eyes: Conjunctivae are normal.  Neck: Neck supple.  Cardiovascular: Normal rate and regular rhythm.  Pulmonary/Chest:  Effort normal and breath sounds normal.  Abdominal: Soft. Bowel sounds are normal.  Musculoskeletal: Normal range of motion.  Neurological: He is alert and oriented to person, place, and time.  Skin: Skin is warm and dry.  Psychiatric:  Flat affect  Nursing note and vitals reviewed.    ED Treatments / Results  Labs (all labs ordered are listed, but only abnormal results are displayed) Labs Reviewed - No data to display  EKG EKG Interpretation  Date/Time:  Sunday October 22 2018 19:09:14 EDT Ventricular Rate:  67 PR Interval:    QRS Duration: 136 QT Interval:  427 QTC Calculation: 451 R Axis:   62 Text Interpretation:  Sinus rhythm Nonspecific intraventricular conduction delay Borderline repolarization abnormality Confirmed by Nat Christen [phone]) on 10/22/2018 8:21:11 PM   Radiology Dg Chest 2 View  Result Date: 10/22/2018 CLINICAL DATA:  Shortness of breath and nonproductive cough. EXAM: CHEST - 2 VIEW COMPARISON:  09/11/2018 and 01/26/2018 FINDINGS: Lungs are adequately inflated without focal airspace consolidation or effusion. Mild stable cardiomegaly. Remainder of the exam is unchanged. IMPRESSION: No acute cardiopulmonary disease. Mild stable cardiomegaly. Electronically Signed   By: Marin Olp M.D.   On: 10/22/2018 20:45    Procedures Procedures (including critical care time)  Medications Ordered in ED Medications  ipratropium-albuterol (DUONEB) 0.5-2.5 (3) MG/3ML nebulizer solution 3 mL (3 mLs Nebulization Given 10/22/18 1956)     Initial Impression / Assessment and Plan / ED Course  I have reviewed the triage vital signs and the nursing notes.  Pertinent labs & imaging results that were available during my care of the patient were reviewed by me and considered in my medical decision making (see chart for details).     Patient presents with URI like symptoms.  He is not in respiratory distress.  Chest x-ray negative for pneumonia.  He was given a nebulizer  treatment in the ED which helped.  Discharge medications prednisone, albuterol nebulizer solution and machinery, Zithromax.  Final Clinical Impressions(s) / ED Diagnoses   Final diagnoses:  Upper respiratory tract infection, unspecified type    ED Discharge Orders  Ordered    predniSONE (DELTASONE) 50 MG tablet     10/22/18 2321    azithromycin (ZITHROMAX) 250 MG tablet  Daily     10/22/18 2321    albuterol (PROVENTIL) (2.5 MG/3ML) 0.083% nebulizer solution  Every 6 hours PRN     10/22/18 2321           Nat Christen, MD 10/23/18 2146

## 2018-10-24 ENCOUNTER — Other Ambulatory Visit: Payer: Self-pay

## 2018-10-24 NOTE — Patient Outreach (Signed)
[facility]) Care Management  [place]   10/24/2018  KEYONTA BARRADAS January 04, 1934 [id number]  Reason for referral: Medication Assistance for Linzess and Oxycodone Referral source: [facility]   [place] has recently completed a post discharge medication review, home visit and facilitated change to compliance pill packaging for Mr. Wynonia Lawman.  Referral today was for medication assistance for oxycodone and Linzess.  Patient was given a prescription in the past for Linzess, but states that never picked it up due to cost.  Objective: No Known Allergies  Medications Reviewed Today    Reviewed by Lyndal Rainbow, CPhT (Pharmacy Technician) on 10/22/18 at 2215  Med List Status: Follow Up Phone Call  Medication Order Taking? Sig Documenting Provider Last Dose Status Informant  albuterol (PROVENTIL) 4 MG tablet [id number] Yes Take 4 mg by mouth 3 (three) times daily as needed for wheezing. [provider] 10/22/2018 Unknown time Active Self  ALPRAZolam (XANAX) 1 MG tablet [id number]  Take 1 tablet (1 mg total) by mouth 2 (two) times daily. For anxiety/sleep  Patient taking differently:  Take 1.5 mg by mouth 2 (two) times daily as needed. For anxiety/sleep   Thurnell Lose, MD  Active Self  clopidogrel (PLAVIX) 75 MG tablet [id number]  Take 1 tablet (75 mg total) by mouth daily. Hosie Poisson, MD  Active Self  docusate sodium (COLACE) 250 MG capsule [id number]  Take 250 mg by mouth daily. [provider]  Active   dutasteride (AVODART) 0.5 MG capsule [id number]  Take 0.5 mg by mouth daily. [provider]  Active   escitalopram (LEXAPRO) 10 MG tablet [id number]  Take 10 mg by mouth daily. [provider]  Active Self  furosemide (LASIX) 20 MG tablet [id number]  Take 20 mg by mouth 2 (two) times daily.  [provider]  Active Self           Med Note Trinidad Curet, [place] Dec 23, 2016 10:36 PM) Received from: External Pharmacy  gabapentin  (NEURONTIN) 300 MG capsule [id number]  Take 300 mg by mouth 2 (two) times daily. [provider]  Active   isosorbide mononitrate (IMDUR) 30 MG 24 hr tablet [id number]  Take 60 mg ( 2 tablets)  Am, and 30 mg ( 1 tablet) in the afternoon Satira Sark, MD  Active   levothyroxine (SYNTHROID, LEVOTHROID) 50 MCG tablet [id number]  Take 50 mcg by mouth daily. [provider]  Active Self           Med Note Carter Kitten Dec 23, 2016 10:24 PM) Received from: External Pharmacy  memantine (NAMENDA) 10 MG tablet [id number]  Take 1 tablet by mouth 2 (two) times daily. [provider]  Active Self           Med Note Ahmed Prima, AMBER C   Sat Apr 03, 2016  2:10 PM)    nitroGLYCERIN (NITROSTAT) 0.4 MG SL tablet [id number]  PLACE ONE TABLET UNDER TONGUE EVERY 5 MIN UP TO 3 DOSES AS NEEDED FORCHEST PAIN. Satira Sark, MD  Active   oxycodone (ROXICODONE) 30 MG immediate release tablet [id number]  Take 30 mg by mouth every 4 (four) hours as needed for pain. Take 1 tablet by mouth every 4 to 6 hours as needed for pain.  Max 8 /24 hours. [provider]  Active Self           Med Note Trinidad Curet, [place] Citizen Dec 23, 2016 10:24 PM) Received from: External Pharmacy  pilocarpine (SALAGEN) 5 MG tablet [id number]  Take 5 mg by mouth 2 (two) times daily. [provider]  Active Self  potassium chloride SA (K-DUR,KLOR-CON) 20 MEQ tablet [id number]  Take 20 mEq by mouth daily. When taking lasix [provider]  Active   pravastatin (PRAVACHOL) 40 MG tablet [id number]  TAKE (1) TABLET BY MOUTH AT BEDTIME. Lendon Colonel, NP  Active Self  Sennosides 25 MG TABS [id number]  Take 1 tablet by mouth every 3 (three) days. To help with opioid constipation [provider]  Active   tamsulosin (FLOMAX) 0.4 MG CAPS capsule [id number]  Take 1 capsule by mouth daily. [provider]  Active Self           Med Note Ahmed Prima, AMBER C   Sat Apr 03, 2016  2:10 PM)             Medication Assistance Findings:  Extra Help:   []  Already receiving Full Extra Help  []  Already receiving Partial Extra Help  []  Eligible based on reported income and assets  [x]  Not Eligible based on reported income and assets  Patient Assistance Programs: 1) Oxycodone- No patient assistance available for CII medications.  2) Linzess- The manufacturer, [employer] will not provide patient assistance if a medication is covered under a prescription drug plan.  Linzess is a Tier 3 medication under [employer], so he is not eligible for [employer] assistance.  Patient and his wife report that compliance pill packaging is working well and they are very happy with it.  They state that they have no further mediation questions or concerns at this time. Informed them that I will close his [place] case.   They are aware that they can call me in the future if medication issues arise.  Plan: Route case closure letter to Dr. Gerarda Fraction.  Joetta Manners, [place] [phone]

## 2018-11-03 DIAGNOSIS — R42 Dizziness and giddiness: Secondary | ICD-10-CM | POA: Diagnosis not present

## 2018-11-03 DIAGNOSIS — H6523 Chronic serous otitis media, bilateral: Secondary | ICD-10-CM | POA: Diagnosis not present

## 2018-11-03 DIAGNOSIS — N401 Enlarged prostate with lower urinary tract symptoms: Secondary | ICD-10-CM | POA: Diagnosis not present

## 2018-11-03 DIAGNOSIS — S0101XA Laceration without foreign body of scalp, initial encounter: Secondary | ICD-10-CM | POA: Diagnosis not present

## 2018-11-03 DIAGNOSIS — N410 Acute prostatitis: Secondary | ICD-10-CM | POA: Diagnosis not present

## 2018-11-03 DIAGNOSIS — Z6830 Body mass index (BMI) 30.0-30.9, adult: Secondary | ICD-10-CM | POA: Diagnosis not present

## 2018-11-03 DIAGNOSIS — M48061 Spinal stenosis, lumbar region without neurogenic claudication: Secondary | ICD-10-CM | POA: Diagnosis not present

## 2018-11-03 DIAGNOSIS — I10 Essential (primary) hypertension: Secondary | ICD-10-CM | POA: Diagnosis not present

## 2018-11-07 ENCOUNTER — Emergency Department (HOSPITAL_COMMUNITY): Payer: PPO

## 2018-11-07 ENCOUNTER — Observation Stay (HOSPITAL_COMMUNITY)
Admission: EM | Admit: 2018-11-07 | Discharge: 2018-11-08 | Disposition: A | Discharge status: Home / Self Care | Payer: PPO | Attending: Internal Medicine | Admitting: Internal Medicine

## 2018-11-07 ENCOUNTER — Other Ambulatory Visit: Payer: Self-pay

## 2018-11-07 ENCOUNTER — Encounter (INDEPENDENT_AMBULATORY_CARE_PROVIDER_SITE_OTHER): Payer: PPO | Admitting: Ophthalmology

## 2018-11-07 ENCOUNTER — Encounter (HOSPITAL_COMMUNITY): Payer: Self-pay | Admitting: Emergency Medicine

## 2018-11-07 DIAGNOSIS — D72829 Elevated white blood cell count, unspecified: Secondary | ICD-10-CM | POA: Diagnosis not present

## 2018-11-07 DIAGNOSIS — R2681 Unsteadiness on feet: Secondary | ICD-10-CM

## 2018-11-07 DIAGNOSIS — I110 Hypertensive heart disease with heart failure: Secondary | ICD-10-CM | POA: Insufficient documentation

## 2018-11-07 DIAGNOSIS — I200 Unstable angina: Secondary | ICD-10-CM | POA: Diagnosis not present

## 2018-11-07 DIAGNOSIS — G308 Other Alzheimer's disease: Secondary | ICD-10-CM | POA: Diagnosis not present

## 2018-11-07 DIAGNOSIS — F0280 Dementia in other diseases classified elsewhere without behavioral disturbance: Secondary | ICD-10-CM

## 2018-11-07 DIAGNOSIS — I2510 Atherosclerotic heart disease of native coronary artery without angina pectoris: Secondary | ICD-10-CM | POA: Insufficient documentation

## 2018-11-07 DIAGNOSIS — I5032 Chronic diastolic (congestive) heart failure: Secondary | ICD-10-CM | POA: Insufficient documentation

## 2018-11-07 DIAGNOSIS — E785 Hyperlipidemia, unspecified: Secondary | ICD-10-CM | POA: Diagnosis not present

## 2018-11-07 DIAGNOSIS — N401 Enlarged prostate with lower urinary tract symptoms: Secondary | ICD-10-CM

## 2018-11-07 DIAGNOSIS — S0990XA Unspecified injury of head, initial encounter: Secondary | ICD-10-CM | POA: Diagnosis not present

## 2018-11-07 DIAGNOSIS — Z955 Presence of coronary angioplasty implant and graft: Secondary | ICD-10-CM | POA: Insufficient documentation

## 2018-11-07 DIAGNOSIS — Z951 Presence of aortocoronary bypass graft: Secondary | ICD-10-CM | POA: Diagnosis not present

## 2018-11-07 DIAGNOSIS — N400 Enlarged prostate without lower urinary tract symptoms: Secondary | ICD-10-CM | POA: Insufficient documentation

## 2018-11-07 DIAGNOSIS — W19XXXA Unspecified fall, initial encounter: Secondary | ICD-10-CM

## 2018-11-07 DIAGNOSIS — R42 Dizziness and giddiness: Secondary | ICD-10-CM | POA: Diagnosis not present

## 2018-11-07 DIAGNOSIS — F329 Major depressive disorder, single episode, unspecified: Secondary | ICD-10-CM | POA: Diagnosis not present

## 2018-11-07 DIAGNOSIS — E039 Hypothyroidism, unspecified: Secondary | ICD-10-CM | POA: Diagnosis present

## 2018-11-07 DIAGNOSIS — R079 Chest pain, unspecified: Secondary | ICD-10-CM

## 2018-11-07 DIAGNOSIS — J9620 Acute and chronic respiratory failure, unspecified whether with hypoxia or hypercapnia: Secondary | ICD-10-CM | POA: Diagnosis not present

## 2018-11-07 DIAGNOSIS — I10 Essential (primary) hypertension: Secondary | ICD-10-CM | POA: Diagnosis present

## 2018-11-07 DIAGNOSIS — G8929 Other chronic pain: Secondary | ICD-10-CM

## 2018-11-07 DIAGNOSIS — N138 Other obstructive and reflux uropathy: Secondary | ICD-10-CM

## 2018-11-07 DIAGNOSIS — R0789 Other chest pain: Secondary | ICD-10-CM | POA: Diagnosis not present

## 2018-11-07 DIAGNOSIS — G309 Alzheimer's disease, unspecified: Secondary | ICD-10-CM

## 2018-11-07 DIAGNOSIS — F32A Depression, unspecified: Secondary | ICD-10-CM

## 2018-11-07 LAB — BASIC METABOLIC PANEL
Anion gap: 12 (ref 5–15)
BUN: 17 mg/dL (ref 8–23)
CO2: 24 mmol/L (ref 22–32)
Calcium: 9.3 mg/dL (ref 8.9–10.3)
Chloride: 99 mmol/L (ref 98–111)
Creatinine, Ser: 1.06 mg/dL (ref 0.61–1.24)
GFR calc Af Amer: >60 mL/min (ref >60)
GFR calc non Af Amer: >60 mL/min (ref >60)
GLUCOSE: 280 mg/dL — AB (ref 70–99)
POTASSIUM: 3.7 mmol/L (ref 3.5–5.1)
Sodium: 135 mmol/L (ref 135–145)

## 2018-11-07 LAB — CBC
HEMATOCRIT: 40.3 % (ref 39.0–52.0)
HEMOGLOBIN: 13 g/dL (ref 13.0–17.0)
MCH: 31.6 pg (ref 26.0–34.0)
MCHC: 32.3 g/dL (ref 30.0–36.0)
MCV: 97.8 fL (ref 80.0–100.0)
Platelets: 232 10*3/uL (ref 150–400)
RBC: 4.12 MIL/uL — AB (ref 4.22–5.81)
RDW: 13 % (ref 11.5–15.5)
WBC: 14.9 10*3/uL — ABNORMAL HIGH (ref 4.0–10.5)
nRBC: 0 % (ref 0.0–0.2)

## 2018-11-07 LAB — I-STAT TROPONIN, ED: Troponin i, poc: 0.01 ng/mL (ref 0.00–0.08)

## 2018-11-07 MED ORDER — CLOPIDOGREL BISULFATE 75 MG PO TABS
75.0000 mg | ORAL_TABLET | Freq: Every day | ORAL | Status: DC
  Administered 2018-11-08: 75 mg via ORAL
  Filled 2018-11-07: qty 1

## 2018-11-07 MED ORDER — DOCUSATE SODIUM 100 MG PO CAPS
200.0000 mg | ORAL_CAPSULE | Freq: Every day | ORAL | Status: DC
  Administered 2018-11-08: 200 mg via ORAL
  Filled 2018-11-07: qty 2

## 2018-11-07 MED ORDER — ISOSORBIDE MONONITRATE ER 30 MG PO TB24
60.0000 mg | ORAL_TABLET | Freq: Every day | ORAL | Status: DC
  Administered 2018-11-08: 60 mg via ORAL
  Filled 2018-11-07: qty 2

## 2018-11-07 MED ORDER — NITROGLYCERIN 0.4 MG SL SUBL: 0.4000 mg | SUBLINGUAL_TABLET | SUBLINGUAL | Status: DC | PRN

## 2018-11-07 MED ORDER — ALBUTEROL SULFATE (2.5 MG/3ML) 0.083% IN NEBU: 2.5000 mg | INHALATION_SOLUTION | Freq: Four times a day (QID) | RESPIRATORY_TRACT | Status: DC | PRN

## 2018-11-07 MED ORDER — LEVOTHYROXINE SODIUM 50 MCG PO TABS
50.0000 ug | ORAL_TABLET | Freq: Every day | ORAL | Status: DC
  Administered 2018-11-08: 50 ug via ORAL
  Filled 2018-11-07: qty 1

## 2018-11-07 MED ORDER — DUTASTERIDE 0.5 MG PO CAPS
0.5000 mg | ORAL_CAPSULE | Freq: Every day | ORAL | Status: DC
  Filled 2018-11-07 (×3): qty 1

## 2018-11-07 MED ORDER — PILOCARPINE HCL 5 MG PO TABS
5.0000 mg | ORAL_TABLET | Freq: Two times a day (BID) | ORAL | Status: DC
  Filled 2018-11-07 (×5): qty 1

## 2018-11-07 MED ORDER — PRAVASTATIN SODIUM 40 MG PO TABS: 40.0000 mg | ORAL_TABLET | Freq: Every evening | ORAL | Status: DC

## 2018-11-07 MED ORDER — GABAPENTIN 300 MG PO CAPS
300.0000 mg | ORAL_CAPSULE | Freq: Two times a day (BID) | ORAL | Status: DC
  Administered 2018-11-08: 300 mg via ORAL
  Filled 2018-11-07: qty 1

## 2018-11-07 MED ORDER — ACETAMINOPHEN 325 MG PO TABS
650.0000 mg | ORAL_TABLET | ORAL | Status: DC | PRN
  Administered 2018-11-08: 650 mg via ORAL

## 2018-11-07 MED ORDER — ALPRAZOLAM 0.5 MG PO TABS
1.0000 mg | ORAL_TABLET | Freq: Once | ORAL | Status: AC
  Administered 2018-11-07: 1 mg via ORAL
  Filled 2018-11-07: qty 2

## 2018-11-07 MED ORDER — ASPIRIN 81 MG PO CHEW: 324.0000 mg | CHEWABLE_TABLET | Freq: Once | ORAL | Status: DC

## 2018-11-07 MED ORDER — MEMANTINE HCL 10 MG PO TABS
10.0000 mg | ORAL_TABLET | Freq: Two times a day (BID) | ORAL | Status: DC
  Administered 2018-11-08: 10 mg via ORAL
  Filled 2018-11-07: qty 1

## 2018-11-07 MED ORDER — TAMSULOSIN HCL 0.4 MG PO CAPS
0.4000 mg | ORAL_CAPSULE | Freq: Every day | ORAL | Status: DC
  Administered 2018-11-08: 0.4 mg via ORAL
  Filled 2018-11-07: qty 1

## 2018-11-07 MED ORDER — ESCITALOPRAM OXALATE 10 MG PO TABS
10.0000 mg | ORAL_TABLET | Freq: Every day | ORAL | Status: DC
  Administered 2018-11-08: 10 mg via ORAL
  Filled 2018-11-07: qty 1

## 2018-11-07 MED ORDER — BUSPIRONE HCL 5 MG PO TABS
10.0000 mg | ORAL_TABLET | Freq: Two times a day (BID) | ORAL | Status: DC
  Administered 2018-11-08: 10 mg via ORAL
  Filled 2018-11-07: qty 2

## 2018-11-07 MED ORDER — ONDANSETRON HCL 4 MG/2ML IJ SOLN: 4.0000 mg | Freq: Four times a day (QID) | INTRAMUSCULAR | Status: DC | PRN

## 2018-11-07 NOTE — ED Triage Notes (Signed)
Pt c/o pain to throat, mid chest and left arm today, with sob and dizziness.  No pain or symptoms at present. Non diaphoretic. Bypass hx. A/o.

## 2018-11-07 NOTE — H&P (Addendum)
History and Physical    Dustin [name]:[id number] DOB: 04/06/1934 DOA: 11/07/2018  PCP: [name], MD Patient coming from: Home  Chief Complaint: Chest pain  HPI: Dustin [name] is a 84 y.o. male with medical history significant of Alzheimer's disease, anxiety, coronary artery disease status post CABG and PCI, hypertension, hyperlipidemia, hypothyroidism, left bundle branch block presenting to the hospital for evaluation of chest pain.  Patient was very somnolent after receiving Xanax in the ED and it was difficult to obtain a history from him.  He reported having substernal pressure-like chest pain at rest which lasts about 10 minutes and resolves after taking nitroglycerin.  He continued to have several episodes of chest pain today despite taking 8-9 doses of nitroglycerin.  His chest pain is associated with nausea, diaphoresis, and dizziness.  He is currently chest pain-free.  Wife at bedside states that patient fell from a ladder 2 weeks ago and since then has been losing balance.  He has also been complaining of headaches but wife does state that patient has chronic headaches. He did not seek medical attention at the time of his fall.   ED Course: Hemodynamically stable.  White count 14.9.  I-STAT troponin negative.  EKG showing T wave inversions in inferior and lateral leads which are new compared to prior EKG from October 23, 2018.  Chest x-ray showing mild bibasilar opacities which are favored to represent atelectasis; infiltrates less likely; no overt edema.  Review of Systems: As per HPI otherwise 10 point review of systems negative.  Past Medical History:  Diagnosis Date  . Alzheimer disease ([place])   . Anxiety   . Arthritis   . Atrophic gastritis    a. By EGD 02/2013.  . Carotid artery disease ([place])    a. mild-mod plaque, <50% stenosis bilat by duplex 2018.  [name] Coronary atherosclerosis of native coronary artery    a. Multivessel s/p CABG 1996. b. s/p DES x 2 SVG to PDA  8/12 with distal disease managed medically.  . DDD (degenerative disc disease)    Chronic back pain  . Enlarged prostate   . Essential hypertension, benign   . Hematuria   . Hypothyroidism   . LBBB (left bundle branch block)   . MI (myocardial infarction) ([place])   . Mixed hyperlipidemia   . OA (osteoarthritis)   . OSA (obstructive sleep apnea)   . Sinus bradycardia    a. Aricept and BB discontinued due to this.    Past Surgical History:  Procedure Laterality Date  . COLONOSCOPY  08/03/2004   [place]-numerous large diverticula in the descending, transverse, descending, and sigmoid colon. Otherwise normal exam.  . COLONOSCOPY  07/12/2012   RMR: External hemorrhoidal tag; multiple rectal and colonic polyps removed and/or treated as described above. Pancolonic diverticulosis. Bx-tubular adenomas, rectal hyperplastic polyp. next colonoscopy in 06/2015.  [name] COLONOSCOPY N/A 06/22/2016   Procedure: COLONOSCOPY;  Surgeon: Aviva Signs, MD;  Location: [facility];  Service: Gastroenterology;  Laterality: N/A;  730  . CORONARY ANGIOPLASTY WITH STENT PLACEMENT    . CORONARY ARTERY BYPASS GRAFT  1996   LIMA to LAD, SVG to D2, SVG to PDA, SVG to OM1 and OM2  . ESOPHAGOGASTRODUODENOSCOPY N/A 03/16/2013   Procedure: ESOPHAGOGASTRODUODENOSCOPY (EGD);  Surgeon: Daneil Dolin, MD;  Location: [facility];  Service: Endoscopy;  Laterality: N/A;  12:00-moved to [place] notified pt  . ESOPHAGOGASTRODUODENOSCOPY N/A 03/06/2013   Procedure: ESOPHAGOGASTRODUODENOSCOPY (EGD);  Surgeon: Daneil Dolin, MD;  Location:  [facility];  Service: Endoscopy;  Laterality: N/A;  . HERNIA REPAIR       reports that he quit smoking about 23 years ago. His smoking use included cigarettes. He has a 80.00 pack-year smoking history. He has never used smokeless tobacco. He reports that he does not drink alcohol or use drugs.  No Known Allergies  Family History  Problem Relation Age of Onset  . Heart disease Unknown     . Heart attack Mother   . Colon cancer Neg Hx     Prior to Admission medications   Medication Sig Start Date End Date Taking? Authorizing Provider  albuterol (PROVENTIL) (2.5 MG/3ML) 0.083% nebulizer solution Take 3 mLs (2.5 mg total) by nebulization every 6 (six) hours as needed for wheezing or shortness of breath. Patient will also need the nebulizer machine with this prescription. 10/22/18   Nat Christen, MD  ALPRAZolam Duanne Moron) 1 MG tablet Take 1 tablet (1 mg total) by mouth 2 (two) times daily. For anxiety/sleep Patient taking differently: Take 1.5 mg by mouth 2 (two) times daily as needed. For anxiety/sleep 12/25/16   Thurnell Lose, MD  azithromycin (ZITHROMAX) 250 MG tablet Take 1 tablet (250 mg total) by mouth daily. Take first 2 tablets together, then 1 every day until finished. 10/22/18   Nat Christen, MD  clopidogrel (PLAVIX) 75 MG tablet Take 1 tablet (75 mg total) by mouth daily. 01/29/15   Hosie Poisson, MD  docusate sodium (COLACE) 250 MG capsule Take 250 mg by mouth daily.    [provider]  dutasteride (AVODART) 0.5 MG capsule Take 0.5 mg by mouth daily.    [provider]  escitalopram (LEXAPRO) 10 MG tablet Take 10 mg by mouth daily.    [provider]  furosemide (LASIX) 20 MG tablet Take 20 mg by mouth 2 (two) times daily.  12/03/16   [provider]  gabapentin (NEURONTIN) 300 MG capsule Take 300 mg by mouth 2 (two) times daily.    [provider]  isosorbide mononitrate (IMDUR) 30 MG 24 hr tablet Take 60 mg ( 2 tablets)  Am, and 30 mg ( 1 tablet) in the afternoon 10/03/18   Satira Sark, MD  levothyroxine (SYNTHROID, LEVOTHROID) 50 MCG tablet Take 50 mcg by mouth daily. 10/04/16   [provider]  memantine (NAMENDA) 10 MG tablet Take 1 tablet by mouth 2 (two) times daily. 03/05/16   [provider]  nitroGLYCERIN (NITROSTAT) 0.4 MG SL tablet PLACE ONE TABLET UNDER TONGUE EVERY 5 MIN UP TO 3 DOSES AS NEEDED  FORCHEST PAIN. 06/27/18   Satira Sark, MD  oxycodone (ROXICODONE) 30 MG immediate release tablet Take 30 mg by mouth every 4 (four) hours as needed for pain. Take 1 tablet by mouth every 4 to 6 hours as needed for pain.  Max 8 /24 hours. 12/09/16   [provider]  pilocarpine (SALAGEN) 5 MG tablet Take 5 mg by mouth 2 (two) times daily.    [provider]  potassium chloride SA (K-DUR,KLOR-CON) 20 MEQ tablet Take 20 mEq by mouth daily. When taking lasix    [provider]  pravastatin (PRAVACHOL) 40 MG tablet TAKE (1) TABLET BY MOUTH AT BEDTIME. 06/21/16   Lendon Colonel, NP  predniSONE (DELTASONE) 50 MG tablet 3 tabs for 3 days; 2 tabs for 3 days; 1 tab for 3 days 10/22/18   Nat Christen, MD  Sennosides 25 MG TABS Take 1 tablet by mouth every 3 (  three) days. To help with opioid constipation    [provider]  tamsulosin (FLOMAX) 0.4 MG CAPS capsule Take 1 capsule by mouth daily. 02/23/16   [provider]    Physical Exam: Vitals:   11/07/18 2200 11/07/18 2230 11/07/18 2257 11/07/18 2300  BP: (!) 129/57 139/60  133/61  Pulse: 67 67 66 66  Resp: 12 13 12 13   Temp:      TempSrc:      SpO2: 93% 93% 95% 94%   Physical Exam  Constitutional: He appears well-developed and well-nourished. No distress.  Sleeping comfortably in a hospital stretcher  HENT:  Head: Normocephalic.  Mouth/Throat: Oropharynx is clear and moist.  Eyes: Right eye exhibits no discharge. Left eye exhibits no discharge.  Neck: Neck supple. No tracheal deviation present.  Cardiovascular: Normal rate, regular rhythm and intact distal pulses.  Pulmonary/Chest: Effort normal. He has no wheezes. He has no rales.  Anterior lung fields clear to auscultation  Abdominal: Soft. Bowel sounds are normal. He exhibits no distension. There is no tenderness.  Musculoskeletal: He exhibits no edema.  Neurological:  Very somnolent Unable to do a neuro exam at this time.  Skin: Skin  is warm and dry. He is not diaphoretic.     Labs on Admission: I have personally reviewed following labs and imaging studies  CBC: Recent Labs  Lab 11/07/18 2030  WBC 14.9*  HGB 13.0  HCT 40.3  MCV 97.8  PLT 440   Basic Metabolic Panel: Recent Labs  Lab 11/07/18 2030  NA 135  K 3.7  CL 99  CO2 24  GLUCOSE 280*  BUN 17  CREATININE 1.06  CALCIUM 9.3   GFR: CrCl cannot be calculated (Unknown ideal weight.). Liver Function Tests: No results for input(s): AST, ALT, ALKPHOS, BILITOT, PROT, ALBUMIN in the last 168 hours. No results for input(s): LIPASE, AMYLASE in the last 168 hours. No results for input(s): AMMONIA in the last 168 hours. Coagulation Profile: No results for input(s): INR, PROTIME in the last 168 hours. Cardiac Enzymes: No results for input(s): CKTOTAL, CKMB, CKMBINDEX, TROPONINI in the last 168 hours. BNP (last 3 results) No results for input(s): PROBNP in the last 8760 hours. HbA1C: No results for input(s): HGBA1C in the last 72 hours. CBG: No results for input(s): GLUCAP in the last 168 hours. Lipid Profile: No results for input(s): CHOL, HDL, LDLCALC, TRIG, CHOLHDL, LDLDIRECT in the last 72 hours. Thyroid Function Tests: No results for input(s): TSH, T4TOTAL, FREET4, T3FREE, THYROIDAB in the last 72 hours. Anemia Panel: No results for input(s): VITAMINB12, FOLATE, FERRITIN, TIBC, IRON, RETICCTPCT in the last 72 hours. Urine analysis:    Component Value Date/Time   COLORURINE YELLOW 10/15/2016 [place] 10/15/2016 1115   LABSPEC 1.025 10/15/2016 1115   PHURINE 6.0 10/15/2016 1115   GLUCOSEU NEGATIVE 10/15/2016 1115   HGBUR TRACE (A) 10/15/2016 1115   BILIRUBINUR NEGATIVE 10/15/2016 1115   KETONESUR TRACE (A) 10/15/2016 1115   PROTEINUR 30 (A) 10/15/2016 1115   UROBILINOGEN 0.2 01/26/2015 2247   NITRITE NEGATIVE 10/15/2016 1115   LEUKOCYTESUR TRACE (A) 10/15/2016 1115    Radiological Exams on Admission: Dg Chest 2  View  Result Date: 11/07/2018 CLINICAL DATA:  Mid chest pain and left arm pain. EXAM: CHEST - 2 VIEW COMPARISON:  October 22, 2018 FINDINGS: Cardiomediastinal silhouette is stable. No pneumothorax. No nodules or masses. Mild bibasilar opacities, likely atelectasis. No overt edema. IMPRESSION: Mild bibasilar opacities are favored represent atelectasis. Infiltrates considered  less likely. No overt edema. Electronically Signed   By: Dorise Bullion III M.D   On: 11/07/2018 21:01   Ct Head Wo Contrast  Result Date: 11/07/2018 CLINICAL DATA:  Dizziness.  Fall. EXAM: CT HEAD WITHOUT CONTRAST TECHNIQUE: Contiguous axial images were obtained from the base of the skull through the vertex without intravenous contrast. COMPARISON:  April 03, 2016 FINDINGS: Brain: No subdural, epidural, or subarachnoid hemorrhage. Cerebellum, brainstem, and basal cisterns are normal. Mild white matter changes. No acute cortical ischemia infarct. No mass effect or midline shift. Ventricles and sulci are normal. Vascular: Calcified atherosclerosis in the intracranial carotids. Skull: Normal. Negative for fracture or focal lesion. Sinuses/Orbits: No acute finding. Other: None. IMPRESSION: 1. No acute intracranial abnormalities identified. Electronically Signed   By: Dorise Bullion III M.D   On: 11/07/2018 23:46    EKG: Independently reviewed. T wave inversions in inferior and lateral leads which are new compared to prior EKG from October 23, 2018.  Assessment/Plan Principal Problem:   Unstable angina (HCC) Active Problems:   Hyperlipidemia   Alzheimer disease (HCC)   HYPERTENSION, BENIGN   Hypothyroidism   Fall   Gait instability   Leukocytosis   Depression   BPH (benign prostatic hyperplasia)   Chronic pain   Unstable angina Patient is presenting with symptoms concerning for unstable angina -several episodes of chest pain today despite taking 8-9 doses of nitroglycerin.  Currently chest pain-free.  I-STAT troponin  negative.  EKG showing T wave inversions in inferior and lateral leads which are new compared to prior EKG from October 23, 2018.  He has a history of multivessel coronary artery disease status post CABG and subsequent DES intervention in 2012.  Lexiscan Myoview done in 2018 showing combination of scar and peri-infarct ischemia in the inferior wall which has been managed medically by cardiology. -Start heparin drip -Give aspirin.  Continue home Plavix. -Continue to trend troponin -Not on a beta-blocker due to history of symptomatic bradycardia -Continue home Imdur -Sublingual nitroglycerin as needed -Zofran PRN nausea -Monitor on telemetry -Echocardiogram -Consult cardiology in the morning -Keep n.p.o. after midnight  Recent fall, gait instability CT head negative for acute abnormality. -Tylenol as needed for headaches -PT evaluation  Leukocytosis White count 14.9.  Patient is very somnolent and I am unable to get a complete history from him at this time.  Chest x-ray showing mild bibasilar opacities which are favored to represent atelectasis; infiltrates less likely. -Check UA -Check procalcitonin level -Repeat CBC in a.m.  Essential hypertension -Currently normotensive.  Continue home Imdur.  Hyperlipidemia -Continue home pravastatin  Hypothyroidism -Continue home Synthroid  Depression -Continue home BuSpar, Lexapro  BPH -Continue home Avodart, Flomax  Chronic pain/neuropathy -Continue home gabapentin  Alzheimer's disease -Continue home Namenda  DVT prophylaxis: SCDs at this time. STAT head CT pending.  If negative for bleed, patient will be started on IV heparin for unstable angina. Code Status: Patient is very somnolent and CODE STATUS could not be discussed with him at this time.  Wife and son at bedside want the patient to be full code. Family Communication: Wife and son at bedside. Disposition Plan: Anticipate discharge to home in 1 to 2 days. Consults called:  None Admission status: Observation  Shela Leff MD [name] Pager [phone]  If 7PM-7AM, please contact night-coverage [email or web address] Password [email or web address]  11/08/2018, 12:02 AM

## 2018-11-07 NOTE — ED Provider Notes (Signed)
[facility] EMERGENCY DEPARTMENT Provider Note   CSN: [id number] Arrival date & time: 11/07/18  2008     History   Chief Complaint Chief Complaint  Patient presents with  . Chest Pain    HPI FINAS DELONE is a 84 y.o. male.  HPI Patient presents with chest pain.  Began today.  Mid chest and going up to neck.  The pressure feels somewhat like his previous heart attack but states it is not as severe.  Does have a history of angina and does have to take nitro somewhat frequently.  States that normally go away with 3 nitroglycerin but today he is taken 8 of them.  States the pain is much better now however.  Sees Dr. Domenic Polite.  Now feels short of breath.  No sweatiness.  States his been taking his medicines.  No swelling in his legs.  No fevers.  No cough. Past Medical History:  Diagnosis Date  . Alzheimer disease ([place])   . Anxiety   . Arthritis   . Atrophic gastritis    a. By EGD 02/2013.  . Carotid artery disease ([place])    a. mild-mod plaque, <50% stenosis bilat by duplex 2018.  [name] Coronary atherosclerosis of native coronary artery    a. Multivessel s/p CABG 1996. b. s/p DES x 2 SVG to PDA 8/12 with distal disease managed medically.  . DDD (degenerative disc disease)    Chronic back pain  . Enlarged prostate   . Essential hypertension, benign   . Hematuria   . Hypothyroidism   . LBBB (left bundle branch block)   . MI (myocardial infarction) ([place])   . Mixed hyperlipidemia   . OA (osteoarthritis)   . OSA (obstructive sleep apnea)   . Sinus bradycardia    a. Aricept and BB discontinued due to this.    Patient Active Problem List   Diagnosis Date Noted  . Pressure injury of skin 02/15/2017  . Chronic diastolic CHF (congestive heart failure) ([place]) 12/24/2016  . Sinus bradycardia 12/23/2016  . Foot pain, bilateral 12/23/2016  . Hypokalemia 04/05/2016  . Acute respiratory failure ([place]) 04/03/2016  . Acute encephalopathy 04/03/2016  . Hypothyroidism 04/03/2016  .  Aspiration pneumonia ([place]) 04/03/2016  . Dementia ([place]) 04/03/2016  . Acute respiratory failure with hypoxia ([place]) 04/03/2016  . Memory loss 04/20/2015  . Hypoxia 01/27/2015  . CAP (community acquired pneumonia) 01/27/2015  . Fever 08/30/2014  . Healthcare associated bacterial pneumonia 08/30/2014  . Toxic Metabolic encephalopathy 38/09/1750  . Sepsis ([place]) 08/30/2014  . Neck pain on right side 08/29/2014  . Neck pain 08/29/2014  . Left-sided weakness 06/28/2014  . Chest pain 06/27/2014  . Tubular adenoma of colon 03/05/2013  . Anorexia 03/05/2013  . Loss of weight 03/05/2013  . Chronic constipation 07/04/2012  . Bronchitis 08/16/2011  . Hyperlipidemia 10/01/2009  . OSA (obstructive sleep apnea) 10/01/2009  . [place] DISEASE 10/01/2009  . HYPERTENSION, BENIGN 10/01/2009  . Coronary artery disease 10/01/2009    Past Surgical History:  Procedure Laterality Date  . COLONOSCOPY  08/03/2004   Jenkins-numerous large diverticula in the descending, transverse, descending, and sigmoid colon. Otherwise normal exam.  . COLONOSCOPY  07/12/2012   RMR: External hemorrhoidal tag; multiple rectal and colonic polyps removed and/or treated as described above. Pancolonic diverticulosis. Bx-tubular adenomas, rectal hyperplastic polyp. next colonoscopy in 06/2015.  [name] COLONOSCOPY N/A 06/22/2016   Procedure: COLONOSCOPY;  Surgeon: Aviva Signs, MD;  Location: [facility] ENDO SUITE;  Service: Gastroenterology;  Laterality: N/A;  730  .  CORONARY ANGIOPLASTY WITH STENT PLACEMENT    . CORONARY ARTERY BYPASS GRAFT  1996   LIMA to LAD, SVG to D2, SVG to PDA, SVG to OM1 and OM2  . ESOPHAGOGASTRODUODENOSCOPY N/A 03/16/2013   Procedure: ESOPHAGOGASTRODUODENOSCOPY (EGD);  Surgeon: Daneil Dolin, MD;  Location: [facility] ENDO SUITE;  Service: Endoscopy;  Laterality: N/A;  12:00-moved to [place] notified pt  . ESOPHAGOGASTRODUODENOSCOPY N/A 03/06/2013   Procedure: ESOPHAGOGASTRODUODENOSCOPY (EGD);  Surgeon: Daneil Dolin, MD;  Location: [facility] END[facility] SU[facility];  Service: Endoscopy;  Laterality: N/A;  . HERNIA REPAIR          Home Medications    Prior to Admission medications   Medication Sig Start Date End Date Taking? Authorizing Provider  albuterol (PROVENTIL) (2.5 MG/3ML) 0.083% nebulizer solution Take 3 mLs (2.5 mg total) by nebulization every 6 (six) hours as needed for wheezing or shortness of breath. Patient will also need the nebulizer machine with this prescription. 10/22/18   Nat Christen, MD  ALPRAZolam Duanne Moron) 1 MG tablet Take 1 tablet (1 mg total) by mouth 2 (two) times daily. For anxiety/sleep Patient taking differently: Take 1.5 mg by mouth 2 (two) times daily as needed. For anxiety/sleep 12/25/16   Thurnell Lose, MD  azithromycin (ZITHROMAX) 250 MG tablet Take 1 tablet (250 mg total) by mouth daily. Take first 2 tablets together, then 1 every day until finished. 10/22/18   Nat Christen, MD  clopidogrel (PLAVIX) 75 MG tablet Take 1 tablet (75 mg total) by mouth daily. 01/29/15   Hosie Poisson, MD  docusate sodium (COLACE) 250 MG capsule Take 250 mg by mouth daily.    [provider]  dutasteride (AVODART) 0.5 MG capsule Take 0.5 mg by mouth daily.    [provider]  escitalopram (LEXAPRO) 10 MG tablet Take 10 mg by mouth daily.    [provider]  furosemide (LASIX) 20 MG tablet Take 20 mg by mouth 2 (two) times daily.  12/03/16   [provider]  gabapentin (NEURONTIN) 300 MG capsule Take 300 mg by mouth 2 (two) times daily.    [provider]  isosorbide mononitrate (IMDUR) 30 MG 24 hr tablet Take 60 mg ( 2 tablets)  Am, and 30 mg ( 1 tablet) in the afternoon 10/03/18   Satira Sark, MD  levothyroxine (SYNTHROID, LEVOTHROID) 50 MCG tablet Take 50 mcg by mouth daily. 10/04/16   [provider]  memantine (NAMENDA) 10 MG tablet Take 1 tablet by mouth 2 (two) times daily. 03/05/16   [provider]  nitroGLYCERIN (NITROSTAT) 0.4 MG SL  tablet PLACE ONE TABLET UNDER TONGUE EVERY 5 MIN UP TO 3 DOSES AS NEEDED FORCHEST PAIN. 06/27/18   Satira Sark, MD  oxycodone (ROXICODONE) 30 MG immediate release tablet Take 30 mg by mouth every 4 (four) hours as needed for pain. Take 1 tablet by mouth every 4 to 6 hours as needed for pain.  Max 8 /24 hours. 12/09/16   [provider]  pilocarpine (SALAGEN) 5 MG tablet Take 5 mg by mouth 2 (two) times daily.    [provider]  potassium chloride SA (K-DUR,KLOR-CON) 20 MEQ tablet Take 20 mEq by mouth daily. When taking lasix    [provider]  pravastatin (PRAVACHOL) 40 MG tablet TAKE (1) TABLET BY MOUTH AT BEDTIME. 06/21/16   Lendon Colonel, NP  predniSONE (DELTASONE) 50 MG tablet 3 tabs for 3 days; 2 tabs for 3 days; 1 tab for 3 days  10/22/18   Nat Christen, MD  Sennosides 25 MG TABS Take 1 tablet by mouth every 3 (three) days. To help with opioid constipation    [provider]  tamsulosin (FLOMAX) 0.4 MG CAPS capsule Take 1 capsule by mouth daily. 02/23/16   [provider]    Family History Family History  Problem Relation Age of Onset  . Heart disease Unknown   . Heart attack Mother   . Colon cancer Neg Hx     Social History Social History   Tobacco Use  . Smoking status: Former Smoker    Packs/day: 2.00    Years: 40.00    Pack years: 80.00    Types: Cigarettes    Last attempt to quit: 12/27/1994    Years since quitting: 23.8  . Smokeless tobacco: Never Used  Substance Use Topics  . Alcohol use: No    Alcohol/week: 0.0 standard drinks  . Drug use: No     Allergies   Patient has no known allergies.   Review of Systems Review of Systems  Constitutional: Negative for appetite change.  HENT: Negative for congestion.   Respiratory: Negative for shortness of breath.   Cardiovascular: Positive for chest pain. Negative for leg swelling.  Gastrointestinal: Negative for abdominal pain.  Genitourinary: Negative for flank  pain.  Skin: Negative for rash.  Neurological: Negative for weakness.  Hematological: Negative for adenopathy.  Psychiatric/Behavioral: Negative for confusion.     Physical Exam Updated Vital Signs BP 125/67   Pulse 69   Temp (!) 97.3 F (36.3 C) (Oral)   Resp 19   SpO2 94%   Physical Exam  Constitutional: He appears well-developed.  HENT:  Head: Atraumatic.  Eyes: Pupils are equal, round, and reactive to light.  Neck: Neck supple.  Cardiovascular: Normal rate and regular rhythm.  Pulmonary/Chest: Effort normal.  No tenderness.  Musculoskeletal:       Right lower leg: He exhibits no tenderness.       Left lower leg: He exhibits no tenderness.  Neurological: He is alert.  Skin: Skin is warm. Capillary refill takes less than 2 seconds.     ED Treatments / Results  Labs (all labs ordered are listed, but only abnormal results are displayed) Labs Reviewed  BASIC METABOLIC PANEL - Abnormal; Notable for the following components:      Result Value   Glucose, Bld 280 (*)    All other components within normal limits  CBC - Abnormal; Notable for the following components:   WBC 14.9 (*)    RBC 4.12 (*)    All other components within normal limits  I-STAT TROPONIN, ED    EKG EKG Interpretation  Date/Time:  Tuesday November 07 2018 20:16:46 EST Ventricular Rate:  78 PR Interval:  176 QRS Duration: 130 QT Interval:  408 QTC Calculation: 465 R Axis:   30 Text Interpretation:  Sinus rhythm with Premature atrial complexes Left bundle branch block Abnormal ECG Confirmed by Davonna Belling [phone]) on 11/07/2018 8:25:59 PM   Radiology Dg Chest 2 View  Result Date: 11/07/2018 CLINICAL DATA:  Mid chest pain and left arm pain. EXAM: CHEST - 2 VIEW COMPARISON:  October 22, 2018 FINDINGS: Cardiomediastinal silhouette is stable. No pneumothorax. No nodules or masses. Mild bibasilar opacities, likely atelectasis. No overt edema. IMPRESSION: Mild bibasilar opacities are favored  represent atelectasis. Infiltrates considered less likely. No overt edema. Electronically Signed   By: Dorise Bullion III M.D   On: 11/07/2018 21:01    Procedures  Procedures (including critical care time)  Medications Ordered in ED Medications  ALPRAZolam Duanne Moron) tablet 1 mg (1 mg Oral Given 11/07/18 2123)     Initial Impression / Assessment and Plan / ED Course  I have reviewed the triage vital signs and the nursing notes.  Pertinent labs & imaging results that were available during my care of the patient were reviewed by me and considered in my medical decision making (see chart for details).     Patient with chest pain.  Dull mid chest like previous angina.  Pain-free now.  EKG overall reassuring.  Troponin negative.  Will admit to hospitalist  Final Clinical Impressions(s) / ED Diagnoses   Final diagnoses:  Chest pain, unspecified type    ED Discharge Orders    None       Davonna Belling, MD 11/07/18 2137

## 2018-11-08 ENCOUNTER — Encounter (INDEPENDENT_AMBULATORY_CARE_PROVIDER_SITE_OTHER): Payer: PPO | Admitting: Ophthalmology

## 2018-11-08 ENCOUNTER — Observation Stay (HOSPITAL_BASED_OUTPATIENT_CLINIC_OR_DEPARTMENT_OTHER): Payer: PPO

## 2018-11-08 ENCOUNTER — Other Ambulatory Visit: Payer: Self-pay

## 2018-11-08 DIAGNOSIS — G309 Alzheimer's disease, unspecified: Secondary | ICD-10-CM

## 2018-11-08 DIAGNOSIS — F0280 Dementia in other diseases classified elsewhere without behavioral disturbance: Secondary | ICD-10-CM | POA: Diagnosis not present

## 2018-11-08 DIAGNOSIS — E785 Hyperlipidemia, unspecified: Secondary | ICD-10-CM | POA: Diagnosis not present

## 2018-11-08 DIAGNOSIS — E782 Mixed hyperlipidemia: Secondary | ICD-10-CM

## 2018-11-08 DIAGNOSIS — I340 Nonrheumatic mitral (valve) insufficiency: Secondary | ICD-10-CM | POA: Diagnosis not present

## 2018-11-08 DIAGNOSIS — R079 Chest pain, unspecified: Secondary | ICD-10-CM | POA: Diagnosis not present

## 2018-11-08 DIAGNOSIS — R51 Headache: Secondary | ICD-10-CM

## 2018-11-08 DIAGNOSIS — I10 Essential (primary) hypertension: Secondary | ICD-10-CM

## 2018-11-08 DIAGNOSIS — N400 Enlarged prostate without lower urinary tract symptoms: Secondary | ICD-10-CM

## 2018-11-08 DIAGNOSIS — I25708 Atherosclerosis of coronary artery bypass graft(s), unspecified, with other forms of angina pectoris: Secondary | ICD-10-CM

## 2018-11-08 DIAGNOSIS — G894 Chronic pain syndrome: Secondary | ICD-10-CM | POA: Diagnosis not present

## 2018-11-08 DIAGNOSIS — R9431 Abnormal electrocardiogram [ECG] [EKG]: Secondary | ICD-10-CM | POA: Diagnosis not present

## 2018-11-08 DIAGNOSIS — F329 Major depressive disorder, single episode, unspecified: Secondary | ICD-10-CM | POA: Diagnosis not present

## 2018-11-08 DIAGNOSIS — E039 Hypothyroidism, unspecified: Secondary | ICD-10-CM

## 2018-11-08 LAB — URINALYSIS, ROUTINE W REFLEX MICROSCOPIC
BACTERIA UA: NONE SEEN
BILIRUBIN URINE: NEGATIVE
Glucose, UA: >=500 mg/dL — AB
Hgb urine dipstick: NEGATIVE
Ketones, ur: NEGATIVE mg/dL
LEUKOCYTES UA: NEGATIVE
Nitrite: NEGATIVE
Protein, ur: NEGATIVE mg/dL
SPECIFIC GRAVITY, URINE: 1.018 (ref 1.005–1.030)
pH: 5 (ref 5.0–8.0)

## 2018-11-08 LAB — CBC
HCT: 39 % (ref 39.0–52.0)
Hemoglobin: 12.4 g/dL — ABNORMAL LOW (ref 13.0–17.0)
MCH: 30.7 pg (ref 26.0–34.0)
MCHC: 31.8 g/dL (ref 30.0–36.0)
MCV: 96.5 fL (ref 80.0–100.0)
PLATELETS: 228 10*3/uL (ref 150–400)
RBC: 4.04 MIL/uL — AB (ref 4.22–5.81)
RDW: 12.9 % (ref 11.5–15.5)
WBC: 16.7 10*3/uL — ABNORMAL HIGH (ref 4.0–10.5)
nRBC: 0 % (ref 0.0–0.2)

## 2018-11-08 LAB — ECHOCARDIOGRAM COMPLETE
Height: 70 in
Weight: 3435.65 oz

## 2018-11-08 LAB — PROCALCITONIN

## 2018-11-08 LAB — HEPARIN LEVEL (UNFRACTIONATED): HEPARIN UNFRACTIONATED: 0.23 [IU]/mL — AB (ref 0.30–0.70)

## 2018-11-08 LAB — PROTIME-INR
INR: 0.98
PROTHROMBIN TIME: 12.9 s (ref 11.4–15.2)

## 2018-11-08 LAB — TROPONIN I
Troponin I: <0.03 ng/mL (ref <0.03)
Troponin I: <0.03 ng/mL (ref <0.03)

## 2018-11-08 LAB — APTT: APTT: 30 s (ref 24–36)

## 2018-11-08 MED ORDER — OXYCODONE HCL 5 MG PO TABS
ORAL_TABLET | ORAL | Status: AC
  Filled 2018-11-08: qty 3

## 2018-11-08 MED ORDER — PANTOPRAZOLE SODIUM 40 MG PO TBEC
40.0000 mg | DELAYED_RELEASE_TABLET | Freq: Every day | ORAL | Status: DC
  Administered 2018-11-08: 40 mg via ORAL
  Filled 2018-11-08: qty 1

## 2018-11-08 MED ORDER — PANTOPRAZOLE SODIUM 40 MG PO TBEC: 40.0000 mg | DELAYED_RELEASE_TABLET | Freq: Every day | ORAL | 1 refills | Status: DC

## 2018-11-08 MED ORDER — HEPARIN BOLUS VIA INFUSION
1300.0000 [IU] | Freq: Once | INTRAVENOUS | Status: AC
  Administered 2018-11-08: 1300 [IU] via INTRAVENOUS
  Filled 2018-11-08: qty 1300

## 2018-11-08 MED ORDER — ZOLPIDEM TARTRATE 5 MG PO TABS: 5.0000 mg | ORAL_TABLET | Freq: Once | ORAL | Status: DC

## 2018-11-08 MED ORDER — OXYCODONE HCL 5 MG PO TABS
15.0000 mg | ORAL_TABLET | Freq: Once | ORAL | Status: AC
  Administered 2018-11-08: 15 mg via ORAL

## 2018-11-08 MED ORDER — HEPARIN (PORCINE) 25000 UT/250ML-% IV SOLN
1150.0000 [IU]/h | INTRAVENOUS | Status: DC
  Administered 2018-11-08: 1000 [IU]/h via INTRAVENOUS
  Filled 2018-11-08: qty 250

## 2018-11-08 MED ORDER — PRAVASTATIN SODIUM 40 MG PO TABS: ORAL_TABLET | ORAL | 1 refills | Status: DC

## 2018-11-08 MED ORDER — CALCIUM CARBONATE ANTACID 500 MG PO CHEW: 400.0000 mg | CHEWABLE_TABLET | Freq: Once | ORAL | Status: DC

## 2018-11-08 MED ORDER — ISOSORBIDE MONONITRATE ER 60 MG PO TB24: 30.0000 mg | ORAL_TABLET | Freq: Every evening | ORAL | Status: DC

## 2018-11-08 MED ORDER — HEPARIN BOLUS VIA INFUSION
4000.0000 [IU] | Freq: Once | INTRAVENOUS | Status: AC
  Administered 2018-11-08: 4000 [IU] via INTRAVENOUS

## 2018-11-08 MED ORDER — ACETAMINOPHEN 325 MG PO TABS
ORAL_TABLET | ORAL | Status: AC
  Filled 2018-11-08: qty 2

## 2018-11-08 NOTE — ED Notes (Signed)
Patient sleeping comfortably at this time.

## 2018-11-08 NOTE — Progress Notes (Signed)
*  PRELIMINARY RESULTS* Echocardiogram 2D Echocardiogram has been performed.  [name] 11/08/2018, 11:46 AM

## 2018-11-08 NOTE — Care Management Obs Status (Signed)
[facility] NOTIFICATION   Patient Details  Name: Dustin [name] MRN: [id number] Date of Birth: 04-19-1934   Medicare Observation Status Notification Given:  Yes    Shelda Altes 11/08/2018, 10:53 AM

## 2018-11-08 NOTE — Progress Notes (Signed)
Pt IV removed and site intact. Patient discharged with personal belongings and prescriptions.

## 2018-11-08 NOTE — Consult Note (Addendum)
Cardiology Consult    Patient ID: Dustin [name]; [id number]; 1934/01/22   Admit date: 11/07/2018 Date of Consult: 11/08/2018  Primary Care Provider: [name], MD Primary Cardiologist: Rozann Lesches, MD   Patient Profile    Dustin [name] is a 84 y.o. male with past medical history of CAD (s/p CABG in 1996 with DESx2 to SVG-PDA in 07/2011, NST in 12/2016 intermediate-risk showing peri-infarct ischemia with medical management pursued), HTN, HLD, Hypothyroidism, and dementia (?) who is being seen today for the evaluation of chest pain at the request of Dr. Dyann Kief.   History of Present Illness    Dustin [name] was last examined by Dr. Domenic Polite in 06/2018 and reported having frequent anginal symptoms and was having to utilize sublingual nitroglycerin intermittently. Imdur was increased from 30mg  BID to 60mg  in AM/30mg  in PM along with continuing on Plavix and statin therapy. Was not on BB therapy given history of symptomatic bradycardia.    He presented to [facility] ED on 11/07/2018 for evaluation of throat discomfort, chest pain, and left arm pain. In talking with the patient today, he reports being in his usual state of health until yesterday morning when he developed numbness and weakness along his upper extremities bilaterally. Reports that he had difficulty picking up objects. No associated vision changes at that time but reports having a constant headache over the past several weeks since falling off a ladder. Later in the day, he developed a discomfort along his throat and felt like he could not swallow. Says the discomfort did radiate into his chest and he equated this to consuming fried spam the previous night. He took several SLG NTG with no improvement in his symptoms. He went to lay down and his symptoms intensified, therefore he took more sublingual nitroglycerin and still did not experience improvement and came to the ED for further evaluation. His pain lasted for over 8  hours total and was not associated with exertion. Denies any recent orthopnea, PND, or lower extremity edema.  Initial labs show WBC 14.9, Hgb 13.0, platelets 232, Na+ 135, K+ 3.7, and creatinine 1.06.  Initial and cyclic troponin values have been negative. CXR shows mild bibasilar opacities favored to represent atelectasis. CT Head showing no acute intracranial abnormalities. EKG shows NSR, HR 78, with PAC's, with IVCD and ST depression along the lateral leads which is new since tracings in 2018 (was noted on tracing in 06/2018).  He denies any recurrent pain overnight or this morning. Reports feeling back to his normal state of health and ask when can he go home.   Past Medical History:  Diagnosis Date  . Alzheimer disease ([place])   . Anxiety   . Arthritis   . Atrophic gastritis    a. By EGD 02/2013.  . Carotid artery disease ([place])    a. mild-mod plaque, <50% stenosis bilat by duplex 2018.  [name] Coronary atherosclerosis of native coronary artery    a. Multivessel s/p CABG 1996. b. s/p DES x 2 SVG to PDA 8/12 with distal disease managed medically.  . DDD (degenerative disc disease)    Chronic back pain  . Enlarged prostate   . Essential hypertension, benign   . Hematuria   . Hypothyroidism   . LBBB (left bundle branch block)   . MI (myocardial infarction) ([place])   . Mixed hyperlipidemia   . OA (osteoarthritis)   . OSA (obstructive sleep apnea)   . Sinus bradycardia    a. Aricept and BB discontinued due  to this.    Past Surgical History:  Procedure Laterality Date  . COLONOSCOPY  08/03/2004   Jenkins-numerous large diverticula in the descending, transverse, descending, and sigmoid colon. Otherwise normal exam.  . COLONOSCOPY  07/12/2012   RMR: External hemorrhoidal tag; multiple rectal and colonic polyps removed and/or treated as described above. Pancolonic diverticulosis. Bx-tubular adenomas, rectal hyperplastic polyp. next colonoscopy in 06/2015.  [name] COLONOSCOPY N/A 06/22/2016    Procedure: COLONOSCOPY;  Surgeon: Aviva Signs, MD;  Location: [facility];  Service: Gastroenterology;  Laterality: N/A;  730  . CORONARY ANGIOPLASTY WITH STENT PLACEMENT    . CORONARY ARTERY BYPASS GRAFT  1996   LIMA to LAD, SVG to D2, SVG to PDA, SVG to OM1 and OM2  . ESOPHAGOGASTRODUODENOSCOPY N/A 03/16/2013   Procedure: ESOPHAGOGASTRODUODENOSCOPY (EGD);  Surgeon: Daneil Dolin, MD;  Location: [facility];  Service: Endoscopy;  Laterality: N/A;  12:00-moved to [place] notified pt  . ESOPHAGOGASTRODUODENOSCOPY N/A 03/06/2013   Procedure: ESOPHAGOGASTRODUODENOSCOPY (EGD);  Surgeon: Daneil Dolin, MD;  Location: [facility];  Service: Endoscopy;  Laterality: N/A;  . HERNIA REPAIR       Home Medications:  Prior to Admission medications   Medication Sig Start Date End Date Taking? Authorizing Provider  albuterol (PROVENTIL) (2.5 MG/3ML) 0.083% nebulizer solution Take 3 mLs (2.5 mg total) by nebulization every 6 (six) hours as needed for wheezing or shortness of breath. Patient will also need the nebulizer machine with this prescription. 10/22/18  Yes Nat Christen, MD  ALPRAZolam Duanne Moron) 1 MG tablet Take 1 tablet (1 mg total) by mouth 2 (two) times daily. For anxiety/sleep Patient taking differently: Take 1.5 mg by mouth 2 (two) times daily as needed. For anxiety/sleep 12/25/16  Yes Thurnell Lose, MD  busPIRone (BUSPAR) 10 MG tablet Take 10 mg by mouth 2 (two) times daily.  11/03/18  Yes [provider]  clopidogrel (PLAVIX) 75 MG tablet Take 1 tablet (75 mg total) by mouth daily. 01/29/15  Yes Hosie Poisson, MD  docusate sodium (COLACE) 250 MG capsule Take 250 mg by mouth daily.   Yes [provider]  dutasteride (AVODART) 0.5 MG capsule Take 0.5 mg by mouth daily.   Yes [provider]  escitalopram (LEXAPRO) 10 MG tablet Take 10 mg by mouth daily.   Yes [provider]  furosemide (LASIX) 20 MG tablet Take 20 mg by mouth 2 (two) times daily.   12/03/16  Yes [provider]  gabapentin (NEURONTIN) 300 MG capsule Take 300 mg by mouth 2 (two) times daily.   Yes [provider]  isosorbide mononitrate (IMDUR) 30 MG 24 hr tablet Take 60 mg ( 2 tablets)  Am, and 30 mg ( 1 tablet) in the afternoon Patient taking differently: Take 30-60 mg by mouth See admin instructions. Take 60 mg ( 2 tablets)  Am, and 30 mg ( 1 tablet) in the afternoon 10/03/18  Yes Satira Sark, MD  levofloxacin (LEVAQUIN) 500 MG tablet Take 500 mg by mouth daily. 10 day course starting on 11/03/2018 11/03/18  Yes [provider]  levothyroxine (SYNTHROID, LEVOTHROID) 50 MCG tablet Take 50 mcg by mouth daily. 10/04/16  Yes [provider]  memantine (NAMENDA) 10 MG tablet Take 1 tablet by mouth 2 (two) times daily. 03/05/16  Yes [provider]  nitroGLYCERIN (NITROSTAT) 0.4 MG SL tablet PLACE ONE TABLET UNDER TONGUE EVERY 5 MIN UP TO 3 DOSES AS NEEDED FORCHEST PAIN. Patient taking differently: Place 0.4 mg  under the tongue every 5 (five) minutes as needed for chest pain. PLACE ONE TABLET UNDER TONGUE EVERY 5 MIN UP TO 3 DOSES AS NEEDED FORCHEST PAIN. 06/27/18  Yes Satira Sark, MD  oxycodone (ROXICODONE) 30 MG immediate release tablet Take 30 mg by mouth every 4 (four) hours as needed for pain. Take 1 tablet by mouth every 4 to 6 hours as needed for pain.  Max 8 /24 hours. 12/09/16  Yes [provider]  pilocarpine (SALAGEN) 5 MG tablet Take 5 mg by mouth 2 (two) times daily.   Yes [provider]  potassium chloride SA (K-DUR,KLOR-CON) 20 MEQ tablet Take 20 mEq by mouth daily. When taking lasix   Yes [provider]  pravastatin (PRAVACHOL) 40 MG tablet TAKE (1) TABLET BY MOUTH AT BEDTIME. Patient taking differently: Take 40 mg by mouth every evening.  06/21/16  Yes Lendon Colonel, NP  predniSONE (DELTASONE) 50 MG tablet 3 tabs for 3 days; 2 tabs for 3 days; 1 tab for 3 days Patient taking  differently: Take 50-100 mg by mouth See admin instructions. 2 tabs for 3 days; 1 tab for 3 days 10/22/18  Yes Nat Christen, MD  Sennosides 25 MG TABS Take 1 tablet by mouth every 3 (three) days. To help with opioid constipation   Yes [provider]  tamsulosin (FLOMAX) 0.4 MG CAPS capsule Take 1 capsule by mouth daily. 02/23/16  Yes [provider]  azithromycin (ZITHROMAX) 250 MG tablet Take 1 tablet (250 mg total) by mouth daily. Take first 2 tablets together, then 1 every day until finished. Patient not taking: Reported on 11/07/2018 10/22/18   Nat Christen, MD    Inpatient Medications: Scheduled Meds: . aspirin  324 mg Oral Once  . busPIRone  10 mg Oral BID  . calcium carbonate  400 mg of elemental calcium Oral Once  . clopidogrel  75 mg Oral Daily  . docusate sodium  200 mg Oral Daily  . dutasteride  0.5 mg Oral Daily  . escitalopram  10 mg Oral Daily  . gabapentin  300 mg Oral BID  . isosorbide mononitrate  30 mg Oral QPM  . isosorbide mononitrate  60 mg Oral Q breakfast  . levothyroxine  50 mcg Oral Q0600  . memantine  10 mg Oral BID  . pilocarpine  5 mg Oral BID  . pravastatin  40 mg Oral QPM  . tamsulosin  0.4 mg Oral Daily  . zolpidem  5 mg Oral Once   Continuous Infusions: . heparin 1,000 Units/hr (11/08/18 0217)   PRN Meds: acetaminophen, albuterol, nitroGLYCERIN, ondansetron (ZOFRAN) IV  Allergies:   No Known Allergies  Social History:   Social History   Socioeconomic History  . Marital status: Married    Spouse name: Not on file  . Number of children: 3  . Years of education: Not on file  . Highest education level: Not on file  Occupational History  . Occupation: retired    Fish farm manager: RETIRED  Social Needs  . Financial resource strain: Not on file  . Food insecurity:    Worry: Not on file    Inability: Not on file  . Transportation needs:    Medical: Not on file    Non-medical: Not on file  Tobacco Use  . Smoking status: Former  Smoker    Packs/day: 2.00    Years: 40.00    Pack years: 80.00    Types: Cigarettes    Last attempt to quit: 12/27/1994  Years since quitting: 23.8  . Smokeless tobacco: Never Used  Substance and Sexual Activity  . Alcohol use: No    Alcohol/week: 0.0 standard drinks  . Drug use: No  . Sexual activity: Not Currently  Lifestyle  . Physical activity:    Days per week: Not on file    Minutes per session: Not on file  . Stress: Not on file  Relationships  . Social connections:    Talks on phone: Not on file    Gets together: Not on file    Attends religious service: Not on file    Active member of club or organization: Not on file    Attends meetings of clubs or organizations: Not on file    Relationship status: Not on file  . Intimate partner violence:    Fear of current or ex partner: Not on file    Emotionally abused: Not on file    Physically abused: Not on file    Forced sexual activity: Not on file  Other Topics Concern  . Not on file  Social History Narrative   Lives w/ ailing wife.   Son brings meals 3x per week     Family History:    Family History  Problem Relation Age of Onset  . Heart disease Unknown   . Heart attack Mother   . Colon cancer Neg Hx       Review of Systems    General:  No chills, fever, night sweats or weight changes.  Cardiovascular:  No dyspnea on exertion, edema, orthopnea, palpitations, paroxysmal nocturnal dyspnea. Positive for chest pain.  Dermatological: No rash, lesions/masses Respiratory: No cough, dyspnea Urologic: No hematuria, dysuria Abdominal:   No nausea, vomiting, diarrhea, bright red blood per rectum, melena, or hematemesis Neurologic:  No visual changes, changes in mental status. Positive for headaches and weakness along upper extremities.   All other systems reviewed and are otherwise negative except as noted above.  Physical Exam/Data    Vitals:   11/08/18 0800 11/08/18 0815 11/08/18 0830 11/08/18 0927  BP: (!)  158/65  (!) 151/78 (!) 147/69  Pulse: (!) 56 (!) 58 (!) 56 66  Resp: 16   16  Temp:    97.8 F (36.6 C)  TempSrc:    Oral  SpO2: 94% 93% 95% 96%  Weight:    97.4 kg  Height:    5\' 10"  (1.778 m)    Intake/Output Summary (Last 24 hours) at 11/08/2018 0932 Last data filed at 11/08/2018 0300 Gross per 24 hour  Intake -  Output 400 ml  Net -400 ml   Filed Weights   11/08/18 0021 11/08/18 0927  Weight: 97.5 kg 97.4 kg   Body mass index is 30.81 kg/m.   General: Pleasant, Caucasian male appearing in NAD.  Psych: Normal affect. Neuro: Alert and oriented X 3. Moves all extremities spontaneously. HEENT: Normal  Neck: Supple without bruits or JVD. Lungs:  Resp regular and unlabored, CTA without wheezing or rales. Heart: RRR no s3, s4, or murmurs. Abdomen: Soft, non-tender, non-distended, BS + x 4.  Extremities: No clubbing, cyanosis or lower extremity edema. DP/PT/Radials 2+ and equal bilaterally.  Labs/Studies     Relevant CV Studies:  NST: 12/2016  Inferior defect that shows partial improvement consistent with scar/soft tissue attenuation and periinfarct ischemia  LVEF 46%  Intermediate risk study   Echocardiogram: 02/2017 Study Conclusions  - Left ventricle: The cavity size was normal. Wall thickness was   increased in a pattern of  mild LVH. Systolic function was normal.   The estimated ejection fraction was 55%. Doppler parameters are   consistent with abnormal left ventricular relaxation (grade 1   diastolic dysfunction). - Regional wall motion abnormality: Mild hypokinesis of the mid   inferior myocardium. - Aortic valve: Moderately calcified annulus. Trileaflet. There was   no stenosis. - Mitral valve: There was mild regurgitation. - Left atrium: The atrium was mildly to moderately dilated. - Right ventricle: Systolic function was mildly reduced. - Right atrium: The atrium was mildly dilated. - Tricuspid valve: There was mild regurgitation.  Laboratory  Data:  Chemistry Recent Labs  Lab 11/07/18 2030  NA 135  K 3.7  CL 99  CO2 24  GLUCOSE 280*  BUN 17  CREATININE 1.06  CALCIUM 9.3  GFRNONAA >60  GFRAA >60  ANIONGAP 12    No results for input(s): PROT, ALBUMIN, AST, ALT, ALKPHOS, BILITOT in the last 168 hours. Hematology Recent Labs  Lab 11/07/18 2030 11/08/18 0536  WBC 14.9* 16.7*  RBC 4.12* 4.04*  HGB 13.0 12.4*  HCT 40.3 39.0  MCV 97.8 96.5  MCH 31.6 30.7  MCHC 32.3 31.8  RDW 13.0 12.9  PLT 232 228   Cardiac Enzymes Recent Labs  Lab 11/07/18 2349 11/08/18 0536  TROPONINI <0.03 <0.03    Recent Labs  Lab 11/07/18 2035  TROPIPOC 0.01    BNPNo results for input(s): BNP, PROBNP in the last 168 hours.  DDimer No results for input(s): DDIMER in the last 168 hours.  Radiology/Studies:  Dg Chest 2 View  Result Date: 11/07/2018 CLINICAL DATA:  Mid chest pain and left arm pain. EXAM: CHEST - 2 VIEW COMPARISON:  October 22, 2018 FINDINGS: Cardiomediastinal silhouette is stable. No pneumothorax. No nodules or masses. Mild bibasilar opacities, likely atelectasis. No overt edema. IMPRESSION: Mild bibasilar opacities are favored represent atelectasis. Infiltrates considered less likely. No overt edema. Electronically Signed   By: Dorise Bullion III M.D   On: 11/07/2018 21:01   Ct Head Wo Contrast  Result Date: 11/07/2018 CLINICAL DATA:  Dizziness.  Fall. EXAM: CT HEAD WITHOUT CONTRAST TECHNIQUE: Contiguous axial images were obtained from the base of the skull through the vertex without intravenous contrast. COMPARISON:  April 03, 2016 FINDINGS: Brain: No subdural, epidural, or subarachnoid hemorrhage. Cerebellum, brainstem, and basal cisterns are normal. Mild white matter changes. No acute cortical ischemia infarct. No mass effect or midline shift. Ventricles and sulci are normal. Vascular: Calcified atherosclerosis in the intracranial carotids. Skull: Normal. Negative for fracture or focal lesion. Sinuses/Orbits: No  acute finding. Other: None. IMPRESSION: 1. No acute intracranial abnormalities identified. Electronically Signed   By: Dorise Bullion III M.D   On: 11/07/2018 23:46     Assessment & Plan    1. Atypical Chest Pain - presented with episodes of his "throat swelling" and discomfort along his throat and radiating into his chest which lasted for over 8 hours and was worse with lying down. No association with exertion and did not improve with the use of SL NTG. - Initial and cyclic troponin values have been negative. EKG shows NSR, HR 78, with PAC's, with IVCD and ST depression along the lateral leads which is new since tracings in 2018 (was noted on tracing in 06/2018). - overall his pain has now resolved and he feels back to baseline. History provided today is quite different from the H&P but per his story today, symptoms are overall atypical. An echocardiogram is pending to assess LV function and wall  motion. If no significant abnormalities, would not anticipate further ischemic evaluation. Would recommend initiation of PPI therapy.   2. CAD - s/p CABG in 1996 with DESx2 to SVG-PDA in 07/2011. Most recent ischemic evaluation was a NST in 12/2016 which was intermediate-risk showing peri-infarct ischemia with medical management pursued. - His presenting symptoms as outlined above are overall atypical for recurrent angina. Enzymes have been negative and a repeat echocardiogram is pending to assess LV function. - Continue Plavix, Imdur, and statin therapy. He has not been on a beta-blocker due to baseline bradycardia.  3. HTN - BP has been variable at 107/50 - 158/90 since admission.  - continue PTA Imdur 60mg  in AM/30mg  in PM.  4. HLD - followed by PCP. Has remained on Pravastatin 40mg  daily. Goal LDL is < 70 with known CAD.   5. Headaches/ Upper Extremity Weakness - CT Head showed no acute intracranial abnormalities.  - weakness has now resolved. Further evaluation per admitting team.   For  questions or updates, please contact [place] Please consult [email or web address] for contact info under Cardiology/STEMI.  Signed, Erma Heritage, PA-C 11/08/2018, 9:32 AM Pager: [phone]  The patient was seen and examined, and I agree with the history, physical exam, assessment and plan as documented above, with modifications as noted below. I have also personally reviewed all relevant documentation, old records, labs, and both radiographic and cardiovascular studies. I have also independently interpreted old and new ECG's.  Briefly, this is an 84 year old gentleman with a history of coronary artery disease and CABG in 1996 with drug-eluting stent placement x2 to the SVG to PDA in August 2012.  He underwent an intermediate risk nuclear stress test in genera 2018 demonstrating peri-infarct ischemia.  He has been medically managed.  He most recently saw Dr. Domenic Polite in July 2019 and due to frequent anginal symptoms, Imdur was increased to 60 mg every morning and 30 mg every afternoon.  He told me that he was sitting yesterday and developed shaking in his arms.  He then developed some blurry vision and had a tightness in his throat followed by chest pains.  He would take several nitroglycerin tablets and would experience about 10 minutes of relief and the pain would recur.  He continued to do this throughout the day and eventually came to the ED.  He told me he ate fried Spam which his son prepared the night before and he says eating such foods usually significantly aggravates his GERD.  He said his symptoms eventually improved sometime last night.  Troponins have been normal.  ECG demonstrates sinus rhythm with nonspecific IVCD and repolarization abnormalities with nonspecific ST segment depressions inferiorly and V4 through V6 with T wave inversions inferiorly and in V5 and V6.  Similar abnormalities were seen in July 2019 as well.  He is asking me when he can go home because he wants  to go to an eye doctor's appointment in [place] this afternoon.  He said his driver's license was taken away and is eager to receive permission to drive again.  Echocardiogram is currently being performed and a quick look at the apical four-chamber view demonstrates normal left ventricular systolic function and regional wall motion.  I think he is stable for discharge to home.  I would recommend close outpatient follow-up as there are typical and atypical features to his symptoms although they have completely resolved.  I will continue his current antianginal regimen with Imdur along with continuing clopidogrel and pravastatin.   If symptoms  recur, Imdur could be increased to 60 mg twice daily. He is not on beta-blockers due to a history of bradycardia.  I also agree with the addition of proton pump inhibitor therapy given his GERD-like symptoms.   Kate Sable, MD, [facility]  11/08/2018 11:15 AM

## 2018-11-08 NOTE — ED Notes (Signed)
Patient given tylenol 650 mg's due to waking up with foot pain.

## 2018-11-08 NOTE — Plan of Care (Signed)
  Problem: Acute Rehab PT Goals(only PT should resolve) Goal: Pt Will Go Supine/Side To Sit Flowsheets (Taken 11/08/2018 1609) Pt will go Supine/Side to Sit: Independently Goal: Patient Will Transfer Sit To/From Stand Flowsheets (Taken 11/08/2018 1609) Patient will transfer sit to/from stand: Independently Goal: Pt Will Perform Standing Balance Or Pre-Gait Flowsheets (Taken 11/08/2018 1609) Pt will perform standing balance or pre-gait : with min guard assist; with minimal assist Goal: Pt Will Ambulate Flowsheets (Taken 11/08/2018 1609) Pt will Ambulate: > 125 feet; with modified independence; with least restrictive assistive device     Geraldine Solar PT, DPT

## 2018-11-08 NOTE — Evaluation (Signed)
Physical Therapy Evaluation Patient Details Name: Dustin [name] MRN: [id number] DOB: August 15, 1934 Today's Date: 11/08/2018   History of Present Illness  Dustin [name] is a 84 y.o. male with medical history significant of Alzheimer's disease, anxiety, coronary artery disease status post CABG and PCI, hypertension, hyperlipidemia, hypothyroidism, left bundle branch block presenting to the hospital for evaluation of chest pain.  Patient was very somnolent after receiving Xanax in the ED and it was difficult to obtain a history from him.  He reported having substernal pressure-like chest pain at rest which lasts about 10 minutes and resolves after taking nitroglycerin.  He continued to have several episodes of chest pain today despite taking 8-9 doses of nitroglycerin.  His chest pain is associated with nausea, diaphoresis, and dizziness.  He is currently chest pain-free.  Wife at bedside states that patient fell from a ladder 2 weeks ago and since then has been losing balance.  He has also been complaining of headaches but wife does state that patient has chronic headaches. He did not seek medical attention at the time of his fall.   Clinical Impression  Pt received in bed, wife at bedside, and agreeable to PT evaluation. Pt from home where he is independent with dressing, bathing, feeding, and with ambulation but pt's wife states that he needs a RW and she frequently makes him use one. Pt has h/o falling; per wife "too many to count" in the last 6 months. Pt currently mod I bed mobility and transfers and supervision for gait with RW. Pt slightly unsteady with gait with RW but did not require physical assistance to maintain balance. He was able to ambualte 360ft with the RW. PT recommending OPPT once medically ready for d/c in order to maximize his strength, balance, and gait, in order to reduce his risk of falling.      Follow Up Recommendations Outpatient PT;Supervision - Intermittent    Equipment  Recommendations  None recommended by PT    Recommendations for Other Services       Precautions / Restrictions Precautions Precautions: Fall Precaution Comments: per wife: "too many falls to count" in the last 6 months (she states he loses his balance and states he loses it backwards); he states that he has a R inner ear infection which he thinks is what causes his falls      Mobility  Bed Mobility Overal bed mobility: Modified Independent                Transfers Overall transfer level: Modified independent Equipment used: Rolling walker (2 wheeled)                Ambulation/Gait Ambulation/Gait assistance: Supervision Gait Distance (Feet): 300 Feet Assistive device: Rolling walker (2 wheeled) Gait Pattern/deviations: Step-through pattern;Staggering right     General Gait Details: slighlty unsteady gait with RW with x2 bouts of leaning R during gait; otherwise, good cadence and speed for age  Stairs            Wheelchair Mobility    Modified Rankin (Stroke Patients Only)       Balance Overall balance assessment: Needs assistance Sitting-balance support: Feet supported Sitting balance-Leahy Scale: Good     Standing balance support: Bilateral upper extremity supported Standing balance-Leahy Scale: Fair Standing balance comment: slightly unsteady during gait with RW                             Pertinent Vitals/Pain  Pain Assessment: Faces Faces Pain Scale: Hurts a little bit Pain Location: neuropathy pain in legs Pain Descriptors / Indicators: Numbness Pain Intervention(s): Limited activity within patient's tolerance;Monitored during session;Repositioned    Home Living Family/patient expects to be discharged to:: Private residence Living Arrangements: Spouse/significant other Available Help at Discharge: Family;Available 24 hours/day Type of Home: House Home Access: Level entry     Home Layout: One level Home Equipment:  Walker - 2 wheels;Cane - single point Additional Comments: States he uses them sometimes when his wife makes him; he doesn't think he needs them    Prior Function Level of Independence: Independent         Comments: Pt states he can dress and bath independently; does not use AD for mobility but wife states he needs to use one for steadiness     Hand Dominance        Extremity/Trunk Assessment   Upper Extremity Assessment Upper Extremity Assessment: Overall WFL for tasks assessed    Lower Extremity Assessment Lower Extremity Assessment: Overall WFL for tasks assessed    Cervical / Trunk Assessment Cervical / Trunk Assessment: Normal  Communication   Communication: No difficulties(slightly hard of hearing)  Cognition Arousal/Alertness: Awake/alert Behavior During Therapy: WFL for tasks assessed/performed Overall Cognitive Status: Within Functional Limits for tasks assessed                                        General Comments      Exercises     Assessment/Plan    PT Assessment Patient needs continued PT services  PT Problem List Decreased strength;Decreased balance;Decreased safety awareness       PT Treatment Interventions DME instruction;Gait training;Therapeutic activities;Therapeutic exercise;Functional mobility training;Balance training    PT Goals (Current goals can be found in the Care Plan section)  Acute Rehab PT Goals Patient Stated Goal: return home PT Goal Formulation: With patient/family Time For Goal Achievement: 11/15/18 Potential to Achieve Goals: Good    Frequency Min 2X/week   Barriers to discharge        Co-evaluation               AM-PAC PT "6 Clicks" Daily Activity  Outcome Measure Difficulty turning over in bed (including adjusting bedclothes, sheets and blankets)?: None Difficulty moving from lying on back to sitting on the side of the bed? : A Little Difficulty sitting down on and standing up from a  chair with arms (e.g., wheelchair, bedside commode, etc,.)?: A Little Help needed moving to and from a bed to chair (including a wheelchair)?: A Little Help needed walking in hospital room?: A Little Help needed climbing 3-5 steps with a railing? : A Little 6 Click Score: 19    End of Session Equipment Utilized During Treatment: Gait belt Activity Tolerance: No increased pain Patient left: in bed;with call bell/phone within reach;with bed alarm set;with family/visitor present Nurse Communication: Mobility status(RN made aware) PT Visit Diagnosis: Unsteadiness on feet (R26.81);History of falling (Z91.81);Other abnormalities of gait and mobility (R26.89);Repeated falls (R29.6);Difficulty in walking, not elsewhere classified (R26.2);Muscle weakness (generalized) (M62.81)    Time: 1534-1600 PT Time Calculation (min) (ACUTE ONLY): 26 min   Charges:   PT Evaluation $PT Eval Low Complexity: 1 Low PT Treatments $Gait Training: 8-22 mins         Geraldine Solar PT, DPT

## 2018-11-08 NOTE — Care Management (Signed)
Observation for chest pain. Pt from home with wife. PT recommends OP PT. Referral sent to [facility] per wifes request. DC home today.

## 2018-11-08 NOTE — Progress Notes (Signed)
ANTICOAGULATION CONSULT NOTE Pharmacy Consult for Heparin Indication: ACS/STEMI  No Known Allergies  Patient Measurements: Height: 5\' 10"  (177.8 cm) Weight: 214 lb 11.7 oz (97.4 kg) IBW/kg (Calculated) : 73 HEPARIN DW (KG): 93.1   Vital Signs: Temp: 97.8 F (36.6 C) (11/13 0927) Temp Source: Oral (11/13 0927) BP: 147/69 (11/13 0927) Pulse Rate: 66 (11/13 0927)  Labs: Recent Labs    11/07/18 2030 11/07/18 2349 11/08/18 0042 11/08/18 0536 11/08/18 1034  HGB 13.0  --   --  12.4*  --   HCT 40.3  --   --  39.0  --   PLT 232  --   --  228  --   APTT  --   --  30  --   --   LABPROT  --   --  12.9  --   --   INR  --   --  0.98  --   --   HEPARINUNFRC  --   --   --   --  0.23*  CREATININE 1.06  --   --   --   --   TROPONINI  --  <0.03  --  <0.03  --    Estimated Creatinine Clearance: 62.9 mL/min (by C-G formula based on SCr of 1.06 mg/dL).  Medical History: Past Medical History:  Diagnosis Date  . Alzheimer disease ([place])   . Anxiety   . Arthritis   . Atrophic gastritis    a. By EGD 02/2013.  . Carotid artery disease ([place])    a. mild-mod plaque, <50% stenosis bilat by duplex 2018.  [name] Coronary atherosclerosis of native coronary artery    a. Multivessel s/p CABG 1996. b. s/p DES x 2 SVG to PDA 8/12 with distal disease managed medically.  . DDD (degenerative disc disease)    Chronic back pain  . Enlarged prostate   . Essential hypertension, benign   . Hematuria   . Hypothyroidism   . LBBB (left bundle branch block)   . MI (myocardial infarction) ([place])   . Mixed hyperlipidemia   . OA (osteoarthritis)   . OSA (obstructive sleep apnea)   . Sinus bradycardia    a. Aricept and BB discontinued due to this.    Medications:   Assessment: 84 yo male with PMH of CAD s/p CABG and PCI seen in the ED for recurrent episodes of chest pain. Cardiology consult is pending. Pharmacy has been consulted for IV heparin dosing.  Goal of Therapy:  Heparin level goal: 0.3-0.7  units/ml Monitor platelets by anticoagulation protocol: Yes   Plan:  Heparin bolus 1300 units IV x 1 Increase Heparin infusion to 1150 units/hr Heparin level in 6-8 hrs   Margot Ables, PharmD Clinical Pharmacist 11/08/2018 11:15 AM

## 2018-11-08 NOTE — Discharge Summary (Signed)
Physician Discharge Summary  Dustin [name] BTD:[id number] DOB: 07/11/1934 DOA: 11/07/2018  PCP: [name], MD  Admit date: 11/07/2018 Discharge date: 11/08/2018  Time spent: 35 minutes  Recommendations for Outpatient Follow-up:  1. Repeat basic metabolic panel to follow electrolytes and renal function 2. Reassess blood pressure and further adjust antihypertensive regimen as needed  Discharge Diagnoses:  Principal Problem:   Unstable angina ([place]) Active Problems:   Hyperlipidemia   Alzheimer disease ([facility])   HYPERTENSION, BENIGN   Hypothyroidism   Fall   Gait instability   Leukocytosis   Depression   BPH (benign prostatic hyperplasia)   Chronic pain   Discharge Condition: Stable and improved.  Patient discharged home with instruction to follow-up with PCP and cardiology service as an outpatient.  Diet recommendation: Heart healthy diet  Filed Weights   11/08/18 0021 11/08/18 0927  Weight: 97.5 kg 97.4 kg    History of present illness:  As per H&P written by Dr. Marlowe Sax on 11/07/18 84 y.o. male with medical history significant of Alzheimer's disease, anxiety, coronary artery disease status post CABG and PCI, hypertension, hyperlipidemia, hypothyroidism, left bundle branch block presenting to the hospital for evaluation of chest pain.  Patient was very somnolent after receiving Xanax in the ED and it was difficult to obtain a history from him.  He reported having substernal pressure-like chest pain at rest which lasts about 10 minutes and resolves after taking nitroglycerin.  He continued to have several episodes of chest pain today despite taking 8-9 doses of nitroglycerin.  His chest pain is associated with nausea, diaphoresis, and dizziness.  He is currently chest pain-free.  Wife at bedside states that patient fell from a ladder 2 weeks ago and since then has been losing balance.  He has also been complaining of headaches but wife does state that patient has chronic  headaches. He did not seek medical attention at the time of his fall.   ED Course: Hemodynamically stable.  White count 14.9.  I-STAT troponin negative.  EKG showing T wave inversions in inferior and lateral leads which are new compared to prior EKG from October 23, 2018.  Chest x-ray showing mild bibasilar opacities which are favored to represent atelectasis; infiltrates less likely; no overt edema.  Hospital Course:  1-chest pain -Patient with a heart score of 5 -Started on heparin drip initially and aspirin was given -Troponins negative x3, no acute abnormalities captured on telemetry or further changes on his EKG. -2D echo reassuring with preserved ejection fraction and just grade 2 diastolic dysfunction; no wall motion abnormalities. -Case discussed with cardiology service who recommended outpatient follow-up and no further acute ischemic work-up -Will continue patient on Plavix, Imdur, statins and as needed NTG. -patient also started on PPI.  2-hypertension -Overall stable -Continue Imdur (60mg  in am and 30mg  PM)  3-continue Pravachol  4-recent fall and gait instability -CT head negative for acute abnormalities -Patient has been seen by physical therapy and outpatient follow-up has been recommended.  5-hypothyroidism -Continue Synthroid  6-depression/anxiety -No suicidal ideation or hallucination -Continue BuSpar and Lexapro  7-BPH -No signs of urinary retention -Continue Avodart and Flomax.  8-history of chronic pain/neuropathy -Continue home pain regimen and the use of gabapentin.  9-Alzheimer's disease -Continue home Namenda and supportive care. -No behavioral disturbances appreciated during hospitalization.   10-GYIRSWN grade 2 diastolic dysfunction -Patient ejection fraction preserved -Condition compensated -Advised to follow heart healthy diet and to check his weight on daily basis.  11-GERD -discharge on PPI  Procedures:  See below for x-ray  reports  2D echo - Left ventricle: The cavity size was normal. Wall thickness was   increased in a pattern of mild LVH. Systolic function was normal.   The estimated ejection fraction was in the range of 55% to 60%.   Wall motion was normal; there were no regional wall motion   abnormalities. Features are consistent with a pseudonormal left   ventricular filling pattern, with concomitant abnormal relaxation   and increased filling pressure (grade 2 diastolic dysfunction).   Doppler parameters are consistent with high ventricular filling   pressure. - Aortic valve: Moderately to severely calcified annulus.   Trileaflet. - Mitral valve: Mildly calcified annulus. Normal thickness leaflets   . There was mild regurgitation. - Left atrium: The atrium was mildly dilated. - Right atrium: The atrium was mildly dilated. - Atrial septum: No defect or patent foramen ovale was identified. - Tricuspid valve: There was mild regurgitation.  Consultations:  Cardiology   Discharge Exam: Vitals:   11/08/18 0927 11/08/18 1505  BP: (!) 147/69 (!) 151/66  Pulse: 66 (!) 55  Resp: 16 18  Temp: 97.8 F (36.6 C) 98 F (36.7 C)  SpO2: 96% 95%    General: Afebrile, no chest pain, no nausea, no vomiting.  Feeling good and back to his baseline. Cardiovascular: S1 and S2, no rubs, no gallops, no JVD. Respiratory: Good air movement bilaterally, no wheezing, no crackles Abdomen: Soft, nontender, nondistended, positive bowel sounds Extremities: No edema, no cyanosis, no clubbing.  Discharge Instructions   Discharge Instructions    Ambulatory referral to Physical Therapy   Complete by:  As directed    Per bedside RN, PT is recommending OP PT, unsure of reason, no note in chart. CP is documented reason for visit. Patient being discharged now. Order placed.   Diet - low sodium heart healthy   Complete by:  As directed    Discharge instructions   Complete by:  As directed    Take medications as  prescribed Follow heart healthy/low-sodium diet Arrange follow-up with PCP in 10 days Follow-up with cardiology service as instructed. Keep yourself well-hydrated.     Allergies as of 11/08/2018   No Known Allergies     Medication List    STOP taking these medications   azithromycin 250 MG tablet Commonly known as:  ZITHROMAX   levofloxacin 500 MG tablet Commonly known as:  LEVAQUIN   predniSONE 50 MG tablet Commonly known as:  DELTASONE     TAKE these medications   albuterol (2.5 MG/3ML) 0.083% nebulizer solution Commonly known as:  PROVENTIL Take 3 mLs (2.5 mg total) by nebulization every 6 (six) hours as needed for wheezing or shortness of breath. Patient will also need the nebulizer machine with this prescription.   ALPRAZolam 1 MG tablet Commonly known as:  XANAX Take 1 tablet (1 mg total) by mouth 2 (two) times daily. For anxiety/sleep What changed:    how much to take  when to take this  reasons to take this   AVODART 0.5 MG capsule Generic drug:  dutasteride Take 0.5 mg by mouth daily.   busPIRone 10 MG tablet Commonly known as:  BUSPAR Take 10 mg by mouth 2 (two) times daily.   clopidogrel 75 MG tablet Commonly known as:  PLAVIX Take 1 tablet (75 mg total) by mouth daily.   docusate sodium 250 MG capsule Commonly known as:  COLACE Take 250 mg by mouth daily.   escitalopram 10 MG tablet Commonly  known as:  LEXAPRO Take 10 mg by mouth daily.   furosemide 20 MG tablet Commonly known as:  LASIX Take 20 mg by mouth 2 (two) times daily.   gabapentin 300 MG capsule Commonly known as:  NEURONTIN Take 300 mg by mouth 2 (two) times daily.   isosorbide mononitrate 30 MG 24 hr tablet Commonly known as:  IMDUR Take 60 mg ( 2 tablets)  Am, and 30 mg ( 1 tablet) in the afternoon What changed:    how much to take  how to take this  when to take this   levothyroxine 50 MCG tablet Commonly known as:  SYNTHROID, LEVOTHROID Take 50 mcg by mouth  daily.   memantine 10 MG tablet Commonly known as:  NAMENDA Take 1 tablet by mouth 2 (two) times daily.   nitroGLYCERIN 0.4 MG SL tablet Commonly known as:  NITROSTAT PLACE ONE TABLET UNDER TONGUE EVERY 5 MIN UP TO 3 DOSES AS NEEDED FORCHEST PAIN. What changed:  See the new instructions.   oxycodone 30 MG immediate release tablet Commonly known as:  ROXICODONE Take 30 mg by mouth every 4 (four) hours as needed for pain. Take 1 tablet by mouth every 4 to 6 hours as needed for pain.  Max 8 /24 hours.   pantoprazole 40 MG tablet Commonly known as:  PROTONIX Take 1 tablet (40 mg total) by mouth daily. Start taking on:  11/09/2018   pilocarpine 5 MG tablet Commonly known as:  SALAGEN Take 5 mg by mouth 2 (two) times daily.   potassium chloride SA 20 MEQ tablet Commonly known as:  K-DUR,KLOR-CON Take 20 mEq by mouth daily. When taking lasix   pravastatin 40 MG tablet Commonly known as:  PRAVACHOL TAKE (1) TABLET BY MOUTH AT BEDTIME. What changed:  See the new instructions.   Sennosides 25 MG Tabs Take 1 tablet by mouth every 3 (three) days. To help with opioid constipation   tamsulosin 0.4 MG Caps capsule Commonly known as:  FLOMAX Take 1 capsule by mouth daily.      No Known Allergies Follow-up Information    [name], MD. Schedule an appointment as soon as possible for a visit in 10 day(s).   Specialty:  Internal Medicine Contact information: [address] [place] [address] [phone]        Satira Sark, MD .   Specialty:  Cardiology Contact information: [place] [address] [phone]           The results of significant diagnostics from this hospitalization (including imaging, microbiology, ancillary and laboratory) are listed below for reference.    Significant Diagnostic Studies: Dg Chest 2 View  Result Date: 11/07/2018 CLINICAL DATA:  Mid chest pain and left arm pain. EXAM: CHEST - 2 VIEW  COMPARISON:  October 22, 2018 FINDINGS: Cardiomediastinal silhouette is stable. No pneumothorax. No nodules or masses. Mild bibasilar opacities, likely atelectasis. No overt edema. IMPRESSION: Mild bibasilar opacities are favored represent atelectasis. Infiltrates considered less likely. No overt edema. Electronically Signed   By: Dorise Bullion III M.D   On: 11/07/2018 21:01   Dg Chest 2 View  Result Date: 10/22/2018 CLINICAL DATA:  Shortness of breath and nonproductive cough. EXAM: CHEST - 2 VIEW COMPARISON:  09/11/2018 and 01/26/2018 FINDINGS: Lungs are adequately inflated without focal airspace consolidation or effusion. Mild stable cardiomegaly. Remainder of the exam is unchanged. IMPRESSION: No acute cardiopulmonary disease. Mild stable cardiomegaly. Electronically Signed   By: Marin Olp M.D.  On: 10/22/2018 20:45   Ct Head Wo Contrast  Result Date: 11/07/2018 CLINICAL DATA:  Dizziness.  Fall. EXAM: CT HEAD WITHOUT CONTRAST TECHNIQUE: Contiguous axial images were obtained from the base of the skull through the vertex without intravenous contrast. COMPARISON:  April 03, 2016 FINDINGS: Brain: No subdural, epidural, or subarachnoid hemorrhage. Cerebellum, brainstem, and basal cisterns are normal. Mild white matter changes. No acute cortical ischemia infarct. No mass effect or midline shift. Ventricles and sulci are normal. Vascular: Calcified atherosclerosis in the intracranial carotids. Skull: Normal. Negative for fracture or focal lesion. Sinuses/Orbits: No acute finding. Other: None. IMPRESSION: 1. No acute intracranial abnormalities identified. Electronically Signed   By: Dorise Bullion III M.D   On: 11/07/2018 23:46   Labs: Basic Metabolic Panel: Recent Labs  Lab 11/07/18 2030  NA 135  K 3.7  CL 99  CO2 24  GLUCOSE 280*  BUN 17  CREATININE 1.06  CALCIUM 9.3   CBC: Recent Labs  Lab 11/07/18 2030 11/08/18 0536  WBC 14.9* 16.7*  HGB 13.0 12.4*  HCT 40.3 39.0  MCV 97.8  96.5  PLT 232 228   Cardiac Enzymes: Recent Labs  Lab 11/07/18 2349 11/08/18 0536 11/08/18 1034  TROPONINI <0.03 <0.03 <0.03    Signed:  Barton Dubois MD.  [name] 11/08/2018, 4:19 PM

## 2018-11-08 NOTE — ED Notes (Signed)
Patient given blankets and urinal. Patient given Tylenol and oxycodone for bilateral foot and leg pain per patient request. Hourly rounding done on the patient.

## 2018-11-08 NOTE — Progress Notes (Signed)
ANTICOAGULATION CONSULT NOTE - Preliminary  Pharmacy Consult for Heparin Indication: ACS/STEMI  No Known Allergies  Patient Measurements: Weight: 215 lb (97.5 kg)     Vital Signs: Temp: 97.3 F (36.3 C) (11/12 2015) Temp Source: Oral (11/12 2015) BP: 120/79 (11/13 0300) Pulse Rate: 65 (11/13 0300)  Labs: Recent Labs    11/07/18 2030 11/07/18 2349 11/08/18 0042  HGB 13.0  --   --   HCT 40.3  --   --   PLT 232  --   --   APTT  --   --  30  LABPROT  --   --  12.9  INR  --   --  0.98  CREATININE 1.06  --   --   TROPONINI  --  <0.03  --    Estimated Creatinine Clearance: 61.9 mL/min (by C-G formula based on SCr of 1.06 mg/dL).  Medical History: Past Medical History:  Diagnosis Date  . Alzheimer disease ([place])   . Anxiety   . Arthritis   . Atrophic gastritis    a. By EGD 02/2013.  . Carotid artery disease ([place])    a. mild-mod plaque, <50% stenosis bilat by duplex 2018.  [name] Coronary atherosclerosis of native coronary artery    a. Multivessel s/p CABG 1996. b. s/p DES x 2 SVG to PDA 8/12 with distal disease managed medically.  . DDD (degenerative disc disease)    Chronic back pain  . Enlarged prostate   . Essential hypertension, benign   . Hematuria   . Hypothyroidism   . LBBB (left bundle branch block)   . MI (myocardial infarction) ([place])   . Mixed hyperlipidemia   . OA (osteoarthritis)   . OSA (obstructive sleep apnea)   . Sinus bradycardia    a. Aricept and BB discontinued due to this.    Medications:   Assessment: 84 yo male with PMH of CAD s/p CABG and PCI seen in the ED for recurrent episodes of chest pain. Cardiology consult is pending. Pharmacy has been consulted for IV heparin dosing.  Goal of Therapy:  Heparin level goal: 0.3-0.7 units/ml Monitor platelets by anticoagulation protocol: Yes   Plan:  Heparin bolus 4000 units IV x 1 Heparin infusion at 1000 units/hr Heparin level in 6-8 hrs  Preliminary review of pertinent patient information  completed.  Forestine Na clinical pharmacist will complete review during morning rounds to assess the patient and finalize treatment regimen.  Norberto Sorenson, [facility] 11/08/2018,3:20 AM

## 2018-11-10 ENCOUNTER — Ambulatory Visit: Payer: PPO | Admitting: Urology

## 2018-11-13 ENCOUNTER — Encounter (INDEPENDENT_AMBULATORY_CARE_PROVIDER_SITE_OTHER): Payer: PPO | Admitting: Ophthalmology

## 2018-11-13 DIAGNOSIS — H59032 Cystoid macular edema following cataract surgery, left eye: Secondary | ICD-10-CM

## 2018-11-13 DIAGNOSIS — H35033 Hypertensive retinopathy, bilateral: Secondary | ICD-10-CM

## 2018-11-13 DIAGNOSIS — H2512 Age-related nuclear cataract, left eye: Secondary | ICD-10-CM

## 2018-11-13 DIAGNOSIS — H353122 Nonexudative age-related macular degeneration, left eye, intermediate dry stage: Secondary | ICD-10-CM

## 2018-11-13 DIAGNOSIS — H35371 Puckering of macula, right eye: Secondary | ICD-10-CM

## 2018-11-13 DIAGNOSIS — I10 Essential (primary) hypertension: Secondary | ICD-10-CM | POA: Diagnosis not present

## 2018-11-13 DIAGNOSIS — D3132 Benign neoplasm of left choroid: Secondary | ICD-10-CM | POA: Diagnosis not present

## 2018-11-13 DIAGNOSIS — H43813 Vitreous degeneration, bilateral: Secondary | ICD-10-CM | POA: Diagnosis not present

## 2018-11-14 DIAGNOSIS — J22 Unspecified acute lower respiratory infection: Secondary | ICD-10-CM | POA: Diagnosis not present

## 2018-11-14 DIAGNOSIS — Z6829 Body mass index (BMI) 29.0-29.9, adult: Secondary | ICD-10-CM | POA: Diagnosis not present

## 2018-11-14 DIAGNOSIS — E663 Overweight: Secondary | ICD-10-CM | POA: Diagnosis not present

## 2018-11-14 DIAGNOSIS — I200 Unstable angina: Secondary | ICD-10-CM | POA: Diagnosis not present

## 2018-12-07 DIAGNOSIS — J9620 Acute and chronic respiratory failure, unspecified whether with hypoxia or hypercapnia: Secondary | ICD-10-CM | POA: Diagnosis not present

## 2018-12-26 DIAGNOSIS — E785 Hyperlipidemia, unspecified: Secondary | ICD-10-CM | POA: Diagnosis not present

## 2018-12-26 DIAGNOSIS — F0390 Unspecified dementia without behavioral disturbance: Secondary | ICD-10-CM | POA: Diagnosis not present

## 2018-12-26 DIAGNOSIS — Z6829 Body mass index (BMI) 29.0-29.9, adult: Secondary | ICD-10-CM | POA: Diagnosis not present

## 2018-12-26 DIAGNOSIS — I10 Essential (primary) hypertension: Secondary | ICD-10-CM | POA: Diagnosis not present

## 2018-12-26 DIAGNOSIS — Z1389 Encounter for screening for other disorder: Secondary | ICD-10-CM | POA: Diagnosis not present

## 2018-12-26 DIAGNOSIS — F1120 Opioid dependence, uncomplicated: Secondary | ICD-10-CM | POA: Diagnosis not present

## 2018-12-26 DIAGNOSIS — E663 Overweight: Secondary | ICD-10-CM | POA: Diagnosis not present

## 2018-12-26 DIAGNOSIS — I872 Venous insufficiency (chronic) (peripheral): Secondary | ICD-10-CM | POA: Diagnosis not present

## 2018-12-26 DIAGNOSIS — G309 Alzheimer's disease, unspecified: Secondary | ICD-10-CM | POA: Diagnosis not present

## 2018-12-26 DIAGNOSIS — M1990 Unspecified osteoarthritis, unspecified site: Secondary | ICD-10-CM | POA: Diagnosis not present

## 2019-01-02 ENCOUNTER — Telehealth: Payer: Self-pay | Admitting: Neurology

## 2019-01-02 NOTE — Telephone Encounter (Signed)
LOV 05/02/17 - will call pt to notify that he will need to be seen.  Dr. Delice Lesch - is there anywhere you would like me to squeeze him in sooner than 07/30/2019?

## 2019-01-02 NOTE — Telephone Encounter (Signed)
I don't see it on Media tab, but his memory testing with Dr. Si Raider recommended he do a Driving Evaluation to determine if he can drive. Has he done this? If yes, pls ask where so we can request results. If not yet done, would do this first then follow-up with me so we can adequately answer the DMV forms. Thanks

## 2019-01-02 NOTE — Telephone Encounter (Signed)
Patient wants to talk to someone about getting his drivers license. He needs to know if he needs to be seen again before she will fill out forms please call

## 2019-01-03 NOTE — Telephone Encounter (Signed)
LMOM with [name] [employer] requesting evaluation results be sent to our office.  Left CB# in case they have questions.

## 2019-01-03 NOTE — Telephone Encounter (Signed)
Spoke with pt.  He states that he had driving evaluation with [name] on December 11, 2018.  I requested their information so I can obtain the results.  Pt provided telephone number [phone].  Will request results.  Pt scheduled for follow up February 4 @ 11:30AM.  Pt states that he will bring DMV paperwork to our office later this week or early next week.

## 2019-01-07 DIAGNOSIS — J9620 Acute and chronic respiratory failure, unspecified whether with hypoxia or hypercapnia: Secondary | ICD-10-CM | POA: Diagnosis not present

## 2019-01-11 ENCOUNTER — Telehealth: Payer: Self-pay | Admitting: Neurology

## 2019-01-11 NOTE — Telephone Encounter (Signed)
Spoke with pt advising of below.  Pt appreciative of my call

## 2019-01-11 NOTE — Telephone Encounter (Signed)
Patient was calling about Driver's License paperwork, checking the status. Please call him back at [phone]. Thanks!

## 2019-01-11 NOTE — Telephone Encounter (Signed)
Paperwork was filled out and placed with outgoing [employer] mail yesterday to return to pt.  Forms require signatures from 3 physicians, PCP was awaiting specialists evaluations before signing off on paperwork.

## 2019-01-19 ENCOUNTER — Other Ambulatory Visit: Payer: Self-pay | Admitting: Cardiology

## 2019-01-23 DIAGNOSIS — Z1389 Encounter for screening for other disorder: Secondary | ICD-10-CM | POA: Diagnosis not present

## 2019-01-23 DIAGNOSIS — Z0001 Encounter for general adult medical examination with abnormal findings: Secondary | ICD-10-CM | POA: Diagnosis not present

## 2019-01-23 DIAGNOSIS — R319 Hematuria, unspecified: Secondary | ICD-10-CM | POA: Diagnosis not present

## 2019-01-23 DIAGNOSIS — G309 Alzheimer's disease, unspecified: Secondary | ICD-10-CM | POA: Diagnosis not present

## 2019-01-23 DIAGNOSIS — N342 Other urethritis: Secondary | ICD-10-CM | POA: Diagnosis not present

## 2019-01-23 DIAGNOSIS — F1120 Opioid dependence, uncomplicated: Secondary | ICD-10-CM | POA: Diagnosis not present

## 2019-01-23 DIAGNOSIS — F341 Dysthymic disorder: Secondary | ICD-10-CM | POA: Diagnosis not present

## 2019-01-23 DIAGNOSIS — Z6829 Body mass index (BMI) 29.0-29.9, adult: Secondary | ICD-10-CM | POA: Diagnosis not present

## 2019-01-23 DIAGNOSIS — N410 Acute prostatitis: Secondary | ICD-10-CM | POA: Diagnosis not present

## 2019-01-23 DIAGNOSIS — I872 Venous insufficiency (chronic) (peripheral): Secondary | ICD-10-CM | POA: Diagnosis not present

## 2019-01-23 DIAGNOSIS — I10 Essential (primary) hypertension: Secondary | ICD-10-CM | POA: Diagnosis not present

## 2019-01-23 DIAGNOSIS — G894 Chronic pain syndrome: Secondary | ICD-10-CM | POA: Diagnosis not present

## 2019-01-25 DIAGNOSIS — Z0001 Encounter for general adult medical examination with abnormal findings: Secondary | ICD-10-CM | POA: Diagnosis not present

## 2019-01-25 DIAGNOSIS — Z6829 Body mass index (BMI) 29.0-29.9, adult: Secondary | ICD-10-CM | POA: Diagnosis not present

## 2019-01-25 DIAGNOSIS — E663 Overweight: Secondary | ICD-10-CM | POA: Diagnosis not present

## 2019-01-25 DIAGNOSIS — Z1389 Encounter for screening for other disorder: Secondary | ICD-10-CM | POA: Diagnosis not present

## 2019-01-29 DIAGNOSIS — L821 Other seborrheic keratosis: Secondary | ICD-10-CM | POA: Diagnosis not present

## 2019-01-30 ENCOUNTER — Ambulatory Visit: Payer: PPO | Admitting: Neurology

## 2019-01-30 ENCOUNTER — Other Ambulatory Visit: Payer: Self-pay

## 2019-01-30 ENCOUNTER — Encounter: Payer: Self-pay | Admitting: Neurology

## 2019-01-30 VITALS — BP 134/68 | HR 72 | Ht 70.0 in | Wt 207.0 lb

## 2019-01-30 DIAGNOSIS — F0280 Dementia in other diseases classified elsewhere without behavioral disturbance: Secondary | ICD-10-CM

## 2019-01-30 DIAGNOSIS — F419 Anxiety disorder, unspecified: Secondary | ICD-10-CM | POA: Diagnosis not present

## 2019-01-30 DIAGNOSIS — G301 Alzheimer's disease with late onset: Secondary | ICD-10-CM

## 2019-01-30 NOTE — Patient Instructions (Signed)
1. Refer to Geriatric Psychiatry (Dr. Casimiro Needle) for anxiety and OCD 2. Continue Namenda 10mg  twice a day 3. Follow-up in 8-12 months, call for any changes  FALL PRECAUTIONS: Be cautious when walking. Scan the area for obstacles that may increase the risk of trips and falls. When getting up in the mornings, sit up at the edge of the bed for a few minutes before getting out of bed. Consider elevating the bed at the head end to avoid drop of blood pressure when getting up. Walk always in a well-lit room (use night lights in the walls). Avoid area rugs or power cords from appliances in the middle of the walkways. Use a walker or a cane if necessary and consider physical therapy for balance exercise. Get your eyesight checked regularly.  FINANCIAL OVERSIGHT: Supervision, especially oversight when making financial decisions or transactions is also recommended.  HOME SAFETY: Consider the safety of the kitchen when operating appliances like stoves, microwave oven, and blender. Consider having supervision and share cooking responsibilities until no longer able to participate in those. Accidents with firearms and other hazards in the house should be identified and addressed as well.  DRIVING: Regarding driving, in patients with progressive memory problems, driving will be impaired. We advise to have someone else do the driving if trouble finding directions or if minor accidents are reported. Independent driving assessment is available to determine safety of driving.  ABILITY TO BE LEFT ALONE: If patient is unable to contact 911 operator, consider using LifeLine, or when the need is there, arrange for someone to stay with patients. Smoking is a fire hazard, consider supervision or cessation. Risk of wandering should be assessed by caregiver and if detected at any point, supervision and safe proof recommendations should be instituted.  MEDICATION SUPERVISION: Inability to self-administer medication needs to be  constantly addressed. Implement a mechanism to ensure safe administration of the medications.  RECOMMENDATIONS FOR ALL PATIENTS WITH MEMORY PROBLEMS: 1. Continue to exercise (Recommend 30 minutes of walking everyday, or 3 hours every week) 2. Increase social interactions - continue going to [place] and enjoy social gatherings with friends and family 3. Eat healthy, avoid fried foods and eat more fruits and vegetables 4. Maintain adequate blood pressure, blood sugar, and blood cholesterol level. Reducing the risk of stroke and cardiovascular disease also helps promoting better memory. 5. Avoid stressful situations. Live a simple life and avoid aggravations. Organize your time and prepare for the next day in anticipation. 6. Sleep well, avoid any interruptions of sleep and avoid any distractions in the bedroom that may interfere with adequate sleep quality 7. Avoid sugar, avoid sweets as there is a strong link between excessive sugar intake, diabetes, and cognitive impairment The Mediterranean diet has been shown to help patients reduce the risk of progressive memory disorders and reduces cardiovascular risk. This includes eating fish, eat fruits and green leafy vegetables, nuts like almonds and hazelnuts, walnuts, and also use olive oil. Avoid fast foods and fried foods as much as possible. Avoid sweets and sugar as sugar use has been linked to worsening of memory function.  There is always a concern of gradual progression of memory problems. If this is the case, then we may need to adjust level of care according to patient needs. Support, both to the patient and caregiver, should then be put into place.

## 2019-01-30 NOTE — Progress Notes (Signed)
NEUROLOGY FOLLOW UP OFFICE NOTE  Dustin [name] [id number] 1934-09-04  HISTORY OF PRESENT ILLNESS: I had the pleasure of seeing Dustin [name] in follow-up in the neurology clinic on 01/30/2019. He is accompanied by his wife and daughter who help supplement the history today. The patient was last seen in May 2018 for worsening memory. He underwent Neuropsychological testing in September 2018 which showed mild dementia, most likely due to Alzheimer's disease. MRI brain in July 2018 did not show any acute changes, there was mild diffuse atrophy, minimal chronic microvascular disease. Neuropsychological testing indicated that despite denying depression or anxiety during the interview, his responses on self-report questionnaires suggest significant levels of both, and this may be exacerbating underlying cognitive dysfunction. His daughter reports that he has chronic OCD, which is the cause of a lot of his problems, he worries constantly over everything. He states he is a nervous type of person. Family reports that if there is a rock on the driveway, he has to go and remove it. They state this is how he has always been, but worse recently. He is not being prescribed Xanax anymore, he reports taking Buspar and Lexapro but is unsure. Medications are in [facility]. His wife states he gets mad at her all the time. He has hearing difficulties, she repeats herself louder and he gets upset that she hollers at him. He has not noticed much difference with his memory, but his wife shakes her head. She does note he does very well with finances and medications. He is independent with dressing and bathing. He has not been driving and awaits DMV Medical Advisory Board decision regarding driving. On his Neuropsychological testing, it was noted he performed poorly on a cognitive test highly correlated with driving ability, and his memory loss puts him at risk for getting lost. He had a driving evaluation done recently with  restrictions recommended. He denies any headaches, dizziness, vision changes, focal numbness/tingling/weakness, no falls. Some nights he does not sleep good, and sleeps a lot during the day. Wife reports snoring.   History on Initial Assessment 04/14/2015: This is an 85 yo RH man with a history of hypertension, hyperlipidemia, chronic pain, thyroiditis, with worsening memory loss. It was initially unclear why he presented because he and his son report that his memory is fine, however toward the end of the visit, he stated he needed a note for the DMV to return to driving. He had been evaluated by neurologist Dr. Krista Blue in 2013 for back pain and right leg radiculopathy, but also "checked my memory." Records unavailable for review, he was started on Aricept at that time. Namenda was added by his PCP 2 years ago. He feels his memory is fine. He denies any missed bill payments, and states he is the one who pays for his son's bills. He forgets where he puts things, but eventually remembers. He does note that he does not take his medications regularly, "when I think about it." He has a pill box but sometimes forgets still. His son has noticed that he repeats himself but it's "not bad." There is one note received from his PCP where his wife had reported sundowning, hallucinations. When asked about the DMV evaluation, he reports that he was pulled over 3 months ago because he did not stop where he was supposed to. He then underwent a driver's evaluation at the [facility] and was turned down, saying he drove too slow. He then repeated the test 2 weeks later and tells me he  was told he is "the best driver." However, it appears his driver's license was not reinstated and he is asking for a letter for the [facility] for medical clearance.   He has headaches once a week or so, lasting 2-3 days, with soreness and achy feeling in the frontal and temporal regions, no associated nausea, vomiting, photo/phonophobia. He has some dizziness  described as unsteadiness when walking, no falls. He has burning in the bottom of both feet, feeling like he is walking barefoot on hot rocks of coal. He denies any diplopia, dysarthria, dysphagia, neck pain, bowel/bladder dysfunction. No family history of dementia.   I personally reviewed head CT done 08/2014 which showed mild diffuse cerebral and cerebellar atrophy, no acute changes.   PAST MEDICAL HISTORY: Past Medical History:  Diagnosis Date  . Alzheimer disease ([place])   . Anxiety   . Arthritis   . Atrophic gastritis    a. By EGD 02/2013.  . Carotid artery disease ([place])    a. mild-mod plaque, <50% stenosis bilat by duplex 2018.  [name] Coronary atherosclerosis of native coronary artery    a. Multivessel s/p CABG 1996. b. s/p DES x 2 SVG to PDA 8/12 with distal disease managed medically.  . DDD (degenerative disc disease)    Chronic back pain  . Enlarged prostate   . Essential hypertension, benign   . Hematuria   . Hypothyroidism   . LBBB (left bundle branch block)   . MI (myocardial infarction) ([place])   . Mixed hyperlipidemia   . OA (osteoarthritis)   . OSA (obstructive sleep apnea)   . Sinus bradycardia    a. Aricept and BB discontinued due to this.    MEDICATIONS: Current Outpatient Medications on File Prior to Visit  Medication Sig Dispense Refill  . albuterol (PROVENTIL) (2.5 MG/3ML) 0.083% nebulizer solution Take 3 mLs (2.5 mg total) by nebulization every 6 (six) hours as needed for wheezing or shortness of breath. Patient will also need the nebulizer machine with this prescription. 75 mL 3  . ALPRAZolam (XANAX) 1 MG tablet Take 1 tablet (1 mg total) by mouth 2 (two) times daily. For anxiety/sleep (Patient taking differently: Take 1.5 mg by mouth 2 (two) times daily as needed. For anxiety/sleep) 30 tablet 0  . busPIRone (BUSPAR) 10 MG tablet Take 10 mg by mouth 2 (two) times daily.     . clopidogrel (PLAVIX) 75 MG tablet Take 1 tablet (75 mg total) by mouth daily.    [name]  docusate sodium (COLACE) 250 MG capsule Take 250 mg by mouth daily.    [name] dutasteride (AVODART) 0.5 MG capsule Take 0.5 mg by mouth daily.    [name] escitalopram (LEXAPRO) 10 MG tablet Take 10 mg by mouth daily.    . furosemide (LASIX) 20 MG tablet Take 20 mg by mouth 2 (two) times daily.     [name] gabapentin (NEURONTIN) 300 MG capsule Take 300 mg by mouth 2 (two) times daily.    . isosorbide mononitrate (IMDUR) 30 MG 24 hr tablet Take 60 mg ( 2 tablets)  Am, and 30 mg ( 1 tablet) in the afternoon (Patient taking differently: Take 30-60 mg by mouth See admin instructions. Take 60 mg ( 2 tablets)  Am, and 30 mg ( 1 tablet) in the afternoon) 270 tablet 3  . levothyroxine (SYNTHROID, LEVOTHROID) 50 MCG tablet Take 50 mcg by mouth daily.    . memantine (NAMENDA) 10 MG tablet Take 1 tablet by mouth 2 (two) times daily.    [name]  nitroGLYCERIN (NITROSTAT) 0.4 MG SL tablet PLACE ONE TABLET UNDER TONGUE EVERY 5 MIN UP TO 3 DOSES AS NEEDED FORCHEST PAIN. 25 tablet 3  . oxycodone (ROXICODONE) 30 MG immediate release tablet Take 30 mg by mouth every 4 (four) hours as needed for pain. Take 1 tablet by mouth every 4 to 6 hours as needed for pain.  Max 8 /24 hours.    . pantoprazole (PROTONIX) 40 MG tablet Take 1 tablet (40 mg total) by mouth daily. 30 tablet 1  . pilocarpine (SALAGEN) 5 MG tablet Take 5 mg by mouth 2 (two) times daily.    . potassium chloride SA (K-DUR,KLOR-CON) 20 MEQ tablet Take 20 mEq by mouth daily. When taking lasix    . pravastatin (PRAVACHOL) 40 MG tablet TAKE (1) TABLET BY MOUTH AT BEDTIME. 30 tablet 1  . Sennosides 25 MG TABS Take 1 tablet by mouth every 3 (three) days. To help with opioid constipation    . tamsulosin (FLOMAX) 0.4 MG CAPS capsule Take 1 capsule by mouth daily.     No current facility-administered medications on file prior to visit.     ALLERGIES: No Known Allergies  FAMILY HISTORY: Family History  Problem Relation Age of Onset  . Heart disease Unknown   . Heart attack Mother    . Colon cancer Neg Hx     SOCIAL HISTORY: Social History   Socioeconomic History  . Marital status: Married    Spouse name: Not on file  . Number of children: 3  . Years of education: Not on file  . Highest education level: Not on file  Occupational History  . Occupation: retired    Fish farm manager: RETIRED  Social Needs  . Financial resource strain: Not on file  . Food insecurity:    Worry: Not on file    Inability: Not on file  . Transportation needs:    Medical: Not on file    Non-medical: Not on file  Tobacco Use  . Smoking status: Former Smoker    Packs/day: 2.00    Years: 40.00    Pack years: 80.00    Types: Cigarettes    Last attempt to quit: 12/27/1994    Years since quitting: 24.1  . Smokeless tobacco: Never Used  Substance and Sexual Activity  . Alcohol use: No    Alcohol/week: 0.0 standard drinks  . Drug use: No  . Sexual activity: Not Currently  Lifestyle  . Physical activity:    Days per week: Not on file    Minutes per session: Not on file  . Stress: Not on file  Relationships  . Social connections:    Talks on phone: Not on file    Gets together: Not on file    Attends religious service: Not on file    Active member of club or organization: Not on file    Attends meetings of clubs or organizations: Not on file    Relationship status: Not on file  . Intimate partner violence:    Fear of current or ex partner: Not on file    Emotionally abused: Not on file    Physically abused: Not on file    Forced sexual activity: Not on file  Other Topics Concern  . Not on file  Social History Narrative   Lives w/ ailing wife.   Son brings meals 3x per week    REVIEW OF SYSTEMS: Constitutional: No fevers, chills, or sweats, no generalized fatigue, change in appetite Eyes: No visual changes, double  vision, eye pain Ear, nose and throat: No hearing loss, ear pain, nasal congestion, sore throat Cardiovascular: No chest pain, palpitations Respiratory:  No  shortness of breath at rest or with exertion, wheezes GastrointestinaI: No nausea, vomiting, diarrhea, abdominal pain, fecal incontinence Genitourinary:  No dysuria, urinary retention or frequency Musculoskeletal:  No neck pain, back pain Integumentary: No rash, pruritus, skin lesions Neurological: as above Psychiatric: No depression, insomnia, +anxiety Endocrine: No palpitations, fatigue, diaphoresis, mood swings, change in appetite, change in weight, increased thirst Hematologic/Lymphatic:  No anemia, purpura, petechiae. Allergic/Immunologic: no itchy/runny eyes, nasal congestion, recent allergic reactions, rashes  PHYSICAL EXAM: Vitals:   01/30/19 1132  BP: 134/68  Pulse: 72  SpO2: 92%   General: No acute distress Head:  Normocephalic/atraumatic, no temporal or occipital tenderness or ropiness Neck: supple, no paraspinal tenderness, full range of motion Heart:  Regular rate and rhythm Lungs:  Clear to auscultation bilaterally Back: No paraspinal tenderness Skin/Extremities: No rash, no edema Neurological Exam: alert and oriented to person, place, and time. No aphasia or dysarthria. Fund of knowledge is appropriate.  Remote memory intact.  Attention and concentration are normal. Able to name objects and repeat phrases. CDT 5/5 MMSE - Mini Mental State Exam 01/30/2019 05/02/2017 04/14/2015  Orientation to time 4 5 4   Orientation to Place 5 5 5   Registration 3 3 3   Attention/ Calculation 5 3 4   Recall 0 2 3  Language- name 2 objects 2 2 2   Language- repeat 1 1 1   Language- follow 3 step command 3 3 3   Language- read & follow direction 1 1 1   Write a sentence 1 1 1   Copy design 1 1 1   Total score 26 27 28    Cranial nerves: Pupils equal, round, reactive to light.  Extraocular movements intact with no nystagmus. Visual fields full. Facial sensation intact. No facial asymmetry. Tongue, uvula, palate midline.  Motor: Bulk and tone normal, muscle strength 5/5 throughout with no pronator  drift.  Sensation to light touch, temperature and vibration intact.  No extinction to double simultaneous stimulation. Finger to nose testing intact.  Gait narrow-based and steady, able to tandem walk adequately.  Romberg negative.  IMPRESSION: This is an 85 yo RH man with a history of hypertension, hyperlipidemia, chronic pain, thyroiditis, with worsening memory. Neuropsychological testing in 2018 indicated Alzheimer's disease, mild. There was also note of significant levels of anxiety and depression on self-reporting questionnaire. His MMSE today is 26/30 (27/30 in May 2018, 28/30 in April 2016), he is on Memantine 10mg  BID without side effects. He is quite anxious about returning to driving, he is awaiting [facility] Medical Advisory Board decision. We discussed concerns regarding anxiety/OCD, he will be referred to Geriatric Psychiatry for evaluation and treatment. We again discussed the importance of control of vascular risk factors, physical exercise, and brain stimulation exercises for brain health. Follow-up in 8-12 months, they know to call for any changes.   Thank you for allowing me to participate in his care.  Please do not hesitate to call for any questions or concerns.  The duration of this appointment visit was 30 minutes of face-to-face time with the patient.  Greater than 50% of this time was spent in counseling, explanation of diagnosis, planning of further management, and coordination of care.   Ellouise Newer, M.D.   CC: Dr. Gerarda Fraction

## 2019-01-31 NOTE — Progress Notes (Signed)
Cardiology Office Note  Date: 02/02/2019   ID: Dustin [name], DOB 09/25/1934, MRN [id number]  PCP: [name], MD  Primary Cardiologist: Rozann Lesches, MD   Chief Complaint  Patient presents with  . Coronary Artery Disease    History of Present Illness: Dustin MCGAHA is an 85 y.o. male last seen in July 2019.  He is here today with his wife for a routine visit.  He states that his angina symptoms have been somewhat less frequent, still uses nitroglycerin intermittently.  He reports no other change in baseline cardiac medical regimen which is listed below.  His wife indicates that he continues to have trouble with progressive memory loss and dementia.  Past Medical History:  Diagnosis Date  . Alzheimer disease ([place])   . Anxiety   . Arthritis   . Atrophic gastritis    a. By EGD 02/2013.  . Carotid artery disease ([place])    a. mild-mod plaque, <50% stenosis bilat by duplex 2018.  [name] Coronary atherosclerosis of native coronary artery    a. Multivessel s/p CABG 1996. b. s/p DES x 2 SVG to PDA 8/12 with distal disease managed medically.  . DDD (degenerative disc disease)    Chronic back pain  . Enlarged prostate   . Essential hypertension, benign   . Hematuria   . Hypothyroidism   . LBBB (left bundle branch block)   . MI (myocardial infarction) ([place])   . Mixed hyperlipidemia   . OA (osteoarthritis)   . OSA (obstructive sleep apnea)   . Sinus bradycardia    a. Aricept and BB discontinued due to this.    Past Surgical History:  Procedure Laterality Date  . COLONOSCOPY  08/03/2004   Jenkins-numerous large diverticula in the descending, transverse, descending, and sigmoid colon. Otherwise normal exam.  . COLONOSCOPY  07/12/2012   RMR: External hemorrhoidal tag; multiple rectal and colonic polyps removed and/or treated as described above. Pancolonic diverticulosis. Bx-tubular adenomas, rectal hyperplastic polyp. next colonoscopy in 06/2015.  [name] COLONOSCOPY N/A 06/22/2016   Procedure: COLONOSCOPY;  Surgeon: Aviva Signs, MD;  [facility];  Service: Gastroenterology;  Laterality: N/A;  730  . CORONARY ANGIOPLASTY WITH STENT PLACEMENT    . CORONARY ARTERY BYPASS GRAFT  1996   LIMA to LAD, SVG to D2, SVG to PDA, SVG to OM1 and OM2  . ESOPHAGOGASTRODUODENOSCOPY N/A 03/16/2013   Procedure: ESOPHAGOGASTRODUODENOSCOPY (EGD);  Surgeon: Daneil Dolin, MD;  [facility];  Service: Endoscopy;  Laterality: N/A;  12:00-moved to [place] notified pt  . ESOPHAGOGASTRODUODENOSCOPY N/A 03/06/2013   Procedure: ESOPHAGOGASTRODUODENOSCOPY (EGD);  Surgeon: Daneil Dolin, MD;  [facility];  Service: Endoscopy;  Laterality: N/A;  . HERNIA REPAIR      Current Outpatient Medications  Medication Sig Dispense Refill  . albuterol (PROVENTIL) (2.5 MG/3ML) 0.083% nebulizer solution Take 3 mLs (2.5 mg total) by nebulization every 6 (six) hours as needed for wheezing or shortness of breath. Patient will also need the nebulizer machine with this prescription. 75 mL 3  . busPIRone (BUSPAR) 10 MG tablet Take 10 mg by mouth 2 (two) times daily.     . ciprofloxacin (CIPRO) 500 MG tablet     . clopidogrel (PLAVIX) 75 MG tablet Take 1 tablet (75 mg total) by mouth daily.    [name] docusate sodium (COLACE) 250 MG capsule Take 250 mg by mouth daily.    [name] dutasteride (AVODART) 0.5 MG capsule Take 0.5 mg by mouth daily.    [name]  escitalopram (LEXAPRO) 10 MG tablet Take 10 mg by mouth daily.    . furosemide (LASIX) 20 MG tablet Take 20 mg by mouth 2 (two) times daily.     [name] gabapentin (NEURONTIN) 300 MG capsule Take 300 mg by mouth 2 (two) times daily.    . isosorbide mononitrate (IMDUR) 30 MG 24 hr tablet Take 60 mg ( 2 tablets)  Am, and 30 mg ( 1 tablet) in the afternoon (Patient taking differently: Take 30-60 mg by mouth See admin instructions. Take 60 mg ( 2 tablets)  Am, and 30 mg ( 1 tablet) in the afternoon) 270 tablet 3  . levothyroxine (SYNTHROID, LEVOTHROID) 50 MCG  tablet Take 50 mcg by mouth daily.    . memantine (NAMENDA) 10 MG tablet Take 1 tablet by mouth 2 (two) times daily.    . nitroGLYCERIN (NITROSTAT) 0.4 MG SL tablet Take as directed 25 tablet 3  . oxycodone (ROXICODONE) 30 MG immediate release tablet Take 30 mg by mouth every 4 (four) hours as needed for pain. Take 1 tablet by mouth every 4 to 6 hours as needed for pain.  Max 8 /24 hours.    . pantoprazole (PROTONIX) 40 MG tablet Take 1 tablet (40 mg total) by mouth daily. 30 tablet 1  . pilocarpine (SALAGEN) 5 MG tablet Take 5 mg by mouth 2 (two) times daily.    . potassium chloride SA (K-DUR,KLOR-CON) 20 MEQ tablet Take 20 mEq by mouth daily. When taking lasix    . pravastatin (PRAVACHOL) 40 MG tablet TAKE (1) TABLET BY MOUTH AT BEDTIME. 30 tablet 1  . Sennosides 25 MG TABS Take 1 tablet by mouth every 3 (three) days. To help with opioid constipation    . tamsulosin (FLOMAX) 0.4 MG CAPS capsule Take 1 capsule by mouth daily.     No current facility-administered medications for this visit.    Allergies:  Patient has no known allergies.   Social History: The patient  reports that he quit smoking about 24 years ago. His smoking use included cigarettes. He has a 80.00 pack-year smoking history. He has never used smokeless tobacco. He reports that he does not drink alcohol or use drugs.   ROS:  Please see the history of present illness. Otherwise, complete review of systems is positive for hearing loss, chronic back pain and leg neuropathy.  All other systems are reviewed and negative.   Physical Exam: VS:  BP 108/64   Pulse 65   Ht 5\' 10"  (1.778 m)   Wt 208 lb (94.3 kg)   SpO2 93%   BMI 29.84 kg/m , BMI Body mass index is 29.84 kg/m.  Wt Readings from Last 3 Encounters:  02/02/19 208 lb (94.3 kg)  01/30/19 207 lb (93.9 kg)  11/08/18 214 lb 11.7 oz (97.4 kg)    General: Elderly male, appears comfortable at rest. HEENT: Conjunctiva and lids normal, oropharynx clear. Neck: Supple, no  elevated JVP or carotid bruits, no thyromegaly. Lungs: Clear to auscultation, nonlabored breathing at rest. Cardiac: Regular rate and rhythm, no S3, soft systolic murmur. Abdomen: Soft, nontender, bowel sounds present. Extremities: No pitting edema, distal pulses 2+. Skin: Warm and dry. Musculoskeletal: No kyphosis. Neuropsychiatric: Alert and oriented x3, affect grossly appropriate.  ECG: I personally reviewed the tracing from 11/07/2018 which showed sinus rhythm with PAC and IVCD.  Recent Labwork: 11/07/2018: BUN 17; Creatinine, Ser 1.06; Potassium 3.7; Sodium 135 11/08/2018: Hemoglobin 12.4; Platelets 228     Component Value Date/Time   CHOL  127 06/29/2014 0308   TRIG 143 06/29/2014 0308   HDL 40 06/29/2014 0308   CHOLHDL 3.2 06/29/2014 0308   VLDL 29 06/29/2014 0308   LDLCALC 58 06/29/2014 0308    Other Studies Reviewed Today:  Echocardiogram 11/08/2018: Study Conclusions  - Left ventricle: The cavity size was normal. Wall thickness was   increased in a pattern of mild LVH. Systolic function was normal.   The estimated ejection fraction was in the range of 55% to 60%.   Wall motion was normal; there were no regional wall motion   abnormalities. Features are consistent with a pseudonormal left   ventricular filling pattern, with concomitant abnormal relaxation   and increased filling pressure (grade 2 diastolic dysfunction).   Doppler parameters are consistent with high ventricular filling   pressure. - Aortic valve: Moderately to severely calcified annulus.   Trileaflet. - Mitral valve: Mildly calcified annulus. Normal thickness leaflets   . There was mild regurgitation. - Left atrium: The atrium was mildly dilated. - Right atrium: The atrium was mildly dilated. - Atrial septum: No defect or patent foramen ovale was identified. - Tricuspid valve: There was mild regurgitation.  Lexiscan Myoview 12/28/2016:  Inferior defect that shows partial improvement consistent  with scar/soft tissue attenuation and periinfarct ischemia  LVEF 46%  Intermediate risk study  Assessment and Plan:  1.  Multivessel CAD status post CABG with subsequent DES intervention to the SVG to PDA in 2012.  We will continue with observational medical therapy, overall stable angina symptoms.  Refill provided for nitroglycerin.  2.  Essential hypertension, blood pressure is well controlled today on current regimen.  3.  Mixed hyperlipidemia on Pravachol.  He continues to follow with Dr. Gerarda Fraction.  4.  Nonobstructive carotid artery disease, asymptomatic.  Continue antiplatelet regimen and statin.  Current medicines were reviewed with the patient today.   Disposition: Follow-up in 6 months.  Signed, Satira Sark, MD, [facility] 02/02/2019 2:34 PM    [place] [facility] [address], [place], [place] [address] Phone: [phone]; Fax: [phone]

## 2019-02-02 ENCOUNTER — Encounter: Payer: Self-pay | Admitting: Cardiology

## 2019-02-02 ENCOUNTER — Ambulatory Visit: Payer: PPO | Admitting: Cardiology

## 2019-02-02 VITALS — BP 108/64 | HR 65 | Ht 70.0 in | Wt 208.0 lb

## 2019-02-02 DIAGNOSIS — I10 Essential (primary) hypertension: Secondary | ICD-10-CM

## 2019-02-02 DIAGNOSIS — I6523 Occlusion and stenosis of bilateral carotid arteries: Secondary | ICD-10-CM

## 2019-02-02 DIAGNOSIS — E782 Mixed hyperlipidemia: Secondary | ICD-10-CM

## 2019-02-02 DIAGNOSIS — I25119 Atherosclerotic heart disease of native coronary artery with unspecified angina pectoris: Secondary | ICD-10-CM | POA: Diagnosis not present

## 2019-02-02 MED ORDER — NITROGLYCERIN 0.4 MG SL SUBL: SUBLINGUAL_TABLET | SUBLINGUAL | 3 refills | Status: DC

## 2019-02-02 NOTE — Patient Instructions (Signed)
Medication Instructions:  Your physician recommends that you continue on your current medications as directed. Please refer to the Current Medication list given to you today.  If you need a refill on your cardiac medications before your next appointment, please call your pharmacy.   Lab work: None today If you have labs (blood work) drawn today and your tests are completely normal, you will receive your results only by: . MyChart Message (if you have MyChart) OR . A paper copy in the mail If you have any lab test that is abnormal or we need to change your treatment, we will call you to review the results.  Testing/Procedures: None today  Follow-Up: At [facility], you and your health needs are our priority.  As part of our continuing mission to provide you with exceptional heart care, we have created designated Provider Care Teams.  These Care Teams include your primary Cardiologist (physician) and Advanced Practice Providers (APPs -  Physician Assistants and Nurse Practitioners) who all work together to provide you with the care you need, when you need it. You will need a follow up appointment in 6 months.  Please call our office 2 months in advance to schedule this appointment.  You may see Samuel McDowell, MD or one of the following Advanced Practice Providers on your designated Care Team:   Brittany Strader, PA-C ([place] Office) . Michele Lenze, PA-C ([place] Office)   Any Other Special Instructions Will Be Listed Below (If Applicable). none   

## 2019-02-05 DIAGNOSIS — M5417 Radiculopathy, lumbosacral region: Secondary | ICD-10-CM | POA: Diagnosis not present

## 2019-02-05 DIAGNOSIS — M1991 Primary osteoarthritis, unspecified site: Secondary | ICD-10-CM | POA: Diagnosis not present

## 2019-02-05 DIAGNOSIS — Z6829 Body mass index (BMI) 29.0-29.9, adult: Secondary | ICD-10-CM | POA: Diagnosis not present

## 2019-02-05 DIAGNOSIS — G894 Chronic pain syndrome: Secondary | ICD-10-CM | POA: Diagnosis not present

## 2019-02-05 DIAGNOSIS — F1120 Opioid dependence, uncomplicated: Secondary | ICD-10-CM | POA: Diagnosis not present

## 2019-02-05 DIAGNOSIS — F419 Anxiety disorder, unspecified: Secondary | ICD-10-CM | POA: Diagnosis not present

## 2019-02-05 DIAGNOSIS — E063 Autoimmune thyroiditis: Secondary | ICD-10-CM | POA: Diagnosis not present

## 2019-02-05 DIAGNOSIS — E663 Overweight: Secondary | ICD-10-CM | POA: Diagnosis not present

## 2019-02-07 DIAGNOSIS — J9620 Acute and chronic respiratory failure, unspecified whether with hypoxia or hypercapnia: Secondary | ICD-10-CM | POA: Diagnosis not present

## 2019-03-08 DIAGNOSIS — J9620 Acute and chronic respiratory failure, unspecified whether with hypoxia or hypercapnia: Secondary | ICD-10-CM | POA: Diagnosis not present

## 2019-03-12 DIAGNOSIS — L739 Follicular disorder, unspecified: Secondary | ICD-10-CM | POA: Diagnosis not present

## 2019-03-12 DIAGNOSIS — L738 Other specified follicular disorders: Secondary | ICD-10-CM | POA: Diagnosis not present

## 2019-03-14 DIAGNOSIS — F419 Anxiety disorder, unspecified: Secondary | ICD-10-CM | POA: Diagnosis not present

## 2019-03-14 DIAGNOSIS — M1991 Primary osteoarthritis, unspecified site: Secondary | ICD-10-CM | POA: Diagnosis not present

## 2019-03-14 DIAGNOSIS — I10 Essential (primary) hypertension: Secondary | ICD-10-CM | POA: Diagnosis not present

## 2019-03-14 DIAGNOSIS — G894 Chronic pain syndrome: Secondary | ICD-10-CM | POA: Diagnosis not present

## 2019-03-14 DIAGNOSIS — Z6829 Body mass index (BMI) 29.0-29.9, adult: Secondary | ICD-10-CM | POA: Diagnosis not present

## 2019-03-21 DIAGNOSIS — R42 Dizziness and giddiness: Secondary | ICD-10-CM | POA: Diagnosis not present

## 2019-03-21 DIAGNOSIS — R51 Headache: Secondary | ICD-10-CM | POA: Diagnosis not present

## 2019-03-27 DIAGNOSIS — Z6829 Body mass index (BMI) 29.0-29.9, adult: Secondary | ICD-10-CM | POA: Diagnosis not present

## 2019-03-27 DIAGNOSIS — I200 Unstable angina: Secondary | ICD-10-CM | POA: Diagnosis not present

## 2019-03-27 DIAGNOSIS — E063 Autoimmune thyroiditis: Secondary | ICD-10-CM | POA: Diagnosis not present

## 2019-03-27 DIAGNOSIS — R109 Unspecified abdominal pain: Secondary | ICD-10-CM | POA: Diagnosis not present

## 2019-03-27 DIAGNOSIS — R55 Syncope and collapse: Secondary | ICD-10-CM | POA: Diagnosis not present

## 2019-03-27 DIAGNOSIS — I10 Essential (primary) hypertension: Secondary | ICD-10-CM | POA: Diagnosis not present

## 2019-03-27 DIAGNOSIS — F1120 Opioid dependence, uncomplicated: Secondary | ICD-10-CM | POA: Diagnosis not present

## 2019-04-24 DIAGNOSIS — Z6829 Body mass index (BMI) 29.0-29.9, adult: Secondary | ICD-10-CM | POA: Diagnosis not present

## 2019-04-24 DIAGNOSIS — N418 Other inflammatory diseases of prostate: Secondary | ICD-10-CM | POA: Diagnosis not present

## 2019-04-24 DIAGNOSIS — R300 Dysuria: Secondary | ICD-10-CM | POA: Diagnosis not present

## 2019-04-24 DIAGNOSIS — N390 Urinary tract infection, site not specified: Secondary | ICD-10-CM | POA: Diagnosis not present

## 2019-04-24 DIAGNOSIS — E663 Overweight: Secondary | ICD-10-CM | POA: Diagnosis not present

## 2019-04-24 DIAGNOSIS — J9801 Acute bronchospasm: Secondary | ICD-10-CM | POA: Diagnosis not present

## 2019-05-14 ENCOUNTER — Encounter (INDEPENDENT_AMBULATORY_CARE_PROVIDER_SITE_OTHER): Payer: PPO | Admitting: Ophthalmology

## 2019-05-22 ENCOUNTER — Encounter (INDEPENDENT_AMBULATORY_CARE_PROVIDER_SITE_OTHER): Payer: PPO | Admitting: Ophthalmology

## 2019-05-22 NOTE — Progress Notes (Deleted)
Psychiatric Initial Adult Assessment   Patient Identification: Dustin [name] MRN:  [id number] Date of Evaluation:  05/22/2019 Referral Source: Cameron Sprang, MD Chief Complaint:   Visit Diagnosis: No diagnosis found.  History of Present Illness:   Dustin [name] is a 85 y.o. year old male with a history of mild neurocognitive disorder, r/o alzheimer's disease, multivessel CAD status post CABG with subsequent DES , hypertension. Hyperlipidemia, hypothyroidism, bradycardia,  Who is referred for depression/anxiety.   Per chart review, he was seen by Dr.  Daiva Huge on 08/2017. Diagnosis includes Mild dementia, most likely due to Alzheimer's disease. "This cognitive profile is consistent with medial temporal lobe involvement most commonly seen in Alzheimer's disease. He also demonstrated some aspects of executive dysfunction including difficulty with abstract reasoning and mental flexibility. " "Despite denying depression or anxiety during the clinical interview, his responses on self-report questionnaires suggest significant levels of both, and this may be exacerbating underlying cognitive dysfunction. Additionally, regular use of prescribed pain medication and alprazolam could also worsen underlying cognitive dysfunction.  Vitamin b12, folate, rpr   ------------------------ MRI HEAD WITHOUT CONTRAST 06/2017  COMPARISON:  CT head 04/03/2016, MRI 06/28/2014  FINDINGS: Brain: Progression of atrophy since 2015. Global atrophy without lobar predominance. Negative for hydrocephalus. Negative for acute infarct. Scattered small white matter hyperintensities bilaterally similar to 2015. Small chronic infarct in the right putamen unchanged. Negative for hemorrhage, mass, or fluid collection  IMPRESSION: Mild generalized atrophy with progression since 2015. No acute abnormality. Very mild chronic white matter changes stable since 2015.  Associated Signs/Symptoms: Depression Symptoms:   {DEPRESSION SYMPTOMS:20000} (Hypo) Manic Symptoms:  {BHH MANIC SYMPTOMS:22872} Anxiety Symptoms:  {BHH ANXIETY SYMPTOMS:22873} Psychotic Symptoms:  {BHH PSYCHOTIC SYMPTOMS:22874} PTSD Symptoms: {BHH PTSD SYMPTOMS:22875}  Past Psychiatric History:  Outpatient:  Psychiatry admission:  Previous suicide attempt:  Past trials of medication:  History of violence:   Previous Psychotropic Medications: {YES/NO:21197}  Substance Abuse History in the last 12 months:  {yes no:314532}  Consequences of Substance Abuse: {BHH CONSEQUENCES OF SUBSTANCE ABUSE:22880}  Past Medical History:  Past Medical History:  Diagnosis Date  . Alzheimer disease ([place])   . Anxiety   . Arthritis   . Atrophic gastritis    a. By EGD 02/2013.  . Carotid artery disease ([place])    a. mild-mod plaque, <50% stenosis bilat by duplex 2018.  [name] Coronary atherosclerosis of native coronary artery    a. Multivessel s/p CABG 1996. b. s/p DES x 2 SVG to PDA 8/12 with distal disease managed medically.  . DDD (degenerative disc disease)    Chronic back pain  . Enlarged prostate   . Essential hypertension, benign   . Hematuria   . Hypothyroidism   . LBBB (left bundle branch block)   . MI (myocardial infarction) ([place])   . Mixed hyperlipidemia   . OA (osteoarthritis)   . OSA (obstructive sleep apnea)   . Sinus bradycardia    a. Aricept and BB discontinued due to this.    Past Surgical History:  Procedure Laterality Date  . COLONOSCOPY  08/03/2004   Jenkins-numerous large diverticula in the descending, transverse, descending, and sigmoid colon. Otherwise normal exam.  . COLONOSCOPY  07/12/2012   RMR: External hemorrhoidal tag; multiple rectal and colonic polyps removed and/or treated as described above. Pancolonic diverticulosis. Bx-tubular adenomas, rectal hyperplastic polyp. next colonoscopy in 06/2015.  [name] COLONOSCOPY N/A 06/22/2016   Procedure: COLONOSCOPY;  Surgeon: Aviva Signs, MD;  [facility];  Service:  Gastroenterology;  Laterality: N/A;  730  . CORONARY ANGIOPLASTY WITH STENT PLACEMENT    . CORONARY ARTERY BYPASS GRAFT  1996   LIMA to LAD, SVG to D2, SVG to PDA, SVG to OM1 and OM2  . ESOPHAGOGASTRODUODENOSCOPY N/A 03/16/2013   Procedure: ESOPHAGOGASTRODUODENOSCOPY (EGD);  Surgeon: Daneil Dolin, MD;  [facility];  Service: Endoscopy;  Laterality: N/A;  12:00-moved to [place] notified pt  . ESOPHAGOGASTRODUODENOSCOPY N/A 03/06/2013   Procedure: ESOPHAGOGASTRODUODENOSCOPY (EGD);  Surgeon: Daneil Dolin, MD;  [facility];  Service: Endoscopy;  Laterality: N/A;  . HERNIA REPAIR      Family Psychiatric History: ***  Family History:  Family History  Problem Relation Age of Onset  . Heart disease Other   . Heart attack Mother   . Colon cancer Neg Hx     Social History:   Social History   Socioeconomic History  . Marital status: Married    Spouse name: Not on file  . Number of children: 3  . Years of education: Not on file  . Highest education level: Not on file  Occupational History  . Occupation: retired    Fish farm manager: RETIRED  Social Needs  . Financial resource strain: Not on file  . Food insecurity:    Worry: Not on file    Inability: Not on file  . Transportation needs:    Medical: Not on file    Non-medical: Not on file  Tobacco Use  . Smoking status: Former Smoker    Packs/day: 2.00    Years: 40.00    Pack years: 80.00    Types: Cigarettes    Last attempt to quit: 12/27/1994    Years since quitting: 24.4  . Smokeless tobacco: Never Used  Substance and Sexual Activity  . Alcohol use: No    Alcohol/week: 0.0 standard drinks  . Drug use: No  . Sexual activity: Not Currently  Lifestyle  . Physical activity:    Days per week: Not on file    Minutes per session: Not on file  . Stress: Not on file  Relationships  . Social connections:    Talks on phone: Not on file    Gets together: Not on file    Attends religious service: Not on  file    Active member of club or organization: Not on file    Attends meetings of clubs or organizations: Not on file    Relationship status: Not on file  Other Topics Concern  . Not on file  Social History Narrative   Lives w/ ailing wife.   Son brings meals 3x per week    Additional Social History: ***  Allergies:  No Known Allergies  Metabolic Disorder Labs: Lab Results  Component Value Date   HGBA1C 5.7 (H) 06/29/2014   MPG 117 (H) 06/29/2014   MPG 120 (H) 06/28/2014   No results found for: PROLACTIN Lab Results  Component Value Date   CHOL 127 06/29/2014   TRIG 143 06/29/2014   HDL 40 06/29/2014   CHOLHDL 3.2 06/29/2014   VLDL 29 06/29/2014   LDLCALC 58 06/29/2014   LDLCALC  02/13/2011    66        Total Cholesterol/HDL:CHD Risk Coronary Heart Disease Risk Table                     Men   Women  1/2 Average Risk   3.4   3.3  Average Risk       5.0  4.4  2 X Average Risk   9.6   7.1  3 X Average Risk  23.4   11.0        Use the calculated Patient Ratio above and the CHD Risk Table to determine the patient's CHD Risk.        ATP III CLASSIFICATION (LDL):  <100     mg/dL   Optimal  100-129  mg/dL   Near or Above                    Optimal  130-159  mg/dL   Borderline  160-189  mg/dL   High  >190     mg/dL   Very High   Lab Results  Component Value Date   TSH 3.893 02/12/2017    Therapeutic Level Labs: No results found for: LITHIUM No results found for: CBMZ No results found for: VALPROATE  Current Medications: Current Outpatient Medications  Medication Sig Dispense Refill  . albuterol (PROVENTIL) (2.5 MG/3ML) 0.083% nebulizer solution Take 3 mLs (2.5 mg total) by nebulization every 6 (six) hours as needed for wheezing or shortness of breath. Patient will also need the nebulizer machine with this prescription. 75 mL 3  . busPIRone (BUSPAR) 10 MG tablet Take 10 mg by mouth 2 (two) times daily.     . ciprofloxacin (CIPRO) 500 MG tablet     .  clopidogrel (PLAVIX) 75 MG tablet Take 1 tablet (75 mg total) by mouth daily.    [name] docusate sodium (COLACE) 250 MG capsule Take 250 mg by mouth daily.    [name] dutasteride (AVODART) 0.5 MG capsule Take 0.5 mg by mouth daily.    [name] escitalopram (LEXAPRO) 10 MG tablet Take 10 mg by mouth daily.    . furosemide (LASIX) 20 MG tablet Take 20 mg by mouth 2 (two) times daily.     [name] gabapentin (NEURONTIN) 300 MG capsule Take 300 mg by mouth 2 (two) times daily.    . isosorbide mononitrate (IMDUR) 30 MG 24 hr tablet Take 60 mg ( 2 tablets)  Am, and 30 mg ( 1 tablet) in the afternoon (Patient taking differently: Take 30-60 mg by mouth See admin instructions. Take 60 mg ( 2 tablets)  Am, and 30 mg ( 1 tablet) in the afternoon) 270 tablet 3  . levothyroxine (SYNTHROID, LEVOTHROID) 50 MCG tablet Take 50 mcg by mouth daily.    . memantine (NAMENDA) 10 MG tablet Take 1 tablet by mouth 2 (two) times daily.    . nitroGLYCERIN (NITROSTAT) 0.4 MG SL tablet Take as directed 25 tablet 3  . oxycodone (ROXICODONE) 30 MG immediate release tablet Take 30 mg by mouth every 4 (four) hours as needed for pain. Take 1 tablet by mouth every 4 to 6 hours as needed for pain.  Max 8 /24 hours.    . pantoprazole (PROTONIX) 40 MG tablet Take 1 tablet (40 mg total) by mouth daily. 30 tablet 1  . pilocarpine (SALAGEN) 5 MG tablet Take 5 mg by mouth 2 (two) times daily.    . potassium chloride SA (K-DUR,KLOR-CON) 20 MEQ tablet Take 20 mEq by mouth daily. When taking lasix    . pravastatin (PRAVACHOL) 40 MG tablet TAKE (1) TABLET BY MOUTH AT BEDTIME. 30 tablet 1  . Sennosides 25 MG TABS Take 1 tablet by mouth every 3 (three) days. To help with opioid constipation    . tamsulosin (FLOMAX) 0.4 MG CAPS capsule Take 1 capsule by mouth daily.  No current facility-administered medications for this visit.     Musculoskeletal: Strength & Muscle Tone: N/A Gait & Station: N/A Patient leans: N/A  Psychiatric Specialty Exam: ROS  There were  no vitals taken for this visit.There is no height or weight on file to calculate BMI.  General Appearance: {Appearance:22683}  Eye Contact:  {BHH EYE CONTACT:22684}  Speech:  Clear and Coherent  Volume:  Normal  Mood:  {BHH MOOD:22306}  Affect:  {Affect (PAA):22687}  Thought Process:  Coherent  Orientation:  Full (Time, Place, and Person)  Thought Content:  Logical  Suicidal Thoughts:  {ST/HT (PAA):22692}  Homicidal Thoughts:  {ST/HT (PAA):22692}  Memory:  {BHH MEMORY:22881}  Judgement:  {Judgement (PAA):22694}  Insight:  {Insight (PAA):22695}  Psychomotor Activity:  Normal  Concentration:  Concentration: Fair and Attention Span: Fair  Recall:  {BHH GOOD/FAIR/POOR:22877}  Fund of Knowledge:{BHH GOOD/FAIR/POOR:22877}  Language: {BHH GOOD/FAIR/POOR:22877}  Akathisia:  {BHH YES OR NO:22294}  Handed:  Right  AIMS (if indicated):  not done  Assets:  Social Support  ADL's:  Intact  Cognition: {chl bhh cognition:304700322}  Sleep:  {BHH GOOD/FAIR/POOR:22877}   Screenings: Mini-Mental     Office Visit from 01/30/2019 in [facility] Office Visit from 05/02/2017 in [facility] from 04/14/2015 in [facility]  Total Score (max 30 points )  26  27  28     PHQ2-9     Patient Outreach Telephone from 09/25/2018 in [place]  PHQ-2 Total Score  0      Assessment and Plan:    Plan  The patient demonstrates the following risk factors for suicide: Chronic risk factors for suicide include: {Chronic Risk Factors for TXHFSFS:23953202}. Acute risk factors for suicide include: {Acute Risk Factors for BXIDHWY:61683729}. Protective factors for this patient include: {Protective Factors for Suicide MSXJ:15520802}. Considering these factors, the overall suicide risk at this point appears to be {Desc; low/moderate/high:110033}. Patient {ACTION; IS/IS MVV:61224497} appropriate for outpatient follow up.    Norman Clay,  MD 5/26/202012:46 PM

## 2019-05-28 ENCOUNTER — Ambulatory Visit (HOSPITAL_COMMUNITY): Payer: PPO | Admitting: Psychiatry

## 2019-06-19 DIAGNOSIS — G894 Chronic pain syndrome: Secondary | ICD-10-CM | POA: Diagnosis not present

## 2019-06-19 DIAGNOSIS — Z6829 Body mass index (BMI) 29.0-29.9, adult: Secondary | ICD-10-CM | POA: Diagnosis not present

## 2019-06-19 DIAGNOSIS — E663 Overweight: Secondary | ICD-10-CM | POA: Diagnosis not present

## 2019-06-20 ENCOUNTER — Other Ambulatory Visit: Payer: Self-pay

## 2019-06-22 ENCOUNTER — Ambulatory Visit: Payer: PPO | Admitting: Neurology

## 2019-06-25 ENCOUNTER — Encounter (INDEPENDENT_AMBULATORY_CARE_PROVIDER_SITE_OTHER): Payer: PPO | Admitting: Ophthalmology

## 2019-06-25 ENCOUNTER — Other Ambulatory Visit: Payer: Self-pay

## 2019-06-25 DIAGNOSIS — H35033 Hypertensive retinopathy, bilateral: Secondary | ICD-10-CM

## 2019-06-25 DIAGNOSIS — H35371 Puckering of macula, right eye: Secondary | ICD-10-CM | POA: Diagnosis not present

## 2019-06-25 DIAGNOSIS — I10 Essential (primary) hypertension: Secondary | ICD-10-CM

## 2019-06-25 DIAGNOSIS — H353122 Nonexudative age-related macular degeneration, left eye, intermediate dry stage: Secondary | ICD-10-CM | POA: Diagnosis not present

## 2019-06-25 DIAGNOSIS — H43813 Vitreous degeneration, bilateral: Secondary | ICD-10-CM

## 2019-06-25 DIAGNOSIS — H2512 Age-related nuclear cataract, left eye: Secondary | ICD-10-CM

## 2019-06-25 DIAGNOSIS — H59031 Cystoid macular edema following cataract surgery, right eye: Secondary | ICD-10-CM

## 2019-07-13 DIAGNOSIS — J449 Chronic obstructive pulmonary disease, unspecified: Secondary | ICD-10-CM | POA: Diagnosis not present

## 2019-07-13 DIAGNOSIS — N401 Enlarged prostate with lower urinary tract symptoms: Secondary | ICD-10-CM | POA: Diagnosis not present

## 2019-07-13 DIAGNOSIS — J42 Unspecified chronic bronchitis: Secondary | ICD-10-CM | POA: Diagnosis not present

## 2019-07-13 DIAGNOSIS — Z6830 Body mass index (BMI) 30.0-30.9, adult: Secondary | ICD-10-CM | POA: Diagnosis not present

## 2019-08-02 ENCOUNTER — Encounter (HOSPITAL_COMMUNITY): Payer: PPO

## 2019-08-02 ENCOUNTER — Other Ambulatory Visit: Payer: Self-pay | Admitting: Cardiology

## 2019-08-02 ENCOUNTER — Encounter: Payer: PPO | Admitting: Vascular Surgery

## 2019-08-06 DIAGNOSIS — Z6830 Body mass index (BMI) 30.0-30.9, adult: Secondary | ICD-10-CM | POA: Diagnosis not present

## 2019-08-06 DIAGNOSIS — M47816 Spondylosis without myelopathy or radiculopathy, lumbar region: Secondary | ICD-10-CM | POA: Diagnosis not present

## 2019-08-06 DIAGNOSIS — G894 Chronic pain syndrome: Secondary | ICD-10-CM | POA: Diagnosis not present

## 2019-08-06 DIAGNOSIS — N400 Enlarged prostate without lower urinary tract symptoms: Secondary | ICD-10-CM | POA: Diagnosis not present

## 2019-08-14 ENCOUNTER — Other Ambulatory Visit: Payer: Self-pay

## 2019-08-14 ENCOUNTER — Encounter: Payer: Self-pay | Admitting: Cardiology

## 2019-08-14 ENCOUNTER — Ambulatory Visit (INDEPENDENT_AMBULATORY_CARE_PROVIDER_SITE_OTHER): Payer: PPO | Admitting: Cardiology

## 2019-08-14 VITALS — BP 152/84 | HR 80 | Temp 97.8°F | Ht 69.0 in | Wt 215.0 lb

## 2019-08-14 DIAGNOSIS — I6523 Occlusion and stenosis of bilateral carotid arteries: Secondary | ICD-10-CM | POA: Diagnosis not present

## 2019-08-14 DIAGNOSIS — E782 Mixed hyperlipidemia: Secondary | ICD-10-CM

## 2019-08-14 DIAGNOSIS — I25119 Atherosclerotic heart disease of native coronary artery with unspecified angina pectoris: Secondary | ICD-10-CM

## 2019-08-14 DIAGNOSIS — I10 Essential (primary) hypertension: Secondary | ICD-10-CM | POA: Diagnosis not present

## 2019-08-14 MED ORDER — CLOPIDOGREL BISULFATE 75 MG PO TABS: 75.0000 mg | ORAL_TABLET | Freq: Every day | ORAL | 3 refills | Status: DC

## 2019-08-14 NOTE — Patient Instructions (Signed)
Medication Instructions: Your physician recommends that you continue on your current medications as directed. Please refer to the Current Medication list given to you today.   Labwork: None today  Procedures/Testing: None today  Follow-Up: 6 months with Dr.McDowell  Any Additional Special Instructions Will Be Listed Below (If Applicable).     If you need a refill on your cardiac medications before your next appointment, please call your pharmacy.      Thank you for choosing [facility] !        

## 2019-08-14 NOTE — Progress Notes (Signed)
Cardiology Office Note  Date: 08/14/2019   ID: Dustin [name], DOB Aug 02, 1934, MRN [id number]  PCP:  [name], MD  Cardiologist:  Rozann Lesches, MD Electrophysiologist:  None   Chief Complaint  Patient presents with  . Cardiac follow-up    History of Present Illness: Dustin [name] is an 85 y.o. male last seen in February.  He is here today with his wife for a follow-up visit.  He does not report any obvious angina symptoms.  He has been staying at home most of the time, watches a lot of television.  He also states that he has been snacking quite a lot and has gained weight.  I reviewed his medications which are outlined below.  Cardiac regimen has included Pravachol, Imdur, Lasix with potassium supplements, and Plavix (although he states that he has been out of this).  He had lab work at [facility] back in January, we are requesting the results.  Past Medical History:  Diagnosis Date  . Alzheimer disease ([place])   . Anxiety   . Arthritis   . Atrophic gastritis    a. By EGD 02/2013.  . Carotid artery disease ([place])    a. mild-mod plaque, <50% stenosis bilat by duplex 2018.  [name] Coronary atherosclerosis of native coronary artery    a. Multivessel s/p CABG 1996. b. s/p DES x 2 SVG to PDA 8/12 with distal disease managed medically.  . DDD (degenerative disc disease)    Chronic back pain  . Enlarged prostate   . Essential hypertension, benign   . Hematuria   . Hypothyroidism   . LBBB (left bundle branch block)   . MI (myocardial infarction) ([place])   . Mixed hyperlipidemia   . OA (osteoarthritis)   . OSA (obstructive sleep apnea)   . Sinus bradycardia    a. Aricept and BB discontinued due to this.    Past Surgical History:  Procedure Laterality Date  . COLONOSCOPY  08/03/2004   Jenkins-numerous large diverticula in the descending, transverse, descending, and sigmoid colon. Otherwise normal exam.  . COLONOSCOPY  07/12/2012   RMR: External hemorrhoidal tag; multiple  rectal and colonic polyps removed and/or treated as described above. Pancolonic diverticulosis. Bx-tubular adenomas, rectal hyperplastic polyp. next colonoscopy in 06/2015.  [name] COLONOSCOPY N/A 06/22/2016   Procedure: COLONOSCOPY;  Surgeon: Aviva Signs, MD;  Location: [facility];  Service: Gastroenterology;  Laterality: N/A;  730  . CORONARY ANGIOPLASTY WITH STENT PLACEMENT    . CORONARY ARTERY BYPASS GRAFT  1996   LIMA to LAD, SVG to D2, SVG to PDA, SVG to OM1 and OM2  . ESOPHAGOGASTRODUODENOSCOPY N/A 03/16/2013   Procedure: ESOPHAGOGASTRODUODENOSCOPY (EGD);  Surgeon: Daneil Dolin, MD;  Location: [facility];  Service: Endoscopy;  Laterality: N/A;  12:00-moved to [place] notified pt  . ESOPHAGOGASTRODUODENOSCOPY N/A 03/06/2013   Procedure: ESOPHAGOGASTRODUODENOSCOPY (EGD);  Surgeon: Daneil Dolin, MD;  Location: [facility];  Service: Endoscopy;  Laterality: N/A;  . HERNIA REPAIR      Current Outpatient Medications  Medication Sig Dispense Refill  . albuterol (PROVENTIL) (2.5 MG/3ML) 0.083% nebulizer solution Take 3 mLs (2.5 mg total) by nebulization every 6 (six) hours as needed for wheezing or shortness of breath. Patient will also need the nebulizer machine with this prescription. 75 mL 3  . busPIRone (BUSPAR) 10 MG tablet Take 10 mg by mouth 2 (two) times daily.     . ciprofloxacin (CIPRO) 500 MG tablet     . clopidogrel (  PLAVIX) 75 MG tablet Take 1 tablet (75 mg total) by mouth daily. 90 tablet 3  . docusate sodium (COLACE) 250 MG capsule Take 250 mg by mouth daily.    [name] dutasteride (AVODART) 0.5 MG capsule Take 0.5 mg by mouth daily.    [name] escitalopram (LEXAPRO) 10 MG tablet Take 10 mg by mouth daily.    . furosemide (LASIX) 20 MG tablet Take 20 mg by mouth 2 (two) times daily.     [name] gabapentin (NEURONTIN) 300 MG capsule Take 300 mg by mouth 2 (two) times daily.    . isosorbide mononitrate (IMDUR) 30 MG 24 hr tablet Take 60 mg ( 2 tablets)  Am, and 30 mg ( 1 tablet) in the  afternoon (Patient taking differently: Take 30-60 mg by mouth See admin instructions. Take 60 mg ( 2 tablets)  Am, and 30 mg ( 1 tablet) in the afternoon) 270 tablet 3  . levothyroxine (SYNTHROID, LEVOTHROID) 50 MCG tablet Take 50 mcg by mouth daily.    . memantine (NAMENDA) 10 MG tablet Take 1 tablet by mouth 2 (two) times daily.    . nitroGLYCERIN (NITROSTAT) 0.4 MG SL tablet PLACE ONE TABLET UNDER TONGUE EVERY 5 MIN UP TO 3 DOSES AS NEEDED FORCHEST PAIN. 25 tablet 3  . oxycodone (ROXICODONE) 30 MG immediate release tablet Take 30 mg by mouth every 4 (four) hours as needed for pain. Take 1 tablet by mouth every 4 to 6 hours as needed for pain.  Max 8 /24 hours.    . pantoprazole (PROTONIX) 40 MG tablet Take 1 tablet (40 mg total) by mouth daily. 30 tablet 1  . pilocarpine (SALAGEN) 5 MG tablet Take 5 mg by mouth 2 (two) times daily.    . potassium chloride SA (K-DUR,KLOR-CON) 20 MEQ tablet Take 20 mEq by mouth daily. When taking lasix    . pravastatin (PRAVACHOL) 40 MG tablet TAKE (1) TABLET BY MOUTH AT BEDTIME. 30 tablet 1  . Sennosides 25 MG TABS Take 1 tablet by mouth every 3 (three) days. To help with opioid constipation    . tamsulosin (FLOMAX) 0.4 MG CAPS capsule Take 1 capsule by mouth daily.     No current facility-administered medications for this visit.    Allergies:  Patient has no known allergies.   Social History: The patient  reports that he quit smoking about 24 years ago. His smoking use included cigarettes. He has a 80.00 pack-year smoking history. He has never used smokeless tobacco. He reports that he does not drink alcohol or use drugs.   ROS:  Please see the history of present illness. Otherwise, complete review of systems is positive for hearing and memory loss.  All other systems are reviewed and negative.   Physical Exam: VS:  BP (!) 152/84   Pulse 80   Temp 97.8 F (36.6 C)   Ht 5\' 9"  (1.753 m)   Wt 215 lb (97.5 kg)   BMI 31.75 kg/m , BMI Body mass index is  31.75 kg/m.  Wt Readings from Last 3 Encounters:  08/14/19 215 lb (97.5 kg)  02/02/19 208 lb (94.3 kg)  01/30/19 207 lb (93.9 kg)    General: Elderly male, appears comfortable at rest. HEENT: Conjunctiva and lids normal, wearing a mask. Neck: Supple, no elevated JVP or carotid bruits, no thyromegaly. Lungs: Clear to auscultation, nonlabored breathing at rest. Cardiac: Regular rate and rhythm, no S3 or significant systolic murmur, no pericardial rub. Abdomen: Protuberant, bowel sounds present, no guarding or  rebound. Extremities: No pitting edema, distal pulses 2+. Skin: Warm and dry. Musculoskeletal: No kyphosis. Neuropsychiatric: Alert and oriented x3, affect grossly appropriate.  Hearing loss evident.  ECG:  An ECG dated 06/20/2019 was personally reviewed today and demonstrated:  Sinus rhythm with PVCs, old anterior infarct pattern.  Recent Labwork: 11/07/2018: BUN 17; Creatinine, Ser 1.06; Potassium 3.7; Sodium 135 11/08/2018: Hemoglobin 12.4; Platelets 228   Other Studies Reviewed Today:  Echocardiogram 11/08/2018: Study Conclusions  - Left ventricle: The cavity size was normal. Wall thickness was increased in a pattern of mild LVH. Systolic function was normal. The estimated ejection fraction was in the range of 55% to 60%. Wall motion was normal; there were no regional wall motion abnormalities. Features are consistent with a pseudonormal left ventricular filling pattern, with concomitant abnormal relaxation and increased filling pressure (grade 2 diastolic dysfunction). Doppler parameters are consistent with high ventricular filling pressure. - Aortic valve: Moderately to severely calcified annulus. Trileaflet. - Mitral valve: Mildly calcified annulus. Normal thickness leaflets . There was mild regurgitation. - Left atrium: The atrium was mildly dilated. - Right atrium: The atrium was mildly dilated. - Atrial septum: No defect or patent foramen  ovale was identified. - Tricuspid valve: There was mild regurgitation.  Lexiscan Myoview 12/28/2016:  Inferior defect that shows partial improvement consistent with scar/soft tissue attenuation and periinfarct ischemia  LVEF 46%  Intermediate risk study  Assessment and Plan:  1.  Multivessel CAD status post CABG and subsequent DES intervention to the SVG to PDA as of 2012.  We are following him conservatively on medical therapy in the absence of progressive angina symptoms.  Refill provided for Plavix.  Continue with remaining medications.  2.  Essential hypertension, systolic is in the 371I today.  We discussed diet and weight loss.  Keep follow-up with PCP.  3.  Mixed hyperlipidemia on Pravachol.  Requesting interval lab work from [facility].  4.  Asymptomatic, nonobstructive carotid artery disease.  Continue Plavix and statin.  Medication Adjustments/Labs and Tests Ordered: Current medicines are reviewed at length with the patient today.  Concerns regarding medicines are outlined above.   Tests Ordered: No orders of the defined types were placed in this encounter.   Medication Changes: Meds ordered this encounter  Medications  . clopidogrel (PLAVIX) 75 MG tablet    Sig: Take 1 tablet (75 mg total) by mouth daily.    Dispense:  90 tablet    Refill:  3    Disposition:  Follow up 6 months in the [place] office.  Signed, Satira Sark, MD, [facility] 08/14/2019 2:23 PM    [facility] [address] [place], [place] [address] Phone: [phone]; Fax: [phone]

## 2019-08-15 ENCOUNTER — Encounter: Payer: Self-pay | Admitting: Internal Medicine

## 2019-10-01 ENCOUNTER — Other Ambulatory Visit: Payer: Self-pay

## 2019-10-01 ENCOUNTER — Ambulatory Visit (HOSPITAL_COMMUNITY)
Admission: RE | Admit: 2019-10-01 | Discharge: 2019-10-01 | Disposition: A | Discharge status: Home / Self Care | Payer: PPO | Source: Ambulatory Visit | Attending: Internal Medicine | Admitting: Internal Medicine

## 2019-10-01 ENCOUNTER — Other Ambulatory Visit (HOSPITAL_COMMUNITY): Payer: Self-pay | Admitting: Internal Medicine

## 2019-10-01 DIAGNOSIS — Z23 Encounter for immunization: Secondary | ICD-10-CM | POA: Diagnosis not present

## 2019-10-01 DIAGNOSIS — R05 Cough: Secondary | ICD-10-CM | POA: Diagnosis not present

## 2019-10-01 DIAGNOSIS — R059 Cough, unspecified: Secondary | ICD-10-CM

## 2019-10-01 DIAGNOSIS — G894 Chronic pain syndrome: Secondary | ICD-10-CM | POA: Diagnosis not present

## 2019-10-01 DIAGNOSIS — M1991 Primary osteoarthritis, unspecified site: Secondary | ICD-10-CM | POA: Diagnosis not present

## 2019-10-01 DIAGNOSIS — Z6830 Body mass index (BMI) 30.0-30.9, adult: Secondary | ICD-10-CM | POA: Diagnosis not present

## 2019-10-01 DIAGNOSIS — I10 Essential (primary) hypertension: Secondary | ICD-10-CM | POA: Diagnosis not present

## 2019-10-01 DIAGNOSIS — J449 Chronic obstructive pulmonary disease, unspecified: Secondary | ICD-10-CM | POA: Diagnosis not present

## 2019-10-01 DIAGNOSIS — R0602 Shortness of breath: Secondary | ICD-10-CM | POA: Diagnosis not present

## 2019-10-01 DIAGNOSIS — E063 Autoimmune thyroiditis: Secondary | ICD-10-CM | POA: Diagnosis not present

## 2019-11-13 ENCOUNTER — Other Ambulatory Visit (HOSPITAL_COMMUNITY): Payer: Self-pay | Admitting: Internal Medicine

## 2019-11-13 DIAGNOSIS — E063 Autoimmune thyroiditis: Secondary | ICD-10-CM | POA: Diagnosis not present

## 2019-11-13 DIAGNOSIS — G894 Chronic pain syndrome: Secondary | ICD-10-CM | POA: Diagnosis not present

## 2019-11-13 DIAGNOSIS — Z6829 Body mass index (BMI) 29.0-29.9, adult: Secondary | ICD-10-CM | POA: Diagnosis not present

## 2019-11-13 DIAGNOSIS — M1991 Primary osteoarthritis, unspecified site: Secondary | ICD-10-CM | POA: Diagnosis not present

## 2019-11-13 DIAGNOSIS — J441 Chronic obstructive pulmonary disease with (acute) exacerbation: Secondary | ICD-10-CM | POA: Diagnosis not present

## 2019-11-13 DIAGNOSIS — R0601 Orthopnea: Secondary | ICD-10-CM | POA: Diagnosis not present

## 2019-11-13 DIAGNOSIS — R0600 Dyspnea, unspecified: Secondary | ICD-10-CM

## 2019-11-20 ENCOUNTER — Telehealth: Payer: Self-pay | Admitting: Cardiology

## 2019-11-20 NOTE — Telephone Encounter (Signed)
Attempt to reach, no answer,mail box full, unable to leave message.-cc

## 2019-11-20 NOTE — Telephone Encounter (Signed)
Pt is having problems w/ shortness of breath- can't walk to the mailbox and he's getting short winded   Please call [phone]

## 2019-11-21 NOTE — Telephone Encounter (Signed)
Returned pt call. He states that he is getting more short winded while walking to the mail box. He states that he was suppose to use oxygen, but he does not like to mess with it. He stated that Dr. Gerarda Fraction was going to order a breathing test, but he has not heard anything from them about this. I advised him to contact Dr. Gerarda Fraction concerning the test he wanted him to have. While on the phone with the patient he seemed to have dozed off, taking a long break in our conversation where he advised me he dropped the phone and fell back top sleep. I asked him if he had a way to check his oxygen level, he did and it was 96. He was wondering if he needed to be seen sooner than February. Please advise.

## 2019-11-24 NOTE — Telephone Encounter (Signed)
I would advise him to start using his oxygen regularly as directed this may help significantly.  He needs to keep follow-up with Dr. Gerarda Fraction for testing that they have planned.

## 2019-11-26 NOTE — Telephone Encounter (Signed)
Called pt. Made aware, he will follow up with pcp.

## 2019-11-30 ENCOUNTER — Telehealth: Payer: Self-pay | Admitting: Cardiology

## 2019-11-30 NOTE — Telephone Encounter (Signed)
Pt called and c/o SOB when walking, chest pain and swelling at times. Pt denies chest pain at this time and states last episode was 3 days ago. Pt states that he has taken NTG with relief. Current wt. Is 211lbs. BP is 151/73 HR is 54. Please advise

## 2019-11-30 NOTE — Telephone Encounter (Signed)
Pt called and notified. Pt stated " I will come today after I shave and bathe and I feel like it." Pt again encouraged to be seen in the ER. And stated "I will today when I feel like it.".

## 2019-11-30 NOTE — Telephone Encounter (Signed)
Per chart review his home cardiac regimen includes Plavix, Lasix with potassium supplements, Imdur, and Pravachol.  With heart rate reported in the 50s would not add beta-blocker as antianginal.  Might be able to increase Imdur further to 60 mg twice daily, but I am concerned that if his symptoms are progressive and recurring he should be seen in the ER for more efficient evaluation and assessment.

## 2019-11-30 NOTE — Telephone Encounter (Signed)
Called patient to try to schedule appointment offering [facility] next week- stated that he is very weak and has hard time breathing and feels he needs another CATH.    Notes from PCP have been given to nurse.

## 2019-12-06 ENCOUNTER — Other Ambulatory Visit: Payer: Self-pay | Admitting: *Deleted

## 2019-12-07 ENCOUNTER — Encounter (HOSPITAL_COMMUNITY): Payer: Self-pay | Admitting: *Deleted

## 2019-12-07 ENCOUNTER — Other Ambulatory Visit: Payer: Self-pay

## 2019-12-07 ENCOUNTER — Observation Stay (HOSPITAL_COMMUNITY)
Admission: EM | Admit: 2019-12-07 | Discharge: 2019-12-08 | Disposition: A | Discharge status: Home / Self Care | Payer: PPO | Source: Home / Self Care | Attending: Emergency Medicine | Admitting: Emergency Medicine

## 2019-12-07 ENCOUNTER — Emergency Department (HOSPITAL_COMMUNITY): Payer: PPO

## 2019-12-07 DIAGNOSIS — I6523 Occlusion and stenosis of bilateral carotid arteries: Secondary | ICD-10-CM | POA: Diagnosis present

## 2019-12-07 DIAGNOSIS — J9601 Acute respiratory failure with hypoxia: Secondary | ICD-10-CM | POA: Insufficient documentation

## 2019-12-07 DIAGNOSIS — G309 Alzheimer's disease, unspecified: Secondary | ICD-10-CM | POA: Diagnosis present

## 2019-12-07 DIAGNOSIS — J449 Chronic obstructive pulmonary disease, unspecified: Secondary | ICD-10-CM | POA: Diagnosis not present

## 2019-12-07 DIAGNOSIS — E782 Mixed hyperlipidemia: Secondary | ICD-10-CM | POA: Diagnosis present

## 2019-12-07 DIAGNOSIS — J441 Chronic obstructive pulmonary disease with (acute) exacerbation: Secondary | ICD-10-CM | POA: Diagnosis present

## 2019-12-07 DIAGNOSIS — I110 Hypertensive heart disease with heart failure: Secondary | ICD-10-CM | POA: Diagnosis present

## 2019-12-07 DIAGNOSIS — I340 Nonrheumatic mitral (valve) insufficiency: Secondary | ICD-10-CM | POA: Diagnosis not present

## 2019-12-07 DIAGNOSIS — N179 Acute kidney failure, unspecified: Secondary | ICD-10-CM | POA: Insufficient documentation

## 2019-12-07 DIAGNOSIS — G4733 Obstructive sleep apnea (adult) (pediatric): Secondary | ICD-10-CM | POA: Diagnosis present

## 2019-12-07 DIAGNOSIS — G8929 Other chronic pain: Secondary | ICD-10-CM | POA: Diagnosis present

## 2019-12-07 DIAGNOSIS — K5909 Other constipation: Secondary | ICD-10-CM | POA: Diagnosis present

## 2019-12-07 DIAGNOSIS — I10 Essential (primary) hypertension: Secondary | ICD-10-CM | POA: Diagnosis not present

## 2019-12-07 DIAGNOSIS — I25790 Atherosclerosis of other coronary artery bypass graft(s) with unstable angina pectoris: Secondary | ICD-10-CM | POA: Diagnosis present

## 2019-12-07 DIAGNOSIS — I447 Left bundle-branch block, unspecified: Secondary | ICD-10-CM | POA: Diagnosis present

## 2019-12-07 DIAGNOSIS — Z955 Presence of coronary angioplasty implant and graft: Secondary | ICD-10-CM | POA: Insufficient documentation

## 2019-12-07 DIAGNOSIS — R3911 Hesitancy of micturition: Secondary | ICD-10-CM | POA: Diagnosis not present

## 2019-12-07 DIAGNOSIS — I5032 Chronic diastolic (congestive) heart failure: Secondary | ICD-10-CM | POA: Diagnosis present

## 2019-12-07 DIAGNOSIS — R0902 Hypoxemia: Secondary | ICD-10-CM | POA: Diagnosis present

## 2019-12-07 DIAGNOSIS — I25710 Atherosclerosis of autologous vein coronary artery bypass graft(s) with unstable angina pectoris: Secondary | ICD-10-CM | POA: Diagnosis not present

## 2019-12-07 DIAGNOSIS — Z951 Presence of aortocoronary bypass graft: Secondary | ICD-10-CM | POA: Insufficient documentation

## 2019-12-07 DIAGNOSIS — R079 Chest pain, unspecified: Secondary | ICD-10-CM | POA: Diagnosis present

## 2019-12-07 DIAGNOSIS — N400 Enlarged prostate without lower urinary tract symptoms: Secondary | ICD-10-CM | POA: Insufficient documentation

## 2019-12-07 DIAGNOSIS — R338 Other retention of urine: Secondary | ICD-10-CM | POA: Diagnosis present

## 2019-12-07 DIAGNOSIS — Z79890 Hormone replacement therapy (postmenopausal): Secondary | ICD-10-CM | POA: Insufficient documentation

## 2019-12-07 DIAGNOSIS — Z79899 Other long term (current) drug therapy: Secondary | ICD-10-CM | POA: Insufficient documentation

## 2019-12-07 DIAGNOSIS — R062 Wheezing: Secondary | ICD-10-CM

## 2019-12-07 DIAGNOSIS — F329 Major depressive disorder, single episode, unspecified: Secondary | ICD-10-CM | POA: Diagnosis present

## 2019-12-07 DIAGNOSIS — Z7902 Long term (current) use of antithrombotics/antiplatelets: Secondary | ICD-10-CM | POA: Insufficient documentation

## 2019-12-07 DIAGNOSIS — I25110 Atherosclerotic heart disease of native coronary artery with unstable angina pectoris: Secondary | ICD-10-CM | POA: Diagnosis present

## 2019-12-07 DIAGNOSIS — I25119 Atherosclerotic heart disease of native coronary artery with unspecified angina pectoris: Secondary | ICD-10-CM | POA: Diagnosis not present

## 2019-12-07 DIAGNOSIS — N401 Enlarged prostate with lower urinary tract symptoms: Secondary | ICD-10-CM | POA: Diagnosis present

## 2019-12-07 DIAGNOSIS — R410 Disorientation, unspecified: Secondary | ICD-10-CM

## 2019-12-07 DIAGNOSIS — M1990 Unspecified osteoarthritis, unspecified site: Secondary | ICD-10-CM | POA: Diagnosis present

## 2019-12-07 DIAGNOSIS — R0602 Shortness of breath: Secondary | ICD-10-CM

## 2019-12-07 DIAGNOSIS — E039 Hypothyroidism, unspecified: Secondary | ICD-10-CM | POA: Diagnosis present

## 2019-12-07 DIAGNOSIS — M549 Dorsalgia, unspecified: Secondary | ICD-10-CM | POA: Diagnosis present

## 2019-12-07 DIAGNOSIS — J9811 Atelectasis: Secondary | ICD-10-CM | POA: Diagnosis present

## 2019-12-07 DIAGNOSIS — Z20828 Contact with and (suspected) exposure to other viral communicable diseases: Secondary | ICD-10-CM | POA: Diagnosis present

## 2019-12-07 DIAGNOSIS — K5900 Constipation, unspecified: Secondary | ICD-10-CM | POA: Diagnosis not present

## 2019-12-07 DIAGNOSIS — G9341 Metabolic encephalopathy: Secondary | ICD-10-CM | POA: Insufficient documentation

## 2019-12-07 DIAGNOSIS — Z13220 Encounter for screening for lipoid disorders: Secondary | ICD-10-CM | POA: Diagnosis not present

## 2019-12-07 DIAGNOSIS — I2510 Atherosclerotic heart disease of native coronary artery without angina pectoris: Secondary | ICD-10-CM | POA: Insufficient documentation

## 2019-12-07 DIAGNOSIS — F419 Anxiety disorder, unspecified: Secondary | ICD-10-CM | POA: Diagnosis present

## 2019-12-07 DIAGNOSIS — F0280 Dementia in other diseases classified elsewhere without behavioral disturbance: Secondary | ICD-10-CM | POA: Diagnosis present

## 2019-12-07 DIAGNOSIS — I252 Old myocardial infarction: Secondary | ICD-10-CM | POA: Diagnosis not present

## 2019-12-07 LAB — CBC WITH DIFFERENTIAL/PLATELET
Abs Immature Granulocytes: 0.02 10*3/uL (ref 0.00–0.07)
Basophils Absolute: 0.1 10*3/uL (ref 0.0–0.1)
Basophils Relative: 1 %
Eosinophils Absolute: 0.2 10*3/uL (ref 0.0–0.5)
Eosinophils Relative: 2 %
HCT: 39.2 % (ref 39.0–52.0)
Hemoglobin: 12.5 g/dL — ABNORMAL LOW (ref 13.0–17.0)
Immature Granulocytes: 0 %
Lymphocytes Relative: 17 %
Lymphs Abs: 1.7 10*3/uL (ref 0.7–4.0)
MCH: 30.9 pg (ref 26.0–34.0)
MCHC: 31.9 g/dL (ref 30.0–36.0)
MCV: 96.8 fL (ref 80.0–100.0)
Monocytes Absolute: 0.7 10*3/uL (ref 0.1–1.0)
Monocytes Relative: 7 %
Neutro Abs: 7.4 10*3/uL (ref 1.7–7.7)
Neutrophils Relative %: 73 %
Platelets: 214 10*3/uL (ref 150–400)
RBC: 4.05 MIL/uL — ABNORMAL LOW (ref 4.22–5.81)
RDW: 13 % (ref 11.5–15.5)
WBC: 10 10*3/uL (ref 4.0–10.5)
nRBC: 0 % (ref 0.0–0.2)

## 2019-12-07 LAB — BLOOD GAS, VENOUS
Acid-Base Excess: 6.1 mmol/L — ABNORMAL HIGH (ref 0.0–2.0)
Bicarbonate: 25 mmol/L (ref 20.0–28.0)
FIO2: 28
O2 Saturation: 47.1 %
Patient temperature: 37
pCO2, Ven: 69 mmHg — ABNORMAL HIGH (ref 44.0–60.0)
pH, Ven: 7.292 (ref 7.250–7.430)
pO2, Ven: 32 mmHg (ref 32.0–45.0)

## 2019-12-07 LAB — COMPREHENSIVE METABOLIC PANEL
ALT: 21 U/L (ref 0–44)
AST: 28 U/L (ref 15–41)
Albumin: 4.1 g/dL (ref 3.5–5.0)
Alkaline Phosphatase: 57 U/L (ref 38–126)
Anion gap: 10 (ref 5–15)
BUN: 23 mg/dL (ref 8–23)
CO2: 29 mmol/L (ref 22–32)
Calcium: 8.8 mg/dL — ABNORMAL LOW (ref 8.9–10.3)
Chloride: 98 mmol/L (ref 98–111)
Creatinine, Ser: 1.46 mg/dL — ABNORMAL HIGH (ref 0.61–1.24)
GFR calc Af Amer: 51 mL/min — ABNORMAL LOW (ref >60)
GFR calc non Af Amer: 44 mL/min — ABNORMAL LOW (ref >60)
Glucose, Bld: 120 mg/dL — ABNORMAL HIGH (ref 70–99)
Potassium: 3.8 mmol/L (ref 3.5–5.1)
Sodium: 137 mmol/L (ref 135–145)
Total Bilirubin: 1 mg/dL (ref 0.3–1.2)
Total Protein: 7.1 g/dL (ref 6.5–8.1)

## 2019-12-07 LAB — URINALYSIS, ROUTINE W REFLEX MICROSCOPIC
Bilirubin Urine: NEGATIVE
Glucose, UA: NEGATIVE mg/dL
Hgb urine dipstick: NEGATIVE
Ketones, ur: NEGATIVE mg/dL
Leukocytes,Ua: NEGATIVE
Nitrite: NEGATIVE
Protein, ur: NEGATIVE mg/dL
Specific Gravity, Urine: 1.012 (ref 1.005–1.030)
pH: 5 (ref 5.0–8.0)

## 2019-12-07 LAB — AMMONIA: Ammonia: 22 umol/L (ref 9–35)

## 2019-12-07 LAB — POC SARS CORONAVIRUS 2 AG -  ED: SARS Coronavirus 2 Ag: NEGATIVE

## 2019-12-07 LAB — TSH: TSH: 6.201 u[IU]/mL — ABNORMAL HIGH (ref 0.350–4.500)

## 2019-12-07 LAB — BRAIN NATRIURETIC PEPTIDE: B Natriuretic Peptide: 41 pg/mL (ref 0.0–100.0)

## 2019-12-07 LAB — TROPONIN I (HIGH SENSITIVITY)
Troponin I (High Sensitivity): 9 ng/L (ref <18)
Troponin I (High Sensitivity): 8 ng/L (ref <18)

## 2019-12-07 MED ORDER — TAMSULOSIN HCL 0.4 MG PO CAPS
0.4000 mg | ORAL_CAPSULE | Freq: Every day | ORAL | Status: DC
  Administered 2019-12-07 – 2019-12-08 (×2): 0.4 mg via ORAL
  Filled 2019-12-07 (×2): qty 1

## 2019-12-07 MED ORDER — ESCITALOPRAM OXALATE 10 MG PO TABS
20.0000 mg | ORAL_TABLET | Freq: Every morning | ORAL | Status: DC
  Administered 2019-12-08: 20 mg via ORAL
  Filled 2019-12-07: qty 2

## 2019-12-07 MED ORDER — POLYETHYLENE GLYCOL 3350 17 G PO PACK: 17.0000 g | PACK | Freq: Every day | ORAL | Status: DC | PRN

## 2019-12-07 MED ORDER — QUETIAPINE FUMARATE 25 MG PO TABS
25.0000 mg | ORAL_TABLET | Freq: Every day | ORAL | Status: DC
  Administered 2019-12-07: 25 mg via ORAL
  Filled 2019-12-07: qty 1

## 2019-12-07 MED ORDER — IPRATROPIUM-ALBUTEROL 0.5-2.5 (3) MG/3ML IN SOLN
3.0000 mL | Freq: Four times a day (QID) | RESPIRATORY_TRACT | Status: DC
  Administered 2019-12-08: 3 mL via RESPIRATORY_TRACT
  Filled 2019-12-07 (×2): qty 3

## 2019-12-07 MED ORDER — POTASSIUM CHLORIDE CRYS ER 20 MEQ PO TBCR
20.0000 meq | EXTENDED_RELEASE_TABLET | Freq: Every day | ORAL | Status: DC
  Administered 2019-12-08: 20 meq via ORAL
  Filled 2019-12-07: qty 1

## 2019-12-07 MED ORDER — MEMANTINE HCL 10 MG PO TABS
10.0000 mg | ORAL_TABLET | Freq: Two times a day (BID) | ORAL | Status: DC
  Administered 2019-12-07 – 2019-12-08 (×2): 10 mg via ORAL
  Filled 2019-12-07 (×2): qty 1

## 2019-12-07 MED ORDER — ACETAMINOPHEN 325 MG PO TABS: 650.0000 mg | ORAL_TABLET | Freq: Four times a day (QID) | ORAL | Status: DC | PRN

## 2019-12-07 MED ORDER — SODIUM CHLORIDE 0.9 % IV SOLN
500.0000 mg | INTRAVENOUS | Status: DC
  Administered 2019-12-07: 500 mg via INTRAVENOUS
  Filled 2019-12-07: qty 500

## 2019-12-07 MED ORDER — PILOCARPINE HCL 5 MG PO TABS
5.0000 mg | ORAL_TABLET | Freq: Two times a day (BID) | ORAL | Status: DC
  Administered 2019-12-07 – 2019-12-08 (×2): 5 mg via ORAL
  Filled 2019-12-07 (×6): qty 1

## 2019-12-07 MED ORDER — ALBUTEROL SULFATE HFA 108 (90 BASE) MCG/ACT IN AERS
4.0000 | INHALATION_SPRAY | Freq: Once | RESPIRATORY_TRACT | Status: AC
  Administered 2019-12-07: 4 via RESPIRATORY_TRACT
  Filled 2019-12-07: qty 6.7

## 2019-12-07 MED ORDER — ONDANSETRON HCL 4 MG PO TABS: 4.0000 mg | ORAL_TABLET | Freq: Four times a day (QID) | ORAL | Status: DC | PRN

## 2019-12-07 MED ORDER — CLOPIDOGREL BISULFATE 75 MG PO TABS
75.0000 mg | ORAL_TABLET | Freq: Every day | ORAL | Status: DC
  Administered 2019-12-08: 75 mg via ORAL
  Filled 2019-12-07: qty 1

## 2019-12-07 MED ORDER — DM-GUAIFENESIN ER 30-600 MG PO TB12
1.0000 | ORAL_TABLET | Freq: Two times a day (BID) | ORAL | Status: DC
  Administered 2019-12-07 – 2019-12-08 (×2): 1 via ORAL
  Filled 2019-12-07 (×2): qty 1

## 2019-12-07 MED ORDER — PANTOPRAZOLE SODIUM 40 MG PO TBEC
40.0000 mg | DELAYED_RELEASE_TABLET | Freq: Every morning | ORAL | Status: DC
  Administered 2019-12-08: 40 mg via ORAL
  Filled 2019-12-07: qty 1

## 2019-12-07 MED ORDER — METHYLPREDNISOLONE SODIUM SUCC 125 MG IJ SOLR
125.0000 mg | Freq: Once | INTRAMUSCULAR | Status: AC
  Administered 2019-12-07: 125 mg via INTRAVENOUS
  Filled 2019-12-07: qty 2

## 2019-12-07 MED ORDER — ISOSORBIDE MONONITRATE ER 60 MG PO TB24
30.0000 mg | ORAL_TABLET | Freq: Every morning | ORAL | Status: DC
  Administered 2019-12-08: 30 mg via ORAL
  Filled 2019-12-07: qty 1

## 2019-12-07 MED ORDER — SODIUM CHLORIDE 0.9 % IV SOLN
INTRAVENOUS | Status: DC
  Administered 2019-12-07 – 2019-12-08 (×2): via INTRAVENOUS

## 2019-12-07 MED ORDER — IPRATROPIUM-ALBUTEROL 0.5-2.5 (3) MG/3ML IN SOLN
3.0000 mL | RESPIRATORY_TRACT | Status: DC | PRN
  Administered 2019-12-07: 3 mL via RESPIRATORY_TRACT
  Filled 2019-12-07: qty 3

## 2019-12-07 MED ORDER — ACETAMINOPHEN 650 MG RE SUPP: 650.0000 mg | Freq: Four times a day (QID) | RECTAL | Status: DC | PRN

## 2019-12-07 MED ORDER — ONDANSETRON HCL 4 MG/2ML IJ SOLN: 4.0000 mg | Freq: Four times a day (QID) | INTRAMUSCULAR | Status: DC | PRN

## 2019-12-07 MED ORDER — PRAVASTATIN SODIUM 40 MG PO TABS
40.0000 mg | ORAL_TABLET | Freq: Every day | ORAL | Status: DC
  Administered 2019-12-07: 40 mg via ORAL
  Filled 2019-12-07: qty 1

## 2019-12-07 MED ORDER — BUSPIRONE HCL 5 MG PO TABS
10.0000 mg | ORAL_TABLET | Freq: Two times a day (BID) | ORAL | Status: DC
  Administered 2019-12-07 – 2019-12-08 (×2): 10 mg via ORAL
  Filled 2019-12-07 (×2): qty 2

## 2019-12-07 MED ORDER — ENOXAPARIN SODIUM 40 MG/0.4ML ~~LOC~~ SOLN
40.0000 mg | SUBCUTANEOUS | Status: DC
  Administered 2019-12-07: 40 mg via SUBCUTANEOUS
  Filled 2019-12-07: qty 0.4

## 2019-12-07 MED ORDER — LEVOTHYROXINE SODIUM 50 MCG PO TABS
50.0000 ug | ORAL_TABLET | Freq: Every day | ORAL | Status: DC
  Administered 2019-12-08: 50 ug via ORAL
  Filled 2019-12-07: qty 1

## 2019-12-07 MED ORDER — DUTASTERIDE 0.5 MG PO CAPS
0.5000 mg | ORAL_CAPSULE | Freq: Every day | ORAL | Status: DC
  Administered 2019-12-07 – 2019-12-08 (×2): 0.5 mg via ORAL
  Filled 2019-12-07 (×4): qty 1

## 2019-12-07 MED ORDER — METHYLPREDNISOLONE SODIUM SUCC 125 MG IJ SOLR
60.0000 mg | Freq: Two times a day (BID) | INTRAMUSCULAR | Status: DC
  Administered 2019-12-08: 60 mg via INTRAVENOUS
  Filled 2019-12-07: qty 2

## 2019-12-07 NOTE — ED Provider Notes (Signed)
Emergency Department Provider Note   I have reviewed the triage vital signs and the nursing notes.   HISTORY  Chief Complaint Shortness of Breath   HPI Dustin [name] is a 85 y.o. male with PMH of COPD, CAD, Alzheimer's disease, OSA, and HTN presents to the emergency department for evaluation of progressively worsening shortness of breath, wheezing, and confusion.  Patient's wife states that symptoms have been actually progressing over the past 3 weeks.  They have been following with Dr. Legrand Rams who has provided steroid and outpatient management for COPD.  Life states this will improve symptoms briefly but then his wheezing and shortness of breath returns.  Approximately 8 months to a year ago he was on home oxygen but ultimately sent it back but wife states at the time his oxygen levels were normal without it.  He is having occasional chest pain and has been in touch with his cardiologist Dr. Domenic Polite.  No chest pain today but did have a brief episode yesterday.  Wife states that in addition to the above symptoms she is concerned that he is having difficulty urinating.  She states that sometimes he will stand at the toilet for almost an hour with an inability to urinate.  He last urinated this morning.  She states that he seems to be more sleepy and confused today especially.  She states that he has been calling her "mama" and being somewhat repetitive.  No recent falls.  Patient and wife deny noticing any fever, cough, runny nose, sore throat.   Level 5 caveat: Dementia   Past Medical History:  Diagnosis Date  . Alzheimer disease ([place])   . Anxiety   . Arthritis   . Atrophic gastritis    a. By EGD 02/2013.  . Carotid artery disease ([place])    a. mild-mod plaque, <50% stenosis bilat by duplex 2018.  [name] Coronary atherosclerosis of native coronary artery    a. Multivessel s/p CABG 1996. b. s/p DES x 2 SVG to PDA 8/12 with distal disease managed medically.  . DDD (degenerative disc disease)     Chronic back pain  . Enlarged prostate   . Essential hypertension, benign   . Hematuria   . Hypothyroidism   . LBBB (left bundle branch block)   . MI (myocardial infarction) ([place])   . Mixed hyperlipidemia   . OA (osteoarthritis)   . OSA (obstructive sleep apnea)   . Sinus bradycardia    a. Aricept and BB discontinued due to this.    Patient Active Problem List   Diagnosis Date Noted  . COPD exacerbation ([place]) 12/07/2019  . Unstable angina ([place]) 11/07/2018  . Fall 11/07/2018  . Gait instability 11/07/2018  . Leukocytosis 11/07/2018  . Depression 11/07/2018  . BPH (benign prostatic hyperplasia) 11/07/2018  . Chronic pain 11/07/2018  . Pressure injury of skin 02/15/2017  . Chronic diastolic CHF (congestive heart failure) ([place]) 12/24/2016  . Sinus bradycardia 12/23/2016  . Foot pain, bilateral 12/23/2016  . Hypokalemia 04/05/2016  . Acute respiratory failure ([place]) 04/03/2016  . Acute encephalopathy 04/03/2016  . Hypothyroidism 04/03/2016  . Aspiration pneumonia ([place]) 04/03/2016  . Dementia ([place]) 04/03/2016  . Acute respiratory failure with hypoxia ([place]) 04/03/2016  . Memory loss 04/20/2015  . Hypoxia 01/27/2015  . CAP (community acquired pneumonia) 01/27/2015  . Fever 08/30/2014  . Healthcare associated bacterial pneumonia 08/30/2014  . Toxic Metabolic encephalopathy 99991111  . Sepsis ([place]) 08/30/2014  . Neck pain on right side 08/29/2014  . Neck pain  08/29/2014  . Left-sided weakness 06/28/2014  . Chest pain 06/27/2014  . Tubular adenoma of colon 03/05/2013  . Anorexia 03/05/2013  . Loss of weight 03/05/2013  . Chronic constipation 07/04/2012  . Bronchitis 08/16/2011  . Hyperlipidemia 10/01/2009  . OSA (obstructive sleep apnea) 10/01/2009  . Alzheimer disease ([place]) 10/01/2009  . HYPERTENSION, BENIGN 10/01/2009  . Coronary artery disease 10/01/2009    Past Surgical History:  Procedure Laterality Date  . COLONOSCOPY  08/03/2004   Jenkins-numerous large  diverticula in the descending, transverse, descending, and sigmoid colon. Otherwise normal exam.  . COLONOSCOPY  07/12/2012   RMR: External hemorrhoidal tag; multiple rectal and colonic polyps removed and/or treated as described above. Pancolonic diverticulosis. Bx-tubular adenomas, rectal hyperplastic polyp. next colonoscopy in 06/2015.  [name] COLONOSCOPY N/A 06/22/2016   Procedure: COLONOSCOPY;  Surgeon: Aviva Signs, MD;  Location: [facility];  Service: Gastroenterology;  Laterality: N/A;  730  . CORONARY ANGIOPLASTY WITH STENT PLACEMENT    . CORONARY ARTERY BYPASS GRAFT  1996   LIMA to LAD, SVG to D2, SVG to PDA, SVG to OM1 and OM2  . ESOPHAGOGASTRODUODENOSCOPY N/A 03/16/2013   Procedure: ESOPHAGOGASTRODUODENOSCOPY (EGD);  Surgeon: Daneil Dolin, MD;  Location: [facility];  Service: Endoscopy;  Laterality: N/A;  12:00-moved to [place] notified pt  . ESOPHAGOGASTRODUODENOSCOPY N/A 03/06/2013   Procedure: ESOPHAGOGASTRODUODENOSCOPY (EGD);  Surgeon: Daneil Dolin, MD;  Location: [facility];  Service: Endoscopy;  Laterality: N/A;  . HERNIA REPAIR      Allergies Patient has no known allergies.  Family History  Problem Relation Age of Onset  . Heart disease Other   . Heart attack Mother   . Colon cancer Neg Hx     Social History Social History   Tobacco Use  . Smoking status: Former Smoker    Packs/day: 2.00    Years: 40.00    Pack years: 80.00    Types: Cigarettes    Quit date: 12/27/1994    Years since quitting: 24.9  . Smokeless tobacco: Never Used  Substance Use Topics  . Alcohol use: No    Alcohol/week: 0.0 standard drinks  . Drug use: No    Review of Systems  Constitutional: No fever/chills. Positive somnolence and confusion.  Eyes: No visual changes. ENT: No sore throat. Cardiovascular: Denies chest pain. Respiratory: Positive shortness of breath and wheezing.  Gastrointestinal: No abdominal pain.  No nausea, no vomiting.  No diarrhea.  No  constipation. Genitourinary: Difficulty voiding.  Musculoskeletal: Negative for back pain. Skin: Negative for rash. Neurological: Negative for headaches, focal weakness or numbness.  10-point ROS otherwise negative.  ____________________________________________   PHYSICAL EXAM:  VITAL SIGNS: ED Triage Vitals [12/07/19 1518]  Enc Vitals Group     BP 106/78     Pulse Rate 68     Resp 18     Temp 98.4 F (36.9 C)     Temp Source Oral     SpO2 (!) 89 %     Weight 210 lb (95.3 kg)     Height 5\' 10"  (1.778 m)   Constitutional: Alert for conversation but drowsy at times with some mild confusion. Well appearing and in no acute distress. Eyes: Conjunctivae are normal. Head: Atraumatic. Nose: No congestion/rhinnorhea. Mouth/Throat: Mucous membranes are moist.  Neck: No stridor.  Cardiovascular: Normal rate, regular rhythm. Good peripheral circulation. Grossly normal heart sounds.   Respiratory: Slight increased respiratory effort.  No retractions. Lungs with end expiratory wheezing bilaterally. Gastrointestinal: Soft and  nontender. No distention.  Musculoskeletal: No lower extremity tenderness nor edema. No gross deformities of extremities. Neurologic:  Normal speech and language. No gross focal neurologic deficits are appreciated.  Skin:  Skin is warm, dry and intact. No rash noted.   ____________________________________________   LABS (all labs ordered are listed, but only abnormal results are displayed)  Labs Reviewed  COMPREHENSIVE METABOLIC PANEL - Abnormal; Notable for the following components:      Result Value   Glucose, Bld 120 (*)    Creatinine, Ser 1.46 (*)    Calcium 8.8 (*)    GFR calc non Af Amer 44 (*)    GFR calc Af Amer 51 (*)    All other components within normal limits  CBC WITH DIFFERENTIAL/PLATELET - Abnormal; Notable for the following components:   RBC 4.05 (*)    Hemoglobin 12.5 (*)    All other components within normal limits  BLOOD GAS, VENOUS  - Abnormal; Notable for the following components:   pCO2, Ven 69.0 (*)    Acid-Base Excess 6.1 (*)    All other components within normal limits  TSH - Abnormal; Notable for the following components:   TSH 6.201 (*)    All other components within normal limits  URINE CULTURE  BRAIN NATRIURETIC PEPTIDE  AMMONIA  URINALYSIS, ROUTINE W REFLEX MICROSCOPIC  POC SARS CORONAVIRUS 2 AG -  ED  TROPONIN I (HIGH SENSITIVITY)  TROPONIN I (HIGH SENSITIVITY)   ____________________________________________  EKG   EKG Interpretation  Date/Time:  Friday December 07 2019 15:21:47 EST Ventricular Rate:  69 PR Interval:  168 QRS Duration: 120 QT Interval:  428 QTC Calculation: 458 R Axis:   43 Text Interpretation: Normal sinus rhythm with sinus arrhythmia Incomplete left bundle branch block Marked ST abnormality, possible inferior subendocardial injury Abnormal ECG No STEMI Confirmed by Nanda Quinton [phone]) on 12/07/2019 4:01:54 PM       ____________________________________________  RADIOLOGY  DG Chest Port 1 View  Result Date: 12/07/2019 CLINICAL DATA:  Per triage note: Family member states pt has had altered loc for the last couple days and has been unable to void; pt has audible wheezing EXAM: PORTABLE CHEST 1 VIEW COMPARISON:  10/01/2019 and earlier studies. FINDINGS: Stable changes from prior CABG surgery. Cardiac silhouette is top-normal in size. No mediastinal or hilar masses. Lungs are clear.  No pleural effusion or pneumothorax. Skeletal structures are grossly intact. IMPRESSION: No acute cardiopulmonary disease. Electronically Signed   By: Lajean Manes M.D.   On: 12/07/2019 16:47    ____________________________________________   PROCEDURES  Procedure(s) performed:   Procedures  CRITICAL CARE Performed by: [name] Total critical care time: 35 minutes Critical care time was exclusive of separately billable procedures and treating other patients. Critical care was  necessary to treat or prevent imminent or life-threatening deterioration. Critical care was time spent personally by me on the following activities: development of treatment plan with patient and/or surrogate as well as nursing, discussions with consultants, evaluation of patient's response to treatment, examination of patient, obtaining history from patient or surrogate, ordering and performing treatments and interventions, ordering and review of laboratory studies, ordering and review of radiographic studies, pulse oximetry and re-evaluation of patient's condition.  Nanda Quinton, MD Emergency Medicine  ____________________________________________   INITIAL IMPRESSION / ASSESSMENT AND PLAN / ED COURSE  Pertinent labs & imaging results that were available during my care of the patient were reviewed by me and considered in my medical decision making (see chart for  details).   Patient presents emergency department for evaluation of multiple complaints but chiefly among them are increased somnolence, confusion, COPD symptoms not responding to outpatient management attempts.  Patient requiring 2 L nasal cannula here to maintain oxygen saturation.  He is afebrile.  He has no focal neurologic deficits on my exam.  Plan for broad work-up including chest x-ray and screening blood work.  Will give albuterol and Solu-Medrol.  Very low suspicion clinically for COVID-19.  No chest pain in the last 24 hours.  We will also obtain bladder scan to rule out urine retention.   Labs reviewed which show a respiratory acidosis with CO2 of sixty-nine.  Covid antigen negative.  Plan to send PCR but low suspicion for this clinically.  Troponin is unremarkable along with BNP.  Suspect at this point the patient is failing to improve with outpatient management of COPD.  He has received Solu-Medrol and albuterol here and is currently/still requiring oxygen.  Plan for admit.   Discussed patient's case with [facility], Dr. Denton Brick to  request admission. Patient and family (if present) updated with plan. Care transferred to [facility] service.  I reviewed all nursing notes, vitals, pertinent old records, EKGs, labs, imaging (as available). ____________________________________________  FINAL CLINICAL IMPRESSION(S) / ED DIAGNOSES  Final diagnoses:  COPD exacerbation ([place])  Disorientation    MEDICATIONS GIVEN DURING THIS VISIT:  Medications  albuterol (VENTOLIN HFA) 108 (90 Base) MCG/ACT inhaler 4 puff (4 puffs Inhalation Given 12/07/19 1612)  methylPREDNISolone sodium succinate (SOLU-MEDROL) 125 mg/2 mL injection 125 mg (125 mg Intravenous Given 12/07/19 1608)    Note:  This document was prepared using Dragon voice recognition software and may include unintentional dictation errors.  Nanda Quinton, MD, [facility] Emergency Medicine    Harry Shuck, [facility], MD 12/07/19 1945

## 2019-12-07 NOTE — H&P (Signed)
History and Physical    Dustin [name] [id number] DOB: [birth year] DOA: 12/07/2019  PCP: [name], MD   Patient coming from: Home  I have personally briefly reviewed patient's old medical records in [place]  Chief Complaint: Altered mental status, difficulty breathing  HPI: Dustin [name] is a 85 y.o. male with medical history significant for COPD, hypertension, diastolic CHF, BPH, left bundle branch block, Alzheimer's, obstructive sleep apnea, coronary artery disease. Patient was brought to the ED with reports of altered mental status for 2 days, also reports of patient being unable to void and wheezing.  Time of my evaluation patient is awake and alert, answering questions appropriately.  Per chart review patient was calling his spouse "mama" and repeating the same questions.  Patient does not exactly remember this, but he tells me he has not been sleeping well at night due to difficulty breathing and wheezing, this could account for his confusion during the day. He reports increasing difficulty breathing, with worsening cough productive of whitish to cream-colored sputum over the past week.  No fever no chills.  Reports some improvement with same symptoms after prescription of steroids and antibiotics by his primary care provider, return when he has completed course.  He denies chest pain, no lower extremity swelling.  He is not on home O2.  ED Course: O2 sats 88% on room air, improved with 2 L nasal cannula to greater than 92%.  Point-of-care Covid test negative.  Hs troponin 8 > 9.  Normal ammonia level.  Mildly elevated TSH at 6.2.  Normal BNP 41.  Creatinine mildly elevated at 1.46.  Portable chest x-ray negative for acute cardiopulmonary disease.  IV Solu-Medrol 125 mg x 1 given,  Albuterol inhaler.  Hospitalist admit for further evaluation and management  Review of Systems: As per HPI all other systems reviewed and negative.  Past Medical History:  Diagnosis Date    . Alzheimer disease ([place])   . Anxiety   . Arthritis   . Atrophic gastritis    a. By EGD 02/2013.  . Carotid artery disease ([place])    a. mild-mod plaque, <50% stenosis bilat by duplex 2018.  [name] Coronary atherosclerosis of native coronary artery    a. Multivessel s/p CABG 1996. b. s/p DES x 2 SVG to PDA 8/12 with distal disease managed medically.  . DDD (degenerative disc disease)    Chronic back pain  . Enlarged prostate   . Essential hypertension, benign   . Hematuria   . Hypothyroidism   . LBBB (left bundle branch block)   . MI (myocardial infarction) ([place])   . Mixed hyperlipidemia   . OA (osteoarthritis)   . OSA (obstructive sleep apnea)   . Sinus bradycardia    a. Aricept and BB discontinued due to this.    Past Surgical History:  Procedure Laterality Date  . COLONOSCOPY  08/03/2004   Jenkins-numerous large diverticula in the descending, transverse, descending, and sigmoid colon. Otherwise normal exam.  . COLONOSCOPY  07/12/2012   RMR: External hemorrhoidal tag; multiple rectal and colonic polyps removed and/or treated as described above. Pancolonic diverticulosis. Bx-tubular adenomas, rectal hyperplastic polyp. next colonoscopy in 06/2015.  [name] COLONOSCOPY N/A 06/22/2016   Procedure: COLONOSCOPY;  Surgeon: Aviva Signs, MD;  [facility]: [facility];  Service: Gastroenterology;  Laterality: N/A;  730  . CORONARY ANGIOPLASTY WITH STENT PLACEMENT    . CORONARY ARTERY BYPASS GRAFT  1996   LIMA to LAD, SVG to D2, SVG to PDA, SVG to  OM1 and OM2  . ESOPHAGOGASTRODUODENOSCOPY N/A 03/16/2013   Procedure: ESOPHAGOGASTRODUODENOSCOPY (EGD);  Surgeon: Daneil Dolin, MD;  Location: [facility];  Service: Endoscopy;  Laterality: N/A;  12:00-moved to [place] notified pt  . ESOPHAGOGASTRODUODENOSCOPY N/A 03/06/2013   Procedure: ESOPHAGOGASTRODUODENOSCOPY (EGD);  Surgeon: Daneil Dolin, MD;  Location: [facility];  Service: Endoscopy;  Laterality: N/A;  . HERNIA REPAIR       reports  that he quit smoking about 24 years ago. His smoking use included cigarettes. He has a 80.00 pack-year smoking history. He has never used smokeless tobacco. He reports that he does not drink alcohol or use drugs.  No Known Allergies  Family History  Problem Relation Age of Onset  . Heart disease Other   . Heart attack Mother   . Colon cancer Neg Hx     Prior to Admission medications   Medication Sig Start Date End Date Taking? Authorizing Provider  albuterol (PROVENTIL) (2.5 MG/3ML) 0.083% nebulizer solution Take 3 mLs (2.5 mg total) by nebulization every 6 (six) hours as needed for wheezing or shortness of breath. Patient will also need the nebulizer machine with this prescription. 10/22/18  Yes Nat Christen, MD  busPIRone (BUSPAR) 10 MG tablet Take 10 mg by mouth 2 (two) times daily.  11/03/18  Yes [provider]  clopidogrel (PLAVIX) 75 MG tablet Take 1 tablet (75 mg total) by mouth daily. 08/14/19  Yes Satira Sark, MD  DOK 100 MG capsule Take 100 mg by mouth daily. 11/19/19  Yes [provider]  doxazosin (CARDURA) 1 MG tablet Take 1 mg by mouth daily. 11/19/19  Yes [provider]  dutasteride (AVODART) 0.5 MG capsule Take 0.5 mg by mouth. In the afternoon   Yes [provider]  escitalopram (LEXAPRO) 20 MG tablet Take 20 mg by mouth every morning.    Yes [provider]  furosemide (LASIX) 20 MG tablet Take 20 mg by mouth 2 (two) times daily.  12/03/16  Yes [provider]  gabapentin (NEURONTIN) 300 MG capsule Take 300 mg by mouth 2 (two) times daily.   Yes [provider]  hydrOXYzine (ATARAX/VISTARIL) 25 MG tablet Take 25 mg by mouth 4 (four) times daily as needed for anxiety.  11/19/19  Yes [provider]  isosorbide mononitrate (IMDUR) 30 MG 24 hr tablet Take 60 mg ( 2 tablets)  Am, and 30 mg ( 1 tablet) in the afternoon Patient taking differently: Take 30 mg by mouth every morning.  10/03/18  Yes Satira Sark, MD  levothyroxine (SYNTHROID, LEVOTHROID) 50 MCG tablet Take 50 mcg by mouth every morning.  10/04/16  Yes [provider]  memantine (NAMENDA) 10 MG tablet Take 10 mg by mouth 2 (two) times daily.  03/05/16  Yes [provider]  nitroGLYCERIN (NITROSTAT) 0.4 MG SL tablet PLACE ONE TABLET UNDER TONGUE EVERY 5 MIN UP TO 3 DOSES AS NEEDED FORCHEST PAIN. Patient taking differently: Place 0.4 mg under the tongue every 5 (five) minutes as needed for chest pain. PLACE ONE TABLET UNDER TONGUE EVERY 5 MIN UP TO 3 DOSES AS NEEDED FORCHEST PAIN. 08/02/19  Yes Satira Sark, MD  oxycodone (ROXICODONE) 30 MG immediate release tablet Take 30 mg by mouth every 4 (four) hours as needed for pain. Take 1 tablet by mouth every 4 to 6 hours as needed for pain.  Max 8 /24 hours. 12/09/16  Yes [provider]  pantoprazole (PROTONIX) 40 MG tablet  Take 1 tablet (40 mg total) by mouth daily. Patient taking differently: Take 40 mg by mouth every morning.  11/09/18  Yes Barton Dubois, MD  pilocarpine (SALAGEN) 5 MG tablet Take 5 mg by mouth 2 (two) times daily.   Yes [provider]  potassium chloride SA (K-DUR,KLOR-CON) 20 MEQ tablet Take 20 mEq by mouth daily.    Yes [provider]  pravastatin (PRAVACHOL) 40 MG tablet TAKE (1) TABLET BY MOUTH AT BEDTIME. Patient taking differently: Take 40 mg by mouth at bedtime.  11/08/18  Yes Barton Dubois, MD  QUEtiapine (SEROQUEL) 25 MG tablet Take 25 mg by mouth at bedtime. 11/19/19  Yes [provider]  tamsulosin (FLOMAX) 0.4 MG CAPS capsule Take 0.4 mg by mouth daily. In the afternoon 02/23/16  Yes [provider]    Physical Exam: Vitals:   12/07/19 1830 12/07/19 1930 12/07/19 2110 12/07/19 2111  BP: 132/67 (!) 130/57  (!) 120/41  Pulse: 71 61  65  Resp: 14 10  16   Temp:    97.6 F (36.4 C)  TempSrc:    Oral  SpO2: 92% 96%  97%  Weight:   96.3 kg   Height:        Constitutional: NAD, calm,  comfortable Vitals:   12/07/19 1830 12/07/19 1930 12/07/19 2110 12/07/19 2111  BP: 132/67 (!) 130/57  (!) 120/41  Pulse: 71 61  65  Resp: 14 10  16   Temp:    97.6 F (36.4 C)  TempSrc:    Oral  SpO2: 92% 96%  97%  Weight:   96.3 kg   Height:       Eyes: PERRL, lids and conjunctivae normal ENMT: Mucous membranes are moist. Posterior pharynx clear of any exudate or lesions.Normal dentition.  Neck: normal, supple, no masses, no thyromegaly Respiratory: Diffuse coarse expiratory wheezing bilaterally, no crackles, Normal respiratory effort. No accessory muscle use.  Cardiovascular: Regular rate and rhythm, no murmurs / rubs / gallops. No extremity edema. 2+ pedal pulses.  Abdomen: Abdomen full, midline hernia, no tenderness, no masses palpated. No hepatosplenomegaly.  Musculoskeletal: no clubbing / cyanosis. No joint deformity upper and lower extremities. Good ROM, no contractures. Normal muscle tone.  Skin: no rashes, lesions, ulcers. No induration Neurologic: CN 2-12 grossly intact. Strength 5/5 in all 4.  Psychiatric: Normal judgment and insight. Alert and oriented x 3. Normal mood.   Labs on Admission: I have personally reviewed following labs and imaging studies  CBC: Recent Labs  Lab 12/07/19 1557  WBC 10.0  NEUTROABS 7.4  HGB 12.5*  HCT 39.2  MCV 96.8  PLT [id number]   Basic Metabolic Panel: Recent Labs  Lab 12/07/19 1557  NA 137  K 3.8  CL 98  CO2 29  GLUCOSE 120*  BUN 23  CREATININE 1.46*  CALCIUM 8.8*    Liver Function Tests: Recent Labs  Lab 12/07/19 1557  AST 28  ALT 21  ALKPHOS 57  BILITOT 1.0  PROT 7.1  ALBUMIN 4.1   No results for input(s): LIPASE, AMYLASE in the last 168 hours. Recent Labs  Lab 12/07/19 1557  AMMONIA 22   Thyroid Function Tests: Recent Labs    12/07/19 1557  TSH 6.201*   Urine analysis:    Component Value Date/Time   COLORURINE YELLOW 12/07/2019 1931   APPEARANCEUR CLEAR 12/07/2019 1931   LABSPEC 1.012 12/07/2019 1931    PHURINE 5.0 12/07/2019 1931   GLUCOSEU NEGATIVE 12/07/2019 1931   HGBUR NEGATIVE 12/07/2019 1931  BILIRUBINUR NEGATIVE 12/07/2019 [place] 12/07/2019 1931   PROTEINUR NEGATIVE 12/07/2019 1931   UROBILINOGEN 0.2 01/26/2015 2247   NITRITE NEGATIVE 12/07/2019 1931   LEUKOCYTESUR NEGATIVE 12/07/2019 1931    Radiological Exams on Admission: DG Chest Port 1 View  Result Date: 12/07/2019 CLINICAL DATA:  Per triage note: Family member states pt has had altered loc for the last couple days and has been unable to void; pt has audible wheezing EXAM: PORTABLE CHEST 1 VIEW COMPARISON:  10/01/2019 and earlier studies. FINDINGS: Stable changes from prior CABG surgery. Cardiac silhouette is top-normal in size. No mediastinal or hilar masses. Lungs are clear.  No pleural effusion or pneumothorax. Skeletal structures are grossly intact. IMPRESSION: No acute cardiopulmonary disease. Electronically Signed   By: Lajean Manes M.D.   On: 12/07/2019 16:47    EKG: Independently reviewed.  Sinus rhythm, incomplete left bundle branch block.  T wave inversions to leads II, III, aVF, V4 through V6, depending on comparison EKG is old or new.  Assessment/Plan Active Problems:   COPD exacerbation (HCC)    COPD exacerbation with acute hypoxic respiratory failure- dyspnea, productive cough, wheezing.  O2 sats percent on room air currently on 2 L nasal cannula sats greater than 92%.  POC COVID-19 test negative.  Chest x-ray without acute abnormality.  -IV Solu-Medrol 125 mg given in ED, continue 60mg  Q12H -DuoNeb scheduled, as needed -Mucolytic's, supplemental O2 -IV Azithromycin 500 mg daily  Metabolic encephalopathy- currently back to baseline, with history of Alzheimer's dementia.  Normal ammonia.  Doubt infectious etiology at this time, possibly attributable to mild acute kidney injury and mild hypoxia. -Hydrate -Resume home memantine, Lexapro, Seroquel, buspirone  Acute kidney  injury-creatinine 1.4, baseline 0.7-1.  Likely prerenal in the setting of Lasix use. - N/s 100cc/hr x 1 day -BMP a.m. -Hold home Lasix  History of coronary artery disease, left bundle branch block-follows with Dr. Domenic Polite. Multivessel CAD status post CABG and subsequent DES intervention to the SVG to PDA as of 2012. Hs troponin x2-  9 >8, EKG with T wave changes to inferior and lateral leads.  No chest pain. -Repeat EKG in a.m. -Resume home Plavix, statins  Diastolic CHF-stable and compensated.  With mild AKI.  Last echo 10/2018 EF 55 to 60%, G2 DD.  -Hold home Lasix for now -Resume home Plavix, Imdur,  Alzheimer's, depression-  -Resume home memantine, Lexapro, Seroquel, buspirone  BPH, in ability to avoid-in and out cath yielded 500 mils of urine.  -Resume home finasteride and tamsulosin (medication list also includes duplicate medication doxazosin held for now).  Hypertension-stable. -Resume home Imdur, tamsulosin,  Hypothyroidism-TSH elevated at 6.2 -Resume home Synthroid -Follow-up as outpatient  DVT prophylaxis: Lovenox Code Status: Full code Family Communication: Plan of care explained to patient voiced understanding, no family at bedside. Disposition Plan: 1 to 2 days Consults called: None Admission status: Observation, telemetry   Bethena Roys MD [name]  12/07/2019, 9:57 PM

## 2019-12-07 NOTE — ED Triage Notes (Signed)
Family member states pt has had altered loc for the last couple days and has been unable to void; pt has audible wheezing

## 2019-12-07 NOTE — Patient Outreach (Signed)
[facility]) Care Management  12/07/2019  Dustin [name] 1934-02-08 [id number]   Case reviewed, no patient outreach needed,  and case closed per Bary Castilla, Assistant Clinical Director at [place]  request.   Colbert Coyer. Annia Friendly, BSN, [facility] Telephonic CM Phone: [phone] Fax: [phone]

## 2019-12-08 LAB — BASIC METABOLIC PANEL
Anion gap: 11 (ref 5–15)
BUN: 22 mg/dL (ref 8–23)
CO2: 26 mmol/L (ref 22–32)
Calcium: 8.7 mg/dL — ABNORMAL LOW (ref 8.9–10.3)
Chloride: 102 mmol/L (ref 98–111)
Creatinine, Ser: 0.92 mg/dL (ref 0.61–1.24)
GFR calc Af Amer: >60 mL/min (ref >60)
GFR calc non Af Amer: >60 mL/min (ref >60)
Glucose, Bld: 170 mg/dL — ABNORMAL HIGH (ref 70–99)
Potassium: 3.8 mmol/L (ref 3.5–5.1)
Sodium: 139 mmol/L (ref 135–145)

## 2019-12-08 LAB — SARS CORONAVIRUS 2 (TAT 6-24 HRS): SARS Coronavirus 2: NEGATIVE

## 2019-12-08 LAB — GLUCOSE, CAPILLARY: Glucose-Capillary: 161 mg/dL — ABNORMAL HIGH (ref 70–99)

## 2019-12-08 MED ORDER — PREDNISONE 20 MG PO TABS: 40.0000 mg | ORAL_TABLET | Freq: Every day | ORAL | 0 refills | Status: DC

## 2019-12-08 MED ORDER — TAMSULOSIN HCL 0.4 MG PO CAPS: 0.4000 mg | ORAL_CAPSULE | Freq: Every day | ORAL | 3 refills | Status: DC

## 2019-12-08 MED ORDER — AZITHROMYCIN 500 MG PO TABS: 500.0000 mg | ORAL_TABLET | Freq: Every day | ORAL | 0 refills | Status: DC

## 2019-12-08 MED ORDER — ALBUTEROL SULFATE (2.5 MG/3ML) 0.083% IN NEBU: 2.5000 mg | INHALATION_SOLUTION | Freq: Four times a day (QID) | RESPIRATORY_TRACT | Status: DC | PRN

## 2019-12-08 MED ORDER — ISOSORBIDE MONONITRATE ER 30 MG PO TB24: 30.0000 mg | ORAL_TABLET | Freq: Every morning | ORAL | Status: DC

## 2019-12-08 MED ORDER — PRAVASTATIN SODIUM 40 MG PO TABS: 40.0000 mg | ORAL_TABLET | Freq: Every day | ORAL | Status: DC

## 2019-12-08 MED ORDER — NITROGLYCERIN 0.4 MG SL SUBL
0.4000 mg | SUBLINGUAL_TABLET | SUBLINGUAL | Status: DC | PRN
  Administered 2019-12-08: 0.4 mg via SUBLINGUAL
  Filled 2019-12-08: qty 1

## 2019-12-08 MED ORDER — HYDROXYZINE HCL 25 MG PO TABS
25.0000 mg | ORAL_TABLET | Freq: Four times a day (QID) | ORAL | Status: DC | PRN
  Administered 2019-12-08: 25 mg via ORAL
  Filled 2019-12-08: qty 1

## 2019-12-08 MED ORDER — OXYCODONE HCL 5 MG PO TABS
15.0000 mg | ORAL_TABLET | Freq: Three times a day (TID) | ORAL | Status: DC | PRN
  Administered 2019-12-08: 15 mg via ORAL
  Filled 2019-12-08: qty 3

## 2019-12-08 MED ORDER — ACETAMINOPHEN 500 MG PO TABS
1000.0000 mg | ORAL_TABLET | Freq: Three times a day (TID) | ORAL | Status: DC
  Administered 2019-12-08: 1000 mg via ORAL
  Filled 2019-12-08: qty 2

## 2019-12-08 MED ORDER — PREDNISONE 20 MG PO TABS: 40.0000 mg | ORAL_TABLET | Freq: Every day | ORAL | Status: DC

## 2019-12-08 MED ORDER — PANTOPRAZOLE SODIUM 40 MG PO TBEC: 40.0000 mg | DELAYED_RELEASE_TABLET | Freq: Every morning | ORAL | Status: DC

## 2019-12-08 MED ORDER — NITROGLYCERIN 0.4 MG SL SUBL: 0.4000 mg | SUBLINGUAL_TABLET | SUBLINGUAL | Status: DC | PRN

## 2019-12-08 NOTE — Discharge Summary (Signed)
Physician Discharge Summary   Dustin [name]  male DOB: 1934-01-29  [id number]  PCP: [name], MD  Admit date: 12/07/2019 Discharge date:   Admitted From: home Disposition:  home CODE STATUS: Full code  Discharge Instructions    Diet - low sodium heart healthy   Complete by: As directed    Discharge instructions   Complete by: As directed    You may have a mild COPD flare up, and are already improving after some steroids and antibiotic.  You now don't need supplemental oxygen.  Please finish taking azithromycin and prednisone as directed for your COPD flare up (instruction in prescriptions).    I have send in a prescription for Flomax to help you pee better.   Increase activity slowly   Complete by: As directed        Hospital Course:  For full details, please see H&P, progress notes, consult notes and ancillary notes.  Briefly,  Dustin [name] is a 85 y.o. Caucasian male with medical history significant for COPD, hypertension, diastolic CHF, BPH, left bundle branch block, Alzheimer's, obstructive sleep apnea, coronary artery disease. Patient was brought to the ED with reports of altered mental status for 2 days, also reports of patient being unable to void and wheezing.     # COPD exacerbation  # acute hypoxic respiratory failure, resolved Presented with dyspnea, productive cough, wheezing.  O2 sats 88% on room air, improved with 2 L nasal cannula to greater than 92%.  Point-of-care Covid test negative.  Hs troponin 8 > 9.  Normal ammonia level.  Normal BNP 41.  Creatinine mildly elevated at 1.46.  Portable chest x-ray negative for acute cardiopulmonary disease.  IV Solu-Medrol 125 mg x 1 given,  DuoNeb scheduled. Pt was started on IV Azithromycin 500 mg daily.  Due to reported AMS, home opioids were held on presentation, but was resumed the next day.  Pt was upset about not getting his home pain meds, and just wanted to be discharged.  O2 saturation test with  ambulation showed "Patient Saturations on Room Air while Ambulating = 92%", so I discharged pt to finish treatment for COPD exacerbation at home, with azithromycin and prednisone.    AMS, resolved History of Alzheimer's dementia   Hx of depression Pt showed his pill bottle of a prescription for Oxycodone 30 mg q4h PRN, and was upset that it was held on presentation due to the report of his confusion.  Pt also had Gabapentin, Seroquel and Atarax on his med list.  Too much sedative medication could contribute to his AMS and confusion.  Pt also reported difficulty breathing causing lack of sleep, which could also account for confusion during the day.  Also had hypoxia and likely hypercapnia.  Pt didn't want his home meds to be altered, especially his pain meds.  He was discharged on his home regimen of memantine, Lexapro, Seroquel, buspirone.  Acute kidney injury, resolved creatinine 1.46 on presentation, baseline 0.7-1.  Likely prerenal in the setting of Lasix use and poor PO intake.  Pt received MIVF and Cr was 0.92 the next day.  # History of coronary artery disease # left bundle branch block # Multivessel CAD status post CABG and subsequent DES intervention to the SVG to PDA as of 2012.  Pt follows with Dr. Domenic Polite. Hs troponin x2-  9 >8, EKG with T wave changes to inferior and lateral leads.  No chest pain.  Continued home Plavix, statins.  Diastolic CHF-stable and compensated  Normal BNP 41 on presentation.  Last echo 10/2018 EF 55 to 60%, G2 DD.  Home Lasix held on presentation 2/2 AKI and resumed after discharge.  Continued home Plavix, Imdur.  BPH In ability to avoid-resolved In the ED, in and out cath yielded 500 mils of urine. Pt was continued on  home finasteride and tamsulosin.  Pt was able to void on his own prior to discharge.  Hypertension-stable. Continued home Imdur, lasix.  Hypothyroidism TSH 6.2 which is considered wnl for elderly.  Continued home  Synthroid.   Discharge Diagnoses:  Active Problems:   COPD exacerbation [facility])    Discharge Instructions:  Allergies as of 12/08/2019   No Known Allergies     Medication List    STOP taking these medications   isosorbide mononitrate 30 MG 24 hr tablet Commonly known as: IMDUR   pravastatin 40 MG tablet Commonly known as: PRAVACHOL     TAKE these medications   albuterol (2.5 MG/3ML) 0.083% nebulizer solution Commonly known as: PROVENTIL Take 3 mLs (2.5 mg total) by nebulization every 6 (six) hours as needed for wheezing or shortness of breath. Patient will also need the nebulizer machine with this prescription.   Avodart 0.5 MG capsule Generic drug: dutasteride Take 0.5 mg by mouth. In the afternoon   busPIRone 10 MG tablet Commonly known as: BUSPAR Take 10 mg by mouth 2 (two) times daily.   clopidogrel 75 MG tablet Commonly known as: PLAVIX Take 1 tablet (75 mg total) by mouth daily.   DOK 100 MG capsule Generic drug: docusate sodium Take 100 mg by mouth daily.   doxazosin 1 MG tablet Commonly known as: CARDURA Take 1 mg by mouth daily.   escitalopram 20 MG tablet Commonly known as: LEXAPRO Take 20 mg by mouth every morning.   furosemide 20 MG tablet Commonly known as: LASIX Take 20 mg by mouth 2 (two) times daily.   gabapentin 300 MG capsule Commonly known as: NEURONTIN Take 300 mg by mouth 2 (two) times daily.   hydrOXYzine 25 MG tablet Commonly known as: ATARAX/VISTARIL Take 25 mg by mouth 4 (four) times daily as needed for anxiety.   levothyroxine 50 MCG tablet Commonly known as: SYNTHROID Take 50 mcg by mouth every morning.   memantine 10 MG tablet Commonly known as: NAMENDA Take 10 mg by mouth 2 (two) times daily.   nitroGLYCERIN 0.4 MG SL tablet Commonly known as: NITROSTAT Place 1 tablet (0.4 mg total) under the tongue every 5 (five) minutes as needed for chest pain. PLACE ONE TABLET UNDER TONGUE EVERY 5 MIN UP TO 3 DOSES AS NEEDED  FORCHEST PAIN.   oxycodone 30 MG immediate release tablet Commonly known as: ROXICODONE Take 30 mg by mouth every 4 (four) hours as needed for pain. Take 1 tablet by mouth every 4 to 6 hours as needed for pain.  Max 8 /24 hours.   pantoprazole 40 MG tablet Commonly known as: PROTONIX Take 1 tablet (40 mg total) by mouth every morning.   pilocarpine 5 MG tablet Commonly known as: SALAGEN Take 5 mg by mouth 2 (two) times daily.   potassium chloride SA 20 MEQ tablet Commonly known as: KLOR-CON Take 20 mEq by mouth daily.   QUEtiapine 25 MG tablet Commonly known as: SEROQUEL Take 25 mg by mouth at bedtime.   tamsulosin 0.4 MG Caps capsule Commonly known as: FLOMAX Take 1 capsule (0.4 mg total) by mouth daily. To help you pee. What changed: additional instructions  No Known Allergies   The results of significant diagnostics from this hospitalization (including imaging, microbiology, ancillary and laboratory) are listed below for reference.   Consultations:   Procedures/Studies: DG Chest 2 View  Result Date: 12/09/2019 CLINICAL DATA:  Chest pain for 2-3 months EXAM: CHEST - 2 VIEW COMPARISON:  12/07/2019 FINDINGS: Cardiac shadow is again enlarged but stable. Postsurgical changes are noted. Aortic calcifications are seen. The lungs are mildly hyperinflated with bibasilar atelectatic changes. No sizable effusion is noted. No focal infiltrate is seen. IMPRESSION: Mild bibasilar atelectasis new from the prior exam. Electronically Signed   By: Inez Catalina M.D.   On: 12/09/2019 20:16   DG Abd 1 View  Result Date: 12/10/2019 CLINICAL DATA:  Constipation. EXAM: ABDOMEN - 1 VIEW COMPARISON:  January 22, 2013 FINDINGS: The bowel gas pattern is normal. No radio-opaque calculi or other significant radiographic abnormality are seen. IMPRESSION: Negative. Electronically Signed   By: Constance Holster M.D.   On: 12/10/2019 00:53   CARDIAC CATHETERIZATION  Result Date:  12/10/2019  Origin lesion is 100% stenosed.  Mid Graft to Dist Graft lesion is 100% stenosed.  Mid RCA lesion is 100% stenosed.  Prox Graft lesion is 100% stenosed.  Mid LAD lesion is 99% stenosed.  Prox Cx to Mid Cx lesion is 100% stenosed.  SVG graft was visualized by angiography.  SVG graft was visualized by angiography.  Origin to Prox Graft lesion is 100% stenosed.  SVG graft was visualized by angiography.  LIMA graft was visualized by angiography.  Ost LM to Mid LM lesion is 90% stenosed.  A drug-eluting stent was successfully placed using a STENT RESOLUTE ONYX 4.5X18.  Post intervention, there is a 0% residual stenosis.  1. Severe triple vessel CAD s/p 5V CABG with 2/5 patent bypass grafts. 2. The left main artery has a moderate ostial stenosis and a severe mid stenosis. 3. The LAD has severe proximal to mid stenosis beyond the first large caliber diagonal branch. The LIMA to the LAD is patent. The vein graft to the second diagonal branch is occluded. The second diagonal branch fills from flow through the LIMA to the LAD. The first diagonal branch is not protected by the LIMA graft. 4. The ramus intermediate branch is a moderate caliber vessel and is not protected by grafts. 5. The Circumflex is a moderate caliber vessel with chronic mid occlusion. The sequential vein graft to OM1 and OM2 is patent to OM1 but the sequential segment of the graft to OM2 is occluded. OM2 fills from left to left collaterals. 6. The RCA is a large caliber vessel with chronic mid occlusion. The vein graft to the PDA is now occluded. The distal RCA and it's branches fill from left to right collaterals supplied through the LIMA to the LAD. 7. Successful PTCA/DES x 1 ostial to to distal left main artery. Recommendations: Continue ASA and Plavix for lifetime given the left main stenting. Continue statin and Imdur. Anticipate discharge home tomorrow.   DG Chest Port 1 View  Result Date: 12/07/2019 CLINICAL DATA:  Per  triage note: Family member states pt has had altered loc for the last couple days and has been unable to void; pt has audible wheezing EXAM: PORTABLE CHEST 1 VIEW COMPARISON:  10/01/2019 and earlier studies. FINDINGS: Stable changes from prior CABG surgery. Cardiac silhouette is top-normal in size. No mediastinal or hilar masses. Lungs are clear.  No pleural effusion or pneumothorax. Skeletal structures are grossly intact. IMPRESSION: No acute cardiopulmonary disease. Electronically  Signed   By: Lajean Manes M.D.   On: 12/07/2019 16:47   ECHOCARDIOGRAM COMPLETE  Result Date: 12/10/2019   ECHOCARDIOGRAM REPORT   Patient Name:   Dustin [name] Date of Exam: 12/10/2019 Medical Rec #:  [id number]       Height:       69.0 in Accession #:    [id number]      Weight:       210.0 lb Date of Birth:  1934/02/20       BSA:          2.11 m Patient Age:    37 years        BP:           120/69 mmHg Patient Gender: M               HR:           54 bpm. Exam Location:  Inpatient Procedure: 2D Echo Indications:    Chest Pain R07.9  History:        Patient has prior history of Echocardiogram examinations, most                 recent 11/08/2018. CAD and Previous Myocardial Infarction, Prior                 CABG, Arrythmias:LBBB; Risk Factors:Hypertension and                 Dyslipidemia.  Sonographer:    Mikki Santee RDCS (AE) Referring Phys: [place]  1. Left ventricular ejection fraction, by visual estimation, is 55 to 60%. The left ventricle has normal function. There is no left ventricular hypertrophy.  2. Left ventricular diastolic parameters are consistent with Grade I diastolic dysfunction (impaired relaxation).  3. Severely dilated left ventricular internal cavity size.  4. The left ventricle has no regional wall motion abnormalities.  5. No change in LVEF from echo done in 2019.  6. Global right ventricle has normal systolic function.The right ventricular size is normal. No increase in  right ventricular wall thickness.  7. Left atrial size was mildly dilated.  8. Right atrial size was mildly dilated.  9. The mitral valve is normal in structure. Mild mitral valve regurgitation. 10. The tricuspid valve is grossly normal. Tricuspid valve regurgitation is trivial. 11. The aortic valve is tricuspid. Aortic valve regurgitation is not visualized. Mild aortic valve sclerosis without stenosis. 12. The pulmonic valve was grossly normal. Pulmonic valve regurgitation is trivial. 13. Normal pulmonary artery systolic pressure. 14. The interatrial septum was not assessed. FINDINGS  Left Ventricle: Left ventricular ejection fraction, by visual estimation, is 55 to 60%. The left ventricle has normal function. The left ventricle has no regional wall motion abnormalities. The left ventricular internal cavity size was severely dilated left ventricle. There is no left ventricular hypertrophy. Left ventricular diastolic parameters are consistent with Grade I diastolic dysfunction (impaired relaxation). No change in LVEF from echo done in 2019. Right Ventricle: The right ventricular size is normal. No increase in right ventricular wall thickness. Global RV systolic function is has normal systolic function. The tricuspid regurgitant velocity is 2.23 m/s, and with an assumed right atrial pressure  of 3 mmHg, the estimated right ventricular systolic pressure is normal at 22.9 mmHg. Left Atrium: Left atrial size was mildly dilated. Right Atrium: Right atrial size was mildly dilated Pericardium: There is no evidence of pericardial effusion. Mitral Valve: The mitral valve is normal in structure. Mild mitral  valve regurgitation. Tricuspid Valve: The tricuspid valve is grossly normal. Tricuspid valve regurgitation is trivial. Aortic Valve: The aortic valve is tricuspid. Aortic valve regurgitation is not visualized. Mild aortic valve sclerosis is present, with no evidence of aortic valve stenosis. Pulmonic Valve: The pulmonic  valve was grossly normal. Pulmonic valve regurgitation is trivial. Pulmonic regurgitation is trivial. Aorta: The aortic root is normal in size and structure. Venous: The inferior vena cava was not well visualized. IAS/Shunts: The interatrial septum was not assessed.  LEFT VENTRICLE PLAX 2D LVIDd:         6.11 cm  Diastology LVIDs:         4.00 cm  LV e' lateral:   9.79 cm/s LV PW:         1.03 cm  LV E/e' lateral: 9.8 LV IVS:        1.04 cm  LV e' medial:    6.96 cm/s LVOT diam:     2.40 cm  LV E/e' medial:  13.8 LV SV:         118 ml LV SV Index:   53.93 LVOT Area:     4.52 cm  RIGHT VENTRICLE RV S prime:     9.36 cm/s TAPSE (M-mode): 1.4 cm LEFT ATRIUM             Index       RIGHT ATRIUM           Index LA diam:        4.60 cm 2.18 cm/m  RA Area:     23.90 cm LA Vol (A2C):   72.1 ml 34.18 ml/m RA Volume:   69.40 ml  32.90 ml/m LA Vol (A4C):   52.3 ml 24.80 ml/m LA Biplane Vol: 61.5 ml 29.16 ml/m  AORTIC VALVE LVOT Vmax:   88.60 cm/s LVOT Vmean:  53.800 cm/s LVOT VTI:    0.198 m  AORTA Ao Root diam: 3.30 cm MITRAL VALVE                         TRICUSPID VALVE MV Area (PHT): 3.53 cm              TR Peak grad:   19.9 mmHg MV PHT:        62.35 msec            TR Vmax:        223.00 cm/s MV Decel Time: 215 msec MR Peak grad: 92.2 mmHg              SHUNTS MR Vmax:      480.00 cm/s            Systemic VTI:  0.20 m MV E velocity: 96.10 cm/s  103 cm/s  Systemic Diam: 2.40 cm MV A velocity: 115.00 cm/s 70.3 cm/s MV E/A ratio:  0.84        1.5  Dorris Carnes MD Electronically signed by Dorris Carnes MD Signature Date/Time: 12/10/2019/11:36:54 AM    Final       Labs: BNP (last 3 results) Recent Labs    12/07/19 1557  BNP [id number]   Basic Metabolic Panel: Recent Labs  Lab 12/08/19 0637 12/09/19 1917 12/10/19 0357 12/11/19 0438  NA 139 140 141 142  K 3.8 3.7 3.4* 3.7  CL 102 102 101 105  CO2 26 30 28 28   GLUCOSE 170* 127* 112* 124*  BUN 22 18 15 14   CREATININE 0.92 0.98 0.94 0.96  CALCIUM 8.7* 9.1 9.1  8.6*  MG  --   --  1.9  --   PHOS  --   --  2.6  --    Liver Function Tests: Recent Labs  Lab 12/10/19 0357  AST 26  ALT 23  ALKPHOS 49  BILITOT 1.1  PROT 6.5  ALBUMIN 3.8   No results for input(s): LIPASE, AMYLASE in the last 168 hours. No results for input(s): AMMONIA in the last 168 hours. CBC: Recent Labs  Lab 12/09/19 1917 12/10/19 0357 12/11/19 0438  WBC 13.7* 10.0 7.9  NEUTROABS  --  6.8  --   HGB 12.0* 13.2 12.0*  HCT 37.9* 40.6 35.8*  MCV 95.7 94.9 92.0  PLT 245 252 235   Cardiac Enzymes: No results for input(s): CKTOTAL, CKMB, CKMBINDEX, TROPONINI in the last 168 hours. BNP: Invalid input(s): POCBNP CBG: Recent Labs  Lab 12/08/19 0507  GLUCAP 161*   D-Dimer No results for input(s): DDIMER in the last 72 hours. Hgb A1c No results for input(s): HGBA1C in the last 72 hours. Lipid Profile No results for input(s): CHOL, HDL, LDLCALC, TRIG, CHOLHDL, LDLDIRECT in the last 72 hours. Thyroid function studies No results for input(s): TSH, T4TOTAL, T3FREE, THYROIDAB in the last 72 hours.  Invalid input(s): FREET3 Anemia work up No results for input(s): VITAMINB12, FOLATE, FERRITIN, TIBC, IRON, RETICCTPCT in the last 72 hours. Urinalysis    Component Value Date/Time   COLORURINE YELLOW 12/07/2019 1931   APPEARANCEUR CLEAR 12/07/2019 1931   LABSPEC 1.012 12/07/2019 1931   PHURINE 5.0 12/07/2019 1931   GLUCOSEU NEGATIVE 12/07/2019 1931   HGBUR NEGATIVE 12/07/2019 1931   BILIRUBINUR NEGATIVE 12/07/2019 1931   KETONESUR NEGATIVE 12/07/2019 1931   PROTEINUR NEGATIVE 12/07/2019 1931   UROBILINOGEN 0.2 01/26/2015 2247   NITRITE NEGATIVE 12/07/2019 1931   LEUKOCYTESUR NEGATIVE 12/07/2019 1931   Sepsis Labs Invalid input(s): PROCALCITONIN,  WBC,  LACTICIDVEN Microbiology Recent Results (from the past 240 hour(s))  Urine culture     Status: Abnormal   Collection Time: 12/07/19  7:31 PM   Specimen: Urine, Clean Catch  Result Value Ref Range Status    Specimen Description   Final    URINE, CLEAN CATCH Performed at [facility], [address] [place], [place] [address]    Special Requests   Final    Normal Performed at [facility], [address], [place], [place] [address]    Culture (A)  Final    <10,000 COLONIES/mL INSIGNIFICANT GROWTH Performed at [facility], [place] [address] [place], [place] [address]    Report Status 12/09/2019 FINAL  Final  SARS CORONAVIRUS 2 (TAT 6-24 HRS) Nasopharyngeal Nasopharyngeal Swab     Status: None   Collection Time: 12/07/19  7:46 PM   Specimen: Nasopharyngeal Swab  Result Value Ref Range Status   SARS Coronavirus 2 NEGATIVE NEGATIVE Final    Comment: (NOTE) SARS-CoV-2 target nucleic acids are NOT DETECTED. The SARS-CoV-2 RNA is generally detectable in upper and lower respiratory specimens during the acute phase of infection. Negative results do not preclude SARS-CoV-2 infection, do not rule out co-infections with other pathogens, and should not be used as the sole basis for treatment or other patient management decisions. Negative results must be combined with clinical observations, patient history, and epidemiological information. The expected result is Negative. Fact Sheet for Patients: SugarRoll.be Fact Sheet for Healthcare Providers: https://www.woods-mathews.com/ This test is not yet approved or cleared by the [place] and  has been authorized  for detection and/or diagnosis of SARS-CoV-2 by FDA under an Emergency Use Authorization (EUA). This EUA will remain  in effect (meaning this test can be used) for the duration of the COVID-19 declaration under Section 56 4(b)(1) of the Act, 21 U.S.C. section 360bbb-3(b)(1), unless the authorization is terminated or revoked sooner. Performed at [facility], [place] [address] [place], [place] [address]   SARS CORONAVIRUS 2 (TAT 6-24 HRS) Nasopharyngeal Nasopharyngeal Swab     Status:  None   Collection Time: 12/09/19 10:48 PM   Specimen: Nasopharyngeal Swab  Result Value Ref Range Status   SARS Coronavirus 2 NEGATIVE NEGATIVE Final    Comment: (NOTE) SARS-CoV-2 target nucleic acids are NOT DETECTED. The SARS-CoV-2 RNA is generally detectable in upper and lower respiratory specimens during the acute phase of infection. Negative results do not preclude SARS-CoV-2 infection, do not rule out co-infections with other pathogens, and should not be used as the sole basis for treatment or other patient management decisions. Negative results must be combined with clinical observations, patient history, and epidemiological information. The expected result is Negative. Fact Sheet for Patients: SugarRoll.be Fact Sheet for Healthcare Providers: https://www.woods-mathews.com/ This test is not yet approved or cleared by the [place] FDA and  has been authorized for detection and/or diagnosis of SARS-CoV-2 by FDA under an Emergency Use Authorization (EUA). This EUA will remain  in effect (meaning this test can be used) for the duration of the COVID-19 declaration under Section 56 4(b)(1) of the Act, 21 U.S.C. section 360bbb-3(b)(1), unless the authorization is terminated or revoked sooner. Performed at [facility], [place] [address], [place], [place] [address]   Respiratory Panel by PCR     Status: None   Collection Time: 12/10/19  1:41 AM   Specimen: Nasopharyngeal Swab; Respiratory  Result Value Ref Range Status   Adenovirus NOT DETECTED NOT DETECTED Final   Coronavirus 229E NOT DETECTED NOT DETECTED Final    Comment: (NOTE) The Coronavirus on the Respiratory Panel, DOES NOT test for the novel  Coronavirus (2019 nCoV)    Coronavirus HKU1 NOT DETECTED NOT DETECTED Final   Coronavirus NL63 NOT DETECTED NOT DETECTED Final   Coronavirus OC43 NOT DETECTED NOT DETECTED Final   Metapneumovirus NOT DETECTED NOT DETECTED Final    Rhinovirus / Enterovirus NOT DETECTED NOT DETECTED Final   Influenza A NOT DETECTED NOT DETECTED Final   Influenza B NOT DETECTED NOT DETECTED Final   Parainfluenza Virus 1 NOT DETECTED NOT DETECTED Final   Parainfluenza Virus 2 NOT DETECTED NOT DETECTED Final   Parainfluenza Virus 3 NOT DETECTED NOT DETECTED Final   Parainfluenza Virus 4 NOT DETECTED NOT DETECTED Final   Respiratory Syncytial Virus NOT DETECTED NOT DETECTED Final   Bordetella pertussis NOT DETECTED NOT DETECTED Final   Chlamydophila pneumoniae NOT DETECTED NOT DETECTED Final   Mycoplasma pneumoniae NOT DETECTED NOT DETECTED Final    Comment: Performed at [facility], [place]. [address], [place], [place] [address]     Total time spend on discharging this patient, including the last patient exam, discussing the hospital stay, instructions for ongoing care as it relates to all pertinent caregivers, as well as preparing the medical discharge records, prescriptions, and/or referrals as applicable, is 50 minutes.    Enzo Bi, MD  [name] 12/14/2019, 8:00 PM  If 7PM-7AM, please contact night-coverage

## 2019-12-08 NOTE — Progress Notes (Signed)
SATURATION QUALIFICATIONS: (This note is used to comply with regulatory documentation for home oxygen)  Patient Saturations on Room Air at Rest = 97%  Patient Saturations on Room Air while Ambulating = 92%   

## 2019-12-08 NOTE — Progress Notes (Signed)
IV removed by [name], NT, patient tolerated well, reviewed AVS with patient and patient's wife, [name], both verbalized understanding.  Patient to be taken to front entrance of hospital by wheelchair and transported home by his wife.

## 2019-12-08 NOTE — Progress Notes (Signed)
Still waiting for 6 to 24 hr covid test for neb clearance.

## 2019-12-09 ENCOUNTER — Other Ambulatory Visit: Payer: Self-pay

## 2019-12-09 ENCOUNTER — Inpatient Hospital Stay (HOSPITAL_COMMUNITY)
Admission: EM | Admit: 2019-12-09 | Discharge: 2019-12-11 | DRG: 247 | Disposition: A | Discharge status: Home / Self Care | Payer: PPO | Attending: Internal Medicine | Admitting: Internal Medicine

## 2019-12-09 ENCOUNTER — Encounter (HOSPITAL_COMMUNITY): Payer: Self-pay | Admitting: *Deleted

## 2019-12-09 ENCOUNTER — Emergency Department (HOSPITAL_COMMUNITY): Payer: PPO

## 2019-12-09 DIAGNOSIS — I252 Old myocardial infarction: Secondary | ICD-10-CM

## 2019-12-09 DIAGNOSIS — Z79891 Long term (current) use of opiate analgesic: Secondary | ICD-10-CM

## 2019-12-09 DIAGNOSIS — I25790 Atherosclerosis of other coronary artery bypass graft(s) with unstable angina pectoris: Secondary | ICD-10-CM | POA: Diagnosis present

## 2019-12-09 DIAGNOSIS — G4733 Obstructive sleep apnea (adult) (pediatric): Secondary | ICD-10-CM | POA: Diagnosis present

## 2019-12-09 DIAGNOSIS — Z87891 Personal history of nicotine dependence: Secondary | ICD-10-CM

## 2019-12-09 DIAGNOSIS — Z8249 Family history of ischemic heart disease and other diseases of the circulatory system: Secondary | ICD-10-CM

## 2019-12-09 DIAGNOSIS — Z79890 Hormone replacement therapy (postmenopausal): Secondary | ICD-10-CM

## 2019-12-09 DIAGNOSIS — R3911 Hesitancy of micturition: Secondary | ICD-10-CM

## 2019-12-09 DIAGNOSIS — I2510 Atherosclerotic heart disease of native coronary artery without angina pectoris: Secondary | ICD-10-CM | POA: Diagnosis present

## 2019-12-09 DIAGNOSIS — Z8619 Personal history of other infectious and parasitic diseases: Secondary | ICD-10-CM

## 2019-12-09 DIAGNOSIS — J9811 Atelectasis: Secondary | ICD-10-CM | POA: Diagnosis present

## 2019-12-09 DIAGNOSIS — I5032 Chronic diastolic (congestive) heart failure: Secondary | ICD-10-CM | POA: Diagnosis present

## 2019-12-09 DIAGNOSIS — N401 Enlarged prostate with lower urinary tract symptoms: Secondary | ICD-10-CM | POA: Diagnosis present

## 2019-12-09 DIAGNOSIS — K5900 Constipation, unspecified: Secondary | ICD-10-CM

## 2019-12-09 DIAGNOSIS — I6523 Occlusion and stenosis of bilateral carotid arteries: Secondary | ICD-10-CM | POA: Diagnosis present

## 2019-12-09 DIAGNOSIS — I447 Left bundle-branch block, unspecified: Secondary | ICD-10-CM | POA: Diagnosis present

## 2019-12-09 DIAGNOSIS — F329 Major depressive disorder, single episode, unspecified: Secondary | ICD-10-CM | POA: Diagnosis present

## 2019-12-09 DIAGNOSIS — Z79899 Other long term (current) drug therapy: Secondary | ICD-10-CM

## 2019-12-09 DIAGNOSIS — K0889 Other specified disorders of teeth and supporting structures: Secondary | ICD-10-CM | POA: Diagnosis present

## 2019-12-09 DIAGNOSIS — J441 Chronic obstructive pulmonary disease with (acute) exacerbation: Secondary | ICD-10-CM | POA: Diagnosis present

## 2019-12-09 DIAGNOSIS — M549 Dorsalgia, unspecified: Secondary | ICD-10-CM | POA: Diagnosis present

## 2019-12-09 DIAGNOSIS — I10 Essential (primary) hypertension: Secondary | ICD-10-CM | POA: Diagnosis present

## 2019-12-09 DIAGNOSIS — Z955 Presence of coronary angioplasty implant and graft: Secondary | ICD-10-CM

## 2019-12-09 DIAGNOSIS — N138 Other obstructive and reflux uropathy: Secondary | ICD-10-CM | POA: Diagnosis present

## 2019-12-09 DIAGNOSIS — E039 Hypothyroidism, unspecified: Secondary | ICD-10-CM | POA: Diagnosis present

## 2019-12-09 DIAGNOSIS — M1990 Unspecified osteoarthritis, unspecified site: Secondary | ICD-10-CM | POA: Diagnosis present

## 2019-12-09 DIAGNOSIS — Z7902 Long term (current) use of antithrombotics/antiplatelets: Secondary | ICD-10-CM

## 2019-12-09 DIAGNOSIS — R079 Chest pain, unspecified: Secondary | ICD-10-CM | POA: Diagnosis present

## 2019-12-09 DIAGNOSIS — I25119 Atherosclerotic heart disease of native coronary artery with unspecified angina pectoris: Secondary | ICD-10-CM

## 2019-12-09 DIAGNOSIS — E782 Mixed hyperlipidemia: Secondary | ICD-10-CM | POA: Diagnosis present

## 2019-12-09 DIAGNOSIS — J449 Chronic obstructive pulmonary disease, unspecified: Secondary | ICD-10-CM | POA: Diagnosis present

## 2019-12-09 DIAGNOSIS — I25110 Atherosclerotic heart disease of native coronary artery with unstable angina pectoris: Principal | ICD-10-CM | POA: Diagnosis present

## 2019-12-09 DIAGNOSIS — I110 Hypertensive heart disease with heart failure: Secondary | ICD-10-CM | POA: Diagnosis present

## 2019-12-09 DIAGNOSIS — F419 Anxiety disorder, unspecified: Secondary | ICD-10-CM | POA: Diagnosis present

## 2019-12-09 DIAGNOSIS — Z8701 Personal history of pneumonia (recurrent): Secondary | ICD-10-CM

## 2019-12-09 DIAGNOSIS — N400 Enlarged prostate without lower urinary tract symptoms: Secondary | ICD-10-CM | POA: Diagnosis present

## 2019-12-09 DIAGNOSIS — F0280 Dementia in other diseases classified elsewhere without behavioral disturbance: Secondary | ICD-10-CM | POA: Diagnosis present

## 2019-12-09 DIAGNOSIS — K5909 Other constipation: Secondary | ICD-10-CM | POA: Diagnosis present

## 2019-12-09 DIAGNOSIS — Z951 Presence of aortocoronary bypass graft: Secondary | ICD-10-CM

## 2019-12-09 DIAGNOSIS — R0902 Hypoxemia: Secondary | ICD-10-CM | POA: Diagnosis present

## 2019-12-09 DIAGNOSIS — E785 Hyperlipidemia, unspecified: Secondary | ICD-10-CM | POA: Diagnosis present

## 2019-12-09 DIAGNOSIS — R338 Other retention of urine: Secondary | ICD-10-CM | POA: Diagnosis present

## 2019-12-09 DIAGNOSIS — Z20828 Contact with and (suspected) exposure to other viral communicable diseases: Secondary | ICD-10-CM | POA: Diagnosis present

## 2019-12-09 DIAGNOSIS — G309 Alzheimer's disease, unspecified: Secondary | ICD-10-CM | POA: Diagnosis present

## 2019-12-09 DIAGNOSIS — G8929 Other chronic pain: Secondary | ICD-10-CM | POA: Diagnosis present

## 2019-12-09 LAB — BASIC METABOLIC PANEL
Anion gap: 8 (ref 5–15)
BUN: 18 mg/dL (ref 8–23)
CO2: 30 mmol/L (ref 22–32)
Calcium: 9.1 mg/dL (ref 8.9–10.3)
Chloride: 102 mmol/L (ref 98–111)
Creatinine, Ser: 0.98 mg/dL (ref 0.61–1.24)
GFR calc Af Amer: >60 mL/min (ref >60)
GFR calc non Af Amer: >60 mL/min (ref >60)
Glucose, Bld: 127 mg/dL — ABNORMAL HIGH (ref 70–99)
Potassium: 3.7 mmol/L (ref 3.5–5.1)
Sodium: 140 mmol/L (ref 135–145)

## 2019-12-09 LAB — URINE CULTURE
Culture: <10000 — AB
Special Requests: NORMAL

## 2019-12-09 LAB — TROPONIN I (HIGH SENSITIVITY)
Troponin I (High Sensitivity): 17 ng/L (ref <18)
Troponin I (High Sensitivity): 14 ng/L (ref <18)

## 2019-12-09 LAB — CBC
HCT: 37.9 % — ABNORMAL LOW (ref 39.0–52.0)
Hemoglobin: 12 g/dL — ABNORMAL LOW (ref 13.0–17.0)
MCH: 30.3 pg (ref 26.0–34.0)
MCHC: 31.7 g/dL (ref 30.0–36.0)
MCV: 95.7 fL (ref 80.0–100.0)
Platelets: 245 10*3/uL (ref 150–400)
RBC: 3.96 MIL/uL — ABNORMAL LOW (ref 4.22–5.81)
RDW: 12.9 % (ref 11.5–15.5)
WBC: 13.7 10*3/uL — ABNORMAL HIGH (ref 4.0–10.5)
nRBC: 0 % (ref 0.0–0.2)

## 2019-12-09 MED ORDER — SODIUM CHLORIDE 0.9% FLUSH
3.0000 mL | Freq: Once | INTRAVENOUS | Status: AC
  Administered 2019-12-09: 3 mL via INTRAVENOUS

## 2019-12-09 NOTE — H&P (Addendum)
Dustin [name] [id number] DOB: Jun 01, 1934 DOA: 12/09/2019     PCP: [name], MD   Outpatient Specialists:   CARDS:  Dr. Domenic Polite    Patient arrived to ER on 12/09/19 at [facility]  Patient coming from: home Lives   With family    Chief Complaint:   Chief Complaint  Patient presents with  . Chest Pain    HPI: Dustin [name] is a 85 y.o. male with medical history significant of COPD, hypertension, diastolic CHF, BPH, left bundle branch block, Alzheimer's, obstructive sleep apnea, coronary artery disease    Presented with chest pain he reports he has been having intermittent chest pain chronically he takes nitro few times a week but today his pain was so severe that he was crying and stated it was as bad as when he was a heart attack  He has been having intermittent Chest pain every time he moves He took nitro and it does resolve the pain  Patient had recently been admitted from on 11 December for altered mental status. He was found to have oxygen saturation 88% on room air improved on 2 L his creatinine was mildly elevated 1.46 chest x-ray was unremarkable he was given 1 dose of 125 Solu-Medrol and albuterol inhaler in emergency department  He left yesterday from [facility] bc he did not like it there  He has dementia and lately he has been getting worse he needs help dressing But showers, he knows his family  He has been getting short of breath when he walks and wheezing  He has not been able to urinate with increase pressure  Infectious risk factors:  Reports chest pain,    In  ER RAPID COVID TEST   in house  PCR testing  Pending  Lab Results  Component Value Date   [place] NEGATIVE 12/07/2019    Regarding pertinent Chronic problems:    Hyperlipidemia -  on statins Pravachol   HTN on Avodart Imdur     chronic CHF diastolic  - last echo 0000000: EF range of 55% to 60%. (grade 2 diastolic dysfunction).   Dementia on Namenda   CAD  - On  Aspirin, statin,   Plavix                 - followed by cardiology   Multivessel CAD status post CABG and subsequent DES intervention to the SVG to PDA as of 2012   [place] 12/28/2016:  Inferior defect that shows partial improvement consistent with scar/soft tissue attenuation and periinfarct ischemia  LVEF 46% Intermediate risk study    COPD - not  followed by pulmonology not on baseline oxygen       BPH - on Flomax,    For follow-up of he really wanted to be just keeping readmitted   While in ER: ECG showing LBBB Trop 14 currently chest pain free    The following Work up has been ordered so far:  Orders Placed This Encounter  Procedures  . SARS CORONAVIRUS 2 (TAT 6-24 HRS) Nasopharyngeal Nasopharyngeal Swab  . DG Chest 2 View  . Basic metabolic panel  . CBC  . Cardiac monitoring  . Saline Lock IV, Maintain IV access  . Consult to hospitalist  ALL PATIENTS BEING ADMITTED/HAVING PROCEDURES NEED COVID-19 SCREENING  . Pulse oximetry, continuous  . ED EKG  . EKG 12-Lead     Following Medications were ordered in ER: Medications  sodium chloride flush (NS) 0.9 % injection 3 mL (  3 mLs Intravenous Given 12/09/19 2216)        Consult Orders  (From admission, onward)         Start     Ordered   12/09/19 2241  Consult to hospitalist  ALL PATIENTS BEING ADMITTED/HAVING PROCEDURES NEED COVID-19 SCREENING  Once    Comments: ALL PATIENTS BEING ADMITTED/HAVING PROCEDURES NEED COVID-19 SCREENING  Provider:  (Not yet assigned)  Question Answer Comment  Place call to: [name]   Reason for Consult Admit      12/09/19 2240           Significant initial  Findings: Abnormal Labs Reviewed  BASIC METABOLIC PANEL - Abnormal; Notable for the following components:      Result Value   Glucose, Bld 127 (*)    All other components within normal limits  CBC - Abnormal; Notable for the following components:   WBC 13.7 (*)    RBC 3.96 (*)    Hemoglobin 12.0  (*)    HCT 37.9 (*)    All other components within normal limits    Otherwise labs showing:    Recent Labs  Lab 12/07/19 1557 12/08/19 0637 12/09/19 1917  NA 137 139 140  K 3.8 3.8 3.7  CO2 29 26 30   GLUCOSE 120* 170* 127*  BUN 23 22 18   CREATININE 1.46* 0.92 0.98  CALCIUM 8.8* 8.7* 9.1    Cr    stable,   Lab Results  Component Value Date   CREATININE 0.98 12/09/2019   CREATININE 0.92 12/08/2019   CREATININE 1.46 (H) 12/07/2019    Recent Labs  Lab 12/07/19 1557  AST 28  ALT 21  ALKPHOS 57  BILITOT 1.0  PROT 7.1  ALBUMIN 4.1   Lab Results  Component Value Date   CALCIUM 9.1 12/09/2019     WBC      Component Value Date/Time   WBC 13.7 (H) 12/09/2019 1917   ANC    Component Value Date/Time   NEUTROABS 7.4 12/07/2019 1557   ALC No components found for: LYMPHAB    Plt: Lab Results  Component Value Date   PLT 245 12/09/2019    Lactic Acid, Venous    Component Value Date/Time   LATICACIDVEN 1.2 04/03/2016 1328     COVID-19 Labs  No results for input(s): DDIMER, FERRITIN, LDH, CRP in the last 72 hours.  Lab Results  Component Value Date   SARSCOV2NAA NEGATIVE 12/07/2019     HG/HCT stable,       Component Value Date/Time   HGB 12.0 (L) 12/09/2019 1917   HCT 37.9 (L) 12/09/2019 1917   HCT 43 01/26/2013 0000    No results for input(s): LIPASE, AMYLASE in the last 168 hours. Recent Labs  Lab 12/07/19 1557  AMMONIA 22    No components found for: LABALBU   Troponin 14      ECG: Ordered Personally reviewed by me showing: HR : 59 Rhythm:  , LBBB,   no evidence of ischemic changes QTC 462   BNP (last 3 results) Recent Labs    12/07/19 1557  BNP 41.0    ProBNP (last 3 results) No results for input(s): PROBNP in the last 8760 hours.  DM  labs:  HbA1C: No results for input(s): HGBA1C in the last 8760 hours.     CBG (last 3)  Recent Labs    12/08/19 0507  GLUCAP 161*         UA  no evidence of UTI  Urine  analysis:    Component Value Date/Time   COLORURINE YELLOW 12/07/2019 1931   APPEARANCEUR CLEAR 12/07/2019 1931   LABSPEC 1.012 12/07/2019 1931   PHURINE 5.0 12/07/2019 1931   GLUCOSEU NEGATIVE 12/07/2019 1931   HGBUR NEGATIVE 12/07/2019 1931   BILIRUBINUR NEGATIVE 12/07/2019 1931   KETONESUR NEGATIVE 12/07/2019 1931   PROTEINUR NEGATIVE 12/07/2019 1931   UROBILINOGEN 0.2 01/26/2015 2247   NITRITE NEGATIVE 12/07/2019 1931   LEUKOCYTESUR NEGATIVE 12/07/2019 1931       Ordered    CXR - NON acute bilateral atelectasis    ED Triage Vitals  Enc Vitals Group     BP 12/09/19 1919 (!) 149/66     Pulse Rate 12/09/19 1919 65     Resp 12/09/19 1919 14     Temp 12/09/19 1919 98.1 F (36.7 C)     Temp Source 12/09/19 1919 Oral     SpO2 12/09/19 1919 93 %     Weight 12/09/19 1954 210 lb (95.3 kg)     Height 12/09/19 1954 5\' 9"  (1.753 m)     Head Circumference --      Peak Flow --      Pain Score 12/09/19 1954 0     Pain Loc --      Pain Edu? --      Excl. in [place]? --   TMAX(24)@       Latest  Blood pressure (!) 153/67, pulse (!) 56, temperature 98.1 F (36.7 C), temperature source Oral, resp. rate 15, height 5\' 9"  (1.753 m), weight 95.3 kg, SpO2 92 %.     Hospitalist was called for admission for chest pain evaluation   Review of Systems:    Pertinent positives include:  chest pain,  Constitutional:  No weight loss, night sweats, Fevers, chills, fatigue, weight loss  HEENT:  No headaches, Difficulty swallowing,Tooth/dental problems,Sore throat,  No sneezing, itching, ear ache, nasal congestion, post nasal drip,  Cardio-vascular:  No Orthopnea, PND, anasarca, dizziness, palpitations.no Bilateral lower extremity swelling  GI:  No heartburn, indigestion, abdominal pain, nausea, vomiting, diarrhea, change in bowel habits, loss of appetite, melena, blood in stool, hematemesis Resp:  no shortness of breath at rest. No dyspnea on exertion, No excess mucus, no productive cough,  No non-productive cough, No coughing up of blood.No change in color of mucus.No wheezing. Skin:  no rash or lesions. No jaundice GU:  no dysuria, change in color of urine, no urgency or frequency. No straining to urinate.  No flank pain.  Musculoskeletal:  No joint pain or no joint swelling. No decreased range of motion. No back pain.  Psych:  No change in mood or affect. No depression or anxiety. No memory loss.  Neuro: no localizing neurological complaints, no tingling, no weakness, no double vision, no gait abnormality, no slurred speech, no confusion  All systems reviewed and apart from [place] all are negative  Past Medical History:   Past Medical History:  Diagnosis Date  . Alzheimer disease ([place])   . Anxiety   . Arthritis   . Atrophic gastritis    a. By EGD 02/2013.  . Carotid artery disease ([place])    a. mild-mod plaque, <50% stenosis bilat by duplex 2018.  [name] Coronary atherosclerosis of native coronary artery    a. Multivessel s/p CABG 1996. b. s/p DES x 2 SVG to PDA 8/12 with distal disease managed medically.  . DDD (degenerative disc disease)    Chronic back pain  . Enlarged prostate   .  Essential hypertension, benign   . Hematuria   . Hypothyroidism   . LBBB (left bundle branch block)   . MI (myocardial infarction) ([place])   . Mixed hyperlipidemia   . OA (osteoarthritis)   . OSA (obstructive sleep apnea)   . Sinus bradycardia    a. Aricept and BB discontinued due to this.      Past Surgical History:  Procedure Laterality Date  . COLONOSCOPY  08/03/2004   [place]-numerous large diverticula in the descending, transverse, descending, and sigmoid colon. Otherwise normal exam.  . COLONOSCOPY  07/12/2012   RMR: External hemorrhoidal tag; multiple rectal and colonic polyps removed and/or treated as described above. Pancolonic diverticulosis. Bx-tubular adenomas, rectal hyperplastic polyp. next colonoscopy in 06/2015.  [name] COLONOSCOPY N/A 06/22/2016   Procedure: COLONOSCOPY;   Surgeon: Aviva Signs, MD;  Location: [facility];  Service: Gastroenterology;  Laterality: N/A;  730  . CORONARY ANGIOPLASTY WITH STENT PLACEMENT    . CORONARY ARTERY BYPASS GRAFT  1996   LIMA to LAD, SVG to D2, SVG to PDA, SVG to OM1 and OM2  . ESOPHAGOGASTRODUODENOSCOPY N/A 03/16/2013   Procedure: ESOPHAGOGASTRODUODENOSCOPY (EGD);  Surgeon: Daneil Dolin, MD;  Location: [facility];  Service: Endoscopy;  Laterality: N/A;  12:00-moved to [place] notified pt  . ESOPHAGOGASTRODUODENOSCOPY N/A 03/06/2013   Procedure: ESOPHAGOGASTRODUODENOSCOPY (EGD);  Surgeon: Daneil Dolin, MD;  Location: [facility];  Service: Endoscopy;  Laterality: N/A;  . HERNIA REPAIR      Social History:  Ambulatory   Independently      reports that he quit smoking about 24 years ago. His smoking use included cigarettes. He has a 80.00 pack-year smoking history. He has never used smokeless tobacco. He reports that he does not drink alcohol or use drugs.     Family History:   Family History  Problem Relation Age of Onset  . Heart disease Other   . Heart attack Mother   . Colon cancer Neg Hx     Allergies: No Known Allergies   Prior to Admission medications   Medication Sig Start Date End Date Taking? Authorizing Provider  albuterol (PROVENTIL) (2.5 MG/3ML) 0.083% nebulizer solution Take 3 mLs (2.5 mg total) by nebulization every 6 (six) hours as needed for wheezing or shortness of breath. Patient will also need the nebulizer machine with this prescription. 10/22/18  Yes Nat Christen, MD  albuterol (VENTOLIN HFA) 108 (90 Base) MCG/ACT inhaler Inhale 2 puffs into the lungs every 6 (six) hours as needed for wheezing or shortness of breath.   Yes [provider]  bisacodyl (DULCOLAX) 5 MG EC tablet Take 5 mg by mouth 2 (two) times a week.   Yes [provider]  busPIRone (BUSPAR) 10 MG tablet Take 10 mg by mouth 2 (two) times daily.  11/03/18  Yes [provider]    clopidogrel (PLAVIX) 75 MG tablet Take 1 tablet (75 mg total) by mouth daily. 08/14/19  Yes Satira Sark, MD  azithromycin (ZITHROMAX) 500 MG tablet Take 1 tablet (500 mg total) by mouth daily for 4 days. This is to treat for your COPD flare up. 12/08/19 12/12/19  Enzo Bi, MD  DOK 100 MG capsule Take 100 mg by mouth daily. 11/19/19   [provider]  doxazosin (CARDURA) 1 MG tablet Take 1 mg by mouth daily. 11/19/19   [provider]  dutasteride (AVODART) 0.5 MG capsule Take 0.5 mg by mouth. In the afternoon    [provider]  escitalopram (LEXAPRO) 20 MG tablet Take 20 mg by mouth every morning.     [provider]  furosemide (LASIX) 20 MG tablet Take 20 mg by mouth 2 (two) times daily.  12/03/16   [provider]  gabapentin (NEURONTIN) 300 MG capsule Take 300 mg by mouth 2 (two) times daily.    [provider]  hydrOXYzine (ATARAX/VISTARIL) 25 MG tablet Take 25 mg by mouth 4 (four) times daily as needed for anxiety.  11/19/19   [provider]  isosorbide mononitrate (IMDUR) 30 MG 24 hr tablet Take 1 tablet (30 mg total) by mouth every morning. 12/08/19   Enzo Bi, MD  levothyroxine (SYNTHROID, LEVOTHROID) 50 MCG tablet Take 50 mcg by mouth every morning.  10/04/16   [provider]  memantine (NAMENDA) 10 MG tablet Take 10 mg by mouth 2 (two) times daily.  03/05/16   [provider]  nitroGLYCERIN (NITROSTAT) 0.4 MG SL tablet Place 1 tablet (0.4 mg total) under the tongue every 5 (five) minutes as needed for chest pain. PLACE ONE TABLET UNDER TONGUE EVERY 5 MIN UP TO 3 DOSES AS NEEDED FORCHEST PAIN. 12/08/19   Enzo Bi, MD  oxycodone (ROXICODONE) 30 MG immediate release tablet Take 30 mg by mouth every 4 (four) hours as needed for pain. Take 1 tablet by mouth every 4 to 6 hours as needed for pain.  Max 8 /24 hours. 12/09/16   [provider]  pantoprazole (PROTONIX) 40 MG tablet Take 1 tablet (40 mg  total) by mouth every morning. 12/08/19   Enzo Bi, MD  pilocarpine (SALAGEN) 5 MG tablet Take 5 mg by mouth 2 (two) times daily.    [provider]  potassium chloride SA (K-DUR,KLOR-CON) 20 MEQ tablet Take 20 mEq by mouth daily.     [provider]  pravastatin (PRAVACHOL) 40 MG tablet Take 1 tablet (40 mg total) by mouth at bedtime. 12/08/19   Enzo Bi, MD  predniSONE (DELTASONE) 20 MG tablet Take 2 tablets (40 mg total) by mouth daily with breakfast for 3 days. This is for your COPD flare up 12/09/19 12/12/19  Enzo Bi, MD  QUEtiapine (SEROQUEL) 25 MG tablet Take 25 mg by mouth at bedtime. 11/19/19   [provider]  tamsulosin (FLOMAX) 0.4 MG CAPS capsule Take 1 capsule (0.4 mg total) by mouth daily. To help you pee. 12/08/19   Enzo Bi, MD   Physical Exam: Blood pressure (!) 153/67, pulse (!) 56, temperature 98.1 F (36.7 C), temperature source Oral, resp. rate 15, height 5\' 9"  (1.753 m), weight 95.3 kg, SpO2 92 %. 1. General:  in No  Acute distress   Chronically ill  -appearing 2. Psychological: Alert and   Oriented 3. Head/ENT:    Dry Mucous Membranes                          Head Non traumatic, neck supple                            Poor Dentition 4. SKIN:   decreased Skin turgor,  Skin clean Dry and intact no rash 5. Heart: Regular rate and rhythm no  Murmur, no Rub or gallop 6. Lungs:   Occasional  Wheezes no crackles   7. Abdomen: Soft,  non-tender, Non distended   bowel sounds present 8. Lower extremities: no clubbing, cyanosis, no  edema 9. Neurologically Grossly intact, moving  all 4 extremities equally  10. MSK: Normal range of motion   All other LABS:     Recent Labs  Lab 12/07/19 1557 12/09/19 1917  WBC 10.0 13.7*  NEUTROABS 7.4  --   HGB 12.5* 12.0*  HCT 39.2 37.9*  MCV 96.8 95.7  PLT 214 245     Recent Labs  Lab 12/07/19 1557 12/08/19 0637 12/09/19 1917  NA 137 139 140  K 3.8 3.8 3.7  CL 98 102 102  CO2 29 26 30     GLUCOSE 120* 170* 127*  BUN 23 22 18   CREATININE 1.46* 0.92 0.98  CALCIUM 8.8* 8.7* 9.1     Recent Labs  Lab 12/07/19 1557  AST 28  ALT 21  ALKPHOS 57  BILITOT 1.0  PROT 7.1  ALBUMIN 4.1      Cultures:    Component Value Date/Time   SDES  12/07/2019 1931    URINE, CLEAN CATCH Performed at [facility], [address] [place], [place] [address]    [facility]  12/07/2019 1931    Normal Performed at [facility], [address], [place], [place] [address]    CULT (A) 12/07/2019 1931    <10,000 COLONIES/mL INSIGNIFICANT GROWTH Performed at [facility] [address] [place], [place] [address]    REPTSTATUS 12/09/2019 FINAL 12/07/2019 1931     Radiological Exams on Admission: DG Chest 2 View  Result Date: 12/09/2019 CLINICAL DATA:  Chest pain for 2-3 months EXAM: CHEST - 2 VIEW COMPARISON:  12/07/2019 FINDINGS: Cardiac shadow is again enlarged but stable. Postsurgical changes are noted. Aortic calcifications are seen. The lungs are mildly hyperinflated with bibasilar atelectatic changes. No sizable effusion is noted. No focal infiltrate is seen. IMPRESSION: Mild bibasilar atelectasis new from the prior exam. Electronically Signed   By: Inez Catalina M.D.   On: 12/09/2019 20:16    Chart has been reviewed    Assessment/Plan   85 y.o. male with medical history significant of COPD, hypertension, diastolic CHF, BPH, left bundle branch block, Alzheimer's, obstructive sleep apnea, coronary artery disease  Admitted for Chest pain evaluation  Present on Admission: . Chest pain -likely multifactorial patient has known history of significant coronary artery disease followed by cardiology.  Also recently admitted for COPD exacerbation but was discharged Early as patient did not want to stay I suspect he may have been still hypoxic with ambulation which has caused worsening chest pain if he has underlying CAD.  Continue to cycle cardiac enzymes monitor on  telemetry Appreciate cardiology,  echogram in the morning Given significant shortness of breath and new hypoxia although most likely secondary to COPD will obtain D-dimer if positive will need CTA to rule out PE  . Hyperlipidemia chronic stable continue home medications  . HYPERTENSION, BENIGN -continue home medications  . Coronary artery disease chronic followed by cardiology currently experiencing chest pain with exertion.  Admit to telemetry chronic cardiac enzymes unremarkable patient is on Plavix and statin will continue appreciate cardiology input  . Chronic diastolic CHF (congestive heart failure) (HCC) chronic currently appears to be somewhat on the dry side we will continue to monitor restart Lasix when able  . BPH (benign prostatic hyperplasia) continue Flomax check postvoid residuals patient per history may have had some issues with urine retention Bladder scan in ER showing 86ml  . COPD (chronic obstructive pulmonary disease) (HCC)-take likely complicated by COPD exacerbation.  Patient was discharged home azithromycin and prednisone taper on not sure if family was able  to fill those medications either.  Family reports he has been having significant wheezing and worsening shortness of breath.  Chest x-ray at this point show no evidence of pneumonia per history patient have had some increased sputum production the past Covid was negative. Will restart steroids continue azithromycin.  Treat for COPD exacerbation.  Urinary retention patient was recently started on Flomax and possibly Lasix.  Wife states he was not able to start the medications because over the weekend.  Will obtain bladder scan, and postvoid residual Other plan as per orders.  DVT prophylaxis:   Lovenox     Code Status:  FULL CODE   as per patient   I had personally discussed CODE STATUS with  Family     Family Communication:   Family not at  Bedside  plan of care was discussed on the phone with   Wife    Disposition Plan:     To home once workup is complete and patient is stable                     Would benefit from PT/OT eval prior to DC  Ordered                                     Consults called: emailed cardiology    Admission status:  ED Disposition    ED Disposition Condition Comment   Admit  The patient appears reasonably stabilized for admission considering the current resources, flow, and capabilities available in the ED at this time, and I doubt any other [facility] requiring further screening and/or treatment in the ED prior to admission is  present.       Obs      Level of care    tele  For  24H     Precautions: admitted as asymptomatic screening protocol  No active isolations    PPE: Used by the provider:   P100  eye Goggles,  Gloves     Imberly Troxler 12/10/2019, 1:17 AM   [name]     after 2 AM please page floor coverage PA If 7AM-7PM, please contact the day team taking care of the patient using [email or web address]

## 2019-12-09 NOTE — ED Triage Notes (Signed)
The pt is c/o chest pain for several months and sl nitro  Has helped the pain until today.  He took a sl nitro on the way here and his chest pain is some better at present

## 2019-12-09 NOTE — ED Notes (Signed)
Dustin [name] [phone] please call daughter in law when and if we have updates

## 2019-12-09 NOTE — ED Notes (Signed)
Family notified that pt was roomed as they wanted to visit

## 2019-12-09 NOTE — ED Notes (Signed)
Daughter in law Armone Drakes [phone] would like a call when patient is roomed to EDP. She will be waiting in the parking lot. Daughter in law also left [name]'s number, Gradie Trumpy [phone].

## 2019-12-10 ENCOUNTER — Observation Stay (HOSPITAL_COMMUNITY): Payer: PPO

## 2019-12-10 ENCOUNTER — Observation Stay (HOSPITAL_BASED_OUTPATIENT_CLINIC_OR_DEPARTMENT_OTHER): Payer: PPO

## 2019-12-10 ENCOUNTER — Encounter (HOSPITAL_COMMUNITY)
Admission: EM | Disposition: A | Discharge status: Home / Self Care | Payer: Self-pay | Source: Home / Self Care | Attending: Internal Medicine

## 2019-12-10 DIAGNOSIS — I110 Hypertensive heart disease with heart failure: Secondary | ICD-10-CM | POA: Diagnosis present

## 2019-12-10 DIAGNOSIS — E782 Mixed hyperlipidemia: Secondary | ICD-10-CM | POA: Diagnosis present

## 2019-12-10 DIAGNOSIS — N401 Enlarged prostate with lower urinary tract symptoms: Secondary | ICD-10-CM | POA: Diagnosis present

## 2019-12-10 DIAGNOSIS — K5900 Constipation, unspecified: Secondary | ICD-10-CM | POA: Diagnosis not present

## 2019-12-10 DIAGNOSIS — I340 Nonrheumatic mitral (valve) insufficiency: Secondary | ICD-10-CM

## 2019-12-10 DIAGNOSIS — E039 Hypothyroidism, unspecified: Secondary | ICD-10-CM | POA: Diagnosis present

## 2019-12-10 DIAGNOSIS — R079 Chest pain, unspecified: Secondary | ICD-10-CM

## 2019-12-10 DIAGNOSIS — I5032 Chronic diastolic (congestive) heart failure: Secondary | ICD-10-CM

## 2019-12-10 DIAGNOSIS — M1990 Unspecified osteoarthritis, unspecified site: Secondary | ICD-10-CM | POA: Diagnosis present

## 2019-12-10 DIAGNOSIS — I6523 Occlusion and stenosis of bilateral carotid arteries: Secondary | ICD-10-CM | POA: Diagnosis present

## 2019-12-10 DIAGNOSIS — I10 Essential (primary) hypertension: Secondary | ICD-10-CM | POA: Diagnosis not present

## 2019-12-10 DIAGNOSIS — I25710 Atherosclerosis of autologous vein coronary artery bypass graft(s) with unstable angina pectoris: Secondary | ICD-10-CM | POA: Diagnosis not present

## 2019-12-10 DIAGNOSIS — G8929 Other chronic pain: Secondary | ICD-10-CM | POA: Diagnosis present

## 2019-12-10 DIAGNOSIS — J9811 Atelectasis: Secondary | ICD-10-CM | POA: Diagnosis present

## 2019-12-10 DIAGNOSIS — I252 Old myocardial infarction: Secondary | ICD-10-CM | POA: Diagnosis not present

## 2019-12-10 DIAGNOSIS — Z20828 Contact with and (suspected) exposure to other viral communicable diseases: Secondary | ICD-10-CM | POA: Diagnosis present

## 2019-12-10 DIAGNOSIS — J449 Chronic obstructive pulmonary disease, unspecified: Secondary | ICD-10-CM | POA: Diagnosis present

## 2019-12-10 DIAGNOSIS — I25110 Atherosclerotic heart disease of native coronary artery with unstable angina pectoris: Principal | ICD-10-CM

## 2019-12-10 DIAGNOSIS — M549 Dorsalgia, unspecified: Secondary | ICD-10-CM | POA: Diagnosis present

## 2019-12-10 DIAGNOSIS — J441 Chronic obstructive pulmonary disease with (acute) exacerbation: Secondary | ICD-10-CM | POA: Diagnosis present

## 2019-12-10 DIAGNOSIS — F329 Major depressive disorder, single episode, unspecified: Secondary | ICD-10-CM | POA: Diagnosis present

## 2019-12-10 DIAGNOSIS — R0902 Hypoxemia: Secondary | ICD-10-CM | POA: Diagnosis present

## 2019-12-10 DIAGNOSIS — G309 Alzheimer's disease, unspecified: Secondary | ICD-10-CM | POA: Diagnosis present

## 2019-12-10 DIAGNOSIS — R338 Other retention of urine: Secondary | ICD-10-CM | POA: Diagnosis present

## 2019-12-10 DIAGNOSIS — I447 Left bundle-branch block, unspecified: Secondary | ICD-10-CM | POA: Diagnosis present

## 2019-12-10 DIAGNOSIS — F0280 Dementia in other diseases classified elsewhere without behavioral disturbance: Secondary | ICD-10-CM | POA: Diagnosis present

## 2019-12-10 DIAGNOSIS — F419 Anxiety disorder, unspecified: Secondary | ICD-10-CM | POA: Diagnosis present

## 2019-12-10 DIAGNOSIS — K5909 Other constipation: Secondary | ICD-10-CM | POA: Diagnosis present

## 2019-12-10 DIAGNOSIS — I25790 Atherosclerosis of other coronary artery bypass graft(s) with unstable angina pectoris: Secondary | ICD-10-CM | POA: Diagnosis present

## 2019-12-10 DIAGNOSIS — G4733 Obstructive sleep apnea (adult) (pediatric): Secondary | ICD-10-CM | POA: Diagnosis present

## 2019-12-10 HISTORY — PX: CORONARY STENT INTERVENTION: CATH118234

## 2019-12-10 HISTORY — PX: LEFT HEART CATH AND CORS/GRAFTS ANGIOGRAPHY: CATH118250

## 2019-12-10 HISTORY — PX: INTRAVASCULAR ULTRASOUND/IVUS: CATH118244

## 2019-12-10 LAB — D-DIMER, QUANTITATIVE: D-Dimer, Quant: 0.39 ug/mL-FEU (ref 0.00–0.50)

## 2019-12-10 LAB — COMPREHENSIVE METABOLIC PANEL
ALT: 23 U/L (ref 0–44)
AST: 26 U/L (ref 15–41)
Albumin: 3.8 g/dL (ref 3.5–5.0)
Alkaline Phosphatase: 49 U/L (ref 38–126)
Anion gap: 12 (ref 5–15)
BUN: 15 mg/dL (ref 8–23)
CO2: 28 mmol/L (ref 22–32)
Calcium: 9.1 mg/dL (ref 8.9–10.3)
Chloride: 101 mmol/L (ref 98–111)
Creatinine, Ser: 0.94 mg/dL (ref 0.61–1.24)
GFR calc Af Amer: >60 mL/min (ref >60)
GFR calc non Af Amer: >60 mL/min (ref >60)
Glucose, Bld: 112 mg/dL — ABNORMAL HIGH (ref 70–99)
Potassium: 3.4 mmol/L — ABNORMAL LOW (ref 3.5–5.1)
Sodium: 141 mmol/L (ref 135–145)
Total Bilirubin: 1.1 mg/dL (ref 0.3–1.2)
Total Protein: 6.5 g/dL (ref 6.5–8.1)

## 2019-12-10 LAB — CBC WITH DIFFERENTIAL/PLATELET
Abs Immature Granulocytes: 0.03 10*3/uL (ref 0.00–0.07)
Basophils Absolute: 0 10*3/uL (ref 0.0–0.1)
Basophils Relative: 0 %
Eosinophils Absolute: 0.2 10*3/uL (ref 0.0–0.5)
Eosinophils Relative: 2 %
HCT: 40.6 % (ref 39.0–52.0)
Hemoglobin: 13.2 g/dL (ref 13.0–17.0)
Immature Granulocytes: 0 %
Lymphocytes Relative: 25 %
Lymphs Abs: 2.4 10*3/uL (ref 0.7–4.0)
MCH: 30.8 pg (ref 26.0–34.0)
MCHC: 32.5 g/dL (ref 30.0–36.0)
MCV: 94.9 fL (ref 80.0–100.0)
Monocytes Absolute: 0.5 10*3/uL (ref 0.1–1.0)
Monocytes Relative: 5 %
Neutro Abs: 6.8 10*3/uL (ref 1.7–7.7)
Neutrophils Relative %: 68 %
Platelets: 252 10*3/uL (ref 150–400)
RBC: 4.28 MIL/uL (ref 4.22–5.81)
RDW: 13 % (ref 11.5–15.5)
WBC: 10 10*3/uL (ref 4.0–10.5)
nRBC: 0 % (ref 0.0–0.2)

## 2019-12-10 LAB — RESPIRATORY PANEL BY PCR

## 2019-12-10 LAB — ECHOCARDIOGRAM COMPLETE
Height: 69 in
Weight: 3360 oz

## 2019-12-10 LAB — TSH: TSH: 6.58 u[IU]/mL — ABNORMAL HIGH (ref 0.350–4.500)

## 2019-12-10 LAB — POCT ACTIVATED CLOTTING TIME: Activated Clotting Time: 290 seconds

## 2019-12-10 LAB — PHOSPHORUS: Phosphorus: 2.6 mg/dL (ref 2.5–4.6)

## 2019-12-10 LAB — TROPONIN I (HIGH SENSITIVITY)
Troponin I (High Sensitivity): 11 ng/L (ref <18)
Troponin I (High Sensitivity): 18 ng/L — ABNORMAL HIGH (ref <18)

## 2019-12-10 LAB — SARS CORONAVIRUS 2 (TAT 6-24 HRS): SARS Coronavirus 2: NEGATIVE

## 2019-12-10 LAB — MAGNESIUM: Magnesium: 1.9 mg/dL (ref 1.7–2.4)

## 2019-12-10 SURGERY — LEFT HEART CATH AND CORS/GRAFTS ANGIOGRAPHY
Anesthesia: LOCAL

## 2019-12-10 MED ORDER — IOHEXOL 350 MG/ML SOLN
INTRAVENOUS | Status: DC | PRN
  Administered 2019-12-10: 16:00:00 160 mL via INTRA_ARTERIAL

## 2019-12-10 MED ORDER — ESCITALOPRAM OXALATE 10 MG PO TABS
20.0000 mg | ORAL_TABLET | Freq: Every morning | ORAL | Status: DC
  Administered 2019-12-10 – 2019-12-11 (×2): 20 mg via ORAL
  Filled 2019-12-10 (×2): qty 2

## 2019-12-10 MED ORDER — MEMANTINE HCL 5 MG PO TABS
10.0000 mg | ORAL_TABLET | Freq: Two times a day (BID) | ORAL | Status: DC
  Administered 2019-12-10 – 2019-12-11 (×3): 10 mg via ORAL
  Filled 2019-12-10 (×2): qty 1
  Filled 2019-12-10 (×2): qty 2

## 2019-12-10 MED ORDER — SODIUM CHLORIDE 0.9 % IV SOLN: 250.0000 mL | INTRAVENOUS | Status: DC | PRN

## 2019-12-10 MED ORDER — METHYLPREDNISOLONE SODIUM SUCC 40 MG IJ SOLR
40.0000 mg | Freq: Every day | INTRAMUSCULAR | Status: AC
  Administered 2019-12-10: 11:00:00 40 mg via INTRAVENOUS
  Filled 2019-12-10: qty 1

## 2019-12-10 MED ORDER — LIDOCAINE HCL (PF) 1 % IJ SOLN
INTRAMUSCULAR | Status: DC | PRN
  Administered 2019-12-10: 2 mL

## 2019-12-10 MED ORDER — SODIUM CHLORIDE 0.9% FLUSH: 3.0000 mL | INTRAVENOUS | Status: DC | PRN

## 2019-12-10 MED ORDER — ACETAMINOPHEN 325 MG PO TABS: 650.0000 mg | ORAL_TABLET | Freq: Four times a day (QID) | ORAL | Status: DC | PRN

## 2019-12-10 MED ORDER — ASPIRIN 81 MG PO CHEW: 81.0000 mg | CHEWABLE_TABLET | ORAL | Status: DC

## 2019-12-10 MED ORDER — ONDANSETRON HCL 4 MG PO TABS: 4.0000 mg | ORAL_TABLET | Freq: Four times a day (QID) | ORAL | Status: DC | PRN

## 2019-12-10 MED ORDER — HEPARIN SODIUM (PORCINE) 1000 UNIT/ML IJ SOLN
INTRAMUSCULAR | Status: DC | PRN
  Administered 2019-12-10: 5000 [IU] via INTRAVENOUS
  Administered 2019-12-10: 6000 [IU] via INTRAVENOUS

## 2019-12-10 MED ORDER — IPRATROPIUM-ALBUTEROL 0.5-2.5 (3) MG/3ML IN SOLN
3.0000 mL | Freq: Three times a day (TID) | RESPIRATORY_TRACT | Status: DC
  Administered 2019-12-10 – 2019-12-11 (×3): 3 mL via RESPIRATORY_TRACT
  Filled 2019-12-10 (×3): qty 3

## 2019-12-10 MED ORDER — SODIUM CHLORIDE 0.9 % IV SOLN
INTRAVENOUS | Status: AC
  Administered 2019-12-10: 18:00:00 via INTRAVENOUS

## 2019-12-10 MED ORDER — SODIUM CHLORIDE 0.9% FLUSH
3.0000 mL | Freq: Two times a day (BID) | INTRAVENOUS | Status: DC
  Administered 2019-12-11: 3 mL via INTRAVENOUS

## 2019-12-10 MED ORDER — OXYCODONE HCL 5 MG PO TABS
30.0000 mg | ORAL_TABLET | ORAL | Status: DC | PRN
  Administered 2019-12-10 – 2019-12-11 (×2): 30 mg via ORAL
  Filled 2019-12-10 (×2): qty 6

## 2019-12-10 MED ORDER — AZITHROMYCIN 250 MG PO TABS
500.0000 mg | ORAL_TABLET | Freq: Every day | ORAL | Status: DC
  Administered 2019-12-10: 500 mg via ORAL
  Filled 2019-12-10 (×2): qty 2

## 2019-12-10 MED ORDER — SODIUM CHLORIDE 0.9% FLUSH
3.0000 mL | Freq: Two times a day (BID) | INTRAVENOUS | Status: DC
  Administered 2019-12-10: 3 mL via INTRAVENOUS

## 2019-12-10 MED ORDER — SODIUM CHLORIDE 0.9% FLUSH: 3.0000 mL | Freq: Two times a day (BID) | INTRAVENOUS | Status: DC

## 2019-12-10 MED ORDER — CLOPIDOGREL BISULFATE 75 MG PO TABS
75.0000 mg | ORAL_TABLET | Freq: Every day | ORAL | Status: DC
  Administered 2019-12-10 – 2019-12-11 (×2): 75 mg via ORAL
  Filled 2019-12-10 (×2): qty 1

## 2019-12-10 MED ORDER — PANTOPRAZOLE SODIUM 40 MG PO TBEC
40.0000 mg | DELAYED_RELEASE_TABLET | Freq: Every morning | ORAL | Status: DC
  Administered 2019-12-10 – 2019-12-11 (×2): 40 mg via ORAL
  Filled 2019-12-10 (×2): qty 1

## 2019-12-10 MED ORDER — FENTANYL CITRATE (PF) 100 MCG/2ML IJ SOLN
INTRAMUSCULAR | Status: AC
  Filled 2019-12-10: qty 2

## 2019-12-10 MED ORDER — ISOSORBIDE MONONITRATE ER 30 MG PO TB24
30.0000 mg | ORAL_TABLET | Freq: Every morning | ORAL | Status: DC
  Administered 2019-12-10: 30 mg via ORAL
  Filled 2019-12-10 (×2): qty 1

## 2019-12-10 MED ORDER — HEPARIN (PORCINE) IN NACL 1000-0.9 UT/500ML-% IV SOLN
INTRAVENOUS | Status: AC
  Filled 2019-12-10: qty 1000

## 2019-12-10 MED ORDER — LIDOCAINE HCL (PF) 1 % IJ SOLN
INTRAMUSCULAR | Status: AC
  Filled 2019-12-10: qty 30

## 2019-12-10 MED ORDER — QUETIAPINE FUMARATE 25 MG PO TABS
25.0000 mg | ORAL_TABLET | Freq: Every day | ORAL | Status: DC
  Administered 2019-12-10 (×2): 25 mg via ORAL
  Filled 2019-12-10 (×3): qty 1

## 2019-12-10 MED ORDER — IPRATROPIUM-ALBUTEROL 0.5-2.5 (3) MG/3ML IN SOLN
3.0000 mL | Freq: Four times a day (QID) | RESPIRATORY_TRACT | Status: DC
  Administered 2019-12-10 (×2): 3 mL via RESPIRATORY_TRACT
  Filled 2019-12-10 (×2): qty 3

## 2019-12-10 MED ORDER — BISACODYL 5 MG PO TBEC: 5.0000 mg | DELAYED_RELEASE_TABLET | ORAL | Status: DC

## 2019-12-10 MED ORDER — HEPARIN SODIUM (PORCINE) 1000 UNIT/ML IJ SOLN
INTRAMUSCULAR | Status: AC
  Filled 2019-12-10: qty 1

## 2019-12-10 MED ORDER — HEPARIN (PORCINE) IN NACL 1000-0.9 UT/500ML-% IV SOLN
INTRAVENOUS | Status: DC | PRN
  Administered 2019-12-10 (×2): 500 mL

## 2019-12-10 MED ORDER — SODIUM CHLORIDE 0.9 % WEIGHT BASED INFUSION: 1.0000 mL/kg/h | INTRAVENOUS | Status: DC

## 2019-12-10 MED ORDER — HYDRALAZINE HCL 20 MG/ML IJ SOLN: 10.0000 mg | INTRAMUSCULAR | Status: AC | PRN

## 2019-12-10 MED ORDER — ALBUTEROL SULFATE (2.5 MG/3ML) 0.083% IN NEBU: 2.5000 mg | INHALATION_SOLUTION | RESPIRATORY_TRACT | Status: DC | PRN

## 2019-12-10 MED ORDER — DUTASTERIDE 0.5 MG PO CAPS
0.5000 mg | ORAL_CAPSULE | Freq: Every day | ORAL | Status: DC
  Filled 2019-12-10 (×2): qty 1

## 2019-12-10 MED ORDER — PILOCARPINE HCL 5 MG PO TABS
5.0000 mg | ORAL_TABLET | Freq: Two times a day (BID) | ORAL | Status: DC
  Administered 2019-12-10 – 2019-12-11 (×3): 5 mg via ORAL
  Filled 2019-12-10 (×5): qty 1

## 2019-12-10 MED ORDER — PREDNISONE 20 MG PO TABS: 40.0000 mg | ORAL_TABLET | Freq: Every day | ORAL | Status: DC

## 2019-12-10 MED ORDER — BUSPIRONE HCL 5 MG PO TABS
10.0000 mg | ORAL_TABLET | Freq: Two times a day (BID) | ORAL | Status: DC
  Administered 2019-12-10 – 2019-12-11 (×3): 10 mg via ORAL
  Filled 2019-12-10: qty 1
  Filled 2019-12-10 (×2): qty 2
  Filled 2019-12-10: qty 1

## 2019-12-10 MED ORDER — FENTANYL CITRATE (PF) 100 MCG/2ML IJ SOLN
INTRAMUSCULAR | Status: DC | PRN
  Administered 2019-12-10: 50 ug via INTRAVENOUS
  Administered 2019-12-10: 25 ug via INTRAVENOUS

## 2019-12-10 MED ORDER — GABAPENTIN 300 MG PO CAPS
300.0000 mg | ORAL_CAPSULE | Freq: Two times a day (BID) | ORAL | Status: DC
  Administered 2019-12-10 – 2019-12-11 (×3): 300 mg via ORAL
  Filled 2019-12-10 (×3): qty 1

## 2019-12-10 MED ORDER — MOMETASONE FURO-FORMOTEROL FUM 200-5 MCG/ACT IN AERO
1.0000 | INHALATION_SPRAY | Freq: Two times a day (BID) | RESPIRATORY_TRACT | Status: DC
  Administered 2019-12-11: 08:00:00 1 via RESPIRATORY_TRACT
  Filled 2019-12-10 (×2): qty 8.8

## 2019-12-10 MED ORDER — VERAPAMIL HCL 2.5 MG/ML IV SOLN
INTRAVENOUS | Status: AC
  Filled 2019-12-10: qty 2

## 2019-12-10 MED ORDER — VERAPAMIL HCL 2.5 MG/ML IV SOLN
INTRAVENOUS | Status: DC | PRN
  Administered 2019-12-10: 15:00:00 10 mL via INTRA_ARTERIAL

## 2019-12-10 MED ORDER — LEVOTHYROXINE SODIUM 50 MCG PO TABS
50.0000 ug | ORAL_TABLET | Freq: Every morning | ORAL | Status: DC
  Administered 2019-12-10: 50 ug via ORAL
  Filled 2019-12-10: qty 1

## 2019-12-10 MED ORDER — LABETALOL HCL 5 MG/ML IV SOLN: 10.0000 mg | INTRAVENOUS | Status: AC | PRN

## 2019-12-10 MED ORDER — POTASSIUM CHLORIDE CRYS ER 20 MEQ PO TBCR: 40.0000 meq | EXTENDED_RELEASE_TABLET | Freq: Once | ORAL | Status: DC

## 2019-12-10 MED ORDER — CLOPIDOGREL BISULFATE 300 MG PO TABS
ORAL_TABLET | ORAL | Status: DC | PRN
  Administered 2019-12-10: 300 mg via ORAL

## 2019-12-10 MED ORDER — SODIUM CHLORIDE 0.9 % WEIGHT BASED INFUSION: 3.0000 mL/kg/h | INTRAVENOUS | Status: DC

## 2019-12-10 MED ORDER — PRAVASTATIN SODIUM 40 MG PO TABS
40.0000 mg | ORAL_TABLET | Freq: Every day | ORAL | Status: DC
  Administered 2019-12-10: 40 mg via ORAL
  Filled 2019-12-10: qty 1

## 2019-12-10 MED ORDER — ACETAMINOPHEN 650 MG RE SUPP: 650.0000 mg | Freq: Four times a day (QID) | RECTAL | Status: DC | PRN

## 2019-12-10 MED ORDER — CLOPIDOGREL BISULFATE 300 MG PO TABS
ORAL_TABLET | ORAL | Status: AC
  Filled 2019-12-10: qty 1

## 2019-12-10 MED ORDER — MIDAZOLAM HCL 2 MG/2ML IJ SOLN
INTRAMUSCULAR | Status: AC
  Filled 2019-12-10: qty 2

## 2019-12-10 MED ORDER — TAMSULOSIN HCL 0.4 MG PO CAPS
0.4000 mg | ORAL_CAPSULE | Freq: Every day | ORAL | Status: DC
  Administered 2019-12-10 – 2019-12-11 (×2): 0.4 mg via ORAL
  Filled 2019-12-10 (×2): qty 1

## 2019-12-10 MED ORDER — ALBUTEROL SULFATE HFA 108 (90 BASE) MCG/ACT IN AERS: 2.0000 | INHALATION_SPRAY | Freq: Four times a day (QID) | RESPIRATORY_TRACT | Status: DC | PRN

## 2019-12-10 MED ORDER — MIDAZOLAM HCL 2 MG/2ML IJ SOLN
INTRAMUSCULAR | Status: DC | PRN
  Administered 2019-12-10 (×2): 1 mg via INTRAVENOUS

## 2019-12-10 MED ORDER — ONDANSETRON HCL 4 MG/2ML IJ SOLN: 4.0000 mg | Freq: Four times a day (QID) | INTRAMUSCULAR | Status: DC | PRN

## 2019-12-10 MED ORDER — ENOXAPARIN SODIUM 40 MG/0.4ML ~~LOC~~ SOLN: 40.0000 mg | SUBCUTANEOUS | Status: DC

## 2019-12-10 MED ORDER — LEVOTHYROXINE SODIUM 50 MCG PO TABS
50.0000 ug | ORAL_TABLET | Freq: Every day | ORAL | Status: DC
  Administered 2019-12-11: 50 ug via ORAL
  Filled 2019-12-10: qty 1

## 2019-12-10 MED ORDER — HYDROCODONE-ACETAMINOPHEN 5-325 MG PO TABS
1.0000 | ORAL_TABLET | ORAL | Status: DC | PRN
  Administered 2019-12-10: 2 via ORAL
  Filled 2019-12-10: qty 2

## 2019-12-10 MED ORDER — DOXAZOSIN MESYLATE 1 MG PO TABS
1.0000 mg | ORAL_TABLET | Freq: Every day | ORAL | Status: DC
  Administered 2019-12-11: 1 mg via ORAL
  Filled 2019-12-10 (×3): qty 1

## 2019-12-10 SURGICAL SUPPLY — 21 items
BALLN  ~~LOC~~ SAPPHIRE 4.5X12 (BALLOONS) ×1
BALLN EMERGE MR 2.5X12 (BALLOONS) ×2
BALLN ~~LOC~~ SAPPHIRE 4.5X12 (BALLOONS) ×1
BALLOON EMERGE MR 2.5X12 (BALLOONS) IMPLANT
BALLOON ~~LOC~~ SAPPHIRE 4.5X12 (BALLOONS) IMPLANT
CATH DXT MULTI JL4 JR4 ANG PIG (CATHETERS) ×1 IMPLANT
CATH INFINITI 5 FR LCB (CATHETERS) ×1 IMPLANT
CATH OPTICROSS 40MHZ (CATHETERS) ×1 IMPLANT
CATH VISTA GUIDE 6FR XBLAD3.5 (CATHETERS) ×1 IMPLANT
DEVICE RAD COMP TR BAND LRG (VASCULAR PRODUCTS) ×1 IMPLANT
ELECT DEFIB PAD ADLT CADENCE (PAD) ×1 IMPLANT
GLIDESHEATH SLEND SS 6F .021 (SHEATH) ×1 IMPLANT
GUIDEWIRE INQWIRE 1.5J.035X260 (WIRE) IMPLANT
INQWIRE 1.5J .035X260CM (WIRE) ×2
KIT HEART LEFT (KITS) ×2 IMPLANT
PACK CARDIAC CATHETERIZATION (CUSTOM PROCEDURE TRAY) ×2 IMPLANT
SLED PULL BACK IVUS (MISCELLANEOUS) ×1 IMPLANT
STENT RESOLUTE ONYX 4.5X18 (Permanent Stent) ×1 IMPLANT
TRANSDUCER W/STOPCOCK (MISCELLANEOUS) ×2 IMPLANT
TUBING CIL FLEX 10 FLL-RA (TUBING) ×2 IMPLANT
WIRE COUGAR XT STRL 190CM (WIRE) ×1 IMPLANT

## 2019-12-10 NOTE — H&P (View-Only) (Signed)
The patient has been seen in conjunction with Reino Bellis, NP. All aspects of care have been considered and discussed. The patient has been personally interviewed, examined, and all clinical data has been reviewed.   Atypical chest pain of sharp nature, may not represent ischemic pain.  Progressive dyspnea on exertion over the past 12 months may be an anginal equivalent.  Given the impact that the symptoms have had on the patient, coronary angiography is recommended to define anatomy and help guide therapy.  The patient was counseled to undergo left heart catheterization, coronary angiography, and possible percutaneous coronary intervention with stent implantation. The procedural risks and benefits were discussed in detail. The risks discussed included death, stroke, myocardial infarction, life-threatening bleeding, limb ischemia, kidney injury, allergy, and possible emergency cardiac surgery. The risk of these significant complications were estimated to occur less than 1% of the time. After discussion, the patient has agreed to proceed.     Cardiology Consultation:   Patient ID: Dustin [name] MRN: [id number]; DOB: 06/27/1934  Admit date: 12/09/2019 Date of Consult: 12/10/2019  Primary Care Provider: [name], MD Primary Cardiologist: Rozann Lesches, MD  Primary Electrophysiologist:  None    Patient Profile:   Dustin [name] is a 85 y.o. male with a history of CAD s/p remote in 1996 with subsequent DES x2 to SVG to PDA in 07/2011 with distal disease managed medially, LBBB, mild bilateral carotid artery stenosis, hypertension, hyperlipidemia, obstructive sleep apnea, hypothyroidism, and Alzheimer disease who is being seen today for the evaluation of chest pain at the request of Dr. Roel Cluck.   History of Present Illness:   Dustin [name] is a 85 year old male with the above history who is followed by Dr. Domenic Polite. Most recent ischemic evaluation was a Myoview in 12/2016 which  showed evidence of scar/soft tissue shadowing and minimal ischemia. Optimization of medical therapy was recommended at that time. Most recent Echo in 10/2018 showed LVEF of 55-60% with normal wall motion and grade 2 diastolic dysfunction with high ventricular filling pressure. Patient was last seen by Dr. Domenic Polite in 07/2019 at which time he denied any obvious angina symptoms but was not being very active. Plan was to continue to follow patient conservatively on medical therapy in the absence of progressive angina symptoms.   Patient presented to the [facility] ED on 12/07/2019 with altered mental status and difficulty breathing. COVID-19 negative. Patient was started on IV steroids and IV fluids and then admitted for COPD exacerbation and metabolic encephalopathy. Patient was discharged the next day.   He returned to the [facility] ED on 12/09/2019 for evaluation of chest pain.  States after being discharged from the hospital on Saturday he felt well.  States Sunday morning he got up and ate breakfast.  Shortly thereafter developed centralized crushing chest pressure which radiated to his shoulders and neck.  Stated these episodes waxed and waned throughout the day.  He states that the pain was so intense it did make him cry.  Told his wife that he was not sure if he was going to make it.  Later that evening did call his daughter-in-law who brought him to the ED for further evaluation.  In talking with the patient he does report frequent sublingual nitroglycerin use over the past several weeks.  States he might use 1 to 2 tablets versus 5 to 6 tablets in a day's time.  Episodes seem to be more frequent.  Of note he did not have any episodes of  chest pain while admitted to [facility].  In the ED, EKG showed sinus arrhythmia with no acute ST/T changes. High-sensitivity troponin 17 >> 14 >> 11 >> 18. D-dimer negative. Chest x-ray showed mild bibasilar atelectasis. WBC 13.7, Hgb 12.0, Plts 245. Na 140, K  3.7, Glucose 127, Cr 0.98. COVID-19 negative.   Heart Pathway Score:     Past Medical History:  Diagnosis Date  . Alzheimer disease ([place])   . Anxiety   . Arthritis   . Atrophic gastritis    a. By EGD 02/2013.  . Carotid artery disease ([place])    a. mild-mod plaque, <50% stenosis bilat by duplex 2018.  [name] Coronary atherosclerosis of native coronary artery    a. Multivessel s/p CABG 1996. b. s/p DES x 2 SVG to PDA 8/12 with distal disease managed medically.  . DDD (degenerative disc disease)    Chronic back pain  . Enlarged prostate   . Essential hypertension, benign   . Hematuria   . Hypothyroidism   . LBBB (left bundle branch block)   . MI (myocardial infarction) ([place])   . Mixed hyperlipidemia   . OA (osteoarthritis)   . OSA (obstructive sleep apnea)   . Sinus bradycardia    a. Aricept and BB discontinued due to this.    Past Surgical History:  Procedure Laterality Date  . COLONOSCOPY  08/03/2004   Jenkins-numerous large diverticula in the descending, transverse, descending, and sigmoid colon. Otherwise normal exam.  . COLONOSCOPY  07/12/2012   RMR: External hemorrhoidal tag; multiple rectal and colonic polyps removed and/or treated as described above. Pancolonic diverticulosis. Bx-tubular adenomas, rectal hyperplastic polyp. next colonoscopy in 06/2015.  [name] COLONOSCOPY N/A 06/22/2016   Procedure: COLONOSCOPY;  Surgeon: Aviva Signs, MD;  Location: [facility] END[facility];  Service: Gastroenterology;  Laterality: N/A;  730  . CORONARY ANGIOPLASTY WITH STENT PLACEMENT    . CORONARY ARTERY BYPASS GRAFT  1996   LIMA to LAD, SVG to D2, SVG to PDA, SVG to OM1 and OM2  . ESOPHAGOGASTRODUODENOSCOPY N/A 03/16/2013   Procedure: ESOPHAGOGASTRODUODENOSCOPY (EGD);  Surgeon: Daneil Dolin, MD;  Location: [facility];  Service: Endoscopy;  Laterality: N/A;  12:00-moved to [place] notified pt  . ESOPHAGOGASTRODUODENOSCOPY N/A 03/06/2013   Procedure: ESOPHAGOGASTRODUODENOSCOPY (EGD);  Surgeon: Daneil Dolin, MD;  Location: [facility];  Service: Endoscopy;  Laterality: N/A;  . HERNIA REPAIR       Home Medications:  Prior to Admission medications   Medication Sig Start Date End Date Taking? Authorizing Provider  albuterol (PROVENTIL) (2.5 MG/3ML) 0.083% nebulizer solution Take 3 mLs (2.5 mg total) by nebulization every 6 (six) hours as needed for wheezing or shortness of breath. Patient will also need the nebulizer machine with this prescription. 10/22/18  Yes Nat Christen, MD  albuterol (VENTOLIN HFA) 108 (90 Base) MCG/ACT inhaler Inhale 2 puffs into the lungs every 6 (six) hours as needed for wheezing or shortness of breath.   Yes [provider]  bisacodyl (DULCOLAX) 5 MG EC tablet Take 5 mg by mouth 2 (two) times a week.   Yes [provider]  busPIRone (BUSPAR) 10 MG tablet Take 10 mg by mouth 2 (two) times daily.  11/03/18  Yes [provider]  clopidogrel (PLAVIX) 75 MG tablet Take 1 tablet (75 mg total) by mouth daily. 08/14/19  Yes Satira Sark, MD  azithromycin (ZITHROMAX) 500 MG tablet Take 1 tablet (500 mg total) by mouth daily for 4 days. This is  to treat for your COPD flare up. 12/08/19 12/12/19  Enzo Bi, MD  DOK 100 MG capsule Take 100 mg by mouth daily. 11/19/19   [provider]  doxazosin (CARDURA) 1 MG tablet Take 1 mg by mouth daily. 11/19/19   [provider]  dutasteride (AVODART) 0.5 MG capsule Take 0.5 mg by mouth. In the afternoon    [provider]  escitalopram (LEXAPRO) 20 MG tablet Take 20 mg by mouth every morning.     [provider]  furosemide (LASIX) 20 MG tablet Take 20 mg by mouth 2 (two) times daily.  12/03/16   [provider]  gabapentin (NEURONTIN) 300 MG capsule Take 300 mg by mouth 2 (two) times daily.    [provider]  hydrOXYzine (ATARAX/VISTARIL) 25 MG tablet Take 25 mg by mouth 4 (four) times daily as needed for anxiety.  11/19/19   [provider]    isosorbide mononitrate (IMDUR) 30 MG 24 hr tablet Take 1 tablet (30 mg total) by mouth every morning. 12/08/19   Enzo Bi, MD  levothyroxine (SYNTHROID, LEVOTHROID) 50 MCG tablet Take 50 mcg by mouth every morning.  10/04/16   [provider]  memantine (NAMENDA) 10 MG tablet Take 10 mg by mouth 2 (two) times daily.  03/05/16   [provider]  nitroGLYCERIN (NITROSTAT) 0.4 MG SL tablet Place 1 tablet (0.4 mg total) under the tongue every 5 (five) minutes as needed for chest pain. PLACE ONE TABLET UNDER TONGUE EVERY 5 MIN UP TO 3 DOSES AS NEEDED FORCHEST PAIN. 12/08/19   Enzo Bi, MD  oxycodone (ROXICODONE) 30 MG immediate release tablet Take 30 mg by mouth every 4 (four) hours as needed for pain. Take 1 tablet by mouth every 4 to 6 hours as needed for pain.  Max 8 /24 hours. 12/09/16   [provider]  pantoprazole (PROTONIX) 40 MG tablet Take 1 tablet (40 mg total) by mouth every morning. 12/08/19   Enzo Bi, MD  pilocarpine (SALAGEN) 5 MG tablet Take 5 mg by mouth 2 (two) times daily.    [provider]  potassium chloride SA (K-DUR,KLOR-CON) 20 MEQ tablet Take 20 mEq by mouth daily.     [provider]  pravastatin (PRAVACHOL) 40 MG tablet Take 1 tablet (40 mg total) by mouth at bedtime. 12/08/19   Enzo Bi, MD  predniSONE (DELTASONE) 20 MG tablet Take 2 tablets (40 mg total) by mouth daily with breakfast for 3 days. This is for your COPD flare up 12/09/19 12/12/19  Enzo Bi, MD  QUEtiapine (SEROQUEL) 25 MG tablet Take 25 mg by mouth at bedtime. 11/19/19   [provider]  tamsulosin (FLOMAX) 0.4 MG CAPS capsule Take 1 capsule (0.4 mg total) by mouth daily. To help you pee. 12/08/19   Enzo Bi, MD    Inpatient Medications: Scheduled Meds: . azithromycin  500 mg Oral Daily  . bisacodyl  5 mg Oral Once per day on Mon Thu  . busPIRone  10 mg Oral BID  . clopidogrel  75 mg Oral Daily  . doxazosin  1 mg Oral Daily  . dutasteride  0.5 mg  Oral Q1400  . enoxaparin (LOVENOX) injection  40 mg Subcutaneous Q24H  . escitalopram  20 mg Oral q morning - 10a  . gabapentin  300 mg Oral BID  . ipratropium-albuterol  3 mL Nebulization TID  . isosorbide mononitrate  30 mg Oral q morning - 10a  . levothyroxine  50 mcg Oral  q morning - 10a  . memantine  10 mg Oral BID  . mometasone-formoterol  1 puff Inhalation BID  . pantoprazole  40 mg Oral q morning - 10a  . pilocarpine  5 mg Oral BID  . pravastatin  40 mg Oral QHS  . [START ON 12/11/2019] predniSONE  40 mg Oral Q breakfast  . QUEtiapine  25 mg Oral QHS  . sodium chloride flush  3 mL Intravenous Q12H  . sodium chloride flush  3 mL Intravenous Q12H  . tamsulosin  0.4 mg Oral Daily   Continuous Infusions: . sodium chloride     PRN Meds: sodium chloride, acetaminophen **OR** acetaminophen, albuterol, albuterol, HYDROcodone-acetaminophen, ondansetron **OR** ondansetron (ZOFRAN) IV, oxycodone, sodium chloride flush  Allergies:   No Known Allergies  Social History:   Social History   Socioeconomic History  . Marital status: Married    Spouse name: Not on file  . Number of children: 3  . Years of education: Not on file  . Highest education level: Not on file  Occupational History  . Occupation: retired    Fish farm manager: RETIRED  Tobacco Use  . Smoking status: Former Smoker    Packs/day: 2.00    Years: 40.00    Pack years: 80.00    Types: Cigarettes    Quit date: 12/27/1994    Years since quitting: 24.9  . Smokeless tobacco: Never Used  Substance and Sexual Activity  . Alcohol use: No    Alcohol/week: 0.0 standard drinks  . Drug use: No  . Sexual activity: Not Currently  Other Topics Concern  . Not on file  Social History Narrative   Lives w/ ailing wife.   Son brings meals 3x per week   Social Determinants of Health   Financial Resource Strain:   . Difficulty of Paying Living Expenses: Not on file  Food Insecurity:   . Worried About Charity fundraiser in the  Last Year: Not on file  . Ran Out of Food in the Last Year: Not on file  Transportation Needs:   . Lack of Transportation (Medical): Not on file  . Lack of Transportation (Non-Medical): Not on file  Physical Activity:   . Days of Exercise per Week: Not on file  . Minutes of Exercise per Session: Not on file  Stress:   . Feeling of Stress : Not on file  Social Connections:   . Frequency of Communication with Friends and Family: Not on file  . Frequency of Social Gatherings with Friends and Family: Not on file  . Attends Religious Services: Not on file  . Active Member of Clubs or Organizations: Not on file  . Attends Archivist Meetings: Not on file  . Marital Status: Not on file  Intimate Partner Violence:   . Fear of Current or Ex-Partner: Not on file  . Emotionally Abused: Not on file  . Physically Abused: Not on file  . Sexually Abused: Not on file    Family History:    Family History  Problem Relation Age of Onset  . Heart disease Other   . Heart attack Mother   . Colon cancer Neg Hx      ROS:  Please see the history of present illness.   All other ROS reviewed and negative.     Physical Exam/Data:   Vitals:   12/10/19 0600 12/10/19 0630 12/10/19 0700 12/10/19 0726  BP: (!) 149/66 (!) 144/63 120/69   Pulse: (!) 50 (!) 50 (!) 50 (!)  54  Resp: 12 13 12 14   Temp:      TempSrc:      SpO2: 90% (!) 89% 91% 92%  Weight:      Height:        Intake/Output Summary (Last 24 hours) at 12/10/2019 1206 Last data filed at 12/10/2019 0047 Gross per 24 hour  Intake --  Output 700 ml  Net -700 ml   Last 3 Weights 12/09/2019 12/07/2019 12/07/2019  Weight (lbs) 210 lb 212 lb 4.9 oz 210 lb  Weight (kg) 95.255 kg 96.3 kg 95.255 kg     Body mass index is 31.01 kg/m.  General:  Well nourished, well developed, in no acute distress HEENT: normal Lymph: no adenopathy Neck: no JVD Endocrine:  No thryomegaly Vascular: No carotid bruits; FA pulses 2+ bilaterally  without bruits  Cardiac:  normal S1, S2; RRR; no murmur  Lungs:  clear to auscultation bilaterally, no wheezing, rhonchi or rales  Abd: soft, nontender, no hepatomegaly  Ext: no edema Musculoskeletal:  No deformities, BUE and BLE strength normal and equal Skin: warm and dry  Neuro:  CNs 2-12 intact, no focal abnormalities noted Psych:  Normal affect   EKG:  The EKG was personally reviewed and demonstrates:  Sinus arrhythmia with known LBBB  Relevant CV Studies:  Myoview 12/28/2016:  Inferior defect that shows partial improvement consistent with scar/soft tissue attenuation and periinfarct ischemia  LVEF 46%  Intermediate risk study _______________  Echocardiogram 12/10/2019:  IMPRESSIONS    1. Left ventricular ejection fraction, by visual estimation, is 55 to 60%. The left ventricle has normal function. There is no left ventricular hypertrophy.  2. Left ventricular diastolic parameters are consistent with Grade I diastolic dysfunction (impaired relaxation).  3. Severely dilated left ventricular internal cavity size.  4. The left ventricle has no regional wall motion abnormalities.  5. No change in LVEF from echo done in 2019.  6. Global right ventricle has normal systolic function.The right ventricular size is normal. No increase in right ventricular wall thickness.  7. Left atrial size was mildly dilated.  8. Right atrial size was mildly dilated.  9. The mitral valve is normal in structure. Mild mitral valve regurgitation. 10. The tricuspid valve is grossly normal. Tricuspid valve regurgitation is trivial. 11. The aortic valve is tricuspid. Aortic valve regurgitation is not visualized. Mild aortic valve sclerosis without stenosis. 12. The pulmonic valve was grossly normal. Pulmonic valve regurgitation is trivial. 13. Normal pulmonary artery systolic pressure. 14. The interatrial septum was not assessed.   Laboratory Data:  High Sensitivity Troponin:   Recent Labs  Lab  12/07/19 1814 12/09/19 1917 12/09/19 2212 12/10/19 0004 12/10/19 0357  TROPONINIHS 8 17 14 11  18*     Chemistry Recent Labs  Lab 12/08/19 0637 12/09/19 1917 12/10/19 0357  NA 139 140 141  K 3.8 3.7 3.4*  CL 102 102 101  CO2 26 30 28   GLUCOSE 170* 127* 112*  BUN 22 18 15   CREATININE 0.92 0.98 0.94  CALCIUM 8.7* 9.1 9.1  GFRNONAA >60 >60 >60  GFRAA >60 >60 >60  ANIONGAP 11 8 12     Recent Labs  Lab 12/07/19 1557 12/10/19 0357  PROT 7.1 6.5  ALBUMIN 4.1 3.8  AST 28 26  ALT 21 23  ALKPHOS 57 49  BILITOT 1.0 1.1   Hematology Recent Labs  Lab 12/07/19 1557 12/09/19 1917 12/10/19 0357  WBC 10.0 13.7* 10.0  RBC 4.05* 3.96* 4.28  HGB 12.5* 12.0* 13.2  HCT 39.2  37.9* 40.6  MCV 96.8 95.7 94.9  MCH 30.9 30.3 30.8  MCHC 31.9 31.7 32.5  RDW 13.0 12.9 13.0  PLT 214 245 252   BNP Recent Labs  Lab 12/07/19 1557  BNP 41.0    DDimer  Recent Labs  Lab 12/10/19 0004  DDIMER 0.39     Radiology/Studies:  DG Chest 2 View  Result Date: 12/09/2019 CLINICAL DATA:  Chest pain for 2-3 months EXAM: CHEST - 2 VIEW COMPARISON:  12/07/2019 FINDINGS: Cardiac shadow is again enlarged but stable. Postsurgical changes are noted. Aortic calcifications are seen. The lungs are mildly hyperinflated with bibasilar atelectatic changes. No sizable effusion is noted. No focal infiltrate is seen. IMPRESSION: Mild bibasilar atelectasis new from the prior exam. Electronically Signed   By: Inez Catalina M.D.   On: 12/09/2019 20:16   DG Abd 1 View  Result Date: 12/10/2019 CLINICAL DATA:  Constipation. EXAM: ABDOMEN - 1 VIEW COMPARISON:  January 22, 2013 FINDINGS: The bowel gas pattern is normal. No radio-opaque calculi or other significant radiographic abnormality are seen. IMPRESSION: Negative. Electronically Signed   By: Constance Holster M.D.   On: 12/10/2019 00:53   DG Chest Port 1 View  Result Date: 12/07/2019 CLINICAL DATA:  Per triage note: Family member states pt has had altered  loc for the last couple days and has been unable to void; pt has audible wheezing EXAM: PORTABLE CHEST 1 VIEW COMPARISON:  10/01/2019 and earlier studies. FINDINGS: Stable changes from prior CABG surgery. Cardiac silhouette is top-normal in size. No mediastinal or hilar masses. Lungs are clear.  No pleural effusion or pneumothorax. Skeletal structures are grossly intact. IMPRESSION: No acute cardiopulmonary disease. Electronically Signed   By: Lajean Manes M.D.   On: 12/07/2019 16:47   ECHOCARDIOGRAM COMPLETE  Result Date: 12/10/2019   ECHOCARDIOGRAM REPORT   Patient Name:   Dustin [name] Date of Exam: 12/10/2019 Medical Rec #:  [id number]       Height:       69.0 in Accession #:    [id number]      Weight:       210.0 lb Date of Birth:  1934-03-10       BSA:          2.11 m Patient Age:    85 years        BP:           120/69 mmHg Patient Gender: M               HR:           54 bpm. Exam Location:  Inpatient Procedure: 2D Echo Indications:    Chest Pain R07.9  History:        Patient has prior history of Echocardiogram examinations, most                 recent 11/08/2018. CAD and Previous Myocardial Infarction, Prior                 CABG, Arrythmias:LBBB; Risk Factors:Hypertension and                 Dyslipidemia.  Sonographer:    Mikki Santee RDCS (AE) Referring Phys: [facility]  1. Left ventricular ejection fraction, by visual estimation, is 55 to 60%. The left ventricle has normal function. There is no left ventricular hypertrophy.  2. Left ventricular diastolic parameters are consistent with Grade I diastolic dysfunction (impaired relaxation).  3. Severely dilated left ventricular  internal cavity size.  4. The left ventricle has no regional wall motion abnormalities.  5. No change in LVEF from echo done in 2019.  6. Global right ventricle has normal systolic function.The right ventricular size is normal. No increase in right ventricular wall thickness.  7. Left atrial size  was mildly dilated.  8. Right atrial size was mildly dilated.  9. The mitral valve is normal in structure. Mild mitral valve regurgitation. 10. The tricuspid valve is grossly normal. Tricuspid valve regurgitation is trivial. 11. The aortic valve is tricuspid. Aortic valve regurgitation is not visualized. Mild aortic valve sclerosis without stenosis. 12. The pulmonic valve was grossly normal. Pulmonic valve regurgitation is trivial. 13. Normal pulmonary artery systolic pressure. 14. The interatrial septum was not assessed. FINDINGS  Left Ventricle: Left ventricular ejection fraction, by visual estimation, is 55 to 60%. The left ventricle has normal function. The left ventricle has no regional wall motion abnormalities. The left ventricular internal cavity size was severely dilated left ventricle. There is no left ventricular hypertrophy. Left ventricular diastolic parameters are consistent with Grade I diastolic dysfunction (impaired relaxation). No change in LVEF from echo done in 2019. Right Ventricle: The right ventricular size is normal. No increase in right ventricular wall thickness. Global RV systolic function is has normal systolic function. The tricuspid regurgitant velocity is 2.23 m/s, and with an assumed right atrial pressure  of 3 mmHg, the estimated right ventricular systolic pressure is normal at 22.9 mmHg. Left Atrium: Left atrial size was mildly dilated. Right Atrium: Right atrial size was mildly dilated Pericardium: There is no evidence of pericardial effusion. Mitral Valve: The mitral valve is normal in structure. Mild mitral valve regurgitation. Tricuspid Valve: The tricuspid valve is grossly normal. Tricuspid valve regurgitation is trivial. Aortic Valve: The aortic valve is tricuspid. Aortic valve regurgitation is not visualized. Mild aortic valve sclerosis is present, with no evidence of aortic valve stenosis. Pulmonic Valve: The pulmonic valve was grossly normal. Pulmonic valve regurgitation is  trivial. Pulmonic regurgitation is trivial. Aorta: The aortic root is normal in size and structure. Venous: The inferior vena cava was not well visualized. IAS/Shunts: The interatrial septum was not assessed.  LEFT VENTRICLE PLAX 2D LVIDd:         6.11 cm  Diastology LVIDs:         4.00 cm  LV e' lateral:   9.79 cm/s LV PW:         1.03 cm  LV E/e' lateral: 9.8 LV IVS:        1.04 cm  LV e' medial:    6.96 cm/s LVOT diam:     2.40 cm  LV E/e' medial:  13.8 LV SV:         118 ml LV SV Index:   53.93 LVOT Area:     4.52 cm  RIGHT VENTRICLE RV S prime:     9.36 cm/s TAPSE (M-mode): 1.4 cm LEFT ATRIUM             Index       RIGHT ATRIUM           Index LA diam:        4.60 cm 2.18 cm/m  RA Area:     23.90 cm LA Vol (A2C):   72.1 ml 34.18 ml/m RA Volume:   69.40 ml  32.90 ml/m LA Vol (A4C):   52.3 ml 24.80 ml/m LA Biplane Vol: 61.5 ml 29.16 ml/m  AORTIC VALVE LVOT Vmax:  88.60 cm/s LVOT Vmean:  53.800 cm/s LVOT VTI:    0.198 m  AORTA Ao Root diam: 3.30 cm MITRAL VALVE                         TRICUSPID VALVE MV Area (PHT): 3.53 cm              TR Peak grad:   19.9 mmHg MV PHT:        62.35 msec            TR Vmax:        223.00 cm/s MV Decel Time: 215 msec MR Peak grad: 92.2 mmHg              SHUNTS MR Vmax:      480.00 cm/s            Systemic VTI:  0.20 m MV E velocity: 96.10 cm/s  103 cm/s  Systemic Diam: 2.40 cm MV A velocity: 115.00 cm/s 70.3 cm/s MV E/A ratio:  0.84        1.5  Dorris Carnes MD Electronically signed by Dorris Carnes MD Signature Date/Time: 12/10/2019/11:36:54 AM    Final     Assessment and Plan:   Dustin [name] is a 85 y.o. male with a history of CAD s/p remote in 1996 with subsequent DES x2 to SVG to PDA in 07/2011 with distal disease managed medially, LBBB, mild bilateral carotid artery stenosis, hypertension, hyperlipidemia, obstructive sleep apnea, hypothyroidism, and Alzheimer disease who is being seen today for the evaluation of chest pain at the request of Dr. Roel Cluck.   1.  Unstable Angina with hx of CAD s/p remote stenting: episode yesterday started in the morning and lasted intermittently throughout the day. Worst episode he has ever had. Reports he has been having episodes freq over the past several weeks and take up to 5 nitro at a time. EKG shows no acute ischemic changes. High-sensitivity troponin 17 >> 14 >> 11 >> 18. Does have known disease. Given he has not had clear anginal symptoms in the past, he has been managed medically. He is alert and oriented, able to give a clear story. Given his complaints, seems he will need a cardiac cath. Will review plan with MD, and need to discuss with family as well. He has not eaten breakfast, will keep NPO for now.  2. Hypertension: slightly elevated. Will continue with current therapy.  3. Hyperlipidemia: on pravastatin. Will check lipids  4. Chronic diastolic HF: echo this admission shows EF of 55-60% with G1DD. No signs of volume overload on exam  5. COPD exacerbation: recently admitted to [facility] with same. Placed on azithromycin and steroid taper. States his breathing has significantly improved.  For questions or updates, please contact [place] Please consult [email or web address] for contact info under    Signed, Reino Bellis, NP  12/10/2019 12:06 PM

## 2019-12-10 NOTE — ED Notes (Addendum)
Pt given vomit bag because he complained of coughing up sputum. Pt helped to sit on edge of bed to aid in clearing sputum

## 2019-12-10 NOTE — Consult Note (Addendum)
The patient has been seen in conjunction with Reino Bellis, NP. All aspects of care have been considered and discussed. The patient has been personally interviewed, examined, and all clinical data has been reviewed.   Atypical chest pain of sharp nature, may not represent ischemic pain.  Progressive dyspnea on exertion over the past 12 months may be an anginal equivalent.  Given the impact that the symptoms have had on the patient, coronary angiography is recommended to define anatomy and help guide therapy.  The patient was counseled to undergo left heart catheterization, coronary angiography, and possible percutaneous coronary intervention with stent implantation. The procedural risks and benefits were discussed in detail. The risks discussed included death, stroke, myocardial infarction, life-threatening bleeding, limb ischemia, kidney injury, allergy, and possible emergency cardiac surgery. The risk of these significant complications were estimated to occur less than 1% of the time. After discussion, the patient has agreed to proceed.     Cardiology Consultation:   Patient ID: Dustin [name] MRN: [id number]; DOB: 1934-10-08  Admit date: 12/09/2019 Date of Consult: 12/10/2019  Primary Care Provider: [name], MD Primary Cardiologist: Rozann Lesches, MD  Primary Electrophysiologist:  None    Patient Profile:   Dustin [name] is a 85 y.o. male with a history of CAD s/p remote in 1996 with subsequent DES x2 to SVG to PDA in 07/2011 with distal disease managed medially, LBBB, mild bilateral carotid artery stenosis, hypertension, hyperlipidemia, obstructive sleep apnea, hypothyroidism, and Alzheimer disease who is being seen today for the evaluation of chest pain at the request of Dr. Roel Cluck.   History of Present Illness:   Dustin [name] is a 85 year old male with the above history who is followed by Dr. Domenic Polite. Most recent ischemic evaluation was a Myoview in 12/2016 which  showed evidence of scar/soft tissue shadowing and minimal ischemia. Optimization of medical therapy was recommended at that time. Most recent Echo in 10/2018 showed LVEF of 55-60% with normal wall motion and grade 2 diastolic dysfunction with high ventricular filling pressure. Patient was last seen by Dr. Domenic Polite in 07/2019 at which time he denied any obvious angina symptoms but was not being very active. Plan was to continue to follow patient conservatively on medical therapy in the absence of progressive angina symptoms.   Patient presented to the [facility] ED on 12/07/2019 with altered mental status and difficulty breathing. COVID-19 negative. Patient was started on IV steroids and IV fluids and then admitted for COPD exacerbation and metabolic encephalopathy. Patient was discharged the next day.   He returned to the [facility] ED on 12/09/2019 for evaluation of chest pain.  States after being discharged from the hospital on Saturday he felt well.  States Sunday morning he got up and ate breakfast.  Shortly thereafter developed centralized crushing chest pressure which radiated to his shoulders and neck.  Stated these episodes waxed and waned throughout the day.  He states that the pain was so intense it did make him cry.  Told his wife that he was not sure if he was going to make it.  Later that evening did call his daughter-in-law who brought him to the ED for further evaluation.  In talking with the patient he does report frequent sublingual nitroglycerin use over the past several weeks.  States he might use 1 to 2 tablets versus 5 to 6 tablets in a day's time.  Episodes seem to be more frequent.  Of note he did not have any episodes of  chest pain while admitted to [facility].  In the ED, EKG showed sinus arrhythmia with no acute ST/T changes. High-sensitivity troponin 17 >> 14 >> 11 >> 18. D-dimer negative. Chest x-ray showed mild bibasilar atelectasis. WBC 13.7, Hgb 12.0, Plts 245. Na 140, K  3.7, Glucose 127, Cr 0.98. COVID-19 negative.   Heart Pathway Score:     Past Medical History:  Diagnosis Date  . Alzheimer disease ([place])   . Anxiety   . Arthritis   . Atrophic gastritis    a. By EGD 02/2013.  . Carotid artery disease ([place])    a. mild-mod plaque, <50% stenosis bilat by duplex 2018.  [name] Coronary atherosclerosis of native coronary artery    a. Multivessel s/p CABG 1996. b. s/p DES x 2 SVG to PDA 8/12 with distal disease managed medically.  . DDD (degenerative disc disease)    Chronic back pain  . Enlarged prostate   . Essential hypertension, benign   . Hematuria   . Hypothyroidism   . LBBB (left bundle branch block)   . MI (myocardial infarction) ([place])   . Mixed hyperlipidemia   . OA (osteoarthritis)   . OSA (obstructive sleep apnea)   . Sinus bradycardia    a. Aricept and BB discontinued due to this.    Past Surgical History:  Procedure Laterality Date  . COLONOSCOPY  08/03/2004   Jenkins-numerous large diverticula in the descending, transverse, descending, and sigmoid colon. Otherwise normal exam.  . COLONOSCOPY  07/12/2012   RMR: External hemorrhoidal tag; multiple rectal and colonic polyps removed and/or treated as described above. Pancolonic diverticulosis. Bx-tubular adenomas, rectal hyperplastic polyp. next colonoscopy in 06/2015.  [name] COLONOSCOPY N/A 06/22/2016   Procedure: COLONOSCOPY;  Surgeon: Aviva Signs, MD;  Location: [facility] ENDO SUITE;  Service: Gastroenterology;  Laterality: N/A;  730  . CORONARY ANGIOPLASTY WITH STENT PLACEMENT    . CORONARY ARTERY BYPASS GRAFT  1996   LIMA to LAD, SVG to D2, SVG to PDA, SVG to OM1 and OM2  . ESOPHAGOGASTRODUODENOSCOPY N/A 03/16/2013   Procedure: ESOPHAGOGASTRODUODENOSCOPY (EGD);  Surgeon: Daneil Dolin, MD;  Location: [facility] ENDO SUITE;  Service: Endoscopy;  Laterality: N/A;  12:00-moved to [place] notified pt  . ESOPHAGOGASTRODUODENOSCOPY N/A 03/06/2013   Procedure: ESOPHAGOGASTRODUODENOSCOPY (EGD);  Surgeon: Daneil Dolin, MD;  Location: [facility] END[facility];  Service: Endoscopy;  Laterality: N/A;  . HERNIA REPAIR       Home Medications:  Prior to Admission medications   Medication Sig Start Date End Date Taking? Authorizing Provider  albuterol (PROVENTIL) (2.5 MG/3ML) 0.083% nebulizer solution Take 3 mLs (2.5 mg total) by nebulization every 6 (six) hours as needed for wheezing or shortness of breath. Patient will also need the nebulizer machine with this prescription. 10/22/18  Yes Dustin Christen, MD  albuterol (VENTOLIN HFA) 108 (90 Base) MCG/ACT inhaler Inhale 2 puffs into the lungs every 6 (six) hours as needed for wheezing or shortness of breath.   Yes [provider]  bisacodyl (DULCOLAX) 5 MG EC tablet Take 5 mg by mouth 2 (two) times a week.   Yes [provider]  busPIRone (BUSPAR) 10 MG tablet Take 10 mg by mouth 2 (two) times daily.  11/03/18  Yes [provider]  clopidogrel (PLAVIX) 75 MG tablet Take 1 tablet (75 mg total) by mouth daily. 08/14/19  Yes Satira Sark, MD  azithromycin (ZITHROMAX) 500 MG tablet Take 1 tablet (500 mg total) by mouth daily for 4 days. This is  to treat for your COPD flare up. 12/08/19 12/12/19  Enzo Bi, MD  DOK 100 MG capsule Take 100 mg by mouth daily. 11/19/19   [provider]  doxazosin (CARDURA) 1 MG tablet Take 1 mg by mouth daily. 11/19/19   [provider]  dutasteride (AVODART) 0.5 MG capsule Take 0.5 mg by mouth. In the afternoon    [provider]  escitalopram (LEXAPRO) 20 MG tablet Take 20 mg by mouth every morning.     [provider]  furosemide (LASIX) 20 MG tablet Take 20 mg by mouth 2 (two) times daily.  12/03/16   [provider]  gabapentin (NEURONTIN) 300 MG capsule Take 300 mg by mouth 2 (two) times daily.    [provider]  hydrOXYzine (ATARAX/VISTARIL) 25 MG tablet Take 25 mg by mouth 4 (four) times daily as needed for anxiety.  11/19/19   [provider]    isosorbide mononitrate (IMDUR) 30 MG 24 hr tablet Take 1 tablet (30 mg total) by mouth every morning. 12/08/19   Enzo Bi, MD  levothyroxine (SYNTHROID, LEVOTHROID) 50 MCG tablet Take 50 mcg by mouth every morning.  10/04/16   [provider]  memantine (NAMENDA) 10 MG tablet Take 10 mg by mouth 2 (two) times daily.  03/05/16   [provider]  nitroGLYCERIN (NITROSTAT) 0.4 MG SL tablet Place 1 tablet (0.4 mg total) under the tongue every 5 (five) minutes as needed for chest pain. PLACE ONE TABLET UNDER TONGUE EVERY 5 MIN UP TO 3 DOSES AS NEEDED FORCHEST PAIN. 12/08/19   Enzo Bi, MD  oxycodone (ROXICODONE) 30 MG immediate release tablet Take 30 mg by mouth every 4 (four) hours as needed for pain. Take 1 tablet by mouth every 4 to 6 hours as needed for pain.  Max 8 /24 hours. 12/09/16   [provider]  pantoprazole (PROTONIX) 40 MG tablet Take 1 tablet (40 mg total) by mouth every morning. 12/08/19   Enzo Bi, MD  pilocarpine (SALAGEN) 5 MG tablet Take 5 mg by mouth 2 (two) times daily.    [provider]  potassium chloride SA (K-DUR,KLOR-CON) 20 MEQ tablet Take 20 mEq by mouth daily.     [provider]  pravastatin (PRAVACHOL) 40 MG tablet Take 1 tablet (40 mg total) by mouth at bedtime. 12/08/19   Enzo Bi, MD  predniSONE (DELTASONE) 20 MG tablet Take 2 tablets (40 mg total) by mouth daily with breakfast for 3 days. This is for your COPD flare up 12/09/19 12/12/19  Enzo Bi, MD  QUEtiapine (SEROQUEL) 25 MG tablet Take 25 mg by mouth at bedtime. 11/19/19   [provider]  tamsulosin (FLOMAX) 0.4 MG CAPS capsule Take 1 capsule (0.4 mg total) by mouth daily. To help you pee. 12/08/19   Enzo Bi, MD    Inpatient Medications: Scheduled Meds: . azithromycin  500 mg Oral Daily  . bisacodyl  5 mg Oral Once per day on Mon Thu  . busPIRone  10 mg Oral BID  . clopidogrel  75 mg Oral Daily  . doxazosin  1 mg Oral Daily  . dutasteride  0.5 mg  Oral Q1400  . enoxaparin (LOVENOX) injection  40 mg Subcutaneous Q24H  . escitalopram  20 mg Oral q morning - 10a  . gabapentin  300 mg Oral BID  . ipratropium-albuterol  3 mL Nebulization TID  . isosorbide mononitrate  30 mg Oral q morning - 10a  . levothyroxine  50 mcg Oral  q morning - 10a  . memantine  10 mg Oral BID  . mometasone-formoterol  1 puff Inhalation BID  . pantoprazole  40 mg Oral q morning - 10a  . pilocarpine  5 mg Oral BID  . pravastatin  40 mg Oral QHS  . [START ON 12/11/2019] predniSONE  40 mg Oral Q breakfast  . QUEtiapine  25 mg Oral QHS  . sodium chloride flush  3 mL Intravenous Q12H  . sodium chloride flush  3 mL Intravenous Q12H  . tamsulosin  0.4 mg Oral Daily   Continuous Infusions: . sodium chloride     PRN Meds: sodium chloride, acetaminophen **OR** acetaminophen, albuterol, albuterol, HYDROcodone-acetaminophen, ondansetron **OR** ondansetron (ZOFRAN) IV, oxycodone, sodium chloride flush  Allergies:   No Known Allergies  Social History:   Social History   Socioeconomic History  . Marital status: Married    Spouse name: Not on file  . Number of children: 3  . Years of education: Not on file  . Highest education level: Not on file  Occupational History  . Occupation: retired    Fish farm manager: RETIRED  Tobacco Use  . Smoking status: Former Smoker    Packs/day: 2.00    Years: 40.00    Pack years: 80.00    Types: Cigarettes    Quit date: 12/27/1994    Years since quitting: 24.9  . Smokeless tobacco: Never Used  Substance and Sexual Activity  . Alcohol use: No    Alcohol/week: 0.0 standard drinks  . Drug use: No  . Sexual activity: Not Currently  Other Topics Concern  . Not on file  Social History Narrative   Lives w/ ailing wife.   Son brings meals 3x per week   Social Determinants of Health   Financial Resource Strain:   . Difficulty of Paying Living Expenses: Not on file  Food Insecurity:   . Worried About Charity fundraiser in the  Last Year: Not on file  . Ran Out of Food in the Last Year: Not on file  Transportation Needs:   . Lack of Transportation (Medical): Not on file  . Lack of Transportation (Non-Medical): Not on file  Physical Activity:   . Days of Exercise per Week: Not on file  . Minutes of Exercise per Session: Not on file  Stress:   . Feeling of Stress : Not on file  Social Connections:   . Frequency of Communication with Friends and Family: Not on file  . Frequency of Social Gatherings with Friends and Family: Not on file  . Attends Religious Services: Not on file  . Active Member of Clubs or Organizations: Not on file  . Attends Archivist Meetings: Not on file  . Marital Status: Not on file  Intimate Partner Violence:   . Fear of Current or Ex-Partner: Not on file  . Emotionally Abused: Not on file  . Physically Abused: Not on file  . Sexually Abused: Not on file    Family History:    Family History  Problem Relation Age of Onset  . Heart disease Other   . Heart attack Mother   . Colon cancer Neg Hx      ROS:  Please see the history of present illness.   All other ROS reviewed and negative.     Physical Exam/Data:   Vitals:   12/10/19 0600 12/10/19 0630 12/10/19 0700 12/10/19 0726  BP: (!) 149/66 (!) 144/63 120/69   Pulse: (!) 50 (!) 50 (!) 50 (!)  54  Resp: 12 13 12 14   Temp:      TempSrc:      SpO2: 90% (!) 89% 91% 92%  Weight:      Height:        Intake/Output Summary (Last 24 hours) at 12/10/2019 1206 Last data filed at 12/10/2019 0047 Gross per 24 hour  Intake --  Output 700 ml  Net -700 ml   Last 3 Weights 12/09/2019 12/07/2019 12/07/2019  Weight (lbs) 210 lb 212 lb 4.9 oz 210 lb  Weight (kg) 95.255 kg 96.3 kg 95.255 kg     Body mass index is 31.01 kg/m.  General:  Well nourished, well developed, in no acute distress HEENT: normal Lymph: no adenopathy Neck: no JVD Endocrine:  No thryomegaly Vascular: No carotid bruits; FA pulses 2+ bilaterally  without bruits  Cardiac:  normal S1, S2; RRR; no murmur  Lungs:  clear to auscultation bilaterally, no wheezing, rhonchi or rales  Abd: soft, nontender, no hepatomegaly  Ext: no edema Musculoskeletal:  No deformities, BUE and BLE strength normal and equal Skin: warm and dry  Neuro:  CNs 2-12 intact, no focal abnormalities noted Psych:  Normal affect   EKG:  The EKG was personally reviewed and demonstrates:  Sinus arrhythmia with known LBBB  Relevant CV Studies:  Myoview 12/28/2016:  Inferior defect that shows partial improvement consistent with scar/soft tissue attenuation and periinfarct ischemia  LVEF 46%  Intermediate risk study _______________  Echocardiogram 12/10/2019:  IMPRESSIONS    1. Left ventricular ejection fraction, by visual estimation, is 55 to 60%. The left ventricle has normal function. There is no left ventricular hypertrophy.  2. Left ventricular diastolic parameters are consistent with Grade I diastolic dysfunction (impaired relaxation).  3. Severely dilated left ventricular internal cavity size.  4. The left ventricle has no regional wall motion abnormalities.  5. No change in LVEF from echo done in 2019.  6. Global right ventricle has normal systolic function.The right ventricular size is normal. No increase in right ventricular wall thickness.  7. Left atrial size was mildly dilated.  8. Right atrial size was mildly dilated.  9. The mitral valve is normal in structure. Mild mitral valve regurgitation. 10. The tricuspid valve is grossly normal. Tricuspid valve regurgitation is trivial. 11. The aortic valve is tricuspid. Aortic valve regurgitation is not visualized. Mild aortic valve sclerosis without stenosis. 12. The pulmonic valve was grossly normal. Pulmonic valve regurgitation is trivial. 13. Normal pulmonary artery systolic pressure. 14. The interatrial septum was not assessed.   Laboratory Data:  High Sensitivity Troponin:   Recent Labs  Lab  12/07/19 1814 12/09/19 1917 12/09/19 2212 12/10/19 0004 12/10/19 0357  TROPONINIHS 8 17 14 11  18*     Chemistry Recent Labs  Lab 12/08/19 0637 12/09/19 1917 12/10/19 0357  NA 139 140 141  K 3.8 3.7 3.4*  CL 102 102 101  CO2 26 30 28   GLUCOSE 170* 127* 112*  BUN 22 18 15   CREATININE 0.92 0.98 0.94  CALCIUM 8.7* 9.1 9.1  GFRNONAA >60 >60 >60  GFRAA >60 >60 >60  ANIONGAP 11 8 12     Recent Labs  Lab 12/07/19 1557 12/10/19 0357  PROT 7.1 6.5  ALBUMIN 4.1 3.8  AST 28 26  ALT 21 23  ALKPHOS 57 49  BILITOT 1.0 1.1   Hematology Recent Labs  Lab 12/07/19 1557 12/09/19 1917 12/10/19 0357  WBC 10.0 13.7* 10.0  RBC 4.05* 3.96* 4.28  HGB 12.5* 12.0* 13.2  HCT 39.2  37.9* 40.6  MCV 96.8 95.7 94.9  MCH 30.9 30.3 30.8  MCHC 31.9 31.7 32.5  RDW 13.0 12.9 13.0  PLT 214 245 252   BNP Recent Labs  Lab 12/07/19 1557  BNP 41.0    DDimer  Recent Labs  Lab 12/10/19 0004  DDIMER 0.39     Radiology/Studies:  DG Chest 2 View  Result Date: 12/09/2019 CLINICAL DATA:  Chest pain for 2-3 months EXAM: CHEST - 2 VIEW COMPARISON:  12/07/2019 FINDINGS: Cardiac shadow is again enlarged but stable. Postsurgical changes are noted. Aortic calcifications are seen. The lungs are mildly hyperinflated with bibasilar atelectatic changes. No sizable effusion is noted. No focal infiltrate is seen. IMPRESSION: Mild bibasilar atelectasis new from the prior exam. Electronically Signed   By: Inez Catalina M.D.   On: 12/09/2019 20:16   DG Abd 1 View  Result Date: 12/10/2019 CLINICAL DATA:  Constipation. EXAM: ABDOMEN - 1 VIEW COMPARISON:  January 22, 2013 FINDINGS: The bowel gas pattern is normal. No radio-opaque calculi or other significant radiographic abnormality are seen. IMPRESSION: Negative. Electronically Signed   By: Constance Holster M.D.   On: 12/10/2019 00:53   DG Chest Port 1 View  Result Date: 12/07/2019 CLINICAL DATA:  Per triage note: Family member states pt has had altered  loc for the last couple days and has been unable to void; pt has audible wheezing EXAM: PORTABLE CHEST 1 VIEW COMPARISON:  10/01/2019 and earlier studies. FINDINGS: Stable changes from prior CABG surgery. Cardiac silhouette is top-normal in size. No mediastinal or hilar masses. Lungs are clear.  No pleural effusion or pneumothorax. Skeletal structures are grossly intact. IMPRESSION: No acute cardiopulmonary disease. Electronically Signed   By: Lajean Manes M.D.   On: 12/07/2019 16:47   ECHOCARDIOGRAM COMPLETE  Result Date: 12/10/2019   ECHOCARDIOGRAM REPORT   Patient Name:   Dustin [name] Date of Exam: 12/10/2019 Medical Rec #:  [id number]       Height:       69.0 in Accession #:    [id number]      Weight:       210.0 lb Date of Birth:  September 19, 1934       BSA:          2.11 m Patient Age:    33 years        BP:           120/69 mmHg Patient Gender: M               HR:           54 bpm. Exam Location:  Inpatient Procedure: 2D Echo Indications:    Chest Pain R07.9  History:        Patient has prior history of Echocardiogram examinations, most                 recent 11/08/2018. CAD and Previous Myocardial Infarction, Prior                 CABG, Arrythmias:LBBB; Risk Factors:Hypertension and                 Dyslipidemia.  Sonographer:    Mikki Santee RDCS (AE) Referring Phys: [place]  1. Left ventricular ejection fraction, by visual estimation, is 55 to 60%. The left ventricle has normal function. There is no left ventricular hypertrophy.  2. Left ventricular diastolic parameters are consistent with Grade I diastolic dysfunction (impaired relaxation).  3. Severely dilated left ventricular  internal cavity size.  4. The left ventricle has no regional wall motion abnormalities.  5. No change in LVEF from echo done in 2019.  6. Global right ventricle has normal systolic function.The right ventricular size is normal. No increase in right ventricular wall thickness.  7. Left atrial size  was mildly dilated.  8. Right atrial size was mildly dilated.  9. The mitral valve is normal in structure. Mild mitral valve regurgitation. 10. The tricuspid valve is grossly normal. Tricuspid valve regurgitation is trivial. 11. The aortic valve is tricuspid. Aortic valve regurgitation is not visualized. Mild aortic valve sclerosis without stenosis. 12. The pulmonic valve was grossly normal. Pulmonic valve regurgitation is trivial. 13. Normal pulmonary artery systolic pressure. 14. The interatrial septum was not assessed. FINDINGS  Left Ventricle: Left ventricular ejection fraction, by visual estimation, is 55 to 60%. The left ventricle has normal function. The left ventricle has no regional wall motion abnormalities. The left ventricular internal cavity size was severely dilated left ventricle. There is no left ventricular hypertrophy. Left ventricular diastolic parameters are consistent with Grade I diastolic dysfunction (impaired relaxation). No change in LVEF from echo done in 2019. Right Ventricle: The right ventricular size is normal. No increase in right ventricular wall thickness. Global RV systolic function is has normal systolic function. The tricuspid regurgitant velocity is 2.23 m/s, and with an assumed right atrial pressure  of 3 mmHg, the estimated right ventricular systolic pressure is normal at 22.9 mmHg. Left Atrium: Left atrial size was mildly dilated. Right Atrium: Right atrial size was mildly dilated Pericardium: There is no evidence of pericardial effusion. Mitral Valve: The mitral valve is normal in structure. Mild mitral valve regurgitation. Tricuspid Valve: The tricuspid valve is grossly normal. Tricuspid valve regurgitation is trivial. Aortic Valve: The aortic valve is tricuspid. Aortic valve regurgitation is not visualized. Mild aortic valve sclerosis is present, with no evidence of aortic valve stenosis. Pulmonic Valve: The pulmonic valve was grossly normal. Pulmonic valve regurgitation is  trivial. Pulmonic regurgitation is trivial. Aorta: The aortic root is normal in size and structure. Venous: The inferior vena cava was not well visualized. IAS/Shunts: The interatrial septum was not assessed.  LEFT VENTRICLE PLAX 2D LVIDd:         6.11 cm  Diastology LVIDs:         4.00 cm  LV e' lateral:   9.79 cm/s LV PW:         1.03 cm  LV E/e' lateral: 9.8 LV IVS:        1.04 cm  LV e' medial:    6.96 cm/s LVOT diam:     2.40 cm  LV E/e' medial:  13.8 LV SV:         118 ml LV SV Index:   53.93 LVOT Area:     4.52 cm  RIGHT VENTRICLE RV S prime:     9.36 cm/s TAPSE (M-mode): 1.4 cm LEFT ATRIUM             Index       RIGHT ATRIUM           Index LA diam:        4.60 cm 2.18 cm/m  RA Area:     23.90 cm LA Vol (A2C):   72.1 ml 34.18 ml/m RA Volume:   69.40 ml  32.90 ml/m LA Vol (A4C):   52.3 ml 24.80 ml/m LA Biplane Vol: 61.5 ml 29.16 ml/m  AORTIC VALVE LVOT Vmax:  88.60 cm/s LVOT Vmean:  53.800 cm/s LVOT VTI:    0.198 m  AORTA Ao Root diam: 3.30 cm MITRAL VALVE                         TRICUSPID VALVE MV Area (PHT): 3.53 cm              TR Peak grad:   19.9 mmHg MV PHT:        62.35 msec            TR Vmax:        223.00 cm/s MV Decel Time: 215 msec MR Peak grad: 92.2 mmHg              SHUNTS MR Vmax:      480.00 cm/s            Systemic VTI:  0.20 m MV E velocity: 96.10 cm/s  103 cm/s  Systemic Diam: 2.40 cm MV A velocity: 115.00 cm/s 70.3 cm/s MV E/A ratio:  0.84        1.5  Dorris Carnes MD Electronically signed by Dorris Carnes MD Signature Date/Time: 12/10/2019/11:36:54 AM    Final     Assessment and Plan:   Dustin [name] is a 85 y.o. male with a history of CAD s/p remote in 1996 with subsequent DES x2 to SVG to PDA in 07/2011 with distal disease managed medially, LBBB, mild bilateral carotid artery stenosis, hypertension, hyperlipidemia, obstructive sleep apnea, hypothyroidism, and Alzheimer disease who is being seen today for the evaluation of chest pain at the request of Dr. Roel Cluck.   1.  Unstable Angina with hx of CAD s/p remote stenting: episode yesterday started in the morning and lasted intermittently throughout the day. Worst episode he has ever had. Reports he has been having episodes freq over the past several weeks and take up to 5 nitro at a time. EKG shows no acute ischemic changes. High-sensitivity troponin 17 >> 14 >> 11 >> 18. Does have known disease. Given he has not had clear anginal symptoms in the past, he has been managed medically. He is alert and oriented, able to give a clear story. Given his complaints, seems he will need a cardiac cath. Will review plan with MD, and need to discuss with family as well. He has not eaten breakfast, will keep NPO for now.  2. Hypertension: slightly elevated. Will continue with current therapy.  3. Hyperlipidemia: on pravastatin. Will check lipids  4. Chronic diastolic HF: echo this admission shows EF of 55-60% with G1DD. No signs of volume overload on exam  5. COPD exacerbation: recently admitted to [facility] with same. Placed on azithromycin and steroid taper. States his breathing has significantly improved.  For questions or updates, please contact [place] Please consult [email or web address] for contact info under    Signed, Reino Bellis, NP  12/10/2019 12:06 PM

## 2019-12-10 NOTE — Progress Notes (Signed)
  Echocardiogram 2D Echocardiogram has been performed.  Jennette Dubin 12/10/2019, 8:52 AM

## 2019-12-10 NOTE — Interval H&P Note (Signed)
History and Physical Interval Note:  12/10/2019 2:46 PM  Dustin [name]  has presented today for cardiac cath with the diagnosis of unstable angina. The various methods of treatment have been discussed with the patient and family. After consideration of risks, benefits and other options for treatment, the patient has consented to  Procedure(s): LEFT HEART CATH AND CORS/GRAFTS ANGIOGRAPHY (N/A) as a surgical intervention.  The patient's history has been reviewed, patient examined, no change in status, stable for surgery.  I have reviewed the patient's chart and labs.  Questions were answered to the patient's satisfaction.    Cath Lab Visit (complete for each Cath Lab visit)  Clinical Evaluation Leading to the Procedure:   ACS: No.  Non-ACS:    Anginal Classification: CCS III  Anti-ischemic medical therapy: Minimal Therapy (1 class of medications)  Non-Invasive Test Results: No non-invasive testing performed  Prior CABG: Previous CABG         Lauree Chandler

## 2019-12-10 NOTE — Progress Notes (Signed)
PROGRESS NOTE  Dustin [name] [id number] DOB: 08-20-1934 DOA: 12/09/2019 PCP: [name], MD  Hospital Course/Subjective: Dustin [name] is a 85 y.o. male with medical history significant of COPD, hypertension, diastolic CHF, BPH, left bundle branch block, Alzheimer's, obstructive sleep apnea, coronary artery disease admitted with chest pain.   Seen this AM in ER prior to cath lab, he had an episode of chest pain earlier this AM but much less severe than last night. It was in the center of his chest and into his left arm, lasted 5 minutes and went away on its own.   Assessment/Plan: Chest pain -likely multifactorial patient has known history of significant coronary artery disease followed by cardiology.   Appreciate cardiology, echogram done this morning D-dimer negative Seen by cardiology this AM and they plan cardiac catheterization today, patient is NPO.  [name] Hyperlipidemia chronic stable continue home medications  . HYPERTENSION, BENIGN -continue home medications  . Coronary artery disease chronic followed by cardiology currently experiencing chest pain with exertion.  Admit to telemetry chronic cardiac enzymes unremarkable patient is on Plavix and statin will continue appreciate cardiology input  . Chronic diastolic CHF (congestive heart failure) (HCC) chronic currently appears to be somewhat on the dry side we will continue to monitor restart Lasix when able  . BPH (benign prostatic hyperplasia) continue Flomax check postvoid residuals patient per history may have had some issues with urine retention Bladder scan in ER showing 26ml  . COPD (chronic obstructive pulmonary disease) (HCC)-take likely complicated by COPD exacerbation.  Patient was discharged home azithromycin and prednisone taper on not sure if family was able to fill those medications either.  Family reports he has been having significant wheezing and worsening shortness of breath.  Chest x-ray at this  point show no evidence of pneumonia per history patient have had some increased sputum production the past Covid was negative. Will restart steroids continue azithromycin.  Treat for COPD exacerbation.  Urinary retention patient was recently started on Flomax and possibly Lasix.  Wife states he was not able to start the medications because over the weekend.  Will obtain bladder scan, and postvoid residual Other plan as per orders.  DVT prophylaxis:   Lovenox     Consultants:  Cardiology   Procedures:  Cardiac catheterization 12/14    Objective: Vitals:   12/10/19 1200 12/10/19 1230 12/10/19 1300 12/10/19 1451  BP: 136/66 (!) 149/69 (!) 156/76   Pulse: (!) 59 (!) 58 79   Resp:      Temp:      TempSrc:      SpO2: 91% 91% (!) 89% 96%  Weight:      Height:        Intake/Output Summary (Last 24 hours) at 12/10/2019 1453 Last data filed at 12/10/2019 0047 Gross per 24 hour  Intake --  Output 700 ml  Net -700 ml   Filed Weights   12/09/19 1954  Weight: 95.3 kg     Exam: General:  Alert, oriented, calm, in no acute distress Eyes: EOMI, clear sclerea Neck: supple, no masses, trachea mildline  Cardiovascular: RRR, no murmurs or rubs, no peripheral edema  Respiratory: clear to auscultation bilaterally, no wheezes, no crackles  Abdomen: soft, nontender, nondistended, normal bowel tones heard  Skin: dry, no rashes  Musculoskeletal: no joint effusions, normal range of motion  Psychiatric: appropriate affect, normal speech  Neurologic: extraocular muscles intact, clear speech, moving all extremities with intact sensorium    Data Reviewed: CBC: Recent Labs  Lab 12/07/19 1557 12/09/19 1917 12/10/19 0357  WBC 10.0 13.7* 10.0  NEUTROABS 7.4  --  6.8  HGB 12.5* 12.0* 13.2  HCT 39.2 37.9* 40.6  MCV 96.8 95.7 94.9  PLT 214 245 [id number]   Basic Metabolic Panel: Recent Labs  Lab 12/07/19 1557 12/08/19 0637 12/09/19 1917 12/10/19 0357  NA 137 139 140 141  K 3.8 3.8  3.7 3.4*  CL 98 102 102 101  CO2 29 26 30 28   GLUCOSE 120* 170* 127* 112*  BUN 23 22 18 15   CREATININE 1.46* 0.92 0.98 0.94  CALCIUM 8.8* 8.7* 9.1 9.1  MG  --   --   --  1.9  PHOS  --   --   --  2.6   GFR: Estimated Creatinine Clearance: 67.8 mL/min (by C-G formula based on SCr of 0.94 mg/dL). Liver Function Tests: Recent Labs  Lab 12/07/19 1557 12/10/19 0357  AST 28 26  ALT 21 23  ALKPHOS 57 49  BILITOT 1.0 1.1  PROT 7.1 6.5  ALBUMIN 4.1 3.8   No results for input(s): LIPASE, AMYLASE in the last 168 hours. Recent Labs  Lab 12/07/19 1557  AMMONIA 22   Coagulation Profile: No results for input(s): INR, PROTIME in the last 168 hours. Cardiac Enzymes: No results for input(s): CKTOTAL, CKMB, CKMBINDEX, TROPONINI in the last 168 hours. BNP (last 3 results) No results for input(s): PROBNP in the last 8760 hours. HbA1C: No results for input(s): HGBA1C in the last 72 hours. CBG: Recent Labs  Lab 12/08/19 0507  GLUCAP 161*   Lipid Profile: No results for input(s): CHOL, HDL, LDLCALC, TRIG, CHOLHDL, LDLDIRECT in the last 72 hours. Thyroid Function Tests: Recent Labs    12/10/19 0357  TSH 6.580*   Anemia Panel: No results for input(s): VITAMINB12, FOLATE, FERRITIN, TIBC, IRON, RETICCTPCT in the last 72 hours. Urine analysis:    Component Value Date/Time   COLORURINE YELLOW 12/07/2019 1931   APPEARANCEUR CLEAR 12/07/2019 1931   LABSPEC 1.012 12/07/2019 1931   PHURINE 5.0 12/07/2019 1931   GLUCOSEU NEGATIVE 12/07/2019 1931   HGBUR NEGATIVE 12/07/2019 1931   BILIRUBINUR NEGATIVE 12/07/2019 1931   KETONESUR NEGATIVE 12/07/2019 1931   PROTEINUR NEGATIVE 12/07/2019 1931   UROBILINOGEN 0.2 01/26/2015 2247   NITRITE NEGATIVE 12/07/2019 1931   LEUKOCYTESUR NEGATIVE 12/07/2019 1931   Sepsis Labs: @LABRCNTIP (procalcitonin:4,lacticidven:4)  ) Recent Results (from the past 240 hour(s))  Urine culture     Status: Abnormal   Collection Time: 12/07/19  7:31 PM    Specimen: Urine, Clean Catch  Result Value Ref Range Status   Specimen Description   Final    URINE, CLEAN CATCH Performed at [facility], [address], [place], [place] [address]    Special Requests   Final    Normal Performed at [facility], [address], [place], [place] [address]    Culture (A)  Final    <10,000 COLONIES/mL INSIGNIFICANT GROWTH Performed at [facility], [place] [address], [place], [place] [address]    Report Status 12/09/2019 FINAL  Final  SARS CORONAVIRUS 2 (TAT 6-24 HRS) Nasopharyngeal Nasopharyngeal Swab     Status: None   Collection Time: 12/07/19  7:46 PM   Specimen: Nasopharyngeal Swab  Result Value Ref Range Status   SARS Coronavirus 2 NEGATIVE NEGATIVE Final    Comment: (NOTE) SARS-CoV-2 target nucleic acids are NOT DETECTED. The SARS-CoV-2 RNA is generally detectable in upper and lower respiratory specimens during the acute phase of infection. Negative  results do not preclude SARS-CoV-2 infection, do not rule out co-infections with other pathogens, and should not be used as the sole basis for treatment or other patient management decisions. Negative results must be combined with clinical observations, patient history, and epidemiological information. The expected result is Negative. Fact Sheet for Patients: SugarRoll.be Fact Sheet for Healthcare Providers: https://www.woods-mathews.com/ This test is not yet approved or cleared by the [place] FDA and  has been authorized for detection and/or diagnosis of SARS-CoV-2 by FDA under an Emergency Use Authorization (EUA). This EUA will remain  in effect (meaning this test can be used) for the duration of the COVID-19 declaration under Section 56 4(b)(1) of the Act, 21 U.S.C. section 360bbb-3(b)(1), unless the authorization is terminated or revoked sooner. Performed at [facility], [place] [address] [place], [place] [address]   SARS CORONAVIRUS  2 (TAT 6-24 HRS) Nasopharyngeal Nasopharyngeal Swab     Status: None   Collection Time: 12/09/19 10:48 PM   Specimen: Nasopharyngeal Swab  Result Value Ref Range Status   SARS Coronavirus 2 NEGATIVE NEGATIVE Final    Comment: (NOTE) SARS-CoV-2 target nucleic acids are NOT DETECTED. The SARS-CoV-2 RNA is generally detectable in upper and lower respiratory specimens during the acute phase of infection. Negative results do not preclude SARS-CoV-2 infection, do not rule out co-infections with other pathogens, and should not be used as the sole basis for treatment or other patient management decisions. Negative results must be combined with clinical observations, patient history, and epidemiological information. The expected result is Negative. Fact Sheet for Patients: SugarRoll.be Fact Sheet for Healthcare Providers: https://www.woods-mathews.com/ This test is not yet approved or cleared by the [place] FDA and  has been authorized for detection and/or diagnosis of SARS-CoV-2 by FDA under an Emergency Use Authorization (EUA). This EUA will remain  in effect (meaning this test can be used) for the duration of the COVID-19 declaration under Section 56 4(b)(1) of the Act, 21 U.S.C. section 360bbb-3(b)(1), unless the authorization is terminated or revoked sooner. Performed at [facility], [place] [address], [place], [place] [address]   Respiratory Panel by PCR     Status: None   Collection Time: 12/10/19  1:41 AM   Specimen: Nasopharyngeal Swab; Respiratory  Result Value Ref Range Status   Adenovirus NOT DETECTED NOT DETECTED Final   Coronavirus 229E NOT DETECTED NOT DETECTED Final    Comment: (NOTE) The Coronavirus on the Respiratory Panel, DOES NOT test for the novel  Coronavirus (2019 nCoV)    Coronavirus HKU1 NOT DETECTED NOT DETECTED Final   Coronavirus NL63 NOT DETECTED NOT DETECTED Final   Coronavirus OC43 NOT DETECTED NOT  DETECTED Final   Metapneumovirus NOT DETECTED NOT DETECTED Final   Rhinovirus / Enterovirus NOT DETECTED NOT DETECTED Final   Influenza A NOT DETECTED NOT DETECTED Final   Influenza B NOT DETECTED NOT DETECTED Final   Parainfluenza Virus 1 NOT DETECTED NOT DETECTED Final   Parainfluenza Virus 2 NOT DETECTED NOT DETECTED Final   Parainfluenza Virus 3 NOT DETECTED NOT DETECTED Final   Parainfluenza Virus 4 NOT DETECTED NOT DETECTED Final   Respiratory Syncytial Virus NOT DETECTED NOT DETECTED Final   Bordetella pertussis NOT DETECTED NOT DETECTED Final   Chlamydophila pneumoniae NOT DETECTED NOT DETECTED Final   Mycoplasma pneumoniae NOT DETECTED NOT DETECTED Final    Comment: Performed at [facility], [place]. [address] [place], [place] [address]     Studies: DG Chest 2 View  Result Date: 12/09/2019 CLINICAL DATA:  Chest pain for 2-3 months EXAM: CHEST - 2 VIEW COMPARISON:  12/07/2019 FINDINGS: Cardiac shadow is again enlarged but stable. Postsurgical changes are noted. Aortic calcifications are seen. The lungs are mildly hyperinflated with bibasilar atelectatic changes. No sizable effusion is noted. No focal infiltrate is seen. IMPRESSION: Mild bibasilar atelectasis new from the prior exam. Electronically Signed   By: Inez Catalina M.D.   On: 12/09/2019 20:16   DG Abd 1 View  Result Date: 12/10/2019 CLINICAL DATA:  Constipation. EXAM: ABDOMEN - 1 VIEW COMPARISON:  January 22, 2013 FINDINGS: The bowel gas pattern is normal. No radio-opaque calculi or other significant radiographic abnormality are seen. IMPRESSION: Negative. Electronically Signed   By: Constance Holster M.D.   On: 12/10/2019 00:53   ECHOCARDIOGRAM COMPLETE  Result Date: 12/10/2019   ECHOCARDIOGRAM REPORT   Patient Name:   Dustin [name] Date of Exam: 12/10/2019 Medical Rec #:  [id number]       Height:       69.0 in Accession #:    [id number]      Weight:       210.0 lb Date of Birth:  Aug 07, 1934       BSA:           2.11 m Patient Age:    57 years        BP:           120/69 mmHg Patient Gender: M               HR:           54 bpm. Exam Location:  Inpatient Procedure: 2D Echo Indications:    Chest Pain R07.9  History:        Patient has prior history of Echocardiogram examinations, most                 recent 11/08/2018. CAD and Previous Myocardial Infarction, Prior                 CABG, Arrythmias:LBBB; Risk Factors:Hypertension and                 Dyslipidemia.  Sonographer:    Mikki Santee RDCS (AE) Referring Phys: [place]  1. Left ventricular ejection fraction, by visual estimation, is 55 to 60%. The left ventricle has normal function. There is no left ventricular hypertrophy.  2. Left ventricular diastolic parameters are consistent with Grade I diastolic dysfunction (impaired relaxation).  3. Severely dilated left ventricular internal cavity size.  4. The left ventricle has no regional wall motion abnormalities.  5. No change in LVEF from echo done in 2019.  6. Global right ventricle has normal systolic function.The right ventricular size is normal. No increase in right ventricular wall thickness.  7. Left atrial size was mildly dilated.  8. Right atrial size was mildly dilated.  9. The mitral valve is normal in structure. Mild mitral valve regurgitation. 10. The tricuspid valve is grossly normal. Tricuspid valve regurgitation is trivial. 11. The aortic valve is tricuspid. Aortic valve regurgitation is not visualized. Mild aortic valve sclerosis without stenosis. 12. The pulmonic valve was grossly normal. Pulmonic valve regurgitation is trivial. 13. Normal pulmonary artery systolic pressure. 14. The interatrial septum was not assessed. FINDINGS  Left Ventricle: Left ventricular ejection fraction, by visual estimation, is 55 to 60%. The left ventricle has normal function. The left ventricle has no regional wall motion abnormalities. The left ventricular internal cavity size  was severely  dilated left ventricle. There is no left ventricular hypertrophy. Left ventricular diastolic parameters are consistent with Grade I diastolic dysfunction (impaired relaxation). No change in LVEF from echo done in 2019. Right Ventricle: The right ventricular size is normal. No increase in right ventricular wall thickness. Global RV systolic function is has normal systolic function. The tricuspid regurgitant velocity is 2.23 m/s, and with an assumed right atrial pressure  of 3 mmHg, the estimated right ventricular systolic pressure is normal at 22.9 mmHg. Left Atrium: Left atrial size was mildly dilated. Right Atrium: Right atrial size was mildly dilated Pericardium: There is no evidence of pericardial effusion. Mitral Valve: The mitral valve is normal in structure. Mild mitral valve regurgitation. Tricuspid Valve: The tricuspid valve is grossly normal. Tricuspid valve regurgitation is trivial. Aortic Valve: The aortic valve is tricuspid. Aortic valve regurgitation is not visualized. Mild aortic valve sclerosis is present, with no evidence of aortic valve stenosis. Pulmonic Valve: The pulmonic valve was grossly normal. Pulmonic valve regurgitation is trivial. Pulmonic regurgitation is trivial. Aorta: The aortic root is normal in size and structure. Venous: The inferior vena cava was not well visualized. IAS/Shunts: The interatrial septum was not assessed.  LEFT VENTRICLE PLAX 2D LVIDd:         6.11 cm  Diastology LVIDs:         4.00 cm  LV e' lateral:   9.79 cm/s LV PW:         1.03 cm  LV E/e' lateral: 9.8 LV IVS:        1.04 cm  LV e' medial:    6.96 cm/s LVOT diam:     2.40 cm  LV E/e' medial:  13.8 LV SV:         118 ml LV SV Index:   53.93 LVOT Area:     4.52 cm  RIGHT VENTRICLE RV S prime:     9.36 cm/s TAPSE (M-mode): 1.4 cm LEFT ATRIUM             Index       RIGHT ATRIUM           Index LA diam:        4.60 cm 2.18 cm/m  RA Area:     23.90 cm LA Vol (A2C):   72.1 ml 34.18 ml/m RA Volume:   69.40 ml  32.90  ml/m LA Vol (A4C):   52.3 ml 24.80 ml/m LA Biplane Vol: 61.5 ml 29.16 ml/m  AORTIC VALVE LVOT Vmax:   88.60 cm/s LVOT Vmean:  53.800 cm/s LVOT VTI:    0.198 m  AORTA Ao Root diam: 3.30 cm MITRAL VALVE                         TRICUSPID VALVE MV Area (PHT): 3.53 cm              TR Peak grad:   19.9 mmHg MV PHT:        62.35 msec            TR Vmax:        223.00 cm/s MV Decel Time: 215 msec MR Peak grad: 92.2 mmHg              SHUNTS MR Vmax:      480.00 cm/s            Systemic VTI:  0.20 m MV E velocity: 96.10 cm/s  103 cm/s  Systemic  Diam: 2.40 cm MV A velocity: 115.00 cm/s 70.3 cm/s MV E/A ratio:  0.84        1.5  Dorris Carnes MD Electronically signed by Dorris Carnes MD Signature Date/Time: 12/10/2019/11:36:54 AM    Final     Scheduled Meds: . aspirin  81 mg Oral Pre-Cath  . [MAR Hold] azithromycin  500 mg Oral Daily  . [MAR Hold] bisacodyl  5 mg Oral Once per day on Mon Thu  . [MAR Hold] busPIRone  10 mg Oral BID  . [MAR Hold] clopidogrel  75 mg Oral Daily  . [MAR Hold] doxazosin  1 mg Oral Daily  . [MAR Hold] dutasteride  0.5 mg Oral Q1400  . [MAR Hold] enoxaparin (LOVENOX) injection  40 mg Subcutaneous Q24H  . [MAR Hold] escitalopram  20 mg Oral q morning - 10a  . [MAR Hold] gabapentin  300 mg Oral BID  . [MAR Hold] ipratropium-albuterol  3 mL Nebulization TID  . [MAR Hold] isosorbide mononitrate  30 mg Oral q morning - 10a  . [MAR Hold] levothyroxine  50 mcg Oral q morning - 10a  . [MAR Hold] memantine  10 mg Oral BID  . [MAR Hold] mometasone-formoterol  1 puff Inhalation BID  . [MAR Hold] pantoprazole  40 mg Oral q morning - 10a  . [MAR Hold] pilocarpine  5 mg Oral BID  . [MAR Hold] potassium chloride  40 mEq Oral Once  . [MAR Hold] pravastatin  40 mg Oral QHS  . [MAR Hold] predniSONE  40 mg Oral Q breakfast  . [MAR Hold] QUEtiapine  25 mg Oral QHS  . [MAR Hold] sodium chloride flush  3 mL Intravenous Q12H  . [MAR Hold] sodium chloride flush  3 mL Intravenous Q12H  . [MAR Hold]  tamsulosin  0.4 mg Oral Daily    Continuous Infusions: . [MAR Hold] sodium chloride    . sodium chloride       LOS: 0 days   Time spent: 21 minutes  Dustin Wildeman Marry Guan, MD [name] Pager [phone]  If 7PM-7AM, please contact night-coverage [email or web address] Password [email or web address] 12/10/2019, 2:53 PM

## 2019-12-10 NOTE — ED Notes (Signed)
Breakfast Ordered 

## 2019-12-10 NOTE — Evaluation (Signed)
Physical Therapy Evaluation Patient Details Name: Dustin [name] MRN: [id number] DOB: 01-20-1934 Today's Date: 12/10/2019   History of Present Illness  IAAN KRABILL is a 85 y.o. male with medical history significant of COPD, hypertension, diastolic CHF, BPH, left bundle branch block, Alzheimer's, obstructive sleep apnea, coronary artery disease, admitted with chest pain.  Clinical Impression  Patient presents with mobility likely close to his baseline, but has history of peripheral neuropathy and demonstrates some gait limitations with imbalance as a result.  Educated on safety with using shoes, cane and lighting.  Also feel continued skilled PT in the acute setting indicated, but likely no follow up PT needs.  Noted SpO2 on RA throughout ambulation 92% or greater with no c/o chest pain.     Follow Up Recommendations No PT follow up    Equipment Recommendations  None recommended by PT    Recommendations for Other Services       Precautions / Restrictions Precautions Precautions: Fall      Mobility  Bed Mobility Overal bed mobility: Independent                Transfers Overall transfer level: Independent                  Ambulation/Gait Ambulation/Gait assistance: Supervision;Min guard Gait Distance (Feet): 400 Feet Assistive device: None Gait Pattern/deviations: Step-through pattern;Decreased stride length;Wide base of support;Ataxic     General Gait Details: initially more unsteady with close S to minguard A for balance, then able to walk no support and no LOB, just veering at times  Stairs            Wheelchair Mobility    Modified Rankin (Stroke Patients Only)       Balance Overall balance assessment: Needs assistance   Sitting balance-Leahy Scale: Normal Sitting balance - Comments: seated EOB to don shoes   Standing balance support: No upper extremity supported Standing balance-Leahy Scale: Good Standing balance comment: statoc  balance without UE support, no LOB, with ambulation initially needing minguard to S.                             Pertinent Vitals/Pain Pain Assessment: Faces Faces Pain Scale: Hurts little more Pain Location: feet due to neuropathy Pain Descriptors / Indicators: Burning;Aching Pain Intervention(s): Monitored during session;Repositioned    Home Living Family/patient expects to be discharged to:: Private residence Living Arrangements: Spouse/significant other Available Help at Discharge: Family Type of Home: House Home Access: Level entry     Home Layout: One level Home Equipment: Environmental consultant - 2 wheels;Cane - single point      Prior Function Level of Independence: Independent         Comments: wife helps with IADL's due to dementia     Hand Dominance        Extremity/Trunk Assessment   Upper Extremity Assessment Upper Extremity Assessment: Overall WFL for tasks assessed    Lower Extremity Assessment Lower Extremity Assessment: Overall WFL for tasks assessed(except neuropathy)    Cervical / Trunk Assessment Cervical / Trunk Assessment: Kyphotic  Communication   Communication: No difficulties  Cognition Arousal/Alertness: Awake/alert Behavior During Therapy: WFL for tasks assessed/performed Overall Cognitive Status: History of cognitive impairments - at baseline(admits to dementia, forgetful)  General Comments General comments (skin integrity, edema, etc.): Educated in importance of footwear, lighting for balance and using cane when more unsteady or if on unlevel surfaces  SpO2 throughout ambulation 92% or greater on RA, no c/o chest pain    Exercises     Assessment/Plan    PT Assessment Patient needs continued PT services  PT Problem List Decreased balance;Decreased knowledge of precautions;Decreased mobility;Decreased safety awareness       PT Treatment Interventions DME  instruction;Functional mobility training;Balance training;Patient/family education;Gait training;Stair training;Therapeutic exercise    PT Goals (Current goals can be found in the Care Plan section)  Acute Rehab PT Goals Patient Stated Goal: to go home PT Goal Formulation: With patient Time For Goal Achievement: 12/17/19 Potential to Achieve Goals: Good    Frequency Min 3X/week   Barriers to discharge        Co-evaluation               AM-PAC PT "6 Clicks" Mobility  Outcome Measure Help needed turning from your back to your side while in a flat bed without using bedrails?: None Help needed moving from lying on your back to sitting on the side of a flat bed without using bedrails?: None Help needed moving to and from a bed to a chair (including a wheelchair)?: None Help needed standing up from a chair using your arms (e.g., wheelchair or bedside chair)?: None Help needed to walk in hospital room?: A Little Help needed climbing 3-5 steps with a railing? : A Little 6 Click Score: 22    End of Session   Activity Tolerance: Patient tolerated treatment well Patient left: in bed Nurse Communication: Mobility status PT Visit Diagnosis: Other abnormalities of gait and mobility (R26.89)    Time: [id number] PT Time Calculation (min) (ACUTE ONLY): 30 min   Charges:   PT Evaluation $PT Eval Moderate Complexity: 1 Mod PT Treatments $Gait Training: 8-22 mins        Magda Kiel, [place] [phone] 12/10/2019   Reginia Naas 12/10/2019, 11:23 AM

## 2019-12-10 NOTE — ED Provider Notes (Signed)
[facility] EMERGENCY DEPARTMENT Provider Note   CSN: [id number] Arrival date & time: 12/09/19  1856     History Chief Complaint  Patient presents with  . Chest Pain    MESHILEM SHURN is a 85 y.o. male history of Alzheimer's dementia, CAD with stent placement, CABG, DDD, hypertension, hypothyroidism, left bundle branch block, MI, hyperlipidemia, OSA, COPD.  Per patient: Intermittent chest pain beginning last night a crushing left-sided chest pain radiating down the left arm severe feels like previous MI, each time it occurs he takes a nitro and it resolves shortly after.  Patient reports that with each episode of chest pain he becomes diaphoretic and feels chest pain has been worsening with each occurrence throughout the day.  Reports he has taken multiple nitro in the last day but does not remember the number exactly.  He reports that his wife helps take care of him and that she should be contacted for further information.  Per patient's wife Everlene Farrier: Patient left [facility] hospital yesterday after being admitted for difficulty breathing.  She reports that he may have left AGAINST MEDICAL ADVICE because he wanted to return home but she is not quite sure.  She reports that he returned home at 7 PM last night and shortly after began complaining of chest pain.  She reports he has been complaining of severe chest pain intermittently since returning home and has taken a total of 8 nitro.  She was able to convince him to come to the ER for evaluation due to his chest pain continually worsening in severity throughout the day.  No history of fever/chills, change to cough, nausea/vomiting, abdominal pain, extremity swelling/color change, numbness/tingling, weakness, hemoptysis, fall/injury, headache, neck pain or any additional concerns. - Per chart review patient was admitted to [facility] on 12/07/2019 for COPD exacerbation.  It appears patient had 2 L nasal cannula due  to hypoxia of 88% on room air.  He had reassuring troponins at that time, TSH was noted to be elevated and creatinine was mildly elevated as well.  Point-of-care Covid test was negative.  Patient was given Solu-Medrol and albuterol admitted to hospitalist service.  It appears patient was started on azithromycin and given duo nebs.  Patient is followed by Dr. Domenic Polite for CAD, on Plavix and statins.  The H&P note is complete however discharge summary is blank at this time unsure if patient was discharged or left AMA.   HPI     Past Medical History:  Diagnosis Date  . Alzheimer disease ([place])   . Anxiety   . Arthritis   . Atrophic gastritis    a. By EGD 02/2013.  . Carotid artery disease ([place])    a. mild-mod plaque, <50% stenosis bilat by duplex 2018.  [name] Coronary atherosclerosis of native coronary artery    a. Multivessel s/p CABG 1996. b. s/p DES x 2 SVG to PDA 8/12 with distal disease managed medically.  . DDD (degenerative disc disease)    Chronic back pain  . Enlarged prostate   . Essential hypertension, benign   . Hematuria   . Hypothyroidism   . LBBB (left bundle branch block)   . MI (myocardial infarction) ([place])   . Mixed hyperlipidemia   . OA (osteoarthritis)   . OSA (obstructive sleep apnea)   . Sinus bradycardia    a. Aricept and BB discontinued due to this.    Patient Active Problem List   Diagnosis Date Noted  . COPD (chronic obstructive  pulmonary disease) ([place]) 12/10/2019  . COPD exacerbation ([place]) 12/07/2019  . Unstable angina ([place]) 11/07/2018  . Fall 11/07/2018  . Gait instability 11/07/2018  . Leukocytosis 11/07/2018  . Depression 11/07/2018  . BPH (benign prostatic hyperplasia) 11/07/2018  . Chronic pain 11/07/2018  . Pressure injury of skin 02/15/2017  . Chronic diastolic CHF (congestive heart failure) ([place]) 12/24/2016  . Sinus bradycardia 12/23/2016  . Foot pain, bilateral 12/23/2016  . Hypokalemia 04/05/2016  . Acute respiratory failure ([place])  04/03/2016  . Acute encephalopathy 04/03/2016  . Hypothyroidism 04/03/2016  . Aspiration pneumonia ([place]) 04/03/2016  . Dementia ([place]) 04/03/2016  . Acute respiratory failure with hypoxia ([place]) 04/03/2016  . Memory loss 04/20/2015  . Hypoxia 01/27/2015  . CAP (community acquired pneumonia) 01/27/2015  . Fever 08/30/2014  . Healthcare associated bacterial pneumonia 08/30/2014  . Toxic Metabolic encephalopathy [id number]  . Sepsis ([place]) 08/30/2014  . Neck pain on right side 08/29/2014  . Neck pain 08/29/2014  . Left-sided weakness 06/28/2014  . Chest pain 06/27/2014  . Tubular adenoma of colon 03/05/2013  . Anorexia 03/05/2013  . Loss of weight 03/05/2013  . Chronic constipation 07/04/2012  . Bronchitis 08/16/2011  . Hyperlipidemia 10/01/2009  . OSA (obstructive sleep apnea) 10/01/2009  . Alzheimer disease ([place]) 10/01/2009  . HYPERTENSION, BENIGN 10/01/2009  . Coronary artery disease 10/01/2009    Past Surgical History:  Procedure Laterality Date  . COLONOSCOPY  08/03/2004   Jenkins-numerous large diverticula in the descending, transverse, descending, and sigmoid colon. Otherwise normal exam.  . COLONOSCOPY  07/12/2012   RMR: External hemorrhoidal tag; multiple rectal and colonic polyps removed and/or treated as described above. Pancolonic diverticulosis. Bx-tubular adenomas, rectal hyperplastic polyp. next colonoscopy in 06/2015.  [name] COLONOSCOPY N/A 06/22/2016   Procedure: COLONOSCOPY;  Surgeon: Aviva Signs, MD;  Location: [facility] ENDO SU[facility];  Service: Gastroenterology;  Laterality: N/A;  730  . CORONARY ANGIOPLASTY WITH STENT PLACEMENT    . CORONARY ARTERY BYPASS GRAFT  1996   LIMA to LAD, SVG to D2, SVG to PDA, SVG to OM1 and OM2  . ESOPHAGOGASTRODUODENOSCOPY N/A 03/16/2013   Procedure: ESOPHAGOGASTRODUODENOSCOPY (EGD);  Surgeon: Daneil Dolin, MD;  Location: [facility] ENDO SU[facility];  Service: Endoscopy;  Laterality: N/A;  12:00-moved to [place] notified pt  . ESOPHAGOGASTRODUODENOSCOPY  N/A 03/06/2013   Procedure: ESOPHAGOGASTRODUODENOSCOPY (EGD);  Surgeon: Daneil Dolin, MD;  Location: [facility] END[facility];  Service: Endoscopy;  Laterality: N/A;  . HERNIA REPAIR         Family History  Problem Relation Age of Onset  . Heart disease Other   . Heart attack Mother   . Colon cancer Neg Hx     Social History   Tobacco Use  . Smoking status: Former Smoker    Packs/day: 2.00    Years: 40.00    Pack years: 80.00    Types: Cigarettes    Quit date: 12/27/1994    Years since quitting: 24.9  . Smokeless tobacco: Never Used  Substance Use Topics  . Alcohol use: No    Alcohol/week: 0.0 standard drinks  . Drug use: No    Home Medications Prior to Admission medications   Medication Sig Start Date End Date Taking? Authorizing Provider  albuterol (PROVENTIL) (2.5 MG/3ML) 0.083% nebulizer solution Take 3 mLs (2.5 mg total) by nebulization every 6 (six) hours as needed for wheezing or shortness of breath. Patient will also need the nebulizer machine with this prescription. 10/22/18  Yes Nat Christen, MD  albuterol (VENTOLIN HFA) 108 (  90 Base) MCG/ACT inhaler Inhale 2 puffs into the lungs every 6 (six) hours as needed for wheezing or shortness of breath.   Yes [provider]  bisacodyl (DULCOLAX) 5 MG EC tablet Take 5 mg by mouth 2 (two) times a week.   Yes [provider]  busPIRone (BUSPAR) 10 MG tablet Take 10 mg by mouth 2 (two) times daily.  11/03/18  Yes [provider]  clopidogrel (PLAVIX) 75 MG tablet Take 1 tablet (75 mg total) by mouth daily. 08/14/19  Yes Satira Sark, MD  azithromycin (ZITHROMAX) 500 MG tablet Take 1 tablet (500 mg total) by mouth daily for 4 days. This is to treat for your COPD flare up. 12/08/19 12/12/19  Enzo Bi, MD  DOK 100 MG capsule Take 100 mg by mouth daily. 11/19/19   [provider]  doxazosin (CARDURA) 1 MG tablet Take 1 mg by mouth daily. 11/19/19   [provider]  dutasteride (AVODART) 0.5 MG  capsule Take 0.5 mg by mouth. In the afternoon    [provider]  escitalopram (LEXAPRO) 20 MG tablet Take 20 mg by mouth every morning.     [provider]  furosemide (LASIX) 20 MG tablet Take 20 mg by mouth 2 (two) times daily.  12/03/16   [provider]  gabapentin (NEURONTIN) 300 MG capsule Take 300 mg by mouth 2 (two) times daily.    [provider]  hydrOXYzine (ATARAX/VISTARIL) 25 MG tablet Take 25 mg by mouth 4 (four) times daily as needed for anxiety.  11/19/19   [provider]  isosorbide mononitrate (IMDUR) 30 MG 24 hr tablet Take 1 tablet (30 mg total) by mouth every morning. 12/08/19   Enzo Bi, MD  levothyroxine (SYNTHROID, LEVOTHROID) 50 MCG tablet Take 50 mcg by mouth every morning.  10/04/16   [provider]  memantine (NAMENDA) 10 MG tablet Take 10 mg by mouth 2 (two) times daily.  03/05/16   [provider]  nitroGLYCERIN (NITROSTAT) 0.4 MG SL tablet Place 1 tablet (0.4 mg total) under the tongue every 5 (five) minutes as needed for chest pain. PLACE ONE TABLET UNDER TONGUE EVERY 5 MIN UP TO 3 DOSES AS NEEDED FORCHEST PAIN. 12/08/19   Enzo Bi, MD  oxycodone (ROXICODONE) 30 MG immediate release tablet Take 30 mg by mouth every 4 (four) hours as needed for pain. Take 1 tablet by mouth every 4 to 6 hours as needed for pain.  Max 8 /24 hours. 12/09/16   [provider]  pantoprazole (PROTONIX) 40 MG tablet Take 1 tablet (40 mg total) by mouth every morning. 12/08/19   Enzo Bi, MD  pilocarpine (SALAGEN) 5 MG tablet Take 5 mg by mouth 2 (two) times daily.    [provider]  potassium chloride SA (K-DUR,KLOR-CON) 20 MEQ tablet Take 20 mEq by mouth daily.     [provider]  pravastatin (PRAVACHOL) 40 MG tablet Take 1 tablet (40 mg total) by mouth at bedtime. 12/08/19   Enzo Bi, MD  predniSONE (DELTASONE) 20 MG tablet Take 2 tablets (40 mg total) by mouth daily with breakfast for 3 days. This  is for your COPD flare up 12/09/19 12/12/19  Enzo Bi, MD  QUEtiapine (SEROQUEL) 25 MG tablet Take 25 mg by mouth at bedtime. 11/19/19   [provider]  tamsulosin (FLOMAX) 0.4 MG CAPS capsule Take 1 capsule (0.4 mg total) by mouth daily. To help you pee. 12/08/19   Enzo Bi, MD  Allergies    Patient has no known allergies.  Review of Systems   Review of Systems Ten systems are reviewed and are negative for acute change except as noted in the HPI  Physical Exam Updated Vital Signs BP (!) 156/65   Pulse (!) 52   Temp 98.1 F (36.7 C) (Oral)   Resp 14   Ht 5\' 9"  (1.753 m)   Wt 95.3 kg   SpO2 (!) 89%   BMI 31.01 kg/m   Physical Exam Constitutional:      General: He is not in acute distress.    Appearance: Normal appearance. He is well-developed. He is not ill-appearing or diaphoretic.  HENT:     Head: Normocephalic and atraumatic.     Right Ear: External ear normal.     Left Ear: External ear normal.     Nose: Nose normal.  Eyes:     General: Vision grossly intact. Gaze aligned appropriately.     Pupils: Pupils are equal, round, and reactive to light.  Neck:     Trachea: Trachea and phonation normal. No tracheal deviation.  Cardiovascular:     Rate and Rhythm: Normal rate and regular rhythm.     Pulses:          Dorsalis pedis pulses are 2+ on the right side and 2+ on the left side.     Heart sounds: Normal heart sounds.  Pulmonary:     Effort: Pulmonary effort is normal. No respiratory distress.     Breath sounds: Decreased breath sounds present.  Chest:     Chest wall: No deformity, tenderness or crepitus.  Abdominal:     General: There is no distension.     Palpations: Abdomen is soft.     Tenderness: There is no abdominal tenderness. There is no guarding or rebound.  Musculoskeletal:        General: Normal range of motion.     Cervical back: Normal range of motion.     Right lower leg: No tenderness. No edema.     Left lower leg: No tenderness.  No edema.  Skin:    General: Skin is warm and dry.  Neurological:     Mental Status: He is alert.     GCS: GCS eye subscore is 4. GCS verbal subscore is 5. GCS motor subscore is 6.     Comments: Speech is clear and goal oriented, follows commands Major Cranial nerves without deficit, no facial droop Moves extremities without ataxia, coordination intact  Psychiatric:        Behavior: Behavior normal.     ED Results / Procedures / Treatments   Labs (all labs ordered are listed, but only abnormal results are displayed) Labs Reviewed  BASIC METABOLIC PANEL - Abnormal; Notable for the following components:      Result Value   Glucose, Bld 127 (*)    All other components within normal limits  CBC - Abnormal; Notable for the following components:   WBC 13.7 (*)    RBC 3.96 (*)    Hemoglobin 12.0 (*)    HCT 37.9 (*)    All other components within normal limits  SARS CORONAVIRUS 2 (TAT 6-24 HRS)  D-DIMER, QUANTITATIVE (NOT AT [facility])  TROPONIN I (HIGH SENSITIVITY)  TROPONIN I (HIGH SENSITIVITY)  TROPONIN I (HIGH SENSITIVITY)    EKG EKG Interpretation  Date/Time:  Sunday December 09 2019 22:32:49 EST Ventricular Rate:  59 PR Interval:    QRS Duration: 137 QT Interval:  466  QTC Calculation: 462 R Axis:   38 Text Interpretation: Sinus arrhythmia Left bundle branch block Since last tracing rate slower Otherwise no significant change Confirmed by Deno Etienne [phone]) on 12/09/2019 11:47:13 PM   Radiology DG Chest 2 View  Result Date: 12/09/2019 CLINICAL DATA:  Chest pain for 2-3 months EXAM: CHEST - 2 VIEW COMPARISON:  12/07/2019 FINDINGS: Cardiac shadow is again enlarged but stable. Postsurgical changes are noted. Aortic calcifications are seen. The lungs are mildly hyperinflated with bibasilar atelectatic changes. No sizable effusion is noted. No focal infiltrate is seen. IMPRESSION: Mild bibasilar atelectasis new from the prior exam. Electronically Signed   By: Inez Catalina M.D.    On: 12/09/2019 20:16    Procedures Procedures (including critical care time)  Medications Ordered in ED Medications  sodium chloride flush (NS) 0.9 % injection 3 mL (3 mLs Intravenous Given 12/09/19 2216)    ED Course  I have reviewed the triage vital signs and the nursing notes.  Pertinent labs & imaging results that were available during my care of the patient were reviewed by me and considered in my medical decision making (see chart for details).  Clinical Course as of Dec 10 7  Sun Dec 09, 2019  2232 [facility]   [BM]  2345 Dr. Roel Cluck   [BM]    Clinical Course User Index [BM] Gari Crown   MDM Rules/Calculators/A&P  EKG: Sinus arrhythmia Left bundle branch block Since last tracing rate slower Otherwise no significant change Confirmed by Deno Etienne [phone]) on 12/09/2019 11:47:13 PM  CXR:  IMPRESSION: Mild bibasilar atelectasis new from the prior exam.  CBC with leukocytosis of 13.7, suspect leukocytosis may be secondary to recent steroid use on last hospitalization, he is without infectious-like symptoms. BMP nonacute High-sensitivity troponin: 17 - Reassuring EKG and troponin at this time but is chest pain sounds typical and has been progressively worsening throughout the day.  Discussed case with Dr. Tamera Punt, feel this time patient would benefit from admission, plan to consult hospitalist at this time.  As patient without tachycardia or hypoxia on room air doubt PE as etiology of symptoms today, additionally equal distal pulses, and chest x-ray without widening, patient comfortable appearing low suspicion for dissection. - Patient reassessed resting comfortably no acute distress is agreeable for admission.  Vital signs stable.  Discussed case with Dr. Roel Cluck will be seeing patient for admission.  Note: Portions of this report may have been transcribed using voice recognition software. Every effort was made to ensure accuracy; however, inadvertent  computerized transcription errors may still be present. Final Clinical Impression(s) / ED Diagnoses Final diagnoses:  Chest pain, unspecified type    Rx / DC Orders ED Discharge Orders    None       Gari Crown 12/10/19 0018    Malvin Johns, MD 12/11/19 2024

## 2019-12-11 DIAGNOSIS — Z13220 Encounter for screening for lipoid disorders: Secondary | ICD-10-CM | POA: Diagnosis not present

## 2019-12-11 DIAGNOSIS — I10 Essential (primary) hypertension: Secondary | ICD-10-CM

## 2019-12-11 LAB — BASIC METABOLIC PANEL
Anion gap: 9 (ref 5–15)
BUN: 14 mg/dL (ref 8–23)
CO2: 28 mmol/L (ref 22–32)
Calcium: 8.6 mg/dL — ABNORMAL LOW (ref 8.9–10.3)
Chloride: 105 mmol/L (ref 98–111)
Creatinine, Ser: 0.96 mg/dL (ref 0.61–1.24)
GFR calc Af Amer: >60 mL/min (ref >60)
GFR calc non Af Amer: >60 mL/min (ref >60)
Glucose, Bld: 124 mg/dL — ABNORMAL HIGH (ref 70–99)
Potassium: 3.7 mmol/L (ref 3.5–5.1)
Sodium: 142 mmol/L (ref 135–145)

## 2019-12-11 LAB — CBC
HCT: 35.8 % — ABNORMAL LOW (ref 39.0–52.0)
Hemoglobin: 12 g/dL — ABNORMAL LOW (ref 13.0–17.0)
MCH: 30.8 pg (ref 26.0–34.0)
MCHC: 33.5 g/dL (ref 30.0–36.0)
MCV: 92 fL (ref 80.0–100.0)
Platelets: 235 10*3/uL (ref 150–400)
RBC: 3.89 MIL/uL — ABNORMAL LOW (ref 4.22–5.81)
RDW: 12.6 % (ref 11.5–15.5)
WBC: 7.9 10*3/uL (ref 4.0–10.5)
nRBC: 0 % (ref 0.0–0.2)

## 2019-12-11 LAB — LIPID PANEL
Cholesterol: 143 mg/dL (ref 0–200)
HDL: 37 mg/dL — ABNORMAL LOW (ref >40)
LDL Cholesterol: 77 mg/dL (ref 0–99)
Total CHOL/HDL Ratio: 3.9 RATIO
Triglycerides: 147 mg/dL (ref <150)
VLDL: 29 mg/dL (ref 0–40)

## 2019-12-11 MED ORDER — ISOSORBIDE MONONITRATE ER 60 MG PO TB24
60.0000 mg | ORAL_TABLET | Freq: Every morning | ORAL | Status: DC
  Administered 2019-12-11: 09:00:00 60 mg via ORAL
  Filled 2019-12-11: qty 1

## 2019-12-11 MED ORDER — ISOSORBIDE MONONITRATE ER 60 MG PO TB24: 60.0000 mg | ORAL_TABLET | Freq: Every morning | ORAL | 0 refills | Status: DC

## 2019-12-11 MED ORDER — ROSUVASTATIN CALCIUM 40 MG PO TABS: 40.0000 mg | ORAL_TABLET | Freq: Every day | ORAL | 0 refills | Status: DC

## 2019-12-11 MED ORDER — ASPIRIN EC 81 MG PO TBEC
81.0000 mg | DELAYED_RELEASE_TABLET | Freq: Every day | ORAL | Status: DC
  Administered 2019-12-11: 81 mg via ORAL
  Filled 2019-12-11: qty 1

## 2019-12-11 MED ORDER — ROSUVASTATIN CALCIUM 20 MG PO TABS: 40.0000 mg | ORAL_TABLET | Freq: Every day | ORAL | Status: DC

## 2019-12-11 MED ORDER — ASPIRIN 81 MG PO TBEC: 81.0000 mg | DELAYED_RELEASE_TABLET | Freq: Every day | ORAL | 0 refills | Status: DC

## 2019-12-11 NOTE — Progress Notes (Signed)
CARDIAC REHAB PHASE I   PRE:  Rate/Rhythm: 69 SR  BP:  Supine:   Sitting: 170/66  Standing:    SaO2: 96%RA  MODE:  Ambulation: 470 ft   POST:  Rate/Rhythm: 85 SR  BP:  Supine:   Sitting: 162/76  Standing:    SaO2: 93%RA 0805-0905 Pt walked 470 ft on RA with hand held asst. I put pt's shoes on to help with stability. No CP and he stated his breathing was better than before admission. Monitored sats whole walk and around 92%. Discussed CRP 2 with pt which he stated he attended after CABG. Referred to [place] program. Gave pt heart healthy diet and encouraged him to have wife read. Pt stated he eats most meals out. Pt also encouraged to take his meds and discussed importance of plavix with stent. Pt stated he gets his meds prepackaged from the pharmacy. Gave pt walking instructions for ex which are written.  Pt voiced understanding at this time.    Graylon Good, RN BSN  12/11/2019 8:59 AM

## 2019-12-11 NOTE — Progress Notes (Addendum)
The patient has been seen in conjunction with Reino Bellis, NP. All aspects of care have been considered and discussed. The patient has been personally interviewed, examined, and all clinical data has been reviewed.   Says he feels better today.  He has ambulated once.  Does not feel short of breath.  The sharp atypical chest pain has not recurred.  I am still not certain that the sharp pain was cardiac in etiology.  Cardiac markers remained flat.  Assume he was having progressive angina without myocardial injury.  Will need 1 year of aspirin/clopidogrel DAPT therapy given location of stent  Discharged today with follow-up via Dr. Rozann Lesches.   Progress Note  Patient Name: ZAVON GAUSMAN Date of Encounter: 12/11/2019  Primary Cardiologist: Rozann Lesches, MD   Subjective   Feels significantly better this morning. No further chest pain and breathing is much improved.   Inpatient Medications    Scheduled Meds: . azithromycin  500 mg Oral Daily  . bisacodyl  5 mg Oral Once per day on Mon Thu  . busPIRone  10 mg Oral BID  . clopidogrel  75 mg Oral Daily  . doxazosin  1 mg Oral Daily  . dutasteride  0.5 mg Oral Q1400  . escitalopram  20 mg Oral q morning - 10a  . gabapentin  300 mg Oral BID  . ipratropium-albuterol  3 mL Nebulization TID  . isosorbide mononitrate  30 mg Oral q morning - 10a  . levothyroxine  50 mcg Oral Q0600  . memantine  10 mg Oral BID  . mometasone-formoterol  1 puff Inhalation BID  . pantoprazole  40 mg Oral q morning - 10a  . pilocarpine  5 mg Oral BID  . potassium chloride  40 mEq Oral Once  . pravastatin  40 mg Oral QHS  . predniSONE  40 mg Oral Q breakfast  . QUEtiapine  25 mg Oral QHS  . sodium chloride flush  3 mL Intravenous Q12H  . tamsulosin  0.4 mg Oral Daily   Continuous Infusions: . sodium chloride     PRN Meds: sodium chloride, acetaminophen **OR** acetaminophen, albuterol, HYDROcodone-acetaminophen, ondansetron **OR**  ondansetron (ZOFRAN) IV, oxycodone, sodium chloride flush   Vital Signs    Vitals:   12/11/19 0116 12/11/19 0216 12/11/19 0524 12/11/19 0740  BP: (!) 151/64 (!) 153/67  (!) 170/66  Pulse: (!) 58 (!) 56  (!) 54  Resp:    18  Temp:   97.7 F (36.5 C) 98.1 F (36.7 C)  TempSrc:   Oral Oral  SpO2: 90% 91%  95%  Weight:   92.8 kg   Height:        Intake/Output Summary (Last 24 hours) at 12/11/2019 0753 Last data filed at 12/11/2019 0630 Gross per 24 hour  Intake 360 ml  Output 125 ml  Net 235 ml   Last 3 Weights 12/11/2019 12/09/2019 12/07/2019  Weight (lbs) 204 lb 8 oz 210 lb 212 lb 4.9 oz  Weight (kg) 92.761 kg 95.255 kg 96.3 kg      Telemetry    SB - Personally Reviewed  ECG    SB with TWI in inferolateral leads - Personally Reviewed  Physical Exam  Pleasant older WM, sitting up in chair. GEN: No acute distress.   Neck: No JVD Cardiac: RRR, no murmurs, rubs, or gallops.  Respiratory: Clear to auscultation bilaterally. GI: Soft, nontender, non-distended  MS: No edema; No deformity. Left radial cath site stable. Neuro:  Nonfocal  Psych: Normal affect   Labs    High Sensitivity Troponin:   Recent Labs  Lab 12/07/19 1814 12/09/19 1917 12/09/19 2212 12/10/19 0004 12/10/19 0357  TROPONINIHS 8 17 14 11  18*      Chemistry Recent Labs  Lab 12/07/19 1557 12/09/19 1917 12/10/19 0357 12/11/19 0438  NA 137 140 141 142  K 3.8 3.7 3.4* 3.7  CL 98 102 101 105  CO2 29 30 28 28   GLUCOSE 120* 127* 112* 124*  BUN 23 18 15 14   CREATININE 1.46* 0.98 0.94 0.96  CALCIUM 8.8* 9.1 9.1 8.6*  PROT 7.1  --  6.5  --   ALBUMIN 4.1  --  3.8  --   AST 28  --  26  --   ALT 21  --  23  --   ALKPHOS 57  --  49  --   BILITOT 1.0  --  1.1  --   GFRNONAA 44* >60 >60 >60  GFRAA 51* >60 >60 >60  ANIONGAP 10 8 12 9      Hematology Recent Labs  Lab 12/09/19 1917 12/10/19 0357 12/11/19 0438  WBC 13.7* 10.0 7.9  RBC 3.96* 4.28 3.89*  HGB 12.0* 13.2 12.0*  HCT 37.9*  40.6 35.8*  MCV 95.7 94.9 92.0  MCH 30.3 30.8 30.8  MCHC 31.7 32.5 33.5  RDW 12.9 13.0 12.6  PLT 245 252 235    BNP Recent Labs  Lab 12/07/19 1557  BNP 41.0     DDimer  Recent Labs  Lab 12/10/19 0004  DDIMER 0.39     Radiology    DG Chest 2 View  Result Date: 12/09/2019 CLINICAL DATA:  Chest pain for 2-3 months EXAM: CHEST - 2 VIEW COMPARISON:  12/07/2019 FINDINGS: Cardiac shadow is again enlarged but stable. Postsurgical changes are noted. Aortic calcifications are seen. The lungs are mildly hyperinflated with bibasilar atelectatic changes. No sizable effusion is noted. No focal infiltrate is seen. IMPRESSION: Mild bibasilar atelectasis new from the prior exam. Electronically Signed   By: Inez Catalina M.D.   On: 12/09/2019 20:16   DG Abd 1 View  Result Date: 12/10/2019 CLINICAL DATA:  Constipation. EXAM: ABDOMEN - 1 VIEW COMPARISON:  January 22, 2013 FINDINGS: The bowel gas pattern is normal. No radio-opaque calculi or other significant radiographic abnormality are seen. IMPRESSION: Negative. Electronically Signed   By: Constance Holster M.D.   On: 12/10/2019 00:53   CARDIAC CATHETERIZATION  Result Date: 12/10/2019  Origin lesion is 100% stenosed.  Mid Graft to Dist Graft lesion is 100% stenosed.  Mid RCA lesion is 100% stenosed.  Prox Graft lesion is 100% stenosed.  Mid LAD lesion is 99% stenosed.  Prox Cx to Mid Cx lesion is 100% stenosed.  SVG graft was visualized by angiography.  SVG graft was visualized by angiography.  Origin to Prox Graft lesion is 100% stenosed.  SVG graft was visualized by angiography.  LIMA graft was visualized by angiography.  Ost LM to Mid LM lesion is 90% stenosed.  A drug-eluting stent was successfully placed using a STENT RESOLUTE ONYX 4.5X18.  Post intervention, there is a 0% residual stenosis.  1. Severe triple vessel CAD s/p 5V CABG with 2/5 patent bypass grafts. 2. The left main artery has a moderate ostial stenosis and a severe  mid stenosis. 3. The LAD has severe proximal to mid stenosis beyond the first large caliber diagonal branch. The LIMA to the LAD is patent. The vein graft to the second diagonal branch  is occluded. The second diagonal branch fills from flow through the LIMA to the LAD. The first diagonal branch is not protected by the LIMA graft. 4. The ramus intermediate branch is a moderate caliber vessel and is not protected by grafts. 5. The Circumflex is a moderate caliber vessel with chronic mid occlusion. The sequential vein graft to OM1 and OM2 is patent to OM1 but the sequential segment of the graft to OM2 is occluded. OM2 fills from left to left collaterals. 6. The RCA is a large caliber vessel with chronic mid occlusion. The vein graft to the PDA is now occluded. The distal RCA and it's branches fill from left to right collaterals supplied through the LIMA to the LAD. 7. Successful PTCA/DES x 1 ostial to to distal left main artery. Recommendations: Continue ASA and Plavix for lifetime given the left main stenting. Continue statin and Imdur. Anticipate discharge home tomorrow.   ECHOCARDIOGRAM COMPLETE  Result Date: 12/10/2019   ECHOCARDIOGRAM REPORT   Patient Name:   TREYVONN EDGELL Date of Exam: 12/10/2019 Medical Rec #:  [id number]       Height:       69.0 in Accession #:    [id number]      Weight:       210.0 lb Date of Birth:  Oct 12, 1934       BSA:          2.11 m Patient Age:    34 years        BP:           120/69 mmHg Patient Gender: M               HR:           54 bpm. Exam Location:  Inpatient Procedure: 2D Echo Indications:    Chest Pain R07.9  History:        Patient has prior history of Echocardiogram examinations, most                 recent 11/08/2018. CAD and Previous Myocardial Infarction, Prior                 CABG, Arrythmias:LBBB; Risk Factors:Hypertension and                 Dyslipidemia.  Sonographer:    Mikki Santee RDCS (AE) Referring Phys: [place]  1. Left  ventricular ejection fraction, by visual estimation, is 55 to 60%. The left ventricle has normal function. There is no left ventricular hypertrophy.  2. Left ventricular diastolic parameters are consistent with Grade I diastolic dysfunction (impaired relaxation).  3. Severely dilated left ventricular internal cavity size.  4. The left ventricle has no regional wall motion abnormalities.  5. No change in LVEF from echo done in 2019.  6. Global right ventricle has normal systolic function.The right ventricular size is normal. No increase in right ventricular wall thickness.  7. Left atrial size was mildly dilated.  8. Right atrial size was mildly dilated.  9. The mitral valve is normal in structure. Mild mitral valve regurgitation. 10. The tricuspid valve is grossly normal. Tricuspid valve regurgitation is trivial. 11. The aortic valve is tricuspid. Aortic valve regurgitation is not visualized. Mild aortic valve sclerosis without stenosis. 12. The pulmonic valve was grossly normal. Pulmonic valve regurgitation is trivial. 13. Normal pulmonary artery systolic pressure. 14. The interatrial septum was not assessed. FINDINGS  Left Ventricle: Left ventricular ejection fraction, by visual estimation, is 55 to  60%. The left ventricle has normal function. The left ventricle has no regional wall motion abnormalities. The left ventricular internal cavity size was severely dilated left ventricle. There is no left ventricular hypertrophy. Left ventricular diastolic parameters are consistent with Grade I diastolic dysfunction (impaired relaxation). No change in LVEF from echo done in 2019. Right Ventricle: The right ventricular size is normal. No increase in right ventricular wall thickness. Global RV systolic function is has normal systolic function. The tricuspid regurgitant velocity is 2.23 m/s, and with an assumed right atrial pressure  of 3 mmHg, the estimated right ventricular systolic pressure is normal at 22.9 mmHg. Left  Atrium: Left atrial size was mildly dilated. Right Atrium: Right atrial size was mildly dilated Pericardium: There is no evidence of pericardial effusion. Mitral Valve: The mitral valve is normal in structure. Mild mitral valve regurgitation. Tricuspid Valve: The tricuspid valve is grossly normal. Tricuspid valve regurgitation is trivial. Aortic Valve: The aortic valve is tricuspid. Aortic valve regurgitation is not visualized. Mild aortic valve sclerosis is present, with no evidence of aortic valve stenosis. Pulmonic Valve: The pulmonic valve was grossly normal. Pulmonic valve regurgitation is trivial. Pulmonic regurgitation is trivial. Aorta: The aortic root is normal in size and structure. Venous: The inferior vena cava was not well visualized. IAS/Shunts: The interatrial septum was not assessed.  LEFT VENTRICLE PLAX 2D LVIDd:         6.11 cm  Diastology LVIDs:         4.00 cm  LV e' lateral:   9.79 cm/s LV PW:         1.03 cm  LV E/e' lateral: 9.8 LV IVS:        1.04 cm  LV e' medial:    6.96 cm/s LVOT diam:     2.40 cm  LV E/e' medial:  13.8 LV SV:         118 ml LV SV Index:   53.93 LVOT Area:     4.52 cm  RIGHT VENTRICLE RV S prime:     9.36 cm/s TAPSE (M-mode): 1.4 cm LEFT ATRIUM             Index       RIGHT ATRIUM           Index LA diam:        4.60 cm 2.18 cm/m  RA Area:     23.90 cm LA Vol (A2C):   72.1 ml 34.18 ml/m RA Volume:   69.40 ml  32.90 ml/m LA Vol (A4C):   52.3 ml 24.80 ml/m LA Biplane Vol: 61.5 ml 29.16 ml/m  AORTIC VALVE LVOT Vmax:   88.60 cm/s LVOT Vmean:  53.800 cm/s LVOT VTI:    0.198 m  AORTA Ao Root diam: 3.30 cm MITRAL VALVE                         TRICUSPID VALVE MV Area (PHT): 3.53 cm              TR Peak grad:   19.9 mmHg MV PHT:        62.35 msec            TR Vmax:        223.00 cm/s MV Decel Time: 215 msec MR Peak grad: 92.2 mmHg              SHUNTS MR Vmax:      480.00 cm/s  Systemic VTI:  0.20 m MV E velocity: 96.10 cm/s  103 cm/s  Systemic Diam: 2.40 cm MV A  velocity: 115.00 cm/s 70.3 cm/s MV E/A ratio:  0.84        1.5  Dorris Carnes MD Electronically signed by Dorris Carnes MD Signature Date/Time: 12/10/2019/11:36:54 AM    Final     Cardiac Studies   Cath: 12/10/19   Origin lesion is 100% stenosed.  Mid Graft to Dist Graft lesion is 100% stenosed.  Mid RCA lesion is 100% stenosed.  Prox Graft lesion is 100% stenosed.  Mid LAD lesion is 99% stenosed.  Prox Cx to Mid Cx lesion is 100% stenosed.  SVG graft was visualized by angiography.  SVG graft was visualized by angiography.  Origin to Prox Graft lesion is 100% stenosed.  SVG graft was visualized by angiography.  LIMA graft was visualized by angiography.  Ost LM to Mid LM lesion is 90% stenosed.  A drug-eluting stent was successfully placed using a STENT RESOLUTE ONYX 4.5X18.  Post intervention, there is a 0% residual stenosis.   1. Severe triple vessel CAD s/p 5V CABG with 2/5 patent bypass grafts.  2. The left main artery has a moderate ostial stenosis and a severe mid stenosis.  3. The LAD has severe proximal to mid stenosis beyond the first large caliber diagonal branch. The LIMA to the LAD is patent. The vein graft to the second diagonal branch is occluded. The second diagonal branch fills from flow through the LIMA to the LAD. The first diagonal branch is not protected by the LIMA graft.  4. The ramus intermediate branch is a moderate caliber vessel and is not protected by grafts.  5. The Circumflex is a moderate caliber vessel with chronic mid occlusion. The sequential vein graft to OM1 and OM2 is patent to OM1 but the sequential segment of the graft to OM2 is occluded. OM2 fills from left to left collaterals.  6. The RCA is a large caliber vessel with chronic mid occlusion. The vein graft to the PDA is now occluded. The distal RCA and it's branches fill from left to right collaterals supplied through the LIMA to the LAD.  7. Successful PTCA/DES x 1 ostial to to distal left  main artery.   Recommendations: Continue ASA and Plavix for lifetime given the left main stenting. Continue statin and Imdur. Anticipate discharge home tomorrow.   Diagnostic Dominance: Right  Intervention    TTE: 12/10/19  IMPRESSIONS    1. Left ventricular ejection fraction, by visual estimation, is 55 to 60%. The left ventricle has normal function. There is no left ventricular hypertrophy.  2. Left ventricular diastolic parameters are consistent with Grade I diastolic dysfunction (impaired relaxation).  3. Severely dilated left ventricular internal cavity size.  4. The left ventricle has no regional wall motion abnormalities.  5. No change in LVEF from echo done in 2019.  6. Global right ventricle has normal systolic function.The right ventricular size is normal. No increase in right ventricular wall thickness.  7. Left atrial size was mildly dilated.  8. Right atrial size was mildly dilated.  9. The mitral valve is normal in structure. Mild mitral valve regurgitation. 10. The tricuspid valve is grossly normal. Tricuspid valve regurgitation is trivial. 11. The aortic valve is tricuspid. Aortic valve regurgitation is not visualized. Mild aortic valve sclerosis without stenosis. 12. The pulmonic valve was grossly normal. Pulmonic valve regurgitation is trivial. 13. Normal pulmonary artery systolic pressure. 14. The interatrial septum was  not assessed.  Patient Profile     85 y.o. male with a history of CAD s/p remote in 1996 with subsequent DES x2 to SVG to PDA in 07/2011 with distal disease managed medially, LBBB, mild bilateral carotid artery stenosis, hypertension, hyperlipidemia, obstructive sleep apnea, hypothyroidism, and Alzheimer disease who was seen for the evaluation of chest pain at the request of Dr. Roel Cluck.   Assessment & Plan    1. Unstable Angina with hx of CAD s/p remote stenting: underwent cardiac cath yesterday with 2/5 patent grafts. Patent LIMA-LAD,  SVG-OM1. Occluded SVG-2nd diag, sequential SVG-OM2, SVG-PDA. Successful PCI/DES x1 to ostial/d LM to which supplied 1st diag not protected by LIMA-LAD.  -- plan for lifetime DAPT with ASA/plavix given LM stenting.  -- plan to work with cardiac rehab today. Anticipate DC. Will arrange for follow up appt.   2. Hypertension: slightly elevated. Will continue with current therapy.  3. Hyperlipidemia: on pravastatin. LDL 77. Discussed with patient and he is agreeable to switch to Crestor.  4. Chronic diastolic HF: echo this admission shows EF of 55-60% with G1DD. No signs of volume overload on exam  5. COPD exacerbation: recently admitted to [facility] with same. Placed on azithromycin and steroid taper.   For questions or updates, please contact [place] Please consult [email or web address] for contact info under     Signed, Reino Bellis, NP  12/11/2019, 7:53 AM

## 2019-12-11 NOTE — Evaluation (Addendum)
Clinical/Bedside Swallow Evaluation Patient Details  Name: Dustin [name] MRN: [id number] Date of Birth: April 04, 1934  Today's Date: 12/11/2019 Time: SLP Start Time (ACUTE ONLY): [id number] SLP Stop Time (ACUTE ONLY): 0950 SLP Time Calculation (min) (ACUTE ONLY): 25 min  Past Medical History:  Past Medical History:  Diagnosis Date  . Alzheimer disease ([place])   . Anxiety   . Arthritis   . Atrophic gastritis    a. By EGD 02/2013.  . Carotid artery disease ([place])    a. mild-mod plaque, <50% stenosis bilat by duplex 2018.  [name] Coronary atherosclerosis of native coronary artery    a. Multivessel s/p CABG 1996. b. s/p DES x 2 SVG to PDA 8/12 with distal disease managed medically.  . DDD (degenerative disc disease)    Chronic back pain  . Enlarged prostate   . Essential hypertension, benign   . Hematuria   . Hypothyroidism   . LBBB (left bundle branch block)   . MI (myocardial infarction) ([place])   . Mixed hyperlipidemia   . OA (osteoarthritis)   . OSA (obstructive sleep apnea)   . Sinus bradycardia    a. Aricept and BB discontinued due to this.   Past Surgical History:  Past Surgical History:  Procedure Laterality Date  . COLONOSCOPY  08/03/2004   [place]-numerous large diverticula in the descending, transverse, descending, and sigmoid colon. Otherwise normal exam.  . COLONOSCOPY  07/12/2012   RMR: External hemorrhoidal tag; multiple rectal and colonic polyps removed and/or treated as described above. Pancolonic diverticulosis. Bx-tubular adenomas, rectal hyperplastic polyp. next colonoscopy in 06/2015.  [name] COLONOSCOPY N/A 06/22/2016   Procedure: COLONOSCOPY;  Surgeon: Aviva Signs, MD;  Location: [facility];  Service: Gastroenterology;  Laterality: N/A;  730  . CORONARY ANGIOPLASTY WITH STENT PLACEMENT    . CORONARY ARTERY BYPASS GRAFT  1996   LIMA to LAD, SVG to D2, SVG to PDA, SVG to OM1 and OM2  . CORONARY STENT INTERVENTION N/A 12/10/2019   Procedure: CORONARY STENT INTERVENTION;   Surgeon: Burnell Blanks, MD;  Location: [place] [facility];  Service: Cardiovascular;  Laterality: N/A;  . ESOPHAGOGASTRODUODENOSCOPY N/A 03/16/2013   Procedure: ESOPHAGOGASTRODUODENOSCOPY (EGD);  Surgeon: Daneil Dolin, MD;  Location: [facility];  Service: Endoscopy;  Laterality: N/A;  12:00-moved to [place] notified pt  . ESOPHAGOGASTRODUODENOSCOPY N/A 03/06/2013   Procedure: ESOPHAGOGASTRODUODENOSCOPY (EGD);  Surgeon: Daneil Dolin, MD;  Location: [facility];  Service: Endoscopy;  Laterality: N/A;  . HERNIA REPAIR    . INTRAVASCULAR ULTRASOUND/IVUS N/A 12/10/2019   Procedure: Intravascular Ultrasound/IVUS;  Surgeon: Burnell Blanks, MD;  Location: [place] [facility];  Service: Cardiovascular;  Laterality: N/A;  . LEFT HEART CATH AND CORS/GRAFTS ANGIOGRAPHY N/A 12/10/2019   Procedure: LEFT HEART CATH AND CORS/GRAFTS ANGIOGRAPHY;  Surgeon: Burnell Blanks, MD;  Location: [place] [facility];  Service: Cardiovascular;  Laterality: N/A;   HPI:  85yo male admitted 12/09/2019 with chest pain. PMH: COPD, HTN, CHF, BPH, left bundle branch block, Alzheimer's, OSA, CAD. CXR = Mild bibasilar atelectasis new from the prior exam.   Assessment / Plan / Recommendation Clinical Impression  Pt presents with adequate oral motor strength and function. He is missing several teeth, but reports tolerance of regular solids and thin liquids. Pt also indicates a history of esophageal dysmotility and food getting stuck, but states that he does tend to eat quickly. Pt tolerated trials of thin liquid, puree, and solid textures without obvious oral issues or over s/s  aspiration. SLP provided written behavioral and dietary strategies for management of esophageal dysmotiity, and reviewed this information with pt. He verbalized understanding of the information and stated that he would "follow my advice":  Behavioral and Dietary strategies for management of esophageal dysmotility 1. Take  reflux medications 30+ minutes before other po intake, in the morning 2. Begin meals with warm beverage 3. Eat smaller more frequent meals 4. Eat slowly, taking small bites and sips 5. Alternate solids and liquids 6. Avoid foods/liquids that increase acid production 7. Sit upright during and for 30+ minutes after meals to facilitate esophageal clearing  No further ST intervention recommended at this time, however, esophageal work up may be beneficial if pt adheres to recommended strategies but continues to have difficulty with globus sensation or food sticking.    SLP Visit Diagnosis: Dysphagia, unspecified (R13.10)    Aspiration Risk  Mild aspiration risk    Diet Recommendation Thin liquid;Regular   Liquid Administration via: Cup;Straw Medication Administration: Whole meds with liquid Supervision: Patient able to self feed Compensations: Slow rate;Small sips/bites;Follow solids with liquid;Minimize environmental distractions Postural Changes: Seated upright at 90 degrees;Remain upright for at least 30 minutes after po intake    Other  Recommendations Recommended Consults: Consider esophageal assessment(if symptoms persist despite following strategies provided today.) Oral Care Recommendations: Oral care BID   Follow up Recommendations None          Prognosis Prognosis for Safe Diet Advancement: Good      Swallow Study   General Date of Onset: 12/09/19 HPI: 85yo male admitted 12/09/2019 with chest pain. PMH: COPD, HTN, CHF, BPH, left bundle branch block, Alzheimer's, OSA, CAD. CXR = Mild bibasilar atelectasis new from the prior exam. Type of Study: Bedside Swallow Evaluation Previous Swallow Assessment: none Diet Prior to this Study: Regular;Thin liquids Temperature Spikes Noted: No Respiratory Status: Room air History of Recent Intubation: No Behavior/Cognition: Alert;Cooperative;Pleasant mood Oral Cavity Assessment: Within Functional Limits Oral Care Completed by SLP:  No Oral Cavity - Dentition: Missing dentition Vision: Functional for self-feeding Self-Feeding Abilities: Able to feed self Patient Positioning: Upright in chair Baseline Vocal Quality: Normal Volitional Cough: Strong Volitional Swallow: Able to elicit    Oral/Motor/Sensory Function Overall Oral Motor/Sensory Function: Within functional limits   Ice Chips Ice chips: Not tested   Thin Liquid Thin Liquid: Within functional limits Presentation: Straw    Nectar Thick Nectar Thick Liquid: Not tested   Honey Thick Honey Thick Liquid: Not tested   Puree Puree: Within functional limits Presentation: Self Fed;Spoon   Solid     Solid: Within functional limits Presentation: [place], [place], [place] Speech Language Pathologist Office: [phone] Pager: [phone]  Shonna Chock 12/11/2019,9:53 AM

## 2019-12-11 NOTE — Discharge Summary (Signed)
Discharge Summary  Dustin [name] [id number] DOB: [birth year]  PCP: [name], MD  Admit date: 12/09/2019 Discharge date: 12/11/2019   Recommendations for Outpatient Follow-up:  1. Cardiology 2 weeks 2. PCP 2 weeks   Discharge Diagnoses:  Active Hospital Problems   Diagnosis Date Noted  . COPD (chronic obstructive pulmonary disease) ([place]) 12/10/2019  . BPH (benign prostatic hyperplasia) 11/07/2018  . Chronic diastolic CHF (congestive heart failure) ([place]) 12/24/2016  . Chest pain 06/27/2014  . Hyperlipidemia 10/01/2009  . HYPERTENSION, BENIGN 10/01/2009  . Coronary artery disease 10/01/2009    Resolved Hospital Problems  No resolved problems to display.    Discharge Condition: Stable   Diet recommendation: Previous diet   Vitals:   12/11/19 0740 12/11/19 0810  BP: (!) 170/66   Pulse: (!) 54   Resp: 18   Temp: 98.1 F (36.7 C)   SpO2: 95% 96%    Details of Hospital Course:  ARRIS RAUTH a 85 y.o.malewith medical history significant of COPD, hypertension, diastolic CHF, BPH, left bundle branch block, Alzheimer's, obstructive sleep apnea, coronary artery disease admitted with chest pain. Due to his history he underwent diagnositc and therapeutic catheterization 12/14 with DES placed in distal left main and will require at least one year of dual antiplatelet therapy with ASA/Plavix. His statin was also changed to crestor.  Procedures:  12/14 Successful PTCA/DES x 1 ostial to to distal left main artery   Consultations:  Cardiology   Discharge Exam: BP (!) 170/66 (BP Location: Right Arm)   Pulse (!) 54   Temp 98.1 F (36.7 C) (Oral)   Resp 18   Ht 5\' 9"  (1.753 m)   Wt 92.8 kg   SpO2 96%   BMI 30.20 kg/m  General:  Alert, oriented, calm, in no acute distress  Eyes: EOMI, clear sclerea Neck: supple, no masses, trachea mildline  Cardiovascular: RRR, no murmurs or rubs, no peripheral edema  Respiratory: clear to auscultation bilaterally, no  wheezes, no crackles  Abdomen: soft, nontender, nondistended, normal bowel tones heard  Skin: dry, no rashes  Musculoskeletal: no joint effusions, normal range of motion  Psychiatric: appropriate affect, normal speech  Neurologic: extraocular muscles intact, clear speech, moving all extremities with intact sensorium    Discharge Instructions You were cared for by a hospitalist during your hospital stay. If you have any questions about your discharge medications or the care you received while you were in the hospital after you are discharged, you can call the unit and asked to speak with the hospitalist on call if the hospitalist that took care of you is not available. Once you are discharged, your primary care physician will handle any further medical issues. Please note that NO REFILLS for any discharge medications will be authorized once you are discharged, as it is imperative that you return to your primary care physician (or establish a relationship with a primary care physician if you do not have one) for your aftercare needs so that they can reassess your need for medications and monitor your lab values.  Discharge Instructions    Amb Referral to Cardiac Rehabilitation   Complete by: As directed    Diagnosis: Coronary Stents   After initial evaluation and assessments completed: Virtual Based Care may be provided alone or in conjunction with Phase 2 Cardiac Rehab based on patient barriers.: Yes   Diet - low sodium heart healthy   Complete by: As directed    Increase activity slowly   Complete by: As directed  Allergies as of 12/11/2019   No Known Allergies     Medication List    STOP taking these medications   azithromycin 500 MG tablet Commonly known as: Zithromax   pravastatin 40 MG tablet Commonly known as: PRAVACHOL   predniSONE 20 MG tablet Commonly known as: DELTASONE     TAKE these medications   albuterol 108 (90 Base) MCG/ACT inhaler Commonly known as:  VENTOLIN HFA Inhale 2 puffs into the lungs every 6 (six) hours as needed for wheezing or shortness of breath.   albuterol (2.5 MG/3ML) 0.083% nebulizer solution Commonly known as: PROVENTIL Take 3 mLs (2.5 mg total) by nebulization every 6 (six) hours as needed for wheezing or shortness of breath. Patient will also need the nebulizer machine with this prescription.   aspirin 81 MG EC tablet Take 1 tablet (81 mg total) by mouth daily. Notes to patient: Prevents clotting in the heart artery/stent   Avodart 0.5 MG capsule Generic drug: dutasteride Take 0.5 mg by mouth. In the afternoon   bisacodyl 5 MG EC tablet Commonly known as: DULCOLAX Take 5 mg by mouth 2 (two) times a week. Notes to patient: Constipation    busPIRone 10 MG tablet Commonly known as: BUSPAR Take 10 mg by mouth 2 (two) times daily.   clopidogrel 75 MG tablet Commonly known as: PLAVIX Take 1 tablet (75 mg total) by mouth daily. Notes to patient: Prevents clotting in heart artery/stent   DOK 100 MG capsule Generic drug: docusate sodium Take 100 mg by mouth daily.   doxazosin 1 MG tablet Commonly known as: CARDURA Take 1 mg by mouth daily. Notes to patient: Blood pressure   escitalopram 20 MG tablet Commonly known as: LEXAPRO Take 20 mg by mouth every morning. Notes to patient: Depression/anxiety   furosemide 20 MG tablet Commonly known as: LASIX Take 20 mg by mouth 2 (two) times daily. Notes to patient: Fluid pill    gabapentin 300 MG capsule Commonly known as: NEURONTIN Take 300 mg by mouth 2 (two) times daily. Notes to patient: Nerve pain   hydrOXYzine 25 MG tablet Commonly known as: ATARAX/VISTARIL Take 25 mg by mouth 4 (four) times daily as needed for anxiety.   isosorbide mononitrate 60 MG 24 hr tablet Commonly known as: IMDUR Take 1 tablet (60 mg total) by mouth every morning. What changed:   medication strength  how much to take Notes to patient: Increases blood flow to the heart    Decreases blood pressure  DOSE CHANGE: Increased to 60 mg    levothyroxine 50 MCG tablet Commonly known as: SYNTHROID Take 50 mcg by mouth every morning. Notes to patient: Thyroid    memantine 10 MG tablet Commonly known as: NAMENDA Take 10 mg by mouth 2 (two) times daily. Notes to patient: Memory    nitroGLYCERIN 0.4 MG SL tablet Commonly known as: NITROSTAT Place 1 tablet (0.4 mg total) under the tongue every 5 (five) minutes as needed for chest pain. PLACE ONE TABLET UNDER TONGUE EVERY 5 MIN UP TO 3 DOSES AS NEEDED FORCHEST PAIN.   oxycodone 30 MG immediate release tablet Commonly known as: ROXICODONE Take 30 mg by mouth every 4 (four) hours as needed for pain. Take 1 tablet by mouth every 4 to 6 hours as needed for pain.  Max 8 /24 hours. Notes to patient: Pain medication    pantoprazole 40 MG tablet Commonly known as: PROTONIX Take 1 tablet (40 mg total) by mouth every morning. Notes to patient: Treat acid  reflux Prevents heart burn   pilocarpine 5 MG tablet Commonly known as: SALAGEN Take 5 mg by mouth 2 (two) times daily.   potassium chloride SA 20 MEQ tablet Commonly known as: KLOR-CON Take 20 mEq by mouth daily.   QUEtiapine 25 MG tablet Commonly known as: SEROQUEL Take 25 mg by mouth at bedtime.   rosuvastatin 40 MG tablet Commonly known as: CRESTOR Take 1 tablet (40 mg total) by mouth daily at 6 PM.   tamsulosin 0.4 MG Caps capsule Commonly known as: FLOMAX Take 1 capsule (0.4 mg total) by mouth daily. To help you pee.      No Known Allergies Follow-up Information    Barrett, Evelene Croon, PA-C Follow up on 12/17/2019.   Specialties: Cardiology, Radiology Why: at 3:30pm for your follow up appt.  Contact information: [address] [place] [address] [phone]            The results of significant diagnostics from this hospitalization (including imaging, microbiology, ancillary and laboratory) are listed below for reference.     Significant Diagnostic Studies: DG Chest 2 View  Result Date: 12/09/2019 CLINICAL DATA:  Chest pain for 2-3 months EXAM: CHEST - 2 VIEW COMPARISON:  12/07/2019 FINDINGS: Cardiac shadow is again enlarged but stable. Postsurgical changes are noted. Aortic calcifications are seen. The lungs are mildly hyperinflated with bibasilar atelectatic changes. No sizable effusion is noted. No focal infiltrate is seen. IMPRESSION: Mild bibasilar atelectasis new from the prior exam. Electronically Signed   By: Inez Catalina M.D.   On: 12/09/2019 20:16   DG Abd 1 View  Result Date: 12/10/2019 CLINICAL DATA:  Constipation. EXAM: ABDOMEN - 1 VIEW COMPARISON:  January 22, 2013 FINDINGS: The bowel gas pattern is normal. No radio-opaque calculi or other significant radiographic abnormality are seen. IMPRESSION: Negative. Electronically Signed   By: Constance Holster M.D.   On: 12/10/2019 00:53   CARDIAC CATHETERIZATION  Result Date: 12/10/2019  Origin lesion is 100% stenosed.  Mid Graft to Dist Graft lesion is 100% stenosed.  Mid RCA lesion is 100% stenosed.  Prox Graft lesion is 100% stenosed.  Mid LAD lesion is 99% stenosed.  Prox Cx to Mid Cx lesion is 100% stenosed.  SVG graft was visualized by angiography.  SVG graft was visualized by angiography.  Origin to Prox Graft lesion is 100% stenosed.  SVG graft was visualized by angiography.  LIMA graft was visualized by angiography.  Ost LM to Mid LM lesion is 90% stenosed.  A drug-eluting stent was successfully placed using a STENT RESOLUTE ONYX 4.5X18.  Post intervention, there is a 0% residual stenosis.  1. Severe triple vessel CAD s/p 5V CABG with 2/5 patent bypass grafts. 2. The left main artery has a moderate ostial stenosis and a severe mid stenosis. 3. The LAD has severe proximal to mid stenosis beyond the first large caliber diagonal branch. The LIMA to the LAD is patent. The vein graft to the second diagonal branch is occluded. The second  diagonal branch fills from flow through the LIMA to the LAD. The first diagonal branch is not protected by the LIMA graft. 4. The ramus intermediate branch is a moderate caliber vessel and is not protected by grafts. 5. The Circumflex is a moderate caliber vessel with chronic mid occlusion. The sequential vein graft to OM1 and OM2 is patent to OM1 but the sequential segment of the graft to OM2 is occluded. OM2 fills from left to left collaterals. 6. The RCA is a large  caliber vessel with chronic mid occlusion. The vein graft to the PDA is now occluded. The distal RCA and it's branches fill from left to right collaterals supplied through the LIMA to the LAD. 7. Successful PTCA/DES x 1 ostial to to distal left main artery. Recommendations: Continue ASA and Plavix for lifetime given the left main stenting. Continue statin and Imdur. Anticipate discharge home tomorrow.   DG Chest Port 1 View  Result Date: 12/07/2019 CLINICAL DATA:  Per triage note: Family member states pt has had altered loc for the last couple days and has been unable to void; pt has audible wheezing EXAM: PORTABLE CHEST 1 VIEW COMPARISON:  10/01/2019 and earlier studies. FINDINGS: Stable changes from prior CABG surgery. Cardiac silhouette is top-normal in size. No mediastinal or hilar masses. Lungs are clear.  No pleural effusion or pneumothorax. Skeletal structures are grossly intact. IMPRESSION: No acute cardiopulmonary disease. Electronically Signed   By: Lajean Manes M.D.   On: 12/07/2019 16:47   ECHOCARDIOGRAM COMPLETE  Result Date: 12/10/2019   ECHOCARDIOGRAM REPORT   Patient Name:   JOHNCHRISTOPHER YAMAZAKI Date of Exam: 12/10/2019 Medical Rec #:  [id number]       Height:       69.0 in Accession #:    [id number]      Weight:       210.0 lb Date of Birth:  July 20, 1934       BSA:          2.11 m Patient Age:    85 years        BP:           120/69 mmHg Patient Gender: M               HR:           54 bpm. Exam Location:  Inpatient Procedure: 2D  Echo Indications:    Chest Pain R07.9  History:        Patient has prior history of Echocardiogram examinations, most                 recent 11/08/2018. CAD and Previous Myocardial Infarction, Prior                 CABG, Arrythmias:LBBB; Risk Factors:Hypertension and                 Dyslipidemia.  Sonographer:    Mikki Santee RDCS (AE) Referring Phys: Echo  1. Left ventricular ejection fraction, by visual estimation, is 55 to 60%. The left ventricle has normal function. There is no left ventricular hypertrophy.  2. Left ventricular diastolic parameters are consistent with Grade I diastolic dysfunction (impaired relaxation).  3. Severely dilated left ventricular internal cavity size.  4. The left ventricle has no regional wall motion abnormalities.  5. No change in LVEF from echo done in 2019.  6. Global right ventricle has normal systolic function.The right ventricular size is normal. No increase in right ventricular wall thickness.  7. Left atrial size was mildly dilated.  8. Right atrial size was mildly dilated.  9. The mitral valve is normal in structure. Mild mitral valve regurgitation. 10. The tricuspid valve is grossly normal. Tricuspid valve regurgitation is trivial. 11. The aortic valve is tricuspid. Aortic valve regurgitation is not visualized. Mild aortic valve sclerosis without stenosis. 12. The pulmonic valve was grossly normal. Pulmonic valve regurgitation is trivial. 13. Normal pulmonary artery systolic pressure. 14. The interatrial septum was not assessed. FINDINGS  Left Ventricle: Left ventricular ejection fraction, by visual estimation, is 55 to 60%. The left ventricle has normal function. The left ventricle has no regional wall motion abnormalities. The left ventricular internal cavity size was severely dilated left ventricle. There is no left ventricular hypertrophy. Left ventricular diastolic parameters are consistent with Grade I diastolic dysfunction  (impaired relaxation). No change in LVEF from echo done in 2019. Right Ventricle: The right ventricular size is normal. No increase in right ventricular wall thickness. Global RV systolic function is has normal systolic function. The tricuspid regurgitant velocity is 2.23 m/s, and with an assumed right atrial pressure  of 3 mmHg, the estimated right ventricular systolic pressure is normal at 22.9 mmHg. Left Atrium: Left atrial size was mildly dilated. Right Atrium: Right atrial size was mildly dilated Pericardium: There is no evidence of pericardial effusion. Mitral Valve: The mitral valve is normal in structure. Mild mitral valve regurgitation. Tricuspid Valve: The tricuspid valve is grossly normal. Tricuspid valve regurgitation is trivial. Aortic Valve: The aortic valve is tricuspid. Aortic valve regurgitation is not visualized. Mild aortic valve sclerosis is present, with no evidence of aortic valve stenosis. Pulmonic Valve: The pulmonic valve was grossly normal. Pulmonic valve regurgitation is trivial. Pulmonic regurgitation is trivial. Aorta: The aortic root is normal in size and structure. Venous: The inferior vena cava was not well visualized. IAS/Shunts: The interatrial septum was not assessed.  LEFT VENTRICLE PLAX 2D LVIDd:         6.11 cm  Diastology LVIDs:         4.00 cm  LV e' lateral:   9.79 cm/s LV PW:         1.03 cm  LV E/e' lateral: 9.8 LV IVS:        1.04 cm  LV e' medial:    6.96 cm/s LVOT diam:     2.40 cm  LV E/e' medial:  13.8 LV SV:         118 ml LV SV Index:   53.93 LVOT Area:     4.52 cm  RIGHT VENTRICLE RV S prime:     9.36 cm/s TAPSE (M-mode): 1.4 cm LEFT ATRIUM             Index       RIGHT ATRIUM           Index LA diam:        4.60 cm 2.18 cm/m  RA Area:     23.90 cm LA Vol (A2C):   72.1 ml 34.18 ml/m RA Volume:   69.40 ml  32.90 ml/m LA Vol (A4C):   52.3 ml 24.80 ml/m LA Biplane Vol: 61.5 ml 29.16 ml/m  AORTIC VALVE LVOT Vmax:   88.60 cm/s LVOT Vmean:  53.800 cm/s LVOT VTI:     0.198 m  AORTA Ao Root diam: 3.30 cm MITRAL VALVE                         TRICUSPID VALVE MV Area (PHT): 3.53 cm              TR Peak grad:   19.9 mmHg MV PHT:        62.35 msec            TR Vmax:        223.00 cm/s MV Decel Time: 215 msec MR Peak grad: 92.2 mmHg              SHUNTS MR  Vmax:      480.00 cm/s            Systemic VTI:  0.20 m MV E velocity: 96.10 cm/s  103 cm/s  Systemic Diam: 2.40 cm MV A velocity: 115.00 cm/s 70.3 cm/s MV E/A ratio:  0.84        1.5  Dorris Carnes MD Electronically signed by Dorris Carnes MD Signature Date/Time: 12/10/2019/11:36:54 AM    Final     Microbiology: Recent Results (from the past 240 hour(s))  Urine culture     Status: Abnormal   Collection Time: 12/07/19  7:31 PM   Specimen: Urine, Clean Catch  Result Value Ref Range Status   Specimen Description   Final    URINE, CLEAN CATCH Performed at [facility], [address] [place], [place] [address]    Special Requests   Final    Normal Performed at [facility], [address] [place], [place] [address]    Culture (A)  Final    <10,000 COLONIES/mL INSIGNIFICANT GROWTH Performed at [facility], [place] [address] [place], [place] [address]    Report Status 12/09/2019 FINAL  Final  SARS CORONAVIRUS 2 (TAT 6-24 HRS) Nasopharyngeal Nasopharyngeal Swab     Status: None   Collection Time: 12/07/19  7:46 PM   Specimen: Nasopharyngeal Swab  Result Value Ref Range Status   SARS Coronavirus 2 NEGATIVE NEGATIVE Final    Comment: (NOTE) SARS-CoV-2 target nucleic acids are NOT DETECTED. The SARS-CoV-2 RNA is generally detectable in upper and lower respiratory specimens during the acute phase of infection. Negative results do not preclude SARS-CoV-2 infection, do not rule out co-infections with other pathogens, and should not be used as the sole basis for treatment or other patient management decisions. Negative results must be combined with clinical observations, patient history, and epidemiological  information. The expected result is Negative. Fact Sheet for Patients: SugarRoll.be Fact Sheet for Healthcare Providers: https://www.woods-mathews.com/ This test is not yet approved or cleared by the [place] FDA and  has been authorized for detection and/or diagnosis of SARS-CoV-2 by FDA under an Emergency Use Authorization (EUA). This EUA will remain  in effect (meaning this test can be used) for the duration of the COVID-19 declaration under Section 56 4(b)(1) of the Act, 21 U.S.C. section 360bbb-3(b)(1), unless the authorization is terminated or revoked sooner. Performed at [facility], [place] [address] [place], [place] [address]   SARS CORONAVIRUS 2 (TAT 6-24 HRS) Nasopharyngeal Nasopharyngeal Swab     Status: None   Collection Time: 12/09/19 10:48 PM   Specimen: Nasopharyngeal Swab  Result Value Ref Range Status   SARS Coronavirus 2 NEGATIVE NEGATIVE Final    Comment: (NOTE) SARS-CoV-2 target nucleic acids are NOT DETECTED. The SARS-CoV-2 RNA is generally detectable in upper and lower respiratory specimens during the acute phase of infection. Negative results do not preclude SARS-CoV-2 infection, do not rule out co-infections with other pathogens, and should not be used as the sole basis for treatment or other patient management decisions. Negative results must be combined with clinical observations, patient history, and epidemiological information. The expected result is Negative. Fact Sheet for Patients: SugarRoll.be Fact Sheet for Healthcare Providers: https://www.woods-mathews.com/ This test is not yet approved or cleared by the [place] FDA and  has been authorized for detection and/or diagnosis of SARS-CoV-2 by FDA under an Emergency Use Authorization (EUA). This EUA will remain  in effect (meaning this test can be used) for the duration of the  COVID-19 declaration under  Section 56 4(b)(1) of the Act, 21 U.S.C. section 360bbb-3(b)(1), unless the authorization is terminated or revoked sooner. Performed at [facility], [place] [address], [place], [place] [address]   Respiratory Panel by PCR     Status: None   Collection Time: 12/10/19  1:41 AM   Specimen: Nasopharyngeal Swab; Respiratory  Result Value Ref Range Status   Adenovirus NOT DETECTED NOT DETECTED Final   Coronavirus 229E NOT DETECTED NOT DETECTED Final    Comment: (NOTE) The Coronavirus on the Respiratory Panel, DOES NOT test for the novel  Coronavirus (2019 nCoV)    Coronavirus HKU1 NOT DETECTED NOT DETECTED Final   Coronavirus NL63 NOT DETECTED NOT DETECTED Final   Coronavirus OC43 NOT DETECTED NOT DETECTED Final   Metapneumovirus NOT DETECTED NOT DETECTED Final   Rhinovirus / Enterovirus NOT DETECTED NOT DETECTED Final   Influenza A NOT DETECTED NOT DETECTED Final   Influenza B NOT DETECTED NOT DETECTED Final   Parainfluenza Virus 1 NOT DETECTED NOT DETECTED Final   Parainfluenza Virus 2 NOT DETECTED NOT DETECTED Final   Parainfluenza Virus 3 NOT DETECTED NOT DETECTED Final   Parainfluenza Virus 4 NOT DETECTED NOT DETECTED Final   Respiratory Syncytial Virus NOT DETECTED NOT DETECTED Final   Bordetella pertussis NOT DETECTED NOT DETECTED Final   Chlamydophila pneumoniae NOT DETECTED NOT DETECTED Final   Mycoplasma pneumoniae NOT DETECTED NOT DETECTED Final    Comment: Performed at [facility], [place]. [address], [place], [place] [address]     Labs: Basic Metabolic Panel: Recent Labs  Lab 12/07/19 1557 12/08/19 [id number] 12/09/19 1917 12/10/19 0357 12/11/19 0438  NA 137 139 140 141 142  K 3.8 3.8 3.7 3.4* 3.7  CL 98 102 102 101 105  CO2 29 26 30 28 28   GLUCOSE 120* 170* 127* 112* 124*  BUN 23 22 18 15 14   CREATININE 1.46* 0.92 0.98 0.94 0.96  CALCIUM 8.8* 8.7* 9.1 9.1 8.6*  MG  --   --   --  1.9  --   PHOS  --   --   --  2.6  --    Liver Function Tests: Recent Labs   Lab 12/07/19 1557 12/10/19 0357  AST 28 26  ALT 21 23  ALKPHOS 57 49  BILITOT 1.0 1.1  PROT 7.1 6.5  ALBUMIN 4.1 3.8   No results for input(s): LIPASE, AMYLASE in the last 168 hours. Recent Labs  Lab 12/07/19 1557  AMMONIA 22   CBC: Recent Labs  Lab 12/07/19 1557 12/09/19 1917 12/10/19 0357 12/11/19 0438  WBC 10.0 13.7* 10.0 7.9  NEUTROABS 7.4  --  6.8  --   HGB 12.5* 12.0* 13.2 12.0*  HCT 39.2 37.9* 40.6 35.8*  MCV 96.8 95.7 94.9 92.0  PLT 214 245 252 235   Cardiac Enzymes: No results for input(s): CKTOTAL, CKMB, CKMBINDEX, TROPONINI in the last 168 hours. BNP: BNP (last 3 results) Recent Labs    12/07/19 1557  BNP 41.0    ProBNP (last 3 results) No results for input(s): PROBNP in the last 8760 hours.  CBG: Recent Labs  Lab 12/08/19 0507  GLUCAP 161*    Time spent: 32 minutes were spent in preparing this discharge including medication reconciliation, counseling, and coordination of care.  Signed:  Azarius Lambson Marry Guan, MD  [name] 12/11/2019, 12:04 PM

## 2019-12-11 NOTE — Evaluation (Signed)
Occupational Therapy Evaluation Patient Details Name: Dustin [name] MRN: [id number] DOB: [birth year] Today's Date: 12/11/2019    History of Present Illness LOUDON KONDA is a 85 y.o. male with medical history significant of COPD, hypertension, diastolic CHF, BPH, left bundle branch block, Alzheimer's, obstructive sleep apnea, coronary artery disease, admitted with chest pain.   Clinical Impression   Patient is an 85 year old male that lives with his spouse in a single level home with level entry. Patient reports he is independent at base line with basic ADLs. Currently patient is independent with sink side grooming/hygiene tasks, LB dressing tasks and functional ambulation in his room without an adaptive device. Patient appears at his base line therefore will discontinue acute OT services at this time.     Follow Up Recommendations  No OT follow up    Equipment Recommendations  None recommended by OT       Precautions / Restrictions Precautions Precautions: Fall Restrictions Weight Bearing Restrictions: No      Mobility Bed Mobility Overal bed mobility: Modified Independent             General bed mobility comments: HOB elevated  Transfers Overall transfer level: Independent Equipment used: None             General transfer comment: able to sit to stand without assist    Balance Overall balance assessment: Mild deficits observed, not formally tested   Sitting balance-Leahy Scale: Normal Sitting balance - Comments: seated EOB to doff/don socks   Standing balance support: No upper extremity supported Standing balance-Leahy Scale: Good Standing balance comment: supervision                            ADL either performed or assessed with clinical judgement   ADL Overall ADL's : Independent                                       General ADL Comments: patient ambulate to sink side without AD to brush his teeth, wash his face.  Able to doff/don socks seated EOB without assist     Vision Patient Visual Report: No change from baseline              Pertinent Vitals/Pain Pain Assessment: Faces Faces Pain Scale: Hurts a little bit Pain Location: feet due to neuropathy Pain Descriptors / Indicators: Burning;Aching Pain Intervention(s): Limited activity within patient's tolerance;Monitored during session     Hand Dominance Right   Extremity/Trunk Assessment Upper Extremity Assessment Upper Extremity Assessment: Overall WFL for tasks assessed   Lower Extremity Assessment Lower Extremity Assessment: Defer to PT evaluation   Cervical / Trunk Assessment Cervical / Trunk Assessment: Kyphotic   Communication Communication Communication: No difficulties   Cognition Arousal/Alertness: Awake/alert Behavior During Therapy: WFL for tasks assessed/performed Overall Cognitive Status: History of cognitive impairments - at baseline                                 General Comments: patient A/O x4, appropriate and cooperative throughout session   General Comments  VSS            Home Living Family/patient expects to be discharged to:: Private residence Living Arrangements: Spouse/significant other Available Help at Discharge: Family Type of Home: House Home Access: Level entry  Home Layout: One level               Home Equipment: Walker - 2 wheels;Cane - single point          Prior Functioning/Environment Level of Independence: Independent        Comments: wife helps with IADL's due to dementia        OT Problem List: Impaired balance (sitting and/or standing)                          AM-PAC OT "6 Clicks" Daily Activity     Outcome Measure Help from another person eating meals?: None Help from another person taking care of personal grooming?: None Help from another person toileting, which includes using toliet, bedpan, or urinal?: None Help from another  person bathing (including washing, rinsing, drying)?: None Help from another person to put on and taking off regular upper body clothing?: None Help from another person to put on and taking off regular lower body clothing?: None 6 Click Score: 24   End of Session Nurse Communication: Mobility status  Activity Tolerance: Patient tolerated treatment well Patient left: in bed;with call bell/phone within reach;Other (comment)(nursing made aware pt asking for morning meds)  OT Visit Diagnosis: Unsteadiness on feet (R26.81)                Time: [id number] OT Time Calculation (min): 20 min Charges:  OT General Charges $OT Visit: 1 Visit OT Evaluation $OT Eval Moderate Complexity: [place] OT OT office: [place] 12/11/2019, 9:16 AM

## 2019-12-11 NOTE — Plan of Care (Signed)
  Problem: Coping: Goal: Level of anxiety will decrease Outcome: Progressing   Problem: Pain Managment: Goal: General experience of comfort will improve Outcome: Progressing   Problem: Safety: Goal: Ability to remain free from injury will improve Outcome: Progressing   

## 2019-12-14 DIAGNOSIS — G894 Chronic pain syndrome: Secondary | ICD-10-CM | POA: Diagnosis not present

## 2019-12-14 DIAGNOSIS — Z6829 Body mass index (BMI) 29.0-29.9, adult: Secondary | ICD-10-CM | POA: Diagnosis not present

## 2019-12-14 DIAGNOSIS — I779 Disorder of arteries and arterioles, unspecified: Secondary | ICD-10-CM | POA: Diagnosis not present

## 2019-12-14 DIAGNOSIS — I249 Acute ischemic heart disease, unspecified: Secondary | ICD-10-CM | POA: Diagnosis not present

## 2019-12-17 ENCOUNTER — Ambulatory Visit (INDEPENDENT_AMBULATORY_CARE_PROVIDER_SITE_OTHER): Payer: PPO | Admitting: Physician Assistant

## 2019-12-17 ENCOUNTER — Encounter: Payer: Self-pay | Admitting: Physician Assistant

## 2019-12-17 VITALS — BP 126/69 | HR 69 | Temp 97.8°F | Ht 69.0 in | Wt 208.0 lb

## 2019-12-17 DIAGNOSIS — I25119 Atherosclerotic heart disease of native coronary artery with unspecified angina pectoris: Secondary | ICD-10-CM

## 2019-12-17 DIAGNOSIS — I10 Essential (primary) hypertension: Secondary | ICD-10-CM | POA: Diagnosis not present

## 2019-12-17 MED ORDER — NITROGLYCERIN 0.4 MG SL SUBL: 0.4000 mg | SUBLINGUAL_TABLET | SUBLINGUAL | 3 refills | Status: DC | PRN

## 2019-12-17 NOTE — Progress Notes (Signed)
Cardiology Office Note   Date:  12/17/2019   ID:  Dustin [name], DOB [birth year], MRN [id number]  PCP:  [name], MD Cardiologist:  Rozann Lesches, MD 06/14/2019 Electrphysiologist: None Rosaria Ferries, PA-C   No chief complaint on file.   History of Present Illness:  Dustin [name] is a 85 y.o. male with a history of CAD s/p remote CABG in 1996 with subsequent DES x2 to SVG to PDA in 07/2011 with distal disease managed medially, LBBB, mild bilateral carotid artery stenosis, hypertension, hyperlipidemia, obstructive sleep apnea, hypothyroidism, nl EF by echo 10/2018, and Alzheimer disease   Admitted 12/13-12/15/2020 with CP, SOB. Cath w/ DES L main and otherwise med rx for CAD  Dustin [name] presents for cardiology follow up with his wife.  He has not had any significant chest pain or SOB since discharge. He has had some minor chest pain, wonders if it was cardiac.   He denies orthopnea or PND. He has had some LE edema, normal for him.  He has not had any SOB since discharge, denies exertional symptom.   No palpitations, no presyncope or syncope.    Past Medical History:  Diagnosis Date   Alzheimer disease ([place])    Anxiety    Arthritis    Atrophic gastritis    a. By EGD 02/2013.   Carotid artery disease ([facility])    a. mild-mod plaque, <50% stenosis bilat by duplex 2018.   Coronary atherosclerosis of native coronary artery    a. Multivessel s/p CABG 1996. b. s/p DES x 2 SVG to PDA 8/12 with distal disease managed medically.   DDD (degenerative disc disease)    Chronic back pain   Enlarged prostate    Essential hypertension, benign    Hematuria    Hypothyroidism    LBBB (left bundle branch block)    MI (myocardial infarction) ([facility])    Mixed hyperlipidemia    OA (osteoarthritis)    OSA (obstructive sleep apnea)    Sinus bradycardia    a. Aricept and BB discontinued due to this.    Past Surgical History:  Procedure Laterality Date    COLONOSCOPY  08/03/2004   Jenkins-numerous large diverticula in the descending, transverse, descending, and sigmoid colon. Otherwise normal exam.   COLONOSCOPY  07/12/2012   RMR: External hemorrhoidal tag; multiple rectal and colonic polyps removed and/or treated as described above. Pancolonic diverticulosis. Bx-tubular adenomas, rectal hyperplastic polyp. next colonoscopy in 06/2015.   COLONOSCOPY N/A 06/22/2016   Procedure: COLONOSCOPY;  Surgeon: Aviva Signs, MD;  [facility];  Service: Gastroenterology;  Laterality: N/A;  16   CORONARY ANGIOPLASTY WITH STENT PLACEMENT     CORONARY ARTERY BYPASS GRAFT  1996   LIMA to LAD, SVG to D2, SVG to PDA, SVG to OM1 and OM2   CORONARY STENT INTERVENTION N/A 12/10/2019   Procedure: CORONARY STENT INTERVENTION;  Surgeon: Burnell Blanks, MD;  Location: [place] [facility];  Service: Cardiovascular;  Laterality: N/A;   ESOPHAGOGASTRODUODENOSCOPY N/A 03/16/2013   Procedure: ESOPHAGOGASTRODUODENOSCOPY (EGD);  Surgeon: Daneil Dolin, MD;  [facility]: [facility];  Service: Endoscopy;  Laterality: N/A;  12:00-moved to [place] notified pt   ESOPHAGOGASTRODUODENOSCOPY N/A 03/06/2013   Procedure: ESOPHAGOGASTRODUODENOSCOPY (EGD);  Surgeon: Daneil Dolin, MD;  [facility]: [facility];  Service: Endoscopy;  Laterality: N/A;   HERNIA REPAIR     INTRAVASCULAR ULTRASOUND/IVUS N/A 12/10/2019   Procedure: Intravascular Ultrasound/IVUS;  Surgeon: Burnell Blanks, MD;  Location: [facility]  INVASIVE CV LAB;  Service: Cardiovascular;  Laterality: N/A;   LEFT HEART CATH AND CORS/GRAFTS ANGIOGRAPHY N/A 12/10/2019   Procedure: LEFT HEART CATH AND CORS/GRAFTS ANGIOGRAPHY;  Surgeon: Burnell Blanks, MD;  Location: [place] [facility];  Service: Cardiovascular;  Laterality: N/A;    Current Outpatient Medications  Medication Sig Dispense Refill   albuterol (PROVENTIL) (2.5 MG/3ML) 0.083% nebulizer solution Take 3 mLs (2.5 mg total) by  nebulization every 6 (six) hours as needed for wheezing or shortness of breath. Patient will also need the nebulizer machine with this prescription. 75 mL 3   albuterol (VENTOLIN HFA) 108 (90 Base) MCG/ACT inhaler Inhale 2 puffs into the lungs every 6 (six) hours as needed for wheezing or shortness of breath.     aspirin EC 81 MG EC tablet Take 1 tablet (81 mg total) by mouth daily. 90 tablet 0   bisacodyl (DULCOLAX) 5 MG EC tablet Take 5 mg by mouth 2 (two) times a week.     busPIRone (BUSPAR) 10 MG tablet Take 10 mg by mouth 2 (two) times daily.      clopidogrel (PLAVIX) 75 MG tablet Take 1 tablet (75 mg total) by mouth daily. 90 tablet 3   DOK 100 MG capsule Take 100 mg by mouth daily.     doxazosin (CARDURA) 1 MG tablet Take 1 mg by mouth daily.     dutasteride (AVODART) 0.5 MG capsule Take 0.5 mg by mouth. In the afternoon     escitalopram (LEXAPRO) 20 MG tablet Take 20 mg by mouth every morning.      furosemide (LASIX) 20 MG tablet Take 20 mg by mouth 2 (two) times daily.      gabapentin (NEURONTIN) 300 MG capsule Take 300 mg by mouth 2 (two) times daily.     hydrOXYzine (ATARAX/VISTARIL) 25 MG tablet Take 25 mg by mouth 4 (four) times daily as needed for anxiety.      isosorbide mononitrate (IMDUR) 60 MG 24 hr tablet Take 1 tablet (60 mg total) by mouth every morning. 30 tablet 0   levothyroxine (SYNTHROID, LEVOTHROID) 50 MCG tablet Take 50 mcg by mouth every morning.      memantine (NAMENDA) 10 MG tablet Take 10 mg by mouth 2 (two) times daily.      nitroGLYCERIN (NITROSTAT) 0.4 MG SL tablet Place 1 tablet (0.4 mg total) under the tongue every 5 (five) minutes as needed for chest pain. PLACE ONE TABLET UNDER TONGUE EVERY 5 MIN UP TO 3 DOSES AS NEEDED FORCHEST PAIN. 25 tablet 3   oxycodone (ROXICODONE) 30 MG immediate release tablet Take 30 mg by mouth every 4 (four) hours as needed for pain. Take 1 tablet by mouth every 4 to 6 hours as needed for pain.  Max 8 /24 hours.       pantoprazole (PROTONIX) 40 MG tablet Take 1 tablet (40 mg total) by mouth every morning.     pilocarpine (SALAGEN) 5 MG tablet Take 5 mg by mouth 2 (two) times daily.     potassium chloride SA (K-DUR,KLOR-CON) 20 MEQ tablet Take 20 mEq by mouth daily.      QUEtiapine (SEROQUEL) 25 MG tablet Take 25 mg by mouth at bedtime.     rosuvastatin (CRESTOR) 40 MG tablet Take 1 tablet (40 mg total) by mouth daily at 6 PM. 30 tablet 0   tamsulosin (FLOMAX) 0.4 MG CAPS capsule Take 1 capsule (0.4 mg total) by mouth daily. To help you pee. 30 capsule 3  No current facility-administered medications for this visit.    Allergies:   Patient has no known allergies.    Social History:  The patient  reports that he quit smoking about 24 years ago. His smoking use included cigarettes. He has a 80.00 pack-year smoking history. He has never used smokeless tobacco. He reports that he does not drink alcohol or use drugs.   Family History:  The patient's family history includes Heart attack in his mother; Heart disease in an other family member.  He indicated that his mother is deceased. He indicated that his father is deceased. He indicated that the status of his neg hx is unknown. He indicated that the status of his other is unknown.    ROS:  Please see the history of present illness. All other systems are reviewed and negative.    PHYSICAL EXAM: VS:  BP 126/69    Pulse 69    Temp 97.8 F (36.6 C)    Ht 5\' 9"  (1.753 m)    Wt 94.3 kg    SpO2 91%    BMI 30.72 kg/m  , BMI Body mass index is 30.72 kg/m. GEN: Well nourished, well developed, male in no acute distress HEENT: normal for age  Neck: no JVD, no carotid bruit, no masses Cardiac: RRR; no murmur, no rubs, or gallops Respiratory:  clear to auscultation bilaterally, normal work of breathing GI: soft, nontender, nondistended, + BS MS: no deformity or atrophy; no edema; distal pulses are 2+ in all 4 extremities  Skin: warm and dry, no  rash Neuro:  Strength and sensation are intact Psych: euthymic mood, full affect   EKG:  EKG is not ordered today.  Cath: 12/10/19   Origin lesion is 100% stenosed.  Mid Graft to Dist Graft lesion is 100% stenosed.  Mid RCA lesion is 100% stenosed.  Prox Graft lesion is 100% stenosed.  Mid LAD lesion is 99% stenosed.  Prox Cx to Mid Cx lesion is 100% stenosed.  SVG graft was visualized by angiography.  SVG graft was visualized by angiography.  Origin to Prox Graft lesion is 100% stenosed.  SVG graft was visualized by angiography.  LIMA graft was visualized by angiography.  Ost LM to Mid LM lesion is 90% stenosed.  A drug-eluting stent was successfully placed using a STENT RESOLUTE ONYX 4.5X18.  Post intervention, there is a 0% residual stenosis.  1. Severe triple vessel CAD s/p 5V CABG with 2/5 patent bypass grafts.  2. The left main artery has a moderate ostial stenosis and a severe mid stenosis.  3. The LAD has severe proximal to mid stenosis beyond the first large caliber diagonal branch. The LIMA to the LAD is patent. The vein graft to the second diagonal branch is occluded. The second diagonal branch fills from flow through the LIMA to the LAD. The first diagonal branch is not protected by the LIMA graft.  4. The ramus intermediate branch is a moderate caliber vessel and is not protected by grafts.  5. The Circumflex is a moderate caliber vessel with chronic mid occlusion. The sequential vein graft to OM1 and OM2 is patent to OM1 but the sequential segment of the graft to OM2 is occluded. OM2 fills from left to left collaterals.  6. The RCA is a large caliber vessel with chronic mid occlusion. The vein graft to the PDA is now occluded. The distal RCA and it's branches fill from left to right collaterals supplied through the LIMA to the LAD.  7.  Successful PTCA/DES x 1 ostial to distal left main artery.   Recommendations: Continue ASA and Plavix for lifetime given  the left main stenting. Continue statin and Imdur. Anticipate discharge home tomorrow.   Diagnostic Dominance: Right  Intervention    TTE: 12/10/19  IMPRESSIONS   1. Left ventricular ejection fraction, by visual estimation, is 55 to 60%. The left ventricle has normal function. There is no left ventricular hypertrophy. 2. Left ventricular diastolic parameters are consistent with Grade I diastolic dysfunction (impaired relaxation). 3. Severely dilated left ventricular internal cavity size. 4. The left ventricle has no regional wall motion abnormalities. 5. No change in LVEF from echo done in 2019. 6. Global right ventricle has normal systolic function.The right ventricular size is normal. No increase in right ventricular wall thickness. 7. Left atrial size was mildly dilated. 8. Right atrial size was mildly dilated. 9. The mitral valve is normal in structure. Mild mitral valve regurgitation. 10. The tricuspid valve is grossly normal. Tricuspid valve regurgitation is trivial. 11. The aortic valve is tricuspid. Aortic valve regurgitation is not visualized. Mild aortic valve sclerosis without stenosis. 12. The pulmonic valve was grossly normal. Pulmonic valve regurgitation is trivial. 13. Normal pulmonary artery systolic pressure. 14. The interatrial septum was not assessed.  Recent Labs: 12/07/2019: B Natriuretic Peptide 41.0 12/10/2019: ALT 23; Magnesium 1.9; TSH 6.580 12/11/2019: BUN 14; Creatinine, Ser 0.96; Hemoglobin 12.0; Platelets 235; Potassium 3.7; Sodium 142  CBC    Component Value Date/Time   WBC 7.9 12/11/2019 0438   RBC 3.89 (L) 12/11/2019 0438   HGB 12.0 (L) 12/11/2019 0438   HCT 35.8 (L) 12/11/2019 0438   HCT 43 01/26/2013 0000   PLT 235 12/11/2019 0438   MCV 92.0 12/11/2019 0438   MCH 30.8 12/11/2019 0438   MCHC 33.5 12/11/2019 0438   RDW 12.6 12/11/2019 0438   LYMPHSABS 2.4 12/10/2019 0357   MONOABS 0.5 12/10/2019 0357   EOSABS 0.2  12/10/2019 0357   BASOSABS 0.0 12/10/2019 0357   CMP Latest Ref Rng & Units 12/11/2019 12/10/2019 12/09/2019  Glucose 70 - 99 mg/dL 124(H) 112(H) 127(H)  BUN 8 - 23 mg/dL 14 15 18   Creatinine 0.61 - 1.24 mg/dL 0.96 0.94 0.98  Sodium 135 - 145 mmol/L 142 141 140  Potassium 3.5 - 5.1 mmol/L 3.7 3.4(L) 3.7  Chloride 98 - 111 mmol/L 105 101 102  CO2 22 - 32 mmol/L 28 28 30   Calcium 8.9 - 10.3 mg/dL 8.6(L) 9.1 9.1  Total Protein 6.5 - 8.1 g/dL - 6.5 -  Total Bilirubin 0.3 - 1.2 mg/dL - 1.1 -  Alkaline Phos 38 - 126 U/L - 49 -  AST 15 - 41 U/L - 26 -  ALT 0 - 44 U/L - 23 -     Lipid Panel Lab Results  Component Value Date   CHOL 143 12/11/2019   HDL 37 (L) 12/11/2019   LDLCALC 77 12/11/2019   TRIG 147 12/11/2019   CHOLHDL 3.9 12/11/2019      Wt Readings from Last 3 Encounters:  12/17/19 94.3 kg  12/11/19 92.8 kg  12/07/19 96.3 kg     Other studies Reviewed: Additional studies/ records that were reviewed today include: Office notes, hospital records and testing.  ASSESSMENT AND PLAN:  1.  CAD - s/p DES L main, sx are much improved - still has distal disease in R system, more so than L system>>med rx - continue ASA, Plavix, statin, nitrates - if he has recurrent chest pain, can increase  nitrates - no BB due to resting bradycardia - wife understands to monitor for symptoms, call us prn  2. HTN - BP good on current rx, no med changes   Current medicines are reviewed at length with the patient today.  The patient does not have concerns regarding medicines.  The following changes have been made:  no change  Labs/ tests ordered today include:  No orders of the defined types were placed in this encounter.    Disposition:   FU with Rozann Lesches, MD  Signed, Rosaria Ferries, PA-C  12/17/2019 9:10 PM    [facility] Phone: [phone]; Fax: [phone]

## 2019-12-17 NOTE — Patient Instructions (Signed)
Medication Instructions:  Your physician recommends that you continue on your current medications as directed. Please refer to the Current Medication list given to you today.  *If you need a refill on your cardiac medications before your next appointment, please call your pharmacy*  Lab Work: NONE   If you have labs (blood work) drawn today and your tests are completely normal, you will receive your results only by: [name] MyChart Message (if you have MyChart) OR . A paper copy in the mail If you have any lab test that is abnormal or we need to change your treatment, we will call you to review the results.  Testing/Procedures: NONE   Follow-Up: At [facility], you and your health needs are our priority.  As part of our continuing mission to provide you with exceptional heart care, we have created designated Provider Care Teams.  These Care Teams include your primary Cardiologist (physician) and Advanced Practice Providers (APPs -  Physician Assistants and Nurse Practitioners) who all work together to provide you with the care you need, when you need it.  Your next appointment:   3 month(s)  The format for your next appointment:   In Person  Provider:   Rozann Lesches, MD  Other Instructions You are encouraged to attend cardiac rehab.   Thank you for choosing [name]!

## 2019-12-25 DIAGNOSIS — I10 Essential (primary) hypertension: Secondary | ICD-10-CM | POA: Diagnosis not present

## 2019-12-25 DIAGNOSIS — I2510 Atherosclerotic heart disease of native coronary artery without angina pectoris: Secondary | ICD-10-CM | POA: Diagnosis not present

## 2019-12-25 DIAGNOSIS — J449 Chronic obstructive pulmonary disease, unspecified: Secondary | ICD-10-CM | POA: Diagnosis not present

## 2019-12-25 DIAGNOSIS — Z6829 Body mass index (BMI) 29.0-29.9, adult: Secondary | ICD-10-CM | POA: Diagnosis not present

## 2019-12-25 DIAGNOSIS — G894 Chronic pain syndrome: Secondary | ICD-10-CM | POA: Diagnosis not present

## 2019-12-25 DIAGNOSIS — M1991 Primary osteoarthritis, unspecified site: Secondary | ICD-10-CM | POA: Diagnosis not present

## 2019-12-31 ENCOUNTER — Encounter (INDEPENDENT_AMBULATORY_CARE_PROVIDER_SITE_OTHER): Payer: PPO | Admitting: Ophthalmology

## 2020-01-14 ENCOUNTER — Emergency Department (HOSPITAL_COMMUNITY): Payer: PPO

## 2020-01-14 ENCOUNTER — Telehealth: Payer: Self-pay | Admitting: Cardiology

## 2020-01-14 ENCOUNTER — Emergency Department (HOSPITAL_COMMUNITY)
Admission: EM | Admit: 2020-01-14 | Discharge: 2020-01-15 | Disposition: A | Discharge status: Home / Self Care | Payer: PPO | Attending: Emergency Medicine | Admitting: Emergency Medicine

## 2020-01-14 ENCOUNTER — Other Ambulatory Visit: Payer: Self-pay

## 2020-01-14 ENCOUNTER — Encounter (HOSPITAL_COMMUNITY): Payer: Self-pay | Admitting: Emergency Medicine

## 2020-01-14 DIAGNOSIS — Z7982 Long term (current) use of aspirin: Secondary | ICD-10-CM | POA: Diagnosis not present

## 2020-01-14 DIAGNOSIS — G309 Alzheimer's disease, unspecified: Secondary | ICD-10-CM | POA: Insufficient documentation

## 2020-01-14 DIAGNOSIS — Z87891 Personal history of nicotine dependence: Secondary | ICD-10-CM | POA: Diagnosis not present

## 2020-01-14 DIAGNOSIS — Z79899 Other long term (current) drug therapy: Secondary | ICD-10-CM | POA: Diagnosis not present

## 2020-01-14 DIAGNOSIS — R0789 Other chest pain: Secondary | ICD-10-CM | POA: Diagnosis not present

## 2020-01-14 DIAGNOSIS — Z955 Presence of coronary angioplasty implant and graft: Secondary | ICD-10-CM | POA: Insufficient documentation

## 2020-01-14 DIAGNOSIS — I110 Hypertensive heart disease with heart failure: Secondary | ICD-10-CM | POA: Diagnosis not present

## 2020-01-14 DIAGNOSIS — R0602 Shortness of breath: Secondary | ICD-10-CM | POA: Diagnosis not present

## 2020-01-14 DIAGNOSIS — I5032 Chronic diastolic (congestive) heart failure: Secondary | ICD-10-CM | POA: Insufficient documentation

## 2020-01-14 DIAGNOSIS — I2510 Atherosclerotic heart disease of native coronary artery without angina pectoris: Secondary | ICD-10-CM | POA: Diagnosis not present

## 2020-01-14 DIAGNOSIS — E039 Hypothyroidism, unspecified: Secondary | ICD-10-CM | POA: Diagnosis not present

## 2020-01-14 DIAGNOSIS — F419 Anxiety disorder, unspecified: Secondary | ICD-10-CM | POA: Diagnosis not present

## 2020-01-14 DIAGNOSIS — Z951 Presence of aortocoronary bypass graft: Secondary | ICD-10-CM | POA: Insufficient documentation

## 2020-01-14 DIAGNOSIS — F0280 Dementia in other diseases classified elsewhere without behavioral disturbance: Secondary | ICD-10-CM | POA: Insufficient documentation

## 2020-01-14 DIAGNOSIS — R079 Chest pain, unspecified: Secondary | ICD-10-CM | POA: Diagnosis not present

## 2020-01-14 DIAGNOSIS — Z7902 Long term (current) use of antithrombotics/antiplatelets: Secondary | ICD-10-CM | POA: Insufficient documentation

## 2020-01-14 DIAGNOSIS — J449 Chronic obstructive pulmonary disease, unspecified: Secondary | ICD-10-CM | POA: Insufficient documentation

## 2020-01-14 LAB — BASIC METABOLIC PANEL
Anion gap: 10 (ref 5–15)
BUN: 10 mg/dL (ref 8–23)
CO2: 31 mmol/L (ref 22–32)
Calcium: 9 mg/dL (ref 8.9–10.3)
Chloride: 98 mmol/L (ref 98–111)
Creatinine, Ser: 1.07 mg/dL (ref 0.61–1.24)
GFR calc Af Amer: >60 mL/min (ref >60)
GFR calc non Af Amer: >60 mL/min (ref >60)
Glucose, Bld: 122 mg/dL — ABNORMAL HIGH (ref 70–99)
Potassium: 3.6 mmol/L (ref 3.5–5.1)
Sodium: 139 mmol/L (ref 135–145)

## 2020-01-14 LAB — CBC
HCT: 38.7 % — ABNORMAL LOW (ref 39.0–52.0)
Hemoglobin: 12.3 g/dL — ABNORMAL LOW (ref 13.0–17.0)
MCH: 29.4 pg (ref 26.0–34.0)
MCHC: 31.8 g/dL (ref 30.0–36.0)
MCV: 92.6 fL (ref 80.0–100.0)
Platelets: 232 10*3/uL (ref 150–400)
RBC: 4.18 MIL/uL — ABNORMAL LOW (ref 4.22–5.81)
RDW: 12.4 % (ref 11.5–15.5)
WBC: 8.7 10*3/uL (ref 4.0–10.5)
nRBC: 0 % (ref 0.0–0.2)

## 2020-01-14 LAB — TROPONIN I (HIGH SENSITIVITY): Troponin I (High Sensitivity): 7 ng/L (ref <18)

## 2020-01-14 MED ORDER — SODIUM CHLORIDE 0.9% FLUSH: 3.0000 mL | Freq: Once | INTRAVENOUS | Status: DC

## 2020-01-14 NOTE — ED Triage Notes (Addendum)
Pt to ED with c/o left chest pain   St's onset after have a stent placed several weeks ago.  Pt denies any shortness of breath or other symptoms

## 2020-01-14 NOTE — Telephone Encounter (Signed)
Pt c/o of Chest Pain: 1. Are you having CP right now? NO  Started having CP last night 2. Are you experiencing any other symptoms (ex. SOB, nausea, vomiting, sweating)? Sweating -has COPD but having shortness of breath  3. How long have you been experiencing CP? Off and on  4. Is your CP continuous or coming and going? Both   5. Have you taken Nitroglycerin? YES  Took 3 last evening 01-13-2020  Patient thinks he took 1 Nitro today.    Please call [phone].([place])

## 2020-01-14 NOTE — Telephone Encounter (Signed)
Returned pt call. Spoke with pt's wife. She states that pt has been having some chest pain off and on for past 24 hours. Pt has some sweating and SOB and has taken at least 6 Nitro in the past 12 hour with very little relief.  I advised pt to go be evaluated in the ED due to history and symptoms. [name] voiced understanding of plan. Will forward to Dr. Domenic Polite as an Juluis Rainier.

## 2020-01-15 LAB — TROPONIN I (HIGH SENSITIVITY): Troponin I (High Sensitivity): 8 ng/L (ref <18)

## 2020-01-15 MED ORDER — ALPRAZOLAM 0.25 MG PO TABS
0.2500 mg | ORAL_TABLET | Freq: Once | ORAL | Status: AC
  Administered 2020-01-15: 02:00:00 0.25 mg via ORAL
  Filled 2020-01-15: qty 1

## 2020-01-15 MED ORDER — ISOSORBIDE MONONITRATE ER 30 MG PO TB24: 90.0000 mg | ORAL_TABLET | Freq: Every day | ORAL | 0 refills | Status: DC

## 2020-01-15 NOTE — ED Provider Notes (Signed)
[facility] EMERGENCY DEPARTMENT Provider Note   CSN: [id number] Arrival date & time: 01/14/20  1846     History Chief Complaint  Patient presents with  . Chest Pain    Dustin [name] is a 86 y.o. male.  Patient with history of Alzheimer's disease, hypertension, prior MI, recent stent in 11/2019, prior CABG and 1996, presents to the emergency department with a chief complaint of chest pain.  He states that he has had fairly persistent chest pain since having his stent placed last month.  He reports associated shortness of breath.  States that his symptoms worsened today.  He reports taking 5 nitroglycerin, with no significant relief.  Still reports moderate pain on the left side of his chest along with associated shortness of breath.  He states that he feels anxious.  States that he talked to his cardiologist in [place], and was encouraged to come to the emergency department.  The history is provided by the patient. No language interpreter was used.       Past Medical History:  Diagnosis Date  . Alzheimer disease ([place])   . Anxiety   . Arthritis   . Atrophic gastritis    a. By EGD 02/2013.  . Carotid artery disease ([place])    a. mild-mod plaque, <50% stenosis bilat by duplex 2018.  [name] Coronary atherosclerosis of native coronary artery    a. Multivessel s/p CABG 1996. b. s/p DES x 2 SVG to PDA 8/12 with distal disease managed medically.  . DDD (degenerative disc disease)    Chronic back pain  . Enlarged prostate   . Essential hypertension, benign   . Hematuria   . Hypothyroidism   . LBBB (left bundle branch block)   . MI (myocardial infarction) ([place])   . Mixed hyperlipidemia   . OA (osteoarthritis)   . OSA (obstructive sleep apnea)   . Sinus bradycardia    a. Aricept and BB discontinued due to this.    Patient Active Problem List   Diagnosis Date Noted  . COPD (chronic obstructive pulmonary disease) ([place]) 12/10/2019  . COPD exacerbation ([place])  12/07/2019  . Unstable angina ([place]) 11/07/2018  . Fall 11/07/2018  . Gait instability 11/07/2018  . Leukocytosis 11/07/2018  . Depression 11/07/2018  . BPH (benign prostatic hyperplasia) 11/07/2018  . Chronic pain 11/07/2018  . Pressure injury of skin 02/15/2017  . Chronic diastolic CHF (congestive heart failure) ([place]) 12/24/2016  . Sinus bradycardia 12/23/2016  . Foot pain, bilateral 12/23/2016  . Hypokalemia 04/05/2016  . Acute respiratory failure ([place]) 04/03/2016  . Acute encephalopathy 04/03/2016  . Hypothyroidism 04/03/2016  . Aspiration pneumonia ([place]) 04/03/2016  . Dementia ([place]) 04/03/2016  . Acute respiratory failure with hypoxia ([place]) 04/03/2016  . Memory loss 04/20/2015  . Hypoxia 01/27/2015  . CAP (community acquired pneumonia) 01/27/2015  . Fever 08/30/2014  . Healthcare associated bacterial pneumonia 08/30/2014  . Toxic Metabolic encephalopathy 99991111  . Sepsis ([place]) 08/30/2014  . Neck pain on right side 08/29/2014  . Neck pain 08/29/2014  . Left-sided weakness 06/28/2014  . Chest pain 06/27/2014  . Tubular adenoma of colon 03/05/2013  . Anorexia 03/05/2013  . Loss of weight 03/05/2013  . Chronic constipation 07/04/2012  . Bronchitis 08/16/2011  . Hyperlipidemia 10/01/2009  . OSA (obstructive sleep apnea) 10/01/2009  . Alzheimer disease ([place]) 10/01/2009  . HYPERTENSION, BENIGN 10/01/2009  . Coronary artery disease 10/01/2009    Past Surgical History:  Procedure Laterality Date  . COLONOSCOPY  08/03/2004  Jenkins-numerous large diverticula in the descending, transverse, descending, and sigmoid colon. Otherwise normal exam.  . COLONOSCOPY  07/12/2012   RMR: External hemorrhoidal tag; multiple rectal and colonic polyps removed and/or treated as described above. Pancolonic diverticulosis. Bx-tubular adenomas, rectal hyperplastic polyp. next colonoscopy in 06/2015.  [name] COLONOSCOPY N/A 06/22/2016   Procedure: COLONOSCOPY;  Surgeon: Aviva Signs, MD;  [facility]  [facility];  Service: Gastroenterology;  Laterality: N/A;  730  . CORONARY ANGIOPLASTY WITH STENT PLACEMENT    . CORONARY ARTERY BYPASS GRAFT  1996   LIMA to LAD, SVG to D2, SVG to PDA, SVG to OM1 and OM2  . CORONARY STENT INTERVENTION N/A 12/10/2019   Procedure: CORONARY STENT INTERVENTION;  Surgeon: Burnell Blanks, MD;  Location: [facility];  Service: Cardiovascular;  Laterality: N/A;  . ESOPHAGOGASTRODUODENOSCOPY N/A 03/16/2013   Procedure: ESOPHAGOGASTRODUODENOSCOPY (EGD);  Surgeon: Daneil Dolin, MD;  Location: [facility];  Service: Endoscopy;  Laterality: N/A;  12:00-moved to [place] notified pt  . ESOPHAGOGASTRODUODENOSCOPY N/A 03/06/2013   Procedure: ESOPHAGOGASTRODUODENOSCOPY (EGD);  Surgeon: Daneil Dolin, MD;  [facility];  Service: Endoscopy;  Laterality: N/A;  . HERNIA REPAIR    . INTRAVASCULAR ULTRASOUND/IVUS N/A 12/10/2019   Procedure: Intravascular Ultrasound/IVUS;  Surgeon: Burnell Blanks, MD;  Location: [facility];  Service: Cardiovascular;  Laterality: N/A;  . LEFT HEART CATH AND CORS/GRAFTS ANGIOGRAPHY N/A 12/10/2019   Procedure: LEFT HEART CATH AND CORS/GRAFTS ANGIOGRAPHY;  Surgeon: Burnell Blanks, MD;  Location: [facility];  Service: Cardiovascular;  Laterality: N/A;       Family History  Problem Relation Age of Onset  . Heart disease Other   . Heart attack Mother   . Colon cancer Neg Hx     Social History   Tobacco Use  . Smoking status: Former Smoker    Packs/day: 2.00    Years: 40.00    Pack years: 80.00    Types: Cigarettes    Quit date: 12/27/1994    Years since quitting: 25.0  . Smokeless tobacco: Never Used  Substance Use Topics  . Alcohol use: No    Alcohol/week: 0.0 standard drinks  . Drug use: No    Home Medications Prior to Admission medications   Medication Sig Start Date End Date Taking? Authorizing Provider  albuterol (PROVENTIL) (2.5 MG/3ML) 0.083% nebulizer  solution Take 3 mLs (2.5 mg total) by nebulization every 6 (six) hours as needed for wheezing or shortness of breath. Patient will also need the nebulizer machine with this prescription. 10/22/18   Nat Christen, MD  albuterol (VENTOLIN HFA) 108 (90 Base) MCG/ACT inhaler Inhale 2 puffs into the lungs every 6 (six) hours as needed for wheezing or shortness of breath.    [provider]  aspirin EC 81 MG EC tablet Take 1 tablet (81 mg total) by mouth daily. 12/11/19 03/10/20  Hollice Gong, Mir Mohammed, MD  bisacodyl (DULCOLAX) 5 MG EC tablet Take 5 mg by mouth 2 (two) times a week.    [provider]  busPIRone (BUSPAR) 10 MG tablet Take 10 mg by mouth 2 (two) times daily.  11/03/18   [provider]  clopidogrel (PLAVIX) 75 MG tablet Take 1 tablet (75 mg total) by mouth daily. 08/14/19   Satira Sark, MD  DOK 100 MG capsule Take 100 mg by mouth daily. 11/19/19   [provider]  doxazosin (CARDURA) 1 MG tablet Take 1 mg by mouth daily. 11/19/19  [provider]  dutasteride (AVODART) 0.5 MG capsule Take 0.5 mg by mouth. In the afternoon    [provider]  escitalopram (LEXAPRO) 20 MG tablet Take 20 mg by mouth every morning.     [provider]  furosemide (LASIX) 20 MG tablet Take 20 mg by mouth 2 (two) times daily.  12/03/16   [provider]  gabapentin (NEURONTIN) 300 MG capsule Take 300 mg by mouth 2 (two) times daily.    [provider]  hydrOXYzine (ATARAX/VISTARIL) 25 MG tablet Take 25 mg by mouth 4 (four) times daily as needed for anxiety.  11/19/19   [provider]  isosorbide mononitrate (IMDUR) 60 MG 24 hr tablet Take 1 tablet (60 mg total) by mouth every morning. 12/11/19 01/10/20  Hollice Gong, Mir Mohammed, MD  levothyroxine (SYNTHROID, LEVOTHROID) 50 MCG tablet Take 50 mcg by mouth every morning.  10/04/16   [provider]  memantine (NAMENDA) 10 MG tablet Take 10 mg by mouth 2 (two) times  daily.  03/05/16   [provider]  nitroGLYCERIN (NITROSTAT) 0.4 MG SL tablet Place 1 tablet (0.4 mg total) under the tongue every 5 (five) minutes as needed for chest pain. PLACE ONE TABLET UNDER TONGUE EVERY 5 MIN UP TO 3 DOSES AS NEEDED FORCHEST PAIN. 12/17/19   Barrett, Evelene Croon, PA-C  oxycodone (ROXICODONE) 30 MG immediate release tablet Take 30 mg by mouth every 4 (four) hours as needed for pain. Take 1 tablet by mouth every 4 to 6 hours as needed for pain.  Max 8 /24 hours. 12/09/16   [provider]  pantoprazole (PROTONIX) 40 MG tablet Take 1 tablet (40 mg total) by mouth every morning. 12/08/19   Enzo Bi, MD  pilocarpine (SALAGEN) 5 MG tablet Take 5 mg by mouth 2 (two) times daily.    [provider]  potassium chloride SA (K-DUR,KLOR-CON) 20 MEQ tablet Take 20 mEq by mouth daily.     [provider]  QUEtiapine (SEROQUEL) 25 MG tablet Take 25 mg by mouth at bedtime. 11/19/19   [provider]  rosuvastatin (CRESTOR) 40 MG tablet Take 1 tablet (40 mg total) by mouth daily at 6 PM. 12/11/19 01/10/20  Hollice Gong, Mir Earlie Server, MD  tamsulosin (FLOMAX) 0.4 MG CAPS capsule Take 1 capsule (0.4 mg total) by mouth daily. To help you pee. 12/08/19   Enzo Bi, MD    Allergies    Patient has no known allergies.  Review of Systems   Review of Systems  All other systems reviewed and are negative.   Physical Exam Updated Vital Signs BP (!) 168/72 (BP Location: Right Arm)   Pulse 65   Temp 97.7 F (36.5 C) (Oral)   Resp 18   Ht 5\' 9"  (1.753 m)   Wt 95.3 kg   SpO2 95%   BMI 31.01 kg/m   Physical Exam Vitals and nursing note reviewed.  Constitutional:      Appearance: He is well-developed.  HENT:     Head: Normocephalic and atraumatic.  Eyes:     Conjunctiva/sclera: Conjunctivae normal.  Cardiovascular:     Rate and Rhythm: Normal rate and regular rhythm.     Heart sounds: No murmur.  Pulmonary:     Effort: Pulmonary effort is  normal. No respiratory distress.     Breath sounds: Normal breath sounds.  Abdominal:     Palpations: Abdomen is soft.     Tenderness: There is no abdominal tenderness.     Hernia:  A hernia is present.  Musculoskeletal:     Cervical back: Neck supple.  Skin:    General: Skin is warm and dry.  Neurological:     General: No focal deficit present.     Mental Status: He is alert.  Psychiatric:     Comments: Anxious     ED Results / Procedures / Treatments   Labs (all labs ordered are listed, but only abnormal results are displayed) Labs Reviewed  BASIC METABOLIC PANEL - Abnormal; Notable for the following components:      Result Value   Glucose, Bld 122 (*)    All other components within normal limits  CBC - Abnormal; Notable for the following components:   RBC 4.18 (*)    Hemoglobin 12.3 (*)    HCT 38.7 (*)    All other components within normal limits  TROPONIN I (HIGH SENSITIVITY)  TROPONIN I (HIGH SENSITIVITY)    EKG EKG Interpretation  Date/Time:  Monday January 14 2020 19:16:17 EST Ventricular Rate:  73 PR Interval:  172 QRS Duration: 128 QT Interval:  448 QTC Calculation: 493 R Axis:   49 Text Interpretation: Normal sinus rhythm Non-specific intra-ventricular conduction block T wave abnormality, consider inferolateral ischemia Abnormal ECG No significant change since last tracing Confirmed by Lacretia Leigh ([id number]) on 01/14/2020 7:31:16 PM   Radiology DG Chest 2 View  Result Date: 01/14/2020 CLINICAL DATA:  Chest pain for 1 day. Ex-smoker. EXAM: CHEST - 2 VIEW COMPARISON:  12/09/2019. FINDINGS: Stable enlarged cardiac silhouette and post CABG changes. The lungs remain clear and hyperexpanded with stable mild diffuse peribronchial thickening. Thoracic spine degenerative changes. IMPRESSION: Stable cardiomegaly and mild changes of COPD and chronic bronchitis. No acute abnormality. Electronically Signed   By: Claudie Revering M.D.   On: 01/14/2020 20:20     Procedures Procedures (including critical care time)  Medications Ordered in ED Medications  sodium chloride flush (NS) 0.9 % injection 3 mL (has no administration in time range)    ED Course  I have reviewed the triage vital signs and the nursing notes.  Pertinent labs & imaging results that were available during my care of the patient were reviewed by me and considered in my medical decision making (see chart for details).    MDM Rules/Calculators/A&P                      Patient with chest pain and shortness of breath.  Patient has had fairly persistent chest pain x1 month.  He had a left heart cath on 12/14 with a stent placed.  He states that he initially felt well after the cath, but since then has had some persistent pain.  States that the symptoms worsened today with some associated shortness of breath.  He was advised to come to the emergency department.  No new EKG changes.  Troponins x2 are negative.  I think that the length of time that the patient has been having the symptoms, with 2 - troponins, is reassuring that he does not have ACS.  I discussed case with Dr. Paticia Stack from cardiology, who recommends increasing patient's Imdur to 90 mg daily.  Patient was instructed to (3) 30 mg tablets.  I advised him that he cannot cut his 60 mg extended release tablets.  Patient seen by and discussed with Dr. Leonette Monarch, who agrees with the plan.  Final Clinical Impression(s) / ED Diagnoses Final diagnoses:  Nonspecific chest pain    Rx / DC Orders ED  Discharge Orders         Ordered    isosorbide mononitrate (IMDUR) 30 MG 24 hr tablet  Daily     01/15/20 0149           Montine Circle, PA-C 01/15/20 0153    Fatima Blank, MD 01/15/20 0500

## 2020-01-15 NOTE — ED Notes (Signed)
Patient verbalizes understanding of discharge instructions. Opportunity for questioning and answers were provided. Armband removed by staff, pt discharged from ED.  

## 2020-01-15 NOTE — Discharge Instructions (Addendum)
Stop taking your current IMDUR/Isosorbide Mononitrate tablets.    START taking the new prescription as directed.  This is a dosage adjustment of the same medicine, but the 60mg  tablets that you had cannot be cut or crushed, so you should take (3) 30 mg tablets, which I have prescribed.

## 2020-01-15 NOTE — ED Notes (Signed)
Pt advised NT that his CP is getting worse and that he has taken (5) nitro's since being placed in the lobby.

## 2020-01-21 ENCOUNTER — Encounter: Payer: Self-pay | Admitting: Neurology

## 2020-01-31 ENCOUNTER — Ambulatory Visit: Payer: PPO | Admitting: Neurology

## 2020-02-10 DIAGNOSIS — R410 Disorientation, unspecified: Secondary | ICD-10-CM | POA: Diagnosis not present

## 2020-02-10 DIAGNOSIS — R300 Dysuria: Secondary | ICD-10-CM | POA: Diagnosis not present

## 2020-02-10 DIAGNOSIS — F0390 Unspecified dementia without behavioral disturbance: Secondary | ICD-10-CM | POA: Diagnosis not present

## 2020-02-12 ENCOUNTER — Encounter (HOSPITAL_COMMUNITY): Payer: Self-pay | Admitting: *Deleted

## 2020-02-12 ENCOUNTER — Other Ambulatory Visit: Payer: Self-pay

## 2020-02-12 ENCOUNTER — Inpatient Hospital Stay (HOSPITAL_COMMUNITY)
Admission: EM | Admit: 2020-02-12 | Discharge: 2020-02-16 | DRG: 177 | Disposition: A | Discharge status: Home / Self Care | Payer: PPO | Attending: Internal Medicine | Admitting: Internal Medicine

## 2020-02-12 DIAGNOSIS — J9621 Acute and chronic respiratory failure with hypoxia: Secondary | ICD-10-CM | POA: Diagnosis present

## 2020-02-12 DIAGNOSIS — U071 COVID-19: Principal | ICD-10-CM | POA: Diagnosis present

## 2020-02-12 DIAGNOSIS — F329 Major depressive disorder, single episode, unspecified: Secondary | ICD-10-CM | POA: Diagnosis present

## 2020-02-12 DIAGNOSIS — E782 Mixed hyperlipidemia: Secondary | ICD-10-CM | POA: Diagnosis present

## 2020-02-12 DIAGNOSIS — F419 Anxiety disorder, unspecified: Secondary | ICD-10-CM | POA: Diagnosis present

## 2020-02-12 DIAGNOSIS — K219 Gastro-esophageal reflux disease without esophagitis: Secondary | ICD-10-CM | POA: Diagnosis present

## 2020-02-12 DIAGNOSIS — G4733 Obstructive sleep apnea (adult) (pediatric): Secondary | ICD-10-CM | POA: Diagnosis not present

## 2020-02-12 DIAGNOSIS — I2510 Atherosclerotic heart disease of native coronary artery without angina pectoris: Secondary | ICD-10-CM | POA: Diagnosis present

## 2020-02-12 DIAGNOSIS — Z8249 Family history of ischemic heart disease and other diseases of the circulatory system: Secondary | ICD-10-CM

## 2020-02-12 DIAGNOSIS — D649 Anemia, unspecified: Secondary | ICD-10-CM | POA: Diagnosis present

## 2020-02-12 DIAGNOSIS — F0280 Dementia in other diseases classified elsewhere without behavioral disturbance: Secondary | ICD-10-CM | POA: Diagnosis present

## 2020-02-12 DIAGNOSIS — J440 Chronic obstructive pulmonary disease with acute lower respiratory infection: Secondary | ICD-10-CM | POA: Diagnosis present

## 2020-02-12 DIAGNOSIS — R0902 Hypoxemia: Secondary | ICD-10-CM

## 2020-02-12 DIAGNOSIS — J441 Chronic obstructive pulmonary disease with (acute) exacerbation: Secondary | ICD-10-CM | POA: Diagnosis present

## 2020-02-12 DIAGNOSIS — E871 Hypo-osmolality and hyponatremia: Secondary | ICD-10-CM | POA: Diagnosis present

## 2020-02-12 DIAGNOSIS — E876 Hypokalemia: Secondary | ICD-10-CM | POA: Diagnosis not present

## 2020-02-12 DIAGNOSIS — Z87891 Personal history of nicotine dependence: Secondary | ICD-10-CM

## 2020-02-12 DIAGNOSIS — I252 Old myocardial infarction: Secondary | ICD-10-CM | POA: Diagnosis not present

## 2020-02-12 DIAGNOSIS — J1282 Pneumonia due to coronavirus disease 2019: Secondary | ICD-10-CM | POA: Diagnosis not present

## 2020-02-12 DIAGNOSIS — Z79899 Other long term (current) drug therapy: Secondary | ICD-10-CM

## 2020-02-12 DIAGNOSIS — N400 Enlarged prostate without lower urinary tract symptoms: Secondary | ICD-10-CM | POA: Diagnosis present

## 2020-02-12 DIAGNOSIS — M1990 Unspecified osteoarthritis, unspecified site: Secondary | ICD-10-CM | POA: Diagnosis present

## 2020-02-12 DIAGNOSIS — E039 Hypothyroidism, unspecified: Secondary | ICD-10-CM | POA: Diagnosis present

## 2020-02-12 DIAGNOSIS — G309 Alzheimer's disease, unspecified: Secondary | ICD-10-CM | POA: Diagnosis present

## 2020-02-12 DIAGNOSIS — I10 Essential (primary) hypertension: Secondary | ICD-10-CM | POA: Diagnosis present

## 2020-02-12 DIAGNOSIS — I5032 Chronic diastolic (congestive) heart failure: Secondary | ICD-10-CM | POA: Diagnosis present

## 2020-02-12 DIAGNOSIS — R930 Abnormal findings on diagnostic imaging of skull and head, not elsewhere classified: Secondary | ICD-10-CM | POA: Diagnosis not present

## 2020-02-12 DIAGNOSIS — J9601 Acute respiratory failure with hypoxia: Secondary | ICD-10-CM | POA: Diagnosis not present

## 2020-02-12 DIAGNOSIS — Z7902 Long term (current) use of antithrombotics/antiplatelets: Secondary | ICD-10-CM | POA: Diagnosis not present

## 2020-02-12 DIAGNOSIS — I110 Hypertensive heart disease with heart failure: Secondary | ICD-10-CM | POA: Diagnosis present

## 2020-02-12 DIAGNOSIS — R0602 Shortness of breath: Secondary | ICD-10-CM | POA: Diagnosis not present

## 2020-02-12 DIAGNOSIS — Z951 Presence of aortocoronary bypass graft: Secondary | ICD-10-CM

## 2020-02-12 DIAGNOSIS — Z7982 Long term (current) use of aspirin: Secondary | ICD-10-CM

## 2020-02-12 DIAGNOSIS — Z79890 Hormone replacement therapy (postmenopausal): Secondary | ICD-10-CM

## 2020-02-12 DIAGNOSIS — G894 Chronic pain syndrome: Secondary | ICD-10-CM | POA: Diagnosis present

## 2020-02-12 DIAGNOSIS — Z955 Presence of coronary angioplasty implant and graft: Secondary | ICD-10-CM

## 2020-02-12 DIAGNOSIS — Z8701 Personal history of pneumonia (recurrent): Secondary | ICD-10-CM

## 2020-02-12 NOTE — ED Triage Notes (Signed)
Pt brought in by daughter in law who states pt has been more confused and weak for the last couple of weeks; pt was seen at [facility] on 2/14, no acute findings were found; pt states he hurts all over; a home health nurse came out today and found pt to have a temp of 99.9; pt's O2 sats have been running low while at home; pt fell one day last week while in the shower

## 2020-02-13 ENCOUNTER — Emergency Department (HOSPITAL_COMMUNITY): Payer: PPO

## 2020-02-13 DIAGNOSIS — E782 Mixed hyperlipidemia: Secondary | ICD-10-CM | POA: Diagnosis present

## 2020-02-13 DIAGNOSIS — D649 Anemia, unspecified: Secondary | ICD-10-CM | POA: Diagnosis present

## 2020-02-13 DIAGNOSIS — G894 Chronic pain syndrome: Secondary | ICD-10-CM | POA: Diagnosis present

## 2020-02-13 DIAGNOSIS — R0902 Hypoxemia: Secondary | ICD-10-CM | POA: Diagnosis present

## 2020-02-13 DIAGNOSIS — Z7902 Long term (current) use of antithrombotics/antiplatelets: Secondary | ICD-10-CM | POA: Diagnosis not present

## 2020-02-13 DIAGNOSIS — I10 Essential (primary) hypertension: Secondary | ICD-10-CM | POA: Diagnosis not present

## 2020-02-13 DIAGNOSIS — I110 Hypertensive heart disease with heart failure: Secondary | ICD-10-CM | POA: Diagnosis present

## 2020-02-13 DIAGNOSIS — U071 COVID-19: Secondary | ICD-10-CM | POA: Diagnosis present

## 2020-02-13 DIAGNOSIS — J441 Chronic obstructive pulmonary disease with (acute) exacerbation: Secondary | ICD-10-CM | POA: Diagnosis present

## 2020-02-13 DIAGNOSIS — F0280 Dementia in other diseases classified elsewhere without behavioral disturbance: Secondary | ICD-10-CM | POA: Diagnosis present

## 2020-02-13 DIAGNOSIS — E039 Hypothyroidism, unspecified: Secondary | ICD-10-CM

## 2020-02-13 DIAGNOSIS — J1282 Pneumonia due to coronavirus disease 2019: Secondary | ICD-10-CM | POA: Diagnosis present

## 2020-02-13 DIAGNOSIS — E876 Hypokalemia: Secondary | ICD-10-CM

## 2020-02-13 DIAGNOSIS — E871 Hypo-osmolality and hyponatremia: Secondary | ICD-10-CM | POA: Diagnosis present

## 2020-02-13 DIAGNOSIS — M1990 Unspecified osteoarthritis, unspecified site: Secondary | ICD-10-CM | POA: Diagnosis present

## 2020-02-13 DIAGNOSIS — G309 Alzheimer's disease, unspecified: Secondary | ICD-10-CM | POA: Diagnosis present

## 2020-02-13 DIAGNOSIS — I252 Old myocardial infarction: Secondary | ICD-10-CM | POA: Diagnosis not present

## 2020-02-13 DIAGNOSIS — I5032 Chronic diastolic (congestive) heart failure: Secondary | ICD-10-CM | POA: Diagnosis present

## 2020-02-13 DIAGNOSIS — G4733 Obstructive sleep apnea (adult) (pediatric): Secondary | ICD-10-CM | POA: Diagnosis present

## 2020-02-13 DIAGNOSIS — J440 Chronic obstructive pulmonary disease with acute lower respiratory infection: Secondary | ICD-10-CM | POA: Diagnosis present

## 2020-02-13 DIAGNOSIS — N400 Enlarged prostate without lower urinary tract symptoms: Secondary | ICD-10-CM | POA: Diagnosis present

## 2020-02-13 DIAGNOSIS — K219 Gastro-esophageal reflux disease without esophagitis: Secondary | ICD-10-CM | POA: Diagnosis present

## 2020-02-13 DIAGNOSIS — J9601 Acute respiratory failure with hypoxia: Secondary | ICD-10-CM

## 2020-02-13 DIAGNOSIS — I2510 Atherosclerotic heart disease of native coronary artery without angina pectoris: Secondary | ICD-10-CM | POA: Diagnosis present

## 2020-02-13 DIAGNOSIS — J9621 Acute and chronic respiratory failure with hypoxia: Secondary | ICD-10-CM | POA: Diagnosis present

## 2020-02-13 DIAGNOSIS — F329 Major depressive disorder, single episode, unspecified: Secondary | ICD-10-CM | POA: Diagnosis present

## 2020-02-13 DIAGNOSIS — F419 Anxiety disorder, unspecified: Secondary | ICD-10-CM | POA: Diagnosis present

## 2020-02-13 LAB — COMPREHENSIVE METABOLIC PANEL
ALT: 33 U/L (ref 0–44)
AST: 54 U/L — ABNORMAL HIGH (ref 15–41)
Albumin: 3.3 g/dL — ABNORMAL LOW (ref 3.5–5.0)
Alkaline Phosphatase: 38 U/L (ref 38–126)
Anion gap: 10 (ref 5–15)
BUN: 13 mg/dL (ref 8–23)
CO2: 26 mmol/L (ref 22–32)
Calcium: 8.2 mg/dL — ABNORMAL LOW (ref 8.9–10.3)
Chloride: 96 mmol/L — ABNORMAL LOW (ref 98–111)
Creatinine, Ser: 1.09 mg/dL (ref 0.61–1.24)
GFR calc Af Amer: >60 mL/min (ref >60)
GFR calc non Af Amer: >60 mL/min (ref >60)
Glucose, Bld: 147 mg/dL — ABNORMAL HIGH (ref 70–99)
Potassium: 3.4 mmol/L — ABNORMAL LOW (ref 3.5–5.1)
Sodium: 132 mmol/L — ABNORMAL LOW (ref 135–145)
Total Bilirubin: 0.9 mg/dL (ref 0.3–1.2)
Total Protein: 6.5 g/dL (ref 6.5–8.1)

## 2020-02-13 LAB — CBC WITH DIFFERENTIAL/PLATELET
Abs Immature Granulocytes: 0.03 10*3/uL (ref 0.00–0.07)
Basophils Absolute: 0 10*3/uL (ref 0.0–0.1)
Basophils Relative: 0 %
Eosinophils Absolute: 0 10*3/uL (ref 0.0–0.5)
Eosinophils Relative: 0 %
HCT: 34.7 % — ABNORMAL LOW (ref 39.0–52.0)
Hemoglobin: 11 g/dL — ABNORMAL LOW (ref 13.0–17.0)
Immature Granulocytes: 0 %
Lymphocytes Relative: 10 %
Lymphs Abs: 0.7 10*3/uL (ref 0.7–4.0)
MCH: 29.1 pg (ref 26.0–34.0)
MCHC: 31.7 g/dL (ref 30.0–36.0)
MCV: 91.8 fL (ref 80.0–100.0)
Monocytes Absolute: 0.5 10*3/uL (ref 0.1–1.0)
Monocytes Relative: 8 %
Neutro Abs: 5.5 10*3/uL (ref 1.7–7.7)
Neutrophils Relative %: 82 %
Platelets: 228 10*3/uL (ref 150–400)
RBC: 3.78 MIL/uL — ABNORMAL LOW (ref 4.22–5.81)
RDW: 13.2 % (ref 11.5–15.5)
WBC: 6.8 10*3/uL (ref 4.0–10.5)
nRBC: 0 % (ref 0.0–0.2)

## 2020-02-13 LAB — D-DIMER, QUANTITATIVE: D-Dimer, Quant: 1.47 ug/mL-FEU — ABNORMAL HIGH (ref 0.00–0.50)

## 2020-02-13 LAB — RESPIRATORY PANEL BY RT PCR (FLU A&B, COVID)
Influenza A by PCR: NEGATIVE
Influenza B by PCR: NEGATIVE
SARS Coronavirus 2 by RT PCR: POSITIVE — AB

## 2020-02-13 LAB — POC SARS CORONAVIRUS 2 AG -  ED: SARS Coronavirus 2 Ag: NEGATIVE

## 2020-02-13 LAB — ABO/RH: ABO/RH(D): O POS

## 2020-02-13 LAB — TROPONIN I (HIGH SENSITIVITY)
Troponin I (High Sensitivity): 10 ng/L (ref <18)
Troponin I (High Sensitivity): 11 ng/L (ref <18)

## 2020-02-13 LAB — FERRITIN: Ferritin: 328 ng/mL (ref 24–336)

## 2020-02-13 LAB — LACTATE DEHYDROGENASE: LDH: 274 U/L — ABNORMAL HIGH (ref 98–192)

## 2020-02-13 LAB — PROCALCITONIN: Procalcitonin: <0.1 ng/mL

## 2020-02-13 LAB — C-REACTIVE PROTEIN: CRP: 7.2 mg/dL — ABNORMAL HIGH (ref <1.0)

## 2020-02-13 LAB — FIBRINOGEN: Fibrinogen: 601 mg/dL — ABNORMAL HIGH (ref 210–475)

## 2020-02-13 LAB — BRAIN NATRIURETIC PEPTIDE: B Natriuretic Peptide: 44 pg/mL (ref 0.0–100.0)

## 2020-02-13 LAB — TRIGLYCERIDES: Triglycerides: 83 mg/dL (ref <150)

## 2020-02-13 MED ORDER — ZINC SULFATE 220 (50 ZN) MG PO CAPS
220.0000 mg | ORAL_CAPSULE | Freq: Every day | ORAL | Status: DC
  Administered 2020-02-13 – 2020-02-16 (×4): 220 mg via ORAL
  Filled 2020-02-13 (×4): qty 1

## 2020-02-13 MED ORDER — POTASSIUM CHLORIDE 10 MEQ/100ML IV SOLN
10.0000 meq | INTRAVENOUS | Status: AC
  Administered 2020-02-13 (×2): 10 meq via INTRAVENOUS
  Filled 2020-02-13: qty 100

## 2020-02-13 MED ORDER — IOHEXOL 350 MG/ML SOLN
100.0000 mL | Freq: Once | INTRAVENOUS | Status: AC | PRN
  Administered 2020-02-13: 100 mL via INTRAVENOUS

## 2020-02-13 MED ORDER — MELATONIN 3 MG PO TABS
6.0000 mg | ORAL_TABLET | Freq: Every evening | ORAL | Status: DC | PRN
  Administered 2020-02-13 – 2020-02-14 (×2): 6 mg via ORAL
  Filled 2020-02-13 (×2): qty 2

## 2020-02-13 MED ORDER — ENOXAPARIN SODIUM 40 MG/0.4ML ~~LOC~~ SOLN
40.0000 mg | SUBCUTANEOUS | Status: DC
  Administered 2020-02-13 – 2020-02-16 (×4): 40 mg via SUBCUTANEOUS
  Filled 2020-02-13 (×5): qty 0.4

## 2020-02-13 MED ORDER — HYDROCOD POLST-CPM POLST ER 10-8 MG/5ML PO SUER
5.0000 mL | Freq: Two times a day (BID) | ORAL | Status: DC | PRN
  Administered 2020-02-14: 5 mL via ORAL
  Filled 2020-02-13: qty 5

## 2020-02-13 MED ORDER — ALBUTEROL SULFATE HFA 108 (90 BASE) MCG/ACT IN AERS
2.0000 | INHALATION_SPRAY | Freq: Three times a day (TID) | RESPIRATORY_TRACT | Status: DC
  Administered 2020-02-14 (×3): 2 via RESPIRATORY_TRACT

## 2020-02-13 MED ORDER — SODIUM CHLORIDE 0.9 % IV SOLN
200.0000 mg | Freq: Once | INTRAVENOUS | Status: AC
  Administered 2020-02-13: 200 mg via INTRAVENOUS
  Filled 2020-02-13: qty 40

## 2020-02-13 MED ORDER — ALBUTEROL SULFATE HFA 108 (90 BASE) MCG/ACT IN AERS: 2.0000 | INHALATION_SPRAY | Freq: Four times a day (QID) | RESPIRATORY_TRACT | Status: DC | PRN

## 2020-02-13 MED ORDER — ADULT MULTIVITAMIN W/MINERALS CH
1.0000 | ORAL_TABLET | Freq: Every day | ORAL | Status: DC
  Administered 2020-02-13 – 2020-02-16 (×4): 1 via ORAL
  Filled 2020-02-13 (×4): qty 1

## 2020-02-13 MED ORDER — DEXAMETHASONE SODIUM PHOSPHATE 10 MG/ML IJ SOLN: 6.0000 mg | Freq: Once | INTRAMUSCULAR | Status: DC

## 2020-02-13 MED ORDER — NON FORMULARY: 6.0000 mg | Freq: Every evening | Status: DC | PRN

## 2020-02-13 MED ORDER — POTASSIUM CHLORIDE 10 MEQ/100ML IV SOLN: 10.0000 meq | INTRAVENOUS | Status: AC

## 2020-02-13 MED ORDER — ALBUTEROL SULFATE HFA 108 (90 BASE) MCG/ACT IN AERS
2.0000 | INHALATION_SPRAY | Freq: Four times a day (QID) | RESPIRATORY_TRACT | Status: DC
  Administered 2020-02-13 (×3): 2 via RESPIRATORY_TRACT
  Filled 2020-02-13: qty 6.7

## 2020-02-13 MED ORDER — FOLIC ACID 1 MG PO TABS
1.0000 mg | ORAL_TABLET | Freq: Every day | ORAL | Status: DC
  Administered 2020-02-13 – 2020-02-16 (×4): 1 mg via ORAL
  Filled 2020-02-13 (×4): qty 1

## 2020-02-13 MED ORDER — ACETAMINOPHEN 325 MG PO TABS
650.0000 mg | ORAL_TABLET | Freq: Four times a day (QID) | ORAL | Status: DC | PRN
  Administered 2020-02-13 – 2020-02-16 (×2): 650 mg via ORAL
  Filled 2020-02-13 (×2): qty 2

## 2020-02-13 MED ORDER — ASCORBIC ACID 500 MG PO TABS
500.0000 mg | ORAL_TABLET | Freq: Every day | ORAL | Status: DC
  Administered 2020-02-13 – 2020-02-16 (×4): 500 mg via ORAL
  Filled 2020-02-13 (×4): qty 1

## 2020-02-13 MED ORDER — DEXAMETHASONE SODIUM PHOSPHATE 10 MG/ML IJ SOLN
6.0000 mg | INTRAMUSCULAR | Status: DC
  Administered 2020-02-13 – 2020-02-16 (×4): 6 mg via INTRAVENOUS
  Filled 2020-02-13 (×4): qty 1

## 2020-02-13 MED ORDER — GUAIFENESIN-DM 100-10 MG/5ML PO SYRP: 10.0000 mL | ORAL_SOLUTION | ORAL | Status: DC | PRN

## 2020-02-13 MED ORDER — AZITHROMYCIN 250 MG PO TABS
250.0000 mg | ORAL_TABLET | Freq: Every day | ORAL | Status: DC
  Administered 2020-02-14: 250 mg via ORAL
  Filled 2020-02-13: qty 1

## 2020-02-13 MED ORDER — OXYCODONE HCL 5 MG PO TABS
20.0000 mg | ORAL_TABLET | Freq: Three times a day (TID) | ORAL | Status: DC | PRN
  Administered 2020-02-13 – 2020-02-15 (×6): 20 mg via ORAL
  Filled 2020-02-13 (×6): qty 4

## 2020-02-13 MED ORDER — AZITHROMYCIN 250 MG PO TABS
500.0000 mg | ORAL_TABLET | Freq: Every day | ORAL | Status: AC
  Administered 2020-02-13: 500 mg via ORAL
  Filled 2020-02-13: qty 2

## 2020-02-13 MED ORDER — SODIUM CHLORIDE 0.9 % IV SOLN
100.0000 mg | Freq: Every day | INTRAVENOUS | Status: DC
  Administered 2020-02-14 – 2020-02-16 (×3): 100 mg via INTRAVENOUS
  Filled 2020-02-13 (×4): qty 20

## 2020-02-13 NOTE — H&P (Signed)
History and Physical  Dustin [name] [id number] DOB: 04/20/1934 DOA: 02/12/2020  Referring physician: Rolland Porter MD PCP: [name], MD  Patient coming from: Home  Chief Complaint: Shortness of breath  HPI: Dustin [name] is a 86 y.o. male with medical history significant for COPD, hypertension, diastolic CHF, BPH, left bundle branch block, Alzheimer's, obstructive sleep apnea and coronary artery disease who presents to the emergency department due to 3-day onset of worsening confusion.  Patient was unable to provide history on why he came to the ED.  History was obtained from ED physician and ED chart.  Per report, patient was reported to be talking to his dead parents since last 3 days.  Patient was recently seen at [facility] on the 14th with normal tests at that time.  Patient used to be on home oxygen but this was stopped by his PCP.  Patient' complaint of shortness of breath, so pulse ox was checked and it was in the 70s.  Due to patient's confusion and low level of oxygen, EMS was activated and patient was taken to the ED for further evaluation and management.  There was no report of fever, nausea, vomiting, sore throat, abdominal pain or diarrhea.  ED Course:  In the emergency department, he was hypoxic with an O2 sat of chest with contrast %.  Other vital signs are within normal range.  Work-up in the ED showed normocytic anemia, hyponatremia, hypokalemia, elevated D-dimer at 1.47, CRP 7.2, LDH 274,AST 54. SARS coronavirus was positive.  CT angiography chest with contrast ruled out pulmonary embolus but showed bilateral patchy groundglass opacity within both lungs, with slight basilar predominance and with findings suspicious for COVID-19 pneumonia.  Chest x-ray showed no active disease. Breathing treatment was provided and hospitalist was asked to admit patient for further evaluation and management.  Review of Systems: This cannot be obtained at this time due to patien's  altered mental status  Past Medical History:  Diagnosis Date  . Alzheimer disease ([place])   . Anxiety   . Arthritis   . Atrophic gastritis    a. By EGD 02/2013.  . Carotid artery disease ([place])    a. mild-mod plaque, <50% stenosis bilat by duplex 2018.  [name] Coronary atherosclerosis of native coronary artery    a. Multivessel s/p CABG 1996. b. s/p DES x 2 SVG to PDA 8/12 with distal disease managed medically.  . DDD (degenerative disc disease)    Chronic back pain  . Enlarged prostate   . Essential hypertension, benign   . Hematuria   . Hypothyroidism   . LBBB (left bundle branch block)   . MI (myocardial infarction) ([place])   . Mixed hyperlipidemia   . OA (osteoarthritis)   . OSA (obstructive sleep apnea)   . Sinus bradycardia    a. Aricept and BB discontinued due to this.   Past Surgical History:  Procedure Laterality Date  . COLONOSCOPY  08/03/2004   Jenkins-numerous large diverticula in the descending, transverse, descending, and sigmoid colon. Otherwise normal exam.  . COLONOSCOPY  07/12/2012   RMR: External hemorrhoidal tag; multiple rectal and colonic polyps removed and/or treated as described above. Pancolonic diverticulosis. Bx-tubular adenomas, rectal hyperplastic polyp. next colonoscopy in 06/2015.  [name] COLONOSCOPY N/A 06/22/2016   Procedure: COLONOSCOPY;  Surgeon: Aviva Signs, MD;  [facility];  Service: Gastroenterology;  Laterality: N/A;  730  . CORONARY ANGIOPLASTY WITH STENT PLACEMENT    . CORONARY ARTERY BYPASS GRAFT  1996   LIMA to  LAD, SVG to D2, SVG to PDA, SVG to OM1 and OM2  . CORONARY STENT INTERVENTION N/A 12/10/2019   Procedure: CORONARY STENT INTERVENTION;  Surgeon: Burnell Blanks, MD;  Location: [place] [facility];  Service: Cardiovascular;  Laterality: N/A;  . ESOPHAGOGASTRODUODENOSCOPY N/A 03/16/2013   Procedure: ESOPHAGOGASTRODUODENOSCOPY (EGD);  Surgeon: Daneil Dolin, MD;  [facility];  Service: Endoscopy;  Laterality: N/A;   12:00-moved to [facility] notified pt  . ESOPHAGOGASTRODUODENOSCOPY N/A 03/06/2013   Procedure: ESOPHAGOGASTRODUODENOSCOPY (EGD);  Surgeon: Daneil Dolin, MD;  [facility];  Service: Endoscopy;  Laterality: N/A;  . HERNIA REPAIR    . INTRAVASCULAR ULTRASOUND/IVUS N/A 12/10/2019   Procedure: Intravascular Ultrasound/IVUS;  Surgeon: Burnell Blanks, MD;  Location: [place] [facility];  Service: Cardiovascular;  Laterality: N/A;  . LEFT HEART CATH AND CORS/GRAFTS ANGIOGRAPHY N/A 12/10/2019   Procedure: LEFT HEART CATH AND CORS/GRAFTS ANGIOGRAPHY;  Surgeon: Burnell Blanks, MD;  Location: [place] [facility];  Service: Cardiovascular;  Laterality: N/A;    Social History:  reports that he quit smoking about 25 years ago. His smoking use included cigarettes. He has a 80.00 pack-year smoking history. He has never used smokeless tobacco. He reports that he does not drink alcohol or use drugs.   No Known Allergies  Family History  Problem Relation Age of Onset  . Heart disease Other   . Heart attack Mother   . Colon cancer Neg Hx      Prior to Admission medications   Medication Sig Start Date End Date Taking? Authorizing Provider  albuterol (PROVENTIL) (2.5 MG/3ML) 0.083% nebulizer solution Take 3 mLs (2.5 mg total) by nebulization every 6 (six) hours as needed for wheezing or shortness of breath. Patient will also need the nebulizer machine with this prescription. 10/22/18   Nat Christen, MD  albuterol (VENTOLIN HFA) 108 (90 Base) MCG/ACT inhaler Inhale 2 puffs into the lungs every 6 (six) hours as needed for wheezing or shortness of breath.    [provider]  aspirin EC 81 MG EC tablet Take 1 tablet (81 mg total) by mouth daily. 12/11/19 03/10/20  Hollice Gong, Mir Mohammed, MD  bisacodyl (DULCOLAX) 5 MG EC tablet Take 5 mg by mouth 2 (two) times a week.    [provider]  busPIRone (BUSPAR) 10 MG tablet Take 10 mg by mouth 2 (two) times daily.   11/03/18   [provider]  clopidogrel (PLAVIX) 75 MG tablet Take 1 tablet (75 mg total) by mouth daily. 08/14/19   Satira Sark, MD  DOK 100 MG capsule Take 100 mg by mouth daily. 11/19/19   [provider]  doxazosin (CARDURA) 1 MG tablet Take 1 mg by mouth daily. 11/19/19   [provider]  dutasteride (AVODART) 0.5 MG capsule Take 0.5 mg by mouth. In the afternoon    [provider]  escitalopram (LEXAPRO) 20 MG tablet Take 20 mg by mouth every morning.     [provider]  furosemide (LASIX) 20 MG tablet Take 20 mg by mouth 2 (two) times daily.  12/03/16   [provider]  gabapentin (NEURONTIN) 300 MG capsule Take 300 mg by mouth 2 (two) times daily.    [provider]  hydrOXYzine (ATARAX/VISTARIL) 25 MG tablet Take 25 mg by mouth 4 (four) times daily as needed for anxiety.  11/19/19   [provider]  isosorbide mononitrate (IMDUR) 30 MG 24 hr tablet Take 3 tablets (90 mg  total) by mouth daily. 01/15/20   Montine Circle, PA-C  levothyroxine (SYNTHROID, LEVOTHROID) 50 MCG tablet Take 50 mcg by mouth every morning.  10/04/16   [provider]  memantine (NAMENDA) 10 MG tablet Take 10 mg by mouth 2 (two) times daily.  03/05/16   [provider]  nitroGLYCERIN (NITROSTAT) 0.4 MG SL tablet Place 1 tablet (0.4 mg total) under the tongue every 5 (five) minutes as needed for chest pain. PLACE ONE TABLET UNDER TONGUE EVERY 5 MIN UP TO 3 DOSES AS NEEDED FORCHEST PAIN. 12/17/19   Barrett, Evelene Croon, PA-C  oxycodone (ROXICODONE) 30 MG immediate release tablet Take 30 mg by mouth every 4 (four) hours as needed for pain. Take 1 tablet by mouth every 4 to 6 hours as needed for pain.  Max 8 /24 hours. 12/09/16   [provider]  pantoprazole (PROTONIX) 40 MG tablet Take 1 tablet (40 mg total) by mouth every morning. 12/08/19   Enzo Bi, MD  pilocarpine (SALAGEN) 5 MG tablet Take 5 mg by mouth 2 (two) times  daily.    [provider]  potassium chloride SA (K-DUR,KLOR-CON) 20 MEQ tablet Take 20 mEq by mouth daily.     [provider]  QUEtiapine (SEROQUEL) 25 MG tablet Take 25 mg by mouth at bedtime. 11/19/19   [provider]  rosuvastatin (CRESTOR) 40 MG tablet Take 1 tablet (40 mg total) by mouth daily at 6 PM. 12/11/19 01/10/20  Hollice Gong, Mir Earlie Server, MD  tamsulosin (FLOMAX) 0.4 MG CAPS capsule Take 1 capsule (0.4 mg total) by mouth daily. To help you pee. 12/08/19   Enzo Bi, MD    Physical Exam: BP (!) 114/58   Pulse 62   Temp 98.2 F (36.8 C) (Oral)   Resp 16   Wt 95.3 kg   SpO2 97%   BMI 31.01 kg/m   . General: 86 y.o. year-old male well developed well nourished in no acute distress.  Alert and oriented x3. [name] HEENT: Normocephalic, atraumatic . Neck: Supple, trachea medial . Cardiovascular: Regular rate and rhythm with no rubs or gallops.  No thyromegaly or JVD noted.  No lower extremity edema. 2/4 pulses in all 4 extremities. [name] Respiratory: Bilateral crackles auscultated in lower lobes.  No accessory muscle use .  Abdomen: Soft nontender nondistended with normal bowel sounds x4 quadrants. . Muskuloskeletal: No cyanosis, clubbing or edema noted bilaterally . Neuro: CN II-XII intact, strength, sensation, reflexes . Skin: No ulcerative lesions noted or rashes . Psychiatry: Judgement and insight appear normal. Mood is appropriate for condition and setting          Labs on Admission:  Basic Metabolic Panel: Recent Labs  Lab 02/13/20 0033  NA 132*  K 3.4*  CL 96*  CO2 26  GLUCOSE 147*  BUN 13  CREATININE 1.09  CALCIUM 8.2*   Liver Function Tests: Recent Labs  Lab 02/13/20 0033  AST 54*  ALT 33  ALKPHOS 38  BILITOT 0.9  PROT 6.5  ALBUMIN 3.3*   No results for input(s): LIPASE, AMYLASE in the last 168 hours. No results for input(s): AMMONIA in the last 168 hours. CBC: Recent Labs  Lab 02/13/20 0033  WBC 6.8  NEUTROABS 5.5  HGB  11.0*  HCT 34.7*  MCV 91.8  PLT 228   Cardiac Enzymes: No results for input(s): CKTOTAL, CKMB, CKMBINDEX, TROPONINI in the last 168 hours.  BNP (last 3 results) Recent Labs    12/07/19 1557 02/13/20 0033  BNP 41.0  44.0    ProBNP (last 3 results) No results for input(s): PROBNP in the last 8760 hours.  CBG: No results for input(s): GLUCAP in the last 168 hours.  Radiological Exams on Admission: CT Angio Chest PE W/Cm &/Or Wo Cm  Result Date: 02/13/2020 CLINICAL DATA:  Shortness of breath, hypoxia, elevated D-dimer. EXAM: CT ANGIOGRAPHY CHEST WITH CONTRAST TECHNIQUE: Multidetector CT imaging of the chest was performed using the standard protocol during bolus administration of intravenous contrast. Multiplanar CT image reconstructions and MIPs were obtained to evaluate the vascular anatomy. CONTRAST:  167mL OMNIPAQUE IOHEXOL 350 MG/ML SOLN COMPARISON:  Radiograph earlier this day. Chest CT 02/12/2017 FINDINGS: Cardiovascular: There are no filling defects within the pulmonary arteries to suggest pulmonary embolus. Thoracic aortic atherosclerosis without dissection or acute aortic abnormality. Post CABG with calcification of native coronary arteries. Mild multi chamber cardiomegaly. No pericardial effusion. Mediastinum/Nodes: Mild bilateral hilar adenopathy, largest node in the right infrahilar region measures 12 mm. Few prominent mediastinal lymph nodes are all subcentimeter. No esophageal wall thickening. No thyroid nodule. Lungs/Pleura: Mild emphysema. Bilateral patchy ground-glass opacities within both lungs, slight basilar predominance. Trace right pleural thickening without significant effusion. Trachea and central mainstem bronchi are patent. No evidence of pulmonary edema. Upper Abdomen: No acute findings. Musculoskeletal: Post median sternotomy. Degenerative change in the spine. There are no acute or suspicious osseous abnormalities. Minimal chronic loss of height of T1 superior  endplate. Review of the MIP images confirms the above findings. IMPRESSION: 1. No pulmonary embolus. 2. Bilateral patchy ground-glass opacities within both lungs, slight basilar predominance. Findings are suspicious for COVID-19 pneumonia. Other atypical infectious or inflammatory etiologies are also considered. 3. Mild bilateral hilar adenopathy is likely reactive. Aortic Atherosclerosis (ICD10-I70.0) and Emphysema (ICD10-J43.9). Electronically Signed   By: Keith Rake M.D.   On: 02/13/2020 02:22   DG Chest Port 1 View  Result Date: 02/13/2020 CLINICAL DATA:  Shortness of breath and hypoxia EXAM: PORTABLE CHEST 1 VIEW COMPARISON:  None. FINDINGS: The heart size and mediastinal contours are within normal limits. Both lungs are clear. The visualized skeletal structures are unremarkable. Remote CABG IMPRESSION: No active disease. Electronically Signed   By: Ulyses Jarred M.D.   On: 02/13/2020 00:56    EKG: I independently viewed the EKG done and my findings are as followed: Normal sinus rhythm heart rate of 80bpm  Assessment/Plan Present on Admission: . Pneumonia due to COVID-19 virus . OSA (obstructive sleep apnea) . HYPERTENSION, BENIGN . Hypothyroidism . Hypokalemia  Principal Problem:   Pneumonia due to COVID-19 virus Active Problems:   OSA (obstructive sleep apnea)   HYPERTENSION, BENIGN   Hypothyroidism   Hypokalemia  Acute respiratory failure with hypoxia possibly due to pneumonia secondary to COVID-19 virus pneumonia with concomitant COPD exacerbation CT angiography chest with contrast ruled out pulmonary embolus but showed bilateral patchy ground glass opacity within both lungs, with slight basilar predominance and with findings suspicious for COVID-19 pneumonia.  Continue albuterol Q6H Continue IV Solu-Medrol Remdesivir per pharmacy protocol Continue vit. C 500 mg and Zn 220 mg daily Continue mucolytics and antitussives Continue Zithromax Continue to obtain daily COVID  inflammatory markers and adjust accordingly Continue supplemental oxygen via [place] to obtain SPO2 > or = 94%. Continue airborne isolation. Encourage proning as tolerated  Altered mental status possibly secondary to above There was no reported polypharmacy or any new medication Continue fall precaution, aspiration precaution, neurochecks  Hypokalemia K+ 3.4, this will be replenished  Hypertension Continue Lasix, Imdur and tamsulosin per home  regimen  Hypothyroidism Continue Synthroid  Hyperlipidemia Continue statin  GERD Continue Protonix  History of coronary artery disease, left bundle branch block Patient follows with Dr. Domenic Polite.  Multivessel CAD status post CABG and subsequent DES intervention to the SVG to PDA as of 2012.  Resume home Plavix, statins  Diastolic CHF-stable and compensated.   Last echo 10/2018 EF 55 to 60% Resume home Plavix, Imdur,  Alzheimer's, depression  Continue home memantine, Lexapro, Seroquel, buspirone  BPH Continue home finasteride and tamsulosin    DVT prophylaxis: Lovenox  Code Status: Full code  Family Communication: None at bedside  Disposition Plan: Home once clinically stable  Consults called: None  Admission status: Inpatient    Bernadette Hoit MD [name]  If 7PM-7AM, please contact night-coverage [email or web address]  02/13/2020, 6:26 AM

## 2020-02-13 NOTE — Progress Notes (Signed)
Patient ambulated to bathroom, patient refused to allow RN to stay within him despite being a high fall risk. Patient educated on reasoning for RN to stay, and potential for injury if left unaccompanied. Patient reiterated refusal. Patient refusing Primo external catheter, provided with urinal.

## 2020-02-13 NOTE — ED Notes (Signed)
Pt refusing to have RT PCR panel completed. Pt stated that he has had plenty of COVID tests done and he can't stand to have another one. MD Tomi Bamberger notified.

## 2020-02-13 NOTE — Progress Notes (Signed)
Patient seen and examined. Admitted after midnight secondary to acute resp failure with hypoxia and chest images suggesting atypical infection. Patient is positive for COVID-19. Currently afebrile, still SOB and requiring oxygen supplementation. No nausea, no vomiting and no CP. Please refer to H&P written by Dr. Josephine Cables for further info/details on admission.  Plan: -continue remdesivir and steroids -continue oxygen supplementation, Vit C, Zinc and supportive care -will continue pronning position as tolerated and the use of flutter valve -continue PRN mucolytics and bronchodilators  Barton Dubois MD [phone]

## 2020-02-13 NOTE — ED Provider Notes (Signed)
[facility] EMERGENCY DEPARTMENT Provider Note   CSN: [id number] Arrival date & time: 02/12/20  2253   Time seen 1:00 AM  History Chief Complaint  Patient presents with  . Shortness of Breath   Level 5 caveat for confusion  Dustin [name] is a 86 y.o. male.  HPI   Daughter-in-law states patient has been confused for the past 3 days.  She states he is talking to his parents who are dead for the last 3 days.  He was seen at [facility] on the 14th and his tests were normal at that time.  She states he used to be on home oxygen but the doctor took it away.  He had COPD but he does not smoke anymore.  She states she checked his oxygen today because he said he was short of breath and he was confused and his pulse ox was in the 70s.  He denies chest pain, she states he started coughing today, his home health nurse that his temperature was 99.9 today, he denies sore throat, rhinorrhea, nausea, vomiting, or diarrhea.  She states although he has a diagnosis of dementia he has never had confusion like this before.  She denies any exposure to Covid.  He lives with his wife.  PCP [name], MD   Past Medical History:  Diagnosis Date  . Alzheimer disease ([place])   . Anxiety   . Arthritis   . Atrophic gastritis    a. By EGD 02/2013.  . Carotid artery disease ([place])    a. mild-mod plaque, <50% stenosis bilat by duplex 2018.  [name] Coronary atherosclerosis of native coronary artery    a. Multivessel s/p CABG 1996. b. s/p DES x 2 SVG to PDA 8/12 with distal disease managed medically.  . DDD (degenerative disc disease)    Chronic back pain  . Enlarged prostate   . Essential hypertension, benign   . Hematuria   . Hypothyroidism   . LBBB (left bundle branch block)   . MI (myocardial infarction) ([place])   . Mixed hyperlipidemia   . OA (osteoarthritis)   . OSA (obstructive sleep apnea)   . Sinus bradycardia    a. Aricept and BB discontinued due to this.    Patient Active Problem List   Diagnosis  Date Noted  . COPD (chronic obstructive pulmonary disease) ([place]) 12/10/2019  . COPD exacerbation ([place]) 12/07/2019  . Unstable angina ([place]) 11/07/2018  . Fall 11/07/2018  . Gait instability 11/07/2018  . Leukocytosis 11/07/2018  . Depression 11/07/2018  . BPH (benign prostatic hyperplasia) 11/07/2018  . Chronic pain 11/07/2018  . Pressure injury of skin 02/15/2017  . Chronic diastolic CHF (congestive heart failure) ([place]) 12/24/2016  . Sinus bradycardia 12/23/2016  . Foot pain, bilateral 12/23/2016  . Hypokalemia 04/05/2016  . Acute respiratory failure ([place]) 04/03/2016  . Acute encephalopathy 04/03/2016  . Hypothyroidism 04/03/2016  . Aspiration pneumonia ([place]) 04/03/2016  . Dementia ([place]) 04/03/2016  . Acute respiratory failure with hypoxia ([place]) 04/03/2016  . Memory loss 04/20/2015  . Hypoxia 01/27/2015  . CAP (community acquired pneumonia) 01/27/2015  . Fever 08/30/2014  . Healthcare associated bacterial pneumonia 08/30/2014  . Toxic Metabolic encephalopathy 12/29/7251  . Sepsis ([place]) 08/30/2014  . Neck pain on right side 08/29/2014  . Neck pain 08/29/2014  . Left-sided weakness 06/28/2014  . Chest pain 06/27/2014  . Tubular adenoma of colon 03/05/2013  . Anorexia 03/05/2013  . Loss of weight 03/05/2013  . Chronic constipation 07/04/2012  . Bronchitis 08/16/2011  .  Hyperlipidemia 10/01/2009  . OSA (obstructive sleep apnea) 10/01/2009  . Alzheimer disease ([place]) 10/01/2009  . HYPERTENSION, BENIGN 10/01/2009  . Coronary artery disease 10/01/2009    Past Surgical History:  Procedure Laterality Date  . COLONOSCOPY  08/03/2004   Jenkins-numerous large diverticula in the descending, transverse, descending, and sigmoid colon. Otherwise normal exam.  . COLONOSCOPY  07/12/2012   RMR: External hemorrhoidal tag; multiple rectal and colonic polyps removed and/or treated as described above. Pancolonic diverticulosis. Bx-tubular adenomas, rectal hyperplastic polyp. next colonoscopy in  06/2015.  [name] COLONOSCOPY N/A 06/22/2016   Procedure: COLONOSCOPY;  Surgeon: Aviva Signs, MD;  Location: [facility] END[facility];  Service: Gastroenterology;  Laterality: N/A;  730  . CORONARY ANGIOPLASTY WITH STENT PLACEMENT    . CORONARY ARTERY BYPASS GRAFT  1996   LIMA to LAD, SVG to D2, SVG to PDA, SVG to OM1 and OM2  . CORONARY STENT INTERVENTION N/A 12/10/2019   Procedure: CORONARY STENT INTERVENTION;  Surgeon: Burnell Blanks, MD;  Location: [place] CV LAB;  Service: Cardiovascular;  Laterality: N/A;  . ESOPHAGOGASTRODUODENOSCOPY N/A 03/16/2013   Procedure: ESOPHAGOGASTRODUODENOSCOPY (EGD);  Surgeon: Daneil Dolin, MD;  Location: [facility] END[facility];  Service: Endoscopy;  Laterality: N/A;  12:00-moved to [place] notified pt  . ESOPHAGOGASTRODUODENOSCOPY N/A 03/06/2013   Procedure: ESOPHAGOGASTRODUODENOSCOPY (EGD);  Surgeon: Daneil Dolin, MD;  Location: [facility];  Service: Endoscopy;  Laterality: N/A;  . HERNIA REPAIR    . INTRAVASCULAR ULTRASOUND/IVUS N/A 12/10/2019   Procedure: Intravascular Ultrasound/IVUS;  Surgeon: Burnell Blanks, MD;  Location: [place] CV LAB;  Service: Cardiovascular;  Laterality: N/A;  . LEFT HEART CATH AND CORS/GRAFTS ANGIOGRAPHY N/A 12/10/2019   Procedure: LEFT HEART CATH AND CORS/GRAFTS ANGIOGRAPHY;  Surgeon: Burnell Blanks, MD;  Location: [place] CV LAB;  Service: Cardiovascular;  Laterality: N/A;       Family History  Problem Relation Age of Onset  . Heart disease Other   . Heart attack Mother   . Colon cancer Neg Hx     Social History   Tobacco Use  . Smoking status: Former Smoker    Packs/day: 2.00    Years: 40.00    Pack years: 80.00    Types: Cigarettes    Quit date: 12/27/1994    Years since quitting: 25.1  . Smokeless tobacco: Never Used  Substance Use Topics  . Alcohol use: No    Alcohol/week: 0.0 standard drinks  . Drug use: No  Lives at home Lives with spouse  Home Medications Prior to Admission  medications   Medication Sig Start Date End Date Taking? Authorizing Provider  albuterol (PROVENTIL) (2.5 MG/3ML) 0.083% nebulizer solution Take 3 mLs (2.5 mg total) by nebulization every 6 (six) hours as needed for wheezing or shortness of breath. Patient will also need the nebulizer machine with this prescription. 10/22/18   Nat Christen, MD  albuterol (VENTOLIN HFA) 108 (90 Base) MCG/ACT inhaler Inhale 2 puffs into the lungs every 6 (six) hours as needed for wheezing or shortness of breath.    [provider]  aspirin EC 81 MG EC tablet Take 1 tablet (81 mg total) by mouth daily. 12/11/19 03/10/20  Hollice Gong, Mir Mohammed, MD  bisacodyl (DULCOLAX) 5 MG EC tablet Take 5 mg by mouth 2 (two) times a week.    [provider]  busPIRone (BUSPAR) 10 MG tablet Take 10 mg by mouth 2 (two) times daily.  11/03/18   [provider]  clopidogrel (PLAVIX) 75  MG tablet Take 1 tablet (75 mg total) by mouth daily. 08/14/19   Satira Sark, MD  DOK 100 MG capsule Take 100 mg by mouth daily. 11/19/19   [provider]  doxazosin (CARDURA) 1 MG tablet Take 1 mg by mouth daily. 11/19/19   [provider]  dutasteride (AVODART) 0.5 MG capsule Take 0.5 mg by mouth. In the afternoon    [provider]  escitalopram (LEXAPRO) 20 MG tablet Take 20 mg by mouth every morning.     [provider]  furosemide (LASIX) 20 MG tablet Take 20 mg by mouth 2 (two) times daily.  12/03/16   [provider]  gabapentin (NEURONTIN) 300 MG capsule Take 300 mg by mouth 2 (two) times daily.    [provider]  hydrOXYzine (ATARAX/VISTARIL) 25 MG tablet Take 25 mg by mouth 4 (four) times daily as needed for anxiety.  11/19/19   [provider]  isosorbide mononitrate (IMDUR) 30 MG 24 hr tablet Take 3 tablets (90 mg total) by mouth daily. 01/15/20   Montine Circle, PA-C  levothyroxine (SYNTHROID, LEVOTHROID) 50 MCG tablet Take 50 mcg by mouth every  morning.  10/04/16   [provider]  memantine (NAMENDA) 10 MG tablet Take 10 mg by mouth 2 (two) times daily.  03/05/16   [provider]  nitroGLYCERIN (NITROSTAT) 0.4 MG SL tablet Place 1 tablet (0.4 mg total) under the tongue every 5 (five) minutes as needed for chest pain. PLACE ONE TABLET UNDER TONGUE EVERY 5 MIN UP TO 3 DOSES AS NEEDED FORCHEST PAIN. 12/17/19   Barrett, Evelene Croon, PA-C  oxycodone (ROXICODONE) 30 MG immediate release tablet Take 30 mg by mouth every 4 (four) hours as needed for pain. Take 1 tablet by mouth every 4 to 6 hours as needed for pain.  Max 8 /24 hours. 12/09/16   [provider]  pantoprazole (PROTONIX) 40 MG tablet Take 1 tablet (40 mg total) by mouth every morning. 12/08/19   Enzo Bi, MD  pilocarpine (SALAGEN) 5 MG tablet Take 5 mg by mouth 2 (two) times daily.    [provider]  potassium chloride SA (K-DUR,KLOR-CON) 20 MEQ tablet Take 20 mEq by mouth daily.     [provider]  QUEtiapine (SEROQUEL) 25 MG tablet Take 25 mg by mouth at bedtime. 11/19/19   [provider]  rosuvastatin (CRESTOR) 40 MG tablet Take 1 tablet (40 mg total) by mouth daily at 6 PM. 12/11/19 01/10/20  Hollice Gong, Mir Earlie Server, MD  tamsulosin (FLOMAX) 0.4 MG CAPS capsule Take 1 capsule (0.4 mg total) by mouth daily. To help you pee. 12/08/19   Enzo Bi, MD    Allergies    Patient has no known allergies.  Review of Systems   Review of Systems  All other systems reviewed and are negative.   Physical Exam Updated Vital Signs BP (!) 120/59   Pulse 64   Temp 98.2 F (36.8 C) (Oral)   Resp 19   SpO2 94%   Physical Exam Vitals and nursing note reviewed.  Constitutional:      General: He is not in acute distress.    Appearance: Normal appearance. He is well-developed. He is not ill-appearing or toxic-appearing.  HENT:     Head: Normocephalic and atraumatic.     Right Ear: External ear normal.     Left Ear: External ear  normal.     Nose: Nose normal. No mucosal edema or rhinorrhea.  Mouth/Throat:     Dentition: No dental abscesses.     Pharynx: No uvula swelling.  Eyes:     Conjunctiva/sclera: Conjunctivae normal.     Pupils: Pupils are equal, round, and reactive to light.  Cardiovascular:     Rate and Rhythm: Normal rate and regular rhythm.     Heart sounds: Normal heart sounds. No murmur. No friction rub. No gallop.   Pulmonary:     Effort: Pulmonary effort is normal. No respiratory distress.     Breath sounds: Examination of the right-lower field reveals rales. Examination of the left-lower field reveals rales. Rales present. No wheezing or rhonchi.  Chest:     Chest wall: No tenderness or crepitus.  Abdominal:     General: Bowel sounds are normal. There is no distension.     Palpations: Abdomen is soft.     Tenderness: There is no abdominal tenderness. There is no guarding or rebound.  Musculoskeletal:        General: No tenderness. Normal range of motion.     Cervical back: Full passive range of motion without pain, normal range of motion and neck supple.     Comments: Moves all extremities well.   Skin:    General: Skin is warm and dry.     Coloration: Skin is not pale.     Findings: No erythema or rash.  Neurological:     General: No focal deficit present.     Mental Status: He is alert and oriented to person, place, and time.     Cranial Nerves: No cranial nerve deficit.     Comments: Although his daughter-in-law states he has been confused and talking to people who are not there, he knows the month, year, and he knows he is at [facility].  Psychiatric:        Mood and Affect: Mood normal. Mood is not anxious.        Speech: Speech normal.        Behavior: Behavior normal.        Thought Content: Thought content normal.     ED Results / Procedures / Treatments   Labs (all labs ordered are listed, but only abnormal results are displayed) Results for orders placed or  performed during the hospital encounter of 02/12/20  Respiratory Panel by RT PCR (Flu A&B, Covid) - Nasopharyngeal Swab   Specimen: Nasopharyngeal Swab  Result Value Ref Range   SARS Coronavirus 2 by RT PCR POSITIVE (A) NEGATIVE   Influenza A by PCR NEGATIVE NEGATIVE   Influenza B by PCR NEGATIVE NEGATIVE  Comprehensive metabolic panel  Result Value Ref Range   Sodium 132 (L) 135 - 145 mmol/L   Potassium 3.4 (L) 3.5 - 5.1 mmol/L   Chloride 96 (L) 98 - 111 mmol/L   CO2 26 22 - 32 mmol/L   Glucose, Bld 147 (H) 70 - 99 mg/dL   BUN 13 8 - 23 mg/dL   Creatinine, Ser 1.09 0.61 - 1.24 mg/dL   Calcium 8.2 (L) 8.9 - 10.3 mg/dL   Total Protein 6.5 6.5 - 8.1 g/dL   Albumin 3.3 (L) 3.5 - 5.0 g/dL   AST 54 (H) 15 - 41 U/L   ALT 33 0 - 44 U/L   Alkaline Phosphatase 38 38 - 126 U/L   Total Bilirubin 0.9 0.3 - 1.2 mg/dL   GFR calc non Af Amer >60 >60 mL/min   GFR calc Af Amer >60 >60 mL/min   Anion gap  10 5 - 15  Brain natriuretic peptide  Result Value Ref Range   B Natriuretic Peptide 44.0 0.0 - 100.0 pg/mL  CBC with Differential  Result Value Ref Range   WBC 6.8 4.0 - 10.5 K/uL   RBC 3.78 (L) 4.22 - 5.81 MIL/uL   Hemoglobin 11.0 (L) 13.0 - 17.0 g/dL   HCT 34.7 (L) 39.0 - 52.0 %   MCV 91.8 80.0 - 100.0 fL   MCH 29.1 26.0 - 34.0 pg   MCHC 31.7 30.0 - 36.0 g/dL   RDW 13.2 11.5 - 15.5 %   Platelets 228 150 - 400 K/uL   nRBC 0.0 0.0 - 0.2 %   Neutrophils Relative % 82 %   Neutro Abs 5.5 1.7 - 7.7 K/uL   Lymphocytes Relative 10 %   Lymphs Abs 0.7 0.7 - 4.0 K/uL   Monocytes Relative 8 %   Monocytes Absolute 0.5 0.1 - 1.0 K/uL   Eosinophils Relative 0 %   Eosinophils Absolute 0.0 0.0 - 0.5 K/uL   Basophils Relative 0 %   Basophils Absolute 0.0 0.0 - 0.1 K/uL   Immature Granulocytes 0 %   Abs Immature Granulocytes 0.03 0.00 - 0.07 K/uL  D-dimer, quantitative  Result Value Ref Range   D-Dimer, Quant 1.47 (H) 0.00 - 0.50 ug/mL-FEU  Procalcitonin  Result Value Ref Range    Procalcitonin <0.10 ng/mL  Lactate dehydrogenase  Result Value Ref Range   LDH 274 (H) 98 - 192 U/L  Ferritin  Result Value Ref Range   Ferritin 328 24 - 336 ng/mL  Triglycerides  Result Value Ref Range   Triglycerides 83 <150 mg/dL  Fibrinogen  Result Value Ref Range   Fibrinogen 601 (H) 210 - 475 mg/dL  C-reactive protein  Result Value Ref Range   CRP 7.2 (H) <1.0 mg/dL  POC SARS Coronavirus 2 Ag-ED - Nasal Swab (BD Veritor Kit)  Result Value Ref Range   SARS Coronavirus 2 Ag NEGATIVE NEGATIVE  Troponin I (High Sensitivity)  Result Value Ref Range   Troponin I (High Sensitivity) 10 <18 ng/L  Troponin I (High Sensitivity)  Result Value Ref Range   Troponin I (High Sensitivity) 11 <18 ng/L   Laboratory interpretation all normal except mild anemia, mild hypokalemia, mild hyponatremia, mild anemia, very elevated D-dimer, + Covid    EKG EKG Interpretation  Date/Time:  Tuesday February 12 2020 23:56:47 EST Ventricular Rate:  80 PR Interval:    QRS Duration: 129 QT Interval:  360 QTC Calculation: 416 R Axis:   37 Text Interpretation: Sinus rhythm Left bundle branch block No significant change since last tracing 14 Jan 2020 Confirmed by Rolland Porter [phone]) on 02/13/2020 12:13:32 AM   Radiology CT Angio Chest PE W/Cm &/Or Wo Cm  Result Date: 02/13/2020 CLINICAL DATA:  Shortness of breath, hypoxia, elevated D-dimer. EXAM: CT ANGIOGRAPHY CHEST WITH CONTRAST TECHNIQUE: Multidetector CT imaging of the chest was performed using the standard protocol during bolus administration of intravenous contrast. Multiplanar CT image reconstructions and MIPs were obtained to evaluate the vascular anatomy. CONTRAST:  176m OMNIPAQUE IOHEXOL 350 MG/ML SOLN COMPARISON:  Radiograph earlier this day. Chest CT 02/12/2017 FINDINGS: Cardiovascular: There are no filling defects within the pulmonary arteries to suggest pulmonary embolus. Thoracic aortic atherosclerosis without dissection or acute aortic  abnormality. Post CABG with calcification of native coronary arteries. Mild multi chamber cardiomegaly. No pericardial effusion. Mediastinum/Nodes: Mild bilateral hilar adenopathy, largest node in the right infrahilar region measures 12 mm. Few prominent mediastinal lymph  nodes are all subcentimeter. No esophageal wall thickening. No thyroid nodule. Lungs/Pleura: Mild emphysema. Bilateral patchy ground-glass opacities within both lungs, slight basilar predominance. Trace right pleural thickening without significant effusion. Trachea and central mainstem bronchi are patent. No evidence of pulmonary edema. Upper Abdomen: No acute findings. Musculoskeletal: Post median sternotomy. Degenerative change in the spine. There are no acute or suspicious osseous abnormalities. Minimal chronic loss of height of T1 superior endplate. Review of the MIP images confirms the above findings. IMPRESSION: 1. No pulmonary embolus. 2. Bilateral patchy ground-glass opacities within both lungs, slight basilar predominance. Findings are suspicious for COVID-19 pneumonia. Other atypical infectious or inflammatory etiologies are also considered. 3. Mild bilateral hilar adenopathy is likely reactive. Aortic Atherosclerosis (ICD10-I70.0) and Emphysema (ICD10-J43.9). Electronically Signed   By: Keith Rake M.D.   On: 02/13/2020 02:22   DG Chest Port 1 View  Result Date: 02/13/2020 CLINICAL DATA:  Shortness of breath and hypoxia EXAM: PORTABLE CHEST 1 VIEW COMPARISON:  None. FINDINGS: The heart size and mediastinal contours are within normal limits. Both lungs are clear. The visualized skeletal structures are unremarkable. Remote CABG IMPRESSION: No active disease. Electronically Signed   By: Ulyses Jarred M.D.   On: 02/13/2020 00:56        Procedures .Critical Care Performed by: Rolland Porter, MD Authorized by: Rolland Porter, MD   Critical care provider statement:    Critical care time (minutes):  38   Critical care was  necessary to treat or prevent imminent or life-threatening deterioration of the following conditions:  Respiratory failure   Critical care was time spent personally by me on the following activities:  Discussions with consultants, examination of patient, obtaining history from patient or surrogate, ordering and review of laboratory studies, ordering and review of radiographic studies, pulse oximetry, re-evaluation of patient's condition and review of old charts   (including critical care time)  Medications Ordered in ED Medications  iohexol (OMNIPAQUE) 350 MG/ML injection 100 mL (100 mLs Intravenous Contrast Given 02/13/20 0203)    ED Course  I have reviewed the triage vital signs and the nursing notes.  Pertinent labs & imaging results that were available during my care of the patient were reviewed by me and considered in my medical decision making (see chart for details).    MDM Rules/Calculators/A&P                     Patient's pulse ox was 84% on arrival to the ED.  He was placed on oxygen and it improved to 92%.  Patient's chest x-ray does not show any acute abnormality, however I did hear rales at the bases.  I was suspecting that he would have pneumonia.  Patient's D-dimer is elevated even when corrected for age, CTA was ordered.  Patient's point-of-care Covid test is negative.  He has been refusing to have the second to your test done.  Nurse states she finally convinced him to have it done however he was not very cooperative and he actually swung at the nurse.  Patient CTA of the chest is consistent with Covid pneumonia.  The rest of the Covid lab work was ordered.  I obtained the report of the patient's head CT done on February 14 and there was a suspicious area in the right occipital lobe and MRI was recommended.  Per our radiology staff only the CT of the head was done that day.  MRI is not available at night, it can be done later today.  03:22 Dr Josephine Cables, hospitalist will  admit  Dustin [name] was evaluated in Emergency Department on 02/13/2020 for the symptoms described in the history of present illness. He was evaluated in the context of the global COVID-19 pandemic, which necessitated consideration that the patient might be at risk for infection with the SARS-CoV-2 virus that causes COVID-19. Institutional protocols and algorithms that pertain to the evaluation of patients at risk for COVID-19 are in a state of rapid change based on information released by regulatory bodies including the CDC and federal and state organizations. These policies and algorithms were followed during the patient's care in the ED.   Final Clinical Impression(s) / ED Diagnoses Final diagnoses:  Hypoxia  Abnormal CT of the head  Pneumonia due to COVID-19 virus    Rx / DC Orders  Plan admission   Rolland Porter, MD, Barbette Or, MD 02/13/20 [phone]

## 2020-02-13 NOTE — ED Notes (Signed)
RT panel obtained with extreme difficulty, unsure of how accurate sample result. Sample taken to lab for processing.

## 2020-02-13 NOTE — ED Notes (Signed)
Date and time results received: 02/13/20 0410 (use smartphrase ".now" to insert current time)  Test: covid  Critical Value: positive  Name of Provider Notified: Dr Tomi Bamberger  Orders Received? Or Actions Taken?: Actions Taken: no orders received

## 2020-02-13 NOTE — ED Notes (Signed)
Pt transported to CT at this time.

## 2020-02-13 NOTE — Progress Notes (Signed)
Pt alert with confusion this morning, less confusion noted throughout the day with frequent reorienting. C/o generalized chronic pain MD notified and medication prescribed with relief. Continue to use male external cath, skin remains intact in the pubis area, K+ low two 10meq bags admin via PIV, poor po intake, did eat the fruit, loading dose of Remdesivir given, no adverse reactions noted. Assist with ADLs, co-op and pleasant, will cont to monitor.

## 2020-02-13 NOTE — Plan of Care (Signed)

## 2020-02-13 NOTE — ED Notes (Signed)
RN unable to take report at this time 

## 2020-02-14 DIAGNOSIS — G4733 Obstructive sleep apnea (adult) (pediatric): Secondary | ICD-10-CM

## 2020-02-14 LAB — C-REACTIVE PROTEIN: CRP: 6.1 mg/dL — ABNORMAL HIGH (ref <1.0)

## 2020-02-14 LAB — CBC WITH DIFFERENTIAL/PLATELET
Abs Immature Granulocytes: 0.02 10*3/uL (ref 0.00–0.07)
Basophils Absolute: 0 10*3/uL (ref 0.0–0.1)
Basophils Relative: 0 %
Eosinophils Absolute: 0 10*3/uL (ref 0.0–0.5)
Eosinophils Relative: 0 %
HCT: 36.5 % — ABNORMAL LOW (ref 39.0–52.0)
Hemoglobin: 11.5 g/dL — ABNORMAL LOW (ref 13.0–17.0)
Immature Granulocytes: 0 %
Lymphocytes Relative: 14 %
Lymphs Abs: 0.8 10*3/uL (ref 0.7–4.0)
MCH: 29.3 pg (ref 26.0–34.0)
MCHC: 31.5 g/dL (ref 30.0–36.0)
MCV: 93.1 fL (ref 80.0–100.0)
Monocytes Absolute: 0.6 10*3/uL (ref 0.1–1.0)
Monocytes Relative: 11 %
Neutro Abs: 4.1 10*3/uL (ref 1.7–7.7)
Neutrophils Relative %: 75 %
Platelets: 259 10*3/uL (ref 150–400)
RBC: 3.92 MIL/uL — ABNORMAL LOW (ref 4.22–5.81)
RDW: 13.1 % (ref 11.5–15.5)
WBC: 5.5 10*3/uL (ref 4.0–10.5)
nRBC: 0 % (ref 0.0–0.2)

## 2020-02-14 LAB — COMPREHENSIVE METABOLIC PANEL
ALT: 39 U/L (ref 0–44)
AST: 55 U/L — ABNORMAL HIGH (ref 15–41)
Albumin: 3 g/dL — ABNORMAL LOW (ref 3.5–5.0)
Alkaline Phosphatase: 37 U/L — ABNORMAL LOW (ref 38–126)
Anion gap: 12 (ref 5–15)
BUN: 16 mg/dL (ref 8–23)
CO2: 27 mmol/L (ref 22–32)
Calcium: 9.1 mg/dL (ref 8.9–10.3)
Chloride: 100 mmol/L (ref 98–111)
Creatinine, Ser: 0.93 mg/dL (ref 0.61–1.24)
GFR calc Af Amer: >60 mL/min (ref >60)
GFR calc non Af Amer: >60 mL/min (ref >60)
Glucose, Bld: 138 mg/dL — ABNORMAL HIGH (ref 70–99)
Potassium: 4.6 mmol/L (ref 3.5–5.1)
Sodium: 139 mmol/L (ref 135–145)
Total Bilirubin: 0.6 mg/dL (ref 0.3–1.2)
Total Protein: 6.3 g/dL — ABNORMAL LOW (ref 6.5–8.1)

## 2020-02-14 LAB — D-DIMER, QUANTITATIVE: D-Dimer, Quant: 1.14 ug/mL-FEU — ABNORMAL HIGH (ref 0.00–0.50)

## 2020-02-14 LAB — PHOSPHORUS: Phosphorus: 2.7 mg/dL (ref 2.5–4.6)

## 2020-02-14 LAB — FERRITIN: Ferritin: 391 ng/mL — ABNORMAL HIGH (ref 24–336)

## 2020-02-14 LAB — MAGNESIUM: Magnesium: 2.3 mg/dL (ref 1.7–2.4)

## 2020-02-14 MED ORDER — ESCITALOPRAM OXALATE 10 MG PO TABS
20.0000 mg | ORAL_TABLET | Freq: Every morning | ORAL | Status: DC
  Administered 2020-02-14 – 2020-02-16 (×3): 20 mg via ORAL
  Filled 2020-02-14 (×3): qty 2

## 2020-02-14 MED ORDER — DUTASTERIDE 0.5 MG PO CAPS
0.5000 mg | ORAL_CAPSULE | ORAL | Status: DC
  Administered 2020-02-14 – 2020-02-15 (×2): 0.5 mg via ORAL
  Filled 2020-02-14 (×4): qty 1

## 2020-02-14 MED ORDER — ROSUVASTATIN CALCIUM 20 MG PO TABS
40.0000 mg | ORAL_TABLET | Freq: Every day | ORAL | Status: DC
  Administered 2020-02-14 – 2020-02-15 (×2): 40 mg via ORAL
  Filled 2020-02-14 (×2): qty 2

## 2020-02-14 MED ORDER — FUROSEMIDE 20 MG PO TABS
20.0000 mg | ORAL_TABLET | Freq: Two times a day (BID) | ORAL | Status: DC
  Administered 2020-02-14 – 2020-02-16 (×5): 20 mg via ORAL
  Filled 2020-02-14 (×6): qty 1

## 2020-02-14 MED ORDER — TAMSULOSIN HCL 0.4 MG PO CAPS
0.4000 mg | ORAL_CAPSULE | Freq: Every day | ORAL | Status: DC
  Administered 2020-02-14 – 2020-02-16 (×3): 0.4 mg via ORAL
  Filled 2020-02-14 (×3): qty 1

## 2020-02-14 MED ORDER — CLOPIDOGREL BISULFATE 75 MG PO TABS
75.0000 mg | ORAL_TABLET | Freq: Every day | ORAL | Status: DC
  Administered 2020-02-14 – 2020-02-16 (×3): 75 mg via ORAL
  Filled 2020-02-14 (×3): qty 1

## 2020-02-14 MED ORDER — ISOSORBIDE MONONITRATE ER 60 MG PO TB24
90.0000 mg | ORAL_TABLET | Freq: Every day | ORAL | Status: DC
  Administered 2020-02-14 – 2020-02-16 (×3): 90 mg via ORAL
  Filled 2020-02-14 (×3): qty 2

## 2020-02-14 MED ORDER — LEVOTHYROXINE SODIUM 50 MCG PO TABS
50.0000 ug | ORAL_TABLET | Freq: Every day | ORAL | Status: DC
  Administered 2020-02-14 – 2020-02-16 (×4): 50 ug via ORAL
  Filled 2020-02-14: qty 1
  Filled 2020-02-14: qty 2
  Filled 2020-02-14 (×2): qty 1

## 2020-02-14 MED ORDER — HYDROXYZINE HCL 25 MG PO TABS
25.0000 mg | ORAL_TABLET | ORAL | Status: DC | PRN
  Administered 2020-02-14 – 2020-02-16 (×7): 25 mg via ORAL
  Filled 2020-02-14 (×7): qty 1

## 2020-02-14 MED ORDER — NITROGLYCERIN 0.4 MG SL SUBL
0.4000 mg | SUBLINGUAL_TABLET | SUBLINGUAL | Status: DC | PRN
  Administered 2020-02-14: 0.4 mg via SUBLINGUAL
  Filled 2020-02-14: qty 1

## 2020-02-14 MED ORDER — QUETIAPINE FUMARATE 25 MG PO TABS
25.0000 mg | ORAL_TABLET | Freq: Every day | ORAL | Status: DC
  Administered 2020-02-14 – 2020-02-15 (×2): 25 mg via ORAL
  Filled 2020-02-14 (×2): qty 1

## 2020-02-14 MED ORDER — DOCUSATE SODIUM 100 MG PO CAPS
100.0000 mg | ORAL_CAPSULE | Freq: Two times a day (BID) | ORAL | Status: DC
  Administered 2020-02-14 – 2020-02-16 (×4): 100 mg via ORAL
  Filled 2020-02-14 (×4): qty 1

## 2020-02-14 MED ORDER — MEMANTINE HCL 10 MG PO TABS
10.0000 mg | ORAL_TABLET | Freq: Two times a day (BID) | ORAL | Status: DC
  Administered 2020-02-14 – 2020-02-16 (×6): 10 mg via ORAL
  Filled 2020-02-14 (×6): qty 1

## 2020-02-14 MED ORDER — PANTOPRAZOLE SODIUM 40 MG PO TBEC
40.0000 mg | DELAYED_RELEASE_TABLET | Freq: Every morning | ORAL | Status: DC
  Administered 2020-02-14 – 2020-02-16 (×3): 40 mg via ORAL
  Filled 2020-02-14 (×3): qty 1

## 2020-02-14 MED ORDER — BUSPIRONE HCL 5 MG PO TABS
10.0000 mg | ORAL_TABLET | Freq: Two times a day (BID) | ORAL | Status: DC
  Administered 2020-02-14 – 2020-02-16 (×6): 10 mg via ORAL
  Filled 2020-02-14 (×6): qty 2

## 2020-02-14 MED ORDER — MAGNESIUM CITRATE PO SOLN
1.0000 | Freq: Once | ORAL | Status: DC
  Filled 2020-02-14: qty 296

## 2020-02-14 NOTE — Plan of Care (Signed)

## 2020-02-14 NOTE — Progress Notes (Signed)
Patient standing at door with door open.  Patient upset.  Patient threatening to leave. Patient irate saying staff that has not been in room, and no one has "waited" on him today.Explained to patient that staff had other patients to care for, it was shift change,  and would be in to see him shortly.  Patient states that he will just leave if no one will care for him.  Staff has been in patients room multiple times through out shift during his stay.  Instructed patient he could not leave room due to his covid diagnosis, and that his nurse would be in room to see him soon.

## 2020-02-14 NOTE — Progress Notes (Signed)
Patient assisted to bathroom. Patient would NOT allow me to stay present or even within eye sight while he used to bathroom.  Education provided. Still refusing for me to stay.

## 2020-02-14 NOTE — Progress Notes (Signed)
Patient is overall tired today. He has eaten 75% of his breakfast and lunch today. He has used the walker to ambulate to the bathroom. He has also ambulated in the room several times with minimal assistance.

## 2020-02-14 NOTE — Progress Notes (Signed)
PROGRESS NOTE    Dustin [name]  [id number] DOB: 09-Jan-1934 DOA: 02/12/2020 PCP: [name], MD     Brief Narrative:  As per H&P written by Dr. Josephine Cables on 02/13/2020 86 y.o. male with medical history significant for COPD, hypertension, diastolic CHF, BPH, left bundle branch block, Alzheimer's, obstructive sleep apnea and coronary artery disease who presents to the emergency department due to 3-day onset of worsening confusion.  Patient was unable to provide history on why he came to the ED.  History was obtained from ED physician and ED chart.  Per report, patient was reported to be talking to his dead parents since last 3 days.  Patient was recently seen at [facility] on the 14th with normal tests at that time.  Patient used to be on home oxygen but this was stopped by his PCP.  Patient' complaint of shortness of breath, so pulse ox was checked and it was in the 70s.  Due to patient's confusion and low level of oxygen, EMS was activated and patient was taken to the ED for further evaluation and management.  There was no report of fever, nausea, vomiting, sore throat, abdominal pain or diarrhea.  ED Course:  In the emergency department, he was hypoxic with an O2 sat of chest with contrast %.  Other vital signs are within normal range.  Work-up in the ED showed normocytic anemia, hyponatremia, hypokalemia, elevated D-dimer at 1.47, CRP 7.2, LDH 274,AST 54. SARS coronavirus was positive.  CT angiography chest with contrast ruled out pulmonary embolus but showed bilateral patchy groundglass opacity within both lungs, with slight basilar predominance and with findings suspicious for COVID-19 pneumonia.  Chest x-ray showed no active disease. Breathing treatment was provided and hospitalist was asked to admit patient for further evaluation and management.  Assessment & Plan: 1-acute respiratory failure with hypoxia secondary to COVID-19 -Good oxygen saturation on 2-3 L nasal cannula  supplementation  -Still feeling short of breath with minimal exertion, complaining of intermittent coughing spells and generalized fatigue. -No fever -Stable inflammatory markers, and appropriately trending down. -Day #2 of remdesivir and steroids. -Continue as needed bronchodilators and the use of flutter valve. -Low procalcitonin level, normal WBCs and no fever; presentation secondary to viral infection will discontinue antibiotics.  2-OSA (obstructive sleep apnea) -No CPAP secondary to acute hospitalization as per COVID-19 protocol. -Continue oxygen supplementation especially nighttime to prevent hypoxia during sleep.  3-hypertension -Overall stable -Continue current antihypertensive regimen.  4-hypothyroidism -Continue the use of Synthroid.  5-GERD -Continue PPI  6-history of coronary artery disease -Status post CABG -Patient denies chest pain -Continue Plavix and statins -No acute ischemic changes appreciated on EKG or telemetry.  7-chronic diastolic heart failure -Last ejection fraction 55 to 60% -Follow daily weights and strict I's and O's  8-history of BPH -Continue finasteride and Flomax.  9-Alzheimer's disease with depression/anxiety -Stable mood -Continue memantine, Lexapro Seroquel and buspirone    10-essential hypertension -Stable -Continue current antihypertensive regimen.  11-chronic pain syndrome -Will resume pain analgesic regimen -Follow response and adjust dosages as needed.  DVT prophylaxis: Lovenox Code Status: Full code Family Communication: No family at bedside. Disposition Plan: Remains inpatient, continue IV steroids and remdesivir; day 2/5 of infusion.  Responding appropriately.  Consultants:   none  Procedures:   See below for x-ray reports.  Antimicrobials:  Anti-infectives (From admission, onward)   Start     Dose/Rate Route Frequency Ordered Stop   02/14/20 1000  remdesivir 100 mg in sodium chloride 0.9 % 100  mL IVPB      100 mg 200 mL/hr over 30 Minutes Intravenous Daily 02/13/20 0551 02/18/20 0959   02/14/20 1000  azithromycin (ZITHROMAX) tablet 250 mg     250 mg Oral Daily 02/13/20 0632 02/18/20 0959   02/13/20 1000  azithromycin (ZITHROMAX) tablet 500 mg     500 mg Oral Daily 02/13/20 0632 02/13/20 1214   02/13/20 0700  remdesivir 200 mg in sodium chloride 0.9% 250 mL IVPB     200 mg 580 mL/hr over 30 Minutes Intravenous Once 02/13/20 0551 02/13/20 1114      Subjective: Afebrile, no chest pain, no nausea or vomiting.  Feeling generally weak and fatigue, requiring 2-3 L nasal cannula supplementation and expressing shortness of breath on exertion.  Objective: Vitals:   02/14/20 0823 02/14/20 0833 02/14/20 1551 02/14/20 1630  BP:  (!) 132/58 115/67   Pulse:  60 67   Resp:   14   Temp:      TempSrc:      SpO2: 98% 96% 93% 94%  Weight:        Intake/Output Summary (Last 24 hours) at 02/14/2020 1839 Last data filed at 02/14/2020 1531 Gross per 24 hour  Intake 400 ml  Output 1 ml  Net 399 ml   Filed Weights   02/13/20 0451 02/14/20 0311  Weight: 95.3 kg 88.9 kg    Examination: General exam: Alert, awake, oriented x 3; still short of breath on minimal exertion and requiring 2-3 L nasal cannula supplementation.  No chest pain, no nausea or vomiting.  Patient is afebrile. Respiratory system: No using accessory muscles, mild tachypnea reported with physical activity; positive rhonchi bilaterally on auscultation.   Cardiovascular system:RRR. No murmurs, rubs, gallops. Gastrointestinal system: Abdomen is nondistended, soft and nontender. No organomegaly or masses felt. Normal bowel sounds heard. Central nervous system: Alert and oriented. No focal neurological deficits. Extremities: No C/C/E, +pedal pulses Skin: No rashes, lesions or ulcers Psychiatry: Judgement and insight appear normal. Mood & affect appropriate.     Data Reviewed: I have personally reviewed following labs and imaging  studies  CBC: Recent Labs  Lab 02/13/20 0033 02/14/20 0457  WBC 6.8 5.5  NEUTROABS 5.5 4.1  HGB 11.0* 11.5*  HCT 34.7* 36.5*  MCV 91.8 93.1  PLT 228 [id number]   Basic Metabolic Panel: Recent Labs  Lab 02/13/20 0033 02/14/20 0457  NA 132* 139  K 3.4* 4.6  CL 96* 100  CO2 26 27  GLUCOSE 147* 138*  BUN 13 16  CREATININE 1.09 0.93  CALCIUM 8.2* 9.1  MG  --  2.3  PHOS  --  2.7   GFR: Estimated Creatinine Clearance: 66.4 mL/min (by C-G formula based on SCr of 0.93 mg/dL).   Liver Function Tests: Recent Labs  Lab 02/13/20 0033 02/14/20 0457  AST 54* 55*  ALT 33 39  ALKPHOS 38 37*  BILITOT 0.9 0.6  PROT 6.5 6.3*  ALBUMIN 3.3* 3.0*   Lipid Profile: Recent Labs    02/13/20 0229  TRIG 83   Anemia Panel: Recent Labs    02/13/20 0033 02/14/20 0457  FERRITIN 328 391*   Urine analysis:    Component Value Date/Time   COLORURINE YELLOW 12/07/2019 1931   APPEARANCEUR CLEAR 12/07/2019 1931   LABSPEC 1.012 12/07/2019 1931   PHURINE 5.0 12/07/2019 1931   GLUCOSEU NEGATIVE 12/07/2019 1931   HGBUR NEGATIVE 12/07/2019 1931   BILIRUBINUR NEGATIVE 12/07/2019 [place] NEGATIVE 12/07/2019 [place] NEGATIVE 12/07/2019 1931  UROBILINOGEN 0.2 01/26/2015 2247   NITRITE NEGATIVE 12/07/2019 1931   LEUKOCYTESUR NEGATIVE 12/07/2019 1931    Recent Results (from the past 240 hour(s))  Culture, blood (routine x 2)     Status: None (Preliminary result)   Collection Time: 02/13/20 12:33 AM   Specimen: BLOOD  Result Value Ref Range Status   Specimen Description BLOOD RIGHT ANTECUBITAL  Final   Special Requests   Final    BOTTLES DRAWN AEROBIC AND ANAEROBIC Blood Culture results may not be optimal due to an excessive volume of blood received in culture bottles   Culture   Final    NO GROWTH 1 DAY Performed at [facility], [address], [place], [place] [address]    Report Status PENDING  Incomplete  Culture, blood (routine x 2)     Status: None (Preliminary  result)   Collection Time: 02/13/20 12:42 AM   Specimen: BLOOD  Result Value Ref Range Status   Specimen Description BLOOD RIGHT ANTECUBITAL  Final   Special Requests   Final    BOTTLES DRAWN AEROBIC AND ANAEROBIC Blood Culture adequate volume   Culture   Final    NO GROWTH 1 DAY Performed at [facility], [address] [place], [place] [address]    Report Status PENDING  Incomplete  Respiratory Panel by RT PCR (Flu A&B, Covid) - Nasopharyngeal Swab     Status: Abnormal   Collection Time: 02/13/20  2:30 AM   Specimen: Nasopharyngeal Swab  Result Value Ref Range Status   SARS Coronavirus 2 by RT PCR POSITIVE (A) NEGATIVE Final    Comment: RESULT CALLED TO, READ BACK BY AND VERIFIED WITH: K BELTON,RN @0409  02/13/20 MKELLY (NOTE) SARS-CoV-2 target nucleic acids are DETECTED. SARS-CoV-2 RNA is generally detectable in upper respiratory specimens  during the acute phase of infection. Positive results are indicative of the presence of the identified virus, but do not rule out bacterial infection or co-infection with other pathogens not detected by the test. Clinical correlation with patient history and other diagnostic information is necessary to determine patient infection status. The expected result is Negative. Fact Sheet for Patients:  PinkCheek.be Fact Sheet for Healthcare Providers: GravelBags.it This test is not yet approved or cleared by the [place] FDA and  has been authorized for detection and/or diagnosis of SARS-CoV-2 by FDA under an Emergency Use Authorization (EUA).  This EUA will remain in effect (meaning this test can be used) for  the duration of  the COVID-19 declaration under Section 564(b)(1) of the Act, 21 U.S.C. section 360bbb-3(b)(1), unless the authorization is terminated or revoked sooner.    Influenza A by PCR NEGATIVE NEGATIVE Final   Influenza B by PCR NEGATIVE NEGATIVE Final    Comment:  (NOTE) The Xpert Xpress SARS-CoV-2/FLU/RSV assay is intended as an aid in  the diagnosis of influenza from Nasopharyngeal swab specimens and  should not be used as a sole basis for treatment. Nasal washings and  aspirates are unacceptable for Xpert Xpress SARS-CoV-2/FLU/RSV  testing. Fact Sheet for Patients: PinkCheek.be Fact Sheet for Healthcare Providers: GravelBags.it This test is not yet approved or cleared by the [place] FDA and  has been authorized for detection and/or diagnosis of SARS-CoV-2 by  FDA under an Emergency Use Authorization (EUA). This EUA will remain  in effect (meaning this test can be used) for the duration of the  Covid-19 declaration under Section 564(b)(1) of the Act, 21  U.S.C. section 360bbb-3(b)(1), unless the authorization is  terminated or revoked.  Performed at [facility], [address] [place], [place] [address]      Radiology Studies: CT Angio Chest PE W/Cm &/Or Wo Cm  Result Date: 02/13/2020 CLINICAL DATA:  Shortness of breath, hypoxia, elevated D-dimer. EXAM: CT ANGIOGRAPHY CHEST WITH CONTRAST TECHNIQUE: Multidetector CT imaging of the chest was performed using the standard protocol during bolus administration of intravenous contrast. Multiplanar CT image reconstructions and MIPs were obtained to evaluate the vascular anatomy. CONTRAST:  166mL OMNIPAQUE IOHEXOL 350 MG/ML SOLN COMPARISON:  Radiograph earlier this day. Chest CT 02/12/2017 FINDINGS: Cardiovascular: There are no filling defects within the pulmonary arteries to suggest pulmonary embolus. Thoracic aortic atherosclerosis without dissection or acute aortic abnormality. Post CABG with calcification of native coronary arteries. Mild multi chamber cardiomegaly. No pericardial effusion. Mediastinum/Nodes: Mild bilateral hilar adenopathy, largest node in the right infrahilar region measures 12 mm. Few prominent mediastinal lymph nodes are  all subcentimeter. No esophageal wall thickening. No thyroid nodule. Lungs/Pleura: Mild emphysema. Bilateral patchy ground-glass opacities within both lungs, slight basilar predominance. Trace right pleural thickening without significant effusion. Trachea and central mainstem bronchi are patent. No evidence of pulmonary edema. Upper Abdomen: No acute findings. Musculoskeletal: Post median sternotomy. Degenerative change in the spine. There are no acute or suspicious osseous abnormalities. Minimal chronic loss of height of T1 superior endplate. Review of the MIP images confirms the above findings. IMPRESSION: 1. No pulmonary embolus. 2. Bilateral patchy ground-glass opacities within both lungs, slight basilar predominance. Findings are suspicious for COVID-19 pneumonia. Other atypical infectious or inflammatory etiologies are also considered. 3. Mild bilateral hilar adenopathy is likely reactive. Aortic Atherosclerosis (ICD10-I70.0) and Emphysema (ICD10-J43.9). Electronically Signed   By: Keith Rake M.D.   On: 02/13/2020 02:22   DG Chest Port 1 View  Result Date: 02/13/2020 CLINICAL DATA:  Shortness of breath and hypoxia EXAM: PORTABLE CHEST 1 VIEW COMPARISON:  None. FINDINGS: The heart size and mediastinal contours are within normal limits. Both lungs are clear. The visualized skeletal structures are unremarkable. Remote CABG IMPRESSION: No active disease. Electronically Signed   By: Ulyses Jarred M.D.   On: 02/13/2020 00:56    Scheduled Meds: . albuterol  2 puff Inhalation TID  . ascorbic acid  500 mg Oral Daily  . azithromycin  250 mg Oral Daily  . busPIRone  10 mg Oral BID  . clopidogrel  75 mg Oral Daily  . dexamethasone (DECADRON) injection  6 mg Intravenous Q24H  . docusate sodium  100 mg Oral BID  . dutasteride  0.5 mg Oral Q24H  . enoxaparin (LOVENOX) injection  40 mg Subcutaneous Q24H  . escitalopram  20 mg Oral q morning - [id number]  . folic acid  1 mg Oral Daily  . furosemide  20 mg  Oral BID  . isosorbide mononitrate  90 mg Oral Daily  . levothyroxine  50 mcg Oral QAC breakfast  . magnesium citrate  1 Bottle Oral Once  . memantine  10 mg Oral BID  . multivitamin with minerals  1 tablet Oral Daily  . pantoprazole  40 mg Oral q morning - 10a  . QUEtiapine  25 mg Oral QHS  . rosuvastatin  40 mg Oral q1800  . tamsulosin  0.4 mg Oral Daily  . zinc sulfate  220 mg Oral Daily   Continuous Infusions: . remdesivir 100 mg in NS 100 mL 100 mg (02/14/20 1112)     LOS: 1 day    Time spent: 30 minutes.    Barton Dubois, MD [name] Pager [phone]   02/14/2020, 6:39 PM

## 2020-02-14 NOTE — Progress Notes (Signed)
Notified ambassador of pt's new dinner request. Dustin [name] has new order

## 2020-02-15 LAB — COMPREHENSIVE METABOLIC PANEL
ALT: 33 U/L (ref 0–44)
AST: 32 U/L (ref 15–41)
Albumin: 2.8 g/dL — ABNORMAL LOW (ref 3.5–5.0)
Alkaline Phosphatase: 33 U/L — ABNORMAL LOW (ref 38–126)
Anion gap: 11 (ref 5–15)
BUN: 21 mg/dL (ref 8–23)
CO2: 27 mmol/L (ref 22–32)
Calcium: 8.9 mg/dL (ref 8.9–10.3)
Chloride: 101 mmol/L (ref 98–111)
Creatinine, Ser: 1 mg/dL (ref 0.61–1.24)
GFR calc Af Amer: >60 mL/min (ref >60)
GFR calc non Af Amer: >60 mL/min (ref >60)
Glucose, Bld: 131 mg/dL — ABNORMAL HIGH (ref 70–99)
Potassium: 3.7 mmol/L (ref 3.5–5.1)
Sodium: 139 mmol/L (ref 135–145)
Total Bilirubin: 0.6 mg/dL (ref 0.3–1.2)
Total Protein: 5.8 g/dL — ABNORMAL LOW (ref 6.5–8.1)

## 2020-02-15 LAB — CBC WITH DIFFERENTIAL/PLATELET
Abs Immature Granulocytes: 0.03 10*3/uL (ref 0.00–0.07)
Basophils Absolute: 0 10*3/uL (ref 0.0–0.1)
Basophils Relative: 0 %
Eosinophils Absolute: 0 10*3/uL (ref 0.0–0.5)
Eosinophils Relative: 0 %
HCT: 34.1 % — ABNORMAL LOW (ref 39.0–52.0)
Hemoglobin: 10.6 g/dL — ABNORMAL LOW (ref 13.0–17.0)
Immature Granulocytes: 0 %
Lymphocytes Relative: 13 %
Lymphs Abs: 1 10*3/uL (ref 0.7–4.0)
MCH: 28.8 pg (ref 26.0–34.0)
MCHC: 31.1 g/dL (ref 30.0–36.0)
MCV: 92.7 fL (ref 80.0–100.0)
Monocytes Absolute: 0.5 10*3/uL (ref 0.1–1.0)
Monocytes Relative: 6 %
Neutro Abs: 6.2 10*3/uL (ref 1.7–7.7)
Neutrophils Relative %: 81 %
Platelets: 307 10*3/uL (ref 150–400)
RBC: 3.68 MIL/uL — ABNORMAL LOW (ref 4.22–5.81)
RDW: 13 % (ref 11.5–15.5)
WBC: 7.8 10*3/uL (ref 4.0–10.5)
nRBC: 0 % (ref 0.0–0.2)

## 2020-02-15 LAB — GLUCOSE, CAPILLARY: Glucose-Capillary: 118 mg/dL — ABNORMAL HIGH (ref 70–99)

## 2020-02-15 LAB — C-REACTIVE PROTEIN: CRP: 2.4 mg/dL — ABNORMAL HIGH (ref <1.0)

## 2020-02-15 LAB — PHOSPHORUS: Phosphorus: 2.7 mg/dL (ref 2.5–4.6)

## 2020-02-15 LAB — MAGNESIUM: Magnesium: 2.2 mg/dL (ref 1.7–2.4)

## 2020-02-15 LAB — D-DIMER, QUANTITATIVE: D-Dimer, Quant: 0.65 ug/mL-FEU — ABNORMAL HIGH (ref 0.00–0.50)

## 2020-02-15 LAB — FERRITIN: Ferritin: 341 ng/mL — ABNORMAL HIGH (ref 24–336)

## 2020-02-15 MED ORDER — OXYCODONE HCL 5 MG PO TABS
20.0000 mg | ORAL_TABLET | Freq: Four times a day (QID) | ORAL | Status: DC | PRN
  Administered 2020-02-15 – 2020-02-16 (×4): 20 mg via ORAL
  Filled 2020-02-15 (×4): qty 4

## 2020-02-15 NOTE — Progress Notes (Signed)
Bedside report rec'd, assumed care. Pt presents AAOx4, pleasant and cooperative. Denies c/o at this time. VSS. Will con't to monitor.

## 2020-02-15 NOTE — TOC Initial Note (Signed)
Transition of Care [facility]) - Initial/Assessment Note    Patient Details  Name: Dustin [name] MRN: [id number] Date of Birth: [birth year]  [facility] CM/SW Contact:    Boneta Lucks, RN Phone Number: 02/15/2020, 3:50 PM  Clinical Narrative:  Patient admitted with pneumonia due to [place], Patient also a high risk for readmission. Patient lives at home with wife. CM spoke with Levada Dy - daugter. Discharge planning for patient to go home tomorrow. Patient will need last does of Remdesivir, that has been scheduled at 10 am at [facility], added to AVS. Also transportation scheduled, as family states they can not transport.  Levada Dy updated. TOC following for need of home oxygen.   Expected Discharge Plan: Home/Self Care Barriers to Discharge: Continued Medical Work up   Patient Goals and CMS Choice Patient states their goals for this hospitalization and ongoing recovery are:: to go home. CMS Medicare.gov Compare Post Acute Care list provided to:: Patient Represenative (must comment) Choice offered to / list presented to : Adult Children  Expected Discharge Plan and Services Expected Discharge Plan: Home/Self Care       Living arrangements for the past 2 months: Single Family Home     Prior Living Arrangements/Services Living arrangements for the past 2 months: Single Family Home Lives with:: Spouse          Need for Family Participation in Patient Care: Yes (Comment) Care giver support system in place?: Yes (comment) Current home services: DME Criminal Activity/Legal Involvement Pertinent to Current Situation/Hospitalization: No - Comment as needed  Activities of Daily Living      Permission Sought/Granted      Share Information with NAME: Levada Dy     Permission granted to share info w Relationship: Daughter     Emotional Assessment      Alcohol / Substance Use: Not Applicable Psych Involvement: No (comment)  Admission diagnosis:  Hypoxia  [R09.02] Abnormal CT of the head [R93.0] Pneumonia due to COVID-19 virus [U07.1, J12.82] Patient Active Problem List   Diagnosis Date Noted  . Pneumonia due to COVID-19 virus 02/13/2020  . COPD (chronic obstructive pulmonary disease) ([place]) 12/10/2019  . COPD exacerbation ([place]) 12/07/2019  . Unstable angina ([place]) 11/07/2018  . Fall 11/07/2018  . Gait instability 11/07/2018  . Leukocytosis 11/07/2018  . Depression 11/07/2018  . BPH (benign prostatic hyperplasia) 11/07/2018  . Chronic pain 11/07/2018  . Pressure injury of skin 02/15/2017  . Chronic diastolic CHF (congestive heart failure) ([place]) 12/24/2016  . Sinus bradycardia 12/23/2016  . Foot pain, bilateral 12/23/2016  . Hypokalemia 04/05/2016  . Acute respiratory failure ([place]) 04/03/2016  . Acute encephalopathy 04/03/2016  . Hypothyroidism 04/03/2016  . Aspiration pneumonia ([place]) 04/03/2016  . Dementia ([place]) 04/03/2016  . Acute respiratory failure with hypoxia ([place]) 04/03/2016  . Memory loss 04/20/2015  . Hypoxia 01/27/2015  . CAP (community acquired pneumonia) 01/27/2015  . Fever 08/30/2014  . Healthcare associated bacterial pneumonia 08/30/2014  . Toxic Metabolic encephalopathy [id number]  . Sepsis ([place]) 08/30/2014  . Neck pain on right side 08/29/2014  . Neck pain 08/29/2014  . Left-sided weakness 06/28/2014  . Chest pain 06/27/2014  . Tubular adenoma of colon 03/05/2013  . Anorexia 03/05/2013  . Loss of weight 03/05/2013  . Chronic constipation 07/04/2012  . Bronchitis 08/16/2011  . Hyperlipidemia 10/01/2009  . OSA (obstructive sleep apnea) 10/01/2009  . Alzheimer disease ([place]) 10/01/2009  . HYPERTENSION, BENIGN 10/01/2009  . Coronary artery disease 10/01/2009   PCP:  [name], MD Pharmacy:  [facility], [place] [address] Phone: [phone] Fax: [phone]   Readmission Risk Interventions Readmission Risk Prevention Plan 02/15/2020   Transportation Screening Complete  PCP or Specialist Appt within 3-5 Days Not Complete  HRI or Home Care Consult Complete  Social Work Consult for [place] Planning/Counseling Complete  Palliative Care Screening Not Complete  Medication Review Press photographer) Complete  Some recent data might be hidden

## 2020-02-15 NOTE — Evaluation (Signed)
Physical Therapy Evaluation Patient Details Name: Dustin [name] MRN: [id number] DOB: 03-11-1934 Today's Date: 02/15/2020   History of Present Illness  Dustin [name] is a 86 y.o. male with medical history significant for COPD, hypertension, diastolic CHF, BPH, left bundle branch block, Alzheimer's, obstructive sleep apnea and coronary artery disease who presents to the emergency department due to 3-day onset of worsening confusion.  Patient was unable to provide history on why he came to the ED.  History was obtained from ED physician and ED chart.  Per report, patient was reported to be talking to his dead parents since last 3 days.  Patient was recently seen at [facility] on the 14th with normal tests at that time.  Patient used to be on home oxygen but this was stopped by his PCP.  Patient' complaint of shortness of breath, so pulse ox was checked and it was in the 70s.  Due to patient's confusion and low level of oxygen, EMS was activated and patient was taken to the ED for further evaluation and management.  There was no report of fever, nausea, vomiting, sore throat, abdominal pain or diarrhea.    Clinical Impression  Patient functioning near baseline for functional mobility and gait, slightly increased time for functional activity and ambulation in hallway without loss of balance, limited secondary to c/o fatigue and tolerated sitting up in chair after therapy.  Plan:  Patient discharged from physical therapy to care of nursing for ambulation daily as tolerated for length of stay.     Follow Up Recommendations No PT follow up    Equipment Recommendations  None recommended by PT    Recommendations for Other Services       Precautions / Restrictions Precautions Precautions: None Restrictions Weight Bearing Restrictions: No      Mobility  Bed Mobility Overal bed mobility: Modified Independent             General bed mobility comments: slightly increased  time  Transfers Overall transfer level: Modified independent Equipment used: None             General transfer comment: slightly increased time  Ambulation/Gait Ambulation/Gait assistance: Modified independent (Device/Increase time) Gait Distance (Feet): 100 Feet Assistive device: None Gait Pattern/deviations: WFL(Within Functional Limits) Gait velocity: decreased   General Gait Details: slightly labored cadence with good return for ambulation in room and hallway without loss of balance  Stairs            Wheelchair Mobility    Modified Rankin (Stroke Patients Only)       Balance Overall balance assessment: No apparent balance deficits (not formally assessed)                                           Pertinent Vitals/Pain Pain Assessment: Faces Faces Pain Scale: Hurts a little bit Pain Location: bilateral feet, knees and hands due to arthritis Pain Descriptors / Indicators: Aching;Discomfort Pain Intervention(s): Limited activity within patient's tolerance;Monitored during session    Home Living Family/patient expects to be discharged to:: Private residence Living Arrangements: Spouse/significant other Available Help at Discharge: Family;Available PRN/intermittently Type of Home: House Home Access: Level entry     Home Layout: One level Home Equipment: Walker - 2 wheels;Cane - single point      Prior Function Level of Independence: Independent         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     Hand Dominance   Dominant Hand: Right    Extremity/Trunk Assessment   Upper Extremity Assessment Upper Extremity Assessment: Overall WFL for tasks assessed    Lower Extremity Assessment Lower Extremity Assessment: Overall WFL for tasks assessed    Cervical / Trunk Assessment Cervical / Trunk Assessment: Normal  Communication   Communication: No difficulties  Cognition Arousal/Alertness: Awake/alert Behavior During  Therapy: WFL for tasks assessed/performed Overall Cognitive Status: Within Functional Limits for tasks assessed                                        General Comments      Exercises     Assessment/Plan    PT Assessment Patent does not need any further PT services  PT Problem List         PT Treatment Interventions      PT Goals (Current goals can be found in the Care Plan section)  Acute Rehab PT Goals Patient Stated Goal: return home with family to assist PT Goal Formulation: With patient Time For Goal Achievement: 02/15/20 Potential to Achieve Goals: Good    Frequency     Barriers to discharge        Co-evaluation               AM-PAC PT "6 Clicks" Mobility  Outcome Measure Help needed turning from your back to your side while in a flat bed without using bedrails?: None Help needed moving from lying on your back to sitting on the side of a flat bed without using bedrails?: None Help needed moving to and from a bed to a chair (including a wheelchair)?: None Help needed standing up from a chair using your arms (e.g., wheelchair or bedside chair)?: None Help needed to walk in hospital room?: None Help needed climbing 3-5 steps with a railing? : None 6 Click Score: 24    End of Session   Activity Tolerance: Patient tolerated treatment well;Patient limited by fatigue Patient left: in chair;with call bell/phone within reach Nurse Communication: Mobility status PT Visit Diagnosis: Unsteadiness on feet (R26.81);Other abnormalities of gait and mobility (R26.89);Muscle weakness (generalized) (M62.81)    Time: [id number] PT Time Calculation (min) (ACUTE ONLY): 23 min   Charges:   PT Evaluation $PT Eval Moderate Complexity: 1 Mod PT Treatments $Therapeutic Activity: 23-37 mins        9:23 AM, 02/15/20 Lonell Grandchild, MPT Physical Therapist with [facility] [phone] office [phone] mobile phone

## 2020-02-15 NOTE — Progress Notes (Signed)
Pt on room air, ambulated in hallway w/ RN standby. Pulse ox attached reading 95% at the start. Pt has steady gait. Walked 90 feet before becoming hypoxic dropping to 88%. Amb back to room independently. Pt sat in chair. O2 2l Ava applied and patient came up to 98%. MD aware. Pt ambulated a total of 180 feet.

## 2020-02-15 NOTE — Progress Notes (Signed)
Patient scheduled for outpatient Remdesivir infusion at 10:00 AM on Sunday 2/21.  Please advise them to report to [facility] at [address]  Drive to the security guard and tell them you are here for an infusion. They will direct you to the front entrance where we will come and get you.  For questions call [phone].  Thanks

## 2020-02-15 NOTE — Progress Notes (Signed)
PROGRESS NOTE    Dustin [name]  [id number] DOB: 1934/03/30 DOA: 02/12/2020 PCP: [name], MD     Brief Narrative:  As per H&P written by Dr. Josephine Cables on 02/13/2020 86 y.o. male with medical history significant for COPD, hypertension, diastolic CHF, BPH, left bundle branch block, Alzheimer's, obstructive sleep apnea and coronary artery disease who presents to the emergency department due to 3-day onset of worsening confusion.  Patient was unable to provide history on why he came to the ED.  History was obtained from ED physician and ED chart.  Per report, patient was reported to be talking to his dead parents since last 3 days.  Patient was recently seen at [facility] on the 14th with normal tests at that time.  Patient used to be on home oxygen but this was stopped by his PCP.  Patient' complaint of shortness of breath, so pulse ox was checked and it was in the 70s.  Due to patient's confusion and low level of oxygen, EMS was activated and patient was taken to the ED for further evaluation and management.  There was no report of fever, nausea, vomiting, sore throat, abdominal pain or diarrhea.  ED Course:  In the emergency department, he was hypoxic with an O2 sat of chest with contrast %.  Other vital signs are within normal range.  Work-up in the ED showed normocytic anemia, hyponatremia, hypokalemia, elevated D-dimer at 1.47, CRP 7.2, LDH 274,AST 54. SARS coronavirus was positive.  CT angiography chest with contrast ruled out pulmonary embolus but showed bilateral patchy groundglass opacity within both lungs, with slight basilar predominance and with findings suspicious for COVID-19 pneumonia.  Chest x-ray showed no active disease. Breathing treatment was provided and hospitalist was asked to admit patient for further evaluation and management.  Assessment & Plan: 1-acute respiratory failure with hypoxia secondary to COVID-19 -Good oxygen saturation at rest and requiring 2 L nasal  cannula supplementation to maintain oxygen the stability on exertion. -Reports feeling better overall. -No fever -Stable inflammatory markers, and appropriately trending down. -Day #3 of intravenous infusion of remdesivir and steroids. -Continue as needed bronchodilators and the use of flutter valve. -Low procalcitonin level, normal WBCs and no fever; presentation secondary to viral infection antibiotic has been discontinued. -Patient remains stable will discharge home after his force dose of remdesivir on 02/16/2020 and pursued last infusion on 02/17/2020 at the outpatient infusion center.  Oxygen supplementation has been ordered especially to be used on exertion ( 2 L nasal cannula).  2-OSA (obstructive sleep apnea) -No CPAP secondary to acute hospitalization as per COVID-19 protocol. -Continue oxygen supplementation especially nighttime to prevent hypoxia during sleep.  3-hypertension -Overall stable -Continue current antihypertensive regimen.  4-hypothyroidism -Continue the use of Synthroid.  5-GERD -Continue PPI  6-history of coronary artery disease -Status post CABG -Patient denies chest pain -Continue Plavix and statins -No acute ischemic changes appreciated on EKG or telemetry.  7-chronic diastolic heart failure -Last ejection fraction 55 to 60% -Follow daily weights and strict I's and O's  8-history of BPH -Continue finasteride and Flomax.  9-Alzheimer's disease with depression/anxiety -Stable mood -Continue memantine, Lexapro Seroquel and buspirone    10-essential hypertension -Stable -Continue current antihypertensive regimen.  11-chronic pain syndrome -Will continue pain analgesic regimen -Follow response and adjust dosages as needed.  DVT prophylaxis: Lovenox Code Status: Full code Family Communication: No family at bedside. Disposition Plan: Remains inpatient, continue IV steroids and remdesivir; day 2/5 of infusion.  Responding  appropriately.  Consultants:  none  Procedures:   See below for x-ray reports.  Antimicrobials:  Anti-infectives (From admission, onward)   Start     Dose/Rate Route Frequency Ordered Stop   02/14/20 1000  remdesivir 100 mg in sodium chloride 0.9 % 100 mL IVPB     100 mg 200 mL/hr over 30 Minutes Intravenous Daily 02/13/20 0551 02/18/20 0959   02/14/20 1000  azithromycin (ZITHROMAX) tablet 250 mg  Status:  Discontinued     250 mg Oral Daily 02/13/20 0632 02/14/20 2118   02/13/20 1000  azithromycin (ZITHROMAX) tablet 500 mg     500 mg Oral Daily 02/13/20 0632 02/13/20 1214   02/13/20 0700  remdesivir 200 mg in sodium chloride 0.9% 250 mL IVPB     200 mg 580 mL/hr over 30 Minutes Intravenous Once 02/13/20 0551 02/13/20 1114      Subjective: No fever, no chest pain, no nausea, no vomiting.  Complaining of chronic notified to take Mycelex and expressing improvement in his respiratory status.  At rest no longer requiring oxygen supplementation but followed hypoxic on exertion after ambulating 180 feet, O2 sat dropped to 88% with improvement back to high 90s after 2 L nasal supplementation applied.  Objective: Vitals:   02/15/20 0611 02/15/20 0800 02/15/20 0803 02/15/20 1530  BP: 138/72 (!) 142/53  118/76  Pulse: (!) 51 74    Resp: 16   20  Temp: (!) 97.4 F (36.3 C) 97.8 F (36.6 C)  97.8 F (36.6 C)  TempSrc: Oral Oral  Oral  SpO2: 95% 94% 96% 98%  Weight:       No intake or output data in the 24 hours ending 02/15/20 1731 Filed Weights   02/13/20 0451 02/14/20 0311  Weight: 95.3 kg 88.9 kg    Examination: General exam: Alert and completed; feeling better and no requiring oxygen supplementation at rest.  Patient is afebrile and complaining of chronic neuropathic pain. Respiratory system: Clear to auscultation. Respiratory effort normal. Cardiovascular system:RRR. No murmurs, rubs, gallops. Gastrointestinal system: Abdomen is nondistended, soft and nontender. No  organomegaly or masses felt. Normal bowel sounds heard. Central nervous system:  No focal neurological deficits. Extremities: No C/C/E, +pedal pulses Skin: No rashes, lesions or ulcers Psychiatry: Judgement and insight appear normal. Mood & affect appropriate.    Data Reviewed: I have personally reviewed following labs and imaging studies  CBC: Recent Labs  Lab 02/13/20 0033 02/14/20 0457 02/15/20 0454  WBC 6.8 5.5 7.8  NEUTROABS 5.5 4.1 6.2  HGB 11.0* 11.5* 10.6*  HCT 34.7* 36.5* 34.1*  MCV 91.8 93.1 92.7  PLT 228 259 [id number]   Basic Metabolic Panel: Recent Labs  Lab 02/13/20 0033 02/14/20 0457 02/15/20 0454  NA 132* 139 139  K 3.4* 4.6 3.7  CL 96* 100 101  CO2 26 27 27   GLUCOSE 147* 138* 131*  BUN 13 16 21   CREATININE 1.09 0.93 1.00  CALCIUM 8.2* 9.1 8.9  MG  --  2.3 2.2  PHOS  --  2.7 2.7   GFR: Estimated Creatinine Clearance: 61.8 mL/min (by C-G formula based on SCr of 1 mg/dL).   Liver Function Tests: Recent Labs  Lab 02/13/20 0033 02/14/20 0457 02/15/20 0454  AST 54* 55* 32  ALT 33 39 33  ALKPHOS 38 37* 33*  BILITOT 0.9 0.6 0.6  PROT 6.5 6.3* 5.8*  ALBUMIN 3.3* 3.0* 2.8*   Lipid Profile: Recent Labs    02/13/20 0229  TRIG 83   Anemia Panel: Recent Labs  02/14/20 0457 02/15/20 0454  FERRITIN 391* 341*   Urine analysis:    Component Value Date/Time   COLORURINE YELLOW 12/07/2019 1931   APPEARANCEUR CLEAR 12/07/2019 1931   LABSPEC 1.012 12/07/2019 1931   PHURINE 5.0 12/07/2019 1931   GLUCOSEU NEGATIVE 12/07/2019 1931   HGBUR NEGATIVE 12/07/2019 1931   BILIRUBINUR NEGATIVE 12/07/2019 1931   KETONESUR NEGATIVE 12/07/2019 1931   PROTEINUR NEGATIVE 12/07/2019 1931   UROBILINOGEN 0.2 01/26/2015 2247   NITRITE NEGATIVE 12/07/2019 1931   LEUKOCYTESUR NEGATIVE 12/07/2019 1931    Recent Results (from the past 240 hour(s))  Culture, blood (routine x 2)     Status: None (Preliminary result)   Collection Time: 02/13/20 12:33 AM   Specimen:  BLOOD  Result Value Ref Range Status   Specimen Description BLOOD RIGHT ANTECUBITAL  Final   Special Requests   Final    BOTTLES DRAWN AEROBIC AND ANAEROBIC Blood Culture results may not be optimal due to an excessive volume of blood received in culture bottles   Culture   Final    NO GROWTH 2 DAYS Performed at [facility], [address], [place], [place] [address]    Report Status PENDING  Incomplete  Culture, blood (routine x 2)     Status: None (Preliminary result)   Collection Time: 02/13/20 12:42 AM   Specimen: BLOOD  Result Value Ref Range Status   Specimen Description BLOOD RIGHT ANTECUBITAL  Final   Special Requests   Final    BOTTLES DRAWN AEROBIC AND ANAEROBIC Blood Culture adequate volume   Culture   Final    NO GROWTH 2 DAYS Performed at [facility], [address] [place], [place] [address]    Report Status PENDING  Incomplete  Respiratory Panel by RT PCR (Flu A&B, Covid) - Nasopharyngeal Swab     Status: Abnormal   Collection Time: 02/13/20  2:30 AM   Specimen: Nasopharyngeal Swab  Result Value Ref Range Status   SARS Coronavirus 2 by RT PCR POSITIVE (A) NEGATIVE Final    Comment: RESULT CALLED TO, READ BACK BY AND VERIFIED WITH: K BELTON,RN @0409  02/13/20 MKELLY (NOTE) SARS-CoV-2 target nucleic acids are DETECTED. SARS-CoV-2 RNA is generally detectable in upper respiratory specimens  during the acute phase of infection. Positive results are indicative of the presence of the identified virus, but do not rule out bacterial infection or co-infection with other pathogens not detected by the test. Clinical correlation with patient history and other diagnostic information is necessary to determine patient infection status. The expected result is Negative. Fact Sheet for Patients:  PinkCheek.be Fact Sheet for Healthcare Providers: GravelBags.it This test is not yet approved or cleared by the [place] FDA  and  has been authorized for detection and/or diagnosis of SARS-CoV-2 by FDA under an Emergency Use Authorization (EUA).  This EUA will remain in effect (meaning this test can be used) for  the duration of  the COVID-19 declaration under Section 564(b)(1) of the Act, 21 U.S.C. section 360bbb-3(b)(1), unless the authorization is terminated or revoked sooner.    Influenza A by PCR NEGATIVE NEGATIVE Final   Influenza B by PCR NEGATIVE NEGATIVE Final    Comment: (NOTE) The Xpert Xpress SARS-CoV-2/FLU/RSV assay is intended as an aid in  the diagnosis of influenza from Nasopharyngeal swab specimens and  should not be used as a sole basis for treatment. Nasal washings and  aspirates are unacceptable for Xpert Xpress SARS-CoV-2/FLU/RSV  testing. Fact Sheet for Patients: PinkCheek.be Fact Sheet for Healthcare Providers:  GravelBags.it This test is not yet approved or cleared by the [place] and  has been authorized for detection and/or diagnosis of SARS-CoV-2 by  FDA under an Emergency Use Authorization (EUA). This EUA will remain  in effect (meaning this test can be used) for the duration of the  Covid-19 declaration under Section 564(b)(1) of the Act, 21  U.S.C. section 360bbb-3(b)(1), unless the authorization is  terminated or revoked. Performed at [facility], [address], [place],  [address]      Radiology Studies: No results found.  Scheduled Meds: . ascorbic acid  500 mg Oral Daily  . busPIRone  10 mg Oral BID  . clopidogrel  75 mg Oral Daily  . dexamethasone (DECADRON) injection  6 mg Intravenous Q24H  . docusate sodium  100 mg Oral BID  . dutasteride  0.5 mg Oral Q24H  . enoxaparin (LOVENOX) injection  40 mg Subcutaneous Q24H  . escitalopram  20 mg Oral q morning - [id number]  . folic acid  1 mg Oral Daily  . furosemide  20 mg Oral BID  . isosorbide mononitrate  90 mg Oral Daily  . levothyroxine  50 mcg  Oral QAC breakfast  . magnesium citrate  1 Bottle Oral Once  . memantine  10 mg Oral BID  . multivitamin with minerals  1 tablet Oral Daily  . pantoprazole  40 mg Oral q morning - 10a  . QUEtiapine  25 mg Oral QHS  . rosuvastatin  40 mg Oral q1800  . tamsulosin  0.4 mg Oral Daily  . zinc sulfate  220 mg Oral Daily   Continuous Infusions: . remdesivir 100 mg in NS 100 mL 100 mg (02/15/20 1016)     LOS: 2 days    Time spent: 30 minutes.    Barton Dubois, MD [name] Pager [phone]   02/15/2020, 5:31 PM

## 2020-02-15 NOTE — Discharge Instructions (Addendum)
Please wear your Home Oxygen whenever you plan to walk farther than 90 feet, feel dizzy, lightheaded, or short of breath.  You are scheduled for an outpatient infusion of Remdesivir at 10:00 AM on Sunday 2/21.  Please [facility] to [facility] at [address]  Drive to the security guard and tell them you are here for an infusion. They will direct you to the front entrance where we will come and get you.  For questions call [phone].  Thanks

## 2020-02-15 NOTE — Progress Notes (Signed)
Patient found on two separate occasions opening his door and standing in the hallway unmasked. Patient instructed on the need to keep his door closed due to infection protocols while in a covid enviroment. He has continued to become agigtaed and combative with aspects of his care .On attempt to take his blood pressure he violently ripped off his BP cuff and refused to have his temp taken. Patient has PRN atarax which he requested, and was promptly given by RN. Hopefully this will be helpful in controlling his current behaviors/ agitation . I will continue to monitor patient closely as the shift progresses

## 2020-02-15 NOTE — Care Management Important Message (Signed)
Important Message  Patient Details  Name: Dustin [name] MRN: [id number] Date of Birth: [birth year]   Medicare Important Message Given:  Yes(Jason, RN will deliver letter to patient due to contact precautions)     Tommy Medal 02/15/2020, 3:04 PM

## 2020-02-16 DIAGNOSIS — R0902 Hypoxemia: Secondary | ICD-10-CM

## 2020-02-16 LAB — CBC WITH DIFFERENTIAL/PLATELET
Abs Immature Granulocytes: 0.06 10*3/uL (ref 0.00–0.07)
Basophils Absolute: 0 10*3/uL (ref 0.0–0.1)
Basophils Relative: 0 %
Eosinophils Absolute: 0 10*3/uL (ref 0.0–0.5)
Eosinophils Relative: 0 %
HCT: 37.1 % — ABNORMAL LOW (ref 39.0–52.0)
Hemoglobin: 11.6 g/dL — ABNORMAL LOW (ref 13.0–17.0)
Immature Granulocytes: 1 %
Lymphocytes Relative: 10 %
Lymphs Abs: 0.9 10*3/uL (ref 0.7–4.0)
MCH: 28.4 pg (ref 26.0–34.0)
MCHC: 31.3 g/dL (ref 30.0–36.0)
MCV: 90.9 fL (ref 80.0–100.0)
Monocytes Absolute: 0.6 10*3/uL (ref 0.1–1.0)
Monocytes Relative: 7 %
Neutro Abs: 7.7 10*3/uL (ref 1.7–7.7)
Neutrophils Relative %: 82 %
Platelets: 377 10*3/uL (ref 150–400)
RBC: 4.08 MIL/uL — ABNORMAL LOW (ref 4.22–5.81)
RDW: 13 % (ref 11.5–15.5)
WBC: 9.3 10*3/uL (ref 4.0–10.5)
nRBC: 0 % (ref 0.0–0.2)

## 2020-02-16 LAB — COMPREHENSIVE METABOLIC PANEL
ALT: 36 U/L (ref 0–44)
AST: 41 U/L (ref 15–41)
Albumin: 3.3 g/dL — ABNORMAL LOW (ref 3.5–5.0)
Alkaline Phosphatase: 39 U/L (ref 38–126)
Anion gap: 10 (ref 5–15)
BUN: 21 mg/dL (ref 8–23)
CO2: 29 mmol/L (ref 22–32)
Calcium: 9 mg/dL (ref 8.9–10.3)
Chloride: 101 mmol/L (ref 98–111)
Creatinine, Ser: 1.14 mg/dL (ref 0.61–1.24)
GFR calc Af Amer: >60 mL/min (ref >60)
GFR calc non Af Amer: 59 mL/min — ABNORMAL LOW (ref >60)
Glucose, Bld: 116 mg/dL — ABNORMAL HIGH (ref 70–99)
Potassium: 3.1 mmol/L — ABNORMAL LOW (ref 3.5–5.1)
Sodium: 140 mmol/L (ref 135–145)
Total Bilirubin: 0.8 mg/dL (ref 0.3–1.2)
Total Protein: 6.4 g/dL — ABNORMAL LOW (ref 6.5–8.1)

## 2020-02-16 LAB — FERRITIN: Ferritin: 362 ng/mL — ABNORMAL HIGH (ref 24–336)

## 2020-02-16 LAB — D-DIMER, QUANTITATIVE: D-Dimer, Quant: 0.68 ug/mL-FEU — ABNORMAL HIGH (ref 0.00–0.50)

## 2020-02-16 LAB — MAGNESIUM: Magnesium: 2.1 mg/dL (ref 1.7–2.4)

## 2020-02-16 LAB — PHOSPHORUS: Phosphorus: 2.1 mg/dL — ABNORMAL LOW (ref 2.5–4.6)

## 2020-02-16 LAB — C-REACTIVE PROTEIN: CRP: 1.5 mg/dL — ABNORMAL HIGH (ref <1.0)

## 2020-02-16 MED ORDER — ASCORBIC ACID 500 MG PO TABS: 500.0000 mg | ORAL_TABLET | Freq: Every day | ORAL | 1 refills | Status: DC

## 2020-02-16 MED ORDER — ZINC SULFATE 220 (50 ZN) MG PO CAPS: 220.0000 mg | ORAL_CAPSULE | Freq: Every day | ORAL | 1 refills | Status: DC

## 2020-02-16 MED ORDER — PANTOPRAZOLE SODIUM 40 MG PO TBEC: 40.0000 mg | DELAYED_RELEASE_TABLET | Freq: Every morning | ORAL | 1 refills | Status: DC

## 2020-02-16 MED ORDER — DEXAMETHASONE 6 MG PO TABS: 6.0000 mg | ORAL_TABLET | Freq: Every day | ORAL | 0 refills | Status: AC

## 2020-02-16 MED ORDER — GUAIFENESIN-DM 100-10 MG/5ML PO SYRP: 10.0000 mL | ORAL_SOLUTION | ORAL | 0 refills | Status: DC | PRN

## 2020-02-16 NOTE — Discharge Summary (Signed)
Physician Discharge Summary  Dustin [name] [id number] DOB: 01-29-1934 DOA: 02/12/2020  PCP: [name], MD  Admit date: 02/12/2020 Discharge date: 02/16/2020  Time spent: 35 minutes  Recommendations for Outpatient Follow-up:  1. Repeat basic level electrolytes thyroid function 2. Repeat chest x-ray in 6 weeks to assure complete resolution of bilateral infiltrates 3. Continue weaning oxygen supplementation of 20 mL urinary replace   Discharge Diagnoses:  Acute on chronic resp failure. Pneumonia due to COVID-19 virus OSA (obstructive sleep apnea) HYPERTENSION, BENIGN Hypothyroidism Hypokalemia   Discharge Condition: stable and improved. Will discharge home with instructions to follow up with PCP in 2 weeks.  Diet recommendation: heart healthy   Filed Weights   02/13/20 0451 02/14/20 0311  Weight: 95.3 kg 88.9 kg    History of present illness:  As per H&P written by Dr. Josephine Cables on 02/13/2020 85 y.o.malewith medical history significant forCOPD, hypertension, diastolic CHF, BPH, left bundle branch block, Alzheimer's, obstructive sleep apneaandcoronary artery diseasewho presents to the emergency department due to 3-day onset of worsening confusion.Patient was unable to provide history on why he came to the ED. History was obtained from ED physician and ED chart. Per report,patient was reported to be talking to his deadparents since last 3 days. Patient was recently [facility] on [facility]th with normal tests at that time. Patient used to be on home oxygen but this was stopped by his PCP. Patient' complaint of shortness of breath, so pulse ox was checked and it was in the 70s.Due to patient's confusion and low level of oxygen, EMS was activated and patient was taken to the ED for further evaluation and management. There was no report of fever, nausea, vomiting, sore throat, abdominal pain or diarrhea.  ED Course: In the emergency department, he was  hypoxic with an O2 sat of chest with contrast %. Other vital signs are within normal range. Work-up in the ED showed normocytic anemia, hyponatremia, hypokalemia, elevated D-dimer at 1.47,CRP 7.2, LDH 274,AST 54. SARScoronavirus was positive. CT angiography chest with contrast ruled out pulmonary embolus but showed bilateral patchy groundglass opacity within both lungs, with slight basilar predominance and with findings suspicious for COVID-19 pneumonia. Chest x-ray showed no active disease. Breathing treatment was providedand hospitalist was asked to admit patient for further evaluation and management.  Hospital Course:  1-acute respiratory failure with hypoxia secondary to COVID-19 -No fever -Stable inflammatory markers, and appropriately trending down. -Continue as needed bronchodilators and the use of flutter valve. -Low procalcitonin level, normal WBCs and no fever; presentation secondary to viral infection antibiotic has been discontinued. -Patient has remained stable and appropriate to discharge home with 2L O2 supplementation on exertion, completion of oral decadron and arranged last dose of remdesivir on 02/17/2020 at the outpatient infusion center.    2-OSA (obstructive sleep apnea) -resume the use of CPAP QHS.  3-essential hypertension -Stable -Continue current antihypertensive regimen. -heart Healthy diet has been encouraged.  4-hypothyroidism -Continue the use of Synthroid.  5-GERD -Continue PPI  6-history of coronary artery disease -Status post CABG -Patient denies chest pain -Continue Plavix and statins -No acute ischemic changes appreciated on EKG or telemetry.  7-chronic diastolic heart failure -Last ejection fraction 55 to 60% -Follow daily weights -compensated.  8-History of BPH -Continue finasteride and Flomax. -No urinary retention.  9-Alzheimer's disease with depression/anxiety -Stable mood -Continue memantine, Lexapro Seroquel and  buspirone    10-chronic pain syndrome -Will continue pain analgesic regimen  Procedures:  See below for x-ray reports.  Consultations:  None  Discharge Exam: Vitals:   02/16/20 0454 02/16/20 0800  BP: (!) 141/57 136/78  Pulse: (!) 58   Resp: 20   Temp: 97.9 F (36.6 C) 97.9 F (36.6 C)  SpO2: 95% 95%   General exam: Alert and cooperative; feeling better and no requiring oxygen supplementation at rest. Patient is afebrile and just complaining of chronic neuropathic pain.  Found to require 2 L of oxygen supplementation on exertion. Respiratory system: Clear to auscultation. Respiratory effort normal.  No wheezing, no using accessory muscles. Cardiovascular system:RRR. No murmurs, rubs, gallops. Gastrointestinal system: Abdomen is nondistended, soft and nontender. No organomegaly or masses felt. Normal bowel sounds heard. Central nervous system:  No focal neurological deficits. Extremities: No C/C/E, +pedal pulses Skin: No rashes, lesions or ulcers Psychiatry: Judgement and insight appear normal. Mood & affect appropriate.   Discharge Instructions   Discharge Instructions    Diet - low sodium heart healthy   Complete by: As directed    Discharge instructions   Complete by: As directed    Follow the 3 W's (always wear mask, washing hands frequently and keeping yourself 6 feet apart) TakeMedications are prescribed Maintain adequate hydration Follow-up for outpatient remdesivir infusion as instructed by 02/16/2019 Follow-up with PCP in 2 weeks     Allergies as of 02/16/2020   No Known Allergies     Medication List    STOP taking these medications   doxazosin 1 MG tablet Commonly known as: CARDURA     TAKE these medications   albuterol 108 (90 Base) MCG/ACT inhaler Commonly known as: VENTOLIN HFA Inhale 2 puffs into the lungs every 6 (six) hours as needed for wheezing or shortness of breath.   albuterol (2.5 MG/3ML) 0.083% nebulizer solution Commonly known as:  PROVENTIL Take 3 mLs (2.5 mg total) by nebulization every 6 (six) hours as needed for wheezing or shortness of breath. Patient will also need the nebulizer machine with this prescription.   ascorbic acid 500 MG tablet Commonly known as: VITAMIN C Take 1 tablet (500 mg total) by mouth daily. Start taking on: February 17, 2020   Avodart 0.5 MG capsule Generic drug: dutasteride Take 0.5 mg by mouth. In the afternoon   busPIRone 10 MG tablet Commonly known as: BUSPAR Take 10 mg by mouth 2 (two) times daily.   clopidogrel 75 MG tablet Commonly known as: PLAVIX Take 1 tablet (75 mg total) by mouth daily.   dexamethasone 6 MG tablet Commonly known as: DECADRON Take 1 tablet (6 mg total) by mouth daily for 7 days.   escitalopram 20 MG tablet Commonly known as: LEXAPRO Take 20 mg by mouth every morning.   furosemide 20 MG tablet Commonly known as: LASIX Take 20 mg by mouth 2 (two) times daily.   guaiFENesin-dextromethorphan 100-10 MG/5ML syrup Commonly known as: ROBITUSSIN DM Take 10 mLs by mouth every 4 (four) hours as needed for cough.   hydrOXYzine 25 MG tablet Commonly known as: ATARAX/VISTARIL Take 25 mg by mouth 4 (four) times daily as needed for anxiety.   isosorbide mononitrate 30 MG 24 hr tablet Commonly known as: IMDUR Take 3 tablets (90 mg total) by mouth daily.   levothyroxine 50 MCG tablet Commonly known as: SYNTHROID Take 50 mcg by mouth every morning.   memantine 10 MG tablet Commonly known as: NAMENDA Take 10 mg by mouth 2 (two) times daily.   nitroGLYCERIN 0.4 MG SL tablet Commonly known as: NITROSTAT Place 1 tablet (0.4 mg total) under the tongue every  5 (five) minutes as needed for chest pain. PLACE ONE TABLET UNDER TONGUE EVERY 5 MIN UP TO 3 DOSES AS NEEDED FORCHEST PAIN.   oxycodone 30 MG immediate release tablet Commonly known as: ROXICODONE Take 30 mg by mouth every 4 (four) hours as needed for pain. Take 1 tablet by mouth every 4 to 6 hours as  needed for pain.  Max 8 /24 hours.   pantoprazole 40 MG tablet Commonly known as: PROTONIX Take 1 tablet (40 mg total) by mouth every morning.   potassium chloride SA 20 MEQ tablet Commonly known as: KLOR-CON Take 20 mEq by mouth daily.   QUEtiapine 25 MG tablet Commonly known as: SEROQUEL Take 25 mg by mouth at bedtime.   rosuvastatin 40 MG tablet Commonly known as: CRESTOR Take 1 tablet (40 mg total) by mouth daily at 6 PM.   tamsulosin 0.4 MG Caps capsule Commonly known as: FLOMAX Take 1 capsule (0.4 mg total) by mouth daily. To help you pee.   zinc sulfate 220 (50 Zn) MG capsule Take 1 capsule (220 mg total) by mouth daily. Start taking on: February 17, 2020            Durable Medical Equipment  (From admission, onward)         Start     Ordered   02/15/20 1725  DME Oxygen  (Discharge Planning)  Once    Question Answer Comment  Length of Need 6 Months   Mode or (Route) Nasal cannula   Liters per Minute 2   Frequency Continuous (stationary and portable oxygen unit needed)   Oxygen conserving device Yes   Oxygen delivery system Gas      02/15/20 1724         No Known Allergies Follow-up Information    [place]. Go to.   Why:  See instruction below. Contact information: [place] [phone]       [name], MD. Schedule an appointment as soon as possible for a visit in 2 week(s).   Specialty: Internal Medicine Contact information: [address] [place] [id number] [phone]        Satira Sark, MD .   Specialty: Cardiology Contact information: [place] [address] [phone]           The results of significant diagnostics from this hospitalization (including imaging, microbiology, ancillary and laboratory) are listed below for reference.    Significant Diagnostic Studies: CT Angio Chest PE W/Cm &/Or Wo Cm  Result  Date: 02/13/2020 CLINICAL DATA:  Shortness of breath, hypoxia, elevated D-dimer. EXAM: CT ANGIOGRAPHY CHEST WITH CONTRAST TECHNIQUE: Multidetector CT imaging of the chest was performed using the standard protocol during bolus administration of intravenous contrast. Multiplanar CT image reconstructions and MIPs were obtained to evaluate the vascular anatomy. CONTRAST:  168mL OMNIPAQUE IOHEXOL 350 MG/ML SOLN COMPARISON:  Radiograph earlier this day. Chest CT 02/12/2017 FINDINGS: Cardiovascular: There are no filling defects within the pulmonary arteries to suggest pulmonary embolus. Thoracic aortic atherosclerosis without dissection or acute aortic abnormality. Post CABG with calcification of native coronary arteries. Mild multi chamber cardiomegaly. No pericardial effusion. Mediastinum/Nodes: Mild bilateral hilar adenopathy, largest node in the right infrahilar region measures 12 mm. Few prominent mediastinal lymph nodes are all subcentimeter. No esophageal wall thickening. No thyroid nodule. Lungs/Pleura: Mild emphysema. Bilateral patchy ground-glass opacities within both lungs, slight basilar predominance. Trace right pleural thickening without significant effusion. Trachea and central mainstem  bronchi are patent. No evidence of pulmonary edema. Upper Abdomen: No acute findings. Musculoskeletal: Post median sternotomy. Degenerative change in the spine. There are no acute or suspicious osseous abnormalities. Minimal chronic loss of height of T1 superior endplate. Review of the MIP images confirms the above findings. IMPRESSION: 1. No pulmonary embolus. 2. Bilateral patchy ground-glass opacities within both lungs, slight basilar predominance. Findings are suspicious for COVID-19 pneumonia. Other atypical infectious or inflammatory etiologies are also considered. 3. Mild bilateral hilar adenopathy is likely reactive. Aortic Atherosclerosis (ICD10-I70.0) and Emphysema (ICD10-J43.9). Electronically Signed   By: Keith Rake M.D.   On: 02/13/2020 02:22   DG Chest Port 1 View  Result Date: 02/13/2020 CLINICAL DATA:  Shortness of breath and hypoxia EXAM: PORTABLE CHEST 1 VIEW COMPARISON:  None. FINDINGS: The heart size and mediastinal contours are within normal limits. Both lungs are clear. The visualized skeletal structures are unremarkable. Remote CABG IMPRESSION: No active disease. Electronically Signed   By: Ulyses Jarred M.D.   On: 02/13/2020 00:56    Microbiology: Recent Results (from the past 240 hour(s))  Culture, blood (routine x 2)     Status: None (Preliminary result)   Collection Time: 02/13/20 12:33 AM   Specimen: BLOOD  Result Value Ref Range Status   Specimen Description BLOOD RIGHT ANTECUBITAL  Final   Special Requests   Final    BOTTLES DRAWN AEROBIC AND ANAEROBIC Blood Culture results may not be optimal due to an excessive volume of blood received in culture bottles   Culture   Final    NO GROWTH 3 DAYS Performed at [facility], [address], [place], [place] [address]    Report Status PENDING  Incomplete  Culture, blood (routine x 2)     Status: None (Preliminary result)   Collection Time: 02/13/20 12:42 AM   Specimen: BLOOD  Result Value Ref Range Status   Specimen Description BLOOD RIGHT ANTECUBITAL  Final   Special Requests   Final    BOTTLES DRAWN AEROBIC AND ANAEROBIC Blood Culture adequate volume   Culture   Final    NO GROWTH 3 DAYS Performed at [facility], [address] [place], [place] [address]    Report Status PENDING  Incomplete  Respiratory Panel by RT PCR (Flu A&B, Covid) - Nasopharyngeal Swab     Status: Abnormal   Collection Time: 02/13/20  2:30 AM   Specimen: Nasopharyngeal Swab  Result Value Ref Range Status   SARS Coronavirus 2 by RT PCR POSITIVE (A) NEGATIVE Final    Comment: RESULT CALLED TO, READ BACK BY AND VERIFIED WITH: K BELTON,RN @0409  02/13/20 MKELLY (NOTE) SARS-CoV-2 target nucleic acids are DETECTED. SARS-CoV-2 RNA is generally  detectable in upper respiratory specimens  during the acute phase of infection. Positive results are indicative of the presence of the identified virus, but do not rule out bacterial infection or co-infection with other pathogens not detected by the test. Clinical correlation with patient history and other diagnostic information is necessary to determine patient infection status. The expected result is Negative. Fact Sheet for Patients:  PinkCheek.be Fact Sheet for Healthcare Providers: GravelBags.it This test is not yet approved or cleared by the [place] FDA and  has been authorized for detection and/or diagnosis of SARS-CoV-2 by FDA under an Emergency Use Authorization (EUA).  This EUA will remain in effect (meaning this test can be used) for  the duration of  the COVID-19 declaration under Section 564(b)(1) of the Act, 21 U.S.C. section 360bbb-3(b)(1), unless  the authorization is terminated or revoked sooner.    Influenza A by PCR NEGATIVE NEGATIVE Final   Influenza B by PCR NEGATIVE NEGATIVE Final    Comment: (NOTE) The Xpert Xpress SARS-CoV-2/FLU/RSV assay is intended as an aid in  the diagnosis of influenza from Nasopharyngeal swab specimens and  should not be used as a sole basis for treatment. Nasal washings and  aspirates are unacceptable for Xpert Xpress SARS-CoV-2/FLU/RSV  testing. Fact Sheet for Patients: PinkCheek.be Fact Sheet for Healthcare Providers: GravelBags.it This test is not yet approved or cleared by the [place] FDA and  has been authorized for detection and/or diagnosis of SARS-CoV-2 by  FDA under an Emergency Use Authorization (EUA). This EUA will remain  in effect (meaning this test can be used) for the duration of the  Covid-19 declaration under Section 564(b)(1) of the Act, 21  U.S.C. section 360bbb-3(b)(1), unless the  authorization is  terminated or revoked. Performed at [facility], [address], [place], [place] [address]      Labs: Basic Metabolic Panel: Recent Labs  Lab 02/13/20 0033 02/14/20 0457 02/15/20 0454 02/16/20 0646  NA 132* 139 139 140  K 3.4* 4.6 3.7 3.1*  CL 96* 100 101 101  CO2 26 27 27 29   GLUCOSE 147* 138* 131* 116*  BUN 13 16 21 21   CREATININE 1.09 0.93 1.00 1.14  CALCIUM 8.2* 9.1 8.9 9.0  MG  --  2.3 2.2 2.1  PHOS  --  2.7 2.7 2.1*   Liver Function Tests: Recent Labs  Lab 02/13/20 0033 02/14/20 0457 02/15/20 0454 02/16/20 0646  AST 54* 55* 32 41  ALT 33 39 33 36  ALKPHOS 38 37* 33* 39  BILITOT 0.9 0.6 0.6 0.8  PROT 6.5 6.3* 5.8* 6.4*  ALBUMIN 3.3* 3.0* 2.8* 3.3*   CBC: Recent Labs  Lab 02/13/20 0033 02/14/20 0457 02/15/20 0454 02/16/20 0646  WBC 6.8 5.5 7.8 9.3  NEUTROABS 5.5 4.1 6.2 7.7  HGB 11.0* 11.5* 10.6* 11.6*  HCT 34.7* 36.5* 34.1* 37.1*  MCV 91.8 93.1 92.7 90.9  PLT 228 259 307 377   BNP (last 3 results) Recent Labs    12/07/19 1557 02/13/20 0033  BNP 41.0 44.0    CBG: Recent Labs  Lab 02/15/20 0821  GLUCAP 118*    Signed:  Barton Dubois MD.  [name] 02/16/2020, 3:02 PM

## 2020-02-16 NOTE — TOC Transition Note (Signed)
Transition of Care [facility]) - CM/SW Discharge Note   Patient Details  Name: Dustin [name] MRN: [id number] Date of Birth: 04-25-1934  [facility]) CM/SW Contact:  Boneta Lucks, RN Phone Number: 02/16/2020, 3:18 PM   Clinical Narrative:   Patient discharging home. Patient qualified for home oxygen. Orders placed. Spoke with daughter in law, discussed choices. Weekend, Called [employer], Blake Divine accepted the referral and will send driver to [employer] to bring oxygen for home use. RN updated.     Final next level of care: Home/Self Care Barriers to Discharge: Barriers Resolved   Patient Goals and CMS Choice Patient states their goals for this hospitalization and ongoing recovery are:: to go home. CMS Medicare.gov Compare Post Acute Care list provided to:: Patient Represenative (must comment) Choice offered to / list presented to : Adult Children  Discharge Placement                  Name of family member notified: Daughter in law answered wives phone Patient and family notified of of transfer: 02/16/20  Discharge Plan and Services                DME Arranged: Oxygen DME Agency: [employer] Date DME Agency Contacted: 02/16/20 Time DME Agency Contacted: 1509 Representative spoke with at DME Agency: Blake Divine     Readmission Risk Interventions Readmission Risk Prevention Plan 02/15/2020  Transportation Screening Complete  PCP or Specialist Appt within 3-5 Days Not Complete  HRI or [place] Complete  Social Work Consult for [place] Planning/Counseling [place] Not Complete  Medication Review Press photographer) Complete  Some recent data might be hidden

## 2020-02-17 ENCOUNTER — Encounter (HOSPITAL_COMMUNITY): Payer: Self-pay

## 2020-02-17 ENCOUNTER — Ambulatory Visit (HOSPITAL_COMMUNITY)
Admission: RE | Admit: 2020-02-17 | Discharge: 2020-02-17 | Disposition: A | Discharge status: Home / Self Care | Payer: PPO | Source: Ambulatory Visit | Attending: Pulmonary Disease | Admitting: Pulmonary Disease

## 2020-02-17 VITALS — BP 128/75 | HR 70 | Temp 98.2°F | Resp 18

## 2020-02-17 DIAGNOSIS — J1282 Pneumonia due to coronavirus disease 2019: Secondary | ICD-10-CM | POA: Insufficient documentation

## 2020-02-17 DIAGNOSIS — U071 COVID-19: Secondary | ICD-10-CM | POA: Insufficient documentation

## 2020-02-17 DIAGNOSIS — J449 Chronic obstructive pulmonary disease, unspecified: Secondary | ICD-10-CM | POA: Diagnosis not present

## 2020-02-17 MED ORDER — EPINEPHRINE 0.3 MG/0.3ML IJ SOAJ: 0.3000 mg | Freq: Once | INTRAMUSCULAR | Status: DC | PRN

## 2020-02-17 MED ORDER — METHYLPREDNISOLONE SODIUM SUCC 125 MG IJ SOLR: 125.0000 mg | Freq: Once | INTRAMUSCULAR | Status: DC | PRN

## 2020-02-17 MED ORDER — FAMOTIDINE IN NACL 20-0.9 MG/50ML-% IV SOLN: 20.0000 mg | Freq: Once | INTRAVENOUS | Status: DC | PRN

## 2020-02-17 MED ORDER — DIPHENHYDRAMINE HCL 50 MG/ML IJ SOLN: 50.0000 mg | Freq: Once | INTRAMUSCULAR | Status: DC | PRN

## 2020-02-17 MED ORDER — SODIUM CHLORIDE 0.9 % IV SOLN
100.0000 mg | Freq: Once | INTRAVENOUS | Status: AC
  Administered 2020-02-17: 10:00:00 100 mg via INTRAVENOUS
  Filled 2020-02-17: qty 20

## 2020-02-17 MED ORDER — ALBUTEROL SULFATE HFA 108 (90 BASE) MCG/ACT IN AERS: 2.0000 | INHALATION_SPRAY | Freq: Once | RESPIRATORY_TRACT | Status: DC | PRN

## 2020-02-17 MED ORDER — SODIUM CHLORIDE 0.9 % IV SOLN: INTRAVENOUS | Status: DC | PRN

## 2020-02-17 NOTE — Progress Notes (Signed)
  Diagnosis: COVID-19  Physician: Dr. Wright  Procedure: Covid Infusion Clinic Med: remdesivir infusion.  Complications: No immediate complications noted.  Discharge: Discharged home   Dustin [name]  Bell 02/17/2020  

## 2020-02-17 NOTE — Discharge Instructions (Signed)

## 2020-02-18 LAB — CULTURE, BLOOD (ROUTINE X 2)
Culture: NO GROWTH
Culture: NO GROWTH
Special Requests: ADEQUATE

## 2020-03-16 DIAGNOSIS — U071 COVID-19: Secondary | ICD-10-CM | POA: Diagnosis not present

## 2020-03-16 DIAGNOSIS — J449 Chronic obstructive pulmonary disease, unspecified: Secondary | ICD-10-CM | POA: Diagnosis not present

## 2020-03-17 ENCOUNTER — Encounter: Payer: Self-pay | Admitting: Cardiology

## 2020-03-17 ENCOUNTER — Ambulatory Visit (INDEPENDENT_AMBULATORY_CARE_PROVIDER_SITE_OTHER): Payer: PPO | Admitting: Cardiology

## 2020-03-17 VITALS — BP 122/76 | HR 58 | Temp 97.7°F | Ht 70.0 in | Wt 204.0 lb

## 2020-03-17 DIAGNOSIS — I10 Essential (primary) hypertension: Secondary | ICD-10-CM

## 2020-03-17 DIAGNOSIS — E782 Mixed hyperlipidemia: Secondary | ICD-10-CM

## 2020-03-17 DIAGNOSIS — I25119 Atherosclerotic heart disease of native coronary artery with unspecified angina pectoris: Secondary | ICD-10-CM

## 2020-03-17 MED ORDER — ISOSORBIDE MONONITRATE ER 30 MG PO TB24: 30.0000 mg | ORAL_TABLET | Freq: Every day | ORAL | 1 refills | Status: DC

## 2020-03-17 NOTE — Patient Instructions (Signed)
Medication Instructions:  START Aspirin 81 mg daily   Take Imdur 30 mg daily    *If you need a refill on your cardiac medications before your next appointment, please call your pharmacy*   Lab Work: None today If you have labs (blood work) drawn today and your tests are completely normal, you will receive your results only by: [name] MyChart Message (if you have MyChart) OR . A paper copy in the mail If you have any lab test that is abnormal or we need to change your treatment, we will call you to review the results.   Testing/Procedures: None today   Follow-Up: At [facility], you and your health needs are our priority.  As part of our continuing mission to provide you with exceptional heart care, we have created designated Provider Care Teams.  These Care Teams include your primary Cardiologist (physician) and Advanced Practice Providers (APPs -  Physician Assistants and Nurse Practitioners) who all work together to provide you with the care you need, when you need it.  We recommend signing up for the patient portal called "MyChart".  Sign up information is provided on this After Visit Summary.  MyChart is used to connect with patients for Virtual Visits (Telemedicine).  Patients are able to view lab/test results, encounter notes, upcoming appointments, etc.  Non-urgent messages can be sent to your provider as well.   To learn more about what you can do with MyChart, go to [email or web address].    Your next appointment:   6 month(s)  The format for your next appointment:   In Person  Provider:   Rozann Lesches, MD   Other Instructions None     Thank you for choosing [facility] !

## 2020-03-17 NOTE — Progress Notes (Signed)
Cardiology Office Note  Date: 03/17/2020   ID: Dustin [name], DOB 1934/07/29, MRN [id number]  PCP:  [name], MD  Cardiologist:  Rozann Lesches, MD Electrophysiologist:  None   Chief Complaint  Patient presents with  . Cardiac follow-up    History of Present Illness: Dustin [name] is an 86 y.o. male last seen in December 2020 by Ms. Barrett PA-C.  He is here today with his wife for a follow-up visit.  I reviewed his records, he was recently hospitalized in February with acute hypoxic respiratory failure and confusion, diagnosed with COVID-19 pneumonia.  No acute cardiac issues described at that time.  He states that he has had some falls while standing.  He was told by his daughter to stop taking his isosorbide with concerns that his blood pressure was too low at times.  He has had more angina symptoms.  He otherwise continues on Plavix, Lasix, potassium supplements, and Crestor.  He has not been taking aspirin regularly.  Today we reviewed his medications in detail.  I recommended that he start back on aspirin 81 mg daily, also resume Imdur at 30 mg daily.  He is not on any other antihypertensives at this time.  Past Medical History:  Diagnosis Date  . Alzheimer disease ([place])   . Anxiety   . Arthritis   . Atrophic gastritis    a. By EGD 02/2013.  . Carotid artery disease ([place])    a. mild-mod plaque, <50% stenosis bilat by duplex 2018.  [name] Coronary atherosclerosis of native coronary artery    a. Multivessel s/p CABG 1996. b. s/p DES x 2 SVG to PDA 8/12 with distal disease managed medically.  . DDD (degenerative disc disease)    Chronic back pain  . Enlarged prostate   . Essential hypertension   . Hematuria   . Hypothyroidism   . LBBB (left bundle branch block)   . MI (myocardial infarction) ([place])   . Mixed hyperlipidemia   . OA (osteoarthritis)   . OSA (obstructive sleep apnea)   . Pneumonia due to COVID-19 virus    February 2021  . Sinus bradycardia    a. Aricept and BB discontinued due to this.    Past Surgical History:  Procedure Laterality Date  . COLONOSCOPY  08/03/2004   Jenkins-numerous large diverticula in the descending, transverse, descending, and sigmoid colon. Otherwise normal exam.  . COLONOSCOPY  07/12/2012   RMR: External hemorrhoidal tag; multiple rectal and colonic polyps removed and/or treated as described above. Pancolonic diverticulosis. Bx-tubular adenomas, rectal hyperplastic polyp. next colonoscopy in 06/2015.  [name] COLONOSCOPY N/A 06/22/2016   Procedure: COLONOSCOPY;  Surgeon: Aviva Signs, MD;  [facility];  Service: Gastroenterology;  Laterality: N/A;  730  . CORONARY ANGIOPLASTY WITH STENT PLACEMENT    . CORONARY ARTERY BYPASS GRAFT  1996   LIMA to LAD, SVG to D2, SVG to PDA, SVG to OM1 and OM2  . CORONARY STENT INTERVENTION N/A 12/10/2019   Procedure: CORONARY STENT INTERVENTION;  Surgeon: Burnell Blanks, MD;  Location: [facility];  Service: Cardiovascular;  Laterality: N/A;  . ESOPHAGOGASTRODUODENOSCOPY N/A 03/16/2013   Procedure: ESOPHAGOGASTRODUODENOSCOPY (EGD);  Surgeon: Daneil Dolin, MD;  Location: [facility];  Service: Endoscopy;  Laterality: N/A;  12:00-moved to [place] notified pt  . ESOPHAGOGASTRODUODENOSCOPY N/A 03/06/2013   Procedure: ESOPHAGOGASTRODUODENOSCOPY (EGD);  Surgeon: Daneil Dolin, MD;  [facility]: [facility];  Service: Endoscopy;  Laterality: N/A;  . HERNIA REPAIR    .  INTRAVASCULAR ULTRASOUND/IVUS N/A 12/10/2019   Procedure: Intravascular Ultrasound/IVUS;  Surgeon: Burnell Blanks, MD;  Location: [facility];  Service: Cardiovascular;  Laterality: N/A;  . LEFT HEART CATH AND CORS/GRAFTS ANGIOGRAPHY N/A 12/10/2019   Procedure: LEFT HEART CATH AND CORS/GRAFTS ANGIOGRAPHY;  Surgeon: Burnell Blanks, MD;  Location: [facility];  Service: Cardiovascular;  Laterality: N/A;    Current Outpatient Medications  Medication Sig Dispense  Refill  . albuterol (PROVENTIL) (2.5 MG/3ML) 0.083% nebulizer solution Take 3 mLs (2.5 mg total) by nebulization every 6 (six) hours as needed for wheezing or shortness of breath. Patient will also need the nebulizer machine with this prescription. 75 mL 3  . albuterol (VENTOLIN HFA) 108 (90 Base) MCG/ACT inhaler Inhale 2 puffs into the lungs every 6 (six) hours as needed for wheezing or shortness of breath.    [name] aspirin EC 81 MG tablet Take 81 mg by mouth daily.    . busPIRone (BUSPAR) 10 MG tablet Take 10 mg by mouth 2 (two) times daily.     . clopidogrel (PLAVIX) 75 MG tablet Take 1 tablet (75 mg total) by mouth daily. 90 tablet 3  . dutasteride (AVODART) 0.5 MG capsule Take 0.5 mg by mouth. In the afternoon    . escitalopram (LEXAPRO) 20 MG tablet Take 20 mg by mouth every morning.     . furosemide (LASIX) 20 MG tablet Take 20 mg by mouth 2 (two) times daily.     . hydrOXYzine (ATARAX/VISTARIL) 25 MG tablet Take 25 mg by mouth 4 (four) times daily as needed for anxiety.     . isosorbide mononitrate (IMDUR) 30 MG 24 hr tablet Take 1 tablet (30 mg total) by mouth daily. 90 tablet 1  . levothyroxine (SYNTHROID, LEVOTHROID) 50 MCG tablet Take 50 mcg by mouth every morning.     . memantine (NAMENDA) 10 MG tablet Take 10 mg by mouth 2 (two) times daily.     . nitroGLYCERIN (NITROSTAT) 0.4 MG SL tablet Place 1 tablet (0.4 mg total) under the tongue every 5 (five) minutes as needed for chest pain. PLACE ONE TABLET UNDER TONGUE EVERY 5 MIN UP TO 3 DOSES AS NEEDED FORCHEST PAIN. 25 tablet 3  . oxycodone (ROXICODONE) 30 MG immediate release tablet Take 30 mg by mouth every 4 (four) hours as needed for pain. Take 1 tablet by mouth every 4 to 6 hours as needed for pain.  Max 8 /24 hours.    . potassium chloride SA (K-DUR,KLOR-CON) 20 MEQ tablet Take 20 mEq by mouth daily.     . QUEtiapine (SEROQUEL) 25 MG tablet Take 25 mg by mouth at bedtime.    . rosuvastatin (CRESTOR) 40 MG tablet Take 1 tablet (40 mg  total) by mouth daily at 6 PM. 30 tablet 0  . tamsulosin (FLOMAX) 0.4 MG CAPS capsule Take 1 capsule (0.4 mg total) by mouth daily. To help you pee. 30 capsule 3  . zinc sulfate 220 (50 Zn) MG capsule Take 1 capsule (220 mg total) by mouth daily. 30 capsule 1  . pantoprazole (PROTONIX) 40 MG tablet Take 1 tablet (40 mg total) by mouth every morning. 30 tablet 1   No current facility-administered medications for this visit.   Allergies:  Patient has no known allergies.   ROS:   Hearing and memory loss.  Physical Exam: VS:  BP 122/76   Pulse (!) 58   Temp 97.7 F (36.5 C)   Ht 5\' 10"  (1.778 m)  Wt 204 lb (92.5 kg)   SpO2 93%   BMI 29.27 kg/m , BMI Body mass index is 29.27 kg/m.  Wt Readings from Last 3 Encounters:  03/17/20 204 lb (92.5 kg)  02/14/20 195 lb 15.8 oz (88.9 kg)  01/14/20 210 lb (95.3 kg)    General: Elderly male, appears comfortable at rest. HEENT: Conjunctiva and lids normal, wearing a mask. Neck: Supple, no elevated JVP or carotid bruits, no thyromegaly. Lungs: Clear to auscultation, nonlabored breathing at rest. Cardiac: Regular rate and rhythm, no S3 or significant systolic murmur. Abdomen: Soft, nontender, bowel sounds present. Extremities: No pitting edema, distal pulses 2+.  ECG:  An ECG dated 02/12/2020 was personally reviewed today and demonstrated:  Sinus rhythm with incomplete left bundle branch block.  Recent Labwork: 12/10/2019: TSH 6.580 02/13/2020: B Natriuretic Peptide 44.0 02/16/2020: ALT 36; AST 41; BUN 21; Creatinine, Ser 1.14; Hemoglobin 11.6; Magnesium 2.1; Platelets 377; Potassium 3.1; Sodium 140     Component Value Date/Time   CHOL 143 12/11/2019 0438   TRIG 83 02/13/2020 0229   HDL 37 (L) 12/11/2019 0438   CHOLHDL 3.9 12/11/2019 0438   VLDL 29 12/11/2019 0438   LDLCALC 77 12/11/2019 0438    Other Studies Reviewed Today:  Cardiac catheterization 12/10/2019:  Origin lesion is 100% stenosed.  Mid Graft to Dist Graft lesion is  100% stenosed.  Mid RCA lesion is 100% stenosed.  Prox Graft lesion is 100% stenosed.  Mid LAD lesion is 99% stenosed.  Prox Cx to Mid Cx lesion is 100% stenosed.  SVG graft was visualized by angiography.  SVG graft was visualized by angiography.  Origin to Prox Graft lesion is 100% stenosed.  SVG graft was visualized by angiography.  LIMA graft was visualized by angiography.  Ost LM to Mid LM lesion is 90% stenosed.  A drug-eluting stent was successfully placed using a STENT RESOLUTE ONYX 4.5X18.  Post intervention, there is a 0% residual stenosis.   1. Severe triple vessel CAD s/p 5V CABG with 2/5 patent bypass grafts.  2. The left main artery has a moderate ostial stenosis and a severe mid stenosis.  3. The LAD has severe proximal to mid stenosis beyond the first large caliber diagonal branch. The LIMA to the LAD is patent. The vein graft to the second diagonal branch is occluded. The second diagonal branch fills from flow through the LIMA to the LAD. The first diagonal branch is not protected by the LIMA graft.  4. The ramus intermediate branch is a moderate caliber vessel and is not protected by grafts.  5. The Circumflex is a moderate caliber vessel with chronic mid occlusion. The sequential vein graft to OM1 and OM2 is patent to OM1 but the sequential segment of the graft to OM2 is occluded. OM2 fills from left to left collaterals.  6. The RCA is a large caliber vessel with chronic mid occlusion. The vein graft to the PDA is now occluded. The distal RCA and it's branches fill from left to right collaterals supplied through the LIMA to the LAD.  7. Successful PTCA/DES x 1 ostial to to distal left main artery.   Recommendations: Continue ASA and Plavix for lifetime given the left main stenting. Continue statin and Imdur. Anticipate discharge home tomorrow.   Echocardiogram 12/10/2019: 1. Left ventricular ejection fraction, by visual estimation, is 55 to  60%. The left  ventricle has normal function. There is no left ventricular  hypertrophy.  2. Left ventricular diastolic parameters are consistent with Grade  I  diastolic dysfunction (impaired relaxation).  3. Severely dilated left ventricular internal cavity size.  4. The left ventricle has no regional wall motion abnormalities.  5. No change in LVEF from echo done in 2019.  6. Global right ventricle has normal systolic function.The right  ventricular size is normal. No increase in right ventricular wall  thickness.  7. Left atrial size was mildly dilated.  8. Right atrial size was mildly dilated.  9. The mitral valve is normal in structure. Mild mitral valve  regurgitation.  10. The tricuspid valve is grossly normal. Tricuspid valve regurgitation  is trivial.  11. The aortic valve is tricuspid. Aortic valve regurgitation is not  visualized. Mild aortic valve sclerosis without stenosis.  12. The pulmonic valve was grossly normal. Pulmonic valve regurgitation is  trivial.  13. Normal pulmonary artery systolic pressure.  14. The interatrial septum was not assessed.   Assessment and Plan:  1.  Multivessel CAD status post CABG with subsequent graft intervention and most recently demonstration of only 2 of 5 bypass grafts patent and treatment with DES to the ostial to distal left main.  Reinforced continued use of dual antiplatelet therapy, he will resume aspirin 81 mg daily along with his Plavix.  Continue Crestor, also resuming Imdur although at lower dose 30 mg daily.  2.  Recent pneumonia due to COVID-19 in February.  Reports no fevers, cough, or shortness of breath at this time.  He is to follow-up with his PCP for repeat chest x-ray as noted in the discharge summary.  3.  Mixed hyperlipidemia, continues on Crestor.  Last LDL 77.  Medication Adjustments/Labs and Tests Ordered: Current medicines are reviewed at length with the patient today.  Concerns regarding medicines are outlined above.     Tests Ordered: No orders of the defined types were placed in this encounter.   Medication Changes: Meds ordered this encounter  Medications  . isosorbide mononitrate (IMDUR) 30 MG 24 hr tablet    Sig: Take 1 tablet (30 mg total) by mouth daily.    Dispense:  90 tablet    Refill:  1    03/17/20 dose decreased to 30 mg qd    Disposition:  Follow up 6 months in the [place] office.  Signed, Satira Sark, MD, [facility] 03/17/2020 1:21 PM    [facility] [address], [place], [place] [address] Phone: [phone]; Fax: [phone]

## 2020-03-19 DIAGNOSIS — G894 Chronic pain syndrome: Secondary | ICD-10-CM | POA: Diagnosis not present

## 2020-03-19 DIAGNOSIS — Z0001 Encounter for general adult medical examination with abnormal findings: Secondary | ICD-10-CM | POA: Diagnosis not present

## 2020-03-19 DIAGNOSIS — I10 Essential (primary) hypertension: Secondary | ICD-10-CM | POA: Diagnosis not present

## 2020-03-19 DIAGNOSIS — E663 Overweight: Secondary | ICD-10-CM | POA: Diagnosis not present

## 2020-03-19 DIAGNOSIS — K5900 Constipation, unspecified: Secondary | ICD-10-CM | POA: Diagnosis not present

## 2020-03-19 DIAGNOSIS — E7849 Other hyperlipidemia: Secondary | ICD-10-CM | POA: Diagnosis not present

## 2020-03-19 DIAGNOSIS — Z6829 Body mass index (BMI) 29.0-29.9, adult: Secondary | ICD-10-CM | POA: Diagnosis not present

## 2020-03-19 DIAGNOSIS — Z1389 Encounter for screening for other disorder: Secondary | ICD-10-CM | POA: Diagnosis not present

## 2020-03-19 DIAGNOSIS — M1991 Primary osteoarthritis, unspecified site: Secondary | ICD-10-CM | POA: Diagnosis not present

## 2020-03-26 DIAGNOSIS — J449 Chronic obstructive pulmonary disease, unspecified: Secondary | ICD-10-CM | POA: Diagnosis not present

## 2020-03-26 DIAGNOSIS — F0280 Dementia in other diseases classified elsewhere without behavioral disturbance: Secondary | ICD-10-CM | POA: Diagnosis not present

## 2020-03-26 DIAGNOSIS — G309 Alzheimer's disease, unspecified: Secondary | ICD-10-CM | POA: Diagnosis not present

## 2020-03-26 DIAGNOSIS — I10 Essential (primary) hypertension: Secondary | ICD-10-CM | POA: Diagnosis not present

## 2020-04-12 DIAGNOSIS — M25562 Pain in left knee: Secondary | ICD-10-CM | POA: Diagnosis not present

## 2020-04-12 DIAGNOSIS — M25572 Pain in left ankle and joints of left foot: Secondary | ICD-10-CM | POA: Diagnosis not present

## 2020-04-12 DIAGNOSIS — M25561 Pain in right knee: Secondary | ICD-10-CM | POA: Diagnosis not present

## 2020-04-14 DIAGNOSIS — I2510 Atherosclerotic heart disease of native coronary artery without angina pectoris: Secondary | ICD-10-CM | POA: Diagnosis not present

## 2020-04-14 DIAGNOSIS — E063 Autoimmune thyroiditis: Secondary | ICD-10-CM | POA: Diagnosis not present

## 2020-04-14 DIAGNOSIS — I10 Essential (primary) hypertension: Secondary | ICD-10-CM | POA: Diagnosis not present

## 2020-04-14 DIAGNOSIS — G894 Chronic pain syndrome: Secondary | ICD-10-CM | POA: Diagnosis not present

## 2020-04-14 DIAGNOSIS — Z6828 Body mass index (BMI) 28.0-28.9, adult: Secondary | ICD-10-CM | POA: Diagnosis not present

## 2020-04-14 DIAGNOSIS — R2689 Other abnormalities of gait and mobility: Secondary | ICD-10-CM | POA: Diagnosis not present

## 2020-04-14 DIAGNOSIS — S8002XA Contusion of left knee, initial encounter: Secondary | ICD-10-CM | POA: Diagnosis not present

## 2020-04-14 DIAGNOSIS — G64 Other disorders of peripheral nervous system: Secondary | ICD-10-CM | POA: Diagnosis not present

## 2020-04-16 DIAGNOSIS — J449 Chronic obstructive pulmonary disease, unspecified: Secondary | ICD-10-CM | POA: Diagnosis not present

## 2020-04-16 DIAGNOSIS — U071 COVID-19: Secondary | ICD-10-CM | POA: Diagnosis not present

## 2020-04-25 DIAGNOSIS — F0280 Dementia in other diseases classified elsewhere without behavioral disturbance: Secondary | ICD-10-CM | POA: Diagnosis not present

## 2020-04-25 DIAGNOSIS — N400 Enlarged prostate without lower urinary tract symptoms: Secondary | ICD-10-CM | POA: Diagnosis not present

## 2020-04-25 DIAGNOSIS — E119 Type 2 diabetes mellitus without complications: Secondary | ICD-10-CM | POA: Diagnosis not present

## 2020-04-25 DIAGNOSIS — Z7902 Long term (current) use of antithrombotics/antiplatelets: Secondary | ICD-10-CM | POA: Diagnosis not present

## 2020-04-25 DIAGNOSIS — J449 Chronic obstructive pulmonary disease, unspecified: Secondary | ICD-10-CM | POA: Diagnosis not present

## 2020-04-25 DIAGNOSIS — Z951 Presence of aortocoronary bypass graft: Secondary | ICD-10-CM | POA: Diagnosis not present

## 2020-04-25 DIAGNOSIS — I878 Other specified disorders of veins: Secondary | ICD-10-CM | POA: Diagnosis not present

## 2020-04-25 DIAGNOSIS — I2510 Atherosclerotic heart disease of native coronary artery without angina pectoris: Secondary | ICD-10-CM | POA: Diagnosis not present

## 2020-04-25 DIAGNOSIS — G629 Polyneuropathy, unspecified: Secondary | ICD-10-CM | POA: Diagnosis not present

## 2020-04-25 DIAGNOSIS — M48061 Spinal stenosis, lumbar region without neurogenic claudication: Secondary | ICD-10-CM | POA: Diagnosis not present

## 2020-04-25 DIAGNOSIS — G309 Alzheimer's disease, unspecified: Secondary | ICD-10-CM | POA: Diagnosis not present

## 2020-04-25 DIAGNOSIS — I10 Essential (primary) hypertension: Secondary | ICD-10-CM | POA: Diagnosis not present

## 2020-04-25 DIAGNOSIS — E039 Hypothyroidism, unspecified: Secondary | ICD-10-CM | POA: Diagnosis not present

## 2020-04-25 DIAGNOSIS — Z6829 Body mass index (BMI) 29.0-29.9, adult: Secondary | ICD-10-CM | POA: Diagnosis not present

## 2020-04-25 DIAGNOSIS — F17200 Nicotine dependence, unspecified, uncomplicated: Secondary | ICD-10-CM | POA: Diagnosis not present

## 2020-04-25 DIAGNOSIS — F341 Dysthymic disorder: Secondary | ICD-10-CM | POA: Diagnosis not present

## 2020-04-25 DIAGNOSIS — K219 Gastro-esophageal reflux disease without esophagitis: Secondary | ICD-10-CM | POA: Diagnosis not present

## 2020-04-25 DIAGNOSIS — F1120 Opioid dependence, uncomplicated: Secondary | ICD-10-CM | POA: Diagnosis not present

## 2020-04-25 DIAGNOSIS — G894 Chronic pain syndrome: Secondary | ICD-10-CM | POA: Diagnosis not present

## 2020-04-25 DIAGNOSIS — E663 Overweight: Secondary | ICD-10-CM | POA: Diagnosis not present

## 2020-04-25 DIAGNOSIS — E063 Autoimmune thyroiditis: Secondary | ICD-10-CM | POA: Diagnosis not present

## 2020-04-25 DIAGNOSIS — H9190 Unspecified hearing loss, unspecified ear: Secondary | ICD-10-CM | POA: Diagnosis not present

## 2020-04-25 DIAGNOSIS — E782 Mixed hyperlipidemia: Secondary | ICD-10-CM | POA: Diagnosis not present

## 2020-04-25 DIAGNOSIS — K5909 Other constipation: Secondary | ICD-10-CM | POA: Diagnosis not present

## 2020-04-26 ENCOUNTER — Emergency Department (HOSPITAL_COMMUNITY)
Admission: EM | Admit: 2020-04-26 | Discharge: 2020-04-26 | Disposition: A | Discharge status: Home / Self Care | Payer: PPO | Attending: Emergency Medicine | Admitting: Emergency Medicine

## 2020-04-26 ENCOUNTER — Emergency Department (HOSPITAL_COMMUNITY): Payer: PPO

## 2020-04-26 ENCOUNTER — Other Ambulatory Visit: Payer: Self-pay

## 2020-04-26 ENCOUNTER — Encounter (HOSPITAL_COMMUNITY): Payer: Self-pay

## 2020-04-26 DIAGNOSIS — T50Z95A Adverse effect of other vaccines and biological substances, initial encounter: Secondary | ICD-10-CM | POA: Insufficient documentation

## 2020-04-26 DIAGNOSIS — I2510 Atherosclerotic heart disease of native coronary artery without angina pectoris: Secondary | ICD-10-CM | POA: Insufficient documentation

## 2020-04-26 DIAGNOSIS — Z743 Need for continuous supervision: Secondary | ICD-10-CM | POA: Diagnosis not present

## 2020-04-26 DIAGNOSIS — E039 Hypothyroidism, unspecified: Secondary | ICD-10-CM | POA: Insufficient documentation

## 2020-04-26 DIAGNOSIS — R509 Fever, unspecified: Secondary | ICD-10-CM | POA: Diagnosis not present

## 2020-04-26 DIAGNOSIS — I5032 Chronic diastolic (congestive) heart failure: Secondary | ICD-10-CM | POA: Insufficient documentation

## 2020-04-26 DIAGNOSIS — R52 Pain, unspecified: Secondary | ICD-10-CM | POA: Diagnosis not present

## 2020-04-26 DIAGNOSIS — I110 Hypertensive heart disease with heart failure: Secondary | ICD-10-CM | POA: Insufficient documentation

## 2020-04-26 DIAGNOSIS — Z87891 Personal history of nicotine dependence: Secondary | ICD-10-CM | POA: Diagnosis not present

## 2020-04-26 DIAGNOSIS — R069 Unspecified abnormalities of breathing: Secondary | ICD-10-CM | POA: Diagnosis not present

## 2020-04-26 DIAGNOSIS — R0689 Other abnormalities of breathing: Secondary | ICD-10-CM | POA: Diagnosis not present

## 2020-04-26 DIAGNOSIS — I10 Essential (primary) hypertension: Secondary | ICD-10-CM | POA: Diagnosis not present

## 2020-04-26 DIAGNOSIS — R404 Transient alteration of awareness: Secondary | ICD-10-CM | POA: Diagnosis not present

## 2020-04-26 LAB — CBC WITH DIFFERENTIAL/PLATELET
Abs Immature Granulocytes: 0.02 10*3/uL (ref 0.00–0.07)
Basophils Absolute: 0 10*3/uL (ref 0.0–0.1)
Basophils Relative: 1 %
Eosinophils Absolute: 0.1 10*3/uL (ref 0.0–0.5)
Eosinophils Relative: 1 %
HCT: 38.1 % — ABNORMAL LOW (ref 39.0–52.0)
Hemoglobin: 12.4 g/dL — ABNORMAL LOW (ref 13.0–17.0)
Immature Granulocytes: 0 %
Lymphocytes Relative: 10 %
Lymphs Abs: 0.8 10*3/uL (ref 0.7–4.0)
MCH: 29.5 pg (ref 26.0–34.0)
MCHC: 32.5 g/dL (ref 30.0–36.0)
MCV: 90.7 fL (ref 80.0–100.0)
Monocytes Absolute: 0.5 10*3/uL (ref 0.1–1.0)
Monocytes Relative: 6 %
Neutro Abs: 6.7 10*3/uL (ref 1.7–7.7)
Neutrophils Relative %: 82 %
Platelets: 199 10*3/uL (ref 150–400)
RBC: 4.2 MIL/uL — ABNORMAL LOW (ref 4.22–5.81)
RDW: 14.1 % (ref 11.5–15.5)
WBC: 8.1 10*3/uL (ref 4.0–10.5)
nRBC: 0 % (ref 0.0–0.2)

## 2020-04-26 LAB — COMPREHENSIVE METABOLIC PANEL
ALT: 17 U/L (ref 0–44)
AST: 21 U/L (ref 15–41)
Albumin: 3.8 g/dL (ref 3.5–5.0)
Alkaline Phosphatase: 54 U/L (ref 38–126)
Anion gap: 11 (ref 5–15)
BUN: 18 mg/dL (ref 8–23)
CO2: 26 mmol/L (ref 22–32)
Calcium: 8.9 mg/dL (ref 8.9–10.3)
Chloride: 95 mmol/L — ABNORMAL LOW (ref 98–111)
Creatinine, Ser: 0.99 mg/dL (ref 0.61–1.24)
GFR calc Af Amer: >60 mL/min (ref >60)
GFR calc non Af Amer: >60 mL/min (ref >60)
Glucose, Bld: 133 mg/dL — ABNORMAL HIGH (ref 70–99)
Potassium: 3.9 mmol/L (ref 3.5–5.1)
Sodium: 132 mmol/L — ABNORMAL LOW (ref 135–145)
Total Bilirubin: 1.1 mg/dL (ref 0.3–1.2)
Total Protein: 6.8 g/dL (ref 6.5–8.1)

## 2020-04-26 LAB — URINALYSIS, ROUTINE W REFLEX MICROSCOPIC
Bacteria, UA: NONE SEEN
Bilirubin Urine: NEGATIVE
Glucose, UA: NEGATIVE mg/dL
Ketones, ur: NEGATIVE mg/dL
Leukocytes,Ua: NEGATIVE
Nitrite: NEGATIVE
Protein, ur: NEGATIVE mg/dL
Specific Gravity, Urine: 1.011 (ref 1.005–1.030)
pH: 6 (ref 5.0–8.0)

## 2020-04-26 MED ORDER — ACETAMINOPHEN 500 MG PO TABS
1000.0000 mg | ORAL_TABLET | Freq: Once | ORAL | Status: AC
  Administered 2020-04-26: 1000 mg via ORAL
  Filled 2020-04-26: qty 2

## 2020-04-26 MED ORDER — IBUPROFEN 400 MG PO TABS: 400.0000 mg | ORAL_TABLET | Freq: Once | ORAL | Status: DC

## 2020-04-26 NOTE — Discharge Instructions (Addendum)
Your work-up in the emergency room was unremarkable.  I think that your fever, body aches and feeling ill was likely from your vaccine.  This is very common with this.  I do not see any evidence of bacterial infection that we need to image to the hospital for.  You can control your symptoms with Tylenol at home.  If you have persistent symptoms for longer than 24 hours then please return to the emergency room.  If you have worsening symptoms (as you are symptom-free at this time) then please return to emergency department for reevaluation.

## 2020-04-26 NOTE — ED Triage Notes (Signed)
EMS brought pt for c/o fever and body aches all over that started 3 hours ago- pt had 2nd covid shot yesterday. Pt reports aches and pain all over, "even in his fingers". Pt skin hot to touch.

## 2020-04-27 NOTE — ED Provider Notes (Signed)
Emergency Department Provider Note   I have reviewed the triage vital signs and the nursing notes.   HISTORY  Chief Complaint Fever (had 2nd covid shot yesterday)   HPI Dustin [name] is a 86 y.o. male who presents to the emerge department today with body aches and fever started around 11:00 tonight.  Patient states that he had a second dose of his Covid vaccine around 11:00 in the morning.  Patient states that he does not have any urinary symptoms or respiratory symptoms.  States he just felt really bad so presented here for further evaluation.  At this time patient states he is asymptomatic.   No other associated or modifying symptoms.    Past Medical History:  Diagnosis Date  . Alzheimer disease ([place])   . Anxiety   . Arthritis   . Atrophic gastritis    a. By EGD 02/2013.  . Carotid artery disease ([place])    a. mild-mod plaque, <50% stenosis bilat by duplex 2018.  [name] Coronary atherosclerosis of native coronary artery    a. Multivessel s/p CABG 1996. b. s/p DES x 2 SVG to PDA 8/12 with distal disease managed medically.  . DDD (degenerative disc disease)    Chronic back pain  . Enlarged prostate   . Essential hypertension   . Hematuria   . Hypothyroidism   . LBBB (left bundle branch block)   . MI (myocardial infarction) ([place])   . Mixed hyperlipidemia   . OA (osteoarthritis)   . OSA (obstructive sleep apnea)   . Pneumonia due to COVID-19 virus    February 2021  . Sinus bradycardia    a. Aricept and BB discontinued due to this.    Patient Active Problem List   Diagnosis Date Noted  . Pneumonia due to COVID-19 virus 02/13/2020  . COPD (chronic obstructive pulmonary disease) ([place]) 12/10/2019  . COPD exacerbation ([place]) 12/07/2019  . Unstable angina ([place]) 11/07/2018  . Fall 11/07/2018  . Gait instability 11/07/2018  . Leukocytosis 11/07/2018  . Depression 11/07/2018  . BPH (benign prostatic hyperplasia) 11/07/2018  . Chronic pain 11/07/2018  . Pressure injury of  skin 02/15/2017  . Chronic diastolic CHF (congestive heart failure) ([place]) 12/24/2016  . Sinus bradycardia 12/23/2016  . Foot pain, bilateral 12/23/2016  . Hypokalemia 04/05/2016  . Acute respiratory failure ([place]) 04/03/2016  . Acute encephalopathy 04/03/2016  . Hypothyroidism 04/03/2016  . Aspiration pneumonia ([place]) 04/03/2016  . Dementia ([place]) 04/03/2016  . Acute respiratory failure with hypoxia ([place]) 04/03/2016  . Memory loss 04/20/2015  . Hypoxia 01/27/2015  . CAP (community acquired pneumonia) 01/27/2015  . Fever 08/30/2014  . Healthcare associated bacterial pneumonia 08/30/2014  . Toxic Metabolic encephalopathy 99991111  . Sepsis ([place]) 08/30/2014  . Neck pain on right side 08/29/2014  . Neck pain 08/29/2014  . Left-sided weakness 06/28/2014  . Chest pain 06/27/2014  . Tubular adenoma of colon 03/05/2013  . Anorexia 03/05/2013  . Loss of weight 03/05/2013  . Chronic constipation 07/04/2012  . Bronchitis 08/16/2011  . Hyperlipidemia 10/01/2009  . OSA (obstructive sleep apnea) 10/01/2009  . Alzheimer disease ([place]) 10/01/2009  . HYPERTENSION, BENIGN 10/01/2009  . Coronary artery disease 10/01/2009    Past Surgical History:  Procedure Laterality Date  . COLONOSCOPY  08/03/2004   Jenkins-numerous large diverticula in the descending, transverse, descending, and sigmoid colon. Otherwise normal exam.  . COLONOSCOPY  07/12/2012   RMR: External hemorrhoidal tag; multiple rectal and colonic polyps removed and/or treated as described above. Pancolonic diverticulosis. Bx-tubular  adenomas, rectal hyperplastic polyp. next colonoscopy in 06/2015.  [name] COLONOSCOPY N/A 06/22/2016   Procedure: COLONOSCOPY;  Surgeon: Aviva Signs, MD;  [facility];  Service: Gastroenterology;  Laterality: N/A;  730  . CORONARY ANGIOPLASTY WITH STENT PLACEMENT    . CORONARY ARTERY BYPASS GRAFT  1996   LIMA to LAD, SVG to D2, SVG to PDA, SVG to OM1 and OM2  . CORONARY STENT INTERVENTION N/A  12/10/2019   Procedure: CORONARY STENT INTERVENTION;  Surgeon: Burnell Blanks, MD;  Location: [facility];  Service: Cardiovascular;  Laterality: N/A;  . ESOPHAGOGASTRODUODENOSCOPY N/A 03/16/2013   Procedure: ESOPHAGOGASTRODUODENOSCOPY (EGD);  Surgeon: Daneil Dolin, MD;  [facility];  Service: Endoscopy;  Laterality: N/A;  12:00-moved to [place] notified pt  . ESOPHAGOGASTRODUODENOSCOPY N/A 03/06/2013   Procedure: ESOPHAGOGASTRODUODENOSCOPY (EGD);  Surgeon: Daneil Dolin, MD;  [facility];  Service: Endoscopy;  Laterality: N/A;  . HERNIA REPAIR    . INTRAVASCULAR ULTRASOUND/IVUS N/A 12/10/2019   Procedure: Intravascular Ultrasound/IVUS;  Surgeon: Burnell Blanks, MD;  Location: [facility];  Service: Cardiovascular;  Laterality: N/A;  . LEFT HEART CATH AND CORS/GRAFTS ANGIOGRAPHY N/A 12/10/2019   Procedure: LEFT HEART CATH AND CORS/GRAFTS ANGIOGRAPHY;  Surgeon: Burnell Blanks, MD;  Location: [facility];  Service: Cardiovascular;  Laterality: N/A;    Current Outpatient Rx  . Order #: [id number] Class: Print  . Order #: [id number] Class: Historical Med  . Order #: [id number] Class: Historical Med  . Order #: [id number] Class: Historical Med  . Order #: [id number] Class: Normal  . Order #: [id number] Class: Historical Med  . Order #: [id number] Class: Historical Med  . Order #: [id number] Class: Historical Med  . Order #: [id number] Class: Historical Med  . Order #: [id number] Class: Normal  . Order #: [id number] Class: Historical Med  . Order #: [id number] Class: Historical Med  . Order #: [id number] Class: No Print  . Order #: [id number] Class: Historical Med  . Order #: [id number] Class: Normal  . Order #: [id number] Class: Historical Med  . Order #: [id number] Class: Historical Med  . Order #: [id number] Class: Phone In  . Order #: [id number] Class: Normal  . Order #: [id number] Class: Normal    Allergies Patient has no known  allergies.  Family History  Problem Relation Age of Onset  . Heart disease Other   . Heart attack Mother   . Colon cancer Neg Hx     Social History Social History   Tobacco Use  . Smoking status: Former Smoker    Packs/day: 2.00    Years: 40.00    Pack years: 80.00    Types: Cigarettes    Quit date: 12/27/1994    Years since quitting: 25.3  . Smokeless tobacco: Never Used  Substance Use Topics  . Alcohol use: No    Alcohol/week: 0.0 standard drinks  . Drug use: No    Review of Systems  All other systems negative except as documented in the HPI. All pertinent positives and negatives as reviewed in the HPI. ____________________________________________   PHYSICAL EXAM:  VITAL SIGNS: ED Triage Vitals  Enc Vitals Group     BP 04/26/20 0500 (!) 143/60     Pulse Rate 04/26/20 0500 66     Resp 04/26/20 0500 14     Temp 04/26/20 0600 (!) 101.9 F (38.8 C)     Temp Source 04/26/20 0600 Rectal     SpO2 04/26/20 0500 95 %     Weight 04/26/20 0446 203 lb 14.8 oz (92.5  kg)     Height 04/26/20 0446 5\' 10"  (1.778 m)    Constitutional: Alert and oriented. Well appearing and in no acute distress. Eyes: Conjunctivae are normal. PERRL. EOMI. Head: Atraumatic. Nose: No congestion/rhinnorhea. Mouth/Throat: Mucous membranes are moist.  Oropharynx non-erythematous. Neck: No stridor.  No meningeal signs.   Cardiovascular: Normal rate, regular rhythm. Good peripheral circulation. Grossly normal heart sounds.   Respiratory: Normal respiratory effort.  No retractions. Lungs CTAB. Gastrointestinal: Soft and nontender. No distention.  Musculoskeletal: No lower extremity tenderness nor edema. No gross deformities of extremities. Neurologic:  Normal speech and language. No gross focal neurologic deficits are appreciated.  Skin:  Skin is warm, dry and intact. No rash noted.  ____________________________________________   LABS (all labs ordered are listed, but only abnormal results are  displayed)  Labs Reviewed  CBC WITH DIFFERENTIAL/PLATELET - Abnormal; Notable for the following components:      Result Value   RBC 4.20 (*)    Hemoglobin 12.4 (*)    HCT 38.1 (*)    All other components within normal limits  COMPREHENSIVE METABOLIC PANEL - Abnormal; Notable for the following components:   Sodium 132 (*)    Chloride 95 (*)    Glucose, Bld 133 (*)    All other components within normal limits  URINALYSIS, ROUTINE W REFLEX MICROSCOPIC - Abnormal; Notable for the following components:   Hgb urine dipstick SMALL (*)    All other components within normal limits   ____________________________________________  EKG   EKG Interpretation  Date/Time:  Saturday Apr 26 2020 04:50:14 EDT Ventricular Rate:  68 PR Interval:    QRS Duration: 133 QT Interval:  435 QTC Calculation: 463 R Axis:   39 Text Interpretation: Sinus rhythm Atrial premature complex Nonspecific intraventricular conduction delay Borderline repolarization abnormality Minimal ST elevation, anterior leads No significant change since last tracing Confirmed by Merrily Pew [id number]) on 04/26/2020 5:11:38 AM       ____________________________________________  RADIOLOGY  DG Chest 2 View  Result Date: 04/26/2020 CLINICAL DATA:  Fever and body aches EXAM: CHEST - 2 VIEW COMPARISON:  02/13/2020 FINDINGS: Moderate cardiomegaly. Remote median sternotomy and CABG. No focal airspace consolidation or pulmonary edema. No pneumothorax or sizable pleural effusion. IMPRESSION: No active cardiopulmonary disease. Electronically Signed   By: Ulyses Jarred M.D.   On: 04/26/2020 06:54    ____________________________________________   PROCEDURES  Procedure(s) performed:   Procedures   ____________________________________________   INITIAL IMPRESSION / ASSESSMENT AND PLAN / ED COURSE  Patient is asymptomatic at this time after defervesced since with the Tylenol.  I suspect this was known postvaccination reaction.   Chest x-ray, urinalysis and rest of labs are unremarkable.  Low concern for sepsis at this time.  Patient is asymptomatic and wishes to go home.  I think this is okay rather than observation if anything changes or worsens will come back.     Pertinent labs & imaging results that were available during my care of the patient were reviewed by me and considered in my medical decision making (see chart for details).  A medical screening exam was performed and I feel the patient has had an appropriate workup for their chief complaint at this time and likelihood of emergent condition existing is low. They have been counseled on decision, discharge, follow up and which symptoms necessitate immediate return to the emergency department. They or their family verbally stated understanding and agreement with plan and discharged in stable condition.   ____________________________________________  FINAL CLINICAL  IMPRESSION(S) / ED DIAGNOSES  Final diagnoses:  Adverse effect of vaccine, initial encounter     MEDICATIONS GIVEN DURING THIS VISIT:  Medications  acetaminophen (TYLENOL) tablet 1,000 mg (1,000 mg Oral Given 04/26/20 0502)     NEW OUTPATIENT MEDICATIONS STARTED DURING THIS VISIT:  Discharge Medication List as of 04/26/2020  7:02 AM      Note:  This note was prepared with assistance of Dragon voice recognition software. Occasional wrong-word or sound-a-like substitutions may have occurred due to the inherent limitations of voice recognition software.   Yanisa Goodgame, Corene Cornea, MD 04/27/20 0200

## 2020-04-30 DIAGNOSIS — N400 Enlarged prostate without lower urinary tract symptoms: Secondary | ICD-10-CM | POA: Diagnosis not present

## 2020-04-30 DIAGNOSIS — G629 Polyneuropathy, unspecified: Secondary | ICD-10-CM | POA: Diagnosis not present

## 2020-04-30 DIAGNOSIS — G309 Alzheimer's disease, unspecified: Secondary | ICD-10-CM | POA: Diagnosis not present

## 2020-04-30 DIAGNOSIS — H9190 Unspecified hearing loss, unspecified ear: Secondary | ICD-10-CM | POA: Diagnosis not present

## 2020-04-30 DIAGNOSIS — Z6829 Body mass index (BMI) 29.0-29.9, adult: Secondary | ICD-10-CM | POA: Diagnosis not present

## 2020-04-30 DIAGNOSIS — G894 Chronic pain syndrome: Secondary | ICD-10-CM | POA: Diagnosis not present

## 2020-04-30 DIAGNOSIS — I2510 Atherosclerotic heart disease of native coronary artery without angina pectoris: Secondary | ICD-10-CM | POA: Diagnosis not present

## 2020-04-30 DIAGNOSIS — E663 Overweight: Secondary | ICD-10-CM | POA: Diagnosis not present

## 2020-04-30 DIAGNOSIS — I878 Other specified disorders of veins: Secondary | ICD-10-CM | POA: Diagnosis not present

## 2020-04-30 DIAGNOSIS — Z7902 Long term (current) use of antithrombotics/antiplatelets: Secondary | ICD-10-CM | POA: Diagnosis not present

## 2020-04-30 DIAGNOSIS — E119 Type 2 diabetes mellitus without complications: Secondary | ICD-10-CM | POA: Diagnosis not present

## 2020-04-30 DIAGNOSIS — Z951 Presence of aortocoronary bypass graft: Secondary | ICD-10-CM | POA: Diagnosis not present

## 2020-04-30 DIAGNOSIS — M48061 Spinal stenosis, lumbar region without neurogenic claudication: Secondary | ICD-10-CM | POA: Diagnosis not present

## 2020-04-30 DIAGNOSIS — K219 Gastro-esophageal reflux disease without esophagitis: Secondary | ICD-10-CM | POA: Diagnosis not present

## 2020-04-30 DIAGNOSIS — J449 Chronic obstructive pulmonary disease, unspecified: Secondary | ICD-10-CM | POA: Diagnosis not present

## 2020-04-30 DIAGNOSIS — F17200 Nicotine dependence, unspecified, uncomplicated: Secondary | ICD-10-CM | POA: Diagnosis not present

## 2020-04-30 DIAGNOSIS — E039 Hypothyroidism, unspecified: Secondary | ICD-10-CM | POA: Diagnosis not present

## 2020-04-30 DIAGNOSIS — E063 Autoimmune thyroiditis: Secondary | ICD-10-CM | POA: Diagnosis not present

## 2020-04-30 DIAGNOSIS — F1120 Opioid dependence, uncomplicated: Secondary | ICD-10-CM | POA: Diagnosis not present

## 2020-04-30 DIAGNOSIS — E782 Mixed hyperlipidemia: Secondary | ICD-10-CM | POA: Diagnosis not present

## 2020-04-30 DIAGNOSIS — F0280 Dementia in other diseases classified elsewhere without behavioral disturbance: Secondary | ICD-10-CM | POA: Diagnosis not present

## 2020-04-30 DIAGNOSIS — I10 Essential (primary) hypertension: Secondary | ICD-10-CM | POA: Diagnosis not present

## 2020-04-30 DIAGNOSIS — F341 Dysthymic disorder: Secondary | ICD-10-CM | POA: Diagnosis not present

## 2020-04-30 DIAGNOSIS — K5909 Other constipation: Secondary | ICD-10-CM | POA: Diagnosis not present

## 2020-05-12 DIAGNOSIS — F0280 Dementia in other diseases classified elsewhere without behavioral disturbance: Secondary | ICD-10-CM | POA: Diagnosis not present

## 2020-05-12 DIAGNOSIS — H9190 Unspecified hearing loss, unspecified ear: Secondary | ICD-10-CM | POA: Diagnosis not present

## 2020-05-12 DIAGNOSIS — J449 Chronic obstructive pulmonary disease, unspecified: Secondary | ICD-10-CM | POA: Diagnosis not present

## 2020-05-12 DIAGNOSIS — Z951 Presence of aortocoronary bypass graft: Secondary | ICD-10-CM | POA: Diagnosis not present

## 2020-05-12 DIAGNOSIS — I878 Other specified disorders of veins: Secondary | ICD-10-CM | POA: Diagnosis not present

## 2020-05-12 DIAGNOSIS — E119 Type 2 diabetes mellitus without complications: Secondary | ICD-10-CM | POA: Diagnosis not present

## 2020-05-12 DIAGNOSIS — E663 Overweight: Secondary | ICD-10-CM | POA: Diagnosis not present

## 2020-05-12 DIAGNOSIS — Z6829 Body mass index (BMI) 29.0-29.9, adult: Secondary | ICD-10-CM | POA: Diagnosis not present

## 2020-05-12 DIAGNOSIS — Z7902 Long term (current) use of antithrombotics/antiplatelets: Secondary | ICD-10-CM | POA: Diagnosis not present

## 2020-05-12 DIAGNOSIS — I10 Essential (primary) hypertension: Secondary | ICD-10-CM | POA: Diagnosis not present

## 2020-05-12 DIAGNOSIS — M48061 Spinal stenosis, lumbar region without neurogenic claudication: Secondary | ICD-10-CM | POA: Diagnosis not present

## 2020-05-12 DIAGNOSIS — F1120 Opioid dependence, uncomplicated: Secondary | ICD-10-CM | POA: Diagnosis not present

## 2020-05-12 DIAGNOSIS — F341 Dysthymic disorder: Secondary | ICD-10-CM | POA: Diagnosis not present

## 2020-05-12 DIAGNOSIS — G894 Chronic pain syndrome: Secondary | ICD-10-CM | POA: Diagnosis not present

## 2020-05-12 DIAGNOSIS — I2510 Atherosclerotic heart disease of native coronary artery without angina pectoris: Secondary | ICD-10-CM | POA: Diagnosis not present

## 2020-05-12 DIAGNOSIS — N400 Enlarged prostate without lower urinary tract symptoms: Secondary | ICD-10-CM | POA: Diagnosis not present

## 2020-05-12 DIAGNOSIS — E782 Mixed hyperlipidemia: Secondary | ICD-10-CM | POA: Diagnosis not present

## 2020-05-12 DIAGNOSIS — K5909 Other constipation: Secondary | ICD-10-CM | POA: Diagnosis not present

## 2020-05-12 DIAGNOSIS — E039 Hypothyroidism, unspecified: Secondary | ICD-10-CM | POA: Diagnosis not present

## 2020-05-12 DIAGNOSIS — F17200 Nicotine dependence, unspecified, uncomplicated: Secondary | ICD-10-CM | POA: Diagnosis not present

## 2020-05-12 DIAGNOSIS — G309 Alzheimer's disease, unspecified: Secondary | ICD-10-CM | POA: Diagnosis not present

## 2020-05-12 DIAGNOSIS — E063 Autoimmune thyroiditis: Secondary | ICD-10-CM | POA: Diagnosis not present

## 2020-05-12 DIAGNOSIS — G629 Polyneuropathy, unspecified: Secondary | ICD-10-CM | POA: Diagnosis not present

## 2020-05-12 DIAGNOSIS — K219 Gastro-esophageal reflux disease without esophagitis: Secondary | ICD-10-CM | POA: Diagnosis not present

## 2020-05-16 DIAGNOSIS — J449 Chronic obstructive pulmonary disease, unspecified: Secondary | ICD-10-CM | POA: Diagnosis not present

## 2020-05-16 DIAGNOSIS — U071 COVID-19: Secondary | ICD-10-CM | POA: Diagnosis not present

## 2020-05-28 DIAGNOSIS — F17200 Nicotine dependence, unspecified, uncomplicated: Secondary | ICD-10-CM | POA: Diagnosis not present

## 2020-05-28 DIAGNOSIS — G894 Chronic pain syndrome: Secondary | ICD-10-CM | POA: Diagnosis not present

## 2020-05-28 DIAGNOSIS — J449 Chronic obstructive pulmonary disease, unspecified: Secondary | ICD-10-CM | POA: Diagnosis not present

## 2020-05-28 DIAGNOSIS — G629 Polyneuropathy, unspecified: Secondary | ICD-10-CM | POA: Diagnosis not present

## 2020-05-28 DIAGNOSIS — E782 Mixed hyperlipidemia: Secondary | ICD-10-CM | POA: Diagnosis not present

## 2020-05-28 DIAGNOSIS — F1120 Opioid dependence, uncomplicated: Secondary | ICD-10-CM | POA: Diagnosis not present

## 2020-05-28 DIAGNOSIS — E119 Type 2 diabetes mellitus without complications: Secondary | ICD-10-CM | POA: Diagnosis not present

## 2020-05-28 DIAGNOSIS — Z951 Presence of aortocoronary bypass graft: Secondary | ICD-10-CM | POA: Diagnosis not present

## 2020-05-28 DIAGNOSIS — F0280 Dementia in other diseases classified elsewhere without behavioral disturbance: Secondary | ICD-10-CM | POA: Diagnosis not present

## 2020-05-28 DIAGNOSIS — G309 Alzheimer's disease, unspecified: Secondary | ICD-10-CM | POA: Diagnosis not present

## 2020-05-28 DIAGNOSIS — I2510 Atherosclerotic heart disease of native coronary artery without angina pectoris: Secondary | ICD-10-CM | POA: Diagnosis not present

## 2020-05-28 DIAGNOSIS — N400 Enlarged prostate without lower urinary tract symptoms: Secondary | ICD-10-CM | POA: Diagnosis not present

## 2020-05-28 DIAGNOSIS — K219 Gastro-esophageal reflux disease without esophagitis: Secondary | ICD-10-CM | POA: Diagnosis not present

## 2020-05-28 DIAGNOSIS — I10 Essential (primary) hypertension: Secondary | ICD-10-CM | POA: Diagnosis not present

## 2020-05-28 DIAGNOSIS — F341 Dysthymic disorder: Secondary | ICD-10-CM | POA: Diagnosis not present

## 2020-05-28 DIAGNOSIS — E663 Overweight: Secondary | ICD-10-CM | POA: Diagnosis not present

## 2020-05-28 DIAGNOSIS — E039 Hypothyroidism, unspecified: Secondary | ICD-10-CM | POA: Diagnosis not present

## 2020-05-28 DIAGNOSIS — Z6829 Body mass index (BMI) 29.0-29.9, adult: Secondary | ICD-10-CM | POA: Diagnosis not present

## 2020-05-28 DIAGNOSIS — I878 Other specified disorders of veins: Secondary | ICD-10-CM | POA: Diagnosis not present

## 2020-05-28 DIAGNOSIS — K5909 Other constipation: Secondary | ICD-10-CM | POA: Diagnosis not present

## 2020-05-28 DIAGNOSIS — M48061 Spinal stenosis, lumbar region without neurogenic claudication: Secondary | ICD-10-CM | POA: Diagnosis not present

## 2020-05-28 DIAGNOSIS — E063 Autoimmune thyroiditis: Secondary | ICD-10-CM | POA: Diagnosis not present

## 2020-05-28 DIAGNOSIS — H9190 Unspecified hearing loss, unspecified ear: Secondary | ICD-10-CM | POA: Diagnosis not present

## 2020-05-28 DIAGNOSIS — Z7902 Long term (current) use of antithrombotics/antiplatelets: Secondary | ICD-10-CM | POA: Diagnosis not present

## 2020-06-06 DIAGNOSIS — E039 Hypothyroidism, unspecified: Secondary | ICD-10-CM | POA: Diagnosis not present

## 2020-06-12 ENCOUNTER — Emergency Department (HOSPITAL_COMMUNITY)
Admission: EM | Admit: 2020-06-12 | Discharge: 2020-06-12 | Disposition: A | Discharge status: Home / Self Care | Payer: PPO | Attending: Emergency Medicine | Admitting: Emergency Medicine

## 2020-06-12 ENCOUNTER — Other Ambulatory Visit: Payer: Self-pay

## 2020-06-12 ENCOUNTER — Encounter (HOSPITAL_COMMUNITY): Payer: Self-pay | Admitting: *Deleted

## 2020-06-12 ENCOUNTER — Emergency Department (HOSPITAL_COMMUNITY): Payer: PPO

## 2020-06-12 DIAGNOSIS — E039 Hypothyroidism, unspecified: Secondary | ICD-10-CM | POA: Insufficient documentation

## 2020-06-12 DIAGNOSIS — Z7951 Long term (current) use of inhaled steroids: Secondary | ICD-10-CM | POA: Diagnosis not present

## 2020-06-12 DIAGNOSIS — I2510 Atherosclerotic heart disease of native coronary artery without angina pectoris: Secondary | ICD-10-CM | POA: Insufficient documentation

## 2020-06-12 DIAGNOSIS — Z7982 Long term (current) use of aspirin: Secondary | ICD-10-CM | POA: Diagnosis not present

## 2020-06-12 DIAGNOSIS — Z951 Presence of aortocoronary bypass graft: Secondary | ICD-10-CM | POA: Insufficient documentation

## 2020-06-12 DIAGNOSIS — I517 Cardiomegaly: Secondary | ICD-10-CM | POA: Diagnosis not present

## 2020-06-12 DIAGNOSIS — J441 Chronic obstructive pulmonary disease with (acute) exacerbation: Secondary | ICD-10-CM | POA: Insufficient documentation

## 2020-06-12 DIAGNOSIS — Z87891 Personal history of nicotine dependence: Secondary | ICD-10-CM | POA: Insufficient documentation

## 2020-06-12 DIAGNOSIS — R062 Wheezing: Secondary | ICD-10-CM | POA: Insufficient documentation

## 2020-06-12 DIAGNOSIS — I10 Essential (primary) hypertension: Secondary | ICD-10-CM | POA: Insufficient documentation

## 2020-06-12 DIAGNOSIS — F0390 Unspecified dementia without behavioral disturbance: Secondary | ICD-10-CM | POA: Insufficient documentation

## 2020-06-12 DIAGNOSIS — Z7901 Long term (current) use of anticoagulants: Secondary | ICD-10-CM | POA: Diagnosis not present

## 2020-06-12 DIAGNOSIS — R0602 Shortness of breath: Secondary | ICD-10-CM | POA: Diagnosis not present

## 2020-06-12 DIAGNOSIS — I951 Orthostatic hypotension: Secondary | ICD-10-CM | POA: Insufficient documentation

## 2020-06-12 DIAGNOSIS — R42 Dizziness and giddiness: Secondary | ICD-10-CM | POA: Diagnosis present

## 2020-06-12 LAB — CBC WITH DIFFERENTIAL/PLATELET
Abs Immature Granulocytes: 0.02 10*3/uL (ref 0.00–0.07)
Basophils Absolute: 0.1 10*3/uL (ref 0.0–0.1)
Basophils Relative: 1 %
Eosinophils Absolute: 0.1 10*3/uL (ref 0.0–0.5)
Eosinophils Relative: 2 %
HCT: 38.6 % — ABNORMAL LOW (ref 39.0–52.0)
Hemoglobin: 12.2 g/dL — ABNORMAL LOW (ref 13.0–17.0)
Immature Granulocytes: 0 %
Lymphocytes Relative: 23 %
Lymphs Abs: 1.8 10*3/uL (ref 0.7–4.0)
MCH: 29.2 pg (ref 26.0–34.0)
MCHC: 31.6 g/dL (ref 30.0–36.0)
MCV: 92.3 fL (ref 80.0–100.0)
Monocytes Absolute: 0.7 10*3/uL (ref 0.1–1.0)
Monocytes Relative: 8 %
Neutro Abs: 5.1 10*3/uL (ref 1.7–7.7)
Neutrophils Relative %: 66 %
Platelets: 234 10*3/uL (ref 150–400)
RBC: 4.18 MIL/uL — ABNORMAL LOW (ref 4.22–5.81)
RDW: 14.2 % (ref 11.5–15.5)
WBC: 7.8 10*3/uL (ref 4.0–10.5)
nRBC: 0 % (ref 0.0–0.2)

## 2020-06-12 LAB — BASIC METABOLIC PANEL
Anion gap: 10 (ref 5–15)
BUN: 15 mg/dL (ref 8–23)
CO2: 28 mmol/L (ref 22–32)
Calcium: 9.2 mg/dL (ref 8.9–10.3)
Chloride: 98 mmol/L (ref 98–111)
Creatinine, Ser: 1.02 mg/dL (ref 0.61–1.24)
GFR calc Af Amer: >60 mL/min (ref >60)
GFR calc non Af Amer: >60 mL/min (ref >60)
Glucose, Bld: 117 mg/dL — ABNORMAL HIGH (ref 70–99)
Potassium: 3.3 mmol/L — ABNORMAL LOW (ref 3.5–5.1)
Sodium: 136 mmol/L (ref 135–145)

## 2020-06-12 LAB — URINALYSIS, ROUTINE W REFLEX MICROSCOPIC
Bacteria, UA: NONE SEEN
Bilirubin Urine: NEGATIVE
Glucose, UA: NEGATIVE mg/dL
Ketones, ur: NEGATIVE mg/dL
Leukocytes,Ua: NEGATIVE
Nitrite: NEGATIVE
Protein, ur: 30 mg/dL — AB
Specific Gravity, Urine: 1.018 (ref 1.005–1.030)
pH: 5 (ref 5.0–8.0)

## 2020-06-12 MED ORDER — POTASSIUM CHLORIDE CRYS ER 20 MEQ PO TBCR
20.0000 meq | EXTENDED_RELEASE_TABLET | Freq: Once | ORAL | Status: AC
  Administered 2020-06-12: 20 meq via ORAL
  Filled 2020-06-12: qty 1

## 2020-06-12 NOTE — ED Provider Notes (Signed)
[facility] EMERGENCY DEPARTMENT Provider Note   CSN: [id number] Arrival date & time: 06/12/20  1318     History Chief Complaint  Patient presents with  . sent by PCP    Dustin [name] is a 86 y.o. male with PMH significant for HTN, HLD, CAD, COPD, and MI who presents to the ED with multiple complaints.  Patient is accompanied by his wife reports that he has been having falls in the past, however most recently was 3 months ago.  He also is endorsing some generalized fatigue, wheezing, intermittent shortness of breath symptoms, and dizziness.  He states that he has a history of vertigo and inner ear disorders which are precipitated dizziness for him in the past.  He is not actively dizzy on my exam, but states that he developed symptoms with standing and ambulating.  He went to his primary care provider, Dr. Gerarda Fraction, who subsequently sent to the ED for further evaluation.  I spoke with the office staff who report that he could not stand up due to his disorientation and confusion.  On my examination, he does not appear to be particularly confused and instead continues to refer to his chronic inner ear dysfunction.  He denies any current chest pain, but states that a few days ago he had a take nitroglycerin x3 for symptoms of chest discomfort.  He also has had a "hacky" cough in addition to his wheezing.  He has not been treating his symptoms with inhalers.  He also has a large umbilical hernia and ultimately wants surgery, but denies any new changes.  He denies any fevers or chills, hemoptysis, chest pain, abdominal pain, nausea or vomiting, numbness or weakness, blurred vision, other neurologic deficits, urinary symptoms, or changes in bowels.   HPI     Past Medical History:  Diagnosis Date  . Alzheimer disease ([place])   . Anxiety   . Arthritis   . Atrophic gastritis    a. By EGD 02/2013.  . Carotid artery disease ([place])    a. mild-mod plaque, <50% stenosis bilat by duplex 2018.  [name] Coronary  atherosclerosis of native coronary artery    a. Multivessel s/p CABG 1996. b. s/p DES x 2 SVG to PDA 8/12 with distal disease managed medically.  . DDD (degenerative disc disease)    Chronic back pain  . Enlarged prostate   . Essential hypertension   . Hematuria   . Hypothyroidism   . LBBB (left bundle branch block)   . MI (myocardial infarction) ([place])   . Mixed hyperlipidemia   . OA (osteoarthritis)   . OSA (obstructive sleep apnea)   . Pneumonia due to COVID-19 virus    February 2021  . Sinus bradycardia    a. Aricept and BB discontinued due to this.    Patient Active Problem List   Diagnosis Date Noted  . Pneumonia due to COVID-19 virus 02/13/2020  . COPD (chronic obstructive pulmonary disease) ([place]) 12/10/2019  . COPD exacerbation ([place]) 12/07/2019  . Unstable angina ([place]) 11/07/2018  . Fall 11/07/2018  . Gait instability 11/07/2018  . Leukocytosis 11/07/2018  . Depression 11/07/2018  . BPH (benign prostatic hyperplasia) 11/07/2018  . Chronic pain 11/07/2018  . Pressure injury of skin 02/15/2017  . Chronic diastolic CHF (congestive heart failure) ([place]) 12/24/2016  . Sinus bradycardia 12/23/2016  . Foot pain, bilateral 12/23/2016  . Hypokalemia 04/05/2016  . Acute respiratory failure ([place]) 04/03/2016  . Acute encephalopathy 04/03/2016  . Hypothyroidism 04/03/2016  . Aspiration pneumonia ([place])  04/03/2016  . Dementia ([place]) 04/03/2016  . Acute respiratory failure with hypoxia ([place]) 04/03/2016  . Memory loss 04/20/2015  . Hypoxia 01/27/2015  . CAP (community acquired pneumonia) 01/27/2015  . Fever 08/30/2014  . Healthcare associated bacterial pneumonia 08/30/2014  . Toxic Metabolic encephalopathy 53/66/4403  . Sepsis ([place]) 08/30/2014  . Neck pain on right side 08/29/2014  . Neck pain 08/29/2014  . Left-sided weakness 06/28/2014  . Chest pain 06/27/2014  . Tubular adenoma of colon 03/05/2013  . Anorexia 03/05/2013  . Loss of weight 03/05/2013  . Chronic  constipation 07/04/2012  . Bronchitis 08/16/2011  . Hyperlipidemia 10/01/2009  . OSA (obstructive sleep apnea) 10/01/2009  . Alzheimer disease ([place]) 10/01/2009  . HYPERTENSION, BENIGN 10/01/2009  . Coronary artery disease 10/01/2009    Past Surgical History:  Procedure Laterality Date  . COLONOSCOPY  08/03/2004   Jenkins-numerous large diverticula in the descending, transverse, descending, and sigmoid colon. Otherwise normal exam.  . COLONOSCOPY  07/12/2012   RMR: External hemorrhoidal tag; multiple rectal and colonic polyps removed and/or treated as described above. Pancolonic diverticulosis. Bx-tubular adenomas, rectal hyperplastic polyp. next colonoscopy in 06/2015.  [name] COLONOSCOPY N/A 06/22/2016   Procedure: COLONOSCOPY;  Surgeon: Aviva Signs, MD;  Location: [facility];  Service: Gastroenterology;  Laterality: N/A;  730  . CORONARY ANGIOPLASTY WITH STENT PLACEMENT    . CORONARY ARTERY BYPASS GRAFT  1996   LIMA to LAD, SVG to D2, SVG to PDA, SVG to OM1 and OM2  . CORONARY STENT INTERVENTION N/A 12/10/2019   Procedure: CORONARY STENT INTERVENTION;  Surgeon: Burnell Blanks, MD;  Location: [place] [facility];  Service: Cardiovascular;  Laterality: N/A;  . ESOPHAGOGASTRODUODENOSCOPY N/A 03/16/2013   Procedure: ESOPHAGOGASTRODUODENOSCOPY (EGD);  Surgeon: Daneil Dolin, MD;  Location: [facility];  Service: Endoscopy;  Laterality: N/A;  12:00-moved to [place] notified pt  . ESOPHAGOGASTRODUODENOSCOPY N/A 03/06/2013   Procedure: ESOPHAGOGASTRODUODENOSCOPY (EGD);  Surgeon: Daneil Dolin, MD;  Location: [facility];  Service: Endoscopy;  Laterality: N/A;  . HERNIA REPAIR    . INTRAVASCULAR ULTRASOUND/IVUS N/A 12/10/2019   Procedure: Intravascular Ultrasound/IVUS;  Surgeon: Burnell Blanks, MD;  Location: [place] [facility];  Service: Cardiovascular;  Laterality: N/A;  . LEFT HEART CATH AND CORS/GRAFTS ANGIOGRAPHY N/A 12/10/2019   Procedure: LEFT HEART CATH AND  CORS/GRAFTS ANGIOGRAPHY;  Surgeon: Burnell Blanks, MD;  Location: [place] [facility];  Service: Cardiovascular;  Laterality: N/A;       Family History  Problem Relation Age of Onset  . Heart disease Other   . Heart attack Mother   . Colon cancer Neg Hx     Social History   Tobacco Use  . Smoking status: Former Smoker    Packs/day: 2.00    Years: 40.00    Pack years: 80.00    Types: Cigarettes    Quit date: 12/27/1994    Years since quitting: 25.4  . Smokeless tobacco: Never Used  Vaping Use  . Vaping Use: Never used  Substance Use Topics  . Alcohol use: No    Alcohol/week: 0.0 standard drinks  . Drug use: No    Home Medications Prior to Admission medications   Medication Sig Start Date End Date Taking? Authorizing Provider  albuterol (PROVENTIL) (2.5 MG/3ML) 0.083% nebulizer solution Take 3 mLs (2.5 mg total) by nebulization every 6 (six) hours as needed for wheezing or shortness of breath. Patient will also need the nebulizer machine with this prescription. 10/22/18  Nat Christen, MD  albuterol (VENTOLIN HFA) 108 (90 Base) MCG/ACT inhaler Inhale 2 puffs into the lungs every 6 (six) hours as needed for wheezing or shortness of breath.    [provider]  aspirin EC 81 MG tablet Take 81 mg by mouth daily.    [provider]  busPIRone (BUSPAR) 10 MG tablet Take 10 mg by mouth 2 (two) times daily.  11/03/18   [provider]  clopidogrel (PLAVIX) 75 MG tablet Take 1 tablet (75 mg total) by mouth daily. 08/14/19   Satira Sark, MD  dutasteride (AVODART) 0.5 MG capsule Take 0.5 mg by mouth. In the afternoon    [provider]  escitalopram (LEXAPRO) 20 MG tablet Take 20 mg by mouth every morning.     [provider]  furosemide (LASIX) 20 MG tablet Take 20 mg by mouth 2 (two) times daily.  12/03/16   [provider]  hydrOXYzine (ATARAX/VISTARIL) 25 MG tablet Take 25 mg by mouth 4 (four) times daily as needed for  anxiety.  11/19/19   [provider]  isosorbide mononitrate (IMDUR) 30 MG 24 hr tablet Take 1 tablet (30 mg total) by mouth daily. 03/17/20   Satira Sark, MD  levothyroxine (SYNTHROID, LEVOTHROID) 50 MCG tablet Take 50 mcg by mouth every morning.  10/04/16   [provider]  memantine (NAMENDA) 10 MG tablet Take 10 mg by mouth 2 (two) times daily.  03/05/16   [provider]  nitroGLYCERIN (NITROSTAT) 0.4 MG SL tablet Place 1 tablet (0.4 mg total) under the tongue every 5 (five) minutes as needed for chest pain. PLACE ONE TABLET UNDER TONGUE EVERY 5 MIN UP TO 3 DOSES AS NEEDED FORCHEST PAIN. 12/17/19   Barrett, Evelene Croon, PA-C  oxycodone (ROXICODONE) 30 MG immediate release tablet Take 30 mg by mouth every 4 (four) hours as needed for pain. Take 1 tablet by mouth every 4 to 6 hours as needed for pain.  Max 8 /24 hours. 12/09/16   [provider]  pantoprazole (PROTONIX) 40 MG tablet Take 1 tablet (40 mg total) by mouth every morning. 02/16/20   Barton Dubois, MD  potassium chloride SA (K-DUR,KLOR-CON) 20 MEQ tablet Take 20 mEq by mouth daily.     [provider]  QUEtiapine (SEROQUEL) 25 MG tablet Take 25 mg by mouth at bedtime. 11/19/19   [provider]  rosuvastatin (CRESTOR) 40 MG tablet Take 1 tablet (40 mg total) by mouth daily at 6 PM. 12/11/19 03/17/20  Hollice Gong, Mir Earlie Server, MD  tamsulosin (FLOMAX) 0.4 MG CAPS capsule Take 1 capsule (0.4 mg total) by mouth daily. To help you pee. 12/08/19   Enzo Bi, MD  zinc sulfate 220 (50 Zn) MG capsule Take 1 capsule (220 mg total) by mouth daily. 02/17/20   Barton Dubois, MD    Allergies    Patient has no known allergies.  Review of Systems   Review of Systems  All other systems reviewed and are negative.   Physical Exam Updated Vital Signs BP 94/74 (BP Location: Left Arm)   Pulse 65   Temp 97.9 F (36.6 C) (Oral)   Resp 14   Ht 5\' 10"  (1.778 m)   Wt 97.5 kg   SpO2 93%   BMI  30.85 kg/m   Physical Exam Vitals and nursing note reviewed. Exam conducted with a chaperone present.  Constitutional:      Appearance: Normal appearance.  HENT:     Head: Normocephalic and atraumatic.  Eyes:     General: No scleral icterus.    Conjunctiva/sclera: Conjunctivae normal.  Cardiovascular:     Rate and Rhythm: Normal rate and regular rhythm.     Pulses: Normal pulses.     Heart sounds: Normal heart sounds.  Pulmonary:     Comments: No significant increased work of breathing.  Moderate wheezing auscultated diffusely.  Breath sounds intact bilaterally.  Symmetric chest rise. Abdominal:     Comments: Large ventral hernia and diastasis recti.  No tenderness or guarding.  No other overlying skin changes.  Skin:    General: Skin is dry.     Capillary Refill: Capillary refill takes less than 2 seconds.  Neurological:     Mental Status: He is alert.     GCS: GCS eye subscore is 4. GCS verbal subscore is 5. GCS motor subscore is 6.     Comments: CN II through XII grossly intact.  No extremity weakness or numbness.  Ambulates without significant ataxia.  Negative Dix-Hallpike testing.  Negative HINTS exam.   Psychiatric:        Mood and Affect: Mood normal.        Behavior: Behavior normal.        Thought Content: Thought content normal.     ED Results / Procedures / Treatments   Labs (all labs ordered are listed, but only abnormal results are displayed) Labs Reviewed  CBC WITH DIFFERENTIAL/PLATELET - Abnormal; Notable for the following components:      Result Value   RBC 4.18 (*)    Hemoglobin 12.2 (*)    HCT 38.6 (*)    All other components within normal limits  BASIC METABOLIC PANEL - Abnormal; Notable for the following components:   Potassium 3.3 (*)    Glucose, Bld 117 (*)    All other components within normal limits    EKG None  Radiology No results found.  Procedures Procedures (including critical care time)  Medications Ordered in ED Medications -  No data to display  ED Course  I have reviewed the triage vital signs and the nursing notes.  Pertinent labs & imaging results that were available during my care of the patient were reviewed by me and considered in my medical decision making (see chart for details).    MDM Rules/Calculators/A&P                          Patient's history and physical exam is consistent with orthostatic hypotension.  Orthostatics obtained here show that his blood pressure drops 25 points systolic when going from lying to standing position.  Heart rate remains unchanged.  This is consistent with his reports that he gets dizzy upon standing before it slowly improves.  While at the primary care provider office was concern for an acute change, understandably, patient and his wife are adamant that this has been going on for a while.  He also was endorsing some early dementia and suspects that may be contributing to any confusion.  On my exam, he is answering questions appropriately.  His neurologic exam is entirely benign.  ROM and strength intact throughout.  Sensation intact.  Negative Dix-Hallpike testing and he has not had any room spinning dizziness while here in the department.    Laboratory work-up is largely reassuring.  Patient was mildly hypokalemic to 3.3 here in the ED, replenished with 20 mEq K-Dur.  Patient to follow-up with his primary care provider for laboratory recheck in  a few days.  UA demonstrates no evidence of infection.  DG chest is personally reviewed and without any acute cardiopulmonary findings.  EKG shows no significant changes from prior tracings.  Patient is requesting to be discharged at this time as he feels well and would like to go home.  I feel as though that is reasonable as long as he is getting appropriate follow-up with his primary care provider.  Do not feel as though had imaging is warranted at this time given negative neurologic examination and patient's report that his dizziness upon  standing has been going on for "a long time".  Patient was personally evaluated by Dr. Stark Jock who agrees with assessment and plan.  Strict ED return precautions discussed with patient and wife.  All of the evaluation and work-up results were discussed with the patient and any family at bedside. They were provided opportunity to ask any additional questions and have none at this time. They have expressed understanding of verbal discharge instructions as well as return precautions and are agreeable to the plan.    Final Clinical Impression(s) / ED Diagnoses Final diagnoses:  None    Rx / DC Orders ED Discharge Orders    None       Corena Herter, PA-C 06/12/20 1904    Veryl Speak, MD 06/12/20 [phone]

## 2020-06-12 NOTE — ED Triage Notes (Signed)
Pt unable to state why Dr. Gerarda Fraction sent him here.  Pt c/o sob with mask.

## 2020-06-12 NOTE — ED Notes (Signed)
Pt has dementia as well.

## 2020-06-12 NOTE — Discharge Instructions (Signed)
Please follow-up with your doctor regarding today's encounter.  You will need to have laboratory recheck in the next few days to ensure correction of your electrolyte derangement.  You were mildly low on your potassium today.  Return to the ED or seek immediate medical attention should you experience any new or worsening symptoms.

## 2020-06-16 DIAGNOSIS — J449 Chronic obstructive pulmonary disease, unspecified: Secondary | ICD-10-CM | POA: Diagnosis not present

## 2020-06-16 DIAGNOSIS — U071 COVID-19: Secondary | ICD-10-CM | POA: Diagnosis not present

## 2020-06-19 DIAGNOSIS — H9190 Unspecified hearing loss, unspecified ear: Secondary | ICD-10-CM | POA: Diagnosis not present

## 2020-06-19 DIAGNOSIS — K219 Gastro-esophageal reflux disease without esophagitis: Secondary | ICD-10-CM | POA: Diagnosis not present

## 2020-06-19 DIAGNOSIS — G894 Chronic pain syndrome: Secondary | ICD-10-CM | POA: Diagnosis not present

## 2020-06-19 DIAGNOSIS — F341 Dysthymic disorder: Secondary | ICD-10-CM | POA: Diagnosis not present

## 2020-06-19 DIAGNOSIS — K5909 Other constipation: Secondary | ICD-10-CM | POA: Diagnosis not present

## 2020-06-19 DIAGNOSIS — Z7902 Long term (current) use of antithrombotics/antiplatelets: Secondary | ICD-10-CM | POA: Diagnosis not present

## 2020-06-19 DIAGNOSIS — G309 Alzheimer's disease, unspecified: Secondary | ICD-10-CM | POA: Diagnosis not present

## 2020-06-19 DIAGNOSIS — J449 Chronic obstructive pulmonary disease, unspecified: Secondary | ICD-10-CM | POA: Diagnosis not present

## 2020-06-19 DIAGNOSIS — Z951 Presence of aortocoronary bypass graft: Secondary | ICD-10-CM | POA: Diagnosis not present

## 2020-06-19 DIAGNOSIS — I2510 Atherosclerotic heart disease of native coronary artery without angina pectoris: Secondary | ICD-10-CM | POA: Diagnosis not present

## 2020-06-19 DIAGNOSIS — E063 Autoimmune thyroiditis: Secondary | ICD-10-CM | POA: Diagnosis not present

## 2020-06-19 DIAGNOSIS — Z6829 Body mass index (BMI) 29.0-29.9, adult: Secondary | ICD-10-CM | POA: Diagnosis not present

## 2020-06-19 DIAGNOSIS — E119 Type 2 diabetes mellitus without complications: Secondary | ICD-10-CM | POA: Diagnosis not present

## 2020-06-19 DIAGNOSIS — F17200 Nicotine dependence, unspecified, uncomplicated: Secondary | ICD-10-CM | POA: Diagnosis not present

## 2020-06-19 DIAGNOSIS — M48061 Spinal stenosis, lumbar region without neurogenic claudication: Secondary | ICD-10-CM | POA: Diagnosis not present

## 2020-06-19 DIAGNOSIS — E782 Mixed hyperlipidemia: Secondary | ICD-10-CM | POA: Diagnosis not present

## 2020-06-19 DIAGNOSIS — G629 Polyneuropathy, unspecified: Secondary | ICD-10-CM | POA: Diagnosis not present

## 2020-06-19 DIAGNOSIS — F0280 Dementia in other diseases classified elsewhere without behavioral disturbance: Secondary | ICD-10-CM | POA: Diagnosis not present

## 2020-06-19 DIAGNOSIS — E039 Hypothyroidism, unspecified: Secondary | ICD-10-CM | POA: Diagnosis not present

## 2020-06-19 DIAGNOSIS — E663 Overweight: Secondary | ICD-10-CM | POA: Diagnosis not present

## 2020-06-19 DIAGNOSIS — F1120 Opioid dependence, uncomplicated: Secondary | ICD-10-CM | POA: Diagnosis not present

## 2020-06-19 DIAGNOSIS — I10 Essential (primary) hypertension: Secondary | ICD-10-CM | POA: Diagnosis not present

## 2020-06-19 DIAGNOSIS — I878 Other specified disorders of veins: Secondary | ICD-10-CM | POA: Diagnosis not present

## 2020-06-19 DIAGNOSIS — N400 Enlarged prostate without lower urinary tract symptoms: Secondary | ICD-10-CM | POA: Diagnosis not present

## 2020-07-11 DIAGNOSIS — Z6829 Body mass index (BMI) 29.0-29.9, adult: Secondary | ICD-10-CM | POA: Diagnosis not present

## 2020-07-11 DIAGNOSIS — G894 Chronic pain syndrome: Secondary | ICD-10-CM | POA: Diagnosis not present

## 2020-07-11 DIAGNOSIS — I208 Other forms of angina pectoris: Secondary | ICD-10-CM | POA: Diagnosis not present

## 2020-07-11 DIAGNOSIS — M1991 Primary osteoarthritis, unspecified site: Secondary | ICD-10-CM | POA: Diagnosis not present

## 2020-07-11 DIAGNOSIS — J329 Chronic sinusitis, unspecified: Secondary | ICD-10-CM | POA: Diagnosis not present

## 2020-07-16 DIAGNOSIS — L820 Inflamed seborrheic keratosis: Secondary | ICD-10-CM | POA: Diagnosis not present

## 2020-07-16 DIAGNOSIS — U071 COVID-19: Secondary | ICD-10-CM | POA: Diagnosis not present

## 2020-07-16 DIAGNOSIS — J449 Chronic obstructive pulmonary disease, unspecified: Secondary | ICD-10-CM | POA: Diagnosis not present

## 2020-07-16 DIAGNOSIS — B078 Other viral warts: Secondary | ICD-10-CM | POA: Diagnosis not present

## 2020-07-24 NOTE — Progress Notes (Signed)
[name] Note  07/28/2020     CHIEF COMPLAINT Patient presents for Retina Evaluation   HISTORY OF PRESENT ILLNESS: Dustin [name] is a 86 y.o. male who presents to the clinic today for:   HPI    Retina Evaluation    In left eye.  Associated Symptoms Negative for Pain.          Comments    Patient here for retina evaluation. Patient states vision can't half see. Eye sight failing. Started after cataract surgery. Could read before cat surgery. Now cant read. Readers don't help. Has some scratchiness in eye.       Last edited by Theodore Demark, COA on 07/28/2020  1:59 PM. (History)    Dr. Zigmund Daniel pt here for decreased VA (last seen June 2020), pt states he has been out of his drops for awhile and would like a new rx   HISTORICAL INFORMATION:   Selected notes from the MEDICAL RECORD NUMBER Pt of Dr. Zigmund Daniel LEE: 06.29.20 BCVA: OD 20/20, OS 20/30 Ocular Hx- CME OD, EMR OD, HTN Ret. OU, Choroidal Nevus OS, NS OS, ARMD OS, Pseudo OD    CURRENT MEDICATIONS: Current Outpatient Medications (Ophthalmic Drugs)  Medication Sig  . Bromfenac Sodium (PROLENSA) 0.07 % SOLN Place 1 drop into the right eye 4 (four) times daily.  . prednisoLONE acetate (PRED FORTE) 1 % ophthalmic suspension Place 1 drop into the right eye 4 (four) times daily.   No current facility-administered medications for this visit. (Ophthalmic Drugs)   Current Outpatient Medications (Other)  Medication Sig  . albuterol (PROVENTIL) (2.5 MG/3ML) 0.083% nebulizer solution Take 3 mLs (2.5 mg total) by nebulization every 6 (six) hours as needed for wheezing or shortness of breath. Patient will also need the nebulizer machine with this prescription.  [name] albuterol (VENTOLIN HFA) 108 (90 Base) MCG/ACT inhaler Inhale 2 puffs into the lungs every 6 (six) hours as needed for wheezing or shortness of breath.  [name] aspirin EC 81 MG tablet Take 81 mg by mouth daily.  . busPIRone (BUSPAR) 10 MG  tablet Take 10 mg by mouth 2 (two) times daily.   . clopidogrel (PLAVIX) 75 MG tablet Take 1 tablet (75 mg total) by mouth daily.  [name] dutasteride (AVODART) 0.5 MG capsule Take 0.5 mg by mouth. In the afternoon  . escitalopram (LEXAPRO) 20 MG tablet Take 20 mg by mouth every morning.   . furosemide (LASIX) 20 MG tablet Take 20 mg by mouth 2 (two) times daily.   . hydrOXYzine (ATARAX/VISTARIL) 25 MG tablet Take 25 mg by mouth 4 (four) times daily as needed for anxiety.   . isosorbide mononitrate (IMDUR) 30 MG 24 hr tablet Take 1 tablet (30 mg total) by mouth daily.  [name] levothyroxine (SYNTHROID, LEVOTHROID) 50 MCG tablet Take 50 mcg by mouth every morning.   . memantine (NAMENDA) 10 MG tablet Take 10 mg by mouth 2 (two) times daily.   . nitroGLYCERIN (NITROSTAT) 0.4 MG SL tablet Place 1 tablet (0.4 mg total) under the tongue every 5 (five) minutes as needed for chest pain. PLACE ONE TABLET UNDER TONGUE EVERY 5 MIN UP TO 3 DOSES AS NEEDED FORCHEST PAIN.  [name] oxycodone (ROXICODONE) 30 MG immediate release tablet Take 30 mg by mouth every 4 (four) hours as needed for pain. Take 1 tablet by mouth every 4 to 6 hours as needed for pain.  Max 8 /24 hours.  . pantoprazole (PROTONIX) 40 MG tablet Take  1 tablet (40 mg total) by mouth every morning.  . potassium chloride SA (K-DUR,KLOR-CON) 20 MEQ tablet Take 20 mEq by mouth daily.   . QUEtiapine (SEROQUEL) 25 MG tablet Take 25 mg by mouth at bedtime.  . rosuvastatin (CRESTOR) 40 MG tablet Take 1 tablet (40 mg total) by mouth daily at 6 PM.  . tamsulosin (FLOMAX) 0.4 MG CAPS capsule Take 1 capsule (0.4 mg total) by mouth daily. To help you pee.  . zinc sulfate 220 (50 Zn) MG capsule Take 1 capsule (220 mg total) by mouth daily.   No current facility-administered medications for this visit. (Other)      REVIEW OF SYSTEMS:    ALLERGIES No Known Allergies  PAST MEDICAL HISTORY Past Medical History:  Diagnosis Date  . Alzheimer disease ([place])   . Anxiety    . Arthritis   . Atrophic gastritis    a. By EGD 02/2013.  . Carotid artery disease ([place])    a. mild-mod plaque, <50% stenosis bilat by duplex 2018.  [name] Coronary atherosclerosis of native coronary artery    a. Multivessel s/p CABG 1996. b. s/p DES x 2 SVG to PDA 8/12 with distal disease managed medically.  . DDD (degenerative disc disease)    Chronic back pain  . Enlarged prostate   . Essential hypertension   . Hematuria   . Hypothyroidism   . LBBB (left bundle branch block)   . MI (myocardial infarction) ([place])   . Mixed hyperlipidemia   . OA (osteoarthritis)   . OSA (obstructive sleep apnea)   . Pneumonia due to COVID-19 virus    February 2021  . Sinus bradycardia    a. Aricept and BB discontinued due to this.   Past Surgical History:  Procedure Laterality Date  . COLONOSCOPY  08/03/2004   Jenkins-numerous large diverticula in the descending, transverse, descending, and sigmoid colon. Otherwise normal exam.  . COLONOSCOPY  07/12/2012   RMR: External hemorrhoidal tag; multiple rectal and colonic polyps removed and/or treated as described above. Pancolonic diverticulosis. Bx-tubular adenomas, rectal hyperplastic polyp. next colonoscopy in 06/2015.  [name] COLONOSCOPY N/A 06/22/2016   Procedure: COLONOSCOPY;  Surgeon: Aviva Signs, MD;  [facility];  Service: Gastroenterology;  Laterality: N/A;  730  . CORONARY ANGIOPLASTY WITH STENT PLACEMENT    . CORONARY ARTERY BYPASS GRAFT  1996   LIMA to LAD, SVG to D2, SVG to PDA, SVG to OM1 and OM2  . CORONARY STENT INTERVENTION N/A 12/10/2019   Procedure: CORONARY STENT INTERVENTION;  Surgeon: Burnell Blanks, MD;  Location: [place] [facility];  Service: Cardiovascular;  Laterality: N/A;  . ESOPHAGOGASTRODUODENOSCOPY N/A 03/16/2013   Procedure: ESOPHAGOGASTRODUODENOSCOPY (EGD);  Surgeon: Daneil Dolin, MD;  Location: [facility];  Service: Endoscopy;  Laterality: N/A;  12:00-moved to [place] notified pt  .  ESOPHAGOGASTRODUODENOSCOPY N/A 03/06/2013   Procedure: ESOPHAGOGASTRODUODENOSCOPY (EGD);  Surgeon: Daneil Dolin, MD;  [facility];  Service: Endoscopy;  Laterality: N/A;  . HERNIA REPAIR    . INTRAVASCULAR ULTRASOUND/IVUS N/A 12/10/2019   Procedure: Intravascular Ultrasound/IVUS;  Surgeon: Burnell Blanks, MD;  Location: [place] [facility];  Service: Cardiovascular;  Laterality: N/A;  . LEFT HEART CATH AND CORS/GRAFTS ANGIOGRAPHY N/A 12/10/2019   Procedure: LEFT HEART CATH AND CORS/GRAFTS ANGIOGRAPHY;  Surgeon: Burnell Blanks, MD;  Location: [place] [facility];  Service: Cardiovascular;  Laterality: N/A;    FAMILY HISTORY Family History  Problem Relation Age of Onset  . Heart  disease Other   . Heart attack Mother   . Colon cancer Neg Hx     SOCIAL HISTORY Social History   Tobacco Use  . Smoking status: Former Smoker    Packs/day: 2.00    Years: 40.00    Pack years: 80.00    Types: Cigarettes    Quit date: 12/27/1994    Years since quitting: 25.6  . Smokeless tobacco: Never Used  Vaping Use  . Vaping Use: Never used  Substance Use Topics  . Alcohol use: No    Alcohol/week: 0.0 standard drinks  . Drug use: No         OPHTHALMIC EXAM:  Base Eye Exam    Visual Acuity (Snellen - Linear)      Right Left   Dist [place] 20/25 20/50   Dist ph [place] NI 20/30 -1       Tonometry (Tonopen, 1:54 PM)      Right Left   Pressure 10 11       Pupils      Dark Light Shape React APD   Right 3 2 Round Brisk None   Left 3 2 Round Brisk None       Visual Fields (Counting fingers)      Left Right    Full Full       Extraocular Movement      Right Left    Full, Ortho Full, Ortho       Neuro/Psych    Oriented x3: Yes   Mood/Affect: Normal       Dilation    Both eyes: 1.0% Mydriacyl, 2.5% Phenylephrine @ 1:54 PM        Slit Lamp and Fundus Exam    Slit Lamp Exam      Right Left   Lids/Lashes Dermatochalasis - upper lid, Meibomian gland  dysfunction Dermatochalasis - upper lid, Meibomian gland dysfunction   Conjunctiva/Sclera White and quiet White and quiet   Cornea arcus, trace Punctate epithelial erosions arcus, trace Punctate epithelial erosions   Anterior Chamber Deep and quiet Deep and quiet   Iris Round and dilated Round and moderately dilated to 5.90m   Lens PC IOL in good position 2-3+ Nuclear sclerosis with brunescence, 2+ Cortical cataract   Vitreous Vitreous syneresis Vitreous syneresis, Posterior vitreous detachment       Fundus Exam      Right Left   Disc Mild Pallor, Sharp rim Pink and Sharp   C/D Ratio 0.5 0.5   Macula Flat, Blunted foveal reflex, +ERM, +Cystic changes, RPE mottling Flat, Blunted foveal reflex, RPE mottling, clumping and early atrophy   Vessels Vascular attenuation, mild tortuousity Vascular attenuation, mild tortuousity   Periphery Attached, reticular degeneration, No heme  Attached, amelonotic choroidal nevus along distal IT arcades, reticular degeneration, No heme         Refraction    Manifest Refraction      Sphere Cylinder Axis Dist VA   Right [place] +0.25 180 20/25   Left [place] +0.50 015 20/30-2          IMAGING AND PROCEDURES  Imaging and Procedures for 07/28/2020  OCT, Retina - OU - Both Eyes       Right Eye Quality was good. Central Foveal Thickness: 337. Progression has worsened. Findings include abnormal foveal contour, epiretinal membrane, intraretinal fluid, no SRF (Interval increase in cystic changes/CME).   Left Eye Quality was good. Central Foveal Thickness: 338. Progression has been stable. Findings include intraretinal hyper-reflective material, abnormal foveal contour,  no IRF, no SRF, epiretinal membrane, retinal drusen  (Focal ellipsoid disruption with IRHM IN fovea).   Notes *Images captured and stored on drive  Diagnosis / Impression:  OD: ERM; abnormal foveal contour; +IRF/cystic changes increased from prior (Jun 2020); no SRF OS: nonexudative ARMD w/  focal ellipsoid disruption and IRHM inf nasal fovea; no IRF/SRF  Clinical management:  See below  Abbreviations: NFP - Normal foveal profile. CME - cystoid macular edema. PED - pigment epithelial detachment. IRF - intraretinal fluid. SRF - subretinal fluid. EZ - ellipsoid zone. ERM - epiretinal membrane. ORA - outer retinal atrophy. ORT - outer retinal tubulation. SRHM - subretinal hyper-reflective material. IRHM - intraretinal hyper-reflective material                 ASSESSMENT/PLAN:    ICD-10-CM   1. Cystoid macular edema of right eye  H35.351   2. Retinal edema  H35.81 OCT, Retina - OU - Both Eyes  3. Epiretinal membrane (ERM) of right eye  H35.371   4. Intermediate stage nonexudative age-related macular degeneration of left eye  H35.3122   5. Nevus of choroid of left eye  D31.32   6. Essential hypertension  I10   7. Hypertensive retinopathy of both eyes  H35.033   8. Pseudophakia of right eye  Z96.1   9. Combined forms of age-related cataract of left eye  H25.812     1,2. CME OD  - Zigmund Daniel pt, lost to f/u since June 2020  - hx of RBB kenalog 08.15.18  - was on Prolensa QHS OD for maintenance, but has been out of drops for months  - BCVA 20/25 from 20/20  - recommend Prolensa and PF QID OD  - f/u 4 weeks, DFE, OCT  3. Epiretinal membrane, right eye  - The natural history, anatomy, potential for loss of vision, and treatment options including vitrectomy techniques and the complications of endophthalmitis, retinal detachment, vitreous hemorrhage, cataract progression and permanent vision loss discussed with the patient. - mild ERM - BCVA 20/25 - asymptomatic, no metamorphopsia - no indication for surgery at this time - monitor for now  4. Age related macular degeneration, non-exudative, left eye  - The incidence, anatomy, and pathology of dry AMD, risk of progression, and the AREDS and AREDS 2 study including smoking risks discussed with patient.  - Recommend amsler  grid monitoring  5. Choroidal nevus OS  - amelanotic nevus along distal ST arcades  - no SRF or orange pigment  - no elevation  - monitor  6,7. Hypertensive retinopathy OU - discussed importance of tight BP control - monitor  8. Pseudophakia OD  - s/p CE/IOL OD ([place])  - IOL in good position, doing well  - monitor  9. Mixed Cataract OS - The symptoms of cataract, surgical options, and treatments and risks were discussed with patient. - discussed diagnosis and progression  - approaching visual significance  - under the expert management of [place] Ordered this visit:  Meds ordered this encounter  Medications  . Bromfenac Sodium (PROLENSA) 0.07 % SOLN    Sig: Place 1 drop into the right eye 4 (four) times daily.    Dispense:  3 mL    Refill:  3  . prednisoLONE acetate (PRED FORTE) 1 % ophthalmic suspension    Sig: Place 1 drop into the right eye 4 (four) times daily.    Dispense:  15 mL    Refill:  0  Return in about 4 weeks (around 08/25/2020) for f/u CME OD, DFE, OCT.  There are no Patient Instructions on file for this visit.   Explained the diagnoses, plan, and follow up with the patient and they expressed understanding.  Patient expressed understanding of the importance of proper follow up care.   This document serves as a record of services personally performed by Gardiner Sleeper, MD, PhD. It was created on their behalf by Leonie Douglas, an ophthalmic technician. The creation of this record is the provider's dictation and/or activities during the visit.    Electronically signed by: Leonie Douglas COA, 07/28/20  3:59 PM   Gardiner Sleeper, M.D., Ph.D. Diseases & Surgery of the Retina and Vitreous [name]  I have reviewed the above documentation for accuracy and completeness, and I agree with the above. Gardiner Sleeper, M.D., Ph.D. 07/28/20 3:59 PM   Abbreviations: M myopia (nearsighted); A astigmatism;  H hyperopia (farsighted); P presbyopia; Mrx spectacle prescription;  CTL contact lenses; OD right eye; OS left eye; OU both eyes  XT exotropia; ET esotropia; PEK punctate epithelial keratitis; PEE punctate epithelial erosions; DES dry eye syndrome; MGD meibomian gland dysfunction; ATs artificial tears; PFAT's preservative free artificial tears; [place] nuclear sclerotic cataract; PSC posterior subcapsular cataract; ERM epi-retinal membrane; PVD posterior vitreous detachment; RD retinal detachment; DM diabetes mellitus; DR diabetic retinopathy; NPDR non-proliferative diabetic retinopathy; PDR proliferative diabetic retinopathy; CSME clinically significant macular edema; DME diabetic macular edema; dbh dot blot hemorrhages; CWS cotton wool spot; POAG primary open angle glaucoma; C/D cup-to-disc ratio; HVF humphrey visual field; GVF goldmann visual field; OCT optical coherence tomography; IOP intraocular pressure; BRVO Branch retinal vein occlusion; CRVO central retinal vein occlusion; CRAO central retinal artery occlusion; BRAO branch retinal artery occlusion; RT retinal tear; SB scleral buckle; PPV pars plana vitrectomy; VH Vitreous hemorrhage; PRP panretinal laser photocoagulation; IVK intravitreal kenalog; VMT vitreomacular traction; MH Macular hole;  NVD neovascularization of the disc; NVE neovascularization elsewhere; AREDS age related eye disease study; ARMD age related macular degeneration; POAG primary open angle glaucoma; EBMD epithelial/anterior basement membrane dystrophy; ACIOL anterior chamber intraocular lens; IOL intraocular lens; PCIOL posterior chamber intraocular lens; Phaco/IOL phacoemulsification with intraocular lens placement; [place] photorefractive keratectomy; LASIK laser assisted in situ keratomileusis; HTN hypertension; DM diabetes mellitus; COPD chronic obstructive pulmonary disease

## 2020-07-25 ENCOUNTER — Other Ambulatory Visit: Payer: Self-pay

## 2020-07-25 ENCOUNTER — Encounter: Payer: Self-pay | Admitting: Cardiology

## 2020-07-25 ENCOUNTER — Ambulatory Visit: Payer: PPO | Admitting: Cardiology

## 2020-07-25 VITALS — BP 126/64 | HR 60 | Ht 70.0 in | Wt 206.0 lb

## 2020-07-25 DIAGNOSIS — E782 Mixed hyperlipidemia: Secondary | ICD-10-CM | POA: Diagnosis not present

## 2020-07-25 DIAGNOSIS — I25119 Atherosclerotic heart disease of native coronary artery with unspecified angina pectoris: Secondary | ICD-10-CM

## 2020-07-25 NOTE — Patient Instructions (Signed)
Medication Instructions:  °Your physician recommends that you continue on your current medications as directed. Please refer to the Current Medication list given to you today. ° °*If you need a refill on your cardiac medications before your next appointment, please call your pharmacy* ° ° °Lab Work: °None today °If you have labs (blood work) drawn today and your tests are completely normal, you will receive your results only by: °• MyChart Message (if you have MyChart) OR °• A paper copy in the mail °If you have any lab test that is abnormal or we need to change your treatment, we will call you to review the results. ° ° °Testing/Procedures: °None today ° ° °Follow-Up: °At [facility], you and your health needs are our priority.  As part of our continuing mission to provide you with exceptional heart care, we have created designated Provider Care Teams.  These Care Teams include your primary Cardiologist (physician) and Advanced Practice Providers (APPs -  Physician Assistants and Nurse Practitioners) who all work together to provide you with the care you need, when you need it. ° °We recommend signing up for the patient portal called "MyChart".  Sign up information is provided on this After Visit Summary.  MyChart is used to connect with patients for Virtual Visits (Telemedicine).  Patients are able to view lab/test results, encounter notes, upcoming appointments, etc.  Non-urgent messages can be sent to your provider as well.   °To learn more about what you can do with MyChart, go to [email or web address].   ° °Your next appointment:   °6 month(s) ° °The format for your next appointment:   °In Person ° °Provider:   °Samuel McDowell, MD ° ° °Other Instructions °None ° ° ° ° °Thank you for choosing [facility] ! ° ° ° ° ° ° ° ° °

## 2020-07-25 NOTE — Progress Notes (Signed)
Cardiology Office Note  Date: 07/25/2020   ID: Dustin [name], DOB 05-Dec-1934, MRN [id number]  PCP:  [name], MD  Cardiologist:  Rozann Lesches, MD Electrophysiologist:  None   Chief Complaint  Patient presents with  . Cardiac follow-up    History of Present Illness: Dustin [name] is an 86 y.o. male last seen in March.  He presents for a routine visit.  He tells me that he had improvement in angina symptoms after we reinitiated aspirin and Imdur at the last visit, has only had to use one sublingual nitroglycerin tablet in the interim.  He does not report progressive dizziness or syncope, although has had intermittent fluctuations in blood pressure.  I reviewed his interval lab work which is outlined below.  He does not report any intolerances to his current cardiac regimen which is outlined below.  I reviewed his ECG from June.  Past Medical History:  Diagnosis Date  . Alzheimer disease ([place])   . Anxiety   . Arthritis   . Atrophic gastritis    a. By EGD 02/2013.  . Carotid artery disease ([place])    a. mild-mod plaque, <50% stenosis bilat by duplex 2018.  [name] Coronary atherosclerosis of native coronary artery    a. Multivessel s/p CABG 1996. b. s/p DES x 2 SVG to PDA 8/12 with distal disease managed medically.  . DDD (degenerative disc disease)    Chronic back pain  . Enlarged prostate   . Essential hypertension   . Hematuria   . Hypothyroidism   . LBBB (left bundle branch block)   . MI (myocardial infarction) ([place])   . Mixed hyperlipidemia   . OA (osteoarthritis)   . OSA (obstructive sleep apnea)   . Pneumonia due to COVID-19 virus    February 2021  . Sinus bradycardia    a. Aricept and BB discontinued due to this.    Past Surgical History:  Procedure Laterality Date  . COLONOSCOPY  08/03/2004   Jenkins-numerous large diverticula in the descending, transverse, descending, and sigmoid colon. Otherwise normal exam.  . COLONOSCOPY  07/12/2012   RMR: External  hemorrhoidal tag; multiple rectal and colonic polyps removed and/or treated as described above. Pancolonic diverticulosis. Bx-tubular adenomas, rectal hyperplastic polyp. next colonoscopy in 06/2015.  [name] COLONOSCOPY N/A 06/22/2016   Procedure: COLONOSCOPY;  Surgeon: Aviva Signs, MD;  [facility];  Service: Gastroenterology;  Laterality: N/A;  730  . CORONARY ANGIOPLASTY WITH STENT PLACEMENT    . CORONARY ARTERY BYPASS GRAFT  1996   LIMA to LAD, SVG to D2, SVG to PDA, SVG to OM1 and OM2  . CORONARY STENT INTERVENTION N/A 12/10/2019   Procedure: CORONARY STENT INTERVENTION;  Surgeon: Burnell Blanks, MD;  Location: [place] [facility];  Service: Cardiovascular;  Laterality: N/A;  . ESOPHAGOGASTRODUODENOSCOPY N/A 03/16/2013   Procedure: ESOPHAGOGASTRODUODENOSCOPY (EGD);  Surgeon: Daneil Dolin, MD;  Location: [facility];  Service: Endoscopy;  Laterality: N/A;  12:00-moved to [place] notified pt  . ESOPHAGOGASTRODUODENOSCOPY N/A 03/06/2013   Procedure: ESOPHAGOGASTRODUODENOSCOPY (EGD);  Surgeon: Daneil Dolin, MD;  [facility]: [facility];  Service: Endoscopy;  Laterality: N/A;  . HERNIA REPAIR    . INTRAVASCULAR ULTRASOUND/IVUS N/A 12/10/2019   Procedure: Intravascular Ultrasound/IVUS;  Surgeon: Burnell Blanks, MD;  Location: [place] [facility];  Service: Cardiovascular;  Laterality: N/A;  . LEFT HEART CATH AND CORS/GRAFTS ANGIOGRAPHY N/A 12/10/2019   Procedure: LEFT HEART CATH AND CORS/GRAFTS ANGIOGRAPHY;  Surgeon: Lauree Chandler  D, MD;  Location: [place] [facility];  Service: Cardiovascular;  Laterality: N/A;    Current Outpatient Medications  Medication Sig Dispense Refill  . albuterol (PROVENTIL) (2.5 MG/3ML) 0.083% nebulizer solution Take 3 mLs (2.5 mg total) by nebulization every 6 (six) hours as needed for wheezing or shortness of breath. Patient will also need the nebulizer machine with this prescription. 75 mL 3  . albuterol (VENTOLIN HFA) 108  (90 Base) MCG/ACT inhaler Inhale 2 puffs into the lungs every 6 (six) hours as needed for wheezing or shortness of breath.    [name] aspirin EC 81 MG tablet Take 81 mg by mouth daily.    . busPIRone (BUSPAR) 10 MG tablet Take 10 mg by mouth 2 (two) times daily.     . clopidogrel (PLAVIX) 75 MG tablet Take 1 tablet (75 mg total) by mouth daily. 90 tablet 3  . dutasteride (AVODART) 0.5 MG capsule Take 0.5 mg by mouth. In the afternoon    . escitalopram (LEXAPRO) 20 MG tablet Take 20 mg by mouth every morning.     . furosemide (LASIX) 20 MG tablet Take 20 mg by mouth 2 (two) times daily.     . hydrOXYzine (ATARAX/VISTARIL) 25 MG tablet Take 25 mg by mouth 4 (four) times daily as needed for anxiety.     . isosorbide mononitrate (IMDUR) 30 MG 24 hr tablet Take 1 tablet (30 mg total) by mouth daily. 90 tablet 1  . levothyroxine (SYNTHROID, LEVOTHROID) 50 MCG tablet Take 50 mcg by mouth every morning.     . memantine (NAMENDA) 10 MG tablet Take 10 mg by mouth 2 (two) times daily.     . nitroGLYCERIN (NITROSTAT) 0.4 MG SL tablet Place 1 tablet (0.4 mg total) under the tongue every 5 (five) minutes as needed for chest pain. PLACE ONE TABLET UNDER TONGUE EVERY 5 MIN UP TO 3 DOSES AS NEEDED FORCHEST PAIN. 25 tablet 3  . oxycodone (ROXICODONE) 30 MG immediate release tablet Take 30 mg by mouth every 4 (four) hours as needed for pain. Take 1 tablet by mouth every 4 to 6 hours as needed for pain.  Max 8 /24 hours.    . pantoprazole (PROTONIX) 40 MG tablet Take 1 tablet (40 mg total) by mouth every morning. 30 tablet 1  . potassium chloride SA (K-DUR,KLOR-CON) 20 MEQ tablet Take 20 mEq by mouth daily.     . QUEtiapine (SEROQUEL) 25 MG tablet Take 25 mg by mouth at bedtime.    . tamsulosin (FLOMAX) 0.4 MG CAPS capsule Take 1 capsule (0.4 mg total) by mouth daily. To help you pee. 30 capsule 3  . zinc sulfate 220 (50 Zn) MG capsule Take 1 capsule (220 mg total) by mouth daily. 30 capsule 1  . rosuvastatin (CRESTOR) 40  MG tablet Take 1 tablet (40 mg total) by mouth daily at 6 PM. 30 tablet 0   No current facility-administered medications for this visit.   Allergies:  Patient has no known allergies.   ROS:  Memory loss.  Physical Exam: VS:  BP (!) 126/64   Pulse 60   Ht 5\' 10"  (1.778 m)   Wt (!) 206 lb (93.4 kg)   SpO2 96%   BMI 29.56 kg/m , BMI Body mass index is 29.56 kg/m.  Wt Readings from Last 3 Encounters:  07/25/20 (!) 206 lb (93.4 kg)  06/12/20 215 lb (97.5 kg)  04/26/20 203 lb 14.8 oz (92.5 kg)    General:  Elderly male, appears  comfortable at rest. HEENT: Conjunctiva and lids normal, wearing a mask. Neck: Supple, no elevated JVP or carotid bruits, no thyromegaly. Lungs: Clear to auscultation, nonlabored breathing at rest. Cardiac: Regular rate and rhythm, no S3 or significant systolic murmur. Extremities: No pitting edema, distal pulses 2+.  ECG:  An ECG dated 06/12/2020 was personally reviewed today and demonstrated:  Sinus rhythm with incomplete left bundle branch block.  Recent Labwork: 12/10/2019: TSH 6.580 02/13/2020: B Natriuretic Peptide 44.0 02/16/2020: Magnesium 2.1 04/26/2020: ALT 17; AST 21 06/12/2020: BUN 15; Creatinine, Ser 1.02; Hemoglobin 12.2; Platelets 234; Potassium 3.3; Sodium 136     Component Value Date/Time   CHOL 143 12/11/2019 0438   TRIG 83 02/13/2020 0229   HDL 37 (L) 12/11/2019 0438   CHOLHDL 3.9 12/11/2019 0438   VLDL 29 12/11/2019 0438   LDLCALC 77 12/11/2019 0438    Other Studies Reviewed Today:  Cardiac catheterization 12/10/2019:  Origin lesion is 100% stenosed.  Mid Graft to Dist Graft lesion is 100% stenosed.  Mid RCA lesion is 100% stenosed.  Prox Graft lesion is 100% stenosed.  Mid LAD lesion is 99% stenosed.  Prox Cx to Mid Cx lesion is 100% stenosed.  SVG graft was visualized by angiography.  SVG graft was visualized by angiography.  Origin to Prox Graft lesion is 100% stenosed.  SVG graft was visualized by  angiography.  LIMA graft was visualized by angiography.  Ost LM to Mid LM lesion is 90% stenosed.  A drug-eluting stent was successfully placed using a STENT RESOLUTE ONYX 4.5X18.  Post intervention, there is a 0% residual stenosis.  1. Severe triple vessel CAD s/p 5V CABG with 2/5 patent bypass grafts.  2. The left main artery has a moderate ostial stenosis and a severe mid stenosis.  3. The LAD has severe proximal to mid stenosis beyond the first large caliber diagonal branch. The LIMA to the LAD is patent. The vein graft to the second diagonal branch is occluded. The second diagonal branch fills from flow through the LIMA to the LAD. The first diagonal branch is not protected by the LIMA graft.  4. The ramus intermediate branch is a moderate caliber vessel and is not protected by grafts.  5. The Circumflex is a moderate caliber vessel with chronic mid occlusion. The sequential vein graft to OM1 and OM2 is patent to OM1 but the sequential segment of the graft to OM2 is occluded. OM2 fills from left to left collaterals.  6. The RCA is a large caliber vessel with chronic mid occlusion. The vein graft to the PDA is now occluded. The distal RCA and it's branches fill from left to right collaterals supplied through the LIMA to the LAD.  7. Successful PTCA/DES x 1 ostial to to distal left main artery.   Recommendations: Continue ASA and Plavix for lifetime given the left main stenting. Continue statin and Imdur. Anticipate discharge home tomorrow.   Echocardiogram 12/10/2019: 1. Left ventricular ejection fraction, by visual estimation, is 55 to  60%. The left ventricle has normal function. There is no left ventricular  hypertrophy.  2. Left ventricular diastolic parameters are consistent with Grade I  diastolic dysfunction (impaired relaxation).  3. Severely dilated left ventricular internal cavity size.  4. The left ventricle has no regional wall motion abnormalities.  5. No change in  LVEF from echo done in 2019.  6. Global right ventricle has normal systolic function.The right  ventricular size is normal. No increase in right ventricular wall  thickness.  7. Left atrial size was mildly dilated.  8. Right atrial size was mildly dilated.  9. The mitral valve is normal in structure. Mild mitral valve  regurgitation.  10. The tricuspid valve is grossly normal. Tricuspid valve regurgitation  is trivial.  11. The aortic valve is tricuspid. Aortic valve regurgitation is not  visualized. Mild aortic valve sclerosis without stenosis.  12. The pulmonic valve was grossly normal. Pulmonic valve regurgitation is  trivial.  13. Normal pulmonary artery systolic pressure.  14. The interatrial septum was not assessed.   Assessment and Plan:  1.  Multivessel CAD status post CABG with subsequently documented graft disease most recently status post DES to the ostial to distal left main in the setting of occlusion of 3 of 5 bypass grafts in December 2020.  He reports good angina control at this time on medical therapy including aspirin, Plavix, Crestor, and Imdur.  2.  Mixed hyperlipidemia, continue Crestor, last LDL 77.  3.  History of hypertension, he has had fluctuations in blood pressure and is not on any standing antihypertensives at this time.  Systolic in the 829H today.  Medication Adjustments/Labs and Tests Ordered: Current medicines are reviewed at length with the patient today.  Concerns regarding medicines are outlined above.   Tests Ordered: No orders of the defined types were placed in this encounter.   Medication Changes: No orders of the defined types were placed in this encounter.   Disposition:  Follow up 6 months in the [place] office.  Signed, Satira Sark, MD, [facility] 07/25/2020 1:42 PM    [facility] [address], [place], [place] [address] Phone: [phone]; Fax: [phone]

## 2020-07-28 ENCOUNTER — Other Ambulatory Visit: Payer: Self-pay

## 2020-07-28 ENCOUNTER — Encounter (INDEPENDENT_AMBULATORY_CARE_PROVIDER_SITE_OTHER): Payer: Self-pay | Admitting: Ophthalmology

## 2020-07-28 ENCOUNTER — Ambulatory Visit (INDEPENDENT_AMBULATORY_CARE_PROVIDER_SITE_OTHER): Payer: PPO | Admitting: Ophthalmology

## 2020-07-28 DIAGNOSIS — H35351 Cystoid macular degeneration, right eye: Secondary | ICD-10-CM

## 2020-07-28 DIAGNOSIS — H25812 Combined forms of age-related cataract, left eye: Secondary | ICD-10-CM

## 2020-07-28 DIAGNOSIS — I10 Essential (primary) hypertension: Secondary | ICD-10-CM

## 2020-07-28 DIAGNOSIS — D3132 Benign neoplasm of left choroid: Secondary | ICD-10-CM | POA: Diagnosis not present

## 2020-07-28 DIAGNOSIS — H353122 Nonexudative age-related macular degeneration, left eye, intermediate dry stage: Secondary | ICD-10-CM | POA: Diagnosis not present

## 2020-07-28 DIAGNOSIS — H3581 Retinal edema: Secondary | ICD-10-CM

## 2020-07-28 DIAGNOSIS — H35033 Hypertensive retinopathy, bilateral: Secondary | ICD-10-CM

## 2020-07-28 DIAGNOSIS — H35371 Puckering of macula, right eye: Secondary | ICD-10-CM

## 2020-07-28 DIAGNOSIS — Z961 Presence of intraocular lens: Secondary | ICD-10-CM

## 2020-07-28 MED ORDER — PREDNISOLONE ACETATE 1 % OP SUSP
1.0000 [drp] | Freq: Four times a day (QID) | OPHTHALMIC | 0 refills | Status: DC
Start: 2020-07-28 — End: 2020-08-28

## 2020-07-28 MED ORDER — PROLENSA 0.07 % OP SOLN: 1.0000 [drp] | Freq: Four times a day (QID) | OPHTHALMIC | 3 refills | Status: DC

## 2020-07-29 DIAGNOSIS — I209 Angina pectoris, unspecified: Secondary | ICD-10-CM | POA: Diagnosis not present

## 2020-07-29 DIAGNOSIS — Z6829 Body mass index (BMI) 29.0-29.9, adult: Secondary | ICD-10-CM | POA: Diagnosis not present

## 2020-07-29 DIAGNOSIS — M1991 Primary osteoarthritis, unspecified site: Secondary | ICD-10-CM | POA: Diagnosis not present

## 2020-07-29 DIAGNOSIS — J329 Chronic sinusitis, unspecified: Secondary | ICD-10-CM | POA: Diagnosis not present

## 2020-07-29 DIAGNOSIS — G894 Chronic pain syndrome: Secondary | ICD-10-CM | POA: Diagnosis not present

## 2020-08-05 DIAGNOSIS — I781 Nevus, non-neoplastic: Secondary | ICD-10-CM | POA: Diagnosis not present

## 2020-08-05 DIAGNOSIS — L821 Other seborrheic keratosis: Secondary | ICD-10-CM | POA: Diagnosis not present

## 2020-08-16 DIAGNOSIS — U071 COVID-19: Secondary | ICD-10-CM | POA: Diagnosis not present

## 2020-08-16 DIAGNOSIS — J449 Chronic obstructive pulmonary disease, unspecified: Secondary | ICD-10-CM | POA: Diagnosis not present

## 2020-08-25 ENCOUNTER — Encounter (INDEPENDENT_AMBULATORY_CARE_PROVIDER_SITE_OTHER): Payer: PPO | Admitting: Ophthalmology

## 2020-08-27 NOTE — Progress Notes (Signed)
[facility] Note  08/28/2020     CHIEF COMPLAINT Patient presents for Retina Follow Up   HISTORY OF PRESENT ILLNESS: Dustin [name] is a 86 y.o. male who presents to the clinic today for:   HPI    Retina Follow Up    Patient presents with  Other.  In right eye.  This started 4 weeks ago.  Severity is moderate.  I, the attending physician,  performed the HPI with the patient and updated documentation appropriately.          Comments    Patient here for 4 weeks retina follow up for MCME OD. Patient states vision no changes since last visit. No eye pain. Cant see to get adjusted to light. Cant read labels on bottles.       Last edited by Bernarda Caffey, MD on 08/28/2020  3:37 PM. (History)    pt states he can't see a thing, states he is "blind as a bat", he states he can't see to drive a night at all, pt is he can only get is wife to put the PF and Prolensa 2-3 times a day   HISTORICAL INFORMATION:   Selected notes from the MEDICAL RECORD NUMBER Pt of Dr. Zigmund Daniel LEE: 06.29.20 BCVA: OD 20/20, OS 20/30 Ocular Hx- CME OD, EMR OD, HTN Ret. OU, Choroidal Nevus OS, NS OS, ARMD OS, Pseudo OD    CURRENT MEDICATIONS: Current Outpatient Medications (Ophthalmic Drugs)  Medication Sig  . Bromfenac Sodium (PROLENSA) 0.07 % SOLN Place 1 drop into the right eye 4 (four) times daily.  . prednisoLONE acetate (PRED FORTE) 1 % ophthalmic suspension Place 1 drop into the right eye 4 (four) times daily.   No current facility-administered medications for this visit. (Ophthalmic Drugs)   Current Outpatient Medications (Other)  Medication Sig  . albuterol (PROVENTIL) (2.5 MG/3ML) 0.083% nebulizer solution Take 3 mLs (2.5 mg total) by nebulization every 6 (six) hours as needed for wheezing or shortness of breath. Patient will also need the nebulizer machine with this prescription.  [name] albuterol (VENTOLIN HFA) 108 (90 Base) MCG/ACT inhaler Inhale 2 puffs into the lungs  every 6 (six) hours as needed for wheezing or shortness of breath.  [name] aspirin EC 81 MG tablet Take 81 mg by mouth daily.  . busPIRone (BUSPAR) 10 MG tablet Take 10 mg by mouth 2 (two) times daily.   . clopidogrel (PLAVIX) 75 MG tablet Take 1 tablet (75 mg total) by mouth daily.  [name] dutasteride (AVODART) 0.5 MG capsule Take 0.5 mg by mouth. In the afternoon  . escitalopram (LEXAPRO) 20 MG tablet Take 20 mg by mouth every morning.   . furosemide (LASIX) 20 MG tablet Take 20 mg by mouth 2 (two) times daily.   . hydrOXYzine (ATARAX/VISTARIL) 25 MG tablet Take 25 mg by mouth 4 (four) times daily as needed for anxiety.   . isosorbide mononitrate (IMDUR) 30 MG 24 hr tablet Take 1 tablet (30 mg total) by mouth daily.  [name] levothyroxine (SYNTHROID, LEVOTHROID) 50 MCG tablet Take 50 mcg by mouth every morning.   . memantine (NAMENDA) 10 MG tablet Take 10 mg by mouth 2 (two) times daily.   . nitroGLYCERIN (NITROSTAT) 0.4 MG SL tablet Place 1 tablet (0.4 mg total) under the tongue every 5 (five) minutes as needed for chest pain. PLACE ONE TABLET UNDER TONGUE EVERY 5 MIN UP TO 3 DOSES AS NEEDED FORCHEST PAIN.  [name] oxycodone (ROXICODONE) 30 MG immediate  release tablet Take 30 mg by mouth every 4 (four) hours as needed for pain. Take 1 tablet by mouth every 4 to 6 hours as needed for pain.  Max 8 /24 hours.  . pantoprazole (PROTONIX) 40 MG tablet Take 1 tablet (40 mg total) by mouth every morning.  . potassium chloride SA (K-DUR,KLOR-CON) 20 MEQ tablet Take 20 mEq by mouth daily.   . QUEtiapine (SEROQUEL) 25 MG tablet Take 25 mg by mouth at bedtime.  . rosuvastatin (CRESTOR) 40 MG tablet Take 1 tablet (40 mg total) by mouth daily at 6 PM.  . tamsulosin (FLOMAX) 0.4 MG CAPS capsule Take 1 capsule (0.4 mg total) by mouth daily. To help you pee.  . zinc sulfate 220 (50 Zn) MG capsule Take 1 capsule (220 mg total) by mouth daily.   No current facility-administered medications for this visit. (Other)      REVIEW OF  SYSTEMS: ROS    Positive for: Eyes   Last edited by Theodore Demark, COA on 08/28/2020  1:54 PM. (History)       ALLERGIES No Known Allergies  PAST MEDICAL HISTORY Past Medical History:  Diagnosis Date  . Alzheimer disease ([place])   . Anxiety   . Arthritis   . Atrophic gastritis    a. By EGD 02/2013.  . Carotid artery disease ([place])    a. mild-mod plaque, <50% stenosis bilat by duplex 2018.  [name] Coronary atherosclerosis of native coronary artery    a. Multivessel s/p CABG 1996. b. s/p DES x 2 SVG to PDA 8/12 with distal disease managed medically.  . DDD (degenerative disc disease)    Chronic back pain  . Enlarged prostate   . Essential hypertension   . Hematuria   . Hypothyroidism   . LBBB (left bundle branch block)   . MI (myocardial infarction) ([employer])   . Mixed hyperlipidemia   . OA (osteoarthritis)   . OSA (obstructive sleep apnea)   . Pneumonia due to COVID-19 virus    February 2021  . Sinus bradycardia    a. Aricept and BB discontinued due to this.   Past Surgical History:  Procedure Laterality Date  . COLONOSCOPY  08/03/2004   Jenkins-numerous large diverticula in the descending, transverse, descending, and sigmoid colon. Otherwise normal exam.  . COLONOSCOPY  07/12/2012   RMR: External hemorrhoidal tag; multiple rectal and colonic polyps removed and/or treated as described above. Pancolonic diverticulosis. Bx-tubular adenomas, rectal hyperplastic polyp. next colonoscopy in 06/2015.  [name] COLONOSCOPY N/A 06/22/2016   Procedure: COLONOSCOPY;  Surgeon: Aviva Signs, MD;  [facility];  Service: Gastroenterology;  Laterality: N/A;  730  . CORONARY ANGIOPLASTY WITH STENT PLACEMENT    . CORONARY ARTERY BYPASS GRAFT  1996   LIMA to LAD, SVG to D2, SVG to PDA, SVG to OM1 and OM2  . CORONARY STENT INTERVENTION N/A 12/10/2019   Procedure: CORONARY STENT INTERVENTION;  Surgeon: Burnell Blanks, MD;  Location: [facility];  Service: Cardiovascular;   Laterality: N/A;  . ESOPHAGOGASTRODUODENOSCOPY N/A 03/16/2013   Procedure: ESOPHAGOGASTRODUODENOSCOPY (EGD);  Surgeon: Daneil Dolin, MD;  Location: [facility];  Service: Endoscopy;  Laterality: N/A;  12:00-moved to [place] notified pt  . ESOPHAGOGASTRODUODENOSCOPY N/A 03/06/2013   Procedure: ESOPHAGOGASTRODUODENOSCOPY (EGD);  Surgeon: Daneil Dolin, MD;  [facility]: [facility];  Service: Endoscopy;  Laterality: N/A;  . HERNIA REPAIR    . INTRAVASCULAR ULTRASOUND/IVUS N/A 12/10/2019   Procedure: Intravascular Ultrasound/IVUS;  Surgeon: Lauree Chandler  D, MD;  Location: [facility];  Service: Cardiovascular;  Laterality: N/A;  . LEFT HEART CATH AND CORS/GRAFTS ANGIOGRAPHY N/A 12/10/2019   Procedure: LEFT HEART CATH AND CORS/GRAFTS ANGIOGRAPHY;  Surgeon: Burnell Blanks, MD;  Location: [facility];  Service: Cardiovascular;  Laterality: N/A;    FAMILY HISTORY Family History  Problem Relation Age of Onset  . Heart disease Other   . Heart attack Mother   . Colon cancer Neg Hx     SOCIAL HISTORY Social History   Tobacco Use  . Smoking status: Former Smoker    Packs/day: 2.00    Years: 40.00    Pack years: 80.00    Types: Cigarettes    Quit date: 12/27/1994    Years since quitting: 25.6  . Smokeless tobacco: Never Used  Vaping Use  . Vaping Use: Never used  Substance Use Topics  . Alcohol use: No    Alcohol/week: 0.0 standard drinks  . Drug use: No         OPHTHALMIC EXAM:  Base Eye Exam    Visual Acuity (Snellen - Linear)      Right Left   Dist [place] 20/25 -2 20/40 -1   Dist ph [place] NI 20/30 -1       Tonometry (Tonopen, 1:50 PM)      Right Left   Pressure 12 12       Pupils      Dark Light Shape React APD   Right 3 2 Round Brisk None   Left 3 2 Round Brisk None       Visual Fields (Counting fingers)      Left Right    Full Full       Extraocular Movement      Right Left    Full, Ortho Full, Ortho       Neuro/Psych     Oriented x3: Yes   Mood/Affect: Normal       Dilation    Both eyes: 1.0% Mydriacyl, 2.5% Phenylephrine @ 1:50 PM        Slit Lamp and Fundus Exam    Slit Lamp Exam      Right Left   Lids/Lashes Dermatochalasis - upper lid, Meibomian gland dysfunction Dermatochalasis - upper lid, Meibomian gland dysfunction   Conjunctiva/Sclera White and quiet White and quiet   Cornea arcus, trace Punctate epithelial erosions arcus, trace Punctate epithelial erosions   Anterior Chamber Deep and quiet Deep and quiet   Iris Round and dilated Round and moderately dilated to 5.99m   Lens PC IOL in good position 2-3+ Nuclear sclerosis with brunescence, 2+ Cortical cataract   Vitreous Vitreous syneresis Vitreous syneresis, Posterior vitreous detachment       Fundus Exam      Right Left   Disc Mild Pallor, Sharp rim Pink and Sharp   C/D Ratio 0.5 0.5   Macula Flat, Blunted foveal reflex, +ERM, interval improvement in Cystic changes, RPE mottling Flat, Blunted foveal reflex, RPE mottling, clumping and early atrophy   Vessels Vascular attenuation, mild tortuousity Vascular attenuation, mild tortuousity   Periphery Attached, reticular degeneration, No heme  Attached, amelonotic choroidal nevus along distal IT arcades, reticular degeneration, No heme           IMAGING AND PROCEDURES  Imaging and Procedures for 08/28/2020  OCT, Retina - OU - Both Eyes       Right Eye Quality was good. Central Foveal Thickness: 321. Progression has improved. Findings include abnormal foveal  contour, epiretinal membrane, intraretinal fluid, no SRF (Interval improvmenent in cystic changes and foveal profile).   Left Eye Quality was good. Central Foveal Thickness: 283. Progression has been stable. Findings include intraretinal hyper-reflective material, abnormal foveal contour, no IRF, no SRF, epiretinal membrane, retinal drusen  (Focal ellipsoid disruption with IRHM IN fovea).   Notes *Images captured and stored on  drive  Diagnosis / Impression:  OD: ERM; Interval improvmenent in cystic changes and foveal profile OS: nonexudative ARMD w/ focal ellipsoid disruption and IRHM inf nasal fovea; no IRF/SRF  Clinical management:  See below  Abbreviations: NFP - Normal foveal profile. CME - cystoid macular edema. PED - pigment epithelial detachment. IRF - intraretinal fluid. SRF - subretinal fluid. EZ - ellipsoid zone. ERM - epiretinal membrane. ORA - outer retinal atrophy. ORT - outer retinal tubulation. SRHM - subretinal hyper-reflective material. IRHM - intraretinal hyper-reflective material                 ASSESSMENT/PLAN:    ICD-10-CM   1. Cystoid macular edema of right eye  H35.351   2. Retinal edema  H35.81 OCT, Retina - OU - Both Eyes  3. Epiretinal membrane (ERM) of right eye  H35.371   4. Intermediate stage nonexudative age-related macular degeneration of left eye  H35.3122   5. Nevus of choroid of left eye  D31.32   6. Essential hypertension  I10   7. Hypertensive retinopathy of both eyes  H35.033   8. Pseudophakia of right eye  Z96.1   9. Combined forms of age-related cataract of left eye  H25.812     1,2. CME OD  - Zigmund Daniel pt, lost to f/u since June 2020  - hx of RBB kenalog 08.15.18  - was on Prolensa QHS OD for maintenance, but has been out of drops for months  - restarted Prolensa and PF QID OD 8.2.21  - OCT shows interval improvement in IRF/cystic changes  - BCVA stable at 20/25  - cont Prolensa and PF QID OD  - f/u 4 weeks, DFE, OCT  3. Epiretinal membrane, right eye  - mild ERM - BCVA 20/25 - asymptomatic, no metamorphopsia - no indication for surgery at this time - monitor for now  4. Age related macular degeneration, non-exudative, left eye  - The incidence, anatomy, and pathology of dry AMD, risk of progression, and the AREDS and AREDS 2 study including smoking risks discussed with patient.  - recommend Amsler grid monitoring  5. Choroidal nevus OS  -  amelanotic nevus along distal ST arcades  - no SRF or orange pigment  - no elevation  - monitor  6,7. Hypertensive retinopathy OU - discussed importance of tight BP control - monitor  8. Pseudophakia OD  - s/p CE/IOL OD ([place])  - IOL in good position, doing well  - monitor  9. Mixed Cataract OS - The symptoms of cataract, surgical options, and treatments and risks were discussed with patient. - discussed diagnosis and progression  - approaching visual significance  - under the expert management of [place] Ordered this visit:  Meds ordered this encounter  Medications  . Bromfenac Sodium (PROLENSA) 0.07 % SOLN    Sig: Place 1 drop into the right eye 4 (four) times daily.    Dispense:  3 mL    Refill:  3  . prednisoLONE acetate (PRED FORTE) 1 % ophthalmic suspension    Sig: Place 1 drop into the right eye 4 (four) times daily.  Dispense:  15 mL    Refill:  0       Return in about 4 weeks (around 09/25/2020) for f/u CME OD, DFE, OCT.  There are no Patient Instructions on file for this visit.   Explained the diagnoses, plan, and follow up with the patient and they expressed understanding.  Patient expressed understanding of the importance of proper follow up care.   This document serves as a record of services personally performed by Gardiner Sleeper, MD, PhD. It was created on their behalf by Roselee Nova, COMT. The creation of this record is the provider's dictation and/or activities during the visit.  Electronically signed by: Roselee Nova, COMT 08/30/20 12:41 AM  This document serves as a record of services personally performed by Gardiner Sleeper, MD, PhD. It was created on their behalf by San Jetty. Owens Shark, OA an ophthalmic technician. The creation of this record is the provider's dictation and/or activities during the visit.    Electronically signed by: San Jetty. Owens Shark, [place] 09.02.2021 12:41 AM   Gardiner Sleeper, M.D., Ph.D. Diseases &  Surgery of the Retina and Vitreous [name]  I have reviewed the above documentation for accuracy and completeness, and I agree with the above. Gardiner Sleeper, M.D., Ph.D. 08/30/20 12:53 AM   Abbreviations: M myopia (nearsighted); A astigmatism; H hyperopia (farsighted); P presbyopia; Mrx spectacle prescription;  CTL contact lenses; OD right eye; OS left eye; OU both eyes  XT exotropia; ET esotropia; PEK punctate epithelial keratitis; PEE punctate epithelial erosions; DES dry eye syndrome; MGD meibomian gland dysfunction; ATs artificial tears; PFAT's preservative free artificial tears; [place] nuclear sclerotic cataract; PSC posterior subcapsular cataract; ERM epi-retinal membrane; PVD posterior vitreous detachment; RD retinal detachment; DM diabetes mellitus; DR diabetic retinopathy; NPDR non-proliferative diabetic retinopathy; PDR proliferative diabetic retinopathy; CSME clinically significant macular edema; DME diabetic macular edema; dbh dot blot hemorrhages; CWS cotton wool spot; POAG primary open angle glaucoma; C/D cup-to-disc ratio; HVF humphrey visual field; GVF goldmann visual field; OCT optical coherence tomography; IOP intraocular pressure; BRVO Branch retinal vein occlusion; CRVO central retinal vein occlusion; CRAO central retinal artery occlusion; BRAO branch retinal artery occlusion; RT retinal tear; SB scleral buckle; PPV pars plana vitrectomy; VH Vitreous hemorrhage; PRP panretinal laser photocoagulation; IVK intravitreal kenalog; VMT vitreomacular traction; MH Macular hole;  NVD neovascularization of the disc; NVE neovascularization elsewhere; AREDS age related eye disease study; ARMD age related macular degeneration; POAG primary open angle glaucoma; EBMD epithelial/anterior basement membrane dystrophy; ACIOL anterior chamber intraocular lens; IOL intraocular lens; PCIOL posterior chamber intraocular lens; Phaco/IOL phacoemulsification with intraocular lens  placement; [place] photorefractive keratectomy; LASIK laser assisted in situ keratomileusis; HTN hypertension; DM diabetes mellitus; COPD chronic obstructive pulmonary disease

## 2020-08-28 ENCOUNTER — Ambulatory Visit (INDEPENDENT_AMBULATORY_CARE_PROVIDER_SITE_OTHER): Payer: PPO | Admitting: Ophthalmology

## 2020-08-28 ENCOUNTER — Encounter (INDEPENDENT_AMBULATORY_CARE_PROVIDER_SITE_OTHER): Payer: Self-pay | Admitting: Ophthalmology

## 2020-08-28 ENCOUNTER — Other Ambulatory Visit: Payer: Self-pay

## 2020-08-28 DIAGNOSIS — H3581 Retinal edema: Secondary | ICD-10-CM | POA: Diagnosis not present

## 2020-08-28 DIAGNOSIS — H35351 Cystoid macular degeneration, right eye: Secondary | ICD-10-CM | POA: Diagnosis not present

## 2020-08-28 DIAGNOSIS — H25812 Combined forms of age-related cataract, left eye: Secondary | ICD-10-CM | POA: Diagnosis not present

## 2020-08-28 DIAGNOSIS — Z961 Presence of intraocular lens: Secondary | ICD-10-CM

## 2020-08-28 DIAGNOSIS — I10 Essential (primary) hypertension: Secondary | ICD-10-CM | POA: Diagnosis not present

## 2020-08-28 DIAGNOSIS — D3132 Benign neoplasm of left choroid: Secondary | ICD-10-CM

## 2020-08-28 DIAGNOSIS — H353122 Nonexudative age-related macular degeneration, left eye, intermediate dry stage: Secondary | ICD-10-CM

## 2020-08-28 DIAGNOSIS — H35033 Hypertensive retinopathy, bilateral: Secondary | ICD-10-CM

## 2020-08-28 DIAGNOSIS — H35371 Puckering of macula, right eye: Secondary | ICD-10-CM | POA: Diagnosis not present

## 2020-08-28 MED ORDER — PREDNISOLONE ACETATE 1 % OP SUSP: 1.0000 [drp] | Freq: Four times a day (QID) | OPHTHALMIC | 0 refills | Status: DC

## 2020-08-28 MED ORDER — PROLENSA 0.07 % OP SOLN: 1.0000 [drp] | Freq: Four times a day (QID) | OPHTHALMIC | 3 refills | Status: DC

## 2020-09-03 ENCOUNTER — Other Ambulatory Visit: Payer: Self-pay | Admitting: Cardiology

## 2020-09-09 DIAGNOSIS — R3129 Other microscopic hematuria: Secondary | ICD-10-CM | POA: Diagnosis not present

## 2020-09-09 DIAGNOSIS — Z6830 Body mass index (BMI) 30.0-30.9, adult: Secondary | ICD-10-CM | POA: Diagnosis not present

## 2020-09-09 DIAGNOSIS — G894 Chronic pain syndrome: Secondary | ICD-10-CM | POA: Diagnosis not present

## 2020-09-09 DIAGNOSIS — E6609 Other obesity due to excess calories: Secondary | ICD-10-CM | POA: Diagnosis not present

## 2020-09-21 ENCOUNTER — Emergency Department (HOSPITAL_COMMUNITY)
Admission: EM | Admit: 2020-09-21 | Discharge: 2020-09-21 | Disposition: A | Discharge status: Home / Self Care | Payer: PPO | Attending: Emergency Medicine | Admitting: Emergency Medicine

## 2020-09-21 ENCOUNTER — Emergency Department (HOSPITAL_COMMUNITY): Payer: PPO

## 2020-09-21 ENCOUNTER — Other Ambulatory Visit: Payer: Self-pay

## 2020-09-21 DIAGNOSIS — J441 Chronic obstructive pulmonary disease with (acute) exacerbation: Secondary | ICD-10-CM | POA: Insufficient documentation

## 2020-09-21 DIAGNOSIS — J9811 Atelectasis: Secondary | ICD-10-CM | POA: Diagnosis not present

## 2020-09-21 DIAGNOSIS — Z87891 Personal history of nicotine dependence: Secondary | ICD-10-CM | POA: Diagnosis not present

## 2020-09-21 DIAGNOSIS — R6889 Other general symptoms and signs: Secondary | ICD-10-CM | POA: Diagnosis not present

## 2020-09-21 DIAGNOSIS — Z79890 Hormone replacement therapy (postmenopausal): Secondary | ICD-10-CM | POA: Diagnosis not present

## 2020-09-21 DIAGNOSIS — I5032 Chronic diastolic (congestive) heart failure: Secondary | ICD-10-CM | POA: Insufficient documentation

## 2020-09-21 DIAGNOSIS — G309 Alzheimer's disease, unspecified: Secondary | ICD-10-CM | POA: Insufficient documentation

## 2020-09-21 DIAGNOSIS — I2510 Atherosclerotic heart disease of native coronary artery without angina pectoris: Secondary | ICD-10-CM | POA: Insufficient documentation

## 2020-09-21 DIAGNOSIS — Z79899 Other long term (current) drug therapy: Secondary | ICD-10-CM | POA: Diagnosis not present

## 2020-09-21 DIAGNOSIS — R0789 Other chest pain: Secondary | ICD-10-CM | POA: Diagnosis not present

## 2020-09-21 DIAGNOSIS — W19XXXA Unspecified fall, initial encounter: Secondary | ICD-10-CM

## 2020-09-21 DIAGNOSIS — Z7982 Long term (current) use of aspirin: Secondary | ICD-10-CM | POA: Insufficient documentation

## 2020-09-21 DIAGNOSIS — Z951 Presence of aortocoronary bypass graft: Secondary | ICD-10-CM | POA: Diagnosis not present

## 2020-09-21 DIAGNOSIS — U071 COVID-19: Secondary | ICD-10-CM | POA: Diagnosis not present

## 2020-09-21 DIAGNOSIS — R404 Transient alteration of awareness: Secondary | ICD-10-CM | POA: Diagnosis not present

## 2020-09-21 DIAGNOSIS — Z20822 Contact with and (suspected) exposure to covid-19: Secondary | ICD-10-CM | POA: Diagnosis not present

## 2020-09-21 DIAGNOSIS — I110 Hypertensive heart disease with heart failure: Secondary | ICD-10-CM | POA: Insufficient documentation

## 2020-09-21 DIAGNOSIS — F0280 Dementia in other diseases classified elsewhere without behavioral disturbance: Secondary | ICD-10-CM | POA: Diagnosis not present

## 2020-09-21 DIAGNOSIS — Z743 Need for continuous supervision: Secondary | ICD-10-CM | POA: Diagnosis not present

## 2020-09-21 DIAGNOSIS — I700 Atherosclerosis of aorta: Secondary | ICD-10-CM | POA: Diagnosis not present

## 2020-09-21 DIAGNOSIS — E039 Hypothyroidism, unspecified: Secondary | ICD-10-CM | POA: Insufficient documentation

## 2020-09-21 DIAGNOSIS — I6523 Occlusion and stenosis of bilateral carotid arteries: Secondary | ICD-10-CM | POA: Diagnosis not present

## 2020-09-21 DIAGNOSIS — R0689 Other abnormalities of breathing: Secondary | ICD-10-CM | POA: Diagnosis not present

## 2020-09-21 DIAGNOSIS — R519 Headache, unspecified: Secondary | ICD-10-CM | POA: Diagnosis not present

## 2020-09-21 DIAGNOSIS — R079 Chest pain, unspecified: Secondary | ICD-10-CM | POA: Diagnosis present

## 2020-09-21 DIAGNOSIS — R42 Dizziness and giddiness: Secondary | ICD-10-CM | POA: Diagnosis not present

## 2020-09-21 LAB — RESPIRATORY PANEL BY RT PCR (FLU A&B, COVID)
Influenza A by PCR: NEGATIVE
Influenza B by PCR: NEGATIVE
SARS Coronavirus 2 by RT PCR: NEGATIVE

## 2020-09-21 LAB — URINALYSIS, ROUTINE W REFLEX MICROSCOPIC
Bilirubin Urine: NEGATIVE
Glucose, UA: NEGATIVE mg/dL
Hgb urine dipstick: NEGATIVE
Ketones, ur: NEGATIVE mg/dL
Leukocytes,Ua: NEGATIVE
Nitrite: NEGATIVE
Protein, ur: NEGATIVE mg/dL
Specific Gravity, Urine: 1.016 (ref 1.005–1.030)
pH: 5 (ref 5.0–8.0)

## 2020-09-21 LAB — CBC WITH DIFFERENTIAL/PLATELET
Abs Immature Granulocytes: 0.05 10*3/uL (ref 0.00–0.07)
Basophils Absolute: 0.1 10*3/uL (ref 0.0–0.1)
Basophils Relative: 1 %
Eosinophils Absolute: 0.1 10*3/uL (ref 0.0–0.5)
Eosinophils Relative: 1 %
HCT: 43 % (ref 39.0–52.0)
Hemoglobin: 13.8 g/dL (ref 13.0–17.0)
Immature Granulocytes: 1 %
Lymphocytes Relative: 15 %
Lymphs Abs: 1.6 10*3/uL (ref 0.7–4.0)
MCH: 30.5 pg (ref 26.0–34.0)
MCHC: 32.1 g/dL (ref 30.0–36.0)
MCV: 94.9 fL (ref 80.0–100.0)
Monocytes Absolute: 0.8 10*3/uL (ref 0.1–1.0)
Monocytes Relative: 7 %
Neutro Abs: 8.1 10*3/uL — ABNORMAL HIGH (ref 1.7–7.7)
Neutrophils Relative %: 75 %
Platelets: 230 10*3/uL (ref 150–400)
RBC: 4.53 MIL/uL (ref 4.22–5.81)
RDW: 13.4 % (ref 11.5–15.5)
WBC: 10.7 10*3/uL — ABNORMAL HIGH (ref 4.0–10.5)
nRBC: 0 % (ref 0.0–0.2)

## 2020-09-21 LAB — D-DIMER, QUANTITATIVE: D-Dimer, Quant: 7.57 ug/mL-FEU — ABNORMAL HIGH (ref 0.00–0.50)

## 2020-09-21 LAB — COMPREHENSIVE METABOLIC PANEL
ALT: 22 U/L (ref 0–44)
AST: 24 U/L (ref 15–41)
Albumin: 4 g/dL (ref 3.5–5.0)
Alkaline Phosphatase: 53 U/L (ref 38–126)
Anion gap: 10 (ref 5–15)
BUN: 23 mg/dL (ref 8–23)
CO2: 28 mmol/L (ref 22–32)
Calcium: 9.1 mg/dL (ref 8.9–10.3)
Chloride: 98 mmol/L (ref 98–111)
Creatinine, Ser: 1.04 mg/dL (ref 0.61–1.24)
GFR calc Af Amer: >60 mL/min (ref >60)
GFR calc non Af Amer: >60 mL/min (ref >60)
Glucose, Bld: 90 mg/dL (ref 70–99)
Potassium: 3.7 mmol/L (ref 3.5–5.1)
Sodium: 136 mmol/L (ref 135–145)
Total Bilirubin: 1 mg/dL (ref 0.3–1.2)
Total Protein: 6.9 g/dL (ref 6.5–8.1)

## 2020-09-21 LAB — BRAIN NATRIURETIC PEPTIDE: B Natriuretic Peptide: 71 pg/mL (ref 0.0–100.0)

## 2020-09-21 LAB — TROPONIN I (HIGH SENSITIVITY)
Troponin I (High Sensitivity): 11 ng/L (ref <18)
Troponin I (High Sensitivity): 10 ng/L (ref <18)

## 2020-09-21 MED ORDER — IOHEXOL 350 MG/ML SOLN
100.0000 mL | Freq: Once | INTRAVENOUS | Status: AC | PRN
  Administered 2020-09-21: 100 mL via INTRAVENOUS

## 2020-09-21 NOTE — ED Provider Notes (Signed)
[facility] EMERGENCY DEPARTMENT Provider Note   CSN: [id number] Arrival date & time: 09/21/20  1817     History Chief Complaint  Patient presents with  . Chest Pain    Dustin [name] is a 86 y.o. male.  Patient states that he got dizzy in the bathroom fell to the floor no loss of consciousness  The history is provided by the patient and medical records. No language interpreter was used.  Chest Pain Pain location:  L chest Pain quality: aching   Pain radiates to:  Does not radiate Pain severity:  Mild Onset quality:  Sudden Timing:  Intermittent Progression:  Waxing and waning Chronicity:  New Context: not breathing   Associated symptoms: no abdominal pain, no back pain, no cough, no fatigue and no headache        Past Medical History:  Diagnosis Date  . Alzheimer disease ([place])   . Anxiety   . Arthritis   . Atrophic gastritis    a. By EGD 02/2013.  . Carotid artery disease ([place])    a. mild-mod plaque, <50% stenosis bilat by duplex 2018.  [name] Coronary atherosclerosis of native coronary artery    a. Multivessel s/p CABG 1996. b. s/p DES x 2 SVG to PDA 8/12 with distal disease managed medically.  . DDD (degenerative disc disease)    Chronic back pain  . Enlarged prostate   . Essential hypertension   . Hematuria   . Hypothyroidism   . LBBB (left bundle branch block)   . MI (myocardial infarction) ([place])   . Mixed hyperlipidemia   . OA (osteoarthritis)   . OSA (obstructive sleep apnea)   . Pneumonia due to COVID-19 virus    February 2021  . Sinus bradycardia    a. Aricept and BB discontinued due to this.    Patient Active Problem List   Diagnosis Date Noted  . Pneumonia due to COVID-19 virus 02/13/2020  . COPD (chronic obstructive pulmonary disease) ([place]) 12/10/2019  . COPD exacerbation ([place]) 12/07/2019  . Unstable angina ([place]) 11/07/2018  . Fall 11/07/2018  . Gait instability 11/07/2018  . Leukocytosis 11/07/2018  . Depression 11/07/2018  . BPH (benign  prostatic hyperplasia) 11/07/2018  . Chronic pain 11/07/2018  . Pressure injury of skin 02/15/2017  . Chronic diastolic CHF (congestive heart failure) ([place]) 12/24/2016  . Sinus bradycardia 12/23/2016  . Foot pain, bilateral 12/23/2016  . Hypokalemia 04/05/2016  . Acute respiratory failure ([place]) 04/03/2016  . Acute encephalopathy 04/03/2016  . Hypothyroidism 04/03/2016  . Aspiration pneumonia ([place]) 04/03/2016  . Dementia ([place]) 04/03/2016  . Acute respiratory failure with hypoxia ([place]) 04/03/2016  . Memory loss 04/20/2015  . Hypoxia 01/27/2015  . CAP (community acquired pneumonia) 01/27/2015  . Fever 08/30/2014  . Healthcare associated bacterial pneumonia 08/30/2014  . Toxic Metabolic encephalopathy 95/62/1308  . Sepsis ([place]) 08/30/2014  . Neck pain on right side 08/29/2014  . Neck pain 08/29/2014  . Left-sided weakness 06/28/2014  . Chest pain 06/27/2014  . Tubular adenoma of colon 03/05/2013  . Anorexia 03/05/2013  . Loss of weight 03/05/2013  . Chronic constipation 07/04/2012  . Bronchitis 08/16/2011  . Hyperlipidemia 10/01/2009  . OSA (obstructive sleep apnea) 10/01/2009  . Alzheimer disease ([place]) 10/01/2009  . HYPERTENSION, BENIGN 10/01/2009  . Coronary artery disease 10/01/2009    Past Surgical History:  Procedure Laterality Date  . COLONOSCOPY  08/03/2004   Jenkins-numerous large diverticula in the descending, transverse, descending, and sigmoid colon. Otherwise normal exam.  . COLONOSCOPY  07/12/2012   RMR: External hemorrhoidal tag; multiple rectal and colonic polyps removed and/or treated as described above. Pancolonic diverticulosis. Bx-tubular adenomas, rectal hyperplastic polyp. next colonoscopy in 06/2015.  [name] COLONOSCOPY N/A 06/22/2016   Procedure: COLONOSCOPY;  Surgeon: Aviva Signs, MD;  Location: [facility];  Service: Gastroenterology;  Laterality: N/A;  730  . CORONARY ANGIOPLASTY WITH STENT PLACEMENT    . CORONARY ARTERY BYPASS GRAFT  1996   LIMA to LAD,  SVG to D2, SVG to PDA, SVG to OM1 and OM2  . CORONARY STENT INTERVENTION N/A 12/10/2019   Procedure: CORONARY STENT INTERVENTION;  Surgeon: Burnell Blanks, MD;  Location: [place] [facility] LAB;  Service: Cardiovascular;  Laterality: N/A;  . ESOPHAGOGASTRODUODENOSCOPY N/A 03/16/2013   Procedure: ESOPHAGOGASTRODUODENOSCOPY (EGD);  Surgeon: Daneil Dolin, MD;  Location: [facility];  Service: Endoscopy;  Laterality: N/A;  12:00-moved to [place] notified pt  . ESOPHAGOGASTRODUODENOSCOPY N/A 03/06/2013   Procedure: ESOPHAGOGASTRODUODENOSCOPY (EGD);  Surgeon: Daneil Dolin, MD;  Location: [facility];  Service: Endoscopy;  Laterality: N/A;  . HERNIA REPAIR    . INTRAVASCULAR ULTRASOUND/IVUS N/A 12/10/2019   Procedure: Intravascular Ultrasound/IVUS;  Surgeon: Burnell Blanks, MD;  Location: [place] [facility];  Service: Cardiovascular;  Laterality: N/A;  . LEFT HEART CATH AND CORS/GRAFTS ANGIOGRAPHY N/A 12/10/2019   Procedure: LEFT HEART CATH AND CORS/GRAFTS ANGIOGRAPHY;  Surgeon: Burnell Blanks, MD;  Location: [place] [facility];  Service: Cardiovascular;  Laterality: N/A;       Family History  Problem Relation Age of Onset  . Heart disease Other   . Heart attack Mother   . Colon cancer Neg Hx     Social History   Tobacco Use  . Smoking status: Former Smoker    Packs/day: 2.00    Years: 40.00    Pack years: 80.00    Types: Cigarettes    Quit date: 12/27/1994    Years since quitting: 25.7  . Smokeless tobacco: Never Used  Vaping Use  . Vaping Use: Never used  Substance Use Topics  . Alcohol use: No    Alcohol/week: 0.0 standard drinks  . Drug use: No    Home Medications Prior to Admission medications   Medication Sig Start Date End Date Taking? Authorizing Provider  albuterol (PROVENTIL) (2.5 MG/3ML) 0.083% nebulizer solution Take 3 mLs (2.5 mg total) by nebulization every 6 (six) hours as needed for wheezing or shortness of breath. Patient will  also need the nebulizer machine with this prescription. 10/22/18  Yes Nat Christen, MD  albuterol (VENTOLIN HFA) 108 (90 Base) MCG/ACT inhaler Inhale 2 puffs into the lungs every 6 (six) hours as needed for wheezing or shortness of breath.   Yes [provider]  aspirin EC 81 MG tablet Take 81 mg by mouth daily.   Yes [provider]  Bromfenac Sodium (PROLENSA) 0.07 % SOLN Place 1 drop into the right eye 4 (four) times daily. 08/28/20  Yes Bernarda Caffey, MD  busPIRone (BUSPAR) 10 MG tablet Take 10 mg by mouth 2 (two) times daily.  11/03/18  Yes [provider]  clopidogrel (PLAVIX) 75 MG tablet TAKE 1 TABLET BY MOUTH DAILY. 09/03/20  Yes Satira Sark, MD  dutasteride (AVODART) 0.5 MG capsule Take 0.5 mg by mouth. In the afternoon   Yes [provider]  escitalopram (LEXAPRO) 20 MG tablet Take 20 mg by mouth every morning.    Yes [provider]  Flax OIL Take 1,200 mg  by mouth daily.   Yes [provider]  hydrOXYzine (ATARAX/VISTARIL) 25 MG tablet Take 25 mg by mouth 2 (two) times daily as needed for anxiety.  11/19/19  Yes [provider]  isosorbide mononitrate (IMDUR) 30 MG 24 hr tablet Take 1 tablet (30 mg total) by mouth daily. 03/17/20  Yes Satira Sark, MD  levothyroxine (SYNTHROID, LEVOTHROID) 50 MCG tablet Take 50 mcg by mouth every morning.  10/04/16  Yes [provider]  memantine (NAMENDA) 10 MG tablet Take 10 mg by mouth 2 (two) times daily.  03/05/16  Yes [provider]  MOVANTIK 25 MG TABS tablet Take 25 mg by mouth daily. 08/30/20  Yes [provider]  nitroGLYCERIN (NITROSTAT) 0.4 MG SL tablet Place 1 tablet (0.4 mg total) under the tongue every 5 (five) minutes as needed for chest pain. PLACE ONE TABLET UNDER TONGUE EVERY 5 MIN UP TO 3 DOSES AS NEEDED FORCHEST PAIN. Patient taking differently: Place 0.4 mg under the tongue every 5 (five) minutes as needed for chest pain. Pt has bought nitro  bottle from home and oxycodone 30 mg ( 3 or 4 tabs) is in nitro bottle per wife Tory Septer. 12/17/19  Yes Barrett, Evelene Croon, PA-C  Omega-3 Fatty Acids (FISH OIL) 1200 MG CAPS Take 1,200 capsules by mouth daily.   Yes [provider]  oxycodone (ROXICODONE) 30 MG immediate release tablet Take 30 mg by mouth every 4 (four) hours as needed for pain. Take 1 tablet by mouth every 4 to 6 hours as needed for pain.  Max 8 /24 hours. 12/09/16  Yes [provider]  potassium chloride SA (K-DUR,KLOR-CON) 20 MEQ tablet Take 20 mEq by mouth daily.    Yes [provider]  prednisoLONE acetate (PRED FORTE) 1 % ophthalmic suspension Place 1 drop into the right eye 4 (four) times daily. 08/28/20  Yes Bernarda Caffey, MD  QUEtiapine (SEROQUEL) 25 MG tablet Take 25 mg by mouth at bedtime. 11/19/19  Yes [provider]  rosuvastatin (CRESTOR) 40 MG tablet Take 1 tablet (40 mg total) by mouth daily at 6 PM. 12/11/19 09/21/20 Yes Hollice Gong, Mir Mohammed, MD  tamsulosin (FLOMAX) 0.4 MG CAPS capsule Take 1 capsule (0.4 mg total) by mouth daily. To help you pee. 12/08/19  Yes Enzo Bi, MD  pantoprazole (PROTONIX) 40 MG tablet Take 1 tablet (40 mg total) by mouth every morning. Patient not taking: Reported on 09/21/2020 02/16/20   Barton Dubois, MD  zinc sulfate 220 (50 Zn) MG capsule Take 1 capsule (220 mg total) by mouth daily. Patient not taking: Reported on 09/21/2020 02/17/20   Barton Dubois, MD    Allergies    Patient has no known allergies.  Review of Systems   Review of Systems  Constitutional: Negative for appetite change and fatigue.  HENT: Negative for congestion, ear discharge and sinus pressure.   Eyes: Negative for discharge.  Respiratory: Negative for cough.   Cardiovascular: Positive for chest pain.  Gastrointestinal: Negative for abdominal pain and diarrhea.  Genitourinary: Negative for frequency and hematuria.  Musculoskeletal: Negative for back pain.  Skin:  Negative for rash.  Neurological: Negative for seizures and headaches.  Psychiatric/Behavioral: Negative for hallucinations.    Physical Exam Updated Vital Signs BP (!) 143/56   Pulse (!) 56   Resp 19   SpO2 94%   Physical Exam Vitals and nursing note reviewed.  Constitutional:      Appearance: He is well-developed.  HENT:     Head:  Normocephalic.     Nose: Nose normal.  Eyes:     General: No scleral icterus.    Conjunctiva/sclera: Conjunctivae normal.  Neck:     Thyroid: No thyromegaly.  Cardiovascular:     Rate and Rhythm: Normal rate and regular rhythm.     Heart sounds: No murmur heard.  No friction rub. No gallop.   Pulmonary:     Breath sounds: No stridor. No wheezing or rales.  Chest:     Chest wall: No tenderness.  Abdominal:     General: There is no distension.     Tenderness: There is no abdominal tenderness. There is no rebound.  Musculoskeletal:        General: Normal range of motion.     Cervical back: Neck supple.  Lymphadenopathy:     Cervical: No cervical adenopathy.  Skin:    Findings: No erythema or rash.  Neurological:     Mental Status: He is alert and oriented to person, place, and time.     Motor: No abnormal muscle tone.     Coordination: Coordination normal.  Psychiatric:        Behavior: Behavior normal.     ED Results / Procedures / Treatments   Labs (all labs ordered are listed, but only abnormal results are displayed) Labs Reviewed  CBC WITH DIFFERENTIAL/PLATELET - Abnormal; Notable for the following components:      Result Value   WBC 10.7 (*)    Neutro Abs 8.1 (*)    All other components within normal limits  D-DIMER, QUANTITATIVE (NOT AT [facility]) - Abnormal; Notable for the following components:   D-Dimer, Quant 7.57 (*)    All other components within normal limits  RESPIRATORY PANEL BY RT PCR (FLU A&B, COVID)  COMPREHENSIVE METABOLIC PANEL  BRAIN NATRIURETIC PEPTIDE  URINALYSIS, ROUTINE W REFLEX MICROSCOPIC  TROPONIN I  (HIGH SENSITIVITY)  TROPONIN I (HIGH SENSITIVITY)    EKG EKG Interpretation  Date/Time:  Sunday September 21 2020 18:26:16 EDT Ventricular Rate:  61 PR Interval:  172 QRS Duration: 132 QT Interval:  470 QTC Calculation: 473 R Axis:   28 Text Interpretation: Normal sinus rhythm Non-specific intra-ventricular conduction block Cannot rule out Septal infarct , age undetermined Abnormal ECG Confirmed by Milton Ferguson [phone]) on 09/21/2020 7:16:53 PM Also confirmed by Milton Ferguson [id number])  on 09/21/2020 7:27:34 PM   Radiology CT Head Wo Contrast  Result Date: 09/21/2020 CLINICAL DATA:  Patient status post fall.  Headache. EXAM: CT HEAD WITHOUT CONTRAST CT CERVICAL SPINE WITHOUT CONTRAST TECHNIQUE: Multidetector CT imaging of the head and cervical spine was performed following the standard protocol without intravenous contrast. Multiplanar CT image reconstructions of the cervical spine were also generated. COMPARISON:  Brain CT 02/10/2020 FINDINGS: CT HEAD FINDINGS Brain: Ventricles and sulci are appropriate for patient's age. No evidence for acute cortically based infarct, intracranial hemorrhage, mass lesion or mass-effect. Vascular: Unremarkable Skull: Intact. Sinuses/Orbits: Paranasal sinuses well aerated. Mastoid air cells unremarkable. Other: None CT CERVICAL SPINE FINDINGS Alignment: Normal. Skull base and vertebrae: No acute fracture. No primary bone lesion or focal pathologic process. Soft tissues and spinal canal: No prevertebral fluid or swelling. No visible canal hematoma. Disc levels: No acute fracture. Preservation the vertebral body heights. Upper chest: Negative. Other: Bilateral carotid arterial calcification. IMPRESSION: 1. No acute intracranial process. 2. No acute cervical spine fracture. Electronically Signed   By: Lovey Newcomer M.D.   On: 09/21/2020 20:42   CT Angio Chest PE W and/or Wo Contrast  Result Date: 09/21/2020 CLINICAL DATA:  Suspected pulmonary embolus with high  probability. Chest pressure, headache, and fall. EXAM: CT ANGIOGRAPHY CHEST WITH CONTRAST TECHNIQUE: Multidetector CT imaging of the chest was performed using the standard protocol during bolus administration of intravenous contrast. Multiplanar CT image reconstructions and MIPs were obtained to evaluate the vascular anatomy. CONTRAST:  166mL OMNIPAQUE IOHEXOL 350 MG/ML SOLN COMPARISON:  02/13/2020 FINDINGS: Cardiovascular: There is good opacification of the central and segmental pulmonary arteries. No focal filling defects. No evidence of significant pulmonary embolus. Normal heart size. No pericardial effusions. Normal caliber thoracic aorta. Aortic and coronary artery calcification. Postoperative changes in the mediastinum with coronary bypass. Mediastinum/Nodes: Esophagus is decompressed. No significant lymphadenopathy in the chest. Lungs/Pleura: Lungs are clear. No pleural effusions. No pneumothorax. Mediastinal contours appear intact. Upper Abdomen: No acute process demonstrated in the visualized upper abdomen. Musculoskeletal: Degenerative changes in the spine. Sternotomy wires. No destructive bone lesions. Review of the MIP images confirms the above findings. IMPRESSION: 1. No evidence of significant pulmonary embolus. 2. No evidence of active pulmonary disease. 3. Aortic atherosclerosis. Aortic Atherosclerosis (ICD10-I70.0). Electronically Signed   By: Lucienne Capers M.D.   On: 09/21/2020 22:25   CT Cervical Spine Wo Contrast  Result Date: 09/21/2020 CLINICAL DATA:  Patient status post fall.  Headache. EXAM: CT HEAD WITHOUT CONTRAST CT CERVICAL SPINE WITHOUT CONTRAST TECHNIQUE: Multidetector CT imaging of the head and cervical spine was performed following the standard protocol without intravenous contrast. Multiplanar CT image reconstructions of the cervical spine were also generated. COMPARISON:  Brain CT 02/10/2020 FINDINGS: CT HEAD FINDINGS Brain: Ventricles and sulci are appropriate for patient's  age. No evidence for acute cortically based infarct, intracranial hemorrhage, mass lesion or mass-effect. Vascular: Unremarkable Skull: Intact. Sinuses/Orbits: Paranasal sinuses well aerated. Mastoid air cells unremarkable. Other: None CT CERVICAL SPINE FINDINGS Alignment: Normal. Skull base and vertebrae: No acute fracture. No primary bone lesion or focal pathologic process. Soft tissues and spinal canal: No prevertebral fluid or swelling. No visible canal hematoma. Disc levels: No acute fracture. Preservation the vertebral body heights. Upper chest: Negative. Other: Bilateral carotid arterial calcification. IMPRESSION: 1. No acute intracranial process. 2. No acute cervical spine fracture. Electronically Signed   By: Lovey Newcomer M.D.   On: 09/21/2020 20:42   DG Chest Port 1 View  Result Date: 09/21/2020 CLINICAL DATA:  Patient status post fall. EXAM: PORTABLE CHEST 1 VIEW COMPARISON:  Chest radiograph 06/12/2020. FINDINGS: Monitoring leads overlie the patient. Stable cardiac and mediastinal contours status post median sternotomy and CABG procedure. Bibasilar heterogeneous opacities. No large area pulmonary consolidation. No pleural effusion or pneumothorax. Thoracic spine degenerative changes. IMPRESSION: Bibasilar opacities favored to represent atelectasis. Electronically Signed   By: Lovey Newcomer M.D.   On: 09/21/2020 20:34    Procedures Procedures (including critical care time)  Medications Ordered in ED Medications  iohexol (OMNIPAQUE) 350 MG/ML injection 100 mL (100 mLs Intravenous Contrast Given 09/21/20 2200)    ED Course  I have reviewed the triage vital signs and the nursing notes.  Pertinent labs & imaging results that were available during my care of the patient were reviewed by me and considered in my medical decision making (see chart for details).    MDM Rules/Calculators/A&P                          Patient with dizzy episode and fall.  Labs unremarkable and CT chest negative  patient will follow up with PCP  This patient presents to the ED for concern of dizziness chest pain, this involves an extensive number of treatment options, and is a complaint that carries with it a high risk of complications and morbidity.  The differential diagnosis includes CVA MI   Lab Tests:   I Ordered, reviewed, and interpreted labs, which included CBC and chemistries which showed mild elevated white count with normal troponin chemistries  Medicines ordered:   I ordered medication normal saline for dehydration  Imaging Studies ordered:   I ordered imaging studies which included CT chest  I independently visualized and interpreted imaging which showed no PE  Additional history obtained:   Additional history obtained from records  Previous records obtained and reviewed.  Consultations Obtained: Reevaluation:  After the interventions stated above, I reevaluated the patient and found unchanged  Critical Interventions:  .   Final Clinical Impression(s) / ED Diagnoses Final diagnoses:  Fall, initial encounter    Rx / DC Orders ED Discharge Orders    None       Milton Ferguson, MD 09/23/20 1035

## 2020-09-21 NOTE — ED Triage Notes (Signed)
Pt states he was in his bathroom, fell back hitting his head. Pt also c/o of chest pressure

## 2020-09-21 NOTE — Discharge Instructions (Addendum)
Rest at home next couple days and follow-up with your doctor this week for recheck

## 2020-09-25 DIAGNOSIS — G309 Alzheimer's disease, unspecified: Secondary | ICD-10-CM | POA: Diagnosis not present

## 2020-09-25 DIAGNOSIS — I10 Essential (primary) hypertension: Secondary | ICD-10-CM | POA: Diagnosis not present

## 2020-09-25 DIAGNOSIS — F0280 Dementia in other diseases classified elsewhere without behavioral disturbance: Secondary | ICD-10-CM | POA: Diagnosis not present

## 2020-09-25 DIAGNOSIS — J449 Chronic obstructive pulmonary disease, unspecified: Secondary | ICD-10-CM | POA: Diagnosis not present

## 2020-09-30 ENCOUNTER — Encounter (INDEPENDENT_AMBULATORY_CARE_PROVIDER_SITE_OTHER): Payer: PPO | Admitting: Ophthalmology

## 2020-10-02 DIAGNOSIS — I10 Essential (primary) hypertension: Secondary | ICD-10-CM | POA: Diagnosis not present

## 2020-10-02 DIAGNOSIS — I200 Unstable angina: Secondary | ICD-10-CM | POA: Diagnosis not present

## 2020-10-02 DIAGNOSIS — G894 Chronic pain syndrome: Secondary | ICD-10-CM | POA: Diagnosis not present

## 2020-10-02 DIAGNOSIS — Z6830 Body mass index (BMI) 30.0-30.9, adult: Secondary | ICD-10-CM | POA: Diagnosis not present

## 2020-10-02 DIAGNOSIS — M1991 Primary osteoarthritis, unspecified site: Secondary | ICD-10-CM | POA: Diagnosis not present

## 2020-10-02 DIAGNOSIS — Z23 Encounter for immunization: Secondary | ICD-10-CM | POA: Diagnosis not present

## 2020-10-23 NOTE — Progress Notes (Signed)
[facility] Note  10/27/2020     CHIEF COMPLAINT Patient presents for Retina Follow Up   HISTORY OF PRESENT ILLNESS: Dustin [name] is a 86 y.o. male who presents to the clinic today for:   HPI    Retina Follow Up    Patient presents with  Other.  In right eye.  This started 9 weeks ago.  I, the attending physician,  performed the HPI with the patient and updated documentation appropriately.          Comments    Patient here for 9 weeks retina follow up for CME OD. Patient states vision cant see. It varies, some days better that other days. No eye pain.        Last edited by Bernarda Caffey, MD on 10/27/2020  3:29 PM. (History)    pt states he does not use PF and Prolensa every day bc his wife won't put them in for him every day, he states his vision is blurry   HISTORICAL INFORMATION:   Selected notes from the MEDICAL RECORD NUMBER Pt of Dr. Zigmund Daniel LEE: 06.29.20 BCVA: OD 20/20, OS 20/30 Ocular Hx- CME OD, EMR OD, HTN Ret. OU, Choroidal Nevus OS, NS OS, ARMD OS, Pseudo OD    CURRENT MEDICATIONS: Current Outpatient Medications (Ophthalmic Drugs)  Medication Sig  . Bromfenac Sodium (PROLENSA) 0.07 % SOLN Place 1 drop into the right eye 4 (four) times daily.  . prednisoLONE acetate (PRED FORTE) 1 % ophthalmic suspension Place 1 drop into the right eye 4 (four) times daily.   No current facility-administered medications for this visit. (Ophthalmic Drugs)   Current Outpatient Medications (Other)  Medication Sig  . albuterol (PROVENTIL) (2.5 MG/3ML) 0.083% nebulizer solution Take 3 mLs (2.5 mg total) by nebulization every 6 (six) hours as needed for wheezing or shortness of breath. Patient will also need the nebulizer machine with this prescription.  [name] albuterol (VENTOLIN HFA) 108 (90 Base) MCG/ACT inhaler Inhale 2 puffs into the lungs every 6 (six) hours as needed for wheezing or shortness of breath.  . alfuzosin (UROXATRAL) 10 MG 24 hr tablet  Take 1 tablet (10 mg total) by mouth daily with breakfast.  . aspirin EC 81 MG tablet Take 81 mg by mouth daily.  . busPIRone (BUSPAR) 10 MG tablet Take 10 mg by mouth 2 (two) times daily.  (Patient not taking: Reported on 10/24/2020)  . clopidogrel (PLAVIX) 75 MG tablet TAKE 1 TABLET BY MOUTH DAILY.  [name] doxazosin (CARDURA) 1 MG tablet Take 1 mg by mouth at bedtime.  . dutasteride (AVODART) 0.5 MG capsule Take 0.5 mg by mouth. In the afternoon  . escitalopram (LEXAPRO) 20 MG tablet Take 20 mg by mouth every morning.   . Flax OIL Take 1,200 mg by mouth daily. (Patient not taking: Reported on 10/24/2020)  . hydrOXYzine (ATARAX/VISTARIL) 25 MG tablet Take 25 mg by mouth 2 (two) times daily as needed for anxiety.  (Patient not taking: Reported on 10/24/2020)  . isosorbide mononitrate (IMDUR) 30 MG 24 hr tablet Take 1 tablet (30 mg total) by mouth daily.  [name] levothyroxine (SYNTHROID, LEVOTHROID) 50 MCG tablet Take 50 mcg by mouth every morning.  (Patient not taking: Reported on 10/24/2020)  . memantine (NAMENDA) 10 MG tablet Take 10 mg by mouth 2 (two) times daily.   [name] MOVANTIK 25 MG TABS tablet Take 25 mg by mouth daily. (Patient not taking: Reported on 10/24/2020)  . nitroGLYCERIN (NITROSTAT) 0.4 MG SL  tablet Place 1 tablet (0.4 mg total) under the tongue every 5 (five) minutes as needed for chest pain. PLACE ONE TABLET UNDER TONGUE EVERY 5 MIN UP TO 3 DOSES AS NEEDED FORCHEST PAIN. (Patient taking differently: Place 0.4 mg under the tongue every 5 (five) minutes as needed for chest pain. Pt has bought nitro bottle from home and oxycodone 30 mg ( 3 or 4 tabs) is in nitro bottle per wife Charlyn Minerva.)  . nitroGLYCERIN (NITROSTAT) 0.6 MG SL tablet SMARTSIG:1 Tablet(s) Sublingual  . Omega-3 Fatty Acids (FISH OIL) 1200 MG CAPS Take 1,200 capsules by mouth daily.  [name] oxycodone (ROXICODONE) 30 MG immediate release tablet Take 30 mg by mouth every 4 (four) hours as needed for pain. Take 1 tablet by mouth every  4 to 6 hours as needed for pain.  Max 8 /24 hours.  . pantoprazole (PROTONIX) 40 MG tablet Take 1 tablet (40 mg total) by mouth every morning. (Patient not taking: Reported on 10/24/2020)  . potassium chloride SA (K-DUR,KLOR-CON) 20 MEQ tablet Take 20 mEq by mouth daily.   . QUEtiapine (SEROQUEL) 25 MG tablet Take 25 mg by mouth at bedtime. (Patient not taking: Reported on 10/24/2020)  . rosuvastatin (CRESTOR) 40 MG tablet Take 1 tablet (40 mg total) by mouth daily at 6 PM.  . tamsulosin (FLOMAX) 0.4 MG CAPS capsule Take 1 capsule (0.4 mg total) by mouth daily. To help you pee.  . zinc sulfate 220 (50 Zn) MG capsule Take 1 capsule (220 mg total) by mouth daily.   No current facility-administered medications for this visit. (Other)      REVIEW OF SYSTEMS: ROS    Positive for: Eyes   Last edited by Theodore Demark, COA on 10/27/2020  2:21 PM. (History)       ALLERGIES No Known Allergies  PAST MEDICAL HISTORY Past Medical History:  Diagnosis Date  . Alzheimer disease ([place])   . Anxiety   . Arthritis   . Atrophic gastritis    a. By EGD 02/2013.  . Carotid artery disease ([place])    a. mild-mod plaque, <50% stenosis bilat by duplex 2018.  [name] Coronary atherosclerosis of native coronary artery    a. Multivessel s/p CABG 1996. b. s/p DES x 2 SVG to PDA 8/12 with distal disease managed medically.  . DDD (degenerative disc disease)    Chronic back pain  . Enlarged prostate   . Essential hypertension   . Hematuria   . Hypothyroidism   . LBBB (left bundle branch block)   . MI (myocardial infarction) ([place])   . Mixed hyperlipidemia   . OA (osteoarthritis)   . OSA (obstructive sleep apnea)   . Pneumonia due to COVID-19 virus    February 2021  . Sinus bradycardia    a. Aricept and BB discontinued due to this.   Past Surgical History:  Procedure Laterality Date  . COLONOSCOPY  08/03/2004   Jenkins-numerous large diverticula in the descending, transverse, descending, and sigmoid colon.  Otherwise normal exam.  . COLONOSCOPY  07/12/2012   RMR: External hemorrhoidal tag; multiple rectal and colonic polyps removed and/or treated as described above. Pancolonic diverticulosis. Bx-tubular adenomas, rectal hyperplastic polyp. next colonoscopy in 06/2015.  [name] COLONOSCOPY N/A 06/22/2016   Procedure: COLONOSCOPY;  Surgeon: Aviva Signs, MD;  [facility];  Service: Gastroenterology;  Laterality: N/A;  730  . CORONARY ANGIOPLASTY WITH STENT PLACEMENT    . CORONARY ARTERY BYPASS GRAFT  1996   LIMA to LAD, SVG to  D2, SVG to PDA, SVG to OM1 and OM2  . CORONARY STENT INTERVENTION N/A 12/10/2019   Procedure: CORONARY STENT INTERVENTION;  Surgeon: Burnell Blanks, MD;  Location: [facility];  Service: Cardiovascular;  Laterality: N/A;  . ESOPHAGOGASTRODUODENOSCOPY N/A 03/16/2013   Procedure: ESOPHAGOGASTRODUODENOSCOPY (EGD);  Surgeon: Daneil Dolin, MD;  [facility]: [facility];  Service: Endoscopy;  Laterality: N/A;  12:00-moved to [place] notified pt  . ESOPHAGOGASTRODUODENOSCOPY N/A 03/06/2013   Procedure: ESOPHAGOGASTRODUODENOSCOPY (EGD);  Surgeon: Daneil Dolin, MD;  [facility]: [facility];  Service: Endoscopy;  Laterality: N/A;  . HERNIA REPAIR    . INTRAVASCULAR ULTRASOUND/IVUS N/A 12/10/2019   Procedure: Intravascular Ultrasound/IVUS;  Surgeon: Burnell Blanks, MD;  Location: [facility];  Service: Cardiovascular;  Laterality: N/A;  . LEFT HEART CATH AND CORS/GRAFTS ANGIOGRAPHY N/A 12/10/2019   Procedure: LEFT HEART CATH AND CORS/GRAFTS ANGIOGRAPHY;  Surgeon: Burnell Blanks, MD;  Location: [facility];  Service: Cardiovascular;  Laterality: N/A;    FAMILY HISTORY Family History  Problem Relation Age of Onset  . Heart disease Other   . Heart attack Mother   . Colon cancer Neg Hx     SOCIAL HISTORY Social History   Tobacco Use  . Smoking status: Former Smoker    Packs/day: 2.00    Years: 40.00    Pack years: 80.00     Types: Cigarettes    Quit date: 12/27/1994    Years since quitting: 25.8  . Smokeless tobacco: Never Used  Vaping Use  . Vaping Use: Never used  Substance Use Topics  . Alcohol use: No    Alcohol/week: 0.0 standard drinks  . Drug use: No         OPHTHALMIC EXAM:  Base Eye Exam    Visual Acuity (Snellen - Linear)      Right Left   Dist Delta 20/25 -2 20/40 +2   Dist ph [place] NI 20/30       Tonometry (Tonopen, 2:18 PM)      Right Left   Pressure 13 13       Pupils      Dark Light Shape React APD   Right 3 2 Round Brisk None   Left 3 2 Round Brisk None       Visual Fields (Counting fingers)      Left Right    Full Full       Extraocular Movement      Right Left    Full, Ortho Full, Ortho       Neuro/Psych    Oriented x3: Yes   Mood/Affect: Normal       Dilation    Both eyes: 1.0% Mydriacyl, 2.5% Phenylephrine @ 2:18 PM        Slit Lamp and Fundus Exam    Slit Lamp Exam      Right Left   Lids/Lashes Dermatochalasis - upper lid, Meibomian gland dysfunction Dermatochalasis - upper lid, Meibomian gland dysfunction   Conjunctiva/Sclera White and quiet White and quiet   Cornea arcus, trace Punctate epithelial erosions arcus, trace Punctate epithelial erosions   Anterior Chamber Deep and quiet Deep and quiet   Iris Round and dilated Round and moderately dilated to 5.20m   Lens PC IOL in good position 2-3+ Nuclear sclerosis with brunescence, 2+ Cortical cataract   Vitreous Vitreous syneresis Vitreous syneresis, Posterior vitreous detachment       Fundus Exam      Right Left  Disc Mild Pallor, Sharp rim Pink and Sharp   C/D Ratio 0.6 0.5   Macula Flat, Blunted foveal reflex, +ERM, interval improvement in trace Cystic changes, RPE mottling Flat, Blunted foveal reflex, RPE mottling, clumping and early atrophy   Vessels Vascular attenuation, mild tortuousity, AV crossing changes Vascular attenuation, mild tortuousity   Periphery Attached, mild reticular  degeneration, No heme  Attached, amelonotic choroidal nevus along distal IT arcades, reticular degeneration, No heme           IMAGING AND PROCEDURES  Imaging and Procedures for 10/27/2020  OCT, Retina - OU - Both Eyes       Right Eye Quality was good. Central Foveal Thickness: 316. Progression has improved. Findings include epiretinal membrane, intraretinal fluid, no SRF, normal foveal contour, macular pucker (Interval improvmenent in IRF/cystic changes ).   Left Eye Quality was good. Central Foveal Thickness: 284. Progression has been stable. Findings include intraretinal hyper-reflective material, abnormal foveal contour, no IRF, no SRF, epiretinal membrane, retinal drusen  (Focal ellipsoid disruption with IRHM IN fovea -- slightly improved).   Notes *Images captured and stored on drive  Diagnosis / Impression:  OD: ERM; Interval improvmenent in cystic changes OS: nonexudative ARMD w/ focal ellipsoid disruption and IRHM inf nasal fovea; no IRF/SRF  Clinical management:  See below  Abbreviations: NFP - Normal foveal profile. CME - cystoid macular edema. PED - pigment epithelial detachment. IRF - intraretinal fluid. SRF - subretinal fluid. EZ - ellipsoid zone. ERM - epiretinal membrane. ORA - outer retinal atrophy. ORT - outer retinal tubulation. SRHM - subretinal hyper-reflective material. IRHM - intraretinal hyper-reflective material                 ASSESSMENT/PLAN:    ICD-10-CM   1. Cystoid macular edema of right eye  H35.351   2. Retinal edema  H35.81 OCT, Retina - OU - Both Eyes  3. Epiretinal membrane (ERM) of right eye  H35.371   4. Intermediate stage nonexudative age-related macular degeneration of left eye  H35.3122   5. Nevus of choroid of left eye  D31.32   6. Essential hypertension  I10   7. Hypertensive retinopathy of both eyes  H35.033   8. Pseudophakia of right eye  Z96.1   9. Combined forms of age-related cataract of left eye  H25.812     1,2. CME  OD -- improving  - Zigmund Daniel pt, lost to f/u since June 2020  - hx of RBB kenalog 08.15.18  - was on Prolensa QHS OD for maintenance, but has been out of drops for months  - restarted Prolensa and PF QID OD 8.2.21  - OCT shows interval improvement in IRF/cystic changes  - BCVA stable at 20/25  - cont Prolensa and PF QID OD  - f/u 4 weeks, DFE, OCT  3. Epiretinal membrane, right eye  - mild ERM - BCVA 20/25 - asymptomatic, no metamorphopsia - no indication for surgery at this time - monitor for now  4. Age related macular degeneration, non-exudative, left eye  - The incidence, anatomy, and pathology of dry AMD, risk of progression, and the AREDS and AREDS 2 study including smoking risks discussed with patient.  - recommend Amsler grid monitoring  5. Choroidal nevus OS  - amelanotic nevus along distal ST arcades  - no SRF or orange pigment  - no elevation  - monitor  6,7. Hypertensive retinopathy OU - discussed importance of tight BP control - monitor  8. Pseudophakia OD  - s/p CE/IOL OD (  [place])  - IOL in good position, doing well  - monitor  9. Mixed Cataract OS - The symptoms of cataract, surgical options, and treatments and risks were discussed with patient. - discussed diagnosis and progression  - approaching visual significance  - under the expert management of [place] Ordered this visit:  Meds ordered this encounter  Medications  . prednisoLONE acetate (PRED FORTE) 1 % ophthalmic suspension    Sig: Place 1 drop into the right eye 4 (four) times daily.    Dispense:  15 mL    Refill:  0       Return in about 4 weeks (around 11/24/2020) for f/u CME OD, DFE, OCT.  There are no Patient Instructions on file for this visit.  This document serves as a record of services personally performed by Gardiner Sleeper, MD, PhD. It was created on their behalf by Leeann Must, [place], an ophthalmic technician. The creation of this record is the  provider's dictation and/or activities during the visit.    Electronically signed by: Leeann Must, [place] 10.28.2021 3:35 PM   This document serves as a record of services personally performed by Gardiner Sleeper, MD, PhD. It was created on their behalf by San Jetty. Owens Shark, OA an ophthalmic technician. The creation of this record is the provider's dictation and/or activities during the visit.    Electronically signed by: San Jetty. Owens Shark, [place] 11.01.2021 3:35 PM  Gardiner Sleeper, M.D., Ph.D. Diseases & Surgery of the Retina and [place] 10/27/2020   I have reviewed the above documentation for accuracy and completeness, and I agree with the above. Gardiner Sleeper, M.D., Ph.D. 10/27/20 3:35 PM   Abbreviations: M myopia (nearsighted); A astigmatism; H hyperopia (farsighted); P presbyopia; Mrx spectacle prescription;  CTL contact lenses; OD right eye; OS left eye; OU both eyes  XT exotropia; ET esotropia; PEK punctate epithelial keratitis; PEE punctate epithelial erosions; DES dry eye syndrome; MGD meibomian gland dysfunction; ATs artificial tears; PFAT's preservative free artificial tears; [place] nuclear sclerotic cataract; PSC posterior subcapsular cataract; ERM epi-retinal membrane; PVD posterior vitreous detachment; RD retinal detachment; DM diabetes mellitus; DR diabetic retinopathy; NPDR non-proliferative diabetic retinopathy; PDR proliferative diabetic retinopathy; CSME clinically significant macular edema; DME diabetic macular edema; dbh dot blot hemorrhages; CWS cotton wool spot; POAG primary open angle glaucoma; C/D cup-to-disc ratio; HVF humphrey visual field; GVF goldmann visual field; OCT optical coherence tomography; IOP intraocular pressure; BRVO Branch retinal vein occlusion; CRVO central retinal vein occlusion; CRAO central retinal artery occlusion; BRAO branch retinal artery occlusion; RT retinal tear; SB scleral buckle; PPV pars plana vitrectomy; VH Vitreous  hemorrhage; PRP panretinal laser photocoagulation; IVK intravitreal kenalog; VMT vitreomacular traction; MH Macular hole;  NVD neovascularization of the disc; NVE neovascularization elsewhere; AREDS age related eye disease study; ARMD age related macular degeneration; POAG primary open angle glaucoma; EBMD epithelial/anterior basement membrane dystrophy; ACIOL anterior chamber intraocular lens; IOL intraocular lens; PCIOL posterior chamber intraocular lens; Phaco/IOL phacoemulsification with intraocular lens placement; [place] photorefractive keratectomy; LASIK laser assisted in situ keratomileusis; HTN hypertension; DM diabetes mellitus; COPD chronic obstructive pulmonary disease

## 2020-10-24 ENCOUNTER — Ambulatory Visit (INDEPENDENT_AMBULATORY_CARE_PROVIDER_SITE_OTHER): Payer: PPO | Admitting: Urology

## 2020-10-24 ENCOUNTER — Other Ambulatory Visit: Payer: Self-pay

## 2020-10-24 ENCOUNTER — Encounter: Payer: Self-pay | Admitting: Urology

## 2020-10-24 VITALS — BP 96/60 | HR 67 | Temp 98.9°F | Ht 70.0 in | Wt 206.0 lb

## 2020-10-24 DIAGNOSIS — N401 Enlarged prostate with lower urinary tract symptoms: Secondary | ICD-10-CM

## 2020-10-24 DIAGNOSIS — R300 Dysuria: Secondary | ICD-10-CM | POA: Insufficient documentation

## 2020-10-24 DIAGNOSIS — N138 Other obstructive and reflux uropathy: Secondary | ICD-10-CM

## 2020-10-24 LAB — URINALYSIS, ROUTINE W REFLEX MICROSCOPIC
Bilirubin, UA: NEGATIVE
Glucose, UA: NEGATIVE
Ketones, UA: NEGATIVE
Leukocytes,UA: NEGATIVE
Nitrite, UA: NEGATIVE
Specific Gravity, UA: 1.02 (ref 1.005–1.030)
Urobilinogen, Ur: 1 mg/dL (ref 0.2–1.0)
pH, UA: 5.5 (ref 5.0–7.5)

## 2020-10-24 LAB — MICROSCOPIC EXAMINATION
Bacteria, UA: NONE SEEN
Epithelial Cells (non renal): NONE SEEN /hpf (ref 0–10)

## 2020-10-24 LAB — BLADDER SCAN AMB NON-IMAGING: Scan Result: 33

## 2020-10-24 MED ORDER — ALFUZOSIN HCL ER 10 MG PO TB24: 10.0000 mg | ORAL_TABLET | Freq: Every day | ORAL | 11 refills | Status: DC

## 2020-10-24 NOTE — Progress Notes (Signed)
10/24/2020 11:31 AM   Dustin [name] [birth year] [id number]  Referring provider: [name], MD [address] [place],  [place] [address]  dysuria  HPI: Dustin [name] is a 86yo here for evaluation of BPH and dysuria. For for the past 3 years he has noted worsening urgency, frequency, nocturia and occasional dysuria. He is currently on flomax and avodart. He was previously seen by Dr. Serita Butcher at [facility] but has not seen urology in 20-25 years. Stream is strong but he has starting/stopping, hesitancy and straining to urinate. NO gross hematuria. UA today shows no RBCS. PVR 33cc. Nocturia 3x. No urge incontinence. He is unhappy with his urination   PMH: Past Medical History:  Diagnosis Date  . Alzheimer disease ([place])   . Anxiety   . Arthritis   . Atrophic gastritis    a. By EGD 02/2013.  . Carotid artery disease ([place])    a. mild-mod plaque, <50% stenosis bilat by duplex 2018.  [name] Coronary atherosclerosis of native coronary artery    a. Multivessel s/p CABG 1996. b. s/p DES x 2 SVG to PDA 8/12 with distal disease managed medically.  . DDD (degenerative disc disease)    Chronic back pain  . Enlarged prostate   . Essential hypertension   . Hematuria   . Hypothyroidism   . LBBB (left bundle branch block)   . MI (myocardial infarction) ([place])   . Mixed hyperlipidemia   . OA (osteoarthritis)   . OSA (obstructive sleep apnea)   . Pneumonia due to COVID-19 virus    February 2021  . Sinus bradycardia    a. Aricept and BB discontinued due to this.    Surgical History: Past Surgical History:  Procedure Laterality Date  . COLONOSCOPY  08/03/2004   Jenkins-numerous large diverticula in the descending, transverse, descending, and sigmoid colon. Otherwise normal exam.  . COLONOSCOPY  07/12/2012   RMR: External hemorrhoidal tag; multiple rectal and colonic polyps removed and/or treated as described above. Pancolonic diverticulosis. Bx-tubular adenomas, rectal hyperplastic  polyp. next colonoscopy in 06/2015.  [name] COLONOSCOPY N/A 06/22/2016   Procedure: COLONOSCOPY;  Surgeon: Aviva Signs, MD;  [facility];  Service: Gastroenterology;  Laterality: N/A;  730  . CORONARY ANGIOPLASTY WITH STENT PLACEMENT    . CORONARY ARTERY BYPASS GRAFT  1996   LIMA to LAD, SVG to D2, SVG to PDA, SVG to OM1 and OM2  . CORONARY STENT INTERVENTION N/A 12/10/2019   Procedure: CORONARY STENT INTERVENTION;  Surgeon: Burnell Blanks, MD;  Location: [facility];  Service: Cardiovascular;  Laterality: N/A;  . ESOPHAGOGASTRODUODENOSCOPY N/A 03/16/2013   Procedure: ESOPHAGOGASTRODUODENOSCOPY (EGD);  Surgeon: Daneil Dolin, MD;  [facility];  Service: Endoscopy;  Laterality: N/A;  12:00-moved to [place] notified pt  . ESOPHAGOGASTRODUODENOSCOPY N/A 03/06/2013   Procedure: ESOPHAGOGASTRODUODENOSCOPY (EGD);  Surgeon: Daneil Dolin, MD;  [facility];  Service: Endoscopy;  Laterality: N/A;  . HERNIA REPAIR    . INTRAVASCULAR ULTRASOUND/IVUS N/A 12/10/2019   Procedure: Intravascular Ultrasound/IVUS;  Surgeon: Burnell Blanks, MD;  Location: [facility];  Service: Cardiovascular;  Laterality: N/A;  . LEFT HEART CATH AND CORS/GRAFTS ANGIOGRAPHY N/A 12/10/2019   Procedure: LEFT HEART CATH AND CORS/GRAFTS ANGIOGRAPHY;  Surgeon: Burnell Blanks, MD;  Location: [facility];  Service: Cardiovascular;  Laterality: N/A;    Home Medications:  Allergies as of 10/24/2020   No Known Allergies     Medication List  Accurate as of October 24, 2020 11:31 AM. If you have any questions, ask your nurse or doctor.        albuterol 108 (90 Base) MCG/ACT inhaler Commonly known as: VENTOLIN HFA Inhale 2 puffs into the lungs every 6 (six) hours as needed for wheezing or shortness of breath.   albuterol (2.5 MG/3ML) 0.083% nebulizer solution Commonly known as: PROVENTIL Take 3 mLs (2.5 mg total) by nebulization every 6  (six) hours as needed for wheezing or shortness of breath. Patient will also need the nebulizer machine with this prescription.   aspirin EC 81 MG tablet Take 81 mg by mouth daily.   Avodart 0.5 MG capsule Generic drug: dutasteride Take 0.5 mg by mouth. In the afternoon   busPIRone 10 MG tablet Commonly known as: BUSPAR Take 10 mg by mouth 2 (two) times daily.   clopidogrel 75 MG tablet Commonly known as: PLAVIX TAKE 1 TABLET BY MOUTH DAILY.   doxazosin 1 MG tablet Commonly known as: CARDURA Take 1 mg by mouth at bedtime.   escitalopram 20 MG tablet Commonly known as: LEXAPRO Take 20 mg by mouth every morning.   Fish Oil 1200 MG Caps Take 1,200 capsules by mouth daily.   Flax Oil Take 1,200 mg by mouth daily.   hydrOXYzine 25 MG tablet Commonly known as: ATARAX/VISTARIL Take 25 mg by mouth 2 (two) times daily as needed for anxiety.   isosorbide mononitrate 30 MG 24 hr tablet Commonly known as: IMDUR Take 1 tablet (30 mg total) by mouth daily.   levothyroxine 50 MCG tablet Commonly known as: SYNTHROID Take 50 mcg by mouth every morning.   memantine 10 MG tablet Commonly known as: NAMENDA Take 10 mg by mouth 2 (two) times daily.   Movantik 25 MG Tabs tablet Generic drug: naloxegol oxalate Take 25 mg by mouth daily.   nitroGLYCERIN 0.4 MG SL tablet Commonly known as: NITROSTAT Place 1 tablet (0.4 mg total) under the tongue every 5 (five) minutes as needed for chest pain. PLACE ONE TABLET UNDER TONGUE EVERY 5 MIN UP TO 3 DOSES AS NEEDED FORCHEST PAIN. What changed: additional instructions   nitroGLYCERIN 0.6 MG SL tablet Commonly known as: NITROSTAT SMARTSIG:1 Tablet(s) Sublingual What changed: Another medication with the same name was changed. Make sure you understand how and when to take each.   oxycodone 30 MG immediate release tablet Commonly known as: ROXICODONE Take 30 mg by mouth every 4 (four) hours as needed for pain. Take 1 tablet by mouth every 4  to 6 hours as needed for pain.  Max 8 /24 hours.   pantoprazole 40 MG tablet Commonly known as: PROTONIX Take 1 tablet (40 mg total) by mouth every morning.   potassium chloride SA 20 MEQ tablet Commonly known as: KLOR-CON Take 20 mEq by mouth daily.   prednisoLONE acetate 1 % ophthalmic suspension Commonly known as: PRED FORTE Place 1 drop into the right eye 4 (four) times daily.   Prolensa 0.07 % Soln Generic drug: Bromfenac Sodium Place 1 drop into the right eye 4 (four) times daily.   QUEtiapine 25 MG tablet Commonly known as: SEROQUEL Take 25 mg by mouth at bedtime.   rosuvastatin 40 MG tablet Commonly known as: CRESTOR Take 1 tablet (40 mg total) by mouth daily at 6 PM.   tamsulosin 0.4 MG Caps capsule Commonly known as: FLOMAX Take 1 capsule (0.4 mg total) by mouth daily. To help you pee.   zinc sulfate 220 (50 Zn) MG capsule  Take 1 capsule (220 mg total) by mouth daily.       Allergies: No Known Allergies  Family History: Family History  Problem Relation Age of Onset  . Heart disease Other   . Heart attack Mother   . Colon cancer Neg Hx     Social History:  reports that he quit smoking about 25 years ago. His smoking use included cigarettes. He has a 80.00 pack-year smoking history. He has never used smokeless tobacco. He reports that he does not drink alcohol and does not use drugs.  ROS: All other review of systems were reviewed and are negative except what is noted above in HPI  Physical Exam: BP 96/60   Pulse 67   Temp 98.9 F (37.2 C)   Ht 5\' 10"  (1.778 m)   Wt 206 lb (93.4 kg)   BMI 29.56 kg/m   Constitutional:  Alert and oriented, No acute distress. HEENT: [place] AT, moist mucus membranes.  Trachea midline, no masses. Cardiovascular: No clubbing, cyanosis, or edema. Respiratory: Normal respiratory effort, no increased work of breathing. GI: Abdomen is soft, nontender, nondistended, no abdominal masses GU: No CVA tenderness. Circumcised  phallus. No masses/lesions on penis, testis, scrotum. Prostate 30g smooth no nodules no induration.  Lymph: No cervical or inguinal lymphadenopathy. Skin: No rashes, bruises or suspicious lesions. Neurologic: Grossly intact, no focal deficits, moving all 4 extremities. Psychiatric: Normal mood and affect.  Laboratory Data: Lab Results  Component Value Date   WBC 10.7 (H) 09/21/2020   HGB 13.8 09/21/2020   HCT 43.0 09/21/2020   MCV 94.9 09/21/2020   PLT 230 09/21/2020    Lab Results  Component Value Date   CREATININE 1.04 09/21/2020    Lab Results  Component Value Date   PSA 0.50 01/26/2013    No results found for: TESTOSTERONE  Lab Results  Component Value Date   HGBA1C 5.7 (H) 06/29/2014    Urinalysis    Component Value Date/Time   COLORURINE YELLOW 09/21/2020 1927   APPEARANCEUR CLEAR 09/21/2020 1927   LABSPEC 1.016 09/21/2020 1927   PHURINE 5.0 09/21/2020 1927   GLUCOSEU NEGATIVE 09/21/2020 1927   HGBUR NEGATIVE 09/21/2020 [place] NEGATIVE 09/21/2020 [place] NEGATIVE 09/21/2020 1927   PROTEINUR NEGATIVE 09/21/2020 1927   UROBILINOGEN 0.2 01/26/2015 2247   NITRITE NEGATIVE 09/21/2020 1927   LEUKOCYTESUR NEGATIVE 09/21/2020 1927    Lab Results  Component Value Date   BACTERIA NONE SEEN 06/12/2020    Pertinent Imaging:  Results for orders placed during the hospital encounter of 12/09/19  DG Abd 1 View  Narrative CLINICAL DATA:  Constipation.  EXAM: ABDOMEN - 1 VIEW  COMPARISON:  January 22, 2013  FINDINGS: The bowel gas pattern is normal. No radio-opaque calculi or other significant radiographic abnormality are seen.  IMPRESSION: Negative.   Electronically Signed By: Constance Holster M.D. On: 12/10/2019 00:53  No results found for this or any previous visit.  No results found for this or any previous visit.  No results found for this or any previous visit.  No results found for this or any previous  visit.  No results found for this or any previous visit.  No results found for this or any previous visit.  No results found for this or any previous visit.   Assessment & Plan:    1. Dysuria -likely related to straining to urinate - Urinalysis, Routine w reflex microscopic - BLADDER SCAN AMB NON-IMAGING  2. BPH with LUTS, urinary  hesitancy -We will trial uroxatral 10mg  qhs   No follow-ups on file.  Nicolette Bang, MD  [facility] [place]

## 2020-10-24 NOTE — Patient Instructions (Signed)

## 2020-10-24 NOTE — Progress Notes (Signed)
PVR=33  Urological Symptom Review  Patient is experiencing the following symptoms: Hard to postpone urination Burning/pain with urination Get up at night to urinate Leakage of urine Stream starts and stops Trouble starting stream Have to strain to urinate Blood in urine Weak stream   Review of Systems  Gastrointestinal (upper)  : Nausea  Gastrointestinal (lower) : Constipation  Constitutional : Night Sweats Fatigue  Skin: Itching  Eyes: Blurred vision  Ear/Nose/Throat : Negative for Ear/Nose/Throat symptoms  Hematologic/Lymphatic: Easy bruising  Cardiovascular : Chest pain  Respiratory : Shortness of breath  Endocrine: Excessive thirst  Musculoskeletal: Joint pain  Neurological: Dizziness  Psychologic: Depression Anxiety

## 2020-10-27 ENCOUNTER — Other Ambulatory Visit: Payer: Self-pay

## 2020-10-27 ENCOUNTER — Encounter (INDEPENDENT_AMBULATORY_CARE_PROVIDER_SITE_OTHER): Payer: Self-pay | Admitting: Ophthalmology

## 2020-10-27 ENCOUNTER — Ambulatory Visit (INDEPENDENT_AMBULATORY_CARE_PROVIDER_SITE_OTHER): Payer: PPO | Admitting: Ophthalmology

## 2020-10-27 DIAGNOSIS — H25812 Combined forms of age-related cataract, left eye: Secondary | ICD-10-CM | POA: Diagnosis not present

## 2020-10-27 DIAGNOSIS — Z961 Presence of intraocular lens: Secondary | ICD-10-CM

## 2020-10-27 DIAGNOSIS — H3581 Retinal edema: Secondary | ICD-10-CM

## 2020-10-27 DIAGNOSIS — H35351 Cystoid macular degeneration, right eye: Secondary | ICD-10-CM | POA: Diagnosis not present

## 2020-10-27 DIAGNOSIS — H353122 Nonexudative age-related macular degeneration, left eye, intermediate dry stage: Secondary | ICD-10-CM

## 2020-10-27 DIAGNOSIS — H35371 Puckering of macula, right eye: Secondary | ICD-10-CM | POA: Diagnosis not present

## 2020-10-27 DIAGNOSIS — I10 Essential (primary) hypertension: Secondary | ICD-10-CM | POA: Diagnosis not present

## 2020-10-27 DIAGNOSIS — D3132 Benign neoplasm of left choroid: Secondary | ICD-10-CM

## 2020-10-27 DIAGNOSIS — H35033 Hypertensive retinopathy, bilateral: Secondary | ICD-10-CM

## 2020-10-27 MED ORDER — PREDNISOLONE ACETATE 1 % OP SUSP: 1.0000 [drp] | Freq: Four times a day (QID) | OPHTHALMIC | 0 refills | Status: DC

## 2020-10-30 DIAGNOSIS — W19XXXA Unspecified fall, initial encounter: Secondary | ICD-10-CM | POA: Diagnosis not present

## 2020-10-30 DIAGNOSIS — S20229A Contusion of unspecified back wall of thorax, initial encounter: Secondary | ICD-10-CM | POA: Diagnosis not present

## 2020-11-04 ENCOUNTER — Other Ambulatory Visit: Payer: Self-pay | Admitting: Cardiology

## 2020-11-04 DIAGNOSIS — I10 Essential (primary) hypertension: Secondary | ICD-10-CM | POA: Diagnosis not present

## 2020-11-04 DIAGNOSIS — G894 Chronic pain syndrome: Secondary | ICD-10-CM | POA: Diagnosis not present

## 2020-11-04 DIAGNOSIS — I209 Angina pectoris, unspecified: Secondary | ICD-10-CM | POA: Diagnosis not present

## 2020-11-04 DIAGNOSIS — Z6830 Body mass index (BMI) 30.0-30.9, adult: Secondary | ICD-10-CM | POA: Diagnosis not present

## 2020-11-04 DIAGNOSIS — M1991 Primary osteoarthritis, unspecified site: Secondary | ICD-10-CM | POA: Diagnosis not present

## 2020-11-22 NOTE — Progress Notes (Signed)
[name] Note  11/26/2020     CHIEF COMPLAINT Patient presents for Retina Follow Up   HISTORY OF PRESENT ILLNESS: Dustin [name] is a 86 y.o. male who presents to the clinic today for:   HPI    Retina Follow Up    Patient presents with  Other.  In both eyes.  This started weeks ago.  Severity is moderate.  Duration of weeks.  Since onset it is stable.  I, the attending physician,  performed the HPI with the patient and updated documentation appropriately.          Comments    Pt states he feels like he is going blind.  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  Pt denies eye pain or discomfort and denies any new or worsening floaters or fol OU.       Last edited by Bernarda Caffey, MD on 11/27/2020 11:17 PM. (History)    pt states his vision is doing okay, but he has been having chest pain for the past week or 2, pt would like a letter sent to him stating what is vision is today, because is license is up for renewal   HISTORICAL INFORMATION:   Selected notes from the MEDICAL RECORD NUMBER Pt of Dr. Zigmund Daniel [name]: 06.29.20 BCVA: OD 20/20, OS 20/30 Ocular Hx- CME OD, EMR OD, HTN Ret. OU, Choroidal Nevus OS, NS OS, ARMD OS, Pseudo OD    CURRENT MEDICATIONS: Current Outpatient Medications (Ophthalmic Drugs)  Medication Sig  . Bromfenac Sodium (PROLENSA) 0.07 % SOLN Place 1 drop into the right eye 4 (four) times daily.  . prednisoLONE acetate (PRED FORTE) 1 % ophthalmic suspension Place 1 drop into the right eye 4 (four) times daily.   No current facility-administered medications for this visit. (Ophthalmic Drugs)   Current Outpatient Medications (Other)  Medication Sig  . albuterol (PROVENTIL) (2.5 MG/3ML) 0.083% nebulizer solution Take 3 mLs (2.5 mg total) by nebulization every 6 (six) hours as needed for wheezing or shortness of breath. Patient will also need the nebulizer machine with this prescription.  [name] albuterol (VENTOLIN HFA) 108 (90  Base) MCG/ACT inhaler Inhale 2 puffs into the lungs every 6 (six) hours as needed for wheezing or shortness of breath.  . alfuzosin (UROXATRAL) 10 MG 24 hr tablet Take 1 tablet (10 mg total) by mouth daily with breakfast.  . aspirin EC 81 MG tablet Take 81 mg by mouth daily.  . busPIRone (BUSPAR) 10 MG tablet Take 10 mg by mouth 2 (two) times daily.  (Patient not taking: Reported on 10/24/2020)  . clopidogrel (PLAVIX) 75 MG tablet TAKE 1 TABLET BY MOUTH DAILY.  [name] doxazosin (CARDURA) 1 MG tablet Take 1 mg by mouth at bedtime.  . dutasteride (AVODART) 0.5 MG capsule Take 0.5 mg by mouth. In the afternoon  . escitalopram (LEXAPRO) 20 MG tablet Take 20 mg by mouth every morning.   . Flax OIL Take 1,200 mg by mouth daily. (Patient not taking: Reported on 10/24/2020)  . hydrOXYzine (ATARAX/VISTARIL) 25 MG tablet Take 25 mg by mouth 2 (two) times daily as needed for anxiety.  (Patient not taking: Reported on 10/24/2020)  . isosorbide mononitrate (IMDUR) 30 MG 24 hr tablet Take 1 tablet (30 mg total) by mouth daily.  [name] levothyroxine (SYNTHROID, LEVOTHROID) 50 MCG tablet Take 50 mcg by mouth every morning.  (Patient not taking: Reported on 10/24/2020)  . memantine (NAMENDA) 10 MG tablet Take 10 mg by  mouth 2 (two) times daily.   [name] MOVANTIK 25 MG TABS tablet Take 25 mg by mouth daily. (Patient not taking: Reported on 10/24/2020)  . nitroGLYCERIN (NITROSTAT) 0.4 MG SL tablet Place 1 tablet (0.4 mg total) under the tongue every 5 (five) minutes as needed for chest pain. Pt has bought nitro bottle from home and oxycodone 30 mg ( 3 or 4 tabs) is in nitro bottle per wife [name].  . nitroGLYCERIN (NITROSTAT) 0.6 MG SL tablet SMARTSIG:1 Tablet(s) Sublingual  . Omega-3 Fatty Acids (FISH OIL) 1200 MG CAPS Take 1,200 capsules by mouth daily.  [name] oxycodone (ROXICODONE) 30 MG immediate release tablet Take 30 mg by mouth every 4 (four) hours as needed for pain. Take 1 tablet by mouth every 4 to 6 hours as needed  for pain.  Max 8 /24 hours.  . pantoprazole (PROTONIX) 40 MG tablet Take 1 tablet (40 mg total) by mouth every morning. (Patient not taking: Reported on 10/24/2020)  . potassium chloride SA (K-DUR,KLOR-CON) 20 MEQ tablet Take 20 mEq by mouth daily.   . QUEtiapine (SEROQUEL) 25 MG tablet Take 25 mg by mouth at bedtime. (Patient not taking: Reported on 10/24/2020)  . rosuvastatin (CRESTOR) 40 MG tablet Take 1 tablet (40 mg total) by mouth daily at 6 PM.  . tamsulosin (FLOMAX) 0.4 MG CAPS capsule Take 1 capsule (0.4 mg total) by mouth daily. To help you pee.  . zinc sulfate 220 (50 Zn) MG capsule Take 1 capsule (220 mg total) by mouth daily.   No current facility-administered medications for this visit. (Other)      REVIEW OF SYSTEMS: ROS    Positive for: Eyes   Negative for: Constitutional, Gastrointestinal, Neurological, Skin, Genitourinary, Musculoskeletal, HENT, Endocrine, Cardiovascular, Respiratory, Psychiatric, Allergic/Imm, Heme/Lymph   Last edited by Doneen Poisson on 11/26/2020  2:27 PM. (History)       ALLERGIES No Known Allergies  PAST MEDICAL HISTORY Past Medical History:  Diagnosis Date  . Alzheimer disease ([place])   . Anxiety   . Arthritis   . Atrophic gastritis    a. By EGD 02/2013.  . Carotid artery disease ([place])    a. mild-mod plaque, <50% stenosis bilat by duplex 2018.  [name] Coronary atherosclerosis of native coronary artery    a. Multivessel s/p CABG 1996. b. s/p DES x 2 SVG to PDA 8/12 with distal disease managed medically.  . DDD (degenerative disc disease)    Chronic back pain  . Enlarged prostate   . Essential hypertension   . Hematuria   . Hypothyroidism   . LBBB (left bundle branch block)   . MI (myocardial infarction) ([place])   . Mixed hyperlipidemia   . OA (osteoarthritis)   . OSA (obstructive sleep apnea)   . Pneumonia due to COVID-19 virus    February 2021  . Sinus bradycardia    a. Aricept and BB discontinued due to this.   Past Surgical  History:  Procedure Laterality Date  . COLONOSCOPY  08/03/2004   Jenkins-numerous large diverticula in the descending, transverse, descending, and sigmoid colon. Otherwise normal exam.  . COLONOSCOPY  07/12/2012   RMR: External hemorrhoidal tag; multiple rectal and colonic polyps removed and/or treated as described above. Pancolonic diverticulosis. Bx-tubular adenomas, rectal hyperplastic polyp. next colonoscopy in 06/2015.  [name] COLONOSCOPY N/A 06/22/2016   Procedure: COLONOSCOPY;  Surgeon: Aviva Signs, MD;  [facility];  Service: Gastroenterology;  Laterality: N/A;  730  . CORONARY ANGIOPLASTY WITH STENT PLACEMENT    .  CORONARY ARTERY BYPASS GRAFT  1996   LIMA to LAD, SVG to D2, SVG to PDA, SVG to OM1 and OM2  . CORONARY STENT INTERVENTION N/A 12/10/2019   Procedure: CORONARY STENT INTERVENTION;  Surgeon: Burnell Blanks, MD;  Location: [place] [facility];  Service: Cardiovascular;  Laterality: N/A;  . ESOPHAGOGASTRODUODENOSCOPY N/A 03/16/2013   Procedure: ESOPHAGOGASTRODUODENOSCOPY (EGD);  Surgeon: Daneil Dolin, MD;  Location: [facility];  Service: Endoscopy;  Laterality: N/A;  12:00-moved to [place] notified pt  . ESOPHAGOGASTRODUODENOSCOPY N/A 03/06/2013   Procedure: ESOPHAGOGASTRODUODENOSCOPY (EGD);  Surgeon: Daneil Dolin, MD;  [facility]: [facility];  Service: Endoscopy;  Laterality: N/A;  . HERNIA REPAIR    . INTRAVASCULAR ULTRASOUND/IVUS N/A 12/10/2019   Procedure: Intravascular Ultrasound/IVUS;  Surgeon: Burnell Blanks, MD;  Location: [place] [facility];  Service: Cardiovascular;  Laterality: N/A;  . LEFT HEART CATH AND CORS/GRAFTS ANGIOGRAPHY N/A 12/10/2019   Procedure: LEFT HEART CATH AND CORS/GRAFTS ANGIOGRAPHY;  Surgeon: Burnell Blanks, MD;  Location: [place] [facility];  Service: Cardiovascular;  Laterality: N/A;    FAMILY HISTORY Family History  Problem Relation Age of Onset  . Heart disease Other   . Heart attack Mother   .  Colon cancer Neg Hx     SOCIAL HISTORY Social History   Tobacco Use  . Smoking status: Former Smoker    Packs/day: 2.00    Years: 40.00    Pack years: 80.00    Types: Cigarettes    Quit date: 12/27/1994    Years since quitting: 25.9  . Smokeless tobacco: Never Used  Vaping Use  . Vaping Use: Never used  Substance Use Topics  . Alcohol use: No    Alcohol/week: 0.0 standard drinks  . Drug use: No         OPHTHALMIC EXAM:  Base Eye Exam    Visual Acuity (Snellen - Linear)      Right Left   Dist [place] 20/25 -2 20/40 -1   Dist ph [place] 20/25 +2 20/30 -2   Correction: Glasses       Tonometry (Tonopen, 2:40 PM)      Right Left   Pressure 14 16       Pupils      Dark Light Shape React APD   Right 3 2 Round Brisk 0   Left 3 2 Round Brisk 0       Visual Fields      Left Right    Full Full       Extraocular Movement      Right Left    Full Full       Neuro/Psych    Oriented x3: Yes   Mood/Affect: Normal       Dilation    Both eyes: 1.0% Mydriacyl, 2.5% Phenylephrine @ 2:40 PM        Slit Lamp and Fundus Exam    Slit Lamp Exam      Right Left   Lids/Lashes Dermatochalasis - upper lid, Meibomian gland dysfunction Dermatochalasis - upper lid, Meibomian gland dysfunction   Conjunctiva/Sclera White and quiet White and quiet   Cornea arcus, trace Punctate epithelial erosions arcus, 2-3+ Punctate epithelial erosions   Anterior Chamber Deep and quiet Deep and quiet   Iris Round and dilated Round and moderately dilated to 5.39m   Lens PC IOL in good position 3+ Nuclear sclerosis with brunescence, 3+ Cortical cataract   Vitreous Vitreous syneresis Vitreous syneresis, Posterior vitreous detachment  Fundus Exam      Right Left   Disc Mild Pallor, Sharp rim Pink and Sharp   C/D Ratio 0.6 0.5   Macula Flat, Blunted foveal reflex, +ERM, stable improvement in trace Cystic changes, RPE mottling Flat, Blunted foveal reflex, RPE mottling, clumping and early atrophy    Vessels Vascular attenuation, mild tortuousity, AV crossing changes Vascular attenuation, mild tortuousity   Periphery Attached, mild reticular degeneration, No heme  Attached, amelonotic choroidal nevus along distal IT arcades, reticular degeneration, No heme           IMAGING AND PROCEDURES  Imaging and Procedures for 11/26/2020  OCT, Retina - OU - Both Eyes       Right Eye Quality was good. Central Foveal Thickness: 313. Progression has improved. Findings include epiretinal membrane, no SRF, normal foveal contour, macular pucker, no IRF (interval improvement in IRF -- just trace cystic changes remaining; Persistent ERM).   Left Eye Quality was good. Central Foveal Thickness: 282. Progression has been stable. Findings include intraretinal hyper-reflective material, abnormal foveal contour, no IRF, no SRF, epiretinal membrane, retinal drusen  (Focal ellipsoid disruption with IRHM IN fovea -- persistent).   Notes *Images captured and stored on drive  Diagnosis / Impression:  OD: ERM; Interval improvmenent in cystic changes OS: nonexudative ARMD w/ Focal ellipsoid disruption with IRHM IN fovea -- persistent  Clinical management:  See below  Abbreviations: NFP - Normal foveal profile. CME - cystoid macular edema. PED - pigment epithelial detachment. IRF - intraretinal fluid. SRF - subretinal fluid. EZ - ellipsoid zone. ERM - epiretinal membrane. ORA - outer retinal atrophy. ORT - outer retinal tubulation. SRHM - subretinal hyper-reflective material. IRHM - intraretinal hyper-reflective material                 ASSESSMENT/PLAN:    ICD-10-CM   1. Cystoid macular edema of right eye  H35.351   2. Retinal edema  H35.81 OCT, Retina - OU - Both Eyes  3. Epiretinal membrane (ERM) of right eye  H35.371   4. Intermediate stage nonexudative age-related macular degeneration of left eye  H35.3122   5. Nevus of choroid of left eye  D31.32   6. Essential hypertension  I10   7.  Hypertensive retinopathy of both eyes  H35.033   8. Pseudophakia of right eye  Z96.1   9. Combined forms of age-related cataract of left eye  H25.812     1,2. CME OD -- improving  - Dustin Daniel pt, lost to f/u since June 2020  - hx of RBB kenalog 08.15.18  - was on Prolensa QHS OD for maintenance, but has been out of drops for months  - restarted Prolensa and PF QID OD 08.02.21   - OCT shows interval improvement in IRF/cystic changes  - BCVA stable at 20/25  - cont Prolensa and PF QID OD -- decrease to BID OD  - f/u 4 weeks, DFE, OCT  3. Epiretinal membrane, right eye  - mild ERM - BCVA 20/25 - asymptomatic, no metamorphopsia - no indication for surgery at this time - monitor for now  4. Age related macular degeneration, non-exudative, left eye  - The incidence, anatomy, and pathology of dry AMD, risk of progression, and the AREDS and AREDS 2 study including smoking risks discussed with patient.  - recommend [place] grid monitoring  5. Choroidal nevus OS  - amelanotic nevus along distal ST arcades  - no SRF or orange pigment  - no elevation  - monitor  6,7.  Hypertensive retinopathy OU - discussed importance of tight BP control - monitor  8. Pseudophakia OD  - s/p CE/IOL OD ([place])  - IOL in good position, doing well  - monitor  9. Mixed Cataract OS - The symptoms of cataract, surgical options, and treatments and risks were discussed with patient. - discussed diagnosis and progression  - approaching visual significance  - under the expert management of [place] Ordered this visit:  No orders of the defined types were placed in this encounter.      Return in about 4 weeks (around 12/24/2020) for f/u CME OD, DFE, OCT.  There are no Patient Instructions on file for this visit.  This document serves as a record of services personally performed by Gardiner Sleeper, MD, PhD. It was created on their behalf by Roselee Nova, COMT. The creation of  this record is the provider's dictation and/or activities during the visit.  Electronically signed by: Roselee Nova, COMT 11/27/20 11:20 PM   Gardiner Sleeper, M.D., Ph.D. Diseases & Surgery of the Retina and Vitreous [name]  I have reviewed the above documentation for accuracy and completeness, and I agree with the above. Gardiner Sleeper, M.D., Ph.D. 11/27/20 11:20 PM   Abbreviations: M myopia (nearsighted); A astigmatism; H hyperopia (farsighted); P presbyopia; Mrx spectacle prescription;  CTL contact lenses; OD right eye; OS left eye; OU both eyes  XT exotropia; ET esotropia; PEK punctate epithelial keratitis; PEE punctate epithelial erosions; DES dry eye syndrome; MGD meibomian gland dysfunction; ATs artificial tears; PFAT's preservative free artificial tears; [place] nuclear sclerotic cataract; PSC posterior subcapsular cataract; ERM epi-retinal membrane; PVD posterior vitreous detachment; RD retinal detachment; DM diabetes mellitus; DR diabetic retinopathy; NPDR non-proliferative diabetic retinopathy; PDR proliferative diabetic retinopathy; CSME clinically significant macular edema; DME diabetic macular edema; dbh dot blot hemorrhages; CWS cotton wool spot; POAG primary open angle glaucoma; C/D cup-to-disc ratio; HVF humphrey visual field; GVF goldmann visual field; OCT optical coherence tomography; IOP intraocular pressure; BRVO Branch retinal vein occlusion; CRVO central retinal vein occlusion; CRAO central retinal artery occlusion; BRAO branch retinal artery occlusion; RT retinal tear; SB scleral buckle; PPV pars plana vitrectomy; VH Vitreous hemorrhage; PRP panretinal laser photocoagulation; IVK intravitreal kenalog; VMT vitreomacular traction; MH Macular hole;  NVD neovascularization of the disc; NVE neovascularization elsewhere; AREDS age related eye disease study; ARMD age related macular degeneration; POAG primary open angle glaucoma; EBMD epithelial/anterior  basement membrane dystrophy; ACIOL anterior chamber intraocular lens; IOL intraocular lens; PCIOL posterior chamber intraocular lens; Phaco/IOL phacoemulsification with intraocular lens placement; [place] photorefractive keratectomy; LASIK laser assisted in situ keratomileusis; HTN hypertension; DM diabetes mellitus; COPD chronic obstructive pulmonary disease

## 2020-11-25 DIAGNOSIS — I10 Essential (primary) hypertension: Secondary | ICD-10-CM | POA: Diagnosis not present

## 2020-11-25 DIAGNOSIS — G309 Alzheimer's disease, unspecified: Secondary | ICD-10-CM | POA: Diagnosis not present

## 2020-11-25 DIAGNOSIS — F0280 Dementia in other diseases classified elsewhere without behavioral disturbance: Secondary | ICD-10-CM | POA: Diagnosis not present

## 2020-11-25 DIAGNOSIS — J449 Chronic obstructive pulmonary disease, unspecified: Secondary | ICD-10-CM | POA: Diagnosis not present

## 2020-11-26 ENCOUNTER — Other Ambulatory Visit: Payer: Self-pay

## 2020-11-26 ENCOUNTER — Ambulatory Visit (INDEPENDENT_AMBULATORY_CARE_PROVIDER_SITE_OTHER): Payer: PPO | Admitting: Ophthalmology

## 2020-11-26 DIAGNOSIS — I10 Essential (primary) hypertension: Secondary | ICD-10-CM | POA: Diagnosis not present

## 2020-11-26 DIAGNOSIS — H353122 Nonexudative age-related macular degeneration, left eye, intermediate dry stage: Secondary | ICD-10-CM

## 2020-11-26 DIAGNOSIS — H25812 Combined forms of age-related cataract, left eye: Secondary | ICD-10-CM

## 2020-11-26 DIAGNOSIS — H35033 Hypertensive retinopathy, bilateral: Secondary | ICD-10-CM

## 2020-11-26 DIAGNOSIS — H35371 Puckering of macula, right eye: Secondary | ICD-10-CM | POA: Diagnosis not present

## 2020-11-26 DIAGNOSIS — Z961 Presence of intraocular lens: Secondary | ICD-10-CM

## 2020-11-26 DIAGNOSIS — H35351 Cystoid macular degeneration, right eye: Secondary | ICD-10-CM | POA: Diagnosis not present

## 2020-11-26 DIAGNOSIS — D3132 Benign neoplasm of left choroid: Secondary | ICD-10-CM | POA: Diagnosis not present

## 2020-11-26 DIAGNOSIS — H3581 Retinal edema: Secondary | ICD-10-CM | POA: Diagnosis not present

## 2020-11-27 ENCOUNTER — Encounter (INDEPENDENT_AMBULATORY_CARE_PROVIDER_SITE_OTHER): Payer: Self-pay | Admitting: Ophthalmology

## 2020-12-05 ENCOUNTER — Ambulatory Visit: Payer: PPO | Admitting: Urology

## 2020-12-05 DIAGNOSIS — Z6831 Body mass index (BMI) 31.0-31.9, adult: Secondary | ICD-10-CM | POA: Diagnosis not present

## 2020-12-05 DIAGNOSIS — J449 Chronic obstructive pulmonary disease, unspecified: Secondary | ICD-10-CM | POA: Diagnosis not present

## 2020-12-05 DIAGNOSIS — M1991 Primary osteoarthritis, unspecified site: Secondary | ICD-10-CM | POA: Diagnosis not present

## 2020-12-05 DIAGNOSIS — G894 Chronic pain syndrome: Secondary | ICD-10-CM | POA: Diagnosis not present

## 2020-12-05 DIAGNOSIS — N138 Other obstructive and reflux uropathy: Secondary | ICD-10-CM

## 2020-12-05 DIAGNOSIS — I209 Angina pectoris, unspecified: Secondary | ICD-10-CM | POA: Diagnosis not present

## 2020-12-05 DIAGNOSIS — E063 Autoimmune thyroiditis: Secondary | ICD-10-CM | POA: Diagnosis not present

## 2020-12-24 ENCOUNTER — Encounter (INDEPENDENT_AMBULATORY_CARE_PROVIDER_SITE_OTHER): Payer: PPO | Admitting: Ophthalmology

## 2020-12-26 DIAGNOSIS — F0280 Dementia in other diseases classified elsewhere without behavioral disturbance: Secondary | ICD-10-CM | POA: Diagnosis not present

## 2020-12-26 DIAGNOSIS — G309 Alzheimer's disease, unspecified: Secondary | ICD-10-CM | POA: Diagnosis not present

## 2020-12-26 DIAGNOSIS — I10 Essential (primary) hypertension: Secondary | ICD-10-CM | POA: Diagnosis not present

## 2020-12-26 DIAGNOSIS — J449 Chronic obstructive pulmonary disease, unspecified: Secondary | ICD-10-CM | POA: Diagnosis not present

## 2021-01-05 DIAGNOSIS — I10 Essential (primary) hypertension: Secondary | ICD-10-CM | POA: Diagnosis not present

## 2021-01-05 DIAGNOSIS — M1991 Primary osteoarthritis, unspecified site: Secondary | ICD-10-CM | POA: Diagnosis not present

## 2021-01-05 DIAGNOSIS — J449 Chronic obstructive pulmonary disease, unspecified: Secondary | ICD-10-CM | POA: Diagnosis not present

## 2021-01-05 DIAGNOSIS — G894 Chronic pain syndrome: Secondary | ICD-10-CM | POA: Diagnosis not present

## 2021-01-05 DIAGNOSIS — Z6829 Body mass index (BMI) 29.0-29.9, adult: Secondary | ICD-10-CM | POA: Diagnosis not present

## 2021-01-15 ENCOUNTER — Ambulatory Visit: Payer: PPO | Admitting: Urology

## 2021-01-15 DIAGNOSIS — N138 Other obstructive and reflux uropathy: Secondary | ICD-10-CM

## 2021-01-28 ENCOUNTER — Ambulatory Visit: Payer: PPO | Admitting: Cardiology

## 2021-01-28 NOTE — Progress Notes (Deleted)
Cardiology Office Note  Date: 01/28/2021   ID: Dustin [name], DOB 1934-03-31, MRN [id number]  PCP:  [name], MD  Cardiologist:  Rozann Lesches, MD Electrophysiologist:  None   No chief complaint on file.   History of Present Illness: Dustin [name] is an 87 y.o. male last seen in July 2021.  Past Medical History:  Diagnosis Date  . Alzheimer disease ([place])   . Anxiety   . Arthritis   . Atrophic gastritis    a. By EGD 02/2013.  . Carotid artery disease ([place])    a. mild-mod plaque, <50% stenosis bilat by duplex 2018.  [name] Coronary atherosclerosis of native coronary artery    a. Multivessel s/p CABG 1996. b. s/p DES x 2 SVG to PDA 8/12 with distal disease managed medically.  . DDD (degenerative disc disease)    Chronic back pain  . Enlarged prostate   . Essential hypertension   . Hematuria   . Hypothyroidism   . LBBB (left bundle branch block)   . MI (myocardial infarction) ([place])   . Mixed hyperlipidemia   . OA (osteoarthritis)   . OSA (obstructive sleep apnea)   . Pneumonia due to COVID-19 virus    February 2021  . Sinus bradycardia    a. Aricept and BB discontinued due to this.    Past Surgical History:  Procedure Laterality Date  . COLONOSCOPY  08/03/2004   Jenkins-numerous large diverticula in the descending, transverse, descending, and sigmoid colon. Otherwise normal exam.  . COLONOSCOPY  07/12/2012   RMR: External hemorrhoidal tag; multiple rectal and colonic polyps removed and/or treated as described above. Pancolonic diverticulosis. Bx-tubular adenomas, rectal hyperplastic polyp. next colonoscopy in 06/2015.  [name] COLONOSCOPY N/A 06/22/2016   Procedure: COLONOSCOPY;  Surgeon: Aviva Signs, MD;  [facility];  Service: Gastroenterology;  Laterality: N/A;  730  . CORONARY ANGIOPLASTY WITH STENT PLACEMENT    . CORONARY ARTERY BYPASS GRAFT  1996   LIMA to LAD, SVG to D2, SVG to PDA, SVG to OM1 and OM2  . CORONARY STENT INTERVENTION N/A 12/10/2019    Procedure: CORONARY STENT INTERVENTION;  Surgeon: Burnell Blanks, MD;  Location: [facility];  Service: Cardiovascular;  Laterality: N/A;  . ESOPHAGOGASTRODUODENOSCOPY N/A 03/16/2013   Procedure: ESOPHAGOGASTRODUODENOSCOPY (EGD);  Surgeon: Daneil Dolin, MD;  Location: [facility];  Service: Endoscopy;  Laterality: N/A;  12:00-moved to [place] notified pt  . ESOPHAGOGASTRODUODENOSCOPY N/A 03/06/2013   Procedure: ESOPHAGOGASTRODUODENOSCOPY (EGD);  Surgeon: Daneil Dolin, MD;  [facility]: [facility];  Service: Endoscopy;  Laterality: N/A;  . HERNIA REPAIR    . INTRAVASCULAR ULTRASOUND/IVUS N/A 12/10/2019   Procedure: Intravascular Ultrasound/IVUS;  Surgeon: Burnell Blanks, MD;  Location: [facility];  Service: Cardiovascular;  Laterality: N/A;  . LEFT HEART CATH AND CORS/GRAFTS ANGIOGRAPHY N/A 12/10/2019   Procedure: LEFT HEART CATH AND CORS/GRAFTS ANGIOGRAPHY;  Surgeon: Burnell Blanks, MD;  Location: [facility];  Service: Cardiovascular;  Laterality: N/A;    Current Outpatient Medications  Medication Sig Dispense Refill  . albuterol (PROVENTIL) (2.5 MG/3ML) 0.083% nebulizer solution Take 3 mLs (2.5 mg total) by nebulization every 6 (six) hours as needed for wheezing or shortness of breath. Patient will also need the nebulizer machine with this prescription. 75 mL 3  . albuterol (VENTOLIN HFA) 108 (90 Base) MCG/ACT inhaler Inhale 2 puffs into the lungs every 6 (six) hours as needed for wheezing or shortness of breath.    [name]  alfuzosin (UROXATRAL) 10 MG 24 hr tablet Take 1 tablet (10 mg total) by mouth daily with breakfast. 30 tablet 11  . aspirin EC 81 MG tablet Take 81 mg by mouth daily.    . Bromfenac Sodium (PROLENSA) 0.07 % SOLN Place 1 drop into the right eye 4 (four) times daily. 3 mL 3  . busPIRone (BUSPAR) 10 MG tablet Take 10 mg by mouth 2 (two) times daily.  (Patient not taking: Reported on 10/24/2020)    . clopidogrel (PLAVIX)  75 MG tablet TAKE 1 TABLET BY MOUTH DAILY. 90 tablet 3  . doxazosin (CARDURA) 1 MG tablet Take 1 mg by mouth at bedtime.    . dutasteride (AVODART) 0.5 MG capsule Take 0.5 mg by mouth. In the afternoon    . escitalopram (LEXAPRO) 20 MG tablet Take 20 mg by mouth every morning.     . Flax OIL Take 1,200 mg by mouth daily. (Patient not taking: Reported on 10/24/2020)    . hydrOXYzine (ATARAX/VISTARIL) 25 MG tablet Take 25 mg by mouth 2 (two) times daily as needed for anxiety.  (Patient not taking: Reported on 10/24/2020)    . isosorbide mononitrate (IMDUR) 30 MG 24 hr tablet Take 1 tablet (30 mg total) by mouth daily. 90 tablet 1  . levothyroxine (SYNTHROID, LEVOTHROID) 50 MCG tablet Take 50 mcg by mouth every morning.  (Patient not taking: Reported on 10/24/2020)    . memantine (NAMENDA) 10 MG tablet Take 10 mg by mouth 2 (two) times daily.     [name] MOVANTIK 25 MG TABS tablet Take 25 mg by mouth daily. (Patient not taking: Reported on 10/24/2020)    . nitroGLYCERIN (NITROSTAT) 0.4 MG SL tablet Place 1 tablet (0.4 mg total) under the tongue every 5 (five) minutes as needed for chest pain. Pt has bought nitro bottle from home and oxycodone 30 mg ( 3 or 4 tabs) is in nitro bottle per wife Savalas Monje. 25 tablet 3  . nitroGLYCERIN (NITROSTAT) 0.6 MG SL tablet SMARTSIG:1 Tablet(s) Sublingual    . Omega-3 Fatty Acids (FISH OIL) 1200 MG CAPS Take 1,200 capsules by mouth daily.    [name] oxycodone (ROXICODONE) 30 MG immediate release tablet Take 30 mg by mouth every 4 (four) hours as needed for pain. Take 1 tablet by mouth every 4 to 6 hours as needed for pain.  Max 8 /24 hours.    . pantoprazole (PROTONIX) 40 MG tablet Take 1 tablet (40 mg total) by mouth every morning. (Patient not taking: Reported on 10/24/2020) 30 tablet 1  . potassium chloride SA (K-DUR,KLOR-CON) 20 MEQ tablet Take 20 mEq by mouth daily.     . prednisoLONE acetate (PRED FORTE) 1 % ophthalmic suspension Place 1 drop into the right eye 4 (four)  times daily. 15 mL 0  . QUEtiapine (SEROQUEL) 25 MG tablet Take 25 mg by mouth at bedtime. (Patient not taking: Reported on 10/24/2020)    . rosuvastatin (CRESTOR) 40 MG tablet Take 1 tablet (40 mg total) by mouth daily at 6 PM. 30 tablet 0  . tamsulosin (FLOMAX) 0.4 MG CAPS capsule Take 1 capsule (0.4 mg total) by mouth daily. To help you pee. 30 capsule 3  . zinc sulfate 220 (50 Zn) MG capsule Take 1 capsule (220 mg total) by mouth daily. 30 capsule 1   No current facility-administered medications for this visit.   Allergies:  Patient has no known allergies.   Social History: The patient  reports that he quit smoking about 75  years ago. His smoking use included cigarettes. He has a 80.00 pack-year smoking history. He has never used smokeless tobacco. He reports that he does not drink alcohol and does not use drugs.   Family History: The patient's family history includes Heart attack in his mother; Heart disease in an other family member.   ROS:  Please see the history of present illness. Otherwise, complete review of systems is positive for {NONE DEFAULTED:18576::"none"}.  All other systems are reviewed and negative.   Physical Exam: VS:  There were no vitals taken for this visit., BMI There is no height or weight on file to calculate BMI.  Wt Readings from Last 3 Encounters:  10/24/20 206 lb (93.4 kg)  07/25/20 (!) 206 lb (93.4 kg)  06/12/20 215 lb (97.5 kg)    General: Patient appears comfortable at rest. HEENT: Conjunctiva and lids normal, oropharynx clear with moist mucosa. Neck: Supple, no elevated JVP or carotid bruits, no thyromegaly. Lungs: Clear to auscultation, nonlabored breathing at rest. Cardiac: Regular rate and rhythm, no S3 or significant systolic murmur, no pericardial rub. Abdomen: Soft, nontender, no hepatomegaly, bowel sounds present, no guarding or rebound. Extremities: No pitting edema, distal pulses 2+. Skin: Warm and dry. Musculoskeletal: No  kyphosis. Neuropsychiatric: Alert and oriented x3, affect grossly appropriate.  ECG:  An ECG dated 09/21/2020 was personally reviewed today and demonstrated:  Sinus rhythm with IVCD and nonspecific T wave changes.  Recent Labwork: 02/16/2020: Magnesium 2.1 09/21/2020: ALT 22; AST 24; B Natriuretic Peptide 71.0; BUN 23; Creatinine, Ser 1.04; Hemoglobin 13.8; Platelets 230; Potassium 3.7; Sodium 136     Component Value Date/Time   CHOL 143 12/11/2019 0438   TRIG 83 02/13/2020 0229   HDL 37 (L) 12/11/2019 0438   CHOLHDL 3.9 12/11/2019 0438   VLDL 29 12/11/2019 0438   LDLCALC 77 12/11/2019 0438    Other Studies Reviewed Today:  Cardiac catheterization 12/10/2019:  Origin lesion is 100% stenosed.  Mid Graft to Dist Graft lesion is 100% stenosed.  Mid RCA lesion is 100% stenosed.  Prox Graft lesion is 100% stenosed.  Mid LAD lesion is 99% stenosed.  Prox Cx to Mid Cx lesion is 100% stenosed.  SVG graft was visualized by angiography.  SVG graft was visualized by angiography.  Origin to Prox Graft lesion is 100% stenosed.  SVG graft was visualized by angiography.  LIMA graft was visualized by angiography.  Ost LM to Mid LM lesion is 90% stenosed.  A drug-eluting stent was successfully placed using a STENT RESOLUTE ONYX 4.5X18.  Post intervention, there is a 0% residual stenosis.  1. Severe triple vessel CAD s/p 5V CABG with 2/5 patent bypass grafts.  2. The left main artery has a moderate ostial stenosis and a severe mid stenosis.  3. The LAD has severe proximal to mid stenosis beyond the first large caliber diagonal branch. The LIMA to the LAD is patent. The vein graft to the second diagonal branch is occluded. The second diagonal branch fills from flow through the LIMA to the LAD. The first diagonal branch is not protected by the LIMA graft.  4. The ramus intermediate branch is a moderate caliber vessel and is not protected by grafts.  5. The Circumflex is a moderate  caliber vessel with chronic mid occlusion. The sequential vein graft to OM1 and OM2 is patent to OM1 but the sequential segment of the graft to OM2 is occluded. OM2 fills from left to left collaterals.  6. The RCA is a large caliber  vessel with chronic mid occlusion. The vein graft to the PDA is now occluded. The distal RCA and it's branches fill from left to right collaterals supplied through the LIMA to the LAD.  7. Successful PTCA/DES x 1 ostial to to distal left main artery.   Recommendations: Continue ASA and Plavix for lifetime given the left main stenting. Continue statin and Imdur. Anticipate discharge home tomorrow.  Echocardiogram 12/10/2019: 1. Left ventricular ejection fraction, by visual estimation, is 55 to  60%. The left ventricle has normal function. There is no left ventricular  hypertrophy.  2. Left ventricular diastolic parameters are consistent with Grade I  diastolic dysfunction (impaired relaxation).  3. Severely dilated left ventricular internal cavity size.  4. The left ventricle has no regional wall motion abnormalities.  5. No change in LVEF from echo done in 2019.  6. Global right ventricle has normal systolic function.The right  ventricular size is normal. No increase in right ventricular wall  thickness.  7. Left atrial size was mildly dilated.  8. Right atrial size was mildly dilated.  9. The mitral valve is normal in structure. Mild mitral valve  regurgitation.  10. The tricuspid valve is grossly normal. Tricuspid valve regurgitation  is trivial.  11. The aortic valve is tricuspid. Aortic valve regurgitation is not  visualized. Mild aortic valve sclerosis without stenosis.  12. The pulmonic valve was grossly normal. Pulmonic valve regurgitation is  trivial.  13. Normal pulmonary artery systolic pressure.  14. The interatrial septum was not assessed.   Assessment and Plan:   Medication Adjustments/Labs and Tests Ordered: Current medicines  are reviewed at length with the patient today.  Concerns regarding medicines are outlined above.   Tests Ordered: No orders of the defined types were placed in this encounter.   Medication Changes: No orders of the defined types were placed in this encounter.   Disposition:  Follow up {follow up:15908}  Signed, Satira Sark, MD, [facility] 01/28/2021 10:49 AM    [facility] [place]. [address] [place], [place] [address] Phone: [phone]; Fax: [phone]

## 2021-02-03 DIAGNOSIS — G309 Alzheimer's disease, unspecified: Secondary | ICD-10-CM | POA: Diagnosis not present

## 2021-02-03 DIAGNOSIS — I2510 Atherosclerotic heart disease of native coronary artery without angina pectoris: Secondary | ICD-10-CM | POA: Diagnosis not present

## 2021-02-03 DIAGNOSIS — Z1389 Encounter for screening for other disorder: Secondary | ICD-10-CM | POA: Diagnosis not present

## 2021-02-03 DIAGNOSIS — Z6830 Body mass index (BMI) 30.0-30.9, adult: Secondary | ICD-10-CM | POA: Diagnosis not present

## 2021-02-03 DIAGNOSIS — E6609 Other obesity due to excess calories: Secondary | ICD-10-CM | POA: Diagnosis not present

## 2021-02-03 DIAGNOSIS — G894 Chronic pain syndrome: Secondary | ICD-10-CM | POA: Diagnosis not present

## 2021-02-03 DIAGNOSIS — R7309 Other abnormal glucose: Secondary | ICD-10-CM | POA: Diagnosis not present

## 2021-02-03 DIAGNOSIS — N401 Enlarged prostate with lower urinary tract symptoms: Secondary | ICD-10-CM | POA: Diagnosis not present

## 2021-02-03 DIAGNOSIS — E7849 Other hyperlipidemia: Secondary | ICD-10-CM | POA: Diagnosis not present

## 2021-02-03 DIAGNOSIS — Z0001 Encounter for general adult medical examination with abnormal findings: Secondary | ICD-10-CM | POA: Diagnosis not present

## 2021-02-03 DIAGNOSIS — I10 Essential (primary) hypertension: Secondary | ICD-10-CM | POA: Diagnosis not present

## 2021-02-03 DIAGNOSIS — F419 Anxiety disorder, unspecified: Secondary | ICD-10-CM | POA: Diagnosis not present

## 2021-02-03 DIAGNOSIS — F341 Dysthymic disorder: Secondary | ICD-10-CM | POA: Diagnosis not present

## 2021-02-03 DIAGNOSIS — J449 Chronic obstructive pulmonary disease, unspecified: Secondary | ICD-10-CM | POA: Diagnosis not present

## 2021-02-10 ENCOUNTER — Other Ambulatory Visit: Payer: Self-pay

## 2021-02-10 ENCOUNTER — Inpatient Hospital Stay (HOSPITAL_COMMUNITY)
Admission: EM | Admit: 2021-02-10 | Discharge: 2021-02-12 | DRG: 190 | Disposition: A | Discharge status: Home Health | Payer: PPO | Attending: Family Medicine | Admitting: Family Medicine

## 2021-02-10 ENCOUNTER — Emergency Department (HOSPITAL_COMMUNITY): Payer: PPO

## 2021-02-10 ENCOUNTER — Encounter (HOSPITAL_COMMUNITY): Payer: Self-pay | Admitting: *Deleted

## 2021-02-10 DIAGNOSIS — F1120 Opioid dependence, uncomplicated: Secondary | ICD-10-CM | POA: Diagnosis present

## 2021-02-10 DIAGNOSIS — J9601 Acute respiratory failure with hypoxia: Secondary | ICD-10-CM | POA: Diagnosis present

## 2021-02-10 DIAGNOSIS — Z20822 Contact with and (suspected) exposure to covid-19: Secondary | ICD-10-CM | POA: Diagnosis present

## 2021-02-10 DIAGNOSIS — T380X5A Adverse effect of glucocorticoids and synthetic analogues, initial encounter: Secondary | ICD-10-CM | POA: Diagnosis not present

## 2021-02-10 DIAGNOSIS — N179 Acute kidney failure, unspecified: Secondary | ICD-10-CM | POA: Diagnosis not present

## 2021-02-10 DIAGNOSIS — D649 Anemia, unspecified: Secondary | ICD-10-CM | POA: Diagnosis not present

## 2021-02-10 DIAGNOSIS — U071 COVID-19: Secondary | ICD-10-CM | POA: Diagnosis not present

## 2021-02-10 DIAGNOSIS — J9621 Acute and chronic respiratory failure with hypoxia: Secondary | ICD-10-CM | POA: Diagnosis present

## 2021-02-10 DIAGNOSIS — R531 Weakness: Secondary | ICD-10-CM | POA: Diagnosis not present

## 2021-02-10 DIAGNOSIS — R0902 Hypoxemia: Secondary | ICD-10-CM | POA: Diagnosis not present

## 2021-02-10 DIAGNOSIS — T17920A Food in respiratory tract, part unspecified causing asphyxiation, initial encounter: Secondary | ICD-10-CM | POA: Diagnosis not present

## 2021-02-10 DIAGNOSIS — Z955 Presence of coronary angioplasty implant and graft: Secondary | ICD-10-CM

## 2021-02-10 DIAGNOSIS — G9341 Metabolic encephalopathy: Secondary | ICD-10-CM | POA: Diagnosis present

## 2021-02-10 DIAGNOSIS — F32A Depression, unspecified: Secondary | ICD-10-CM | POA: Diagnosis present

## 2021-02-10 DIAGNOSIS — I2510 Atherosclerotic heart disease of native coronary artery without angina pectoris: Secondary | ICD-10-CM | POA: Diagnosis not present

## 2021-02-10 DIAGNOSIS — M1990 Unspecified osteoarthritis, unspecified site: Secondary | ICD-10-CM | POA: Diagnosis present

## 2021-02-10 DIAGNOSIS — Z8616 Personal history of COVID-19: Secondary | ICD-10-CM

## 2021-02-10 DIAGNOSIS — R069 Unspecified abnormalities of breathing: Secondary | ICD-10-CM | POA: Diagnosis not present

## 2021-02-10 DIAGNOSIS — N400 Enlarged prostate without lower urinary tract symptoms: Secondary | ICD-10-CM | POA: Diagnosis not present

## 2021-02-10 DIAGNOSIS — R739 Hyperglycemia, unspecified: Secondary | ICD-10-CM | POA: Diagnosis not present

## 2021-02-10 DIAGNOSIS — F419 Anxiety disorder, unspecified: Secondary | ICD-10-CM | POA: Diagnosis present

## 2021-02-10 DIAGNOSIS — I110 Hypertensive heart disease with heart failure: Secondary | ICD-10-CM | POA: Diagnosis not present

## 2021-02-10 DIAGNOSIS — G894 Chronic pain syndrome: Secondary | ICD-10-CM | POA: Diagnosis present

## 2021-02-10 DIAGNOSIS — I5032 Chronic diastolic (congestive) heart failure: Secondary | ICD-10-CM | POA: Diagnosis present

## 2021-02-10 DIAGNOSIS — Z7902 Long term (current) use of antithrombotics/antiplatelets: Secondary | ICD-10-CM

## 2021-02-10 DIAGNOSIS — G4733 Obstructive sleep apnea (adult) (pediatric): Secondary | ICD-10-CM | POA: Diagnosis present

## 2021-02-10 DIAGNOSIS — R7309 Other abnormal glucose: Secondary | ICD-10-CM | POA: Diagnosis not present

## 2021-02-10 DIAGNOSIS — J441 Chronic obstructive pulmonary disease with (acute) exacerbation: Principal | ICD-10-CM | POA: Diagnosis present

## 2021-02-10 DIAGNOSIS — J449 Chronic obstructive pulmonary disease, unspecified: Secondary | ICD-10-CM | POA: Diagnosis present

## 2021-02-10 DIAGNOSIS — J1282 Pneumonia due to coronavirus disease 2019: Secondary | ICD-10-CM | POA: Diagnosis not present

## 2021-02-10 DIAGNOSIS — R0602 Shortness of breath: Secondary | ICD-10-CM | POA: Diagnosis not present

## 2021-02-10 DIAGNOSIS — E039 Hypothyroidism, unspecified: Secondary | ICD-10-CM | POA: Diagnosis present

## 2021-02-10 DIAGNOSIS — R079 Chest pain, unspecified: Secondary | ICD-10-CM | POA: Diagnosis not present

## 2021-02-10 DIAGNOSIS — Z79890 Hormone replacement therapy (postmenopausal): Secondary | ICD-10-CM

## 2021-02-10 DIAGNOSIS — G309 Alzheimer's disease, unspecified: Secondary | ICD-10-CM | POA: Diagnosis present

## 2021-02-10 DIAGNOSIS — Z8249 Family history of ischemic heart disease and other diseases of the circulatory system: Secondary | ICD-10-CM

## 2021-02-10 DIAGNOSIS — Z79899 Other long term (current) drug therapy: Secondary | ICD-10-CM

## 2021-02-10 DIAGNOSIS — Z87891 Personal history of nicotine dependence: Secondary | ICD-10-CM

## 2021-02-10 DIAGNOSIS — F0390 Unspecified dementia without behavioral disturbance: Secondary | ICD-10-CM | POA: Diagnosis present

## 2021-02-10 DIAGNOSIS — K219 Gastro-esophageal reflux disease without esophagitis: Secondary | ICD-10-CM | POA: Diagnosis present

## 2021-02-10 DIAGNOSIS — K2940 Chronic atrophic gastritis without bleeding: Secondary | ICD-10-CM | POA: Diagnosis present

## 2021-02-10 DIAGNOSIS — Z951 Presence of aortocoronary bypass graft: Secondary | ICD-10-CM

## 2021-02-10 DIAGNOSIS — I10 Essential (primary) hypertension: Secondary | ICD-10-CM | POA: Diagnosis present

## 2021-02-10 DIAGNOSIS — I252 Old myocardial infarction: Secondary | ICD-10-CM

## 2021-02-10 DIAGNOSIS — F0280 Dementia in other diseases classified elsewhere without behavioral disturbance: Secondary | ICD-10-CM | POA: Diagnosis present

## 2021-02-10 DIAGNOSIS — Z7982 Long term (current) use of aspirin: Secondary | ICD-10-CM

## 2021-02-10 DIAGNOSIS — J439 Emphysema, unspecified: Secondary | ICD-10-CM | POA: Diagnosis not present

## 2021-02-10 LAB — CBC WITH DIFFERENTIAL/PLATELET
Abs Immature Granulocytes: 0.02 10*3/uL (ref 0.00–0.07)
Basophils Absolute: 0 10*3/uL (ref 0.0–0.1)
Basophils Relative: 0 %
Eosinophils Absolute: 0.1 10*3/uL (ref 0.0–0.5)
Eosinophils Relative: 1 %
HCT: 37.2 % — ABNORMAL LOW (ref 39.0–52.0)
Hemoglobin: 11.9 g/dL — ABNORMAL LOW (ref 13.0–17.0)
Immature Granulocytes: 0 %
Lymphocytes Relative: 14 %
Lymphs Abs: 1 10*3/uL (ref 0.7–4.0)
MCH: 30.9 pg (ref 26.0–34.0)
MCHC: 32 g/dL (ref 30.0–36.0)
MCV: 96.6 fL (ref 80.0–100.0)
Monocytes Absolute: 0.6 10*3/uL (ref 0.1–1.0)
Monocytes Relative: 9 %
Neutro Abs: 5.6 10*3/uL (ref 1.7–7.7)
Neutrophils Relative %: 76 %
Platelets: 165 10*3/uL (ref 150–400)
RBC: 3.85 MIL/uL — ABNORMAL LOW (ref 4.22–5.81)
RDW: 13.6 % (ref 11.5–15.5)
WBC: 7.3 10*3/uL (ref 4.0–10.5)
nRBC: 0 % (ref 0.0–0.2)

## 2021-02-10 LAB — BASIC METABOLIC PANEL
Anion gap: 8 (ref 5–15)
BUN: 26 mg/dL — ABNORMAL HIGH (ref 8–23)
CO2: 26 mmol/L (ref 22–32)
Calcium: 8.6 mg/dL — ABNORMAL LOW (ref 8.9–10.3)
Chloride: 99 mmol/L (ref 98–111)
Creatinine, Ser: 1.75 mg/dL — ABNORMAL HIGH (ref 0.61–1.24)
GFR, Estimated: 38 mL/min — ABNORMAL LOW (ref >60)
Glucose, Bld: 134 mg/dL — ABNORMAL HIGH (ref 70–99)
Potassium: 3.1 mmol/L — ABNORMAL LOW (ref 3.5–5.1)
Sodium: 133 mmol/L — ABNORMAL LOW (ref 135–145)

## 2021-02-10 LAB — URINALYSIS, ROUTINE W REFLEX MICROSCOPIC
Bilirubin Urine: NEGATIVE
Glucose, UA: NEGATIVE mg/dL
Ketones, ur: 5 mg/dL — AB
Leukocytes,Ua: NEGATIVE
Nitrite: NEGATIVE
Protein, ur: 100 mg/dL — AB
Specific Gravity, Urine: 1.013 (ref 1.005–1.030)
pH: 5 (ref 5.0–8.0)

## 2021-02-10 LAB — TROPONIN I (HIGH SENSITIVITY)
Troponin I (High Sensitivity): 28 ng/L — ABNORMAL HIGH (ref <18)
Troponin I (High Sensitivity): 30 ng/L — ABNORMAL HIGH (ref <18)

## 2021-02-10 LAB — RESP PANEL BY RT-PCR (FLU A&B, COVID) ARPGX2
Influenza A by PCR: NEGATIVE
Influenza B by PCR: NEGATIVE
SARS Coronavirus 2 by RT PCR: NEGATIVE

## 2021-02-10 LAB — BRAIN NATRIURETIC PEPTIDE: B Natriuretic Peptide: 153 pg/mL — ABNORMAL HIGH (ref 0.0–100.0)

## 2021-02-10 MED ORDER — LORAZEPAM 2 MG/ML IJ SOLN: 1.0000 mg | Freq: Three times a day (TID) | INTRAMUSCULAR | Status: DC | PRN

## 2021-02-10 MED ORDER — BUSPIRONE HCL 5 MG PO TABS
10.0000 mg | ORAL_TABLET | Freq: Two times a day (BID) | ORAL | Status: DC
  Administered 2021-02-10 – 2021-02-12 (×4): 10 mg via ORAL
  Filled 2021-02-10 (×4): qty 2

## 2021-02-10 MED ORDER — METHYLPREDNISOLONE SODIUM SUCC 125 MG IJ SOLR
125.0000 mg | Freq: Once | INTRAMUSCULAR | Status: AC
  Administered 2021-02-10: 125 mg via INTRAVENOUS
  Filled 2021-02-10: qty 2

## 2021-02-10 MED ORDER — POTASSIUM CHLORIDE CRYS ER 20 MEQ PO TBCR
20.0000 meq | EXTENDED_RELEASE_TABLET | Freq: Every day | ORAL | Status: DC
  Administered 2021-02-10 – 2021-02-12 (×3): 20 meq via ORAL
  Filled 2021-02-10 (×3): qty 1

## 2021-02-10 MED ORDER — MEMANTINE HCL 10 MG PO TABS
10.0000 mg | ORAL_TABLET | Freq: Two times a day (BID) | ORAL | Status: DC
  Administered 2021-02-10 – 2021-02-12 (×4): 10 mg via ORAL
  Filled 2021-02-10 (×4): qty 1

## 2021-02-10 MED ORDER — METHYLPREDNISOLONE SODIUM SUCC 40 MG IJ SOLR
40.0000 mg | Freq: Three times a day (TID) | INTRAMUSCULAR | Status: DC
  Administered 2021-02-10 – 2021-02-12 (×6): 40 mg via INTRAVENOUS
  Filled 2021-02-10 (×6): qty 1

## 2021-02-10 MED ORDER — ALBUTEROL SULFATE (2.5 MG/3ML) 0.083% IN NEBU: 2.5000 mg | INHALATION_SOLUTION | RESPIRATORY_TRACT | Status: DC | PRN

## 2021-02-10 MED ORDER — HYDROXYZINE HCL 25 MG PO TABS: 25.0000 mg | ORAL_TABLET | Freq: Two times a day (BID) | ORAL | Status: DC | PRN

## 2021-02-10 MED ORDER — ONDANSETRON HCL 4 MG PO TABS: 4.0000 mg | ORAL_TABLET | Freq: Four times a day (QID) | ORAL | Status: DC | PRN

## 2021-02-10 MED ORDER — ALFUZOSIN HCL ER 10 MG PO TB24: 10.0000 mg | ORAL_TABLET | Freq: Every day | ORAL | Status: DC

## 2021-02-10 MED ORDER — SODIUM CHLORIDE 0.9% FLUSH: 3.0000 mL | INTRAVENOUS | Status: DC | PRN

## 2021-02-10 MED ORDER — KETOROLAC TROMETHAMINE 0.5 % OP SOLN: 1.0000 [drp] | Freq: Four times a day (QID) | OPHTHALMIC | Status: DC

## 2021-02-10 MED ORDER — SODIUM CHLORIDE 0.9% FLUSH
3.0000 mL | Freq: Two times a day (BID) | INTRAVENOUS | Status: DC
  Administered 2021-02-11 – 2021-02-12 (×3): 3 mL via INTRAVENOUS

## 2021-02-10 MED ORDER — QUETIAPINE FUMARATE 25 MG PO TABS
25.0000 mg | ORAL_TABLET | Freq: Every day | ORAL | Status: DC
  Administered 2021-02-10 – 2021-02-11 (×2): 25 mg via ORAL
  Filled 2021-02-10 (×2): qty 1

## 2021-02-10 MED ORDER — IPRATROPIUM-ALBUTEROL 0.5-2.5 (3) MG/3ML IN SOLN
3.0000 mL | Freq: Four times a day (QID) | RESPIRATORY_TRACT | Status: DC
  Administered 2021-02-10 (×2): 3 mL via RESPIRATORY_TRACT
  Filled 2021-02-10 (×2): qty 3

## 2021-02-10 MED ORDER — ROSUVASTATIN CALCIUM 20 MG PO TABS
40.0000 mg | ORAL_TABLET | Freq: Every day | ORAL | Status: DC
  Administered 2021-02-10 – 2021-02-11 (×2): 40 mg via ORAL
  Filled 2021-02-10 (×2): qty 2

## 2021-02-10 MED ORDER — POLYETHYLENE GLYCOL 3350 17 G PO PACK: 17.0000 g | PACK | Freq: Every day | ORAL | Status: DC | PRN

## 2021-02-10 MED ORDER — ALBUTEROL SULFATE HFA 108 (90 BASE) MCG/ACT IN AERS: 2.0000 | INHALATION_SPRAY | Freq: Four times a day (QID) | RESPIRATORY_TRACT | Status: DC | PRN

## 2021-02-10 MED ORDER — OXYCODONE HCL 5 MG PO TABS
30.0000 mg | ORAL_TABLET | Freq: Four times a day (QID) | ORAL | Status: DC | PRN
  Administered 2021-02-11 (×2): 30 mg via ORAL
  Filled 2021-02-10 (×2): qty 6

## 2021-02-10 MED ORDER — ALBUTEROL SULFATE HFA 108 (90 BASE) MCG/ACT IN AERS
4.0000 | INHALATION_SPRAY | Freq: Once | RESPIRATORY_TRACT | Status: AC
  Administered 2021-02-10: 4 via RESPIRATORY_TRACT
  Filled 2021-02-10: qty 6.7

## 2021-02-10 MED ORDER — ZINC SULFATE 220 (50 ZN) MG PO CAPS
220.0000 mg | ORAL_CAPSULE | Freq: Every day | ORAL | Status: DC
  Administered 2021-02-10 – 2021-02-12 (×3): 220 mg via ORAL
  Filled 2021-02-10 (×3): qty 1

## 2021-02-10 MED ORDER — ACETAMINOPHEN 650 MG RE SUPP: 650.0000 mg | Freq: Four times a day (QID) | RECTAL | Status: DC | PRN

## 2021-02-10 MED ORDER — ALBUTEROL SULFATE (2.5 MG/3ML) 0.083% IN NEBU: 2.5000 mg | INHALATION_SOLUTION | Freq: Four times a day (QID) | RESPIRATORY_TRACT | Status: DC | PRN

## 2021-02-10 MED ORDER — LEVOTHYROXINE SODIUM 50 MCG PO TABS
50.0000 ug | ORAL_TABLET | Freq: Every morning | ORAL | Status: DC
  Administered 2021-02-11 – 2021-02-12 (×2): 50 ug via ORAL
  Filled 2021-02-10 (×2): qty 1

## 2021-02-10 MED ORDER — SODIUM CHLORIDE 0.9 % IV SOLN: 250.0000 mL | INTRAVENOUS | Status: DC | PRN

## 2021-02-10 MED ORDER — ONDANSETRON HCL 4 MG/2ML IJ SOLN
4.0000 mg | Freq: Four times a day (QID) | INTRAMUSCULAR | Status: DC | PRN
  Administered 2021-02-11: 4 mg via INTRAVENOUS
  Filled 2021-02-10: qty 2

## 2021-02-10 MED ORDER — ACETAMINOPHEN 325 MG PO TABS: 650.0000 mg | ORAL_TABLET | Freq: Four times a day (QID) | ORAL | Status: DC | PRN

## 2021-02-10 MED ORDER — DOXYCYCLINE HYCLATE 100 MG PO TABS
100.0000 mg | ORAL_TABLET | Freq: Two times a day (BID) | ORAL | Status: DC
  Administered 2021-02-10 – 2021-02-12 (×5): 100 mg via ORAL
  Filled 2021-02-10 (×5): qty 1

## 2021-02-10 MED ORDER — PANTOPRAZOLE SODIUM 40 MG PO TBEC: 40.0000 mg | DELAYED_RELEASE_TABLET | Freq: Every morning | ORAL | Status: DC

## 2021-02-10 MED ORDER — HEPARIN SODIUM (PORCINE) 5000 UNIT/ML IJ SOLN
5000.0000 [IU] | Freq: Three times a day (TID) | INTRAMUSCULAR | Status: DC
  Administered 2021-02-10 – 2021-02-12 (×5): 5000 [IU] via SUBCUTANEOUS
  Filled 2021-02-10 (×5): qty 1

## 2021-02-10 MED ORDER — VITAMIN D (ERGOCALCIFEROL) 1.25 MG (50000 UNIT) PO CAPS: 50000.0000 [IU] | ORAL_CAPSULE | ORAL | Status: DC

## 2021-02-10 MED ORDER — GUAIFENESIN ER 600 MG PO TB12
600.0000 mg | ORAL_TABLET | Freq: Two times a day (BID) | ORAL | Status: DC
  Administered 2021-02-10 – 2021-02-12 (×5): 600 mg via ORAL
  Filled 2021-02-10 (×5): qty 1

## 2021-02-10 MED ORDER — SODIUM CHLORIDE 0.9 % IV SOLN: INTRAVENOUS | Status: DC

## 2021-02-10 MED ORDER — ISOSORBIDE MONONITRATE ER 60 MG PO TB24: 30.0000 mg | ORAL_TABLET | Freq: Every day | ORAL | Status: DC

## 2021-02-10 MED ORDER — TRAZODONE HCL 50 MG PO TABS: 50.0000 mg | ORAL_TABLET | Freq: Every evening | ORAL | Status: DC | PRN

## 2021-02-10 MED ORDER — GUAIFENESIN-DM 100-10 MG/5ML PO SYRP: 10.0000 mL | ORAL_SOLUTION | ORAL | Status: DC | PRN

## 2021-02-10 MED ORDER — SODIUM CHLORIDE 0.9% FLUSH
3.0000 mL | Freq: Two times a day (BID) | INTRAVENOUS | Status: DC
  Administered 2021-02-11 – 2021-02-12 (×2): 3 mL via INTRAVENOUS

## 2021-02-10 MED ORDER — DOXAZOSIN MESYLATE 2 MG PO TABS
1.0000 mg | ORAL_TABLET | Freq: Every day | ORAL | Status: DC
  Administered 2021-02-10 – 2021-02-11 (×2): 1 mg via ORAL
  Filled 2021-02-10 (×2): qty 1

## 2021-02-10 MED ORDER — ISOSORBIDE MONONITRATE ER 60 MG PO TB24
60.0000 mg | ORAL_TABLET | Freq: Every morning | ORAL | Status: DC
  Administered 2021-02-10 – 2021-02-12 (×3): 60 mg via ORAL
  Filled 2021-02-10 (×3): qty 1

## 2021-02-10 MED ORDER — ASPIRIN EC 81 MG PO TBEC
81.0000 mg | DELAYED_RELEASE_TABLET | Freq: Every day | ORAL | Status: DC
  Administered 2021-02-10 – 2021-02-12 (×3): 81 mg via ORAL
  Filled 2021-02-10 (×3): qty 1

## 2021-02-10 MED ORDER — TAMSULOSIN HCL 0.4 MG PO CAPS
0.4000 mg | ORAL_CAPSULE | Freq: Every day | ORAL | Status: DC
  Administered 2021-02-10 – 2021-02-11 (×2): 0.4 mg via ORAL
  Filled 2021-02-10 (×2): qty 1

## 2021-02-10 MED ORDER — PREDNISOLONE ACETATE 1 % OP SUSP: 1.0000 [drp] | Freq: Four times a day (QID) | OPHTHALMIC | Status: DC

## 2021-02-10 MED ORDER — BISACODYL 10 MG RE SUPP: 10.0000 mg | Freq: Every day | RECTAL | Status: DC | PRN

## 2021-02-10 MED ORDER — CLOPIDOGREL BISULFATE 75 MG PO TABS
75.0000 mg | ORAL_TABLET | Freq: Every day | ORAL | Status: DC
  Administered 2021-02-10 – 2021-02-12 (×3): 75 mg via ORAL
  Filled 2021-02-10 (×3): qty 1

## 2021-02-10 MED ORDER — ESCITALOPRAM OXALATE 10 MG PO TABS
20.0000 mg | ORAL_TABLET | Freq: Every morning | ORAL | Status: DC
  Administered 2021-02-10 – 2021-02-12 (×3): 20 mg via ORAL
  Filled 2021-02-10 (×3): qty 2

## 2021-02-10 NOTE — ED Notes (Signed)
Pt lying in bed at this time in no distress. Bed lowered and locked with call bell in reach. Side rails upx2. Vss. Will continue to monitor patient.

## 2021-02-10 NOTE — H&P (Signed)
Patient Demographics:    Dustin [name], is a 87 y.o. male  MRN: [id number]   DOB - Mar 14, 1934  Admit Date - 02/10/2021  Outpatient Primary MD for the patient is [name], MD   Assessment & Plan:    Principal Problem:   COPD exacerbation ([place]) Active Problems:   Acute respiratory failure with hypoxia ([place])   Acute metabolic encephalopathy   AKI (acute kidney injury) ([place])   HYPERTENSION, BENIGN   Coronary artery disease   Hypothyroidism   Dementia (H[facility])   Chronic diastolic CHF (congestive heart failure) ([place])   1)Acute COPD Exacerbation-patient is a reformed smoker, no definite pneumonia,  treat empirically with IV Solu-Medrol 40 mg every 8 hours, give mucolytics, doxycycline and bronchodilators as ordered, supplemental oxygen as ordered.   2)Chronic Pain Syndrome---c/n home opiate regimen including oxycodone  3)Acute Hypoxic Resp Failure---due to # 1 above, manage as above #1  4)AKI--- Creatinine is up to 1.75 from baseline of 1.0 -Hold Lasix,  -renally adjust medications, avoid nephrotoxic agents / dehydration  / hypotension  5)CAD/HFpEF/dCHF/HTN-----Multivessel CAD status post CABG and subsequent DES intervention - LHC on 12/10/2019 showed Severe triple vessel CAD s/p 5V CABG with 2/5 patent bypass grafts.  --Echo from 12/10/2019 with EF of 55 to 60 % with Grade 1 dCHF BNP is 153 and CXR w/o acute CHF findings -Hold Lasix -Troponin 28 >>30 -Continue ASA 81 mg and Plavix for lifetime given the left main stenting. Continue crestor and Imdur  6)Acute Metabolic Encephalopthy--- wife reports confusion, psychotic episodes and disorientation at home associated with increased coughing and shortness of breath ---suspect partly opiate related , compounded by AKI and hypoxia  7)Hypothyroidism---  continue levothyroxine  8)OSA (obstructive sleep apnea) -resume the use of CPAP QHS.  9)-GERD -Continue PPI  10)History of BPH -Continue finasteride and Flomax. -No urinary retention.  11)-Alzheimer's disease with depression/anxiety -Stable mood -Continue memantine, buspirone, Lexapro, Seroquel -Use Lorazepam prn  12)Chronic Anemia--- hemoglobin is 11.9 which is close to present baseline -No evidence of ongoing bleeding    Disposition/Need for in-Hospital Stay- patient unable to be discharged at this time due to --- acute metabolic encephalopathy as noted above #6, COPD exhibition with hypoxia requiring IV steroids and supplemental oxygen*  Dispo: The patient is from: Home              Anticipated d/c is to: Home              Anticipated d/c date is: 2 days              Patient currently is not medically stable to d/c. Barriers: Not Clinically Stable-    With History of - Reviewed by me  Past Medical History:  Diagnosis Date  . Alzheimer disease ([place])   . Anxiety   . Arthritis   . Atrophic gastritis    a. By EGD 02/2013.  . Carotid artery disease ([place])    a. mild-mod plaque, <  50% stenosis bilat by duplex 2018.  [name] Coronary atherosclerosis of native coronary artery    a. Multivessel s/p CABG 1996. b. s/p DES x 2 SVG to PDA 8/12 with distal disease managed medically.  . DDD (degenerative disc disease)    Chronic back pain  . Enlarged prostate   . Essential hypertension   . Hematuria   . Hypothyroidism   . LBBB (left bundle branch block)   . MI (myocardial infarction) ([place])   . Mixed hyperlipidemia   . OA (osteoarthritis)   . OSA (obstructive sleep apnea)   . Pneumonia due to COVID-19 virus    February 2021  . Sinus bradycardia    a. Aricept and BB discontinued due to this.      Past Surgical History:  Procedure Laterality Date  . COLONOSCOPY  08/03/2004   [place]-numerous large diverticula in the descending, transverse, descending, and sigmoid colon.  Otherwise normal exam.  . COLONOSCOPY  07/12/2012   RMR: External hemorrhoidal tag; multiple rectal and colonic polyps removed and/or treated as described above. Pancolonic diverticulosis. Bx-tubular adenomas, rectal hyperplastic polyp. next colonoscopy in 06/2015.  [name] COLONOSCOPY N/A 06/22/2016   Procedure: COLONOSCOPY;  Surgeon: Aviva Signs, MD;  [facility];  Service: Gastroenterology;  Laterality: N/A;  730  . CORONARY ANGIOPLASTY WITH STENT PLACEMENT    . CORONARY ARTERY BYPASS GRAFT  1996   LIMA to LAD, SVG to D2, SVG to PDA, SVG to OM1 and OM2  . CORONARY STENT INTERVENTION N/A 12/10/2019   Procedure: CORONARY STENT INTERVENTION;  Surgeon: Burnell Blanks, MD;  Location: [place] [facility];  Service: Cardiovascular;  Laterality: N/A;  . ESOPHAGOGASTRODUODENOSCOPY N/A 03/16/2013   Procedure: ESOPHAGOGASTRODUODENOSCOPY (EGD);  Surgeon: Daneil Dolin, MD;  [facility]: [facility];  Service: Endoscopy;  Laterality: N/A;  12:00-moved to [place] notified pt  . ESOPHAGOGASTRODUODENOSCOPY N/A 03/06/2013   Procedure: ESOPHAGOGASTRODUODENOSCOPY (EGD);  Surgeon: Daneil Dolin, MD;  [facility];  Service: Endoscopy;  Laterality: N/A;  . HERNIA REPAIR    . INTRAVASCULAR ULTRASOUND/IVUS N/A 12/10/2019   Procedure: Intravascular Ultrasound/IVUS;  Surgeon: Burnell Blanks, MD;  Location: [place] [facility];  Service: Cardiovascular;  Laterality: N/A;  . LEFT HEART CATH AND CORS/GRAFTS ANGIOGRAPHY N/A 12/10/2019   Procedure: LEFT HEART CATH AND CORS/GRAFTS ANGIOGRAPHY;  Surgeon: Burnell Blanks, MD;  Location: [place] [facility];  Service: Cardiovascular;  Laterality: N/A;      Chief Complaint  Patient presents with  . Shortness of Breath      HPI:    Dustin [name]  is a 87 y.o. male who is a reformed smoker with history of dementia, COPD,h/o prior covid 19 infection,  OSA, chronic pain syndrome/opiate dependence and BPH , as well as  CAD/HFpEF/dCHF/HTN-----Multivessel CAD status post CABG and subsequent DES intervention - LHC on 12/10/2019 showed Severe triple vessel CAD s/p 5V CABG with 2/5 patent bypass grafts.  --Echo from 12/10/2019 with EF of 55 to 60 % with Grade 1 dCHF BNP is 153 and CXR w/o acute CHF findings  --Patient presents to the ED with confusional episodes worsening cough and hypoxia -I called and obtain additional history from patient's wife--wife was concerned about   patient's confusion and respiratory status, she did home Covid test which was negative --As per wife patient has had cough for over a week now, no vomiting no diarrhea -He has been taking very high-dose oxycodone at least 4 times a day- -in the ED is  found to have an AKI with creatinine up to 1.75 from a baseline of 1 -Chest x-ray without acute finding and BNP was 153, troponins were flat -Patient and wife denies chest pain -Patient was found to be hypoxic requiring 2 to 3 L of oxygen via nasal cannula -CBC without leukocytosis remarkable for stable anemia  Review of systems:    In addition to the HPI above,   A full Review of  Systems was done, all other systems reviewed are negative except as noted above in HPI , .   Social History:  Reviewed by me    Social History   Tobacco Use  . Smoking status: Former Smoker    Packs/day: 2.00    Years: 40.00    Pack years: 80.00    Types: Cigarettes    Quit date: 12/27/1994    Years since quitting: 26.1  . Smokeless tobacco: Never Used  Substance Use Topics  . Alcohol use: No    Alcohol/week: 0.0 standard drinks       Family History :  Reviewed by me    Family History  Problem Relation Age of Onset  . Heart disease Other   . Heart attack Mother   . Colon cancer Neg Hx      Home Medications:   Prior to Admission medications   Medication Sig Start Date End Date Taking? Authorizing Provider  albuterol (PROVENTIL) (2.5 MG/3ML) 0.083% nebulizer solution Take 3 mLs (2.5 mg  total) by nebulization every 6 (six) hours as needed for wheezing or shortness of breath. Patient will also need the nebulizer machine with this prescription. 10/22/18  Yes Nat Christen, MD  albuterol (VENTOLIN HFA) 108 (90 Base) MCG/ACT inhaler Inhale 2 puffs into the lungs every 6 (six) hours as needed for wheezing or shortness of breath.   Yes [provider]  aspirin EC 81 MG tablet Take 81 mg by mouth daily.   Yes [provider]  busPIRone (BUSPAR) 10 MG tablet Take 10 mg by mouth 2 (two) times daily. 11/03/18  Yes [provider]  clopidogrel (PLAVIX) 75 MG tablet TAKE 1 TABLET BY MOUTH DAILY. 09/03/20  Yes Satira Sark, MD  doxazosin (CARDURA) 1 MG tablet Take 1 mg by mouth at bedtime. 09/02/20  Yes [provider]  dutasteride (AVODART) 0.5 MG capsule Take 0.5 mg by mouth. In the afternoon   Yes [provider]  escitalopram (LEXAPRO) 20 MG tablet Take 20 mg by mouth every morning.    Yes [provider]  furosemide (LASIX) 20 MG tablet Take 20 mg by mouth 2 (two) times daily. 01/27/21  Yes [provider]  isosorbide mononitrate (IMDUR) 30 MG 24 hr tablet Take 1 tablet (30 mg total) by mouth daily. 03/17/20  Yes Satira Sark, MD  isosorbide mononitrate (IMDUR) 60 MG 24 hr tablet Take 60 mg by mouth every morning. 01/13/21  Yes [provider]  levothyroxine (SYNTHROID, LEVOTHROID) 50 MCG tablet Take 50 mcg by mouth every morning. 10/04/16  Yes [provider]  memantine (NAMENDA) 10 MG tablet Take 10 mg by mouth 2 (two) times daily.  03/05/16  Yes [provider]  MOVANTIK 25 MG TABS tablet Take 25 mg by mouth daily. 08/30/20  Yes [provider]  nitroGLYCERIN (NITROSTAT) 0.4 MG SL tablet Place 1 tablet (0.4 mg total) under the tongue every 5 (five) minutes as needed for chest pain. Pt has bought nitro bottle from home and oxycodone 30 mg ( 3 or  4 tabs) is in nitro bottle per wife Omarie Parcell.  11/04/20  Yes Satira Sark, MD  oxycodone (ROXICODONE) 30 MG immediate release tablet Take 30 mg by mouth every 4 (four) hours as needed for pain. Take 1 tablet by mouth every 4 to 6 hours as needed for pain.  Max 8 /24 hours. 12/09/16  Yes [provider]  potassium chloride SA (K-DUR,KLOR-CON) 20 MEQ tablet Take 20 mEq by mouth daily.    Yes [provider]  QUEtiapine (SEROQUEL) 25 MG tablet Take 25 mg by mouth at bedtime. 11/19/19  Yes [provider]  rosuvastatin (CRESTOR) 40 MG tablet Take 1 tablet (40 mg total) by mouth daily at 6 PM. 12/11/19 09/21/20 Yes Hollice Gong, Mir Mohammed, MD  tamsulosin (FLOMAX) 0.4 MG CAPS capsule Take 1 capsule (0.4 mg total) by mouth daily. To help you pee. 12/08/19  Yes Enzo Bi, MD  Vitamin D, Ergocalciferol, (DRISDOL) 1.25 MG (50000 UNIT) CAPS capsule Take 50,000 Units by mouth once a week. 02/07/21  Yes [provider]  zinc sulfate 220 (50 Zn) MG capsule Take 1 capsule (220 mg total) by mouth daily. 02/17/20  Yes Barton Dubois, MD  alfuzosin (UROXATRAL) 10 MG 24 hr tablet Take 1 tablet (10 mg total) by mouth daily with breakfast. Patient not taking: No sig reported 10/24/20   Cleon Gustin, MD  Bromfenac Sodium (PROLENSA) 0.07 % SOLN Place 1 drop into the right eye 4 (four) times daily. Patient not taking: Reported on 02/10/2021 08/28/20   Bernarda Caffey, MD  hydrOXYzine (ATARAX/VISTARIL) 25 MG tablet Take 25 mg by mouth 2 (two) times daily as needed for anxiety.  Patient not taking: No sig reported 11/19/19   [provider]  pantoprazole (PROTONIX) 40 MG tablet Take 1 tablet (40 mg total) by mouth every morning. Patient not taking: No sig reported 02/16/20   Barton Dubois, MD  prednisoLONE acetate (PRED FORTE) 1 % ophthalmic suspension Place 1 drop into the right eye 4 (four) times daily. Patient not taking: No sig reported 10/27/20   Bernarda Caffey, MD     Allergies:    No Known Allergies   Physical  Exam:   Vitals  Blood pressure (!) 143/60, pulse 62, temperature 97.6 F (36.4 C), temperature source Oral, resp. rate 17, height 5\' 11"  (1.803 m), weight 96.1 kg, SpO2 98 %.  Physical Examination: General appearance - alert, intermittent confusion and disorientation  mental status -intermittent episodes of psychosis and confusion  eyes - sclera anicteric Nose- [place] 3L/min Neck - supple, no JVD elevation , Chest -diminished breath sounds with few scattered wheezes  heart - S1 and S2 normal, regular , prior CABG scar Abdomen - soft, nontender, nondistended, increased truncal adiposity with ventral hernia Neurological -generalized weakness without new focal deficits extremities - no pedal edema noted, intact peripheral pulses  Skin - warm, dry     Data Review:    CBC Recent Labs  Lab 02/10/21 1017  WBC 7.3  HGB 11.9*  HCT 37.2*  PLT 165  MCV 96.6  MCH 30.9  MCHC 32.0  RDW 13.6  LYMPHSABS 1.0  MONOABS 0.6  EOSABS 0.1  BASOSABS 0.0   ------------------------------------------------------------------------------------------------------------------  Chemistries  Recent Labs  Lab 02/10/21 1017  NA 133*  K 3.1*  CL 99  CO2 26  GLUCOSE 134*  BUN 26*  CREATININE 1.75*  CALCIUM 8.6*   ------------------------------------------------------------------------------------------------------------------ estimated creatinine clearance is 37.2 mL/min (A) (by C-G formula based on SCr of 1.75 mg/dL (H)). ------------------------------------------------------------------------------------------------------------------  No results for input(s): TSH, T4TOTAL, T3FREE, THYROIDAB in the last 72 hours.  Invalid input(s): FREET3   Coagulation profile No results for input(s): INR, PROTIME in the last 168 hours. ------------------------------------------------------------------------------------------------------------------- No results for input(s): DDIMER in the last 72  hours. -------------------------------------------------------------------------------------------------------------------  Cardiac Enzymes No results for input(s): CKMB, TROPONINI, MYOGLOBIN in the last 168 hours.  Invalid input(s): CK ------------------------------------------------------------------------------------------------------------------    Component Value Date/Time   BNP 153.0 (H) 02/10/2021 1017     ---------------------------------------------------------------------------------------------------------------  Urinalysis    Component Value Date/Time   COLORURINE YELLOW 02/10/2021 1213   APPEARANCEUR HAZY (A) 02/10/2021 1213   APPEARANCEUR Clear 10/24/2020 1111   LABSPEC 1.013 02/10/2021 1213   PHURINE 5.0 02/10/2021 1213   GLUCOSEU NEGATIVE 02/10/2021 1213   HGBUR MODERATE (A) 02/10/2021 1213   BILIRUBINUR NEGATIVE 02/10/2021 1213   BILIRUBINUR Negative 10/24/2020 1111   KETONESUR 5 (A) 02/10/2021 1213   PROTEINUR 100 (A) 02/10/2021 1213   UROBILINOGEN 0.2 01/26/2015 2247   NITRITE NEGATIVE 02/10/2021 1213   LEUKOCYTESUR NEGATIVE 02/10/2021 1213    ----------------------------------------------------------------------------------------------------------------   Imaging Results:    DG Chest Port 1 View  Result Date: 02/10/2021 CLINICAL DATA:  Shortness of breath and increasing chest pain over the past few days. EXAM: PORTABLE CHEST 1 VIEW COMPARISON:  Single-view of the chest and CT chest 09/21/2020. FINDINGS: The lungs are emphysematous but clear. The patient is status post CABG. Heart size is upper normal. No pneumothorax or pleural effusion. Aortic atherosclerosis. No acute or focal bony abnormality. IMPRESSION: No acute disease. Aortic Atherosclerosis (ICD10-I70.0) and Emphysema (ICD10-J43.9). Electronically Signed   By: Inge Rise M.D.   On: 02/10/2021 10:56    Radiological Exams on Admission: DG Chest Port 1 View  Result Date:  02/10/2021 CLINICAL DATA:  Shortness of breath and increasing chest pain over the past few days. EXAM: PORTABLE CHEST 1 VIEW COMPARISON:  Single-view of the chest and CT chest 09/21/2020. FINDINGS: The lungs are emphysematous but clear. The patient is status post CABG. Heart size is upper normal. No pneumothorax or pleural effusion. Aortic atherosclerosis. No acute or focal bony abnormality. IMPRESSION: No acute disease. Aortic Atherosclerosis (ICD10-I70.0) and Emphysema (ICD10-J43.9). Electronically Signed   By: Inge Rise M.D.   On: 02/10/2021 10:56    DVT Prophylaxis -SCD /heparin AM Labs Ordered, also please review Full Orders  Family Communication: Admission, patients condition and plan of care including tests being ordered have been discussed with the patient and wife who indicate understanding and agree with the plan   Code Status - Full Code  Likely DC to home after resolution of altered mentation  Condition   stable  Roxan Hockey M.D on 02/10/2021 at 5:48 PM Go to [email or web address] -  for contact info  [name] - Office  [phone]

## 2021-02-10 NOTE — ED Provider Notes (Signed)
[facility] EMERGENCY DEPARTMENT Provider Note  CSN: [id number] Arrival date & time: 02/10/21 1761    History Chief Complaint  Patient presents with  . Shortness of Breath    HPI  Dustin [name] is a 87 y.o. male with history of dementia, COPD and extensive CAD brought to the ED via EMS from home for SOB and chest pain. Patient gives varying history but apparently had had coughing and wheezing for some time, treated at home with nebs. Was reporting some chest pain but denies it now. He lives at home with his wife but niece who is in town told EMS they needed more help at home. His brother recently died which he remembers. He took a home Covid test last night that was reportedly negative. He was noted to be hypoxic on RA on arrival, placed on 2L with improvement, does not usually wear home O2.    Past Medical History:  Diagnosis Date  . Alzheimer disease ([place])   . Anxiety   . Arthritis   . Atrophic gastritis    a. By EGD 02/2013.  . Carotid artery disease ([place])    a. mild-mod plaque, <50% stenosis bilat by duplex 2018.  [name] Coronary atherosclerosis of native coronary artery    a. Multivessel s/p CABG 1996. b. s/p DES x 2 SVG to PDA 8/12 with distal disease managed medically.  . DDD (degenerative disc disease)    Chronic back pain  . Enlarged prostate   . Essential hypertension   . Hematuria   . Hypothyroidism   . LBBB (left bundle branch block)   . MI (myocardial infarction) ([place])   . Mixed hyperlipidemia   . OA (osteoarthritis)   . OSA (obstructive sleep apnea)   . Pneumonia due to COVID-19 virus    February 2021  . Sinus bradycardia    a. Aricept and BB discontinued due to this.    Past Surgical History:  Procedure Laterality Date  . COLONOSCOPY  08/03/2004   Jenkins-numerous large diverticula in the descending, transverse, descending, and sigmoid colon. Otherwise normal exam.  . COLONOSCOPY  07/12/2012   RMR: External hemorrhoidal tag; multiple rectal and colonic  polyps removed and/or treated as described above. Pancolonic diverticulosis. Bx-tubular adenomas, rectal hyperplastic polyp. next colonoscopy in 06/2015.  [name] COLONOSCOPY N/A 06/22/2016   Procedure: COLONOSCOPY;  Surgeon: Aviva Signs, MD;  Location: [facility];  Service: Gastroenterology;  Laterality: N/A;  730  . CORONARY ANGIOPLASTY WITH STENT PLACEMENT    . CORONARY ARTERY BYPASS GRAFT  1996   LIMA to LAD, SVG to D2, SVG to PDA, SVG to OM1 and OM2  . CORONARY STENT INTERVENTION N/A 12/10/2019   Procedure: CORONARY STENT INTERVENTION;  Surgeon: Burnell Blanks, MD;  Location: [place] [facility];  Service: Cardiovascular;  Laterality: N/A;  . ESOPHAGOGASTRODUODENOSCOPY N/A 03/16/2013   Procedure: ESOPHAGOGASTRODUODENOSCOPY (EGD);  Surgeon: Daneil Dolin, MD;  Location: [facility];  Service: Endoscopy;  Laterality: N/A;  12:00-moved to [place] notified pt  . ESOPHAGOGASTRODUODENOSCOPY N/A 03/06/2013   Procedure: ESOPHAGOGASTRODUODENOSCOPY (EGD);  Surgeon: Daneil Dolin, MD;  Location: [facility];  Service: Endoscopy;  Laterality: N/A;  . HERNIA REPAIR    . INTRAVASCULAR ULTRASOUND/IVUS N/A 12/10/2019   Procedure: Intravascular Ultrasound/IVUS;  Surgeon: Burnell Blanks, MD;  Location: [place] [facility];  Service: Cardiovascular;  Laterality: N/A;  . LEFT HEART CATH AND CORS/GRAFTS ANGIOGRAPHY N/A 12/10/2019   Procedure: LEFT HEART CATH AND CORS/GRAFTS ANGIOGRAPHY;  Surgeon: Angelena Form,  Annita Brod, MD;  Location: [place] [facility];  Service: Cardiovascular;  Laterality: N/A;    Family History  Problem Relation Age of Onset  . Heart disease Other   . Heart attack Mother   . Colon cancer Neg Hx     Social History   Tobacco Use  . Smoking status: Former Smoker    Packs/day: 2.00    Years: 40.00    Pack years: 80.00    Types: Cigarettes    Quit date: 12/27/1994    Years since quitting: 26.1  . Smokeless tobacco: Never Used  Vaping Use  . Vaping Use:  Never used  Substance Use Topics  . Alcohol use: No    Alcohol/week: 0.0 standard drinks  . Drug use: No     Home Medications Prior to Admission medications   Medication Sig Start Date End Date Taking? Authorizing Provider  albuterol (PROVENTIL) (2.5 MG/3ML) 0.083% nebulizer solution Take 3 mLs (2.5 mg total) by nebulization every 6 (six) hours as needed for wheezing or shortness of breath. Patient will also need the nebulizer machine with this prescription. 10/22/18  Yes Nat Christen, MD  albuterol (VENTOLIN HFA) 108 (90 Base) MCG/ACT inhaler Inhale 2 puffs into the lungs every 6 (six) hours as needed for wheezing or shortness of breath.   Yes [provider]  aspirin EC 81 MG tablet Take 81 mg by mouth daily.   Yes [provider]  busPIRone (BUSPAR) 10 MG tablet Take 10 mg by mouth 2 (two) times daily. 11/03/18  Yes [provider]  clopidogrel (PLAVIX) 75 MG tablet TAKE 1 TABLET BY MOUTH DAILY. 09/03/20  Yes Satira Sark, MD  doxazosin (CARDURA) 1 MG tablet Take 1 mg by mouth at bedtime. 09/02/20  Yes [provider]  dutasteride (AVODART) 0.5 MG capsule Take 0.5 mg by mouth. In the afternoon   Yes [provider]  escitalopram (LEXAPRO) 20 MG tablet Take 20 mg by mouth every morning.    Yes [provider]  furosemide (LASIX) 20 MG tablet Take 20 mg by mouth 2 (two) times daily. 01/27/21  Yes [provider]  isosorbide mononitrate (IMDUR) 30 MG 24 hr tablet Take 1 tablet (30 mg total) by mouth daily. 03/17/20  Yes Satira Sark, MD  isosorbide mononitrate (IMDUR) 60 MG 24 hr tablet Take 60 mg by mouth every morning. 01/13/21  Yes [provider]  levothyroxine (SYNTHROID, LEVOTHROID) 50 MCG tablet Take 50 mcg by mouth every morning. 10/04/16  Yes [provider]  memantine (NAMENDA) 10 MG tablet Take 10 mg by mouth 2 (two) times daily.  03/05/16  Yes [provider]  MOVANTIK 25 MG TABS tablet Take  25 mg by mouth daily. 08/30/20  Yes [provider]  nitroGLYCERIN (NITROSTAT) 0.4 MG SL tablet Place 1 tablet (0.4 mg total) under the tongue every 5 (five) minutes as needed for chest pain. Pt has bought nitro bottle from home and oxycodone 30 mg ( 3 or 4 tabs) is in nitro bottle per wife Finian Helvey. 11/04/20  Yes Satira Sark, MD  oxycodone (ROXICODONE) 30 MG immediate release tablet Take 30 mg by mouth every 4 (four) hours as needed for pain. Take 1 tablet by mouth every 4 to 6 hours as needed for pain.  Max 8 /24 hours. 12/09/16  Yes [provider]  potassium chloride SA (K-DUR,KLOR-CON) 20 MEQ tablet Take 20 mEq by mouth daily.    Yes [provider]  QUEtiapine (  SEROQUEL) 25 MG tablet Take 25 mg by mouth at bedtime. 11/19/19  Yes [provider]  rosuvastatin (CRESTOR) 40 MG tablet Take 1 tablet (40 mg total) by mouth daily at 6 PM. 12/11/19 09/21/20 Yes Hollice Gong, Mir Mohammed, MD  tamsulosin (FLOMAX) 0.4 MG CAPS capsule Take 1 capsule (0.4 mg total) by mouth daily. To help you pee. 12/08/19  Yes Enzo Bi, MD  Vitamin D, Ergocalciferol, (DRISDOL) 1.25 MG (50000 UNIT) CAPS capsule Take 50,000 Units by mouth once a week. 02/07/21  Yes [provider]  zinc sulfate 220 (50 Zn) MG capsule Take 1 capsule (220 mg total) by mouth daily. 02/17/20  Yes Barton Dubois, MD  alfuzosin (UROXATRAL) 10 MG 24 hr tablet Take 1 tablet (10 mg total) by mouth daily with breakfast. Patient not taking: No sig reported 10/24/20   Cleon Gustin, MD  Bromfenac Sodium (PROLENSA) 0.07 % SOLN Place 1 drop into the right eye 4 (four) times daily. Patient not taking: Reported on 02/10/2021 08/28/20   Bernarda Caffey, MD  hydrOXYzine (ATARAX/VISTARIL) 25 MG tablet Take 25 mg by mouth 2 (two) times daily as needed for anxiety.  Patient not taking: No sig reported 11/19/19   [provider]  pantoprazole (PROTONIX) 40 MG tablet Take 1 tablet (40 mg total) by mouth  every morning. Patient not taking: No sig reported 02/16/20   Barton Dubois, MD  prednisoLONE acetate (PRED FORTE) 1 % ophthalmic suspension Place 1 drop into the right eye 4 (four) times daily. Patient not taking: No sig reported 10/27/20   Bernarda Caffey, MD     Allergies    Patient has no known allergies.   Review of Systems   Review of Systems A comprehensive review of systems was completed and negative except as noted in HPI.    Physical Exam BP (!) 149/89   Pulse 72   Temp 99.6 F (37.6 C) (Oral)   Resp 18   SpO2 94%   Physical Exam Vitals and nursing note reviewed.  Constitutional:      Appearance: Normal appearance.  HENT:     Head: Normocephalic and atraumatic.     Nose: Nose normal.     Mouth/Throat:     Mouth: Mucous membranes are moist.  Eyes:     Extraocular Movements: Extraocular movements intact.     Conjunctiva/sclera: Conjunctivae normal.  Cardiovascular:     Rate and Rhythm: Normal rate.  Pulmonary:     Effort: Pulmonary effort is normal. No respiratory distress.     Breath sounds: Wheezing and rhonchi present.  Abdominal:     General: Abdomen is flat.     Palpations: Abdomen is soft.     Tenderness: There is no abdominal tenderness.  Musculoskeletal:        General: No swelling. Normal range of motion.     Cervical back: Neck supple.  Skin:    General: Skin is warm and dry.  Neurological:     General: No focal deficit present.     Mental Status: He is alert. He is disoriented.  Psychiatric:        Mood and Affect: Mood normal.      ED Results / Procedures / Treatments   Labs (all labs ordered are listed, but only abnormal results are displayed) Labs Reviewed  BRAIN NATRIURETIC PEPTIDE - Abnormal; Notable for the following components:      Result Value   B Natriuretic Peptide 153.0 (*)    All other components within normal limits  CBC WITH DIFFERENTIAL/PLATELET - Abnormal; Notable for the following components:   RBC 3.85 (*)     Hemoglobin 11.9 (*)    HCT 37.2 (*)    All other components within normal limits  BASIC METABOLIC PANEL - Abnormal; Notable for the following components:   Sodium 133 (*)    Potassium 3.1 (*)    Glucose, Bld 134 (*)    BUN 26 (*)    Creatinine, Ser 1.75 (*)    Calcium 8.6 (*)    GFR, Estimated 38 (*)    All other components within normal limits  TROPONIN I (HIGH SENSITIVITY) - Abnormal; Notable for the following components:   Troponin I (High Sensitivity) 28 (*)    All other components within normal limits  RESP PANEL BY RT-PCR (FLU A&B, COVID) ARPGX2  URINE CULTURE  URINALYSIS, ROUTINE W REFLEX MICROSCOPIC  TROPONIN I (HIGH SENSITIVITY)    EKG EKG Interpretation  Date/Time:  Tuesday February 10 2021 09:43:31 EST Ventricular Rate:  71 PR Interval:    QRS Duration: 145 QT Interval:  397 QTC Calculation: 432 R Axis:   31 Text Interpretation: Sinus rhythm IVCD, consider atypical LBBB No significant change since last tracing Confirmed by Calvert Cantor [phone]) on 02/10/2021 10:12:33 AM   Radiology DG Chest Port 1 View  Result Date: 02/10/2021 CLINICAL DATA:  Shortness of breath and increasing chest pain over the past few days. EXAM: PORTABLE CHEST 1 VIEW COMPARISON:  Single-view of the chest and CT chest 09/21/2020. FINDINGS: The lungs are emphysematous but clear. The patient is status post CABG. Heart size is upper normal. No pneumothorax or pleural effusion. Aortic atherosclerosis. No acute or focal bony abnormality. IMPRESSION: No acute disease. Aortic Atherosclerosis (ICD10-I70.0) and Emphysema (ICD10-J43.9). Electronically Signed   By: Inge Rise M.D.   On: 02/10/2021 10:56    Procedures Procedures  Medications Ordered in the ED Medications  methylPREDNISolone sodium succinate (SOLU-MEDROL) 40 mg/mL injection 40 mg (has no administration in time range)  ipratropium-albuterol (DUONEB) 0.5-2.5 (3) MG/3ML nebulizer solution 3 mL (has no administration in time range)   albuterol (PROVENTIL) (2.5 MG/3ML) 0.083% nebulizer solution 2.5 mg (has no administration in time range)  guaiFENesin (MUCINEX) 12 hr tablet 600 mg (has no administration in time range)  albuterol (VENTOLIN HFA) 108 (90 Base) MCG/ACT inhaler 4 puff (4 puffs Inhalation Given 02/10/21 1012)  methylPREDNISolone sodium succinate (SOLU-MEDROL) 125 mg/2 mL injection 125 mg (125 mg Intravenous Given 02/10/21 1011)     MDM Rules/Calculators/A&P MDM Patient with SOB, hypoxia, and wheezing. Unclear if he was having chest pain or not, he gives varying accounts but he denies chest pain now. He is on ASA and Plavix for prior CABG and Stents. Will give albuterol inhaler while awaiting Covid test. Solumedrol. Check labs and CXR.  ED Course  I have reviewed the triage vital signs and the nursing notes.  Pertinent labs & imaging results that were available during my care of the patient were reviewed by me and considered in my medical decision making (see chart for details).  Clinical Course as of 02/10/21 1216  Tue Feb 10, 2021  1035 CBC is at baseline.  [CS]  1051 BNP is mildly elevated.  [CS]  1108 BMP with mild increase Cr from baseline. Trop is also mildly elevated of unclear significance. Will continue to trend.  [CS]  1205 Patient improved but still on supplemental oxygen. Will discuss admission with hospitalist. Suspect COPD as the underlying etiology for today's symptoms.  [CS]  9242  Spoke with Dr. Maurene Capes, Hospitalist, who will evaluate for admission.  [CS]    Clinical Course User Index [CS] Truddie Hidden, MD    Final Clinical Impression(s) / ED Diagnoses Final diagnoses:  COPD exacerbation [facility])    Rx / DC Orders ED Discharge Orders    None       Truddie Hidden, MD 02/10/21 1216

## 2021-02-10 NOTE — Progress Notes (Signed)
[name] called to discuss possible discharge plans. Wife stated that the pt's dementia has become increasingly worse and he has begun to become difficult to take care of. She would like to be called in the AM to discuss the possibility of facility placement.

## 2021-02-10 NOTE — ED Triage Notes (Addendum)
Pt brought in by [facility] from home with c/o SOB and chest pain. EMS reports pt has an extensive cardiac history. Pt reports he has been "coughing and wheezing" x 3 weeks. Denies chest pain at this time.   EMS reports pt lives at home with his wife, but has little to no help from family. Pt's niece was in from out of town helping them and asked EMS to let us know that they need help at home.   Family reported to EMS that pt has been "talking out of his head" at night. They did a home Covid test last night that was negative.  Pt's RA sat 87% upon arrival to ED. Pt placed on 2L O2 via [place] and O2 sat increased to 97%.

## 2021-02-10 NOTE — ED Notes (Signed)
Daughter-In-Law, Faron Tudisco called to speak with the nurse.  Hanley Seamen Brooke her message that,"Pt will attempt to get out of bed" and to call her

## 2021-02-11 DIAGNOSIS — N400 Enlarged prostate without lower urinary tract symptoms: Secondary | ICD-10-CM | POA: Diagnosis present

## 2021-02-11 DIAGNOSIS — I110 Hypertensive heart disease with heart failure: Secondary | ICD-10-CM | POA: Diagnosis present

## 2021-02-11 DIAGNOSIS — N179 Acute kidney failure, unspecified: Secondary | ICD-10-CM | POA: Diagnosis present

## 2021-02-11 DIAGNOSIS — F1120 Opioid dependence, uncomplicated: Secondary | ICD-10-CM | POA: Diagnosis present

## 2021-02-11 DIAGNOSIS — Z8616 Personal history of COVID-19: Secondary | ICD-10-CM | POA: Diagnosis not present

## 2021-02-11 DIAGNOSIS — F419 Anxiety disorder, unspecified: Secondary | ICD-10-CM | POA: Diagnosis present

## 2021-02-11 DIAGNOSIS — Z20822 Contact with and (suspected) exposure to covid-19: Secondary | ICD-10-CM | POA: Diagnosis present

## 2021-02-11 DIAGNOSIS — R739 Hyperglycemia, unspecified: Secondary | ICD-10-CM | POA: Diagnosis not present

## 2021-02-11 DIAGNOSIS — F32A Depression, unspecified: Secondary | ICD-10-CM | POA: Diagnosis present

## 2021-02-11 DIAGNOSIS — E039 Hypothyroidism, unspecified: Secondary | ICD-10-CM | POA: Diagnosis present

## 2021-02-11 DIAGNOSIS — J9601 Acute respiratory failure with hypoxia: Secondary | ICD-10-CM | POA: Diagnosis present

## 2021-02-11 DIAGNOSIS — J441 Chronic obstructive pulmonary disease with (acute) exacerbation: Secondary | ICD-10-CM | POA: Diagnosis present

## 2021-02-11 DIAGNOSIS — R531 Weakness: Secondary | ICD-10-CM | POA: Diagnosis present

## 2021-02-11 DIAGNOSIS — K2940 Chronic atrophic gastritis without bleeding: Secondary | ICD-10-CM | POA: Diagnosis present

## 2021-02-11 DIAGNOSIS — G309 Alzheimer's disease, unspecified: Secondary | ICD-10-CM | POA: Diagnosis present

## 2021-02-11 DIAGNOSIS — I2510 Atherosclerotic heart disease of native coronary artery without angina pectoris: Secondary | ICD-10-CM | POA: Diagnosis present

## 2021-02-11 DIAGNOSIS — G894 Chronic pain syndrome: Secondary | ICD-10-CM | POA: Diagnosis present

## 2021-02-11 DIAGNOSIS — I5032 Chronic diastolic (congestive) heart failure: Secondary | ICD-10-CM | POA: Diagnosis present

## 2021-02-11 DIAGNOSIS — T380X5A Adverse effect of glucocorticoids and synthetic analogues, initial encounter: Secondary | ICD-10-CM | POA: Diagnosis not present

## 2021-02-11 DIAGNOSIS — R7309 Other abnormal glucose: Secondary | ICD-10-CM | POA: Diagnosis not present

## 2021-02-11 DIAGNOSIS — G4733 Obstructive sleep apnea (adult) (pediatric): Secondary | ICD-10-CM | POA: Diagnosis present

## 2021-02-11 DIAGNOSIS — K219 Gastro-esophageal reflux disease without esophagitis: Secondary | ICD-10-CM | POA: Diagnosis present

## 2021-02-11 DIAGNOSIS — D649 Anemia, unspecified: Secondary | ICD-10-CM | POA: Diagnosis present

## 2021-02-11 DIAGNOSIS — F0280 Dementia in other diseases classified elsewhere without behavioral disturbance: Secondary | ICD-10-CM | POA: Diagnosis present

## 2021-02-11 DIAGNOSIS — G9341 Metabolic encephalopathy: Secondary | ICD-10-CM | POA: Diagnosis present

## 2021-02-11 LAB — BASIC METABOLIC PANEL
Anion gap: 10 (ref 5–15)
BUN: 24 mg/dL — ABNORMAL HIGH (ref 8–23)
CO2: 26 mmol/L (ref 22–32)
Calcium: 8.8 mg/dL — ABNORMAL LOW (ref 8.9–10.3)
Chloride: 102 mmol/L (ref 98–111)
Creatinine, Ser: 1.17 mg/dL (ref 0.61–1.24)
GFR, Estimated: >60 mL/min (ref >60)
Glucose, Bld: 170 mg/dL — ABNORMAL HIGH (ref 70–99)
Potassium: 3.3 mmol/L — ABNORMAL LOW (ref 3.5–5.1)
Sodium: 138 mmol/L (ref 135–145)

## 2021-02-11 LAB — CBC
HCT: 36.8 % — ABNORMAL LOW (ref 39.0–52.0)
Hemoglobin: 11.4 g/dL — ABNORMAL LOW (ref 13.0–17.0)
MCH: 29.8 pg (ref 26.0–34.0)
MCHC: 31 g/dL (ref 30.0–36.0)
MCV: 96.1 fL (ref 80.0–100.0)
Platelets: 157 10*3/uL (ref 150–400)
RBC: 3.83 MIL/uL — ABNORMAL LOW (ref 4.22–5.81)
RDW: 13.3 % (ref 11.5–15.5)
WBC: 4.6 10*3/uL (ref 4.0–10.5)
nRBC: 0 % (ref 0.0–0.2)

## 2021-02-11 LAB — GLUCOSE, CAPILLARY
Glucose-Capillary: 114 mg/dL — ABNORMAL HIGH (ref 70–99)
Glucose-Capillary: 141 mg/dL — ABNORMAL HIGH (ref 70–99)
Glucose-Capillary: 160 mg/dL — ABNORMAL HIGH (ref 70–99)
Glucose-Capillary: 170 mg/dL — ABNORMAL HIGH (ref 70–99)
Glucose-Capillary: 184 mg/dL — ABNORMAL HIGH (ref 70–99)

## 2021-02-11 MED ORDER — IPRATROPIUM-ALBUTEROL 0.5-2.5 (3) MG/3ML IN SOLN
3.0000 mL | Freq: Three times a day (TID) | RESPIRATORY_TRACT | Status: DC
  Administered 2021-02-11 – 2021-02-12 (×4): 3 mL via RESPIRATORY_TRACT
  Filled 2021-02-11 (×4): qty 3

## 2021-02-11 MED ORDER — INSULIN ASPART 100 UNIT/ML ~~LOC~~ SOLN
0.0000 [IU] | Freq: Three times a day (TID) | SUBCUTANEOUS | Status: DC
  Administered 2021-02-11 – 2021-02-12 (×3): 2 [IU] via SUBCUTANEOUS

## 2021-02-11 MED ORDER — INSULIN ASPART 100 UNIT/ML ~~LOC~~ SOLN: 0.0000 [IU] | Freq: Every day | SUBCUTANEOUS | Status: DC

## 2021-02-11 MED ORDER — POTASSIUM CHLORIDE CRYS ER 20 MEQ PO TBCR
40.0000 meq | EXTENDED_RELEASE_TABLET | Freq: Once | ORAL | Status: AC
  Administered 2021-02-11: 40 meq via ORAL
  Filled 2021-02-11: qty 2

## 2021-02-11 MED ORDER — ALBUTEROL SULFATE (2.5 MG/3ML) 0.083% IN NEBU: 2.5000 mg | INHALATION_SOLUTION | RESPIRATORY_TRACT | Status: DC | PRN

## 2021-02-11 NOTE — Plan of Care (Signed)
  Problem: Acute Rehab PT Goals(only PT should resolve) Goal: Pt Will Go Supine/Side To Sit Outcome: Progressing Flowsheets (Taken 02/11/2021 1353) Pt will go Supine/Side to Sit: with modified independence Goal: Patient Will Transfer Sit To/From Stand Outcome: Progressing Flowsheets (Taken 02/11/2021 1353) Patient will transfer sit to/from stand: with supervision Goal: Pt Will Transfer Bed To Chair/Chair To Bed Outcome: Progressing Flowsheets (Taken 02/11/2021 1353) Pt will Transfer Bed to Chair/Chair to Bed: with supervision Goal: Pt Will Ambulate Outcome: Progressing Flowsheets (Taken 02/11/2021 1353) Pt will Ambulate:  > 125 feet  with supervision  with rolling walker  with cane   1:54 PM, 02/11/21 Lonell Grandchild, MPT Physical Therapist with [facility] [phone] office [phone] mobile phone

## 2021-02-11 NOTE — Progress Notes (Addendum)
Patient Demographics:    Dustin [name], is a 87 y.o. male, DOB - [birth year], CZY:[id number]  Admit date - 02/10/2021   Admitting Physician Elliyah Liszewski Denton Brick, MD  Outpatient Primary MD for the patient is [name], MD  LOS - 0   Chief Complaint  Patient presents with  . Shortness of Breath        Subjective:    Dustin [name] today has no fevers, no emesis,  No chest pain,   --Urine output acceptable -Cough and shortness of breath as well as hypoxia persist -Patient with dyspnea on exertion -Overall less confused, more coherent  Assessment  & Plan :    Principal Problem:   COPD exacerbation (HCC) Active Problems:   Acute respiratory failure with hypoxia (HCC)   Acute metabolic encephalopathy   AKI (acute kidney injury) ([place])   HYPERTENSION, BENIGN   Coronary artery disease   Hypothyroidism   Dementia (HCC)   Chronic diastolic CHF (congestive heart failure) (HCC)   COPD (chronic obstructive pulmonary disease) (HCC)  Brief Summary:- 87 y.o. male who is a reformed smokerwith history of dementia, COPD,h/o prior covid 19 infection,  OSA, chronic pain syndrome/opiate dependence and BPH , as well as CAD/HFpEF/dCHF/HTN-----Multivessel CAD status post CABG and subsequent DES intervention  admitted on 02/10/2021 with increasing confusion/disorientation, and found to have COPD exacerbation with acute hypoxic respiratory failure requiring supplemental oxygen and acute kidney injury  A/p 1)Acute COPD Exacerbation-patient is a reformed smoker, no definite pneumonia, - -remains hypoxic, dyspnea on exertion persist -Requiring oxygen via nasal cannula at 3 L/min -c/n IV Solu-Medrol 40 mg every 8 hours,  mucolytics, doxycycline and bronchodilators as ordered, supplemental oxygen as ordered.   2)Chronic Pain Syndrome---c/n home opiate regimen including oxycodone -PTA patient was on very high-dose  opiates -Opiate withdrawal if stopped abruptly  3)Acute Hypoxic Resp Failure---due to # 1 above, manage as above #1  4)AKI--- Creatinine is up to 1.75 from baseline of 1.0 -Hold Lasix,  -It is down to 1.1 after hydration -renally adjust medications, avoid nephrotoxic agents / dehydration  / hypotension  5)CAD/HFpEF/dCHF/HTN-----Multivessel CAD status post CABG and subsequent DES intervention - LHC on 12/10/2019 showed Severe triple vessel CAD s/p 5V CABG with 2/5 patent bypass grafts.  --Echo from 12/10/2019 with EF of 55 to 60 % with Grade 1 dCHF BNP is 153 and CXR w/o acute CHF findings -Hold Lasix -Troponin 28 >>30 -Continue ASA 81 mg and Plavix for lifetime given the left main stenting. Continue crestor and Imdur  6)Acute Metabolic Encephalopthy--- wife reports confusion, psychotic episodes and disorientation at home associated with increased coughing and shortness of breath ---suspect partly opiate related , compounded by AKI and hypoxia -Overall less confused and more coherent  7)Hypothyroidism--- continue levothyroxine  8)OSA (obstructive sleep apnea) -resume the use of CPAP QHS.  9)-GERD -Continue PPI  10)History of BPH -Continue finasteride and Flomax. -No urinary retention.  11)-Alzheimer's disease with depression/anxiety -Stable mood -Continue memantine, buspirone, Lexapro, Seroquel -Use Lorazepam prn  12)Chronic Anemia--- hemoglobin is above 11 which is close to present baseline -No evidence of ongoing bleeding   13) generalized weakness and deconditioning--await physical therapy eval  14) steroid-induced hyperglycemia----Use Novolog/Humalog Sliding scale insulin with Accu-Cheks/Fingersticks as ordered   Disposition/Need for in-Hospital Stay- patient unable  to be discharged at this time due to --- acute metabolic encephalopathy as noted above #6, COPD exhibition with hypoxia requiring IV steroids and supplemental oxygen*  Status is:  Inpatient  Remains inpatient appropriate because:Please see above   Disposition: The patient is from: Home              Anticipated d/c is to: Home Vs SNF              Anticipated d/c date is: 1 day              Patient currently is not medically stable to d/c. Barriers: Not Clinically Stable-   Code Status :  -  Code Status: Full Code   Family Communication-- Discussed with wife  Consults  :  na  DVT Prophylaxis  :   - SCDs  heparin injection 5,000 Units Start: 02/10/21 2200 SCDs Start: 02/10/21 1735 Place TED hose Start: 02/10/21 1735    Lab Results  Component Value Date   PLT 157 02/11/2021    Inpatient Medications  Scheduled Meds: . aspirin EC  81 mg Oral Daily  . busPIRone  10 mg Oral BID  . clopidogrel  75 mg Oral Daily  . doxazosin  1 mg Oral QHS  . doxycycline  100 mg Oral Q12H  . escitalopram  20 mg Oral q morning  . guaiFENesin  600 mg Oral BID  . heparin  5,000 Units Subcutaneous Q8H  . insulin aspart  0-5 Units Subcutaneous QHS  . insulin aspart  0-9 Units Subcutaneous TID WC  . ipratropium-albuterol  3 mL Nebulization TID  . isosorbide mononitrate  60 mg Oral q morning  . levothyroxine  50 mcg Oral q morning  . memantine  10 mg Oral BID  . methylPREDNISolone (SOLU-MEDROL) injection  40 mg Intravenous Q8H  . potassium chloride SA  20 mEq Oral Daily  . potassium chloride  40 mEq Oral Once  . QUEtiapine  25 mg Oral QHS  . rosuvastatin  40 mg Oral q1800  . sodium chloride flush  3 mL Intravenous Q12H  . sodium chloride flush  3 mL Intravenous Q12H  . tamsulosin  0.4 mg Oral Daily  . [START ON 02/14/2021] Vitamin D (Ergocalciferol)  50,000 Units Oral Q Sat  . zinc sulfate  220 mg Oral Daily   Continuous Infusions: . sodium chloride 100 mL/hr at 02/11/21 1017  . sodium chloride     PRN Meds:.sodium chloride, acetaminophen **OR** acetaminophen, albuterol, bisacodyl, guaiFENesin-dextromethorphan, LORazepam, ondansetron **OR** ondansetron (ZOFRAN) IV,  oxycodone, polyethylene glycol, sodium chloride flush, traZODone    Anti-infectives (From admission, onward)   Start     Dose/Rate Route Frequency Ordered Stop   02/10/21 1230  doxycycline (VIBRA-TABS) tablet 100 mg        100 mg Oral Every 12 hours 02/10/21 1217          Objective:   Vitals:   02/10/21 2139 02/11/21 0504 02/11/21 0818 02/11/21 0847  BP: (!) 121/53 (!) 128/53 (!) 149/66   Pulse: (!) 58 (!) 50 66   Resp:  16 (!) 23   Temp: 97.9 F (36.6 C) 97.7 F (36.5 C) 98 F (36.7 C)   TempSrc: Oral Oral Oral   SpO2: 97% 98% 97% 97%  Weight:      Height:        Wt Readings from Last 3 Encounters:  02/10/21 96.1 kg  10/24/20 93.4 kg  07/25/20 (!) 93.4 kg     Intake/Output Summary (  Last 24 hours) at 02/11/2021 1135 Last data filed at 02/11/2021 0900 Gross per 24 hour  Intake 370.8 ml  Output --  Net 370.8 ml    Physical Exam  Gen:- Awake Alert, dyspnea on exertion persist HEENT:- [place].AT, No sclera icterus Neck-Supple Neck,No JVD,.  Nose- [place] 3L/min Lungs-diminished breath sounds with scattered wheezing CV- S1, S2 normal, regular , prior CABG scar Abd-  +ve B.Sounds, Abd Soft, No tenderness,    Extremity/Skin:- No  edema, pedal pulses present  Psych-much less confused, more coherent  neuro-generalized weakness ,no new focal deficits, no tremors   Data Review:   Micro Results Recent Results (from the past 240 hour(s))  Resp Panel by RT-PCR (Flu A&B, Covid) Nasopharyngeal Swab     Status: None   Collection Time: 02/10/21  9:52 AM   Specimen: Nasopharyngeal Swab; Nasopharyngeal(NP) swabs in vial transport medium  Result Value Ref Range Status   SARS Coronavirus 2 by RT PCR NEGATIVE NEGATIVE Final    Comment: (NOTE) SARS-CoV-2 target nucleic acids are NOT DETECTED.  The SARS-CoV-2 RNA is generally detectable in upper respiratory specimens during the acute phase of infection. The lowest concentration of SARS-CoV-2 viral copies this assay can detect is 138  copies/mL. A negative result does not preclude SARS-Cov-2 infection and should not be used as the sole basis for treatment or other patient management decisions. A negative result may occur with  improper specimen collection/handling, submission of specimen other than nasopharyngeal swab, presence of viral mutation(s) within the areas targeted by this assay, and inadequate number of viral copies(<138 copies/mL). A negative result must be combined with clinical observations, patient history, and epidemiological information. The expected result is Negative.  Fact Sheet for Patients:  EntrepreneurPulse.com.au  Fact Sheet for Healthcare Providers:  IncredibleEmployment.be  This test is no t yet approved or cleared by the [place] FDA and  has been authorized for detection and/or diagnosis of SARS-CoV-2 by FDA under an Emergency Use Authorization (EUA). This EUA will remain  in effect (meaning this test can be used) for the duration of the COVID-19 declaration under Section 564(b)(1) of the Act, 21 U.S.C.section 360bbb-3(b)(1), unless the authorization is terminated  or revoked sooner.       Influenza A by PCR NEGATIVE NEGATIVE Final   Influenza B by PCR NEGATIVE NEGATIVE Final    Comment: (NOTE) The Xpert Xpress SARS-CoV-2/FLU/RSV plus assay is intended as an aid in the diagnosis of influenza from Nasopharyngeal swab specimens and should not be used as a sole basis for treatment. Nasal washings and aspirates are unacceptable for Xpert Xpress SARS-CoV-2/FLU/RSV testing.  Fact Sheet for Patients: EntrepreneurPulse.com.au  Fact Sheet for Healthcare Providers: IncredibleEmployment.be  This test is not yet approved or cleared by the [place] FDA and has been authorized for detection and/or diagnosis of SARS-CoV-2 by FDA under an Emergency Use Authorization (EUA). This EUA will remain in effect (meaning  this test can be used) for the duration of the COVID-19 declaration under Section 564(b)(1) of the Act, 21 U.S.C. section 360bbb-3(b)(1), unless the authorization is terminated or revoked.  Performed at [facility], [address] [place], [place] [address]     Radiology Reports DG Chest [place] 1 View  Result Date: 02/10/2021 CLINICAL DATA:  Shortness of breath and increasing chest pain over the past few days. EXAM: PORTABLE CHEST 1 VIEW COMPARISON:  Single-view of the chest and CT chest 09/21/2020. FINDINGS: The lungs are emphysematous but clear. The patient is status post CABG. Heart size is  upper normal. No pneumothorax or pleural effusion. Aortic atherosclerosis. No acute or focal bony abnormality. IMPRESSION: No acute disease. Aortic Atherosclerosis (ICD10-I70.0) and Emphysema (ICD10-J43.9). Electronically Signed   By: Inge Rise M.D.   On: 02/10/2021 10:56     CBC Recent Labs  Lab 02/10/21 1017 02/11/21 0344  WBC 7.3 4.6  HGB 11.9* 11.4*  HCT 37.2* 36.8*  PLT 165 157  MCV 96.6 96.1  MCH 30.9 29.8  MCHC 32.0 31.0  RDW 13.6 13.3  LYMPHSABS 1.0  --   MONOABS 0.6  --   EOSABS 0.1  --   BASOSABS 0.0  --     Chemistries  Recent Labs  Lab 02/10/21 1017 02/11/21 0344  NA 133* 138  K 3.1* 3.3*  CL 99 102  CO2 26 26  GLUCOSE 134* 170*  BUN 26* 24*  CREATININE 1.75* 1.17  CALCIUM 8.6* 8.8*   ------------------------------------------------------------------------------------------------------------------ No results for input(s): CHOL, HDL, LDLCALC, TRIG, CHOLHDL, LDLDIRECT in the last 72 hours.  Lab Results  Component Value Date   HGBA1C 5.7 (H) 06/29/2014   ------------------------------------------------------------------------------------------------------------------ No results for input(s): TSH, T4TOTAL, T3FREE, THYROIDAB in the last 72 hours.  Invalid input(s):  FREET3 ------------------------------------------------------------------------------------------------------------------ No results for input(s): VITAMINB12, FOLATE, FERRITIN, TIBC, IRON, RETICCTPCT in the last 72 hours.  Coagulation profile No results for input(s): INR, PROTIME in the last 168 hours.  No results for input(s): DDIMER in the last 72 hours.  Cardiac Enzymes No results for input(s): CKMB, TROPONINI, MYOGLOBIN in the last 168 hours.  Invalid input(s): CK ------------------------------------------------------------------------------------------------------------------    Component Value Date/Time   BNP 153.0 (H) 02/10/2021 1017     Roxan Hockey M.D on 02/11/2021 at 11:35 AM  Go to [email or web address] - for contact info  [name] - Office  [phone]

## 2021-02-11 NOTE — Evaluation (Signed)
Physical Therapy Evaluation Patient Details Name: Dustin [name] MRN: [id number] DOB: [birth year] Today's Date: 02/11/2021   History of Present Illness  Dustin [name]  is a 87 y.o. male who is a reformed smoker with history of dementia, COPD,h/o prior covid 19 infection,  OSA, chronic pain syndrome/opiate dependence and BPH , as well as CAD/HFpEF/dCHF/HTN-----Multivessel CAD status post CABG and subsequent DES intervention -  LHC on 12/10/2019 showed Severe triple vessel CAD s/p 5V CABG with 2/5 patent bypass grafts.   --Echo from 12/10/2019 with EF of 55 to 60 % with Grade 1 dCHF  BNP is 153 and CXR w/o acute CHF findings    Clinical Impression  Patient functioning near baseline for functional mobility and gait, unsteady on feet with tendency to lean on nearby objects for support when not using an AD, required use of RW for safe transfers and ambulation in room/hallway without loss of balance.  Patient tolerated sitting up in chair after therapy - RN notified.  Patient will benefit from continued physical therapy in hospital and recommended venue below to increase strength, balance, endurance for safe ADLs and gait.      Follow Up Recommendations Home health PT;Supervision for mobility/OOB;Supervision - Intermittent    Equipment Recommendations  None recommended by PT    Recommendations for Other Services       Precautions / Restrictions Precautions Precautions: Fall Restrictions Weight Bearing Restrictions: No      Mobility  Bed Mobility Overal bed mobility: Needs Assistance Bed Mobility: Supine to Sit     Supine to sit: Supervision;HOB elevated     General bed mobility comments: increased time, slightly labored movement    Transfers Overall transfer level: Needs assistance Equipment used: Rolling walker (2 wheeled);None Transfers: Sit to/from [employer] to Stand: Min guard Stand pivot transfers: Min guard       General transfer comment:  unsteady on feet without use of an AD, required use of RW for safety  Ambulation/Gait Ambulation/Gait assistance: Min guard;Supervision Gait Distance (Feet): 65 Feet Assistive device: Rolling walker (2 wheeled) Gait Pattern/deviations: Decreased step length - left;Decreased stance time - right;Decreased stride length Gait velocity: decreased   General Gait Details: slightly labored cadence without loss of balance, limited mostly due to fatigue  Stairs            Wheelchair Mobility    Modified Rankin (Stroke Patients Only)       Balance Overall balance assessment: Needs assistance Sitting-balance support: Feet supported;No upper extremity supported Sitting balance-Leahy Scale: Good Sitting balance - Comments: seated at EOB   Standing balance support: During functional activity;No upper extremity supported Standing balance-Leahy Scale: Poor Standing balance comment: fair/poor without AD, fair/good using RW                             Pertinent Vitals/Pain Pain Assessment: Faces Faces Pain Scale: Hurts a little bit Pain Location: generalized pain all over Pain Descriptors / Indicators: Shooting;Discomfort Pain Intervention(s): Limited activity within patient's tolerance;Monitored during session    [place] expects to be discharged to:: Private residence Living Arrangements: Spouse/significant other Available Help at Discharge: Family Type of Home: House Home Access: Level entry     Home Layout: One level Home Equipment: Environmental consultant - 2 wheels;Cane - single point      Prior Function Level of Independence: Independent with assistive device(s)         Comments: household and short distanced community  ambulator with SPC PRN, drives     Hand Dominance   Dominant Hand: Right    Extremity/Trunk Assessment   Upper Extremity Assessment Upper Extremity Assessment: Overall WFL for tasks assessed    Lower Extremity Assessment Lower  Extremity Assessment: Generalized weakness    Cervical / Trunk Assessment Cervical / Trunk Assessment: Normal  Communication   Communication: No difficulties  Cognition Arousal/Alertness: Awake/alert Behavior During Therapy: WFL for tasks assessed/performed Overall Cognitive Status: Within Functional Limits for tasks assessed                                        General Comments      Exercises     Assessment/Plan    PT Assessment Patient needs continued PT services  PT Problem List Decreased strength;Decreased activity tolerance;Decreased balance;Decreased mobility       PT Treatment Interventions DME instruction;Gait training;Stair training;Functional mobility training;Therapeutic activities;Therapeutic exercise;Patient/family education;Balance training    PT Goals (Current goals can be found in the Care Plan section)  Acute Rehab PT Goals Patient Stated Goal: return home with family to assist PT Goal Formulation: With patient Time For Goal Achievement: 02/18/21 Potential to Achieve Goals: Good    Frequency Min 3X/week   Barriers to discharge        Co-evaluation               AM-PAC PT "6 Clicks" Mobility  Outcome Measure Help needed turning from your back to your side while in a flat bed without using bedrails?: None Help needed moving from lying on your back to sitting on the side of a flat bed without using bedrails?: A Little Help needed moving to and from a bed to a chair (including a wheelchair)?: A Little Help needed standing up from a chair using your arms (e.g., wheelchair or bedside chair)?: A Little Help needed to walk in hospital room?: A Little Help needed climbing 3-5 steps with a railing? : A Lot 6 Click Score: 18    End of Session Equipment Utilized During Treatment: Oxygen Activity Tolerance: Patient tolerated treatment well;Patient limited by fatigue Patient left: in chair;with call bell/phone within reach;with chair  alarm set Nurse Communication: Mobility status PT Visit Diagnosis: Unsteadiness on feet (R26.81);Other abnormalities of gait and mobility (R26.89);Muscle weakness (generalized) (M62.81)    Time: 9983-3825 PT Time Calculation (min) (ACUTE ONLY): 30 min   Charges:   PT Evaluation $PT Eval Moderate Complexity: 1 Mod PT Treatments $Therapeutic Activity: 23-37 mins        1:51 PM, 02/11/21 Lonell Grandchild, MPT Physical Therapist with [facility] [phone] office [phone] mobile phone

## 2021-02-11 NOTE — TOC Initial Note (Signed)
Transition of Care [facility]) - Initial/Assessment Note    Patient Details  Name: Dustin [name] MRN: [id number] Date of Birth: 1934/11/12  Transition of [facility]) CM/SW Contact:    Ihor Gully, LCSW Phone Number: 02/11/2021, 2:35 PM  Clinical Narrative:                 Patient from home with spouse. States that he has [place] active and someone named Corene Cornea comes out to the home. He could not recall the name. At baseline, he ambulates independently, is independent with ADLs, drives. Discussed PT recommendation for continued HHPT. Patient is agreeable. Message left for wife requesting return contact.   Expected Discharge Plan: [place] Barriers to Discharge: Continued Medical Work up   Patient Goals and CMS Choice Patient states their goals for this hospitalization and ongoing recovery are:: return home      Expected Discharge Plan and Services Expected Discharge Plan: [place]       Living arrangements for the past 2 months: Single Family Home                                      Prior Living Arrangements/Services Living arrangements for the past 2 months: Single Family Home Lives with:: Spouse Patient language and need for interpreter reviewed:: Yes Do you feel safe going back to the place where you live?: Yes      Need for Family Participation in Patient Care: Yes (Comment) Care giver support system in place?: Yes (comment)   Criminal Activity/Legal Involvement Pertinent to Current Situation/Hospitalization: No - Comment as needed  Activities of Daily Living Home Assistive Devices/Equipment: Cane (specify quad or straight) ADL Screening (condition at time of admission) Patient's cognitive ability adequate to safely complete daily activities?: Yes Is the patient deaf or have difficulty hearing?: No Does the patient have difficulty seeing, even when wearing glasses/contacts?: No Does the patient have difficulty  concentrating, remembering, or making decisions?: Yes Patient able to express need for assistance with ADLs?: Yes Does the patient have difficulty dressing or bathing?: No Independently performs ADLs?: Yes (appropriate for developmental age) Does the patient have difficulty walking or climbing stairs?: Yes Weakness of Legs: Both Weakness of Arms/Hands: None  Permission Sought/Granted                  Emotional Assessment Appearance:: Appears stated age   Affect (typically observed): Appropriate Orientation: : Oriented to Self,Oriented to Place,Oriented to  Time,Oriented to Situation Alcohol / Substance Use: Not Applicable Psych Involvement: No (comment)  Admission diagnosis:  COPD exacerbation ([place]) [U44.0] Acute metabolic encephalopathy [H47.42] COPD (chronic obstructive pulmonary disease) ([place]) [J44.9] Patient Active Problem List   Diagnosis Date Noted  . Acute metabolic encephalopathy 59/56/3875  . AKI (acute kidney injury) ([place]) 02/10/2021  . Dysuria 10/24/2020  . Pneumonia due to COVID-19 virus 02/13/2020  . COPD (chronic obstructive pulmonary disease) ([place]) 12/10/2019  . COPD exacerbation ([place]) 12/07/2019  . Unstable angina ([place]) 11/07/2018  . Fall 11/07/2018  . Gait instability 11/07/2018  . Leukocytosis 11/07/2018  . Depression 11/07/2018  . Benign prostatic hyperplasia with urinary obstruction 11/07/2018  . Chronic pain 11/07/2018  . Pressure injury of skin 02/15/2017  . Chronic diastolic CHF (congestive heart failure) ([place]) 12/24/2016  . Sinus bradycardia 12/23/2016  . Foot pain, bilateral 12/23/2016  . Hypokalemia 04/05/2016  . Acute respiratory failure ([place]) 04/03/2016  .  Acute encephalopathy 04/03/2016  . Hypothyroidism 04/03/2016  . Aspiration pneumonia ([place]) 04/03/2016  . Dementia ([place]) 04/03/2016  . Acute respiratory failure with hypoxia ([place]) 04/03/2016  . Memory loss 04/20/2015  . Hypoxia 01/27/2015  . CAP (community acquired pneumonia)  01/27/2015  . Fever 08/30/2014  . Healthcare associated bacterial pneumonia 08/30/2014  . Toxic Metabolic encephalopathy 40/09/2724  . Sepsis ([place]) 08/30/2014  . Neck pain on right side 08/29/2014  . Neck pain 08/29/2014  . Left-sided weakness 06/28/2014  . Chest pain 06/27/2014  . Tubular adenoma of colon 03/05/2013  . Anorexia 03/05/2013  . Loss of weight 03/05/2013  . Chronic constipation 07/04/2012  . Bronchitis 08/16/2011  . Hyperlipidemia 10/01/2009  . OSA (obstructive sleep apnea) 10/01/2009  . Alzheimer disease ([place]) 10/01/2009  . HYPERTENSION, BENIGN 10/01/2009  . Coronary artery disease 10/01/2009   PCP:  [name], MD Pharmacy:   [facility], [place] [address] Phone: [phone] Fax: [phone]     Social Determinants of Health (SDOH) Interventions    Readmission Risk Interventions Readmission Risk Prevention Plan 02/15/2020  Transportation Screening Complete  PCP or Specialist Appt within 3-5 Days Not Complete  HRI or [place] Complete  Social Work Consult for [place] Planning/Counseling Complete  Palliative Care Screening Not Complete  Medication Review Press photographer) Complete  Some recent data might be hidden

## 2021-02-11 NOTE — Progress Notes (Signed)
Pt continues to have N/V at this time. Pt's nurse aware and gave zofran.  Students holding potassium tablets r/t N/V. Pt's nurse, [place], aware. Pt in bed with HOB raised.

## 2021-02-12 DIAGNOSIS — G9341 Metabolic encephalopathy: Secondary | ICD-10-CM | POA: Diagnosis not present

## 2021-02-12 DIAGNOSIS — J441 Chronic obstructive pulmonary disease with (acute) exacerbation: Secondary | ICD-10-CM | POA: Diagnosis not present

## 2021-02-12 DIAGNOSIS — N179 Acute kidney failure, unspecified: Secondary | ICD-10-CM | POA: Diagnosis not present

## 2021-02-12 DIAGNOSIS — J1282 Pneumonia due to coronavirus disease 2019: Secondary | ICD-10-CM | POA: Diagnosis not present

## 2021-02-12 DIAGNOSIS — U071 COVID-19: Secondary | ICD-10-CM | POA: Diagnosis not present

## 2021-02-12 LAB — RENAL FUNCTION PANEL
Albumin: 3 g/dL — ABNORMAL LOW (ref 3.5–5.0)
Anion gap: 5 (ref 5–15)
BUN: 26 mg/dL — ABNORMAL HIGH (ref 8–23)
CO2: 30 mmol/L (ref 22–32)
Calcium: 8.9 mg/dL (ref 8.9–10.3)
Chloride: 103 mmol/L (ref 98–111)
Creatinine, Ser: 1.03 mg/dL (ref 0.61–1.24)
GFR, Estimated: >60 mL/min
Glucose, Bld: 154 mg/dL — ABNORMAL HIGH (ref 70–99)
Phosphorus: 2.6 mg/dL (ref 2.5–4.6)
Potassium: 4 mmol/L (ref 3.5–5.1)
Sodium: 138 mmol/L (ref 135–145)

## 2021-02-12 LAB — CBC
HCT: 34.8 % — ABNORMAL LOW (ref 39.0–52.0)
Hemoglobin: 11 g/dL — ABNORMAL LOW (ref 13.0–17.0)
MCH: 30.7 pg (ref 26.0–34.0)
MCHC: 31.6 g/dL (ref 30.0–36.0)
MCV: 97.2 fL (ref 80.0–100.0)
Platelets: 159 10*3/uL (ref 150–400)
RBC: 3.58 MIL/uL — ABNORMAL LOW (ref 4.22–5.81)
RDW: 13.2 % (ref 11.5–15.5)
WBC: 10.3 10*3/uL (ref 4.0–10.5)
nRBC: 0 % (ref 0.0–0.2)

## 2021-02-12 LAB — GLUCOSE, CAPILLARY
Glucose-Capillary: 149 mg/dL — ABNORMAL HIGH (ref 70–99)
Glucose-Capillary: 120 mg/dL — ABNORMAL HIGH (ref 70–99)
Glucose-Capillary: 160 mg/dL — ABNORMAL HIGH (ref 70–99)

## 2021-02-12 LAB — URINE CULTURE
Culture: NO GROWTH
Special Requests: NORMAL

## 2021-02-12 LAB — HEMOGLOBIN A1C
Hgb A1c MFr Bld: 6.5 % — ABNORMAL HIGH (ref 4.8–5.6)
Mean Plasma Glucose: 140 mg/dL

## 2021-02-12 MED ORDER — LORAZEPAM 1 MG PO TABS
1.0000 mg | ORAL_TABLET | Freq: Once | ORAL | Status: AC
  Administered 2021-02-12: 1 mg via ORAL
  Filled 2021-02-12: qty 1

## 2021-02-12 MED ORDER — DOXYCYCLINE HYCLATE 100 MG PO TABS: 100.0000 mg | ORAL_TABLET | Freq: Two times a day (BID) | ORAL | 0 refills | Status: AC

## 2021-02-12 MED ORDER — GUAIFENESIN ER 600 MG PO TB12: 600.0000 mg | ORAL_TABLET | Freq: Two times a day (BID) | ORAL | 0 refills | Status: DC

## 2021-02-12 MED ORDER — ALBUTEROL SULFATE HFA 108 (90 BASE) MCG/ACT IN AERS: 2.0000 | INHALATION_SPRAY | Freq: Four times a day (QID) | RESPIRATORY_TRACT | 1 refills | Status: DC | PRN

## 2021-02-12 MED ORDER — PREDNISONE 20 MG PO TABS: 40.0000 mg | ORAL_TABLET | Freq: Every day | ORAL | 0 refills | Status: AC

## 2021-02-12 MED ORDER — IPRATROPIUM-ALBUTEROL 0.5-2.5 (3) MG/3ML IN SOLN: 3.0000 mL | Freq: Two times a day (BID) | RESPIRATORY_TRACT | Status: DC

## 2021-02-12 MED ORDER — BUDESONIDE-FORMOTEROL FUMARATE 160-4.5 MCG/ACT IN AERO: 2.0000 | INHALATION_SPRAY | Freq: Two times a day (BID) | RESPIRATORY_TRACT | 12 refills | Status: DC

## 2021-02-12 MED ORDER — FUROSEMIDE 20 MG PO TABS: 20.0000 mg | ORAL_TABLET | Freq: Every morning | ORAL | 3 refills | Status: DC

## 2021-02-12 NOTE — TOC Progression Note (Signed)
Transition of Care [facility]) - Progression Note    Patient Details  Name: Dustin [name] MRN: [id number] Date of Birth: Mar 07, 1934  Transition of Care [facility]) CM/SW Contact  Ihor Gully, LCSW Phone Number: 02/12/2021, 11:35 AM  Clinical Narrative:    Discussed PT recommendation of [place] with Dustin [name]. She is agreeable to [facility]. [facility] accepted referral and patient's insurance on 02/11/21.    Expected Discharge Plan: [place] Barriers to Discharge: Continued Medical Work up  Expected Discharge Plan and Services Expected Discharge Plan: [place] arrangements for the past 2 months: [place]: RN [facility] Agency: [place] ([place]) Date [place]: 02/11/21 Time [place]: 1134 Representative spoke with at [place]: [place] ([place]) Interventions    Readmission Risk Interventions Readmission Risk Prevention Plan 02/12/2021 02/15/2020  Transportation Screening - Complete  PCP or Specialist Appt within 3-5 Days - Not Complete  HRI or [place] - Complete  Social Work Consult for [place] Planning/Counseling - Complete  Palliative Care Screening - Not Complete  Medication Review Press photographer) - Complete  PCP or Specialist appointment within 3-5 days of discharge Not Complete -  PCP/Specialist Appt Not Complete comments first available was 02/23/21 -  [place] or Home Care Consult Complete -  SW Recovery Care/Counseling Consult Complete -  Palliative Care Screening Not Applicable -  [place] Not Applicable -  Some recent data might be hidden

## 2021-02-12 NOTE — Progress Notes (Signed)
     SATURATION QUALIFICATIONS: (This note is used to comply with regulatory documentation for home oxygen)   Patient Saturations on Room Air at Rest = 86%   Patient Saturations on Room Air while Ambulating = 84 %   Patient Saturations on 2 Liters of oxygen while Ambulating = 93 %       Patient needs continuous O2 at 2 L/min continuously via nasal cannula with humidifier, with gaseous portability and conserving device    Dx--COPD  Roxan Hockey, MD

## 2021-02-12 NOTE — Discharge Instructions (Addendum)
1)you need oxygen at home at 2 L via nasal cannula continuously while awake and while asleep--- smoking or having open fires around oxygen can cause fire, significant injury and death  2) your blood pressure is a bit on the low side so we have reduced your isosorbide/Imdur to 30 mg daily from 90 mg, we have also reduced your Lasix from 20 mg twice daily to 20 mg once a day  3) please note several changes to your medications

## 2021-02-12 NOTE — TOC Transition Note (Signed)
Transition of Care [facility]) - CM/SW Discharge Note   Patient Details  Name: Dustin [name] MRN: [id number] Date of Birth: 1934-01-30  Transition of Care [facility]) CM/SW Contact:  Ihor Gully, LCSW Phone Number: 02/12/2021, 12:01 PM   Clinical Narrative:    DME choices discussed. Oxygen referral made to [facility].      Barriers to Discharge: Continued Medical Work up   Patient Goals and CMS Choice Patient states their goals for this hospitalization and ongoing recovery are:: return home      Discharge Placement                       Discharge Plan and Services                DME Arranged: Oxygen DME Agency: [facility] Date DME Agency Contacted: 02/12/21 Time DME Agency Contacted: 84 Representative spoke with at DME Agency: [place]: RN [place]: [place] ([place]) Date [place]: 02/11/21 Time [place]: 1134 Representative spoke with at [place]: [place] ([place]) Interventions     Readmission Risk Interventions Readmission Risk Prevention Plan 02/12/2021 02/15/2020  Transportation Screening - Complete  PCP or Specialist Appt within 3-5 Days - Not Complete  HRI or [place] - Complete  Social Work Consult for [place] Planning/Counseling - Complete  Palliative Care Screening - Not Complete  Medication Review Press photographer) - Complete  PCP or Specialist appointment within 3-5 days of discharge Not Complete -  PCP/Specialist Appt Not Complete comments first available was 02/23/21 -  [place] or [place] Complete -  SW Recovery Care/Counseling Consult Complete -  Palliative Care Screening Not Applicable -  [place] Not Applicable -  Some recent data might be hidden

## 2021-02-12 NOTE — Discharge Summary (Signed)
Dustin [name], is a 87 y.o. male  DOB 30-Jul-1934  MRN [id number].  Admission date:  02/10/2021  Admitting Physician  Roxan Hockey, MD  Discharge Date:  02/12/2021   Primary MD  [name], MD  Recommendations for primary care physician for things to follow:   1)you need oxygen at home at 2 L via nasal cannula continuously while awake and while asleep--- smoking or having open fires around oxygen can cause fire, significant injury and death  2) your blood pressure is a bit on the low side so we have reduced your isosorbide/Imdur to 30 mg daily from 90 mg, we have also reduced your Lasix from 20 mg twice daily to 20 mg once a day  3) please note several changes to your medications  Admission Diagnosis  COPD exacerbation ([place]) [W23.7] Acute metabolic encephalopathy [S28.31] COPD (chronic obstructive pulmonary disease) ([place]) [J44.9]   Discharge Diagnosis  COPD exacerbation ([place]) [D17.6] Acute metabolic encephalopathy [H60.73] COPD (chronic obstructive pulmonary disease) ([place]) [J44.9]    Principal Problem:   COPD exacerbation ([place]) Active Problems:   Acute respiratory failure with hypoxia ([place])   Acute metabolic encephalopathy   AKI (acute kidney injury) ([place])   HYPERTENSION, BENIGN   Coronary artery disease   Hypothyroidism   Dementia ([place])   Chronic diastolic CHF (congestive heart failure) ([place])   COPD (chronic obstructive pulmonary disease) ([place])     Past Medical History:  Diagnosis Date  . Alzheimer disease ([place])   . Anxiety   . Arthritis   . Atrophic gastritis    a. By EGD 02/2013.  . Carotid artery disease ([place])    a. mild-mod plaque, <50% stenosis bilat by duplex 2018.  [name] Coronary atherosclerosis of native coronary artery    a. Multivessel s/p CABG 1996. b. s/p DES x 2 SVG to PDA 8/12 with distal disease managed medically.  . DDD (degenerative disc disease)    Chronic back pain   . Enlarged prostate   . Essential hypertension   . Hematuria   . Hypothyroidism   . LBBB (left bundle branch block)   . MI (myocardial infarction) ([place])   . Mixed hyperlipidemia   . OA (osteoarthritis)   . OSA (obstructive sleep apnea)   . Pneumonia due to COVID-19 virus    February 2021  . Sinus bradycardia    a. Aricept and BB discontinued due to this.    Past Surgical History:  Procedure Laterality Date  . COLONOSCOPY  08/03/2004   [place]-numerous large diverticula in the descending, transverse, descending, and sigmoid colon. Otherwise normal exam.  . COLONOSCOPY  07/12/2012   RMR: External hemorrhoidal tag; multiple rectal and colonic polyps removed and/or treated as described above. Pancolonic diverticulosis. Bx-tubular adenomas, rectal hyperplastic polyp. next colonoscopy in 06/2015.  [name] COLONOSCOPY N/A 06/22/2016   Procedure: COLONOSCOPY;  Surgeon: Aviva Signs, MD;  [facility];  Service: Gastroenterology;  Laterality: N/A;  730  . CORONARY ANGIOPLASTY WITH STENT PLACEMENT    . [place]  LIMA to LAD, SVG to D2, SVG to PDA, SVG to OM1 and OM2  . CORONARY STENT INTERVENTION N/A 12/10/2019   Procedure: CORONARY STENT INTERVENTION;  Surgeon: Burnell Blanks, MD;  Location: [facility];  Service: Cardiovascular;  Laterality: N/A;  . ESOPHAGOGASTRODUODENOSCOPY N/A 03/16/2013   Procedure: ESOPHAGOGASTRODUODENOSCOPY (EGD);  Surgeon: Daneil Dolin, MD;  [facility];  Service: Endoscopy;  Laterality: N/A;  12:00-moved to [place] notified pt  . ESOPHAGOGASTRODUODENOSCOPY N/A 03/06/2013   Procedure: ESOPHAGOGASTRODUODENOSCOPY (EGD);  Surgeon: Daneil Dolin, MD;  [facility];  Service: Endoscopy;  Laterality: N/A;  . HERNIA REPAIR    . INTRAVASCULAR ULTRASOUND/IVUS N/A 12/10/2019   Procedure: Intravascular Ultrasound/IVUS;  Surgeon: Burnell Blanks, MD;  Location: [facility];  Service:  Cardiovascular;  Laterality: N/A;  . LEFT HEART CATH AND CORS/GRAFTS ANGIOGRAPHY N/A 12/10/2019   Procedure: LEFT HEART CATH AND CORS/GRAFTS ANGIOGRAPHY;  Surgeon: Burnell Blanks, MD;  Location: [facility];  Service: Cardiovascular;  Laterality: N/A;      HPI  from the history and physical done on the day of admission:   Dustin [name]  is a 87 y.o. male who is a reformed smokerwith history of dementia, COPD,h/o prior covid 19 infection,  OSA, chronic pain syndrome/opiate dependence and BPH , as well as CAD/HFpEF/dCHF/HTN-----Multivessel CAD status post CABG and subsequent DES intervention - LHC on 12/10/2019 showed Severe triple vessel CAD s/p 5V CABG with 2/5 patent bypass grafts.  --Echo from 12/10/2019 with EF of 55 to 60 % with Grade 1 dCHF BNP is 153 and CXR w/o acute CHF findings  --Patient presents to the ED with confusional episodes worsening cough and hypoxia -I called and obtain additional history from patient's wife--wife was concerned about   patient's confusion and respiratory status, she did home Covid test which was negative --As per wife patient has had cough for over a week now, no vomiting no diarrhea -He has been taking very high-dose oxycodone at least 4 times a day- -in the ED is found to have an AKI with creatinine up to 1.75 from a baseline of 1 -Chest x-ray without acute finding and BNP was 153, troponins were flat -Patient and wife denies chest pain -Patient was found to be hypoxic requiring 2 to 3 L of oxygen via nasal cannula -CBC without leukocytosis remarkable for stable anemia     Hospital Course:    Brief Summary:- 87 y.o.malewho is a reformed smokerwith history of dementia, COPD,h/o prior covid 19 infection, OSA, chronic pain syndrome/opiate dependence and BPH , as well as CAD/HFpEF/dCHF/HTN-----Multivessel CAD status post CABG and subsequent DES intervention admitted on 02/10/2021 with increasing confusion/disorientation, and found  to have COPD exacerbation with acute hypoxic respiratory failure requiring supplemental oxygen and acute kidney injury  A/p 1)Acute COPD Exacerbation-patient is a reformed smoker,no definite pneumonia, - -hypoxia and dyspnea improved -Requiring oxygen via nasal cannula at 2 L/min Treated with IV Solu-Medrol , mucolytics, doxycyclineand bronchodilators as ordered, supplemental oxygen as ordered.  -Discharged on prednisone and doxycycline  2)Chronic Pain Syndrome---c/nhome opiate regimen includingoxycodone -PTA patient was on very high-dose opiates -Resume home opiates  3)Acute Hypoxic Resp Failure---due to # 1 above,manage as above #1  4)AKI--- Creatinine is up to 1.75 from baseline of 1.0 -Hold Lasix,  -It is down to 1.1 after hydration -renally adjust medications, avoid nephrotoxic agents / dehydration / hypotension  5)CAD/HFpEF/dCHF/HTN-----Multivessel CAD status post CABG and subsequent DES intervention- LHC on 12/10/2019 showedSevere  triple vessel CAD s/p 5V CABG with 2/5 patent bypass grafts. --Echo from 12/10/2019 with EF of 55 to 60 % with Grade 1 dCHF BNP is 153 and CXR w/o acute CHF findings -Troponin 28 >>30 -Continue ASA81 mgand Plavix for lifetime given the left main stenting. -Medications adjusted due to soft BP- continue crestorand Imdur  6)Acute Metabolic Encephalopthy---wife reports confusion, psychotic episodes and disorientation at home associated with increased coughing and shortness of breath---suspect partly opiate related , compounded by AKI and hypoxia -Mentation appears to be back to baseline   7)Hypothyroidism---continue levothyroxine  8)OSA (obstructive sleep apnea) -resume the use of CPAP QHS.  9)-GERD -Continue PPI  10)History of BPH -Continue finasteride and Flomax. -No urinary retention.  11)-Alzheimer's disease with depression/anxiety -Stable mood -Continue memantine,buspirone,Lexapro,Seroquel -Appears to be  back to baseline  12)Chronic Anemia---hemoglobin is above 11 which is close to present baseline -No evidence of ongoing bleeding  13) generalized weakness and deconditioning--PT recommends home health  14) steroid-induced hyperglycemia----should resolve with tapering of steroid   Disposition-discharge home with home health and home O2  Disposition: The patient is from: Home  Anticipated d/c is to: Home with HH    Code Status :  -  Code Status: Full Code   Family Communication-- Discussed with wife  Consults  :  na  Discharge Condition: stable  Follow UP   Follow-up Information    [name], MD Follow up in 11 day(s).   Specialty: Internal Medicine Why: 10:45 check in. 11:15 is appointment time.  Contact information: [address] [place] [address] [phone]        Satira Sark, MD .   Specialty: Cardiology Contact information: [place] [address] [phone]               Diet and Activity recommendation:  As advised  Discharge Instructions    Discharge Instructions    Call MD for:  difficulty breathing, headache or visual disturbances   Complete by: As directed    Call MD for:  persistant dizziness or light-headedness   Complete by: As directed    Call MD for:  persistant nausea and vomiting   Complete by: As directed    Call MD for:  temperature >100.4   Complete by: As directed    Diet - low sodium heart healthy   Complete by: As directed    Discharge instructions   Complete by: As directed    1)you need oxygen at home at 2 L via nasal cannula continuously while awake and while asleep--- smoking or having open fires around oxygen can cause fire, significant injury and death  2) your blood pressure is a bit on the low side so we have reduced your isosorbide/Imdur to 30 mg daily from 90 mg, we have also reduced your Lasix from 20 mg twice daily to 20 mg once a day  3)  please note several changes to your medications   Increase activity slowly   Complete by: As directed        Discharge Medications     Allergies as of 02/12/2021   No Known Allergies     Medication List    TAKE these medications   albuterol (2.5 MG/3ML) 0.083% nebulizer solution Commonly known as: PROVENTIL Take 3 mLs (2.5 mg total) by nebulization every 6 (six) hours as needed for wheezing or shortness of breath. Patient will also need the nebulizer machine with this prescription.   albuterol 108 (90 Base) MCG/ACT inhaler  Commonly known as: VENTOLIN HFA Inhale 2 puffs into the lungs every 6 (six) hours as needed for wheezing or shortness of breath.   alfuzosin 10 MG 24 hr tablet Commonly known as: UROXATRAL Take 1 tablet (10 mg total) by mouth daily with breakfast.   aspirin EC 81 MG tablet Take 81 mg by mouth daily.   budesonide-formoterol 160-4.5 MCG/ACT inhaler Commonly known as: Symbicort Inhale 2 puffs into the lungs 2 (two) times daily.   busPIRone 10 MG tablet Commonly known as: BUSPAR Take 10 mg by mouth 2 (two) times daily.   clopidogrel 75 MG tablet Commonly known as: PLAVIX TAKE 1 TABLET BY MOUTH DAILY.   doxazosin 1 MG tablet Commonly known as: CARDURA Take 1 mg by mouth at bedtime.   doxycycline 100 MG tablet Commonly known as: VIBRA-TABS Take 1 tablet (100 mg total) by mouth 2 (two) times daily for 5 days.   dutasteride 0.5 MG capsule Commonly known as: AVODART Take 0.5 mg by mouth. In the afternoon   escitalopram 20 MG tablet Commonly known as: LEXAPRO Take 20 mg by mouth every morning.   furosemide 20 MG tablet Commonly known as: LASIX Take 1 tablet (20 mg total) by mouth in the morning. What changed: when to take this   guaiFENesin 600 MG 12 hr tablet Commonly known as: MUCINEX Take 1 tablet (600 mg total) by mouth 2 (two) times daily.   hydrOXYzine 25 MG tablet Commonly known as: ATARAX/VISTARIL Take 25 mg by mouth 2 (two) times  daily as needed for anxiety.   isosorbide mononitrate 30 MG 24 hr tablet Commonly known as: IMDUR Take 1 tablet (30 mg total) by mouth daily. What changed: Another medication with the same name was removed. Continue taking this medication, and follow the directions you see here.   levothyroxine 50 MCG tablet Commonly known as: SYNTHROID Take 50 mcg by mouth every morning.   memantine 10 MG tablet Commonly known as: NAMENDA Take 10 mg by mouth 2 (two) times daily.   Movantik 25 MG Tabs tablet Generic drug: naloxegol oxalate Take 25 mg by mouth daily.   nitroGLYCERIN 0.4 MG SL tablet Commonly known as: NITROSTAT Place 1 tablet (0.4 mg total) under the tongue every 5 (five) minutes as needed for chest pain. Pt has bought nitro bottle from home and oxycodone 30 mg ( 3 or 4 tabs) is in nitro bottle per wife Dustin [name].   oxycodone 30 MG immediate release tablet Commonly known as: ROXICODONE Take 30 mg by mouth every 4 (four) hours as needed for pain. Take 1 tablet by mouth every 4 to 6 hours as needed for pain.  Max 8 /24 hours.   pantoprazole 40 MG tablet Commonly known as: PROTONIX Take 1 tablet (40 mg total) by mouth every morning.   potassium chloride SA 20 MEQ tablet Commonly known as: KLOR-CON Take 20 mEq by mouth daily.   prednisoLONE acetate 1 % ophthalmic suspension Commonly known as: PRED FORTE Place 1 drop into the right eye 4 (four) times daily.   predniSONE 20 MG tablet Commonly known as: DELTASONE Take 2 tablets (40 mg total) by mouth daily with breakfast for 5 days.   Prolensa 0.07 % Soln Generic drug: Bromfenac Sodium Place 1 drop into the right eye 4 (four) times daily.   QUEtiapine 25 MG tablet Commonly known as: SEROQUEL Take 25 mg by mouth at bedtime.   rosuvastatin 40 MG tablet Commonly known as: CRESTOR Take 1 tablet (40 mg total) by  mouth daily at 6 PM.   tamsulosin 0.4 MG Caps capsule Commonly known as: FLOMAX Take 1 capsule (0.4 mg  total) by mouth daily. To help you pee.   Vitamin D (Ergocalciferol) 1.25 MG (50000 UNIT) Caps capsule Commonly known as: DRISDOL Take 50,000 Units by mouth once a week.   zinc sulfate 220 (50 Zn) MG capsule Take 1 capsule (220 mg total) by mouth daily.            Durable Medical Equipment  (From admission, onward)         Start     Ordered   02/12/21 1140  For home use only DME oxygen  Once       Comments: SATURATION QUALIFICATIONS: (This note is used to comply with regulatory documentation for home oxygen)   Patient Saturations on Room Air at Rest = 86%   Patient Saturations on Room Air while Ambulating = 84 %   Patient Saturations on 2 Liters of oxygen while Ambulating = 93 %       Patient needs continuous O2 at 2 L/min continuously via nasal cannula with humidifier, with gaseous portability and conserving device    Dx--COPD  Question Answer Comment  Length of Need Lifetime   Mode or (Route) Nasal cannula   Liters per Minute 2   Frequency Continuous (stationary and portable oxygen unit needed)   Oxygen conserving device Yes   Oxygen delivery system Gas      02/12/21 1139          Major procedures and Radiology Reports - PLEASE review detailed and final reports for all details, in brief -   DG Chest Port 1 View  Result Date: 02/10/2021 CLINICAL DATA:  Shortness of breath and increasing chest pain over the past few days. EXAM: PORTABLE CHEST 1 VIEW COMPARISON:  Single-view of the chest and CT chest 09/21/2020. FINDINGS: The lungs are emphysematous but clear. The patient is status post CABG. Heart size is upper normal. No pneumothorax or pleural effusion. Aortic atherosclerosis. No acute or focal bony abnormality. IMPRESSION: No acute disease. Aortic Atherosclerosis (ICD10-I70.0) and Emphysema (ICD10-J43.9). Electronically Signed   By: Inge Rise M.D.   On: 02/10/2021 10:56    Micro Results   Recent Results (from the past 240 hour(s))  Resp Panel by  RT-PCR (Flu A&B, Covid) Nasopharyngeal Swab     Status: None   Collection Time: 02/10/21  9:52 AM   Specimen: Nasopharyngeal Swab; Nasopharyngeal(NP) swabs in vial transport medium  Result Value Ref Range Status   SARS Coronavirus 2 by RT PCR NEGATIVE NEGATIVE Final    Comment: (NOTE) SARS-CoV-2 target nucleic acids are NOT DETECTED.  The SARS-CoV-2 RNA is generally detectable in upper respiratory specimens during the acute phase of infection. The lowest concentration of SARS-CoV-2 viral copies this assay can detect is 138 copies/mL. A negative result does not preclude SARS-Cov-2 infection and should not be used as the sole basis for treatment or other patient management decisions. A negative result may occur with  improper specimen collection/handling, submission of specimen other than nasopharyngeal swab, presence of viral mutation(s) within the areas targeted by this assay, and inadequate number of viral copies(<138 copies/mL). A negative result must be combined with clinical observations, patient history, and epidemiological information. The expected result is Negative.  Fact Sheet for Patients:  EntrepreneurPulse.com.au  Fact Sheet for Healthcare Providers:  IncredibleEmployment.be  This test is no t yet approved or cleared by the [place] and  has been authorized  for detection and/or diagnosis of SARS-CoV-2 by FDA under an Emergency Use Authorization (EUA). This EUA will remain  in effect (meaning this test can be used) for the duration of the COVID-19 declaration under Section 564(b)(1) of the Act, 21 U.S.C.section 360bbb-3(b)(1), unless the authorization is terminated  or revoked sooner.       Influenza A by PCR NEGATIVE NEGATIVE Final   Influenza B by PCR NEGATIVE NEGATIVE Final    Comment: (NOTE) The Xpert Xpress SARS-CoV-2/FLU/RSV plus assay is intended as an aid in the diagnosis of influenza from Nasopharyngeal swab  specimens and should not be used as a sole basis for treatment. Nasal washings and aspirates are unacceptable for Xpert Xpress SARS-CoV-2/FLU/RSV testing.  Fact Sheet for Patients: EntrepreneurPulse.com.au  Fact Sheet for Healthcare Providers: IncredibleEmployment.be  This test is not yet approved or cleared by the [place] FDA and has been authorized for detection and/or diagnosis of SARS-CoV-2 by FDA under an Emergency Use Authorization (EUA). This EUA will remain in effect (meaning this test can be used) for the duration of the COVID-19 declaration under Section 564(b)(1) of the Act, 21 U.S.C. section 360bbb-3(b)(1), unless the authorization is terminated or revoked.  Performed at [facility], [address], [place], [place] [address]   Urine Culture     Status: None   Collection Time: 02/10/21 12:13 PM   Specimen: Urine, Clean Catch  Result Value Ref Range Status   Specimen Description   Final    URINE, CLEAN CATCH Performed at [facility], [address], [place], [place] [address]    Special Requests   Final    Normal Performed at [facility], [address], [place], [place] [address]    Culture   Final    NO GROWTH Performed at [facility], [place] [address] [place], [place] [address]    Report Status 02/12/2021 FINAL  Final   Today   Subjective    Dustin [name] today has no new complaints No fever  Or chills   No Nausea, Vomiting or Diarrhea       Patient has been seen and examined prior to discharge   Objective   Blood pressure 108/90, pulse 65, temperature 98.7 F (37.1 C), resp. rate 17, height 5\' 11"  (1.803 m), weight 96.1 kg, SpO2 97 %.   Intake/Output Summary (Last 24 hours) at 02/12/2021 1517 Last data filed at 02/12/2021 0900 Gross per 24 hour  Intake 480 ml  Output 1090 ml  Net -610 ml    Exam Gen:- Awake Alert, dyspnea on exertion persist HEENT:- [place].AT, No sclera icterus Neck-Supple  Neck,No JVD,.  Nose- [place] 2L/min Lungs-improved air movement, no wheeze CV- S1, S2 normal, regular , prior CABG scar Abd-  +ve B.Sounds, Abd Soft, No tenderness,    Extremity/Skin:- No  edema, pedal pulses present  Psych-affect is appropriate, more coherent  neuro-generalized weakness ,no new focal deficits, no tremors   Data Review   CBC w Diff:  Lab Results  Component Value Date   WBC 10.3 02/12/2021   HGB 11.0 (L) 02/12/2021   HCT 34.8 (L) 02/12/2021   HCT 43 01/26/2013   PLT 159 02/12/2021   LYMPHOPCT 14 02/10/2021   MONOPCT 9 02/10/2021   EOSPCT 1 02/10/2021   BASOPCT 0 02/10/2021    CMP:  Lab Results  Component Value Date   NA 138 02/12/2021   NA 139 01/26/2013   K 4.0 02/12/2021   K 3.9 01/26/2013   CL 103 02/12/2021  CL 100 01/26/2013   CO2 30 02/12/2021   CO2 31 01/26/2013   BUN 26 (H) 02/12/2021   BUN 14 01/26/2013   CREATININE 1.03 02/12/2021   CREATININE 0.80 01/26/2013   PROT 6.9 09/21/2020   PROT 6.7 01/26/2013   ALBUMIN 3.0 (L) 02/12/2021   ALBUMIN 4.6 01/26/2013   BILITOT 1.0 09/21/2020   BILITOT 0.8 01/26/2013   ALKPHOS 53 09/21/2020   ALKPHOS 70 01/26/2013   AST 24 09/21/2020   AST 16 01/26/2013   ALT 22 09/21/2020  .   Total Discharge time is about 33 minutes  Roxan Hockey M.D on 02/12/2021 at 3:17 PM  Go to [email or web address] -  for contact info  [name] - Office  [phone]

## 2021-02-13 DIAGNOSIS — N179 Acute kidney failure, unspecified: Secondary | ICD-10-CM | POA: Diagnosis not present

## 2021-02-13 DIAGNOSIS — G9341 Metabolic encephalopathy: Secondary | ICD-10-CM | POA: Diagnosis not present

## 2021-02-13 DIAGNOSIS — U071 COVID-19: Secondary | ICD-10-CM | POA: Diagnosis not present

## 2021-02-13 DIAGNOSIS — J1282 Pneumonia due to coronavirus disease 2019: Secondary | ICD-10-CM | POA: Diagnosis not present

## 2021-02-14 DIAGNOSIS — G309 Alzheimer's disease, unspecified: Secondary | ICD-10-CM | POA: Diagnosis not present

## 2021-02-14 DIAGNOSIS — G4733 Obstructive sleep apnea (adult) (pediatric): Secondary | ICD-10-CM | POA: Diagnosis not present

## 2021-02-14 DIAGNOSIS — F0280 Dementia in other diseases classified elsewhere without behavioral disturbance: Secondary | ICD-10-CM | POA: Diagnosis not present

## 2021-02-14 DIAGNOSIS — J441 Chronic obstructive pulmonary disease with (acute) exacerbation: Secondary | ICD-10-CM | POA: Diagnosis not present

## 2021-02-14 DIAGNOSIS — F32A Depression, unspecified: Secondary | ICD-10-CM | POA: Diagnosis not present

## 2021-02-14 DIAGNOSIS — F419 Anxiety disorder, unspecified: Secondary | ICD-10-CM | POA: Diagnosis not present

## 2021-02-14 DIAGNOSIS — G894 Chronic pain syndrome: Secondary | ICD-10-CM | POA: Diagnosis not present

## 2021-02-14 DIAGNOSIS — J9601 Acute respiratory failure with hypoxia: Secondary | ICD-10-CM | POA: Diagnosis not present

## 2021-02-14 DIAGNOSIS — I110 Hypertensive heart disease with heart failure: Secondary | ICD-10-CM | POA: Diagnosis not present

## 2021-02-14 DIAGNOSIS — I2510 Atherosclerotic heart disease of native coronary artery without angina pectoris: Secondary | ICD-10-CM | POA: Diagnosis not present

## 2021-02-14 DIAGNOSIS — G9341 Metabolic encephalopathy: Secondary | ICD-10-CM | POA: Diagnosis not present

## 2021-02-14 DIAGNOSIS — I5032 Chronic diastolic (congestive) heart failure: Secondary | ICD-10-CM | POA: Diagnosis not present

## 2021-02-14 DIAGNOSIS — N179 Acute kidney failure, unspecified: Secondary | ICD-10-CM | POA: Diagnosis not present

## 2021-02-18 DIAGNOSIS — E063 Autoimmune thyroiditis: Secondary | ICD-10-CM | POA: Diagnosis not present

## 2021-02-18 DIAGNOSIS — J441 Chronic obstructive pulmonary disease with (acute) exacerbation: Secondary | ICD-10-CM | POA: Diagnosis not present

## 2021-02-18 DIAGNOSIS — G894 Chronic pain syndrome: Secondary | ICD-10-CM | POA: Diagnosis not present

## 2021-02-18 DIAGNOSIS — I10 Essential (primary) hypertension: Secondary | ICD-10-CM | POA: Diagnosis not present

## 2021-02-18 DIAGNOSIS — Z6829 Body mass index (BMI) 29.0-29.9, adult: Secondary | ICD-10-CM | POA: Diagnosis not present

## 2021-02-18 DIAGNOSIS — Z1331 Encounter for screening for depression: Secondary | ICD-10-CM | POA: Diagnosis not present

## 2021-02-18 DIAGNOSIS — I209 Angina pectoris, unspecified: Secondary | ICD-10-CM | POA: Diagnosis not present

## 2021-02-23 DIAGNOSIS — J449 Chronic obstructive pulmonary disease, unspecified: Secondary | ICD-10-CM | POA: Diagnosis not present

## 2021-02-23 DIAGNOSIS — I10 Essential (primary) hypertension: Secondary | ICD-10-CM | POA: Diagnosis not present

## 2021-02-23 DIAGNOSIS — F0280 Dementia in other diseases classified elsewhere without behavioral disturbance: Secondary | ICD-10-CM | POA: Diagnosis not present

## 2021-02-23 DIAGNOSIS — G309 Alzheimer's disease, unspecified: Secondary | ICD-10-CM | POA: Diagnosis not present

## 2021-02-23 DIAGNOSIS — E7849 Other hyperlipidemia: Secondary | ICD-10-CM | POA: Diagnosis not present

## 2021-02-24 ENCOUNTER — Telehealth: Payer: Self-pay | Admitting: Cardiology

## 2021-02-24 DIAGNOSIS — G894 Chronic pain syndrome: Secondary | ICD-10-CM | POA: Diagnosis not present

## 2021-02-24 DIAGNOSIS — I110 Hypertensive heart disease with heart failure: Secondary | ICD-10-CM | POA: Diagnosis not present

## 2021-02-24 DIAGNOSIS — J441 Chronic obstructive pulmonary disease with (acute) exacerbation: Secondary | ICD-10-CM | POA: Diagnosis not present

## 2021-02-24 DIAGNOSIS — I2510 Atherosclerotic heart disease of native coronary artery without angina pectoris: Secondary | ICD-10-CM | POA: Diagnosis not present

## 2021-02-24 DIAGNOSIS — F32A Depression, unspecified: Secondary | ICD-10-CM | POA: Diagnosis not present

## 2021-02-24 DIAGNOSIS — I5032 Chronic diastolic (congestive) heart failure: Secondary | ICD-10-CM | POA: Diagnosis not present

## 2021-02-24 DIAGNOSIS — G9341 Metabolic encephalopathy: Secondary | ICD-10-CM | POA: Diagnosis not present

## 2021-02-24 DIAGNOSIS — J9601 Acute respiratory failure with hypoxia: Secondary | ICD-10-CM | POA: Diagnosis not present

## 2021-02-24 DIAGNOSIS — F0280 Dementia in other diseases classified elsewhere without behavioral disturbance: Secondary | ICD-10-CM | POA: Diagnosis not present

## 2021-02-24 DIAGNOSIS — G4733 Obstructive sleep apnea (adult) (pediatric): Secondary | ICD-10-CM | POA: Diagnosis not present

## 2021-02-24 DIAGNOSIS — N179 Acute kidney failure, unspecified: Secondary | ICD-10-CM | POA: Diagnosis not present

## 2021-02-24 DIAGNOSIS — F419 Anxiety disorder, unspecified: Secondary | ICD-10-CM | POA: Diagnosis not present

## 2021-02-24 DIAGNOSIS — G309 Alzheimer's disease, unspecified: Secondary | ICD-10-CM | POA: Diagnosis not present

## 2021-02-24 NOTE — Telephone Encounter (Signed)
Thank you for the update.  Would plan on getting an ECG at his follow-up and review medications to see if further adjustments can be made.  He has generally preferred conservative management.

## 2021-02-24 NOTE — Telephone Encounter (Signed)
Janett Billow from [place] called to let Dr. Domenic Polite know that the patient has been having chest pains more often. He has been using his nitroGLYCERIN tablets. He had an episode just this morning and took 3 of them. She is concerned that [name] may not inform the doctor. She can be reached at [phone].

## 2021-02-24 NOTE — H&P (View-Only) (Signed)
Cardiology Office Note:    Date:  02/25/2021   ID:  Dustin [name], DOB 1934-05-29, MRN [id number]  PCP:  [name], [facility]  Cardiologist:  Rozann Lesches, MD   Advanced Practice Provider:  None  Electrophysiologist:  None       Referring MD: [name], MD   Chief Complaint:  Chest Pain    Patient Profile:    Dustin [name] is a 87 y.o. male with:   Coronary artery disease w/ angina   S/p CABG in 1996  S/p DES x 2 to S-PDA in 8/12; distal dz managed medically hypertension   S/p DES to distal LM in 2019-12-23; 2/5 grafts patent   Hyperlipidemia  Hypothyroidism   LBBB   OSA  Sinus bradycardia; Aricept + beta-blocker DCd  Atrophic gastritis   Aortic atherosclerosis   Prior CV studies: Cardiac catheterization 2019-12-23 LM ost 90 LAD mid 99 LCx prox 100 - CTO RCA mid 100 -CTO S-RPDA 100 S-D2 100 S-OM1/OM2 - OM1 limb patent; OM2 limb 100 L-LAD patent  PCI: 4.5 x 18 mm Resolute Onyx DES to LM    Echocardiogram 12/10/2019 EF 55-60, Gr 1 DD, no RWMA, normal RVSF, mild BAE, mild MR, trivial TR, AV sclerosis without stenosis   Carotid US 02/2017 Bilat ICA < 50   History of Present Illness:    Dustin [name] was last seen by Dr. Domenic Polite in 7/21.  He was admitted in 01/2021 with a COPD exacerbation.  He did call in recently with increasing symptoms of angina requiring SL NTG use (3-4 tablets taken 02/24/21).  He is seen for evaluation.  He is here with his wife.  He has chronic anginal symptoms.  He had worsening symptoms yesterday and had to take 3 nitroglycerin.  He typically has left-sided and substernal chest discomfort.  He describes it as sharp and heavy.  He has not had radiating symptoms.  He has shortness of breath with minimal exertion.  He does note worsening stamina over the past several months.  He is unable to walk through a grocery store without stopping.  He has not had syncope, significant leg edema.  He does  sleep in a recliner and has done so chronically.  He has occasional right leg pain.  This does not seem to be related to exertion.  He also notes abdominal swelling related to a ventral hernia  Past Medical History:  Diagnosis Date  . Alzheimer disease ([place])   . Anxiety   . Arthritis   . Atrophic gastritis    a. By EGD 02/2013.  . Carotid artery disease ([place])    a. mild-mod plaque, <50% stenosis bilat by duplex 2018.  [name] Coronary atherosclerosis of native coronary artery    a. Multivessel s/p CABG 1996. b. s/p DES x 2 SVG to PDA 8/12 with distal disease managed medically.  . DDD (degenerative disc disease)    Chronic back pain  . Enlarged prostate   . Essential hypertension   . Hematuria   . Hypothyroidism   . LBBB (left bundle branch block)   . MI (myocardial infarction) ([place])   . Mixed hyperlipidemia   . OA (osteoarthritis)   . OSA (obstructive sleep apnea)   . Pneumonia due to COVID-19 virus    February 2021  . Sinus bradycardia    a. Aricept and BB discontinued due to this.    Current Medications: Current Meds  Medication Sig  . albuterol (PROVENTIL) (2.5 MG/3ML)  0.083% nebulizer solution Take 3 mLs (2.5 mg total) by nebulization every 6 (six) hours as needed for wheezing or shortness of breath. Patient will also need the nebulizer machine with this prescription.  [name] albuterol (VENTOLIN HFA) 108 (90 Base) MCG/ACT inhaler Inhale 2 puffs into the lungs every 6 (six) hours as needed for wheezing or shortness of breath.  . alfuzosin (UROXATRAL) 10 MG 24 hr tablet Take 1 tablet (10 mg total) by mouth daily with breakfast.  . aspirin EC 81 MG tablet Take 81 mg by mouth daily.  . budesonide-formoterol (SYMBICORT) 160-4.5 MCG/ACT inhaler Inhale 2 puffs into the lungs 2 (two) times daily.  . busPIRone (BUSPAR) 10 MG tablet Take 10 mg by mouth 2 (two) times daily.  . clopidogrel (PLAVIX) 75 MG tablet TAKE 1 TABLET BY MOUTH DAILY.  [name] doxazosin (CARDURA) 1 MG tablet Take 1 mg by mouth at  bedtime.  . dutasteride (AVODART) 0.5 MG capsule Take 0.5 mg by mouth. In the afternoon  . escitalopram (LEXAPRO) 20 MG tablet Take 20 mg by mouth every morning.   . furosemide (LASIX) 20 MG tablet Take 1 tablet (20 mg total) by mouth in the morning.  [name] guaiFENesin (MUCINEX) 600 MG 12 hr tablet Take 1 tablet (600 mg total) by mouth 2 (two) times daily.  . hydrOXYzine (ATARAX/VISTARIL) 25 MG tablet Take 25 mg by mouth 2 (two) times daily as needed for anxiety.  . isosorbide mononitrate (IMDUR) 30 MG 24 hr tablet Take 1 tablet (30 mg total) by mouth daily. (Patient taking differently: Take 90 mg by mouth daily.)  . levothyroxine (SYNTHROID, LEVOTHROID) 50 MCG tablet Take 50 mcg by mouth every morning.  . memantine (NAMENDA) 10 MG tablet Take 10 mg by mouth 2 (two) times daily.   [name] MOVANTIK 25 MG TABS tablet Take 25 mg by mouth daily.  . nitroGLYCERIN (NITROSTAT) 0.4 MG SL tablet Place 1 tablet (0.4 mg total) under the tongue every 5 (five) minutes as needed for chest pain. Pt has bought nitro bottle from home and oxycodone 30 mg ( 3 or 4 tabs) is in nitro bottle per wife Jerime Arif.  [name] oxycodone (ROXICODONE) 30 MG immediate release tablet Take 30 mg by mouth every 4 (four) hours as needed for pain. Take 1 tablet by mouth every 4 to 6 hours as needed for pain.  Max 8 /24 hours.  . pantoprazole (PROTONIX) 40 MG tablet Take 1 tablet (40 mg total) by mouth every morning.  . potassium chloride SA (K-DUR,KLOR-CON) 20 MEQ tablet Take 20 mEq by mouth daily.   . QUEtiapine (SEROQUEL) 25 MG tablet Take 25 mg by mouth at bedtime.  . rosuvastatin (CRESTOR) 40 MG tablet Take 1 tablet (40 mg total) by mouth daily at 6 PM.  . tamsulosin (FLOMAX) 0.4 MG CAPS capsule Take 1 capsule (0.4 mg total) by mouth daily. To help you pee.  . Vitamin D, Ergocalciferol, (DRISDOL) 1.25 MG (50000 UNIT) CAPS capsule Take 50,000 Units by mouth once a week.  . zinc sulfate 220 (50 Zn) MG capsule Take 1 capsule (220 mg total) by  mouth daily.     Allergies:   Patient has no known allergies.   Social History   Tobacco Use  . Smoking status: Former Smoker    Packs/day: 2.00    Years: 40.00    Pack years: 80.00    Types: Cigarettes    Quit date: 12/27/1994    Years since quitting: 26.1  . Smokeless tobacco: Never Used  Vaping Use  . Vaping Use: Never used  Substance Use Topics  . Alcohol use: No    Alcohol/week: 0.0 standard drinks  . Drug use: No     Family Hx: The patient's family history includes Heart attack in his mother; Heart disease in an other family member. There is no history of Colon cancer.  ROS   EKGs/Labs/Other Test Reviewed:    EKG:  EKG is   ordered today.  The ekg ordered today demonstrates normal sinus rhythm, heart rate 65, normal axis, downsloping T wave inversions 2, 3, aVF, V4-V6, QTC 440, PVC  Recent Labs: 09/21/2020: ALT 22 02/10/2021: B Natriuretic Peptide 153.0 02/12/2021: BUN 26; Creatinine, Ser 1.03; Hemoglobin 11.0; Platelets 159; Potassium 4.0; Sodium 138   Recent Lipid Panel Lab Results  Component Value Date/Time   CHOL 143 12/11/2019 04:38 AM   TRIG 83 02/13/2020 02:29 AM   HDL 37 (L) 12/11/2019 04:38 AM   CHOLHDL 3.9 12/11/2019 04:38 AM   LDLCALC 77 12/11/2019 04:38 AM      Risk Assessment/Calculations:      Physical Exam:    VS:  BP (!) 110/58   Pulse 73   Ht 5\' 10"  (1.778 m)   Wt 214 lb (97.1 kg)   SpO2 95%   BMI 30.71 kg/m     Wt Readings from Last 3 Encounters:  02/25/21 214 lb (97.1 kg)  02/10/21 211 lb 13.8 oz (96.1 kg)  10/24/20 206 lb (93.4 kg)     Constitutional:      Appearance: Healthy appearance. Not in distress.  Neck:     Vascular: No JVR. JVD normal.  Pulmonary:     Effort: Pulmonary effort is normal.     Breath sounds: No wheezing. No rales.  Cardiovascular:     Normal rate. Regular rhythm. Normal S1. Normal S2.     Murmurs: There is no murmur.  Edema:    Peripheral edema absent.  Abdominal:     General: There is  distension.     Palpations: Abdomen is soft.  Skin:    General: Skin is warm and dry.  Neurological:     General: No focal deficit present.     Mental Status: Alert and oriented to person, place and time.     Cranial Nerves: Cranial nerves are intact.       ASSESSMENT & PLAN:    1. Coronary artery disease involving native coronary artery of native heart with unstable angina pectoris [facility]) History of prior CABG in 1996 and drug-eluting stent x2 to the vein graft to the PDA in 2012.  His last intervention was in December 2020 and he underwent a DES to the distal left main.  3 of his 5 grafts were occluded at that time.  He has had fairly chronic angina.  However, over the last several months, he has noted decreased exercise tolerance and reduced stamina.  He had an episode of chest discomfort yesterday requiring 3 nitroglycerin.  His electrocardiogram demonstrates downsloping inferior lateral T wave inversions.  These seem to be new.  I have recommended cardiac catheterization.  The patient is agreeable to this.  I reviewed his case with Dr. Domenic Polite.  He agrees.  The patient cannot take beta-blockers due to history of bradycardia.  His blood pressure will not tolerate further titration of his nitrates.    -Continue aspirin, clopidogrel, isosorbide, rosuvastatin.  -Proceed with cardiac catheterization later this week or early next week  -Follow-up 2 weeks post catheterization  -The patient  does go the emergency room if he has worsening symptoms.  2. Essential hypertension The patient's blood pressure is controlled on his current regimen.  Continue current therapy.   3. Mixed hyperlipidemia Continue statin therapy  4. Right leg pain He seems to have more symptoms at rest.  He does not really describe classic claudication.  We can certainly consider ABIs at his follow-up appointment.  5. Abdominal distention He notes a history of large ventral hernia.  He does not seem to think his abdomen  is any bigger.  He did have an ultrasound years ago that showed fatty liver.  It may be worthwhile obtaining an abdominal ultrasound at some point to assess for ascites.   Shared Decision Making/Informed Consent The risks [stroke (1 in 1000), death (1 in 1000), kidney failure [usually temporary] (1 in 500), bleeding (1 in 200), allergic reaction [possibly serious] (1 in 200)], benefits (diagnostic support and management of coronary artery disease) and alternatives of a cardiac catheterization were discussed in detail with Mr. Sarli and he is willing to proceed.   Dispo:  Return in about 3 weeks (around 03/18/2021) for Ermalinda Barrios, PA.   Medication Adjustments/Labs and Tests Ordered: Current medicines are reviewed at length with the patient today.  Concerns regarding medicines are outlined above.  Tests Ordered: Orders Placed This Encounter  Procedures  . Basic Metabolic Panel (BMET)  . CBC  . EKG 12-Lead   Medication Changes: No orders of the defined types were placed in this encounter.   Signed, Richardson Dopp, PA-C  02/25/2021 5:23 PM    [facility] [address], [place], [place]  [address] Phone: [phone]; Fax: [phone]

## 2021-02-24 NOTE — Telephone Encounter (Signed)
Patient has an appointment with Kathleen Argue on 02/25/2021. Patient's aide has contacted our office to let us know that patient has taken 3-4 nitroglycerin today. Patient c/o CP around his heart, but denies SOB. Patient's aide voiced concerns that he was pale and sweaty today. BP was 110/60 and O2 was in the normal range. Patient refused to go to the ED or call 911. I will FYI Dr. Domenic Polite.

## 2021-02-24 NOTE — Progress Notes (Signed)
Cardiology Office Note:    Date:  02/25/2021   ID:  Dustin [name], DOB March 08, 1934, MRN [id number]  PCP:  [name], [facility]  Cardiologist:  Rozann Lesches, MD   Advanced Practice Provider:  None  Electrophysiologist:  None       Referring MD: [name], MD   Chief Complaint:  Chest Pain    Patient Profile:    Dustin [name] is a 87 y.o. male with:   Coronary artery disease w/ angina   S/p CABG in 1996  S/p DES x 2 to S-PDA in 8/12; distal dz managed medically hypertension   S/p DES to distal LM in 01/15/2020; 2/5 grafts patent   Hyperlipidemia  Hypothyroidism   LBBB   OSA  Sinus bradycardia; Aricept + beta-blocker DCd  Atrophic gastritis   Aortic atherosclerosis   Prior CV studies: Cardiac catheterization 01-15-2020 LM ost 90 LAD mid 99 LCx prox 100 - CTO RCA mid 100 -CTO S-RPDA 100 S-D2 100 S-OM1/OM2 - OM1 limb patent; OM2 limb 100 L-LAD patent  PCI: 4.5 x 18 mm Resolute Onyx DES to LM    Echocardiogram 12/10/2019 EF 55-60, Gr 1 DD, no RWMA, normal RVSF, mild BAE, mild MR, trivial TR, AV sclerosis without stenosis   Carotid US 02/2017 Bilat ICA < 50   History of Present Illness:    Dustin [name] was last seen by Dr. Domenic Polite in 7/21.  He was admitted in 01/2021 with a COPD exacerbation.  He did call in recently with increasing symptoms of angina requiring SL NTG use (3-4 tablets taken 02/24/21).  He is seen for evaluation.  He is here with his wife.  He has chronic anginal symptoms.  He had worsening symptoms yesterday and had to take 3 nitroglycerin.  He typically has left-sided and substernal chest discomfort.  He describes it as sharp and heavy.  He has not had radiating symptoms.  He has shortness of breath with minimal exertion.  He does note worsening stamina over the past several months.  He is unable to walk through a grocery store without stopping.  He has not had syncope, significant leg edema.  He does  sleep in a recliner and has done so chronically.  He has occasional right leg pain.  This does not seem to be related to exertion.  He also notes abdominal swelling related to a ventral hernia  Past Medical History:  Diagnosis Date  . Alzheimer disease ([place])   . Anxiety   . Arthritis   . Atrophic gastritis    a. By EGD 02/2013.  . Carotid artery disease ([place])    a. mild-mod plaque, <50% stenosis bilat by duplex 2018.  [name] Coronary atherosclerosis of native coronary artery    a. Multivessel s/p CABG 1996. b. s/p DES x 2 SVG to PDA 8/12 with distal disease managed medically.  . DDD (degenerative disc disease)    Chronic back pain  . Enlarged prostate   . Essential hypertension   . Hematuria   . Hypothyroidism   . LBBB (left bundle branch block)   . MI (myocardial infarction) ([place])   . Mixed hyperlipidemia   . OA (osteoarthritis)   . OSA (obstructive sleep apnea)   . Pneumonia due to COVID-19 virus    February 2021  . Sinus bradycardia    a. Aricept and BB discontinued due to this.    Current Medications: Current Meds  Medication Sig  . albuterol (PROVENTIL) (2.5 MG/3ML)  0.083% nebulizer solution Take 3 mLs (2.5 mg total) by nebulization every 6 (six) hours as needed for wheezing or shortness of breath. Patient will also need the nebulizer machine with this prescription.  [name] albuterol (VENTOLIN HFA) 108 (90 Base) MCG/ACT inhaler Inhale 2 puffs into the lungs every 6 (six) hours as needed for wheezing or shortness of breath.  . alfuzosin (UROXATRAL) 10 MG 24 hr tablet Take 1 tablet (10 mg total) by mouth daily with breakfast.  . aspirin EC 81 MG tablet Take 81 mg by mouth daily.  . budesonide-formoterol (SYMBICORT) 160-4.5 MCG/ACT inhaler Inhale 2 puffs into the lungs 2 (two) times daily.  . busPIRone (BUSPAR) 10 MG tablet Take 10 mg by mouth 2 (two) times daily.  . clopidogrel (PLAVIX) 75 MG tablet TAKE 1 TABLET BY MOUTH DAILY.  [name] doxazosin (CARDURA) 1 MG tablet Take 1 mg by mouth at  bedtime.  . dutasteride (AVODART) 0.5 MG capsule Take 0.5 mg by mouth. In the afternoon  . escitalopram (LEXAPRO) 20 MG tablet Take 20 mg by mouth every morning.   . furosemide (LASIX) 20 MG tablet Take 1 tablet (20 mg total) by mouth in the morning.  [name] guaiFENesin (MUCINEX) 600 MG 12 hr tablet Take 1 tablet (600 mg total) by mouth 2 (two) times daily.  . hydrOXYzine (ATARAX/VISTARIL) 25 MG tablet Take 25 mg by mouth 2 (two) times daily as needed for anxiety.  . isosorbide mononitrate (IMDUR) 30 MG 24 hr tablet Take 1 tablet (30 mg total) by mouth daily. (Patient taking differently: Take 90 mg by mouth daily.)  . levothyroxine (SYNTHROID, LEVOTHROID) 50 MCG tablet Take 50 mcg by mouth every morning.  . memantine (NAMENDA) 10 MG tablet Take 10 mg by mouth 2 (two) times daily.   [name] MOVANTIK 25 MG TABS tablet Take 25 mg by mouth daily.  . nitroGLYCERIN (NITROSTAT) 0.4 MG SL tablet Place 1 tablet (0.4 mg total) under the tongue every 5 (five) minutes as needed for chest pain. Pt has bought nitro bottle from home and oxycodone 30 mg ( 3 or 4 tabs) is in nitro bottle per wife Dustin [name].  [name] oxycodone (ROXICODONE) 30 MG immediate release tablet Take 30 mg by mouth every 4 (four) hours as needed for pain. Take 1 tablet by mouth every 4 to 6 hours as needed for pain.  Max 8 /24 hours.  . pantoprazole (PROTONIX) 40 MG tablet Take 1 tablet (40 mg total) by mouth every morning.  . potassium chloride SA (K-DUR,KLOR-CON) 20 MEQ tablet Take 20 mEq by mouth daily.   . QUEtiapine (SEROQUEL) 25 MG tablet Take 25 mg by mouth at bedtime.  . rosuvastatin (CRESTOR) 40 MG tablet Take 1 tablet (40 mg total) by mouth daily at 6 PM.  . tamsulosin (FLOMAX) 0.4 MG CAPS capsule Take 1 capsule (0.4 mg total) by mouth daily. To help you pee.  . Vitamin D, Ergocalciferol, (DRISDOL) 1.25 MG (50000 UNIT) CAPS capsule Take 50,000 Units by mouth once a week.  . zinc sulfate 220 (50 Zn) MG capsule Take 1 capsule (220 mg total) by  mouth daily.     Allergies:   Patient has no known allergies.   Social History   Tobacco Use  . Smoking status: Former Smoker    Packs/day: 2.00    Years: 40.00    Pack years: 80.00    Types: Cigarettes    Quit date: 12/27/1994    Years since quitting: 26.1  . Smokeless tobacco: Never Used  Vaping Use  . Vaping Use: Never used  Substance Use Topics  . Alcohol use: No    Alcohol/week: 0.0 standard drinks  . Drug use: No     Family Hx: The patient's family history includes Heart attack in his mother; Heart disease in an other family member. There is no history of Colon cancer.  ROS   EKGs/Labs/Other Test Reviewed:    EKG:  EKG is   ordered today.  The ekg ordered today demonstrates normal sinus rhythm, heart rate 65, normal axis, downsloping T wave inversions 2, 3, aVF, V4-V6, QTC 440, PVC  Recent Labs: 09/21/2020: ALT 22 02/10/2021: B Natriuretic Peptide 153.0 02/12/2021: BUN 26; Creatinine, Ser 1.03; Hemoglobin 11.0; Platelets 159; Potassium 4.0; Sodium 138   Recent Lipid Panel Lab Results  Component Value Date/Time   CHOL 143 12/11/2019 04:38 AM   TRIG 83 02/13/2020 02:29 AM   HDL 37 (L) 12/11/2019 04:38 AM   CHOLHDL 3.9 12/11/2019 04:38 AM   LDLCALC 77 12/11/2019 04:38 AM      Risk Assessment/Calculations:      Physical Exam:    VS:  BP (!) 110/58   Pulse 73   Ht 5\' 10"  (1.778 m)   Wt 214 lb (97.1 kg)   SpO2 95%   BMI 30.71 kg/m     Wt Readings from Last 3 Encounters:  02/25/21 214 lb (97.1 kg)  02/10/21 211 lb 13.8 oz (96.1 kg)  10/24/20 206 lb (93.4 kg)     Constitutional:      Appearance: Healthy appearance. Not in distress.  Neck:     Vascular: No JVR. JVD normal.  Pulmonary:     Effort: Pulmonary effort is normal.     Breath sounds: No wheezing. No rales.  Cardiovascular:     Normal rate. Regular rhythm. Normal S1. Normal S2.     Murmurs: There is no murmur.  Edema:    Peripheral edema absent.  Abdominal:     General: There is  distension.     Palpations: Abdomen is soft.  Skin:    General: Skin is warm and dry.  Neurological:     General: No focal deficit present.     Mental Status: Alert and oriented to person, place and time.     Cranial Nerves: Cranial nerves are intact.       ASSESSMENT & PLAN:    1. Coronary artery disease involving native coronary artery of native heart with unstable angina pectoris [facility]) History of prior CABG in 1996 and drug-eluting stent x2 to the vein graft to the PDA in 2012.  His last intervention was in December 2020 and he underwent a DES to the distal left main.  3 of his 5 grafts were occluded at that time.  He has had fairly chronic angina.  However, over the last several months, he has noted decreased exercise tolerance and reduced stamina.  He had an episode of chest discomfort yesterday requiring 3 nitroglycerin.  His electrocardiogram demonstrates downsloping inferior lateral T wave inversions.  These seem to be new.  I have recommended cardiac catheterization.  The patient is agreeable to this.  I reviewed his case with Dr. Domenic Polite.  He agrees.  The patient cannot take beta-blockers due to history of bradycardia.  His blood pressure will not tolerate further titration of his nitrates.    -Continue aspirin, clopidogrel, isosorbide, rosuvastatin.  -Proceed with cardiac catheterization later this week or early next week  -Follow-up 2 weeks post catheterization  -The patient  does go the emergency room if he has worsening symptoms.  2. Essential hypertension The patient's blood pressure is controlled on his current regimen.  Continue current therapy.   3. Mixed hyperlipidemia Continue statin therapy  4. Right leg pain He seems to have more symptoms at rest.  He does not really describe classic claudication.  We can certainly consider ABIs at his follow-up appointment.  5. Abdominal distention He notes a history of large ventral hernia.  He does not seem to think his abdomen  is any bigger.  He did have an ultrasound years ago that showed fatty liver.  It may be worthwhile obtaining an abdominal ultrasound at some point to assess for ascites.   Shared Decision Making/Informed Consent The risks [stroke (1 in 1000), death (1 in 1000), kidney failure [usually temporary] (1 in 500), bleeding (1 in 200), allergic reaction [possibly serious] (1 in 200)], benefits (diagnostic support and management of coronary artery disease) and alternatives of a cardiac catheterization were discussed in detail with Mr. Clinger and he is willing to proceed.   Dispo:  Return in about 3 weeks (around 03/18/2021) for Ermalinda Barrios, PA.   Medication Adjustments/Labs and Tests Ordered: Current medicines are reviewed at length with the patient today.  Concerns regarding medicines are outlined above.  Tests Ordered: Orders Placed This Encounter  Procedures  . Basic Metabolic Panel (BMET)  . CBC  . EKG 12-Lead   Medication Changes: No orders of the defined types were placed in this encounter.   Signed, Richardson Dopp, PA-C  02/25/2021 5:23 PM    [facility] [address], [place], [place]  [address] Phone: [phone]; Fax: [phone]

## 2021-02-25 ENCOUNTER — Ambulatory Visit: Payer: PPO | Admitting: Physician Assistant

## 2021-02-25 ENCOUNTER — Encounter: Payer: Self-pay | Admitting: Physician Assistant

## 2021-02-25 ENCOUNTER — Other Ambulatory Visit: Payer: Self-pay | Admitting: Physician Assistant

## 2021-02-25 ENCOUNTER — Other Ambulatory Visit: Payer: Self-pay

## 2021-02-25 VITALS — BP 110/58 | HR 73 | Ht 70.0 in | Wt 214.0 lb

## 2021-02-25 DIAGNOSIS — E782 Mixed hyperlipidemia: Secondary | ICD-10-CM | POA: Diagnosis not present

## 2021-02-25 DIAGNOSIS — I10 Essential (primary) hypertension: Secondary | ICD-10-CM

## 2021-02-25 DIAGNOSIS — M79604 Pain in right leg: Secondary | ICD-10-CM | POA: Diagnosis not present

## 2021-02-25 DIAGNOSIS — I25119 Atherosclerotic heart disease of native coronary artery with unspecified angina pectoris: Secondary | ICD-10-CM

## 2021-02-25 DIAGNOSIS — I25110 Atherosclerotic heart disease of native coronary artery with unstable angina pectoris: Secondary | ICD-10-CM | POA: Diagnosis not present

## 2021-02-25 DIAGNOSIS — R140 Abdominal distension (gaseous): Secondary | ICD-10-CM

## 2021-02-25 NOTE — Patient Instructions (Addendum)
Medication Instructions:  Your physician recommends that you continue on your current medications as directed. Please refer to the Current Medication list given to you today.  *If you need a refill on your cardiac medications before your next appointment, please call your pharmacy*   Lab Work: Your physician recommends that you have lab work today: bmet/cbc  If you have labs (blood work) drawn today and your tests are completely normal, you will receive your results only by: [name] MyChart Message (if you have MyChart) OR . A paper copy in the mail If you have any lab test that is abnormal or we need to change your treatment, we will call you to review the results.   Testing/Procedures: Your provider has recommended a cardiac catherization.   You will need a COVID test prior to your procedure - Go to Pre-Procedural COVID -19 testing at [address] in [place], [place] [address] on ____Friday, March 4 @ 2:20 pm_________________________________.   You are scheduled for a cardiac catheterization on March 7 with Dr. Saunders Revel or associate.  Please arrive at the [facility] (Main Entrance) at [facility] at [address], [place] Stay on Monday, March 7 at 7:00 am.  Free valet parking is available.   You are allowed only one visitor in the hospital with you. Both you and your visitor must wear masks.    Special note: Every effort is made to have your procedure done on time.   Please understand that emergencies sometimes delay a scheduled   procedure.  No solid foods after midnight on Sunday, March 6. You may have clear liquids until 5 am on the day of your procedure.     You may take your morning medications with a sip of water on the day of your procedure.  Please take a baby aspirin (81 mg) on the morning of your procedure.   Medications to HOLD - Lasix  Plan for a one night stay -- bring personal belongings.  Bring a current list of your medications and  current insurance cards.  You MUST have a responsible person to drive you home. Someone MUST be with you the first 24 hours after you arrive home or your discharge will be delayed. Wear clothes that are easy to get on and off and wear slip on shoes.    Coronary Angiogram A coronary angiogram, also called coronary angiography, is an X-ray procedure used to look at the arteries in the heart. In this procedure, a dye (contrast dye) is injected through a long, hollow tube (catheter). The catheter is about the size of a piece of cooked spaghetti and is inserted through your groin, wrist, or arm. The dye is injected into each artery, and X-rays are then taken to show if there is a blockage in the arteries of your heart.  LET [facility] CARE PROVIDER KNOW ABOUT:  Any allergies you have, including allergies to shellfish or contrast dye.    All medicines you are taking, including vitamins, herbs, eye drops, creams, and over-the-counter medicines.    Previous problems you or members of your family have had with the use of anesthetics.    Any blood disorders you have.    Previous surgeries you have had.  History of kidney problems or failure.    Other medical conditions you have.  RISKS AND COMPLICATIONS  Generally, a coronary angiogram is a safe procedure. However, about 1 person out of 1000 can have problems that may  include:  Allergic reaction to the dye.  Bleeding/bruising from the access site or other locations.  Kidney injury, especially in people with impaired kidney function.   Stroke (rare).  Heart attack (rare).  Irregular rhythms (rare)  Death (rare)  BEFORE THE PROCEDURE   Do not eat or drink anything after midnight the night before the procedure or as directed by your health care provider.    Ask your health care provider about changing or stopping your regular medicines. This is especially important if you are taking diabetes medicines or blood  thinners.  PROCEDURE  You may be given a medicine to help you relax (sedative) before the procedure. This medicine is given through an intravenous (IV) access tube that is inserted into one of your veins.    The area where the catheter will be inserted will be washed and shaved. This is usually done in the groin but may be done in the fold of your arm (near your elbow) or in the wrist.     A medicine will be given to numb the area where the catheter will be inserted (local anesthetic).    The health care provider will insert the catheter into an artery. The catheter will be guided by using a special type of X-ray (fluoroscopy) of the blood vessel being examined.    A special dye will then be injected into the catheter, and X-rays will be taken. The dye will help to show where any narrowing or blockages are located in the heart arteries.     AFTER THE PROCEDURE   If the procedure is done through the leg, you will be kept in bed lying flat for several hours. You will be instructed to not bend or cross your legs.  The insertion site will be checked frequently.    The pulse in your feet or wrist will be checked frequently.    Additional blood tests, X-rays, and an electrocardiogram may be done.       Follow-Up: At [facility], you and your health needs are our priority.  As part of our continuing mission to provide you with exceptional heart care, we have created designated Provider Care Teams.  These Care Teams include your primary Cardiologist (physician) and Advanced Practice Providers (APPs -  Physician Assistants and Nurse Practitioners) who all work together to provide you with the care you need, when you need it.  We recommend signing up for the patient portal called "MyChart".  Sign up information is provided on this After Visit Summary.  MyChart is used to connect with patients for Virtual Visits (Telemedicine).  Patients are able to view lab/test results, encounter notes,  upcoming appointments, etc.  Non-urgent messages can be sent to your provider as well.   To learn more about what you can do with MyChart, go to [email or web address].    Your next appointment:   3 week(s)  The format for your next appointment:   In Person  Provider:   You may see Rozann Lesches, MD or one of the following Advanced Practice Providers on your designated Care Team:    Bernerd Pho, PA-C   Ermalinda Barrios, [place]     Other Instructions

## 2021-02-25 NOTE — Telephone Encounter (Signed)
See my OV note from 02/25/2021. Richardson Dopp, PA-C    02/25/2021 5:52 PM

## 2021-02-26 ENCOUNTER — Telehealth: Payer: Self-pay | Admitting: *Deleted

## 2021-02-26 LAB — BASIC METABOLIC PANEL (7)
BUN/Creatinine Ratio: 20 (ref 10–24)
BUN: 21 mg/dL (ref 8–27)
CO2: 24 mmol/L (ref 20–29)
Chloride: 99 mmol/L (ref 96–106)
Creatinine, Ser: 1.03 mg/dL (ref 0.76–1.27)
Glucose: 119 mg/dL — ABNORMAL HIGH (ref 65–99)
Potassium: 4 mmol/L (ref 3.5–5.2)
Sodium: 140 mmol/L (ref 134–144)
eGFR: 72 mL/min/{1.73_m2} (ref >59)

## 2021-02-26 LAB — CBC WITH DIFFERENTIAL/PLATELET
Basophils Absolute: 0 10*3/uL (ref 0.0–0.2)
Basos: 0 %
EOS (ABSOLUTE): 0.1 10*3/uL (ref 0.0–0.4)
Eos: 1 %
Hematocrit: 38.7 % (ref 37.5–51.0)
Hemoglobin: 12.7 g/dL — ABNORMAL LOW (ref 13.0–17.7)
Immature Grans (Abs): 0.1 10*3/uL (ref 0.0–0.1)
Immature Granulocytes: 1 %
Lymphocytes Absolute: 2.3 10*3/uL (ref 0.7–3.1)
Lymphs: 19 %
MCH: 30.5 pg (ref 26.6–33.0)
MCHC: 32.8 g/dL (ref 31.5–35.7)
MCV: 93 fL (ref 79–97)
Monocytes Absolute: 1.2 10*3/uL — ABNORMAL HIGH (ref 0.1–0.9)
Monocytes: 10 %
Neutrophils Absolute: 8.4 10*3/uL — ABNORMAL HIGH (ref 1.4–7.0)
Neutrophils: 69 %
Platelets: 206 10*3/uL (ref 150–450)
RBC: 4.16 x10E6/uL (ref 4.14–5.80)
RDW: 12.8 % (ref 11.6–15.4)
WBC: 12.1 10*3/uL — ABNORMAL HIGH (ref 3.4–10.8)

## 2021-02-26 NOTE — Telephone Encounter (Addendum)
Pt contacted pre-catheterization scheduled at [facility] for: Monday March 02, 2021 9 AM Verified arrival time and place: [place] [facility]) at: 7 AM   No solid food after midnight prior to cath, clear liquids until 5 AM day of procedure.  Hold: Lasix-KCl-AM of procedure   Except hold medications AM meds can be  taken pre-cath with sips of water including: ASA 81 mg Plavix 75 mg   Confirmed patient has responsible adult to drive home post procedure and be with patient first 24 hours after arriving home: yes  You are allowed ONE visitor in the waiting room during the time you are at the hospital for your procedure. Both you and your visitor must wear a mask once you enter the hospital.   Reviewed procedure/mask/visitor instructions reviewed with patient and his wife (verbal permission).   02/25/21 WBC 12.2-wife states patient did have URI symptoms a few weeks ago and was given antibiotic by Dr Gerarda Fraction, reports these symptoms have improved, denies any dysuria.

## 2021-02-27 ENCOUNTER — Other Ambulatory Visit (HOSPITAL_COMMUNITY)
Admission: RE | Admit: 2021-02-27 | Discharge: 2021-02-27 | Disposition: A | Discharge status: Home / Self Care | Payer: PPO | Source: Ambulatory Visit | Attending: Internal Medicine | Admitting: Internal Medicine

## 2021-02-27 ENCOUNTER — Other Ambulatory Visit (HOSPITAL_COMMUNITY): Payer: PPO

## 2021-02-27 ENCOUNTER — Other Ambulatory Visit: Payer: Self-pay

## 2021-02-27 DIAGNOSIS — Z01812 Encounter for preprocedural laboratory examination: Secondary | ICD-10-CM | POA: Insufficient documentation

## 2021-02-27 DIAGNOSIS — Z20822 Contact with and (suspected) exposure to covid-19: Secondary | ICD-10-CM | POA: Insufficient documentation

## 2021-02-28 LAB — SARS CORONAVIRUS 2 (TAT 6-24 HRS): SARS Coronavirus 2: NEGATIVE

## 2021-03-02 ENCOUNTER — Encounter (HOSPITAL_COMMUNITY)
Admission: RE | Disposition: A | Discharge status: Home / Self Care | Payer: Self-pay | Source: Home / Self Care | Attending: Internal Medicine

## 2021-03-02 ENCOUNTER — Other Ambulatory Visit: Payer: Self-pay

## 2021-03-02 ENCOUNTER — Ambulatory Visit (HOSPITAL_COMMUNITY)
Admission: RE | Admit: 2021-03-02 | Discharge: 2021-03-02 | Disposition: A | Discharge status: Home / Self Care | Payer: PPO | Attending: Internal Medicine | Admitting: Internal Medicine

## 2021-03-02 ENCOUNTER — Encounter (HOSPITAL_COMMUNITY): Payer: Self-pay | Admitting: Internal Medicine

## 2021-03-02 DIAGNOSIS — Z79899 Other long term (current) drug therapy: Secondary | ICD-10-CM | POA: Diagnosis not present

## 2021-03-02 DIAGNOSIS — I10 Essential (primary) hypertension: Secondary | ICD-10-CM | POA: Diagnosis not present

## 2021-03-02 DIAGNOSIS — I200 Unstable angina: Secondary | ICD-10-CM | POA: Diagnosis present

## 2021-03-02 DIAGNOSIS — K439 Ventral hernia without obstruction or gangrene: Secondary | ICD-10-CM | POA: Insufficient documentation

## 2021-03-02 DIAGNOSIS — Z8249 Family history of ischemic heart disease and other diseases of the circulatory system: Secondary | ICD-10-CM | POA: Insufficient documentation

## 2021-03-02 DIAGNOSIS — Z7902 Long term (current) use of antithrombotics/antiplatelets: Secondary | ICD-10-CM | POA: Diagnosis not present

## 2021-03-02 DIAGNOSIS — Z87891 Personal history of nicotine dependence: Secondary | ICD-10-CM | POA: Insufficient documentation

## 2021-03-02 DIAGNOSIS — Z955 Presence of coronary angioplasty implant and graft: Secondary | ICD-10-CM | POA: Diagnosis not present

## 2021-03-02 DIAGNOSIS — I2582 Chronic total occlusion of coronary artery: Secondary | ICD-10-CM | POA: Insufficient documentation

## 2021-03-02 DIAGNOSIS — Z951 Presence of aortocoronary bypass graft: Secondary | ICD-10-CM | POA: Diagnosis not present

## 2021-03-02 DIAGNOSIS — M79604 Pain in right leg: Secondary | ICD-10-CM | POA: Diagnosis not present

## 2021-03-02 DIAGNOSIS — Z7982 Long term (current) use of aspirin: Secondary | ICD-10-CM | POA: Diagnosis not present

## 2021-03-02 DIAGNOSIS — I25110 Atherosclerotic heart disease of native coronary artery with unstable angina pectoris: Secondary | ICD-10-CM | POA: Insufficient documentation

## 2021-03-02 DIAGNOSIS — E782 Mixed hyperlipidemia: Secondary | ICD-10-CM | POA: Diagnosis not present

## 2021-03-02 DIAGNOSIS — I25710 Atherosclerosis of autologous vein coronary artery bypass graft(s) with unstable angina pectoris: Secondary | ICD-10-CM

## 2021-03-02 DIAGNOSIS — Z79890 Hormone replacement therapy (postmenopausal): Secondary | ICD-10-CM | POA: Insufficient documentation

## 2021-03-02 HISTORY — PX: LEFT HEART CATH AND CORS/GRAFTS ANGIOGRAPHY: CATH118250

## 2021-03-02 SURGERY — LEFT HEART CATH AND CORS/GRAFTS ANGIOGRAPHY
Anesthesia: LOCAL

## 2021-03-02 MED ORDER — IOHEXOL 350 MG/ML SOLN
INTRAVENOUS | Status: AC
  Filled 2021-03-02: qty 1

## 2021-03-02 MED ORDER — CLOPIDOGREL BISULFATE 75 MG PO TABS
75.0000 mg | ORAL_TABLET | Freq: Once | ORAL | Status: AC
  Administered 2021-03-02: 75 mg via ORAL

## 2021-03-02 MED ORDER — MIDAZOLAM HCL 2 MG/2ML IJ SOLN
INTRAMUSCULAR | Status: DC | PRN
  Administered 2021-03-02: 1 mg via INTRAVENOUS

## 2021-03-02 MED ORDER — IOHEXOL 350 MG/ML SOLN
INTRAVENOUS | Status: DC | PRN
  Administered 2021-03-02: 70 mL

## 2021-03-02 MED ORDER — MIDAZOLAM HCL 2 MG/2ML IJ SOLN
INTRAMUSCULAR | Status: AC
  Filled 2021-03-02: qty 2

## 2021-03-02 MED ORDER — SODIUM CHLORIDE 0.9 % IV SOLN: INTRAVENOUS | Status: DC

## 2021-03-02 MED ORDER — SODIUM CHLORIDE 0.9% FLUSH: 3.0000 mL | Freq: Two times a day (BID) | INTRAVENOUS | Status: DC

## 2021-03-02 MED ORDER — HEPARIN (PORCINE) IN NACL 1000-0.9 UT/500ML-% IV SOLN
INTRAVENOUS | Status: AC
  Filled 2021-03-02: qty 500

## 2021-03-02 MED ORDER — SODIUM CHLORIDE 0.9 % IV SOLN: 250.0000 mL | INTRAVENOUS | Status: DC | PRN

## 2021-03-02 MED ORDER — LIDOCAINE HCL (PF) 1 % IJ SOLN
INTRAMUSCULAR | Status: AC
  Filled 2021-03-02: qty 30

## 2021-03-02 MED ORDER — FUROSEMIDE 40 MG PO TABS: 20.0000 mg | ORAL_TABLET | Freq: Every morning | ORAL | 5 refills | Status: DC

## 2021-03-02 MED ORDER — SODIUM CHLORIDE 0.9 % WEIGHT BASED INFUSION
3.0000 mL/kg/h | INTRAVENOUS | Status: AC
  Administered 2021-03-02: 3 mL/kg/h via INTRAVENOUS

## 2021-03-02 MED ORDER — FENTANYL CITRATE (PF) 100 MCG/2ML IJ SOLN
INTRAMUSCULAR | Status: DC | PRN
  Administered 2021-03-02: 25 ug via INTRAVENOUS

## 2021-03-02 MED ORDER — HEPARIN (PORCINE) IN NACL 1000-0.9 UT/500ML-% IV SOLN
INTRAVENOUS | Status: DC | PRN
  Administered 2021-03-02 (×2): 500 mL

## 2021-03-02 MED ORDER — HEPARIN SODIUM (PORCINE) 1000 UNIT/ML IJ SOLN
INTRAMUSCULAR | Status: DC | PRN
  Administered 2021-03-02: 5000 [IU] via INTRAVENOUS

## 2021-03-02 MED ORDER — ACETAMINOPHEN 325 MG PO TABS: 650.0000 mg | ORAL_TABLET | ORAL | Status: DC | PRN

## 2021-03-02 MED ORDER — SODIUM CHLORIDE 0.9% FLUSH: 3.0000 mL | INTRAVENOUS | Status: DC | PRN

## 2021-03-02 MED ORDER — HEPARIN SODIUM (PORCINE) 1000 UNIT/ML IJ SOLN
INTRAMUSCULAR | Status: AC
  Filled 2021-03-02: qty 1

## 2021-03-02 MED ORDER — LIDOCAINE HCL (PF) 1 % IJ SOLN
INTRAMUSCULAR | Status: DC | PRN
  Administered 2021-03-02: 1 mL

## 2021-03-02 MED ORDER — ASPIRIN 81 MG PO CHEW: 81.0000 mg | CHEWABLE_TABLET | ORAL | Status: DC

## 2021-03-02 MED ORDER — HYDRALAZINE HCL 20 MG/ML IJ SOLN: 10.0000 mg | INTRAMUSCULAR | Status: DC | PRN

## 2021-03-02 MED ORDER — LABETALOL HCL 5 MG/ML IV SOLN
10.0000 mg | INTRAVENOUS | Status: DC | PRN
Start: 2021-03-02 — End: 2021-03-02

## 2021-03-02 MED ORDER — ONDANSETRON HCL 4 MG/2ML IJ SOLN: 4.0000 mg | Freq: Four times a day (QID) | INTRAMUSCULAR | Status: DC | PRN

## 2021-03-02 MED ORDER — FENTANYL CITRATE (PF) 100 MCG/2ML IJ SOLN
INTRAMUSCULAR | Status: AC
  Filled 2021-03-02: qty 2

## 2021-03-02 MED ORDER — VERAPAMIL HCL 2.5 MG/ML IV SOLN
INTRAVENOUS | Status: AC
  Filled 2021-03-02: qty 2

## 2021-03-02 MED ORDER — FUROSEMIDE 40 MG PO TABS: 40.0000 mg | ORAL_TABLET | Freq: Every morning | ORAL | 5 refills | Status: DC

## 2021-03-02 MED ORDER — VERAPAMIL HCL 2.5 MG/ML IV SOLN
INTRAVENOUS | Status: DC | PRN
  Administered 2021-03-02: 10 mL via INTRA_ARTERIAL

## 2021-03-02 MED ORDER — CLOPIDOGREL BISULFATE 75 MG PO TABS
ORAL_TABLET | ORAL | Status: AC
  Filled 2021-03-02: qty 1

## 2021-03-02 MED ORDER — SODIUM CHLORIDE 0.9 % WEIGHT BASED INFUSION: 1.0000 mL/kg/h | INTRAVENOUS | Status: DC

## 2021-03-02 SURGICAL SUPPLY — 10 items
CATH INFINITI 5 FR IM (CATHETERS) ×1 IMPLANT
CATH INFINITI 5FR MULTPACK ANG (CATHETERS) ×1 IMPLANT
DEVICE RAD COMP TR BAND LRG (VASCULAR PRODUCTS) ×1 IMPLANT
GLIDESHEATH SLEND SS 6F .021 (SHEATH) ×1 IMPLANT
GUIDEWIRE INQWIRE 1.5J.035X260 (WIRE) IMPLANT
INQWIRE 1.5J .035X260CM (WIRE) ×2
KIT HEART LEFT (KITS) ×2 IMPLANT
PACK CARDIAC CATHETERIZATION (CUSTOM PROCEDURE TRAY) ×2 IMPLANT
TRANSDUCER W/STOPCOCK (MISCELLANEOUS) ×2 IMPLANT
TUBING CIL FLEX 10 FLL-RA (TUBING) ×2 IMPLANT

## 2021-03-02 NOTE — Interval H&P Note (Signed)
History and Physical Interval Note:  03/02/2021 9:02 AM  Dustin [name]  has presented today for surgery, with the diagnosis of accelerating angina.  The various methods of treatment have been discussed with the patient and family. After consideration of risks, benefits and other options for treatment, the patient has consented to  Procedure(s): LEFT HEART CATH AND CORS/GRAFTS ANGIOGRAPHY (N/A) as a surgical intervention.  The patient's history has been reviewed, patient examined, no change in status, stable for surgery.  I have reviewed the patient's chart and labs.  Questions were answered to the patient's satisfaction.    Cath Lab Visit (complete for each Cath Lab visit)  Clinical Evaluation Leading to the Procedure:   ACS: No.  Non-ACS:    Anginal Classification: CCS III  Anti-ischemic medical therapy: Minimal Therapy (1 class of medications)  Non-Invasive Test Results: No non-invasive testing performed  Prior CABG: Previous CABG  Dustin [name]

## 2021-03-02 NOTE — Progress Notes (Signed)
Discharge instructions reviewed with patient and family. Verbalized understanding. 

## 2021-03-02 NOTE — Discharge Instructions (Signed)
Radial Site Care  This sheet gives you information about how to care for yourself after your procedure. Your health care provider may also give you more specific instructions. If you have problems or questions, contact your health care provider. What can I expect after the procedure? After the procedure, it is common to have:  Bruising and tenderness at the catheter insertion area. Follow these instructions at home: Medicines  Take over-the-counter and prescription medicines only as told by your health care provider. Insertion site care  Follow instructions from your health care provider about how to take care of your insertion site. Make sure you: ? Wash your hands with soap and water before you change your bandage (dressing). If soap and water are not available, use hand sanitizer. ? Change your dressing as told by your health care provider. ? Leave stitches (sutures), skin glue, or adhesive strips in place. These skin closures may need to stay in place for 2 weeks or longer. If adhesive strip edges start to loosen and curl up, you may trim the loose edges. Do not remove adhesive strips completely unless your health care provider tells you to do that.  Check your insertion site every day for signs of infection. Check for: ? Redness, swelling, or pain. ? Fluid or blood. ? Pus or a bad smell. ? Warmth.  Do not take baths, swim, or use a hot tub until your health care provider approves.  You may shower 24-48 hours after the procedure, or as directed by your health care provider. ? Remove the dressing and gently wash the site with plain soap and water. ? Pat the area dry with a clean towel. ? Do not rub the site. That could cause bleeding.  Do not apply powder or lotion to the site. Activity  For 24 hours after the procedure, or as directed by your health care provider: ? Do not flex or bend the affected arm. ? Do not push or pull heavy objects with the affected arm. ? Do not drive  yourself home from the hospital or clinic. You may drive 24 hours after the procedure unless your health care provider tells you not to. ? Do not operate machinery or power tools.  Do not lift anything that is heavier than 10 lb (4.5 kg), or the limit that you are told, until your health care provider says that it is safe.  Ask your health care provider when it is okay to: ? Return to work or school. ? Resume usual physical activities or sports. ? Resume sexual activity.   General instructions  If the catheter site starts to bleed, raise your arm and put firm pressure on the site. If the bleeding does not stop, get help right away. This is a medical emergency.  If you went home on the same day as your procedure, a responsible adult should be with you for the first 24 hours after you arrive home.  Keep all follow-up visits as told by your health care provider. This is important. Contact a health care provider if:  You have a fever.  You have redness, swelling, or yellow drainage around your insertion site. Get help right away if:  You have unusual pain at the radial site.  The catheter insertion area swells very fast.  The insertion area is bleeding, and the bleeding does not stop when you hold steady pressure on the area.  Your arm or hand becomes pale, cool, tingly, or numb. These symptoms may represent a serious   problem that is an emergency. Do not wait to see if the symptoms will go away. Get medical help right away. Call your local emergency services (911 in the [place] Do not drive yourself to the hospital. Summary  After the procedure, it is common to have bruising and tenderness at the site.  Follow instructions from your health care provider about how to take care of your radial site wound. Check the wound every day for signs of infection.  Do not lift anything that is heavier than 10 lb (4.5 kg), or the limit that you are told, until your health care provider says that it  is safe. This information is not intended to replace advice given to you by your health care provider. Make sure you discuss any questions you have with your health care provider. Document Revised: 01/18/2018 Document Reviewed: 01/18/2018 Elsevier Patient Education  2021 [employer]  

## 2021-03-03 ENCOUNTER — Telehealth: Payer: Self-pay | Admitting: *Deleted

## 2021-03-03 NOTE — Telephone Encounter (Signed)
Mr. Boccio just had a heart catheterization yesterday.  In the cardiac catheterization report, Dr. Saunders Revel recommended increasing Lasix to 40 mg daily.  He should be taking Lasix 40 mg each morning.

## 2021-03-03 NOTE — Telephone Encounter (Signed)
Received call from [facility] stating that Pt is unclear on how much lasix to take daily. Pt's pill pack has 20 mg BID and pt has another bottle of lasix 40 mg that states to take daily. This am pt took 40 mg tablet and 20 mg tablet from pill pack. Instructed [facility]  not to have pt take another dose of lasix today. Our record states that pt is on 40 mg lasix Daily. [facility] will call pharmacy and have medications repackaged. Please advise.

## 2021-03-04 NOTE — Telephone Encounter (Signed)
Informed wife that pt is to be taking 40 mg daily. Spoke with pharmacy and informed that patient's new pill pack has correct dose of lasix.

## 2021-03-09 ENCOUNTER — Ambulatory Visit (INDEPENDENT_AMBULATORY_CARE_PROVIDER_SITE_OTHER): Payer: PPO | Admitting: Urology

## 2021-03-09 ENCOUNTER — Other Ambulatory Visit: Payer: Self-pay

## 2021-03-09 ENCOUNTER — Encounter: Payer: Self-pay | Admitting: Urology

## 2021-03-09 VITALS — BP 126/68 | HR 66 | Temp 97.7°F | Ht 70.0 in | Wt 215.0 lb

## 2021-03-09 DIAGNOSIS — R300 Dysuria: Secondary | ICD-10-CM

## 2021-03-09 DIAGNOSIS — N401 Enlarged prostate with lower urinary tract symptoms: Secondary | ICD-10-CM

## 2021-03-09 DIAGNOSIS — N138 Other obstructive and reflux uropathy: Secondary | ICD-10-CM | POA: Diagnosis not present

## 2021-03-09 LAB — BLADDER SCAN AMB NON-IMAGING: Scan Result: 14

## 2021-03-09 MED ORDER — ALFUZOSIN HCL ER 10 MG PO TB24: 10.0000 mg | ORAL_TABLET | Freq: Every day | ORAL | 11 refills | Status: DC

## 2021-03-09 MED ORDER — DUTASTERIDE 0.5 MG PO CAPS
0.5000 mg | ORAL_CAPSULE | Freq: Every day | ORAL | 3 refills | Status: DC
Start: 2021-03-09 — End: 2022-03-10

## 2021-03-09 NOTE — Progress Notes (Signed)
03/09/2021 2:04 PM   [name] [birth year] [id number]  Referring provider: [name], MD [address] [place],  [place] [address]  Followup BPH  HPI: Mr Hellinger is a 87yo here for followup for BPH and dysuria. Dysuria resolved. His nocturia has improved to 2x on uroxatral. IPSS 10 QOL 2. NO hematuria or dysuria. He is very happy with his response to increasing uroxatral to 10mg  qhs. No other complaints today   PMH: Past Medical History:  Diagnosis Date  . Alzheimer disease ([place])   . Anxiety   . Arthritis   . Atrophic gastritis    a. By EGD 02/2013.  . Carotid artery disease ([place])    a. mild-mod plaque, <50% stenosis bilat by duplex 2018.  [name] Coronary atherosclerosis of native coronary artery    a. Multivessel s/p CABG 1996. b. s/p DES x 2 SVG to PDA 8/12 with distal disease managed medically.  . DDD (degenerative disc disease)    Chronic back pain  . Enlarged prostate   . Essential hypertension   . Hematuria   . Hypothyroidism   . LBBB (left bundle branch block)   . MI (myocardial infarction) ([place])   . Mixed hyperlipidemia   . OA (osteoarthritis)   . OSA (obstructive sleep apnea)   . Pneumonia due to COVID-19 virus    February 2021  . Sinus bradycardia    a. Aricept and BB discontinued due to this.    Surgical History: Past Surgical History:  Procedure Laterality Date  . COLONOSCOPY  08/03/2004   Jenkins-numerous large diverticula in the descending, transverse, descending, and sigmoid colon. Otherwise normal exam.  . COLONOSCOPY  07/12/2012   RMR: External hemorrhoidal tag; multiple rectal and colonic polyps removed and/or treated as described above. Pancolonic diverticulosis. Bx-tubular adenomas, rectal hyperplastic polyp. next colonoscopy in 06/2015.  [name] COLONOSCOPY N/A 06/22/2016   Procedure: COLONOSCOPY;  Surgeon: Aviva Signs, MD;  [facility];  Service: Gastroenterology;  Laterality: N/A;  730  . CORONARY ANGIOPLASTY WITH STENT PLACEMENT     . CORONARY ARTERY BYPASS GRAFT  1996   LIMA to LAD, SVG to D2, SVG to PDA, SVG to OM1 and OM2  . CORONARY STENT INTERVENTION N/A 12/10/2019   Procedure: CORONARY STENT INTERVENTION;  Surgeon: Burnell Blanks, MD;  Location: [facility];  Service: Cardiovascular;  Laterality: N/A;  . ESOPHAGOGASTRODUODENOSCOPY N/A 03/16/2013   Procedure: ESOPHAGOGASTRODUODENOSCOPY (EGD);  Surgeon: Daneil Dolin, MD;  [facility];  Service: Endoscopy;  Laterality: N/A;  12:00-moved to [place] notified pt  . ESOPHAGOGASTRODUODENOSCOPY N/A 03/06/2013   Procedure: ESOPHAGOGASTRODUODENOSCOPY (EGD);  Surgeon: Daneil Dolin, MD;  [facility];  Service: Endoscopy;  Laterality: N/A;  . HERNIA REPAIR    . INTRAVASCULAR ULTRASOUND/IVUS N/A 12/10/2019   Procedure: Intravascular Ultrasound/IVUS;  Surgeon: Burnell Blanks, MD;  Location: [facility];  Service: Cardiovascular;  Laterality: N/A;  . LEFT HEART CATH AND CORS/GRAFTS ANGIOGRAPHY N/A 12/10/2019   Procedure: LEFT HEART CATH AND CORS/GRAFTS ANGIOGRAPHY;  Surgeon: Burnell Blanks, MD;  Location: [facility];  Service: Cardiovascular;  Laterality: N/A;  . LEFT HEART CATH AND CORS/GRAFTS ANGIOGRAPHY N/A 03/02/2021   Procedure: LEFT HEART CATH AND CORS/GRAFTS ANGIOGRAPHY;  Surgeon: Nelva Bush, MD;  Location: [facility];  Service: Cardiovascular;  Laterality: N/A;    Home Medications:  Allergies as of 03/09/2021   No Known Allergies     Medication List  Accurate as of March 09, 2021  2:04 PM. If you have any questions, ask your nurse or doctor.        albuterol (2.5 MG/3ML) 0.083% nebulizer solution Commonly known as: PROVENTIL Take 3 mLs (2.5 mg total) by nebulization every 6 (six) hours as needed for wheezing or shortness of breath. Patient will also need the nebulizer machine with this prescription.   albuterol 108 (90 Base) MCG/ACT inhaler Commonly known as:  VENTOLIN HFA Inhale 2 puffs into the lungs every 6 (six) hours as needed for wheezing or shortness of breath.   alfuzosin 10 MG 24 hr tablet Commonly known as: UROXATRAL Take 1 tablet (10 mg total) by mouth daily with breakfast.   aspirin EC 81 MG tablet Take 81 mg by mouth daily.   budesonide-formoterol 160-4.5 MCG/ACT inhaler Commonly known as: Symbicort Inhale 2 puffs into the lungs 2 (two) times daily.   busPIRone 10 MG tablet Commonly known as: BUSPAR Take 10 mg by mouth 2 (two) times daily.   clopidogrel 75 MG tablet Commonly known as: PLAVIX TAKE 1 TABLET BY MOUTH DAILY.   doxazosin 1 MG tablet Commonly known as: CARDURA Take 1 mg by mouth at bedtime.   dutasteride 0.5 MG capsule Commonly known as: AVODART Take 0.5 mg by mouth. In the afternoon   escitalopram 20 MG tablet Commonly known as: LEXAPRO Take 20 mg by mouth every morning.   furosemide 40 MG tablet Commonly known as: LASIX Take 1 tablet (40 mg total) by mouth in the morning.   guaiFENesin 600 MG 12 hr tablet Commonly known as: MUCINEX Take 1 tablet (600 mg total) by mouth 2 (two) times daily.   hydrOXYzine 25 MG tablet Commonly known as: ATARAX/VISTARIL Take 25 mg by mouth 2 (two) times daily as needed for anxiety.   isosorbide mononitrate 30 MG 24 hr tablet Commonly known as: IMDUR Take 1 tablet (30 mg total) by mouth daily. What changed: how much to take   isosorbide mononitrate 60 MG 24 hr tablet Commonly known as: IMDUR Take 120 mg by mouth every morning. What changed: Another medication with the same name was changed. Make sure you understand how and when to take each.   levothyroxine 50 MCG tablet Commonly known as: SYNTHROID Take 50 mcg by mouth every morning.   memantine 10 MG tablet Commonly known as: NAMENDA Take 10 mg by mouth 2 (two) times daily.   Movantik 25 MG Tabs tablet Generic drug: naloxegol oxalate Take 25 mg by mouth daily.   nitroGLYCERIN 0.4 MG SL  tablet Commonly known as: NITROSTAT Place 1 tablet (0.4 mg total) under the tongue every 5 (five) minutes as needed for chest pain. Pt has bought nitro bottle from home and oxycodone 30 mg ( 3 or 4 tabs) is in nitro bottle per wife Dayvian Blixt.   oxycodone 30 MG immediate release tablet Commonly known as: ROXICODONE Take 30 mg by mouth every 4 (four) hours as needed for pain. Take 1 tablet by mouth every 4 to 6 hours as needed for pain.  Max 8 /24 hours.   pantoprazole 40 MG tablet Commonly known as: PROTONIX Take 1 tablet (40 mg total) by mouth every morning.   potassium chloride SA 20 MEQ tablet Commonly known as: KLOR-CON Take 20 mEq by mouth daily.   QUEtiapine 25 MG tablet Commonly known as: SEROQUEL Take 25 mg by mouth at bedtime.   rosuvastatin 40 MG tablet Commonly known as: CRESTOR Take 1 tablet (40 mg total) by  mouth daily at 6 PM.   tamsulosin 0.4 MG Caps capsule Commonly known as: FLOMAX Take 1 capsule (0.4 mg total) by mouth daily. To help you pee.   Vitamin D (Ergocalciferol) 1.25 MG (50000 UNIT) Caps capsule Commonly known as: DRISDOL Take 50,000 Units by mouth once a week.   zinc sulfate 220 (50 Zn) MG capsule Take 1 capsule (220 mg total) by mouth daily.       Allergies: No Known Allergies  Family History: Family History  Problem Relation Age of Onset  . Heart disease Other   . Heart attack Mother   . Colon cancer Neg Hx     Social History:  reports that he quit smoking about 26 years ago. His smoking use included cigarettes. He has a 80.00 pack-year smoking history. He has never used smokeless tobacco. He reports that he does not drink alcohol and does not use drugs.  ROS: All other review of systems were reviewed and are negative except what is noted above in HPI  Physical Exam: BP 126/68   Pulse 66   Temp 97.7 F (36.5 C)   Ht 5\' 10"  (1.778 m)   Wt 215 lb (97.5 kg)   BMI 30.85 kg/m   Constitutional:  Alert and oriented, No acute  distress. HEENT: [place] AT, moist mucus membranes.  Trachea midline, no masses. Cardiovascular: No clubbing, cyanosis, or edema. Respiratory: Normal respiratory effort, no increased work of breathing. GI: Abdomen is soft, nontender, nondistended, no abdominal masses GU: No CVA tenderness.  Lymph: No cervical or inguinal lymphadenopathy. Skin: No rashes, bruises or suspicious lesions. Neurologic: Grossly intact, no focal deficits, moving all 4 extremities. Psychiatric: Normal mood and affect.  Laboratory Data: Lab Results  Component Value Date   WBC 12.1 (H) 02/25/2021   HGB 12.7 (L) 02/25/2021   HCT 38.7 02/25/2021   MCV 93 02/25/2021   PLT 206 02/25/2021    Lab Results  Component Value Date   CREATININE 1.03 02/25/2021    Lab Results  Component Value Date   PSA 0.50 01/26/2013    No results found for: TESTOSTERONE  Lab Results  Component Value Date   HGBA1C 6.5 (H) 02/11/2021    Urinalysis    Component Value Date/Time   COLORURINE YELLOW 02/10/2021 1213   APPEARANCEUR HAZY (A) 02/10/2021 1213   APPEARANCEUR Clear 10/24/2020 1111   LABSPEC 1.013 02/10/2021 1213   PHURINE 5.0 02/10/2021 1213   GLUCOSEU NEGATIVE 02/10/2021 1213   HGBUR MODERATE (A) 02/10/2021 1213   BILIRUBINUR NEGATIVE 02/10/2021 1213   BILIRUBINUR Negative 10/24/2020 1111   KETONESUR 5 (A) 02/10/2021 1213   PROTEINUR 100 (A) 02/10/2021 1213   UROBILINOGEN 0.2 01/26/2015 2247   NITRITE NEGATIVE 02/10/2021 1213   LEUKOCYTESUR NEGATIVE 02/10/2021 1213    Lab Results  Component Value Date   LABMICR See below: 10/24/2020   WBCUA 0-5 10/24/2020   LABEPIT None seen 10/24/2020   MUCUS Present (A) 10/24/2020   BACTERIA RARE (A) 02/10/2021    Pertinent Imaging:  Results for orders placed during the hospital encounter of 12/09/19  DG Abd 1 View  Narrative CLINICAL DATA:  Constipation.  EXAM: ABDOMEN - 1 VIEW  COMPARISON:  January 22, 2013  FINDINGS: The bowel gas pattern is normal. No  radio-opaque calculi or other significant radiographic abnormality are seen.  IMPRESSION: Negative.   Electronically Signed By: Constance Holster M.D. On: 12/10/2019 00:53  No results found for this or any previous visit.  No results found for this or  any previous visit.  No results found for this or any previous visit.  No results found for this or any previous visit.  No results found for this or any previous visit.  No results found for this or any previous visit.  No results found for this or any previous visit.   Assessment & Plan:    1. Dysuria -resolved - Urinalysis, Routine w reflex microscopic - BLADDER SCAN AMB NON-IMAGING  2. Benign prostatic hyperplasia with urinary obstruction with nocturia Continue uroxatral 10mg     No follow-ups on file.  Nicolette Bang, MD  [facility] [place]

## 2021-03-09 NOTE — Patient Instructions (Signed)

## 2021-03-09 NOTE — Progress Notes (Signed)
Bladder Scan Patient can void: 14 ml Performed By: Durenda Guthrie, lpn   Urological Symptom Review  Patient is experiencing the following symptoms: Get up at night to urinate Stream starts and stops Have to strain to urinate   Review of Systems  Gastrointestinal (upper)  : Negative for upper GI symptoms  Gastrointestinal (lower) : Negative for lower GI symptoms  Constitutional : Negative for symptoms  Skin: Negative for skin symptoms  Eyes: Negative for eye symptoms  Ear/Nose/Throat : Negative for Ear/Nose/Throat symptoms  Hematologic/Lymphatic: Negative for Hematologic/Lymphatic symptoms  Cardiovascular : Negative for cardiovascular symptoms  Respiratory : Negative for respiratory symptoms  Endocrine: Negative for endocrine symptoms  Musculoskeletal: Negative for musculoskeletal symptoms  Neurological: Negative for neurological symptoms  Psychologic: Negative for psychiatric symptoms

## 2021-03-10 DIAGNOSIS — N179 Acute kidney failure, unspecified: Secondary | ICD-10-CM | POA: Diagnosis not present

## 2021-03-10 DIAGNOSIS — G9341 Metabolic encephalopathy: Secondary | ICD-10-CM | POA: Diagnosis not present

## 2021-03-10 DIAGNOSIS — J441 Chronic obstructive pulmonary disease with (acute) exacerbation: Secondary | ICD-10-CM | POA: Diagnosis not present

## 2021-03-10 DIAGNOSIS — J9601 Acute respiratory failure with hypoxia: Secondary | ICD-10-CM | POA: Diagnosis not present

## 2021-03-10 LAB — URINALYSIS, ROUTINE W REFLEX MICROSCOPIC
Bilirubin, UA: NEGATIVE
Glucose, UA: NEGATIVE
Ketones, UA: NEGATIVE
Leukocytes,UA: NEGATIVE
Nitrite, UA: NEGATIVE
Protein,UA: NEGATIVE
Specific Gravity, UA: 1.01 (ref 1.005–1.030)
Urobilinogen, Ur: 0.2 mg/dL (ref 0.2–1.0)
pH, UA: 5.5 (ref 5.0–7.5)

## 2021-03-10 LAB — MICROSCOPIC EXAMINATION
Bacteria, UA: NONE SEEN
Epithelial Cells (non renal): NONE SEEN /hpf (ref 0–10)
Renal Epithel, UA: NONE SEEN /hpf
WBC, UA: NONE SEEN /hpf (ref 0–5)

## 2021-03-12 DIAGNOSIS — U071 COVID-19: Secondary | ICD-10-CM | POA: Diagnosis not present

## 2021-03-12 DIAGNOSIS — G9341 Metabolic encephalopathy: Secondary | ICD-10-CM | POA: Diagnosis not present

## 2021-03-12 DIAGNOSIS — J1282 Pneumonia due to coronavirus disease 2019: Secondary | ICD-10-CM | POA: Diagnosis not present

## 2021-03-12 DIAGNOSIS — N179 Acute kidney failure, unspecified: Secondary | ICD-10-CM | POA: Diagnosis not present

## 2021-03-13 DIAGNOSIS — N179 Acute kidney failure, unspecified: Secondary | ICD-10-CM | POA: Diagnosis not present

## 2021-03-13 DIAGNOSIS — J1282 Pneumonia due to coronavirus disease 2019: Secondary | ICD-10-CM | POA: Diagnosis not present

## 2021-03-13 DIAGNOSIS — G9341 Metabolic encephalopathy: Secondary | ICD-10-CM | POA: Diagnosis not present

## 2021-03-13 DIAGNOSIS — U071 COVID-19: Secondary | ICD-10-CM | POA: Diagnosis not present

## 2021-03-17 NOTE — Progress Notes (Signed)
Cardiology Office Note    Date:  03/23/2021   ID:  Dustin [name], DOB 1934-03-25, MRN [id number]   PCP:  [name], [place]  Cardiologist:  Rozann Lesches, MD  Advanced Practice Provider:  No care team member to display Electrophysiologist:  None   [phone]   Chief Complaint  Patient presents with  . Follow-up    History of Present Illness:  Dustin [name] is a 87 y.o. male with a history of CAD status post CABG 1996, DES x2 SVG to the PDA 2012 distal disease managed medically, DES to the distal left main 11/2019, 2 out of 5 grafts patent, hyperlipidemia, sinus bradycardia Aricept and beta-blocker stopped, LBBB,  Patient saw Richardson Dopp, PA-C 02/25/2021 with worsening angina and EKG changes.  Cardiac catheterization was recommended and performed 03/02/2021 and was found to have severe native disease including a 99% mid LAD with competitive flow from the LIMA to the LAD, chronic total circumflex and RCA, widely patent LMCA stent, widely patent LIMA to the LAD, patent SVG to OM1, jump limb to OM 2 chronically occluded, chronically occluded SVG to D2 and SVG to PDA, mildly reduced LVEF 45 to 50%, moderately elevated LVEDP 25 to 30 mmHg.  Recommended increasing Lasix to 40 mg daily and antianginals.  Consider echo to reassess LVEF.  Patient comes in for f/u. Chest pain has improved some but still having chest tightness mostly at rest relieved with NTG. No endurance -gets short of breath just walking in here. Has a lot of different chest pain-some tightness, some sharp pains and some rumblings. Difficult to discern what comes from his heart. Doesn't feel like he's putting out a lot fluid. Eats out all the time. Eats a lot of canned foods. A lot of abdominal distention.   Past Medical History:  Diagnosis Date  . Alzheimer disease ([place])   . Anxiety   . Arthritis   . Atrophic gastritis    a. By EGD 02/2013.  . Carotid artery disease ([place])    a.  mild-mod plaque, <50% stenosis bilat by duplex 2018.  [name] Coronary atherosclerosis of native coronary artery    a. Multivessel s/p CABG 1996. b. s/p DES x 2 SVG to PDA 8/12 with distal disease managed medically.  . DDD (degenerative disc disease)    Chronic back pain  . Enlarged prostate   . Essential hypertension   . Hematuria   . Hypothyroidism   . LBBB (left bundle branch block)   . MI (myocardial infarction) ([place])   . Mixed hyperlipidemia   . OA (osteoarthritis)   . OSA (obstructive sleep apnea)   . Pneumonia due to COVID-19 virus    February 2021  . Sinus bradycardia    a. Aricept and BB discontinued due to this.    Past Surgical History:  Procedure Laterality Date  . COLONOSCOPY  08/03/2004   Jenkins-numerous large diverticula in the descending, transverse, descending, and sigmoid colon. Otherwise normal exam.  . COLONOSCOPY  07/12/2012   RMR: External hemorrhoidal tag; multiple rectal and colonic polyps removed and/or treated as described above. Pancolonic diverticulosis. Bx-tubular adenomas, rectal hyperplastic polyp. next colonoscopy in 06/2015.  [name] COLONOSCOPY N/A 06/22/2016   Procedure: COLONOSCOPY;  Surgeon: Aviva Signs, MD;  [facility];  Service: Gastroenterology;  Laterality: N/A;  730  . CORONARY ANGIOPLASTY WITH STENT PLACEMENT    . CORONARY ARTERY BYPASS GRAFT  1996   LIMA to LAD, SVG to D2, SVG  to PDA, SVG to OM1 and OM2  . CORONARY STENT INTERVENTION N/A 12/10/2019   Procedure: CORONARY STENT INTERVENTION;  Surgeon: Burnell Blanks, MD;  Location: [facility] [facility];  Service: Cardiovascular;  Laterality: N/A;  . ESOPHAGOGASTRODUODENOSCOPY N/A 03/16/2013   Procedure: ESOPHAGOGASTRODUODENOSCOPY (EGD);  Surgeon: Daneil Dolin, MD;  [facility];  Service: Endoscopy;  Laterality: N/A;  12:00-moved to [place] notified pt  . ESOPHAGOGASTRODUODENOSCOPY N/A 03/06/2013   Procedure: ESOPHAGOGASTRODUODENOSCOPY (EGD);  Surgeon: Daneil Dolin, MD;  [facility];  Service: Endoscopy;  Laterality: N/A;  . HERNIA REPAIR    . INTRAVASCULAR ULTRASOUND/IVUS N/A 12/10/2019   Procedure: Intravascular Ultrasound/IVUS;  Surgeon: Burnell Blanks, MD;  Location: [facility];  Service: Cardiovascular;  Laterality: N/A;  . LEFT HEART CATH AND CORS/GRAFTS ANGIOGRAPHY N/A 12/10/2019   Procedure: LEFT HEART CATH AND CORS/GRAFTS ANGIOGRAPHY;  Surgeon: Burnell Blanks, MD;  Location: [place] [facility];  Service: Cardiovascular;  Laterality: N/A;  . LEFT HEART CATH AND CORS/GRAFTS ANGIOGRAPHY N/A 03/02/2021   Procedure: LEFT HEART CATH AND CORS/GRAFTS ANGIOGRAPHY;  Surgeon: Nelva Bush, MD;  Location: [place] [facility];  Service: Cardiovascular;  Laterality: N/A;    Current Medications: Current Meds  Medication Sig  . albuterol (PROVENTIL) (2.5 MG/3ML) 0.083% nebulizer solution Take 3 mLs (2.5 mg total) by nebulization every 6 (six) hours as needed for wheezing or shortness of breath. Patient will also need the nebulizer machine with this prescription.  [name] albuterol (VENTOLIN HFA) 108 (90 Base) MCG/ACT inhaler Inhale 2 puffs into the lungs every 6 (six) hours as needed for wheezing or shortness of breath.  . alfuzosin (UROXATRAL) 10 MG 24 hr tablet Take 1 tablet (10 mg total) by mouth daily with breakfast.  . aspirin EC 81 MG tablet Take 81 mg by mouth daily.  . budesonide-formoterol (SYMBICORT) 160-4.5 MCG/ACT inhaler Inhale 2 puffs into the lungs 2 (two) times daily.  . busPIRone (BUSPAR) 10 MG tablet Take 10 mg by mouth 2 (two) times daily.  . clopidogrel (PLAVIX) 75 MG tablet TAKE 1 TABLET BY MOUTH DAILY.  [name] doxazosin (CARDURA) 1 MG tablet Take 1 mg by mouth at bedtime.  . dutasteride (AVODART) 0.5 MG capsule Take 1 capsule (0.5 mg total) by mouth daily. In the afternoon  . escitalopram (LEXAPRO) 20 MG tablet Take 20 mg by mouth every morning.   . furosemide (LASIX) 40 MG tablet Take 1 tablet (40 mg  total) by mouth in the morning.  [name] guaiFENesin (MUCINEX) 600 MG 12 hr tablet Take 1 tablet (600 mg total) by mouth 2 (two) times daily.  . hydrOXYzine (ATARAX/VISTARIL) 25 MG tablet Take 25 mg by mouth 2 (two) times daily as needed for anxiety.  . isosorbide mononitrate (IMDUR) 30 MG 24 hr tablet Take 1 tablet (30 mg total) by mouth daily. (Patient taking differently: Take 90 mg by mouth daily.)  . isosorbide mononitrate (IMDUR) 60 MG 24 hr tablet Take 120 mg by mouth every morning.  [name] levothyroxine (SYNTHROID, LEVOTHROID) 50 MCG tablet Take 50 mcg by mouth every morning.  . memantine (NAMENDA) 10 MG tablet Take 10 mg by mouth 2 (two) times daily.   [name] MOVANTIK 25 MG TABS tablet Take 25 mg by mouth daily.  . nitroGLYCERIN (NITROSTAT) 0.4 MG SL tablet Place 1 tablet (0.4 mg total) under the tongue every 5 (five) minutes as needed for chest pain. Pt has bought nitro bottle from home and oxycodone 30 mg (  3 or 4 tabs) is in nitro bottle per wife Romualdo Prosise.  [name] oxycodone (ROXICODONE) 30 MG immediate release tablet Take 30 mg by mouth every 4 (four) hours as needed for pain. Take 1 tablet by mouth every 4 to 6 hours as needed for pain.  Max 8 /24 hours.  . pantoprazole (PROTONIX) 40 MG tablet Take 1 tablet (40 mg total) by mouth every morning.  . potassium chloride SA (K-DUR,KLOR-CON) 20 MEQ tablet Take 20 mEq by mouth daily.   . QUEtiapine (SEROQUEL) 25 MG tablet Take 25 mg by mouth at bedtime.  . tamsulosin (FLOMAX) 0.4 MG CAPS capsule Take 1 capsule (0.4 mg total) by mouth daily. To help you pee.  . Vitamin D, Ergocalciferol, (DRISDOL) 1.25 MG (50000 UNIT) CAPS capsule Take 50,000 Units by mouth once a week.  . zinc sulfate 220 (50 Zn) MG capsule Take 1 capsule (220 mg total) by mouth daily.     Allergies:   Patient has no known allergies.   Social History   Socioeconomic History  . Marital status: Married    Spouse name: Not on file  . Number of children: 3  . Years of education: Not on  file  . Highest education level: Not on file  Occupational History  . Occupation: retired    Fish farm manager: RETIRED  Tobacco Use  . Smoking status: Former Smoker    Packs/day: 2.00    Years: 40.00    Pack years: 80.00    Types: Cigarettes    Quit date: 12/27/1994    Years since quitting: 26.2  . Smokeless tobacco: Never Used  Vaping Use  . Vaping Use: Never used  Substance and Sexual Activity  . Alcohol use: No    Alcohol/week: 0.0 standard drinks  . Drug use: No  . Sexual activity: Not Currently  Other Topics Concern  . Not on file  Social History Narrative   Lives w/ ailing wife.   Son brings meals 3x per week   Social Determinants of Health   Financial Resource Strain: Not on file  Food Insecurity: Not on file  Transportation Needs: Not on file  Physical Activity: Not on file  Stress: Not on file  Social Connections: Not on file     Family History:  The patient's family history includes Heart attack in his mother; Heart disease in an other family member.   ROS:   Please see the history of present illness.    ROS All other systems reviewed and are negative.   PHYSICAL EXAM:   VS:  BP 126/72   Pulse 76   Ht 5\' 10"  (1.778 m)   Wt 215 lb (97.5 kg)   SpO2 98%   BMI 30.85 kg/m   Physical Exam  GEN: Obese, in no acute distress  Neck: no JVD, carotid bruits, or masses Cardiac:RRR; no murmurs, rubs, or gallops  Respiratory: Fine basilar crackles  GI: distended Ext: without cyanosis, clubbing, or edema, Good distal pulses bilaterally Neuro:  Alert and Oriented x 3 Psych: euthymic mood, full affect  Wt Readings from Last 3 Encounters:  03/23/21 215 lb (97.5 kg)  03/09/21 215 lb (97.5 kg)  03/02/21 215 lb (97.5 kg)      Studies/Labs Reviewed:   EKG:  EKG is not ordered today.    Recent Labs: 09/21/2020: ALT 22 02/10/2021: B Natriuretic Peptide 153.0 02/25/2021: BUN 21; Creatinine, Ser 1.03; Hemoglobin 12.7; Platelets 206; Potassium 4.0; Sodium 140   Lipid  Panel    Component Value  Date/Time   CHOL 143 12/11/2019 0438   TRIG 83 02/13/2020 0229   HDL 37 (L) 12/11/2019 0438   CHOLHDL 3.9 12/11/2019 0438   VLDL 29 12/11/2019 0438   LDLCALC 77 12/11/2019 0438    Additional studies/ records that were reviewed today include:  Cardiac cath 03/02/2021 Conclusions: 1. Severe native coronary artery disease. Including 99% mid LAD stenosis with competitive flow from LIMA-LAD, as well as chronic total occlusions of proximal LCx and mid RCA. 2. Widely patent LMCA stent. 3. Widely patent LIMA-LAD. 4. Patent SVG-OM1 with minimal liminal irregularities.  Jump limb to OM2 is chronically occluded. 5. Chronically occlused SVG-D2 and SVG-rPDA. 6. Mildly reduced left ventricular contraction with global hypokinesis (LVEF 45-50%). 7. Moderately elevated left ventricular filling pressure (LVEDP 25-30 mmHg).   Recommendations: 1. Escalate furosemide to 40 mg daily. 2. Consider further escalation of antianginal therapy.  No interventional targets identified on today's catheterization. 3. Consider echo to reassess LVEF and exclude other structural abnormalities that could be contributing to symptoms. 4. Continue aggressive secondary prevention of coronary artery disease.   Nelva Bush, MD [facility]    Risk Assessment/Calculations:         ASSESSMENT:    1. Coronary artery disease involving coronary bypass graft of native heart without angina pectoris   2. Ischemic cardiomyopathy   3. Essential hypertension   4. Hyperlipidemia, unspecified hyperlipidemia type      PLAN:  In order of problems listed above:  CAD status post CABG 1996, DES x2 SVG to the PDA 2012 distal disease managed medically, DES to the distal left main 11/2019, 2 out of 5 grafts patent, recent cath for increasing angina 03/02/2021 see above for details medical therapy recommended elevated LVEDP and mildly reduced LVEF 45 to 50% Lasix increased to 40 mg daily and recommend  follow-up echo.  Still having some angina but it has improved on the increased Imdur.  We will add Ranexa 500 mg twice daily.  Reviewed cardiac catheterization with patient in detail.  Ischemic cardiomyopathy ejection fraction 45 to 50% at time of cath we will recheck echo. Patient has not noticed much difference on the increase Lasix.  He is getting a lot of salt in his diet and eating out daily.  2 g sodium diet.  We will hold off on ordering echo at this time.  Essential hypertension blood pressure controlled  Hyperlipidemia continue Crestor    Shared Decision Making/Informed Consent        Medication Adjustments/Labs and Tests Ordered: Current medicines are reviewed at length with the patient today.  Concerns regarding medicines are outlined above.  Medication changes, Labs and Tests ordered today are listed in the Patient Instructions below. There are no Patient Instructions on file for this visit.   Sumner Boast, PA-C  03/23/2021 1:53 PM    [facility] [place], [place], [place]  [address] Phone: [phone]; Fax: [phone]

## 2021-03-18 DIAGNOSIS — R339 Retention of urine, unspecified: Secondary | ICD-10-CM | POA: Diagnosis not present

## 2021-03-23 ENCOUNTER — Encounter: Payer: Self-pay | Admitting: Physician Assistant

## 2021-03-23 ENCOUNTER — Other Ambulatory Visit: Payer: Self-pay

## 2021-03-23 ENCOUNTER — Ambulatory Visit: Payer: PPO | Admitting: Physician Assistant

## 2021-03-23 VITALS — BP 126/72 | HR 76 | Ht 70.0 in | Wt 215.0 lb

## 2021-03-23 DIAGNOSIS — I10 Essential (primary) hypertension: Secondary | ICD-10-CM | POA: Diagnosis not present

## 2021-03-23 DIAGNOSIS — E785 Hyperlipidemia, unspecified: Secondary | ICD-10-CM

## 2021-03-23 DIAGNOSIS — I25810 Atherosclerosis of coronary artery bypass graft(s) without angina pectoris: Secondary | ICD-10-CM

## 2021-03-23 DIAGNOSIS — I255 Ischemic cardiomyopathy: Secondary | ICD-10-CM | POA: Diagnosis not present

## 2021-03-23 MED ORDER — RANOLAZINE ER 500 MG PO TB12: 500.0000 mg | ORAL_TABLET | Freq: Two times a day (BID) | ORAL | 11 refills | Status: DC

## 2021-03-23 NOTE — Patient Instructions (Signed)
Medication Instructions:  Your physician has recommended you make the following change in your medication:   Start Ranexa 500 mg Two Times Daily   *If you need a refill on your cardiac medications before your next appointment, please call your pharmacy*   Lab Work: NONE   If you have labs (blood work) drawn today and your tests are completely normal, you will receive your results only by: [name] MyChart Message (if you have MyChart) OR . A paper copy in the mail If you have any lab test that is abnormal or we need to change your treatment, we will call you to review the results.   Testing/Procedures: NONE   Follow-Up: At [facility], you and your health needs are our priority.  As part of our continuing mission to provide you with exceptional heart care, we have created designated Provider Care Teams.  These Care Teams include your primary Cardiologist (physician) and Advanced Practice Providers (APPs -  Physician Assistants and Nurse Practitioners) who all work together to provide you with the care you need, when you need it.  We recommend signing up for the patient portal called "MyChart".  Sign up information is provided on this After Visit Summary.  MyChart is used to connect with patients for Virtual Visits (Telemedicine).  Patients are able to view lab/test results, encounter notes, upcoming appointments, etc.  Non-urgent messages can be sent to your provider as well.   To learn more about what you can do with MyChart, go to [email or web address].    Your next appointment:   3 month(s)  The format for your next appointment:   In Person  Provider:   Rozann Lesches, MD   Other Instructions Thank you for choosing [facility]!  Two Gram Sodium Diet 2000 mg  What is Sodium? Sodium is a mineral found naturally in many foods. The most significant source of sodium in the diet is table salt, which is about 40% sodium.  Processed, convenience, and preserved foods also  contain a large amount of sodium.  The body needs only 500 mg of sodium daily to function,  A normal diet provides more than enough sodium even if you do not use salt.  Why Limit Sodium? A build up of sodium in the body can cause thirst, increased blood pressure, shortness of breath, and water retention.  Decreasing sodium in the diet can reduce edema and risk of heart attack or stroke associated with high blood pressure.  Keep in mind that there are many other factors involved in these health problems.  Heredity, obesity, lack of exercise, cigarette smoking, stress and what you eat all play a role.  General Guidelines:  Do not add salt at the table or in cooking.  One teaspoon of salt contains over 2 grams of sodium.  Read food labels  Avoid processed and convenience foods  Ask your dietitian before eating any foods not dicussed in the menu planning guidelines  Consult your physician if you wish to use a salt substitute or a sodium containing medication such as antacids.  Limit milk and milk products to 16 oz (2 cups) per day.  Shopping Hints:  READ LABELS!! "Dietetic" does not necessarily mean low sodium.  Salt and other sodium ingredients are often added to foods during processing.   Menu Planning Guidelines Food Group Choose More Often Avoid  Beverages (see also the milk group All fruit juices, low-sodium, salt-free vegetables juices, low-sodium carbonated beverages Regular vegetable or tomato juices, commercially softened water used  for drinking or cooking  Breads and Cereals Enriched white, wheat, rye and pumpernickel bread, hard rolls and dinner rolls; muffins, cornbread and waffles; most dry cereals, cooked cereal without added salt; unsalted crackers and breadsticks; low sodium or homemade bread crumbs Bread, rolls and crackers with salted tops; quick breads; instant hot cereals; pancakes; commercial bread stuffing; self-rising flower and biscuit mixes; regular bread crumbs or  cracker crumbs  Desserts and Sweets Desserts and sweets mad with mild should be within allowance Instant pudding mixes and cake mixes  Fats Butter or margarine; vegetable oils; unsalted salad dressings, regular salad dressings limited to 1 Tbs; light, sour and heavy cream Regular salad dressings containing bacon fat, bacon bits, and salt pork; snack dips made with instant soup mixes or processed cheese; salted nuts  Fruits Most fresh, frozen and canned fruits Fruits processed with salt or sodium-containing ingredient (some dried fruits are processed with sodium sulfites        Vegetables Fresh, frozen vegetables and low- sodium canned vegetables Regular canned vegetables, sauerkraut, pickled vegetables, and others prepared in brine; frozen vegetables in sauces; vegetables seasoned with ham, bacon or salt pork  Condiments, Sauces, Miscellaneous  Salt substitute with physician's approval; pepper, herbs, spices; vinegar, lemon or lime juice; hot pepper sauce; garlic powder, onion powder, low sodium soy sauce (1 Tbs.); low sodium condiments (ketchup, chili sauce, mustard) in limited amounts (1 tsp.) fresh ground horseradish; unsalted tortilla chips, pretzels, potato chips, popcorn, salsa (1/4 cup) Any seasoning made with salt including garlic salt, celery salt, onion salt, and seasoned salt; sea salt, rock salt, kosher salt; meat tenderizers; monosodium glutamate; mustard, regular soy sauce, barbecue, sauce, chili sauce, teriyaki sauce, steak sauce, Worcestershire sauce, and most flavored vinegars; canned gravy and mixes; regular condiments; salted snack foods, olives, picles, relish, horseradish sauce, catsup   Food preparation: Try these seasonings Meats:    Pork Sage, onion Serve with applesauce  Chicken Poultry seasoning, thyme, parsley Serve with cranberry sauce  Lamb Curry powder, rosemary, garlic, thyme Serve with mint sauce or jelly  Veal Marjoram, basil Serve with current jelly, cranberry  sauce  Beef Pepper, bay leaf Serve with dry mustard, unsalted chive butter  Fish [place] leaf, dill Serve with unsalted lemon butter, unsalted parsley butter  Vegetables:    Asparagus Lemon juice   Broccoli Lemon juice   Carrots Mustard dressing parsley, mint, nutmeg, glazed with unsalted butter and sugar   Green beans Marjoram, lemon juice, nutmeg,dill seed   Tomatoes Basil, marjoram, onion   Spice /blend for [employer]" 4 tsp ground thyme 1 tsp ground sage 3 tsp ground rosemary 4 tsp ground marjoram   Test your knowledge 1. A product that says "Salt Free" may still contain sodium. True or False 2. Garlic Powder and Hot Pepper Sauce an be used as alternative seasonings.True or False 3. Processed foods have more sodium than fresh foods.  True or False 4. Canned Vegetables have less sodium than froze True or False  WAYS TO DECREASE YOUR SODIUM INTAKE 1. Avoid the use of added salt in cooking and at the table.  Table salt (and other prepared seasonings which contain salt) is probably one of the greatest sources of sodium in the diet.  Unsalted foods can gain flavor from the sweet, sour, and butter taste sensations of herbs and spices.  Instead of using salt for seasoning, try the following seasonings with the foods listed.  Remember: how you use them to enhance natural food flavors is limited only by  your creativity... Allspice-Meat, fish, eggs, fruit, peas, red and yellow vegetables Almond Extract-Fruit baked goods Anise Seed-Sweet breads, fruit, carrots, beets, cottage cheese, cookies (tastes like licorice) Basil-Meat, fish, eggs, vegetables, rice, vegetables salads, soups, sauces Bay Leaf-Meat, fish, stews, poultry Burnet-Salad, vegetables (cucumber-like flavor) Caraway Seed-Bread, cookies, cottage cheese, meat, vegetables, cheese, rice Cardamon-Baked goods, fruit, soups Celery Powder or seed-Salads, salad dressings, sauces, meatloaf, soup, bread.Do not use  celery salt Chervil-Meats,  salads, fish, eggs, vegetables, cottage cheese (parsley-like flavor) Chili Power-Meatloaf, chicken cheese, corn, eggplant, egg dishes Chives-Salads cottage cheese, egg dishes, soups, vegetables, sauces Cilantro-Salsa, casseroles Cinnamon-Baked goods, fruit, pork, lamb, chicken, carrots Cloves-Fruit, baked goods, fish, pot roast, green beans, beets, carrots Coriander-Pastry, cookies, meat, salads, cheese (lemon-orange flavor) Cumin-Meatloaf, fish,cheese, eggs, cabbage,fruit pie (caraway flavor) [employer], fruit, eggs, fish, poultry, cottage cheese, vegetables Dill Seed-Meat, cottage cheese, poultry, vegetables, fish, salads, bread Fennel Seed-Bread, cookies, apples, pork, eggs, fish, beets, cabbage, cheese, Licorice-like flavor Garlic-(buds or powder) Salads, meat, poultry, fish, bread, butter, vegetables, potatoes.Do not  use garlic salt Ginger-Fruit, vegetables, baked goods, meat, fish, poultry Horseradish Root-Meet, vegetables, butter Lemon Juice or Extract-Vegetables, fruit, tea, baked goods, fish salads Mace-Baked goods fruit, vegetables, fish, poultry (taste like nutmeg) Maple Extract-Syrups Marjoram-Meat, chicken, fish, vegetables, breads, green salads (taste like Sage) Mint-Tea, lamb, sherbet, vegetables, desserts, carrots, cabbage Mustard, Dry or Seed-Cheese, eggs, meats, vegetables, poultry Nutmeg-Baked goods, fruit, chicken, eggs, vegetables, desserts Onion Powder-Meat, fish, poultry, vegetables, cheese, eggs, bread, rice salads (Do not use   Onion salt) Orange Extract-Desserts, baked goods Oregano-Pasta, eggs, cheese, onions, pork, lamb, fish, chicken, vegetables, green salads Paprika-Meat, fish, poultry, eggs, cheese, vegetables Parsley Flakes-Butter, vegetables, meat fish, poultry, eggs, bread, salads (certain forms may   Contain sodium Pepper-Meat fish, poultry, vegetables, eggs Peppermint Extract-Desserts, baked goods Poppy Seed-Eggs, bread, cheese, fruit  dressings, baked goods, noodles, vegetables, cottage  [employer], poultry, meat, fish, cauliflower, turnips,eggs bread Saffron-Rice, bread, veal, chicken, fish, eggs Sage-Meat, fish, poultry, onions, eggplant, tomateos, pork, stews Savory-Eggs, salads, poultry, meat, rice, vegetables, soups, pork Tarragon-Meat, poultry, fish, eggs, butter, vegetables (licorice-like flavor)  Thyme-Meat, poultry, fish, eggs, vegetables, (clover-like flavor), sauces, soups Tumeric-Salads, butter, eggs, fish, rice, vegetables (saffron-like flavor) Vanilla Extract-Baked goods, candy Vinegar-Salads, vegetables, meat marinades Walnut Extract-baked goods, candy  2. Choose your Foods Wisely   The following is a list of foods to avoid which are high in sodium:  Meats-Avoid all smoked, canned, salt cured, dried and kosher meat and fish as well as Anchovies   [employer] meats:Bologna, Liverwurst, Pastrami Canned meat or fish  Marinated herring Caviar    Pepperoni Corned Beef   Pizza Dried chipped beef  Salami Frozen breaded fish or meat Salt pork Frankfurters or hot dogs  Sardines Gefilte fish   Sausage Ham (boiled ham, Proscuitto Smoked butt    spiced ham)   Spam      TV Dinners Vegetables Canned vegetables (Regular) Relish Canned mushrooms  Sauerkraut Olives    Tomato juice Pickles  Bakery and Dessert Products Canned puddings  Cream pies Cheesecake   Decorated cakes Cookies  Beverages/Juices Tomato juice, regular  Gatorade   V-8 vegetable juice, regular  Breads and Cereals Biscuit mixes   Salted potato chips, corn chips, pretzels Bread stuffing mixes  Salted crackers and rolls Pancake and waffle mixes Self-rising flour  Seasonings Accent    Meat sauces Barbecue sauce  Meat tenderizer Catsup    Monosodium glutamate (MSG) Celery salt   Onion salt Chili sauce  Prepared mustard Garlic salt   Salt, seasoned salt, sea salt Gravy mixes   Soy  sauce Horseradish   Steak sauce Ketchup   Tartar sauce Lite salt    Teriyaki sauce Marinade mixes   Worcestershire sauce  Others Baking powder   Cocoa and cocoa mixes Baking soda   Commercial casserole mixes Candy-caramels, chocolate  Dehydrated soups    Bars, fudge,nougats  Instant rice and pasta mixes Canned broth or soup  Maraschino cherries Cheese, aged and processed cheese and cheese spreads  Learning Assessment Quiz  Indicated T (for True) or F (for False) for each of the following statements:  1. _____ Fresh fruits and vegetables and unprocessed grains are generally low in sodium 2. _____ Water may contain a considerable amount of sodium, depending on the source 3. _____ You can always tell if a food is high in sodium by tasting it 4. _____ Certain laxatives my be high in sodium and should be avoided unless prescribed   by a physician or pharmacist 5. _____ Salt substitutes may be used freely by anyone on a sodium restricted diet 6. _____ Sodium is present in table salt, food additives and as a natural component of   most foods 7. _____ Table salt is approximately 90% sodium 8. _____ Limiting sodium intake may help prevent excess fluid accumulation in the body 9. _____ On a sodium-restricted diet, seasonings such as bouillon soy sauce, and    cooking wine should be used in place of table salt 10. _____ On an ingredient list, a product which lists monosodium glutamate as the first   ingredient is an appropriate food to include on a low sodium diet  Circle the best answer(s) to the following statements (Hint: there may be more than one correct answer)  11. On a low-sodium diet, some acceptable snack items are:    A. Olives  F. Bean dip   K. Grapefruit juice    B. Salted Pretzels G. Commercial Popcorn   L. Canned peaches    C. Carrot Sticks  H. Bouillon   M. Unsalted nuts   D. [place] fries  I. Peanut butter crackers N. Salami   E. Sweet pickles J. Tomato Juice   O.  Pizza  12.  Seasonings that may be used freely on a reduced - sodium diet include   A. Lemon wedges F.Monosodium glutamate K. Celery seed    B.Soysauce   G. Pepper   L. Mustard powder   C. Sea salt  H. Cooking wine  M. Onion flakes   D. Vinegar  E. Prepared horseradish N. Salsa   E. Sage   J. Worcestershire sauce  O. Chutney

## 2021-03-25 DIAGNOSIS — G894 Chronic pain syndrome: Secondary | ICD-10-CM | POA: Diagnosis not present

## 2021-03-27 DIAGNOSIS — F419 Anxiety disorder, unspecified: Secondary | ICD-10-CM | POA: Diagnosis not present

## 2021-03-27 DIAGNOSIS — G4733 Obstructive sleep apnea (adult) (pediatric): Secondary | ICD-10-CM | POA: Diagnosis not present

## 2021-03-27 DIAGNOSIS — J9601 Acute respiratory failure with hypoxia: Secondary | ICD-10-CM | POA: Diagnosis not present

## 2021-03-27 DIAGNOSIS — I2510 Atherosclerotic heart disease of native coronary artery without angina pectoris: Secondary | ICD-10-CM | POA: Diagnosis not present

## 2021-03-27 DIAGNOSIS — I110 Hypertensive heart disease with heart failure: Secondary | ICD-10-CM | POA: Diagnosis not present

## 2021-03-27 DIAGNOSIS — G9341 Metabolic encephalopathy: Secondary | ICD-10-CM | POA: Diagnosis not present

## 2021-03-27 DIAGNOSIS — I5032 Chronic diastolic (congestive) heart failure: Secondary | ICD-10-CM | POA: Diagnosis not present

## 2021-03-27 DIAGNOSIS — N179 Acute kidney failure, unspecified: Secondary | ICD-10-CM | POA: Diagnosis not present

## 2021-03-27 DIAGNOSIS — G309 Alzheimer's disease, unspecified: Secondary | ICD-10-CM | POA: Diagnosis not present

## 2021-03-27 DIAGNOSIS — F32A Depression, unspecified: Secondary | ICD-10-CM | POA: Diagnosis not present

## 2021-03-27 DIAGNOSIS — G894 Chronic pain syndrome: Secondary | ICD-10-CM | POA: Diagnosis not present

## 2021-03-27 DIAGNOSIS — J441 Chronic obstructive pulmonary disease with (acute) exacerbation: Secondary | ICD-10-CM | POA: Diagnosis not present

## 2021-03-27 DIAGNOSIS — F0280 Dementia in other diseases classified elsewhere without behavioral disturbance: Secondary | ICD-10-CM | POA: Diagnosis not present

## 2021-04-13 DIAGNOSIS — U071 COVID-19: Secondary | ICD-10-CM | POA: Diagnosis not present

## 2021-04-13 DIAGNOSIS — N179 Acute kidney failure, unspecified: Secondary | ICD-10-CM | POA: Diagnosis not present

## 2021-04-13 DIAGNOSIS — J1282 Pneumonia due to coronavirus disease 2019: Secondary | ICD-10-CM | POA: Diagnosis not present

## 2021-04-13 DIAGNOSIS — G9341 Metabolic encephalopathy: Secondary | ICD-10-CM | POA: Diagnosis not present

## 2021-05-01 ENCOUNTER — Other Ambulatory Visit (HOSPITAL_COMMUNITY): Payer: Self-pay | Admitting: Internal Medicine

## 2021-05-01 ENCOUNTER — Other Ambulatory Visit: Payer: Self-pay

## 2021-05-01 ENCOUNTER — Ambulatory Visit (HOSPITAL_COMMUNITY)
Admission: RE | Admit: 2021-05-01 | Discharge: 2021-05-01 | Disposition: A | Discharge status: Home / Self Care | Payer: PPO | Source: Ambulatory Visit | Attending: Internal Medicine | Admitting: Internal Medicine

## 2021-05-01 DIAGNOSIS — Z951 Presence of aortocoronary bypass graft: Secondary | ICD-10-CM | POA: Diagnosis not present

## 2021-05-01 DIAGNOSIS — R0600 Dyspnea, unspecified: Secondary | ICD-10-CM

## 2021-05-01 DIAGNOSIS — R918 Other nonspecific abnormal finding of lung field: Secondary | ICD-10-CM | POA: Diagnosis not present

## 2021-05-01 DIAGNOSIS — Z6830 Body mass index (BMI) 30.0-30.9, adult: Secondary | ICD-10-CM | POA: Diagnosis not present

## 2021-05-01 DIAGNOSIS — G894 Chronic pain syndrome: Secondary | ICD-10-CM | POA: Diagnosis not present

## 2021-05-01 DIAGNOSIS — J449 Chronic obstructive pulmonary disease, unspecified: Secondary | ICD-10-CM | POA: Diagnosis not present

## 2021-05-01 DIAGNOSIS — M1991 Primary osteoarthritis, unspecified site: Secondary | ICD-10-CM | POA: Diagnosis not present

## 2021-05-04 NOTE — Progress Notes (Signed)
[name] Note  05/05/2021     CHIEF COMPLAINT Patient presents for Retina Follow Up   HISTORY OF PRESENT ILLNESS: Dustin [name] is a 87 y.o. male who presents to the clinic today for:   HPI    Retina Follow Up    Patient presents with  Other.  In right eye.  This started 5 months ago.  Since onset it is gradually worsening.  I, the attending physician,  performed the HPI with the patient and updated documentation appropriately.          Comments    Pt here for 5 month retinal follow up CME OD. Pt was meant to be seen @ the end of Dec 2021 but hasnt been back since 11/26/20. Pt states he has been prolonged due to illness and transportation. He reports a film over his vision but isnt sure which eye. Pt wanting a letter for the [facility] that he is able to drive and not vision impaired.        Last edited by Bernarda Caffey, MD on 05/06/2021  1:05 PM. (History)    pt states is delayed to follow up since December 2021 due to being sick, pt states he is still using the drops he was given at that time, he states he has a hard time focusing, like he has a film over his eye  HISTORICAL INFORMATION:   Selected notes from the MEDICAL RECORD NUMBER Pt of Dr. Zigmund Daniel LEE: 06.29.20 BCVA: OD 20/20, OS 20/30 Ocular Hx- CME OD, EMR OD, HTN Ret. OU, Choroidal Nevus OS, NS OS, ARMD OS, Pseudo OD    CURRENT MEDICATIONS: No current outpatient medications on file. (Ophthalmic Drugs)   No current facility-administered medications for this visit. (Ophthalmic Drugs)   Current Outpatient Medications (Other)  Medication Sig  . albuterol (PROVENTIL) (2.5 MG/3ML) 0.083% nebulizer solution Take 3 mLs (2.5 mg total) by nebulization every 6 (six) hours as needed for wheezing or shortness of breath. Patient will also need the nebulizer machine with this prescription.  [name] albuterol (VENTOLIN HFA) 108 (90 Base) MCG/ACT inhaler Inhale 2 puffs into the lungs every 6 (six) hours as  needed for wheezing or shortness of breath.  . alfuzosin (UROXATRAL) 10 MG 24 hr tablet Take 1 tablet (10 mg total) by mouth daily with breakfast.  . aspirin EC 81 MG tablet Take 81 mg by mouth daily.  . budesonide-formoterol (SYMBICORT) 160-4.5 MCG/ACT inhaler Inhale 2 puffs into the lungs 2 (two) times daily.  . busPIRone (BUSPAR) 10 MG tablet Take 10 mg by mouth 2 (two) times daily.  . clopidogrel (PLAVIX) 75 MG tablet TAKE 1 TABLET BY MOUTH DAILY.  [name] doxazosin (CARDURA) 1 MG tablet Take 1 mg by mouth at bedtime.  . dutasteride (AVODART) 0.5 MG capsule Take 1 capsule (0.5 mg total) by mouth daily. In the afternoon  . escitalopram (LEXAPRO) 20 MG tablet Take 20 mg by mouth every morning.   . furosemide (LASIX) 40 MG tablet Take 1 tablet (40 mg total) by mouth in the morning.  [name] guaiFENesin (MUCINEX) 600 MG 12 hr tablet Take 1 tablet (600 mg total) by mouth 2 (two) times daily.  . hydrOXYzine (ATARAX/VISTARIL) 25 MG tablet Take 25 mg by mouth 2 (two) times daily as needed for anxiety.  . isosorbide mononitrate (IMDUR) 30 MG 24 hr tablet Take 1 tablet (30 mg total) by mouth daily. (Patient taking differently: Take 90 mg by mouth daily.)  . isosorbide  mononitrate (IMDUR) 60 MG 24 hr tablet Take 120 mg by mouth every morning.  [name] levothyroxine (SYNTHROID, LEVOTHROID) 50 MCG tablet Take 50 mcg by mouth every morning.  . memantine (NAMENDA) 10 MG tablet Take 10 mg by mouth 2 (two) times daily.   [name] MOVANTIK 25 MG TABS tablet Take 25 mg by mouth daily.  . nitroGLYCERIN (NITROSTAT) 0.4 MG SL tablet Place 1 tablet (0.4 mg total) under the tongue every 5 (five) minutes as needed for chest pain. Pt has bought nitro bottle from home and oxycodone 30 mg ( 3 or 4 tabs) is in nitro bottle per wife Devaun Hernandez.  [name] oxycodone (ROXICODONE) 30 MG immediate release tablet Take 30 mg by mouth every 4 (four) hours as needed for pain. Take 1 tablet by mouth every 4 to 6 hours as needed for pain.  Max 8 /24 hours.  .  pantoprazole (PROTONIX) 40 MG tablet Take 1 tablet (40 mg total) by mouth every morning.  . potassium chloride SA (K-DUR,KLOR-CON) 20 MEQ tablet Take 20 mEq by mouth daily.   . QUEtiapine (SEROQUEL) 25 MG tablet Take 25 mg by mouth at bedtime.  . ranolazine (RANEXA) 500 MG 12 hr tablet Take 1 tablet (500 mg total) by mouth 2 (two) times daily.  . rosuvastatin (CRESTOR) 40 MG tablet Take 1 tablet (40 mg total) by mouth daily at 6 PM.  . tamsulosin (FLOMAX) 0.4 MG CAPS capsule Take 1 capsule (0.4 mg total) by mouth daily. To help you pee.  . Vitamin D, Ergocalciferol, (DRISDOL) 1.25 MG (50000 UNIT) CAPS capsule Take 50,000 Units by mouth once a week.  . zinc sulfate 220 (50 Zn) MG capsule Take 1 capsule (220 mg total) by mouth daily.   No current facility-administered medications for this visit. (Other)      REVIEW OF SYSTEMS: ROS    Positive for: Eyes   Negative for: Constitutional, Gastrointestinal, Neurological, Skin, Genitourinary, Musculoskeletal, HENT, Endocrine, Cardiovascular, Respiratory, Psychiatric, Allergic/Imm, Heme/Lymph   Last edited by Kingsley Spittle, COT on 05/05/2021  1:16 PM. (History)       ALLERGIES No Known Allergies  PAST MEDICAL HISTORY Past Medical History:  Diagnosis Date  . Alzheimer disease ([place])   . Anxiety   . Arthritis   . Atrophic gastritis    a. By EGD 02/2013.  . Carotid artery disease ([place])    a. mild-mod plaque, <50% stenosis bilat by duplex 2018.  [name] Coronary atherosclerosis of native coronary artery    a. Multivessel s/p CABG 1996. b. s/p DES x 2 SVG to PDA 8/12 with distal disease managed medically.  . DDD (degenerative disc disease)    Chronic back pain  . Enlarged prostate   . Essential hypertension   . Hematuria   . Hypothyroidism   . LBBB (left bundle branch block)   . MI (myocardial infarction) ([place])   . Mixed hyperlipidemia   . OA (osteoarthritis)   . OSA (obstructive sleep apnea)   . Pneumonia due to COVID-19 virus     February 2021  . Sinus bradycardia    a. Aricept and BB discontinued due to this.   Past Surgical History:  Procedure Laterality Date  . COLONOSCOPY  08/03/2004   Jenkins-numerous large diverticula in the descending, transverse, descending, and sigmoid colon. Otherwise normal exam.  . COLONOSCOPY  07/12/2012   RMR: External hemorrhoidal tag; multiple rectal and colonic polyps removed and/or treated as described above. Pancolonic diverticulosis. Bx-tubular adenomas, rectal hyperplastic polyp. next colonoscopy in 06/2015.  [name]  COLONOSCOPY N/A 06/22/2016   Procedure: COLONOSCOPY;  Surgeon: Aviva Signs, MD;  [facility];  Service: Gastroenterology;  Laterality: N/A;  730  . CORONARY ANGIOPLASTY WITH STENT PLACEMENT    . CORONARY ARTERY BYPASS GRAFT  1996   LIMA to LAD, SVG to D2, SVG to PDA, SVG to OM1 and OM2  . CORONARY STENT INTERVENTION N/A 12/10/2019   Procedure: CORONARY STENT INTERVENTION;  Surgeon: Burnell Blanks, MD;  Location: [place] [facility];  Service: Cardiovascular;  Laterality: N/A;  . ESOPHAGOGASTRODUODENOSCOPY N/A 03/16/2013   Procedure: ESOPHAGOGASTRODUODENOSCOPY (EGD);  Surgeon: Daneil Dolin, MD;  [facility]: [facility];  Service: Endoscopy;  Laterality: N/A;  12:00-moved to [place] notified pt  . ESOPHAGOGASTRODUODENOSCOPY N/A 03/06/2013   Procedure: ESOPHAGOGASTRODUODENOSCOPY (EGD);  Surgeon: Daneil Dolin, MD;  [facility]: [facility];  Service: Endoscopy;  Laterality: N/A;  . HERNIA REPAIR    . INTRAVASCULAR ULTRASOUND/IVUS N/A 12/10/2019   Procedure: Intravascular Ultrasound/IVUS;  Surgeon: Burnell Blanks, MD;  Location: [place] [facility];  Service: Cardiovascular;  Laterality: N/A;  . LEFT HEART CATH AND CORS/GRAFTS ANGIOGRAPHY N/A 12/10/2019   Procedure: LEFT HEART CATH AND CORS/GRAFTS ANGIOGRAPHY;  Surgeon: Burnell Blanks, MD;  Location: [place] [facility];  Service: Cardiovascular;  Laterality: N/A;  . LEFT HEART CATH AND  CORS/GRAFTS ANGIOGRAPHY N/A 03/02/2021   Procedure: LEFT HEART CATH AND CORS/GRAFTS ANGIOGRAPHY;  Surgeon: Nelva Bush, MD;  Location: [place] [facility];  Service: Cardiovascular;  Laterality: N/A;    FAMILY HISTORY Family History  Problem Relation Age of Onset  . Heart disease Other   . Heart attack Mother   . Colon cancer Neg Hx     SOCIAL HISTORY Social History   Tobacco Use  . Smoking status: Former Smoker    Packs/day: 2.00    Years: 40.00    Pack years: 80.00    Types: Cigarettes    Quit date: 12/27/1994    Years since quitting: 26.3  . Smokeless tobacco: Never Used  Vaping Use  . Vaping Use: Never used  Substance Use Topics  . Alcohol use: No    Alcohol/week: 0.0 standard drinks  . Drug use: No         OPHTHALMIC EXAM:  Base Eye Exam    Visual Acuity (Snellen - Linear)      Right Left   Dist [place] 20/25 20/30 -1   Dist ph [place] NI NI       Tonometry (Tonopen, 1:25 PM)      Right Left   Pressure 13 13       Pupils      Dark Light Shape React APD   Right 4 3 Round Sluggish None   Left 4 3 Round Sluggish None       Visual Fields (Counting fingers)      Left Right    Full Full       Extraocular Movement      Right Left    Full, Ortho Full, Ortho       Neuro/Psych    Oriented x3: Yes   Mood/Affect: Normal        Slit Lamp and Fundus Exam    Slit Lamp Exam      Right Left   Lids/Lashes Dermatochalasis - upper lid, Meibomian gland dysfunction Dermatochalasis - upper lid, Meibomian gland dysfunction   Conjunctiva/Sclera White and quiet White and quiet   Cornea arcus, 1-2+ inferior Punctate epithelial erosions, well healed  cataract wound arcus, 3+ Punctate epithelial erosions, tear film debris   Anterior Chamber Deep and quiet Deep and quiet   Iris Round and dilated Round and moderately dilated to 5.93mm   Lens PC IOL in good position 3+ Nuclear sclerosis with brunescence, 3+ Cortical cataract   Vitreous Vitreous syneresis, Posterior vitreous  detachment, vitreous condensations Vitreous syneresis, Posterior vitreous detachment       Fundus Exam      Right Left   Disc Mild Pallor, Sharp rim Pink and Sharp   C/D Ratio 0.6 0.5   Macula Flat, Blunted foveal reflex, +ERM, stable improvement in trace Cystic changes - improved, RPE mottling Flat, Blunted foveal reflex, RPE mottling, clumping and early atrophy   Vessels Vascular attenuation, mild tortuousity, AV crossing changes Vascular attenuation, mild tortuousity   Periphery Attached, mild reticular degeneration, No heme  Attached, amelonotic choroidal nevus along distal ST arcades, reticular degeneration, No heme           IMAGING AND PROCEDURES  Imaging and Procedures for 05/05/2021  OCT, Retina - OU - Both Eyes       Right Eye Quality was good. Central Foveal Thickness: 320. Progression has been stable. Findings include epiretinal membrane, no SRF, normal foveal contour, macular pucker, no IRF (interval improvement in IRF -- just trace cystic changes remaining; Persistent ERM).   Left Eye Quality was good. Central Foveal Thickness: 282. Progression has been stable. Findings include intraretinal hyper-reflective material, abnormal foveal contour, no IRF, no SRF, epiretinal membrane, retinal drusen  (Focal ellipsoid disruption with IRHM IN fovea -- persistent).   Notes *Images captured and stored on drive  Diagnosis / Impression:  OD: ERM; Interval improvmenent in cystic changes OS: nonexudative ARMD w/ Focal ellipsoid disruption with IRHM IN fovea -- persistent  Clinical management:  See below  Abbreviations: NFP - Normal foveal profile. CME - cystoid macular edema. PED - pigment epithelial detachment. IRF - intraretinal fluid. SRF - subretinal fluid. EZ - ellipsoid zone. ERM - epiretinal membrane. ORA - outer retinal atrophy. ORT - outer retinal tubulation. SRHM - subretinal hyper-reflective material. IRHM - intraretinal hyper-reflective material                ASSESSMENT/PLAN:    ICD-10-CM   1. Cystoid macular edema of right eye  H35.351   2. Retinal edema  H35.81 OCT, Retina - OU - Both Eyes  3. Intermediate stage nonexudative age-related macular degeneration of left eye  H35.3122   4. Nevus of choroid of left eye  D31.32   5. Epiretinal membrane (ERM) of right eye  H35.371   6. Essential hypertension  I10   7. Hypertensive retinopathy of both eyes  H35.033   8. Pseudophakia of right eye  Z96.1   9. Combined forms of age-related cataract of left eye  H25.812     1,2. CME OD  - Zigmund Daniel pt, lost to f/u since June 2020  - lost to f/u w/ [facility] since Dec 2021  - hx of RBB kenalog 08.15.18  - was on Prolensa QHS OD for maintenance, but has been out of drops for months  - restarted Prolensa and PF QID OD 08.02.21   - OCT shows stable improvement in IRF/cystic changes -- on PF and Prolensa BID OD  - BCVA stable at 20/25  - decrease Prolensa and PF to QDaily OD  - f/u 4 weeks, DFE, OCT  3. Epiretinal membrane, right eye  - mild ERM - BCVA 20/25 - asymptomatic, no metamorphopsia -  no indication for surgery at this time - monitor for now  4. Age related macular degeneration, non-exudative, left eye  - The incidence, anatomy, and pathology of dry AMD, risk of progression, and the AREDS and AREDS 2 study including smoking risks discussed with patient.  - recommend Amsler grid monitoring  5. Choroidal nevus OS  - amelanotic nevus along distal ST arcades  - no SRF or orange pigment  - no elevation  - monitor  6,7. Hypertensive retinopathy OU - discussed importance of tight BP control - monitor  8. Pseudophakia OD  - s/p CE/IOL OD ([place])  - IOL in good position, doing well  - monitor  9. Mixed Cataract OS - The symptoms of cataract, surgical options, and treatments and risks were discussed with patient. - discussed diagnosis and progression  - approaching visual significance  - under the expert management of [place] Ordered this visit:  No orders of the defined types were placed in this encounter.      Return in about 4 weeks (around 06/02/2021) for f/u CME OD, DFE, OCT.  There are no Patient Instructions on file for this visit.  This document serves as a record of services personally performed by Gardiner Sleeper, MD, PhD. It was created on their behalf by Leeann Must, [place], an ophthalmic technician. The creation of this record is the provider's dictation and/or activities during the visit.    Electronically signed by: Leeann Must, COA $[email or web address]$ @ 1:08 PM   This document serves as a record of services personally performed by Gardiner Sleeper, MD, PhD. It was created on their behalf by San Jetty. Owens Shark, OA an ophthalmic technician. The creation of this record is the provider's dictation and/or activities during the visit.    Electronically signed by: San Jetty. [place], [place] 05.10.2022 1:08 PM  Gardiner Sleeper, M.D., Ph.D. Diseases & Surgery of the Retina and [place] 05/05/2021   I have reviewed the above documentation for accuracy and completeness, and I agree with the above. Gardiner Sleeper, M.D., Ph.D. 05/06/21 1:08 PM  Abbreviations: M myopia (nearsighted); A astigmatism; H hyperopia (farsighted); P presbyopia; Mrx spectacle prescription;  CTL contact lenses; OD right eye; OS left eye; OU both eyes  XT exotropia; ET esotropia; PEK punctate epithelial keratitis; PEE punctate epithelial erosions; DES dry eye syndrome; MGD meibomian gland dysfunction; ATs artificial tears; PFAT's preservative free artificial tears; [place] nuclear sclerotic cataract; PSC posterior subcapsular cataract; ERM epi-retinal membrane; PVD posterior vitreous detachment; RD retinal detachment; DM diabetes mellitus; DR diabetic retinopathy; NPDR non-proliferative diabetic retinopathy; PDR proliferative diabetic retinopathy; CSME clinically significant macular edema; DME diabetic  macular edema; dbh dot blot hemorrhages; CWS cotton wool spot; POAG primary open angle glaucoma; C/D cup-to-disc ratio; HVF humphrey visual field; GVF goldmann visual field; OCT optical coherence tomography; IOP intraocular pressure; BRVO Branch retinal vein occlusion; CRVO central retinal vein occlusion; CRAO central retinal artery occlusion; BRAO branch retinal artery occlusion; RT retinal tear; SB scleral buckle; PPV pars plana vitrectomy; VH Vitreous hemorrhage; PRP panretinal laser photocoagulation; IVK intravitreal kenalog; VMT vitreomacular traction; MH Macular hole;  NVD neovascularization of the disc; NVE neovascularization elsewhere; AREDS age related eye disease study; ARMD age related macular degeneration; POAG primary open angle glaucoma; EBMD epithelial/anterior basement membrane dystrophy; ACIOL anterior chamber intraocular lens; IOL intraocular lens; PCIOL posterior chamber intraocular lens; Phaco/IOL phacoemulsification with intraocular lens placement; PRK photorefractive keratectomy; LASIK laser assisted in situ keratomileusis; HTN  hypertension; DM diabetes mellitus; COPD chronic obstructive pulmonary disease

## 2021-05-05 ENCOUNTER — Encounter (INDEPENDENT_AMBULATORY_CARE_PROVIDER_SITE_OTHER): Payer: Self-pay | Admitting: Ophthalmology

## 2021-05-05 ENCOUNTER — Ambulatory Visit (INDEPENDENT_AMBULATORY_CARE_PROVIDER_SITE_OTHER): Payer: PPO | Admitting: Ophthalmology

## 2021-05-05 ENCOUNTER — Other Ambulatory Visit: Payer: Self-pay

## 2021-05-05 DIAGNOSIS — Z961 Presence of intraocular lens: Secondary | ICD-10-CM

## 2021-05-05 DIAGNOSIS — D3132 Benign neoplasm of left choroid: Secondary | ICD-10-CM

## 2021-05-05 DIAGNOSIS — H35371 Puckering of macula, right eye: Secondary | ICD-10-CM | POA: Diagnosis not present

## 2021-05-05 DIAGNOSIS — H3581 Retinal edema: Secondary | ICD-10-CM | POA: Diagnosis not present

## 2021-05-05 DIAGNOSIS — H35033 Hypertensive retinopathy, bilateral: Secondary | ICD-10-CM

## 2021-05-05 DIAGNOSIS — H25812 Combined forms of age-related cataract, left eye: Secondary | ICD-10-CM

## 2021-05-05 DIAGNOSIS — H35351 Cystoid macular degeneration, right eye: Secondary | ICD-10-CM

## 2021-05-05 DIAGNOSIS — H353122 Nonexudative age-related macular degeneration, left eye, intermediate dry stage: Secondary | ICD-10-CM | POA: Diagnosis not present

## 2021-05-05 DIAGNOSIS — I10 Essential (primary) hypertension: Secondary | ICD-10-CM | POA: Diagnosis not present

## 2021-05-06 ENCOUNTER — Encounter (INDEPENDENT_AMBULATORY_CARE_PROVIDER_SITE_OTHER): Payer: Self-pay | Admitting: Ophthalmology

## 2021-05-07 ENCOUNTER — Other Ambulatory Visit: Payer: Self-pay | Admitting: Cardiology

## 2021-05-12 DIAGNOSIS — J1282 Pneumonia due to coronavirus disease 2019: Secondary | ICD-10-CM | POA: Diagnosis not present

## 2021-05-12 DIAGNOSIS — G9341 Metabolic encephalopathy: Secondary | ICD-10-CM | POA: Diagnosis not present

## 2021-05-12 DIAGNOSIS — U071 COVID-19: Secondary | ICD-10-CM | POA: Diagnosis not present

## 2021-05-12 DIAGNOSIS — N179 Acute kidney failure, unspecified: Secondary | ICD-10-CM | POA: Diagnosis not present

## 2021-05-13 DIAGNOSIS — G9341 Metabolic encephalopathy: Secondary | ICD-10-CM | POA: Diagnosis not present

## 2021-05-13 DIAGNOSIS — U071 COVID-19: Secondary | ICD-10-CM | POA: Diagnosis not present

## 2021-05-13 DIAGNOSIS — J1282 Pneumonia due to coronavirus disease 2019: Secondary | ICD-10-CM | POA: Diagnosis not present

## 2021-05-13 DIAGNOSIS — N179 Acute kidney failure, unspecified: Secondary | ICD-10-CM | POA: Diagnosis not present

## 2021-06-01 ENCOUNTER — Other Ambulatory Visit (HOSPITAL_COMMUNITY): Payer: Self-pay | Admitting: Internal Medicine

## 2021-06-01 ENCOUNTER — Other Ambulatory Visit: Payer: Self-pay

## 2021-06-01 ENCOUNTER — Ambulatory Visit (HOSPITAL_COMMUNITY)
Admission: RE | Admit: 2021-06-01 | Discharge: 2021-06-01 | Disposition: A | Discharge status: Home / Self Care | Payer: Medicare HMO | Source: Ambulatory Visit | Attending: Internal Medicine | Admitting: Internal Medicine

## 2021-06-01 DIAGNOSIS — J449 Chronic obstructive pulmonary disease, unspecified: Secondary | ICD-10-CM | POA: Diagnosis not present

## 2021-06-01 DIAGNOSIS — G894 Chronic pain syndrome: Secondary | ICD-10-CM | POA: Diagnosis not present

## 2021-06-01 DIAGNOSIS — E063 Autoimmune thyroiditis: Secondary | ICD-10-CM | POA: Diagnosis not present

## 2021-06-01 DIAGNOSIS — Z6830 Body mass index (BMI) 30.0-30.9, adult: Secondary | ICD-10-CM | POA: Diagnosis not present

## 2021-06-01 DIAGNOSIS — D649 Anemia, unspecified: Secondary | ICD-10-CM | POA: Diagnosis not present

## 2021-06-01 DIAGNOSIS — R059 Cough, unspecified: Secondary | ICD-10-CM

## 2021-06-01 DIAGNOSIS — Z79891 Long term (current) use of opiate analgesic: Secondary | ICD-10-CM | POA: Diagnosis not present

## 2021-06-01 DIAGNOSIS — J189 Pneumonia, unspecified organism: Secondary | ICD-10-CM | POA: Diagnosis not present

## 2021-06-01 DIAGNOSIS — E6609 Other obesity due to excess calories: Secondary | ICD-10-CM | POA: Diagnosis not present

## 2021-06-01 DIAGNOSIS — R0602 Shortness of breath: Secondary | ICD-10-CM | POA: Diagnosis not present

## 2021-06-02 ENCOUNTER — Encounter (INDEPENDENT_AMBULATORY_CARE_PROVIDER_SITE_OTHER): Payer: PPO | Admitting: Ophthalmology

## 2021-06-09 ENCOUNTER — Ambulatory Visit: Payer: Medicare HMO | Admitting: Pulmonary Disease

## 2021-06-09 ENCOUNTER — Other Ambulatory Visit: Payer: Self-pay

## 2021-06-09 ENCOUNTER — Encounter: Payer: Self-pay | Admitting: Pulmonary Disease

## 2021-06-09 VITALS — BP 132/80 | HR 56 | Temp 97.7°F | Ht 68.0 in | Wt 218.4 lb

## 2021-06-09 DIAGNOSIS — J449 Chronic obstructive pulmonary disease, unspecified: Secondary | ICD-10-CM

## 2021-06-09 DIAGNOSIS — J441 Chronic obstructive pulmonary disease with (acute) exacerbation: Secondary | ICD-10-CM

## 2021-06-09 MED ORDER — PREDNISONE 10 MG PO TABS: ORAL_TABLET | ORAL | 0 refills | Status: AC

## 2021-06-09 MED ORDER — ALBUTEROL SULFATE HFA 108 (90 BASE) MCG/ACT IN AERS: 2.0000 | INHALATION_SPRAY | Freq: Four times a day (QID) | RESPIRATORY_TRACT | 3 refills | Status: DC | PRN

## 2021-06-09 MED ORDER — BREZTRI AEROSPHERE 160-9-4.8 MCG/ACT IN AERO: 2.0000 | INHALATION_SPRAY | Freq: Two times a day (BID) | RESPIRATORY_TRACT | 0 refills | Status: DC

## 2021-06-09 MED ORDER — BREZTRI AEROSPHERE 160-9-4.8 MCG/ACT IN AERO: 2.0000 | INHALATION_SPRAY | Freq: Two times a day (BID) | RESPIRATORY_TRACT | 5 refills | Status: DC

## 2021-06-09 MED ORDER — ALBUTEROL SULFATE (2.5 MG/3ML) 0.083% IN NEBU: 2.5000 mg | INHALATION_SOLUTION | Freq: Four times a day (QID) | RESPIRATORY_TRACT | 3 refills | Status: DC | PRN

## 2021-06-09 NOTE — Progress Notes (Signed)
[facility]  Chief Complaint  Patient presents with   Consult    Productive cough with off white colored phlegm    Constitutional:  BP 132/80 (BP Location: Left Arm, Cuff Size: Normal)   Pulse (!) 56   Temp 97.7 F (36.5 C) (Temporal)   Ht 5\' 8"  (1.727 m)   Wt 218 lb 6.4 oz (99.1 kg)   SpO2 95% Comment: Room air  BMI 33.21 kg/m   Past Medical History:  Alzheimer's disease, Anxiety, Gastritis, CAD, DDD, BPH, HTN, Hypothyroidism, LBBB, HLD, COVID 19 PNA February 2021, PNA September 2015 and MAy 2022  Past Surgical History:  He  has a past surgical history that includes Coronary artery bypass graft (1996); Hernia repair; Coronary angioplasty with stent; Colonoscopy (08/03/2004); Colonoscopy (07/12/2012); Esophagogastroduodenoscopy (N/A, 03/16/2013); Esophagogastroduodenoscopy (N/A, 03/06/2013); Colonoscopy (N/A, 06/22/2016); LEFT HEART CATH AND CORS/GRAFTS ANGIOGRAPHY (N/A, 12/10/2019); CORONARY STENT INTERVENTION (N/A, 12/10/2019); Intravascular Ultrasound/IVUS (N/A, 12/10/2019); and LEFT HEART CATH AND CORS/GRAFTS ANGIOGRAPHY (N/A, 03/02/2021).  Brief Summary:  Dustin [name] is a 87 y.o. male former smoker with dyspnea.      Subjective:   He has extensive prior history of smoking.  He has been told he has COPD.  He has inhalers and nebulizer at home, but difficult to determine what he has been using.  Treated for pneumonia again in May.  CXR from this month was clear.  Still on antibiotics.  Last on prednisone 2 months ago.  He reports having home oxygen set up - he doesn't know where he got this from or who ordered it.  He doesn't use oxygen.  His SpO2 stays around 88 to 90% as long as he sits.  He doesn't do any activity.  He is coughing and has clear sputum.  Hears himself wheezing.  No fever, or hemoptysis.  Chest feels sore from coughing.  He maintained SpO2 > 96% on room air while walking today.  Physical Exam:   Appearance - well kempt    ENMT - no sinus tenderness, no oral exudate, no LAN, Mallampati 3 airway, no stridor  Respiratory - diffuse b/l wheezing  CV - s1s2 regular rate and rhythm, no murmurs  Ext - no clubbing, no edema  Skin - no rashes  Psych - normal mood and affect   Pulmonary testing:    Chest Imaging:  CT angio chest 09/21/20 >> atherosclerosis  Cardiac Tests:  Echo 12/10/19 >> EF 55 to 60%, grade 1 DD  Social History:  He  reports that he quit smoking about 26 years ago. His smoking use included cigarettes. He has a 80.00 pack-year smoking history. He has never used smokeless tobacco. He reports that he does not drink alcohol and does not use drugs.  Family History:  His family history includes Heart attack in his mother; Heart disease in an other family member.     Assessment/Plan:   COPD. - he had recent pneumonia and continues to have exacerbation - complete antibiotics from PCP - will give him course of prednisone - will have him switch to breztri; samples given - prn albuterol - would likely benefit from pulmonary rehab; will need to get PFT when he is more stable and then discuss referral to pulmonary rehab  Time Spent Involved in Patient Care on Day of Examination:  49 minutes  Follow up:   Patient Instructions  Prednisone 10 mg pills >> 3 pills daily for 2 days, 2 pills daily for 2 days, 1 pill  daily for 2 days  Brezti two puffs in the morning and two puffs in the evening, and rinse your mouth after each use  Albuterol every 4 hours as needed for cough, wheeze, or chest congestion  Follow up in 7 to 8 weeks  Medication List:   Allergies as of 06/09/2021   No Known Allergies      Medication List        Accurate as of June 09, 2021 11:06 AM. If you have any questions, ask your nurse or doctor.          STOP taking these medications    budesonide-formoterol 160-4.5 MCG/ACT inhaler Commonly known as: Symbicort Stopped by: Chesley Mires, MD       TAKE  these medications    albuterol (2.5 MG/3ML) 0.083% nebulizer solution Commonly known as: PROVENTIL Take 3 mLs (2.5 mg total) by nebulization every 6 (six) hours as needed for wheezing or shortness of breath. Patient will also need the nebulizer machine with this prescription.   albuterol 108 (90 Base) MCG/ACT inhaler Commonly known as: VENTOLIN HFA Inhale 2 puffs into the lungs every 6 (six) hours as needed for wheezing or shortness of breath.   alfuzosin 10 MG 24 hr tablet Commonly known as: UROXATRAL Take 1 tablet (10 mg total) by mouth daily with breakfast.   aspirin EC 81 MG tablet Take 81 mg by mouth daily.   Breztri Aerosphere 160-9-4.8 MCG/ACT Aero Generic drug: Budeson-Glycopyrrol-Formoterol Inhale 2 puffs into the lungs in the morning and at bedtime. Started by: Chesley Mires, MD   Arnell Sieving [phone] MCG/ACT Aero Generic drug: Budeson-Glycopyrrol-Formoterol Inhale 2 puffs into the lungs in the morning and at bedtime. Started by: Chesley Mires, MD   busPIRone 10 MG tablet Commonly known as: BUSPAR Take 10 mg by mouth 2 (two) times daily.   clopidogrel 75 MG tablet Commonly known as: PLAVIX TAKE 1 TABLET BY MOUTH DAILY.   doxazosin 1 MG tablet Commonly known as: CARDURA Take 1 mg by mouth at bedtime.   dutasteride 0.5 MG capsule Commonly known as: AVODART Take 1 capsule (0.5 mg total) by mouth daily. In the afternoon   escitalopram 20 MG tablet Commonly known as: LEXAPRO Take 20 mg by mouth every morning.   furosemide 40 MG tablet Commonly known as: LASIX Take 1 tablet (40 mg total) by mouth in the morning. What changed: how much to take   guaiFENesin 600 MG 12 hr tablet Commonly known as: MUCINEX Take 1 tablet (600 mg total) by mouth 2 (two) times daily.   hydrOXYzine 25 MG tablet Commonly known as: ATARAX/VISTARIL Take 25 mg by mouth 2 (two) times daily as needed for anxiety.   isosorbide mononitrate 60 MG 24 hr tablet Commonly known as:  IMDUR Take 120 mg by mouth every morning.   isosorbide mononitrate 30 MG 24 hr tablet Commonly known as: IMDUR TAKE (1) TABLET BY MOUTH EACH MORNING.   levothyroxine 50 MCG tablet Commonly known as: SYNTHROID Take 50 mcg by mouth every morning.   meclizine 25 MG tablet Commonly known as: ANTIVERT Take 25 mg by mouth 4 (four) times daily as needed for dizziness.   memantine 10 MG tablet Commonly known as: NAMENDA Take 10 mg by mouth 2 (two) times daily.   Movantik 25 MG Tabs tablet Generic drug: naloxegol oxalate Take 25 mg by mouth daily.   nitroGLYCERIN 0.4 MG SL tablet Commonly known as: NITROSTAT Place 1 tablet (0.4 mg total) under the tongue every 5 (five) minutes  as needed for chest pain. Pt has bought nitro bottle from home and oxycodone 30 mg ( 3 or 4 tabs) is in nitro bottle per wife Tyan Dy.   oxycodone 30 MG immediate release tablet Commonly known as: ROXICODONE Take 30 mg by mouth every 4 (four) hours as needed for pain. Take 1 tablet by mouth every 4 to 6 hours as needed for pain.  Max 8 /24 hours.   pantoprazole 40 MG tablet Commonly known as: PROTONIX Take 1 tablet (40 mg total) by mouth every morning.   potassium chloride SA 20 MEQ tablet Commonly known as: KLOR-CON Take 20 mEq by mouth daily.   predniSONE 10 MG tablet Commonly known as: DELTASONE Take 3 tablets (30 mg total) by mouth daily with breakfast for 2 days, THEN 2 tablets (20 mg total) daily with breakfast for 2 days, THEN 1 tablet (10 mg total) daily with breakfast for 2 days. Start taking on: June 09, 2021 Started by: Chesley Mires, MD   QUEtiapine 25 MG tablet Commonly known as: SEROQUEL Take 25 mg by mouth at bedtime.   ranolazine 500 MG 12 hr tablet Commonly known as: Ranexa Take 1 tablet (500 mg total) by mouth 2 (two) times daily.   rosuvastatin 40 MG tablet Commonly known as: CRESTOR Take 1 tablet (40 mg total) by mouth daily at 6 PM.   sulfamethoxazole-trimethoprim  800-160 MG tablet Commonly known as: BACTRIM DS Take 1 tablet by mouth 2 (two) times daily.   tamsulosin 0.4 MG Caps capsule Commonly known as: FLOMAX Take 1 capsule (0.4 mg total) by mouth daily. To help you pee.   Vitamin D (Ergocalciferol) 1.25 MG (50000 UNIT) Caps capsule Commonly known as: DRISDOL Take 50,000 Units by mouth once a week.   zinc sulfate 220 (50 Zn) MG capsule Take 1 capsule (220 mg total) by mouth daily.        Signature:  Chesley Mires, MD Stella Pager - [phone] 06/09/2021, 11:06 AM

## 2021-06-09 NOTE — Patient Instructions (Signed)
Prednisone 10 mg pills >> 3 pills daily for 2 days, 2 pills daily for 2 days, 1 pill daily for 2 days  Brezti two puffs in the morning and two puffs in the evening, and rinse your mouth after each use  Albuterol every 4 hours as needed for cough, wheeze, or chest congestion  Follow up in 7 to 8 weeks

## 2021-06-11 ENCOUNTER — Telehealth: Payer: Self-pay | Admitting: Pulmonary Disease

## 2021-06-11 DIAGNOSIS — J449 Chronic obstructive pulmonary disease, unspecified: Secondary | ICD-10-CM

## 2021-06-11 NOTE — Telephone Encounter (Addendum)
Called and spoke with Erlene Quan from [place] needs order for [employer].  Called and spoke with patient who confirmed that he needs a new nebulizer machine due to his current one is not working. Patient aware order has been placed for a new neb machine.  Order placed per verbal from Dr Halford Chessman. Will route to Dr Halford Chessman to sign off. Nothing further needed at this time.

## 2021-06-12 DIAGNOSIS — G9341 Metabolic encephalopathy: Secondary | ICD-10-CM | POA: Diagnosis not present

## 2021-06-12 DIAGNOSIS — N179 Acute kidney failure, unspecified: Secondary | ICD-10-CM | POA: Diagnosis not present

## 2021-06-12 DIAGNOSIS — J1282 Pneumonia due to coronavirus disease 2019: Secondary | ICD-10-CM | POA: Diagnosis not present

## 2021-06-12 DIAGNOSIS — U071 COVID-19: Secondary | ICD-10-CM | POA: Diagnosis not present

## 2021-06-12 NOTE — Telephone Encounter (Signed)
Okay to send order for new home nebulizer

## 2021-06-12 NOTE — Telephone Encounter (Signed)
Patient was called on 6/16 and order was placed for nebulizer machine.  Nothing further needed.Closing encounter.

## 2021-06-13 DIAGNOSIS — N179 Acute kidney failure, unspecified: Secondary | ICD-10-CM | POA: Diagnosis not present

## 2021-06-13 DIAGNOSIS — J1282 Pneumonia due to coronavirus disease 2019: Secondary | ICD-10-CM | POA: Diagnosis not present

## 2021-06-13 DIAGNOSIS — G9341 Metabolic encephalopathy: Secondary | ICD-10-CM | POA: Diagnosis not present

## 2021-06-13 DIAGNOSIS — U071 COVID-19: Secondary | ICD-10-CM | POA: Diagnosis not present

## 2021-06-17 DIAGNOSIS — L821 Other seborrheic keratosis: Secondary | ICD-10-CM | POA: Diagnosis not present

## 2021-06-17 DIAGNOSIS — I872 Venous insufficiency (chronic) (peripheral): Secondary | ICD-10-CM | POA: Diagnosis not present

## 2021-06-30 ENCOUNTER — Ambulatory Visit: Payer: Medicare HMO | Admitting: General Surgery

## 2021-06-30 DIAGNOSIS — K5909 Other constipation: Secondary | ICD-10-CM | POA: Diagnosis not present

## 2021-06-30 DIAGNOSIS — J449 Chronic obstructive pulmonary disease, unspecified: Secondary | ICD-10-CM | POA: Diagnosis not present

## 2021-06-30 DIAGNOSIS — G894 Chronic pain syndrome: Secondary | ICD-10-CM | POA: Diagnosis not present

## 2021-06-30 DIAGNOSIS — Z6830 Body mass index (BMI) 30.0-30.9, adult: Secondary | ICD-10-CM | POA: Diagnosis not present

## 2021-06-30 DIAGNOSIS — E6609 Other obesity due to excess calories: Secondary | ICD-10-CM | POA: Diagnosis not present

## 2021-06-30 DIAGNOSIS — E063 Autoimmune thyroiditis: Secondary | ICD-10-CM | POA: Diagnosis not present

## 2021-07-07 ENCOUNTER — Encounter: Payer: Self-pay | Admitting: General Surgery

## 2021-07-07 ENCOUNTER — Ambulatory Visit: Payer: Medicare HMO | Admitting: General Surgery

## 2021-07-07 ENCOUNTER — Other Ambulatory Visit: Payer: Self-pay

## 2021-07-07 VITALS — BP 109/72 | HR 62 | Temp 98.2°F | Resp 18 | Ht 68.0 in | Wt 214.0 lb

## 2021-07-07 DIAGNOSIS — K429 Umbilical hernia without obstruction or gangrene: Secondary | ICD-10-CM

## 2021-07-08 NOTE — Progress Notes (Signed)
Dustin [name]; [id number]; [birth year]   HPI Patient is a 87 year old white male who was referred to my care by Dr. Riley [name] for a possible ventral hernia.  Patient states he has noted some swelling of the upper abdomen along the midline when he sits up.  He states it does not bother him.  He wanted me to check out his abdomen as he thought he might have a hernia.  He denies any nausea or vomiting.  He has multiple comorbidities including Alzheimer's disease.  He was told to see me as he has a relative who I performed hernia surgery on. Past Medical History:  Diagnosis Date   Alzheimer disease ([place])    Anxiety    Arthritis    Atrophic gastritis    a. By EGD 02/2013.   Carotid artery disease ([facility])    a. mild-mod plaque, <50% stenosis bilat by duplex 2018.   Coronary atherosclerosis of native coronary artery    a. Multivessel s/p CABG 1996. b. s/p DES x 2 SVG to PDA 8/12 with distal disease managed medically.   DDD (degenerative disc disease)    Chronic back pain   Enlarged prostate    Essential hypertension    Hematuria    Hypothyroidism    LBBB (left bundle branch block)    MI (myocardial infarction) (HCC)    Mixed hyperlipidemia    OA (osteoarthritis)    OSA (obstructive sleep apnea)    Pneumonia due to COVID-19 virus    February 2021   Sinus bradycardia    a. Aricept and BB discontinued due to this.    Past Surgical History:  Procedure Laterality Date   COLONOSCOPY  08/03/2004   D[facility]in [name]-numerous large diverticula in the descending, transverse, descending, and sigmoid colon. Otherwise normal exam.   COLONOSCOPY  07/12/2012   RMR: External hemorrhoidal tag; multiple rectal and colonic polyps removed and/or treated as described above. Pancolonic diverticulosis. Bx-tubular adenomas, rectal hyperplastic polyp. next colonoscopy in 06/2015.   COLONOSCOPY N/A 06/22/2016   Procedure: COLONOSCOPY;  Surgeon: Dustin Signs, MD;  [facility]: [facility];  Service: Gastroenterology;  Laterality:  N/A;  73   CORONARY ANGIOPLASTY WITH STENT PLACEMENT     CORONARY ARTERY BYPASS GRAFT  1996   LIMA to LAD, SVG to D2, SVG to PDA, SVG to OM1 and OM2   CORONARY STENT INTERVENTION N/A 12/10/2019   Procedure: CORONARY STENT INTERVENTION;  Surgeon: Dustin Blanks, MD;  Location: [name];  Service: Cardiovascular;  Laterality: N/A;   ESOPHAGOGASTRODUODENOSCOPY N/A 03/16/2013   Procedure: ESOPHAGOGASTRODUODENOSCOPY (EGD);  Surgeon: Dustin Dolin, MD;  [facility];  Service: Endoscopy;  Laterality: N/A;  12:00-moved to [name] notified pt   ESOPHAGOGASTRODUODENOSCOPY N/A 03/06/2013   Procedure: ESOPHAGOGASTRODUODENOSCOPY (EGD);  Surgeon: Dustin Dolin, MD;  [facility];  Service: Endoscopy;  Laterality: N/A;   HERNIA REPAIR     INTRAVASCULAR ULTRASOUND/IVUS N/A 12/10/2019   Procedure: Intravascular Ultrasound/IVUS;  Surgeon: Dustin Blanks, MD;  Location: [name];  Service: Cardiovascular;  Laterality: N/A;   LEFT HEART CATH AND CORS/GRAFTS ANGIOGRAPHY N/A 12/10/2019   Procedure: LEFT HEART CATH AND CORS/GRAFTS ANGIOGRAPHY;  Surgeon: Dustin Blanks, MD;  Location: [name];  Service: Cardiovascular;  Laterality: N/A;   LEFT HEART CATH AND CORS/GRAFTS ANGIOGRAPHY N/A 03/02/2021   Procedure: LEFT HEART CATH AND CORS/GRAFTS ANGIOGRAPHY;  Surgeon: Dustin Bush, MD;  Location: [name];  Service: Cardiovascular;  Laterality: N/A;  Family History  Problem Relation Age of Onset   Heart disease Other    Heart attack Mother    Colon cancer Neg Hx     Current Outpatient Medications on File Prior to Visit  Medication Sig Dispense Refill   albuterol (PROVENTIL) (2.5 MG/3ML) 0.083% nebulizer solution Take 3 mLs (2.5 mg total) by nebulization every 6 (six) hours as needed for wheezing or shortness of breath. Patient will also need the nebulizer machine with this prescription. 75 mL 3   albuterol  (VENTOLIN HFA) 108 (90 Base) MCG/ACT inhaler Inhale 2 puffs into the lungs every 6 (six) hours as needed for wheezing or shortness of breath. 18 g 3   alfuzosin (UROXATRAL) 10 MG 24 hr tablet Take 1 tablet (10 mg total) by mouth daily with breakfast. 30 tablet 11   Budeson-Glycopyrrol-Formoterol (BREZTRI AEROSPHERE) 160-9-4.8 MCG/ACT AERO Inhale 2 puffs into the lungs in the morning and at bedtime. 10.7 g 5   Budeson-Glycopyrrol-Formoterol (BREZTRI AEROSPHERE) 160-9-4.8 MCG/ACT AERO Inhale 2 puffs into the lungs in the morning and at bedtime. 10.7 g 0   busPIRone (BUSPAR) 10 MG tablet Take 10 mg by mouth 2 (two) times daily.     clopidogrel (PLAVIX) 75 MG tablet TAKE 1 TABLET BY MOUTH DAILY. 90 tablet 3   doxazosin (CARDURA) 1 MG tablet Take 1 mg by mouth at bedtime.     dutasteride (AVODART) 0.5 MG capsule Take 1 capsule (0.5 mg total) by mouth daily. In the afternoon 90 capsule 3   escitalopram (LEXAPRO) 20 MG tablet Take 20 mg by mouth every morning.      furosemide (LASIX) 40 MG tablet Take 1 tablet (40 mg total) by mouth in the morning. (Patient taking differently: Take 20 mg by mouth in the morning.) 30 tablet 5   guaiFENesin (MUCINEX) 600 MG 12 hr tablet Take 1 tablet (600 mg total) by mouth 2 (two) times daily. 20 tablet 0   hydrOXYzine (ATARAX/VISTARIL) 25 MG tablet Take 25 mg by mouth 2 (two) times daily as needed for anxiety.     isosorbide mononitrate (IMDUR) 30 MG 24 hr tablet TAKE (1) TABLET BY MOUTH EACH MORNING. 90 tablet 2   isosorbide mononitrate (IMDUR) 60 MG 24 hr tablet Take 120 mg by mouth every morning.     levothyroxine (SYNTHROID, LEVOTHROID) 50 MCG tablet Take 50 mcg by mouth every morning.     meclizine (ANTIVERT) 25 MG tablet Take 25 mg by mouth 4 (four) times daily as needed for dizziness.     memantine (NAMENDA) 10 MG tablet Take 10 mg by mouth 2 (two) times daily.      MOVANTIK 25 MG TABS tablet Take 25 mg by mouth daily.     nitroGLYCERIN (NITROSTAT) 0.4 MG SL tablet  Place 1 tablet (0.4 mg total) under the tongue every 5 (five) minutes as needed for chest pain. Pt has bought nitro bottle from home and oxycodone 30 mg ( 3 or 4 tabs) is in nitro bottle per wife Dustin [name]. 25 tablet 3   oxycodone (ROXICODONE) 30 MG immediate release tablet Take 30 mg by mouth every 4 (four) hours as needed for pain. Take 1 tablet by mouth every 4 to 6 hours as needed for pain.  Max 8 /24 hours.     pantoprazole (PROTONIX) 40 MG tablet Take 1 tablet (40 mg total) by mouth every morning. 30 tablet 1   potassium chloride SA (K-DUR,KLOR-CON) 20 MEQ tablet Take 20 mEq by mouth daily.  QUEtiapine (SEROQUEL) 25 MG tablet Take 25 mg by mouth at bedtime.     ranolazine (RANEXA) 500 MG 12 hr tablet Take 1 tablet (500 mg total) by mouth 2 (two) times daily. 60 tablet 11   rosuvastatin (CRESTOR) 40 MG tablet Take 1 tablet (40 mg total) by mouth daily at 6 PM. 30 tablet 0   sulfamethoxazole-trimethoprim (BACTRIM DS) 800-160 MG tablet Take 1 tablet by mouth 2 (two) times daily.     tamsulosin (FLOMAX) 0.4 MG CAPS capsule Take 1 capsule (0.4 mg total) by mouth daily. To help you pee. 30 capsule 3   Vitamin D, Ergocalciferol, (DRISDOL) 1.25 MG (50000 UNIT) CAPS capsule Take 50,000 Units by mouth once a week.     zinc sulfate 220 (50 Zn) MG capsule Take 1 capsule (220 mg total) by mouth daily. 30 capsule 1   aspirin EC 81 MG tablet Take 81 mg by mouth daily. (Patient not taking: No sig reported)     No current facility-administered medications on file prior to visit.    No Known Allergies  Social History   Substance and Sexual Activity  Alcohol Use No   Alcohol/week: 0.0 standard drinks    Social History   Tobacco Use  Smoking Status Former   Packs/day: 2.00   Years: 40.00   Pack years: 80.00   Types: Cigarettes   Quit date: 12/27/1994   Years since quitting: 26.5  Smokeless Tobacco Never    Review of Systems  Constitutional:  Positive for malaise/fatigue.  HENT:  Negative.    Eyes: Negative.   Respiratory:  Positive for shortness of breath and wheezing.   Cardiovascular: Negative.   Gastrointestinal:  Positive for heartburn.  Genitourinary: Negative.   Musculoskeletal:  Positive for back pain and joint pain.  Skin: Negative.   Neurological: Negative.   Endo/Heme/Allergies:  Bruises/bleeds easily.  Psychiatric/Behavioral: Negative.     Objective   Vitals:   07/07/21 1452  BP: 109/72  Pulse: 62  Resp: 18  Temp: 98.2 F (36.8 C)  SpO2: (!) 87%    Physical Exam Vitals reviewed.  Constitutional:      Appearance: Normal appearance. He is not ill-appearing.  HENT:     Head: Normocephalic and atraumatic.  Cardiovascular:     Rate and Rhythm: Normal rate and regular rhythm.     Heart sounds: Normal heart sounds. No murmur heard.   No friction rub. No gallop.  Pulmonary:     Effort: Pulmonary effort is normal. No respiratory distress.     Breath sounds: No stridor. Wheezing present. No rhonchi or rales.  Abdominal:     General: Bowel sounds are normal. There is no distension.     Palpations: Abdomen is soft. There is no mass.     Tenderness: There is no abdominal tenderness. There is no guarding or rebound.     Hernia: A hernia is present.     Comments: Large diastases recti present.  Patient also has a small reducible umbilical hernia.  Patient was not aware of the umbilical hernia.  He states his umbilicus does not bother him during palpation.  Skin:    General: Skin is warm and dry.  Neurological:     Mental Status: He is alert and oriented to person, place, and time.    Assessment  Diastases recti Umbilical hernia, asymptomatic Multiple comorbidities Plan  I explained to the patient about his diastases recti.  This is a nonsurgical issue.  He does have an umbilical hernia which  is currently asymptomatic.  He is at high risk for any surgical intervention and his hernia does not warrant repair at this time.  Patient understands  and agrees.  Literature was given.  Follow-up as needed.

## 2021-07-12 DIAGNOSIS — J1282 Pneumonia due to coronavirus disease 2019: Secondary | ICD-10-CM | POA: Diagnosis not present

## 2021-07-12 DIAGNOSIS — U071 COVID-19: Secondary | ICD-10-CM | POA: Diagnosis not present

## 2021-07-12 DIAGNOSIS — G9341 Metabolic encephalopathy: Secondary | ICD-10-CM | POA: Diagnosis not present

## 2021-07-12 DIAGNOSIS — N179 Acute kidney failure, unspecified: Secondary | ICD-10-CM | POA: Diagnosis not present

## 2021-07-13 DIAGNOSIS — J1282 Pneumonia due to coronavirus disease 2019: Secondary | ICD-10-CM | POA: Diagnosis not present

## 2021-07-13 DIAGNOSIS — U071 COVID-19: Secondary | ICD-10-CM | POA: Diagnosis not present

## 2021-07-13 DIAGNOSIS — N179 Acute kidney failure, unspecified: Secondary | ICD-10-CM | POA: Diagnosis not present

## 2021-07-13 DIAGNOSIS — G9341 Metabolic encephalopathy: Secondary | ICD-10-CM | POA: Diagnosis not present

## 2021-07-14 ENCOUNTER — Encounter: Payer: Self-pay | Admitting: Cardiology

## 2021-07-14 ENCOUNTER — Other Ambulatory Visit: Payer: Self-pay

## 2021-07-14 ENCOUNTER — Ambulatory Visit: Payer: PPO | Admitting: Cardiology

## 2021-07-14 VITALS — BP 128/56 | HR 63 | Ht 69.0 in | Wt 218.4 lb

## 2021-07-14 DIAGNOSIS — I255 Ischemic cardiomyopathy: Secondary | ICD-10-CM | POA: Diagnosis not present

## 2021-07-14 DIAGNOSIS — I10 Essential (primary) hypertension: Secondary | ICD-10-CM

## 2021-07-14 DIAGNOSIS — I25119 Atherosclerotic heart disease of native coronary artery with unspecified angina pectoris: Secondary | ICD-10-CM | POA: Diagnosis not present

## 2021-07-14 DIAGNOSIS — E782 Mixed hyperlipidemia: Secondary | ICD-10-CM

## 2021-07-14 NOTE — Progress Notes (Signed)
Cardiology Office Note  Date: 07/14/2021   ID: Dustin [name], DOB 01/04/1934, MRN [id number]  PCP:  [name], MD  Cardiologist:  Rozann Lesches, MD Electrophysiologist:  None   Chief Complaint  Patient presents with   Cardiac follow-up.    History of Present Illness: Dustin [name] is an 87 y.o. male last seen in March by Ms. Bonnell Public PA-C.  He is here today for a routine visit with his wife.  He remains chronically short of breath, no escalation in symptoms or worsening angina symptoms on current medical therapy.  Cardiac catheterization from March is noted below with plan for medical therapy to treat both native vessel and graft disease, no clear targets for PCI at that point.  I reviewed his current medications, no changes were made today.  He does not report any spontaneous bleeding problems on aspirin and Plavix.  Past Medical History:  Diagnosis Date   Alzheimer disease ([place])    Anxiety    Arthritis    Atrophic gastritis    a. By EGD 02/2013.   Carotid artery disease (HCC)    a. mild-mod plaque, <50% stenosis bilat by duplex 2018.   Coronary atherosclerosis of native coronary artery    a. Multivessel s/p CABG 1996. b. s/p DES x 2 SVG to PDA 8/12 with distal disease managed medically.   DDD (degenerative disc disease)    Chronic back pain   Enlarged prostate    Essential hypertension    Hematuria    Hypothyroidism    LBBB (left bundle branch block)    MI (myocardial infarction) (HCC)    Mixed hyperlipidemia    OA (osteoarthritis)    OSA (obstructive sleep apnea)    Pneumonia due to COVID-19 virus    February 2021   Sinus bradycardia    a. Aricept and BB discontinued due to this.    Past Surgical History:  Procedure Laterality Date   COLONOSCOPY  08/03/2004   [place]kins-numerous large diverticula in the descending, transverse, descending, and sigmoid colon. Otherwise normal exam.   COLONOSCOPY  07/12/2012   RMR: External hemorrhoidal tag; multiple rectal  and colonic polyps removed and/or treated as described above. Pancolonic diverticulosis. Bx-tubular adenomas, rectal hyperplastic polyp. next colonoscopy in 06/2015.   COLONOSCOPY N/A 06/22/2016   Procedure: COLONOSCOPY;  Surgeon: Aviva Signs, MD;  [facility]: [facility];  Service: Gastroenterology;  Laterality: N/A;  64   CORONARY ANGIOPLASTY WITH STENT PLACEMENT     CORONARY ARTERY BYPASS GRAFT  1996   LIMA to LAD, SVG to D2, SVG to PDA, SVG to OM1 and OM2   CORONARY STENT INTERVENTION N/A 12/10/2019   Procedure: CORONARY STENT INTERVENTION;  Surgeon: Burnell Blanks, MD;  Location: [facility];  Service: Cardiovascular;  Laterality: N/A;   ESOPHAGOGASTRODUODENOSCOPY N/A 03/16/2013   Procedure: ESOPHAGOGASTRODUODENOSCOPY (EGD);  Surgeon: Daneil Dolin, MD;  Location: [facility];  Service: Endoscopy;  Laterality: N/A;  12:00-moved to [place] notified pt   ESOPHAGOGASTRODUODENOSCOPY N/A 03/06/2013   Procedure: ESOPHAGOGASTRODUODENOSCOPY (EGD);  Surgeon: Daneil Dolin, MD;  Location: [facility];  Service: Endoscopy;  Laterality: N/A;   HERNIA REPAIR     INTRAVASCULAR ULTRASOUND/IVUS N/A 12/10/2019   Procedure: Intravascular Ultrasound/IVUS;  Surgeon: Burnell Blanks, MD;  Location: [facility];  Service: Cardiovascular;  Laterality: N/A;   LEFT HEART CATH AND CORS/GRAFTS ANGIOGRAPHY N/A 12/10/2019   Procedure: LEFT HEART CATH AND CORS/GRAFTS ANGIOGRAPHY;  Surgeon: Burnell Blanks, MD;  Location: [facility];  Service: Cardiovascular;  Laterality: N/A;   LEFT HEART CATH AND CORS/GRAFTS ANGIOGRAPHY N/A 03/02/2021   Procedure: LEFT HEART CATH AND CORS/GRAFTS ANGIOGRAPHY;  Surgeon: Nelva Bush, MD;  Location: [facility];  Service: Cardiovascular;  Laterality: N/A;    Current Outpatient Medications  Medication Sig Dispense Refill   albuterol (PROVENTIL) (2.5 MG/3ML) 0.083% nebulizer solution Take 3 mLs (2.5 mg total) by  nebulization every 6 (six) hours as needed for wheezing or shortness of breath. Patient will also need the nebulizer machine with this prescription. 75 mL 3   albuterol (VENTOLIN HFA) 108 (90 Base) MCG/ACT inhaler Inhale 2 puffs into the lungs every 6 (six) hours as needed for wheezing or shortness of breath. 18 g 3   alfuzosin (UROXATRAL) 10 MG 24 hr tablet Take 1 tablet (10 mg total) by mouth daily with breakfast. 30 tablet 11   aspirin EC 81 MG tablet Take 81 mg by mouth daily.     Budeson-Glycopyrrol-Formoterol (BREZTRI AEROSPHERE) 160-9-4.8 MCG/ACT AERO Inhale 2 puffs into the lungs in the morning and at bedtime. 10.7 g 5   Budeson-Glycopyrrol-Formoterol (BREZTRI AEROSPHERE) 160-9-4.8 MCG/ACT AERO Inhale 2 puffs into the lungs in the morning and at bedtime. 10.7 g 0   busPIRone (BUSPAR) 10 MG tablet Take 10 mg by mouth 2 (two) times daily.     clopidogrel (PLAVIX) 75 MG tablet TAKE 1 TABLET BY MOUTH DAILY. 90 tablet 3   doxazosin (CARDURA) 1 MG tablet Take 1 mg by mouth at bedtime.     dutasteride (AVODART) 0.5 MG capsule Take 1 capsule (0.5 mg total) by mouth daily. In the afternoon 90 capsule 3   escitalopram (LEXAPRO) 20 MG tablet Take 20 mg by mouth every morning.      furosemide (LASIX) 40 MG tablet Take 1 tablet (40 mg total) by mouth in the morning. (Patient taking differently: Take 20 mg by mouth in the morning.) 30 tablet 5   guaiFENesin (MUCINEX) 600 MG 12 hr tablet Take 1 tablet (600 mg total) by mouth 2 (two) times daily. 20 tablet 0   hydrOXYzine (ATARAX/VISTARIL) 25 MG tablet Take 25 mg by mouth 2 (two) times daily as needed for anxiety.     isosorbide mononitrate (IMDUR) 30 MG 24 hr tablet TAKE (1) TABLET BY MOUTH EACH MORNING. 90 tablet 2   isosorbide mononitrate (IMDUR) 60 MG 24 hr tablet Take 120 mg by mouth every morning.     levothyroxine (SYNTHROID, LEVOTHROID) 50 MCG tablet Take 50 mcg by mouth every morning.     meclizine (ANTIVERT) 25 MG tablet Take 25 mg by mouth 4  (four) times daily as needed for dizziness.     memantine (NAMENDA) 10 MG tablet Take 10 mg by mouth 2 (two) times daily.      MOVANTIK 25 MG TABS tablet Take 25 mg by mouth daily.     nitroGLYCERIN (NITROSTAT) 0.4 MG SL tablet Place 1 tablet (0.4 mg total) under the tongue every 5 (five) minutes as needed for chest pain. Pt has bought nitro bottle from home and oxycodone 30 mg ( 3 or 4 tabs) is in nitro bottle per wife Dyllan Hughett. 25 tablet 3   oxycodone (ROXICODONE) 30 MG immediate release tablet Take 30 mg by mouth every 4 (four) hours as needed for pain. Take 1 tablet by mouth every 4 to 6 hours as needed for pain.  Max 8 /24 hours.     pantoprazole (PROTONIX) 40 MG tablet Take  1 tablet (40 mg total) by mouth every morning. 30 tablet 1   potassium chloride SA (K-DUR,KLOR-CON) 20 MEQ tablet Take 20 mEq by mouth daily.      QUEtiapine (SEROQUEL) 25 MG tablet Take 25 mg by mouth at bedtime.     ranolazine (RANEXA) 500 MG 12 hr tablet Take 1 tablet (500 mg total) by mouth 2 (two) times daily. 60 tablet 11   rosuvastatin (CRESTOR) 40 MG tablet Take 1 tablet (40 mg total) by mouth daily at 6 PM. 30 tablet 0   sulfamethoxazole-trimethoprim (BACTRIM DS) 800-160 MG tablet Take 1 tablet by mouth 2 (two) times daily.     tamsulosin (FLOMAX) 0.4 MG CAPS capsule Take 1 capsule (0.4 mg total) by mouth daily. To help you pee. 30 capsule 3   Vitamin D, Ergocalciferol, (DRISDOL) 1.25 MG (50000 UNIT) CAPS capsule Take 50,000 Units by mouth once a week.     zinc sulfate 220 (50 Zn) MG capsule Take 1 capsule (220 mg total) by mouth daily. 30 capsule 1   No current facility-administered medications for this visit.   Allergies:  Patient has no known allergies.   ROS: Wearing a mask.  Physical Exam: VS:  BP (!) 128/56   Pulse 63   Ht 5\' 9"  (1.753 m)   Wt 218 lb 6.4 oz (99.1 kg)   SpO2 95%   BMI 32.25 kg/m , BMI Body mass index is 32.25 kg/m.  Wt Readings from Last 3 Encounters:  07/14/21 218 lb 6.4  oz (99.1 kg)  07/07/21 214 lb (97.1 kg)  06/09/21 218 lb 6.4 oz (99.1 kg)    General: Patient appears comfortable at rest. HEENT: Conjunctiva and lids normal, wearing a mask. Neck: Supple, no elevated JVP or carotid bruits, no thyromegaly. Lungs: Clear to auscultation, nonlabored breathing at rest. Cardiac: Regular rate and rhythm, no S3 or significant systolic murmur. Extremities: No pitting edema.  ECG:  An ECG dated 02/25/2021 was personally reviewed today and demonstrated:  Sinus rhythm with PVC and PAC, IVCD, repolarization abnormalities.  Recent Labwork: 09/21/2020: ALT 22; AST 24 02/10/2021: B Natriuretic Peptide 153.0 02/25/2021: BUN 21; Creatinine, Ser 1.03; Hemoglobin 12.7; Platelets 206; Potassium 4.0; Sodium 140     Component Value Date/Time   CHOL 143 12/11/2019 0438   TRIG 83 02/13/2020 0229   HDL 37 (L) 12/11/2019 0438   CHOLHDL 3.9 12/11/2019 0438   VLDL 29 12/11/2019 0438   LDLCALC 77 12/11/2019 0438    Other Studies Reviewed Today:  Cardiac catheterization 03/02/2021: Conclusions: Severe native coronary artery disease. Including 99% mid LAD stenosis with competitive flow from LIMA-LAD, as well as chronic total occlusions of proximal LCx and mid RCA. Widely patent LMCA stent. Widely patent LIMA-LAD. Patent SVG-OM1 with minimal liminal irregularities.  Jump limb to OM2 is chronically occluded. Chronically occlused SVG-D2 and SVG-rPDA. Mildly reduced left ventricular contraction with global hypokinesis (LVEF 45-50%). Moderately elevated left ventricular filling pressure (LVEDP 25-30 mmHg).   Recommendations: Escalate furosemide to 40 mg daily. Consider further escalation of antianginal therapy.  No interventional targets identified on today's catheterization. Consider echo to reassess LVEF and exclude other structural abnormalities that could be contributing to symptoms. Continue aggressive secondary prevention of coronary artery disease.  Assessment and  Plan:  1.  Multivessel CAD status post CABG with follow-up cardiac catheterization in March demonstrating both native vessel and graft disease to be managed medically, no clear culprit for PCI.  Continue aspirin, Plavix, Imdur, Ranexa, Lasix, Crestor, and as needed nitroglycerin.  2.  Mixed hyperlipidemia, doing well on Crestor.  He continues to follow with Dr. Gerarda Fraction.  3.  Ischemic cardiomyopathy, LVEF approximately 45 to 50%.  4.  Essential hypertension, blood pressure well controlled.  Medication Adjustments/Labs and Tests Ordered: Current medicines are reviewed at length with the patient today.  Concerns regarding medicines are outlined above.   Tests Ordered: No orders of the defined types were placed in this encounter.   Medication Changes: No orders of the defined types were placed in this encounter.   Disposition:  Follow up  6 months.  Signed, Satira Sark, MD, [facility] 07/14/2021 2:12 PM    [facility] [address] [place], [place] [address] Phone: [phone]; Fax: [phone]

## 2021-07-14 NOTE — Patient Instructions (Signed)
Medication Instructions:  Your physician recommends that you continue on your current medications as directed. Please refer to the Current Medication list given to you today.  *If you need a refill on your cardiac medications before your next appointment, please call your pharmacy*   Lab Work: None If you have labs (blood work) drawn today and your tests are completely normal, you will receive your results only by: MyChart Message (if you have MyChart) OR A paper copy in the mail If you have any lab test that is abnormal or we need to change your treatment, we will call you to review the results.   Testing/Procedures: None   Follow-Up: At [facility], you and your health needs are our priority.  As part of our continuing mission to provide you with exceptional heart care, we have created designated Provider Care Teams.  These Care Teams include your primary Cardiologist (physician) and Advanced Practice Providers (APPs -  Physician Assistants and Nurse Practitioners) who all work together to provide you with the care you need, when you need it.  We recommend signing up for the patient portal called "MyChart".  Sign up information is provided on this After Visit Summary.  MyChart is used to connect with patients for Virtual Visits (Telemedicine).  Patients are able to view lab/test results, encounter notes, upcoming appointments, etc.  Non-urgent messages can be sent to your provider as well.   To learn more about what you can do with MyChart, go to [email or web address].    Your next appointment:   6 month(s)  The format for your next appointment:   In Person  Provider:   Samuel McDowell, MD   Other Instructions    

## 2021-07-29 ENCOUNTER — Other Ambulatory Visit: Payer: Self-pay

## 2021-07-29 ENCOUNTER — Emergency Department (HOSPITAL_COMMUNITY)
Admission: EM | Admit: 2021-07-29 | Discharge: 2021-07-30 | Disposition: A | Discharge status: Home / Self Care | Payer: Medicare HMO | Source: Home / Self Care | Attending: Emergency Medicine | Admitting: Emergency Medicine

## 2021-07-29 ENCOUNTER — Emergency Department (HOSPITAL_COMMUNITY): Payer: Medicare HMO

## 2021-07-29 ENCOUNTER — Encounter (HOSPITAL_COMMUNITY): Payer: Self-pay | Admitting: *Deleted

## 2021-07-29 ENCOUNTER — Ambulatory Visit: Payer: Medicare HMO | Admitting: Pulmonary Disease

## 2021-07-29 DIAGNOSIS — Z79899 Other long term (current) drug therapy: Secondary | ICD-10-CM | POA: Insufficient documentation

## 2021-07-29 DIAGNOSIS — Z8616 Personal history of COVID-19: Secondary | ICD-10-CM | POA: Insufficient documentation

## 2021-07-29 DIAGNOSIS — J449 Chronic obstructive pulmonary disease, unspecified: Secondary | ICD-10-CM | POA: Insufficient documentation

## 2021-07-29 DIAGNOSIS — R4182 Altered mental status, unspecified: Secondary | ICD-10-CM | POA: Diagnosis not present

## 2021-07-29 DIAGNOSIS — F0280 Dementia in other diseases classified elsewhere without behavioral disturbance: Secondary | ICD-10-CM | POA: Insufficient documentation

## 2021-07-29 DIAGNOSIS — R531 Weakness: Secondary | ICD-10-CM | POA: Insufficient documentation

## 2021-07-29 DIAGNOSIS — R1031 Right lower quadrant pain: Secondary | ICD-10-CM | POA: Insufficient documentation

## 2021-07-29 DIAGNOSIS — E039 Hypothyroidism, unspecified: Secondary | ICD-10-CM | POA: Insufficient documentation

## 2021-07-29 DIAGNOSIS — R9431 Abnormal electrocardiogram [ECG] [EKG]: Secondary | ICD-10-CM | POA: Diagnosis not present

## 2021-07-29 DIAGNOSIS — I2510 Atherosclerotic heart disease of native coronary artery without angina pectoris: Secondary | ICD-10-CM | POA: Insufficient documentation

## 2021-07-29 DIAGNOSIS — M549 Dorsalgia, unspecified: Secondary | ICD-10-CM | POA: Insufficient documentation

## 2021-07-29 DIAGNOSIS — Z7902 Long term (current) use of antithrombotics/antiplatelets: Secondary | ICD-10-CM | POA: Insufficient documentation

## 2021-07-29 DIAGNOSIS — R339 Retention of urine, unspecified: Secondary | ICD-10-CM | POA: Insufficient documentation

## 2021-07-29 DIAGNOSIS — Z87891 Personal history of nicotine dependence: Secondary | ICD-10-CM | POA: Insufficient documentation

## 2021-07-29 DIAGNOSIS — Z86010 Personal history of colonic polyps: Secondary | ICD-10-CM | POA: Insufficient documentation

## 2021-07-29 DIAGNOSIS — Z7982 Long term (current) use of aspirin: Secondary | ICD-10-CM | POA: Insufficient documentation

## 2021-07-29 DIAGNOSIS — G309 Alzheimer's disease, unspecified: Secondary | ICD-10-CM | POA: Insufficient documentation

## 2021-07-29 DIAGNOSIS — I10 Essential (primary) hypertension: Secondary | ICD-10-CM | POA: Insufficient documentation

## 2021-07-29 DIAGNOSIS — I517 Cardiomegaly: Secondary | ICD-10-CM | POA: Diagnosis not present

## 2021-07-29 LAB — URINALYSIS, ROUTINE W REFLEX MICROSCOPIC
Bilirubin Urine: NEGATIVE
Glucose, UA: NEGATIVE mg/dL
Ketones, ur: 5 mg/dL — AB
Leukocytes,Ua: NEGATIVE
Nitrite: NEGATIVE
Protein, ur: 30 mg/dL — AB
Specific Gravity, Urine: 1.016 (ref 1.005–1.030)
pH: 5 (ref 5.0–8.0)

## 2021-07-29 LAB — CBC WITH DIFFERENTIAL/PLATELET
Abs Immature Granulocytes: 0.04 10*3/uL (ref 0.00–0.07)
Basophils Absolute: 0 10*3/uL (ref 0.0–0.1)
Basophils Relative: 0 %
Eosinophils Absolute: 0.1 10*3/uL (ref 0.0–0.5)
Eosinophils Relative: 1 %
HCT: 39.3 % (ref 39.0–52.0)
Hemoglobin: 12.9 g/dL — ABNORMAL LOW (ref 13.0–17.0)
Immature Granulocytes: 1 %
Lymphocytes Relative: 9 %
Lymphs Abs: 0.8 10*3/uL (ref 0.7–4.0)
MCH: 30.6 pg (ref 26.0–34.0)
MCHC: 32.8 g/dL (ref 30.0–36.0)
MCV: 93.1 fL (ref 80.0–100.0)
Monocytes Absolute: 0.7 10*3/uL (ref 0.1–1.0)
Monocytes Relative: 8 %
Neutro Abs: 6.9 10*3/uL (ref 1.7–7.7)
Neutrophils Relative %: 81 %
Platelets: 182 10*3/uL (ref 150–400)
RBC: 4.22 MIL/uL (ref 4.22–5.81)
RDW: 13.3 % (ref 11.5–15.5)
WBC: 8.6 10*3/uL (ref 4.0–10.5)
nRBC: 0 % (ref 0.0–0.2)

## 2021-07-29 LAB — I-STAT CHEM 8, ED
BUN: 16 mg/dL (ref 8–23)
Calcium, Ion: 1.17 mmol/L (ref 1.15–1.40)
Chloride: 97 mmol/L — ABNORMAL LOW (ref 98–111)
Creatinine, Ser: 1.1 mg/dL (ref 0.61–1.24)
Glucose, Bld: 121 mg/dL — ABNORMAL HIGH (ref 70–99)
HCT: 40 % (ref 39.0–52.0)
Hemoglobin: 13.6 g/dL (ref 13.0–17.0)
Potassium: 3.7 mmol/L (ref 3.5–5.1)
Sodium: 136 mmol/L (ref 135–145)
TCO2: 26 mmol/L (ref 22–32)

## 2021-07-29 LAB — APTT: aPTT: 31 seconds (ref 24–36)

## 2021-07-29 LAB — LACTIC ACID, PLASMA: Lactic Acid, Venous: 0.9 mmol/L (ref 0.5–1.9)

## 2021-07-29 LAB — PROTIME-INR
INR: 1.1 (ref 0.8–1.2)
Prothrombin Time: 13.9 seconds (ref 11.4–15.2)

## 2021-07-29 MED ORDER — SODIUM CHLORIDE 0.9 % IV BOLUS
500.0000 mL | Freq: Once | INTRAVENOUS | Status: AC
  Administered 2021-07-29: 500 mL via INTRAVENOUS

## 2021-07-29 MED ORDER — SODIUM CHLORIDE 0.9 % IV SOLN
2.0000 g | Freq: Once | INTRAVENOUS | Status: AC
  Administered 2021-07-29: 2 g via INTRAVENOUS
  Filled 2021-07-29: qty 20

## 2021-07-29 MED ORDER — LORAZEPAM 2 MG/ML IJ SOLN
0.5000 mg | Freq: Once | INTRAMUSCULAR | Status: AC
  Administered 2021-07-29: 0.5 mg via INTRAVENOUS
  Filled 2021-07-29: qty 1

## 2021-07-29 MED ORDER — IOHEXOL 300 MG/ML  SOLN
100.0000 mL | Freq: Once | INTRAMUSCULAR | Status: AC | PRN
  Administered 2021-07-29: 100 mL via INTRAVENOUS

## 2021-07-29 NOTE — ED Triage Notes (Signed)
Pt with lower back pain and not able to urinate for past 2 days.  Low grade fever in triage. More confused per pt's grandson.

## 2021-07-29 NOTE — ED Provider Notes (Signed)
[facility] EMERGENCY DEPARTMENT Provider Note   CSN: [id number] Arrival date & time: 07/29/21  2202     History Chief Complaint  Patient presents with   Urinary Retention    Dustin [name] is a 87 y.o. male.  Patient has not been able to urinate for 2 days and is more confused than normal for the last couple days.  History is according to his grandson  The history is provided by the patient and a relative. No language interpreter was used.  Weakness Severity:  Moderate Onset quality:  Sudden Timing:  Constant Progression:  Worsening Chronicity:  New Context: not alcohol use   Relieved by:  Nothing Worsened by:  Nothing Ineffective treatments:  None tried Associated symptoms: abdominal pain   Associated symptoms: no chest pain, no cough, no diarrhea, no frequency, no headaches and no seizures       Past Medical History:  Diagnosis Date   Alzheimer disease ([place])    Anxiety    Arthritis    Atrophic gastritis    a. By EGD 02/2013.   Carotid artery disease (HCC)    a. mild-mod plaque, <50% stenosis bilat by duplex 2018.   Coronary atherosclerosis of native coronary artery    a. Multivessel s/p CABG 1996. b. s/p DES x 2 SVG to PDA 8/12 with distal disease managed medically.   DDD (degenerative disc disease)    Chronic back pain   Enlarged prostate    Essential hypertension    Hematuria    Hypothyroidism    LBBB (left bundle branch block)    MI (myocardial infarction) (HCC)    Mixed hyperlipidemia    OA (osteoarthritis)    OSA (obstructive sleep apnea)    Pneumonia due to COVID-19 virus    February 2021   Sinus bradycardia    a. Aricept and BB discontinued due to this.    Patient Active Problem List   Diagnosis Date Noted   Acute metabolic encephalopathy 0000000   AKI (acute kidney injury) ([place]) 02/10/2021   Dysuria 10/24/2020   Pneumonia due to COVID-19 virus 02/13/2020   COPD (chronic obstructive pulmonary disease) ([place]) 12/10/2019   COPD  exacerbation ([place] [address]) 12/07/2019   Accelerating angina ([place]) 11/07/2018   Fall 11/07/2018   Gait instability 11/07/2018   Leukocytosis 11/07/2018   Depression 11/07/2018   Benign prostatic hyperplasia with urinary obstruction 11/07/2018   Chronic pain 11/07/2018   Pressure injury of skin 02/15/2017   Chronic diastolic CHF (congestive heart failure) ([place]) 12/24/2016   Sinus bradycardia 12/23/2016   Foot pain, bilateral 12/23/2016   Hypokalemia 04/05/2016   Acute respiratory failure ([place]) 04/03/2016   Acute encephalopathy 04/03/2016   Hypothyroidism 04/03/2016   Aspiration pneumonia ([place]) 04/03/2016   Dementia ([place]) 04/03/2016   Acute respiratory failure with hypoxia ([place]) 04/03/2016   Memory loss 04/20/2015   Hypoxia 01/27/2015   CAP (community acquired pneumonia) 01/27/2015   Fever 08/30/2014   Healthcare associated bacterial pneumonia 99991111   Toxic Metabolic encephalopathy 99991111   Sepsis ([place]) 08/30/2014   Neck pain on right side 08/29/2014   Neck pain 08/29/2014   Left-sided weakness 06/28/2014   Chest pain 06/27/2014   Tubular adenoma of colon 03/05/2013   Anorexia 03/05/2013   Loss of weight 03/05/2013   Chronic constipation 07/04/2012   Bronchitis 08/16/2011   Hyperlipidemia 10/01/2009   OSA (obstructive sleep apnea) 10/01/2009   Alzheimer disease ([place]) 10/01/2009   HYPERTENSION, BENIGN 10/01/2009   Coronary artery disease 10/01/2009    Past Surgical  History:  Procedure Laterality Date   COLONOSCOPY  08/03/2004   [place]-numerous large diverticula in the descending, transverse, descending, and sigmoid colon. Otherwise normal exam.   COLONOSCOPY  07/12/2012   RMR: External hemorrhoidal tag; multiple rectal and colonic polyps removed and/or treated as described above. Pancolonic diverticulosis. Bx-tubular adenomas, rectal hyperplastic polyp. next colonoscopy in 06/2015.   COLONOSCOPY N/A 06/22/2016   Procedure: COLONOSCOPY;  Surgeon: Aviva Signs, MD;  [facility]  [facility];  Service: Gastroenterology;  Laterality: N/A;  61   CORONARY ANGIOPLASTY WITH STENT PLACEMENT     CORONARY ARTERY BYPASS GRAFT  1996   LIMA to LAD, SVG to D2, SVG to PDA, SVG to OM1 and OM2   CORONARY STENT INTERVENTION N/A 12/10/2019   Procedure: CORONARY STENT INTERVENTION;  Surgeon: Burnell Blanks, MD;  Location: [place] [facility];  Service: Cardiovascular;  Laterality: N/A;   ESOPHAGOGASTRODUODENOSCOPY N/A 03/16/2013   Procedure: ESOPHAGOGASTRODUODENOSCOPY (EGD);  Surgeon: Daneil Dolin, MD;  Location[facility];  Service: Endoscopy;  Laterality: N/A;  12:00-moved to [place] notified pt   ESOPHAGOGASTRODUODENOSCOPY N/A 03/06/2013   Procedure: ESOPHAGOGASTRODUODENOSCOPY (EGD);  Surgeon: Daneil Dolin, MD;  [facility];  Service: Endoscopy;  Laterality: N/A;   HERNIA REPAIR     INTRAVASCULAR ULTRASOUND/IVUS N/A 12/10/2019   Procedure: Intravascular Ultrasound/IVUS;  Surgeon: Burnell Blanks, MD;  Location: [place] [facility];  Service: Cardiovascular;  Laterality: N/A;   LEFT HEART CATH AND CORS/GRAFTS ANGIOGRAPHY N/A 12/10/2019   Procedure: LEFT HEART CATH AND CORS/GRAFTS ANGIOGRAPHY;  Surgeon: Burnell Blanks, MD;  Location: [place] [facility];  Service: Cardiovascular;  Laterality: N/A;   LEFT HEART CATH AND CORS/GRAFTS ANGIOGRAPHY N/A 03/02/2021   Procedure: LEFT HEART CATH AND CORS/GRAFTS ANGIOGRAPHY;  Surgeon: Nelva Bush, MD;  Location: [place] [facility];  Service: Cardiovascular;  Laterality: N/A;       Family History  Problem Relation Age of Onset   Heart disease Other    Heart attack Mother    Colon cancer Neg Hx     Social History   Tobacco Use   Smoking status: Former    Packs/day: 2.00    Years: 40.00    Pack years: 80.00    Types: Cigarettes    Quit date: 12/27/1994    Years since quitting: 26.6   Smokeless tobacco: Never  Vaping Use   Vaping Use: Never used  Substance Use Topics   Alcohol  use: No    Alcohol/week: 0.0 standard drinks   Drug use: No    Home Medications Prior to Admission medications   Medication Sig Start Date End Date Taking? Authorizing Provider  albuterol (PROVENTIL) (2.5 MG/3ML) 0.083% nebulizer solution Take 3 mLs (2.5 mg total) by nebulization every 6 (six) hours as needed for wheezing or shortness of breath. Patient will also need the nebulizer machine with this prescription. 06/09/21   Chesley Mires, MD  albuterol (VENTOLIN HFA) 108 (90 Base) MCG/ACT inhaler Inhale 2 puffs into the lungs every 6 (six) hours as needed for wheezing or shortness of breath. 06/09/21   Chesley Mires, MD  alfuzosin (UROXATRAL) 10 MG 24 hr tablet Take 1 tablet (10 mg total) by mouth daily with breakfast. 03/09/21   McKenzie, Candee Furbish, MD  aspirin EC 81 MG tablet Take 81 mg by mouth daily.    [provider]  Budeson-Glycopyrrol-Formoterol (BREZTRI AEROSPHERE) 160-9-4.8 MCG/ACT AERO Inhale 2 puffs into the lungs in the morning and at bedtime. 06/09/21  Chesley Mires, MD  Budeson-Glycopyrrol-Formoterol (BREZTRI AEROSPHERE) 160-9-4.8 MCG/ACT AERO Inhale 2 puffs into the lungs in the morning and at bedtime. 06/09/21   Chesley Mires, MD  busPIRone (BUSPAR) 10 MG tablet Take 10 mg by mouth 2 (two) times daily. 11/03/18   [provider]  clopidogrel (PLAVIX) 75 MG tablet TAKE 1 TABLET BY MOUTH DAILY. 09/03/20   Satira Sark, MD  doxazosin (CARDURA) 1 MG tablet Take 1 mg by mouth at bedtime. 09/02/20   [provider]  dutasteride (AVODART) 0.5 MG capsule Take 1 capsule (0.5 mg total) by mouth daily. In the afternoon 03/09/21   Cleon Gustin, MD  escitalopram (LEXAPRO) 20 MG tablet Take 20 mg by mouth every morning.     [provider]  furosemide (LASIX) 40 MG tablet Take 1 tablet (40 mg total) by mouth in the morning. Patient taking differently: Take 20 mg by mouth in the morning. 03/02/21   End, Harrell Gave, MD  guaiFENesin (MUCINEX) 600 MG 12 hr  tablet Take 1 tablet (600 mg total) by mouth 2 (two) times daily. 02/12/21   Roxan Hockey, MD  hydrOXYzine (ATARAX/VISTARIL) 25 MG tablet Take 25 mg by mouth 2 (two) times daily as needed for anxiety. 11/19/19   [provider]  isosorbide mononitrate (IMDUR) 30 MG 24 hr tablet TAKE (1) TABLET BY MOUTH EACH MORNING. 05/08/21   Satira Sark, MD  isosorbide mononitrate (IMDUR) 60 MG 24 hr tablet Take 120 mg by mouth every morning. 02/17/21   [provider]  levothyroxine (SYNTHROID, LEVOTHROID) 50 MCG tablet Take 50 mcg by mouth every morning. 10/04/16   [provider]  meclizine (ANTIVERT) 25 MG tablet Take 25 mg by mouth 4 (four) times daily as needed for dizziness.    [provider]  memantine (NAMENDA) 10 MG tablet Take 10 mg by mouth 2 (two) times daily.  03/05/16   [provider]  MOVANTIK 25 MG TABS tablet Take 25 mg by mouth daily. 08/30/20   [provider]  nitroGLYCERIN (NITROSTAT) 0.4 MG SL tablet Place 1 tablet (0.4 mg total) under the tongue every 5 (five) minutes as needed for chest pain. Pt has bought nitro bottle from home and oxycodone 30 mg ( 3 or 4 tabs) is in nitro bottle per wife Rashaad Mazzanti. 11/04/20   Satira Sark, MD  oxycodone (ROXICODONE) 30 MG immediate release tablet Take 30 mg by mouth every 4 (four) hours as needed for pain. Take 1 tablet by mouth every 4 to 6 hours as needed for pain.  Max 8 /24 hours. 12/09/16   [provider]  pantoprazole (PROTONIX) 40 MG tablet Take 1 tablet (40 mg total) by mouth every morning. 02/16/20   Barton Dubois, MD  potassium chloride SA (K-DUR,KLOR-CON) 20 MEQ tablet Take 20 mEq by mouth daily.     [provider]  QUEtiapine (SEROQUEL) 25 MG tablet Take 25 mg by mouth at bedtime. 11/19/19   [provider]  ranolazine (RANEXA) 500 MG 12 hr tablet Take 1 tablet (500 mg total) by mouth 2 (two) times daily. 03/23/21   Imogene Burn, PA-C   rosuvastatin (CRESTOR) 40 MG tablet Take 1 tablet (40 mg total) by mouth daily at 6 PM. 12/11/19   Hollice Gong, Mir Earlie Server, MD  sulfamethoxazole-trimethoprim (BACTRIM DS) 800-160 MG tablet Take 1 tablet by mouth 2 (two) times daily.    [provider]  tamsulosin (FLOMAX) 0.4 MG CAPS capsule Take 1 capsule (0.4 mg total)  by mouth daily. To help you pee. 12/08/19   Enzo Bi, MD  Vitamin D, Ergocalciferol, (DRISDOL) 1.25 MG (50000 UNIT) CAPS capsule Take 50,000 Units by mouth once a week. 02/07/21   [provider]  zinc sulfate 220 (50 Zn) MG capsule Take 1 capsule (220 mg total) by mouth daily. 02/17/20   Barton Dubois, MD    Allergies    Patient has no known allergies.  Review of Systems   Review of Systems  Unable to perform ROS: Dementia  Constitutional:  Negative for appetite change and fatigue.  HENT:  Negative for congestion, ear discharge and sinus pressure.   Eyes:  Negative for discharge.  Respiratory:  Negative for cough.   Cardiovascular:  Negative for chest pain.  Gastrointestinal:  Positive for abdominal pain. Negative for diarrhea.  Genitourinary:  Negative for frequency and hematuria.  Musculoskeletal:  Negative for back pain.  Skin:  Negative for rash.  Neurological:  Positive for weakness. Negative for seizures and headaches.  Psychiatric/Behavioral:  Negative for hallucinations.    Physical Exam Updated Vital Signs BP (!) 136/118 (BP Location: Left Arm)   Pulse 72   Temp 99.6 F (37.6 C) (Oral)   Resp 18   Wt 95.3 kg   SpO2 91%   BMI 31.01 kg/m   Physical Exam Vitals and nursing note reviewed.  Constitutional:      Appearance: He is well-developed.  HENT:     Head: Normocephalic.     Mouth/Throat:     Mouth: Mucous membranes are moist.  Eyes:     General: No scleral icterus.    Conjunctiva/sclera: Conjunctivae normal.  Neck:     Thyroid: No thyromegaly.  Cardiovascular:     Rate and Rhythm: Normal rate and regular rhythm.      Heart sounds: No murmur heard.   No friction rub. No gallop.  Pulmonary:     Breath sounds: No stridor. No wheezing or rales.  Chest:     Chest wall: No tenderness.  Abdominal:     General: There is no distension.     Tenderness: There is abdominal tenderness. There is no rebound.     Comments: Tender right lower quadrant  Musculoskeletal:        General: Normal range of motion.     Cervical back: Neck supple.  Lymphadenopathy:     Cervical: No cervical adenopathy.  Skin:    Findings: No erythema or rash.  Neurological:     Mental Status: He is alert.     Motor: No abnormal muscle tone.     Coordination: Coordination normal.     Comments: Patient oriented to person only  Psychiatric:        Behavior: Behavior normal.    ED Results / Procedures / Treatments   Labs (all labs ordered are listed, but only abnormal results are displayed) Labs Reviewed  CULTURE, BLOOD (SINGLE)  URINE CULTURE  CBC WITH DIFFERENTIAL/PLATELET  COMPREHENSIVE METABOLIC PANEL  URINALYSIS, ROUTINE W REFLEX MICROSCOPIC  LACTIC ACID, PLASMA  LACTIC ACID, PLASMA  PROTIME-INR  APTT  I-STAT CHEM 8, ED    EKG None  Radiology No results found.  Procedures Procedures   Medications Ordered in ED Medications  sodium chloride 0.9 % bolus 500 mL (has no administration in time range)  cefTRIAXone (ROCEPHIN) 2 g in sodium chloride 0.9 % 100 mL IVPB (has no administration in time range)    ED Course  I have reviewed the triage vital signs and the nursing  notes.  Pertinent labs & imaging results that were available during my care of the patient were reviewed by me and considered in my medical decision making (see chart for details).  Patient with altered mental status and abdominal pain and back pain and urinary retention.  Septic labs have been sent.  Patient most likely has urinary tract infection.  He also get a CT of his head and a scan of his abdomen MDM Rules/Calculators/A&P                             Final Clinical Impression(s) / ED Diagnoses Final diagnoses:  None    Rx / DC Orders ED Discharge Orders     None        Milton Ferguson, MD 08/02/21 [phone]

## 2021-07-30 ENCOUNTER — Inpatient Hospital Stay (HOSPITAL_COMMUNITY)
Admission: EM | Admit: 2021-07-30 | Discharge: 2021-08-03 | DRG: 177 | Disposition: A | Discharge status: Home / Self Care | Payer: Medicare HMO | Attending: Internal Medicine | Admitting: Internal Medicine

## 2021-07-30 ENCOUNTER — Encounter (HOSPITAL_COMMUNITY): Payer: Self-pay | Admitting: Emergency Medicine

## 2021-07-30 ENCOUNTER — Ambulatory Visit: Payer: Medicare HMO | Admitting: Pulmonary Disease

## 2021-07-30 ENCOUNTER — Other Ambulatory Visit: Payer: Self-pay

## 2021-07-30 DIAGNOSIS — J449 Chronic obstructive pulmonary disease, unspecified: Secondary | ICD-10-CM | POA: Diagnosis present

## 2021-07-30 DIAGNOSIS — R0902 Hypoxemia: Secondary | ICD-10-CM | POA: Diagnosis present

## 2021-07-30 DIAGNOSIS — E039 Hypothyroidism, unspecified: Secondary | ICD-10-CM | POA: Diagnosis present

## 2021-07-30 DIAGNOSIS — Z8249 Family history of ischemic heart disease and other diseases of the circulatory system: Secondary | ICD-10-CM

## 2021-07-30 DIAGNOSIS — G4733 Obstructive sleep apnea (adult) (pediatric): Secondary | ICD-10-CM | POA: Diagnosis present

## 2021-07-30 DIAGNOSIS — Z7902 Long term (current) use of antithrombotics/antiplatelets: Secondary | ICD-10-CM

## 2021-07-30 DIAGNOSIS — J9600 Acute respiratory failure, unspecified whether with hypoxia or hypercapnia: Secondary | ICD-10-CM | POA: Diagnosis present

## 2021-07-30 DIAGNOSIS — G309 Alzheimer's disease, unspecified: Secondary | ICD-10-CM | POA: Diagnosis present

## 2021-07-30 DIAGNOSIS — J1282 Pneumonia due to coronavirus disease 2019: Secondary | ICD-10-CM | POA: Diagnosis present

## 2021-07-30 DIAGNOSIS — R509 Fever, unspecified: Secondary | ICD-10-CM | POA: Diagnosis not present

## 2021-07-30 DIAGNOSIS — Z87891 Personal history of nicotine dependence: Secondary | ICD-10-CM

## 2021-07-30 DIAGNOSIS — I2510 Atherosclerotic heart disease of native coronary artery without angina pectoris: Secondary | ICD-10-CM | POA: Diagnosis present

## 2021-07-30 DIAGNOSIS — Z955 Presence of coronary angioplasty implant and graft: Secondary | ICD-10-CM

## 2021-07-30 DIAGNOSIS — R410 Disorientation, unspecified: Secondary | ICD-10-CM

## 2021-07-30 DIAGNOSIS — E785 Hyperlipidemia, unspecified: Secondary | ICD-10-CM | POA: Diagnosis present

## 2021-07-30 DIAGNOSIS — Z7951 Long term (current) use of inhaled steroids: Secondary | ICD-10-CM

## 2021-07-30 DIAGNOSIS — R338 Other retention of urine: Secondary | ICD-10-CM | POA: Diagnosis present

## 2021-07-30 DIAGNOSIS — I110 Hypertensive heart disease with heart failure: Secondary | ICD-10-CM | POA: Diagnosis present

## 2021-07-30 DIAGNOSIS — Z8616 Personal history of COVID-19: Secondary | ICD-10-CM

## 2021-07-30 DIAGNOSIS — U071 COVID-19: Principal | ICD-10-CM

## 2021-07-30 DIAGNOSIS — I252 Old myocardial infarction: Secondary | ICD-10-CM

## 2021-07-30 DIAGNOSIS — F0280 Dementia in other diseases classified elsewhere without behavioral disturbance: Secondary | ICD-10-CM | POA: Diagnosis present

## 2021-07-30 DIAGNOSIS — I5032 Chronic diastolic (congestive) heart failure: Secondary | ICD-10-CM | POA: Diagnosis present

## 2021-07-30 DIAGNOSIS — E876 Hypokalemia: Secondary | ICD-10-CM | POA: Diagnosis present

## 2021-07-30 DIAGNOSIS — J440 Chronic obstructive pulmonary disease with acute lower respiratory infection: Secondary | ICD-10-CM | POA: Diagnosis present

## 2021-07-30 DIAGNOSIS — I10 Essential (primary) hypertension: Secondary | ICD-10-CM | POA: Diagnosis present

## 2021-07-30 DIAGNOSIS — R339 Retention of urine, unspecified: Secondary | ICD-10-CM | POA: Diagnosis not present

## 2021-07-30 DIAGNOSIS — G9341 Metabolic encephalopathy: Secondary | ICD-10-CM | POA: Diagnosis present

## 2021-07-30 DIAGNOSIS — Z79899 Other long term (current) drug therapy: Secondary | ICD-10-CM

## 2021-07-30 DIAGNOSIS — Z7982 Long term (current) use of aspirin: Secondary | ICD-10-CM

## 2021-07-30 DIAGNOSIS — F32A Depression, unspecified: Secondary | ICD-10-CM | POA: Diagnosis present

## 2021-07-30 DIAGNOSIS — J189 Pneumonia, unspecified organism: Secondary | ICD-10-CM

## 2021-07-30 DIAGNOSIS — M545 Low back pain, unspecified: Secondary | ICD-10-CM | POA: Diagnosis not present

## 2021-07-30 DIAGNOSIS — Z951 Presence of aortocoronary bypass graft: Secondary | ICD-10-CM

## 2021-07-30 DIAGNOSIS — G8929 Other chronic pain: Secondary | ICD-10-CM | POA: Diagnosis present

## 2021-07-30 DIAGNOSIS — F419 Anxiety disorder, unspecified: Secondary | ICD-10-CM | POA: Diagnosis present

## 2021-07-30 DIAGNOSIS — E782 Mixed hyperlipidemia: Secondary | ICD-10-CM | POA: Diagnosis present

## 2021-07-30 DIAGNOSIS — I255 Ischemic cardiomyopathy: Secondary | ICD-10-CM | POA: Diagnosis present

## 2021-07-30 DIAGNOSIS — Z79890 Hormone replacement therapy (postmenopausal): Secondary | ICD-10-CM

## 2021-07-30 DIAGNOSIS — M1990 Unspecified osteoarthritis, unspecified site: Secondary | ICD-10-CM | POA: Diagnosis present

## 2021-07-30 DIAGNOSIS — K5730 Diverticulosis of large intestine without perforation or abscess without bleeding: Secondary | ICD-10-CM | POA: Diagnosis not present

## 2021-07-30 DIAGNOSIS — N401 Enlarged prostate with lower urinary tract symptoms: Secondary | ICD-10-CM | POA: Diagnosis present

## 2021-07-30 LAB — COMPREHENSIVE METABOLIC PANEL
ALT: 12 U/L (ref 0–44)
AST: 18 U/L (ref 15–41)
Albumin: 4 g/dL (ref 3.5–5.0)
Alkaline Phosphatase: 52 U/L (ref 38–126)
Anion gap: 7 (ref 5–15)
BUN: 17 mg/dL (ref 8–23)
CO2: 27 mmol/L (ref 22–32)
Calcium: 8.6 mg/dL — ABNORMAL LOW (ref 8.9–10.3)
Chloride: 99 mmol/L (ref 98–111)
Creatinine, Ser: 1.1 mg/dL (ref 0.61–1.24)
GFR, Estimated: >60 mL/min (ref >60)
Glucose, Bld: 120 mg/dL — ABNORMAL HIGH (ref 70–99)
Potassium: 3.6 mmol/L (ref 3.5–5.1)
Sodium: 133 mmol/L — ABNORMAL LOW (ref 135–145)
Total Bilirubin: 1.7 mg/dL — ABNORMAL HIGH (ref 0.3–1.2)
Total Protein: 6.9 g/dL (ref 6.5–8.1)

## 2021-07-30 LAB — LACTIC ACID, PLASMA: Lactic Acid, Venous: 0.8 mmol/L (ref 0.5–1.9)

## 2021-07-30 NOTE — ED Provider Notes (Signed)
Emergency Medicine Provider Triage Evaluation Note  Dustin [name] , a 87 y.o. male  was evaluated in triage.  Pt complains of back pain.  The patient's son provides the history.  The patient was complaining of worsening low back pain, onset today.  He also adds that he has had difficulty urinating and dysuria.  The patient tested positive for COVID-19 today when he was seen and evaluated and underwent an extensive work-up at [facility].  The son present with him is unsure of the work-up as the patient was accompanied by another family member during his ED visit earlier today.  Patient is unsure of the last time that he voided.  No vomiting.  Review of Systems  Positive: Back pain, dysuria, decreased urinary output Negative: Vomiting, rash  Physical Exam  BP (!) 104/53 (BP Location: Right Arm)   Pulse 76   Temp 99.3 F (37.4 C) (Oral)   Resp 18   SpO2 92%  Gen:   Awake, no distress   Resp:  Normal effort  MSK:   Moves extremities without difficulty  Other:  Given is distended but soft.  Spine is nontender.  No CVA tenderness bilaterally.  Medical Decision Making  Medically screening exam initiated at 11:39 PM.  Appropriate orders placed.  DEKLYN APPLEWHITE was informed that the remainder of the evaluation will be completed by another provider, this initial triage assessment does not replace that evaluation, and the importance of remaining in the ED until their evaluation is complete.  Patient's medical record has been thoroughly reviewed.  He had an extensive work-up at [facility] within the last 24 hours.  However, given limited history due to dementia, will repeat basic screening labs, urinalysis.  Will order bladder scan if patient is not able to provide a urine specimen in triage.  He will require further work-up and evaluation in the ED.   Joanne Gavel, PA-C 07/30/21 2355    Quintella Reichert, MD 08/04/21 [phone]

## 2021-07-30 NOTE — ED Triage Notes (Signed)
Family reported patient tested positive for Covid today , reports low back pain for several days and mild dysuria . No fever or chills . Seen at [facility] yesterday for the same complaints .

## 2021-07-30 NOTE — ED Provider Notes (Signed)
  Physical Exam  BP (!) 128/55   Pulse 60   Temp 99.6 F (37.6 C) (Oral)   Resp 20   Wt 95.3 kg   SpO2 96%   BMI 31.01 kg/m   Physical Exam  ED Course/Procedures     Procedures  MDM  Care assumed from Dr. Roderic Palau at shift change.  Patient brought here with decreased urine output, low back discomfort, and perhaps low-grade fever.  He arrives here with a temperature of 99.6.  He had a negative COVID test at home yesterday.  Care signed out to me awaiting results of laboratory studies and CT scans.  These have all returned and are essentially unremarkable.  Foley catheter placed with low residual.  His urinalysis shows no evidence for infection.  In speaking with the grandson, the main complaint is that he has been "talking out of his head".  Patient to my exam seems appropriate.  He is oriented to person, place, time, and situation.  I have found nothing that I feel requires inpatient treatment.  At this point, I feel as though discharge is appropriate with follow-up with primary doctor.       Veryl Speak, MD 07/30/21 [phone]

## 2021-07-30 NOTE — Discharge Instructions (Addendum)
Follow-up with your primary doctor in the next few days, and return to the ER if symptoms significantly worsen or change.

## 2021-07-31 ENCOUNTER — Encounter (HOSPITAL_COMMUNITY): Payer: Self-pay | Admitting: Family Medicine

## 2021-07-31 ENCOUNTER — Inpatient Hospital Stay (HOSPITAL_COMMUNITY): Payer: Medicare HMO

## 2021-07-31 ENCOUNTER — Emergency Department (HOSPITAL_COMMUNITY): Payer: Medicare HMO

## 2021-07-31 DIAGNOSIS — N401 Enlarged prostate with lower urinary tract symptoms: Secondary | ICD-10-CM | POA: Diagnosis present

## 2021-07-31 DIAGNOSIS — R509 Fever, unspecified: Secondary | ICD-10-CM | POA: Diagnosis not present

## 2021-07-31 DIAGNOSIS — I252 Old myocardial infarction: Secondary | ICD-10-CM | POA: Diagnosis not present

## 2021-07-31 DIAGNOSIS — Z8616 Personal history of COVID-19: Secondary | ICD-10-CM | POA: Diagnosis not present

## 2021-07-31 DIAGNOSIS — G309 Alzheimer's disease, unspecified: Secondary | ICD-10-CM | POA: Diagnosis not present

## 2021-07-31 DIAGNOSIS — U071 COVID-19: Secondary | ICD-10-CM | POA: Diagnosis present

## 2021-07-31 DIAGNOSIS — I255 Ischemic cardiomyopathy: Secondary | ICD-10-CM | POA: Diagnosis present

## 2021-07-31 DIAGNOSIS — I5032 Chronic diastolic (congestive) heart failure: Secondary | ICD-10-CM | POA: Diagnosis not present

## 2021-07-31 DIAGNOSIS — M1990 Unspecified osteoarthritis, unspecified site: Secondary | ICD-10-CM | POA: Diagnosis present

## 2021-07-31 DIAGNOSIS — R338 Other retention of urine: Secondary | ICD-10-CM | POA: Diagnosis present

## 2021-07-31 DIAGNOSIS — G8929 Other chronic pain: Secondary | ICD-10-CM | POA: Diagnosis present

## 2021-07-31 DIAGNOSIS — R410 Disorientation, unspecified: Secondary | ICD-10-CM | POA: Diagnosis not present

## 2021-07-31 DIAGNOSIS — F0280 Dementia in other diseases classified elsewhere without behavioral disturbance: Secondary | ICD-10-CM | POA: Diagnosis not present

## 2021-07-31 DIAGNOSIS — F32A Depression, unspecified: Secondary | ICD-10-CM | POA: Diagnosis present

## 2021-07-31 DIAGNOSIS — Z955 Presence of coronary angioplasty implant and graft: Secondary | ICD-10-CM | POA: Diagnosis not present

## 2021-07-31 DIAGNOSIS — G9341 Metabolic encephalopathy: Secondary | ICD-10-CM

## 2021-07-31 DIAGNOSIS — I110 Hypertensive heart disease with heart failure: Secondary | ICD-10-CM | POA: Diagnosis not present

## 2021-07-31 DIAGNOSIS — J1282 Pneumonia due to coronavirus disease 2019: Secondary | ICD-10-CM | POA: Diagnosis not present

## 2021-07-31 DIAGNOSIS — E782 Mixed hyperlipidemia: Secondary | ICD-10-CM | POA: Diagnosis present

## 2021-07-31 DIAGNOSIS — E876 Hypokalemia: Secondary | ICD-10-CM | POA: Diagnosis present

## 2021-07-31 DIAGNOSIS — K5730 Diverticulosis of large intestine without perforation or abscess without bleeding: Secondary | ICD-10-CM | POA: Diagnosis not present

## 2021-07-31 DIAGNOSIS — I517 Cardiomegaly: Secondary | ICD-10-CM | POA: Diagnosis not present

## 2021-07-31 DIAGNOSIS — M545 Low back pain, unspecified: Secondary | ICD-10-CM | POA: Diagnosis not present

## 2021-07-31 DIAGNOSIS — J9600 Acute respiratory failure, unspecified whether with hypoxia or hypercapnia: Secondary | ICD-10-CM | POA: Diagnosis present

## 2021-07-31 DIAGNOSIS — R339 Retention of urine, unspecified: Secondary | ICD-10-CM | POA: Diagnosis not present

## 2021-07-31 DIAGNOSIS — J440 Chronic obstructive pulmonary disease with acute lower respiratory infection: Secondary | ICD-10-CM | POA: Diagnosis not present

## 2021-07-31 DIAGNOSIS — Z951 Presence of aortocoronary bypass graft: Secondary | ICD-10-CM | POA: Diagnosis not present

## 2021-07-31 DIAGNOSIS — R0602 Shortness of breath: Secondary | ICD-10-CM | POA: Diagnosis not present

## 2021-07-31 DIAGNOSIS — R0902 Hypoxemia: Secondary | ICD-10-CM | POA: Diagnosis present

## 2021-07-31 DIAGNOSIS — F419 Anxiety disorder, unspecified: Secondary | ICD-10-CM | POA: Diagnosis present

## 2021-07-31 DIAGNOSIS — I2510 Atherosclerotic heart disease of native coronary artery without angina pectoris: Secondary | ICD-10-CM | POA: Diagnosis present

## 2021-07-31 DIAGNOSIS — E039 Hypothyroidism, unspecified: Secondary | ICD-10-CM | POA: Diagnosis present

## 2021-07-31 DIAGNOSIS — J189 Pneumonia, unspecified organism: Secondary | ICD-10-CM | POA: Diagnosis present

## 2021-07-31 LAB — CBC WITH DIFFERENTIAL/PLATELET
Abs Immature Granulocytes: 0.02 10*3/uL (ref 0.00–0.07)
Basophils Absolute: 0 10*3/uL (ref 0.0–0.1)
Basophils Relative: 0 %
Eosinophils Absolute: 0 10*3/uL (ref 0.0–0.5)
Eosinophils Relative: 0 %
HCT: 40.1 % (ref 39.0–52.0)
Hemoglobin: 12.9 g/dL — ABNORMAL LOW (ref 13.0–17.0)
Immature Granulocytes: 0 %
Lymphocytes Relative: 9 %
Lymphs Abs: 0.7 10*3/uL (ref 0.7–4.0)
MCH: 29.7 pg (ref 26.0–34.0)
MCHC: 32.2 g/dL (ref 30.0–36.0)
MCV: 92.4 fL (ref 80.0–100.0)
Monocytes Absolute: 0.7 10*3/uL (ref 0.1–1.0)
Monocytes Relative: 9 %
Neutro Abs: 6.4 10*3/uL (ref 1.7–7.7)
Neutrophils Relative %: 82 %
Platelets: 187 10*3/uL (ref 150–400)
RBC: 4.34 MIL/uL (ref 4.22–5.81)
RDW: 13.3 % (ref 11.5–15.5)
WBC: 7.9 10*3/uL (ref 4.0–10.5)
nRBC: 0 % (ref 0.0–0.2)

## 2021-07-31 LAB — URINE CULTURE: Culture: NO GROWTH

## 2021-07-31 LAB — COMPREHENSIVE METABOLIC PANEL
ALT: 16 U/L (ref 0–44)
AST: 27 U/L (ref 15–41)
Albumin: 3.5 g/dL (ref 3.5–5.0)
Alkaline Phosphatase: 46 U/L (ref 38–126)
Anion gap: 10 (ref 5–15)
BUN: 17 mg/dL (ref 8–23)
CO2: 25 mmol/L (ref 22–32)
Calcium: 8.8 mg/dL — ABNORMAL LOW (ref 8.9–10.3)
Chloride: 98 mmol/L (ref 98–111)
Creatinine, Ser: 1.2 mg/dL (ref 0.61–1.24)
GFR, Estimated: 60 mL/min — ABNORMAL LOW (ref >60)
Glucose, Bld: 140 mg/dL — ABNORMAL HIGH (ref 70–99)
Potassium: 3.4 mmol/L — ABNORMAL LOW (ref 3.5–5.1)
Sodium: 133 mmol/L — ABNORMAL LOW (ref 135–145)
Total Bilirubin: 1.7 mg/dL — ABNORMAL HIGH (ref 0.3–1.2)
Total Protein: 6.5 g/dL (ref 6.5–8.1)

## 2021-07-31 LAB — URINALYSIS, ROUTINE W REFLEX MICROSCOPIC
Bilirubin Urine: NEGATIVE
Glucose, UA: NEGATIVE mg/dL
Ketones, ur: 5 mg/dL — AB
Leukocytes,Ua: NEGATIVE
Nitrite: NEGATIVE
Protein, ur: 30 mg/dL — AB
Specific Gravity, Urine: 1.021 (ref 1.005–1.030)
pH: 5 (ref 5.0–8.0)

## 2021-07-31 LAB — LACTATE DEHYDROGENASE: LDH: 161 U/L (ref 98–192)

## 2021-07-31 LAB — FIBRINOGEN: Fibrinogen: 526 mg/dL — ABNORMAL HIGH (ref 210–475)

## 2021-07-31 LAB — T4, FREE: Free T4: 1.01 ng/dL (ref 0.61–1.12)

## 2021-07-31 LAB — TSH: TSH: 2.295 u[IU]/mL (ref 0.350–4.500)

## 2021-07-31 LAB — MAGNESIUM: Magnesium: 1.9 mg/dL (ref 1.7–2.4)

## 2021-07-31 LAB — FERRITIN: Ferritin: 163 ng/mL (ref 24–336)

## 2021-07-31 LAB — PROCALCITONIN: Procalcitonin: <0.1 ng/mL

## 2021-07-31 MED ORDER — SODIUM CHLORIDE 0.9 % IV SOLN
500.0000 mg | INTRAVENOUS | Status: DC
  Administered 2021-07-31: 500 mg via INTRAVENOUS
  Filled 2021-07-31 (×2): qty 500

## 2021-07-31 MED ORDER — GUAIFENESIN-DM 100-10 MG/5ML PO SYRP: 10.0000 mL | ORAL_SOLUTION | ORAL | Status: DC | PRN

## 2021-07-31 MED ORDER — TAMSULOSIN HCL 0.4 MG PO CAPS: 0.4000 mg | ORAL_CAPSULE | Freq: Every day | ORAL | Status: DC

## 2021-07-31 MED ORDER — SODIUM CHLORIDE 0.9 % IV SOLN
100.0000 mg | INTRAVENOUS | Status: DC
  Administered 2021-08-01 – 2021-08-03 (×3): 100 mg via INTRAVENOUS
  Filled 2021-07-31 (×3): qty 20

## 2021-07-31 MED ORDER — SODIUM CHLORIDE 0.9 % IV SOLN: 250.0000 mL | INTRAVENOUS | Status: DC | PRN

## 2021-07-31 MED ORDER — POTASSIUM CHLORIDE CRYS ER 10 MEQ PO TBCR
10.0000 meq | EXTENDED_RELEASE_TABLET | Freq: Once | ORAL | Status: AC
  Administered 2021-07-31: 10 meq via ORAL
  Filled 2021-07-31: qty 1

## 2021-07-31 MED ORDER — METHYLPREDNISOLONE SODIUM SUCC 125 MG IJ SOLR: 125.0000 mg | Freq: Once | INTRAMUSCULAR | Status: DC | PRN

## 2021-07-31 MED ORDER — HYDROCODONE-ACETAMINOPHEN 5-325 MG PO TABS
1.0000 | ORAL_TABLET | Freq: Four times a day (QID) | ORAL | Status: DC | PRN
  Administered 2021-07-31 – 2021-08-03 (×4): 1 via ORAL
  Filled 2021-07-31 (×4): qty 1

## 2021-07-31 MED ORDER — BEBTELOVIMAB 175 MG/2 ML IV (EUA): 175.0000 mg | Freq: Once | INTRAMUSCULAR | Status: DC

## 2021-07-31 MED ORDER — ZINC SULFATE 220 (50 ZN) MG PO CAPS
220.0000 mg | ORAL_CAPSULE | Freq: Every day | ORAL | Status: DC
  Administered 2021-07-31 – 2021-08-03 (×4): 220 mg via ORAL
  Filled 2021-07-31 (×4): qty 1

## 2021-07-31 MED ORDER — QUETIAPINE FUMARATE 25 MG PO TABS
25.0000 mg | ORAL_TABLET | Freq: Every evening | ORAL | Status: DC | PRN
  Administered 2021-08-02: 25 mg via ORAL
  Filled 2021-07-31 (×2): qty 1

## 2021-07-31 MED ORDER — MELATONIN 5 MG PO TABS
5.0000 mg | ORAL_TABLET | Freq: Every day | ORAL | Status: DC
  Administered 2021-07-31 – 2021-08-02 (×3): 5 mg via ORAL
  Filled 2021-07-31 (×3): qty 1

## 2021-07-31 MED ORDER — LEVOTHYROXINE SODIUM 50 MCG PO TABS
50.0000 ug | ORAL_TABLET | Freq: Every morning | ORAL | Status: DC
  Administered 2021-08-01 – 2021-08-03 (×3): 50 ug via ORAL
  Filled 2021-07-31 (×3): qty 1

## 2021-07-31 MED ORDER — ISOSORBIDE MONONITRATE ER 60 MG PO TB24
60.0000 mg | ORAL_TABLET | Freq: Every morning | ORAL | Status: DC
  Administered 2021-08-01 – 2021-08-03 (×3): 60 mg via ORAL
  Filled 2021-07-31 (×3): qty 1

## 2021-07-31 MED ORDER — ALBUTEROL SULFATE HFA 108 (90 BASE) MCG/ACT IN AERS: 2.0000 | INHALATION_SPRAY | Freq: Four times a day (QID) | RESPIRATORY_TRACT | Status: DC | PRN

## 2021-07-31 MED ORDER — FAMOTIDINE IN NACL 20-0.9 MG/50ML-% IV SOLN: 20.0000 mg | Freq: Once | INTRAVENOUS | Status: DC | PRN

## 2021-07-31 MED ORDER — ASCORBIC ACID 500 MG PO TABS
500.0000 mg | ORAL_TABLET | Freq: Three times a day (TID) | ORAL | Status: DC
  Administered 2021-07-31 – 2021-08-03 (×9): 500 mg via ORAL
  Filled 2021-07-31 (×9): qty 1

## 2021-07-31 MED ORDER — FLUTICASONE FUROATE-VILANTEROL 200-25 MCG/INH IN AEPB
1.0000 | INHALATION_SPRAY | Freq: Every day | RESPIRATORY_TRACT | Status: DC
  Administered 2021-08-03: 1 via RESPIRATORY_TRACT
  Filled 2021-07-31: qty 28

## 2021-07-31 MED ORDER — HYDROXYZINE HCL 25 MG PO TABS: 25.0000 mg | ORAL_TABLET | Freq: Two times a day (BID) | ORAL | Status: DC | PRN

## 2021-07-31 MED ORDER — UMECLIDINIUM BROMIDE 62.5 MCG/INH IN AEPB
1.0000 | INHALATION_SPRAY | Freq: Every day | RESPIRATORY_TRACT | Status: DC
  Administered 2021-08-03: 1 via RESPIRATORY_TRACT
  Filled 2021-07-31: qty 7

## 2021-07-31 MED ORDER — CLOPIDOGREL BISULFATE 75 MG PO TABS
75.0000 mg | ORAL_TABLET | Freq: Every day | ORAL | Status: DC
  Administered 2021-08-01 – 2021-08-03 (×3): 75 mg via ORAL
  Filled 2021-07-31 (×3): qty 1

## 2021-07-31 MED ORDER — EPINEPHRINE 0.3 MG/0.3ML IJ SOAJ: 0.3000 mg | Freq: Once | INTRAMUSCULAR | Status: DC | PRN

## 2021-07-31 MED ORDER — SODIUM CHLORIDE 0.9 % IV SOLN
200.0000 mg | Freq: Once | INTRAVENOUS | Status: AC
  Administered 2021-07-31: 200 mg via INTRAVENOUS
  Filled 2021-07-31: qty 40

## 2021-07-31 MED ORDER — PANTOPRAZOLE SODIUM 40 MG PO TBEC
40.0000 mg | DELAYED_RELEASE_TABLET | Freq: Every morning | ORAL | Status: DC
  Administered 2021-08-01 – 2021-08-03 (×3): 40 mg via ORAL
  Filled 2021-07-31 (×3): qty 1

## 2021-07-31 MED ORDER — SODIUM CHLORIDE 0.9% FLUSH
3.0000 mL | Freq: Two times a day (BID) | INTRAVENOUS | Status: DC
  Administered 2021-07-31 – 2021-08-03 (×7): 3 mL via INTRAVENOUS

## 2021-07-31 MED ORDER — DIPHENHYDRAMINE HCL 50 MG/ML IJ SOLN: 50.0000 mg | Freq: Once | INTRAMUSCULAR | Status: DC | PRN

## 2021-07-31 MED ORDER — MEMANTINE HCL 10 MG PO TABS
10.0000 mg | ORAL_TABLET | Freq: Two times a day (BID) | ORAL | Status: DC
  Administered 2021-07-31 – 2021-08-03 (×6): 10 mg via ORAL
  Filled 2021-07-31 (×7): qty 1

## 2021-07-31 MED ORDER — ACETAMINOPHEN 500 MG PO TABS
1000.0000 mg | ORAL_TABLET | Freq: Once | ORAL | Status: AC
  Administered 2021-07-31: 1000 mg via ORAL
  Filled 2021-07-31: qty 2

## 2021-07-31 MED ORDER — DEXAMETHASONE SODIUM PHOSPHATE 10 MG/ML IJ SOLN
6.0000 mg | INTRAMUSCULAR | Status: DC
  Administered 2021-07-31 – 2021-08-02 (×3): 6 mg via INTRAVENOUS
  Filled 2021-07-31 (×3): qty 1

## 2021-07-31 MED ORDER — ENOXAPARIN SODIUM 40 MG/0.4ML IJ SOSY
40.0000 mg | PREFILLED_SYRINGE | INTRAMUSCULAR | Status: DC
  Administered 2021-07-31 – 2021-08-02 (×3): 40 mg via SUBCUTANEOUS
  Filled 2021-07-31 (×3): qty 0.4

## 2021-07-31 MED ORDER — ASPIRIN EC 81 MG PO TBEC
81.0000 mg | DELAYED_RELEASE_TABLET | Freq: Every day | ORAL | Status: DC
  Administered 2021-08-01 – 2021-08-03 (×3): 81 mg via ORAL
  Filled 2021-07-31 (×3): qty 1

## 2021-07-31 MED ORDER — DOXAZOSIN MESYLATE 1 MG PO TABS
1.0000 mg | ORAL_TABLET | Freq: Every day | ORAL | Status: DC
  Administered 2021-07-31 – 2021-08-02 (×3): 1 mg via ORAL
  Filled 2021-07-31 (×4): qty 1

## 2021-07-31 MED ORDER — BUSPIRONE HCL 10 MG PO TABS
10.0000 mg | ORAL_TABLET | Freq: Two times a day (BID) | ORAL | Status: DC
  Administered 2021-07-31 – 2021-08-03 (×6): 10 mg via ORAL
  Filled 2021-07-31 (×6): qty 1

## 2021-07-31 MED ORDER — SODIUM CHLORIDE 0.9% FLUSH: 3.0000 mL | INTRAVENOUS | Status: DC | PRN

## 2021-07-31 MED ORDER — ALBUTEROL SULFATE HFA 108 (90 BASE) MCG/ACT IN AERS: 2.0000 | INHALATION_SPRAY | Freq: Once | RESPIRATORY_TRACT | Status: DC | PRN

## 2021-07-31 MED ORDER — ROSUVASTATIN CALCIUM 20 MG PO TABS
40.0000 mg | ORAL_TABLET | Freq: Every day | ORAL | Status: DC
  Administered 2021-08-01 – 2021-08-02 (×2): 40 mg via ORAL
  Filled 2021-07-31 (×2): qty 2

## 2021-07-31 MED ORDER — NITROGLYCERIN 0.4 MG SL SUBL
0.4000 mg | SUBLINGUAL_TABLET | SUBLINGUAL | Status: DC | PRN
  Filled 2021-07-31: qty 1

## 2021-07-31 MED ORDER — ALBUTEROL SULFATE (2.5 MG/3ML) 0.083% IN NEBU: 2.5000 mg | INHALATION_SOLUTION | Freq: Four times a day (QID) | RESPIRATORY_TRACT | Status: DC | PRN

## 2021-07-31 MED ORDER — ALFUZOSIN HCL ER 10 MG PO TB24
10.0000 mg | ORAL_TABLET | Freq: Every day | ORAL | Status: DC
  Administered 2021-08-01 – 2021-08-03 (×3): 10 mg via ORAL
  Filled 2021-07-31 (×3): qty 1

## 2021-07-31 MED ORDER — BUDESON-GLYCOPYRROL-FORMOTEROL 160-9-4.8 MCG/ACT IN AERO: 2.0000 | INHALATION_SPRAY | Freq: Two times a day (BID) | RESPIRATORY_TRACT | Status: DC

## 2021-07-31 MED ORDER — NALOXEGOL OXALATE 25 MG PO TABS
25.0000 mg | ORAL_TABLET | Freq: Every day | ORAL | Status: DC
  Administered 2021-08-01 – 2021-08-03 (×3): 25 mg via ORAL
  Filled 2021-07-31 (×3): qty 1

## 2021-07-31 MED ORDER — POTASSIUM CHLORIDE CRYS ER 20 MEQ PO TBCR
20.0000 meq | EXTENDED_RELEASE_TABLET | Freq: Every day | ORAL | Status: DC
  Administered 2021-08-01 – 2021-08-03 (×3): 20 meq via ORAL
  Filled 2021-07-31 (×3): qty 1

## 2021-07-31 MED ORDER — SENNOSIDES-DOCUSATE SODIUM 8.6-50 MG PO TABS: 1.0000 | ORAL_TABLET | Freq: Every evening | ORAL | Status: DC | PRN

## 2021-07-31 MED ORDER — DUTASTERIDE 0.5 MG PO CAPS
0.5000 mg | ORAL_CAPSULE | Freq: Every day | ORAL | Status: DC
  Administered 2021-07-31 – 2021-08-03 (×4): 0.5 mg via ORAL
  Filled 2021-07-31 (×4): qty 1

## 2021-07-31 MED ORDER — FUROSEMIDE 40 MG PO TABS
40.0000 mg | ORAL_TABLET | Freq: Every day | ORAL | Status: DC
  Administered 2021-08-01: 40 mg via ORAL
  Filled 2021-07-31: qty 1

## 2021-07-31 MED ORDER — SODIUM CHLORIDE 0.9 % IV SOLN: 1.0000 g | INTRAVENOUS | Status: DC

## 2021-07-31 MED ORDER — SODIUM CHLORIDE 0.9 % IV SOLN
1.0000 g | Freq: Once | INTRAVENOUS | Status: AC
  Administered 2021-07-31: 1 g via INTRAVENOUS
  Filled 2021-07-31: qty 10

## 2021-07-31 MED ORDER — PHENAZOPYRIDINE HCL 100 MG PO TABS
95.0000 mg | ORAL_TABLET | Freq: Once | ORAL | Status: AC
  Administered 2021-07-31: 100 mg via ORAL
  Filled 2021-07-31: qty 1

## 2021-07-31 MED ORDER — RANOLAZINE ER 500 MG PO TB12
500.0000 mg | ORAL_TABLET | Freq: Two times a day (BID) | ORAL | Status: DC
  Administered 2021-07-31 – 2021-08-03 (×6): 500 mg via ORAL
  Filled 2021-07-31 (×7): qty 1

## 2021-07-31 MED ORDER — SODIUM CHLORIDE 0.9 % IV SOLN: INTRAVENOUS | Status: DC | PRN

## 2021-07-31 MED ORDER — ESCITALOPRAM OXALATE 10 MG PO TABS
20.0000 mg | ORAL_TABLET | Freq: Every morning | ORAL | Status: DC
  Administered 2021-08-01 – 2021-08-03 (×3): 20 mg via ORAL
  Filled 2021-07-31 (×3): qty 2

## 2021-07-31 MED ORDER — ACETAMINOPHEN 325 MG PO TABS: 650.0000 mg | ORAL_TABLET | Freq: Four times a day (QID) | ORAL | Status: DC | PRN

## 2021-07-31 NOTE — ED Notes (Signed)
Pt did not respond for vitals 

## 2021-07-31 NOTE — ED Notes (Signed)
Assisted to [facility] to use urinal. Repositioned. Verbalizes got enough to eat. Denies pain or other sx or complaints. VSS. CBIR. Attempted to call home, "phone was turned off".

## 2021-07-31 NOTE — ED Notes (Signed)
Chest xray at [facility]

## 2021-07-31 NOTE — ED Notes (Signed)
EDP in to see and update.

## 2021-07-31 NOTE — ED Notes (Signed)
EDP at [facility] 

## 2021-07-31 NOTE — ED Notes (Signed)
Bagged lunch and one 274m cup of ice water given to pt.

## 2021-07-31 NOTE — ED Notes (Signed)
Wife [phone] would like a call back and update immediately

## 2021-07-31 NOTE — ED Notes (Signed)
Dustin [name], daughter in law, [phone] would like an update when available

## 2021-07-31 NOTE — ED Notes (Signed)
Pt alert, NAD, calm, interactive, resps e/u, speaking in clear complete sentences. Admits to HA, back pain, some sob, some dizziness. VSS.

## 2021-07-31 NOTE — ED Notes (Signed)
Given IS, instructed use, pt demonstrated use.

## 2021-07-31 NOTE — ED Notes (Signed)
Bladder scanned pt with 146 mL residual. Assisted pt to stand and use urinal; 250 mL output. Urine sample obtained; sent culture down also.

## 2021-07-31 NOTE — H&P (Addendum)
History and Physical    Dustin [name] YKD:[id number] DOB: 1934-12-16 DOA: 07/30/2021  PCP: [name], MD Consultants:  pulmonology: Dr Halford Chessman, ophthalmology: Dr. Coralyn Pear, cardiology: Dr. Domenic Polite Patient coming from:  Home - lives with wife   Chief Complaint: confusion and covid 19   HPI: Dustin [name] is a 87 y.o. male with medical history significant of HTN, HLD, OSA on no cpap, alzheimers disease, CAD, hypothyroidism, chronic diastolic CHF who presented to ED with worsening confusion. Symptoms started about 2-3 days ago.  Was seen at [facility] on 07/29/21 for confusion. Had negative Ct of his head, Ct of his abdomen and was given 2g rocephin for possible UTI and sent home. Tested negative for covid at home on 07/29/2021. Returned to ER today with worsening confusion and positive covid 19 test at home. He is resting comfortably. He isn't quite sure why he is here and states he thinks covid 19. Denies any fever, chills, worsening shortness of breath, chest pain, palpitations, N/V/d, headache, vision changes or swelling in his legs. He has had a cough with white sputum x 1 month. Denies feeling tight or having increased wheezing. His wife tested positive for covid over a week ago and sounds like received paxlovid.    ED Course: vitals: bp: 131/62, HR: 68, RR: 22, oxygen: 94% room air, CTH on 8/3: negative for acute event. CXR today: new streaky right basilar airspace opacity, atelectasis vs. Infiltrate. No WBC, lactic acid wnl potassium of 3.4. tested positive for covid today at home. Asked to admit.   Review of Systems: As per HPI; otherwise review of systems reviewed and negative.   Ambulatory Status:  Ambulates without assistance  COVID Vaccine Status:  Moderna x 2 with boosters   Past Medical History:  Diagnosis Date   Alzheimer disease ([place])    Anxiety    Arthritis    Atrophic gastritis    a. By EGD 02/2013.   Carotid artery disease (HCC)    a. mild-mod plaque, <50% stenosis bilat  by duplex 2018.   Coronary atherosclerosis of native coronary artery    a. Multivessel s/p CABG 1996. b. s/p DES x 2 SVG to PDA 8/12 with distal disease managed medically.   DDD (degenerative disc disease)    Chronic back pain   Dementia (HCC)    Enlarged prostate    Essential hypertension    Hematuria    Hypothyroidism    LBBB (left bundle branch block)    MI (myocardial infarction) (HCC)    Mixed hyperlipidemia    OA (osteoarthritis)    OSA (obstructive sleep apnea)    Pneumonia due to COVID-19 virus    February 2021   Sinus bradycardia    a. Aricept and BB discontinued due to this.    Past Surgical History:  Procedure Laterality Date   COLONOSCOPY  08/03/2004   Jenkins-numerous large diverticula in the descending, transverse, descending, and sigmoid colon. Otherwise normal exam.   COLONOSCOPY  07/12/2012   RMR: External hemorrhoidal tag; multiple rectal and colonic polyps removed and/or treated as described above. Pancolonic diverticulosis. Bx-tubular adenomas, rectal hyperplastic polyp. next colonoscopy in 06/2015.   COLONOSCOPY N/A 06/22/2016   Procedure: COLONOSCOPY;  Surgeon: Aviva Signs, MD;  [facility];  Service: Gastroenterology;  Laterality: N/A;  9   CORONARY ANGIOPLASTY WITH STENT PLACEMENT     CORONARY ARTERY BYPASS GRAFT  1996   LIMA to LAD, SVG to D2, SVG to PDA, SVG to OM1 and OM2  CORONARY STENT INTERVENTION N/A 12/10/2019   Procedure: CORONARY STENT INTERVENTION;  Surgeon: Burnell Blanks, MD;  Location: [place] [facility];  Service: Cardiovascular;  Laterality: N/A;   ESOPHAGOGASTRODUODENOSCOPY N/A 03/16/2013   Procedure: ESOPHAGOGASTRODUODENOSCOPY (EGD);  Surgeon: Daneil Dolin, MD;  [facility];  Service: Endoscopy;  Laterality: N/A;  12:00-moved to [place] notified pt   ESOPHAGOGASTRODUODENOSCOPY N/A 03/06/2013   Procedure: ESOPHAGOGASTRODUODENOSCOPY (EGD);  Surgeon: Daneil Dolin, MD;  [facility];   Service: Endoscopy;  Laterality: N/A;   HERNIA REPAIR     INTRAVASCULAR ULTRASOUND/IVUS N/A 12/10/2019   Procedure: Intravascular Ultrasound/IVUS;  Surgeon: Burnell Blanks, MD;  Location: [place] [facility];  Service: Cardiovascular;  Laterality: N/A;   LEFT HEART CATH AND CORS/GRAFTS ANGIOGRAPHY N/A 12/10/2019   Procedure: LEFT HEART CATH AND CORS/GRAFTS ANGIOGRAPHY;  Surgeon: Burnell Blanks, MD;  Location: [place] [facility];  Service: Cardiovascular;  Laterality: N/A;   LEFT HEART CATH AND CORS/GRAFTS ANGIOGRAPHY N/A 03/02/2021   Procedure: LEFT HEART CATH AND CORS/GRAFTS ANGIOGRAPHY;  Surgeon: Nelva Bush, MD;  Location: [place] [facility];  Service: Cardiovascular;  Laterality: N/A;    Social History   Socioeconomic History   Marital status: Married    Spouse name: Not on file   Number of children: 3   Years of education: Not on file   Highest education level: Not on file  Occupational History   Occupation: retired    Fish farm manager: RETIRED  Tobacco Use   Smoking status: Former    Packs/day: 2.00    Years: 40.00    Pack years: 80.00    Types: Cigarettes    Quit date: 12/27/1994    Years since quitting: 26.6   Smokeless tobacco: Never  Vaping Use   Vaping Use: Never used  Substance and Sexual Activity   Alcohol use: No    Alcohol/week: 0.0 standard drinks   Drug use: No   Sexual activity: Not Currently  Other Topics Concern   Not on file  Social History Narrative   Lives w/ ailing wife.   Son brings meals 3x per week   Social Determinants of Health   Financial Resource Strain: Not on file  Food Insecurity: Not on file  Transportation Needs: Not on file  Physical Activity: Not on file  Stress: Not on file  Social Connections: Not on file  Intimate Partner Violence: Not on file    No Known Allergies  Family History  Problem Relation Age of Onset   Heart disease Other    Heart attack Mother    Colon cancer Neg Hx     Prior to Admission  medications   Medication Sig Start Date End Date Taking? Authorizing Provider  albuterol (PROVENTIL) (2.5 MG/3ML) 0.083% nebulizer solution Take 3 mLs (2.5 mg total) by nebulization every 6 (six) hours as needed for wheezing or shortness of breath. Patient will also need the nebulizer machine with this prescription. 06/09/21   Chesley Mires, MD  albuterol (VENTOLIN HFA) 108 (90 Base) MCG/ACT inhaler Inhale 2 puffs into the lungs every 6 (six) hours as needed for wheezing or shortness of breath. 06/09/21   Chesley Mires, MD  alfuzosin (UROXATRAL) 10 MG 24 hr tablet Take 1 tablet (10 mg total) by mouth daily with breakfast. 03/09/21   McKenzie, Candee Furbish, MD  aspirin EC 81 MG tablet Take 81 mg by mouth daily.    [provider]  Budeson-Glycopyrrol-Formoterol (BREZTRI AEROSPHERE) 160-9-4.8 MCG/ACT AERO Inhale 2  puffs into the lungs in the morning and at bedtime. 06/09/21   Chesley Mires, MD  Budeson-Glycopyrrol-Formoterol (BREZTRI AEROSPHERE) 160-9-4.8 MCG/ACT AERO Inhale 2 puffs into the lungs in the morning and at bedtime. 06/09/21   Chesley Mires, MD  busPIRone (BUSPAR) 10 MG tablet Take 10 mg by mouth 2 (two) times daily. 11/03/18   [provider]  clopidogrel (PLAVIX) 75 MG tablet TAKE 1 TABLET BY MOUTH DAILY. 09/03/20   Satira Sark, MD  doxazosin (CARDURA) 1 MG tablet Take 1 mg by mouth at bedtime. 09/02/20   [provider]  dutasteride (AVODART) 0.5 MG capsule Take 1 capsule (0.5 mg total) by mouth daily. In the afternoon 03/09/21   Cleon Gustin, MD  escitalopram (LEXAPRO) 20 MG tablet Take 20 mg by mouth every morning.     [provider]  furosemide (LASIX) 40 MG tablet Take 1 tablet (40 mg total) by mouth in the morning. Patient taking differently: Take 20 mg by mouth in the morning. 03/02/21   End, Harrell Gave, MD  guaiFENesin (MUCINEX) 600 MG 12 hr tablet Take 1 tablet (600 mg total) by mouth 2 (two) times daily. 02/12/21   Roxan Hockey, MD  hydrOXYzine  (ATARAX/VISTARIL) 25 MG tablet Take 25 mg by mouth 2 (two) times daily as needed for anxiety. 11/19/19   [provider]  isosorbide mononitrate (IMDUR) 30 MG 24 hr tablet TAKE (1) TABLET BY MOUTH EACH MORNING. 05/08/21   Satira Sark, MD  isosorbide mononitrate (IMDUR) 60 MG 24 hr tablet Take 120 mg by mouth every morning. 02/17/21   [provider]  levothyroxine (SYNTHROID, LEVOTHROID) 50 MCG tablet Take 50 mcg by mouth every morning. 10/04/16   [provider]  meclizine (ANTIVERT) 25 MG tablet Take 25 mg by mouth 4 (four) times daily as needed for dizziness.    [provider]  memantine (NAMENDA) 10 MG tablet Take 10 mg by mouth 2 (two) times daily.  03/05/16   [provider]  MOVANTIK 25 MG TABS tablet Take 25 mg by mouth daily. 08/30/20   [provider]  nitroGLYCERIN (NITROSTAT) 0.4 MG SL tablet Place 1 tablet (0.4 mg total) under the tongue every 5 (five) minutes as needed for chest pain. Pt has bought nitro bottle from home and oxycodone 30 mg ( 3 or 4 tabs) is in nitro bottle per wife Jeray Shugart. 11/04/20   Satira Sark, MD  oxycodone (ROXICODONE) 30 MG immediate release tablet Take 30 mg by mouth every 4 (four) hours as needed for pain. Take 1 tablet by mouth every 4 to 6 hours as needed for pain.  Max 8 /24 hours. 12/09/16   [provider]  pantoprazole (PROTONIX) 40 MG tablet Take 1 tablet (40 mg total) by mouth every morning. 02/16/20   Barton Dubois, MD  potassium chloride SA (K-DUR,KLOR-CON) 20 MEQ tablet Take 20 mEq by mouth daily.     [provider]  QUEtiapine (SEROQUEL) 25 MG tablet Take 25 mg by mouth at bedtime. 11/19/19   [provider]  ranolazine (RANEXA) 500 MG 12 hr tablet Take 1 tablet (500 mg total) by mouth 2 (two) times daily. 03/23/21   Imogene Burn, PA-C  rosuvastatin (CRESTOR) 40 MG tablet Take 1 tablet (40 mg total) by mouth daily at 6 PM. 12/11/19   Hollice Gong, Mir  Earlie Server, MD  sulfamethoxazole-trimethoprim (BACTRIM DS) 800-160 MG tablet Take 1 tablet by mouth 2 (two) times daily.    [provider]  tamsulosin (FLOMAX) 0.4 MG CAPS capsule Take 1 capsule (0.4 mg total) by mouth daily. To help you pee. 12/08/19   Enzo Bi, MD  Vitamin D, Ergocalciferol, (DRISDOL) 1.25 MG (50000 UNIT) CAPS capsule Take 50,000 Units by mouth once a week. 02/07/21   [provider]  zinc sulfate 220 (50 Zn) MG capsule Take 1 capsule (220 mg total) by mouth daily. 02/17/20   Barton Dubois, MD    Physical Exam: Vitals:   07/31/21 1215 07/31/21 1230 07/31/21 1245 07/31/21 1300  BP: (!) 129/59 (!) 127/58 (!) 117/50 132/75  Pulse: (!) 58 (!) 58 (!) 59 (!) 59  Resp:  17    Temp:      TempSrc:      SpO2: 91% 90% 96% 96%     General:  Appears calm and comfortable and is in NAD Eyes:  PERRL, EOMI, normal lids, iris ENT:  grossly normal hearing, lips & tongue, mmm; appropriate dentition Neck:  no LAD, masses or thyromegaly; no carotid bruits Cardiovascular:  RRR, no m/r/g. No LE edema.  Respiratory:   he has end expiratory wheezes occasionally throughout lung fields. No crackles or rales.   Normal respiratory effort. Abdomen:  soft, NT, ND, NABS Back:   normal alignment, no CVAT Skin:  no rash or induration seen on limited exam Musculoskeletal:  grossly normal tone BUE/BLE, good ROM, no bony abnormality Lower extremity:  No LE edema.  Limited foot exam with no ulcerations.  2+ distal pulses. Psychiatric:  grossly normal mood and affect, speech fluent and appropriate, alert and oriented to person, place, bday, president, month and year.  Neurologic:  CN 2-12 grossly intact, moves all extremities in coordinated fashion, sensation intact    Radiological Exams on Admission: Independently reviewed - see discussion in A/P where applicable  CT HEAD WO CONTRAST (5MM)  Result Date: 07/30/2021 CLINICAL DATA:  Cerebral hemorrhage suspected. Progressive  confusion. Low grade fever EXAM: CT HEAD WITHOUT CONTRAST TECHNIQUE: Contiguous axial images were obtained from the base of the skull through the vertex without intravenous contrast. COMPARISON:  None. FINDINGS: Brain: Normal anatomic configuration. Parenchymal volume loss is commensurate with the patient's age. Mild periventricular white matter changes are present likely reflecting the sequela of small vessel ischemia. Tiny remote lacunar infarct within the right putamen. No abnormal intra or extra-axial mass lesion or fluid collection. No abnormal mass effect or midline shift. No evidence of acute intracranial hemorrhage or infarct. Ventricular size is normal. Cerebellum unremarkable. Vascular: No asymmetric hyperdense vasculature at the skull base. Skull: Intact Sinuses/Orbits: Paranasal sinuses are clear. Orbits are unremarkable. Other: Mastoid air cells and middle ear cavities are clear. IMPRESSION: No acute intracranial abnormality.  Mild senescent change. Electronically Signed   By: Fidela Salisbury MD   On: 07/30/2021 00:18   CT ABDOMEN PELVIS W CONTRAST  Result Date: 07/30/2021 CLINICAL DATA:  Abdominal abscess/infection suspected. Low back pain, urinary retention, low-grade fever EXAM: CT ABDOMEN AND PELVIS WITH CONTRAST TECHNIQUE: Multidetector CT imaging of the abdomen and pelvis was performed using the standard protocol following bolus administration of intravenous contrast. CONTRAST:  129m OMNIPAQUE IOHEXOL 300 MG/ML  SOLN COMPARISON:  None. FINDINGS: Lower chest: Visualized lung bases are clear. Coronary artery bypass grafting has been performed. Cardiac size within normal limits. Hepatobiliary: No focal liver abnormality is seen. No gallstones, gallbladder wall thickening, or biliary dilatation. Pancreas: Unremarkable Spleen: Unremarkable Adrenals/Urinary Tract: The adrenal glands are unremarkable. The kidneys are normal in size and position. Multiple simple cortical cysts are seen  within the right  kidney. The kidneys are otherwise unremarkable. Foley catheter balloon seen within a decompressed bladder lumen. Stomach/Bowel: Extensive sigmoid diverticulosis. Moderate diverticulosis noted within the descending colon. The stomach, small bowel, and large bowel are otherwise unremarkable. No evidence of obstruction or focal inflammation. The appendix is unremarkable. No free intraperitoneal gas or fluid. Vascular/Lymphatic: Extensive aortoiliac atherosclerotic calcification. No aortic aneurysm. No pathologic adenopathy within the abdomen and pelvis. Reproductive: Prostate is unremarkable. Other: No abdominal wall hernia. Musculoskeletal: No acute bone abnormality. No lytic or blastic bone lesions are identified. IMPRESSION: No acute intra-abdominal pathology. No definite radiographic explanation for the patient's reported symptoms. Moderate to severe distal colonic diverticulosis without superimposed inflammatory change. Aortic Atherosclerosis (ICD10-I70.0). Electronically Signed   By: Fidela Salisbury MD   On: 07/30/2021 00:29   DG Chest Port 1 View  Result Date: 07/31/2021 CLINICAL DATA:  hx of delerium w PNA, now confused EXAM: PORTABLE CHEST 1 VIEW COMPARISON:  07/29/2021 FINDINGS: Post CABG changes. Stable cardiomegaly. Atherosclerotic calcification of the aortic knob. New streaky right basilar airspace opacity. No pleural effusion or pneumothorax. IMPRESSION: New streaky right basilar airspace opacity, atelectasis versus infiltrate. Electronically Signed   By: Davina Poke D.O.   On: 07/31/2021 12:03   DG Chest Port 1 View  Result Date: 07/29/2021 CLINICAL DATA:  Pain unable to urinate EXAM: PORTABLE CHEST 1 VIEW COMPARISON:  06/01/2021 FINDINGS: Post sternotomy changes. Mild cardiomegaly. No focal opacity, pleural effusion, or pneumothorax. Aortic atherosclerosis. IMPRESSION: No active disease.  Mild cardiomegaly Electronically Signed   By: Donavan Foil M.D.   On: 07/29/2021 23:23    EKG:  Independently reviewed.  NSR with rate 70 with IVCD; nonspecific ST changes with no evidence of acute ischemia. Similar to previous EKG.   Labs on Admission: I have personally reviewed the available labs and imaging studies at the time of the admission.  Pertinent labs:  Potassium: 3.4   Assessment/Plan Principal Problem:  Acute metabolic encephalopathy Is oriented and seems alert and at baseline with me; however, no family present to confirm at baseline. Has baseline dementia.  -protecting airway, answering questions correctly and following directions -likely secondary to infection with covid, possible CAP and COPD. Will continue to treat and follow clinically -cultures pending -metabolic labs pending -on 53m of oxycodone at home. Holding this until tomorrow as could also be contributing.   Active Problems:  Respiratory failure -requiring 2L of oxygen [place] which is new for him; however, he has home oxygen at home and tends to run 88-90% as long as he sits. He doesn't use his oxygen at home.  -does have covid, vs. CAP vs. COPD exac, so may have new demand for oxygen.  -he did pass his ambulatory test with pulm on 6/22 maintaining sats at 96% on room air.  -continue to wean as tolerated.   Hypokalemia Mag pending Replete x 1 and follow  Restart home potassium tomorrow    COPD (chronic obstructive pulmonary disease) ([place] Followed by pulmonology and seen on 6/14 for exacerbation. Was given prednisone and breztri with prn albuterol and antibiotics.  He has continued to have same cough and sputum production since that time. No increased dyspnea, sputum or cough. Doesn't really have any cardinal signs for COPD exacerbation. Does have occasional expiratory wheeze on exam -continue home inhalers and albuterol PRN -receiving steroids for covid and antibiotics for possible CAP.     COVID-19 virus infection -he is vaccinated and boosted, requiring oxygen, but overall very comfortable.   -starting  him on treatment nutraceutical bundle including zinc sulfate, vit D, vit C, and melatonin. -criteria met for steroids with need for oxygen.  -had discussion with family regarding EUA of remdesivir and overall poor studies regarding outcome with covid pneumonia. They would like to continue to proceed with this.  Contact and airborne precautions -IS/flutter valve  -albuterol inhaler prn -robitussin DM prn.     Right lower lobe pneumonia Not convinced he has pneumonia at this point. He is not short of breath from his baseline, no white count, no fevers. He has had sputum and cough with outpatient treatment for bronchitis x 1 month with no change in this. CXR not characteristic of covid pneumonia.   -checking 2V CXR and procalcitonin -CURB 65 score of 2 puts him in moderate risk group with 6.8% 30 day mortality. Will continue rocephin and azithromycin for CAP and follow clinically.  -encouraged IS -anti tussive with mucinex  -follow clinically and may be able to stop abx    HYPERTENSION, BENIGN Well controlled Continue home medication     Coronary artery disease Stable on admit  Recent cardiac cath in 02/2021 with findings below Severe native coronary artery disease. Including 99% mid LAD stenosis with competitive flow from LIMA-LAD, as well as chronic total occlusions of proximal LCx and mid RCA. Widely patent LMCA stent. Widely patent LIMA-LAD. Patent SVG-OM1 with minimal liminal irregularities.  Jump limb to OM2 is chronically occluded. Chronically occlused SVG-D2 and SVG-rPDA -continue home medication of crestor, plavix and ASA  -tele    Chronic diastolic CHF (congestive heart failure) ([place]) Stable on exam with no clinical or radiograph/lab findings of exacerbation.  EF from recent heart cath showed mildly reduced LV contraction with global hyopkinesis. EF: 45-50%.  Followed by cardiology outpatient Strict I/O and daily weights while in hospital  -continue home  lasix    Hyperlipidemia -last lipid panel in 2020 with LDL of 77.  -continue home statin     OSA (obstructive sleep apnea) -on no cpap    Alzheimer disease (HCC) Stable, continue namenda    Hypothyroidism  Will check TSH with worsening confusion. Was abnormal in 2020 with no access to his PCP records.  Continue home dosage.    Anxiety and depression Continue home lexapro, buspar.     There is no height or weight on file to calculate BMI.    Level of care: Telemetry Medical DVT prophylaxis:  Lovenox  Code Status:  Full - confirmed with patient Family Communication: None present; I spoke with the patient's daughter in law, Dustin [name] by telephone [phone] at the time of admission. Attempted to call wife multiple times, but not answering phone  Disposition Plan:  The patient is from: home  Anticipated d/c is to: home   Anticipated d/c date will depend on clinical response to treatment, but will likely need at least 2 midnight stay.  Requires inpatient hospitalization and is at significant risk of medically worsening, requires constant monitoring, IV abx, covid inpatient treatment that warrants inpatient stay.   Patient is currently: acutely ill Consults called: none  Admission status:  inpatient    Orma Flaming MD [name]   How to contact the [facility] Attending or Consulting provider [place] or covering provider during after hours [place], for this patient?  Check the care team in [facility] and look for a) attending/consulting [facility] provider listed and b) the [facility] team listed Log into [email or web address] and use [place]'s universal password to access. If you do not  have the password, please contact the hospital operator. Locate the [facility] provider you are looking for under [name] and page to a number that you can be directly reached. If you still have difficulty reaching the provider, please page the [facility] (Director on Call) for the Hospitalists listed on amion for  assistance.   07/31/2021, 1:46 PM

## 2021-07-31 NOTE — ED Provider Notes (Signed)
[facility] EMERGENCY DEPARTMENT Provider Note   CSN: [id number] Arrival date & time: 07/30/21  2255     History Chief Complaint  Patient presents with   Covid+ / Back pain     Dementia    Dustin [name] is a 87 y.o. male.  HPI Patient presents for evaluation for the second time in 36 hours to one of the [place] facilities. Yesterday was seen and evaluated at our affiliated facility with concern for dysuria and confusion.  That evaluation was generally reassuring, and he had a negative COVID test.  He now presents for concern of ongoing dysuria, as well as generalized weakness.  Patient is alone, and attempts were made to contact his wife, and son, these were unsuccessful.  His dementia does limit history somewhat, but he is awake, alert, oriented, seemingly providing details of his recent history appropriately.  Additional details are obtained on chart review, where there was family concern for confusion beyond baseline yesterday. Patient notes that his wife was confirmed COVID-positive a few days ago. He has no dyspnea, no vomiting, no nausea, no diarrhea. During this period of dysuria he had some relief with placement of catheter, transiently yesterday at our affiliated facility, but was not discharged with this in place.    Past Medical History:  Diagnosis Date   Alzheimer disease ([place])    Anxiety    Arthritis    Atrophic gastritis    a. By EGD 02/2013.   Carotid artery disease (HCC)    a. mild-mod plaque, <50% stenosis bilat by duplex 2018.   Coronary atherosclerosis of native coronary artery    a. Multivessel s/p CABG 1996. b. s/p DES x 2 SVG to PDA 8/12 with distal disease managed medically.   DDD (degenerative disc disease)    Chronic back pain   Dementia (HCC)    Enlarged prostate    Essential hypertension    Hematuria    Hypothyroidism    LBBB (left bundle branch block)    MI (myocardial infarction) (HCC)    Mixed hyperlipidemia    OA  (osteoarthritis)    OSA (obstructive sleep apnea)    Pneumonia due to COVID-19 virus    February 2021   Sinus bradycardia    a. Aricept and BB discontinued due to this.    Patient Active Problem List   Diagnosis Date Noted   Acute metabolic encephalopathy 0000000   AKI (acute kidney injury) ([place]) 02/10/2021   Dysuria 10/24/2020   Pneumonia due to COVID-19 virus 02/13/2020   COPD (chronic obstructive pulmonary disease) ([place]) 12/10/2019   COPD exacerbation ([place]) 12/07/2019   Accelerating angina ([place]) 11/07/2018   Fall 11/07/2018   Gait instability 11/07/2018   Leukocytosis 11/07/2018   Depression 11/07/2018   Benign prostatic hyperplasia with urinary obstruction 11/07/2018   Chronic pain 11/07/2018   Pressure injury of skin 02/15/2017   Chronic diastolic CHF (congestive heart failure) ([place]) 12/24/2016   Sinus bradycardia 12/23/2016   Foot pain, bilateral 12/23/2016   Hypokalemia 04/05/2016   Acute respiratory failure ([place]) 04/03/2016   Acute encephalopathy 04/03/2016   Hypothyroidism 04/03/2016   Aspiration pneumonia ([place]) 04/03/2016   Dementia ([place]) 04/03/2016   Acute respiratory failure with hypoxia ([place]) 04/03/2016   Memory loss 04/20/2015   Hypoxia 01/27/2015   CAP (community acquired pneumonia) 01/27/2015   Fever 08/30/2014   Healthcare associated bacterial pneumonia 99991111   Toxic Metabolic encephalopathy 99991111   Sepsis ([place]) 08/30/2014   Neck pain on right side 08/29/2014  Neck pain 08/29/2014   Left-sided weakness 06/28/2014   Chest pain 06/27/2014   Tubular adenoma of colon 03/05/2013   Anorexia 03/05/2013   Loss of weight 03/05/2013   Chronic constipation 07/04/2012   Bronchitis 08/16/2011   Hyperlipidemia 10/01/2009   OSA (obstructive sleep apnea) 10/01/2009   Alzheimer disease ([place]) 10/01/2009   HYPERTENSION, BENIGN 10/01/2009   Coronary artery disease 10/01/2009    Past Surgical History:  Procedure Laterality Date   COLONOSCOPY   08/03/2004   [place]-numerous large diverticula in the descending, transverse, descending, and sigmoid colon. Otherwise normal exam.   COLONOSCOPY  07/12/2012   RMR: External hemorrhoidal tag; multiple rectal and colonic polyps removed and/or treated as described above. Pancolonic diverticulosis. Bx-tubular adenomas, rectal hyperplastic polyp. next colonoscopy in 06/2015.   COLONOSCOPY N/A 06/22/2016   Procedure: COLONOSCOPY;  Surgeon: Aviva Signs, MD;  Location: [facility];  Service: Gastroenterology;  Laterality: N/A;  37   CORONARY ANGIOPLASTY WITH STENT PLACEMENT     CORONARY ARTERY BYPASS GRAFT  1996   LIMA to LAD, SVG to D2, SVG to PDA, SVG to OM1 and OM2   CORONARY STENT INTERVENTION N/A 12/10/2019   Procedure: CORONARY STENT INTERVENTION;  Surgeon: Burnell Blanks, MD;  Location: [place] [facility];  Service: Cardiovascular;  Laterality: N/A;   ESOPHAGOGASTRODUODENOSCOPY N/A 03/16/2013   Procedure: ESOPHAGOGASTRODUODENOSCOPY (EGD);  Surgeon: Daneil Dolin, MD;  Location[facility];  Service: Endoscopy;  Laterality: N/A;  12:00-moved to [place] notified pt   ESOPHAGOGASTRODUODENOSCOPY N/A 03/06/2013   Procedure: ESOPHAGOGASTRODUODENOSCOPY (EGD);  Surgeon: Daneil Dolin, MD;  [facility];  Service: Endoscopy;  Laterality: N/A;   HERNIA REPAIR     INTRAVASCULAR ULTRASOUND/IVUS N/A 12/10/2019   Procedure: Intravascular Ultrasound/IVUS;  Surgeon: Burnell Blanks, MD;  Location: [place] [facility];  Service: Cardiovascular;  Laterality: N/A;   LEFT HEART CATH AND CORS/GRAFTS ANGIOGRAPHY N/A 12/10/2019   Procedure: LEFT HEART CATH AND CORS/GRAFTS ANGIOGRAPHY;  Surgeon: Burnell Blanks, MD;  Location: [place] [facility];  Service: Cardiovascular;  Laterality: N/A;   LEFT HEART CATH AND CORS/GRAFTS ANGIOGRAPHY N/A 03/02/2021   Procedure: LEFT HEART CATH AND CORS/GRAFTS ANGIOGRAPHY;  Surgeon: Nelva Bush, MD;  Location: [place] [facility];   Service: Cardiovascular;  Laterality: N/A;       Family History  Problem Relation Age of Onset   Heart disease Other    Heart attack Mother    Colon cancer Neg Hx     Social History   Tobacco Use   Smoking status: Former    Packs/day: 2.00    Years: 40.00    Pack years: 80.00    Types: Cigarettes    Quit date: 12/27/1994    Years since quitting: 26.6   Smokeless tobacco: Never  Vaping Use   Vaping Use: Never used  Substance Use Topics   Alcohol use: No    Alcohol/week: 0.0 standard drinks   Drug use: No    Home Medications Prior to Admission medications   Medication Sig Start Date End Date Taking? Authorizing Provider  albuterol (PROVENTIL) (2.5 MG/3ML) 0.083% nebulizer solution Take 3 mLs (2.5 mg total) by nebulization every 6 (six) hours as needed for wheezing or shortness of breath. Patient will also need the nebulizer machine with this prescription. 06/09/21   Chesley Mires, MD  albuterol (VENTOLIN HFA) 108 (90 Base) MCG/ACT inhaler Inhale 2 puffs into the lungs every 6 (six) hours as needed for wheezing or shortness of  breath. 06/09/21   Chesley Mires, MD  alfuzosin (UROXATRAL) 10 MG 24 hr tablet Take 1 tablet (10 mg total) by mouth daily with breakfast. 03/09/21   McKenzie, Candee Furbish, MD  aspirin EC 81 MG tablet Take 81 mg by mouth daily.    [provider]  Budeson-Glycopyrrol-Formoterol (BREZTRI AEROSPHERE) 160-9-4.8 MCG/ACT AERO Inhale 2 puffs into the lungs in the morning and at bedtime. 06/09/21   Chesley Mires, MD  Budeson-Glycopyrrol-Formoterol (BREZTRI AEROSPHERE) 160-9-4.8 MCG/ACT AERO Inhale 2 puffs into the lungs in the morning and at bedtime. 06/09/21   Chesley Mires, MD  busPIRone (BUSPAR) 10 MG tablet Take 10 mg by mouth 2 (two) times daily. 11/03/18   [provider]  clopidogrel (PLAVIX) 75 MG tablet TAKE 1 TABLET BY MOUTH DAILY. 09/03/20   Satira Sark, MD  doxazosin (CARDURA) 1 MG tablet Take 1 mg by mouth at bedtime. 09/02/20   [provider]  dutasteride (AVODART) 0.5 MG capsule Take 1 capsule (0.5 mg total) by mouth daily. In the afternoon 03/09/21   Cleon Gustin, MD  escitalopram (LEXAPRO) 20 MG tablet Take 20 mg by mouth every morning.     [provider]  furosemide (LASIX) 40 MG tablet Take 1 tablet (40 mg total) by mouth in the morning. Patient taking differently: Take 20 mg by mouth in the morning. 03/02/21   End, Harrell Gave, MD  guaiFENesin (MUCINEX) 600 MG 12 hr tablet Take 1 tablet (600 mg total) by mouth 2 (two) times daily. 02/12/21   Roxan Hockey, MD  hydrOXYzine (ATARAX/VISTARIL) 25 MG tablet Take 25 mg by mouth 2 (two) times daily as needed for anxiety. 11/19/19   [provider]  isosorbide mononitrate (IMDUR) 30 MG 24 hr tablet TAKE (1) TABLET BY MOUTH EACH MORNING. 05/08/21   Satira Sark, MD  isosorbide mononitrate (IMDUR) 60 MG 24 hr tablet Take 120 mg by mouth every morning. 02/17/21   [provider]  levothyroxine (SYNTHROID, LEVOTHROID) 50 MCG tablet Take 50 mcg by mouth every morning. 10/04/16   [provider]  meclizine (ANTIVERT) 25 MG tablet Take 25 mg by mouth 4 (four) times daily as needed for dizziness.    [provider]  memantine (NAMENDA) 10 MG tablet Take 10 mg by mouth 2 (two) times daily.  03/05/16   [provider]  MOVANTIK 25 MG TABS tablet Take 25 mg by mouth daily. 08/30/20   [provider]  nitroGLYCERIN (NITROSTAT) 0.4 MG SL tablet Place 1 tablet (0.4 mg total) under the tongue every 5 (five) minutes as needed for chest pain. Pt has bought nitro bottle from home and oxycodone 30 mg ( 3 or 4 tabs) is in nitro bottle per wife Zorawar Remedios. 11/04/20   Satira Sark, MD  oxycodone (ROXICODONE) 30 MG immediate release tablet Take 30 mg by mouth every 4 (four) hours as needed for pain. Take 1 tablet by mouth every 4 to 6 hours as needed for pain.  Max 8 /24 hours. 12/09/16   [provider]   pantoprazole (PROTONIX) 40 MG tablet Take 1 tablet (40 mg total) by mouth every morning. 02/16/20   Barton Dubois, MD  potassium chloride SA (K-DUR,KLOR-CON) 20 MEQ tablet Take 20 mEq by mouth daily.     [provider]  QUEtiapine (SEROQUEL) 25 MG tablet Take 25 mg by mouth at bedtime. 11/19/19   [provider]  ranolazine (RANEXA) 500 MG 12 hr tablet Take 1 tablet (500 mg total)  by mouth 2 (two) times daily. 03/23/21   Imogene Burn, PA-C  rosuvastatin (CRESTOR) 40 MG tablet Take 1 tablet (40 mg total) by mouth daily at 6 PM. 12/11/19   Hollice Gong, Mir Earlie Server, MD  sulfamethoxazole-trimethoprim (BACTRIM DS) 800-160 MG tablet Take 1 tablet by mouth 2 (two) times daily.    [provider]  tamsulosin (FLOMAX) 0.4 MG CAPS capsule Take 1 capsule (0.4 mg total) by mouth daily. To help you pee. 12/08/19   Enzo Bi, MD  Vitamin D, Ergocalciferol, (DRISDOL) 1.25 MG (50000 UNIT) CAPS capsule Take 50,000 Units by mouth once a week. 02/07/21   [provider]  zinc sulfate 220 (50 Zn) MG capsule Take 1 capsule (220 mg total) by mouth daily. 02/17/20   Barton Dubois, MD    Allergies    Patient has no known allergies.  Review of Systems   Review of Systems  Constitutional:        Per HPI, otherwise negative  HENT:         Per HPI, otherwise negative  Respiratory:         Per HPI, otherwise negative  Cardiovascular:        Per HPI, otherwise negative  Gastrointestinal:  Negative for vomiting.  Endocrine:       Negative aside from HPI  Genitourinary:        Neg aside from HPI   Musculoskeletal:        Per HPI, otherwise negative  Skin: Negative.   Neurological:  Positive for weakness. Negative for syncope.   Physical Exam Updated Vital Signs BP (!) 149/97   Pulse 72   Temp 99.3 F (37.4 C) (Oral)   Resp 17   SpO2 93%   Physical Exam Vitals and nursing note reviewed.  Constitutional:      General: He is not in acute distress.    Appearance:  He is well-developed.     Comments: Elderly male sleeping, awakens easily, in no distress  HENT:     Head: Normocephalic and atraumatic.  Eyes:     Conjunctiva/sclera: Conjunctivae normal.  Cardiovascular:     Rate and Rhythm: Normal rate and regular rhythm.  Pulmonary:     Effort: Pulmonary effort is normal. No respiratory distress.     Breath sounds: No stridor.  Abdominal:     General: There is no distension.     Tenderness: There is no abdominal tenderness. There is no guarding.  Skin:    General: Skin is warm and dry.  Neurological:     Mental Status: He is alert and oriented to person, place, and time.  Psychiatric:        Mood and Affect: Mood normal.        Behavior: Behavior normal.    ED Results / Procedures / Treatments   Labs (all labs ordered are listed, but only abnormal results are displayed) Labs Reviewed  CBC WITH DIFFERENTIAL/PLATELET - Abnormal; Notable for the following components:      Result Value   Hemoglobin 12.9 (*)    All other components within normal limits  COMPREHENSIVE METABOLIC PANEL - Abnormal; Notable for the following components:   Sodium 133 (*)    Potassium 3.4 (*)    Glucose, Bld 140 (*)    Calcium 8.8 (*)    Total Bilirubin 1.7 (*)    GFR, Estimated 60 (*)    All other components within normal limits  URINALYSIS, ROUTINE W REFLEX MICROSCOPIC - Abnormal; Notable for the  following components:   APPearance HAZY (*)    Hgb urine dipstick MODERATE (*)    Ketones, ur 5 (*)    Protein, ur 30 (*)    Bacteria, UA RARE (*)    All other components within normal limits    EKG None  Radiology CT HEAD WO CONTRAST (5MM)  Result Date: 07/30/2021 CLINICAL DATA:  Cerebral hemorrhage suspected. Progressive confusion. Low grade fever EXAM: CT HEAD WITHOUT CONTRAST TECHNIQUE: Contiguous axial images were obtained from the base of the skull through the vertex without intravenous contrast. COMPARISON:  None. FINDINGS: Brain: Normal anatomic  configuration. Parenchymal volume loss is commensurate with the patient's age. Mild periventricular white matter changes are present likely reflecting the sequela of small vessel ischemia. Tiny remote lacunar infarct within the right putamen. No abnormal intra or extra-axial mass lesion or fluid collection. No abnormal mass effect or midline shift. No evidence of acute intracranial hemorrhage or infarct. Ventricular size is normal. Cerebellum unremarkable. Vascular: No asymmetric hyperdense vasculature at the skull base. Skull: Intact Sinuses/Orbits: Paranasal sinuses are clear. Orbits are unremarkable. Other: Mastoid air cells and middle ear cavities are clear. IMPRESSION: No acute intracranial abnormality.  Mild senescent change. Electronically Signed   By: Fidela Salisbury MD   On: 07/30/2021 00:18   CT ABDOMEN PELVIS W CONTRAST  Result Date: 07/30/2021 CLINICAL DATA:  Abdominal abscess/infection suspected. Low back pain, urinary retention, low-grade fever EXAM: CT ABDOMEN AND PELVIS WITH CONTRAST TECHNIQUE: Multidetector CT imaging of the abdomen and pelvis was performed using the standard protocol following bolus administration of intravenous contrast. CONTRAST:  132m OMNIPAQUE IOHEXOL 300 MG/ML  SOLN COMPARISON:  None. FINDINGS: Lower chest: Visualized lung bases are clear. Coronary artery bypass grafting has been performed. Cardiac size within normal limits. Hepatobiliary: No focal liver abnormality is seen. No gallstones, gallbladder wall thickening, or biliary dilatation. Pancreas: Unremarkable Spleen: Unremarkable Adrenals/Urinary Tract: The adrenal glands are unremarkable. The kidneys are normal in size and position. Multiple simple cortical cysts are seen within the right kidney. The kidneys are otherwise unremarkable. Foley catheter balloon seen within a decompressed bladder lumen. Stomach/Bowel: Extensive sigmoid diverticulosis. Moderate diverticulosis noted within the descending colon. The stomach,  small bowel, and large bowel are otherwise unremarkable. No evidence of obstruction or focal inflammation. The appendix is unremarkable. No free intraperitoneal gas or fluid. Vascular/Lymphatic: Extensive aortoiliac atherosclerotic calcification. No aortic aneurysm. No pathologic adenopathy within the abdomen and pelvis. Reproductive: Prostate is unremarkable. Other: No abdominal wall hernia. Musculoskeletal: No acute bone abnormality. No lytic or blastic bone lesions are identified. IMPRESSION: No acute intra-abdominal pathology. No definite radiographic explanation for the patient's reported symptoms. Moderate to severe distal colonic diverticulosis without superimposed inflammatory change. Aortic Atherosclerosis (ICD10-I70.0). Electronically Signed   By: AFidela SalisburyMD   On: 07/30/2021 00:29   DG Chest Port 1 View  Result Date: 07/31/2021 CLINICAL DATA:  hx of delerium w PNA, now confused EXAM: PORTABLE CHEST 1 VIEW COMPARISON:  07/29/2021 FINDINGS: Post CABG changes. Stable cardiomegaly. Atherosclerotic calcification of the aortic knob. New streaky right basilar airspace opacity. No pleural effusion or pneumothorax. IMPRESSION: New streaky right basilar airspace opacity, atelectasis versus infiltrate. Electronically Signed   By: NDavina PokeD.O.   On: 07/31/2021 12:03   DG Chest Port 1 View  Result Date: 07/29/2021 CLINICAL DATA:  Pain unable to urinate EXAM: PORTABLE CHEST 1 VIEW COMPARISON:  06/01/2021 FINDINGS: Post sternotomy changes. Mild cardiomegaly. No focal opacity, pleural effusion, or pneumothorax. Aortic atherosclerosis. IMPRESSION: No active disease.  Mild cardiomegaly Electronically Signed   By: Donavan Foil M.D.   On: 07/29/2021 23:23    Procedures Procedures   Medications Ordered in ED Medications  acetaminophen (TYLENOL) tablet 1,000 mg (1,000 mg Oral Given 07/31/21 1009)  phenazopyridine (PYRIDIUM) tablet 100 mg (100 mg Oral Given 07/31/21 1010)    ED Course  I have  reviewed the triage vital signs and the nursing notes.  Pertinent labs & imaging results that were available during my care of the patient were reviewed by me and considered in my medical decision making (see chart for details).  Beyond labs, urinalysis ordered here patient had CT scan abdomen pelvis, head performed within the past 48 hours.  These were reviewed, reassuring, as above.  Update: Bladder scan notable for 1 5 0 mL urine, but patient was able to produce almost quarter liter spontaneously.  Update:, Urinalysis unremarkable, as above, CT unremarkable, low suspicion for stone.  Patient has had urine culture sent.  10:17 AM Patient awakens easily.  We discussed his COVID-positive diagnosis, reassuring vitals, labs, CT, patient amenable to medication for dysuria, headache, initiation of antivirals.  Absent hemodynamic instability, alarming head CT, abdominal pelvis CT, distress, and his denial of any new weakness, no evidence of bacteremia, sepsis, some suspicion for his condition being secondary to COVID, patient appropriate for discharge with close outpatient follow-up which he states he can execute. Date: I discussed the patient's case with his daughter-in-law after some difficulty obtaining family member.  She notes the patient is substantially more confused than baseline, acknowledging his history of dementia.  On repeat exam he remains in similar condition.  12:34 PM X-ray consistent with pneumonia, and given his borderline pulse oximetry, worsening confusion, concurrent COVID status patient started antibiotics, monoclonal antibody, require admission for further monitoring, management. MDM Rules/Calculators/A&P MDM Number of Diagnoses or Management Options Community acquired pneumonia of right upper lobe of lung: new, needed workup COVID: new, needed workup Delirium: established, worsening   Amount and/or Complexity of Data Reviewed Clinical lab tests: ordered and  reviewed Tests in the radiology section of CPT: ordered and reviewed Tests in the medicine section of CPT: reviewed and ordered Decide to obtain previous medical records or to obtain history from someone other than the patient: yes Obtain history from someone other than the patient: yes Review and summarize past medical records: yes Discuss the patient with other providers: yes Independent visualization of images, tracings, or specimens: yes  Risk of Complications, Morbidity, and/or Mortality Presenting problems: high Diagnostic procedures: high Management options: high  Critical Care Total time providing critical care: < 30 minutes  Patient Progress Patient progress: stable   Final Clinical Impression(s) / ED Diagnoses Final diagnoses:  Delirium  Community acquired pneumonia of right upper lobe of lung  COVID     Carmin Muskrat, MD 07/31/21 1236

## 2021-08-01 LAB — COMPREHENSIVE METABOLIC PANEL
ALT: 21 U/L (ref 0–44)
AST: 33 U/L (ref 15–41)
Albumin: 3.1 g/dL — ABNORMAL LOW (ref 3.5–5.0)
Alkaline Phosphatase: 38 U/L (ref 38–126)
Anion gap: 8 (ref 5–15)
BUN: 22 mg/dL (ref 8–23)
CO2: 28 mmol/L (ref 22–32)
Calcium: 8.3 mg/dL — ABNORMAL LOW (ref 8.9–10.3)
Chloride: 99 mmol/L (ref 98–111)
Creatinine, Ser: 0.99 mg/dL (ref 0.61–1.24)
GFR, Estimated: >60 mL/min (ref >60)
Glucose, Bld: 121 mg/dL — ABNORMAL HIGH (ref 70–99)
Potassium: 3.5 mmol/L (ref 3.5–5.1)
Sodium: 135 mmol/L (ref 135–145)
Total Bilirubin: 1 mg/dL (ref 0.3–1.2)
Total Protein: 5.8 g/dL — ABNORMAL LOW (ref 6.5–8.1)

## 2021-08-01 LAB — CBC WITH DIFFERENTIAL/PLATELET
Abs Immature Granulocytes: 0.02 10*3/uL (ref 0.00–0.07)
Basophils Absolute: 0 10*3/uL (ref 0.0–0.1)
Basophils Relative: 0 %
Eosinophils Absolute: 0 10*3/uL (ref 0.0–0.5)
Eosinophils Relative: 0 %
HCT: 38.1 % — ABNORMAL LOW (ref 39.0–52.0)
Hemoglobin: 12.7 g/dL — ABNORMAL LOW (ref 13.0–17.0)
Immature Granulocytes: 0 %
Lymphocytes Relative: 13 %
Lymphs Abs: 0.7 10*3/uL (ref 0.7–4.0)
MCH: 30.2 pg (ref 26.0–34.0)
MCHC: 33.3 g/dL (ref 30.0–36.0)
MCV: 90.7 fL (ref 80.0–100.0)
Monocytes Absolute: 0.6 10*3/uL (ref 0.1–1.0)
Monocytes Relative: 11 %
Neutro Abs: 3.9 10*3/uL (ref 1.7–7.7)
Neutrophils Relative %: 76 %
Platelets: 162 10*3/uL (ref 150–400)
RBC: 4.2 MIL/uL — ABNORMAL LOW (ref 4.22–5.81)
RDW: 13.1 % (ref 11.5–15.5)
WBC: 5.2 10*3/uL (ref 4.0–10.5)
nRBC: 0 % (ref 0.0–0.2)

## 2021-08-01 LAB — C-REACTIVE PROTEIN: CRP: 6.6 mg/dL — ABNORMAL HIGH (ref <1.0)

## 2021-08-01 LAB — PROCALCITONIN: Procalcitonin: <0.1 ng/mL

## 2021-08-01 LAB — D-DIMER, QUANTITATIVE: D-Dimer, Quant: 0.54 ug/mL-FEU — ABNORMAL HIGH (ref 0.00–0.50)

## 2021-08-01 MED ORDER — DOXYCYCLINE HYCLATE 100 MG PO TABS
100.0000 mg | ORAL_TABLET | Freq: Two times a day (BID) | ORAL | Status: DC
  Administered 2021-08-01 (×2): 100 mg via ORAL
  Filled 2021-08-01 (×2): qty 1

## 2021-08-01 NOTE — Hospital Course (Signed)
82 white male community dwelling [place] supposed to be on CPAP at bedtime COVID 01/2020 Chronic pain opiate dependence-hospitalization 01/2021 encephalopathy secondary to inadvertent opiate OD S/p 5V CABG-2020 cath patent grafts-had DES placed in distal L main at that time-EF 45-50% Moderate to severe Alzheimer's on meds BPH Hypothyroid  Seen in cardiology office 07/14/2021-diuresis escalated per Dr. Fonnie Jarvis to ED 8/3 decreased urine output slight confusion per grandson LBP T-max 99.6 with negative home COVID test-was sent home Reevaluated 8/5 [name], ED dysuria positive for COVID-found to have new oxygen requirements 2 L Remdesivir started steroids started Rx Rocephin azithromycin 2/2 possible pneumonia on imaging electrolytes replaced potassium-- Chronic pain meds oxycodone held (has had encephalopathy in the past with this)

## 2021-08-01 NOTE — Plan of Care (Signed)
  Problem: Education: Goal: Knowledge of risk factors and measures for prevention of condition will improve Outcome: Progressing   Problem: Coping: Goal: Psychosocial and spiritual needs will be supported Outcome: Progressing   Problem: Respiratory: Goal: Will maintain a patent airway Outcome: Progressing Goal: Complications related to the disease process, condition or treatment will be avoided or minimized Outcome: Progressing   

## 2021-08-01 NOTE — Progress Notes (Signed)
Pt called out requesting nitroglycerin tabs for chest discomfort. Meds taken to the room and upon assessment, pt denied chest pain/discomfort. Med not given.

## 2021-08-01 NOTE — Progress Notes (Signed)
PROGRESS NOTE   Dustin [name]  [id number] DOB: [birth year] DOA: 07/30/2021 PCP: [name], MD  Brief Narrative:  33 white male community dwelling Known COPD-prior smoker-OSA supposed to be on CPAP at bedtime COVID 01/2020 Chronic pain opiate dependence-hospitalization 01/2021 encephalopathy secondary to inadvertent opiate OD S/p 5V CABG-2020 cath patent grafts-had DES placed in distal L main at that time-EF 45-50% Moderate to severe Alzheimer's on meds BPH Hypothyroid  Seen in cardiology office 07/14/2021-diuresis escalated per Dr. Fonnie Jarvis to ED 8/3 decreased urine output slight confusion per grandson LBP T-max 99.6 with negative home COVID test-was sent home Reevaluated 8/5 [name], ED dysuria positive for COVID-found to have new oxygen requirements 2 L Remdesivir started steroids started Rx Rocephin azithromycin 2/2 possible pneumonia on imaging electrolytes replaced potassium-- Chronic pain meds oxycodone held (has had encephalopathy in the past with this)   Hospital-Problem based course  Covid 19 PNA-Mild  Recent Labs    07/31/21 1545 08/01/21 0824  DDIMER  --  0.54*  FERRITIN 163  --   LDH 161  --   CRP  --  6.6*   Trend labs for the next several days-continue empiric remdesivir, Decadron 6 mg q24 at current doses, follow procalcitonin Desat screen done at bedside--no O2 req-- patient satting over 95% sitting up in bed Ambulate, I-S recording about 1200  Known COPD prior habituation tobacco, OSA CPAP at night Change ceftriaxone azithromycin--> p.o. Doxy Albuterol 2.5 every 6 as needed Breo daily and continue Robitussin  CABG X5 re-in-stent stenosis EF chronically 45% last cath Lasix held from admission, continue Imdur 60 Ranexa 500 twice daily Crestor 40 and Imdur 60-aspirin 81 Plavix 75+ Asymptomatic at this time  Depression comorbid chronic pain +?  Dementia Hold Roxicodone every 4 as needed (prior admission for encephalopathy secondary to  this)--use very low-dose of Norco every 6 as needed moderate pain Careful use BuSpar 10 twice daily Lexapro 20, Namenda 10 twice daily Seroquel 25 at night as needed if needs to sleep-have discontinued hydroxyzine  BPH Continue alfuzosin 10, Avodart 0.5   Hypokalemia-resolved  DVT prophylaxis: Lovenox prophylactic dosing at this time Code Status: Full Family Communication: Called on telephone wife did not reach her-called Annice Needy [phone] and updated fully Disposition:  Status is: Inpatient  Remains inpatient appropriate because:Hemodynamically unstable, Ongoing active pain requiring inpatient pain management, Altered mental status, and Ongoing diagnostic testing needed not appropriate for outpatient work up  Dispo: The patient is from: Home              Anticipated d/c is to: Home              Patient currently is not medically stable to d/c.   Difficult to place patient No  Procedures:   Antimicrobials: doxy    Subjective: Well coherent No distress currently--no cp no fever no diarrh  Objective: Vitals:   07/31/21 2100 07/31/21 2143 08/01/21 0553 08/01/21 0751  BP: 134/67 (!) 143/60 (!) 151/53 (!) 163/66  Pulse: (!) 58 61 (!) 52 (!) 52  Resp: (!) 21   17  Temp:  97.8 F (36.6 C) 98 F (36.7 C) 97.7 F (36.5 C)  TempSrc:  Oral Oral Oral  SpO2: 98% 100% 97% 99%  Weight:  96.4 kg      Intake/Output Summary (Last 24 hours) at 08/01/2021 0909 Last data filed at 08/01/2021 0300 Gross per 24 hour  Intake 790 ml  Output 1075 ml  Net -285 ml   [employer]  07/31/21 2143  Weight: 96.4 kg    Examination:  Awake coherent white male looking younger than stated age no icterus no pallor Mallampati 4 S1-S2 cannot appreciate murmur Abdomen soft chest clear Off of oxygen patient does not desat-is able to pull 1200 cc with I-S   Data Reviewed: personally reviewed   CBC    Component Value Date/Time   WBC 5.2 08/01/2021 0330   RBC 4.20 (L) 08/01/2021 0330    HGB 12.7 (L) 08/01/2021 0330   HGB 12.7 (L) 02/25/2021 1258   HCT 38.1 (L) 08/01/2021 0330   HCT 38.7 02/25/2021 1258   HCT 43 01/26/2013 0000   PLT 162 08/01/2021 0330   PLT 206 02/25/2021 1258   MCV 90.7 08/01/2021 0330   MCV 93 02/25/2021 1258   MCH 30.2 08/01/2021 0330   MCHC 33.3 08/01/2021 0330   RDW 13.1 08/01/2021 0330   RDW 12.8 02/25/2021 1258   LYMPHSABS 0.7 08/01/2021 0330   LYMPHSABS 2.3 02/25/2021 1258   MONOABS 0.6 08/01/2021 0330   EOSABS 0.0 08/01/2021 0330   EOSABS 0.1 02/25/2021 1258   BASOSABS 0.0 08/01/2021 0330   BASOSABS 0.0 02/25/2021 1258   CMP Latest Ref Rng & Units 08/01/2021 07/30/2021 07/29/2021  Glucose 70 - 99 mg/dL 121(H) 140(H) 120(H)  BUN 8 - 23 mg/dL '22 17 17  '$ Creatinine 0.61 - 1.24 mg/dL 0.99 1.20 1.10  Sodium 135 - 145 mmol/L 135 133(L) 133(L)  Potassium 3.5 - 5.1 mmol/L 3.5 3.4(L) 3.6  Chloride 98 - 111 mmol/L 99 98 99  CO2 22 - 32 mmol/L '28 25 27  '$ Calcium 8.9 - 10.3 mg/dL 8.3(L) 8.8(L) 8.6(L)  Total Protein 6.5 - 8.1 g/dL 5.8(L) 6.5 6.9  Total Bilirubin 0.3 - 1.2 mg/dL 1.0 1.7(H) 1.7(H)  Alkaline Phos 38 - 126 U/L 38 46 52  AST 15 - 41 U/L 33 27 18  ALT 0 - 44 U/L '21 16 12     '$ Radiology Studies: DG Chest Port 1 View  Result Date: 07/31/2021 CLINICAL DATA:  Positive COVID test shortness of breath EXAM: PORTABLE CHEST 1 VIEW COMPARISON:  07/31/2021, 07/29/2021, 06/12/2020 FINDINGS: Post sternotomy changes. Mild cardiomegaly. No consolidation, pleural effusion, or pneumothorax. IMPRESSION: No active disease. Cardiomegaly with scarring or atelectasis at the bases Electronically Signed   By: Donavan Foil M.D.   On: 07/31/2021 16:31   DG Chest Port 1 View  Result Date: 07/31/2021 CLINICAL DATA:  hx of delerium w PNA, now confused EXAM: PORTABLE CHEST 1 VIEW COMPARISON:  07/29/2021 FINDINGS: Post CABG changes. Stable cardiomegaly. Atherosclerotic calcification of the aortic knob. New streaky right basilar airspace opacity. No pleural effusion or  pneumothorax. IMPRESSION: New streaky right basilar airspace opacity, atelectasis versus infiltrate. Electronically Signed   By: Davina Poke D.O.   On: 07/31/2021 12:03     Scheduled Meds:  alfuzosin  10 mg Oral Q breakfast   vitamin C  500 mg Oral TID   aspirin EC  81 mg Oral Daily   busPIRone  10 mg Oral BID   clopidogrel  75 mg Oral Daily   dexamethasone (DECADRON) injection  6 mg Intravenous Q24H   doxazosin  1 mg Oral QHS   dutasteride  0.5 mg Oral Daily   enoxaparin (LOVENOX) injection  40 mg Subcutaneous Q24H   escitalopram  20 mg Oral q morning   fluticasone furoate-vilanterol  1 puff Inhalation Daily   And   umeclidinium bromide  1 puff Inhalation Daily   furosemide  40 mg  Oral Daily   isosorbide mononitrate  60 mg Oral q morning   levothyroxine  50 mcg Oral q morning   melatonin  5 mg Oral QHS   memantine  10 mg Oral BID   naloxegol oxalate  25 mg Oral Daily   pantoprazole  40 mg Oral q morning   potassium chloride SA  20 mEq Oral Daily   ranolazine  500 mg Oral BID   rosuvastatin  40 mg Oral q1800   sodium chloride flush  3 mL Intravenous Q12H   zinc sulfate  220 mg Oral Daily   Continuous Infusions:  sodium chloride     azithromycin Stopped (07/31/21 1416)   cefTRIAXone (ROCEPHIN)  IV     remdesivir 100 mg in NS 100 mL       LOS: 1 day   Time spent: [place], MD [name] To contact the attending provider between 7A-7P or the covering provider during after hours 7P-7A, please log into the web site [email or web address] and access using universal [place] password for that web site. If you do not have the password, please call the hospital operator.  08/01/2021, 9:09 AM

## 2021-08-02 LAB — CBC WITH DIFFERENTIAL/PLATELET
Abs Immature Granulocytes: 0.01 10*3/uL (ref 0.00–0.07)
Basophils Absolute: 0 10*3/uL (ref 0.0–0.1)
Basophils Relative: 0 %
Eosinophils Absolute: 0 10*3/uL (ref 0.0–0.5)
Eosinophils Relative: 0 %
HCT: 37.9 % — ABNORMAL LOW (ref 39.0–52.0)
Hemoglobin: 12.6 g/dL — ABNORMAL LOW (ref 13.0–17.0)
Immature Granulocytes: 0 %
Lymphocytes Relative: 18 %
Lymphs Abs: 0.7 10*3/uL (ref 0.7–4.0)
MCH: 30.3 pg (ref 26.0–34.0)
MCHC: 33.2 g/dL (ref 30.0–36.0)
MCV: 91.1 fL (ref 80.0–100.0)
Monocytes Absolute: 0.3 10*3/uL (ref 0.1–1.0)
Monocytes Relative: 8 %
Neutro Abs: 2.8 10*3/uL (ref 1.7–7.7)
Neutrophils Relative %: 74 %
Platelets: 177 10*3/uL (ref 150–400)
RBC: 4.16 MIL/uL — ABNORMAL LOW (ref 4.22–5.81)
RDW: 13.1 % (ref 11.5–15.5)
WBC: 3.8 10*3/uL — ABNORMAL LOW (ref 4.0–10.5)
nRBC: 0 % (ref 0.0–0.2)

## 2021-08-02 LAB — COMPREHENSIVE METABOLIC PANEL
ALT: 21 U/L (ref 0–44)
AST: 28 U/L (ref 15–41)
Albumin: 3 g/dL — ABNORMAL LOW (ref 3.5–5.0)
Alkaline Phosphatase: 35 U/L — ABNORMAL LOW (ref 38–126)
Anion gap: 7 (ref 5–15)
BUN: 17 mg/dL (ref 8–23)
CO2: 26 mmol/L (ref 22–32)
Calcium: 8.4 mg/dL — ABNORMAL LOW (ref 8.9–10.3)
Chloride: 105 mmol/L (ref 98–111)
Creatinine, Ser: 0.97 mg/dL (ref 0.61–1.24)
GFR, Estimated: >60 mL/min (ref >60)
Glucose, Bld: 143 mg/dL — ABNORMAL HIGH (ref 70–99)
Potassium: 3.6 mmol/L (ref 3.5–5.1)
Sodium: 138 mmol/L (ref 135–145)
Total Bilirubin: 0.7 mg/dL (ref 0.3–1.2)
Total Protein: 5.6 g/dL — ABNORMAL LOW (ref 6.5–8.1)

## 2021-08-02 LAB — C-REACTIVE PROTEIN: CRP: 3.8 mg/dL — ABNORMAL HIGH (ref <1.0)

## 2021-08-02 LAB — D-DIMER, QUANTITATIVE: D-Dimer, Quant: 0.57 ug/mL-FEU — ABNORMAL HIGH (ref 0.00–0.50)

## 2021-08-02 LAB — PROCALCITONIN: Procalcitonin: <0.1 ng/mL

## 2021-08-02 NOTE — Progress Notes (Signed)
PROGRESS NOTE   Dustin [name]  [id number] DOB: February 18, 1934 DOA: 07/30/2021 PCP: [name], MD  Brief Narrative:  47 male with history of COPD, OSA, chronic pain with opiate dependence, CAD/CABG, dementia, BPH, hypothyroidism, brought to ED 8/3 decreased urine output slight confusion per grandson LBP T-max 99.6 with negative home COVID test-was sent home Reevaluated 8/5 [facility], for generalized weakness, dysuria and confusion, now positive for COVID was noted to be mildly hypoxic, required 2 L O2 on admission, was started on remdesivir and steroids. -Admission x-ray noted possible right lung infiltrate, was started on antibiotics as well, repeat chest x-ray without pneumonia -His pain medication dose was decreased as well  Hospital-Problem based course  SARS COVID-19 infection -With hypoxia and ?  Pneumonia on admission -Was started on empiric remdesivir and Decadron -Clinically remained stable, weaned off O2 now -Continue remdesivir for 1 more day -PT OT eval -Discharge planning  Confusion, metabolic encephalopathy -In the setting of above infection, polypharmacy and underlying dementia -Improving -PT OT eval  Right lower lobe infiltrate -Likely atelectasis,, or not seen on repeat imaging -Continue incentive spirometry, discontinue doxycycline, afebrile, procalcitonin less than 0.1  COPD, former smoker OSA -Stable, continue Breo, albuterol PRN -Antibiotics discontinued, see above  Chronic pain Anxiety/depression -On oxycodone 30 mg every 4 hours as needed at baseline, in addition to BuSpar Lexapro Namenda Seroquel -Oxycodone was discontinued and started on low-dose Norco instead, his pain appears to be reasonably well controlled -Continue Lexapro, BuSpar and Seroquel -Caution with polypharmacy  CAD/CABG Ischemic cardiomyopathy, EF of 45% -Stable -Clinically appears euvolemic, Lasix on hold, continue aspirin, Plavix, Imdur, Ranexa, Crestor  Dementia -Stable,  continue Namenda  BPH -Continue alfuzosin, Avodart  Hypokalemia -Repleted  DVT prophylaxis: Lovenox  Code Status: Full Family Communication: No family at bedside called and updated daughter-in-law Levada Dy, unable to reach spouse disposition:  Status is: Inpatient  Remains inpatient appropriate because:Hemodynamically unstable, Ongoing active pain requiring inpatient pain management, Altered mental status, and Ongoing diagnostic testing needed not appropriate for outpatient work up  Dispo: The patient is from: Home              Anticipated d/c is to: Home              Patient currently is not medically stable to d/c.   Difficult to place patient No     Subjective: -Feels okay, denies any specific complaints this morning, waiting for breakfast, had a mild cough which has improved  Objective: Vitals:   08/01/21 1426 08/01/21 1610 08/01/21 2108 08/02/21 0752  BP: 127/60 (!) 146/54 120/60 (!) 150/63  Pulse: (!) 56 (!) 53 (!) 56 (!) 49  Resp:  '16 20 17  '$ Temp: 98 F (36.7 C) 97.7 F (36.5 C) 97.7 F (36.5 C) 98 F (36.7 C)  TempSrc: Oral Oral Oral   SpO2: 96% 98% 96% 94%  Weight:        Intake/Output Summary (Last 24 hours) at 08/02/2021 1029 Last data filed at 08/02/2021 0101 Gross per 24 hour  Intake 460 ml  Output 400 ml  Net 60 ml   Filed Weights   07/31/21 2143  Weight: 96.4 kg    Examination:  Pleasant elderly male, laying in bed, AAO x2, no distress CVS: S1-S2, regular rate rhythm Lungs: Good air movement, clear anteriorly Abdomen: Soft, nontender, bowel sounds present Extremities: No edema Skin: No rashes on exposed skin   Data Reviewed: personally reviewed   CBC    Component Value Date/Time  WBC 3.8 (L) 08/02/2021 0136   RBC 4.16 (L) 08/02/2021 0136   HGB 12.6 (L) 08/02/2021 0136   HGB 12.7 (L) 02/25/2021 1258   HCT 37.9 (L) 08/02/2021 0136   HCT 38.7 02/25/2021 1258   HCT 43 01/26/2013 0000   PLT 177 08/02/2021 0136   PLT 206 02/25/2021 1258    MCV 91.1 08/02/2021 0136   MCV 93 02/25/2021 1258   MCH 30.3 08/02/2021 0136   MCHC 33.2 08/02/2021 0136   RDW 13.1 08/02/2021 0136   RDW 12.8 02/25/2021 1258   LYMPHSABS 0.7 08/02/2021 0136   LYMPHSABS 2.3 02/25/2021 1258   MONOABS 0.3 08/02/2021 0136   EOSABS 0.0 08/02/2021 0136   EOSABS 0.1 02/25/2021 1258   BASOSABS 0.0 08/02/2021 0136   BASOSABS 0.0 02/25/2021 1258   CMP Latest Ref Rng & Units 08/02/2021 08/01/2021 07/30/2021  Glucose 70 - 99 mg/dL 143(H) 121(H) 140(H)  BUN 8 - 23 mg/dL '17 22 17  '$ Creatinine 0.61 - 1.24 mg/dL 0.97 0.99 1.20  Sodium 135 - 145 mmol/L 138 135 133(L)  Potassium 3.5 - 5.1 mmol/L 3.6 3.5 3.4(L)  Chloride 98 - 111 mmol/L 105 99 98  CO2 22 - 32 mmol/L '26 28 25  '$ Calcium 8.9 - 10.3 mg/dL 8.4(L) 8.3(L) 8.8(L)  Total Protein 6.5 - 8.1 g/dL 5.6(L) 5.8(L) 6.5  Total Bilirubin 0.3 - 1.2 mg/dL 0.7 1.0 1.7(H)  Alkaline Phos 38 - 126 U/L 35(L) 38 46  AST 15 - 41 U/L 28 33 27  ALT 0 - 44 U/L '21 21 16     '$ Radiology Studies: DG Chest Port 1 View  Result Date: 07/31/2021 CLINICAL DATA:  Positive COVID test shortness of breath EXAM: PORTABLE CHEST 1 VIEW COMPARISON:  07/31/2021, 07/29/2021, 06/12/2020 FINDINGS: Post sternotomy changes. Mild cardiomegaly. No consolidation, pleural effusion, or pneumothorax. IMPRESSION: No active disease. Cardiomegaly with scarring or atelectasis at the bases Electronically Signed   By: Donavan Foil M.D.   On: 07/31/2021 16:31   DG Chest Port 1 View  Result Date: 07/31/2021 CLINICAL DATA:  hx of delerium w PNA, now confused EXAM: PORTABLE CHEST 1 VIEW COMPARISON:  07/29/2021 FINDINGS: Post CABG changes. Stable cardiomegaly. Atherosclerotic calcification of the aortic knob. New streaky right basilar airspace opacity. No pleural effusion or pneumothorax. IMPRESSION: New streaky right basilar airspace opacity, atelectasis versus infiltrate. Electronically Signed   By: Davina Poke D.O.   On: 07/31/2021 12:03     Scheduled Meds:   alfuzosin  10 mg Oral Q breakfast   vitamin C  500 mg Oral TID   aspirin EC  81 mg Oral Daily   busPIRone  10 mg Oral BID   clopidogrel  75 mg Oral Daily   dexamethasone (DECADRON) injection  6 mg Intravenous Q24H   doxazosin  1 mg Oral QHS   dutasteride  0.5 mg Oral Daily   enoxaparin (LOVENOX) injection  40 mg Subcutaneous Q24H   escitalopram  20 mg Oral q morning   fluticasone furoate-vilanterol  1 puff Inhalation Daily   And   umeclidinium bromide  1 puff Inhalation Daily   isosorbide mononitrate  60 mg Oral q morning   levothyroxine  50 mcg Oral q morning   melatonin  5 mg Oral QHS   memantine  10 mg Oral BID   naloxegol oxalate  25 mg Oral Daily   pantoprazole  40 mg Oral q morning   potassium chloride SA  20 mEq Oral Daily   ranolazine  500 mg Oral  BID   rosuvastatin  40 mg Oral q1800   sodium chloride flush  3 mL Intravenous Q12H   zinc sulfate  220 mg Oral Daily   Continuous Infusions:  sodium chloride     remdesivir 100 mg in NS 100 mL 100 mg (08/02/21 1002)     LOS: 2 days   Time spent: 56mn  PDomenic Polite MD [name]  08/02/2021, 10:29 AM

## 2021-08-03 LAB — CBC WITH DIFFERENTIAL/PLATELET
Abs Immature Granulocytes: 0.01 10*3/uL (ref 0.00–0.07)
Basophils Absolute: 0 10*3/uL (ref 0.0–0.1)
Basophils Relative: 0 %
Eosinophils Absolute: 0 10*3/uL (ref 0.0–0.5)
Eosinophils Relative: 0 %
HCT: 37 % — ABNORMAL LOW (ref 39.0–52.0)
Hemoglobin: 12 g/dL — ABNORMAL LOW (ref 13.0–17.0)
Immature Granulocytes: 0 %
Lymphocytes Relative: 24 %
Lymphs Abs: 1 10*3/uL (ref 0.7–4.0)
MCH: 29.8 pg (ref 26.0–34.0)
MCHC: 32.4 g/dL (ref 30.0–36.0)
MCV: 91.8 fL (ref 80.0–100.0)
Monocytes Absolute: 0.3 10*3/uL (ref 0.1–1.0)
Monocytes Relative: 7 %
Neutro Abs: 2.9 10*3/uL (ref 1.7–7.7)
Neutrophils Relative %: 69 %
Platelets: 182 10*3/uL (ref 150–400)
RBC: 4.03 MIL/uL — ABNORMAL LOW (ref 4.22–5.81)
RDW: 13.2 % (ref 11.5–15.5)
WBC: 4.3 10*3/uL (ref 4.0–10.5)
nRBC: 0 % (ref 0.0–0.2)

## 2021-08-03 LAB — COMPREHENSIVE METABOLIC PANEL
ALT: 18 U/L (ref 0–44)
AST: 20 U/L (ref 15–41)
Albumin: 2.9 g/dL — ABNORMAL LOW (ref 3.5–5.0)
Alkaline Phosphatase: 31 U/L — ABNORMAL LOW (ref 38–126)
Anion gap: 6 (ref 5–15)
BUN: 16 mg/dL (ref 8–23)
CO2: 26 mmol/L (ref 22–32)
Calcium: 8.4 mg/dL — ABNORMAL LOW (ref 8.9–10.3)
Chloride: 105 mmol/L (ref 98–111)
Creatinine, Ser: 0.95 mg/dL (ref 0.61–1.24)
GFR, Estimated: >60 mL/min (ref >60)
Glucose, Bld: 134 mg/dL — ABNORMAL HIGH (ref 70–99)
Potassium: 4 mmol/L (ref 3.5–5.1)
Sodium: 137 mmol/L (ref 135–145)
Total Bilirubin: 0.5 mg/dL (ref 0.3–1.2)
Total Protein: 5.5 g/dL — ABNORMAL LOW (ref 6.5–8.1)

## 2021-08-03 LAB — C-REACTIVE PROTEIN: CRP: 1.6 mg/dL — ABNORMAL HIGH (ref <1.0)

## 2021-08-03 MED ORDER — HYDROCODONE-ACETAMINOPHEN 5-325 MG PO TABS: 1.0000 | ORAL_TABLET | Freq: Four times a day (QID) | ORAL | 0 refills | Status: AC | PRN

## 2021-08-03 NOTE — Progress Notes (Signed)
Occupational Therapy Evaluation Patient Details Name: Dustin [name] MRN: [id number] DOB: August 12, 1934 Today's Date: 08/03/2021    History of Present Illness 30 male brought to ED 8/3 with decreased urine output and slight confusion per grandson and was sent home. Pt was re-evaluated 8/5 at [facility] for generalized weakness, dysuria and confusion, now positive for COVID and was noted to be mildly hypoxic, required 2 L O2 on admission; x-ray noted possible right lung infiltrate. Medical history includees COPD, OSA, chronic pain with opiate dependence, CAD/CABG, dementia, BPH, hypothyroidism   Clinical Impression   [name] was evaluated s/p the above impairments. PTA pt was indep in ADLs, sometimes walks with a cane and uses motorized scooters when grocery shopping. He lives in an entry level, 1 level home with his wife and has great family support. Upon evaluation pt stated "I feel even better than I did before." He was supervision/min guard for all ADLs and functional ambulation without AD. No apparent LOB throughout session, VSS on RA. Pt does not need further acute OT. Recommend d/c to home with supervision for all Adls and ambulation initially.     Follow Up Recommendations  No OT follow up;Supervision - Intermittent    Equipment Recommendations  None recommended by OT       Precautions / Restrictions Precautions Precautions: Fall Precaution Comments: COVID+ contact precautions Restrictions Weight Bearing Restrictions: No      Mobility Bed Mobility Overal bed mobility: Modified Independent             General bed mobility comments: Pt sitting EOB eating breakfast upon arrival    Transfers Overall transfer level: Needs assistance Equipment used: None Transfers: Sit to/from Stand Sit to Stand: Supervision         General transfer comment: no apparent LOB, supervision for safety    Balance Overall balance assessment: Needs assistance Sitting-balance support: Feet  supported Sitting balance-Leahy Scale: Good     Standing balance support: No upper extremity supported Standing balance-Leahy Scale: Fair Standing balance comment: pt reports 0 falls in the last 6 months                           ADL either performed or assessed with clinical judgement   ADL Overall ADL's : Needs assistance/impaired Eating/Feeding: Independent;Sitting   Grooming: Wash/dry hands;Wash/dry face;Oral care;Applying deodorant;Supervision/safety;Standing   Upper Body Bathing: Set up;Sitting   Lower Body Bathing: Sit to/from stand;Min guard   Upper Body Dressing : Set up;Sitting   Lower Body Dressing: Min guard;Sit to/from stand   Toilet Transfer: Min guard;Ambulation   Toileting- Clothing Manipulation and Hygiene: Supervision/safety;Sit to/from stand       Functional mobility during ADLs: Min guard General ADL Comments: min guard/supervision level for all ADL for safety. No apparent LOB     Vision Patient Visual Report: No change from baseline Vision Assessment?: No apparent visual deficits            Pertinent Vitals/Pain Pain Assessment: No/denies pain     Hand Dominance Right   Extremity/Trunk Assessment Upper Extremity Assessment Upper Extremity Assessment: Overall WFL for tasks assessed   Lower Extremity Assessment Lower Extremity Assessment: Defer to PT evaluation   Cervical / Trunk Assessment Cervical / Trunk Assessment: Normal   Communication Communication Communication: No difficulties   Cognition Arousal/Alertness: Awake/alert Behavior During Therapy: WFL for tasks assessed/performed Overall Cognitive Status: Within Functional Limits for tasks assessed  General Comments: Pt stated "I feel even better than I did before"   General Comments  VSS on RA, SpO2 above 94% entire session. Per report pt only wears O2 at home when he feels like he needs it            Home Living  Family/patient expects to be discharged to:: Private residence Living Arrangements: Spouse/significant other Available Help at Discharge: Family Type of Home: House Home Access: Level entry     [place]: One level     Bathroom Shower/Tub: Teacher, early years/pre: Handicapped height Bathroom Accessibility: Yes   Home Equipment: Environmental consultant - 2 wheels;Cane - single point   Additional Comments: pt has 2 granddaughters who are RNs, with good family support      Prior Functioning/Environment Level of Independence: Independent with assistive device(s)        Comments: Pt states he uses a cane when he feels like he needs it, and he uses a scooter cart at [employer]. Drives.        OT Problem List: Decreased activity tolerance;Decreased safety awareness;Pain      OT Treatment/Interventions:      OT Goals(Current goals can be found in the care plan section) Acute Rehab OT Goals Patient Stated Goal: be able to d/c in time to go to PCP at 2 today OT Goal Formulation: With patient   AM-PAC OT "6 Clicks" Daily Activity     Outcome Measure Help from another person eating meals?: None Help from another person taking care of personal grooming?: A Little Help from another person toileting, which includes using toliet, bedpan, or urinal?: A Little Help from another person bathing (including washing, rinsing, drying)?: A Little Help from another person to put on and taking off regular upper body clothing?: None Help from another person to put on and taking off regular lower body clothing?: A Lot 6 Click Score: 19   End of Session    Activity Tolerance: Patient tolerated treatment well Patient left: in chair;with call bell/phone within reach (eating breakfast)  OT Visit Diagnosis: Muscle weakness (generalized) (M62.81);Pain                Time: [id number] OT Time Calculation (min): 21 min Charges:  OT General Charges $OT Visit: 1 Visit OT Evaluation $OT Eval  Moderate Complexity: 1 Mod    Lillionna Nabi A Koryn Charlot 08/03/2021, 8:42 AM

## 2021-08-03 NOTE — Progress Notes (Signed)
Pt has been discharged. Discharge done by [facility] with SWOT team. See nurse's note for details.

## 2021-08-03 NOTE — Evaluation (Signed)
Physical Therapy Evaluation Patient Details Name: Dustin [name] MRN: [id number] DOB: November 29, 1934 Today's Date: 08/03/2021   History of Present Illness  30 male brought to ED 8/3 with decreased urine output and slight confusion per grandson and was sent home. Pt was re-evaluated 8/5 at [facility] for generalized weakness, dysuria and confusion, now positive for COVID and was noted to be mildly hypoxic, required 2 L O2 on admission; x-ray noted possible right lung infiltrate. Medical history includees COPD, OSA, chronic pain with opiate dependence, CAD/CABG, dementia, BPH, hypothyroidism   Clinical Impression  Pt in bed upon arrival of PT, agreeable to evaluation at this time. Prior to admission the pt was completely independent with all mobility and ADLs. The pt now presents with minor limitations in functional mobility, endurance, and dynamic stability due to above dx, but is safe to return home with family support when medically cleared. The pt was able to complete sit-stand transfers without UE support, and no need for assist, as well as complete sustained ambulation around his room with single UE support and VSS on RA. The pt did have one LOB, but was able to self-correct without assist. All education was completed regarding safety with mobility at home and recommended use of pt's cane. No further acute PT needs identified at this time, thank you for the consult.      Follow Up Recommendations No PT follow up;Supervision for mobility/OOB    Equipment Recommendations  None recommended by PT    Recommendations for Other Services       Precautions / Restrictions Precautions Precautions: Fall Precaution Comments: COVID+ contact precautions Restrictions Weight Bearing Restrictions: No      Mobility  Bed Mobility Overal bed mobility: Modified Independent             General bed mobility comments: pt able to complete with some use of bed rails, but no physical assist to come to sitting  EOB    Transfers Overall transfer level: Needs assistance Equipment used: None Transfers: Sit to/from Stand Sit to Stand: Supervision         General transfer comment: supervision for safety during session, pt able to complete wihtout use of UE or AD  Ambulation/Gait Ambulation/Gait assistance: Supervision Gait Distance (Feet): 60 Feet Assistive device: IV Pole Gait Pattern/deviations: Step-through pattern;Decreased stride length Gait velocity: decreased Gait velocity interpretation: <1.31 ft/sec, indicative of household ambulator General Gait Details: pt with slow but steady gait in room with support of IV pole to steady. pt with single LOB but able to recover without physical assist. SpO2 steady     Balance Overall balance assessment: Needs assistance Sitting-balance support: Feet supported Sitting balance-Leahy Scale: Good     Standing balance support: Single extremity supported Standing balance-Leahy Scale: Fair Standing balance comment: pt with one minor LOB (able to self-correct without assist) that occured when leaning to look out window while continuing to walk/turn                             Pertinent Vitals/Pain Pain Assessment: No/denies pain    Home Living Family/patient expects to be discharged to:: Private residence Living Arrangements: Spouse/significant other Available Help at Discharge: Family Type of Home: House Home Access: Level entry     Home Layout: One level Home Equipment: Environmental consultant - 2 wheels;Cane - single point Additional Comments: pt has 2 granddaughters who are RNs, with good family support    Prior Function Level of Independence: Independent  with assistive device(s)         Comments: Pt states he uses a cane when he feels like he needs it, and he uses a scooter cart at [employer]. Drives.     Hand Dominance   Dominant Hand: Right    Extremity/Trunk Assessment   Upper Extremity Assessment Upper Extremity  Assessment: Overall WFL for tasks assessed    Lower Extremity Assessment Lower Extremity Assessment: Overall WFL for tasks assessed    Cervical / Trunk Assessment Cervical / Trunk Assessment: Normal  Communication   Communication: No difficulties  Cognition Arousal/Alertness: Awake/alert Behavior During Therapy: WFL for tasks assessed/performed Overall Cognitive Status: Within Functional Limits for tasks assessed                                 General Comments: Pt alert, pleasant, and able to answer all questions with good accuracy. reports slight HOH      General Comments General comments (skin integrity, edema, etc.): VSS on RA through session, even with prolonged ambulation    Exercises     Assessment/Plan    PT Assessment Patent does not need any further PT services  PT Problem List         PT Treatment Interventions      PT Goals (Current goals can be found in the Care Plan section)  Acute Rehab PT Goals Patient Stated Goal: be able to d/c in time to go to PCP at 2 today PT Goal Formulation: With patient Time For Goal Achievement: 08/17/21 Potential to Achieve Goals: Good     AM-PAC PT "6 Clicks" Mobility  Outcome Measure Help needed turning from your back to your side while in a flat bed without using bedrails?: None Help needed moving from lying on your back to sitting on the side of a flat bed without using bedrails?: None Help needed moving to and from a bed to a chair (including a wheelchair)?: None Help needed standing up from a chair using your arms (e.g., wheelchair or bedside chair)?: None Help needed to walk in hospital room?: A Little Help needed climbing 3-5 steps with a railing? : A Little 6 Click Score: 22    End of Session   Activity Tolerance: Patient tolerated treatment well Patient left: in bed;with call bell/phone within reach Nurse Communication: Mobility status PT Visit Diagnosis: Other abnormalities of gait and  mobility (R26.89)    Time: [id number] PT Time Calculation (min) (ACUTE ONLY): 18 min   Charges:   PT Evaluation $PT Eval Low Complexity: 1 Low          Inocencio Homes, PT, DPT   Acute Rehabilitation Department Pager #: [phone]  Otho Bellows 08/03/2021, 11:50 AM

## 2021-08-03 NOTE — Plan of Care (Signed)
  Problem: Education: Goal: Knowledge of risk factors and measures for prevention of condition will improve Outcome: Adequate for Discharge   

## 2021-08-03 NOTE — Progress Notes (Signed)
Discharge instructions (including medications) discussed with and copy provided to patient/caregiver 

## 2021-08-04 LAB — CULTURE, BLOOD (SINGLE)
Culture: NO GROWTH
Special Requests: ADEQUATE

## 2021-08-06 DIAGNOSIS — G894 Chronic pain syndrome: Secondary | ICD-10-CM | POA: Diagnosis not present

## 2021-08-06 DIAGNOSIS — Z6829 Body mass index (BMI) 29.0-29.9, adult: Secondary | ICD-10-CM | POA: Diagnosis not present

## 2021-08-06 DIAGNOSIS — M1991 Primary osteoarthritis, unspecified site: Secondary | ICD-10-CM | POA: Diagnosis not present

## 2021-08-06 DIAGNOSIS — E663 Overweight: Secondary | ICD-10-CM | POA: Diagnosis not present

## 2021-08-06 DIAGNOSIS — J449 Chronic obstructive pulmonary disease, unspecified: Secondary | ICD-10-CM | POA: Diagnosis not present

## 2021-08-06 DIAGNOSIS — U071 COVID-19: Secondary | ICD-10-CM | POA: Diagnosis not present

## 2021-08-06 DIAGNOSIS — J189 Pneumonia, unspecified organism: Secondary | ICD-10-CM | POA: Diagnosis not present

## 2021-08-12 DIAGNOSIS — N179 Acute kidney failure, unspecified: Secondary | ICD-10-CM | POA: Diagnosis not present

## 2021-08-12 DIAGNOSIS — G9341 Metabolic encephalopathy: Secondary | ICD-10-CM | POA: Diagnosis not present

## 2021-08-12 DIAGNOSIS — J1282 Pneumonia due to coronavirus disease 2019: Secondary | ICD-10-CM | POA: Diagnosis not present

## 2021-08-12 DIAGNOSIS — U071 COVID-19: Secondary | ICD-10-CM | POA: Diagnosis not present

## 2021-08-12 NOTE — Discharge Summary (Signed)
Physician Discharge Summary  Dustin [name] [id number] DOB: Nov 22, 1934 DOA: 07/30/2021  PCP: Dustin School, MD  Admit date: 07/30/2021 Discharge date: 08/03/2021  Time spent: 26mnutes  Recommendations for Outpatient Follow-up:  PCP Dr. FRiley [name] 1 week, FYI patient's pain medicine dose decreased significantly   Discharge Diagnoses:  Principal Problem:   Acute metabolic encephalopathy Active Problems:   Hyperlipidemia   OSA (obstructive sleep apnea)   Alzheimer disease (HCC)   HYPERTENSION, BENIGN   Coronary artery disease   Hypothyroidism   Hypokalemia   Chronic diastolic CHF (congestive heart failure) (HCC)   COPD (chronic obstructive pulmonary disease) ([facility]   COVID-19 virus infection   Right lower lobe pneumonia   Respiratory failure, acute ([facility] Polypharmacy  Discharge Condition: Stable  Diet recommendation: Heart healthy  Filed Weights   07/31/21 2143 08/03/21 0659  Weight: 96.4 kg 96.8 kg    History of present illness:  85male with history of COPD, OSA, chronic pain with opiate dependence, CAD/CABG, dementia, BPH, hypothyroidism, brought to ED 8/3 decreased urine output slight confusion per grandson LBP T-max 99.6 with negative home COVID test-was sent home [facility], dysuria and confusion, now positive for COVID was noted to be mildly hypoxic, required 2 L O2 on admission, was started on remdesivir and steroids. -Admission x-ray noted possible right lung infiltrate, was started on antibiotics as well, repeat chest x-ray without pneumonia  Hospital Course:   SARS COVID-19 infection -With hypoxia and ?  Pneumonia on admission -Repeat x-ray did not show evidence of pneumonia, treated with 3 days of IV remdesivir and steroids with good clinical response, weaned off oxygen -Discharged home in a stable condition, advised to complete airborne isolation at home   Confusion, metabolic encephalopathy -In the setting of  above infection, polypharmacy and underlying dementia -Improved, narcotic dose decreased significantly was on 30 mg of oxycodone 4 times daily as needed, during this hospitalization has been stable on Vicodin 5 mg every 6 as needed, which she has required once or twice a day only   COPD, former smoker OSA -Stable, continue Breo, albuterol PRN -Antibiotics discontinued, see above   Chronic pain Anxiety/depression -On oxycodone 30 mg every 4 hours as needed at baseline, in addition to BuSpar Lexapro Namenda Seroquel -Oxycodone was discontinued and started on low-dose Norco instead, in the setting of encephalopathy  -his pain appears to be reasonably well controlled on much lower doses of Norco while he was hospitalized for 3 days, recommend using low-dose Norco i.e. 5 mg instead of 30 of oxycodone at discharge as well -Continue Lexapro, BuSpar and Seroquel -Caution with polypharmacy   CAD/CABG Ischemic cardiomyopathy, EF of 45% -Stable -Clinically appears euvolemic, Lasix resumed, continue aspirin, Plavix, Imdur, Ranexa, Crestor   Dementia -Stable, continue Namenda   BPH -Continue alfuzosin, Avodart   Hypokalemia -Repleted    Discharge Exam: Vitals:   08/02/21 1945 08/03/21 0810  BP: 126/77 (!) 133/91  Pulse: (!) 58   Resp: 18 18  Temp: (!) 97.5 Dustin (36.4 C) 97.8 Dustin (36.6 C)  SpO2: 94% 95%    General: AAO x2, no distress Cardiovascular: S1-S2, regular rate rhythm Respiratory: Clear  Discharge Instructions   Discharge Instructions     Diet - low sodium heart healthy   Complete by: As directed    Increase activity slowly   Complete by: As directed       Allergies as of 08/03/2021   No Known Allergies      Medication List  STOP taking these medications    oxycodone 30 MG immediate release tablet Commonly known as: ROXICODONE       TAKE these medications    albuterol (2.5 MG/3ML) 0.083% nebulizer solution Commonly known as: PROVENTIL Take 3 mLs  (2.5 mg total) by nebulization every 6 (six) hours as needed for wheezing or shortness of breath. Patient will also need the nebulizer machine with this prescription.   albuterol 108 (90 Base) MCG/ACT inhaler Commonly known as: VENTOLIN HFA Inhale 2 puffs into the lungs every 6 (six) hours as needed for wheezing or shortness of breath.   alfuzosin 10 MG 24 hr tablet Commonly known as: UROXATRAL Take 1 tablet (10 mg total) by mouth daily with breakfast.   aspirin EC 81 MG tablet Take 81 mg by mouth daily.   Breztri Aerosphere 160-9-4.8 MCG/ACT Aero Generic drug: Budeson-Glycopyrrol-Formoterol Inhale 2 puffs into the lungs in the morning and at bedtime.   budesonide-formoterol 160-4.5 MCG/ACT inhaler Commonly known as: SYMBICORT Inhale 2 puffs into the lungs 2 (two) times daily.   busPIRone 10 MG tablet Commonly known as: BUSPAR Take 10 mg by mouth 2 (two) times daily.   clopidogrel 75 MG tablet Commonly known as: PLAVIX TAKE 1 TABLET BY MOUTH DAILY.   doxazosin 1 MG tablet Commonly known as: CARDURA Take 1 mg by mouth at bedtime.   dutasteride 0.5 MG capsule Commonly known as: AVODART Take 1 capsule (0.5 mg total) by mouth daily. In the afternoon   escitalopram 20 MG tablet Commonly known as: LEXAPRO Take 20 mg by mouth every morning.   furosemide 40 MG tablet Commonly known as: LASIX Take 1 tablet (40 mg total) by mouth in the morning. What changed: when to take this   hydrOXYzine 25 MG tablet Commonly known as: ATARAX/VISTARIL Take 25 mg by mouth 2 (two) times daily as needed for anxiety.   isosorbide mononitrate 60 MG 24 hr tablet Commonly known as: IMDUR Take 120 mg by mouth daily.   levothyroxine 50 MCG tablet Commonly known as: SYNTHROID Take 50 mcg by mouth every morning.   meclizine 25 MG tablet Commonly known as: ANTIVERT Take 25 mg by mouth 4 (four) times daily as needed for dizziness.   memantine 10 MG tablet Commonly known as: NAMENDA Take 10  mg by mouth 2 (two) times daily.   Movantik 25 MG Tabs tablet Generic drug: naloxegol oxalate Take 25 mg by mouth daily.   mupirocin ointment 2 % Commonly known as: BACTROBAN Apply 1 application topically 3 (three) times daily as needed (affected areas on legs).   nitroGLYCERIN 0.4 MG SL tablet Commonly known as: NITROSTAT Place 1 tablet (0.4 mg total) under the tongue every 5 (five) minutes as needed for chest pain. Pt has bought nitro bottle from home and oxycodone 30 mg ( 3 or 4 tabs) is in nitro bottle per wife Scottie Seraphin. What changed: additional instructions   pantoprazole 40 MG tablet Commonly known as: PROTONIX Take 1 tablet (40 mg total) by mouth every morning.   potassium chloride SA 20 MEQ tablet Commonly known as: KLOR-CON Take 20 mEq by mouth daily.   QUEtiapine 25 MG tablet Commonly known as: SEROQUEL Take 25 mg by mouth at bedtime as needed (sleep).   ranolazine 500 MG 12 hr tablet Commonly known as: Ranexa Take 1 tablet (500 mg total) by mouth 2 (two) times daily.   rosuvastatin 40 MG tablet Commonly known as: CRESTOR Take 1 tablet (40 mg total) by mouth daily at 6  PM.   tamsulosin 0.4 MG Caps capsule Commonly known as: FLOMAX Take 0.4 mg by mouth daily.   triamcinolone cream 0.1 % Commonly known as: KENALOG Apply 1 application topically 2 (two) times daily as needed (To affected areas on legs).       ASK your doctor about these medications    HYDROcodone-acetaminophen 5-325 MG tablet Commonly known as: NORCO/VICODIN Take 1 tablet by mouth every 6 (six) hours as needed for up to 7 days for moderate pain or severe pain. Ask about: Should I take this medication?       No Known Allergies  Follow-up Information     Dustin School, MD. Schedule an appointment as soon as possible for a visit in 1 week(s).   Specialty: Internal Medicine Contact information: [address] [place] [id number] [phone]                   The results of significant diagnostics from this hospitalization (including imaging, microbiology, ancillary and laboratory) are listed below for reference.    Significant Diagnostic Studies: CT HEAD WO CONTRAST (5MM)  Result Date: 07/30/2021 CLINICAL DATA:  Cerebral hemorrhage suspected. Progressive confusion. Low grade fever EXAM: CT HEAD WITHOUT CONTRAST TECHNIQUE: Contiguous axial images were obtained from the base of the skull through the vertex without intravenous contrast. COMPARISON:  None. FINDINGS: Brain: Normal anatomic configuration. Parenchymal volume loss is commensurate with the patient's age. Mild periventricular white matter changes are present likely reflecting the sequela of small vessel ischemia. Tiny remote lacunar infarct within the right putamen. No abnormal intra or extra-axial mass lesion or fluid collection. No abnormal mass effect or midline shift. No evidence of acute intracranial hemorrhage or infarct. Ventricular size is normal. Cerebellum unremarkable. Vascular: No asymmetric hyperdense vasculature at the skull base. Skull: Intact Sinuses/Orbits: Paranasal sinuses are clear. Orbits are unremarkable. Other: Mastoid air cells and middle ear cavities are clear. IMPRESSION: No acute intracranial abnormality.  Mild senescent change. Electronically Signed   By: Fidela Salisbury MD   On: 07/30/2021 00:18   CT ABDOMEN PELVIS W CONTRAST  Result Date: 07/30/2021 CLINICAL DATA:  Abdominal abscess/infection suspected. Low back pain, urinary retention, low-grade fever EXAM: CT ABDOMEN AND PELVIS WITH CONTRAST TECHNIQUE: Multidetector CT imaging of the abdomen and pelvis was performed using the standard protocol following bolus administration of intravenous contrast. CONTRAST:  168m OMNIPAQUE IOHEXOL 300 MG/ML  SOLN COMPARISON:  None. FINDINGS: Lower chest: Visualized lung bases are clear. Coronary artery bypass grafting has been performed. Cardiac size within normal limits.  Hepatobiliary: No focal liver abnormality is seen. No gallstones, gallbladder wall thickening, or biliary dilatation. Pancreas: Unremarkable Spleen: Unremarkable Adrenals/Urinary Tract: The adrenal glands are unremarkable. The kidneys are normal in size and position. Multiple simple cortical cysts are seen within the right kidney. The kidneys are otherwise unremarkable. Foley catheter balloon seen within a decompressed bladder lumen. Stomach/Bowel: Extensive sigmoid diverticulosis. Moderate diverticulosis noted within the descending colon. The stomach, small bowel, and large bowel are otherwise unremarkable. No evidence of obstruction or focal inflammation. The appendix is unremarkable. No free intraperitoneal gas or fluid. Vascular/Lymphatic: Extensive aortoiliac atherosclerotic calcification. No aortic aneurysm. No pathologic adenopathy within the abdomen and pelvis. Reproductive: Prostate is unremarkable. Other: No abdominal wall hernia. Musculoskeletal: No acute bone abnormality. No lytic or blastic bone lesions are identified. IMPRESSION: No acute intra-abdominal pathology. No definite radiographic explanation for the patient's reported symptoms. Moderate to severe distal colonic diverticulosis without superimposed inflammatory change. Aortic Atherosclerosis (ICD10-I70.0). Electronically Signed  By: Fidela Salisbury MD   On: 07/30/2021 00:29   DG Chest Port 1 View  Result Date: 07/31/2021 CLINICAL DATA:  Positive COVID test shortness of breath EXAM: PORTABLE CHEST 1 VIEW COMPARISON:  07/31/2021, 07/29/2021, 06/12/2020 FINDINGS: Post sternotomy changes. Mild cardiomegaly. No consolidation, pleural effusion, or pneumothorax. IMPRESSION: No active disease. Cardiomegaly with scarring or atelectasis at the bases Electronically Signed   By: Donavan Foil M.D.   On: 07/31/2021 16:31   DG Chest Port 1 View  Result Date: 07/31/2021 CLINICAL DATA:  hx of delerium w PNA, now confused EXAM: PORTABLE CHEST 1 VIEW  COMPARISON:  07/29/2021 FINDINGS: Post CABG changes. Stable cardiomegaly. Atherosclerotic calcification of the aortic knob. New streaky right basilar airspace opacity. No pleural effusion or pneumothorax. IMPRESSION: New streaky right basilar airspace opacity, atelectasis versus infiltrate. Electronically Signed   By: Davina Poke D.O.   On: 07/31/2021 12:03   DG Chest Port 1 View  Result Date: 07/29/2021 CLINICAL DATA:  Pain unable to urinate EXAM: PORTABLE CHEST 1 VIEW COMPARISON:  06/01/2021 FINDINGS: Post sternotomy changes. Mild cardiomegaly. No focal opacity, pleural effusion, or pneumothorax. Aortic atherosclerosis. IMPRESSION: No active disease.  Mild cardiomegaly Electronically Signed   By: Donavan Foil M.D.   On: 07/29/2021 23:23    Microbiology: No results found for this or any previous visit (from the past 240 hour(s)).   Labs: Basic Metabolic Panel: No results for input(s): NA, K, CL, CO2, GLUCOSE, BUN, CREATININE, CALCIUM, MG, PHOS in the last 168 hours. Liver Function Tests: No results for input(s): AST, ALT, ALKPHOS, BILITOT, PROT, ALBUMIN in the last 168 hours. No results for input(s): LIPASE, AMYLASE in the last 168 hours. No results for input(s): AMMONIA in the last 168 hours. CBC: No results for input(s): WBC, NEUTROABS, HGB, HCT, MCV, PLT in the last 168 hours. Cardiac Enzymes: No results for input(s): CKTOTAL, CKMB, CKMBINDEX, TROPONINI in the last 168 hours. BNP: BNP (last 3 results) Recent Labs    09/21/20 1932 02/10/21 1017  BNP 71.0 153.0*    ProBNP (last 3 results) No results for input(s): PROBNP in the last 8760 hours.  CBG: No results for input(s): GLUCAP in the last 168 hours.     Signed:  Domenic Polite MD.  [name] 08/12/2021, 3:32 PM

## 2021-08-13 DIAGNOSIS — G9341 Metabolic encephalopathy: Secondary | ICD-10-CM | POA: Diagnosis not present

## 2021-08-13 DIAGNOSIS — N179 Acute kidney failure, unspecified: Secondary | ICD-10-CM | POA: Diagnosis not present

## 2021-08-13 DIAGNOSIS — J1282 Pneumonia due to coronavirus disease 2019: Secondary | ICD-10-CM | POA: Diagnosis not present

## 2021-08-13 DIAGNOSIS — U071 COVID-19: Secondary | ICD-10-CM | POA: Diagnosis not present

## 2021-08-26 ENCOUNTER — Encounter (INDEPENDENT_AMBULATORY_CARE_PROVIDER_SITE_OTHER): Payer: Medicare HMO | Admitting: Ophthalmology

## 2021-09-02 DIAGNOSIS — G894 Chronic pain syndrome: Secondary | ICD-10-CM | POA: Diagnosis not present

## 2021-09-02 DIAGNOSIS — M1991 Primary osteoarthritis, unspecified site: Secondary | ICD-10-CM | POA: Diagnosis not present

## 2021-09-02 DIAGNOSIS — J449 Chronic obstructive pulmonary disease, unspecified: Secondary | ICD-10-CM | POA: Diagnosis not present

## 2021-09-02 DIAGNOSIS — E663 Overweight: Secondary | ICD-10-CM | POA: Diagnosis not present

## 2021-09-02 DIAGNOSIS — Z6829 Body mass index (BMI) 29.0-29.9, adult: Secondary | ICD-10-CM | POA: Diagnosis not present

## 2021-09-04 ENCOUNTER — Encounter (INDEPENDENT_AMBULATORY_CARE_PROVIDER_SITE_OTHER): Payer: Medicare HMO | Admitting: Ophthalmology

## 2021-09-04 DIAGNOSIS — H3581 Retinal edema: Secondary | ICD-10-CM

## 2021-09-04 DIAGNOSIS — H35033 Hypertensive retinopathy, bilateral: Secondary | ICD-10-CM

## 2021-09-04 DIAGNOSIS — Z961 Presence of intraocular lens: Secondary | ICD-10-CM

## 2021-09-04 DIAGNOSIS — H35371 Puckering of macula, right eye: Secondary | ICD-10-CM

## 2021-09-04 DIAGNOSIS — H353122 Nonexudative age-related macular degeneration, left eye, intermediate dry stage: Secondary | ICD-10-CM

## 2021-09-04 DIAGNOSIS — H35351 Cystoid macular degeneration, right eye: Secondary | ICD-10-CM

## 2021-09-04 DIAGNOSIS — I10 Essential (primary) hypertension: Secondary | ICD-10-CM

## 2021-09-04 DIAGNOSIS — D3132 Benign neoplasm of left choroid: Secondary | ICD-10-CM

## 2021-09-12 DIAGNOSIS — J1282 Pneumonia due to coronavirus disease 2019: Secondary | ICD-10-CM | POA: Diagnosis not present

## 2021-09-12 DIAGNOSIS — N179 Acute kidney failure, unspecified: Secondary | ICD-10-CM | POA: Diagnosis not present

## 2021-09-12 DIAGNOSIS — G9341 Metabolic encephalopathy: Secondary | ICD-10-CM | POA: Diagnosis not present

## 2021-09-12 DIAGNOSIS — U071 COVID-19: Secondary | ICD-10-CM | POA: Diagnosis not present

## 2021-09-13 DIAGNOSIS — J1282 Pneumonia due to coronavirus disease 2019: Secondary | ICD-10-CM | POA: Diagnosis not present

## 2021-09-13 DIAGNOSIS — G9341 Metabolic encephalopathy: Secondary | ICD-10-CM | POA: Diagnosis not present

## 2021-09-13 DIAGNOSIS — U071 COVID-19: Secondary | ICD-10-CM | POA: Diagnosis not present

## 2021-09-13 DIAGNOSIS — N179 Acute kidney failure, unspecified: Secondary | ICD-10-CM | POA: Diagnosis not present

## 2021-09-17 NOTE — Progress Notes (Signed)
[name] Note  09/21/2021     CHIEF COMPLAINT Patient presents for Retina Follow Up   HISTORY OF PRESENT ILLNESS: Dustin [name] is a 87 y.o. male who presents to the clinic today for:   HPI     Retina Follow Up   Patient presents with  Other.  In right eye.  This started 4 weeks ago.  I, the attending physician,  performed the HPI with the patient and updated documentation appropriately.        Comments   Patient here for 4 weeks retina follow up for CME OD. Patient states vision can't see. Blurry. No eye pain.      Last edited by Bernarda Caffey, MD on 09/21/2021 11:47 PM.    Pt feels like he cannot see as well as at last visit.  F/u delayed due to illness.  Has difficulty especially seeing at near.  HISTORICAL INFORMATION:   Selected notes from the MEDICAL RECORD NUMBER Pt of Dr. Zigmund Daniel LEE: 06.29.20 BCVA: OD 20/20, OS 20/30 Ocular Hx- CME OD, EMR OD, HTN Ret. OU, Choroidal Nevus OS, NS OS, ARMD OS, Pseudo OD   CURRENT MEDICATIONS: No current outpatient medications on file. (Ophthalmic Drugs)   No current facility-administered medications for this visit. (Ophthalmic Drugs)   Current Outpatient Medications (Other)  Medication Sig   albuterol (PROVENTIL) (2.5 MG/3ML) 0.083% nebulizer solution Take 3 mLs (2.5 mg total) by nebulization every 6 (six) hours as needed for wheezing or shortness of breath. Patient will also need the nebulizer machine with this prescription.   albuterol (VENTOLIN HFA) 108 (90 Base) MCG/ACT inhaler Inhale 2 puffs into the lungs every 6 (six) hours as needed for wheezing or shortness of breath.   alfuzosin (UROXATRAL) 10 MG 24 hr tablet Take 1 tablet (10 mg total) by mouth daily with breakfast.   aspirin EC 81 MG tablet Take 81 mg by mouth daily.   Budeson-Glycopyrrol-Formoterol (BREZTRI AEROSPHERE) 160-9-4.8 MCG/ACT AERO Inhale 2 puffs into the lungs in the morning and at bedtime.   budesonide-formoterol  (SYMBICORT) 160-4.5 MCG/ACT inhaler Inhale 2 puffs into the lungs 2 (two) times daily.   busPIRone (BUSPAR) 10 MG tablet Take 10 mg by mouth 2 (two) times daily.   clopidogrel (PLAVIX) 75 MG tablet TAKE 1 TABLET BY MOUTH DAILY. (Patient taking differently: Take 75 mg by mouth daily.)   doxazosin (CARDURA) 1 MG tablet Take 1 mg by mouth at bedtime.   dutasteride (AVODART) 0.5 MG capsule Take 1 capsule (0.5 mg total) by mouth daily. In the afternoon   escitalopram (LEXAPRO) 20 MG tablet Take 20 mg by mouth every morning.    furosemide (LASIX) 40 MG tablet Take 1 tablet (40 mg total) by mouth in the morning. (Patient taking differently: Take 40 mg by mouth daily.)   hydrOXYzine (ATARAX/VISTARIL) 25 MG tablet Take 25 mg by mouth 2 (two) times daily as needed for anxiety.   isosorbide mononitrate (IMDUR) 60 MG 24 hr tablet Take 120 mg by mouth daily.   levothyroxine (SYNTHROID, LEVOTHROID) 50 MCG tablet Take 50 mcg by mouth every morning.   meclizine (ANTIVERT) 25 MG tablet Take 25 mg by mouth 4 (four) times daily as needed for dizziness.   memantine (NAMENDA) 10 MG tablet Take 10 mg by mouth 2 (two) times daily.    MOVANTIK 25 MG TABS tablet Take 25 mg by mouth daily.   mupirocin ointment (BACTROBAN) 2 % Apply 1 application topically 3 (three) times daily  as needed (affected areas on legs).   nitroGLYCERIN (NITROSTAT) 0.4 MG SL tablet Place 1 tablet (0.4 mg total) under the tongue every 5 (five) minutes as needed for chest pain. Pt has bought nitro bottle from home and oxycodone 30 mg ( 3 or 4 tabs) is in nitro bottle per wife Kelli Egolf. (Patient taking differently: Place 0.4 mg under the tongue every 5 (five) minutes as needed for chest pain.)   pantoprazole (PROTONIX) 40 MG tablet Take 1 tablet (40 mg total) by mouth every morning.   potassium chloride SA (K-DUR,KLOR-CON) 20 MEQ tablet Take 20 mEq by mouth daily.    QUEtiapine (SEROQUEL) 25 MG tablet Take 25 mg by mouth at bedtime as needed  (sleep).   ranolazine (RANEXA) 500 MG 12 hr tablet Take 1 tablet (500 mg total) by mouth 2 (two) times daily.   rosuvastatin (CRESTOR) 40 MG tablet Take 1 tablet (40 mg total) by mouth daily at 6 PM.   tamsulosin (FLOMAX) 0.4 MG CAPS capsule Take 0.4 mg by mouth daily.   triamcinolone cream (KENALOG) 0.1 % Apply 1 application topically 2 (two) times daily as needed (To affected areas on legs).   No current facility-administered medications for this visit. (Other)    REVIEW OF SYSTEMS: ROS   Positive for: Eyes Negative for: Constitutional, Gastrointestinal, Neurological, Skin, Genitourinary, Musculoskeletal, HENT, Endocrine, Cardiovascular, Respiratory, Psychiatric, Allergic/Imm, Heme/Lymph Last edited by Theodore Demark, COA on 09/21/2021  2:12 PM.      ALLERGIES No Known Allergies  PAST MEDICAL HISTORY Past Medical History:  Diagnosis Date   Alzheimer disease ([place])    Anxiety    Arthritis    Atrophic gastritis    a. By EGD 02/2013.   Carotid artery disease (HCC)    a. mild-mod plaque, <50% stenosis bilat by duplex 2018.   Coronary atherosclerosis of native coronary artery    a. Multivessel s/p CABG 1996. b. s/p DES x 2 SVG to PDA 8/12 with distal disease managed medically.   DDD (degenerative disc disease)    Chronic back pain   Dementia (HCC)    Enlarged prostate    Essential hypertension    Hematuria    Hypothyroidism    LBBB (left bundle branch block)    MI (myocardial infarction) (HCC)    Mixed hyperlipidemia    OA (osteoarthritis)    OSA (obstructive sleep apnea)    Pneumonia due to COVID-19 virus    February 2021   Sinus bradycardia    a. Aricept and BB discontinued due to this.   Past Surgical History:  Procedure Laterality Date   COLONOSCOPY  08/03/2004   Jenkins-numerous large diverticula in the descending, transverse, descending, and sigmoid colon. Otherwise normal exam.   COLONOSCOPY  07/12/2012   RMR: External hemorrhoidal tag; multiple rectal and  colonic polyps removed and/or treated as described above. Pancolonic diverticulosis. Bx-tubular adenomas, rectal hyperplastic polyp. next colonoscopy in 06/2015.   COLONOSCOPY N/A 06/22/2016   Procedure: COLONOSCOPY;  Surgeon: Aviva Signs, MD;  [facility]: [facility];  Service: Gastroenterology;  Laterality: N/A;  37   CORONARY ANGIOPLASTY WITH STENT PLACEMENT     CORONARY ARTERY BYPASS GRAFT  1996   LIMA to LAD, SVG to D2, SVG to PDA, SVG to OM1 and OM2   CORONARY STENT INTERVENTION N/A 12/10/2019   Procedure: CORONARY STENT INTERVENTION;  Surgeon: Burnell Blanks, MD;  Location: [facility];  Service: Cardiovascular;  Laterality: N/A;   ESOPHAGOGASTRODUODENOSCOPY N/A 03/16/2013   Procedure: ESOPHAGOGASTRODUODENOSCOPY (  EGD);  Surgeon: Daneil Dolin, MD;  [facility]: [facility];  Service: Endoscopy;  Laterality: N/A;  12:00-moved to [place] notified pt   ESOPHAGOGASTRODUODENOSCOPY N/A 03/06/2013   Procedure: ESOPHAGOGASTRODUODENOSCOPY (EGD);  Surgeon: Daneil Dolin, MD;  [facility]: [facility];  Service: Endoscopy;  Laterality: N/A;   HERNIA REPAIR     INTRAVASCULAR ULTRASOUND/IVUS N/A 12/10/2019   Procedure: Intravascular Ultrasound/IVUS;  Surgeon: Burnell Blanks, MD;  Location: [facility];  Service: Cardiovascular;  Laterality: N/A;   LEFT HEART CATH AND CORS/GRAFTS ANGIOGRAPHY N/A 12/10/2019   Procedure: LEFT HEART CATH AND CORS/GRAFTS ANGIOGRAPHY;  Surgeon: Burnell Blanks, MD;  Location: [facility];  Service: Cardiovascular;  Laterality: N/A;   LEFT HEART CATH AND CORS/GRAFTS ANGIOGRAPHY N/A 03/02/2021   Procedure: LEFT HEART CATH AND CORS/GRAFTS ANGIOGRAPHY;  Surgeon: Nelva Bush, MD;  Location: [facility];  Service: Cardiovascular;  Laterality: N/A;    FAMILY HISTORY Family History  Problem Relation Age of Onset   Heart disease Other    Heart attack Mother    Colon cancer Neg Hx     SOCIAL HISTORY Social History    Tobacco Use   Smoking status: Former    Packs/day: 2.00    Years: 40.00    Pack years: 80.00    Types: Cigarettes    Quit date: 12/27/1994    Years since quitting: 26.7   Smokeless tobacco: Never  Vaping Use   Vaping Use: Never used  Substance Use Topics   Alcohol use: No    Alcohol/week: 0.0 standard drinks   Drug use: No       OPHTHALMIC EXAM:  Base Eye Exam     Visual Acuity (Snellen - Linear)       Right Left   Dist [place] 20/40 -1 20/50 -1   Dist ph [place] 20/30 -2 20/40 +2         Tonometry (Tonopen, 2:09 PM)       Right Left   Pressure 14 12         Pupils       Dark Light Shape React APD   Right 3 2 Round Sluggish None   Left 3 2 Round Sluggish None         Visual Fields (Counting fingers)       Left Right    Full Full         Extraocular Movement       Right Left    Full, Ortho Full, Ortho         Neuro/Psych     Oriented x3: Yes   Mood/Affect: Normal         Dilation     Both eyes: 1.0% Mydriacyl, 2.5% Phenylephrine @ 2:09 PM           Slit Lamp and Fundus Exam     Slit Lamp Exam       Right Left   Lids/Lashes Dermatochalasis - upper lid, Meibomian gland dysfunction Dermatochalasis - upper lid, Meibomian gland dysfunction   Conjunctiva/Sclera White and quiet White and quiet   Cornea arcus, 1-2+ inferior Punctate epithelial erosions, well healed cataract wound, mild tear film debris arcus, 2+ Punctate epithelial erosions, tear film debris   Anterior Chamber Deep and quiet Deep and quiet, narrow temporal angle   Iris Round and dilated Round and moderately dilated to 5.55m   Lens PC IOL in good position, mild PCO 3+ Nuclear sclerosis with brunescence, 3+  Cortical cataract   Vitreous Vitreous syneresis, Posterior vitreous detachment, vitreous condensations Vitreous syneresis, Posterior vitreous detachment         Fundus Exam       Right Left   Disc Mild Pallor, Sharp rim Pink and Sharp   C/D Ratio 0.6 0.5   Macula  Flat, Blunted foveal reflex, +ERM, stable improvement in trace Cystic changes - improved, RPE mottling Flat, Blunted foveal reflex, RPE mottling, clumping and early atrophy, no heme or edema   Vessels Vascular attenuation, mild tortuousity, AV crossing changes Vascular attenuation, mild tortuousity   Periphery Attached, mild reticular degeneration, No heme  Attached, amelonotic choroidal nevus along distal ST arcades, reticular degeneration, No heme             IMAGING AND PROCEDURES  Imaging and Procedures for 09/21/2021  OCT, Retina - OU - Both Eyes       Right Eye Quality was good. Central Foveal Thickness: 313. Progression has been stable. Findings include epiretinal membrane, no SRF, normal foveal contour, macular pucker, no IRF (Stable improvement in IRF; Persistent ERM w/pucker).   Left Eye Quality was good. Central Foveal Thickness: 279. Progression has worsened. Findings include intraretinal hyper-reflective material, abnormal foveal contour, no IRF, no SRF, epiretinal membrane, retinal drusen , outer retinal atrophy (Focal ellipsoid disruption with IRHM IN fovea -- slightly increased).   Notes *Images captured and stored on drive  Diagnosis / Impression:  OD: Stable improvement in IRF; Persistent ERM w/pucker OS: Focal ellipsoid disruption with IRHM IN fovea -- slightly increased  Clinical management:  See below  Abbreviations: NFP - Normal foveal profile. CME - cystoid macular edema. PED - pigment epithelial detachment. IRF - intraretinal fluid. SRF - subretinal fluid. EZ - ellipsoid zone. ERM - epiretinal membrane. ORA - outer retinal atrophy. ORT - outer retinal tubulation. SRHM - subretinal hyper-reflective material. IRHM - intraretinal hyper-reflective material            ASSESSMENT/PLAN:    ICD-10-CM   1. Cystoid macular edema of right eye  H35.351     2. Retinal edema  H35.81 OCT, Retina - OU - Both Eyes    3. Intermediate stage nonexudative age-related  macular degeneration of left eye  H35.3122     4. Nevus of choroid of left eye  D31.32     5. Epiretinal membrane (ERM) of right eye  H35.371     6. Essential hypertension  I10     7. Hypertensive retinopathy of both eyes  H35.033     8. Pseudophakia of right eye  Z96.1     9. Combined forms of age-related cataract of left eye  H25.812      1,2. CME OD  - Zigmund Daniel pt  - lost to f/u w/ [facility] May to Sept 2022 (5 mos)  - hx of RBB kenalog 08.15.18  - was on Prolensa QHS OD for maintenance, but has been out of drops for months  - restarted Prolensa and PF QID OD 08.02.21   - OCT shows stable improvement in IRF/cystic changes -- on PF and Prolensa qdaily OD  - BCVA down to 20/30 from 20/25  - continue Prolensa and PF to QDaily OD  - f/u 4 mos, DFE, OCT  3. Epiretinal membrane, right eye  - mild ERM - BCVA 20/30, down from 20/25 - asymptomatic, no metamorphopsia - no indication for surgery at this time - monitor for now  4. Age related macular degeneration, non-exudative, left eye  - The incidence,  anatomy, and pathology of dry AMD, risk of progression, and the AREDS and AREDS 2 study including smoking risks discussed with patient.  - recommend Amsler grid monitoring  5. Choroidal nevus OS  - amelanotic nevus along distal ST arcades  - no SRF or orange pigment  - no elevation  - monitor  6,7. Hypertensive retinopathy OU - discussed importance of tight BP control - monitor  8. Pseudophakia OD  - s/p CE/IOL OD ([place])  - IOL in good position, doing well  - monitor  9. Mixed Cataract OS - The symptoms of cataract, surgical options, and treatments and risks were discussed with patient. - discussed diagnosis and progression  - will refer to Dr. Marisa Hua at [place] location [facility]) for cat eval   Ophthalmic Meds Ordered this visit:  No orders of the defined types were placed in this encounter.      Return in about 4 months (around 01/21/2022) for 4 mo  f/u for CME OD w/DFE&OCT.  There are no Patient Instructions on file for this visit. This document serves as a record of services personally performed by Gardiner Sleeper, MD, PhD. It was created on their behalf by Leeann Must, [place], an ophthalmic technician. The creation of this record is the provider's dictation and/or activities during the visit.    Electronically signed by: Leeann Must, COA '@TODAY' @ 11:51 PM  This document serves as a record of services personally performed by Gardiner Sleeper, MD, PhD. It was created on their behalf by Estill Bakes, COT an ophthalmic technician. The creation of this record is the provider's dictation and/or activities during the visit.    Electronically signed by: Estill Bakes, COT 9.26.22 @ 11:51 PM   Gardiner Sleeper, M.D., Ph.D. Diseases & Surgery of the Retina and [place] 09/21/2021   I have reviewed the above documentation for accuracy and completeness, and I agree with the above. Gardiner Sleeper, M.D., Ph.D. 09/21/21 11:51 PM  Abbreviations: M myopia (nearsighted); A astigmatism; H hyperopia (farsighted); P presbyopia; Mrx spectacle prescription;  CTL contact lenses; OD right eye; OS left eye; OU both eyes  XT exotropia; ET esotropia; PEK punctate epithelial keratitis; PEE punctate epithelial erosions; DES dry eye syndrome; MGD meibomian gland dysfunction; ATs artificial tears; PFAT's preservative free artificial tears; Nina nuclear sclerotic cataract; PSC posterior subcapsular cataract; ERM epi-retinal membrane; PVD posterior vitreous detachment; RD retinal detachment; DM diabetes mellitus; DR diabetic retinopathy; NPDR non-proliferative diabetic retinopathy; PDR proliferative diabetic retinopathy; CSME clinically significant macular edema; DME diabetic macular edema; dbh dot blot hemorrhages; CWS cotton wool spot; POAG primary open angle glaucoma; C/D cup-to-disc ratio; HVF humphrey visual field; GVF goldmann  visual field; OCT optical coherence tomography; IOP intraocular pressure; BRVO Branch retinal vein occlusion; CRVO central retinal vein occlusion; CRAO central retinal artery occlusion; BRAO branch retinal artery occlusion; RT retinal tear; SB scleral buckle; PPV pars plana vitrectomy; VH Vitreous hemorrhage; PRP panretinal laser photocoagulation; IVK intravitreal kenalog; VMT vitreomacular traction; MH Macular hole;  NVD neovascularization of the disc; NVE neovascularization elsewhere; AREDS age related eye disease study; ARMD age related macular degeneration; POAG primary open angle glaucoma; EBMD epithelial/anterior basement membrane dystrophy; ACIOL anterior chamber intraocular lens; IOL intraocular lens; PCIOL posterior chamber intraocular lens; Phaco/IOL phacoemulsification with intraocular lens placement; [place] photorefractive keratectomy; LASIK laser assisted in situ keratomileusis; HTN hypertension; DM diabetes mellitus; COPD chronic obstructive pulmonary disease

## 2021-09-18 ENCOUNTER — Other Ambulatory Visit: Payer: Self-pay

## 2021-09-18 ENCOUNTER — Emergency Department (HOSPITAL_COMMUNITY)
Admission: EM | Admit: 2021-09-18 | Discharge: 2021-09-18 | Discharge status: Against Medical Advice | Payer: Medicare HMO

## 2021-09-21 ENCOUNTER — Ambulatory Visit (INDEPENDENT_AMBULATORY_CARE_PROVIDER_SITE_OTHER): Payer: Medicare HMO | Admitting: Ophthalmology

## 2021-09-21 ENCOUNTER — Encounter (INDEPENDENT_AMBULATORY_CARE_PROVIDER_SITE_OTHER): Payer: Self-pay | Admitting: Ophthalmology

## 2021-09-21 ENCOUNTER — Other Ambulatory Visit: Payer: Self-pay

## 2021-09-21 DIAGNOSIS — H3581 Retinal edema: Secondary | ICD-10-CM

## 2021-09-21 DIAGNOSIS — H353122 Nonexudative age-related macular degeneration, left eye, intermediate dry stage: Secondary | ICD-10-CM

## 2021-09-21 DIAGNOSIS — H25812 Combined forms of age-related cataract, left eye: Secondary | ICD-10-CM | POA: Diagnosis not present

## 2021-09-21 DIAGNOSIS — H35371 Puckering of macula, right eye: Secondary | ICD-10-CM | POA: Diagnosis not present

## 2021-09-21 DIAGNOSIS — D3132 Benign neoplasm of left choroid: Secondary | ICD-10-CM

## 2021-09-21 DIAGNOSIS — H35033 Hypertensive retinopathy, bilateral: Secondary | ICD-10-CM | POA: Diagnosis not present

## 2021-09-21 DIAGNOSIS — Z961 Presence of intraocular lens: Secondary | ICD-10-CM

## 2021-09-21 DIAGNOSIS — H35351 Cystoid macular degeneration, right eye: Secondary | ICD-10-CM | POA: Diagnosis not present

## 2021-09-21 DIAGNOSIS — I10 Essential (primary) hypertension: Secondary | ICD-10-CM | POA: Diagnosis not present

## 2021-09-28 ENCOUNTER — Other Ambulatory Visit: Payer: Self-pay | Admitting: Cardiology

## 2021-10-01 ENCOUNTER — Other Ambulatory Visit: Payer: Self-pay

## 2021-10-01 ENCOUNTER — Emergency Department (HOSPITAL_COMMUNITY): Payer: Medicare HMO

## 2021-10-01 ENCOUNTER — Emergency Department (HOSPITAL_COMMUNITY)
Admission: EM | Admit: 2021-10-01 | Discharge: 2021-10-01 | Disposition: A | Discharge status: Home / Self Care | Payer: Medicare HMO | Attending: Emergency Medicine | Admitting: Emergency Medicine

## 2021-10-01 ENCOUNTER — Encounter (HOSPITAL_COMMUNITY): Payer: Self-pay

## 2021-10-01 DIAGNOSIS — Z8616 Personal history of COVID-19: Secondary | ICD-10-CM | POA: Insufficient documentation

## 2021-10-01 DIAGNOSIS — S52002A Unspecified fracture of upper end of left ulna, initial encounter for closed fracture: Secondary | ICD-10-CM | POA: Diagnosis not present

## 2021-10-01 DIAGNOSIS — S42402A Unspecified fracture of lower end of left humerus, initial encounter for closed fracture: Secondary | ICD-10-CM | POA: Insufficient documentation

## 2021-10-01 DIAGNOSIS — S0181XA Laceration without foreign body of other part of head, initial encounter: Secondary | ICD-10-CM | POA: Diagnosis not present

## 2021-10-01 DIAGNOSIS — Y92524 Gas station as the place of occurrence of the external cause: Secondary | ICD-10-CM | POA: Insufficient documentation

## 2021-10-01 DIAGNOSIS — I110 Hypertensive heart disease with heart failure: Secondary | ICD-10-CM | POA: Insufficient documentation

## 2021-10-01 DIAGNOSIS — Z7982 Long term (current) use of aspirin: Secondary | ICD-10-CM | POA: Diagnosis not present

## 2021-10-01 DIAGNOSIS — I5032 Chronic diastolic (congestive) heart failure: Secondary | ICD-10-CM | POA: Insufficient documentation

## 2021-10-01 DIAGNOSIS — F0280 Dementia in other diseases classified elsewhere without behavioral disturbance: Secondary | ICD-10-CM | POA: Insufficient documentation

## 2021-10-01 DIAGNOSIS — W010XXA Fall on same level from slipping, tripping and stumbling without subsequent striking against object, initial encounter: Secondary | ICD-10-CM | POA: Insufficient documentation

## 2021-10-01 DIAGNOSIS — E039 Hypothyroidism, unspecified: Secondary | ICD-10-CM | POA: Insufficient documentation

## 2021-10-01 DIAGNOSIS — I6782 Cerebral ischemia: Secondary | ICD-10-CM | POA: Diagnosis not present

## 2021-10-01 DIAGNOSIS — I2510 Atherosclerotic heart disease of native coronary artery without angina pectoris: Secondary | ICD-10-CM | POA: Insufficient documentation

## 2021-10-01 DIAGNOSIS — M7989 Other specified soft tissue disorders: Secondary | ICD-10-CM | POA: Diagnosis not present

## 2021-10-01 DIAGNOSIS — Z87891 Personal history of nicotine dependence: Secondary | ICD-10-CM | POA: Diagnosis not present

## 2021-10-01 DIAGNOSIS — G309 Alzheimer's disease, unspecified: Secondary | ICD-10-CM | POA: Diagnosis not present

## 2021-10-01 DIAGNOSIS — J449 Chronic obstructive pulmonary disease, unspecified: Secondary | ICD-10-CM | POA: Diagnosis not present

## 2021-10-01 DIAGNOSIS — Z043 Encounter for examination and observation following other accident: Secondary | ICD-10-CM | POA: Diagnosis not present

## 2021-10-01 DIAGNOSIS — Z951 Presence of aortocoronary bypass graft: Secondary | ICD-10-CM | POA: Insufficient documentation

## 2021-10-01 DIAGNOSIS — Z7902 Long term (current) use of antithrombotics/antiplatelets: Secondary | ICD-10-CM | POA: Insufficient documentation

## 2021-10-01 DIAGNOSIS — M19022 Primary osteoarthritis, left elbow: Secondary | ICD-10-CM | POA: Diagnosis not present

## 2021-10-01 DIAGNOSIS — M79602 Pain in left arm: Secondary | ICD-10-CM | POA: Diagnosis not present

## 2021-10-01 DIAGNOSIS — M25422 Effusion, left elbow: Secondary | ICD-10-CM | POA: Diagnosis not present

## 2021-10-01 DIAGNOSIS — Z23 Encounter for immunization: Secondary | ICD-10-CM | POA: Insufficient documentation

## 2021-10-01 DIAGNOSIS — S59902A Unspecified injury of left elbow, initial encounter: Secondary | ICD-10-CM | POA: Diagnosis present

## 2021-10-01 MED ORDER — HYDROMORPHONE HCL 1 MG/ML IJ SOLN
1.0000 mg | Freq: Once | INTRAMUSCULAR | Status: AC
  Administered 2021-10-01: 1 mg via INTRAMUSCULAR
  Filled 2021-10-01: qty 1

## 2021-10-01 MED ORDER — OXYCODONE HCL 5 MG PO TABS
30.0000 mg | ORAL_TABLET | Freq: Once | ORAL | Status: AC
  Administered 2021-10-01: 30 mg via ORAL
  Filled 2021-10-01: qty 6

## 2021-10-01 MED ORDER — TETANUS-DIPHTH-ACELL PERTUSSIS 5-2.5-18.5 LF-MCG/0.5 IM SUSY
0.5000 mL | PREFILLED_SYRINGE | Freq: Once | INTRAMUSCULAR | Status: AC
  Administered 2021-10-01: 0.5 mL via INTRAMUSCULAR
  Filled 2021-10-01: qty 0.5

## 2021-10-01 NOTE — Discharge Instructions (Signed)
Your presentation is concerning for an occult fracture in your elbow. Call the orthopedist on 10/7 to set up an appointment. If you develop new/worsening pain, weakness or numbness in your hand, or any other new/concerning symptoms then return to the ER for evaluation.

## 2021-10-01 NOTE — ED Provider Notes (Signed)
[facility] EMERGENCY DEPARTMENT Provider Note   CSN: [id number] Arrival date & time: 10/01/21  1954     History Chief Complaint  Patient presents with   Lytle Michaels    Dustin [name] is a 87 y.o. male.  HPI 87 year old male presents after a fall and left elbow injury.  Patient states that he was going out to the store yesterday and he did not realize how big the step was and he lost his balance and fell.  He did hit his head though is not sure if he lost consciousness.  If he did he states it was for a second or 2.  He has somewhat of a headache but is mostly having severe left elbow pain.  He has been taking his home oxycodone with no relief.  No weakness or numbness in the extremity.  He states at 1 point his left forehead was bleeding from the injury but this seems to have stopped. No neck pain.  Past Medical History:  Diagnosis Date   Alzheimer disease ([place])    Anxiety    Arthritis    Atrophic gastritis    a. By EGD 02/2013.   Carotid artery disease (HCC)    a. mild-mod plaque, <50% stenosis bilat by duplex 2018.   Coronary atherosclerosis of native coronary artery    a. Multivessel s/p CABG 1996. b. s/p DES x 2 SVG to PDA 8/12 with distal disease managed medically.   DDD (degenerative disc disease)    Chronic back pain   Dementia (HCC)    Enlarged prostate    Essential hypertension    Hematuria    Hypothyroidism    LBBB (left bundle branch block)    MI (myocardial infarction) (HCC)    Mixed hyperlipidemia    OA (osteoarthritis)    OSA (obstructive sleep apnea)    Pneumonia due to COVID-19 virus    February 2021   Sinus bradycardia    a. Aricept and BB discontinued due to this.    Patient Active Problem List   Diagnosis Date Noted   COVID-19 virus infection 07/31/2021   Right lower lobe pneumonia 07/31/2021   Respiratory failure, acute ([place]) 40/98/1191   Acute metabolic encephalopathy 47/82/9562   AKI (acute kidney injury) ([place]) 02/10/2021   Dysuria 10/24/2020    Pneumonia due to COVID-19 virus 02/13/2020   COPD (chronic obstructive pulmonary disease) ([place]) 12/10/2019   COPD exacerbation ([place]) 12/07/2019   Accelerating angina ([place]) 11/07/2018   Fall 11/07/2018   Gait instability 11/07/2018   Leukocytosis 11/07/2018   Depression 11/07/2018   Benign prostatic hyperplasia with urinary obstruction 11/07/2018   Chronic pain 11/07/2018   Pressure injury of skin 02/15/2017   Chronic diastolic CHF (congestive heart failure) ([place]) 12/24/2016   Sinus bradycardia 12/23/2016   Foot pain, bilateral 12/23/2016   Hypokalemia 04/05/2016   Acute respiratory failure ([place]) 04/03/2016   Acute encephalopathy 04/03/2016   Hypothyroidism 04/03/2016   Aspiration pneumonia ([place]) 04/03/2016   Dementia ([place]) 04/03/2016   Acute respiratory failure with hypoxia ([place]) 04/03/2016   Memory loss 04/20/2015   Hypoxia 01/27/2015   CAP (community acquired pneumonia) 01/27/2015   Fever 08/30/2014   Healthcare associated bacterial pneumonia 13/07/6577   Toxic Metabolic encephalopathy 46/96/2952   Sepsis ([place]) 08/30/2014   Neck pain on right side 08/29/2014   Neck pain 08/29/2014   Left-sided weakness 06/28/2014   Chest pain 06/27/2014   Tubular adenoma of colon 03/05/2013   Anorexia 03/05/2013   Loss of weight 03/05/2013  Chronic constipation 07/04/2012   Bronchitis 08/16/2011   Hyperlipidemia 10/01/2009   OSA (obstructive sleep apnea) 10/01/2009   Alzheimer disease ([place]) 10/01/2009   HYPERTENSION, BENIGN 10/01/2009   Coronary artery disease 10/01/2009    Past Surgical History:  Procedure Laterality Date   COLONOSCOPY  08/03/2004   [place]-numerous large diverticula in the descending, transverse, descending, and sigmoid colon. Otherwise normal exam.   COLONOSCOPY  07/12/2012   RMR: External hemorrhoidal tag; multiple rectal and colonic polyps removed and/or treated as described above. Pancolonic diverticulosis. Bx-tubular adenomas, rectal hyperplastic polyp. next  colonoscopy in 06/2015.   COLONOSCOPY N/A 06/22/2016   Procedure: COLONOSCOPY;  Surgeon: Aviva Signs, MD;  Location: [facility] END[facility] SU[facility];  Service: Gastroenterology;  Laterality: N/A;  108   CORONARY ANGIOPLASTY WITH STENT PLACEMENT     CORONARY ARTERY BYPASS GRAFT  1996   LIMA to LAD, SVG to D2, SVG to PDA, SVG to OM1 and OM2   CORONARY STENT INTERVENTION N/A 12/10/2019   Procedure: CORONARY STENT INTERVENTION;  Surgeon: Burnell Blanks, MD;  Location: [place] [facility];  Service: Cardiovascular;  Laterality: N/A;   ESOPHAGOGASTRODUODENOSCOPY N/A 03/16/2013   Procedure: ESOPHAGOGASTRODUODENOSCOPY (EGD);  Surgeon: Daneil Dolin, MD;  Location: [facility];  Service: Endoscopy;  Laterality: N/A;  12:00-moved to [place] notified pt   ESOPHAGOGASTRODUODENOSCOPY N/A 03/06/2013   Procedure: ESOPHAGOGASTRODUODENOSCOPY (EGD);  Surgeon: Daneil Dolin, MD;  Location: [facility];  Service: Endoscopy;  Laterality: N/A;   HERNIA REPAIR     INTRAVASCULAR ULTRASOUND/IVUS N/A 12/10/2019   Procedure: Intravascular Ultrasound/IVUS;  Surgeon: Burnell Blanks, MD;  Location: [place] [facility];  Service: Cardiovascular;  Laterality: N/A;   LEFT HEART CATH AND CORS/GRAFTS ANGIOGRAPHY N/A 12/10/2019   Procedure: LEFT HEART CATH AND CORS/GRAFTS ANGIOGRAPHY;  Surgeon: Burnell Blanks, MD;  Location: [place] [facility];  Service: Cardiovascular;  Laterality: N/A;   LEFT HEART CATH AND CORS/GRAFTS ANGIOGRAPHY N/A 03/02/2021   Procedure: LEFT HEART CATH AND CORS/GRAFTS ANGIOGRAPHY;  Surgeon: Nelva Bush, MD;  Location: [place] [facility];  Service: Cardiovascular;  Laterality: N/A;       Family History  Problem Relation Age of Onset   Heart disease Other    Heart attack Mother    Colon cancer Neg Hx     Social History   Tobacco Use   Smoking status: Former    Packs/day: 2.00    Years: 40.00    Pack years: 80.00    Types: Cigarettes    Quit date: 12/27/1994    Years since  quitting: 26.7   Smokeless tobacco: Never  Vaping Use   Vaping Use: Never used  Substance Use Topics   Alcohol use: No    Alcohol/week: 0.0 standard drinks   Drug use: No    Home Medications Prior to Admission medications   Medication Sig Start Date End Date Taking? Authorizing Provider  albuterol (PROVENTIL) (2.5 MG/3ML) 0.083% nebulizer solution Take 3 mLs (2.5 mg total) by nebulization every 6 (six) hours as needed for wheezing or shortness of breath. Patient will also need the nebulizer machine with this prescription. 06/09/21   Chesley Mires, MD  albuterol (VENTOLIN HFA) 108 (90 Base) MCG/ACT inhaler Inhale 2 puffs into the lungs every 6 (six) hours as needed for wheezing or shortness of breath. 06/09/21   Chesley Mires, MD  alfuzosin (UROXATRAL) 10 MG 24 hr tablet Take 1 tablet (10 mg total) by mouth daily with breakfast. 03/09/21   McKenzie, Candee Furbish,  MD  aspirin EC 81 MG tablet Take 81 mg by mouth daily.    [provider]  Budeson-Glycopyrrol-Formoterol (BREZTRI AEROSPHERE) 160-9-4.8 MCG/ACT AERO Inhale 2 puffs into the lungs in the morning and at bedtime. 06/09/21   Chesley Mires, MD  budesonide-formoterol (SYMBICORT) 160-4.5 MCG/ACT inhaler Inhale 2 puffs into the lungs 2 (two) times daily.    [provider]  busPIRone (BUSPAR) 10 MG tablet Take 10 mg by mouth 2 (two) times daily. 11/03/18   [provider]  clopidogrel (PLAVIX) 75 MG tablet Take 1 tablet (75 mg total) by mouth daily. 09/28/21   Satira Sark, MD  doxazosin (CARDURA) 1 MG tablet Take 1 mg by mouth at bedtime. 09/02/20   [provider]  dutasteride (AVODART) 0.5 MG capsule Take 1 capsule (0.5 mg total) by mouth daily. In the afternoon 03/09/21   Cleon Gustin, MD  escitalopram (LEXAPRO) 20 MG tablet Take 20 mg by mouth every morning.     [provider]  furosemide (LASIX) 40 MG tablet Take 1 tablet (40 mg total) by mouth in the morning. Patient taking differently:  Take 40 mg by mouth daily. 03/02/21   End, Harrell Gave, MD  hydrOXYzine (ATARAX/VISTARIL) 25 MG tablet Take 25 mg by mouth 2 (two) times daily as needed for anxiety. 11/19/19   [provider]  isosorbide mononitrate (IMDUR) 60 MG 24 hr tablet Take 120 mg by mouth daily. 02/17/21   [provider]  levothyroxine (SYNTHROID, LEVOTHROID) 50 MCG tablet Take 50 mcg by mouth every morning. 10/04/16   [provider]  meclizine (ANTIVERT) 25 MG tablet Take 25 mg by mouth 4 (four) times daily as needed for dizziness.    [provider]  memantine (NAMENDA) 10 MG tablet Take 10 mg by mouth 2 (two) times daily.  03/05/16   [provider]  MOVANTIK 25 MG TABS tablet Take 25 mg by mouth daily. 08/30/20   [provider]  mupirocin ointment (BACTROBAN) 2 % Apply 1 application topically 3 (three) times daily as needed (affected areas on legs).    [provider]  nitroGLYCERIN (NITROSTAT) 0.4 MG SL tablet Place 1 tablet (0.4 mg total) under the tongue every 5 (five) minutes as needed for chest pain. Pt has bought nitro bottle from home and oxycodone 30 mg ( 3 or 4 tabs) is in nitro bottle per wife Dina Mobley. Patient taking differently: Place 0.4 mg under the tongue every 5 (five) minutes as needed for chest pain. 11/04/20   Satira Sark, MD  pantoprazole (PROTONIX) 40 MG tablet Take 1 tablet (40 mg total) by mouth every morning. 02/16/20   Barton Dubois, MD  potassium chloride SA (K-DUR,KLOR-CON) 20 MEQ tablet Take 20 mEq by mouth daily.     [provider]  QUEtiapine (SEROQUEL) 25 MG tablet Take 25 mg by mouth at bedtime as needed (sleep). 11/19/19   [provider]  ranolazine (RANEXA) 500 MG 12 hr tablet Take 1 tablet (500 mg total) by mouth 2 (two) times daily. 03/23/21   Imogene Burn, PA-C  rosuvastatin (CRESTOR) 40 MG tablet Take 1 tablet (40 mg total) by mouth daily at 6 PM. 12/11/19   Hollice Gong, Mir Earlie Server, MD   tamsulosin (FLOMAX) 0.4 MG CAPS capsule Take 0.4 mg by mouth daily.    [provider]  triamcinolone cream (KENALOG) 0.1 % Apply 1 application topically 2 (two) times daily as needed (To affected areas on legs).    [provider]    Allergies    Patient has no known allergies.  Review of Systems   Review of Systems  Cardiovascular:  Negative for chest pain.  Musculoskeletal:  Positive for arthralgias and joint swelling.  Neurological:  Positive for headaches. Negative for weakness and numbness.  All other systems reviewed and are negative.  Physical Exam Updated Vital Signs BP (!) 155/54   Pulse 66   Temp 98.3 F (36.8 C) (Oral)   Resp (!) 23   Ht 5\' 9"  (1.753 m)   Wt 99.8 kg   SpO2 95%   BMI 32.49 kg/m   Physical Exam Vitals and nursing note reviewed.  Constitutional:      Appearance: He is well-developed.  HENT:     Head: Normocephalic. Contusion and laceration present.      Comments: No maxillary/mandibular tenderness    Right Ear: External ear normal.     Left Ear: External ear normal.     Nose: Nose normal.  Eyes:     General:        Right eye: No discharge.        Left eye: No discharge.  Cardiovascular:     Rate and Rhythm: Normal rate and regular rhythm.     Pulses:          Radial pulses are 2+ on the left side.  Pulmonary:     Effort: Pulmonary effort is normal.     Breath sounds: Normal breath sounds.  Abdominal:     Palpations: Abdomen is soft.     Tenderness: There is no abdominal tenderness.  Musculoskeletal:     Left upper arm: No tenderness.     Left elbow: Swelling present. No deformity. Decreased range of motion. Tenderness present.     Left forearm: No tenderness.     Left wrist: No swelling or tenderness. Normal range of motion.     Cervical back: Neck supple. No spinous process tenderness or muscular tenderness.     Right hip: No tenderness. Normal range of motion.     Left hip: No tenderness. Normal range of  motion.     Comments: Normal strength in left hand, though limited due to pain. Grossly normal radial, ulnar, median nerve testing in left hand. Normal sensation  Skin:    General: Skin is warm and dry.  Neurological:     Mental Status: He is alert.  Psychiatric:        Mood and Affect: Mood is not anxious.    ED Results / Procedures / Treatments   Labs (all labs ordered are listed, but only abnormal results are displayed) Labs Reviewed - No data to display  EKG None  Radiology DG Elbow Complete Left  Result Date: 10/01/2021 CLINICAL DATA:  Recent fall with elbow pain, initial encounter EXAM: LEFT ELBOW - COMPLETE 3+ VIEW COMPARISON:  None. FINDINGS: Mild degenerative changes of the elbow joint are seen. Joint effusion is noted although no acute fracture is seen. Mild soft tissue swelling is noted posteriorly. IMPRESSION: No definitive fracture is seen. Mild joint effusion and soft tissue swelling. Electronically Signed   By: Inez Catalina M.D.   On: 10/01/2021 21:01   CT Head Wo Contrast  Result Date: 10/01/2021 CLINICAL DATA:  [place] [address] at gas station EXAM: CT HEAD WITHOUT CONTRAST TECHNIQUE: Contiguous axial images were obtained from the base of the skull through the vertex without intravenous contrast. COMPARISON:  CT brain 07/30/2021 FINDINGS: Brain: No acute territorial infarction, hemorrhage or intracranial mass.  The ventricles are stable in size. Moderate atrophy. Minimal chronic small vessel ischemic changes of the white matter. Probable chronic lacunar infarct in the right basal ganglia Vascular: No hyperdense vessels.  Carotid vascular calcification Skull: Normal. Negative for fracture or focal lesion. Sinuses/Orbits: Patchy mucosal thickening in the ethmoid sinuses Other: None IMPRESSION: 1. No CT evidence for acute intracranial abnormality. 2. Atrophy and minimal chronic small vessel ischemic change of the white matter Electronically Signed   By: Donavan Foil M.D.   On: 10/01/2021  21:12   DG Humerus Left  Result Date: 10/01/2021 CLINICAL DATA:  Recent fall with left arm pain, initial encounter EXAM: LEFT HUMERUS - 2+ VIEW COMPARISON:  None. FINDINGS: There is no evidence of fracture or other focal bone lesions. Soft tissues are unremarkable. Degenerative changes of the acromioclavicular joint are noted. IMPRESSION: No acute abnormality noted. Electronically Signed   By: Inez Catalina M.D.   On: 10/01/2021 21:01    Procedures Procedures   Medications Ordered in ED Medications  Tdap (BOOSTRIX) injection 0.5 mL (0.5 mLs Intramuscular Given 10/01/21 2244)  HYDROmorphone (DILAUDID) injection 1 mg (1 mg Intramuscular Given 10/01/21 2242)  oxyCODONE (Oxy IR/ROXICODONE) immediate release tablet 30 mg (30 mg Oral Given 10/01/21 2311)    ED Course  I have reviewed the triage vital signs and the nursing notes.  Pertinent labs & imaging results that were available during my care of the patient were reviewed by me and considered in my medical decision making (see chart for details).    MDM Rules/Calculators/A&P                           Imaging ordered in triage reviewed.  No obvious head injury. His elbow does not show an obvious fracture but with a joint effusion and very limited range of motion, I am concerned for an occult fracture.  I discussed with Dr. Aline Brochure who given his degree of pain recommends a splint and sling and call the office tomorrow for outpatient follow-up.  He is already on 30 mg IR oxycodone and so while I cannot adjust his outpatient meds I will give him his home dose here as he has not taken it in over 7 hours as well as a dose of IM Dilaudid.  Will update Tdap.  Discharged home with return precautions.  No obvious neurovascular injury. Final Clinical Impression(s) / ED Diagnoses Final diagnoses:  Occult closed fracture of left elbow, initial encounter    Rx / DC Orders ED Discharge Orders     None        [name], MD 10/01/21 2313

## 2021-10-01 NOTE — ED Triage Notes (Addendum)
Pt here from home with wife with cc of tripping and falling at a gas a station. He said he didn't realize there was a step off. Has bruising has laceration to left side of outer eye. Also has swelling and pain to left elbow.  Unsure if there was any LOC  but doesn't think so.  This happened yesterday evening. Thought that he could just sleep it off but his elbow never stopped hurting.  Swelling noted.

## 2021-10-05 DIAGNOSIS — E663 Overweight: Secondary | ICD-10-CM | POA: Diagnosis not present

## 2021-10-05 DIAGNOSIS — Z6829 Body mass index (BMI) 29.0-29.9, adult: Secondary | ICD-10-CM | POA: Diagnosis not present

## 2021-10-05 DIAGNOSIS — J449 Chronic obstructive pulmonary disease, unspecified: Secondary | ICD-10-CM | POA: Diagnosis not present

## 2021-10-05 DIAGNOSIS — E063 Autoimmune thyroiditis: Secondary | ICD-10-CM | POA: Diagnosis not present

## 2021-10-05 DIAGNOSIS — G894 Chronic pain syndrome: Secondary | ICD-10-CM | POA: Diagnosis not present

## 2021-10-05 DIAGNOSIS — M1991 Primary osteoarthritis, unspecified site: Secondary | ICD-10-CM | POA: Diagnosis not present

## 2021-10-05 DIAGNOSIS — M48061 Spinal stenosis, lumbar region without neurogenic claudication: Secondary | ICD-10-CM | POA: Diagnosis not present

## 2021-10-05 DIAGNOSIS — I10 Essential (primary) hypertension: Secondary | ICD-10-CM | POA: Diagnosis not present

## 2021-10-07 ENCOUNTER — Ambulatory Visit: Payer: Medicare HMO

## 2021-10-07 ENCOUNTER — Encounter: Payer: Self-pay | Admitting: Orthopedic Surgery

## 2021-10-07 ENCOUNTER — Ambulatory Visit: Payer: Medicare HMO | Admitting: Orthopedic Surgery

## 2021-10-07 ENCOUNTER — Other Ambulatory Visit: Payer: Self-pay

## 2021-10-07 VITALS — BP 119/67 | HR 67 | Ht 69.0 in | Wt 218.0 lb

## 2021-10-07 DIAGNOSIS — S5002XA Contusion of left elbow, initial encounter: Secondary | ICD-10-CM

## 2021-10-07 DIAGNOSIS — M79642 Pain in left hand: Secondary | ICD-10-CM | POA: Diagnosis not present

## 2021-10-07 NOTE — Progress Notes (Signed)
Chief Complaint  Patient presents with   Elbow Injury    Closed fracture LT elbow/ hand is swollen LEFT/DOI 10/01/21 (fall)   87 year old male fell coming out of a store on October 6 he had severe elbow pain x-rays were negative for fracture but did show an effusion was placed in posterior splint he comes in today complaining of swelling of his left hand and mild pain in his left elbow.  His initial presentation to the ER he had severe pain in the elbow according to the records MI, saltation with the treating physician  He does not smoke is not diabetic  I reviewed his past family and social history nothing contributory to this current problem  BP 119/67   Pulse 67   Ht 5\' 9"  (1.753 m)   Wt 218 lb (98.9 kg)   BMI 32.19 kg/m    He is awake alert and oriented x3 has a staggering waddling gait we took his splint off his skin was intact the radial head lateral and medial epicondyle and olecranon were nontender he had proper alignment.  He had no pain with rotation of the forearm  His hand was swollen and he asked that we x-rayed  There is no tenderness in the hand and no tenderness in the forearm as the complete radial shaft and ulnar shaft were palpated  X-ray of the hand was negative  I reviewed the x-ray of the elbow there is no fracture or dislocation in my opinion  There was an effusion  This does or can represent occult fracture  Clinical exam suggest that there is not 1 and this was just a contusion  I rewrapped his hand I showed his wife how to apply the Ace bandage to get the swelling out I recommended he use ice and elevation and see me in 2 weeks to check the swelling of the hand  He felt better with the splint and sling off and they were left off.

## 2021-10-12 DIAGNOSIS — N179 Acute kidney failure, unspecified: Secondary | ICD-10-CM | POA: Diagnosis not present

## 2021-10-12 DIAGNOSIS — G9341 Metabolic encephalopathy: Secondary | ICD-10-CM | POA: Diagnosis not present

## 2021-10-12 DIAGNOSIS — J1282 Pneumonia due to coronavirus disease 2019: Secondary | ICD-10-CM | POA: Diagnosis not present

## 2021-10-12 DIAGNOSIS — U071 COVID-19: Secondary | ICD-10-CM | POA: Diagnosis not present

## 2021-10-13 DIAGNOSIS — G9341 Metabolic encephalopathy: Secondary | ICD-10-CM | POA: Diagnosis not present

## 2021-10-13 DIAGNOSIS — N179 Acute kidney failure, unspecified: Secondary | ICD-10-CM | POA: Diagnosis not present

## 2021-10-13 DIAGNOSIS — J1282 Pneumonia due to coronavirus disease 2019: Secondary | ICD-10-CM | POA: Diagnosis not present

## 2021-10-13 DIAGNOSIS — U071 COVID-19: Secondary | ICD-10-CM | POA: Diagnosis not present

## 2021-10-21 ENCOUNTER — Ambulatory Visit: Payer: Medicare HMO | Admitting: Orthopedic Surgery

## 2021-10-23 ENCOUNTER — Ambulatory Visit: Payer: Medicare HMO | Admitting: Pulmonary Disease

## 2021-10-26 ENCOUNTER — Encounter: Payer: Self-pay | Admitting: Orthopedic Surgery

## 2021-10-26 ENCOUNTER — Ambulatory Visit: Payer: Medicare HMO | Admitting: Orthopedic Surgery

## 2021-11-03 DIAGNOSIS — Z6828 Body mass index (BMI) 28.0-28.9, adult: Secondary | ICD-10-CM | POA: Diagnosis not present

## 2021-11-03 DIAGNOSIS — E063 Autoimmune thyroiditis: Secondary | ICD-10-CM | POA: Diagnosis not present

## 2021-11-03 DIAGNOSIS — M48061 Spinal stenosis, lumbar region without neurogenic claudication: Secondary | ICD-10-CM | POA: Diagnosis not present

## 2021-11-03 DIAGNOSIS — Z23 Encounter for immunization: Secondary | ICD-10-CM | POA: Diagnosis not present

## 2021-11-03 DIAGNOSIS — G894 Chronic pain syndrome: Secondary | ICD-10-CM | POA: Diagnosis not present

## 2021-11-03 DIAGNOSIS — I10 Essential (primary) hypertension: Secondary | ICD-10-CM | POA: Diagnosis not present

## 2021-11-27 DIAGNOSIS — Z6829 Body mass index (BMI) 29.0-29.9, adult: Secondary | ICD-10-CM | POA: Diagnosis not present

## 2021-11-27 DIAGNOSIS — M48062 Spinal stenosis, lumbar region with neurogenic claudication: Secondary | ICD-10-CM | POA: Diagnosis not present

## 2021-12-09 DIAGNOSIS — Z6828 Body mass index (BMI) 28.0-28.9, adult: Secondary | ICD-10-CM | POA: Diagnosis not present

## 2021-12-09 DIAGNOSIS — E663 Overweight: Secondary | ICD-10-CM | POA: Diagnosis not present

## 2021-12-09 DIAGNOSIS — M48061 Spinal stenosis, lumbar region without neurogenic claudication: Secondary | ICD-10-CM | POA: Diagnosis not present

## 2021-12-09 DIAGNOSIS — E039 Hypothyroidism, unspecified: Secondary | ICD-10-CM | POA: Diagnosis not present

## 2021-12-09 DIAGNOSIS — I10 Essential (primary) hypertension: Secondary | ICD-10-CM | POA: Diagnosis not present

## 2021-12-09 DIAGNOSIS — I209 Angina pectoris, unspecified: Secondary | ICD-10-CM | POA: Diagnosis not present

## 2021-12-09 DIAGNOSIS — G894 Chronic pain syndrome: Secondary | ICD-10-CM | POA: Diagnosis not present

## 2021-12-29 ENCOUNTER — Ambulatory Visit: Payer: Medicare HMO | Admitting: Pulmonary Disease

## 2021-12-31 ENCOUNTER — Other Ambulatory Visit: Payer: Self-pay

## 2021-12-31 ENCOUNTER — Encounter: Payer: Self-pay | Admitting: Pulmonary Disease

## 2021-12-31 ENCOUNTER — Ambulatory Visit (INDEPENDENT_AMBULATORY_CARE_PROVIDER_SITE_OTHER): Payer: Medicare HMO | Admitting: Pulmonary Disease

## 2021-12-31 VITALS — BP 134/72 | HR 72 | Temp 98.8°F | Ht 70.0 in | Wt 208.1 lb

## 2021-12-31 DIAGNOSIS — J449 Chronic obstructive pulmonary disease, unspecified: Secondary | ICD-10-CM | POA: Diagnosis not present

## 2021-12-31 MED ORDER — ALBUTEROL SULFATE HFA 108 (90 BASE) MCG/ACT IN AERS: 2.0000 | INHALATION_SPRAY | Freq: Four times a day (QID) | RESPIRATORY_TRACT | 3 refills | Status: DC | PRN

## 2021-12-31 MED ORDER — BREZTRI AEROSPHERE 160-9-4.8 MCG/ACT IN AERO: 2.0000 | INHALATION_SPRAY | Freq: Two times a day (BID) | RESPIRATORY_TRACT | 5 refills | Status: DC

## 2021-12-31 NOTE — Progress Notes (Signed)
[facility]  Chief Complaint  Patient presents with   Follow-up    Feels breathing has somewhat worsened since last OV. States from dec.- feb has trouble breathing every year.     Constitutional:  BP 134/72    Pulse 72    Temp 98.8 F (37.1 C)    Ht 5\' 10"  (1.778 m)    Wt 208 lb 1.3 oz (94.4 kg)    SpO2 96% Comment: ra   BMI 29.86 kg/m   Past Medical History:  Alzheimer's disease, Anxiety, Gastritis, CAD, DDD, BPH, HTN, Hypothyroidism, LBBB, HLD, COVID 19 PNA February 2021, PNA September 2015 and MAy 2022  Past Surgical History:  He  has a past surgical history that includes Coronary artery bypass graft (1996); Hernia repair; Coronary angioplasty with stent; Colonoscopy (08/03/2004); Colonoscopy (07/12/2012); Esophagogastroduodenoscopy (N/A, 03/16/2013); Esophagogastroduodenoscopy (N/A, 03/06/2013); Colonoscopy (N/A, 06/22/2016); LEFT HEART CATH AND CORS/GRAFTS ANGIOGRAPHY (N/A, 12/10/2019); CORONARY STENT INTERVENTION (N/A, 12/10/2019); Intravascular Ultrasound/IVUS (N/A, 12/10/2019); and LEFT HEART CATH AND CORS/GRAFTS ANGIOGRAPHY (N/A, 03/02/2021).  Brief Summary:  Dustin [name] is a 88 y.o. male former smoker with dyspnea.      Subjective:   He is here with his wife.  He was doing better after his last visit.  Breztri helped.  He ran out of his inhaler and then breathing got worse.  Has intermittent cough and wheeze.  No usually bringing up sputum.  Gets pain in his chest at times and this can happen at rest.  Physical Exam:   Appearance - well kempt   ENMT - no sinus tenderness, no oral exudate, no LAN, Mallampati 2 airway, no stridor, decreased hearing acuity  Respiratory - equal breath sounds bilaterally, no wheezing or rales  CV - s1s2 regular rate and rhythm, no murmurs  Ext - no clubbing, no edema  Skin - no rashes  Psych - normal mood and affect    Pulmonary testing:    Chest Imaging:  CT angio chest 09/21/20 >>  atherosclerosis  Cardiac Tests:  Echo 12/10/19 >> EF 55 to 60%, grade 1 DD  Social History:  He  reports that he quit smoking about 27 years ago. His smoking use included cigarettes. He has a 80.00 pack-year smoking history. He has never used smokeless tobacco. He reports that he does not drink alcohol and does not use drugs.  Family History:  His family history includes Heart attack in his mother; Heart disease in an other family member.     Assessment/Plan:   COPD. - will have him resume breztri - prn albuterol - discussed roles for his different inhalers - defer PFT for now since results not likely to change management at this point  Chest pain. - advised him to follow up with cardiology if this persists after resuming his inhalers  Time Spent Involved in Patient Care on Day of Examination:  27 minutes  Follow up:   Patient Instructions  Breztri two puffs in the morning and two puffs in the evening  Ventolin two puffs every 6 hours as needed for cough, wheeze, or chest congestion  Follow up in 6 months  Medication List:   Allergies as of 12/31/2021   No Known Allergies      Medication List        Accurate as of December 31, 2021 12:23 PM. If you have any questions, ask your nurse or doctor.          STOP taking these medications  budesonide-formoterol 160-4.5 MCG/ACT inhaler Commonly known as: SYMBICORT Stopped by: Chesley Mires, MD       TAKE these medications    albuterol (2.5 MG/3ML) 0.083% nebulizer solution Commonly known as: PROVENTIL Take 3 mLs (2.5 mg total) by nebulization every 6 (six) hours as needed for wheezing or shortness of breath. Patient will also need the nebulizer machine with this prescription.   albuterol 108 (90 Base) MCG/ACT inhaler Commonly known as: VENTOLIN HFA Inhale 2 puffs into the lungs every 6 (six) hours as needed for wheezing or shortness of breath.   alfuzosin 10 MG 24 hr tablet Commonly known as:  UROXATRAL Take 1 tablet (10 mg total) by mouth daily with breakfast.   aspirin EC 81 MG tablet Take 81 mg by mouth daily.   Breztri Aerosphere 160-9-4.8 MCG/ACT Aero Generic drug: Budeson-Glycopyrrol-Formoterol Inhale 2 puffs into the lungs in the morning and at bedtime.   busPIRone 10 MG tablet Commonly known as: BUSPAR Take 10 mg by mouth 2 (two) times daily.   clopidogrel 75 MG tablet Commonly known as: PLAVIX Take 1 tablet (75 mg total) by mouth daily.   doxazosin 1 MG tablet Commonly known as: CARDURA Take 1 mg by mouth at bedtime.   dutasteride 0.5 MG capsule Commonly known as: AVODART Take 1 capsule (0.5 mg total) by mouth daily. In the afternoon   escitalopram 20 MG tablet Commonly known as: LEXAPRO Take 20 mg by mouth every morning.   furosemide 40 MG tablet Commonly known as: LASIX Take 1 tablet (40 mg total) by mouth in the morning. What changed: when to take this   hydrOXYzine 25 MG tablet Commonly known as: ATARAX Take 25 mg by mouth 2 (two) times daily as needed for anxiety.   isosorbide mononitrate 60 MG 24 hr tablet Commonly known as: IMDUR Take 120 mg by mouth daily.   levothyroxine 50 MCG tablet Commonly known as: SYNTHROID Take 50 mcg by mouth every morning.   meclizine 25 MG tablet Commonly known as: ANTIVERT Take 25 mg by mouth 4 (four) times daily as needed for dizziness.   memantine 10 MG tablet Commonly known as: NAMENDA Take 10 mg by mouth 2 (two) times daily.   Movantik 25 MG Tabs tablet Generic drug: naloxegol oxalate Take 25 mg by mouth daily.   mupirocin ointment 2 % Commonly known as: BACTROBAN Apply 1 application topically 3 (three) times daily as needed (affected areas on legs).   nitroGLYCERIN 0.4 MG SL tablet Commonly known as: NITROSTAT Place 1 tablet (0.4 mg total) under the tongue every 5 (five) minutes as needed for chest pain. Pt has bought nitro bottle from home and oxycodone 30 mg ( 3 or 4 tabs) is in nitro  bottle per wife Dustin [name]. What changed: additional instructions   oxycodone 30 MG immediate release tablet Commonly known as: ROXICODONE Take 30 mg by mouth every 4 (four) hours as needed.   pantoprazole 40 MG tablet Commonly known as: PROTONIX Take 1 tablet (40 mg total) by mouth every morning.   potassium chloride SA 20 MEQ tablet Commonly known as: KLOR-CON M Take 20 mEq by mouth daily.   QUEtiapine 25 MG tablet Commonly known as: SEROQUEL Take 25 mg by mouth at bedtime as needed (sleep).   ranolazine 500 MG 12 hr tablet Commonly known as: Ranexa Take 1 tablet (500 mg total) by mouth 2 (two) times daily.   rosuvastatin 40 MG tablet Commonly known as: CRESTOR Take 1 tablet (40 mg total) by mouth  daily at 6 PM.   tamsulosin 0.4 MG Caps capsule Commonly known as: FLOMAX Take 0.4 mg by mouth daily.   triamcinolone cream 0.1 % Commonly known as: KENALOG Apply 1 application topically 2 (two) times daily as needed (To affected areas on legs).        Signature:  Chesley Mires, MD [place] Pager - [phone] 12/31/2021, 12:23 PM

## 2021-12-31 NOTE — Patient Instructions (Signed)
Breztri two puffs in the morning and two puffs in the evening  Ventolin two puffs every 6 hours as needed for cough, wheeze, or chest congestion  Follow up in 6 months

## 2022-01-08 DIAGNOSIS — I10 Essential (primary) hypertension: Secondary | ICD-10-CM | POA: Diagnosis not present

## 2022-01-08 DIAGNOSIS — M48061 Spinal stenosis, lumbar region without neurogenic claudication: Secondary | ICD-10-CM | POA: Diagnosis not present

## 2022-01-08 DIAGNOSIS — E039 Hypothyroidism, unspecified: Secondary | ICD-10-CM | POA: Diagnosis not present

## 2022-01-08 DIAGNOSIS — E6609 Other obesity due to excess calories: Secondary | ICD-10-CM | POA: Diagnosis not present

## 2022-01-08 DIAGNOSIS — Z6829 Body mass index (BMI) 29.0-29.9, adult: Secondary | ICD-10-CM | POA: Diagnosis not present

## 2022-01-08 DIAGNOSIS — E782 Mixed hyperlipidemia: Secondary | ICD-10-CM | POA: Diagnosis not present

## 2022-01-08 DIAGNOSIS — I209 Angina pectoris, unspecified: Secondary | ICD-10-CM | POA: Diagnosis not present

## 2022-01-08 DIAGNOSIS — Z1331 Encounter for screening for depression: Secondary | ICD-10-CM | POA: Diagnosis not present

## 2022-01-08 DIAGNOSIS — Z0001 Encounter for general adult medical examination with abnormal findings: Secondary | ICD-10-CM | POA: Diagnosis not present

## 2022-01-08 DIAGNOSIS — E063 Autoimmune thyroiditis: Secondary | ICD-10-CM | POA: Diagnosis not present

## 2022-02-08 DIAGNOSIS — G894 Chronic pain syndrome: Secondary | ICD-10-CM | POA: Diagnosis not present

## 2022-02-08 DIAGNOSIS — E063 Autoimmune thyroiditis: Secondary | ICD-10-CM | POA: Diagnosis not present

## 2022-02-08 DIAGNOSIS — I10 Essential (primary) hypertension: Secondary | ICD-10-CM | POA: Diagnosis not present

## 2022-02-08 DIAGNOSIS — Z6830 Body mass index (BMI) 30.0-30.9, adult: Secondary | ICD-10-CM | POA: Diagnosis not present

## 2022-02-08 DIAGNOSIS — M48061 Spinal stenosis, lumbar region without neurogenic claudication: Secondary | ICD-10-CM | POA: Diagnosis not present

## 2022-02-08 DIAGNOSIS — E6609 Other obesity due to excess calories: Secondary | ICD-10-CM | POA: Diagnosis not present

## 2022-02-08 DIAGNOSIS — N419 Inflammatory disease of prostate, unspecified: Secondary | ICD-10-CM | POA: Diagnosis not present

## 2022-02-08 DIAGNOSIS — D649 Anemia, unspecified: Secondary | ICD-10-CM | POA: Diagnosis not present

## 2022-02-18 DIAGNOSIS — H35371 Puckering of macula, right eye: Secondary | ICD-10-CM | POA: Diagnosis not present

## 2022-02-18 DIAGNOSIS — H01001 Unspecified blepharitis right upper eyelid: Secondary | ICD-10-CM | POA: Diagnosis not present

## 2022-02-18 DIAGNOSIS — H01002 Unspecified blepharitis right lower eyelid: Secondary | ICD-10-CM | POA: Diagnosis not present

## 2022-02-18 DIAGNOSIS — H353132 Nonexudative age-related macular degeneration, bilateral, intermediate dry stage: Secondary | ICD-10-CM | POA: Diagnosis not present

## 2022-02-18 DIAGNOSIS — H25812 Combined forms of age-related cataract, left eye: Secondary | ICD-10-CM | POA: Diagnosis not present

## 2022-02-18 DIAGNOSIS — H01004 Unspecified blepharitis left upper eyelid: Secondary | ICD-10-CM | POA: Diagnosis not present

## 2022-03-08 DIAGNOSIS — R300 Dysuria: Secondary | ICD-10-CM | POA: Diagnosis not present

## 2022-03-08 DIAGNOSIS — G894 Chronic pain syndrome: Secondary | ICD-10-CM | POA: Diagnosis not present

## 2022-03-08 DIAGNOSIS — J449 Chronic obstructive pulmonary disease, unspecified: Secondary | ICD-10-CM | POA: Diagnosis not present

## 2022-03-08 DIAGNOSIS — E6609 Other obesity due to excess calories: Secondary | ICD-10-CM | POA: Diagnosis not present

## 2022-03-08 DIAGNOSIS — M48061 Spinal stenosis, lumbar region without neurogenic claudication: Secondary | ICD-10-CM | POA: Diagnosis not present

## 2022-03-08 DIAGNOSIS — E063 Autoimmune thyroiditis: Secondary | ICD-10-CM | POA: Diagnosis not present

## 2022-03-08 DIAGNOSIS — E039 Hypothyroidism, unspecified: Secondary | ICD-10-CM | POA: Diagnosis not present

## 2022-03-08 DIAGNOSIS — I10 Essential (primary) hypertension: Secondary | ICD-10-CM | POA: Diagnosis not present

## 2022-03-08 DIAGNOSIS — N419 Inflammatory disease of prostate, unspecified: Secondary | ICD-10-CM | POA: Diagnosis not present

## 2022-03-10 ENCOUNTER — Other Ambulatory Visit: Payer: Self-pay

## 2022-03-10 ENCOUNTER — Ambulatory Visit (INDEPENDENT_AMBULATORY_CARE_PROVIDER_SITE_OTHER): Payer: Medicare HMO | Admitting: Urology

## 2022-03-10 ENCOUNTER — Encounter: Payer: Self-pay | Admitting: Urology

## 2022-03-10 VITALS — BP 106/61 | HR 71

## 2022-03-10 DIAGNOSIS — N138 Other obstructive and reflux uropathy: Secondary | ICD-10-CM | POA: Diagnosis not present

## 2022-03-10 DIAGNOSIS — R300 Dysuria: Secondary | ICD-10-CM

## 2022-03-10 DIAGNOSIS — R351 Nocturia: Secondary | ICD-10-CM

## 2022-03-10 DIAGNOSIS — N401 Enlarged prostate with lower urinary tract symptoms: Secondary | ICD-10-CM | POA: Diagnosis not present

## 2022-03-10 LAB — URINALYSIS, ROUTINE W REFLEX MICROSCOPIC
Bilirubin, UA: NEGATIVE
Glucose, UA: NEGATIVE
Ketones, UA: NEGATIVE
Leukocytes,UA: NEGATIVE
Nitrite, UA: NEGATIVE
Specific Gravity, UA: 1.015 (ref 1.005–1.030)
Urobilinogen, Ur: 0.2 mg/dL (ref 0.2–1.0)
pH, UA: 5.5 (ref 5.0–7.5)

## 2022-03-10 LAB — MICROSCOPIC EXAMINATION
Bacteria, UA: NONE SEEN
Renal Epithel, UA: NONE SEEN /hpf
WBC, UA: NONE SEEN /hpf (ref 0–5)

## 2022-03-10 LAB — BLADDER SCAN AMB NON-IMAGING: Scan Result: 1

## 2022-03-10 MED ORDER — SULFAMETHOXAZOLE-TRIMETHOPRIM 800-160 MG PO TABS: 1.0000 | ORAL_TABLET | Freq: Two times a day (BID) | ORAL | 0 refills | Status: DC

## 2022-03-10 MED ORDER — ALFUZOSIN HCL ER 10 MG PO TB24: 10.0000 mg | ORAL_TABLET | Freq: Every day | ORAL | 11 refills | Status: DC

## 2022-03-10 MED ORDER — DUTASTERIDE 0.5 MG PO CAPS: 0.5000 mg | ORAL_CAPSULE | Freq: Every day | ORAL | 3 refills | Status: DC

## 2022-03-10 NOTE — Addendum Note (Signed)
Addended by: Cleon Gustin on: 03/10/2022 04:54 PM ? ? Modules accepted: Orders ? ?

## 2022-03-10 NOTE — Progress Notes (Signed)
? ?03/10/2022 ?2:10 PM  ? ?Dustin [name] ?[birth year] ?[id number] ? ?Referring provider: [name], MD ?[address] ?[place],  [place] [address] ? ?Followup BPH and nocturia ? ? ?HPI: ?Dustin [name] is a 88yo here for followup for BPH with nocturia. PVR 1. He is having intermittent dysuria which is worse when his urine is more concentrated.  He denies any gross hematuria. Nocturia 1-2x depending on fluid consumption.  Urine streak strong on uoxatral and avodart. He does have urinary hesitancy. UA today is concerning for infection. No other complaints today ? ? ?PMH: ?Past Medical History:  ?Diagnosis Date  ? Alzheimer disease ([place])   ? Anxiety   ? Arthritis   ? Atrophic gastritis   ? a. By EGD 02/2013.  ? Carotid artery disease ([place])   ? a. mild-mod plaque, <50% stenosis bilat by duplex 2018.  ? Coronary atherosclerosis of native coronary artery   ? a. Multivessel s/p CABG 1996. b. s/p DES x 2 SVG to PDA 8/12 with distal disease managed medically.  ? DDD (degenerative disc disease)   ? Chronic back pain  ? Dementia ([place])   ? Enlarged prostate   ? Essential hypertension   ? Hematuria   ? Hypothyroidism   ? LBBB (left bundle branch block)   ? MI (myocardial infarction) ([place])   ? Mixed hyperlipidemia   ? OA (osteoarthritis)   ? OSA (obstructive sleep apnea)   ? Pneumonia due to COVID-19 virus   ? February 2021  ? Sinus bradycardia   ? a. Aricept and BB discontinued due to this.  ? ? ?Surgical History: ?Past Surgical History:  ?Procedure Laterality Date  ? COLONOSCOPY  08/03/2004  ? Jenkins-numerous large diverticula in the descending, transverse, descending, and sigmoid colon. Otherwise normal exam.  ? COLONOSCOPY  07/12/2012  ? RMR: External hemorrhoidal tag; multiple rectal and colonic polyps removed and/or treated as described above. Pancolonic diverticulosis. Bx-tubular adenomas, rectal hyperplastic polyp. next colonoscopy in 06/2015.  ? COLONOSCOPY N/A 06/22/2016  ? Procedure: COLONOSCOPY;  Surgeon: Aviva Signs,  MD;  [facility];  Service: Gastroenterology;  Laterality: N/A;  730  ? CORONARY ANGIOPLASTY WITH STENT PLACEMENT    ? CORONARY ARTERY BYPASS GRAFT  1996  ? LIMA to LAD, SVG to D2, SVG to PDA, SVG to OM1 and OM2  ? CORONARY STENT INTERVENTION N/A 12/10/2019  ? Procedure: CORONARY STENT INTERVENTION;  Surgeon: Burnell Blanks, MD;  Location: [facility];  Service: Cardiovascular;  Laterality: N/A;  ? ESOPHAGOGASTRODUODENOSCOPY N/A 03/16/2013  ? Procedure: ESOPHAGOGASTRODUODENOSCOPY (EGD);  Surgeon: Daneil Dolin, MD;  [facility];  Service: Endoscopy;  Laterality: N/A;  12:00-moved to [place] notified pt  ? ESOPHAGOGASTRODUODENOSCOPY N/A 03/06/2013  ? Procedure: ESOPHAGOGASTRODUODENOSCOPY (EGD);  Surgeon: Daneil Dolin, MD;  [facility];  Service: Endoscopy;  Laterality: N/A;  ? HERNIA REPAIR    ? INTRAVASCULAR ULTRASOUND/IVUS N/A 12/10/2019  ? Procedure: Intravascular Ultrasound/IVUS;  Surgeon: Burnell Blanks, MD;  Location: [facility];  Service: Cardiovascular;  Laterality: N/A;  ? LEFT HEART CATH AND CORS/GRAFTS ANGIOGRAPHY N/A 12/10/2019  ? Procedure: LEFT HEART CATH AND CORS/GRAFTS ANGIOGRAPHY;  Surgeon: Burnell Blanks, MD;  Location: [facility];  Service: Cardiovascular;  Laterality: N/A;  ? LEFT HEART CATH AND CORS/GRAFTS ANGIOGRAPHY N/A 03/02/2021  ? Procedure: LEFT HEART CATH AND CORS/GRAFTS ANGIOGRAPHY;  Surgeon: Nelva Bush, MD;  Location: [facility];  Service: Cardiovascular;  Laterality: N/A;  ? ? ?  Home Medications:  ?Allergies as of 03/10/2022   ?No Known Allergies ?  ? ?  ?Medication List  ?  ? ?  ? Accurate as of March 10, 2022  2:10 PM. If you have any questions, ask your nurse or doctor.  ?  ?  ? ?  ? ?albuterol (2.5 MG/3ML) 0.083% nebulizer solution ?Commonly known as: PROVENTIL ?Take 3 mLs (2.5 mg total) by nebulization every 6 (six) hours as needed for wheezing or shortness of breath. Patient  will also need the nebulizer machine with this prescription. ?  ?albuterol 108 (90 Base) MCG/ACT inhaler ?Commonly known as: VENTOLIN HFA ?Inhale 2 puffs into the lungs every 6 (six) hours as needed for wheezing or shortness of breath. ?  ?alfuzosin 10 MG 24 hr tablet ?Commonly known as: UROXATRAL ?Take 1 tablet (10 mg total) by mouth daily with breakfast. ?  ?aspirin EC 81 MG tablet ?Take 81 mg by mouth daily. ?  ?Breztri Aerosphere 160-9-4.8 MCG/ACT Aero ?Generic drug: Budeson-Glycopyrrol-Formoterol ?Inhale 2 puffs into the lungs in the morning and at bedtime. ?  ?busPIRone 10 MG tablet ?Commonly known as: BUSPAR ?Take 10 mg by mouth 2 (two) times daily. ?  ?cephALEXin 500 MG capsule ?Commonly known as: KEFLEX ?Take 500 mg by mouth every 6 (six) hours. ?  ?ciprofloxacin 500 MG tablet ?Commonly known as: CIPRO ?Take 500 mg by mouth 2 (two) times daily. ?  ?clopidogrel 75 MG tablet ?Commonly known as: PLAVIX ?Take 1 tablet (75 mg total) by mouth daily. ?  ?doxazosin 1 MG tablet ?Commonly known as: CARDURA ?Take 1 mg by mouth at bedtime. ?  ?dutasteride 0.5 MG capsule ?Commonly known as: AVODART ?Take 1 capsule (0.5 mg total) by mouth daily. In the afternoon ?  ?escitalopram 20 MG tablet ?Commonly known as: LEXAPRO ?Take 20 mg by mouth every morning. ?  ?furosemide 40 MG tablet ?Commonly known as: LASIX ?Take 1 tablet (40 mg total) by mouth in the morning. ?What changed: when to take this ?  ?hydrOXYzine 25 MG tablet ?Commonly known as: ATARAX ?Take 25 mg by mouth 2 (two) times daily as needed for anxiety. ?  ?isosorbide mononitrate 60 MG 24 hr tablet ?Commonly known as: IMDUR ?Take 120 mg by mouth daily. ?  ?levothyroxine 50 MCG tablet ?Commonly known as: SYNTHROID ?Take 50 mcg by mouth every morning. ?  ?levothyroxine 75 MCG tablet ?Commonly known as: SYNTHROID ?Take 75 mcg by mouth daily. ?  ?meclizine 25 MG tablet ?Commonly known as: ANTIVERT ?Take 25 mg by mouth 4 (four) times daily as needed for dizziness. ?   ?memantine 10 MG tablet ?Commonly known as: NAMENDA ?Take 10 mg by mouth 2 (two) times daily. ?  ?Movantik 25 MG Tabs tablet ?Generic drug: naloxegol oxalate ?Take 25 mg by mouth daily. ?  ?mupirocin ointment 2 % ?Commonly known as: BACTROBAN ?Apply 1 application topically 3 (three) times daily as needed (affected areas on legs). ?  ?nitroGLYCERIN 0.4 MG SL tablet ?Commonly known as: NITROSTAT ?Place 1 tablet (0.4 mg total) under the tongue every 5 (five) minutes as needed for chest pain. Pt has bought nitro bottle from home and oxycodone 30 mg ( 3 or 4 tabs) is in nitro bottle per wife Ahmani Prehn. ?What changed: additional instructions ?  ?oxycodone 30 MG immediate release tablet ?Commonly known as: ROXICODONE ?Take 30 mg by mouth every 4 (four) hours as needed. ?  ?pantoprazole 40 MG tablet ?Commonly known as: PROTONIX ?Take 1 tablet (40 mg total) by mouth  every morning. ?  ?potassium chloride SA 20 MEQ tablet ?Commonly known as: KLOR-CON M ?Take 20 mEq by mouth daily. ?  ?QUEtiapine 25 MG tablet ?Commonly known as: SEROQUEL ?Take 25 mg by mouth at bedtime as needed (sleep). ?  ?ranolazine 500 MG 12 hr tablet ?Commonly known as: Ranexa ?Take 1 tablet (500 mg total) by mouth 2 (two) times daily. ?  ?rosuvastatin 40 MG tablet ?Commonly known as: CRESTOR ?Take 1 tablet (40 mg total) by mouth daily at 6 PM. ?  ?tamsulosin 0.4 MG Caps capsule ?Commonly known as: FLOMAX ?Take 0.4 mg by mouth daily. ?  ?triamcinolone cream 0.1 % ?Commonly known as: KENALOG ?Apply 1 application topically 2 (two) times daily as needed (To affected areas on legs). ?  ? ?  ? ? ?Allergies: No Known Allergies ? ?Family History: ?Family History  ?Problem Relation Age of Onset  ? Heart disease Other   ? Heart attack Mother   ? Colon cancer Neg Hx   ? ? ?Social History:  reports that he quit smoking about 27 years ago. His smoking use included cigarettes. He has a 80.00 pack-year smoking history. He has never used smokeless tobacco. He  reports that he does not drink alcohol and does not use drugs. ? ?ROS: ?All other review of systems were reviewed and are negative except what is noted above in HPI ? ?Physical Exam: ?BP 106/61   Pulse 71   ?Co

## 2022-03-10 NOTE — Progress Notes (Signed)
post void residual=1 

## 2022-03-10 NOTE — Patient Instructions (Signed)

## 2022-03-12 LAB — URINE CULTURE: Organism ID, Bacteria: NO GROWTH

## 2022-03-15 DIAGNOSIS — H25812 Combined forms of age-related cataract, left eye: Secondary | ICD-10-CM | POA: Diagnosis not present

## 2022-03-16 NOTE — Progress Notes (Signed)
Letter sent.

## 2022-03-17 ENCOUNTER — Encounter (HOSPITAL_COMMUNITY)
Admission: RE | Admit: 2022-03-17 | Discharge: 2022-03-17 | Disposition: A | Discharge status: Home / Self Care | Payer: Medicare HMO | Source: Ambulatory Visit | Attending: Ophthalmology | Admitting: Ophthalmology

## 2022-03-17 NOTE — H&P (Signed)
Surgical History & Physical ? ?Patient Name: Dustin [name] DOB: Mar 27, 1934 ? ?Surgery: Cataract extraction with intraocular lens implant phacoemulsification; Left Eye ? ?Surgeon: Baruch Goldmann MD ?Surgery Date:  03-22-22 ?Pre-Op Date:  03-15-22 ? ?HPI: ?A 60 Yr. old male patient presents for a cataract evaluation OS, referred by Dr. Coralyn Pear at [facility]. The patient complains of difficulty when driving, which began many years ago. The left eye is affected. The episode is constant and gradual. The condition's severity decreased since last visit. The condition is worse with daily activities. The complaint is associated with blurry vision. The patient states his poor level of vision makes it hard for him to see when walking outside, if there is a sidewalk or a curb, or even stairs he has to be very careful, and usually hold on to something or he will fall, because he cannot see it. The patient also has difficulty driving due to glare from the sun during the day, and from headlights and street lights at night. This is negatively affecting the patient's quality of life and the patient is unable to function adequately in life with the current level of vision. The patient experiences no eye pain. HPI was performed by Baruch Goldmann . ? ?Medical History: ?Macula Degeneration ?Cataracts ?CME OD Retinal Edema OU ERM OD Hypertensive Retinopathy OU Phaco c IOL OD Choroidal Nevus OS ?Macula Degeneration ?Alzheimer, Anxiety, Atrophic gastritis, Deg Disc ,... ?Arthritis ?Heart Problem ?High Blood Pressure ?Thyroid Problems ? ?Review of Systems ?Negative Allergic/Immunologic ?Negative Cardiovascular ?Negative Constitutional ?Negative Ear, Nose, Mouth & Throat ?Negative Endocrine ?Negative Eyes ?Negative Gastrointestinal ?Negative Genitourinary ?Negative Hemotologic/Lymphatic ?Negative Integumentary ?Negative Musculoskeletal ?Negative Neurological ?Negative Psychiatry ?Negative Respiratory ? ?Social ?  Former smoker   ? ?Medication ?Albuterol, Symbicort Inhalant Product, Breztr(Budesonide), Buspirone, Clopidogrel, Doxazosin, Dutasteride, Escitalopram, Ferrous gluconate, Hydroxyzine, Isosorbide Mononitrate, Levothyroxine Sodium, Meclizine, Memantine, Movantik, Mupirocin, Nitroglycerin, Pantoprazole, Potassium, Quetiapine, Ranolazine, Rosuvatatin, Tamsulasin, Kenalog cream, MECLIZINE, OXYCODONE HCL (IR), CEPHALEXIN, ISOSORBIDE MONONIT ER, Levothyroxine, Nitroglycerin, Albuterol sulfate HFA, Breztri Aerosphere, ROSUVASTATIN CALCIUM, DUTASTERIDE, BUSPIRONE HCL, QUETIAPINE FUMARATE, ALFUZOSIN HCL ER, Potassium chloride ER, CLOPIDOGREL,  ? ?Sx/Procedures ?Phaco c IOL OD,  ?Bypass Surgery, Stent sx for heart,  ? ?Drug Allergies  ? NKDA ? ?History & Physical: ?Heent: Cataract, Left Eye ?NECK: supple without bruits ?LUNGS: lungs clear to auscultation ?CV: regular rate and rhythm ?Abdomen: soft and non-tender ? ?Impression & Plan: ?Assessment: ?1.  COMBINED FORMS AGE RELATED CATARACT; Left Eye [phone]) ?2.  BLEPHARITIS; Right Upper Lid, Right Lower Lid, Left Upper Lid, Left Lower Lid (H01.001, H01.002,H01.004,H01.005) ?3.  DERMATOCHALASIS, no surgery; Right Upper Lid, Left Upper Lid (H41.740, C14.481) ?4.  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  Epiretinal Membrane; Right Eye (H35.371) ?6.  PCO; Right Eye (H26.491) ?7.  INTRAOCULAR LENS IOL (Z96.1) ? ?Plan: 1.  Cataract accounts for the patient's decreased vision. This visual impairment is not correctable with a tolerable change in glasses or contact lenses. Cataract surgery with an implantation of a new lens should significantly improve the visual and functional status of the patient. Discussed all risks, benefits, alternatives, and potential complications. Discussed the procedures and recovery. Patient desires to have surgery. A-scan ordered and performed today for intra-ocular lens calculations. The surgery will be performed in order to improve  vision for driving, reading, and for eye examinations. Recommend phacoemulsification with intra-ocular lens. Recommend Dextenza for post-operative pain and inflammation. ?Left Eye. ?Surgery required to correct imbalance of vision. ?Dilates poorly - shugarcaine by protocol. ?Malyugin Ring. ?[place]. ?Request records  from Dr. Herbert Deaner for IOL choice. ? ?2.  Recommend regular lid cleaning. ? ?3.  Asymptomatic, recommend observation for now. Findings, prognosis and treatment options reviewed. ? ?4.  Under the care of Dr. Coralyn Pear ?OCT macula. ? ?5.  Under the care of Dr. Coralyn Pear ? ?6.  Mild. ?Observe. ? ?7.  By Dr. Herbert Deaner in [place] - would like to obtain records with IOL choice for OD. ?

## 2022-03-22 ENCOUNTER — Ambulatory Visit (HOSPITAL_COMMUNITY)
Admission: RE | Admit: 2022-03-22 | Discharge: 2022-03-22 | Disposition: A | Discharge status: Home / Self Care | Payer: Medicare HMO | Source: Ambulatory Visit | Attending: Ophthalmology | Admitting: Ophthalmology

## 2022-03-22 ENCOUNTER — Ambulatory Visit (HOSPITAL_COMMUNITY): Payer: Medicare HMO | Admitting: Certified Registered"

## 2022-03-22 ENCOUNTER — Encounter (HOSPITAL_COMMUNITY)
Admission: RE | Disposition: A | Discharge status: Home / Self Care | Payer: Self-pay | Source: Ambulatory Visit | Attending: Ophthalmology

## 2022-03-22 ENCOUNTER — Encounter (HOSPITAL_COMMUNITY): Payer: Self-pay | Admitting: Ophthalmology

## 2022-03-22 ENCOUNTER — Ambulatory Visit (HOSPITAL_BASED_OUTPATIENT_CLINIC_OR_DEPARTMENT_OTHER): Payer: Medicare HMO | Admitting: Certified Registered"

## 2022-03-22 DIAGNOSIS — H26491 Other secondary cataract, right eye: Secondary | ICD-10-CM | POA: Diagnosis not present

## 2022-03-22 DIAGNOSIS — H02834 Dermatochalasis of left upper eyelid: Secondary | ICD-10-CM | POA: Insufficient documentation

## 2022-03-22 DIAGNOSIS — I2510 Atherosclerotic heart disease of native coronary artery without angina pectoris: Secondary | ICD-10-CM | POA: Diagnosis not present

## 2022-03-22 DIAGNOSIS — H353132 Nonexudative age-related macular degeneration, bilateral, intermediate dry stage: Secondary | ICD-10-CM | POA: Diagnosis not present

## 2022-03-22 DIAGNOSIS — F418 Other specified anxiety disorders: Secondary | ICD-10-CM | POA: Diagnosis not present

## 2022-03-22 DIAGNOSIS — Z87891 Personal history of nicotine dependence: Secondary | ICD-10-CM | POA: Diagnosis not present

## 2022-03-22 DIAGNOSIS — E039 Hypothyroidism, unspecified: Secondary | ICD-10-CM | POA: Insufficient documentation

## 2022-03-22 DIAGNOSIS — J449 Chronic obstructive pulmonary disease, unspecified: Secondary | ICD-10-CM | POA: Diagnosis not present

## 2022-03-22 DIAGNOSIS — H0100B Unspecified blepharitis left eye, upper and lower eyelids: Secondary | ICD-10-CM | POA: Insufficient documentation

## 2022-03-22 DIAGNOSIS — F32A Depression, unspecified: Secondary | ICD-10-CM | POA: Diagnosis not present

## 2022-03-22 DIAGNOSIS — Z961 Presence of intraocular lens: Secondary | ICD-10-CM | POA: Diagnosis not present

## 2022-03-22 DIAGNOSIS — H02831 Dermatochalasis of right upper eyelid: Secondary | ICD-10-CM | POA: Diagnosis not present

## 2022-03-22 DIAGNOSIS — I252 Old myocardial infarction: Secondary | ICD-10-CM | POA: Insufficient documentation

## 2022-03-22 DIAGNOSIS — I509 Heart failure, unspecified: Secondary | ICD-10-CM | POA: Insufficient documentation

## 2022-03-22 DIAGNOSIS — H35371 Puckering of macula, right eye: Secondary | ICD-10-CM | POA: Diagnosis not present

## 2022-03-22 DIAGNOSIS — H0100A Unspecified blepharitis right eye, upper and lower eyelids: Secondary | ICD-10-CM | POA: Diagnosis not present

## 2022-03-22 DIAGNOSIS — H25812 Combined forms of age-related cataract, left eye: Secondary | ICD-10-CM | POA: Diagnosis not present

## 2022-03-22 DIAGNOSIS — H2181 Floppy iris syndrome: Secondary | ICD-10-CM | POA: Insufficient documentation

## 2022-03-22 DIAGNOSIS — I110 Hypertensive heart disease with heart failure: Secondary | ICD-10-CM | POA: Insufficient documentation

## 2022-03-22 DIAGNOSIS — Z951 Presence of aortocoronary bypass graft: Secondary | ICD-10-CM | POA: Insufficient documentation

## 2022-03-22 DIAGNOSIS — G4730 Sleep apnea, unspecified: Secondary | ICD-10-CM | POA: Diagnosis not present

## 2022-03-22 DIAGNOSIS — F419 Anxiety disorder, unspecified: Secondary | ICD-10-CM | POA: Insufficient documentation

## 2022-03-22 HISTORY — PX: CATARACT EXTRACTION W/PHACO: SHX586

## 2022-03-22 SURGERY — PHACOEMULSIFICATION, CATARACT, WITH IOL INSERTION
Anesthesia: Monitor Anesthesia Care | Site: Eye | Laterality: Left

## 2022-03-22 MED ORDER — LIDOCAINE HCL 3.5 % OP GEL
1.0000 "application " | Freq: Once | OPHTHALMIC | Status: AC
  Administered 2022-03-22: 1 via OPHTHALMIC

## 2022-03-22 MED ORDER — TETRACAINE HCL 0.5 % OP SOLN
1.0000 [drp] | OPHTHALMIC | Status: AC | PRN
  Administered 2022-03-22 (×3): 1 [drp] via OPHTHALMIC

## 2022-03-22 MED ORDER — PHENYLEPHRINE-KETOROLAC 1-0.3 % IO SOLN
INTRAOCULAR | Status: DC | PRN
  Administered 2022-03-22: 500 mL via OPHTHALMIC

## 2022-03-22 MED ORDER — POVIDONE-IODINE 5 % OP SOLN
OPHTHALMIC | Status: DC | PRN
  Administered 2022-03-22: 1 via OPHTHALMIC

## 2022-03-22 MED ORDER — EPINEPHRINE PF 1 MG/ML IJ SOLN
INTRAMUSCULAR | Status: AC
  Filled 2022-03-22: qty 1

## 2022-03-22 MED ORDER — LIDOCAINE HCL (PF) 1 % IJ SOLN
INTRAMUSCULAR | Status: DC | PRN
  Administered 2022-03-22: 1 mL via OPHTHALMIC

## 2022-03-22 MED ORDER — BSS IO SOLN
INTRAOCULAR | Status: DC | PRN
  Administered 2022-03-22: 15 mL via INTRAOCULAR

## 2022-03-22 MED ORDER — NEOMYCIN-POLYMYXIN-DEXAMETH 3.5-10000-0.1 OP SUSP
OPHTHALMIC | Status: DC | PRN
  Administered 2022-03-22: 1 [drp] via OPHTHALMIC

## 2022-03-22 MED ORDER — TROPICAMIDE 1 % OP SOLN
1.0000 [drp] | OPHTHALMIC | Status: AC | PRN
  Administered 2022-03-22 (×3): 1 [drp] via OPHTHALMIC

## 2022-03-22 MED ORDER — PHENYLEPHRINE HCL 2.5 % OP SOLN
1.0000 [drp] | OPHTHALMIC | Status: AC | PRN
  Administered 2022-03-22 (×3): 1 [drp] via OPHTHALMIC

## 2022-03-22 MED ORDER — SODIUM HYALURONATE 23MG/ML IO SOSY
PREFILLED_SYRINGE | INTRAOCULAR | Status: DC | PRN
Start: 2022-03-22 — End: 2022-03-22
  Administered 2022-03-22: 0.6 mL via INTRAOCULAR

## 2022-03-22 MED ORDER — SODIUM HYALURONATE 10 MG/ML IO SOLUTION
PREFILLED_SYRINGE | INTRAOCULAR | Status: DC | PRN
  Administered 2022-03-22: 0.85 mL via INTRAOCULAR

## 2022-03-22 MED ORDER — STERILE WATER FOR IRRIGATION IR SOLN
Status: DC | PRN
  Administered 2022-03-22: 250 mL

## 2022-03-22 MED ORDER — PHENYLEPHRINE-KETOROLAC 1-0.3 % IO SOLN
INTRAOCULAR | Status: AC
  Filled 2022-03-22: qty 4

## 2022-03-22 SURGICAL SUPPLY — 16 items
CATARACT SUITE SIGHTPATH (MISCELLANEOUS) ×2 IMPLANT
CLOTH BEACON ORANGE TIMEOUT ST (SAFETY) ×3 IMPLANT
EYE SHIELD UNIVERSAL CLEAR (GAUZE/BANDAGES/DRESSINGS) ×1 IMPLANT
FEE CATARACT SUITE SIGHTPATH (MISCELLANEOUS) ×2 IMPLANT
GLOVE SURG UNDER POLY LF SZ6.5 (GLOVE) IMPLANT
GLOVE SURG UNDER POLY LF SZ7 (GLOVE) ×1 IMPLANT
LENS IOL DIOP 23.0 (Intraocular Lens) ×2 IMPLANT
LENS IOL TECNIS MONO 23.0 (Intraocular Lens) IMPLANT
NDL HYPO 18GX1.5 BLUNT FILL (NEEDLE) ×2 IMPLANT
NEEDLE HYPO 18GX1.5 BLUNT FILL (NEEDLE) ×2 IMPLANT
PAD ARMBOARD 7.5X6 YLW CONV (MISCELLANEOUS) ×3 IMPLANT
RING MALYGIN 7.0 (MISCELLANEOUS) ×1 IMPLANT
SYR TB 1ML LL NO SAFETY (SYRINGE) ×3 IMPLANT
TAPE SURG TRANSPORE 1 IN (GAUZE/BANDAGES/DRESSINGS) IMPLANT
TAPE SURGICAL TRANSPORE 1 IN (GAUZE/BANDAGES/DRESSINGS) ×2
WATER STERILE IRR 250ML POUR (IV SOLUTION) ×3 IMPLANT

## 2022-03-22 NOTE — Discharge Instructions (Signed)
Please discharge patient when stable, will follow up today with Dr. Kadija Cruzen at the [facility] office immediately following discharge.  Leave shield in place until visit.  All paperwork with discharge instructions will be given at the office.  [facility] Address:  [address]  [place], [place] [address]  

## 2022-03-22 NOTE — Interval H&P Note (Signed)
History and Physical Interval Note: ? ?03/22/2022 ?12:32 PM ? ?[name]  has presented today for surgery, with the diagnosis of combined forms age related cataract; left.  The various methods of treatment have been discussed with the patient and family. After consideration of risks, benefits and other options for treatment, the patient has consented to  Procedure(s) with comments: ?CATARACT EXTRACTION PHACO AND INTRAOCULAR LENS PLACEMENT (IOC) (Left) - left as a surgical intervention.  The patient's history has been reviewed, patient examined, no change in status, stable for surgery.  I have reviewed the patient's chart and labs.  Questions were answered to the patient's satisfaction.   ? ? ?Dustin [name] ? ? ?

## 2022-03-22 NOTE — Progress Notes (Signed)
Patient daughter in law Dustin [name] and wife notified that patient states "I took 2 of my nitroglycerin cause I keep them with me".  Patient with wallet in pocket.  ?

## 2022-03-22 NOTE — Op Note (Signed)
Date of procedure: 03/22/22 ? ?Pre-operative diagnosis: Visually significant age-related combined cataract, Left Eye; Poor dilation, Left eye (H25.812)  ? ?Post-operative diagnosis: Visually significant age-related combined cataract, Left Eye; Intra-operative Floppy Iris Syndrome, Left Eye (H21.81) ? ?Procedure: Complex removal of cataract via phacoemulsification and insertion of intra-ocular lens [employer] DCB00 +23.0D into the capsular bag of the Left Eye (CPT [phone]) ? ?Attending surgeon: Gerda Diss. Marisa Hua, MD, MA ? ?Anesthesia: MAC, Topical [place] ? ?Complications: None ? ?Estimated Blood Loss: <70m (minimal) ? ?Specimens: None ? ?Implants: As above ? ?Indications:  Visually significant cataract, Left Eye ? ?Procedure:  ?The patient was seen and identified in the pre-operative area. The operative eye was identified and dilated.  The operative eye was marked.  Topical anesthesia was administered to the operative eye.    ? ?The patient was then to the operative suite and placed in the supine position.  A timeout was performed confirming the patient, procedure to be performed, and all other relevant information.   The patient's face was prepped and draped in the usual fashion for intra-ocular surgery.  A lid speculum was placed into the operative eye and the surgical microscope moved into place and focused.  Poor dilation of the iris was confirmed.  An inferotemporal paracentesis was created using a 20 gauge paracentesis blade.  Shugarcaine was injected into the anterior chamber.  Viscoelastic was injected into the anterior chamber.  A temporal clear-corneal main wound incision was created using a 2.458mmicrokeratome.  A Malyugin ring was placed.  A continuous curvilinear capsulorrhexis was initiated using an irrigating cystitome and completed using capsulorrhexis forceps.  Hydrodissection and hydrodeliniation were performed.  Viscoelastic was injected into the anterior chamber.  A phacoemulsification  handpiece and a chopper as a second instrument were used to remove the nucleus and epinucleus. The irrigation/aspiration handpiece was used to remove any remaining cortical material.  ? ?The capsular bag was reinflated with viscoelastic, checked, and found to be intact.  The intraocular lens was inserted into the capsular bag and dialed into place using a MaSurveyor, mineralsThe Malyugin ring was removed.  The irrigation/aspiration handpiece was used to remove any remaining viscoelastic.  The clear corneal wound and paracentesis wounds were then hydrated and checked with Weck-Cels to be watertight.  The lid-speculum and drape was removed, and the patient's face was cleaned with a wet and dry 4x4.  Maxitrol was instilled in the eye. A clear shield was taped over the eye. The patient was taken to the post-operative care unit in good condition, having tolerated the procedure well. ? ?Post-Op Instructions: The patient will follow [facility] at [facility] a same day post-operative evaluation and will receive all other orders and instructions. ? ?

## 2022-03-22 NOTE — Anesthesia Preprocedure Evaluation (Signed)
Anesthesia Evaluation  ?Patient identified by MRN, date of birth, ID band ?Patient awake ? ? ? ?Reviewed: ?Allergy & Precautions, H&P , NPO status , Patient's Chart, lab work & pertinent test results, reviewed documented beta blocker date and time  ? ?Airway ?Mallampati: II ? ?TM Distance: >3 FB ?Neck ROM: full ? ? ? Dental ?no notable dental hx. ? ?  ?Pulmonary ?sleep apnea , COPD, former smoker,  ?  ?Pulmonary exam normal ?breath sounds clear to auscultation ? ? ? ? ? ? Cardiovascular ?Exercise Tolerance: Good ?hypertension, + CAD, + Past MI, + CABG and +CHF  ? ?Rhythm:regular Rate:Normal ? ? ?  ?Neuro/Psych ?PSYCHIATRIC DISORDERS Anxiety Depression Dementia negative neurological ROS ?   ? GI/Hepatic ?negative GI ROS, Neg liver ROS,   ?Endo/Other  ?Hypothyroidism  ? Renal/GU ?Renal disease  ?negative genitourinary ?  ?Musculoskeletal ? ? Abdominal ?  ?Peds ? Hematology ?negative hematology ROS ?(+)   ?Anesthesia Other Findings ? ? Reproductive/Obstetrics ?negative OB ROS ? ?  ? ? ? ? ? ? ? ? ? ? ? ? ? ?  ?  ? ? ? ? ? ? ? ? ?Anesthesia Physical ?Anesthesia Plan ? ?ASA: 3 ? ?Anesthesia Plan: MAC  ? ?Post-op Pain Management:   ? ?Induction:  ? ?PONV Risk Score and Plan:  ? ?Airway Management Planned:  ? ?Additional Equipment:  ? ?Intra-op Plan:  ? ?Post-operative Plan:  ? ?Informed Consent: I have reviewed the patients History and Physical, chart, labs and discussed the procedure including the risks, benefits and alternatives for the proposed anesthesia with the patient or authorized representative who has indicated his/her understanding and acceptance.  ? ? ? ?Dental Advisory Given ? ?Plan Discussed with: CRNA ? ?Anesthesia Plan Comments:   ? ? ? ? ? ? ?Anesthesia Quick Evaluation ? ?

## 2022-03-22 NOTE — Transfer of Care (Signed)
Immediate Anesthesia Transfer of Care Note ? ?Patient: Dustin [name] ? ?Procedure(s) Performed: CATARACT EXTRACTION PHACO AND INTRAOCULAR LENS PLACEMENT (IOC) (Left: Eye) ? ?Patient Location: Short Stay ? ?Anesthesia Type:MAC ? ?Level of Consciousness: awake and alert  ? ?Airway & Oxygen Therapy: Patient Spontanous Breathing ? ?Post-op Assessment: Report given to RN and Post -op Vital signs reviewed and stable ? ?Post vital signs: Reviewed and stable ? ?Last Vitals:  ?Vitals Value Taken Time  ?BP    ?Temp    ?Pulse    ?Resp    ?SpO2    ? ? ?Last Pain:  ?Vitals:  ? 03/22/22 1234  ?TempSrc: Tympanic  ?PainSc: 0-No pain  ?   ? ?  ? ?Complications: No notable events documented. ?

## 2022-03-22 NOTE — Anesthesia Procedure Notes (Signed)
Procedure Name: [place] ?Date/Time: 03/22/2022 12:46 PM ?Performed by: Orlie Dakin, CRNA ?Pre-anesthesia Checklist: Patient identified, Emergency Drugs available, Suction available and Patient being monitored ?Patient Re-evaluated:Patient Re-evaluated prior to induction ?Oxygen Delivery Method: Nasal cannula ?Placement Confirmation: positive ETCO2 ? ? ? ? ?

## 2022-03-23 NOTE — Anesthesia Postprocedure Evaluation (Signed)
Anesthesia Post Note ? ?Patient: Dustin [name] ? ?Procedure(s) Performed: CATARACT EXTRACTION PHACO AND INTRAOCULAR LENS PLACEMENT (IOC) (Left: Eye) ? ?Patient location during evaluation: Phase II ?Anesthesia Type: MAC ?Level of consciousness: awake ?Pain management: pain level controlled ?Vital Signs Assessment: post-procedure vital signs reviewed and stable ?Respiratory status: spontaneous breathing and respiratory function stable ?Cardiovascular status: blood pressure returned to baseline and stable ?Postop Assessment: no headache and no apparent nausea or vomiting ?Anesthetic complications: no ?Comments: Late entry ? ? ?No notable events documented. ? ? ?Last Vitals:  ?Vitals:  ? 03/22/22 1234 03/22/22 1306  ?BP: 103/73 105/70  ?Pulse: (!) 59 60  ?Resp: 12 16  ?Temp: 37.1 ?C 36.9 ?C  ?SpO2:  94%  ?  ?Last Pain:  ?Vitals:  ? 03/22/22 1306  ?TempSrc: Axillary  ?PainSc: 0-No pain  ? ? ?  ?  ?  ?  ?  ?  ? ?Louann Sjogren ? ? ? ? ?

## 2022-03-24 ENCOUNTER — Encounter (HOSPITAL_COMMUNITY): Payer: Self-pay | Admitting: Ophthalmology

## 2022-03-26 DIAGNOSIS — E782 Mixed hyperlipidemia: Secondary | ICD-10-CM | POA: Diagnosis not present

## 2022-03-26 DIAGNOSIS — J449 Chronic obstructive pulmonary disease, unspecified: Secondary | ICD-10-CM | POA: Diagnosis not present

## 2022-03-26 DIAGNOSIS — I10 Essential (primary) hypertension: Secondary | ICD-10-CM | POA: Diagnosis not present

## 2022-03-26 NOTE — Progress Notes (Shared)
?[name] Note ? ?03/29/2022 ? ?  ? ?CHIEF COMPLAINT ?Patient presents for No chief complaint on file. ? ? ? ?HISTORY OF PRESENT ILLNESS: ?Dustin [name] is a 88 y.o. male who presents to the clinic today for:  ? ? ?Pt feels like he cannot see as well as at last visit.  F/u delayed due to illness.  Has difficulty especially seeing at near. ? ?HISTORICAL INFORMATION:  ? ?Selected notes from the [place] ?Pt of Dr. Zigmund Daniel ?LEE: 06.29.20 ?BCVA: OD 20/20, OS 20/30 ?Ocular Hx- CME OD, EMR OD, HTN Ret. OU, Choroidal Nevus OS, NS OS, ARMD OS, Pseudo OD  ? ?CURRENT MEDICATIONS: ?No current outpatient medications on file. (Ophthalmic Drugs)  ? ?No current facility-administered medications for this visit. (Ophthalmic Drugs)  ? ?Current Outpatient Medications (Other)  ?Medication Sig  ? albuterol (PROVENTIL) (2.5 MG/3ML) 0.083% nebulizer solution Take 3 mLs (2.5 mg total) by nebulization every 6 (six) hours as needed for wheezing or shortness of breath. Patient will also need the nebulizer machine with this prescription.  ? albuterol (VENTOLIN HFA) 108 (90 Base) MCG/ACT inhaler Inhale 2 puffs into the lungs every 6 (six) hours as needed for wheezing or shortness of breath.  ? alfuzosin (UROXATRAL) 10 MG 24 hr tablet Take 1 tablet (10 mg total) by mouth daily with breakfast.  ? aspirin EC 81 MG tablet Take 81 mg by mouth daily.  ? Budeson-Glycopyrrol-Formoterol (BREZTRI AEROSPHERE) 160-9-4.8 MCG/ACT AERO Inhale 2 puffs into the lungs in the morning and at bedtime.  ? busPIRone (BUSPAR) 10 MG tablet Take 10 mg by mouth 2 (two) times daily.  ? cephALEXin (KEFLEX) 500 MG capsule Take 500 mg by mouth every 6 (six) hours.  ? ciprofloxacin (CIPRO) 500 MG tablet Take 500 mg by mouth 2 (two) times daily.  ? clopidogrel (PLAVIX) 75 MG tablet Take 1 tablet (75 mg total) by mouth daily.  ? doxazosin (CARDURA) 1 MG tablet Take 1 mg by mouth at bedtime.  ? dutasteride (AVODART) 0.5 MG capsule  Take 1 capsule (0.5 mg total) by mouth daily. In the afternoon  ? escitalopram (LEXAPRO) 20 MG tablet Take 20 mg by mouth every morning.   ? furosemide (LASIX) 40 MG tablet Take 1 tablet (40 mg total) by mouth in the morning. (Patient taking differently: Take 40 mg by mouth daily.)  ? hydrOXYzine (ATARAX/VISTARIL) 25 MG tablet Take 25 mg by mouth 2 (two) times daily as needed for anxiety.  ? isosorbide mononitrate (IMDUR) 60 MG 24 hr tablet Take 120 mg by mouth daily.  ? levothyroxine (SYNTHROID) 75 MCG tablet Take 75 mcg by mouth daily.  ? levothyroxine (SYNTHROID, LEVOTHROID) 50 MCG tablet Take 50 mcg by mouth every morning.  ? meclizine (ANTIVERT) 25 MG tablet Take 25 mg by mouth 4 (four) times daily as needed for dizziness.  ? memantine (NAMENDA) 10 MG tablet Take 10 mg by mouth 2 (two) times daily.   ? MOVANTIK 25 MG TABS tablet Take 25 mg by mouth daily.  ? mupirocin ointment (BACTROBAN) 2 % Apply 1 application topically 3 (three) times daily as needed (affected areas on legs).  ? nitroGLYCERIN (NITROSTAT) 0.4 MG SL tablet Place 1 tablet (0.4 mg total) under the tongue every 5 (five) minutes as needed for chest pain. Pt has bought nitro bottle from home and oxycodone 30 mg ( 3 or 4 tabs) is in nitro bottle per wife Dustin [name]. (Patient taking differently: Place 0.4 mg under  the tongue every 5 (five) minutes as needed for chest pain.)  ? oxycodone (ROXICODONE) 30 MG immediate release tablet Take 30 mg by mouth every 4 (four) hours as needed.  ? pantoprazole (PROTONIX) 40 MG tablet Take 1 tablet (40 mg total) by mouth every morning.  ? potassium chloride SA (K-DUR,KLOR-CON) 20 MEQ tablet Take 20 mEq by mouth daily.   ? QUEtiapine (SEROQUEL) 25 MG tablet Take 25 mg by mouth at bedtime as needed (sleep).  ? ranolazine (RANEXA) 500 MG 12 hr tablet Take 1 tablet (500 mg total) by mouth 2 (two) times daily.  ? rosuvastatin (CRESTOR) 40 MG tablet Take 1 tablet (40 mg total) by mouth daily at 6 PM.  ?  sulfamethoxazole-trimethoprim (BACTRIM DS) 800-160 MG tablet Take 1 tablet by mouth every 12 (twelve) hours.  ? tamsulosin (FLOMAX) 0.4 MG CAPS capsule Take 0.4 mg by mouth daily.  ? triamcinolone cream (KENALOG) 0.1 % Apply 1 application topically 2 (two) times daily as needed (To affected areas on legs).  ? ?No current facility-administered medications for this visit. (Other)  ? ? ?REVIEW OF SYSTEMS: ? ? ? ?ALLERGIES ?No Known Allergies ? ?PAST MEDICAL HISTORY ?Past Medical History:  ?Diagnosis Date  ? Alzheimer disease ([place])   ? Anxiety   ? Arthritis   ? Atrophic gastritis   ? a. By EGD 02/2013.  ? Carotid artery disease ([place])   ? a. mild-mod plaque, <50% stenosis bilat by duplex 2018.  ? Coronary atherosclerosis of native coronary artery   ? a. Multivessel s/p CABG 1996. b. s/p DES x 2 SVG to PDA 8/12 with distal disease managed medically.  ? DDD (degenerative disc disease)   ? Chronic back pain  ? Dementia ([place])   ? Enlarged prostate   ? Essential hypertension   ? Hematuria   ? Hypothyroidism   ? LBBB (left bundle branch block)   ? MI (myocardial infarction) ([place])   ? Mixed hyperlipidemia   ? OA (osteoarthritis)   ? OSA (obstructive sleep apnea)   ? Pneumonia due to COVID-19 virus   ? February 2021  ? Sinus bradycardia   ? a. Aricept and BB discontinued due to this.  ? ?Past Surgical History:  ?Procedure Laterality Date  ? CATARACT EXTRACTION W/PHACO Left 03/22/2022  ? Procedure: CATARACT EXTRACTION PHACO AND INTRAOCULAR LENS PLACEMENT (IOC);  Surgeon: Baruch Goldmann, MD;  Location: [facility];  Service: Ophthalmology;  Laterality: Left;  CDE: 25.22  ? COLONOSCOPY  08/03/2004  ? Jenkins-numerous large diverticula in the descending, transverse, descending, and sigmoid colon. Otherwise normal exam.  ? COLONOSCOPY  07/12/2012  ? RMR: External hemorrhoidal tag; multiple rectal and colonic polyps removed and/or treated as described above. Pancolonic diverticulosis. Bx-tubular adenomas, rectal hyperplastic polyp. next  colonoscopy in 06/2015.  ? COLONOSCOPY N/A 06/22/2016  ? Procedure: COLONOSCOPY;  Surgeon: Aviva Signs, MD;  [facility];  Service: Gastroenterology;  Laterality: N/A;  730  ? CORONARY ANGIOPLASTY WITH STENT PLACEMENT    ? CORONARY ARTERY BYPASS GRAFT  1996  ? LIMA to LAD, SVG to D2, SVG to PDA, SVG to OM1 and OM2  ? CORONARY STENT INTERVENTION N/A 12/10/2019  ? Procedure: CORONARY STENT INTERVENTION;  Surgeon: Burnell Blanks, MD;  Location: [place] [facility];  Service: Cardiovascular;  Laterality: N/A;  ? ESOPHAGOGASTRODUODENOSCOPY N/A 03/16/2013  ? Procedure: ESOPHAGOGASTRODUODENOSCOPY (EGD);  Surgeon: Daneil Dolin, MD;  [facility];  Service: Endoscopy;  Laterality: N/A;  12:00-moved to [place] notified  pt  ? ESOPHAGOGASTRODUODENOSCOPY N/A 03/06/2013  ? Procedure: ESOPHAGOGASTRODUODENOSCOPY (EGD);  Surgeon: Daneil Dolin, MD;  [facility];  Service: Endoscopy;  Laterality: N/A;  ? HERNIA REPAIR    ? INTRAVASCULAR ULTRASOUND/IVUS N/A 12/10/2019  ? Procedure: Intravascular Ultrasound/IVUS;  Surgeon: Burnell Blanks, MD;  Location: [place] [facility];  Service: Cardiovascular;  Laterality: N/A;  ? LEFT HEART CATH AND CORS/GRAFTS ANGIOGRAPHY N/A 12/10/2019  ? Procedure: LEFT HEART CATH AND CORS/GRAFTS ANGIOGRAPHY;  Surgeon: Burnell Blanks, MD;  Location: [place] [facility];  Service: Cardiovascular;  Laterality: N/A;  ? LEFT HEART CATH AND CORS/GRAFTS ANGIOGRAPHY N/A 03/02/2021  ? Procedure: LEFT HEART CATH AND CORS/GRAFTS ANGIOGRAPHY;  Surgeon: Nelva Bush, MD;  Location: [place] [facility];  Service: Cardiovascular;  Laterality: N/A;  ? ? ?FAMILY HISTORY ?Family History  ?Problem Relation Age of Onset  ? Heart disease Other   ? Heart attack Mother   ? Colon cancer Neg Hx   ? ? ?SOCIAL HISTORY ?Social History  ? ?Tobacco Use  ? Smoking status: Former  ?  Packs/day: 2.00  ?  Years: 40.00  ?  Pack years: 80.00  ?  Types: Cigarettes  ?  Quit date:  12/27/1994  ?  Years since quitting: 27.2  ? Smokeless tobacco: Never  ?Vaping Use  ? Vaping Use: Never used  ?Substance Use Topics  ? Alcohol use: No  ?  Alcohol/week: 0.0 standard drinks  ? Drug use: No  ?  ? ?  ?OPHTHALMI

## 2022-03-29 ENCOUNTER — Encounter (INDEPENDENT_AMBULATORY_CARE_PROVIDER_SITE_OTHER): Payer: Medicare HMO | Admitting: Ophthalmology

## 2022-03-29 DIAGNOSIS — D3132 Benign neoplasm of left choroid: Secondary | ICD-10-CM

## 2022-03-29 DIAGNOSIS — H35033 Hypertensive retinopathy, bilateral: Secondary | ICD-10-CM

## 2022-03-29 DIAGNOSIS — I10 Essential (primary) hypertension: Secondary | ICD-10-CM

## 2022-03-29 DIAGNOSIS — H35371 Puckering of macula, right eye: Secondary | ICD-10-CM

## 2022-03-29 DIAGNOSIS — H35351 Cystoid macular degeneration, right eye: Secondary | ICD-10-CM

## 2022-03-29 DIAGNOSIS — H25812 Combined forms of age-related cataract, left eye: Secondary | ICD-10-CM

## 2022-03-29 DIAGNOSIS — H353122 Nonexudative age-related macular degeneration, left eye, intermediate dry stage: Secondary | ICD-10-CM

## 2022-03-29 DIAGNOSIS — Z961 Presence of intraocular lens: Secondary | ICD-10-CM

## 2022-03-29 NOTE — Progress Notes (Shared)
?[name] Note ? ?03/30/2022 ? ?  ? ?CHIEF COMPLAINT ?Patient presents for No chief complaint on file. ? ? ? ?HISTORY OF PRESENT ILLNESS: ?Dustin [name] is a 88 y.o. male who presents to the clinic today for:  ? ? ?Pt feels like he cannot see as well as at last visit.  F/u delayed due to illness.  Has difficulty especially seeing at near. ? ?HISTORICAL INFORMATION:  ? ?Selected notes from the [place] ?Pt of Dr. Zigmund Daniel ?LEE: 06.29.20 ?BCVA: OD 20/20, OS 20/30 ?Ocular Hx- CME OD, EMR OD, HTN Ret. OU, Choroidal Nevus OS, NS OS, ARMD OS, Pseudo OD  ? ?CURRENT MEDICATIONS: ?No current outpatient medications on file. (Ophthalmic Drugs)  ? ?No current facility-administered medications for this visit. (Ophthalmic Drugs)  ? ?Current Outpatient Medications (Other)  ?Medication Sig  ? albuterol (PROVENTIL) (2.5 MG/3ML) 0.083% nebulizer solution Take 3 mLs (2.5 mg total) by nebulization every 6 (six) hours as needed for wheezing or shortness of breath. Patient will also need the nebulizer machine with this prescription.  ? albuterol (VENTOLIN HFA) 108 (90 Base) MCG/ACT inhaler Inhale 2 puffs into the lungs every 6 (six) hours as needed for wheezing or shortness of breath.  ? alfuzosin (UROXATRAL) 10 MG 24 hr tablet Take 1 tablet (10 mg total) by mouth daily with breakfast.  ? aspirin EC 81 MG tablet Take 81 mg by mouth daily.  ? Budeson-Glycopyrrol-Formoterol (BREZTRI AEROSPHERE) 160-9-4.8 MCG/ACT AERO Inhale 2 puffs into the lungs in the morning and at bedtime.  ? busPIRone (BUSPAR) 10 MG tablet Take 10 mg by mouth 2 (two) times daily.  ? cephALEXin (KEFLEX) 500 MG capsule Take 500 mg by mouth every 6 (six) hours.  ? ciprofloxacin (CIPRO) 500 MG tablet Take 500 mg by mouth 2 (two) times daily.  ? clopidogrel (PLAVIX) 75 MG tablet Take 1 tablet (75 mg total) by mouth daily.  ? doxazosin (CARDURA) 1 MG tablet Take 1 mg by mouth at bedtime.  ? dutasteride (AVODART) 0.5 MG capsule  Take 1 capsule (0.5 mg total) by mouth daily. In the afternoon  ? escitalopram (LEXAPRO) 20 MG tablet Take 20 mg by mouth every morning.   ? furosemide (LASIX) 40 MG tablet Take 1 tablet (40 mg total) by mouth in the morning. (Patient taking differently: Take 40 mg by mouth daily.)  ? hydrOXYzine (ATARAX/VISTARIL) 25 MG tablet Take 25 mg by mouth 2 (two) times daily as needed for anxiety.  ? isosorbide mononitrate (IMDUR) 60 MG 24 hr tablet Take 120 mg by mouth daily.  ? levothyroxine (SYNTHROID) 75 MCG tablet Take 75 mcg by mouth daily.  ? levothyroxine (SYNTHROID, LEVOTHROID) 50 MCG tablet Take 50 mcg by mouth every morning.  ? meclizine (ANTIVERT) 25 MG tablet Take 25 mg by mouth 4 (four) times daily as needed for dizziness.  ? memantine (NAMENDA) 10 MG tablet Take 10 mg by mouth 2 (two) times daily.   ? MOVANTIK 25 MG TABS tablet Take 25 mg by mouth daily.  ? mupirocin ointment (BACTROBAN) 2 % Apply 1 application topically 3 (three) times daily as needed (affected areas on legs).  ? nitroGLYCERIN (NITROSTAT) 0.4 MG SL tablet Place 1 tablet (0.4 mg total) under the tongue every 5 (five) minutes as needed for chest pain. Pt has bought nitro bottle from home and oxycodone 30 mg ( 3 or 4 tabs) is in nitro bottle per wife Cylus [place]. (Patient taking differently: Place 0.4 mg under  the tongue every 5 (five) minutes as needed for chest pain.)  ? oxycodone (ROXICODONE) 30 MG immediate release tablet Take 30 mg by mouth every 4 (four) hours as needed.  ? pantoprazole (PROTONIX) 40 MG tablet Take 1 tablet (40 mg total) by mouth every morning.  ? potassium chloride SA (K-DUR,KLOR-CON) 20 MEQ tablet Take 20 mEq by mouth daily.   ? QUEtiapine (SEROQUEL) 25 MG tablet Take 25 mg by mouth at bedtime as needed (sleep).  ? ranolazine (RANEXA) 500 MG 12 hr tablet Take 1 tablet (500 mg total) by mouth 2 (two) times daily.  ? rosuvastatin (CRESTOR) 40 MG tablet Take 1 tablet (40 mg total) by mouth daily at 6 PM.  ?  sulfamethoxazole-trimethoprim (BACTRIM DS) 800-160 MG tablet Take 1 tablet by mouth every 12 (twelve) hours.  ? tamsulosin (FLOMAX) 0.4 MG CAPS capsule Take 0.4 mg by mouth daily.  ? triamcinolone cream (KENALOG) 0.1 % Apply 1 application topically 2 (two) times daily as needed (To affected areas on legs).  ? ?No current facility-administered medications for this visit. (Other)  ? ? ?REVIEW OF SYSTEMS: ? ? ? ?ALLERGIES ?No Known Allergies ? ?PAST MEDICAL HISTORY ?Past Medical History:  ?Diagnosis Date  ? Alzheimer disease ([place])   ? Anxiety   ? Arthritis   ? Atrophic gastritis   ? a. By EGD 02/2013.  ? Carotid artery disease ([place])   ? a. mild-mod plaque, <50% stenosis bilat by duplex 2018.  ? Coronary atherosclerosis of native coronary artery   ? a. Multivessel s/p CABG 1996. b. s/p DES x 2 SVG to PDA 8/12 with distal disease managed medically.  ? DDD (degenerative disc disease)   ? Chronic back pain  ? Dementia ([place])   ? Enlarged prostate   ? Essential hypertension   ? Hematuria   ? Hypothyroidism   ? LBBB (left bundle branch block)   ? MI (myocardial infarction) ([place])   ? Mixed hyperlipidemia   ? OA (osteoarthritis)   ? OSA (obstructive sleep apnea)   ? Pneumonia due to COVID-19 virus   ? February 2021  ? Sinus bradycardia   ? a. Aricept and BB discontinued due to this.  ? ?Past Surgical History:  ?Procedure Laterality Date  ? CATARACT EXTRACTION W/PHACO Left 03/22/2022  ? Procedure: CATARACT EXTRACTION PHACO AND INTRAOCULAR LENS PLACEMENT (IOC);  Surgeon: Baruch Goldmann, MD;  Location: [facility];  Service: Ophthalmology;  Laterality: Left;  CDE: 25.22  ? COLONOSCOPY  08/03/2004  ? Jenkins-numerous large diverticula in the descending, transverse, descending, and sigmoid colon. Otherwise normal exam.  ? COLONOSCOPY  07/12/2012  ? RMR: External hemorrhoidal tag; multiple rectal and colonic polyps removed and/or treated as described above. Pancolonic diverticulosis. Bx-tubular adenomas, rectal hyperplastic polyp. next  colonoscopy in 06/2015.  ? COLONOSCOPY N/A 06/22/2016  ? Procedure: COLONOSCOPY;  Surgeon: Aviva Signs, MD;  [facility];  Service: Gastroenterology;  Laterality: N/A;  730  ? CORONARY ANGIOPLASTY WITH STENT PLACEMENT    ? CORONARY ARTERY BYPASS GRAFT  1996  ? LIMA to LAD, SVG to D2, SVG to PDA, SVG to OM1 and OM2  ? CORONARY STENT INTERVENTION N/A 12/10/2019  ? Procedure: CORONARY STENT INTERVENTION;  Surgeon: Burnell Blanks, MD;  Location: [place] [facility];  Service: Cardiovascular;  Laterality: N/A;  ? ESOPHAGOGASTRODUODENOSCOPY N/A 03/16/2013  ? Procedure: ESOPHAGOGASTRODUODENOSCOPY (EGD);  Surgeon: Daneil Dolin, MD;  [facility];  Service: Endoscopy;  Laterality: N/A;  12:00-moved to [place] notified  pt  ? ESOPHAGOGASTRODUODENOSCOPY N/A 03/06/2013  ? Procedure: ESOPHAGOGASTRODUODENOSCOPY (EGD);  Surgeon: Daneil Dolin, MD;  [facility];  Service: Endoscopy;  Laterality: N/A;  ? HERNIA REPAIR    ? INTRAVASCULAR ULTRASOUND/IVUS N/A 12/10/2019  ? Procedure: Intravascular Ultrasound/IVUS;  Surgeon: Burnell Blanks, MD;  Location: [place] [facility];  Service: Cardiovascular;  Laterality: N/A;  ? LEFT HEART CATH AND CORS/GRAFTS ANGIOGRAPHY N/A 12/10/2019  ? Procedure: LEFT HEART CATH AND CORS/GRAFTS ANGIOGRAPHY;  Surgeon: Burnell Blanks, MD;  Location: [place] [facility];  Service: Cardiovascular;  Laterality: N/A;  ? LEFT HEART CATH AND CORS/GRAFTS ANGIOGRAPHY N/A 03/02/2021  ? Procedure: LEFT HEART CATH AND CORS/GRAFTS ANGIOGRAPHY;  Surgeon: Nelva Bush, MD;  Location: [place] [facility];  Service: Cardiovascular;  Laterality: N/A;  ? ? ?FAMILY HISTORY ?Family History  ?Problem Relation Age of Onset  ? Heart disease Other   ? Heart attack Mother   ? Colon cancer Neg Hx   ? ? ?SOCIAL HISTORY ?Social History  ? ?Tobacco Use  ? Smoking status: Former  ?  Packs/day: 2.00  ?  Years: 40.00  ?  Pack years: 80.00  ?  Types: Cigarettes  ?  Quit date:  12/27/1994  ?  Years since quitting: 27.2  ? Smokeless tobacco: Never  ?Vaping Use  ? Vaping Use: Never used  ?Substance Use Topics  ? Alcohol use: No  ?  Alcohol/week: 0.0 standard drinks  ? Drug use: No  ?  ? ?  ?OPHTHALMI

## 2022-03-30 ENCOUNTER — Encounter (INDEPENDENT_AMBULATORY_CARE_PROVIDER_SITE_OTHER): Payer: Medicare HMO | Admitting: Ophthalmology

## 2022-03-30 DIAGNOSIS — H35033 Hypertensive retinopathy, bilateral: Secondary | ICD-10-CM

## 2022-03-30 DIAGNOSIS — H353122 Nonexudative age-related macular degeneration, left eye, intermediate dry stage: Secondary | ICD-10-CM

## 2022-03-30 DIAGNOSIS — D3132 Benign neoplasm of left choroid: Secondary | ICD-10-CM

## 2022-03-30 DIAGNOSIS — I10 Essential (primary) hypertension: Secondary | ICD-10-CM

## 2022-03-30 DIAGNOSIS — H35371 Puckering of macula, right eye: Secondary | ICD-10-CM

## 2022-03-30 DIAGNOSIS — Z961 Presence of intraocular lens: Secondary | ICD-10-CM

## 2022-03-30 DIAGNOSIS — H35351 Cystoid macular degeneration, right eye: Secondary | ICD-10-CM

## 2022-03-30 DIAGNOSIS — H25812 Combined forms of age-related cataract, left eye: Secondary | ICD-10-CM

## 2022-04-12 ENCOUNTER — Encounter (INDEPENDENT_AMBULATORY_CARE_PROVIDER_SITE_OTHER): Payer: Medicare HMO | Admitting: Ophthalmology

## 2022-04-12 DIAGNOSIS — I10 Essential (primary) hypertension: Secondary | ICD-10-CM | POA: Diagnosis not present

## 2022-04-12 DIAGNOSIS — Z6830 Body mass index (BMI) 30.0-30.9, adult: Secondary | ICD-10-CM | POA: Diagnosis not present

## 2022-04-12 DIAGNOSIS — G894 Chronic pain syndrome: Secondary | ICD-10-CM | POA: Diagnosis not present

## 2022-04-12 DIAGNOSIS — N401 Enlarged prostate with lower urinary tract symptoms: Secondary | ICD-10-CM | POA: Diagnosis not present

## 2022-04-12 DIAGNOSIS — J449 Chronic obstructive pulmonary disease, unspecified: Secondary | ICD-10-CM | POA: Diagnosis not present

## 2022-04-12 DIAGNOSIS — M1991 Primary osteoarthritis, unspecified site: Secondary | ICD-10-CM | POA: Diagnosis not present

## 2022-04-12 DIAGNOSIS — E6609 Other obesity due to excess calories: Secondary | ICD-10-CM | POA: Diagnosis not present

## 2022-04-13 ENCOUNTER — Other Ambulatory Visit (HOSPITAL_COMMUNITY): Payer: Medicare HMO

## 2022-04-14 ENCOUNTER — Other Ambulatory Visit: Payer: Self-pay

## 2022-04-14 ENCOUNTER — Emergency Department (HOSPITAL_COMMUNITY)
Admission: EM | Admit: 2022-04-14 | Discharge: 2022-04-14 | Discharge status: Against Medical Advice | Payer: Medicare HMO | Attending: Emergency Medicine | Admitting: Emergency Medicine

## 2022-04-14 ENCOUNTER — Encounter (HOSPITAL_COMMUNITY): Payer: Self-pay | Admitting: Emergency Medicine

## 2022-04-14 DIAGNOSIS — Z5321 Procedure and treatment not carried out due to patient leaving prior to being seen by health care provider: Secondary | ICD-10-CM | POA: Diagnosis not present

## 2022-04-14 DIAGNOSIS — L299 Pruritus, unspecified: Secondary | ICD-10-CM | POA: Insufficient documentation

## 2022-04-14 NOTE — ED Triage Notes (Signed)
Pt endorses bilateral leg itching for 1 week. Denies any pain. Itching is to back of thighs, small rash noted. Pt worried about having shingles. He is unsure if he has had the shingles vaccine.  ?

## 2022-04-25 DIAGNOSIS — J449 Chronic obstructive pulmonary disease, unspecified: Secondary | ICD-10-CM | POA: Diagnosis not present

## 2022-04-25 DIAGNOSIS — E782 Mixed hyperlipidemia: Secondary | ICD-10-CM | POA: Diagnosis not present

## 2022-04-25 DIAGNOSIS — I10 Essential (primary) hypertension: Secondary | ICD-10-CM | POA: Diagnosis not present

## 2022-05-12 DIAGNOSIS — N419 Inflammatory disease of prostate, unspecified: Secondary | ICD-10-CM | POA: Diagnosis not present

## 2022-05-12 DIAGNOSIS — D649 Anemia, unspecified: Secondary | ICD-10-CM | POA: Diagnosis not present

## 2022-05-12 DIAGNOSIS — I209 Angina pectoris, unspecified: Secondary | ICD-10-CM | POA: Diagnosis not present

## 2022-05-12 DIAGNOSIS — J449 Chronic obstructive pulmonary disease, unspecified: Secondary | ICD-10-CM | POA: Diagnosis not present

## 2022-05-12 DIAGNOSIS — F0280 Dementia in other diseases classified elsewhere without behavioral disturbance: Secondary | ICD-10-CM | POA: Diagnosis not present

## 2022-05-12 DIAGNOSIS — G309 Alzheimer's disease, unspecified: Secondary | ICD-10-CM | POA: Diagnosis not present

## 2022-05-12 DIAGNOSIS — R300 Dysuria: Secondary | ICD-10-CM | POA: Diagnosis not present

## 2022-05-12 DIAGNOSIS — E6609 Other obesity due to excess calories: Secondary | ICD-10-CM | POA: Diagnosis not present

## 2022-05-12 DIAGNOSIS — Z6830 Body mass index (BMI) 30.0-30.9, adult: Secondary | ICD-10-CM | POA: Diagnosis not present

## 2022-05-31 DIAGNOSIS — I788 Other diseases of capillaries: Secondary | ICD-10-CM | POA: Diagnosis not present

## 2022-05-31 DIAGNOSIS — L820 Inflamed seborrheic keratosis: Secondary | ICD-10-CM | POA: Diagnosis not present

## 2022-06-11 DIAGNOSIS — Z6830 Body mass index (BMI) 30.0-30.9, adult: Secondary | ICD-10-CM | POA: Diagnosis not present

## 2022-06-11 DIAGNOSIS — G894 Chronic pain syndrome: Secondary | ICD-10-CM | POA: Diagnosis not present

## 2022-06-11 DIAGNOSIS — M1990 Unspecified osteoarthritis, unspecified site: Secondary | ICD-10-CM | POA: Diagnosis not present

## 2022-06-11 DIAGNOSIS — F0280 Dementia in other diseases classified elsewhere without behavioral disturbance: Secondary | ICD-10-CM | POA: Diagnosis not present

## 2022-06-11 DIAGNOSIS — J449 Chronic obstructive pulmonary disease, unspecified: Secondary | ICD-10-CM | POA: Diagnosis not present

## 2022-06-11 DIAGNOSIS — I700 Atherosclerosis of aorta: Secondary | ICD-10-CM | POA: Diagnosis not present

## 2022-06-11 DIAGNOSIS — E063 Autoimmune thyroiditis: Secondary | ICD-10-CM | POA: Diagnosis not present

## 2022-06-11 DIAGNOSIS — N400 Enlarged prostate without lower urinary tract symptoms: Secondary | ICD-10-CM | POA: Diagnosis not present

## 2022-06-11 DIAGNOSIS — M48061 Spinal stenosis, lumbar region without neurogenic claudication: Secondary | ICD-10-CM | POA: Diagnosis not present

## 2022-06-12 ENCOUNTER — Other Ambulatory Visit: Payer: Self-pay | Admitting: Pulmonary Disease

## 2022-06-25 DIAGNOSIS — E782 Mixed hyperlipidemia: Secondary | ICD-10-CM | POA: Diagnosis not present

## 2022-06-25 DIAGNOSIS — I10 Essential (primary) hypertension: Secondary | ICD-10-CM | POA: Diagnosis not present

## 2022-06-25 DIAGNOSIS — J449 Chronic obstructive pulmonary disease, unspecified: Secondary | ICD-10-CM | POA: Diagnosis not present

## 2022-07-08 DIAGNOSIS — M1990 Unspecified osteoarthritis, unspecified site: Secondary | ICD-10-CM | POA: Diagnosis not present

## 2022-07-08 DIAGNOSIS — J40 Bronchitis, not specified as acute or chronic: Secondary | ICD-10-CM | POA: Diagnosis not present

## 2022-07-08 DIAGNOSIS — J441 Chronic obstructive pulmonary disease with (acute) exacerbation: Secondary | ICD-10-CM | POA: Diagnosis not present

## 2022-07-08 DIAGNOSIS — G894 Chronic pain syndrome: Secondary | ICD-10-CM | POA: Diagnosis not present

## 2022-07-08 DIAGNOSIS — Z6830 Body mass index (BMI) 30.0-30.9, adult: Secondary | ICD-10-CM | POA: Diagnosis not present

## 2022-07-08 DIAGNOSIS — E6609 Other obesity due to excess calories: Secondary | ICD-10-CM | POA: Diagnosis not present

## 2022-07-08 DIAGNOSIS — M48061 Spinal stenosis, lumbar region without neurogenic claudication: Secondary | ICD-10-CM | POA: Diagnosis not present

## 2022-07-08 DIAGNOSIS — I5032 Chronic diastolic (congestive) heart failure: Secondary | ICD-10-CM | POA: Diagnosis not present

## 2022-07-08 DIAGNOSIS — E063 Autoimmune thyroiditis: Secondary | ICD-10-CM | POA: Diagnosis not present

## 2022-07-26 DIAGNOSIS — J449 Chronic obstructive pulmonary disease, unspecified: Secondary | ICD-10-CM | POA: Diagnosis not present

## 2022-07-26 DIAGNOSIS — I10 Essential (primary) hypertension: Secondary | ICD-10-CM | POA: Diagnosis not present

## 2022-07-26 DIAGNOSIS — E782 Mixed hyperlipidemia: Secondary | ICD-10-CM | POA: Diagnosis not present

## 2022-08-06 DIAGNOSIS — M1990 Unspecified osteoarthritis, unspecified site: Secondary | ICD-10-CM | POA: Diagnosis not present

## 2022-08-06 DIAGNOSIS — I700 Atherosclerosis of aorta: Secondary | ICD-10-CM | POA: Diagnosis not present

## 2022-08-06 DIAGNOSIS — I5032 Chronic diastolic (congestive) heart failure: Secondary | ICD-10-CM | POA: Diagnosis not present

## 2022-08-06 DIAGNOSIS — E6609 Other obesity due to excess calories: Secondary | ICD-10-CM | POA: Diagnosis not present

## 2022-08-06 DIAGNOSIS — I209 Angina pectoris, unspecified: Secondary | ICD-10-CM | POA: Diagnosis not present

## 2022-08-06 DIAGNOSIS — G894 Chronic pain syndrome: Secondary | ICD-10-CM | POA: Diagnosis not present

## 2022-08-06 DIAGNOSIS — Z6830 Body mass index (BMI) 30.0-30.9, adult: Secondary | ICD-10-CM | POA: Diagnosis not present

## 2022-08-06 DIAGNOSIS — M48061 Spinal stenosis, lumbar region without neurogenic claudication: Secondary | ICD-10-CM | POA: Diagnosis not present

## 2022-08-06 DIAGNOSIS — J449 Chronic obstructive pulmonary disease, unspecified: Secondary | ICD-10-CM | POA: Diagnosis not present

## 2022-08-08 ENCOUNTER — Inpatient Hospital Stay (HOSPITAL_COMMUNITY)
Admission: EM | Admit: 2022-08-08 | Discharge: 2022-08-11 | DRG: 302 | Disposition: A | Discharge status: Home / Self Care | Payer: Medicare HMO | Attending: Family Medicine | Admitting: Family Medicine

## 2022-08-08 DIAGNOSIS — I5033 Acute on chronic diastolic (congestive) heart failure: Secondary | ICD-10-CM | POA: Diagnosis not present

## 2022-08-08 DIAGNOSIS — Z7951 Long term (current) use of inhaled steroids: Secondary | ICD-10-CM | POA: Diagnosis not present

## 2022-08-08 DIAGNOSIS — I252 Old myocardial infarction: Secondary | ICD-10-CM | POA: Diagnosis not present

## 2022-08-08 DIAGNOSIS — E039 Hypothyroidism, unspecified: Secondary | ICD-10-CM | POA: Diagnosis not present

## 2022-08-08 DIAGNOSIS — N138 Other obstructive and reflux uropathy: Secondary | ICD-10-CM | POA: Diagnosis present

## 2022-08-08 DIAGNOSIS — I255 Ischemic cardiomyopathy: Secondary | ICD-10-CM | POA: Diagnosis present

## 2022-08-08 DIAGNOSIS — G309 Alzheimer's disease, unspecified: Secondary | ICD-10-CM | POA: Diagnosis not present

## 2022-08-08 DIAGNOSIS — I25700 Atherosclerosis of coronary artery bypass graft(s), unspecified, with unstable angina pectoris: Secondary | ICD-10-CM | POA: Diagnosis not present

## 2022-08-08 DIAGNOSIS — I200 Unstable angina: Secondary | ICD-10-CM | POA: Diagnosis not present

## 2022-08-08 DIAGNOSIS — I25110 Atherosclerotic heart disease of native coronary artery with unstable angina pectoris: Principal | ICD-10-CM | POA: Diagnosis present

## 2022-08-08 DIAGNOSIS — E782 Mixed hyperlipidemia: Secondary | ICD-10-CM | POA: Diagnosis present

## 2022-08-08 DIAGNOSIS — Z87440 Personal history of urinary (tract) infections: Secondary | ICD-10-CM | POA: Diagnosis not present

## 2022-08-08 DIAGNOSIS — Z79899 Other long term (current) drug therapy: Secondary | ICD-10-CM

## 2022-08-08 DIAGNOSIS — Z6831 Body mass index (BMI) 31.0-31.9, adult: Secondary | ICD-10-CM

## 2022-08-08 DIAGNOSIS — E785 Hyperlipidemia, unspecified: Secondary | ICD-10-CM | POA: Diagnosis present

## 2022-08-08 DIAGNOSIS — Z955 Presence of coronary angioplasty implant and graft: Secondary | ICD-10-CM | POA: Diagnosis not present

## 2022-08-08 DIAGNOSIS — I110 Hypertensive heart disease with heart failure: Secondary | ICD-10-CM | POA: Diagnosis present

## 2022-08-08 DIAGNOSIS — E669 Obesity, unspecified: Secondary | ICD-10-CM | POA: Diagnosis not present

## 2022-08-08 DIAGNOSIS — Z7982 Long term (current) use of aspirin: Secondary | ICD-10-CM | POA: Diagnosis not present

## 2022-08-08 DIAGNOSIS — N401 Enlarged prostate with lower urinary tract symptoms: Secondary | ICD-10-CM | POA: Diagnosis present

## 2022-08-08 DIAGNOSIS — J9811 Atelectasis: Secondary | ICD-10-CM | POA: Diagnosis not present

## 2022-08-08 DIAGNOSIS — R001 Bradycardia, unspecified: Secondary | ICD-10-CM | POA: Diagnosis present

## 2022-08-08 DIAGNOSIS — F0280 Dementia in other diseases classified elsewhere without behavioral disturbance: Secondary | ICD-10-CM | POA: Diagnosis present

## 2022-08-08 DIAGNOSIS — J449 Chronic obstructive pulmonary disease, unspecified: Secondary | ICD-10-CM | POA: Diagnosis not present

## 2022-08-08 DIAGNOSIS — Z79890 Hormone replacement therapy (postmenopausal): Secondary | ICD-10-CM

## 2022-08-08 DIAGNOSIS — R079 Chest pain, unspecified: Secondary | ICD-10-CM | POA: Diagnosis not present

## 2022-08-08 DIAGNOSIS — Z7902 Long term (current) use of antithrombotics/antiplatelets: Secondary | ICD-10-CM

## 2022-08-08 DIAGNOSIS — J811 Chronic pulmonary edema: Secondary | ICD-10-CM | POA: Diagnosis not present

## 2022-08-08 NOTE — ED Triage Notes (Signed)
Pt with c/o chest pain that started last night and pt took 4 SL NTG and then pain went away. Pt reports chest pain that came back today and has been off and on all day and unrelieved by NTG taken today. Pt with hx CABG. Pt also c/o SOB. Wife states pt has been moaning all day.

## 2022-08-09 ENCOUNTER — Emergency Department (HOSPITAL_COMMUNITY): Payer: Medicare HMO

## 2022-08-09 ENCOUNTER — Encounter (HOSPITAL_COMMUNITY): Payer: Self-pay | Admitting: Emergency Medicine

## 2022-08-09 ENCOUNTER — Other Ambulatory Visit: Payer: Self-pay

## 2022-08-09 ENCOUNTER — Inpatient Hospital Stay (HOSPITAL_COMMUNITY): Payer: Medicare HMO

## 2022-08-09 DIAGNOSIS — Z7951 Long term (current) use of inhaled steroids: Secondary | ICD-10-CM | POA: Diagnosis not present

## 2022-08-09 DIAGNOSIS — I110 Hypertensive heart disease with heart failure: Secondary | ICD-10-CM | POA: Diagnosis present

## 2022-08-09 DIAGNOSIS — R001 Bradycardia, unspecified: Secondary | ICD-10-CM | POA: Diagnosis present

## 2022-08-09 DIAGNOSIS — I200 Unstable angina: Secondary | ICD-10-CM | POA: Diagnosis present

## 2022-08-09 DIAGNOSIS — J449 Chronic obstructive pulmonary disease, unspecified: Secondary | ICD-10-CM | POA: Diagnosis present

## 2022-08-09 DIAGNOSIS — E669 Obesity, unspecified: Secondary | ICD-10-CM | POA: Diagnosis present

## 2022-08-09 DIAGNOSIS — I25700 Atherosclerosis of coronary artery bypass graft(s), unspecified, with unstable angina pectoris: Secondary | ICD-10-CM | POA: Diagnosis present

## 2022-08-09 DIAGNOSIS — Z79890 Hormone replacement therapy (postmenopausal): Secondary | ICD-10-CM | POA: Diagnosis not present

## 2022-08-09 DIAGNOSIS — Z7982 Long term (current) use of aspirin: Secondary | ICD-10-CM | POA: Diagnosis not present

## 2022-08-09 DIAGNOSIS — I252 Old myocardial infarction: Secondary | ICD-10-CM | POA: Diagnosis not present

## 2022-08-09 DIAGNOSIS — G309 Alzheimer's disease, unspecified: Secondary | ICD-10-CM | POA: Diagnosis present

## 2022-08-09 DIAGNOSIS — Z6831 Body mass index (BMI) 31.0-31.9, adult: Secondary | ICD-10-CM | POA: Diagnosis not present

## 2022-08-09 DIAGNOSIS — N138 Other obstructive and reflux uropathy: Secondary | ICD-10-CM | POA: Diagnosis present

## 2022-08-09 DIAGNOSIS — N401 Enlarged prostate with lower urinary tract symptoms: Secondary | ICD-10-CM | POA: Diagnosis present

## 2022-08-09 DIAGNOSIS — Z79899 Other long term (current) drug therapy: Secondary | ICD-10-CM | POA: Diagnosis not present

## 2022-08-09 DIAGNOSIS — J9811 Atelectasis: Secondary | ICD-10-CM | POA: Diagnosis present

## 2022-08-09 DIAGNOSIS — E039 Hypothyroidism, unspecified: Secondary | ICD-10-CM | POA: Diagnosis present

## 2022-08-09 DIAGNOSIS — Z87440 Personal history of urinary (tract) infections: Secondary | ICD-10-CM | POA: Diagnosis not present

## 2022-08-09 DIAGNOSIS — I5033 Acute on chronic diastolic (congestive) heart failure: Secondary | ICD-10-CM | POA: Diagnosis present

## 2022-08-09 DIAGNOSIS — E782 Mixed hyperlipidemia: Secondary | ICD-10-CM | POA: Diagnosis present

## 2022-08-09 DIAGNOSIS — Z7902 Long term (current) use of antithrombotics/antiplatelets: Secondary | ICD-10-CM | POA: Diagnosis not present

## 2022-08-09 DIAGNOSIS — Z955 Presence of coronary angioplasty implant and graft: Secondary | ICD-10-CM | POA: Diagnosis not present

## 2022-08-09 DIAGNOSIS — R079 Chest pain, unspecified: Secondary | ICD-10-CM

## 2022-08-09 DIAGNOSIS — F0280 Dementia in other diseases classified elsewhere without behavioral disturbance: Secondary | ICD-10-CM | POA: Diagnosis present

## 2022-08-09 DIAGNOSIS — I255 Ischemic cardiomyopathy: Secondary | ICD-10-CM | POA: Diagnosis present

## 2022-08-09 DIAGNOSIS — I25110 Atherosclerotic heart disease of native coronary artery with unstable angina pectoris: Secondary | ICD-10-CM | POA: Diagnosis present

## 2022-08-09 LAB — CBC WITH DIFFERENTIAL/PLATELET
Abs Immature Granulocytes: 0.02 K/uL (ref 0.00–0.07)
Basophils Absolute: 0 K/uL (ref 0.0–0.1)
Basophils Relative: 0 %
Eosinophils Absolute: 0.1 K/uL (ref 0.0–0.5)
Eosinophils Relative: 1 %
HCT: 39 % (ref 39.0–52.0)
Hemoglobin: 12.9 g/dL — ABNORMAL LOW (ref 13.0–17.0)
Immature Granulocytes: 0 %
Lymphocytes Relative: 15 %
Lymphs Abs: 1.5 K/uL (ref 0.7–4.0)
MCH: 31.3 pg (ref 26.0–34.0)
MCHC: 33.1 g/dL (ref 30.0–36.0)
MCV: 94.7 fL (ref 80.0–100.0)
Monocytes Absolute: 0.7 K/uL (ref 0.1–1.0)
Monocytes Relative: 7 %
Neutro Abs: 7.6 K/uL (ref 1.7–7.7)
Neutrophils Relative %: 77 %
Platelets: 195 K/uL (ref 150–400)
RBC: 4.12 MIL/uL — ABNORMAL LOW (ref 4.22–5.81)
RDW: 13.6 % (ref 11.5–15.5)
WBC: 9.9 K/uL (ref 4.0–10.5)
nRBC: 0 % (ref 0.0–0.2)

## 2022-08-09 LAB — ECHOCARDIOGRAM COMPLETE
AR max vel: 2.06 cm2
AV Area VTI: 2.01 cm2
AV Area mean vel: 2.06 cm2
AV Mean grad: 7.5 mmHg
AV Peak grad: 13.4 mmHg
Ao pk vel: 1.83 m/s
Area-P 1/2: 3.51 cm2
Calc EF: 50.4 %
Height: 70 in
MV VTI: 1.98 cm2
S' Lateral: 5 cm
Single Plane A2C EF: 50.2 %
Single Plane A4C EF: 56.8 %
Weight: 3488 oz

## 2022-08-09 LAB — BASIC METABOLIC PANEL
Anion gap: 10 (ref 5–15)
BUN: 16 mg/dL (ref 8–23)
CO2: 25 mmol/L (ref 22–32)
Calcium: 9.1 mg/dL (ref 8.9–10.3)
Chloride: 101 mmol/L (ref 98–111)
Creatinine, Ser: 1.01 mg/dL (ref 0.61–1.24)
GFR, Estimated: >60 mL/min (ref >60)
Glucose, Bld: 130 mg/dL — ABNORMAL HIGH (ref 70–99)
Potassium: 3.7 mmol/L (ref 3.5–5.1)
Sodium: 136 mmol/L (ref 135–145)

## 2022-08-09 LAB — TROPONIN I (HIGH SENSITIVITY)
Troponin I (High Sensitivity): 25 ng/L — ABNORMAL HIGH (ref <18)
Troponin I (High Sensitivity): 31 ng/L — ABNORMAL HIGH (ref <18)
Troponin I (High Sensitivity): 35 ng/L — ABNORMAL HIGH (ref <18)
Troponin I (High Sensitivity): 35 ng/L — ABNORMAL HIGH (ref <18)

## 2022-08-09 LAB — HEPARIN LEVEL (UNFRACTIONATED)
Heparin Unfractionated: <0.1 IU/mL — ABNORMAL LOW (ref 0.30–0.70)
Heparin Unfractionated: <0.1 IU/mL — ABNORMAL LOW (ref 0.30–0.70)
Heparin Unfractionated: 0.14 IU/mL — ABNORMAL LOW (ref 0.30–0.70)

## 2022-08-09 LAB — BRAIN NATRIURETIC PEPTIDE: B Natriuretic Peptide: 550 pg/mL — ABNORMAL HIGH (ref 0.0–100.0)

## 2022-08-09 MED ORDER — POTASSIUM CHLORIDE CRYS ER 20 MEQ PO TBCR
20.0000 meq | EXTENDED_RELEASE_TABLET | Freq: Once | ORAL | Status: AC
  Administered 2022-08-09: 20 meq via ORAL
  Filled 2022-08-09: qty 1

## 2022-08-09 MED ORDER — NITROGLYCERIN IN D5W 200-5 MCG/ML-% IV SOLN
5.0000 ug/min | INTRAVENOUS | Status: DC
  Administered 2022-08-09: 5 ug/min via INTRAVENOUS
  Filled 2022-08-09: qty 250

## 2022-08-09 MED ORDER — RANOLAZINE ER 500 MG PO TB12
500.0000 mg | ORAL_TABLET | Freq: Two times a day (BID) | ORAL | Status: DC
  Administered 2022-08-09 – 2022-08-10 (×4): 500 mg via ORAL
  Filled 2022-08-09 (×5): qty 1

## 2022-08-09 MED ORDER — ALBUTEROL SULFATE (2.5 MG/3ML) 0.083% IN NEBU
2.5000 mg | INHALATION_SOLUTION | Freq: Four times a day (QID) | RESPIRATORY_TRACT | Status: DC
Start: 2022-08-09 — End: 2022-08-10
  Administered 2022-08-09 – 2022-08-10 (×5): 2.5 mg via RESPIRATORY_TRACT
  Filled 2022-08-09 (×6): qty 3

## 2022-08-09 MED ORDER — ALBUTEROL SULFATE (2.5 MG/3ML) 0.083% IN NEBU
3.0000 mL | INHALATION_SOLUTION | Freq: Four times a day (QID) | RESPIRATORY_TRACT | Status: DC | PRN
  Administered 2022-08-09: 3 mL via RESPIRATORY_TRACT

## 2022-08-09 MED ORDER — CHLORHEXIDINE GLUCONATE CLOTH 2 % EX PADS
6.0000 | MEDICATED_PAD | Freq: Every day | CUTANEOUS | Status: DC
  Administered 2022-08-09 – 2022-08-10 (×2): 6 via TOPICAL

## 2022-08-09 MED ORDER — LORAZEPAM 0.5 MG PO TABS
0.5000 mg | ORAL_TABLET | Freq: Once | ORAL | Status: AC
  Administered 2022-08-09: 0.5 mg via ORAL
  Filled 2022-08-09: qty 1

## 2022-08-09 MED ORDER — HYDROMORPHONE HCL 1 MG/ML IJ SOLN
1.0000 mg | Freq: Once | INTRAMUSCULAR | Status: AC
  Administered 2022-08-09: 1 mg via INTRAVENOUS
  Filled 2022-08-09: qty 1

## 2022-08-09 MED ORDER — OXYCODONE HCL 5 MG PO TABS
30.0000 mg | ORAL_TABLET | ORAL | Status: DC | PRN
  Administered 2022-08-09: 30 mg via ORAL
  Filled 2022-08-09: qty 6

## 2022-08-09 MED ORDER — MEMANTINE HCL 10 MG PO TABS
10.0000 mg | ORAL_TABLET | Freq: Two times a day (BID) | ORAL | Status: DC
  Administered 2022-08-09 – 2022-08-10 (×4): 10 mg via ORAL
  Filled 2022-08-09 (×5): qty 1

## 2022-08-09 MED ORDER — CLOPIDOGREL BISULFATE 75 MG PO TABS
75.0000 mg | ORAL_TABLET | Freq: Every day | ORAL | Status: DC
  Administered 2022-08-09 – 2022-08-10 (×2): 75 mg via ORAL
  Filled 2022-08-09 (×3): qty 1

## 2022-08-09 MED ORDER — ASPIRIN 81 MG PO CHEW
324.0000 mg | CHEWABLE_TABLET | Freq: Once | ORAL | Status: AC
  Administered 2022-08-09: 324 mg via ORAL
  Filled 2022-08-09: qty 4

## 2022-08-09 MED ORDER — HEPARIN (PORCINE) 25000 UT/250ML-% IV SOLN
1650.0000 [IU]/h | INTRAVENOUS | Status: AC
  Administered 2022-08-09: 1000 [IU]/h via INTRAVENOUS
  Administered 2022-08-09: 1300 [IU]/h via INTRAVENOUS
  Administered 2022-08-10: 1650 [IU]/h via INTRAVENOUS
  Filled 2022-08-09 (×4): qty 250

## 2022-08-09 MED ORDER — ONDANSETRON HCL 4 MG/2ML IJ SOLN: 4.0000 mg | Freq: Four times a day (QID) | INTRAMUSCULAR | Status: DC | PRN

## 2022-08-09 MED ORDER — ISOSORBIDE MONONITRATE ER 60 MG PO TB24
120.0000 mg | ORAL_TABLET | Freq: Every day | ORAL | Status: DC
  Filled 2022-08-09: qty 2

## 2022-08-09 MED ORDER — FUROSEMIDE 10 MG/ML IJ SOLN
40.0000 mg | Freq: Once | INTRAMUSCULAR | Status: AC
  Administered 2022-08-09: 40 mg via INTRAVENOUS
  Filled 2022-08-09: qty 4

## 2022-08-09 MED ORDER — ACETAMINOPHEN 325 MG PO TABS
650.0000 mg | ORAL_TABLET | ORAL | Status: DC | PRN
Start: 2022-08-09 — End: 2022-08-11

## 2022-08-09 MED ORDER — HEPARIN BOLUS VIA INFUSION
4000.0000 [IU] | Freq: Once | INTRAVENOUS | Status: AC
Start: 2022-08-09 — End: 2022-08-09
  Administered 2022-08-09: 4000 [IU] via INTRAVENOUS

## 2022-08-09 MED ORDER — ROSUVASTATIN CALCIUM 20 MG PO TABS
40.0000 mg | ORAL_TABLET | Freq: Every day | ORAL | Status: DC
  Administered 2022-08-09 – 2022-08-10 (×2): 40 mg via ORAL
  Filled 2022-08-09 (×2): qty 2

## 2022-08-09 MED ORDER — HEPARIN BOLUS VIA INFUSION
2500.0000 [IU] | Freq: Once | INTRAVENOUS | Status: AC
  Administered 2022-08-09: 2500 [IU] via INTRAVENOUS

## 2022-08-09 MED ORDER — LEVOTHYROXINE SODIUM 75 MCG PO TABS
75.0000 ug | ORAL_TABLET | Freq: Every day | ORAL | Status: DC
  Administered 2022-08-09 – 2022-08-11 (×3): 75 ug via ORAL
  Filled 2022-08-09 (×2): qty 1
  Filled 2022-08-09: qty 2

## 2022-08-09 NOTE — ED Provider Notes (Signed)
[facility] EMERGENCY DEPARTMENT Provider Note   CSN: [id number] Arrival date & time: 08/08/22  2347     History  Chief Complaint  Patient presents with   Chest Pain    Dustin [name] is a 88 y.o. male.  Patient with a significant cardiac history presents to the emergency department for evaluation of intermittent chest pain.  Patient has taken multiple nitroglycerin for his pain.  Initially the pain went away with nitroglycerin, now has been persistent.  He has been feeling very short of breath as well.  He reports CABG nearly 20 years ago and then stenting after surgery as well.       Home Medications Prior to Admission medications   Medication Sig Start Date End Date Taking? Authorizing Provider  albuterol (PROVENTIL) (2.5 MG/3ML) 0.083% nebulizer solution INHALE 1 VIAL VIA NEBULIZER EVERY 6 HOURS AS NEEDED FOR WHEEZING OR SHORT OF BREATH. 06/14/22   Chesley Mires, MD  albuterol (VENTOLIN HFA) 108 (90 Base) MCG/ACT inhaler Inhale 2 puffs into the lungs every 6 (six) hours as needed for wheezing or shortness of breath. 12/31/21   Chesley Mires, MD  alfuzosin (UROXATRAL) 10 MG 24 hr tablet Take 1 tablet (10 mg total) by mouth daily with breakfast. 03/10/22   McKenzie, Candee Furbish, MD  aspirin EC 81 MG tablet Take 81 mg by mouth daily.    [provider]  Budeson-Glycopyrrol-Formoterol (BREZTRI AEROSPHERE) 160-9-4.8 MCG/ACT AERO Inhale 2 puffs into the lungs in the morning and at bedtime. 12/31/21   Chesley Mires, MD  busPIRone (BUSPAR) 10 MG tablet Take 10 mg by mouth 2 (two) times daily. 11/03/18   [provider]  cephALEXin (KEFLEX) 500 MG capsule Take 500 mg by mouth every 6 (six) hours. 02/11/22   [provider]  ciprofloxacin (CIPRO) 500 MG tablet Take 500 mg by mouth 2 (two) times daily. 03/08/22   [provider]  clopidogrel (PLAVIX) 75 MG tablet Take 1 tablet (75 mg total) by mouth daily. 09/28/21   Satira Sark, MD  doxazosin (CARDURA) 1 MG  tablet Take 1 mg by mouth at bedtime. 09/02/20   [provider]  dutasteride (AVODART) 0.5 MG capsule Take 1 capsule (0.5 mg total) by mouth daily. In the afternoon 03/10/22   Cleon Gustin, MD  escitalopram (LEXAPRO) 20 MG tablet Take 20 mg by mouth every morning.     [provider]  furosemide (LASIX) 40 MG tablet Take 1 tablet (40 mg total) by mouth in the morning. Patient taking differently: Take 40 mg by mouth daily. 03/02/21   End, Harrell Gave, MD  hydrOXYzine (ATARAX/VISTARIL) 25 MG tablet Take 25 mg by mouth 2 (two) times daily as needed for anxiety. 11/19/19   [provider]  isosorbide mononitrate (IMDUR) 60 MG 24 hr tablet Take 120 mg by mouth daily. 02/17/21   [provider]  levothyroxine (SYNTHROID) 75 MCG tablet Take 75 mcg by mouth daily. 02/23/22   [provider]  levothyroxine (SYNTHROID, LEVOTHROID) 50 MCG tablet Take 50 mcg by mouth every morning. 10/04/16   [provider]  meclizine (ANTIVERT) 25 MG tablet Take 25 mg by mouth 4 (four) times daily as needed for dizziness.    [provider]  memantine (NAMENDA) 10 MG tablet Take 10 mg by mouth 2 (two) times daily.  03/05/16   [provider]  MOVANTIK 25 MG TABS tablet Take 25 mg by mouth daily. 08/30/20   [provider]  mupirocin ointment (BACTROBAN) 2 %  Apply 1 application topically 3 (three) times daily as needed (affected areas on legs).    [provider]  nitroGLYCERIN (NITROSTAT) 0.4 MG SL tablet Place 1 tablet (0.4 mg total) under the tongue every 5 (five) minutes as needed for chest pain. Pt has bought nitro bottle from home and oxycodone 30 mg ( 3 or 4 tabs) is in nitro bottle per wife Keil Pickering. Patient taking differently: Place 0.4 mg under the tongue every 5 (five) minutes as needed for chest pain. 11/04/20   Satira Sark, MD  oxycodone (ROXICODONE) 30 MG immediate release tablet Take 30 mg by mouth every 4 (four)  hours as needed. 09/02/21   [provider]  pantoprazole (PROTONIX) 40 MG tablet Take 1 tablet (40 mg total) by mouth every morning. 02/16/20   Barton Dubois, MD  potassium chloride SA (K-DUR,KLOR-CON) 20 MEQ tablet Take 20 mEq by mouth daily.     [provider]  QUEtiapine (SEROQUEL) 25 MG tablet Take 25 mg by mouth at bedtime as needed (sleep). 11/19/19   [provider]  ranolazine (RANEXA) 500 MG 12 hr tablet Take 1 tablet (500 mg total) by mouth 2 (two) times daily. 03/23/21   Imogene Burn, PA-C  rosuvastatin (CRESTOR) 40 MG tablet Take 1 tablet (40 mg total) by mouth daily at 6 PM. 12/11/19   Hollice Gong, Mir Earlie Server, MD  sulfamethoxazole-trimethoprim (BACTRIM DS) 800-160 MG tablet Take 1 tablet by mouth every 12 (twelve) hours. 03/10/22   McKenzie, Candee Furbish, MD  tamsulosin (FLOMAX) 0.4 MG CAPS capsule Take 0.4 mg by mouth daily.    [provider]  triamcinolone cream (KENALOG) 0.1 % Apply 1 application topically 2 (two) times daily as needed (To affected areas on legs).    [provider]      Allergies    Patient has no known allergies.    Review of Systems   Review of Systems  Physical Exam Updated Vital Signs BP (!) 140/65   Pulse (!) 51   Temp 99 F (37.2 C) (Oral)   Resp 17   Ht '5\' 10"'$  (1.778 m)   Wt 98.9 kg   SpO2 93%   BMI 31.28 kg/m  Physical Exam Vitals and nursing note reviewed.  Constitutional:      General: He is not in acute distress.    Appearance: He is well-developed.  HENT:     Head: Normocephalic and atraumatic.     Mouth/Throat:     Mouth: Mucous membranes are moist.  Eyes:     General: Vision grossly intact. Gaze aligned appropriately.     Extraocular Movements: Extraocular movements intact.     Conjunctiva/sclera: Conjunctivae normal.  Cardiovascular:     Rate and Rhythm: Normal rate and regular rhythm.     Pulses: Normal pulses.     Heart sounds: Normal heart sounds, S1 normal and S2 normal. No  murmur heard.    No friction rub. No gallop.  Pulmonary:     Effort: Pulmonary effort is normal. No respiratory distress.     Breath sounds: Normal breath sounds.  Abdominal:     Palpations: Abdomen is soft.     Tenderness: There is no abdominal tenderness. There is no guarding or rebound.     Hernia: No hernia is present.  Musculoskeletal:        General: No swelling.     Cervical back: Full passive range of motion without pain, normal range of motion and neck supple. No pain with  movement, spinous process tenderness or muscular tenderness. Normal range of motion.     Right lower leg: No edema.     Left lower leg: No edema.  Skin:    General: Skin is warm and dry.     Capillary Refill: Capillary refill takes less than 2 seconds.     Findings: No ecchymosis, erythema, lesion or wound.  Neurological:     Mental Status: He is alert and oriented to person, place, and time.     GCS: GCS eye subscore is 4. GCS verbal subscore is 5. GCS motor subscore is 6.     Cranial Nerves: Cranial nerves 2-12 are intact.     Sensory: Sensation is intact.     Motor: Motor function is intact. No weakness or abnormal muscle tone.     Coordination: Coordination is intact.  Psychiatric:        Mood and Affect: Mood normal.        Speech: Speech normal.        Behavior: Behavior normal.     ED Results / Procedures / Treatments   Labs (all labs ordered are listed, but only abnormal results are displayed) Labs Reviewed  CBC WITH DIFFERENTIAL/PLATELET - Abnormal; Notable for the following components:      Result Value   RBC 4.12 (*)    Hemoglobin 12.9 (*)    All other components within normal limits  BASIC METABOLIC PANEL - Abnormal; Notable for the following components:   Glucose, Bld 130 (*)    All other components within normal limits  BRAIN NATRIURETIC PEPTIDE - Abnormal; Notable for the following components:   B Natriuretic Peptide 550.0 (*)    All other components within normal limits   TROPONIN I (HIGH SENSITIVITY) - Abnormal; Notable for the following components:   Troponin I (High Sensitivity) 25 (*)    All other components within normal limits  TROPONIN I (HIGH SENSITIVITY) - Abnormal; Notable for the following components:   Troponin I (High Sensitivity) 35 (*)    All other components within normal limits    EKG EKG Interpretation  Date/Time:  Sunday August 08 2022 23:59:54 EDT Ventricular Rate:  83 PR Interval:  169 QRS Duration: 131 QT Interval:  360 QTC Calculation: 423 R Axis:   56 Text Interpretation: Sinus rhythm Multiple ventricular premature complexes Nonspecific intraventricular conduction delay Repol abnrm suggests ischemia, diffuse leads Confirmed by Orpah Greek ([id number]) on 08/09/2022 12:07:03 AM  Radiology DG Chest Port 1 View  Result Date: 08/09/2022 CLINICAL DATA:  Chest pain. EXAM: PORTABLE CHEST 1 VIEW COMPARISON:  July 31, 2021 FINDINGS: Multiple sternal wires and vascular clips are seen. The cardiac silhouette is mildly enlarged and unchanged in size. Mild, diffusely increased interstitial lung markings are seen. Mild prominence of the perihilar pulmonary vasculature is also noted. Mild, stable bibasilar atelectasis is present. There is no evidence of focal consolidation, pleural effusion or pneumothorax. Multilevel degenerative changes are seen throughout the thoracic spine. IMPRESSION: 1. Evidence of prior median sternotomy/CABG. 2. Mild pulmonary vascular congestion. 3. Mild, stable bibasilar atelectasis. Electronically Signed   By: Virgina Norfolk M.D.   On: 08/09/2022 00:35    Procedures Procedures    Medications Ordered in ED Medications  nitroGLYCERIN 50 mg in dextrose 5 % 250 mL (0.2 mg/mL) infusion (10 mcg/min Intravenous Rate/Dose Change 08/09/22 0042)  heparin ADULT infusion 100 units/mL (25000 units/221m) (1,000 Units/hr Intravenous New Bag/Given 08/09/22 0032)  aspirin chewable tablet 324 mg (324 mg Oral Given 08/09/22  9629)  heparin bolus via infusion 4,000 Units (4,000 Units Intravenous Bolus from Bag 08/09/22 0032)  LORazepam (ATIVAN) tablet 0.5 mg (0.5 mg Oral Given 08/09/22 0049)  HYDROmorphone (DILAUDID) injection 1 mg (1 mg Intravenous Given 08/09/22 0137)    ED Course/ Medical Decision Making/ A&P                           Medical Decision Making Amount and/or Complexity of Data Reviewed Labs: ordered. Radiology: ordered.  Risk OTC drugs. Prescription drug management.   Patient presents to the emergency department with symptoms that sound concerning for unstable angina/acute coronary syndrome.  Patient with chest pain requiring multiple nitroglycerin that has become recurrent.  Reviewing his records reveals severe CAD.  Patient with prior CABG.  He had stenting in 2020.  Patient had repeat catheterization in 2022 which showed no new regions for stenting, recommendations for aggressive medical management were made at that time.  Patient hypertensive at arrival.  It was unclear if he was experiencing active chest pain because of his dementia.  He did have some ST depressions on EKG not seen previously.  Patient therefore initiated on IV nitro and IV heparin.  He has not had any chest pain while in the emergency department.  Blood pressure is much improved.  Patient complaining of pain in his hips and legs which seems somewhat chronic.  Improved with Dilaudid.  Discussed with Dr. Humphrey Rolls, on-call for cardiology.  Feels that aggressive medical management with antianginals would be the treatment in this patient.  Does not feel that the patient requires transfer to [facility] at this time.  Agrees with current treatment plan.  CRITICAL CARE Performed by: Orpah Greek   Total critical care time: 35 minutes  Critical care time was exclusive of separately billable procedures and treating other patients.  Critical care was necessary to treat or prevent imminent or life-threatening  deterioration.  Critical care was time spent personally by me on the following activities: development of treatment plan with patient and/or surrogate as well as nursing, discussions with consultants, evaluation of patient's response to treatment, examination of patient, obtaining history from patient or surrogate, ordering and performing treatments and interventions, ordering and review of laboratory studies, ordering and review of radiographic studies, pulse oximetry and re-evaluation of patient's condition.         Final Clinical Impression(s) / ED Diagnoses Final diagnoses:  Unstable angina [facility])    Rx / DC Orders ED Discharge Orders     None         Purl Claytor, Gwenyth Allegra, MD 08/09/22 [phone]

## 2022-08-09 NOTE — Progress Notes (Signed)
  Transition of Care [facility]) Screening Note   Patient Details  Name: AJA BOLANDER Date of Birth: Oct 13, 1934   Transition of Care [facility]) CM/SW Contact:    Iona Beard, [place] Phone Number: 08/09/2022, 11:39 AM    Transition of Care Department [facility]) has reviewed patient and no TOC needs have been identified at this time. We will continue to monitor patient advancement through interdisciplinary progression rounds. If new patient transition needs arise, please place a TOC consult.

## 2022-08-09 NOTE — Progress Notes (Signed)
ANTICOAGULATION CONSULT NOTE -  Pharmacy Consult for Heparin  Indication: chest pain/ACS  No Known Allergies  Patient Measurements: Height: '5\' 10"'$  (177.8 cm) Weight: 98.9 kg (218 lb) IBW/kg (Calculated) : 73 HEPARIN DW (KG): 93.5   Vital Signs: Temp: 98.1 F (36.7 C) (08/14 0949) Temp Source: Oral (08/14 0609) BP: 144/59 (08/14 1116) Pulse Rate: 63 (08/14 1116)  Labs: Recent Labs    08/09/22 0011 08/09/22 0206 08/09/22 0716 08/09/22 0848  HGB 12.9*  --   --   --   HCT 39.0  --   --   --   PLT 195  --   --   --   HEPARINUNFRC  --   --   --  <0.10*  CREATININE 1.01  --   --   --   TROPONINIHS 25* 35* 35* 31*     Estimated Creatinine Clearance: 63.1 mL/min (by C-G formula based on SCr of 1.01 mg/dL).   Medical History: Past Medical History:  Diagnosis Date   Alzheimer disease ([place])    Anxiety    Arthritis    Atrophic gastritis    a. By EGD 02/2013.   Carotid artery disease (HCC)    a. mild-mod plaque, <50% stenosis bilat by duplex 2018.   Coronary atherosclerosis of native coronary artery    a. Multivessel s/p CABG 1996. b. s/p DES x 2 SVG to PDA 8/12 with distal disease managed medically.   DDD (degenerative disc disease)    Chronic back pain   Dementia (HCC)    Enlarged prostate    Essential hypertension    Hematuria    Hypothyroidism    LBBB (left bundle branch block)    MI (myocardial infarction) (HCC)    Mixed hyperlipidemia    OA (osteoarthritis)    OSA (obstructive sleep apnea)    Pneumonia due to COVID-19 virus    February 2021   Sinus bradycardia    a. Aricept and BB discontinued due to this.     Assessment: 88 y/o M with long standing cardiac history here with chest pain. Very mild elevation in high sensitivity troponin. Starting heparin. Labs above reviewed. PTA meds reviewed.   HL <0.1, no issues with infusion per RN Plan is for 48 hours heparin   Goal of Therapy:  Heparin level 0.3-0.7 units/ml Monitor platelets by anticoagulation  protocol: Yes   Plan:  Heparin 2500 units BOLUS Increase heparin drip to 1300 units/hr Heparin level in ~8 hours Daily CBC/Heparin level Monitor for bleeding  Isac Sarna, BS Pharm D, BCPS Clinical Pharmacist

## 2022-08-09 NOTE — Hospital Course (Addendum)
Dustin [name] is a 88 y.o. male with medical history significant of coronary artery disease status post multiple stents and CABG in 1996 with the last About a year ago recommendations for medical management, essential hypertension, hypothyroidism, Alzheimer's dementia, hyperlipidemia, BPH, depression with anxiety and degenerative disc disease who presented to the ER with escalating progressive chest pain and shortness of breath.  Chest pain was substernal and consistent with his previous MIs.  No significant radiation.  He took his nitroglycerin at home with no significant relief.   Patient has been on Imdur and Ranexa, not clear if he has been taking these medicines lately.  Initial troponin was 30 and then 25.  No significant EKG changes.   Case discussed with cardiology at [facility] who recommends admission here with cardiology evaluation.  We are therefore admitting the patient with unstable angina initiating heparin and nitroglycerin drip.   Assessment and Plan:   Principal Problem:   Unstable angina (HCC) Active Problems:   Hyperlipidemia   Alzheimer disease ([place])   Hypothyroidism   Benign prostatic hyperplasia with urinary obstruction   COPD (chronic obstructive pulmonary disease) (HCC)  Unstable angina:  -Angina has improved, Troponin remained flat 20-30s--no indication of acute ACS -He has been on heparin drip and nitroglycerin drip---was discontinued -Known CAD, on medical management  -Currently cardiology recommending medical management-  CAD status post CABG 1996, DES x2 SVG to the PDA 2012 distal disease managed medically, DES to the distal left main 11/2019, 2 out of 5 grafts patent,   Cath -03/02/2021- LVEDP and mildly reduced LVEF 45 to 50% Lasix increased to 40 mg daily -ischemic cardiomyopathy  Repeat echo: - -Continue Plavix, Imdur -Aldactone was added by cardiology -Continue Ranexa, and Crestor   Patient apparently not a candidate for SGLT2 inhibitor due to history of  UTIs   Uncontrolled hypertension:  Restart oral medication, discontinue nitroglycerin drip BP was elevated on admission, improving   BPH: Resume home regimen of Flomax.   Hypothyroidism: Continue levothyroxine   Dementia: Alzheimer's.  Stable.  Continue home regimen of Namenda   COPD: No acute exacerbation

## 2022-08-09 NOTE — Progress Notes (Signed)
*  PRELIMINARY RESULTS* Echocardiogram 2D Echocardiogram has been performed.  Dustin [name] 08/09/2022, 12:44 PM

## 2022-08-09 NOTE — Progress Notes (Signed)
ANTICOAGULATION CONSULT NOTE - Initial Consult  Pharmacy Consult for Heparin  Indication: chest pain/ACS  No Known Allergies  Patient Measurements: Height: '5\' 10"'$  (177.8 cm) Weight: 98.9 kg (218 lb) IBW/kg (Calculated) : 73  Vital Signs: Temp: 99 F (37.2 C) (08/14 0005) Temp Source: Oral (08/14 0005) BP: 145/58 (08/14 0350) Pulse Rate: 58 (08/14 0350)  Labs: Recent Labs    08/09/22 0011 08/09/22 0206  HGB 12.9*  --   HCT 39.0  --   PLT 195  --   CREATININE 1.01  --   TROPONINIHS 25* 35*    Estimated Creatinine Clearance: 63.1 mL/min (by C-G formula based on SCr of 1.01 mg/dL).   Medical History: Past Medical History:  Diagnosis Date   Alzheimer disease ([place])    Anxiety    Arthritis    Atrophic gastritis    a. By EGD 02/2013.   Carotid artery disease (HCC)    a. mild-mod plaque, <50% stenosis bilat by duplex 2018.   Coronary atherosclerosis of native coronary artery    a. Multivessel s/p CABG 1996. b. s/p DES x 2 SVG to PDA 8/12 with distal disease managed medically.   DDD (degenerative disc disease)    Chronic back pain   Dementia (HCC)    Enlarged prostate    Essential hypertension    Hematuria    Hypothyroidism    LBBB (left bundle branch block)    MI (myocardial infarction) (HCC)    Mixed hyperlipidemia    OA (osteoarthritis)    OSA (obstructive sleep apnea)    Pneumonia due to COVID-19 virus    February 2021   Sinus bradycardia    a. Aricept and BB discontinued due to this.     Assessment: 88 y/o M with long standing cardiac history here with chest pain. Very mild elevation in high sensitivity troponin. Starting heparin. Labs above reviewed. PTA meds reviewed.   Goal of Therapy:  Heparin level 0.3-0.7 units/ml Monitor platelets by anticoagulation protocol: Yes   Plan:  Heparin 4000 units BOLUS Start heparin drip at 1000 units/hr 0900 Heparin level Daily CBC/Heparin level Monitor for bleeding  Narda Bonds, PharmD, BCPS Clinical  Pharmacist Phone: [phone]

## 2022-08-09 NOTE — Progress Notes (Signed)
PROGRESS NOTE    Patient: Dustin [name]                            PCP: [name], MD                    DOB: [birth year]            DOA: 08/08/2022 GMW:[id number]             DOS: 08/09/2022, 1:06 PM   LOS: 0 days   Date of Service: The patient was seen and examined on 08/09/2022  Subjective:   The patient was seen and examined this morning. Stable at this time. Reporting no chest pain this morning. Otherwise no issues overnight .  Brief Narrative:   Dustin [name] is a 88 y.o. male with medical history significant of coronary artery disease status post multiple stents and CABG in 1996 with the last About a year ago recommendations for medical management, essential hypertension, hypothyroidism, Alzheimer's dementia, hyperlipidemia, BPH, depression with anxiety and degenerative disc disease who presented to the ER with escalating progressive chest pain and shortness of breath.  Chest pain was substernal and consistent with his previous MIs.  No significant radiation.  He took his nitroglycerin at home with no significant relief.   Patient has been on Imdur and Ranexa, not clear if he has been taking these medicines lately.  Initial troponin was 30 and then 25.  No significant EKG changes.   Case discussed with cardiology at [facility] who recommends admission here with cardiology evaluation.  We are therefore admitting the patient with unstable angina initiating heparin and nitroglycerin drip.   Assessment and Plan:   Principal Problem:   Unstable angina (HCC) Active Problems:   Hyperlipidemia   Alzheimer disease (HCC)   Hypothyroidism   Benign prostatic hyperplasia with urinary obstruction   COPD (chronic obstructive pulmonary disease) (HCC)  Unstable angina:  -Angina has improved this morning -Known CAD, on medical management -Was admitted for angina, troponin remained flat  -On heparin drip and feels comfortable.  Also on nitroglycerin drip. -Audiology following  closely-appreciate further input -Currently cardiology recommending medical management  CAD status post CABG 1996, DES x2 SVG to the PDA 2012 distal disease managed medically, DES to the distal left main 11/2019, 2 out of 5 grafts patent,   Cath -03/02/2021- LVEDP and mildly reduced LVEF 45 to 50% Lasix increased to 40 mg daily -ischemic cardiomyopathy  Repeat echo today -Continue Plavix, Imdur, Ranexa, and Crestor   Uncontrolled hypertension: Blood pressure is better now on the nitroglycerin drip.     BPH: Resume home regimen of Flomax.   Hypothyroidism: Continue levothyroxine   Dementia: Alzheimer's.  Stable.  Continue home regimen of Namenda   COPD: No acute exacerbation      Skin Assessment: I have examined the patient's skin and I agree with the wound assessment as performed by wound care team As outlined belowe: Pressure Injury 02/15/17 Deep Tissue Injury - Purple or maroon localized area of discolored intact skin or blood-filled blister due to damage of underlying soft tissue from pressure and/or shear. maroon color localized area on right buttocks (Active)  02/15/17 1000  Location: Buttocks  Location Orientation: Right  Staging: Deep Tissue Injury - Purple or maroon localized area of discolored intact skin or blood-filled blister due to damage of underlying soft tissue from pressure and/or shear.  Wound Description (Comments): maroon color localized area on  right buttocks  Present on Admission: No    -----------------------------------------------------------------------------------------------------------------------------------  DVT prophylaxis:     Code Status:   Code Status: Full Code  Family Communication: No family member present at bedside- attempt will be made to update daily The above findings and plan of care has been discussed with patient (and family)  in detail,  they expressed understanding and agreement of above. -Advance care planning has been  discussed.   Admission status:   Status is: Inpatient Remains inpatient appropriate because: Needing continue close evaluation by cardiology     Procedures:   No admission procedures for hospital encounter.   Antimicrobials:  Anti-infectives (From admission, onward)    None        Medication:   albuterol  2.5 mg Nebulization QID   clopidogrel  75 mg Oral Daily   isosorbide mononitrate  120 mg Oral Daily   levothyroxine  75 mcg Oral Daily   memantine  10 mg Oral BID   ranolazine  500 mg Oral BID   rosuvastatin  40 mg Oral q1800    acetaminophen, albuterol, ondansetron (ZOFRAN) IV, oxycodone   Objective:   Vitals:   08/09/22 0930 08/09/22 0949 08/09/22 1116 08/09/22 1230  BP: (!) 155/56  (!) 144/59 (!) 142/51  Pulse: (!) 56  63 (!) 102  Resp: '20  16 19  '$ Temp:  98.1 F (36.7 C)    TempSrc:      SpO2: 92%  91% (!) 79%  Weight:      Height:        Intake/Output Summary (Last 24 hours) at 08/09/2022 1306 Last data filed at 08/09/2022 3893 Gross per 24 hour  Intake 157.45 ml  Output --  Net 157.45 ml   Filed Weights   08/09/22 0007  Weight: 98.9 kg     Examination:   Physical Exam  Constitution:  Alert, cooperative, no distress,  Appears calm and comfortable  Psychiatric:   Normal and stable mood and affect, cognition intact,   HEENT:        Normocephalic, PERRL, otherwise with in Normal limits  Chest:         Chest symmetric Cardio vascular:  S1/S2, RRR, No murmure, No Rubs or Gallops  pulmonary: Clear to auscultation bilaterally, respirations unlabored, negative wheezes / crackles Abdomen: Soft, non-tender, non-distended, bowel sounds,no masses, no organomegaly Muscular skeletal: Limited exam - in bed, able to move all 4 extremities,   Neuro: CNII-XII intact. , normal motor and sensation, reflexes intact  Extremities: No pitting edema lower extremities, +2 pulses  Skin: Dry, warm to touch, negative for any Rashes, No open wounds Wounds: per  nursing documentation   ------------------------------------------------------------------------------------------------------------------------------------------    LABs:     Latest Ref Rng & Units 08/09/2022   12:11 AM 08/03/2021    2:04 AM 08/02/2021    1:36 AM  CBC  WBC 4.0 - 10.5 K/uL 9.9  4.3  3.8   Hemoglobin 13.0 - 17.0 g/dL 12.9  12.0  12.6   Hematocrit 39.0 - 52.0 % 39.0  37.0  37.9   Platelets 150 - 400 K/uL 195  182  177       Latest Ref Rng & Units 08/09/2022   12:11 AM 08/03/2021    2:04 AM 08/02/2021    1:36 AM  CMP  Glucose 70 - 99 mg/dL 130  134  143   BUN 8 - 23 mg/dL '16  16  17   '$ Creatinine 0.61 - 1.24 mg/dL 1.01  0.95  0.97   Sodium 135 - 145 mmol/L 136  137  138   Potassium 3.5 - 5.1 mmol/L 3.7  4.0  3.6   Chloride 98 - 111 mmol/L 101  105  105   CO2 22 - 32 mmol/L '25  26  26   '$ Calcium 8.9 - 10.3 mg/dL 9.1  8.4  8.4   Total Protein 6.5 - 8.1 g/dL  5.5  5.6   Total Bilirubin 0.3 - 1.2 mg/dL  0.5  0.7   Alkaline Phos 38 - 126 U/L  31  35   AST 15 - 41 U/L  20  28   ALT 0 - 44 U/L  18  21        Micro Results No results found for this or any previous visit (from the past 240 hour(s)).  Radiology Reports ECHOCARDIOGRAM COMPLETE  Result Date: 08/09/2022    ECHOCARDIOGRAM REPORT   Patient Name:   JERELLE VIRDEN Date of Exam: 08/09/2022 Medical Rec #:  [id number]       Height:       70.0 in Accession #:    [id number]      Weight:       218.0 lb Date of Birth:  1934-08-31       BSA:          2.165 m Patient Age:    63 years        BP:           144/59 mmHg Patient Gender: M               HR:           67 bpm. Exam Location:  [facility] Procedure: 2D Echo, Cardiac Doppler and Color Doppler Indications:    Chest Pain  History:        Patient has prior history of Echocardiogram examinations, most                 recent 12/10/2019. CHF, CAD and Previous Myocardial Infarction,                 COPD, Arrythmias:Bradycardia, Signs/Symptoms:Chest Pain and                  Alzheimer's; Risk Factors:Hypertension and Dyslipidemia.  Sonographer:    Wenda Low Referring Phys: [place]  1. Left ventricular ejection fraction, by estimation, is 50 to 55%. The left ventricle has low normal function. The left ventricle demonstrates regional wall motion abnormalities (see scoring diagram/findings for description). The left ventricular internal cavity size was mildly dilated. There is moderate left ventricular hypertrophy. Left ventricular diastolic parameters are consistent with Grade II diastolic dysfunction (pseudonormalization).  2. Right ventricular systolic function is low normal. The right ventricular size is normal. There is mildly elevated pulmonary artery systolic pressure. The estimated right ventricular systolic pressure is 47.4 mmHg.  3. Left atrial size was moderately dilated.  4. The mitral valve is degenerative. Mild mitral valve regurgitation. The mean mitral valve gradient is 2.0 mmHg.  5. The aortic valve is tricuspid. Aortic valve regurgitation is not visualized. Aortic valve sclerosis/calcification is present, without any evidence of aortic stenosis. Aortic valve mean gradient measures 7.5 mmHg.  6. The inferior vena cava is normal in size with <50% respiratory variability, suggesting right atrial pressure of 8 mmHg. Comparison(s): Prior images reviewed side by side. LVEF low normal range at 50-55% with basal inferoposterior hypokinesis. Mildly increased RVSP. FINDINGS  Left Ventricle:  Left ventricular ejection fraction, by estimation, is 50 to 55%. The left ventricle has low normal function. The left ventricle demonstrates regional wall motion abnormalities. The left ventricular internal cavity size was mildly dilated. There is moderate left ventricular hypertrophy. Left ventricular diastolic parameters are consistent with Grade II diastolic dysfunction (pseudonormalization).  LV Wall Scoring: The basal inferolateral segment and basal inferior  segment are hypokinetic. The entire anterior wall, antero-lateral wall, mid and distal lateral wall, entire septum, entire apex, and mid and distal inferior wall are normal. Right Ventricle: The right ventricular size is normal. No increase in right ventricular wall thickness. Right ventricular systolic function is low normal. There is mildly elevated pulmonary artery systolic pressure. The tricuspid regurgitant velocity is 2.71 m/s, and with an assumed right atrial pressure of 8 mmHg, the estimated right ventricular systolic pressure is 43.1 mmHg. Left Atrium: Left atrial size was moderately dilated. Right Atrium: Right atrial size was normal in size. Pericardium: There is no evidence of pericardial effusion. Mitral Valve: The mitral valve is degenerative in appearance. There is mild thickening of the mitral valve leaflet(s). Mild mitral annular calcification. Mild mitral valve regurgitation. MV peak gradient, 7.4 mmHg. The mean mitral valve gradient is 2.0 mmHg. Tricuspid Valve: The tricuspid valve is grossly normal. Tricuspid valve regurgitation is trivial. Aortic Valve: The aortic valve is tricuspid. There is moderate aortic valve annular calcification. Aortic valve regurgitation is not visualized. Aortic valve sclerosis/calcification is present, without any evidence of aortic stenosis. Aortic valve mean gradient measures 7.5 mmHg. Aortic valve peak gradient measures 13.4 mmHg. Aortic valve area, by VTI measures 2.01 cm. Pulmonic Valve: The pulmonic valve was grossly normal. Pulmonic valve regurgitation is mild. Aorta: The aortic root is normal in size and structure. Venous: The inferior vena cava is normal in size with less than 50% respiratory variability, suggesting right atrial pressure of 8 mmHg. IAS/Shunts: No atrial level shunt detected by color flow Doppler.  LEFT VENTRICLE PLAX 2D LVIDd:         5.90 cm     Diastology LVIDs:         5.00 cm     LV e' medial:    5.22 cm/s LV PW:         1.30 cm     LV  E/e' medial:  22.2 LV IVS:        1.50 cm     LV e' lateral:   6.85 cm/s LVOT diam:     2.00 cm     LV E/e' lateral: 16.9 LV SV:         95 LV SV Index:   44 LVOT Area:     3.14 cm  LV Volumes (MOD) LV vol d, MOD A2C: 93.1 ml LV vol d, MOD A4C: 68.8 ml LV vol s, MOD A2C: 46.4 ml LV vol s, MOD A4C: 29.7 ml LV SV MOD A2C:     46.7 ml LV SV MOD A4C:     68.8 ml LV SV MOD BP:      40.1 ml RIGHT VENTRICLE RV Basal diam:  3.60 cm RV Mid diam:    3.30 cm RV S prime:     11.00 cm/s TAPSE (M-mode): 2.7 cm LEFT ATRIUM           Index        RIGHT ATRIUM           Index LA diam:      4.50 cm 2.08 cm/m   RA Area:  22.20 cm LA Vol (A2C): 47.1 ml 21.75 ml/m  RA Volume:   62.70 ml  28.96 ml/m LA Vol (A4C): 91.6 ml 42.31 ml/m  AORTIC VALVE                     PULMONIC VALVE AV Area (Vmax):    2.06 cm      PV Vmax:       0.93 m/s AV Area (Vmean):   2.06 cm      PV Peak grad:  3.5 mmHg AV Area (VTI):     2.01 cm AV Vmax:           183.00 cm/s AV Vmean:          132.000 cm/s AV VTI:            0.470 m AV Peak Grad:      13.4 mmHg AV Mean Grad:      7.5 mmHg LVOT Vmax:         120.00 cm/s LVOT Vmean:        86.500 cm/s LVOT VTI:          0.301 m LVOT/AV VTI ratio: 0.64  AORTA Ao Root diam: 3.40 cm Ao Asc diam:  2.90 cm MITRAL VALVE                TRICUSPID VALVE MV Area (PHT): 3.51 cm     TR Peak grad:   29.4 mmHg MV Area VTI:   1.98 cm     TR Vmax:        271.00 cm/s MV Peak grad:  7.4 mmHg MV Mean grad:  2.0 mmHg     SHUNTS MV Vmax:       1.36 m/s     Systemic VTI:  0.30 m MV Vmean:      51.8 cm/s    Systemic Diam: 2.00 cm MV Decel Time: 216 msec MV E velocity: 116.00 cm/s MV A velocity: 97.30 cm/s MV E/A ratio:  1.19 Rozann Lesches MD Electronically signed by Rozann Lesches MD Signature Date/Time: 08/09/2022/12:59:18 PM    Final    DG Chest Port 1 View  Result Date: 08/09/2022 CLINICAL DATA:  Chest pain. EXAM: PORTABLE CHEST 1 VIEW COMPARISON:  July 31, 2021 FINDINGS: Multiple sternal wires and vascular clips are  seen. The cardiac silhouette is mildly enlarged and unchanged in size. Mild, diffusely increased interstitial lung markings are seen. Mild prominence of the perihilar pulmonary vasculature is also noted. Mild, stable bibasilar atelectasis is present. There is no evidence of focal consolidation, pleural effusion or pneumothorax. Multilevel degenerative changes are seen throughout the thoracic spine. IMPRESSION: 1. Evidence of prior median sternotomy/CABG. 2. Mild pulmonary vascular congestion. 3. Mild, stable bibasilar atelectasis. Electronically Signed   By: Virgina Norfolk M.D.   On: 08/09/2022 00:35    SIGNED: Deatra James, MD, FHM. [name],  Pager (please use [email or web address] to page/text) Please use Epic Secure Chat for non-urgent communication (7AM-7PM)  If 7PM-7AM, please contact night-coverage [email or web address], 08/09/2022, 1:06 PM   \

## 2022-08-09 NOTE — H&P (Signed)
History and Physical    Patient: Dustin [name]:[id number] DOB: 05-28-1934 DOA: 08/08/2022 DOS: the patient was seen and examined on 08/09/2022 PCP: [name], MD  Patient coming from: Home  Chief Complaint:  Chief Complaint  Patient presents with   Chest Pain   HPI: Dustin [name] is a 88 y.o. male with medical history significant of coronary artery disease status post multiple stents and CABG in 1996 with the last About a year ago recommendations for medical management, essential hypertension, hypothyroidism, Alzheimer's dementia, hyperlipidemia, BPH, depression with anxiety and degenerative disc disease who presented to the ER with escalating progressive chest pain and shortness of breath.  Chest pain was substernal and consistent with his previous MIs.  No significant radiation.  He took his nitroglycerin at home with no significant relief.  Patient has been on Imdur and Ranexa, not clear if he has been taking these medicines lately.  Initial troponin was 30 and then 25.  No significant EKG changes.  Case discussed with cardiology at [facility] who recommends admission here with cardiology evaluation.  We are therefore admitting the patient with unstable angina initiating heparin and nitroglycerin drip.  Review of Systems: As mentioned in the history of present illness. All other systems reviewed and are negative. Past Medical History:  Diagnosis Date   Alzheimer disease ([place])    Anxiety    Arthritis    Atrophic gastritis    a. By EGD 02/2013.   Carotid artery disease (HCC)    a. mild-mod plaque, <50% stenosis bilat by duplex 2018.   Coronary atherosclerosis of native coronary artery    a. Multivessel s/p CABG 1996. b. s/p DES x 2 SVG to PDA 8/12 with distal disease managed medically.   DDD (degenerative disc disease)    Chronic back pain   Dementia (HCC)    Enlarged prostate    Essential hypertension    Hematuria    Hypothyroidism    LBBB (left bundle branch block)    MI  (myocardial infarction) (HCC)    Mixed hyperlipidemia    OA (osteoarthritis)    OSA (obstructive sleep apnea)    Pneumonia due to COVID-19 virus    February 2021   Sinus bradycardia    a. Aricept and BB discontinued due to this.   Past Surgical History:  Procedure Laterality Date   CATARACT EXTRACTION W/PHACO Left 03/22/2022   Procedure: CATARACT EXTRACTION PHACO AND INTRAOCULAR LENS PLACEMENT (IOC);  Surgeon: Baruch Goldmann, MD;  Location: [facility];  Service: Ophthalmology;  Laterality: Left;  CDE: 25.22   COLONOSCOPY  08/03/2004   Jenkins-numerous large diverticula in the descending, transverse, descending, and sigmoid colon. Otherwise normal exam.   COLONOSCOPY  07/12/2012   RMR: External hemorrhoidal tag; multiple rectal and colonic polyps removed and/or treated as described above. Pancolonic diverticulosis. Bx-tubular adenomas, rectal hyperplastic polyp. next colonoscopy in 06/2015.   COLONOSCOPY N/A 06/22/2016   Procedure: COLONOSCOPY;  Surgeon: Aviva Signs, MD;  Location: [facility];  Service: Gastroenterology;  Laterality: N/A;  42   CORONARY ANGIOPLASTY WITH STENT PLACEMENT     CORONARY ARTERY BYPASS GRAFT  1996   LIMA to LAD, SVG to D2, SVG to PDA, SVG to OM1 and OM2   CORONARY STENT INTERVENTION N/A 12/10/2019   Procedure: CORONARY STENT INTERVENTION;  Surgeon: Burnell Blanks, MD;  Location: [place] [facility];  Service: Cardiovascular;  Laterality: N/A;   ESOPHAGOGASTRODUODENOSCOPY N/A 03/16/2013   Procedure: ESOPHAGOGASTRODUODENOSCOPY (EGD);  Surgeon: Daneil Dolin, MD;  Location:  [facility];  Service: Endoscopy;  Laterality: N/A;  12:00-moved to [place] notified pt   ESOPHAGOGASTRODUODENOSCOPY N/A 03/06/2013   Procedure: ESOPHAGOGASTRODUODENOSCOPY (EGD);  Surgeon: Daneil Dolin, MD;  Location: [facility];  Service: Endoscopy;  Laterality: N/A;   HERNIA REPAIR     INTRAVASCULAR ULTRASOUND/IVUS N/A 12/10/2019   Procedure: Intravascular Ultrasound/IVUS;   Surgeon: Burnell Blanks, MD;  Location: [place] [facility];  Service: Cardiovascular;  Laterality: N/A;   LEFT HEART CATH AND CORS/GRAFTS ANGIOGRAPHY N/A 12/10/2019   Procedure: LEFT HEART CATH AND CORS/GRAFTS ANGIOGRAPHY;  Surgeon: Burnell Blanks, MD;  Location: [place] [facility];  Service: Cardiovascular;  Laterality: N/A;   LEFT HEART CATH AND CORS/GRAFTS ANGIOGRAPHY N/A 03/02/2021   Procedure: LEFT HEART CATH AND CORS/GRAFTS ANGIOGRAPHY;  Surgeon: Nelva Bush, MD;  Location: [place] [facility];  Service: Cardiovascular;  Laterality: N/A;   Social History:  reports that he quit smoking about 27 years ago. His smoking use included cigarettes. He has a 80.00 pack-year smoking history. He has never used smokeless tobacco. He reports that he does not drink alcohol and does not use drugs.  No Known Allergies  Family History  Problem Relation Age of Onset   Heart disease Other    Heart attack Mother    Colon cancer Neg Hx     Prior to Admission medications   Medication Sig Start Date End Date Taking? Authorizing Provider  albuterol (PROVENTIL) (2.5 MG/3ML) 0.083% nebulizer solution INHALE 1 VIAL VIA NEBULIZER EVERY 6 HOURS AS NEEDED FOR WHEEZING OR SHORT OF BREATH. 06/14/22   Chesley Mires, MD  albuterol (VENTOLIN HFA) 108 (90 Base) MCG/ACT inhaler Inhale 2 puffs into the lungs every 6 (six) hours as needed for wheezing or shortness of breath. 12/31/21   Chesley Mires, MD  alfuzosin (UROXATRAL) 10 MG 24 hr tablet Take 1 tablet (10 mg total) by mouth daily with breakfast. 03/10/22   McKenzie, Candee Furbish, MD  aspirin EC 81 MG tablet Take 81 mg by mouth daily.    [provider]  Budeson-Glycopyrrol-Formoterol (BREZTRI AEROSPHERE) 160-9-4.8 MCG/ACT AERO Inhale 2 puffs into the lungs in the morning and at bedtime. 12/31/21   Chesley Mires, MD  busPIRone (BUSPAR) 10 MG tablet Take 10 mg by mouth 2 (two) times daily. 11/03/18   [provider]  cephALEXin (KEFLEX) 500 MG  capsule Take 500 mg by mouth every 6 (six) hours. 02/11/22   [provider]  ciprofloxacin (CIPRO) 500 MG tablet Take 500 mg by mouth 2 (two) times daily. 03/08/22   [provider]  clopidogrel (PLAVIX) 75 MG tablet Take 1 tablet (75 mg total) by mouth daily. 09/28/21   Satira Sark, MD  doxazosin (CARDURA) 1 MG tablet Take 1 mg by mouth at bedtime. 09/02/20   [provider]  dutasteride (AVODART) 0.5 MG capsule Take 1 capsule (0.5 mg total) by mouth daily. In the afternoon 03/10/22   Cleon Gustin, MD  escitalopram (LEXAPRO) 20 MG tablet Take 20 mg by mouth every morning.     [provider]  furosemide (LASIX) 40 MG tablet Take 1 tablet (40 mg total) by mouth in the morning. Patient taking differently: Take 40 mg by mouth daily. 03/02/21   End, Harrell Gave, MD  hydrOXYzine (ATARAX/VISTARIL) 25 MG tablet Take 25 mg by mouth 2 (two) times daily as needed for anxiety. 11/19/19   [provider]  isosorbide mononitrate (IMDUR) 60 MG 24 hr tablet Take 120 mg by  mouth daily. 02/17/21   [provider]  levothyroxine (SYNTHROID) 75 MCG tablet Take 75 mcg by mouth daily. 02/23/22   [provider]  levothyroxine (SYNTHROID, LEVOTHROID) 50 MCG tablet Take 50 mcg by mouth every morning. 10/04/16   [provider]  meclizine (ANTIVERT) 25 MG tablet Take 25 mg by mouth 4 (four) times daily as needed for dizziness.    [provider]  memantine (NAMENDA) 10 MG tablet Take 10 mg by mouth 2 (two) times daily.  03/05/16   [provider]  MOVANTIK 25 MG TABS tablet Take 25 mg by mouth daily. 08/30/20   [provider]  mupirocin ointment (BACTROBAN) 2 % Apply 1 application topically 3 (three) times daily as needed (affected areas on legs).    [provider]  nitroGLYCERIN (NITROSTAT) 0.4 MG SL tablet Place 1 tablet (0.4 mg total) under the tongue every 5 (five) minutes as needed for chest pain. Pt has  bought nitro bottle from home and oxycodone 30 mg ( 3 or 4 tabs) is in nitro bottle per wife Jhan Conery. Patient taking differently: Place 0.4 mg under the tongue every 5 (five) minutes as needed for chest pain. 11/04/20   Satira Sark, MD  oxycodone (ROXICODONE) 30 MG immediate release tablet Take 30 mg by mouth every 4 (four) hours as needed. 09/02/21   [provider]  pantoprazole (PROTONIX) 40 MG tablet Take 1 tablet (40 mg total) by mouth every morning. 02/16/20   Barton Dubois, MD  potassium chloride SA (K-DUR,KLOR-CON) 20 MEQ tablet Take 20 mEq by mouth daily.     [provider]  QUEtiapine (SEROQUEL) 25 MG tablet Take 25 mg by mouth at bedtime as needed (sleep). 11/19/19   [provider]  ranolazine (RANEXA) 500 MG 12 hr tablet Take 1 tablet (500 mg total) by mouth 2 (two) times daily. 03/23/21   Imogene Burn, PA-C  rosuvastatin (CRESTOR) 40 MG tablet Take 1 tablet (40 mg total) by mouth daily at 6 PM. 12/11/19   Hollice Gong, Mir Earlie Server, MD  sulfamethoxazole-trimethoprim (BACTRIM DS) 800-160 MG tablet Take 1 tablet by mouth every 12 (twelve) hours. 03/10/22   McKenzie, Candee Furbish, MD  tamsulosin (FLOMAX) 0.4 MG CAPS capsule Take 0.4 mg by mouth daily.    [provider]  triamcinolone cream (KENALOG) 0.1 % Apply 1 application topically 2 (two) times daily as needed (To affected areas on legs).    [provider]    Physical Exam: Vitals:   08/09/22 0240 08/09/22 0310 08/09/22 0330 08/09/22 0350  BP: 139/66 (!) 140/65 (!) 141/61 (!) 145/58  Pulse: (!) 57 (!) 51 (!) 54 (!) 58  Resp: '18 17 18 19  '$ Temp:      TempSrc:      SpO2: 93% 93% 96% 93%  Weight:      Height:       Generally: Stable, acutely ill looking, no acute distress HEENT: PERRLA, no pallor no jaundice no rhinorrhea Neck: Supple no JVD no lymphadenopathy Respiratory: Good air entry bilaterally no wheeze rales or crackles Cardiovascular: Sinus bradycardia, no  murmur Abdomen: Soft, nontender, positive bowel sounds Extremities: No edema cyanosis or clubbing Musculoskeletal: No joint swelling or tenderness Neuro: Awake alert oriented nonfocal exam  Data Reviewed:  Initial blood pressure 172/69 with a pulse of 81 respiratory 22.  White count 9.9 hemoglobin 12.9 and platelets 195.  Sodium 136 potassium 3.7 chloride 101 CO2 25 BUN 16 creatinine 1.01 and calcium 9.1 glucose 130.  Initial troponin 25 and then 35.  BNP of 550.  EKG shows normal sinus rhythm with no significant ST changes.  Assessment and Plan:  #1 unstable angina: Patient with known coronary artery disease who is currently on medical management.  Appears to have another episode of angina.  He is currently on heparin drip and feels comfortable.  Also on nitroglycerin drip.  Patient will be admitted to stepdown unit.  Continue to cycle enzymes.  May need to restart Ranexa and Imdur.  Cardiology consulted for any further recommendations.  Current recommendations from previous cardiologist is medical management.  Optimization of his medical therapy prior to discharge.  #2 uncontrolled hypertension: Blood pressure is better now on the nitroglycerin drip.    #3 BPH: Resume home regimen of Flomax.  #4 hypothyroidism: Continue levothyroxine  #5 dementia: Alzheimer's.  Stable.  Continue home regimen of Namenda  #6 COPD: No acute exacerbation     Advance Care Planning:   Code Status: Prior   Consults: [facility] cardiology  Family Communication: None at bedside  Severity of Illness: The appropriate patient status for this patient is INPATIENT. Inpatient status is judged to be reasonable and necessary in order to provide the required intensity of service to ensure the patient's safety. The patient's presenting symptoms, physical exam findings, and initial radiographic and laboratory data in the context of their chronic comorbidities is felt to place them at high risk for  further clinical deterioration. Furthermore, it is not anticipated that the patient will be medically stable for discharge from the hospital within 2 midnights of admission.   * I certify that at the point of admission it is my clinical judgment that the patient will require inpatient hospital care spanning beyond 2 midnights from the point of admission due to high intensity of service, high risk for further deterioration and high frequency of surveillance required.*  AuthorBarbette Merino, MD 08/09/2022 4:32 AM  For on call review [email or web address].

## 2022-08-09 NOTE — Consult Note (Addendum)
Cardiology Consultation:   Patient ID: ELAZAR [name] MRN: [id number]; DOB: [birth year]  Admit date: 08/08/2022 Date of Consult: 08/09/2022  PCP:  [name], MD   [facility] Providers Cardiologist:  Rozann Lesches, MD        Patient Profile:   Dustin [name] is a 88 y.o. male with a hx of CAD who is being seen 08/09/2022 for the evaluation of unstable angina at the request of Dr. Roger Shelter.  History of Present Illness:   Mr. Rill with a history of CAD status post CABG 1996, DES x2 SVG to the PDA 2012 distal disease managed medically, DES to the distal left main 11/2019, 2 out of 5 grafts patent, hyperlipidemia, sinus bradycardia Aricept and beta-blocker stopped, LBBB, alzheimer's dementia.  Cardiac catheterization performed 03/02/2021 and was found to have severe native disease including a 99% mid LAD with competitive flow from the LIMA to the LAD, chronic total circumflex and RCA, widely patent LMCA stent, widely patent LIMA to the LAD, patent SVG to OM1, jump limb to OM 2 chronically occluded, chronically occluded SVG to D2 and SVG to PDA, mildly reduced LVEF 45 to 50%, moderately elevated LVEDP 25 to 30 mmHg.  Recommended increasing Lasix to 40 mg daily and antianginals.  Consider echo to reassess LVEF-never done. Last saw Dr. Domenic Polite 05/2021 and had chronic DOE.  Patient presents with worsening chest pain over the past few weeks days taking oxycodone and NTG. Says he thinks he had an MI several days ago but came in because of SOB. Tried a breathing treatment last night but didn't help. Pain at rest. History unreliable and wife isn't here. Eats out everyday, gets extra salt in diet. Troponins 25, 35, BNP 555. EKG without acute change   Past Medical History:  Diagnosis Date   Alzheimer disease ([place])    Anxiety    Arthritis    Atrophic gastritis    a. By EGD 02/2013.   Carotid artery disease (HCC)    a. mild-mod plaque, <50% stenosis bilat by duplex 2018.   Coronary  atherosclerosis of native coronary artery    a. Multivessel s/p CABG 1996. b. s/p DES x 2 SVG to PDA 8/12 with distal disease managed medically.   DDD (degenerative disc disease)    Chronic back pain   Dementia (HCC)    Enlarged prostate    Essential hypertension    Hematuria    Hypothyroidism    LBBB (left bundle branch block)    MI (myocardial infarction) (HCC)    Mixed hyperlipidemia    OA (osteoarthritis)    OSA (obstructive sleep apnea)    Pneumonia due to COVID-19 virus    February 2021   Sinus bradycardia    a. Aricept and BB discontinued due to this.    Past Surgical History:  Procedure Laterality Date   CATARACT EXTRACTION W/PHACO Left 03/22/2022   Procedure: CATARACT EXTRACTION PHACO AND INTRAOCULAR LENS PLACEMENT (IOC);  Surgeon: Baruch Goldmann, MD;  Location: [facility];  Service: Ophthalmology;  Laterality: Left;  CDE: 25.22   COLONOSCOPY  08/03/2004   Jenkins-numerous large diverticula in the descending, transverse, descending, and sigmoid colon. Otherwise normal exam.   COLONOSCOPY  07/12/2012   RMR: External hemorrhoidal tag; multiple rectal and colonic polyps removed and/or treated as described above. Pancolonic diverticulosis. Bx-tubular adenomas, rectal hyperplastic polyp. next colonoscopy in 06/2015.   COLONOSCOPY N/A 06/22/2016   Procedure: COLONOSCOPY;  Surgeon: Aviva Signs, MD;  Location: [facility];  Service: Gastroenterology;  Laterality: N/A;  77   CORONARY ANGIOPLASTY WITH STENT PLACEMENT     CORONARY ARTERY BYPASS GRAFT  1996   LIMA to LAD, SVG to D2, SVG to PDA, SVG to OM1 and OM2   CORONARY STENT INTERVENTION N/A 12/10/2019   Procedure: CORONARY STENT INTERVENTION;  Surgeon: Burnell Blanks, MD;  Location: [place] [facility];  Service: Cardiovascular;  Laterality: N/A;   ESOPHAGOGASTRODUODENOSCOPY N/A 03/16/2013   Procedure: ESOPHAGOGASTRODUODENOSCOPY (EGD);  Surgeon: Daneil Dolin, MD;  Location: [facility];  Service: Endoscopy;  Laterality:  N/A;  12:00-moved to [place] notified pt   ESOPHAGOGASTRODUODENOSCOPY N/A 03/06/2013   Procedure: ESOPHAGOGASTRODUODENOSCOPY (EGD);  Surgeon: Daneil Dolin, MD;  Location: [facility];  Service: Endoscopy;  Laterality: N/A;   HERNIA REPAIR     INTRAVASCULAR ULTRASOUND/IVUS N/A 12/10/2019   Procedure: Intravascular Ultrasound/IVUS;  Surgeon: Burnell Blanks, MD;  Location: [place] [facility];  Service: Cardiovascular;  Laterality: N/A;   LEFT HEART CATH AND CORS/GRAFTS ANGIOGRAPHY N/A 12/10/2019   Procedure: LEFT HEART CATH AND CORS/GRAFTS ANGIOGRAPHY;  Surgeon: Burnell Blanks, MD;  Location: [place] [facility];  Service: Cardiovascular;  Laterality: N/A;   LEFT HEART CATH AND CORS/GRAFTS ANGIOGRAPHY N/A 03/02/2021   Procedure: LEFT HEART CATH AND CORS/GRAFTS ANGIOGRAPHY;  Surgeon: Nelva Bush, MD;  Location: [place] [facility];  Service: Cardiovascular;  Laterality: N/A;     Home Medications:  Prior to Admission medications   Medication Sig Start Date End Date Taking? Authorizing Provider  albuterol (PROVENTIL) (2.5 MG/3ML) 0.083% nebulizer solution INHALE 1 VIAL VIA NEBULIZER EVERY 6 HOURS AS NEEDED FOR WHEEZING OR SHORT OF BREATH. 06/14/22   Chesley Mires, MD  albuterol (VENTOLIN HFA) 108 (90 Base) MCG/ACT inhaler Inhale 2 puffs into the lungs every 6 (six) hours as needed for wheezing or shortness of breath. 12/31/21   Chesley Mires, MD  alfuzosin (UROXATRAL) 10 MG 24 hr tablet Take 1 tablet (10 mg total) by mouth daily with breakfast. 03/10/22   McKenzie, Candee Furbish, MD  aspirin EC 81 MG tablet Take 81 mg by mouth daily.    [provider]  Budeson-Glycopyrrol-Formoterol (BREZTRI AEROSPHERE) 160-9-4.8 MCG/ACT AERO Inhale 2 puffs into the lungs in the morning and at bedtime. 12/31/21   Chesley Mires, MD  busPIRone (BUSPAR) 10 MG tablet Take 10 mg by mouth 2 (two) times daily. 11/03/18   [provider]  cephALEXin (KEFLEX) 500 MG capsule Take 500 mg by  mouth every 6 (six) hours. 02/11/22   [provider]  ciprofloxacin (CIPRO) 500 MG tablet Take 500 mg by mouth 2 (two) times daily. 03/08/22   [provider]  clopidogrel (PLAVIX) 75 MG tablet Take 1 tablet (75 mg total) by mouth daily. 09/28/21   Satira Sark, MD  doxazosin (CARDURA) 1 MG tablet Take 1 mg by mouth at bedtime. 09/02/20   [provider]  dutasteride (AVODART) 0.5 MG capsule Take 1 capsule (0.5 mg total) by mouth daily. In the afternoon 03/10/22   Cleon Gustin, MD  escitalopram (LEXAPRO) 20 MG tablet Take 20 mg by mouth every morning.     [provider]  furosemide (LASIX) 40 MG tablet Take 1 tablet (40 mg total) by mouth in the morning. Patient taking differently: Take 40 mg by mouth daily. 03/02/21   End, Harrell Gave, MD  hydrOXYzine (ATARAX/VISTARIL) 25 MG tablet Take 25 mg by mouth 2 (two) times daily as needed for anxiety. 11/19/19   [provider]  isosorbide mononitrate (  IMDUR) 60 MG 24 hr tablet Take 120 mg by mouth daily. 02/17/21   [provider]  levothyroxine (SYNTHROID) 75 MCG tablet Take 75 mcg by mouth daily. 02/23/22   [provider]  levothyroxine (SYNTHROID, LEVOTHROID) 50 MCG tablet Take 50 mcg by mouth every morning. 10/04/16   [provider]  meclizine (ANTIVERT) 25 MG tablet Take 25 mg by mouth 4 (four) times daily as needed for dizziness.    [provider]  memantine (NAMENDA) 10 MG tablet Take 10 mg by mouth 2 (two) times daily.  03/05/16   [provider]  MOVANTIK 25 MG TABS tablet Take 25 mg by mouth daily. 08/30/20   [provider]  mupirocin ointment (BACTROBAN) 2 % Apply 1 application topically 3 (three) times daily as needed (affected areas on legs).    [provider]  nitroGLYCERIN (NITROSTAT) 0.4 MG SL tablet Place 1 tablet (0.4 mg total) under the tongue every 5 (five) minutes as needed for chest pain. Pt has bought nitro bottle from  home and oxycodone 30 mg ( 3 or 4 tabs) is in nitro bottle per wife Freddrick Gladson. Patient taking differently: Place 0.4 mg under the tongue every 5 (five) minutes as needed for chest pain. 11/04/20   Satira Sark, MD  oxycodone (ROXICODONE) 30 MG immediate release tablet Take 30 mg by mouth every 4 (four) hours as needed. 09/02/21   [provider]  pantoprazole (PROTONIX) 40 MG tablet Take 1 tablet (40 mg total) by mouth every morning. 02/16/20   Barton Dubois, MD  potassium chloride SA (K-DUR,KLOR-CON) 20 MEQ tablet Take 20 mEq by mouth daily.     [provider]  QUEtiapine (SEROQUEL) 25 MG tablet Take 25 mg by mouth at bedtime as needed (sleep). 11/19/19   [provider]  ranolazine (RANEXA) 500 MG 12 hr tablet Take 1 tablet (500 mg total) by mouth 2 (two) times daily. 03/23/21   Imogene Burn, PA-C  rosuvastatin (CRESTOR) 40 MG tablet Take 1 tablet (40 mg total) by mouth daily at 6 PM. 12/11/19   Hollice Gong, Mir Earlie Server, MD  sulfamethoxazole-trimethoprim (BACTRIM DS) 800-160 MG tablet Take 1 tablet by mouth every 12 (twelve) hours. 03/10/22   McKenzie, Candee Furbish, MD  tamsulosin (FLOMAX) 0.4 MG CAPS capsule Take 0.4 mg by mouth daily.    [provider]  triamcinolone cream (KENALOG) 0.1 % Apply 1 application topically 2 (two) times daily as needed (To affected areas on legs).    [provider]    Inpatient Medications: Scheduled Meds:  albuterol  2.5 mg Nebulization QID   clopidogrel  75 mg Oral Daily   furosemide  40 mg Intravenous Once   isosorbide mononitrate  120 mg Oral Daily   levothyroxine  75 mcg Oral Daily   memantine  10 mg Oral BID   potassium chloride  20 mEq Oral Once   ranolazine  500 mg Oral BID   rosuvastatin  40 mg Oral q1800   Continuous Infusions:  heparin 1,000 Units/hr (08/09/22 0032)   nitroGLYCERIN 10 mcg/min (08/09/22 0042)   PRN Meds: acetaminophen, albuterol, ondansetron (ZOFRAN) IV,  oxycodone  Allergies:   No Known Allergies  Social History:   Social History   Tobacco Use   Smoking status: Former    Packs/day: 2.00    Years: 40.00    Total pack years: 80.00    Types: Cigarettes    Quit date: 12/27/1994    Years since quitting: 27.6  Smokeless tobacco: Never  Substance Use Topics   Alcohol use: No    Alcohol/week: 0.0 standard drinks of alcohol     Family History:     Family History  Problem Relation Age of Onset   Heart disease Other    Heart attack Mother    Colon cancer Neg Hx      ROS:  Please see the history of present illness.  Review of Systems  Reason unable to perform ROS: dementia.  Constitutional: Negative.  HENT: Negative.    Cardiovascular:  Positive for chest pain and dyspnea on exertion.  Respiratory:  Positive for shortness of breath.   Endocrine: Negative.   Hematologic/Lymphatic: Negative.   Musculoskeletal:  Positive for muscle cramps.  Gastrointestinal: Negative.   Genitourinary: Negative.   Neurological: Negative.     All other ROS reviewed and negative.     Physical Exam/Data:   Vitals:   08/09/22 0606 08/09/22 0609 08/09/22 0630 08/09/22 0700  BP:   93/62 (!) 145/63  Pulse:   73 (!) 57  Resp:   15 18  Temp:  98.1 F (36.7 C)    TempSrc:  Oral    SpO2: 96%  98% (!) 89%  Weight:      Height:       No intake or output data in the 24 hours ending 08/09/22 0841    08/09/2022   12:07 AM 04/14/2022    3:49 PM 03/22/2022   12:34 PM  Last 3 Weights  Weight (lbs) 218 lb 210 lb 1.6 oz 210 lb  Weight (kg) 98.884 kg 95.3 kg 95.255 kg     Body mass index is 31.28 kg/m.  General:  obese, in no acute distress  HEENT: normal Neck: no JVD Vascular: No carotid bruits; Distal pulses 2+ bilaterally Cardiac:  normal S1, S2; RRR; no murmur   Lungs:  clear to auscultation at bases but wheezing anteriorly Abd: distended, hernia, soft, nontender, no hepatomegaly  Ext: no edema Musculoskeletal:  No deformities, BUE and BLE  strength normal and equal Skin: warm and dry  Neuro:  CNs 2-12 intact, dementia Psych:  Normal affect   EKG:  The EKG was personally reviewed and demonstrates:  NSR with PVC's, IVCD nonspecific ST changes Telemetry:  Telemetry was personally reviewed and demonstrates:  NSR with PVC's  Relevant CV Studies:   Cardiac catheterization 03/02/2021: Conclusions: Severe native coronary artery disease. Including 99% mid LAD stenosis with competitive flow from LIMA-LAD, as well as chronic total occlusions of proximal LCx and mid RCA. Widely patent LMCA stent. Widely patent LIMA-LAD. Patent SVG-OM1 with minimal liminal irregularities.  Jump limb to OM2 is chronically occluded. Chronically occlused SVG-D2 and SVG-rPDA. Mildly reduced left ventricular contraction with global hypokinesis (LVEF 45-50%). Moderately elevated left ventricular filling pressure (LVEDP 25-30 mmHg).   Recommendations: Escalate furosemide to 40 mg daily. Consider further escalation of antianginal therapy.  No interventional targets identified on today's catheterization. Consider echo to reassess LVEF and exclude other structural abnormalities that could be contributing to symptoms. Continue aggressive secondary prevention of coronary artery disease.   Laboratory Data:  High Sensitivity Troponin:   Recent Labs  Lab 08/09/22 0011 08/09/22 0206 08/09/22 0716  TROPONINIHS 25* 35* 35*     Chemistry Recent Labs  Lab 08/09/22 0011  NA 136  K 3.7  CL 101  CO2 25  GLUCOSE 130*  BUN 16  CREATININE 1.01  CALCIUM 9.1  GFRNONAA >60  ANIONGAP 10    Hematology Recent Labs  Lab  08/09/22 0011  WBC 9.9  RBC 4.12*  HGB 12.9*  HCT 39.0  MCV 94.7  MCH 31.3  MCHC 33.1  RDW 13.6  PLT 195    BNP Recent Labs  Lab 08/09/22 0011  BNP 550.0*     Radiology/Studies:  DG Chest Port 1 View  Result Date: 08/09/2022 CLINICAL DATA:  Chest pain. EXAM: PORTABLE CHEST 1 VIEW COMPARISON:  July 31, 2021 FINDINGS:  Multiple sternal wires and vascular clips are seen. The cardiac silhouette is mildly enlarged and unchanged in size. Mild, diffusely increased interstitial lung markings are seen. Mild prominence of the perihilar pulmonary vasculature is also noted. Mild, stable bibasilar atelectasis is present. There is no evidence of focal consolidation, pleural effusion or pneumothorax. Multilevel degenerative changes are seen throughout the thoracic spine. IMPRESSION: 1. Evidence of prior median sternotomy/CABG. 2. Mild pulmonary vascular congestion. 3. Mild, stable bibasilar atelectasis. Electronically Signed   By: Virgina Norfolk M.D.   On: 08/09/2022 00:35     Assessment and Plan:   Chest pain and shortness of breath consistent with angina/mild CHF now comfortable on IV heparin and NTG. Cath last year with recommended medical therapy. Given age, dementia, flat troponins will try medical therapy and adjust meds and diurese.check echo.  CAD status post CABG 1996, DES x2 SVG to the PDA 2012 distal disease managed medically, DES to the distal left main 11/2019, 2 out of 5 grafts patent, cath for increasing angina 03/02/2021 see above for details medical therapy recommended elevated LVEDP and mildly reduced LVEF 45 to 50% Lasix increased to 40 mg daily and recommend follow-up echo but never done  Ischemic cardiomyopathy ejection fraction 45 to 50% at time of cath. He is getting a lot of salt in his diet and eating out daily.  2 g sodium diet. Repeat echo and give dose IV lasix   Essential hypertension blood pressure controlled   Hyperlipidemia continue Crestor  Risk Assessment/Risk Scores:     HEAR Score (for undifferentiated chest pain):     New York Heart Association (NYHA) Functional Class NYHA Class II        For questions or updates, please contact [facility] Please consult [email or web address] for contact info under    Signed, Ermalinda Barrios, PA-C 08/09/2022 8:41 AM    Attending note:  Patient  seen and examined.  I agree with above assessment by Ms. Bonnell Public PA-C.  Mr. Stemm is a patient of mine, last seen in July 2022 he has a history of multivessel CAD status post CABG with documented native vessel and graft disease by cardiac catheterization last year, no good culprits for revascularization and plan to manage medically.  He presents now with a few week history of worsening chest discomfort and shortness of breath, also chronic pain.  He has been taking oxycodone and also intermittent nitroglycerin.  On examination he is currently pain-free on IV heparin and nitroglycerin.  Afebrile.  Heart rate in the 60s in sinus rhythm by telemetry which I personally reviewed.  Systolic 401U to 272Z.  Lungs exhibit a few crackles at the bases, no wheezing.  Breathing nonlabored.  Cardiac exam with RRR and 1/6 systolic murmur, no gallop.  No peripheral edema noted.  Pertinent lab work includes potassium 3.7, BUN 16, creatinine 1.01, peak high-sensitivity troponin I of 35 with flat pattern, BNP 550, hemoglobin 12.9, platelets 195.  Chest x-ray reports mild pulmonary vascular congestion and bibasilar atelectasis.  I personally reviewed his ECG which shows sinus rhythm with IVCD, repolarization abnormalities  with more prominent ST segment changes compared to last tracing, also PVCs.  Patient presents with unstable angina although high-sensitivity troponin I levels are not suggestive of clear ACS, BNP also moderately elevated.  He did have elevated LVEDP at his heart catheterization last year, but no clear culprits for revascularization at that time.  Would recommend attempt at medical therapy for now, likely 48 hours of IV heparin.  Check echocardiogram to follow-up LVEF and can then address adjustments in GDMT.  Continue Plavix, Imdur, Ranexa, and Crestor.  We will continue to follow with you.  Satira Sark, M.D., F.A.C.C.

## 2022-08-09 NOTE — ED Notes (Signed)
Pt states he feels like he is having a hard time breathing and would like a breathing treatment. Vitals stable. States he does them at home as needed. Dr. Jonelle Sidle made aware, awaiting orders.

## 2022-08-10 DIAGNOSIS — I200 Unstable angina: Secondary | ICD-10-CM | POA: Diagnosis not present

## 2022-08-10 LAB — CBC
HCT: 34.3 % — ABNORMAL LOW (ref 39.0–52.0)
Hemoglobin: 11.3 g/dL — ABNORMAL LOW (ref 13.0–17.0)
MCH: 31 pg (ref 26.0–34.0)
MCHC: 32.9 g/dL (ref 30.0–36.0)
MCV: 94.2 fL (ref 80.0–100.0)
Platelets: 172 10*3/uL (ref 150–400)
RBC: 3.64 MIL/uL — ABNORMAL LOW (ref 4.22–5.81)
RDW: 13.6 % (ref 11.5–15.5)
WBC: 7.8 10*3/uL (ref 4.0–10.5)
nRBC: 0 % (ref 0.0–0.2)

## 2022-08-10 LAB — MRSA NEXT GEN BY PCR, NASAL: MRSA by PCR Next Gen: NOT DETECTED

## 2022-08-10 LAB — HEPARIN LEVEL (UNFRACTIONATED): Heparin Unfractionated: 0.16 IU/mL — ABNORMAL LOW (ref 0.30–0.70)

## 2022-08-10 MED ORDER — POLYETHYLENE GLYCOL 3350 17 G PO PACK
17.0000 g | PACK | Freq: Every day | ORAL | Status: DC
  Administered 2022-08-10: 17 g via ORAL
  Filled 2022-08-10 (×2): qty 1

## 2022-08-10 MED ORDER — ISOSORBIDE MONONITRATE ER 60 MG PO TB24
60.0000 mg | ORAL_TABLET | Freq: Two times a day (BID) | ORAL | Status: DC
  Administered 2022-08-10 (×2): 60 mg via ORAL
  Filled 2022-08-10 (×3): qty 1

## 2022-08-10 MED ORDER — SPIRONOLACTONE 12.5 MG HALF TABLET
12.5000 mg | ORAL_TABLET | Freq: Every day | ORAL | Status: DC
  Administered 2022-08-10: 12.5 mg via ORAL
  Filled 2022-08-10 (×2): qty 1

## 2022-08-10 MED ORDER — ALBUTEROL SULFATE (2.5 MG/3ML) 0.083% IN NEBU
2.5000 mg | INHALATION_SOLUTION | Freq: Two times a day (BID) | RESPIRATORY_TRACT | Status: DC
Start: 2022-08-10 — End: 2022-08-11
  Administered 2022-08-10 – 2022-08-11 (×2): 2.5 mg via RESPIRATORY_TRACT
  Filled 2022-08-10 (×2): qty 3

## 2022-08-10 MED ORDER — HEPARIN BOLUS VIA INFUSION
2000.0000 [IU] | Freq: Once | INTRAVENOUS | Status: AC
  Administered 2022-08-10: 2000 [IU] via INTRAVENOUS
  Filled 2022-08-10: qty 2000

## 2022-08-10 NOTE — Progress Notes (Signed)
Progress Note  Patient Name: Dustin [name] Date of Encounter: 08/10/2022  Primary Cardiologist: Rozann Lesches, MD  Subjective   No chest pain since late yesterday.  No shortness of breath this morning.  Inpatient Medications    Scheduled Meds:  albuterol  2.5 mg Nebulization QID   Chlorhexidine Gluconate Cloth  6 each Topical Daily   clopidogrel  75 mg Oral Daily   levothyroxine  75 mcg Oral Daily   memantine  10 mg Oral BID   ranolazine  500 mg Oral BID   rosuvastatin  40 mg Oral q1800   Continuous Infusions:  heparin 1,450 Units/hr (08/10/22 0659)   nitroGLYCERIN Stopped (08/10/22 0049)   PRN Meds: acetaminophen, albuterol, ondansetron (ZOFRAN) IV, oxycodone   Vital Signs    Vitals:   08/10/22 0455 08/10/22 0500 08/10/22 0600 08/10/22 0842  BP:  (!) 158/51 (!) 141/53   Pulse:      Resp:  (!) 23 15   Temp: 98.6 F (37 C)     TempSrc: Oral     SpO2:  99%  99%  Weight: 96.6 kg     Height:        Intake/Output Summary (Last 24 hours) at 08/10/2022 0842 Last data filed at 08/10/2022 0659 Gross per 24 hour  Intake 538.24 ml  Output 580 ml  Net -41.76 ml   Filed Weights   08/09/22 0007 08/09/22 1727 08/10/22 0455  Weight: 98.9 kg 95.8 kg 96.6 kg    Telemetry    Sinus rhythm.  Personally reviewed.  ECG    An ECG dated 08/09/2022 was personally reviewed today and demonstrated:  Sinus rhythm with PVC, IVCD and repolarization abnormalities.  Physical Exam   GEN: No acute distress.   Neck: No JVD. Cardiac: RRR, 1/6 systolic murmur, no gallop.  Respiratory: Nonlabored. Clear to auscultation bilaterally. GI: Soft, nontender, bowel sounds present. MS: No edema; No deformity. Neuro:  Nonfocal.  Labs    Chemistry Recent Labs  Lab 08/09/22 0011  NA 136  K 3.7  CL 101  CO2 25  GLUCOSE 130*  BUN 16  CREATININE 1.01  CALCIUM 9.1  GFRNONAA >60  ANIONGAP 10     Hematology Recent Labs  Lab 08/09/22 0011 08/10/22 0349  WBC 9.9 7.8  RBC  4.12* 3.64*  HGB 12.9* 11.3*  HCT 39.0 34.3*  MCV 94.7 94.2  MCH 31.3 31.0  MCHC 33.1 32.9  RDW 13.6 13.6  PLT 195 172    Cardiac Enzymes Recent Labs  Lab 08/09/22 0011 08/09/22 0206 08/09/22 0716 08/09/22 0848  TROPONINIHS 25* 35* 35* 31*    BNP Recent Labs  Lab 08/09/22 0011  BNP 550.0*     Radiology    ECHOCARDIOGRAM COMPLETE  Result Date: 08/09/2022    ECHOCARDIOGRAM REPORT   Patient Name:   Dustin [name] Date of Exam: 08/09/2022 Medical Rec #:  [id number]       Height:       70.0 in Accession #:    [id number]      Weight:       218.0 lb Date of Birth:  February 11, 1934       BSA:          2.165 m Patient Age:    88 years        BP:           144/59 mmHg Patient Gender: M               HR:  67 bpm. Exam Location:  [facility] Procedure: 2D Echo, Cardiac Doppler and Color Doppler Indications:    Chest Pain  History:        Patient has prior history of Echocardiogram examinations, most                 recent 12/10/2019. CHF, CAD and Previous Myocardial Infarction,                 COPD, Arrythmias:Bradycardia, Signs/Symptoms:Chest Pain and                 Alzheimer's; Risk Factors:Hypertension and Dyslipidemia.  Sonographer:    Wenda Low Referring Phys: [place]  1. Left ventricular ejection fraction, by estimation, is 50 to 55%. The left ventricle has low normal function. The left ventricle demonstrates regional wall motion abnormalities (see scoring diagram/findings for description). The left ventricular internal cavity size was mildly dilated. There is moderate left ventricular hypertrophy. Left ventricular diastolic parameters are consistent with Grade II diastolic dysfunction (pseudonormalization).  2. Right ventricular systolic function is low normal. The right ventricular size is normal. There is mildly elevated pulmonary artery systolic pressure. The estimated right ventricular systolic pressure is 09.6 mmHg.  3. Left atrial size was moderately  dilated.  4. The mitral valve is degenerative. Mild mitral valve regurgitation. The mean mitral valve gradient is 2.0 mmHg.  5. The aortic valve is tricuspid. Aortic valve regurgitation is not visualized. Aortic valve sclerosis/calcification is present, without any evidence of aortic stenosis. Aortic valve mean gradient measures 7.5 mmHg.  6. The inferior vena cava is normal in size with <50% respiratory variability, suggesting right atrial pressure of 8 mmHg. Comparison(s): Prior images reviewed side by side. LVEF low normal range at 50-55% with basal inferoposterior hypokinesis. Mildly increased RVSP. FINDINGS  Left Ventricle: Left ventricular ejection fraction, by estimation, is 50 to 55%. The left ventricle has low normal function. The left ventricle demonstrates regional wall motion abnormalities. The left ventricular internal cavity size was mildly dilated. There is moderate left ventricular hypertrophy. Left ventricular diastolic parameters are consistent with Grade II diastolic dysfunction (pseudonormalization).  LV Wall Scoring: The basal inferolateral segment and basal inferior segment are hypokinetic. The entire anterior wall, antero-lateral wall, mid and distal lateral wall, entire septum, entire apex, and mid and distal inferior wall are normal. Right Ventricle: The right ventricular size is normal. No increase in right ventricular wall thickness. Right ventricular systolic function is low normal. There is mildly elevated pulmonary artery systolic pressure. The tricuspid regurgitant velocity is 2.71 m/s, and with an assumed right atrial pressure of 8 mmHg, the estimated right ventricular systolic pressure is 28.3 mmHg. Left Atrium: Left atrial size was moderately dilated. Right Atrium: Right atrial size was normal in size. Pericardium: There is no evidence of pericardial effusion. Mitral Valve: The mitral valve is degenerative in appearance. There is mild thickening of the mitral valve leaflet(s). Mild  mitral annular calcification. Mild mitral valve regurgitation. MV peak gradient, 7.4 mmHg. The mean mitral valve gradient is 2.0 mmHg. Tricuspid Valve: The tricuspid valve is grossly normal. Tricuspid valve regurgitation is trivial. Aortic Valve: The aortic valve is tricuspid. There is moderate aortic valve annular calcification. Aortic valve regurgitation is not visualized. Aortic valve sclerosis/calcification is present, without any evidence of aortic stenosis. Aortic valve mean gradient measures 7.5 mmHg. Aortic valve peak gradient measures 13.4 mmHg. Aortic valve area, by VTI measures 2.01 cm. Pulmonic Valve: The pulmonic valve was grossly normal. Pulmonic valve regurgitation  is mild. Aorta: The aortic root is normal in size and structure. Venous: The inferior vena cava is normal in size with less than 50% respiratory variability, suggesting right atrial pressure of 8 mmHg. IAS/Shunts: No atrial level shunt detected by color flow Doppler.  LEFT VENTRICLE PLAX 2D LVIDd:         5.90 cm     Diastology LVIDs:         5.00 cm     LV e' medial:    5.22 cm/s LV PW:         1.30 cm     LV E/e' medial:  22.2 LV IVS:        1.50 cm     LV e' lateral:   6.85 cm/s LVOT diam:     2.00 cm     LV E/e' lateral: 16.9 LV SV:         95 LV SV Index:   44 LVOT Area:     3.14 cm  LV Volumes (MOD) LV vol d, MOD A2C: 93.1 ml LV vol d, MOD A4C: 68.8 ml LV vol s, MOD A2C: 46.4 ml LV vol s, MOD A4C: 29.7 ml LV SV MOD A2C:     46.7 ml LV SV MOD A4C:     68.8 ml LV SV MOD BP:      40.1 ml RIGHT VENTRICLE RV Basal diam:  3.60 cm RV Mid diam:    3.30 cm RV S prime:     11.00 cm/s TAPSE (M-mode): 2.7 cm LEFT ATRIUM           Index        RIGHT ATRIUM           Index LA diam:      4.50 cm 2.08 cm/m   RA Area:     22.20 cm LA Vol (A2C): 47.1 ml 21.75 ml/m  RA Volume:   62.70 ml  28.96 ml/m LA Vol (A4C): 91.6 ml 42.31 ml/m  AORTIC VALVE                     PULMONIC VALVE AV Area (Vmax):    2.06 cm      PV Vmax:       0.93 m/s AV Area  (Vmean):   2.06 cm      PV Peak grad:  3.5 mmHg AV Area (VTI):     2.01 cm AV Vmax:           183.00 cm/s AV Vmean:          132.000 cm/s AV VTI:            0.470 m AV Peak Grad:      13.4 mmHg AV Mean Grad:      7.5 mmHg LVOT Vmax:         120.00 cm/s LVOT Vmean:        86.500 cm/s LVOT VTI:          0.301 m LVOT/AV VTI ratio: 0.64  AORTA Ao Root diam: 3.40 cm Ao Asc diam:  2.90 cm MITRAL VALVE                TRICUSPID VALVE MV Area (PHT): 3.51 cm     TR Peak grad:   29.4 mmHg MV Area VTI:   1.98 cm     TR Vmax:        271.00 cm/s MV Peak grad:  7.4 mmHg MV Mean grad:  2.0 mmHg     SHUNTS MV Vmax:       1.36 m/s     Systemic VTI:  0.30 m MV Vmean:      51.8 cm/s    Systemic Diam: 2.00 cm MV Decel Time: 216 msec MV E velocity: 116.00 cm/s MV A velocity: 97.30 cm/s MV E/A ratio:  1.19 Rozann Lesches MD Electronically signed by Rozann Lesches MD Signature Date/Time: 08/09/2022/12:59:18 PM    Final    DG Chest Port 1 View  Result Date: 08/09/2022 CLINICAL DATA:  Chest pain. EXAM: PORTABLE CHEST 1 VIEW COMPARISON:  July 31, 2021 FINDINGS: Multiple sternal wires and vascular clips are seen. The cardiac silhouette is mildly enlarged and unchanged in size. Mild, diffusely increased interstitial lung markings are seen. Mild prominence of the perihilar pulmonary vasculature is also noted. Mild, stable bibasilar atelectasis is present. There is no evidence of focal consolidation, pleural effusion or pneumothorax. Multilevel degenerative changes are seen throughout the thoracic spine. IMPRESSION: 1. Evidence of prior median sternotomy/CABG. 2. Mild pulmonary vascular congestion. 3. Mild, stable bibasilar atelectasis. Electronically Signed   By: Virgina Norfolk M.D.   On: 08/09/2022 00:35     Assessment & Plan    1.  Unstable angina, at this point symptomatically stable.  High-sensitivity troponin levels are in the 20-30 range and left are not suggestive of ACS.  Has been on IV heparin, IV nitroglycerin  discontinued.  2.  CAD status post CABG with native vessel and graft disease with cardiac catheterization in March 2022 felt to be best managed medically without optimal revascularization options.  Follow-up LVEF 50 to 55% by echocardiogram, also moderate diastolic dysfunction.  3.  History of bradycardia on beta-blocker and Aricept.  4.  Mixed hyperlipidemia, on Crestor.  5.  Dementia, on Namenda.  Anticipate 48-hour total course of IV heparin.  Resume Imdur given discontinuation of IV nitroglycerin and otherwise continue Plavix, Ranexa, and Crestor.  Hold off on beta-blocker given prior history of bradycardia.  Start Aldactone.  Not optimal candidate for SGLT2 inhibitor history of UTIs.  If remains clinically stable.  Likely discharge home tomorrow.  Signed, Rozann Lesches, MD  08/10/2022, 8:42 AM

## 2022-08-10 NOTE — Progress Notes (Signed)
PROGRESS NOTE    Patient: Dustin [name]                            PCP: [name], MD                    DOB: 12-09-1934            DOA: 08/08/2022 UXL:[id number]             DOS: 08/10/2022, 11:05 AM   LOS: 1 day   Date of Service: The patient was seen and examined on 08/10/2022  Subjective:   The patient was seen and examined this morning, reporting improved chest pain since yesterday, no shortness of breath.  Brief Narrative:   KLEBER CREAN is a 88 y.o. male with medical history significant of coronary artery disease status post multiple stents and CABG in 1996 with the last About a year ago recommendations for medical management, essential hypertension, hypothyroidism, Alzheimer's dementia, hyperlipidemia, BPH, depression with anxiety and degenerative disc disease who presented to the ER with escalating progressive chest pain and shortness of breath.  Chest pain was substernal and consistent with his previous MIs.  No significant radiation.  He took his nitroglycerin at home with no significant relief.   Patient has been on Imdur and Ranexa, not clear if he has been taking these medicines lately.  Initial troponin was 30 and then 25.  No significant EKG changes.   Case discussed with cardiology at [facility] who recommends admission here with cardiology evaluation.  We are therefore admitting the patient with unstable angina initiating heparin and nitroglycerin drip.   Assessment and Plan:   Principal Problem:   Unstable angina (HCC) Active Problems:   Hyperlipidemia   Alzheimer disease ([place])   Hypothyroidism   Benign prostatic hyperplasia with urinary obstruction   COPD (chronic obstructive pulmonary disease) (HCC)  Unstable angina:  -Angina has improved, Troponin remained flat 20-30s--no indication of acute ACS -He has been on heparin drip and nitroglycerin drip---anticipating being discontinued by cardiology today -Known CAD, on medical management  -Currently  cardiology recommending medical management  CAD status post CABG 1996, DES x2 SVG to the PDA 2012 distal disease managed medically, DES to the distal left main 11/2019, 2 out of 5 grafts patent,   Cath -03/02/2021- LVEDP and mildly reduced LVEF 45 to 50% Lasix increased to 40 mg daily -ischemic cardiomyopathy  Repeat echo: - -Continue Plavix, Imdur -dissipating to be restarted today -Continue Ranexa, and Crestor   Patient apparently not a candidate for SGLT2 inhibitor due to history of UTIs   Uncontrolled hypertension:  Restart oral medication, discontinue nitroglycerin drip BP was elevated on admission, improving   BPH: Resume home regimen of Flomax.   Hypothyroidism: Continue levothyroxine   Dementia: Alzheimer's.  Stable.  Continue home regimen of Namenda   COPD: No acute exacerbation      Skin Assessment: I have examined the patient's skin and I agree with the wound assessment as performed by wound care team As outlined belowe: Pressure Injury 02/15/17 Deep Tissue Injury - Purple or maroon localized area of discolored intact skin or blood-filled blister due to damage of underlying soft tissue from pressure and/or shear. maroon color localized area on right buttocks (Active)  02/15/17 1000  Location: Buttocks  Location Orientation: Right  Staging: Deep Tissue Injury - Purple or maroon localized area of discolored intact skin or blood-filled blister due to damage of underlying  soft tissue from pressure and/or shear.  Wound Description (Comments): maroon color localized area on right buttocks  Present on Admission: No    -----------------------------------------------------------------------------------------------------------------------------------  DVT prophylaxis:     Code Status:   Code Status: Full Code  Family Communication: No family member present at bedside- attempt will be made to update daily The above findings and plan of care has been discussed with  patient (and family)  in detail,  they expressed understanding and agreement of above. -Advance care planning has been discussed.   Admission status:   Status is: Inpatient Remains inpatient appropriate because: Needing continue close evaluation by cardiology  Disposition: Anticipating discharge in next 48 hours after medicine modification by cardiology today. Discontinuing IV heparin, nitroglycerin drip, restarting Imdur holding beta-blockers starting Aldactone,    Procedures:   No admission procedures for hospital encounter.   Antimicrobials:  Anti-infectives (From admission, onward)    None        Medication:   albuterol  2.5 mg Nebulization QID   Chlorhexidine Gluconate Cloth  6 each Topical Daily   clopidogrel  75 mg Oral Daily   isosorbide mononitrate  60 mg Oral BID   levothyroxine  75 mcg Oral Daily   memantine  10 mg Oral BID   ranolazine  500 mg Oral BID   rosuvastatin  40 mg Oral q1800   spironolactone  12.5 mg Oral Daily    acetaminophen, albuterol, ondansetron (ZOFRAN) IV, oxycodone   Objective:   Vitals:   08/10/22 0700 08/10/22 0842 08/10/22 0900 08/10/22 1000  BP: (!) 138/57  (!) 120/35 (!) 133/48  Pulse:   65 (!) 58  Resp: '15  16 13  '$ Temp:      TempSrc:      SpO2:  99% 96% 92%  Weight:      Height:        Intake/Output Summary (Last 24 hours) at 08/10/2022 1105 Last data filed at 08/10/2022 0800 Gross per 24 hour  Intake 395.43 ml  Output 580 ml  Net -184.57 ml   Filed Weights   08/09/22 0007 08/09/22 1727 08/10/22 0455  Weight: 98.9 kg 95.8 kg 96.6 kg     Examination:      Physical Exam:   General:  AAO x 3,  cooperative, no distress;   HEENT:  Normocephalic, PERRL, otherwise with in Normal limits   Neuro:  CNII-XII intact. , normal motor and sensation, reflexes intact   Lungs:   Clear to auscultation BL, Respirations unlabored,  No wheezes / crackles  Cardio:    S1/S2, RRR, No murmure, No Rubs or Gallops   Abdomen:   Soft, non-tender, bowel sounds active all four quadrants, no guarding or peritoneal signs.  Muscular  skeletal:  Limited exam -global generalized weaknesses - in bed, able to move all 4 extremities,   2+ pulses,  symmetric, No pitting edema  Skin:  Dry, warm to touch, negative for any Rashes,  Wounds: Please see nursing documentation  Pressure Injury 02/15/17 Deep Tissue Injury - Purple or maroon localized area of discolored intact skin or blood-filled blister due to damage of underlying soft tissue from pressure and/or shear. maroon color localized area on right buttocks (Active)  02/15/17 1000  Location: Buttocks  Location Orientation: Right  Staging: Deep Tissue Injury - Purple or maroon localized area of discolored intact skin or blood-filled blister due to damage of underlying soft tissue from pressure and/or shear.  Wound Description (Comments): maroon color localized area on right buttocks  Present  on Admission: No           ------------------------------------------------------------------------------------------------------------------------------------------    LABs:     Latest Ref Rng & Units 08/10/2022    3:49 AM 08/09/2022   12:11 AM 08/03/2021    2:04 AM  CBC  WBC 4.0 - 10.5 K/uL 7.8  9.9  4.3   Hemoglobin 13.0 - 17.0 g/dL 11.3  12.9  12.0   Hematocrit 39.0 - 52.0 % 34.3  39.0  37.0   Platelets 150 - 400 K/uL 172  195  182       Latest Ref Rng & Units 08/09/2022   12:11 AM 08/03/2021    2:04 AM 08/02/2021    1:36 AM  CMP  Glucose 70 - 99 mg/dL 130  134  143   BUN 8 - 23 mg/dL '16  16  17   '$ Creatinine 0.61 - 1.24 mg/dL 1.01  0.95  0.97   Sodium 135 - 145 mmol/L 136  137  138   Potassium 3.5 - 5.1 mmol/L 3.7  4.0  3.6   Chloride 98 - 111 mmol/L 101  105  105   CO2 22 - 32 mmol/L '25  26  26   '$ Calcium 8.9 - 10.3 mg/dL 9.1  8.4  8.4   Total Protein 6.5 - 8.1 g/dL  5.5  5.6   Total Bilirubin 0.3 - 1.2 mg/dL  0.5  0.7   Alkaline Phos 38 - 126 U/L  31  35   AST 15 -  41 U/L  20  28   ALT 0 - 44 U/L  18  21        Micro Results Recent Results (from the past 240 hour(s))  MRSA Next Gen by PCR, Nasal     Status: None   Collection Time: 08/09/22  5:30 PM   Specimen: Nasal Mucosa; Nasal Swab  Result Value Ref Range Status   MRSA by PCR Next Gen NOT DETECTED NOT DETECTED Final    Comment: (NOTE) The GeneXpert MRSA Assay (FDA approved for NASAL specimens only), is one component of a comprehensive MRSA colonization surveillance program. It is not intended to diagnose MRSA infection nor to guide or monitor treatment for MRSA infections. Test performance is not FDA approved in patients less than 54 years old. Performed at [facility], [address], [place],  [address]     Radiology Reports ECHOCARDIOGRAM COMPLETE  Result Date: 08/09/2022    ECHOCARDIOGRAM REPORT   Patient Name:   HEZAKIAH CHAMPEAU Date of Exam: 08/09/2022 Medical Rec #:  [id number]       Height:       70.0 in Accession #:    [id number]      Weight:       218.0 lb Date of Birth:  26-Nov-1934       BSA:          2.165 m Patient Age:    7 years        BP:           144/59 mmHg Patient Gender: M               HR:           67 bpm. Exam Location:  [facility] Procedure: 2D Echo, Cardiac Doppler and Color Doppler Indications:    Chest Pain  History:        Patient has prior history of Echocardiogram examinations, most  recent 12/10/2019. CHF, CAD and Previous Myocardial Infarction,                 COPD, Arrythmias:Bradycardia, Signs/Symptoms:Chest Pain and                 Alzheimer's; Risk Factors:Hypertension and Dyslipidemia.  Sonographer:    Wenda Low Referring Phys: [place]  1. Left ventricular ejection fraction, by estimation, is 50 to 55%. The left ventricle has low normal function. The left ventricle demonstrates regional wall motion abnormalities (see scoring diagram/findings for description). The left ventricular internal cavity size was  mildly dilated. There is moderate left ventricular hypertrophy. Left ventricular diastolic parameters are consistent with Grade II diastolic dysfunction (pseudonormalization).  2. Right ventricular systolic function is low normal. The right ventricular size is normal. There is mildly elevated pulmonary artery systolic pressure. The estimated right ventricular systolic pressure is 63.3 mmHg.  3. Left atrial size was moderately dilated.  4. The mitral valve is degenerative. Mild mitral valve regurgitation. The mean mitral valve gradient is 2.0 mmHg.  5. The aortic valve is tricuspid. Aortic valve regurgitation is not visualized. Aortic valve sclerosis/calcification is present, without any evidence of aortic stenosis. Aortic valve mean gradient measures 7.5 mmHg.  6. The inferior vena cava is normal in size with <50% respiratory variability, suggesting right atrial pressure of 8 mmHg. Comparison(s): Prior images reviewed side by side. LVEF low normal range at 50-55% with basal inferoposterior hypokinesis. Mildly increased RVSP. FINDINGS  Left Ventricle: Left ventricular ejection fraction, by estimation, is 50 to 55%. The left ventricle has low normal function. The left ventricle demonstrates regional wall motion abnormalities. The left ventricular internal cavity size was mildly dilated. There is moderate left ventricular hypertrophy. Left ventricular diastolic parameters are consistent with Grade II diastolic dysfunction (pseudonormalization).  LV Wall Scoring: The basal inferolateral segment and basal inferior segment are hypokinetic. The entire anterior wall, antero-lateral wall, mid and distal lateral wall, entire septum, entire apex, and mid and distal inferior wall are normal. Right Ventricle: The right ventricular size is normal. No increase in right ventricular wall thickness. Right ventricular systolic function is low normal. There is mildly elevated pulmonary artery systolic pressure. The tricuspid  regurgitant velocity is 2.71 m/s, and with an assumed right atrial pressure of 8 mmHg, the estimated right ventricular systolic pressure is 35.4 mmHg. Left Atrium: Left atrial size was moderately dilated. Right Atrium: Right atrial size was normal in size. Pericardium: There is no evidence of pericardial effusion. Mitral Valve: The mitral valve is degenerative in appearance. There is mild thickening of the mitral valve leaflet(s). Mild mitral annular calcification. Mild mitral valve regurgitation. MV peak gradient, 7.4 mmHg. The mean mitral valve gradient is 2.0 mmHg. Tricuspid Valve: The tricuspid valve is grossly normal. Tricuspid valve regurgitation is trivial. Aortic Valve: The aortic valve is tricuspid. There is moderate aortic valve annular calcification. Aortic valve regurgitation is not visualized. Aortic valve sclerosis/calcification is present, without any evidence of aortic stenosis. Aortic valve mean gradient measures 7.5 mmHg. Aortic valve peak gradient measures 13.4 mmHg. Aortic valve area, by VTI measures 2.01 cm. Pulmonic Valve: The pulmonic valve was grossly normal. Pulmonic valve regurgitation is mild. Aorta: The aortic root is normal in size and structure. Venous: The inferior vena cava is normal in size with less than 50% respiratory variability, suggesting right atrial pressure of 8 mmHg. IAS/Shunts: No atrial level shunt detected by color flow Doppler.  LEFT VENTRICLE PLAX 2D LVIDd:  5.90 cm     Diastology LVIDs:         5.00 cm     LV e' medial:    5.22 cm/s LV PW:         1.30 cm     LV E/e' medial:  22.2 LV IVS:        1.50 cm     LV e' lateral:   6.85 cm/s LVOT diam:     2.00 cm     LV E/e' lateral: 16.9 LV SV:         95 LV SV Index:   44 LVOT Area:     3.14 cm  LV Volumes (MOD) LV vol d, MOD A2C: 93.1 ml LV vol d, MOD A4C: 68.8 ml LV vol s, MOD A2C: 46.4 ml LV vol s, MOD A4C: 29.7 ml LV SV MOD A2C:     46.7 ml LV SV MOD A4C:     68.8 ml LV SV MOD BP:      40.1 ml RIGHT VENTRICLE  RV Basal diam:  3.60 cm RV Mid diam:    3.30 cm RV S prime:     11.00 cm/s TAPSE (M-mode): 2.7 cm LEFT ATRIUM           Index        RIGHT ATRIUM           Index LA diam:      4.50 cm 2.08 cm/m   RA Area:     22.20 cm LA Vol (A2C): 47.1 ml 21.75 ml/m  RA Volume:   62.70 ml  28.96 ml/m LA Vol (A4C): 91.6 ml 42.31 ml/m  AORTIC VALVE                     PULMONIC VALVE AV Area (Vmax):    2.06 cm      PV Vmax:       0.93 m/s AV Area (Vmean):   2.06 cm      PV Peak grad:  3.5 mmHg AV Area (VTI):     2.01 cm AV Vmax:           183.00 cm/s AV Vmean:          132.000 cm/s AV VTI:            0.470 m AV Peak Grad:      13.4 mmHg AV Mean Grad:      7.5 mmHg LVOT Vmax:         120.00 cm/s LVOT Vmean:        86.500 cm/s LVOT VTI:          0.301 m LVOT/AV VTI ratio: 0.64  AORTA Ao Root diam: 3.40 cm Ao Asc diam:  2.90 cm MITRAL VALVE                TRICUSPID VALVE MV Area (PHT): 3.51 cm     TR Peak grad:   29.4 mmHg MV Area VTI:   1.98 cm     TR Vmax:        271.00 cm/s MV Peak grad:  7.4 mmHg MV Mean grad:  2.0 mmHg     SHUNTS MV Vmax:       1.36 m/s     Systemic VTI:  0.30 m MV Vmean:      51.8 cm/s    Systemic Diam: 2.00 cm MV Decel Time: 216 msec MV E velocity: 116.00 cm/s MV A velocity: 97.30 cm/s MV  E/A ratio:  1.19 Rozann Lesches MD Electronically signed by Rozann Lesches MD Signature Date/Time: 08/09/2022/12:59:18 PM    Final     SIGNED: Deatra James, MD, FHM. [name],  Pager (please use [email or web address] to page/text) Please use Epic Secure Chat for non-urgent communication (7AM-7PM)  If 7PM-7AM, please contact night-coverage [email or web address], 08/10/2022, 11:05 AM   \

## 2022-08-10 NOTE — Progress Notes (Signed)
ANTICOAGULATION CONSULT NOTE  Pharmacy Consult for Heparin  Indication: chest pain/ACS  No Known Allergies  Patient Measurements: Height: '5\' 10"'$  (177.8 cm) Weight: 95.8 kg (211 lb 3.2 oz) IBW/kg (Calculated) : 73  Vital Signs: Temp: 97.3 F (36.3 C) (08/15 0007) Temp Source: Oral (08/15 0007) BP: 125/43 (08/14 2100) Pulse Rate: 56 (08/14 1900)  Labs: Recent Labs    08/09/22 0011 08/09/22 0206 08/09/22 0716 08/09/22 0848 08/09/22 1641 08/09/22 2109  HGB 12.9*  --   --   --   --   --   HCT 39.0  --   --   --   --   --   PLT 195  --   --   --   --   --   HEPARINUNFRC  --   --   --  <0.10* <0.10* 0.14*  CREATININE 1.01  --   --   --   --   --   TROPONINIHS 25* 35* 35* 31*  --   --      Estimated Creatinine Clearance: 62.1 mL/min (by C-G formula based on SCr of 1.01 mg/dL).   Medical History: Past Medical History:  Diagnosis Date   Alzheimer disease ([place])    Anxiety    Arthritis    Atrophic gastritis    a. By EGD 02/2013.   Carotid artery disease (HCC)    a. mild-mod plaque, <50% stenosis bilat by duplex 2018.   Coronary atherosclerosis of native coronary artery    a. Multivessel s/p CABG 1996. b. s/p DES x 2 SVG to PDA 8/12 with distal disease managed medically.   DDD (degenerative disc disease)    Chronic back pain   Dementia (HCC)    Enlarged prostate    Essential hypertension    Hematuria    Hypothyroidism    LBBB (left bundle branch block)    MI (myocardial infarction) (HCC)    Mixed hyperlipidemia    OA (osteoarthritis)    OSA (obstructive sleep apnea)    Pneumonia due to COVID-19 virus    February 2021   Sinus bradycardia    a. Aricept and BB discontinued due to this.     Assessment: 88 y/o M with long standing cardiac history here with chest pain. Very mild elevation in high sensitivity troponin. Starting heparin. Labs above reviewed. PTA meds reviewed.   8/15 AM update:  Heparin level low but trending up  Goal of Therapy:  Heparin level  0.3-0.7 units/ml Monitor platelets by anticoagulation protocol: Yes   Plan:  Heparin 2000 units [employer] heparin drip to 1450 units/hr 0900 Heparin level Daily CBC/Heparin level Monitor for bleeding  Narda Bonds, PharmD, BCPS Clinical Pharmacist Phone: [phone]

## 2022-08-10 NOTE — Progress Notes (Signed)
ANTICOAGULATION CONSULT NOTE  Pharmacy Consult for Heparin  Indication: chest pain/ACS  No Known Allergies  Patient Measurements: Height: '5\' 10"'$  (177.8 cm) Weight: 96.6 kg (212 lb 15.4 oz) IBW/kg (Calculated) : 73  Vital Signs: Temp: 98.6 F (37 C) (08/15 0455) Temp Source: Oral (08/15 0455) BP: 133/48 (08/15 1000) Pulse Rate: 58 (08/15 1000)  Labs: Recent Labs    08/09/22 0011 08/09/22 0206 08/09/22 0716 08/09/22 0848 08/09/22 0848 08/09/22 1641 08/09/22 2109 08/10/22 0349 08/10/22 0948  HGB 12.9*  --   --   --   --   --   --  11.3*  --   HCT 39.0  --   --   --   --   --   --  34.3*  --   PLT 195  --   --   --   --   --   --  172  --   HEPARINUNFRC  --   --   --  <0.10*   < > <0.10* 0.14*  --  0.16*  CREATININE 1.01  --   --   --   --   --   --   --   --   TROPONINIHS 25* 35* 35* 31*  --   --   --   --   --    < > = values in this interval not displayed.     Estimated Creatinine Clearance: 62.3 mL/min (by C-G formula based on SCr of 1.01 mg/dL).   Medical History: Past Medical History:  Diagnosis Date   Alzheimer disease ([place])    Anxiety    Arthritis    Atrophic gastritis    a. By EGD 02/2013.   Carotid artery disease (HCC)    a. mild-mod plaque, <50% stenosis bilat by duplex 2018.   Coronary atherosclerosis of native coronary artery    a. Multivessel s/p CABG 1996. b. s/p DES x 2 SVG to PDA 8/12 with distal disease managed medically.   DDD (degenerative disc disease)    Chronic back pain   Dementia (HCC)    Enlarged prostate    Essential hypertension    Hematuria    Hypothyroidism    LBBB (left bundle branch block)    MI (myocardial infarction) (HCC)    Mixed hyperlipidemia    OA (osteoarthritis)    OSA (obstructive sleep apnea)    Pneumonia due to COVID-19 virus    February 2021   Sinus bradycardia    a. Aricept and BB discontinued due to this.     Assessment: 88 y/o M with long standing cardiac history here with chest pain. Very mild  elevation in high sensitivity troponin. Starting heparin. Labs above reviewed. PTA meds reviewed.   HL 0.16- subtherapeutic  Goal of Therapy:  Heparin level 0.3-0.7 units/ml Monitor platelets by anticoagulation protocol: Yes   Plan:  Heparin 2000 units [employer] heparin drip to 1650 units/hr Heparin to be stopped tonight at midnight so wont order level Monitor for bleeding  Margot Ables, PharmD Clinical Pharmacist 08/10/2022 11:40 AM

## 2022-08-11 ENCOUNTER — Encounter (HOSPITAL_COMMUNITY): Payer: Self-pay | Admitting: Internal Medicine

## 2022-08-11 DIAGNOSIS — I200 Unstable angina: Secondary | ICD-10-CM | POA: Diagnosis not present

## 2022-08-11 LAB — LIPID PANEL
Cholesterol: 92 mg/dL (ref 0–200)
HDL: 33 mg/dL — ABNORMAL LOW (ref >40)
LDL Cholesterol: 33 mg/dL (ref 0–99)
Total CHOL/HDL Ratio: 2.8 RATIO
Triglycerides: 130 mg/dL (ref <150)
VLDL: 26 mg/dL (ref 0–40)

## 2022-08-11 LAB — CBC
HCT: 34.6 % — ABNORMAL LOW (ref 39.0–52.0)
Hemoglobin: 11.1 g/dL — ABNORMAL LOW (ref 13.0–17.0)
MCH: 31.1 pg (ref 26.0–34.0)
MCHC: 32.1 g/dL (ref 30.0–36.0)
MCV: 96.9 fL (ref 80.0–100.0)
Platelets: 187 10*3/uL (ref 150–400)
RBC: 3.57 MIL/uL — ABNORMAL LOW (ref 4.22–5.81)
RDW: 13.3 % (ref 11.5–15.5)
WBC: 5.1 10*3/uL (ref 4.0–10.5)
nRBC: 0 % (ref 0.0–0.2)

## 2022-08-11 MED ORDER — RANOLAZINE ER 500 MG PO TB12: 500.0000 mg | ORAL_TABLET | Freq: Two times a day (BID) | ORAL | 11 refills | Status: DC

## 2022-08-11 MED ORDER — SPIRONOLACTONE 25 MG PO TABS: 12.5000 mg | ORAL_TABLET | Freq: Every day | ORAL | 2 refills | Status: DC

## 2022-08-11 MED ORDER — CLOPIDOGREL BISULFATE 75 MG PO TABS: 75.0000 mg | ORAL_TABLET | Freq: Every day | ORAL | 1 refills | Status: AC

## 2022-08-11 NOTE — Progress Notes (Addendum)
Progress Note  Patient Name: Dustin [name] Date of Encounter: 08/11/2022  [facility] Cardiologist: Rozann Lesches, MD   Subjective   No chest pain this morning. Breathing at baseline. Consuming breakfast. Anxious to go home.   Inpatient Medications    Scheduled Meds:  albuterol  2.5 mg Nebulization BID   Chlorhexidine Gluconate Cloth  6 each Topical Daily   clopidogrel  75 mg Oral Daily   isosorbide mononitrate  60 mg Oral BID   levothyroxine  75 mcg Oral Daily   memantine  10 mg Oral BID   polyethylene glycol  17 g Oral Daily   ranolazine  500 mg Oral BID   rosuvastatin  40 mg Oral q1800   spironolactone  12.5 mg Oral Daily    PRN Meds: acetaminophen, albuterol, ondansetron (ZOFRAN) IV, oxycodone   Vital Signs    Vitals:   08/10/22 2031 08/11/22 0116 08/11/22 0444 08/11/22 0711  BP: (!) 148/60 (!) 136/52 124/60   Pulse: (!) 55 (!) 43 (!) 47   Resp: 20  20   Temp: 98.6 F (37 C) 98.2 F (36.8 C) 98.6 F (37 C)   TempSrc:      SpO2: 98% 97% 100% 100%  Weight:      Height:        Intake/Output Summary (Last 24 hours) at 08/11/2022 0809 Last data filed at 08/10/2022 1700 Gross per 24 hour  Intake 160.31 ml  Output --  Net 160.31 ml      08/10/2022    4:55 AM 08/09/2022    5:27 PM 08/09/2022   12:07 AM  Last 3 Weights  Weight (lbs) 212 lb 15.4 oz 211 lb 3.2 oz 218 lb  Weight (kg) 96.6 kg 95.8 kg 98.884 kg      Telemetry    Sinus bradycardia, HR in 40's to 50's. Occasional PAC's and PVC's.  - Personally Reviewed  ECG    Sinus bradycardia, HR 43 with IVCD.  - Personally Reviewed  Physical Exam   GEN: Elderly male appearing in no acute distress.   Neck: No JVD Cardiac: Regular rhythm, bradycardiac rate. no murmurs, rubs, or gallops.  Respiratory: Clear to auscultation bilaterally. GI: Soft, nontender, non-distended  MS: No pitting edema; No deformity. Neuro:  Nonfocal  Psych: A&Ox2 this AM.   Labs    High Sensitivity Troponin:    Recent Labs  Lab 08/09/22 0011 08/09/22 0206 08/09/22 0716 08/09/22 0848  TROPONINIHS 25* 35* 35* 31*     Chemistry Recent Labs  Lab 08/09/22 0011  NA 136  K 3.7  CL 101  CO2 25  GLUCOSE 130*  BUN 16  CREATININE 1.01  CALCIUM 9.1  GFRNONAA >60  ANIONGAP 10     Hematology Recent Labs  Lab 08/09/22 0011 08/10/22 0349 08/11/22 0412  WBC 9.9 7.8 5.1  RBC 4.12* 3.64* 3.57*  HGB 12.9* 11.3* 11.1*  HCT 39.0 34.3* 34.6*  MCV 94.7 94.2 96.9  MCH 31.3 31.0 31.1  MCHC 33.1 32.9 32.1  RDW 13.6 13.6 13.3  PLT 195 172 187    BNP Recent Labs  Lab 08/09/22 0011  BNP 550.0*    Cardiac Studies   Echocardiogram: 08/09/2022 IMPRESSIONS    1. Left ventricular ejection fraction, by estimation, is 50 to 55%. The  left ventricle has low normal function. The left ventricle demonstrates  regional wall motion abnormalities (see scoring diagram/findings for  description). The left ventricular  internal cavity size was mildly dilated. There is moderate left  ventricular hypertrophy. Left ventricular diastolic parameters are  consistent with Grade II diastolic dysfunction (pseudonormalization).   2. Right ventricular systolic function is low normal. The right  ventricular size is normal. There is mildly elevated pulmonary artery  systolic pressure. The estimated right ventricular systolic pressure is  38.2 mmHg.   3. Left atrial size was moderately dilated.   4. The mitral valve is degenerative. Mild mitral valve regurgitation. The  mean mitral valve gradient is 2.0 mmHg.   5. The aortic valve is tricuspid. Aortic valve regurgitation is not  visualized. Aortic valve sclerosis/calcification is present, without any  evidence of aortic stenosis. Aortic valve mean gradient measures 7.5 mmHg.   6. The inferior vena cava is normal in size with <50% respiratory  variability, suggesting right atrial pressure of 8 mmHg.   Comparison(s): Prior images reviewed side by side. LVEF low  normal range  at 50-55% with basal inferoposterior hypokinesis. Mildly increased RVSP.   Patient Profile     88 y.o. male w/ PMH of CAD (s/p CABG in 1996, DESx2 to SVG-PDA in 2012, DES to distal LM in 11/2019, cath in 02/2021 showing patent LM stent, patent LIMA-LAD, patent SVG-OM1 with chronically occluded jump limb to OM2 and chronic occlusion of SVG-D2 and SVG-PDA and no targets for intervention), HFmrEF (EF 45-50% in 02/2021), LBBB, HTN, HLD and dementia who is currently admitted for chest pain.   Assessment & Plan    1. Chest Pain concerning for Angina - Presented with chest pain concerning for angina and medical management has been recommended given his age, dementia and recent catheterization results from last year.  - Hs Troponin values have been flat at 25, 35, 35 and 31 this admission. Repeat echo shows his EF is only mildly reduced at 50-55% with the basal inferolateral segment and basal inferior segment being hypokinetic.  - He has now completed 48 hours of Heparin. Continue current medical therapy with Plavix '75mg'$  daily, Imdur '60mg'$  BID, Ranexa '500mg'$  BID and Crestor '40mg'$  daily. While his EF is mildly reduced, he has not been started on an SGLT2 inhibitor given frequent UTI's. He is on Spironolactone 12.'5mg'$  daily.  2. CAD - He is s/p CABG in 1996 with interventions as outlined above. Recent catheterization last year showed patent LM stent, patent LIMA-LAD, patent SVG-OM1 with chronically occluded jump limb to OM2 and chronic occlusion of SVG-D2 and SVG-PDA and no targets for intervention.  - Continue Plavix, Imdur, Ranexa and statin therapy. No beta-blocker given his bradycardia.   3. HLD - No recent FLP on file. Will add to AM labs. Remains on Crestor '40mg'$  daily.   4. History of Bradycardia - He has not been on beta-blocker therapy given his baseline bradycardia.   No plans for further cardiac testing at this time. Will arrange for outpatient follow-up.  For questions or  updates, please contact [place] Please consult [email or web address] for contact info under        Signed, Erma Heritage, PA-C  08/11/2022, 8:09 AM      Attending note:  Patient seen and examined.  He does not report any recurrent chest pain and is tolerating present medications well.  IV heparin has been discontinued and plan will be discharge home today on Plavix, Imdur 60 mg twice daily, Ranexa 500 mg twice daily, Crestor 40 mg daily, and Aldactone 12.5 mg daily.  We will arrange follow-up.  Satira Sark, M.D., F.A.C.C.

## 2022-08-11 NOTE — Progress Notes (Signed)
Patient's heart rate dropped down in the 40s. This appears to have happened last night in the ICU. Notified MD. Will continue to monitor.

## 2022-08-11 NOTE — Discharge Summary (Signed)
Physician Discharge Summary   Patient: Dustin [name] MRN: [id number] DOB: [birth year]  Admit date:     08/08/2022  Discharge date: 08/11/22  Discharge Physician: Deatra James   PCP: [name], MD   Recommendations at discharge:   Follow-up with cardiologist within 1 week Follow-up with PCP in 2-4 weeks  Discharge Diagnoses: Principal Problem:   Unstable angina ([place]) Active Problems:   Hyperlipidemia   Alzheimer disease ([place])   Hypothyroidism   Benign prostatic hyperplasia with urinary obstruction   COPD (chronic obstructive pulmonary disease) ([place])  Resolved Problems:   * No resolved hospital problems. *  Hospital Course: Dustin [name] is a 88 y.o. male with medical history significant of coronary artery disease status post multiple stents and CABG in 1996 with the last About a year ago recommendations for medical management, essential hypertension, hypothyroidism, Alzheimer's dementia, hyperlipidemia, BPH, depression with anxiety and degenerative disc disease who presented to the ER with escalating progressive chest pain and shortness of breath.  Chest pain was substernal and consistent with his previous MIs.  No significant radiation.  He took his nitroglycerin at home with no significant relief.   Patient has been on Imdur and Ranexa, not clear if he has been taking these medicines lately.  Initial troponin was 30 and then 25.  No significant EKG changes.   Case discussed with cardiology at [facility] who recommends admission here with cardiology evaluation.  We are therefore admitting the patient with unstable angina initiating heparin and nitroglycerin drip.   Assessment and Plan:   Principal Problem:   Unstable angina (H[facility]) Active Problems:   Hyperlipidemia   Alzheimer disease ([place])   Hypothyroidism   Benign prostatic hyperplasia with urinary obstruction   COPD (chronic obstructive pulmonary disease) ([facility])  Unstable angina:  -Angina has improved, Troponin  remained flat 20-30s--no indication of acute ACS -He has been on heparin drip and nitroglycerin drip---was discontinued -Known CAD, on medical management  -Currently cardiology recommending medical management-  CAD status post CABG 1996, DES x2 SVG to the PDA 2012 distal disease managed medically, DES to the distal left main 11/2019, 2 out of 5 grafts patent,   Cath -03/02/2021- LVEDP and mildly reduced LVEF 45 to 50% Lasix increased to 40 mg daily -ischemic cardiomyopathy  Repeat echo: - -Continue Plavix, Imdur -Aldactone was added by cardiology -Continue Ranexa, and Crestor   Patient apparently not a candidate for SGLT2 inhibitor due to history of UTIs   Uncontrolled hypertension:  Restart oral medication, discontinue nitroglycerin drip BP was elevated on admission, improving   BPH: Resume home regimen of Flomax.   Hypothyroidism: Continue levothyroxine   Dementia: Alzheimer's.  Stable.  Continue home regimen of Namenda   COPD: No acute exacerbation  Assessment and Plan: No notes have been filed under this hospital service. Service: Hospitalist       Consultants: Cardiology Procedures performed: none   Disposition: Home Diet recommendation:  Discharge Diet Orders (From admission, onward)     Start     Ordered   08/11/22 0000  Diet - low sodium heart healthy        08/11/22 1033           Cardiac diet DISCHARGE MEDICATION: Allergies as of 08/11/2022   No Known Allergies      Medication List     STOP taking these medications    busPIRone 10 MG tablet Commonly known as: BUSPAR   doxazosin 1 MG tablet Commonly known as: CARDURA   hydrOXYzine  25 MG tablet Commonly known as: ATARAX   Movantik 25 MG Tabs tablet Generic drug: naloxegol oxalate   mupirocin ointment 2 % Commonly known as: BACTROBAN   pantoprazole 40 MG tablet Commonly known as: PROTONIX   sulfamethoxazole-trimethoprim 800-160 MG tablet Commonly known as: BACTRIM DS    tamsulosin 0.4 MG Caps capsule Commonly known as: FLOMAX   triamcinolone cream 0.1 % Commonly known as: KENALOG       TAKE these medications    albuterol 108 (90 Base) MCG/ACT inhaler Commonly known as: VENTOLIN HFA Inhale 2 puffs into the lungs every 6 (six) hours as needed for wheezing or shortness of breath.   albuterol (2.5 MG/3ML) 0.083% nebulizer solution Commonly known as: PROVENTIL INHALE 1 VIAL VIA NEBULIZER EVERY 6 HOURS AS NEEDED FOR WHEEZING OR SHORT OF BREATH.   alfuzosin 10 MG 24 hr tablet Commonly known as: UROXATRAL Take 1 tablet (10 mg total) by mouth daily with breakfast.   Breztri Aerosphere 160-9-4.8 MCG/ACT Aero Generic drug: Budeson-Glycopyrrol-Formoterol Inhale 2 puffs into the lungs in the morning and at bedtime.   clopidogrel 75 MG tablet Commonly known as: PLAVIX Take 1 tablet (75 mg total) by mouth daily.   dutasteride 0.5 MG capsule Commonly known as: AVODART Take 1 capsule (0.5 mg total) by mouth daily. In the afternoon   escitalopram 20 MG tablet Commonly known as: LEXAPRO Take 20 mg by mouth every morning.   furosemide 20 MG tablet Commonly known as: LASIX Take 20 mg by mouth 2 (two) times daily.   isosorbide mononitrate 60 MG 24 hr tablet Commonly known as: IMDUR Take 120 mg by mouth daily.   levothyroxine 75 MCG tablet Commonly known as: SYNTHROID Take 75 mcg by mouth daily.   memantine 10 MG tablet Commonly known as: NAMENDA Take 10 mg by mouth 2 (two) times daily.   nitroGLYCERIN 0.4 MG SL tablet Commonly known as: NITROSTAT Place 1 tablet (0.4 mg total) under the tongue every 5 (five) minutes as needed for chest pain. Pt has bought nitro bottle from home and oxycodone 30 mg ( 3 or 4 tabs) is in nitro bottle per wife Dustin [name]. What changed: additional instructions   oxycodone 30 MG immediate release tablet Commonly known as: ROXICODONE Take 30 mg by mouth every 4 (four) hours as needed.   potassium chloride SA  20 MEQ tablet Commonly known as: KLOR-CON M Take 20 mEq by mouth daily.   QUEtiapine 25 MG tablet Commonly known as: SEROQUEL Take 25 mg by mouth at bedtime as needed (sleep).   ranolazine 500 MG 12 hr tablet Commonly known as: Ranexa Take 1 tablet (500 mg total) by mouth 2 (two) times daily.   rosuvastatin 40 MG tablet Commonly known as: CRESTOR Take 1 tablet (40 mg total) by mouth daily at 6 PM.   spironolactone 25 MG tablet Commonly known as: ALDACTONE Take 0.5 tablets (12.5 mg total) by mouth daily.        Follow-up Information     Charlie Pitter, PA-C Follow up on 09/02/2022.   Specialties: Cardiology, Radiology Why: Cardiology Hospital Follow-up on 09/02/2022 at 2:30 PM. Contact information: [address] [place] [address] [phone]                Discharge Exam: Filed Weights   08/09/22 0007 08/09/22 1727 08/10/22 0455  Weight: 98.9 kg 95.8 kg 96.6 kg      Physical Exam:   General:  AAO x 3,  cooperative, no distress;  HEENT:  Normocephalic, PERRL, otherwise with in Normal limits   Neuro:  CNII-XII intact. , normal motor and sensation, reflexes intact   Lungs:   Clear to auscultation BL, Respirations unlabored,  No wheezes / crackles  Cardio:    S1/S2, RRR, No murmure, No Rubs or Gallops   Abdomen:  Soft, non-tender, bowel sounds active all four quadrants, no guarding or peritoneal signs.  Muscular  skeletal:  Limited exam -global generalized weaknesses - in bed, able to move all 4 extremities,   2+ pulses,  symmetric, No pitting edema  Skin:  Dry, warm to touch, negative for any Rashes,  Wounds: Please see nursing documentation  Pressure Injury 02/15/17 Deep Tissue Injury - Purple or maroon localized area of discolored intact skin or blood-filled blister due to damage of underlying soft tissue from pressure and/or shear. maroon color localized area on right buttocks (Active)  02/15/17 1000  Location: Buttocks  Location Orientation: Right   Staging: Deep Tissue Injury - Purple or maroon localized area of discolored intact skin or blood-filled blister due to damage of underlying soft tissue from pressure and/or shear.  Wound Description (Comments): maroon color localized area on right buttocks  Present on Admission: No          Condition at discharge: good  The results of significant diagnostics from this hospitalization (including imaging, microbiology, ancillary and laboratory) are listed below for reference.   Imaging Studies: ECHOCARDIOGRAM COMPLETE  Result Date: 08/09/2022    ECHOCARDIOGRAM REPORT   Patient Name:   Dustin [name] Date of Exam: 08/09/2022 Medical Rec #:  [id number]       Height:       70.0 in Accession #:    [id number]      Weight:       218.0 lb Date of Birth:  02-15-1934       BSA:          2.165 m Patient Age:    33 years        BP:           144/59 mmHg Patient Gender: M               HR:           67 bpm. Exam Location:  [facility] Procedure: 2D Echo, Cardiac Doppler and Color Doppler Indications:    Chest Pain  History:        Patient has prior history of Echocardiogram examinations, most                 recent 12/10/2019. CHF, CAD and Previous Myocardial Infarction,                 COPD, Arrythmias:Bradycardia, Signs/Symptoms:Chest Pain and                 Alzheimer's; Risk Factors:Hypertension and Dyslipidemia.  Sonographer:    Wenda Low Referring Phys: [place]  1. Left ventricular ejection fraction, by estimation, is 50 to 55%. The left ventricle has low normal function. The left ventricle demonstrates regional wall motion abnormalities (see scoring diagram/findings for description). The left ventricular internal cavity size was mildly dilated. There is moderate left ventricular hypertrophy. Left ventricular diastolic parameters are consistent with Grade II diastolic dysfunction (pseudonormalization).  2. Right ventricular systolic function is low normal. The right  ventricular size is normal. There is mildly elevated pulmonary artery systolic pressure. The estimated right ventricular systolic pressure is 40.3 mmHg.  3. Left  atrial size was moderately dilated.  4. The mitral valve is degenerative. Mild mitral valve regurgitation. The mean mitral valve gradient is 2.0 mmHg.  5. The aortic valve is tricuspid. Aortic valve regurgitation is not visualized. Aortic valve sclerosis/calcification is present, without any evidence of aortic stenosis. Aortic valve mean gradient measures 7.5 mmHg.  6. The inferior vena cava is normal in size with <50% respiratory variability, suggesting right atrial pressure of 8 mmHg. Comparison(s): Prior images reviewed side by side. LVEF low normal range at 50-55% with basal inferoposterior hypokinesis. Mildly increased RVSP. FINDINGS  Left Ventricle: Left ventricular ejection fraction, by estimation, is 50 to 55%. The left ventricle has low normal function. The left ventricle demonstrates regional wall motion abnormalities. The left ventricular internal cavity size was mildly dilated. There is moderate left ventricular hypertrophy. Left ventricular diastolic parameters are consistent with Grade II diastolic dysfunction (pseudonormalization).  LV Wall Scoring: The basal inferolateral segment and basal inferior segment are hypokinetic. The entire anterior wall, antero-lateral wall, mid and distal lateral wall, entire septum, entire apex, and mid and distal inferior wall are normal. Right Ventricle: The right ventricular size is normal. No increase in right ventricular wall thickness. Right ventricular systolic function is low normal. There is mildly elevated pulmonary artery systolic pressure. The tricuspid regurgitant velocity is 2.71 m/s, and with an assumed right atrial pressure of 8 mmHg, the estimated right ventricular systolic pressure is 81.1 mmHg. Left Atrium: Left atrial size was moderately dilated. Right Atrium: Right atrial size was normal in  size. Pericardium: There is no evidence of pericardial effusion. Mitral Valve: The mitral valve is degenerative in appearance. There is mild thickening of the mitral valve leaflet(s). Mild mitral annular calcification. Mild mitral valve regurgitation. MV peak gradient, 7.4 mmHg. The mean mitral valve gradient is 2.0 mmHg. Tricuspid Valve: The tricuspid valve is grossly normal. Tricuspid valve regurgitation is trivial. Aortic Valve: The aortic valve is tricuspid. There is moderate aortic valve annular calcification. Aortic valve regurgitation is not visualized. Aortic valve sclerosis/calcification is present, without any evidence of aortic stenosis. Aortic valve mean gradient measures 7.5 mmHg. Aortic valve peak gradient measures 13.4 mmHg. Aortic valve area, by VTI measures 2.01 cm. Pulmonic Valve: The pulmonic valve was grossly normal. Pulmonic valve regurgitation is mild. Aorta: The aortic root is normal in size and structure. Venous: The inferior vena cava is normal in size with less than 50% respiratory variability, suggesting right atrial pressure of 8 mmHg. IAS/Shunts: No atrial level shunt detected by color flow Doppler.  LEFT VENTRICLE PLAX 2D LVIDd:         5.90 cm     Diastology LVIDs:         5.00 cm     LV e' medial:    5.22 cm/s LV PW:         1.30 cm     LV E/e' medial:  22.2 LV IVS:        1.50 cm     LV e' lateral:   6.85 cm/s LVOT diam:     2.00 cm     LV E/e' lateral: 16.9 LV SV:         95 LV SV Index:   44 LVOT Area:     3.14 cm  LV Volumes (MOD) LV vol d, MOD A2C: 93.1 ml LV vol d, MOD A4C: 68.8 ml LV vol s, MOD A2C: 46.4 ml LV vol s, MOD A4C: 29.7 ml LV SV MOD A2C:     46.7  ml LV SV MOD A4C:     68.8 ml LV SV MOD BP:      40.1 ml RIGHT VENTRICLE RV Basal diam:  3.60 cm RV Mid diam:    3.30 cm RV S prime:     11.00 cm/s TAPSE (M-mode): 2.7 cm LEFT ATRIUM           Index        RIGHT ATRIUM           Index LA diam:      4.50 cm 2.08 cm/m   RA Area:     22.20 cm LA Vol (A2C): 47.1 ml 21.75  ml/m  RA Volume:   62.70 ml  28.96 ml/m LA Vol (A4C): 91.6 ml 42.31 ml/m  AORTIC VALVE                     PULMONIC VALVE AV Area (Vmax):    2.06 cm      PV Vmax:       0.93 m/s AV Area (Vmean):   2.06 cm      PV Peak grad:  3.5 mmHg AV Area (VTI):     2.01 cm AV Vmax:           183.00 cm/s AV Vmean:          132.000 cm/s AV VTI:            0.470 m AV Peak Grad:      13.4 mmHg AV Mean Grad:      7.5 mmHg LVOT Vmax:         120.00 cm/s LVOT Vmean:        86.500 cm/s LVOT VTI:          0.301 m LVOT/AV VTI ratio: 0.64  AORTA Ao Root diam: 3.40 cm Ao Asc diam:  2.90 cm MITRAL VALVE                TRICUSPID VALVE MV Area (PHT): 3.51 cm     TR Peak grad:   29.4 mmHg MV Area VTI:   1.98 cm     TR Vmax:        271.00 cm/s MV Peak grad:  7.4 mmHg MV Mean grad:  2.0 mmHg     SHUNTS MV Vmax:       1.36 m/s     Systemic VTI:  0.30 m MV Vmean:      51.8 cm/s    Systemic Diam: 2.00 cm MV Decel Time: 216 msec MV E velocity: 116.00 cm/s MV A velocity: 97.30 cm/s MV E/A ratio:  1.19 Rozann Lesches MD Electronically signed by Rozann Lesches MD Signature Date/Time: 08/09/2022/12:59:18 PM    Final    DG Chest Port 1 View  Result Date: 08/09/2022 CLINICAL DATA:  Chest pain. EXAM: PORTABLE CHEST 1 VIEW COMPARISON:  July 31, 2021 FINDINGS: Multiple sternal wires and vascular clips are seen. The cardiac silhouette is mildly enlarged and unchanged in size. Mild, diffusely increased interstitial lung markings are seen. Mild prominence of the perihilar pulmonary vasculature is also noted. Mild, stable bibasilar atelectasis is present. There is no evidence of focal consolidation, pleural effusion or pneumothorax. Multilevel degenerative changes are seen throughout the thoracic spine. IMPRESSION: 1. Evidence of prior median sternotomy/CABG. 2. Mild pulmonary vascular congestion. 3. Mild, stable bibasilar atelectasis. Electronically Signed   By: Virgina Norfolk M.D.   On: 08/09/2022 00:35    Microbiology: Results for orders  placed or performed during the  hospital encounter of 08/08/22  MRSA Next Gen by PCR, Nasal     Status: None   Collection Time: 08/09/22  5:30 PM   Specimen: Nasal Mucosa; Nasal Swab  Result Value Ref Range Status   MRSA by PCR Next Gen NOT DETECTED NOT DETECTED Final    Comment: (NOTE) The GeneXpert MRSA Assay (FDA approved for NASAL specimens only), is one component of a comprehensive MRSA colonization surveillance program. It is not intended to diagnose MRSA infection nor to guide or monitor treatment for MRSA infections. Test performance is not FDA approved in patients less than 57 years old. Performed at [facility], [address], [place], [place] [address]     Labs: CBC: Recent Labs  Lab 08/09/22 0011 08/10/22 0349 08/11/22 0412  WBC 9.9 7.8 5.1  NEUTROABS 7.6  --   --   HGB 12.9* 11.3* 11.1*  HCT 39.0 34.3* 34.6*  MCV 94.7 94.2 96.9  PLT 195 172 546   Basic Metabolic Panel: Recent Labs  Lab 08/09/22 0011  NA 136  K 3.7  CL 101  CO2 25  GLUCOSE 130*  BUN 16  CREATININE 1.01  CALCIUM 9.1   Liver Function Tests: No results for input(s): "AST", "ALT", "ALKPHOS", "BILITOT", "PROT", "ALBUMIN" in the last 168 hours. CBG: No results for input(s): "GLUCAP" in the last 168 hours.  Discharge time spent: greater than 30 minutes.  Signed: Deatra James, MD [name] 08/11/2022

## 2022-08-11 NOTE — Care Management Important Message (Signed)
Important Message  Patient Details  Name: Dustin [name] MRN: [id number] Date of Birth: [birth year]   Medicare Important Message Given:  N/A - LOS <3 / Initial given by admissions     Tommy Medal 08/11/2022, 11:22 AM

## 2022-08-11 NOTE — Progress Notes (Signed)
EKG done and given to nurse 

## 2022-08-16 DIAGNOSIS — I209 Angina pectoris, unspecified: Secondary | ICD-10-CM | POA: Diagnosis not present

## 2022-08-16 DIAGNOSIS — I5032 Chronic diastolic (congestive) heart failure: Secondary | ICD-10-CM | POA: Diagnosis not present

## 2022-08-16 DIAGNOSIS — I700 Atherosclerosis of aorta: Secondary | ICD-10-CM | POA: Diagnosis not present

## 2022-08-16 DIAGNOSIS — F0280 Dementia in other diseases classified elsewhere without behavioral disturbance: Secondary | ICD-10-CM | POA: Diagnosis not present

## 2022-08-16 DIAGNOSIS — Z6829 Body mass index (BMI) 29.0-29.9, adult: Secondary | ICD-10-CM | POA: Diagnosis not present

## 2022-08-16 DIAGNOSIS — E663 Overweight: Secondary | ICD-10-CM | POA: Diagnosis not present

## 2022-08-16 DIAGNOSIS — J449 Chronic obstructive pulmonary disease, unspecified: Secondary | ICD-10-CM | POA: Diagnosis not present

## 2022-08-16 DIAGNOSIS — M48061 Spinal stenosis, lumbar region without neurogenic claudication: Secondary | ICD-10-CM | POA: Diagnosis not present

## 2022-08-31 NOTE — Progress Notes (Signed)
Cardiology Office Note    Date:  09/02/2022   ID:  HERSEY MACLELLAN, DOB [birth year], MRN [id number]  PCP:  [name], MD  Cardiologist:  Rozann Lesches, MD  Electrophysiologist:  None   Chief Complaint: post-hospital f/u  History of Present Illness:   Dustin [name] is a 88 y.o. male with history of CAD (s/p CABG in 1996, DESx2 to SVG-PDA in 2012, DES to distal LM in 11/2019, cath in 02/2021 showing patent LM stent, patent LIMA-LAD, patent SVG-OM1 with chronically occluded jump limb to OM2 and chronic occlusion of SVG-D2 and SVG-PDA and no targets for intervention), HFmrEF (EF 45-50% in 02/2021), LBBB, HTN, HLD, baseline bradycardia, carotid artery disease, dementia, thyroid disease, atrophic gastritis by EGD 2014 who is seen for post-hospital f/u. He was recently admitted 07/2022 for chest pain/[place] and low/flat troponin peaking at 35. Repeat echo showed his EF is only mildly reduced at 50-55% with the basal inferolateral segment and basal inferior segment being hypokinetic. Given age, dementia, and cath findings from last year, medical therapy was recommended. Regarding EF, he has not been started on an SGLT2 inhibitor given frequent UTI's. He has not been on BB due to h/o bradycardia (monitor 2018 showed lowest HR 44bpm). He was started on low dose spironolactone.  He is seen for post-hospital follow-up today with his wife Dustin [name]. Overall he is doing OK but complaints of worsening of chronic dizziness that they state has been present even back to last year. CT head 09/2021 showed probable old stroke and atrophy but no acute changes. This seems to be worse after starting the spironolactone. He notices it when ambulating around, improved after sitting down, though wife also reports he's also had it when lying down as well. He is not dizzy today, has been a decent day. He continued to have some chest pain after discharge though states he cut down on red meat and symptoms improved. His dyspnea  is unchanged. He does not have any edema. He has not had any true syncope. I am concerned about his overall debilitated state. Wife reports history of falling and occasionally choking on his foods. I encouraged close f/u with PCP.  Labwork independently reviewed: 07/2022 LDL 33, trig 130, Hgb 11.1, plt 187, trop peak 35, K 3.7, Cr 1.01 07/2021 AST/ALT Ok, albumin 2.9, TSH wnl  Cardiology Studies:   Studies reviewed are outlined and summarized above. Reports included below if pertinent.   2D echo 08/09/22  1. Left ventricular ejection fraction, by estimation, is 50 to 55%. The  left ventricle has low normal function. The left ventricle demonstrates  regional wall motion abnormalities (see scoring diagram/findings for  description). The left ventricular  internal cavity size was mildly dilated. There is moderate left  ventricular hypertrophy. Left ventricular diastolic parameters are  consistent with Grade II diastolic dysfunction (pseudonormalization).   2. Right ventricular systolic function is low normal. The right  ventricular size is normal. There is mildly elevated pulmonary artery  systolic pressure. The estimated right ventricular systolic pressure is  66.5 mmHg.   3. Left atrial size was moderately dilated.   4. The mitral valve is degenerative. Mild mitral valve regurgitation. The  mean mitral valve gradient is 2.0 mmHg.   5. The aortic valve is tricuspid. Aortic valve regurgitation is not  visualized. Aortic valve sclerosis/calcification is present, without any  evidence of aortic stenosis. Aortic valve mean gradient measures 7.5 mmHg.   6. The inferior vena cava is normal in size with <50%  respiratory  variability, suggesting right atrial pressure of 8 mmHg.   Comparison(s): Prior images reviewed side by side. LVEF low normal range  at 50-55% with basal inferoposterior hypokinesis. Mildly increased RVSP.   Cath 02/2021 Conclusions: Severe native coronary artery disease.  Including 99% mid LAD stenosis with competitive flow from LIMA-LAD, as well as chronic total occlusions of proximal LCx and mid RCA. Widely patent LMCA stent. Widely patent LIMA-LAD. Patent SVG-OM1 with minimal liminal irregularities.  Jump limb to OM2 is chronically occluded. Chronically occlused SVG-D2 and SVG-rPDA. Mildly reduced left ventricular contraction with global hypokinesis (LVEF 45-50%). Moderately elevated left ventricular filling pressure (LVEDP 25-30 mmHg).   Recommendations: Escalate furosemide to 40 mg daily. Consider further escalation of antianginal therapy.  No interventional targets identified on today's catheterization. Consider echo to reassess LVEF and exclude other structural abnormalities that could be contributing to symptoms. Continue aggressive secondary prevention of coronary artery disease.   Nelva Bush, MD [facility]  Carotid 02/2017 IMPRESSION: Mild-to-moderate bilateral carotid atherosclerosis. No hemodynamically significant ICA stenosis. Degree of narrowing less than 50% bilaterally.   Patent antegrade vertebral flow bilaterally     Electronically Signed   By: Jerilynn Mages.  Shick M.D.   On: 03/08/2017 13:59   LE ABIs 2012 IMPRESSION:  No evidence of significant peripheral arterial disease in either lower  extremity.   Provider: Jeneen Montgomery      Past Medical History:  Diagnosis Date   Alzheimer disease [facility])    Anxiety    Arthritis    Atrophic gastritis    a. By EGD 02/2013.   Carotid artery disease (HCC)    a. mild-mod plaque, <50% stenosis bilat by duplex 2018.   Coronary atherosclerosis of native coronary artery    a. Multivessel s/p CABG 1996. b. DESx2 to SVG-PDA in 2012 c. DES to distal LM in 11/2019 d. cath in 02/2021 showing patent LM stent, patent LIMA-LAD, patent SVG-OM1 with chronically occluded jump limb to OM2 and chronic occlusion of SVG-D2 and SVG-PDA and no targets for intervention   DDD (degenerative disc disease)     Chronic back pain   Dementia (HCC)    Enlarged prostate    Essential hypertension    Hematuria    Hypothyroidism    LBBB (left bundle branch block)    MI (myocardial infarction) ([place])    Mixed hyperlipidemia    OA (osteoarthritis)    OSA (obstructive sleep apnea)    Pneumonia due to COVID-19 virus    February 2021   Sinus bradycardia    a. Aricept and BB discontinued due to this.    Past Surgical History:  Procedure Laterality Date   CATARACT EXTRACTION W/PHACO Left 03/22/2022   Procedure: CATARACT EXTRACTION PHACO AND INTRAOCULAR LENS PLACEMENT (IOC);  Surgeon: Baruch Goldmann, MD;  Location: [facility];  Service: Ophthalmology;  Laterality: Left;  CDE: 25.22   COLONOSCOPY  08/03/2004   Jenkins-numerous large diverticula in the descending, transverse, descending, and sigmoid colon. Otherwise normal exam.   COLONOSCOPY  07/12/2012   RMR: External hemorrhoidal tag; multiple rectal and colonic polyps removed and/or treated as described above. Pancolonic diverticulosis. Bx-tubular adenomas, rectal hyperplastic polyp. next colonoscopy in 06/2015.   COLONOSCOPY N/A 06/22/2016   Procedure: COLONOSCOPY;  Surgeon: Aviva Signs, MD;  Location: [facility];  Service: Gastroenterology;  Laterality: N/A;  60   CORONARY ANGIOPLASTY WITH STENT PLACEMENT     CORONARY ARTERY BYPASS GRAFT  1996   LIMA to LAD, SVG to D2, SVG to PDA, SVG to  OM1 and OM2   CORONARY STENT INTERVENTION N/A 12/10/2019   Procedure: CORONARY STENT INTERVENTION;  Surgeon: Burnell Blanks, MD;  Location: [place] [facility];  Service: Cardiovascular;  Laterality: N/A;   ESOPHAGOGASTRODUODENOSCOPY N/A 03/16/2013   Procedure: ESOPHAGOGASTRODUODENOSCOPY (EGD);  Surgeon: Daneil Dolin, MD;  Location: [facility];  Service: Endoscopy;  Laterality: N/A;  12:00-moved to [place] notified pt   ESOPHAGOGASTRODUODENOSCOPY N/A 03/06/2013   Procedure: ESOPHAGOGASTRODUODENOSCOPY (EGD);  Surgeon: Daneil Dolin, MD;  Location: [facility];  Service: Endoscopy;  Laterality: N/A;   HERNIA REPAIR     INTRAVASCULAR ULTRASOUND/IVUS N/A 12/10/2019   Procedure: Intravascular Ultrasound/IVUS;  Surgeon: Burnell Blanks, MD;  Location: [place] [facility];  Service: Cardiovascular;  Laterality: N/A;   LEFT HEART CATH AND CORS/GRAFTS ANGIOGRAPHY N/A 12/10/2019   Procedure: LEFT HEART CATH AND CORS/GRAFTS ANGIOGRAPHY;  Surgeon: Burnell Blanks, MD;  Location: [place] [facility];  Service: Cardiovascular;  Laterality: N/A;   LEFT HEART CATH AND CORS/GRAFTS ANGIOGRAPHY N/A 03/02/2021   Procedure: LEFT HEART CATH AND CORS/GRAFTS ANGIOGRAPHY;  Surgeon: Nelva Bush, MD;  Location: [place] [facility];  Service: Cardiovascular;  Laterality: N/A;    Current Medications: Current Meds  Medication Sig   albuterol (PROVENTIL) (2.5 MG/3ML) 0.083% nebulizer solution INHALE 1 VIAL VIA NEBULIZER EVERY 6 HOURS AS NEEDED FOR WHEEZING OR SHORT OF BREATH.   albuterol (VENTOLIN HFA) 108 (90 Base) MCG/ACT inhaler Inhale 2 puffs into the lungs every 6 (six) hours as needed for wheezing or shortness of breath.   alfuzosin (UROXATRAL) 10 MG 24 hr tablet Take 1 tablet (10 mg total) by mouth daily with breakfast.   Budeson-Glycopyrrol-Formoterol (BREZTRI AEROSPHERE) 160-9-4.8 MCG/ACT AERO Inhale 2 puffs into the lungs in the morning and at bedtime.   clopidogrel (PLAVIX) 75 MG tablet Take 1 tablet (75 mg total) by mouth daily.   dutasteride (AVODART) 0.5 MG capsule Take 1 capsule (0.5 mg total) by mouth daily. In the afternoon   escitalopram (LEXAPRO) 20 MG tablet Take 20 mg by mouth every morning.    furosemide (LASIX) 20 MG tablet Take 20 mg by mouth 2 (two) times daily.   isosorbide mononitrate (IMDUR) 60 MG 24 hr tablet Take 120 mg by mouth daily.   levothyroxine (SYNTHROID) 75 MCG tablet Take 75 mcg by mouth daily.   memantine (NAMENDA) 10 MG tablet Take 10 mg by mouth 2 (two) times daily.    nitroGLYCERIN (NITROSTAT) 0.4 MG SL  tablet Place 1 tablet (0.4 mg total) under the tongue every 5 (five) minutes as needed for chest pain. Pt has bought nitro bottle from home and oxycodone 30 mg ( 3 or 4 tabs) is in nitro bottle per wife Shay Bartoli. (Patient taking differently: Place 0.4 mg under the tongue every 5 (five) minutes as needed for chest pain.)   oxycodone (ROXICODONE) 30 MG immediate release tablet Take 30 mg by mouth every 4 (four) hours as needed.   potassium chloride SA (K-DUR,KLOR-CON) 20 MEQ tablet Take 20 mEq by mouth daily.    QUEtiapine (SEROQUEL) 25 MG tablet Take 25 mg by mouth at bedtime as needed (sleep).   ranolazine (RANEXA) 500 MG 12 hr tablet Take 1 tablet (500 mg total) by mouth 2 (two) times daily.   rosuvastatin (CRESTOR) 40 MG tablet Take 1 tablet (40 mg total) by mouth daily at 6 PM.   spironolactone (ALDACTONE) 25 MG tablet Take 0.5 tablets (12.5 mg total) by mouth daily.  Allergies:   Patient has no known allergies.   Social History   Socioeconomic History   Marital status: Married    Spouse name: Not on file   Number of children: 3   Years of education: Not on file   Highest education level: Not on file  Occupational History   Occupation: retired    Fish farm manager: RETIRED  Tobacco Use   Smoking status: Former    Packs/day: 2.00    Years: 40.00    Total pack years: 80.00    Types: Cigarettes    Quit date: 12/27/1994    Years since quitting: 27.7   Smokeless tobacco: Never  Vaping Use   Vaping Use: Never used  Substance and Sexual Activity   Alcohol use: No    Alcohol/week: 0.0 standard drinks of alcohol   Drug use: No   Sexual activity: Not Currently  Other Topics Concern   Not on file  Social History Narrative   Lives w/ ailing wife.   Son brings meals 3x per week   Social Determinants of Health   Financial Resource Strain: Not on file  Food Insecurity: Not on file  Transportation Needs: Not on file  Physical Activity: Not on file  Stress: Not on file  Social  Connections: Not on file     Family History:  The patient's family history includes Heart attack in his mother; Heart disease in an other family member. There is no history of Colon cancer.  ROS:   Please see the history of present illness.  All other systems are reviewed and otherwise negative.    EKG(s)/Additional Labs   EKG:  Not ordered but reviewed from recent tracing 08/11/22 sinus bradycardia 43bpm, NSIVCD, nonspecific TW changes  Recent Labs: 08/09/2022: B Natriuretic Peptide 550.0; BUN 16; Creatinine, Ser 1.01; Potassium 3.7; Sodium 136 08/11/2022: Hemoglobin 11.1; Platelets 187  Recent Lipid Panel    Component Value Date/Time   CHOL 92 08/11/2022 0847   TRIG 130 08/11/2022 0847   HDL 33 (L) 08/11/2022 0847   CHOLHDL 2.8 08/11/2022 0847   VLDL 26 08/11/2022 0847   LDLCALC 33 08/11/2022 0847    PHYSICAL EXAM:    VS:  BP (!) 126/58   Pulse 62   Ht '5\' 10"'$  (1.778 m)   Wt 207 lb (93.9 kg)   SpO2 95%   BMI 29.70 kg/m   BMI: Body mass index is 29.7 kg/m.  GEN: Well nourished, well developed male in no acute distress HEENT: normocephalic, atraumatic Neck: no JVD, carotid bruits, or masses Cardiac: RRR; no murmurs, rubs, or gallops, no edema  Respiratory:  clear to auscultation bilaterally, normal work of breathing GI: soft, nontender, nondistended, + BS MS: no deformity or atrophy Skin: warm and dry, no rash Neuro:  Alert and Oriented to self, place, some intermittent difficulty with recall. Strength and sensation are intact, follows commands, HOH Psych: euthymic mood, full affect but perseverates  Wt Readings from Last 3 Encounters:  09/02/22 207 lb (93.9 kg)  08/10/22 212 lb 15.4 oz (96.6 kg)  04/14/22 210 lb 1.6 oz (95.3 kg)     ASSESSMENT & PLAN:   1. CAD, HLD - recent hospitalization reviewed. Only recent med change appears to be addition of spironolactone which he does not seem to be tolerating well due to worsening of reported chronic dizziness. As such  will hold off on titrating additional antianginal therapy at present time. Very challenging situation given trying to balance his tolerance of medications with maximizing anti-anginal therapy.  Continue Plavix (on monotherapy per prior notes), Imdur, Ranexa, rosuvastatin. Recent LDL at goal.  2. Dizziness - acute on chronic, could be multifactorial. Some orthostatic features but other times at rest as well. CT head from last year reviewed as above. Will obtain 3 day Zio (with specific instructions to push button when dizzy for diary correlation) as well as carotid duplex. Update CBC, BMET, TSH today. Otherwise encouraged f/u PCP.  3. Chronic HFmrEF - appears euvolemic. Stop spironolactone due to worsening dizziness. Continue Imdur, Lasix, potassium. Hold off additional GMDT given dizziness.  4. HTN - follow BP with med change.  5. Sinus bradycardia - f/u TSH and monitor as above. I suspect we will see some bradycardia on his monitor, the question is whether or not it correlates at all with his dizziness.  6. Carotid artery disease - last assessed 2018, repeat carotid duplex given dizziness.    Disposition: F/u with Dr. Domenic Polite or me in 3-4 months. Recommend close f/u PCP as well.    Medication Adjustments/Labs and Tests Ordered: Current medicines are reviewed at length with the patient today.  Concerns regarding medicines are outlined above. Medication changes, Labs and Tests ordered today are summarized above and listed in the Patient Instructions accessible in Encounters.    Signed, Charlie Pitter, PA-C  09/02/2022 2:46 PM    [facility] in [place]. [place], [place] [address] Ph: [phone]; Fax [phone]

## 2022-09-02 ENCOUNTER — Other Ambulatory Visit
Admission: RE | Admit: 2022-09-02 | Discharge: 2022-09-02 | Disposition: A | Discharge status: Home / Self Care | Payer: Medicare HMO | Source: Ambulatory Visit | Attending: Physician Assistant | Admitting: Physician Assistant

## 2022-09-02 ENCOUNTER — Ambulatory Visit: Payer: Medicare HMO | Admitting: Physician Assistant

## 2022-09-02 ENCOUNTER — Ambulatory Visit (INDEPENDENT_AMBULATORY_CARE_PROVIDER_SITE_OTHER): Payer: Medicare HMO

## 2022-09-02 ENCOUNTER — Encounter: Payer: Self-pay | Admitting: Physician Assistant

## 2022-09-02 VITALS — BP 126/58 | HR 62 | Ht 70.0 in | Wt 207.0 lb

## 2022-09-02 DIAGNOSIS — E785 Hyperlipidemia, unspecified: Secondary | ICD-10-CM

## 2022-09-02 DIAGNOSIS — I5022 Chronic systolic (congestive) heart failure: Secondary | ICD-10-CM

## 2022-09-02 DIAGNOSIS — I6523 Occlusion and stenosis of bilateral carotid arteries: Secondary | ICD-10-CM | POA: Diagnosis not present

## 2022-09-02 DIAGNOSIS — I2510 Atherosclerotic heart disease of native coronary artery without angina pectoris: Secondary | ICD-10-CM | POA: Insufficient documentation

## 2022-09-02 DIAGNOSIS — R001 Bradycardia, unspecified: Secondary | ICD-10-CM

## 2022-09-02 DIAGNOSIS — R42 Dizziness and giddiness: Secondary | ICD-10-CM | POA: Insufficient documentation

## 2022-09-02 DIAGNOSIS — I10 Essential (primary) hypertension: Secondary | ICD-10-CM

## 2022-09-02 LAB — CBC
HCT: 42.1 % (ref 39.0–52.0)
Hemoglobin: 13.9 g/dL (ref 13.0–17.0)
MCH: 31.2 pg (ref 26.0–34.0)
MCHC: 33 g/dL (ref 30.0–36.0)
MCV: 94.4 fL (ref 80.0–100.0)
Platelets: 213 10*3/uL (ref 150–400)
RBC: 4.46 MIL/uL (ref 4.22–5.81)
RDW: 13.2 % (ref 11.5–15.5)
WBC: 9.7 10*3/uL (ref 4.0–10.5)
nRBC: 0 % (ref 0.0–0.2)

## 2022-09-02 LAB — BASIC METABOLIC PANEL
Anion gap: 7 (ref 5–15)
BUN: 15 mg/dL (ref 8–23)
CO2: 27 mmol/L (ref 22–32)
Calcium: 9.1 mg/dL (ref 8.9–10.3)
Chloride: 102 mmol/L (ref 98–111)
Creatinine, Ser: 1.45 mg/dL — ABNORMAL HIGH (ref 0.61–1.24)
GFR, Estimated: 47 mL/min — ABNORMAL LOW (ref >60)
Glucose, Bld: 169 mg/dL — ABNORMAL HIGH (ref 70–99)
Potassium: 3.6 mmol/L (ref 3.5–5.1)
Sodium: 136 mmol/L (ref 135–145)

## 2022-09-02 LAB — TSH: TSH: 0.982 u[IU]/mL (ref 0.350–4.500)

## 2022-09-02 NOTE — Patient Instructions (Signed)
Medication Instructions:  Your physician has recommended you make the following change in your medication:   Stop Taking Spironolactone   *If you need a refill on your cardiac medications before your next appointment, please call your pharmacy*   Lab Work: Your physician recommends that you return for lab work in: Today   If you have labs (blood work) drawn today and your tests are completely normal, you will receive your results only by: [facility] (if you have MyChart) OR A paper copy in the mail If you have any lab test that is abnormal or we need to change your treatment, we will call you to review the results.   Testing/Procedures: Your physician has requested that you have a carotid duplex. This test is an ultrasound of the carotid arteries in your neck. It looks at blood flow through these arteries that supply the brain with blood. Allow one hour for this exam. There are no restrictions or special instructions.    Follow-Up: At [facility], you and your health needs are our priority.  As part of our continuing mission to provide you with exceptional heart care, we have created designated Provider Care Teams.  These Care Teams include your primary Cardiologist (physician) and Advanced Practice Providers (APPs -  Physician Assistants and Nurse Practitioners) who all work together to provide you with the care you need, when you need it.  We recommend signing up for the patient portal called "MyChart".  Sign up information is provided on this After Visit Summary.  MyChart is used to connect with patients for Virtual Visits (Telemedicine).  Patients are able to view lab/test results, encounter notes, upcoming appointments, etc.  Non-urgent messages can be sent to your provider as well.   To learn more about what you can do with MyChart, go to [email or web address].    Your next appointment:   3-4 month(s)  The format for your next appointment:   In  Person  Provider:   Rozann Lesches, MD or Melina Copa, PA-C     Other Instructions Thank you for choosing [facility]!    Important Information About Sugar

## 2022-09-06 ENCOUNTER — Telehealth: Payer: Self-pay

## 2022-09-06 DIAGNOSIS — Z79899 Other long term (current) drug therapy: Secondary | ICD-10-CM

## 2022-09-06 NOTE — Telephone Encounter (Signed)
I spoke with Dustin [name] and she will increase Dustin [name]'s gently over the next 2 days. Repeat bmet this week 9/15 at [facility]

## 2022-09-06 NOTE — Telephone Encounter (Signed)
-----   Message from Charlie Pitter, [place] sent at 09/02/2022  4:43 PM EDT ----- Labs show rise in kidney function after starting spironolactone which we stopped today. Increase fluid intake gently over the next 2 days, recheck BMET 1 week to trend. This can also be done at PCP if he wishes.

## 2022-09-10 ENCOUNTER — Other Ambulatory Visit (HOSPITAL_COMMUNITY)
Admission: RE | Admit: 2022-09-10 | Discharge: 2022-09-10 | Disposition: A | Discharge status: Home / Self Care | Payer: Medicare HMO | Source: Ambulatory Visit | Attending: Physician Assistant | Admitting: Physician Assistant

## 2022-09-10 DIAGNOSIS — Z79899 Other long term (current) drug therapy: Secondary | ICD-10-CM | POA: Insufficient documentation

## 2022-09-10 LAB — BASIC METABOLIC PANEL
Anion gap: 8 (ref 5–15)
BUN: 15 mg/dL (ref 8–23)
CO2: 29 mmol/L (ref 22–32)
Calcium: 9.1 mg/dL (ref 8.9–10.3)
Chloride: 102 mmol/L (ref 98–111)
Creatinine, Ser: 1.28 mg/dL — ABNORMAL HIGH (ref 0.61–1.24)
GFR, Estimated: 55 mL/min — ABNORMAL LOW (ref >60)
Glucose, Bld: 136 mg/dL — ABNORMAL HIGH (ref 70–99)
Potassium: 4.5 mmol/L (ref 3.5–5.1)
Sodium: 139 mmol/L (ref 135–145)

## 2022-09-12 DIAGNOSIS — G9341 Metabolic encephalopathy: Secondary | ICD-10-CM | POA: Diagnosis not present

## 2022-09-12 DIAGNOSIS — J1282 Pneumonia due to coronavirus disease 2019: Secondary | ICD-10-CM | POA: Diagnosis not present

## 2022-09-12 DIAGNOSIS — U071 COVID-19: Secondary | ICD-10-CM | POA: Diagnosis not present

## 2022-09-12 DIAGNOSIS — N179 Acute kidney failure, unspecified: Secondary | ICD-10-CM | POA: Diagnosis not present

## 2022-09-13 ENCOUNTER — Ambulatory Visit (HOSPITAL_COMMUNITY)
Admission: RE | Admit: 2022-09-13 | Discharge: 2022-09-13 | Disposition: A | Discharge status: Home / Self Care | Payer: Medicare HMO | Source: Ambulatory Visit | Attending: Physician Assistant | Admitting: Physician Assistant

## 2022-09-13 DIAGNOSIS — Z6830 Body mass index (BMI) 30.0-30.9, adult: Secondary | ICD-10-CM | POA: Diagnosis not present

## 2022-09-13 DIAGNOSIS — N179 Acute kidney failure, unspecified: Secondary | ICD-10-CM | POA: Diagnosis not present

## 2022-09-13 DIAGNOSIS — I6523 Occlusion and stenosis of bilateral carotid arteries: Secondary | ICD-10-CM | POA: Insufficient documentation

## 2022-09-13 DIAGNOSIS — J42 Unspecified chronic bronchitis: Secondary | ICD-10-CM | POA: Diagnosis not present

## 2022-09-13 DIAGNOSIS — U071 COVID-19: Secondary | ICD-10-CM | POA: Diagnosis not present

## 2022-09-13 DIAGNOSIS — M1990 Unspecified osteoarthritis, unspecified site: Secondary | ICD-10-CM | POA: Diagnosis not present

## 2022-09-13 DIAGNOSIS — F0280 Dementia in other diseases classified elsewhere without behavioral disturbance: Secondary | ICD-10-CM | POA: Diagnosis not present

## 2022-09-13 DIAGNOSIS — J1282 Pneumonia due to coronavirus disease 2019: Secondary | ICD-10-CM | POA: Diagnosis not present

## 2022-09-13 DIAGNOSIS — J449 Chronic obstructive pulmonary disease, unspecified: Secondary | ICD-10-CM | POA: Diagnosis not present

## 2022-09-13 DIAGNOSIS — G894 Chronic pain syndrome: Secondary | ICD-10-CM | POA: Diagnosis not present

## 2022-09-13 DIAGNOSIS — I5032 Chronic diastolic (congestive) heart failure: Secondary | ICD-10-CM | POA: Diagnosis not present

## 2022-09-13 DIAGNOSIS — G9341 Metabolic encephalopathy: Secondary | ICD-10-CM | POA: Diagnosis not present

## 2022-09-13 DIAGNOSIS — M48061 Spinal stenosis, lumbar region without neurogenic claudication: Secondary | ICD-10-CM | POA: Diagnosis not present

## 2022-09-14 DIAGNOSIS — R001 Bradycardia, unspecified: Secondary | ICD-10-CM | POA: Diagnosis not present

## 2022-09-14 DIAGNOSIS — R42 Dizziness and giddiness: Secondary | ICD-10-CM | POA: Diagnosis not present

## 2022-09-15 ENCOUNTER — Telehealth: Payer: Self-pay

## 2022-09-15 NOTE — Telephone Encounter (Signed)
Patient notified and verbalized understanding. Patient had no questions or concerns at this time. PCP copied 

## 2022-09-15 NOTE — Telephone Encounter (Signed)
-----   Message from Dustin [name], [place] sent at 09/15/2022 10:45 AM EDT ----- Please let patient kow monitor overall reassuring. Very brief episodes of skips in heartbeat,no significant bradycardia. would not expect this to cause his perpetual dizziness so again recommend f/u PCP. Otherwise keep f/u as planned.

## 2022-10-13 ENCOUNTER — Ambulatory Visit (HOSPITAL_COMMUNITY)
Admission: RE | Admit: 2022-10-13 | Discharge: 2022-10-13 | Disposition: A | Discharge status: Home / Self Care | Payer: Medicare HMO | Source: Ambulatory Visit | Attending: Internal Medicine | Admitting: Internal Medicine

## 2022-10-13 ENCOUNTER — Other Ambulatory Visit (HOSPITAL_COMMUNITY): Payer: Self-pay | Admitting: Internal Medicine

## 2022-10-13 DIAGNOSIS — E063 Autoimmune thyroiditis: Secondary | ICD-10-CM | POA: Diagnosis not present

## 2022-10-13 DIAGNOSIS — J449 Chronic obstructive pulmonary disease, unspecified: Secondary | ICD-10-CM | POA: Diagnosis not present

## 2022-10-13 DIAGNOSIS — F0280 Dementia in other diseases classified elsewhere without behavioral disturbance: Secondary | ICD-10-CM | POA: Diagnosis not present

## 2022-10-13 DIAGNOSIS — M79604 Pain in right leg: Secondary | ICD-10-CM | POA: Diagnosis not present

## 2022-10-13 DIAGNOSIS — M48061 Spinal stenosis, lumbar region without neurogenic claudication: Secondary | ICD-10-CM | POA: Diagnosis not present

## 2022-10-13 DIAGNOSIS — G894 Chronic pain syndrome: Secondary | ICD-10-CM | POA: Diagnosis not present

## 2022-10-13 DIAGNOSIS — M25571 Pain in right ankle and joints of right foot: Secondary | ICD-10-CM | POA: Insufficient documentation

## 2022-10-13 DIAGNOSIS — M79661 Pain in right lower leg: Secondary | ICD-10-CM | POA: Diagnosis not present

## 2022-10-13 DIAGNOSIS — Z6830 Body mass index (BMI) 30.0-30.9, adult: Secondary | ICD-10-CM | POA: Diagnosis not present

## 2022-10-13 DIAGNOSIS — M7989 Other specified soft tissue disorders: Secondary | ICD-10-CM | POA: Diagnosis not present

## 2022-10-13 DIAGNOSIS — I5032 Chronic diastolic (congestive) heart failure: Secondary | ICD-10-CM | POA: Diagnosis not present

## 2022-10-13 DIAGNOSIS — S82831A Other fracture of upper and lower end of right fibula, initial encounter for closed fracture: Secondary | ICD-10-CM | POA: Diagnosis not present

## 2022-10-21 DIAGNOSIS — M25571 Pain in right ankle and joints of right foot: Secondary | ICD-10-CM | POA: Diagnosis not present

## 2022-11-08 DIAGNOSIS — M25571 Pain in right ankle and joints of right foot: Secondary | ICD-10-CM | POA: Diagnosis not present

## 2022-11-15 DIAGNOSIS — G894 Chronic pain syndrome: Secondary | ICD-10-CM | POA: Diagnosis not present

## 2022-11-15 DIAGNOSIS — E6609 Other obesity due to excess calories: Secondary | ICD-10-CM | POA: Diagnosis not present

## 2022-11-15 DIAGNOSIS — I5032 Chronic diastolic (congestive) heart failure: Secondary | ICD-10-CM | POA: Diagnosis not present

## 2022-11-15 DIAGNOSIS — F0390 Unspecified dementia without behavioral disturbance: Secondary | ICD-10-CM | POA: Diagnosis not present

## 2022-11-15 DIAGNOSIS — Z6830 Body mass index (BMI) 30.0-30.9, adult: Secondary | ICD-10-CM | POA: Diagnosis not present

## 2022-11-15 DIAGNOSIS — E063 Autoimmune thyroiditis: Secondary | ICD-10-CM | POA: Diagnosis not present

## 2022-11-15 DIAGNOSIS — J449 Chronic obstructive pulmonary disease, unspecified: Secondary | ICD-10-CM | POA: Diagnosis not present

## 2022-11-15 DIAGNOSIS — F0280 Dementia in other diseases classified elsewhere without behavioral disturbance: Secondary | ICD-10-CM | POA: Diagnosis not present

## 2022-11-15 DIAGNOSIS — M48061 Spinal stenosis, lumbar region without neurogenic claudication: Secondary | ICD-10-CM | POA: Diagnosis not present

## 2022-11-22 ENCOUNTER — Other Ambulatory Visit: Payer: Self-pay

## 2022-11-22 ENCOUNTER — Encounter (HOSPITAL_COMMUNITY): Payer: Self-pay | Admitting: Emergency Medicine

## 2022-11-22 ENCOUNTER — Observation Stay (HOSPITAL_COMMUNITY)
Admission: EM | Admit: 2022-11-22 | Discharge: 2022-11-24 | Disposition: A | Discharge status: Home Health | Payer: Medicare HMO | Attending: Family Medicine | Admitting: Family Medicine

## 2022-11-22 ENCOUNTER — Emergency Department (HOSPITAL_COMMUNITY): Payer: Medicare HMO

## 2022-11-22 DIAGNOSIS — E039 Hypothyroidism, unspecified: Secondary | ICD-10-CM | POA: Diagnosis present

## 2022-11-22 DIAGNOSIS — Z951 Presence of aortocoronary bypass graft: Secondary | ICD-10-CM | POA: Insufficient documentation

## 2022-11-22 DIAGNOSIS — Z955 Presence of coronary angioplasty implant and graft: Secondary | ICD-10-CM | POA: Insufficient documentation

## 2022-11-22 DIAGNOSIS — R079 Chest pain, unspecified: Secondary | ICD-10-CM | POA: Diagnosis present

## 2022-11-22 DIAGNOSIS — J449 Chronic obstructive pulmonary disease, unspecified: Secondary | ICD-10-CM | POA: Diagnosis not present

## 2022-11-22 DIAGNOSIS — I5033 Acute on chronic diastolic (congestive) heart failure: Secondary | ICD-10-CM | POA: Insufficient documentation

## 2022-11-22 DIAGNOSIS — Z79899 Other long term (current) drug therapy: Secondary | ICD-10-CM | POA: Insufficient documentation

## 2022-11-22 DIAGNOSIS — R001 Bradycardia, unspecified: Secondary | ICD-10-CM | POA: Insufficient documentation

## 2022-11-22 DIAGNOSIS — R0789 Other chest pain: Secondary | ICD-10-CM | POA: Diagnosis not present

## 2022-11-22 DIAGNOSIS — F0280 Dementia in other diseases classified elsewhere without behavioral disturbance: Secondary | ICD-10-CM | POA: Diagnosis present

## 2022-11-22 DIAGNOSIS — Z8616 Personal history of COVID-19: Secondary | ICD-10-CM | POA: Insufficient documentation

## 2022-11-22 DIAGNOSIS — G309 Alzheimer's disease, unspecified: Secondary | ICD-10-CM | POA: Insufficient documentation

## 2022-11-22 DIAGNOSIS — I25110 Atherosclerotic heart disease of native coronary artery with unstable angina pectoris: Secondary | ICD-10-CM | POA: Diagnosis not present

## 2022-11-22 DIAGNOSIS — I200 Unstable angina: Secondary | ICD-10-CM

## 2022-11-22 DIAGNOSIS — J9811 Atelectasis: Secondary | ICD-10-CM | POA: Diagnosis not present

## 2022-11-22 DIAGNOSIS — Z7902 Long term (current) use of antithrombotics/antiplatelets: Secondary | ICD-10-CM | POA: Diagnosis not present

## 2022-11-22 DIAGNOSIS — Z87891 Personal history of nicotine dependence: Secondary | ICD-10-CM | POA: Diagnosis not present

## 2022-11-22 DIAGNOSIS — R4182 Altered mental status, unspecified: Secondary | ICD-10-CM | POA: Insufficient documentation

## 2022-11-22 DIAGNOSIS — R0602 Shortness of breath: Secondary | ICD-10-CM | POA: Diagnosis not present

## 2022-11-22 DIAGNOSIS — E785 Hyperlipidemia, unspecified: Secondary | ICD-10-CM | POA: Diagnosis present

## 2022-11-22 DIAGNOSIS — I110 Hypertensive heart disease with heart failure: Secondary | ICD-10-CM | POA: Insufficient documentation

## 2022-11-22 DIAGNOSIS — R140 Abdominal distension (gaseous): Secondary | ICD-10-CM | POA: Diagnosis not present

## 2022-11-22 DIAGNOSIS — I2510 Atherosclerotic heart disease of native coronary artery without angina pectoris: Secondary | ICD-10-CM | POA: Diagnosis present

## 2022-11-22 DIAGNOSIS — I10 Essential (primary) hypertension: Secondary | ICD-10-CM | POA: Diagnosis present

## 2022-11-22 DIAGNOSIS — J9601 Acute respiratory failure with hypoxia: Secondary | ICD-10-CM

## 2022-11-22 DIAGNOSIS — R059 Cough, unspecified: Secondary | ICD-10-CM | POA: Diagnosis not present

## 2022-11-22 DIAGNOSIS — I5032 Chronic diastolic (congestive) heart failure: Secondary | ICD-10-CM | POA: Diagnosis present

## 2022-11-22 NOTE — ED Triage Notes (Addendum)
Pt bib son for c/o chest pain-pt cannot state why he is here, just that he feels unwell. Son states pt has been having severe chest pain x 2 days. Son states pt has "been eating NTG tabs more than normal" and has hx of "several bypasses".

## 2022-11-22 NOTE — ED Provider Notes (Signed)
[facility] EMERGENCY DEPARTMENT Provider Note   CSN: [id number] Arrival date & time: 11/22/22  2333     History {Add pertinent medical, surgical, social history, OB history to HPI:1} Chief Complaint  Patient presents with   Chest Pain    Dustin [name] is a 88 y.o. male.  Patient brought to the emergency department for evaluation of multiple complaints.  Patient is here with his son who provides information.  Patient reportedly has been complaining of chest pain for the last 2 days, taking nitroglycerin frequently for this pain.  He does have a history of coronary artery disease, status post bypass about 20 years ago.  Patient noted to be confused today.  Son reports that he is "not talking right".  He is a little agitated at arrival and does not seem to completely understand questions that are asked of him.  He reportedly has had cough and congestion for the last 2 or 3 days.       Home Medications Prior to Admission medications   Medication Sig Start Date End Date Taking? Authorizing Provider  albuterol (PROVENTIL) (2.5 MG/3ML) 0.083% nebulizer solution INHALE 1 VIAL VIA NEBULIZER EVERY 6 HOURS AS NEEDED FOR WHEEZING OR SHORT OF BREATH. 06/14/22   Chesley Mires, MD  albuterol (VENTOLIN HFA) 108 (90 Base) MCG/ACT inhaler Inhale 2 puffs into the lungs every 6 (six) hours as needed for wheezing or shortness of breath. 12/31/21   Chesley Mires, MD  alfuzosin (UROXATRAL) 10 MG 24 hr tablet Take 1 tablet (10 mg total) by mouth daily with breakfast. 03/10/22   McKenzie, Candee Furbish, MD  Budeson-Glycopyrrol-Formoterol (BREZTRI AEROSPHERE) 160-9-4.8 MCG/ACT AERO Inhale 2 puffs into the lungs in the morning and at bedtime. 12/31/21   Chesley Mires, MD  dutasteride (AVODART) 0.5 MG capsule Take 1 capsule (0.5 mg total) by mouth daily. In the afternoon 03/10/22   Cleon Gustin, MD  escitalopram (LEXAPRO) 20 MG tablet Take 20 mg by mouth every morning.     [provider]  furosemide  (LASIX) 20 MG tablet Take 20 mg by mouth 2 (two) times daily. 07/14/22   [provider]  isosorbide mononitrate (IMDUR) 60 MG 24 hr tablet Take 120 mg by mouth daily. 02/17/21   [provider]  levothyroxine (SYNTHROID) 75 MCG tablet Take 75 mcg by mouth daily. 02/23/22   [provider]  memantine (NAMENDA) 10 MG tablet Take 10 mg by mouth 2 (two) times daily.  03/05/16   [provider]  nitroGLYCERIN (NITROSTAT) 0.4 MG SL tablet Place 1 tablet (0.4 mg total) under the tongue every 5 (five) minutes as needed for chest pain. Pt has bought nitro bottle from home and oxycodone 30 mg ( 3 or 4 tabs) is in nitro bottle per wife Gerald Kuehl. Patient taking differently: Place 0.4 mg under the tongue every 5 (five) minutes as needed for chest pain. 11/04/20   Satira Sark, MD  oxycodone (ROXICODONE) 30 MG immediate release tablet Take 30 mg by mouth every 4 (four) hours as needed. 09/02/21   [provider]  potassium chloride SA (K-DUR,KLOR-CON) 20 MEQ tablet Take 20 mEq by mouth daily.     [provider]  QUEtiapine (SEROQUEL) 25 MG tablet Take 25 mg by mouth at bedtime as needed (sleep). 11/19/19   [provider]  ranolazine (RANEXA) 500 MG 12 hr tablet Take 1 tablet (500 mg total) by mouth 2 (two) times daily. 08/11/22 08/06/23  Deatra James, MD  rosuvastatin (  CRESTOR) 40 MG tablet Take 1 tablet (40 mg total) by mouth daily at 6 PM. 12/11/19   Hollice Gong, Mir Earlie Server, MD      Allergies    Patient has no known allergies.    Review of Systems   Review of Systems  Physical Exam Updated Vital Signs BP (!) 147/48 (BP Location: Left Arm)   Pulse 71   Temp 97.8 F (36.6 C) (Oral)   Resp (!) 21   Ht '5\' 10"'$  (1.778 m)   Wt 94 kg   SpO2 93%   BMI 29.73 kg/m  Physical Exam Vitals and nursing note reviewed.  Constitutional:      General: He is not in acute distress.    Appearance: He is well-developed.  HENT:     Head:  Normocephalic and atraumatic.     Mouth/Throat:     Mouth: Mucous membranes are moist.  Eyes:     General: Vision grossly intact. Gaze aligned appropriately.     Extraocular Movements: Extraocular movements intact.     Conjunctiva/sclera: Conjunctivae normal.  Cardiovascular:     Rate and Rhythm: Normal rate and regular rhythm.     Pulses: Normal pulses.     Heart sounds: Normal heart sounds, S1 normal and S2 normal. No murmur heard.    No friction rub. No gallop.  Pulmonary:     Effort: Pulmonary effort is normal. No respiratory distress.     Breath sounds: Normal breath sounds.  Abdominal:     Palpations: Abdomen is soft.     Tenderness: There is no abdominal tenderness. There is no guarding or rebound.     Hernia: No hernia is present.  Musculoskeletal:        General: No swelling.     Cervical back: Full passive range of motion without pain, normal range of motion and neck supple. No pain with movement, spinous process tenderness or muscular tenderness. Normal range of motion.     Right lower leg: No edema.     Left lower leg: No edema.  Skin:    General: Skin is warm and dry.     Capillary Refill: Capillary refill takes less than 2 seconds.     Findings: No ecchymosis, erythema, lesion or wound.  Neurological:     Mental Status: He is alert. He is confused.     GCS: GCS eye subscore is 4. GCS verbal subscore is 5. GCS motor subscore is 6.     Cranial Nerves: Cranial nerves 2-12 are intact.     Sensory: Sensation is intact.     Motor: Motor function is intact. No weakness or abnormal muscle tone.     Coordination: Coordination is intact.  Psychiatric:        Mood and Affect: Mood normal.        Speech: Speech normal.        Behavior: Behavior normal.     ED Results / Procedures / Treatments   Labs (all labs ordered are listed, but only abnormal results are displayed) Labs Reviewed  CBC WITH DIFFERENTIAL/PLATELET  COMPREHENSIVE METABOLIC PANEL  LACTIC ACID, PLASMA   LACTIC ACID, PLASMA  BRAIN NATRIURETIC PEPTIDE  URINALYSIS, ROUTINE W REFLEX MICROSCOPIC  TROPONIN I (HIGH SENSITIVITY)    EKG None  Radiology No results found.  Procedures Procedures  {Document cardiac monitor, telemetry assessment procedure when appropriate:1}  Medications Ordered in ED Medications - No data to display  ED Course/ Medical Decision Making/ A&P  Medical Decision Making Amount and/or Complexity of Data Reviewed Labs: ordered. Radiology: ordered.   ***  {Document critical care time when appropriate:1} {Document review of labs and clinical decision tools ie heart score, Chads2Vasc2 etc:1}  {Document your independent review of radiology images, and any outside records:1} {Document your discussion with family members, caretakers, and with consultants:1} {Document social determinants of health affecting pt's care:1} {Document your decision making why or why not admission, treatments were needed:1} Final Clinical Impression(s) / ED Diagnoses Final diagnoses:  None    Rx / DC Orders ED Discharge Orders     None

## 2022-11-23 ENCOUNTER — Emergency Department (HOSPITAL_COMMUNITY): Payer: Medicare HMO

## 2022-11-23 ENCOUNTER — Other Ambulatory Visit (HOSPITAL_COMMUNITY): Payer: Medicare HMO

## 2022-11-23 ENCOUNTER — Encounter (HOSPITAL_COMMUNITY): Payer: Self-pay | Admitting: Radiology

## 2022-11-23 ENCOUNTER — Other Ambulatory Visit (HOSPITAL_COMMUNITY): Payer: Self-pay | Admitting: *Deleted

## 2022-11-23 ENCOUNTER — Observation Stay (HOSPITAL_BASED_OUTPATIENT_CLINIC_OR_DEPARTMENT_OTHER): Payer: Medicare HMO

## 2022-11-23 ENCOUNTER — Observation Stay (HOSPITAL_COMMUNITY): Payer: Medicare HMO

## 2022-11-23 DIAGNOSIS — F0280 Dementia in other diseases classified elsewhere without behavioral disturbance: Secondary | ICD-10-CM | POA: Diagnosis not present

## 2022-11-23 DIAGNOSIS — I25709 Atherosclerosis of coronary artery bypass graft(s), unspecified, with unspecified angina pectoris: Secondary | ICD-10-CM

## 2022-11-23 DIAGNOSIS — I5032 Chronic diastolic (congestive) heart failure: Secondary | ICD-10-CM | POA: Diagnosis not present

## 2022-11-23 DIAGNOSIS — R0789 Other chest pain: Secondary | ICD-10-CM | POA: Diagnosis present

## 2022-11-23 DIAGNOSIS — N200 Calculus of kidney: Secondary | ICD-10-CM | POA: Diagnosis not present

## 2022-11-23 DIAGNOSIS — I5033 Acute on chronic diastolic (congestive) heart failure: Secondary | ICD-10-CM | POA: Diagnosis not present

## 2022-11-23 DIAGNOSIS — G309 Alzheimer's disease, unspecified: Secondary | ICD-10-CM

## 2022-11-23 DIAGNOSIS — I5031 Acute diastolic (congestive) heart failure: Secondary | ICD-10-CM

## 2022-11-23 DIAGNOSIS — R059 Cough, unspecified: Secondary | ICD-10-CM | POA: Diagnosis not present

## 2022-11-23 DIAGNOSIS — R140 Abdominal distension (gaseous): Secondary | ICD-10-CM | POA: Diagnosis not present

## 2022-11-23 DIAGNOSIS — J9811 Atelectasis: Secondary | ICD-10-CM | POA: Diagnosis not present

## 2022-11-23 DIAGNOSIS — R0602 Shortness of breath: Secondary | ICD-10-CM | POA: Diagnosis not present

## 2022-11-23 DIAGNOSIS — N281 Cyst of kidney, acquired: Secondary | ICD-10-CM | POA: Diagnosis not present

## 2022-11-23 DIAGNOSIS — R4182 Altered mental status, unspecified: Secondary | ICD-10-CM | POA: Diagnosis not present

## 2022-11-23 LAB — COMPREHENSIVE METABOLIC PANEL
ALT: 15 U/L (ref 0–44)
AST: 22 U/L (ref 15–41)
Albumin: 3.5 g/dL (ref 3.5–5.0)
Alkaline Phosphatase: 68 U/L (ref 38–126)
Anion gap: 8 (ref 5–15)
BUN: 26 mg/dL — ABNORMAL HIGH (ref 8–23)
CO2: 26 mmol/L (ref 22–32)
Calcium: 8.6 mg/dL — ABNORMAL LOW (ref 8.9–10.3)
Chloride: 102 mmol/L (ref 98–111)
Creatinine, Ser: 1.35 mg/dL — ABNORMAL HIGH (ref 0.61–1.24)
GFR, Estimated: 51 mL/min — ABNORMAL LOW (ref >60)
Glucose, Bld: 131 mg/dL — ABNORMAL HIGH (ref 70–99)
Potassium: 3.8 mmol/L (ref 3.5–5.1)
Sodium: 136 mmol/L (ref 135–145)
Total Bilirubin: 0.9 mg/dL (ref 0.3–1.2)
Total Protein: 6.7 g/dL (ref 6.5–8.1)

## 2022-11-23 LAB — ECHOCARDIOGRAM LIMITED
Area-P 1/2: 3.53 cm2
Height: 70 in
S' Lateral: 3.6 cm
Weight: 3410.96 oz

## 2022-11-23 LAB — CBC
HCT: 33.4 % — ABNORMAL LOW (ref 39.0–52.0)
Hemoglobin: 10.8 g/dL — ABNORMAL LOW (ref 13.0–17.0)
MCH: 30.9 pg (ref 26.0–34.0)
MCHC: 32.3 g/dL (ref 30.0–36.0)
MCV: 95.4 fL (ref 80.0–100.0)
Platelets: 174 10*3/uL (ref 150–400)
RBC: 3.5 MIL/uL — ABNORMAL LOW (ref 4.22–5.81)
RDW: 14.4 % (ref 11.5–15.5)
WBC: 5.3 10*3/uL (ref 4.0–10.5)
nRBC: 0 % (ref 0.0–0.2)

## 2022-11-23 LAB — URINALYSIS, ROUTINE W REFLEX MICROSCOPIC
Bacteria, UA: NONE SEEN
Bilirubin Urine: NEGATIVE
Glucose, UA: NEGATIVE mg/dL
Hgb urine dipstick: NEGATIVE
Ketones, ur: NEGATIVE mg/dL
Leukocytes,Ua: NEGATIVE
Nitrite: NEGATIVE
Protein, ur: 100 mg/dL — AB
Specific Gravity, Urine: 1.019 (ref 1.005–1.030)
pH: 5 (ref 5.0–8.0)

## 2022-11-23 LAB — CBC WITH DIFFERENTIAL/PLATELET
Abs Immature Granulocytes: 0.01 10*3/uL (ref 0.00–0.07)
Basophils Absolute: 0 10*3/uL (ref 0.0–0.1)
Basophils Relative: 0 %
Eosinophils Absolute: 0 10*3/uL (ref 0.0–0.5)
Eosinophils Relative: 1 %
HCT: 32.4 % — ABNORMAL LOW (ref 39.0–52.0)
Hemoglobin: 10.6 g/dL — ABNORMAL LOW (ref 13.0–17.0)
Immature Granulocytes: 0 %
Lymphocytes Relative: 41 %
Lymphs Abs: 1.9 10*3/uL (ref 0.7–4.0)
MCH: 31.2 pg (ref 26.0–34.0)
MCHC: 32.7 g/dL (ref 30.0–36.0)
MCV: 95.3 fL (ref 80.0–100.0)
Monocytes Absolute: 0.8 10*3/uL (ref 0.1–1.0)
Monocytes Relative: 18 %
Neutro Abs: 1.8 10*3/uL (ref 1.7–7.7)
Neutrophils Relative %: 40 %
Platelets: 178 10*3/uL (ref 150–400)
RBC: 3.4 MIL/uL — ABNORMAL LOW (ref 4.22–5.81)
RDW: 14.4 % (ref 11.5–15.5)
WBC: 4.6 10*3/uL (ref 4.0–10.5)
nRBC: 0 % (ref 0.0–0.2)

## 2022-11-23 LAB — CREATININE, SERUM
Creatinine, Ser: 1.15 mg/dL (ref 0.61–1.24)
GFR, Estimated: >60 mL/min (ref >60)

## 2022-11-23 LAB — TROPONIN I (HIGH SENSITIVITY)
Troponin I (High Sensitivity): 14 ng/L (ref <18)
Troponin I (High Sensitivity): 18 ng/L — ABNORMAL HIGH (ref <18)

## 2022-11-23 LAB — LACTIC ACID, PLASMA: Lactic Acid, Venous: 1.6 mmol/L (ref 0.5–1.9)

## 2022-11-23 LAB — BRAIN NATRIURETIC PEPTIDE: B Natriuretic Peptide: 150 pg/mL — ABNORMAL HIGH (ref 0.0–100.0)

## 2022-11-23 MED ORDER — ACETAMINOPHEN 325 MG PO TABS
650.0000 mg | ORAL_TABLET | Freq: Four times a day (QID) | ORAL | Status: DC | PRN
  Administered 2022-11-23 – 2022-11-24 (×3): 650 mg via ORAL
  Filled 2022-11-23 (×2): qty 2

## 2022-11-23 MED ORDER — RANOLAZINE ER 500 MG PO TB12
1000.0000 mg | ORAL_TABLET | Freq: Two times a day (BID) | ORAL | Status: DC
  Administered 2022-11-23 – 2022-11-24 (×2): 1000 mg via ORAL
  Filled 2022-11-23 (×2): qty 2

## 2022-11-23 MED ORDER — ONDANSETRON HCL 4 MG/2ML IJ SOLN
4.0000 mg | Freq: Four times a day (QID) | INTRAMUSCULAR | Status: DC | PRN
  Administered 2022-11-23: 4 mg via INTRAVENOUS
  Filled 2022-11-23: qty 2

## 2022-11-23 MED ORDER — ESCITALOPRAM OXALATE 10 MG PO TABS
20.0000 mg | ORAL_TABLET | Freq: Every morning | ORAL | Status: DC
  Administered 2022-11-23 – 2022-11-24 (×2): 20 mg via ORAL
  Filled 2022-11-23 (×2): qty 2

## 2022-11-23 MED ORDER — LEVOTHYROXINE SODIUM 75 MCG PO TABS
75.0000 ug | ORAL_TABLET | Freq: Every day | ORAL | Status: DC
  Administered 2022-11-23 – 2022-11-24 (×2): 75 ug via ORAL
  Filled 2022-11-23: qty 2
  Filled 2022-11-23: qty 1

## 2022-11-23 MED ORDER — PANTOPRAZOLE SODIUM 40 MG PO TBEC
40.0000 mg | DELAYED_RELEASE_TABLET | Freq: Every day | ORAL | Status: DC
  Administered 2022-11-24: 40 mg via ORAL
  Filled 2022-11-23: qty 1

## 2022-11-23 MED ORDER — ONDANSETRON HCL 4 MG PO TABS: 4.0000 mg | ORAL_TABLET | Freq: Four times a day (QID) | ORAL | Status: DC | PRN

## 2022-11-23 MED ORDER — ROSUVASTATIN CALCIUM 20 MG PO TABS
40.0000 mg | ORAL_TABLET | Freq: Every day | ORAL | Status: DC
  Administered 2022-11-23: 40 mg via ORAL
  Filled 2022-11-23: qty 2

## 2022-11-23 MED ORDER — CLOPIDOGREL BISULFATE 75 MG PO TABS
75.0000 mg | ORAL_TABLET | Freq: Every day | ORAL | Status: DC
  Administered 2022-11-23 – 2022-11-24 (×2): 75 mg via ORAL
  Filled 2022-11-23 (×2): qty 1

## 2022-11-23 MED ORDER — BUDESON-GLYCOPYRROL-FORMOTEROL 160-9-4.8 MCG/ACT IN AERO: 2.0000 | INHALATION_SPRAY | Freq: Two times a day (BID) | RESPIRATORY_TRACT | Status: DC

## 2022-11-23 MED ORDER — IPRATROPIUM-ALBUTEROL 0.5-2.5 (3) MG/3ML IN SOLN
3.0000 mL | Freq: Two times a day (BID) | RESPIRATORY_TRACT | Status: DC
  Administered 2022-11-24: 3 mL via RESPIRATORY_TRACT
  Filled 2022-11-23: qty 3

## 2022-11-23 MED ORDER — ALFUZOSIN HCL ER 10 MG PO TB24
10.0000 mg | ORAL_TABLET | Freq: Every day | ORAL | Status: DC
  Administered 2022-11-24: 10 mg via ORAL
  Filled 2022-11-23: qty 1

## 2022-11-23 MED ORDER — ENOXAPARIN SODIUM 40 MG/0.4ML IJ SOSY
40.0000 mg | PREFILLED_SYRINGE | INTRAMUSCULAR | Status: DC
  Filled 2022-11-23 (×2): qty 0.4

## 2022-11-23 MED ORDER — GUAIFENESIN ER 600 MG PO TB12
600.0000 mg | ORAL_TABLET | Freq: Two times a day (BID) | ORAL | Status: DC
  Administered 2022-11-23 – 2022-11-24 (×3): 600 mg via ORAL
  Filled 2022-11-23 (×3): qty 1

## 2022-11-23 MED ORDER — RANOLAZINE ER 500 MG PO TB12
500.0000 mg | ORAL_TABLET | Freq: Two times a day (BID) | ORAL | Status: DC
  Administered 2022-11-23: 500 mg via ORAL
  Filled 2022-11-23: qty 1

## 2022-11-23 MED ORDER — ISOSORBIDE MONONITRATE ER 60 MG PO TB24
120.0000 mg | ORAL_TABLET | Freq: Every morning | ORAL | Status: DC
  Administered 2022-11-23 – 2022-11-24 (×2): 120 mg via ORAL
  Filled 2022-11-23 (×2): qty 2

## 2022-11-23 MED ORDER — ACETAMINOPHEN 650 MG RE SUPP: 650.0000 mg | Freq: Four times a day (QID) | RECTAL | Status: DC | PRN

## 2022-11-23 MED ORDER — FUROSEMIDE 10 MG/ML IJ SOLN
40.0000 mg | Freq: Every day | INTRAMUSCULAR | Status: DC
  Administered 2022-11-23 – 2022-11-24 (×2): 40 mg via INTRAVENOUS
  Filled 2022-11-23 (×2): qty 4

## 2022-11-23 MED ORDER — IOHEXOL 300 MG/ML  SOLN
100.0000 mL | Freq: Once | INTRAMUSCULAR | Status: AC | PRN
  Administered 2022-11-23: 100 mL via INTRAVENOUS

## 2022-11-23 MED ORDER — POTASSIUM CHLORIDE CRYS ER 20 MEQ PO TBCR
20.0000 meq | EXTENDED_RELEASE_TABLET | Freq: Every day | ORAL | Status: DC
  Administered 2022-11-23 – 2022-11-24 (×2): 20 meq via ORAL
  Filled 2022-11-23 (×2): qty 1

## 2022-11-23 MED ORDER — IPRATROPIUM-ALBUTEROL 0.5-2.5 (3) MG/3ML IN SOLN
3.0000 mL | Freq: Four times a day (QID) | RESPIRATORY_TRACT | Status: DC
  Administered 2022-11-23 (×2): 3 mL via RESPIRATORY_TRACT
  Filled 2022-11-23 (×2): qty 3

## 2022-11-23 MED ORDER — NITROGLYCERIN 0.4 MG SL SUBL: 0.4000 mg | SUBLINGUAL_TABLET | SUBLINGUAL | Status: DC | PRN

## 2022-11-23 MED ORDER — ALBUTEROL SULFATE (2.5 MG/3ML) 0.083% IN NEBU: 2.5000 mg | INHALATION_SOLUTION | RESPIRATORY_TRACT | Status: DC | PRN

## 2022-11-23 MED ORDER — DUTASTERIDE 0.5 MG PO CAPS
0.5000 mg | ORAL_CAPSULE | Freq: Every day | ORAL | Status: DC
  Administered 2022-11-23: 0.5 mg via ORAL
  Filled 2022-11-23 (×4): qty 1

## 2022-11-23 MED ORDER — UMECLIDINIUM BROMIDE 62.5 MCG/ACT IN AEPB
1.0000 | INHALATION_SPRAY | Freq: Every day | RESPIRATORY_TRACT | Status: DC
  Administered 2022-11-24: 1 via RESPIRATORY_TRACT
  Filled 2022-11-23: qty 7

## 2022-11-23 MED ORDER — SODIUM CHLORIDE 0.9 % IV SOLN: INTRAVENOUS | Status: AC

## 2022-11-23 MED ORDER — MOMETASONE FURO-FORMOTEROL FUM 100-5 MCG/ACT IN AERO
2.0000 | INHALATION_SPRAY | Freq: Two times a day (BID) | RESPIRATORY_TRACT | Status: DC
  Administered 2022-11-23 – 2022-11-24 (×2): 2 via RESPIRATORY_TRACT
  Filled 2022-11-23: qty 8.8

## 2022-11-23 MED ORDER — MEMANTINE HCL 10 MG PO TABS
10.0000 mg | ORAL_TABLET | Freq: Two times a day (BID) | ORAL | Status: DC
  Administered 2022-11-23 – 2022-11-24 (×3): 10 mg via ORAL
  Filled 2022-11-23 (×3): qty 1

## 2022-11-23 MED ORDER — OXYCODONE HCL 5 MG PO TABS
15.0000 mg | ORAL_TABLET | Freq: Four times a day (QID) | ORAL | Status: DC | PRN
  Administered 2022-11-23 – 2022-11-24 (×3): 15 mg via ORAL
  Filled 2022-11-23 (×3): qty 3

## 2022-11-23 MED ORDER — QUETIAPINE FUMARATE 25 MG PO TABS
25.0000 mg | ORAL_TABLET | Freq: Every evening | ORAL | Status: DC | PRN
  Administered 2022-11-23: 25 mg via ORAL
  Filled 2022-11-23: qty 1

## 2022-11-23 NOTE — ED Notes (Signed)
Pt up out of bed, sitting in chair. Pt removed all monitoring equipment and does not want to put it back on at this time. Pt has called home using personal cell phone stating he wants to go home

## 2022-11-23 NOTE — ED Notes (Signed)
Lunch tray given to patient

## 2022-11-23 NOTE — Evaluation (Addendum)
Clinical/Bedside Swallow Evaluation Patient Details  Name: Dustin [name] MRN: [id number] Date of Birth: 1934-05-25  Today's Date: 11/23/2022 Time: SLP Start Time (ACUTE ONLY): 48 SLP Stop Time (ACUTE ONLY): 1420 SLP Time Calculation (min) (ACUTE ONLY): 25 min  Past Medical History:  Past Medical History:  Diagnosis Date   Alzheimer disease ([place])    Anxiety    Arthritis    Atrophic gastritis    a. By EGD 02/2013.   Carotid artery disease (HCC)    a. mild-mod plaque, <50% stenosis bilat by duplex 2018.   Coronary atherosclerosis of native coronary artery    a. Multivessel s/p CABG 1996. b. DESx2 to SVG-PDA in 2012 c. DES to distal LM in 11/2019 d. cath in 02/2021 showing patent LM stent, patent LIMA-LAD, patent SVG-OM1 with chronically occluded jump limb to OM2 and chronic occlusion of SVG-D2 and SVG-PDA and no targets for intervention   DDD (degenerative disc disease)    Chronic back pain   Dementia (HCC)    Enlarged prostate    Essential hypertension    Hematuria    Hypothyroidism    LBBB (left bundle branch block)    MI (myocardial infarction) ([place])    Mixed hyperlipidemia    OA (osteoarthritis)    OSA (obstructive sleep apnea)    Pneumonia due to COVID-19 virus    February 2021   Sinus bradycardia    a. Aricept and BB discontinued due to this.   Past Surgical History:  Past Surgical History:  Procedure Laterality Date   CATARACT EXTRACTION W/PHACO Left 03/22/2022   Procedure: CATARACT EXTRACTION PHACO AND INTRAOCULAR LENS PLACEMENT (IOC);  Surgeon: Baruch Goldmann, MD;  Location: [facility];  Service: Ophthalmology;  Laterality: Left;  CDE: 25.22   COLONOSCOPY  08/03/2004   Jenkins-numerous large diverticula in the descending, transverse, descending, and sigmoid colon. Otherwise normal exam.   COLONOSCOPY  07/12/2012   RMR: External hemorrhoidal tag; multiple rectal and colonic polyps removed and/or treated as described above. Pancolonic diverticulosis. Bx-tubular adenomas,  rectal hyperplastic polyp. next colonoscopy in 06/2015.   COLONOSCOPY N/A 06/22/2016   Procedure: COLONOSCOPY;  Surgeon: Aviva Signs, MD;  Location: [facility];  Service: Gastroenterology;  Laterality: N/A;  46   CORONARY ANGIOPLASTY WITH STENT PLACEMENT     CORONARY ARTERY BYPASS GRAFT  1996   LIMA to LAD, SVG to D2, SVG to PDA, SVG to OM1 and OM2   CORONARY STENT INTERVENTION N/A 12/10/2019   Procedure: CORONARY STENT INTERVENTION;  Surgeon: Burnell Blanks, MD;  Location: [place] [facility];  Service: Cardiovascular;  Laterality: N/A;   ESOPHAGOGASTRODUODENOSCOPY N/A 03/16/2013   Procedure: ESOPHAGOGASTRODUODENOSCOPY (EGD);  Surgeon: Daneil Dolin, MD;  Location: [facility];  Service: Endoscopy;  Laterality: N/A;  12:00-moved to [place] notified pt   ESOPHAGOGASTRODUODENOSCOPY N/A 03/06/2013   Procedure: ESOPHAGOGASTRODUODENOSCOPY (EGD);  Surgeon: Daneil Dolin, MD;  Location: [facility];  Service: Endoscopy;  Laterality: N/A;   HERNIA REPAIR     INTRAVASCULAR ULTRASOUND/IVUS N/A 12/10/2019   Procedure: Intravascular Ultrasound/IVUS;  Surgeon: Burnell Blanks, MD;  Location: [place] [facility];  Service: Cardiovascular;  Laterality: N/A;   LEFT HEART CATH AND CORS/GRAFTS ANGIOGRAPHY N/A 12/10/2019   Procedure: LEFT HEART CATH AND CORS/GRAFTS ANGIOGRAPHY;  Surgeon: Burnell Blanks, MD;  Location: [place] [facility];  Service: Cardiovascular;  Laterality: N/A;   LEFT HEART CATH AND CORS/GRAFTS ANGIOGRAPHY N/A 03/02/2021   Procedure: LEFT HEART CATH AND CORS/GRAFTS ANGIOGRAPHY;  Surgeon: End,  Harrell Gave, MD;  Location: [place] [facility];  Service: Cardiovascular;  Laterality: N/A;   HPI:  88 y.o. male with medical history significant of coronary artery disease, hypertension, dementia, chronic pain, BPH, who was brought to the hospital by family due to concerns of confusion and persistent chest pain.  It was reported that patient has been having substernal  to left-sided chest pain with radiation to left shoulder.  Son reports that when he has increased pain, he tends to get more confused.  Reports his oral intake has been normal for him.  Bowel movements have been normal for him. He does describe a chronic cough and is producing tan-colored sputum.  Pt denotes SOB. He says he often gets choked up when trying to eat or drink; CXR on 11/23/22 indicating Mild right basilar atelectasis. BSE generated to assess swallow function.    Assessment / Plan / Recommendation  Clinical Impression  Pt seen for clinical swallowing evaluation with c/o globus sensation and esophageal concerns (ie:"food is sometimes slow going down") with prior BSE completed in 2020 recommending esophageal precautions during meals and regular/thin liquids. Pt denotes SOB/congestion prior to hospitalization.  OME revealed adequate oral-motor strength/coordination, but poor dental hygiene.  Pt stated he "has to chew his food, mainly meat thoroughly and sometimes spits it out."  DIscussed esophageal precautions and breathing/swallowing precautions with pt and recommendation for Dysphagia 3(mechanical soft)/thin liquids with slow rate and small bites/sips during meal intake.  No overt s/sx of aspiration noted with thin via cup/straw, puree and solids.  Delayed cough after entire intake of foods/liquids, but pt was coughing prior to intake as well, so unsure if this is related or not.  Impaired mastication noted with solids d/t poor dental condition.  ST will f/u briefly during acute stay for diet tolerance/education re: swallowing precautions.  May consider MBS if dysphagic symptoms persist.  Thank you for this consult. SLP Visit Diagnosis: Dysphagia, pharyngoesophageal phase (R13.14)    Aspiration Risk  Mild aspiration risk    Diet Recommendation Dysphagia 3 (Mech soft);Thin liquid   Liquid Administration via: Cup;Straw Medication Administration: Whole meds with liquid (or split if too large  with liquids) Supervision: Patient able to self feed;Intermittent supervision to cue for compensatory strategies Compensations: Slow rate;Small sips/bites;Follow solids with liquid Postural Changes: Seated upright at 90 degrees;Remain upright for at least 30 minutes after po intake    Other  Recommendations Recommended Consults: Consider esophageal assessment Oral Care Recommendations: Oral care BID    Recommendations for follow up therapy are one component of a multi-disciplinary discharge planning process, led by the attending physician.  Recommendations may be updated based on patient status, additional functional criteria and insurance authorization.  Follow up Recommendations Other (comment) (MBS if dysphagia persists)      Assistance Recommended at Discharge  Intermittent  Functional Status Assessment Patient has had a recent decline in their functional status and demonstrates the ability to make significant improvements in function in a reasonable and predictable amount of time.  Frequency and Duration min 1 x/week  1 week       Prognosis Prognosis for Safe Diet Advancement: Good      Swallow Study   General Date of Onset: 11/22/22 HPI: 88 y.o. male with medical history significant of coronary artery disease, hypertension, dementia, chronic pain, BPH, who was brought to the hospital by family due to concerns of confusion and persistent chest pain.  It was reported that patient has been having substernal to left-sided chest pain with  radiation to left shoulder.  Son reports that when he has increased pain, he tends to get more confused.  Reports his oral intake has been normal for him.  Bowel movements have been normal for him. He does describe a chronic cough and is producing tan-colored sputum.  Pt denotes SOB. He says he often gets choked up when trying to eat or drink; BSE generated to assess swallow function. Type of Study: Bedside Swallow Evaluation Previous Swallow  Assessment: 12/09/19 BSE recommending esophageal precautjions with meals Diet Prior to this Study: Regular;Thin liquids Temperature Spikes Noted: No Respiratory Status: Room air History of Recent Intubation: No Behavior/Cognition: Alert;Cooperative;Distractible Oral Cavity Assessment: Within Functional Limits Oral Care Completed by SLP: No Oral Cavity - Dentition: Adequate natural dentition;Poor condition Vision: Functional for self-feeding Self-Feeding Abilities: Able to feed self Patient Positioning: Upright in bed Baseline Vocal Quality: Low vocal intensity;Other (comment) (slightly) Volitional Cough: Strong Volitional Swallow: Able to elicit    Oral/Motor/Sensory Function Overall Oral Motor/Sensory Function: Within functional limits   Ice Chips Ice chips: Not tested   Thin Liquid Thin Liquid: Within functional limits Presentation: Cup;Straw Other Comments: encouraged small bites/sips    Nectar Thick Nectar Thick Liquid: Not tested   Honey Thick Honey Thick Liquid: Not tested   Puree Puree: Within functional limits Presentation: Self Fed   Solid     Solid: Impaired Presentation: Self Fed Oral Phase Impairments: Impaired mastication (? d/t poor dental hygiene) Oral Phase Functional Implications: Impaired mastication Pharyngeal Phase Impairments: Cough - Delayed Other Comments: cough prior to PO intake and after solids      Elvina Sidle, M.S., CCC-SLP 11/23/2022,2:53 PM

## 2022-11-23 NOTE — ED Notes (Signed)
Pt's son asked about pt's disposition. Son educated that a decision has not been made and that we still needed a urine sample. Son is going to attempt to get pt to urinate in a urinal.

## 2022-11-23 NOTE — ED Notes (Signed)
Family updated as to patient's status.

## 2022-11-23 NOTE — Evaluation (Signed)
Physical Therapy Evaluation Patient Details Name: Dustin [name] MRN: [id number] DOB: [birth year] Today's Date: 11/23/2022  History of Present Illness  Dustin [name] is a 88 y.o. male with medical history significant of coronary artery disease, hypertension, dementia, chronic pain, BPH, who was brought to the hospital by family due to concerns of confusion and persistent chest pain.  It was reported that patient has been having substernal to left-sided chest pain with radiation to left shoulder.  This has been intermittently occurring for the past few days.  She has been using his nitroglycerin more frequently.  His son reports that when he has increased pain, he tends to get more confused.  Reports his oral intake has been normal for him.  Bowel movements have been normal for him.  He has not had any fever or dysuria.  He does describe a chronic cough and is producing tan-colored sputum.  He says he does feel short of breath.  He says he often gets choked up when trying to eat or drink.   Clinical Impression  Patient functioning near baseline for functional mobility and gait other than having difficulty sitting up from side lying position due to generalized weakness.  Patient ambulated to bathroom and hallway without loss of balance, good return for standing over commode to urinate and wash hands over sink without loss of balance using RW and limited mostly due to c/o fatigue and headache - RN notified.  Patient put back to bed after therapy.  Patient will benefit from continued skilled physical therapy in hospital and recommended venue below to increase strength, balance, endurance for safe ADLs and gait.         Recommendations for follow up therapy are one component of a multi-disciplinary discharge planning process, led by the attending physician.  Recommendations may be updated based on patient status, additional functional criteria and insurance authorization.  Follow Up Recommendations Home  health PT      Assistance Recommended at Discharge Set up Supervision/Assistance  Patient can return home with the following  A little help with walking and/or transfers;A little help with bathing/dressing/bathroom;Assistance with cooking/housework;Help with stairs or ramp for entrance;Direct supervision/assist for medications management    Equipment Recommendations None recommended by PT  Recommendations for Other Services       Functional Status Assessment Patient has had a recent decline in their functional status and demonstrates the ability to make significant improvements in function in a reasonable and predictable amount of time.     Precautions / Restrictions Precautions Precautions: Fall Restrictions Weight Bearing Restrictions: No      Mobility  Bed Mobility Overal bed mobility: Needs Assistance Bed Mobility: Rolling, Sidelying to Sit, Sit to Supine Rolling: Min guard Sidelying to sit: Min assist, Mod assist   Sit to supine: Supervision   General bed mobility comments: had difficulty pushing off with RUE during side lying to sitting    Transfers Overall transfer level: Needs assistance Equipment used: Rolling walker (2 wheels) Transfers: Sit to/from Stand, Bed to chair/wheelchair/BSC Sit to Stand: Supervision   Step pivot transfers: Supervision       General transfer comment: slightly labored movement without loss of balance    Ambulation/Gait Ambulation/Gait assistance: Min guard, Supervision Gait Distance (Feet): 50 Feet Assistive device: Rolling walker (2 wheels) Gait Pattern/deviations: Decreased stride length, Decreased step length - right, Decreased step length - left Gait velocity: decreased     General Gait Details: slow slightly labored cadence with c/o minor discomfort right ankle, no  loss of balance, limited mostly due to fatigue  Stairs            Wheelchair Mobility    Modified Rankin (Stroke Patients Only)       Balance  Overall balance assessment: Needs assistance Sitting-balance support: Feet supported, No upper extremity supported Sitting balance-Leahy Scale: Good Sitting balance - Comments: seated at EOB   Standing balance support: During functional activity, Bilateral upper extremity supported Standing balance-Leahy Scale: Fair Standing balance comment: fair/good using RW                             Pertinent Vitals/Pain Pain Assessment Pain Assessment: Faces Faces Pain Scale: Hurts little more Pain Location: headache Pain Descriptors / Indicators: Headache Pain Intervention(s): Limited activity within patient's tolerance, Monitored during session, Patient requesting pain meds-RN notified    Home Living Family/patient expects to be discharged to:: Private residence Living Arrangements: Spouse/significant other Available Help at Discharge: Family;Available PRN/intermittently Type of Home: House Home Access: Level entry       Home Layout: One level Home Equipment: Conservation officer, nature (2 wheels);Rollator (4 wheels);Cane - single point      Prior Function Prior Level of Function : Needs assist       Physical Assist : Mobility (physical) Mobility (physical): Bed mobility;Transfers;Gait;Stairs   Mobility Comments: household and short distanced community ambulator using SPC most of time, sometimes uses RW/Rollator ADLs Comments: Assisted by family     Hand Dominance   Dominant Hand: Right    Extremity/Trunk Assessment   Upper Extremity Assessment Upper Extremity Assessment: Overall WFL for tasks assessed    Lower Extremity Assessment Lower Extremity Assessment: Generalized weakness    Cervical / Trunk Assessment Cervical / Trunk Assessment: Normal  Communication   Communication: No difficulties  Cognition Arousal/Alertness: Awake/alert Behavior During Therapy: WFL for tasks assessed/performed Overall Cognitive Status: Within Functional Limits for tasks assessed                                           General Comments      Exercises     Assessment/Plan    PT Assessment Patient needs continued PT services  PT Problem List Decreased strength;Decreased activity tolerance;Decreased balance;Decreased mobility       PT Treatment Interventions DME instruction;Gait training;Stair training;Functional mobility training;Therapeutic activities;Therapeutic exercise;Balance training;Patient/family education    PT Goals (Current goals can be found in the Care Plan section)  Acute Rehab PT Goals Patient Stated Goal: return home with family to assist PT Goal Formulation: With patient Time For Goal Achievement: 11/25/22 Potential to Achieve Goals: Good    Frequency Min 3X/week     Co-evaluation               AM-PAC PT "6 Clicks" Mobility  Outcome Measure Help needed turning from your back to your side while in a flat bed without using bedrails?: None Help needed moving from lying on your back to sitting on the side of a flat bed without using bedrails?: A Little Help needed moving to and from a bed to a chair (including a wheelchair)?: A Little Help needed standing up from a chair using your arms (e.g., wheelchair or bedside chair)?: A Little Help needed to walk in hospital room?: A Little Help needed climbing 3-5 steps with a railing? : A Lot 6 Click Score: 18  End of Session   Activity Tolerance: Patient tolerated treatment well;Patient limited by fatigue Patient left: in bed;with call bell/phone within reach Nurse Communication: Mobility status PT Visit Diagnosis: Unsteadiness on feet (R26.81);Other abnormalities of gait and mobility (R26.89);Muscle weakness (generalized) (M62.81)    Time: 9476-5465 PT Time Calculation (min) (ACUTE ONLY): 26 min   Charges:   PT Evaluation $PT Eval Moderate Complexity: 1 Mod PT Treatments $Therapeutic Activity: 23-37 mins        12:14 PM, 11/23/22 Lonell Grandchild,  MPT Physical Therapist with [facility] [phone] office [phone] mobile phone

## 2022-11-23 NOTE — ED Notes (Signed)
Pt assisted to use urinal at bedside via 2 man assist. Urine sample provided

## 2022-11-23 NOTE — ED Notes (Addendum)
Pt is resting. O2 sats 90%. Placed on 2L . O2 sat now at 94%  Asked pt about giving a urine sample. Pt unable to urinate at this time

## 2022-11-23 NOTE — Consult Note (Signed)
Cardiology Consultation   Patient ID: Dustin [name] MRN: [id number]; DOB: [birth year]  Admit date: 11/22/2022 Date of Consult: 11/23/2022  PCP:  [name] Cardiologist:  Rozann Lesches, MD        Patient Profile:   Dustin [name] is a 88 y.o. male with a hx of CAD (s/p CABG in 1996, DESx2 to SVG-PDA in 2012, DES to distal LM in 11/2019, cath in 02/2021 showing patent LM stent, patent LIMA-LAD, patent SVG-OM1 with chronically occluded jump limb to OM2 and chronic occlusion of SVG-D2 and SVG-PDA and no targets for intervention), HFmrEF (EF 45-50% in 02/2021, 50-55% in 07/2022), LBBB, HTN, HLD and dementia who is being seen 11/23/2022 for the evaluation of chest pain at the request of Dr. Roderic Palau.  History of Present Illness:   [name] was admitted to [facility] in 07/2022 for evaluation of chest pain which was concerning for angina at that time. Troponin values were flat (peaking at 35) and echocardiogram showed that his EF had improved to 50 to 55%. Medical management was recommended given his age, dementia and catheterization results from 2022. He was continued on his current medical therapy with Plavix 75 mg daily, Imdur 60 mg twice daily, Ranexa 500 mg twice daily, Spironolactone 12.5 mg daily and Crestor 40 mg daily. He was not on an SGLT2 inhibitor given frequent UTI's and was not on a beta-blocker due to baseline bradycardia.  He did follow-up with Melina Copa, PA in 08/2022 and reported chronic dizziness which had been occurring for the past year but was worse after starting Spironolactone. Spironolactone was discontinued and a 3-day Zio patch along with carotid dopplers were recommended for further evaluation.  Dopplers showed moderate plaque with no hemodynamically significant stenosis. His monitor showed predominantly normal sinus rhythm with an average heart rate of 58 bpm. He did have occasional PAC's representing 1.1% of total beats  and rare PVC's representing less than 1% of total beats.  Was noted to have 20 brief episodes of PSVT with the longest only lasting 7 beats and no significant arrhythmias or pauses.  He presented to [facility] ED during the evening hours of 11/22/2022 for evaluation of chest pain over the past 2 days. The patient was noted to be agitated and confused at the time of arrival.   Initial labs showed WBC 4.6, Hgb 10.6, platelets 178, Na+ 136, K+ 3.8 and creatinine 1.35 (baseline 0.9-1.0). Lactic acid 1.6. BNP 150. Initial and repeat Hs troponin values flat at 14 and 18. CXR showing right basilar atelectasis. CT Head showed no acute intracranial abnormalities. EKG showed normal sinus rhythm, heart rate 72 with known LBBB.  In talking with the patient today, no family is currently at the bedside but he reports having intermittent chest discomfort for the past few weeks. Occurs at rest and he is unaware of any precipitating factors. Says he can go for days to weeks without having chest discomfort but then will have chest discomfort several days in a row and will take up to 4 SL nitroglycerin with resolution of his pain. Also reports having an abdominal hernia and questions if this is contributing to his pain. Denies any specific orthopnea, PND or pitting edema.   Past Medical History:  Diagnosis Date   Alzheimer disease ([place])    Anxiety    Arthritis    Atrophic gastritis    a. By EGD 02/2013.   Carotid artery disease ([place])    a.  mild-mod plaque, <50% stenosis bilat by duplex 2018.   Coronary atherosclerosis of native coronary artery    a. Multivessel s/p CABG 1996. b. DESx2 to SVG-PDA in 2012 c. DES to distal LM in 11/2019 d. cath in 02/2021 showing patent LM stent, patent LIMA-LAD, patent SVG-OM1 with chronically occluded jump limb to OM2 and chronic occlusion of SVG-D2 and SVG-PDA and no targets for intervention   DDD (degenerative disc disease)    Chronic back pain   Dementia ([facility])    Enlarged  prostate    Essential hypertension    Hematuria    Hypothyroidism    LBBB (left bundle branch block)    MI (myocardial infarction) ([place])    Mixed hyperlipidemia    OA (osteoarthritis)    OSA (obstructive sleep apnea)    Pneumonia due to COVID-19 virus    February 2021   Sinus bradycardia    a. Aricept and BB discontinued due to this.    Past Surgical History:  Procedure Laterality Date   CATARACT EXTRACTION W/PHACO Left 03/22/2022   Procedure: CATARACT EXTRACTION PHACO AND INTRAOCULAR LENS PLACEMENT (IOC);  Surgeon: Baruch Goldmann, MD;  Location: [facility];  Service: Ophthalmology;  Laterality: Left;  CDE: 25.22   COLONOSCOPY  08/03/2004   Jenkins-numerous large diverticula in the descending, transverse, descending, and sigmoid colon. Otherwise normal exam.   COLONOSCOPY  07/12/2012   RMR: External hemorrhoidal tag; multiple rectal and colonic polyps removed and/or treated as described above. Pancolonic diverticulosis. Bx-tubular adenomas, rectal hyperplastic polyp. next colonoscopy in 06/2015.   COLONOSCOPY N/A 06/22/2016   Procedure: COLONOSCOPY;  Surgeon: Aviva Signs, MD;  Location: [facility];  Service: Gastroenterology;  Laterality: N/A;  41   CORONARY ANGIOPLASTY WITH STENT PLACEMENT     CORONARY ARTERY BYPASS GRAFT  1996   LIMA to LAD, SVG to D2, SVG to PDA, SVG to OM1 and OM2   CORONARY STENT INTERVENTION N/A 12/10/2019   Procedure: CORONARY STENT INTERVENTION;  Surgeon: Burnell Blanks, MD;  Location: [place] [facility];  Service: Cardiovascular;  Laterality: N/A;   ESOPHAGOGASTRODUODENOSCOPY N/A 03/16/2013   Procedure: ESOPHAGOGASTRODUODENOSCOPY (EGD);  Surgeon: Daneil Dolin, MD;  Location: [facility];  Service: Endoscopy;  Laterality: N/A;  12:00-moved to [place] notified pt   ESOPHAGOGASTRODUODENOSCOPY N/A 03/06/2013   Procedure: ESOPHAGOGASTRODUODENOSCOPY (EGD);  Surgeon: Daneil Dolin, MD;  Location: [facility];  Service: Endoscopy;  Laterality: N/A;    HERNIA REPAIR     INTRAVASCULAR ULTRASOUND/IVUS N/A 12/10/2019   Procedure: Intravascular Ultrasound/IVUS;  Surgeon: Burnell Blanks, MD;  Location: [place] [facility];  Service: Cardiovascular;  Laterality: N/A;   LEFT HEART CATH AND CORS/GRAFTS ANGIOGRAPHY N/A 12/10/2019   Procedure: LEFT HEART CATH AND CORS/GRAFTS ANGIOGRAPHY;  Surgeon: Burnell Blanks, MD;  Location: [place] [facility];  Service: Cardiovascular;  Laterality: N/A;   LEFT HEART CATH AND CORS/GRAFTS ANGIOGRAPHY N/A 03/02/2021   Procedure: LEFT HEART CATH AND CORS/GRAFTS ANGIOGRAPHY;  Surgeon: Nelva Bush, MD;  Location: [place] [facility];  Service: Cardiovascular;  Laterality: N/A;     Home Medications:  Prior to Admission medications   Medication Sig Start Date End Date Taking? Authorizing Provider  albuterol (PROVENTIL) (2.5 MG/3ML) 0.083% nebulizer solution INHALE 1 VIAL VIA NEBULIZER EVERY 6 HOURS AS NEEDED FOR WHEEZING OR SHORT OF BREATH. 06/14/22  Yes Chesley Mires, MD  albuterol (VENTOLIN HFA) 108 (90 Base) MCG/ACT inhaler Inhale 2 puffs into the lungs every 6 (six) hours as needed for wheezing  or shortness of breath. 12/31/21  Yes Chesley Mires, MD  alfuzosin (UROXATRAL) 10 MG 24 hr tablet Take 1 tablet (10 mg total) by mouth daily with breakfast. 03/10/22  Yes McKenzie, Candee Furbish, MD  clopidogrel (PLAVIX) 75 MG tablet Take 75 mg by mouth daily.   Yes [provider]  dutasteride (AVODART) 0.5 MG capsule Take 1 capsule (0.5 mg total) by mouth daily. In the afternoon 03/10/22  Yes McKenzie, Candee Furbish, MD  escitalopram (LEXAPRO) 20 MG tablet Take 20 mg by mouth every morning.    Yes [provider]  furosemide (LASIX) 20 MG tablet Take 20 mg by mouth 2 (two) times daily. 07/14/22  Yes [provider]  isosorbide mononitrate (IMDUR) 120 MG 24 hr tablet Take 120 mg by mouth every morning. 11/03/22  Yes [provider]  levothyroxine (SYNTHROID) 75 MCG tablet Take 75 mcg by  mouth daily. 02/23/22  Yes [provider]  memantine (NAMENDA) 10 MG tablet Take 10 mg by mouth 2 (two) times daily.  03/05/16  Yes [provider]  nitroGLYCERIN (NITROSTAT) 0.4 MG SL tablet Place 1 tablet (0.4 mg total) under the tongue every 5 (five) minutes as needed for chest pain. Pt has bought nitro bottle from home and oxycodone 30 mg ( 3 or 4 tabs) is in nitro bottle per wife Trygg Mantz. Patient taking differently: Place 0.4 mg under the tongue every 5 (five) minutes as needed for chest pain. 11/04/20  Yes Satira Sark, MD  oxycodone (ROXICODONE) 30 MG immediate release tablet Take 30 mg by mouth every 6 (six) hours as needed for pain. 09/02/21  Yes [provider]  potassium chloride SA (K-DUR,KLOR-CON) 20 MEQ tablet Take 20 mEq by mouth daily.    Yes [provider]  QUEtiapine (SEROQUEL) 25 MG tablet Take 25 mg by mouth at bedtime as needed (sleep). 11/19/19  Yes [provider]  ranolazine (RANEXA) 500 MG 12 hr tablet Take 1 tablet (500 mg total) by mouth 2 (two) times daily. 08/11/22 08/06/23 Yes Shahmehdi, Valeria Batman, MD  rosuvastatin (CRESTOR) 40 MG tablet Take 1 tablet (40 mg total) by mouth daily at 6 PM. 12/11/19  Yes Hollice Gong, Mir Mohammed, MD  Budeson-Glycopyrrol-Formoterol (BREZTRI AEROSPHERE) 160-9-4.8 MCG/ACT AERO Inhale 2 puffs into the lungs in the morning and at bedtime. Patient not taking: Reported on 11/23/2022 12/31/21   Chesley Mires, MD    Inpatient Medications: Scheduled Meds:  [START ON 11/24/2022] alfuzosin  10 mg Oral Q breakfast   Budeson-Glycopyrrol-Formoterol  2 puff Inhalation BID   clopidogrel  75 mg Oral Daily   dutasteride  0.5 mg Oral Daily   enoxaparin (LOVENOX) injection  40 mg Subcutaneous Q24H   escitalopram  20 mg Oral q morning   guaiFENesin  600 mg Oral BID   ipratropium-albuterol  3 mL Nebulization Q6H   isosorbide mononitrate  120 mg Oral q morning   levothyroxine  75 mcg Oral Daily   memantine   10 mg Oral BID   potassium chloride SA  20 mEq Oral Daily   ranolazine  500 mg Oral BID   rosuvastatin  40 mg Oral q1800   Continuous Infusions:  sodium chloride     PRN Meds: acetaminophen **OR** acetaminophen, albuterol, nitroGLYCERIN, ondansetron **OR** ondansetron (ZOFRAN) IV, oxycodone, QUEtiapine  Allergies:   No Known Allergies  Social History:   Social History   Socioeconomic History   Marital status: Married    Spouse name: Not on file   Number of  children: 3   Years of education: Not on file   Highest education level: Not on file  Occupational History   Occupation: retired    Fish farm manager: RETIRED  Tobacco Use   Smoking status: Former    Packs/day: 2.00    Years: 40.00    Total pack years: 80.00    Types: Cigarettes    Quit date: 12/27/1994    Years since quitting: 27.9   Smokeless tobacco: Never  Vaping Use   Vaping Use: Never used  Substance and Sexual Activity   Alcohol use: No    Alcohol/week: 0.0 standard drinks of alcohol   Drug use: No   Sexual activity: Not Currently  Other Topics Concern   Not on file  Social History Narrative   Lives w/ ailing wife.   Son brings meals 3x per week   Social Determinants of Health   Financial Resource Strain: Not on file  Food Insecurity: Not on file  Transportation Needs: Not on file  Physical Activity: Not on file  Stress: Not on file  Social Connections: Not on file  Intimate Partner Violence: Not on file    Family History:    Family History  Problem Relation Age of Onset   Heart disease Other    Heart attack Mother    Colon cancer Neg Hx      ROS:  Please see the history of present illness.   All other ROS reviewed and negative.     Physical Exam/Data:   Vitals:   11/23/22 0440 11/23/22 0500 11/23/22 0530 11/23/22 0724  BP: (!) 138/57 (!) 139/54    Pulse: 67 68 72   Resp: 14 (!) 23 20   Temp:    97.9 F (36.6 C)  TempSrc:    Oral  SpO2: 95% 94% 93%   Weight:      Height:       No  intake or output data in the 24 hours ending 11/23/22 0944    11/22/2022   11:42 PM 09/02/2022    2:29 PM 08/10/2022    4:55 AM  Last 3 Weights  Weight (lbs) 207 lb 3.7 oz 207 lb 212 lb 15.4 oz  Weight (kg) 94 kg 93.895 kg 96.6 kg     Body mass index is 29.73 kg/m.  General:  Well nourished, well developed male appearing in no acute distress.  HEENT: normal Neck: no JVD Vascular: No carotid bruits; Distal pulses 2+ bilaterally Cardiac:  normal S1, S2; RRR; no murmur Lungs:  clear to auscultation bilaterally, no wheezing, rhonchi or rales  Abd: soft, nontender, appears distended.  Ext: no pitting edema Musculoskeletal:  No deformities, BUE and BLE strength normal and equal Skin: warm and dry  Neuro:  CNs 2-12 intact, no focal abnormalities noted Psych: A&Ox2 currently (person, place).    EKG:  The EKG was personally reviewed and demonstrates: NSR, HR 72 with known LBBB.  Telemetry:  Telemetry was personally reviewed and demonstrates: NSR, HR in 60's to 70's.   Relevant CV Studies:  LHC: 02/2021 Conclusions: Severe native coronary artery disease. Including 99% mid LAD stenosis with competitive flow from LIMA-LAD, as well as chronic total occlusions of proximal LCx and mid RCA. Widely patent LMCA stent. Widely patent LIMA-LAD. Patent SVG-OM1 with minimal liminal irregularities.  Jump limb to OM2 is chronically occluded. Chronically occlused SVG-D2 and SVG-rPDA. Mildly reduced left ventricular contraction with global hypokinesis (LVEF 45-50%). Moderately elevated left ventricular filling pressure (LVEDP 25-30 mmHg).   Recommendations: Escalate furosemide  to 40 mg daily. Consider further escalation of antianginal therapy.  No interventional targets identified on today's catheterization. Consider echo to reassess LVEF and exclude other structural abnormalities that could be contributing to symptoms. Continue aggressive secondary prevention of coronary artery  disease.  Echocardiogram: 07/2022 IMPRESSIONS     1. Left ventricular ejection fraction, by estimation, is 50 to 55%. The  left ventricle has low normal function. The left ventricle demonstrates  regional wall motion abnormalities (see scoring diagram/findings for  description). The left ventricular  internal cavity size was mildly dilated. There is moderate left  ventricular hypertrophy. Left ventricular diastolic parameters are  consistent with Grade II diastolic dysfunction (pseudonormalization).   2. Right ventricular systolic function is low normal. The right  ventricular size is normal. There is mildly elevated pulmonary artery  systolic pressure. The estimated right ventricular systolic pressure is  38.1 mmHg.   3. Left atrial size was moderately dilated.   4. The mitral valve is degenerative. Mild mitral valve regurgitation. The  mean mitral valve gradient is 2.0 mmHg.   5. The aortic valve is tricuspid. Aortic valve regurgitation is not  visualized. Aortic valve sclerosis/calcification is present, without any  evidence of aortic stenosis. Aortic valve mean gradient measures 7.5 mmHg.   6. The inferior vena cava is normal in size with <50% respiratory  variability, suggesting right atrial pressure of 8 mmHg.   Comparison(s): Prior images reviewed side by side. LVEF low normal range  at 50-55% with basal inferoposterior hypokinesis. Mildly increased RVSP.   Carotid Dopplers: 08/2022 IMPRESSION: Color duplex indicates moderate heterogeneous and calcified plaque, with no hemodynamically significant stenosis by duplex criteria in the extracranial cerebrovascular circulation.  Event Monitor: 08/2022 Predominant rhythm is sinus with heart rate ranging from 46 bpm up to 77 bpm and average heart rate 58 bpm. There were occasional PACs representing 1.1% total beats with otherwise rare atrial couplets and triplets. There were rare PVCs including ventricular couplets representing  less than 1% total beats. Twenty very brief episodes of PSVT were noted, the longest of which lasted only 7 beats.  There were no sustained atrial arrhythmias. No pauses.   Laboratory Data:  High Sensitivity Troponin:   Recent Labs  Lab 11/23/22 0008 11/23/22 0440  TROPONINIHS 14 18*     Chemistry Recent Labs  Lab 11/23/22 0008  NA 136  K 3.8  CL 102  CO2 26  GLUCOSE 131*  BUN 26*  CREATININE 1.35*  CALCIUM 8.6*  GFRNONAA 51*  ANIONGAP 8    Recent Labs  Lab 11/23/22 0008  PROT 6.7  ALBUMIN 3.5  AST 22  ALT 15  ALKPHOS 68  BILITOT 0.9   Lipids No results for input(s): "CHOL", "TRIG", "HDL", "LABVLDL", "LDLCALC", "CHOLHDL" in the last 168 hours.  Hematology Recent Labs  Lab 11/23/22 0008  WBC 4.6  RBC 3.40*  HGB 10.6*  HCT 32.4*  MCV 95.3  MCH 31.2  MCHC 32.7  RDW 14.4  PLT 178   Thyroid No results for input(s): "TSH", "FREET4" in the last 168 hours.  BNP Recent Labs  Lab 11/23/22 0008  BNP 150.0*    DDimer No results for input(s): "DDIMER" in the last 168 hours.   Radiology/Studies:  CT HEAD WO CONTRAST (5MM)  Result Date: 11/23/2022 CLINICAL DATA:  Altered mental status EXAM: CT HEAD WITHOUT CONTRAST TECHNIQUE: Contiguous axial images were obtained from the base of the skull through the vertex without intravenous contrast. RADIATION DOSE REDUCTION: This exam was performed according to  the departmental dose-optimization program which includes automated exposure control, adjustment of the mA and/or kV according to patient size and/or use of iterative reconstruction technique. COMPARISON:  None Available. FINDINGS: Brain: There is no mass, hemorrhage or extra-axial collection. The size and configuration of the ventricles and extra-axial CSF spaces are normal. The brain parenchyma is normal, without acute or chronic infarction. Vascular: Atherosclerotic calcification of the internal carotid arteries at the skull base. No abnormal hyperdensity of the  major intracranial arteries or dural venous sinuses. Skull: The visualized skull base, calvarium and extracranial soft tissues are normal. Sinuses/Orbits: No fluid levels or advanced mucosal thickening of the visualized paranasal sinuses. No mastoid or middle ear effusion. The orbits are normal. IMPRESSION: No acute intracranial abnormality. Electronically Signed   By: Ulyses Jarred M.D.   On: 11/23/2022 00:48   DG Chest Port 1 View  Result Date: 11/23/2022 CLINICAL DATA:  Cough and shortness of breath EXAM: PORTABLE CHEST 1 VIEW COMPARISON:  08/09/2022 FINDINGS: Cardiac shadow is enlarged. Postsurgical changes are again noted. The lungs are well aerated without focal infiltrate or effusion. Minimal right basilar atelectasis is seen. No bony abnormality is noted. IMPRESSION: Mild right basilar atelectasis. Electronically Signed   By: Inez Catalina M.D.   On: 11/23/2022 00:38     Assessment and Plan:   1. Chest Pain with Mixed Features - His chest discomfort occurs at rest but does radiate to his shoulder which he reports resembles his prior angina. Troponin values have been flat at 14 and 18 with repeat EKG showing his known LBBB. Medical management was recommended during his prior admission due to his dementia and prior cardiac catheterization results. - He is currently on Plavix 75 mg daily, Imdur 60 mg twice daily, Ranexa 500 mg twice daily and Crestor 40 mg daily. Can consider titration of Ranexa to '1000mg'$  BID prior to discharge for additional antianginal benefit. Do not see a strong indication to repeat an echocardiogram at this time given his recent study.   2. CAD - He is s/p CABG in 1996 with DESx2 to SVG-PDA in 2012, DES to distal LM in 11/2019 and cath in 02/2021 showed patent LM stent, patent LIMA-LAD, patent SVG-OM1 with chronically occluded jump limb to OM2 and chronic occlusion of SVG-D2 and SVG-PDA and no targets for intervention. - Anticipate medical management as outlined above.  -  Continue Plavix 75 mg daily, Imdur 120 mg daily, Ranexa 500 mg twice daily and Crestor 40 mg daily.  3. HFimpEF - His EF was at 45-50% in 02/2021, improved to 50-55% by echo in 07/2022.  BNP only mildly elevated at 150 on admission and he does not appear volume overloaded by examination today. He has been continued on Imdur 120 mg daily. Not on beta-blocker therapy due to baseline bradycardia and previously intolerant to Spironolactone secondary to dizziness. As documented during prior hospitalizations, would not start an SGLT2 inhibitor given his frequent UTI's.  4. Baseline Bradycardia - Recent outpatient monitor showed occasional PAC's and PVC's representing less than 1% of total beats and 20 brief episodes of PSVT with the longest lasting for 7 beats. His average heart rate was in the 50's, therefore continue to avoid AV nodal blocking agents.  5. HLD - FLP in 07/2022 showed total cholesterol 92, triglycerides 130, HDL 33 and LDL 33. Continue Crestor 40 mg daily.     For questions or updates, please contact [facility] Please consult [email or web address] for contact info under    Signed, Fransisco Hertz  Ahmed Prima, PA-C  11/23/2022 9:44 AM

## 2022-11-23 NOTE — Progress Notes (Signed)
*  PRELIMINARY RESULTS* Echocardiogram Limited 2-D Echocardiogram  has been performed.  Samuel Germany 11/23/2022, 5:16 PM

## 2022-11-23 NOTE — Plan of Care (Signed)
  Problem: Acute Rehab PT Goals(only PT should resolve) Goal: Pt Will Go Supine/Side To Sit Outcome: Progressing Flowsheets (Taken 11/23/2022 1215) Pt will go Supine/Side to Sit:  with supervision  with modified independence Goal: Patient Will Transfer Sit To/From Stand Outcome: Progressing Flowsheets (Taken 11/23/2022 1215) Patient will transfer sit to/from stand:  with supervision  with modified independence Goal: Pt Will Transfer Bed To Chair/Chair To Bed Outcome: Progressing Flowsheets (Taken 11/23/2022 1215) Pt will Transfer Bed to Chair/Chair to Bed:  with supervision  with modified independence Goal: Pt Will Ambulate Outcome: Progressing Flowsheets (Taken 11/23/2022 1215) Pt will Ambulate:  75 feet  with supervision  with modified independence  with rolling walker   12:16 PM, 11/23/22 Lonell Grandchild, MPT Physical Therapist with [facility] [phone] office [phone] mobile phone

## 2022-11-23 NOTE — H&P (Signed)
History and Physical    [name] OMV:[id number] DOB: 1934/05/27 DOA: 11/22/2022  PCP: [name], MD  Patient coming from: home  I have personally briefly reviewed patient's old medical records in [place]  Chief Complaint: chest pain, confusion  HPI: Dustin [name] is a 88 y.o. male with medical history significant of coronary artery disease, hypertension, dementia, chronic pain, BPH, who was brought to the hospital by family due to concerns of confusion and persistent chest pain.  It was reported that patient has been having substernal to left-sided chest pain with radiation to left shoulder.  This has been intermittently occurring for the past few days.  She has been using his nitroglycerin more frequently.  His son reports that when he has increased pain, he tends to get more confused.  Reports his oral intake has been normal for him.  Bowel movements have been normal for him.  He has not had any fever or dysuria.  He does describe a chronic cough and is producing tan-colored sputum.  He says he does feel short of breath.  He says he often gets choked up when trying to eat or drink.  ED Course: EKG shows sinus with left bundle branch block.  Initial troponin was normal, follow-up mildly elevated at 18.  He did have elevated BUN and creatinine from his baseline.  CT head noted to be unremarkable.  Review of Systems: As per HPI otherwise 10 point review of systems negative.    Past Medical History:  Diagnosis Date   Alzheimer disease ([place])    Anxiety    Arthritis    Atrophic gastritis    a. By EGD 02/2013.   Carotid artery disease (HCC)    a. mild-mod plaque, <50% stenosis bilat by duplex 2018.   Coronary atherosclerosis of native coronary artery    a. Multivessel s/p CABG 1996. b. DESx2 to SVG-PDA in 2012 c. DES to distal LM in 11/2019 d. cath in 02/2021 showing patent LM stent, patent LIMA-LAD, patent SVG-OM1 with chronically occluded jump limb to OM2 and chronic  occlusion of SVG-D2 and SVG-PDA and no targets for intervention   DDD (degenerative disc disease)    Chronic back pain   Dementia (HCC)    Enlarged prostate    Essential hypertension    Hematuria    Hypothyroidism    LBBB (left bundle branch block)    MI (myocardial infarction) ([place])    Mixed hyperlipidemia    OA (osteoarthritis)    OSA (obstructive sleep apnea)    Pneumonia due to COVID-19 virus    February 2021   Sinus bradycardia    a. Aricept and BB discontinued due to this.    Past Surgical History:  Procedure Laterality Date   CATARACT EXTRACTION W/PHACO Left 03/22/2022   Procedure: CATARACT EXTRACTION PHACO AND INTRAOCULAR LENS PLACEMENT (IOC);  Surgeon: Baruch Goldmann, MD;  Location: [facility];  Service: Ophthalmology;  Laterality: Left;  CDE: 25.22   COLONOSCOPY  08/03/2004   [place]kins-numerous large diverticula in the descending, transverse, descending, and sigmoid colon. Otherwise normal exam.   COLONOSCOPY  07/12/2012   RMR: External hemorrhoidal tag; multiple rectal and colonic polyps removed and/or treated as described above. Pancolonic diverticulosis. Bx-tubular adenomas, rectal hyperplastic polyp. next colonoscopy in 06/2015.   COLONOSCOPY N/A 06/22/2016   Procedure: COLONOSCOPY;  Surgeon: Aviva Signs, MD;  Location: [facility];  Service: Gastroenterology;  Laterality: N/A;  730   CORONARY ANGIOPLASTY WITH STENT PLACEMENT     CORONARY  ARTERY BYPASS GRAFT  1996   LIMA to LAD, SVG to D2, SVG to PDA, SVG to OM1 and OM2   CORONARY STENT INTERVENTION N/A 12/10/2019   Procedure: CORONARY STENT INTERVENTION;  Surgeon: Burnell Blanks, MD;  Location: [place] [facility];  Service: Cardiovascular;  Laterality: N/A;   ESOPHAGOGASTRODUODENOSCOPY N/A 03/16/2013   Procedure: ESOPHAGOGASTRODUODENOSCOPY (EGD);  Surgeon: Daneil Dolin, MD;  Location: [facility];  Service: Endoscopy;  Laterality: N/A;  12:00-moved to [place] notified pt   ESOPHAGOGASTRODUODENOSCOPY N/A  03/06/2013   Procedure: ESOPHAGOGASTRODUODENOSCOPY (EGD);  Surgeon: Daneil Dolin, MD;  [facility]: [facility];  Service: Endoscopy;  Laterality: N/A;   HERNIA REPAIR     INTRAVASCULAR ULTRASOUND/IVUS N/A 12/10/2019   Procedure: Intravascular Ultrasound/IVUS;  Surgeon: Burnell Blanks, MD;  Location: [place] [facility];  Service: Cardiovascular;  Laterality: N/A;   LEFT HEART CATH AND CORS/GRAFTS ANGIOGRAPHY N/A 12/10/2019   Procedure: LEFT HEART CATH AND CORS/GRAFTS ANGIOGRAPHY;  Surgeon: Burnell Blanks, MD;  Location: [place] [facility];  Service: Cardiovascular;  Laterality: N/A;   LEFT HEART CATH AND CORS/GRAFTS ANGIOGRAPHY N/A 03/02/2021   Procedure: LEFT HEART CATH AND CORS/GRAFTS ANGIOGRAPHY;  Surgeon: Nelva Bush, MD;  Location: [place] [facility];  Service: Cardiovascular;  Laterality: N/A;    Social History:  reports that he quit smoking about 27 years ago. His smoking use included cigarettes. He has a 80.00 pack-year smoking history. He has never used smokeless tobacco. He reports that he does not drink alcohol and does not use drugs.  No Known Allergies  Family History  Problem Relation Age of Onset   Heart disease Other    Heart attack Mother    Colon cancer Neg Hx      Prior to Admission medications   Medication Sig Start Date End Date Taking? Authorizing Provider  albuterol (PROVENTIL) (2.5 MG/3ML) 0.083% nebulizer solution INHALE 1 VIAL VIA NEBULIZER EVERY 6 HOURS AS NEEDED FOR WHEEZING OR SHORT OF BREATH. 06/14/22  Yes Chesley Mires, MD  albuterol (VENTOLIN HFA) 108 (90 Base) MCG/ACT inhaler Inhale 2 puffs into the lungs every 6 (six) hours as needed for wheezing or shortness of breath. 12/31/21  Yes Chesley Mires, MD  alfuzosin (UROXATRAL) 10 MG 24 hr tablet Take 1 tablet (10 mg total) by mouth daily with breakfast. 03/10/22  Yes McKenzie, Candee Furbish, MD  clopidogrel (PLAVIX) 75 MG tablet Take 75 mg by mouth daily.   Yes [provider]   dutasteride (AVODART) 0.5 MG capsule Take 1 capsule (0.5 mg total) by mouth daily. In the afternoon 03/10/22  Yes McKenzie, Candee Furbish, MD  escitalopram (LEXAPRO) 20 MG tablet Take 20 mg by mouth every morning.    Yes [provider]  furosemide (LASIX) 20 MG tablet Take 20 mg by mouth 2 (two) times daily. 07/14/22  Yes [provider]  isosorbide mononitrate (IMDUR) 120 MG 24 hr tablet Take 120 mg by mouth every morning. 11/03/22  Yes [provider]  levothyroxine (SYNTHROID) 75 MCG tablet Take 75 mcg by mouth daily. 02/23/22  Yes [provider]  memantine (NAMENDA) 10 MG tablet Take 10 mg by mouth 2 (two) times daily.  03/05/16  Yes [provider]  nitroGLYCERIN (NITROSTAT) 0.4 MG SL tablet Place 1 tablet (0.4 mg total) under the tongue every 5 (five) minutes as needed for chest pain. Pt has bought nitro bottle from home and oxycodone 30 mg ( 3 or 4 tabs) is in nitro  bottle per wife Graeme Menees. Patient taking differently: Place 0.4 mg under the tongue every 5 (five) minutes as needed for chest pain. 11/04/20  Yes Satira Sark, MD  oxycodone (ROXICODONE) 30 MG immediate release tablet Take 30 mg by mouth every 6 (six) hours as needed for pain. 09/02/21  Yes [provider]  potassium chloride SA (K-DUR,KLOR-CON) 20 MEQ tablet Take 20 mEq by mouth daily.    Yes [provider]  QUEtiapine (SEROQUEL) 25 MG tablet Take 25 mg by mouth at bedtime as needed (sleep). 11/19/19  Yes [provider]  ranolazine (RANEXA) 500 MG 12 hr tablet Take 1 tablet (500 mg total) by mouth 2 (two) times daily. 08/11/22 08/06/23 Yes Shahmehdi, Valeria Batman, MD  rosuvastatin (CRESTOR) 40 MG tablet Take 1 tablet (40 mg total) by mouth daily at 6 PM. 12/11/19  Yes Hollice Gong, Mir Mohammed, MD  Budeson-Glycopyrrol-Formoterol (BREZTRI AEROSPHERE) 160-9-4.8 MCG/ACT AERO Inhale 2 puffs into the lungs in the morning and at bedtime. Patient not taking: Reported on  11/23/2022 12/31/21   Chesley Mires, MD    Physical Exam: Vitals:   11/23/22 0440 11/23/22 0500 11/23/22 0530 11/23/22 0724  BP: (!) 138/57 (!) 139/54    Pulse: 67 68 72   Resp: 14 (!) 23 20   Temp:    97.9 F (36.6 C)  TempSrc:    Oral  SpO2: 95% 94% 93%   Weight:      Height:        Constitutional: NAD, calm, comfortable Eyes: PERRL, lids and conjunctivae normal ENMT: Mucous membranes are moist. Posterior pharynx clear of any exudate or lesions.Normal dentition.  Neck: normal, supple, no masses, no thyromegaly Respiratory: clear to auscultation bilaterally, no wheezing, no crackles. Normal respiratory effort. No accessory muscle use.  Cardiovascular: Regular rate and rhythm, no murmurs / rubs / gallops. No extremity edema. 2+ pedal pulses. No carotid bruits.  Abdomen: no tenderness, no masses palpated.  Abdomen appears to be distended.  He does have a ventral hernia that is nontender to palpation.  No hepatosplenomegaly. Bowel sounds positive.  Musculoskeletal: no clubbing / cyanosis. No joint deformity upper and lower extremities. Good ROM, no contractures. Normal muscle tone.  Skin: no rashes, lesions, ulcers. No induration Neurologic: CN 2-12 grossly intact. Sensation intact, DTR normal. Strength 5/5 in all 4.  Psychiatric: Normal judgment and insight. Alert and oriented x 3. Normal mood.    Labs on Admission: I have personally reviewed following labs and imaging studies  CBC: Recent Labs  Lab 11/23/22 0008 11/23/22 0959  WBC 4.6 5.3  NEUTROABS 1.8  --   HGB 10.6* 10.8*  HCT 32.4* 33.4*  MCV 95.3 95.4  PLT 178 621   Basic Metabolic Panel: Recent Labs  Lab 11/23/22 0008  NA 136  K 3.8  CL 102  CO2 26  GLUCOSE 131*  BUN 26*  CREATININE 1.35*  CALCIUM 8.6*   GFR: Estimated Creatinine Clearance: 45.2 mL/min (A) (by C-G formula based on SCr of 1.35 mg/dL (H)). Liver Function Tests: Recent Labs  Lab 11/23/22 0008  AST 22  ALT 15  ALKPHOS 68  BILITOT 0.9   PROT 6.7  ALBUMIN 3.5   No results for input(s): "LIPASE", "AMYLASE" in the last 168 hours. No results for input(s): "AMMONIA" in the last 168 hours. Coagulation Profile: No results for input(s): "INR", "PROTIME" in the last 168 hours. Cardiac Enzymes: No results for input(s): "CKTOTAL", "CKMB", "CKMBINDEX", "TROPONINI" in the last 168 hours. BNP (last 3 results)  No results for input(s): "PROBNP" in the last 8760 hours. HbA1C: No results for input(s): "HGBA1C" in the last 72 hours. CBG: No results for input(s): "GLUCAP" in the last 168 hours. Lipid Profile: No results for input(s): "CHOL", "HDL", "LDLCALC", "TRIG", "CHOLHDL", "LDLDIRECT" in the last 72 hours. Thyroid Function Tests: No results for input(s): "TSH", "T4TOTAL", "FREET4", "T3FREE", "THYROIDAB" in the last 72 hours. Anemia Panel: No results for input(s): "VITAMINB12", "FOLATE", "FERRITIN", "TIBC", "IRON", "RETICCTPCT" in the last 72 hours. Urine analysis:    Component Value Date/Time   COLORURINE YELLOW 11/23/2022 0334   APPEARANCEUR CLEAR 11/23/2022 0334   APPEARANCEUR Clear 03/10/2022 1355   LABSPEC 1.019 11/23/2022 0334   PHURINE 5.0 11/23/2022 0334   GLUCOSEU NEGATIVE 11/23/2022 0334   HGBUR NEGATIVE 11/23/2022 0334   BILIRUBINUR NEGATIVE 11/23/2022 0334   BILIRUBINUR Negative 03/10/2022 1355   KETONESUR NEGATIVE 11/23/2022 0334   PROTEINUR 100 (A) 11/23/2022 0334   UROBILINOGEN 0.2 01/26/2015 2247   NITRITE NEGATIVE 11/23/2022 0334   LEUKOCYTESUR NEGATIVE 11/23/2022 0334    Radiological Exams on Admission: CT HEAD WO CONTRAST (5MM)  Result Date: 11/23/2022 CLINICAL DATA:  Altered mental status EXAM: CT HEAD WITHOUT CONTRAST TECHNIQUE: Contiguous axial images were obtained from the base of the skull through the vertex without intravenous contrast. RADIATION DOSE REDUCTION: This exam was performed according to the departmental dose-optimization program which includes automated exposure control, adjustment  of the mA and/or kV according to patient size and/or use of iterative reconstruction technique. COMPARISON:  None Available. FINDINGS: Brain: There is no mass, hemorrhage or extra-axial collection. The size and configuration of the ventricles and extra-axial CSF spaces are normal. The brain parenchyma is normal, without acute or chronic infarction. Vascular: Atherosclerotic calcification of the internal carotid arteries at the skull base. No abnormal hyperdensity of the major intracranial arteries or dural venous sinuses. Skull: The visualized skull base, calvarium and extracranial soft tissues are normal. Sinuses/Orbits: No fluid levels or advanced mucosal thickening of the visualized paranasal sinuses. No mastoid or middle ear effusion. The orbits are normal. IMPRESSION: No acute intracranial abnormality. Electronically Signed   By: Ulyses Jarred M.D.   On: 11/23/2022 00:48   DG Chest Port 1 View  Result Date: 11/23/2022 CLINICAL DATA:  Cough and shortness of breath EXAM: PORTABLE CHEST 1 VIEW COMPARISON:  08/09/2022 FINDINGS: Cardiac shadow is enlarged. Postsurgical changes are again noted. The lungs are well aerated without focal infiltrate or effusion. Minimal right basilar atelectasis is seen. No bony abnormality is noted. IMPRESSION: Mild right basilar atelectasis. Electronically Signed   By: Inez Catalina M.D.   On: 11/23/2022 00:38    EKG: Independently reviewed.  Sinus rhythm with left bundle branch block.  LBBB appears to have been intermittently present on previous tracings  Assessment/Plan Principal Problem:   Atypical chest pain Active Problems:   Hyperlipidemia   Alzheimer disease (HCC)   HYPERTENSION, BENIGN   Coronary artery disease   Hypothyroidism   Chronic diastolic CHF (congestive heart failure) (HCC)   COPD (chronic obstructive pulmonary disease) (HCC)   Abdominal distention     Chest pain -Patient with known history of coronary disease -Describes chest pain in the  substernal/left chest rating to his left shoulder -Reports that he has been frequently taking nitroglycerin -Cardiac enzymes have been relatively flat -He is on multiple cardiac meds including Imdur, Ranexa -Will continue home medications -Cardiology consulted  Altered mental status superimposed on baseline dementia -Overall confusion does appear to be better at this point -He is  on chronic opiates which may be contributing -No signs of infection at this time -By labs, he does appear to be mildly dehydrated which could also be contributing to changes in mental status -CT head negative -Continue to monitor  COPD -Did describe shortness of breath and cough -Continue bronchodilators, resume home inhalers -Mucinex, flutter valve -Chest x-ray does not show any pneumonia, does show atelectasis -Since he does report increased cough with p.o. intake, will request SLP evaluation  Chronic diastolic heart failure -Appears compensated -Will hold further home dose of Lasix since he appears to be mildly dry  Hyperlipidemia -Continue statin  Hypothyroidism -Continue Synthroid  Dementia -Continue Namenda  Abdominal distention -His belly does appear to be quite distended, he feels that it is more than normal -Check CT abdomen     DVT prophylaxis: Lovenox Code Status: Full code Family Communication: Updated patient's son Cashis Rill over the phone Disposition Plan: Discharge home likely in a.m. Consults called: Cardiology Admission status: Telemetry, observation  Kathie Dike MD [name]   If 7PM-7AM, please contact night-coverage [email or web address]   11/23/2022, 10:12 AM

## 2022-11-24 ENCOUNTER — Observation Stay (HOSPITAL_COMMUNITY): Payer: Medicare HMO

## 2022-11-24 DIAGNOSIS — I25709 Atherosclerosis of coronary artery bypass graft(s), unspecified, with unspecified angina pectoris: Secondary | ICD-10-CM | POA: Diagnosis not present

## 2022-11-24 DIAGNOSIS — R0789 Other chest pain: Secondary | ICD-10-CM | POA: Diagnosis not present

## 2022-11-24 DIAGNOSIS — I200 Unstable angina: Secondary | ICD-10-CM

## 2022-11-24 DIAGNOSIS — I5033 Acute on chronic diastolic (congestive) heart failure: Secondary | ICD-10-CM | POA: Diagnosis not present

## 2022-11-24 DIAGNOSIS — J9811 Atelectasis: Secondary | ICD-10-CM | POA: Diagnosis not present

## 2022-11-24 DIAGNOSIS — R0602 Shortness of breath: Secondary | ICD-10-CM | POA: Diagnosis not present

## 2022-11-24 LAB — COMPREHENSIVE METABOLIC PANEL
ALT: 13 U/L (ref 0–44)
AST: 16 U/L (ref 15–41)
Albumin: 3 g/dL — ABNORMAL LOW (ref 3.5–5.0)
Alkaline Phosphatase: 48 U/L (ref 38–126)
Anion gap: 6 (ref 5–15)
BUN: 20 mg/dL (ref 8–23)
CO2: 28 mmol/L (ref 22–32)
Calcium: 8.4 mg/dL — ABNORMAL LOW (ref 8.9–10.3)
Chloride: 104 mmol/L (ref 98–111)
Creatinine, Ser: 1.13 mg/dL (ref 0.61–1.24)
GFR, Estimated: >60 mL/min (ref >60)
Glucose, Bld: 112 mg/dL — ABNORMAL HIGH (ref 70–99)
Potassium: 3.5 mmol/L (ref 3.5–5.1)
Sodium: 138 mmol/L (ref 135–145)
Total Bilirubin: 0.7 mg/dL (ref 0.3–1.2)
Total Protein: 6.1 g/dL — ABNORMAL LOW (ref 6.5–8.1)

## 2022-11-24 LAB — CBC
HCT: 31 % — ABNORMAL LOW (ref 39.0–52.0)
Hemoglobin: 9.9 g/dL — ABNORMAL LOW (ref 13.0–17.0)
MCH: 31.3 pg (ref 26.0–34.0)
MCHC: 31.9 g/dL (ref 30.0–36.0)
MCV: 98.1 fL (ref 80.0–100.0)
Platelets: 172 10*3/uL (ref 150–400)
RBC: 3.16 MIL/uL — ABNORMAL LOW (ref 4.22–5.81)
RDW: 14.4 % (ref 11.5–15.5)
WBC: 4.2 10*3/uL (ref 4.0–10.5)
nRBC: 0 % (ref 0.0–0.2)

## 2022-11-24 MED ORDER — RANOLAZINE ER 1000 MG PO TB12: 1000.0000 mg | ORAL_TABLET | Freq: Two times a day (BID) | ORAL | 5 refills | Status: DC

## 2022-11-24 MED ORDER — ROSUVASTATIN CALCIUM 40 MG PO TABS: 40.0000 mg | ORAL_TABLET | Freq: Every day | ORAL | 5 refills | Status: DC

## 2022-11-24 MED ORDER — ALBUTEROL SULFATE (2.5 MG/3ML) 0.083% IN NEBU: 2.5000 mg | INHALATION_SOLUTION | RESPIRATORY_TRACT | 3 refills | Status: AC | PRN

## 2022-11-24 MED ORDER — CLOPIDOGREL BISULFATE 75 MG PO TABS: 75.0000 mg | ORAL_TABLET | Freq: Every day | ORAL | 11 refills | Status: DC

## 2022-11-24 MED ORDER — GUAIFENESIN ER 600 MG PO TB12: 600.0000 mg | ORAL_TABLET | Freq: Two times a day (BID) | ORAL | 0 refills | Status: DC

## 2022-11-24 MED ORDER — POTASSIUM CHLORIDE CRYS ER 20 MEQ PO TBCR: 20.0000 meq | EXTENDED_RELEASE_TABLET | Freq: Every day | ORAL | 3 refills | Status: DC

## 2022-11-24 MED ORDER — NITROGLYCERIN 0.4 MG SL SUBL: 0.4000 mg | SUBLINGUAL_TABLET | SUBLINGUAL | 5 refills | Status: DC | PRN

## 2022-11-24 MED ORDER — FUROSEMIDE 20 MG PO TABS: 20.0000 mg | ORAL_TABLET | Freq: Two times a day (BID) | ORAL | 5 refills | Status: DC

## 2022-11-24 MED ORDER — ISOSORBIDE MONONITRATE ER 120 MG PO TB24: 120.0000 mg | ORAL_TABLET | Freq: Every morning | ORAL | 11 refills | Status: DC

## 2022-11-24 MED ORDER — ALBUTEROL SULFATE HFA 108 (90 BASE) MCG/ACT IN AERS: 2.0000 | INHALATION_SPRAY | Freq: Four times a day (QID) | RESPIRATORY_TRACT | 3 refills | Status: AC | PRN

## 2022-11-24 MED ORDER — BREZTRI AEROSPHERE 160-9-4.8 MCG/ACT IN AERO: 2.0000 | INHALATION_SPRAY | Freq: Two times a day (BID) | RESPIRATORY_TRACT | 5 refills | Status: DC

## 2022-11-24 NOTE — Progress Notes (Signed)
   11/24/22 1205  ReDS Vest / Clip  Station Marker D  Ruler Value 34  ReDS Value Range < 36  ReDS Actual Value 33  Anatomical Comments  (pt sitting upright)

## 2022-11-24 NOTE — Progress Notes (Addendum)
Patient discharged home today, transported home by family. Discharge summary went over with patient by Dustin [name]. Belongings sent with patient. Contacted spouse Dustin [name] to go over discharge paperwork, she requested I go over it with Dustin [name] spoke with Dustin [name] verbalized understanding. Patient did not want to wait on home oxygen, verbalized by family that patient has home oxygen and that patient has had it in the past but refuses to wear it. Patient safe at home and informed both patient and family and educated them that patient needs to wear 2 liters of oxygen. Dustin [name] stated they did not need any oxygen for at home. Dustin [name] made aware by Dustin [name] regarding sending oxygen to patients residents.

## 2022-11-24 NOTE — TOC Transition Note (Signed)
Transition of Care [facility]) - CM/SW Discharge Note   Patient Details  Name: Dustin [name] MRN: [id number] Date of Birth: 1934/11/21  Transition of [facility]) CM/SW Contact:  Boneta Lucks, RN Phone Number: 11/24/2022, 11:32 AM   Clinical Narrative:   Patient discharging home today need home oxygen. Patient has used [employer] in the past, still has tanks at home but has not been using it. New referral sent to [facility] at [place].  PT recommended HHPT, Patient need OT for breathing and RN for education. Patient is agreeable, no preferences, Marjory Lies with [facility] accepted the referral. Orders placed.     Final next level of care: [place] Barriers to Discharge: Continued Medical Work up   Patient Goals and CMS Choice Patient states their goals for this hospitalization and ongoing recovery are:: to go home. CMS Medicare.gov Compare Post Acute Care list provided to:: Patient Choice offered to / list presented to : Patient  Discharge Placement         Patient and family notified of of transfer: 11/24/22  Discharge Plan and Services             DME Arranged: Oxygen DME Agency: [employer] Date DME Agency Contacted: 11/24/22 Time DME Agency Contacted: [id number] Representative spoke with at DME Agency: Erasmo Downer HH Arranged: PT, OT HH Agency: [place] Date [place]: 11/24/22 Time [place]: 1131    Readmission Risk Interventions    02/12/2021   11:33 AM  Readmission Risk Prevention Plan  PCP or Specialist appointment within 3-5 days of discharge Not Complete  PCP/Specialist Appt Not Complete comments first available was 02/23/21  [place] or [place] Complete  SW Recovery Care/Counseling Consult Complete  Palliative Care Screening Not [place] Not Applicable

## 2022-11-24 NOTE — Progress Notes (Signed)
Rounding Note    Patient Name: Dustin [name] Date of Encounter: 11/24/2022  [place] Cardiologist: Rozann Lesches, MD   Subjective   Denies any recurrent pain overnight or this morning. Breathing at baseline. No orthopnea or PND.   Inpatient Medications    Scheduled Meds:  alfuzosin  10 mg Oral Q breakfast   clopidogrel  75 mg Oral Daily   dutasteride  0.5 mg Oral Q1400   enoxaparin (LOVENOX) injection  40 mg Subcutaneous Q24H   escitalopram  20 mg Oral q morning   furosemide  40 mg Intravenous Daily   guaiFENesin  600 mg Oral BID   ipratropium-albuterol  3 mL Nebulization BID   isosorbide mononitrate  120 mg Oral q morning   levothyroxine  75 mcg Oral Daily   memantine  10 mg Oral BID   mometasone-formoterol  2 puff Inhalation BID   pantoprazole  40 mg Oral Daily   potassium chloride SA  20 mEq Oral Daily   ranolazine  1,000 mg Oral BID   rosuvastatin  40 mg Oral q1800   umeclidinium bromide  1 puff Inhalation Daily   Continuous Infusions:  PRN Meds: acetaminophen **OR** acetaminophen, albuterol, nitroGLYCERIN, ondansetron **OR** ondansetron (ZOFRAN) IV, oxycodone, QUEtiapine   Vital Signs    Vitals:   11/24/22 0139 11/24/22 0431 11/24/22 0709 11/24/22 0817  BP: 126/68 (!) 141/65    Pulse: (!) 57 (!) 58    Resp: 19 19    Temp: 98 F (36.7 C) 98.3 F (36.8 C)    TempSrc:      SpO2: (!) 88% 94% 96% 96%  Weight:      Height:        Intake/Output Summary (Last 24 hours) at 11/24/2022 0914 Last data filed at 11/24/2022 0500 Gross per 24 hour  Intake 509.5 ml  Output 1575 ml  Net -1065.5 ml      11/23/2022    1:37 PM 11/22/2022   11:42 PM 09/02/2022    2:29 PM  Last 3 Weights  Weight (lbs) 213 lb 3 oz 207 lb 3.7 oz 207 lb  Weight (kg) 96.7 kg 94 kg 93.895 kg      Telemetry    NSR, HR in 50's to 70's with occasional PVC's and couplets.  - Personally Reviewed  ECG    No new tracings.   Physical Exam   GEN: Pleasant, elderly  male appearing in no acute distress.   Neck: No JVD Cardiac: RRR, no murmurs, rubs, or gallops.  Respiratory: Clear to auscultation bilaterally without wheezing or rales. GI: Soft, nontender, non-distended  MS: Trace edema along RLE; No edema along LLE. No deformity. Neuro:  Nonfocal  Psych: A&Ox2 (person, place)  Labs    High Sensitivity Troponin:   Recent Labs  Lab 11/23/22 0008 11/23/22 0440  TROPONINIHS 14 18*     Chemistry Recent Labs  Lab 11/23/22 0008 11/23/22 0959 11/24/22 0401  NA 136  --  138  K 3.8  --  3.5  CL 102  --  104  CO2 26  --  28  GLUCOSE 131*  --  112*  BUN 26*  --  20  CREATININE 1.35* 1.15 1.13  CALCIUM 8.6*  --  8.4*  PROT 6.7  --  6.1*  ALBUMIN 3.5  --  3.0*  AST 22  --  16  ALT 15  --  13  ALKPHOS 68  --  48  BILITOT 0.9  --  0.7  GFRNONAA 51* >60 >60  ANIONGAP 8  --  6    Lipids No results for input(s): "CHOL", "TRIG", "HDL", "LABVLDL", "LDLCALC", "CHOLHDL" in the last 168 hours.  Hematology Recent Labs  Lab 11/23/22 0008 11/23/22 0959 11/24/22 0401  WBC 4.6 5.3 4.2  RBC 3.40* 3.50* 3.16*  HGB 10.6* 10.8* 9.9*  HCT 32.4* 33.4* 31.0*  MCV 95.3 95.4 98.1  MCH 31.2 30.9 31.3  MCHC 32.7 32.3 31.9  RDW 14.4 14.4 14.4  PLT 178 174 172   Thyroid No results for input(s): "TSH", "FREET4" in the last 168 hours.  BNP Recent Labs  Lab 11/23/22 0008  BNP 150.0*    DDimer No results for input(s): "DDIMER" in the last 168 hours.   Radiology     CT ABDOMEN PELVIS W CONTRAST  Result Date: 11/23/2022 CLINICAL DATA:  Abdominal and chest pain. Suspected bowel obstruction. EXAM: CT ABDOMEN AND PELVIS WITH CONTRAST TECHNIQUE: Multidetector CT imaging of the abdomen and pelvis was performed using the standard protocol following bolus administration of intravenous contrast. RADIATION DOSE REDUCTION: This exam was performed according to the departmental dose-optimization program which includes automated exposure control, adjustment of the mA  and/or kV according to patient size and/or use of iterative reconstruction technique. CONTRAST:  141m OMNIPAQUE IOHEXOL 300 MG/ML  SOLN COMPARISON:  07/29/2021 FINDINGS: Lower Chest: No acute findings. Hepatobiliary: No hepatic masses identified. Gallbladder is unremarkable. No evidence of biliary ductal dilatation. Pancreas:  No mass or inflammatory changes. Spleen: Within normal limits in size and appearance. Adrenals/Urinary Tract: 3 mm calculus noted in upper pole of left kidney. No evidence of ureteral calculi or hydronephrosis. Multiple small benign-appearing right renal cysts are again noted (no followup imaging is recommended). Stomach/Bowel: No evidence of bowel obstruction, inflammatory process or abnormal fluid collections. Normal appendix visualized. Diverticulosis is seen mainly involving the descending and sigmoid colon, however there is no evidence of diverticulitis. Vascular/Lymphatic: No pathologically enlarged lymph nodes. No acute vascular findings. Aortic atherosclerotic calcification incidentally noted. Reproductive:  No mass or other significant abnormality. Other:  None. Musculoskeletal:  No suspicious bone lesions identified. IMPRESSION: No evidence of bowel obstruction or other acute findings. Colonic diverticulosis, without radiographic evidence of diverticulitis. Tiny left renal calculus. No evidence of ureteral calculi or hydronephrosis. Aortic Atherosclerosis (ICD10-I70.0). Electronically Signed   By: JMarlaine HindM.D.   On: 11/23/2022 11:20   CT HEAD WO CONTRAST (5MM)  Result Date: 11/23/2022 CLINICAL DATA:  Altered mental status EXAM: CT HEAD WITHOUT CONTRAST TECHNIQUE: Contiguous axial images were obtained from the base of the skull through the vertex without intravenous contrast. RADIATION DOSE REDUCTION: This exam was performed according to the departmental dose-optimization program which includes automated exposure control, adjustment of the mA and/or kV according to patient  size and/or use of iterative reconstruction technique. COMPARISON:  None Available. FINDINGS: Brain: There is no mass, hemorrhage or extra-axial collection. The size and configuration of the ventricles and extra-axial CSF spaces are normal. The brain parenchyma is normal, without acute or chronic infarction. Vascular: Atherosclerotic calcification of the internal carotid arteries at the skull base. No abnormal hyperdensity of the major intracranial arteries or dural venous sinuses. Skull: The visualized skull base, calvarium and extracranial soft tissues are normal. Sinuses/Orbits: No fluid levels or advanced mucosal thickening of the visualized paranasal sinuses. No mastoid or middle ear effusion. The orbits are normal. IMPRESSION: No acute intracranial abnormality. Electronically Signed   By: KUlyses JarredM.D.   On: 11/23/2022 00:48   [facility]  Result Date: 11/23/2022 CLINICAL DATA:  Cough and shortness of breath EXAM: PORTABLE CHEST 1 VIEW COMPARISON:  08/09/2022 FINDINGS: Cardiac shadow is enlarged. Postsurgical changes are again noted. The lungs are well aerated without focal infiltrate or effusion. Minimal right basilar atelectasis is seen. No bony abnormality is noted. IMPRESSION: Mild right basilar atelectasis. Electronically Signed   By: Inez Catalina M.D.   On: 11/23/2022 00:38    Cardiac Studies   Limited Echo: 11/23/2022 IMPRESSIONS     1. Left ventricular ejection fraction, by estimation, is 60 to 65%. The  left ventricle has normal function. The left ventricle demonstrates  regional wall motion abnormalities (see scoring diagram/findings for  description). The left ventricular internal  cavity size was mildly dilated. There is mild concentric left ventricular  hypertrophy. Left ventricular diastolic function could not be evaluated.   2. Right ventricular systolic function is normal. The right ventricular  size is normal. Tricuspid regurgitation signal is inadequate for  assessing  PA pressure.   3. The mitral valve is degenerative. Mild mitral valve regurgitation. The  mean mitral valve gradient is 2.0 mmHg.   4. The aortic valve is tricuspid. There is mild calcification of the  aortic valve. Aortic valve regurgitation is not visualized. Aortic valve  sclerosis/calcification is present, without any evidence of aortic  stenosis.   5. The inferior vena cava is dilated in size with >50% respiratory  variability, suggesting right atrial pressure of 8 mmHg.   Comparison(s): Prior images reviewed side by side. LVEF normal range at  60-65% with similar wall motion abnormality. Diastolic function not  assessed.   Patient Profile     88 y.o. male w/ PMH of CAD (s/p CABG in 1996, DESx2 to SVG-PDA in 2012, DES to distal LM in 11/2019, cath in 02/2021 showing patent LM stent, patent LIMA-LAD, patent SVG-OM1 with chronically occluded jump limb to OM2 and chronic occlusion of SVG-D2 and SVG-PDA and no targets for intervention), HFmrEF (EF 45-50% in 02/2021, 50-55% in 07/2022), LBBB, HTN, HLD and dementia who is currently admitted for evaluation of chest pain.   Assessment & Plan    1. Chest Pain with Mixed Features - Chest discomfort on admission was occurring at rest but would improve with SL NTG. Overall history limited secondary to dementia but he denies any recurrent pain overnight or this morning. He has ruled out for ACS with Hs Troponin values being flat at 14 and 18. Echo shows improvement in his EF to 60-65%.  - Anti-anginal therapy has been adjusted with Ranexa being titrated to '1000mg'$  BID. Continue Plavix 75 mg daily, Imdur 60 mg twice daily and Crestor 40 mg daily. Will make sure he has outpatient follow-up arranged.    2. CAD - He is s/p CABG in 1996 with DESx2 to SVG-PDA in 2012, DES to distal LM in 11/2019 and cath in 02/2021 showed patent LM stent, patent LIMA-LAD, patent SVG-OM1 with chronically occluded jump limb to OM2 and chronic occlusion of SVG-D2  and SVG-PDA and no targets for intervention. - Medical management has been continued during his prior admissions and repeat echo shows his EF has continued to improve, now at 60-65%. No plans for further testing at this time. Continue Plavix 75 mg daily, Imdur 120 mg daily, Ranexa 1000 mg twice daily and Crestor 40 mg daily.   3. HFimpEF - His EF was at 45-50% in 02/2021, improved to 50-55% by echo in 07/2022. BNP at 150 on admission and repeat limited echo  shows his EF has further improved to 60-65% with WMA similar to prior imaging and diastolic dysfunction unable to be determined. - He has received 2 doses of IV Lasix this admission with an output of -1.1 L but was also receiving IV fluids yesterday (these were not stopped until 2200 on 11/23/2022). He was on PO Lasix '20mg'$  BID at home so would plan to resume at discharge. Not on beta-blocker therapy due to baseline bradycardia and previously intolerant to Spironolactone secondary to dizziness. Has not been considered an SGLT2 inhibitor candidate due to frequent UTI's.    4. Baseline Bradycardia - Recent outpatient monitor showed occasional PAC's and PVC's representing less than 1% of total beats and 20 brief episodes of PSVT with the longest lasting for 7 beats but no significant pauses. Remains off AV nodal blocking agents given baseline HR in the 50's.    5. HLD - His LDL was at 33 in 07/2022. Remains on Crestor '40mg'$  daily.   6. Anemia - Hgb at 9.9 this AM. No reports of active bleeding. Management per the admitting team.   For questions or updates, please contact [place] Please consult [email or web address] for contact info under        Signed, Erma Heritage, PA-C  11/24/2022, 9:14 AM

## 2022-11-24 NOTE — Discharge Instructions (Signed)
1)Please note that some of your medications including your Ranexa has been adjusted 2)You need oxygen at home at 2 L via nasal cannula continuously while awake and while asleep--- smoking or having open fires around oxygen can cause fire, significant injury and death 3)Very low-salt diet advised 4)Weigh yourself daily, call if you gain more than 3 pounds in 1 day or more than 5 pounds in 1 week as your diuretic medications may need to be adjusted 5)Avoid ibuprofen/Advil/Aleve/Motrin/Goody Powders/Naproxen/BC powders/Meloxicam/Diclofenac/Indomethacin and other Nonsteroidal anti-inflammatory medications as these will make you more likely to bleed and can cause stomach ulcers, can also cause Kidney problems.

## 2022-11-24 NOTE — Discharge Summary (Signed)
Dustin [name], is a 88 y.o. male  DOB 1934-03-29  MRN [id number].  Admission date:  11/22/2022  Admitting Physician  Bernadette Hoit, DO  Discharge Date:  11/24/2022   Primary MD  [name], MD  Recommendations for primary care physician for things to follow:   1)Please note that some of your medications including your Ranexa has been adjusted 2)You need oxygen at home at 2 L via nasal cannula continuously while awake and while asleep--- smoking or having open fires around oxygen can cause fire, significant injury and death 3)Very low-salt diet advised 4)Weigh yourself daily, call if you gain more than 3 pounds in 1 day or more than 5 pounds in 1 week as your diuretic medications may need to be adjusted 5)Avoid ibuprofen/Advil/Aleve/Motrin/Goody Powders/Naproxen/BC powders/Meloxicam/Diclofenac/Indomethacin and other Nonsteroidal anti-inflammatory medications as these will make you more likely to bleed and can cause stomach ulcers, can also cause Kidney problems.  Admission Diagnosis  Unstable angina (HCC) [I20.0] Atypical chest pain [R07.89] Altered mental status, unspecified altered mental status type [R41.82]   Discharge Diagnosis  Unstable angina ([place]) [I20.0] Atypical chest pain [R07.89] Altered mental status, unspecified altered mental status type [R41.82]    Principal Problem:   Atypical chest pain Active Problems:   Hyperlipidemia   Alzheimer disease (HCC)   HYPERTENSION, BENIGN   Coronary artery disease   Hypothyroidism   Chronic diastolic CHF (congestive heart failure) (HCC)   COPD (chronic obstructive pulmonary disease) (HCC)   Abdominal distention      Past Medical History:  Diagnosis Date   Alzheimer disease (HCC)    Anxiety    Arthritis    Atrophic gastritis    a. By EGD 02/2013.   Carotid artery disease (HCC)    a. mild-mod plaque, <50% stenosis bilat by duplex 2018.    Coronary atherosclerosis of native coronary artery    a. Multivessel s/p CABG 1996. b. DESx2 to SVG-PDA in 2012 c. DES to distal LM in 11/2019 d. cath in 02/2021 showing patent LM stent, patent LIMA-LAD, patent SVG-OM1 with chronically occluded jump limb to OM2 and chronic occlusion of SVG-D2 and SVG-PDA and no targets for intervention   DDD (degenerative disc disease)    Chronic back pain   Dementia (HCC)    Enlarged prostate    Essential hypertension    Hematuria    Hypothyroidism    LBBB (left bundle branch block)    MI (myocardial infarction) ([place])    Mixed hyperlipidemia    OA (osteoarthritis)    OSA (obstructive sleep apnea)    Pneumonia due to COVID-19 virus    February 2021   Sinus bradycardia    a. Aricept and BB discontinued due to this.    Past Surgical History:  Procedure Laterality Date   CATARACT EXTRACTION W/PHACO Left 03/22/2022   Procedure: CATARACT EXTRACTION PHACO AND INTRAOCULAR LENS PLACEMENT (IOC);  Surgeon: Baruch Goldmann, MD;  Location: [facility];  Service: Ophthalmology;  Laterality: Left;  CDE: 25.22   COLONOSCOPY  08/03/2004   Jenkins-numerous large  diverticula in the descending, transverse, descending, and sigmoid colon. Otherwise normal exam.   COLONOSCOPY  07/12/2012   RMR: External hemorrhoidal tag; multiple rectal and colonic polyps removed and/or treated as described above. Pancolonic diverticulosis. Bx-tubular adenomas, rectal hyperplastic polyp. next colonoscopy in 06/2015.   COLONOSCOPY N/A 06/22/2016   Procedure: COLONOSCOPY;  Surgeon: Aviva Signs, MD;  Location: [facility];  Service: Gastroenterology;  Laterality: N/A;  49   CORONARY ANGIOPLASTY WITH STENT PLACEMENT     CORONARY ARTERY BYPASS GRAFT  1996   LIMA to LAD, SVG to D2, SVG to PDA, SVG to OM1 and OM2   CORONARY STENT INTERVENTION N/A 12/10/2019   Procedure: CORONARY STENT INTERVENTION;  Surgeon: Burnell Blanks, MD;  Location: [place] [facility];  Service: Cardiovascular;   Laterality: N/A;   ESOPHAGOGASTRODUODENOSCOPY N/A 03/16/2013   Procedure: ESOPHAGOGASTRODUODENOSCOPY (EGD);  Surgeon: Daneil Dolin, MD;  Location: [facility];  Service: Endoscopy;  Laterality: N/A;  12:00-moved to [place] notified pt   ESOPHAGOGASTRODUODENOSCOPY N/A 03/06/2013   Procedure: ESOPHAGOGASTRODUODENOSCOPY (EGD);  Surgeon: Daneil Dolin, MD;  Location: [facility];  Service: Endoscopy;  Laterality: N/A;   HERNIA REPAIR     INTRAVASCULAR ULTRASOUND/IVUS N/A 12/10/2019   Procedure: Intravascular Ultrasound/IVUS;  Surgeon: Burnell Blanks, MD;  Location: [facility] [facility];  Service: Cardiovascular;  Laterality: N/A;   LEFT HEART CATH AND CORS/GRAFTS ANGIOGRAPHY N/A 12/10/2019   Procedure: LEFT HEART CATH AND CORS/GRAFTS ANGIOGRAPHY;  Surgeon: Burnell Blanks, MD;  Location: [place] [facility];  Service: Cardiovascular;  Laterality: N/A;   LEFT HEART CATH AND CORS/GRAFTS ANGIOGRAPHY N/A 03/02/2021   Procedure: LEFT HEART CATH AND CORS/GRAFTS ANGIOGRAPHY;  Surgeon: Nelva Bush, MD;  Location: [place] [facility];  Service: Cardiovascular;  Laterality: N/A;     HPI  from the history and physical done on the day of admission:   Chief Complaint: chest pain, confusion   HPI: Dustin [name] is a 88 y.o. male with medical history significant of coronary artery disease, hypertension, dementia, chronic pain, BPH, who was brought to the hospital by family due to concerns of confusion and persistent chest pain.  It was reported that patient has been having substernal to left-sided chest pain with radiation to left shoulder.  This has been intermittently occurring for the past few days.  She has been using his nitroglycerin more frequently.  His son reports that when he has increased pain, he tends to get more confused.  Reports his oral intake has been normal for him.  Bowel movements have been normal for him.  He has not had any fever or dysuria.  He does describe a  chronic cough and is producing tan-colored sputum.  He says he does feel short of breath.  He says he often gets choked up when trying to eat or drink.   ED Course: EKG shows sinus with left bundle branch block.  Initial troponin was normal, follow-up mildly elevated at 18.  He did have elevated BUN and creatinine from his baseline.  CT head noted to be unremarkable.   Review of Systems: As per HPI otherwise 10 point review of systems negative.       Hospital Course:   Assessment and Plan: 1) chest pain in a patient with history of CAD-- CAD s/p CABG in 1996 with repeat cath in 02/2021 showing patent LM stent, patent LIMA to LAD, patent SVG to OM1 with occluded jump graft to OM 2, SVG to D2  and SVG to PDA (no targets for intervention and medical management was recommended)   --Ruled out for ACS by EKG and troponins -Cardiology input appreciated -Patient is chest pain-free even after ambulating around the unit couple times -Cardiology recommends discharge home with medication adjustments as outlined in discharge instructions and discharge med rec -Outpatient follow-up with cardiology advised   2)HFpEF--acute on chronic diastolic dysfunction CHF extubation--- improved after IV Lasix/diuresis -REDs Vest 33 % -Ambulating without dyspnea on exertion or hypoxia  3)COPD--no acute exacerbation at this time chest x-ray without acute findings -CT abdomen and pelvis without acute findings -Discharge home with bronchodilators -Did describe shortness of breath and cough -Continue bronchodilators, resume home inhalers -Mucinex, flutter valve -Chest x-ray does not show any pneumonia, does show atelectasis -Since he does report increased cough with p.o. intake, will request SLP evaluation   4) dysphagia and possible aspiration concerns--- speech pathology eval appreciated -Recommended dysphagia 3/mechanically soft diet and thin liquids   5)Generalized weakness and deconditioning----therapy eval  appreciated recommends home health PT   6)Hypothyroidism -Continue Synthroid   7)Dementia -Continue Namenda   Discharge Condition: stable  Follow UP   Follow-up Information     Finis Bud, NP Follow up on 12/23/2022.   Specialty: Nurse Practitioner Why: Cardiology Follow-up on 12/23/2022 at 3:00 PM. Will be at the [facility] OFFICE. Contact information: [place] [address] [phone]         [place] Follow up.   Why: [place] Follow up.   Why: Home health will call to schedule your first home visit.                 Consults obtained - cardiology  Diet and Activity recommendation:  As advised  Discharge Instructions    Discharge Instructions     Call MD for:  difficulty breathing, headache or visual disturbances   Complete by: As directed    Call MD for:  persistant dizziness or light-headedness   Complete by: As directed    Call MD for:  persistant nausea and vomiting   Complete by: As directed    Call MD for:  temperature >100.4   Complete by: As directed    Diet - low sodium heart healthy   Complete by: As directed    Discharge instructions   Complete by: As directed    1)Please note that some of your medications including your Ranexa has been adjusted 2)You need oxygen at home at 2 L via nasal cannula continuously while awake and while asleep--- smoking or having open fires around oxygen can cause fire, significant injury and death 3)Very low-salt diet advised 4)Weigh yourself daily, call if you gain more than 3 pounds in 1 day or more than 5 pounds in 1 week as your diuretic medications may need to be adjusted 5)Avoid ibuprofen/Advil/Aleve/Motrin/Goody Powders/Naproxen/BC powders/Meloxicam/Diclofenac/Indomethacin and other Nonsteroidal anti-inflammatory medications as these will make you more likely to bleed and can cause stomach ulcers, can also cause Kidney problems.   For home  use only DME oxygen   Complete by: As directed    Patient Saturations on Room Air at Rest = 95%  Patient Saturations on Room Air while Ambulating = 87%  Patient Saturations on 2 Liters of oxygen while Ambulating = 94%  Please briefly explain why patient needs home oxygen: pt desats when ambulating without 02 as low as 87%.   Length of Need: Lifetime  Mode or (Route): Nasal cannula   Liters per Minute: 2   Frequency: Continuous (stationary and portable oxygen unit needed)   Oxygen conserving device: Yes   Oxygen delivery system: Gas   Increase activity slowly   Complete by: As directed          Discharge Medications     Allergies as of 11/24/2022   No Known Allergies      Medication List     TAKE these medications    albuterol (2.5 MG/3ML) 0.083% nebulizer solution Commonly known as: PROVENTIL Take 3 mLs (2.5 mg total) by nebulization every 2 (two) hours as needed for wheezing or shortness of breath. What changed: See the new instructions.   albuterol 108 (90 Base) MCG/ACT inhaler Commonly known as: VENTOLIN HFA Inhale 2 puffs into the lungs every 6 (six) hours as needed for wheezing or shortness of breath. What changed: Another medication with the same name was changed. Make sure you understand how and when to take each.   alfuzosin 10 MG 24 hr tablet Commonly known as: UROXATRAL Take 1 tablet (10 mg total) by mouth daily with breakfast.   Breztri Aerosphere 160-9-4.8 MCG/ACT Aero Generic drug: Budeson-Glycopyrrol-Formoterol Inhale 2 puffs into the lungs in the morning and at bedtime.   clopidogrel 75 MG tablet Commonly known as: PLAVIX Take 1 tablet (75 mg total) by mouth daily.   dutasteride 0.5 MG capsule Commonly known as: AVODART Take 1 capsule (0.5 mg total) by mouth daily. In the afternoon   escitalopram 20 MG tablet Commonly known as: LEXAPRO Take 20 mg by mouth every morning.   furosemide 20 MG tablet Commonly known as: LASIX Take 1 tablet  (20 mg total) by mouth 2 (two) times daily. For fluid What changed: additional instructions   guaiFENesin 600 MG 12 hr tablet Commonly known as: MUCINEX Take 1 tablet (600 mg total) by mouth 2 (two) times daily.   isosorbide mononitrate 120 MG 24 hr tablet Commonly known as: IMDUR Take 1 tablet (120 mg total) by mouth every morning.   levothyroxine 75 MCG tablet Commonly known as: SYNTHROID Take 75 mcg by mouth daily.   memantine 10 MG tablet Commonly known as: NAMENDA Take 10 mg by mouth 2 (two) times daily.   nitroGLYCERIN 0.4 MG SL tablet Commonly known as: NITROSTAT Place 1 tablet (0.4 mg total) under the tongue every 5 (five) minutes as needed for chest pain.   oxycodone 30 MG immediate release tablet Commonly known as: ROXICODONE Take 30 mg by mouth every 6 (six) hours as needed for pain.   potassium chloride SA 20 MEQ tablet Commonly known as: KLOR-CON M Take 1 tablet (20 mEq total) by mouth daily.   QUEtiapine 25 MG tablet Commonly known as: SEROQUEL Take 25 mg by mouth at bedtime as needed (sleep).   ranolazine 1000 MG SR tablet Commonly known as: Ranexa Take 1 tablet (1,000 mg total) by mouth 2 (two) times daily. What changed:  medication strength how much to take   rosuvastatin 40 MG tablet Commonly known as: CRESTOR Take 1 tablet (40 mg total) by mouth daily at 6 PM.               Durable Medical Equipment  (From admission, onward)           Start     Ordered   11/24/22 0000  For home use only DME oxygen       Comments: Patient Saturations on Room Air at Rest = 95%  Patient Saturations on Room Air while Ambulating = 87%  Patient Saturations on 2 Liters of oxygen while Ambulating = 94%  Please briefly explain why patient needs home oxygen: pt desats when ambulating without 02 as low as 87%.  Question Answer Comment  Length of Need Lifetime   Mode or (Route) Nasal cannula   Liters per Minute 2   Frequency Continuous (stationary and  portable oxygen unit needed)   Oxygen conserving device Yes   Oxygen delivery system Gas      11/24/22 1123            Major procedures and Radiology Reports - PLEASE review detailed and final reports for all details, in brief -   ECHOCARDIOGRAM LIMITED  Result Date: 11/23/2022    ECHOCARDIOGRAM LIMITED REPORT   Patient Name:   Dustin [name] Date of Exam: 11/23/2022 Medical Rec #:  [id number]       Height:       70.0 in Accession #:    [id number]      Weight:       213.2 lb Date of Birth:  [birth year]       BSA:          2.145 m Patient Age:    73 years        BP:           130/86 mmHg Patient Gender: M               HR:           63 bpm. Exam Location:  [facility] Procedure: Limited Echo, Limited Color Doppler and Cardiac Doppler Indications:    I50.31 CHF, eval LVEF and Diastolic Function  History:        Patient has prior history of Echocardiogram examinations, most                 recent 08/09/2022. CHF, CAD and Previous Myocardial Infarction,                 COPD, Arrythmias:Bradycardia; Risk Factors:Hypertension and                 Dyslipidemia.  Sonographer:    Alvino Chapel RCS Referring Phys: [id number] [place]  1. Left ventricular ejection fraction, by estimation, is 60 to 65%. The left ventricle has normal function. The left ventricle demonstrates regional wall motion abnormalities (see scoring diagram/findings for description). The left ventricular internal cavity size was mildly dilated. There is mild concentric left ventricular hypertrophy. Left ventricular diastolic function could not be evaluated.  2. Right ventricular systolic function is normal. The right ventricular size is normal. Tricuspid regurgitation signal is inadequate for assessing PA pressure.  3. The mitral valve is degenerative. Mild mitral valve regurgitation. The mean mitral valve gradient is 2.0 mmHg.  4. The aortic valve is tricuspid. There is mild calcification of the aortic valve. Aortic valve  regurgitation is not visualized. Aortic valve sclerosis/calcification is present, without any evidence of aortic stenosis.  5. The inferior vena cava is dilated in size with >50% respiratory variability, suggesting right atrial pressure of 8 mmHg. Comparison(s): Prior images reviewed side by side. LVEF normal range at 60-65% with similar wall motion abnormality. Diastolic function not assessed. FINDINGS  Left Ventricle: Left ventricular ejection fraction, by estimation, is 60 to 65%. The left ventricle has normal function. The left ventricle demonstrates regional wall motion abnormalities. The left ventricular internal cavity size was mildly dilated. There is mild concentric  left ventricular hypertrophy. Left ventricular diastolic function could not be evaluated.  LV Wall Scoring: The basal inferolateral segment and basal inferior segment are hypokinetic. The entire anterior wall, antero-lateral wall, mid and distal lateral wall, entire septum, entire apex, and mid and distal inferior wall are normal. Right Ventricle: The right ventricular size is normal. No increase in right ventricular wall thickness. Right ventricular systolic function is normal. Tricuspid regurgitation signal is inadequate for assessing PA pressure. Mitral Valve: The mitral valve is degenerative in appearance. Mild mitral annular calcification. Mild mitral valve regurgitation, with posteriorly-directed jet. MV peak gradient, 5.8 mmHg. The mean mitral valve gradient is 2.0 mmHg. Aortic Valve: The aortic valve is tricuspid. There is mild calcification of the aortic valve. There is mild to moderate aortic valve annular calcification. Aortic valve regurgitation is not visualized. Aortic valve sclerosis/calcification is present, without any evidence of aortic stenosis. Aorta: The aortic root is normal in size and structure. Venous: The inferior vena cava is dilated in size with greater than 50% respiratory variability, suggesting right atrial  pressure of 8 mmHg. IAS/Shunts: No atrial level shunt detected by color flow Doppler. LEFT VENTRICLE PLAX 2D LVIDd:         5.70 cm LVIDs:         3.60 cm LV PW:         1.30 cm LV IVS:        1.10 cm LVOT diam:     1.90 cm LVOT Area:     2.84 cm  RIGHT VENTRICLE TAPSE (M-mode): 2.0 cm LEFT ATRIUM         Index LA diam:    4.40 cm 2.05 cm/m   AORTA Ao Root diam: 3.60 cm MITRAL VALVE MV Area (PHT): 3.53 cm     SHUNTS MV Peak grad:  5.8 mmHg     Systemic Diam: 1.90 cm MV Mean grad:  2.0 mmHg MV Vmax:       1.20 m/s MV Vmean:      57.1 cm/s MV Decel Time: 215 msec MV E velocity: 121.00 cm/s MV A velocity: 115.00 cm/s MV E/A ratio:  1.05 Rozann Lesches MD Electronically signed by Rozann Lesches MD Signature Date/Time: 11/23/2022/5:31:46 PM    Final    CT ABDOMEN PELVIS W CONTRAST  Result Date: 11/23/2022 CLINICAL DATA:  Abdominal and chest pain. Suspected bowel obstruction. EXAM: CT ABDOMEN AND PELVIS WITH CONTRAST TECHNIQUE: Multidetector CT imaging of the abdomen and pelvis was performed using the standard protocol following bolus administration of intravenous contrast. RADIATION DOSE REDUCTION: This exam was performed according to the departmental dose-optimization program which includes automated exposure control, adjustment of the mA and/or kV according to patient size and/or use of iterative reconstruction technique. CONTRAST:  166m OMNIPAQUE IOHEXOL 300 MG/ML  SOLN COMPARISON:  07/29/2021 FINDINGS: Lower Chest: No acute findings. Hepatobiliary: No hepatic masses identified. Gallbladder is unremarkable. No evidence of biliary ductal dilatation. Pancreas:  No mass or inflammatory changes. Spleen: Within normal limits in size and appearance. Adrenals/Urinary Tract: 3 mm calculus noted in upper pole of left kidney. No evidence of ureteral calculi or hydronephrosis. Multiple small benign-appearing right renal cysts are again noted (no followup imaging is recommended). Stomach/Bowel: No evidence of bowel  obstruction, inflammatory process or abnormal fluid collections. Normal appendix visualized. Diverticulosis is seen mainly involving the descending and sigmoid colon, however there is no evidence of diverticulitis. Vascular/Lymphatic: No pathologically enlarged lymph nodes. No acute vascular findings. Aortic atherosclerotic calcification incidentally noted. Reproductive:  No mass or  other significant abnormality. Other:  None. Musculoskeletal:  No suspicious bone lesions identified. IMPRESSION: No evidence of bowel obstruction or other acute findings. Colonic diverticulosis, without radiographic evidence of diverticulitis. Tiny left renal calculus. No evidence of ureteral calculi or hydronephrosis. Aortic Atherosclerosis (ICD10-I70.0). Electronically Signed   By: Marlaine Hind M.D.   On: 11/23/2022 11:20   CT HEAD WO CONTRAST (5MM)  Result Date: 11/23/2022 CLINICAL DATA:  Altered mental status EXAM: CT HEAD WITHOUT CONTRAST TECHNIQUE: Contiguous axial images were obtained from the base of the skull through the vertex without intravenous contrast. RADIATION DOSE REDUCTION: This exam was performed according to the departmental dose-optimization program which includes automated exposure control, adjustment of the mA and/or kV according to patient size and/or use of iterative reconstruction technique. COMPARISON:  None Available. FINDINGS: Brain: There is no mass, hemorrhage or extra-axial collection. The size and configuration of the ventricles and extra-axial CSF spaces are normal. The brain parenchyma is normal, without acute or chronic infarction. Vascular: Atherosclerotic calcification of the internal carotid arteries at the skull base. No abnormal hyperdensity of the major intracranial arteries or dural venous sinuses. Skull: The visualized skull base, calvarium and extracranial soft tissues are normal. Sinuses/Orbits: No fluid levels or advanced mucosal thickening of the visualized paranasal sinuses. No  mastoid or middle ear effusion. The orbits are normal. IMPRESSION: No acute intracranial abnormality. Electronically Signed   By: Ulyses Jarred M.D.   On: 11/23/2022 00:48   DG Chest Port 1 View  Result Date: 11/23/2022 CLINICAL DATA:  Cough and shortness of breath EXAM: PORTABLE CHEST 1 VIEW COMPARISON:  08/09/2022 FINDINGS: Cardiac shadow is enlarged. Postsurgical changes are again noted. The lungs are well aerated without focal infiltrate or effusion. Minimal right basilar atelectasis is seen. No bony abnormality is noted. IMPRESSION: Mild right basilar atelectasis. Electronically Signed   By: Inez Catalina M.D.   On: 11/23/2022 00:38    Today   Subjective    Dustin [name] today has no new complaints Ambulating around the unit without dyspnea on exertion, palpitations ,dizziness or chest pains          Patient has been seen and examined prior to discharge   Objective   Blood pressure (!) 141/65, pulse (!) 58, temperature 98.3 F (36.8 C), resp. rate 19, height '5\' 10"'$  (1.778 m), weight 95.1 kg, SpO2 96 %.   Intake/Output Summary (Last 24 hours) at 11/24/2022 1135 Last data filed at 11/24/2022 0500 Gross per 24 hour  Intake 509.5 ml  Output 975 ml  Net -465.5 ml    Exam Gen:- Awake Alert, no acute distress  HEENT:- [place].AT, No sclera icterus Neck-Supple Neck,No JVD,.  Lungs-  CTAB , good air movement bilaterally CV- S1, S2 normal, regular Abd-  +ve B.Sounds, Abd Soft, No tenderness,    Extremity/Skin:- No  edema,   good pulses Psych-affect is appropriate, oriented x3 Neuro-no new focal deficits, no tremors    Data Review   CBC w Diff:  Lab Results  Component Value Date   WBC 4.2 11/24/2022   HGB 9.9 (L) 11/24/2022   HGB 12.7 (L) 02/25/2021   HCT 31.0 (L) 11/24/2022   HCT 38.7 02/25/2021   HCT 43 01/26/2013   PLT 172 11/24/2022   PLT 206 02/25/2021   LYMPHOPCT 41 11/23/2022   MONOPCT 18 11/23/2022   EOSPCT 1 11/23/2022   BASOPCT 0 11/23/2022    CMP:  Lab  Results  Component Value Date   NA 138 11/24/2022   NA 140  02/25/2021   K 3.5 11/24/2022   K 3.9 01/26/2013   CL 104 11/24/2022   CL 100 01/26/2013   CO2 28 11/24/2022   CO2 31 01/26/2013   BUN 20 11/24/2022   BUN 21 02/25/2021   CREATININE 1.13 11/24/2022   CREATININE 0.80 01/26/2013   PROT 6.1 (L) 11/24/2022   PROT 6.7 01/26/2013   ALBUMIN 3.0 (L) 11/24/2022   ALBUMIN 4.6 01/26/2013   BILITOT 0.7 11/24/2022   BILITOT 0.8 01/26/2013   ALKPHOS 48 11/24/2022   ALKPHOS 70 01/26/2013   AST 16 11/24/2022   AST 16 01/26/2013   ALT 13 11/24/2022  .  Total Discharge time is about 33 minutes  Roxan Hockey M.D on 11/24/2022 at 11:35 AM  Go to [email or web address] -  for contact info  [name] - Office  [phone]

## 2022-11-24 NOTE — Progress Notes (Signed)
SATURATION QUALIFICATIONS: (This note is used to comply with regulatory documentation for home oxygen)  Patient Saturations on Room Air at Rest = 95%  Patient Saturations on Room Air while Ambulating = 87%  Patient Saturations on 2 Liters of oxygen while Ambulating = 94%  Please briefly explain why patient needs home oxygen: pt desats when ambulating without 02 as low as 87%.

## 2022-11-24 NOTE — Care Management Obs Status (Signed)
[place] NOTIFICATION   Patient Details  Name: Dustin [name] MRN: [id number] Date of Birth: 1934/08/07   Medicare Observation Status Notification Given:  Yes    Tommy Medal 11/24/2022, 12:20 PM

## 2022-11-25 DIAGNOSIS — J449 Chronic obstructive pulmonary disease, unspecified: Secondary | ICD-10-CM | POA: Diagnosis not present

## 2022-11-25 DIAGNOSIS — I5022 Chronic systolic (congestive) heart failure: Secondary | ICD-10-CM | POA: Diagnosis not present

## 2022-11-25 DIAGNOSIS — I110 Hypertensive heart disease with heart failure: Secondary | ICD-10-CM | POA: Diagnosis not present

## 2022-12-06 DIAGNOSIS — M25571 Pain in right ankle and joints of right foot: Secondary | ICD-10-CM | POA: Diagnosis not present

## 2022-12-07 ENCOUNTER — Other Ambulatory Visit (HOSPITAL_COMMUNITY): Payer: Self-pay | Admitting: Internal Medicine

## 2022-12-07 ENCOUNTER — Ambulatory Visit (HOSPITAL_COMMUNITY)
Admission: RE | Admit: 2022-12-07 | Discharge: 2022-12-07 | Disposition: A | Discharge status: Home / Self Care | Payer: Medicare HMO | Source: Ambulatory Visit | Attending: Internal Medicine | Admitting: Internal Medicine

## 2022-12-07 DIAGNOSIS — E063 Autoimmune thyroiditis: Secondary | ICD-10-CM | POA: Diagnosis not present

## 2022-12-07 DIAGNOSIS — F0390 Unspecified dementia without behavioral disturbance: Secondary | ICD-10-CM | POA: Diagnosis not present

## 2022-12-07 DIAGNOSIS — I5022 Chronic systolic (congestive) heart failure: Secondary | ICD-10-CM | POA: Diagnosis not present

## 2022-12-07 DIAGNOSIS — R042 Hemoptysis: Secondary | ICD-10-CM

## 2022-12-07 DIAGNOSIS — J439 Emphysema, unspecified: Secondary | ICD-10-CM | POA: Diagnosis not present

## 2022-12-07 DIAGNOSIS — J1289 Other viral pneumonia: Secondary | ICD-10-CM | POA: Diagnosis not present

## 2022-12-07 DIAGNOSIS — G894 Chronic pain syndrome: Secondary | ICD-10-CM | POA: Diagnosis not present

## 2022-12-07 DIAGNOSIS — I110 Hypertensive heart disease with heart failure: Secondary | ICD-10-CM | POA: Diagnosis not present

## 2022-12-07 DIAGNOSIS — M48061 Spinal stenosis, lumbar region without neurogenic claudication: Secondary | ICD-10-CM | POA: Diagnosis not present

## 2022-12-07 DIAGNOSIS — J441 Chronic obstructive pulmonary disease with (acute) exacerbation: Secondary | ICD-10-CM | POA: Diagnosis not present

## 2022-12-07 DIAGNOSIS — J40 Bronchitis, not specified as acute or chronic: Secondary | ICD-10-CM | POA: Diagnosis not present

## 2022-12-08 ENCOUNTER — Other Ambulatory Visit (HOSPITAL_COMMUNITY): Payer: Self-pay | Admitting: Internal Medicine

## 2022-12-08 ENCOUNTER — Encounter (HOSPITAL_COMMUNITY): Payer: Self-pay | Admitting: Internal Medicine

## 2022-12-08 DIAGNOSIS — J1289 Other viral pneumonia: Secondary | ICD-10-CM

## 2022-12-13 DIAGNOSIS — L820 Inflamed seborrheic keratosis: Secondary | ICD-10-CM | POA: Diagnosis not present

## 2022-12-23 ENCOUNTER — Ambulatory Visit (HOSPITAL_COMMUNITY)
Admission: RE | Admit: 2022-12-23 | Discharge: 2022-12-23 | Disposition: A | Discharge status: Home / Self Care | Payer: Medicare HMO | Source: Ambulatory Visit | Attending: Internal Medicine | Admitting: Internal Medicine

## 2022-12-23 ENCOUNTER — Ambulatory Visit: Payer: Medicare HMO | Admitting: Nurse Practitioner

## 2022-12-23 DIAGNOSIS — J9811 Atelectasis: Secondary | ICD-10-CM | POA: Diagnosis not present

## 2022-12-23 DIAGNOSIS — J449 Chronic obstructive pulmonary disease, unspecified: Secondary | ICD-10-CM | POA: Diagnosis not present

## 2022-12-23 DIAGNOSIS — J439 Emphysema, unspecified: Secondary | ICD-10-CM | POA: Diagnosis not present

## 2022-12-23 DIAGNOSIS — R079 Chest pain, unspecified: Secondary | ICD-10-CM | POA: Diagnosis not present

## 2022-12-23 DIAGNOSIS — J1289 Other viral pneumonia: Secondary | ICD-10-CM | POA: Insufficient documentation

## 2022-12-23 NOTE — Progress Notes (Deleted)
Cardiology Office Note:    Date:  12/23/2022   ID:  Dustin [name], DOB Dec 27, 1934, MRN [id number]  PCP:  [name], [place] Prov[facility] Cardiologist:  Rozann Lesches, MD { Click to update primary MD,subspecialty MD or APP then REFRESH:1}    Referring MD: [name], MD   No chief complaint on file. ***  History of Present Illness:    Dustin [name] is a 88 y.o. male with a hx of ***  CAD, s/p CABG in 1996 with hx of stenting HFmrEF LBBB HLD HTN Baseline Bradycardia Carotid artery disease Thyroid dx Dementia Atrophic gastrititis   Patient is a 88 year old male with past medical history as mentioned above.  Was admitted in August 2023 for chest pain/unstable angina.  Had low flat troponins, peaking at 35.  Repeat echocardiogram revealed EF was mildly reduced at 50 to 55%, had basal inferolateral segment and basal inferior segments being hypokinetic.  Medical therapy was recommended.  For his GDMT, it was found that he would not be a good candidate for SGLT2 inhibitor due to history of frequent UTIs.  Was not started on beta-blocker due to history of bradycardia.  Monitor in 2018 revealed lowest heart rate at 44 bpm.  Last seen in cardiology office for hospital follow-up with Melina Copa, PA-C.  Reported doing okay, chief complaint was worsening chronic dizziness.  CT of the head in 2022 showed probable old stroke and atrophy but no acute changes.  Dizziness was worse after starting spironolactone.  Most noticed with ambulation, improved after sitting down.  Wife noted that he was dizzy also with lying down as well.  He also noted some chest pain after discharge.  Dyspnea was unchanged, denied any edema.  Denied any true syncope.  3-day ZIO and carotid duplex were arranged.  Cardiac monitor revealed predominant sinus rhythm, average heart rate 58 bpm.  Brief occasional PACs, 1.1% of total beats, rare PVCs, 20 very brief episodes of PSVT, longest lasted  7 beats, no sustained atrial arrhythmias or no significant pauses noted.  Carotid duplex was overall unremarkable.  No medication changes were made.  Was told to follow-up with cardiology in 3 to 4 months.    Most recently he presented to ED on November 22, 2022 with chief complaint of chest pain and confusion.  Chest pain was noted to be substernal, left-sided with radiation to left shoulder.  Was occurring intermittently for the past few days.  Was using nitroglycerin more frequently.  Son stated that when he had increased pain, he tended to get more confused.  Also noted chronic cough, tan-colored sputum, patient denies shortness of breath.  EKG revealed sinus rhythm with left bundle branch block, initial troponin normal, follow-up was 18.  R/o for ACS. CT of the head was unremarkable.  Limited Echo revealed EF 60-65%, regional wall motion abnormalities noted of left ventricle, mildly dilated left ventricular internal cavity size with mild concentric left ventricular hypertrophy.  Mild MR with mild calcification aortic valve, no aortic stenosis noted, dilated IVC, right atrial pressure suggestive of 8 mmHg.  Similar wall motion abnormality was noted in previous echocardiogram.  Diastolic function cannot be assessed. Did receive some IV Lasix during hospitalization.  CT of the abdomen and pelvis were without acute findings.  Chest x-ray did not show pneumonia, showed atelectasis.  Was following SLP due to dysphagia and possible concerns for aspiration.  SLP recommended dysphagia 3/mechanically soft diet, thin liquids.  Physical therapy recommended home health PT.  Was discharged in stable condition on 11/24/2022.   Today presents for follow-up.  He states.  Past Medical History:  Diagnosis Date   Alzheimer disease ([place])    Anxiety    Arthritis    Atrophic gastritis    a. By EGD 02/2013.   Carotid artery disease (HCC)    a. mild-mod plaque, <50% stenosis bilat by duplex 2018.   Coronary  atherosclerosis of native coronary artery    a. Multivessel s/p CABG 1996. b. DESx2 to SVG-PDA in 2012 c. DES to distal LM in 11/2019 d. cath in 02/2021 showing patent LM stent, patent LIMA-LAD, patent SVG-OM1 with chronically occluded jump limb to OM2 and chronic occlusion of SVG-D2 and SVG-PDA and no targets for intervention   DDD (degenerative disc disease)    Chronic back pain   Dementia (HCC)    Enlarged prostate    Essential hypertension    Hematuria    Hypothyroidism    LBBB (left bundle branch block)    MI (myocardial infarction) ([place])    Mixed hyperlipidemia    OA (osteoarthritis)    OSA (obstructive sleep apnea)    Pneumonia due to COVID-19 virus    February 2021   Sinus bradycardia    a. Aricept and BB discontinued due to this.    Past Surgical History:  Procedure Laterality Date   CATARACT EXTRACTION W/PHACO Left 03/22/2022   Procedure: CATARACT EXTRACTION PHACO AND INTRAOCULAR LENS PLACEMENT (IOC);  Surgeon: Baruch Goldmann, MD;  Location: [facility];  Service: Ophthalmology;  Laterality: Left;  CDE: 25.22   COLONOSCOPY  08/03/2004   [place]kins-numerous large diverticula in the descending, transverse, descending, and sigmoid colon. Otherwise normal exam.   COLONOSCOPY  07/12/2012   RMR: External hemorrhoidal tag; multiple rectal and colonic polyps removed and/or treated as described above. Pancolonic diverticulosis. Bx-tubular adenomas, rectal hyperplastic polyp. next colonoscopy in 06/2015.   COLONOSCOPY N/A 06/22/2016   Procedure: COLONOSCOPY;  Surgeon: Aviva Signs, MD;  Location: [facility];  Service: Gastroenterology;  Laterality: N/A;  67   CORONARY ANGIOPLASTY WITH STENT PLACEMENT     CORONARY ARTERY BYPASS GRAFT  1996   LIMA to LAD, SVG to D2, SVG to PDA, SVG to OM1 and OM2   CORONARY STENT INTERVENTION N/A 12/10/2019   Procedure: CORONARY STENT INTERVENTION;  Surgeon: Burnell Blanks, MD;  Location: [facility];  Service: Cardiovascular;  Laterality: N/A;    ESOPHAGOGASTRODUODENOSCOPY N/A 03/16/2013   Procedure: ESOPHAGOGASTRODUODENOSCOPY (EGD);  Surgeon: Daneil Dolin, MD;  Location: [facility];  Service: Endoscopy;  Laterality: N/A;  12:00-moved to [place] notified pt   ESOPHAGOGASTRODUODENOSCOPY N/A 03/06/2013   Procedure: ESOPHAGOGASTRODUODENOSCOPY (EGD);  Surgeon: Daneil Dolin, MD;  Location: [facility];  Service: Endoscopy;  Laterality: N/A;   HERNIA REPAIR     INTRAVASCULAR ULTRASOUND/IVUS N/A 12/10/2019   Procedure: Intravascular Ultrasound/IVUS;  Surgeon: Burnell Blanks, MD;  Location: [facility];  Service: Cardiovascular;  Laterality: N/A;   LEFT HEART CATH AND CORS/GRAFTS ANGIOGRAPHY N/A 12/10/2019   Procedure: LEFT HEART CATH AND CORS/GRAFTS ANGIOGRAPHY;  Surgeon: Burnell Blanks, MD;  Location: [facility];  Service: Cardiovascular;  Laterality: N/A;   LEFT HEART CATH AND CORS/GRAFTS ANGIOGRAPHY N/A 03/02/2021   Procedure: LEFT HEART CATH AND CORS/GRAFTS ANGIOGRAPHY;  Surgeon: Nelva Bush, MD;  Location: [facility];  Service: Cardiovascular;  Laterality: N/A;    Current Medications: No outpatient medications have been marked as taking for the 12/23/22 encounter (  Appointment) with Finis Bud, NP.     Allergies:   Patient has no known allergies.   Social History   Socioeconomic History   Marital status: Married    Spouse name: Not on file   Number of children: 3   Years of education: Not on file   Highest education level: Not on file  Occupational History   Occupation: retired    Fish farm manager: RETIRED  Tobacco Use   Smoking status: Former    Packs/day: 2.00    Years: 40.00    Total pack years: 80.00    Types: Cigarettes    Quit date: 12/27/1994    Years since quitting: 28.0   Smokeless tobacco: Never  Vaping Use   Vaping Use: Never used  Substance and Sexual Activity   Alcohol use: No    Alcohol/week: 0.0 standard drinks of alcohol   Drug use: No   Sexual  activity: Not Currently  Other Topics Concern   Not on file  Social History Narrative   Lives w/ ailing wife.   Son brings meals 3x per week   Social Determinants of Health   Financial Resource Strain: Not on file  Food Insecurity: Not on file  Transportation Needs: Not on file  Physical Activity: Not on file  Stress: Not on file  Social Connections: Not on file     Family History: The patient's ***family history includes Heart attack in his mother; Heart disease in an other family member. There is no history of Colon cancer.  ROS:   Please see the history of present illness.    *** All other systems reviewed and are negative.  EKGs/Labs/Other Studies Reviewed:    The following studies were reviewed today: ***  EKG:  EKG is *** ordered today.  The ekg ordered today demonstrates ***  Recent Labs: 09/02/2022: TSH 0.982 11/23/2022: B Natriuretic Peptide 150.0 11/24/2022: ALT 13; BUN 20; Creatinine, Ser 1.13; Hemoglobin 9.9; Platelets 172; Potassium 3.5; Sodium 138  Recent Lipid Panel    Component Value Date/Time   CHOL 92 08/11/2022 0847   TRIG 130 08/11/2022 0847   HDL 33 (L) 08/11/2022 0847   CHOLHDL 2.8 08/11/2022 0847   VLDL 26 08/11/2022 0847   LDLCALC 33 08/11/2022 0847     Risk Assessment/Calculations:   {Does this patient have ATRIAL FIBRILLATION?:[phone]}  No BP recorded.  {Refresh Note OR Click here to enter BP  :1}***         Physical Exam:    VS:  There were no vitals taken for this visit.    Wt Readings from Last 3 Encounters:  11/24/22 209 lb 10.5 oz (95.1 kg)  09/02/22 207 lb (93.9 kg)  08/10/22 212 lb 15.4 oz (96.6 kg)     GEN: *** Well nourished, well developed in no acute distress HEENT: Normal NECK: No JVD; No carotid bruits LYMPHATICS: No lymphadenopathy CARDIAC: ***RRR, no murmurs, rubs, gallops RESPIRATORY:  Clear to auscultation without rales, wheezing or rhonchi  ABDOMEN: Soft, non-tender, non-distended MUSCULOSKELETAL:  No  edema; No deformity  SKIN: Warm and dry NEUROLOGIC:  Alert and oriented x 3 PSYCHIATRIC:  Normal affect   ASSESSMENT:    No diagnosis found. PLAN:    In order of problems listed above:  ***      {Are you ordering a CV Procedure (e.g. stress test, cath, DCCV, TEE, etc)?   Press F2        :[id number]}    Medication Adjustments/Labs and Tests Ordered: Current medicines are reviewed  at length with the patient today.  Concerns regarding medicines are outlined above.  No orders of the defined types were placed in this encounter.  No orders of the defined types were placed in this encounter.   There are no Patient Instructions on file for this visit.   Signed, Finis Bud, NP  12/23/2022 1:04 PM    [facility]

## 2022-12-24 ENCOUNTER — Other Ambulatory Visit (HOSPITAL_COMMUNITY): Payer: Self-pay | Admitting: Internal Medicine

## 2022-12-24 DIAGNOSIS — R911 Solitary pulmonary nodule: Secondary | ICD-10-CM

## 2022-12-28 ENCOUNTER — Encounter: Payer: Self-pay | Admitting: Nurse Practitioner

## 2022-12-28 ENCOUNTER — Ambulatory Visit (HOSPITAL_COMMUNITY)
Admission: RE | Admit: 2022-12-28 | Discharge: 2022-12-28 | Disposition: A | Discharge status: Home / Self Care | Payer: Medicare HMO | Source: Ambulatory Visit | Attending: Internal Medicine | Admitting: Internal Medicine

## 2022-12-28 DIAGNOSIS — R918 Other nonspecific abnormal finding of lung field: Secondary | ICD-10-CM | POA: Diagnosis not present

## 2022-12-28 DIAGNOSIS — R911 Solitary pulmonary nodule: Secondary | ICD-10-CM | POA: Diagnosis not present

## 2022-12-28 DIAGNOSIS — J439 Emphysema, unspecified: Secondary | ICD-10-CM | POA: Diagnosis not present

## 2022-12-28 MED ORDER — IOHEXOL 300 MG/ML  SOLN
75.0000 mL | Freq: Once | INTRAMUSCULAR | Status: AC | PRN
  Administered 2022-12-28: 75 mL via INTRAVENOUS

## 2023-01-06 DIAGNOSIS — Z0001 Encounter for general adult medical examination with abnormal findings: Secondary | ICD-10-CM | POA: Diagnosis not present

## 2023-01-06 DIAGNOSIS — F0280 Dementia in other diseases classified elsewhere without behavioral disturbance: Secondary | ICD-10-CM | POA: Diagnosis not present

## 2023-01-06 DIAGNOSIS — R911 Solitary pulmonary nodule: Secondary | ICD-10-CM | POA: Diagnosis not present

## 2023-01-06 DIAGNOSIS — F0390 Unspecified dementia without behavioral disturbance: Secondary | ICD-10-CM | POA: Diagnosis not present

## 2023-01-06 DIAGNOSIS — I5022 Chronic systolic (congestive) heart failure: Secondary | ICD-10-CM | POA: Diagnosis not present

## 2023-01-06 DIAGNOSIS — I209 Angina pectoris, unspecified: Secondary | ICD-10-CM | POA: Diagnosis not present

## 2023-01-06 DIAGNOSIS — G894 Chronic pain syndrome: Secondary | ICD-10-CM | POA: Diagnosis not present

## 2023-01-06 DIAGNOSIS — J441 Chronic obstructive pulmonary disease with (acute) exacerbation: Secondary | ICD-10-CM | POA: Diagnosis not present

## 2023-01-06 DIAGNOSIS — G309 Alzheimer's disease, unspecified: Secondary | ICD-10-CM | POA: Diagnosis not present

## 2023-02-04 DIAGNOSIS — I209 Angina pectoris, unspecified: Secondary | ICD-10-CM | POA: Diagnosis not present

## 2023-02-04 DIAGNOSIS — I5022 Chronic systolic (congestive) heart failure: Secondary | ICD-10-CM | POA: Diagnosis not present

## 2023-02-04 DIAGNOSIS — G309 Alzheimer's disease, unspecified: Secondary | ICD-10-CM | POA: Diagnosis not present

## 2023-02-04 DIAGNOSIS — J449 Chronic obstructive pulmonary disease, unspecified: Secondary | ICD-10-CM | POA: Diagnosis not present

## 2023-02-04 DIAGNOSIS — F0280 Dementia in other diseases classified elsewhere without behavioral disturbance: Secondary | ICD-10-CM | POA: Diagnosis not present

## 2023-02-04 DIAGNOSIS — F514 Sleep terrors [night terrors]: Secondary | ICD-10-CM | POA: Diagnosis not present

## 2023-02-04 DIAGNOSIS — F0390 Unspecified dementia without behavioral disturbance: Secondary | ICD-10-CM | POA: Diagnosis not present

## 2023-02-04 DIAGNOSIS — E6609 Other obesity due to excess calories: Secondary | ICD-10-CM | POA: Diagnosis not present

## 2023-02-04 DIAGNOSIS — Z6830 Body mass index (BMI) 30.0-30.9, adult: Secondary | ICD-10-CM | POA: Diagnosis not present

## 2023-02-14 ENCOUNTER — Encounter: Payer: Self-pay | Admitting: Nurse Practitioner

## 2023-02-14 ENCOUNTER — Ambulatory Visit: Payer: Medicare HMO | Attending: Nurse Practitioner | Admitting: Nurse Practitioner

## 2023-02-14 VITALS — BP 128/60 | HR 74 | Ht 70.0 in | Wt 216.0 lb

## 2023-02-14 DIAGNOSIS — E785 Hyperlipidemia, unspecified: Secondary | ICD-10-CM | POA: Diagnosis not present

## 2023-02-14 DIAGNOSIS — I25119 Atherosclerotic heart disease of native coronary artery with unspecified angina pectoris: Secondary | ICD-10-CM

## 2023-02-14 DIAGNOSIS — I10 Essential (primary) hypertension: Secondary | ICD-10-CM | POA: Diagnosis not present

## 2023-02-14 DIAGNOSIS — I779 Disorder of arteries and arterioles, unspecified: Secondary | ICD-10-CM

## 2023-02-14 DIAGNOSIS — I5032 Chronic diastolic (congestive) heart failure: Secondary | ICD-10-CM

## 2023-02-14 DIAGNOSIS — I5022 Chronic systolic (congestive) heart failure: Secondary | ICD-10-CM | POA: Diagnosis not present

## 2023-02-14 DIAGNOSIS — F0390 Unspecified dementia without behavioral disturbance: Secondary | ICD-10-CM

## 2023-02-14 MED ORDER — ASPIRIN 81 MG PO TBEC: 81.0000 mg | DELAYED_RELEASE_TABLET | Freq: Every day | ORAL | 3 refills | Status: DC

## 2023-02-14 MED ORDER — PANTOPRAZOLE SODIUM 40 MG PO TBEC: DELAYED_RELEASE_TABLET | ORAL | 0 refills | Status: DC

## 2023-02-14 NOTE — Patient Instructions (Signed)
Medication Instructions:  Your physician has recommended you make the following change in your medication:   Start Aspirin 81 mg Daily  Start Protonix 40 mg Two Times Daily for Two Weeks, Then Decrease to One Time Daily   Please go to the pharmacy and verify pill pack with the pharmacist.    *If you need a refill on your cardiac medications before your next appointment, please call your pharmacy*   Lab Work: NONE   If you have labs (blood work) drawn today and your tests are completely normal, you will receive your results only by: [facility] (if you have MyChart) OR A paper copy in the mail If you have any lab test that is abnormal or we need to change your treatment, we will call you to review the results.   Testing/Procedures: NONE    Follow-Up: At [facility], you and your health needs are our priority.  As part of our continuing mission to provide you with exceptional heart care, we have created designated Provider Care Teams.  These Care Teams include your primary Cardiologist (physician) and Advanced Practice Providers (APPs -  Physician Assistants and Nurse Practitioners) who all work together to provide you with the care you need, when you need it.  We recommend signing up for the patient portal called "MyChart".  Sign up information is provided on this After Visit Summary.  MyChart is used to connect with patients for Virtual Visits (Telemedicine).  Patients are able to view lab/test results, encounter notes, upcoming appointments, etc.  Non-urgent messages can be sent to your provider as well.   To learn more about what you can do with MyChart, go to [email or web address].    Your next appointment:   4 -6 week(s)  Provider:   Finis Bud, NP    Other Instructions Thank you for choosing [facility]!

## 2023-02-14 NOTE — Progress Notes (Unsigned)
Cardiology Office Note:    Date:  02/14/2023  ID:  Dustin [name], DOB 01-02-1934, MRN [id number]  PCP:  [name], [place] Providers Cardiologist:  Rozann Lesches, MD     Referring MD: [name], MD   CC: Here for follow-up  History of Present Illness:    Dustin [name] is a 89 y.o. male with a hx of the following:   CAD HFmrEF LBBB Hyperlipidemia Hypertension Baseline bradycardia Carotid artery disease Thyroid disease History of atrophic gastritis Dementia  Patient is a 89 year old male with past medical history as mentioned above.  Previous cardiovascular history includes CABG in 1996, in 2012 he underwent drug-eluting stent x 2 to SVG-PDA.  Received drug-eluting stent to distal LM in 2020.  Cardiac catheterization 2022 revealed patent LM stent, patent LIMA-LAD, patent SVG-OM1.  Jump limb to OM 2 was chronically occluded.  Chronic occlusion noted to SVG-D2 and SVG-RPDA, left ventricular ejection fraction is mildly reduced with global hypokinesis, EF 45 to 50%.  GDMT limited due to baseline bradycardia and unable to initiate SGLT2 inhibitor given history of frequent UTIs.  Last seen by Melina Copa, PA-C on September 02, 2022.  Was doing okay, did report worsening chronic dizziness.  Patient noted this seemed to be worse after starting spironolactone.  Denied any true syncope.  3-day ZIO monitor was arranged as well as carotid duplex.  Spironolactone was stopped due to worsening dizziness.  Monitor was negative for any significant bradycardia, rare PVCs, occasional PVCs, brief episodes of PSVT noted, no sustained atrial arrhythmias and no pauses.  Carotid Doppler was negative for any hemodynamically significant stenosis.  Echocardiogram in November 2023 revealed EF 60 to 65%, regional wall motion abnormalities of left ventricle noted, mildly dilated left ventricular internal cavity size, mild LVH, indeterminate left ventricular diastolic function,  mild MR, mildly calcified aortic valve, dilated IVC. Was told to follow-up in 3 to 4 months.  Today he presents for follow-up. Presents with reports of chest tightness, typically after a big meal, random and not associated with exertion, not bothersome per his report. Has been ongoing x 1 year, located along left neck/shoulder. Nitroglycerin does help, 5/10 in intensity and lasts 30-35 minutes in duration. Wondering if his hernia is contributing to his symptoms. Denies any other associated symptoms. Denies any shortness of breath, palpitations, syncope, presyncope, dizziness, orthopnea, PND, swelling or significant weight changes, acute bleeding, or claudication.  Past Medical History:  Diagnosis Date   Alzheimer disease ([place])    Anxiety    Arthritis    Atrophic gastritis    a. By EGD 02/2013.   Carotid artery disease (HCC)    a. mild-mod plaque, <50% stenosis bilat by duplex 2018.   Coronary atherosclerosis of native coronary artery    a. Multivessel s/p CABG 1996. b. DESx2 to SVG-PDA in 2012 c. DES to distal LM in 11/2019 d. cath in 02/2021 showing patent LM stent, patent LIMA-LAD, patent SVG-OM1 with chronically occluded jump limb to OM2 and chronic occlusion of SVG-D2 and SVG-PDA and no targets for intervention   DDD (degenerative disc disease)    Chronic back pain   Dementia (HCC)    Enlarged prostate    Essential hypertension    Hematuria    Hypothyroidism    LBBB (left bundle branch block)    MI (myocardial infarction) ([place])    Mixed hyperlipidemia    OA (osteoarthritis)    OSA (obstructive sleep apnea)    Pneumonia due to COVID-19 virus  February 2021   Sinus bradycardia    a. Aricept and BB discontinued due to this.    Past Surgical History:  Procedure Laterality Date   CATARACT EXTRACTION W/PHACO Left 03/22/2022   Procedure: CATARACT EXTRACTION PHACO AND INTRAOCULAR LENS PLACEMENT (IOC);  Surgeon: Baruch Goldmann, MD;  Location: [facility];  Service: Ophthalmology;   Laterality: Left;  CDE: 25.22   COLONOSCOPY  08/03/2004   Jenkins-numerous large diverticula in the descending, transverse, descending, and sigmoid colon. Otherwise normal exam.   COLONOSCOPY  07/12/2012   RMR: External hemorrhoidal tag; multiple rectal and colonic polyps removed and/or treated as described above. Pancolonic diverticulosis. Bx-tubular adenomas, rectal hyperplastic polyp. next colonoscopy in 06/2015.   COLONOSCOPY N/A 06/22/2016   Procedure: COLONOSCOPY;  Surgeon: Aviva Signs, MD;  Location: [facility];  Service: Gastroenterology;  Laterality: N/A;  24   CORONARY ANGIOPLASTY WITH STENT PLACEMENT     CORONARY ARTERY BYPASS GRAFT  1996   LIMA to LAD, SVG to D2, SVG to PDA, SVG to OM1 and OM2   CORONARY STENT INTERVENTION N/A 12/10/2019   Procedure: CORONARY STENT INTERVENTION;  Surgeon: Burnell Blanks, MD;  Location: [place] [facility];  Service: Cardiovascular;  Laterality: N/A;   ESOPHAGOGASTRODUODENOSCOPY N/A 03/16/2013   Procedure: ESOPHAGOGASTRODUODENOSCOPY (EGD);  Surgeon: Daneil Dolin, MD;  Location: [facility];  Service: Endoscopy;  Laterality: N/A;  12:00-moved to [place] notified pt   ESOPHAGOGASTRODUODENOSCOPY N/A 03/06/2013   Procedure: ESOPHAGOGASTRODUODENOSCOPY (EGD);  Surgeon: Daneil Dolin, MD;  Location: [facility];  Service: Endoscopy;  Laterality: N/A;   HERNIA REPAIR     INTRAVASCULAR ULTRASOUND/IVUS N/A 12/10/2019   Procedure: Intravascular Ultrasound/IVUS;  Surgeon: Burnell Blanks, MD;  Location: [place] [facility];  Service: Cardiovascular;  Laterality: N/A;   LEFT HEART CATH AND CORS/GRAFTS ANGIOGRAPHY N/A 12/10/2019   Procedure: LEFT HEART CATH AND CORS/GRAFTS ANGIOGRAPHY;  Surgeon: Burnell Blanks, MD;  Location: [place] [facility];  Service: Cardiovascular;  Laterality: N/A;   LEFT HEART CATH AND CORS/GRAFTS ANGIOGRAPHY N/A 03/02/2021   Procedure: LEFT HEART CATH AND CORS/GRAFTS ANGIOGRAPHY;  Surgeon: Nelva Bush, MD;  Location: [place] [facility];  Service: Cardiovascular;  Laterality: N/A;    Current Medications: Current Meds  Medication Sig   albuterol (PROVENTIL) (2.5 MG/3ML) 0.083% nebulizer solution Take 3 mLs (2.5 mg total) by nebulization every 2 (two) hours as needed for wheezing or shortness of breath.   albuterol (VENTOLIN HFA) 108 (90 Base) MCG/ACT inhaler Inhale 2 puffs into the lungs every 6 (six) hours as needed for wheezing or shortness of breath.   alfuzosin (UROXATRAL) 10 MG 24 hr tablet Take 1 tablet (10 mg total) by mouth daily with breakfast.   Budeson-Glycopyrrol-Formoterol (BREZTRI AEROSPHERE) 160-9-4.8 MCG/ACT AERO Inhale 2 puffs into the lungs in the morning and at bedtime.   clopidogrel (PLAVIX) 75 MG tablet Take 1 tablet (75 mg total) by mouth daily.   dutasteride (AVODART) 0.5 MG capsule Take 1 capsule (0.5 mg total) by mouth daily. In the afternoon   escitalopram (LEXAPRO) 20 MG tablet Take 20 mg by mouth every morning.    furosemide (LASIX) 20 MG tablet Take 1 tablet (20 mg total) by mouth 2 (two) times daily. For fluid   guaiFENesin (MUCINEX) 600 MG 12 hr tablet Take 1 tablet (600 mg total) by mouth 2 (two) times daily.   isosorbide mononitrate (IMDUR) 120 MG 24 hr tablet Take 1 tablet (120 mg total) by mouth every morning.  levothyroxine (SYNTHROID) 75 MCG tablet Take 75 mcg by mouth daily.   memantine (NAMENDA) 10 MG tablet Take 10 mg by mouth 2 (two) times daily.    nitroGLYCERIN (NITROSTAT) 0.4 MG SL tablet Place 1 tablet (0.4 mg total) under the tongue every 5 (five) minutes as needed for chest pain.   oxycodone (ROXICODONE) 30 MG immediate release tablet Take 30 mg by mouth every 6 (six) hours as needed for pain.   potassium chloride SA (KLOR-CON M) 20 MEQ tablet Take 1 tablet (20 mEq total) by mouth daily.   QUEtiapine (SEROQUEL) 25 MG tablet Take 25 mg by mouth at bedtime as needed (sleep).   ranolazine (RANEXA) 1000 MG SR tablet Take 1 tablet (1,000  mg total) by mouth 2 (two) times daily.   rosuvastatin (CRESTOR) 40 MG tablet Take 1 tablet (40 mg total) by mouth daily at 6 PM.     Allergies:   Patient has no known allergies.   Social History   Socioeconomic History   Marital status: Married    Spouse name: Not on file   Number of children: 3   Years of education: Not on file   Highest education level: Not on file  Occupational History   Occupation: retired    Fish farm manager: RETIRED  Tobacco Use   Smoking status: Former    Packs/day: 2.00    Years: 40.00    Total pack years: 80.00    Types: Cigarettes    Quit date: 12/27/1994    Years since quitting: 28.1   Smokeless tobacco: Never  Vaping Use   Vaping Use: Never used  Substance and Sexual Activity   Alcohol use: No    Alcohol/week: 0.0 standard drinks of alcohol   Drug use: No   Sexual activity: Not Currently  Other Topics Concern   Not on file  Social History Narrative   Lives w/ ailing wife.   Son brings meals 3x per week   Social Determinants of Health   Financial Resource Strain: Not on file  Food Insecurity: Not on file  Transportation Needs: Not on file  Physical Activity: Not on file  Stress: Not on file  Social Connections: Not on file     Family History: The patient's family history includes Heart attack in his mother; Heart disease in an other family member. There is no history of Colon cancer.  ROS:   Please see the history of present illness.     All other systems reviewed and are negative.  EKGs/Labs/Other Studies Reviewed:    The following studies were reviewed today:   EKG:  EKG reviewed dated 11/22/2022 that demonstrated NSR with LBBB, no acute ischemic changes.   Limited Echo on 11/23/2022:  1. Left ventricular ejection fraction, by estimation, is 60 to 65%. The  left ventricle has normal function. The left ventricle demonstrates  regional wall motion abnormalities (see scoring diagram/findings for  description). The left ventricular  internal  cavity size was mildly dilated. There is mild concentric left ventricular  hypertrophy. Left ventricular diastolic function could not be evaluated.   2. Right ventricular systolic function is normal. The right ventricular  size is normal. Tricuspid regurgitation signal is inadequate for assessing  PA pressure.   3. The mitral valve is degenerative. Mild mitral valve regurgitation. The  mean mitral valve gradient is 2.0 mmHg.   4. The aortic valve is tricuspid. There is mild calcification of the  aortic valve. Aortic valve regurgitation is not visualized. Aortic valve  sclerosis/calcification is  present, without any evidence of aortic  stenosis.   5. The inferior vena cava is dilated in size with >50% respiratory  variability, suggesting right atrial pressure of 8 mmHg.   Comparison(s): Prior images reviewed side by side. LVEF normal range at  60-65% with similar wall motion abnormality. Diastolic function not  assessed.  Cardiac monitor on 09/15/2022: Predominant rhythm is sinus with heart rate ranging from 46 bpm up to 77 bpm and average heart rate 58 bpm. There were occasional PACs representing 1.1% total beats with otherwise rare atrial couplets and triplets. There were rare PVCs including ventricular couplets representing less than 1% total beats. Twenty very brief episodes of PSVT were noted, the longest of which lasted only 7 beats.  There were no sustained atrial arrhythmias. No pauses.  Carotid doppler on 09/13/2022: IMPRESSION: Color duplex indicates moderate heterogeneous and calcified plaque, with no hemodynamically significant stenosis by duplex criteria in the extracranial cerebrovascular circulation.   Left heart cath on 03/02/2021:  Conclusions: Severe native coronary artery disease. Including 99% mid LAD stenosis with competitive flow from LIMA-LAD, as well as chronic total occlusions of proximal LCx and mid RCA. Widely patent LMCA stent. Widely patent  LIMA-LAD. Patent SVG-OM1 with minimal liminal irregularities.  Jump limb to OM2 is chronically occluded. Chronically occlused SVG-D2 and SVG-rPDA. Mildly reduced left ventricular contraction with global hypokinesis (LVEF 45-50%). Moderately elevated left ventricular filling pressure (LVEDP 25-30 mmHg).   Recommendations: Escalate furosemide to 40 mg daily. Consider further escalation of antianginal therapy.  No interventional targets identified on today's catheterization. Consider echo to reassess LVEF and exclude other structural abnormalities that could be contributing to symptoms. Continue aggressive secondary prevention of coronary artery disease.   Left heart cath on 12/10/2019:  Origin lesion is 100% stenosed. Mid Graft to Dist Graft lesion is 100% stenosed. Mid RCA lesion is 100% stenosed. Prox Graft lesion is 100% stenosed. Mid LAD lesion is 99% stenosed. Prox Cx to Mid Cx lesion is 100% stenosed. SVG graft was visualized by angiography. SVG graft was visualized by angiography. Origin to Prox Graft lesion is 100% stenosed. SVG graft was visualized by angiography. LIMA graft was visualized by angiography. Ost LM to Mid LM lesion is 90% stenosed. A drug-eluting stent was successfully placed using a STENT RESOLUTE ONYX 4.5X18. Post intervention, there is a 0% residual stenosis.   1. Severe triple vessel CAD s/p 5V CABG with 2/5 patent bypass grafts.  2. The left main artery has a moderate ostial stenosis and a severe mid stenosis.  3. The LAD has severe proximal to mid stenosis beyond the first large caliber diagonal branch. The LIMA to the LAD is patent. The vein graft to the second diagonal branch is occluded. The second diagonal branch fills from flow through the LIMA to the LAD. The first diagonal branch is not protected by the LIMA graft.  4. The ramus intermediate branch is a moderate caliber vessel and is not protected by grafts.  5. The Circumflex is a moderate caliber  vessel with chronic mid occlusion. The sequential vein graft to OM1 and OM2 is patent to OM1 but the sequential segment of the graft to OM2 is occluded. OM2 fills from left to left collaterals.  6. The RCA is a large caliber vessel with chronic mid occlusion. The vein graft to the PDA is now occluded. The distal RCA and it's branches fill from left to right collaterals supplied through the LIMA to the LAD.  7. Successful PTCA/DES x 1 ostial to  to distal left main artery.    Recommendations: Continue ASA and Plavix for lifetime given the left main stenting. Continue statin and Imdur. Anticipate discharge home tomorrow.   Recent Labs: 09/02/2022: TSH 0.982 11/23/2022: B Natriuretic Peptide 150.0 11/24/2022: ALT 13; BUN 20; Creatinine, Ser 1.13; Hemoglobin 9.9; Platelets 172; Potassium 3.5; Sodium 138  Recent Lipid Panel    Component Value Date/Time   CHOL 92 08/11/2022 0847   TRIG 130 08/11/2022 0847   HDL 33 (L) 08/11/2022 0847   CHOLHDL 2.8 08/11/2022 0847   VLDL 26 08/11/2022 0847   LDLCALC 33 08/11/2022 0847    Physical Exam:    VS:  BP 128/60   Pulse 74   Ht 5' 10"$  (1.778 m)   Wt 216 lb (98 kg)   SpO2 93%   BMI 30.99 kg/m     Wt Readings from Last 3 Encounters:  02/14/23 216 lb (98 kg)  11/24/22 209 lb 10.5 oz (95.1 kg)  09/02/22 207 lb (93.9 kg)     GEN: Obese, 89 y.o. male in no acute distress HEENT: Normal NECK: No JVD; No carotid bruits CARDIAC: S1/S2, RRR, no murmurs, rubs, gallops; 2+ pulses RESPIRATORY:  Clear to auscultation without rales, wheezing or rhonchi  MUSCULOSKELETAL:  No edema; No deformity  SKIN: Warm and dry NEUROLOGIC:  Alert and oriented x 3, occasionally forgetful PSYCHIATRIC:  Normal affect   ASSESSMENT:    1. Coronary artery disease involving native heart with angina pectoris, unspecified vessel or lesion type ([place])   2. Heart failure with improved ejection fraction (HFimpEF) ([place])   3. Hyperlipidemia, unspecified hyperlipidemia type   4.  Essential hypertension, benign   5. Carotid artery disease, unspecified laterality, unspecified type ([place])   6. Dementia, unspecified dementia severity, unspecified dementia type, unspecified whether behavioral, psychotic, or mood disturbance or anxiety ([place])    PLAN:    In order of problems listed above:  CAD Etiology unclear, but does sound atypical in presentation. Some aspects of his report sound GI related. Will start Protonix 40 mg BID x 2 weeks, then reduce to 40 mg daily to r/o GI causes. Will start Aspirin 81 mg daily with his hx and continue Plavix, Imdur, NG PRN, Ranexa, and rosuvastatin. Cardiac catheterization 2022 revealed patent LM stent, patent LIMA-LAD, patent SVG-OM1.  Jump limb to OM 2 was chronically occluded.  Chronic occlusion noted to SVG-D2 and SVG-RPDA, left ventricular ejection fraction is mildly reduced with global hypokinesis. Currently I feel like pt is not a candidate for repeat left heart cath given age and dementia hx. Prefer to medically manage at this time. Heart healthy diet and regular cardiovascular exercise encouraged. ED precautions discussed.   HFimpEF Limited Echo showed resolved EF to normal, 60-65% 10/2022. Euvolemic and well compensated on exam. Not a candidate for SGLT2i d/t hx of frequent UTI's. Continue current medication regimen. Low sodium diet, fluid restriction <2L, and daily weights encouraged. Educated to contact our office for weight gain of 2 lbs overnight or 5 lbs in one week.  HLD Lipid panel showed LDL 33 07/2022. Continue rosuvastatin. Heart healthy diet and regular cardiovascular exercise encouraged.   HTN BP stable. Discussed to monitor BP at home at least 2 hours after medications and sitting for 5-10 minutes. No medication changes at this time. Heart healthy diet and regular cardiovascular exercise encouraged.   Carotid artery disease Doppler 08/2022 showed no hemodynamically significant stenosis noted. No medication changes. Adding  Aspirin as mentioned above. Heart healthy diet and regular cardiovascular  exercise encouraged.   Dementia Continue medication regimen. Continue to follow with PCP.  7. Disposition: Follow-up with me or APP in 4-6 weeks or sooner if anything changes.   Medication Adjustments/Labs and Tests Ordered: Current medicines are reviewed at length with the patient today.  Concerns regarding medicines are outlined above.  No orders of the defined types were placed in this encounter.  Meds ordered this encounter  Medications   aspirin EC 81 MG tablet    Sig: Take 1 tablet (81 mg total) by mouth daily. Swallow whole.    Dispense:  90 tablet    Refill:  3   pantoprazole (PROTONIX) 40 MG tablet    Sig: Take 1 tablet (40 mg total) by mouth 2 (two) times daily for 14 days, THEN 1 tablet (40 mg total) daily.    Dispense:  388 tablet    Refill:  0    Patient Instructions  Medication Instructions:  Your physician has recommended you make the following change in your medication:   Start Aspirin 81 mg Daily  Start Protonix 40 mg Two Times Daily for Two Weeks, Then Decrease to One Time Daily   Please go to the pharmacy and verify pill pack with the pharmacist.    *If you need a refill on your cardiac medications before your next appointment, please call your pharmacy*   Lab Work: NONE   If you have labs (blood work) drawn today and your tests are completely normal, you will receive your results only by: [place] (if you have MyChart) OR A paper copy in the mail If you have any lab test that is abnormal or we need to change your treatment, we will call you to review the results.   Testing/Procedures: NONE    Follow-Up: At [facility], you and your health needs are our priority.  As part of our continuing mission to provide you with exceptional heart care, we have created designated Provider Care Teams.  These Care Teams include your primary Cardiologist (physician) and  Advanced Practice Providers (APPs -  Physician Assistants and Nurse Practitioners) who all work together to provide you with the care you need, when you need it.  We recommend signing up for the patient portal called "MyChart".  Sign up information is provided on this After Visit Summary.  MyChart is used to connect with patients for Virtual Visits (Telemedicine).  Patients are able to view lab/test results, encounter notes, upcoming appointments, etc.  Non-urgent messages can be sent to your provider as well.   To learn more about what you can do with MyChart, go to [email or web address].    Your next appointment:   4 -6 week(s)  Provider:   Finis Bud, NP    Other Instructions Thank you for choosing [facility]!      SignedFinis Bud, NP  02/16/2023 12:05 PM    [place]

## 2023-03-04 DIAGNOSIS — I5032 Chronic diastolic (congestive) heart failure: Secondary | ICD-10-CM | POA: Diagnosis not present

## 2023-03-04 DIAGNOSIS — J449 Chronic obstructive pulmonary disease, unspecified: Secondary | ICD-10-CM | POA: Diagnosis not present

## 2023-03-04 DIAGNOSIS — G309 Alzheimer's disease, unspecified: Secondary | ICD-10-CM | POA: Diagnosis not present

## 2023-03-04 DIAGNOSIS — I209 Angina pectoris, unspecified: Secondary | ICD-10-CM | POA: Diagnosis not present

## 2023-03-04 DIAGNOSIS — F0280 Dementia in other diseases classified elsewhere without behavioral disturbance: Secondary | ICD-10-CM | POA: Diagnosis not present

## 2023-03-04 DIAGNOSIS — G894 Chronic pain syndrome: Secondary | ICD-10-CM | POA: Diagnosis not present

## 2023-03-04 DIAGNOSIS — M48061 Spinal stenosis, lumbar region without neurogenic claudication: Secondary | ICD-10-CM | POA: Diagnosis not present

## 2023-03-04 DIAGNOSIS — I5022 Chronic systolic (congestive) heart failure: Secondary | ICD-10-CM | POA: Diagnosis not present

## 2023-03-04 DIAGNOSIS — F0390 Unspecified dementia without behavioral disturbance: Secondary | ICD-10-CM | POA: Diagnosis not present

## 2023-03-09 ENCOUNTER — Ambulatory Visit: Payer: Medicare HMO | Admitting: Urology

## 2023-03-09 DIAGNOSIS — N401 Enlarged prostate with lower urinary tract symptoms: Secondary | ICD-10-CM

## 2023-03-14 ENCOUNTER — Ambulatory Visit: Payer: Medicare HMO | Admitting: Nurse Practitioner

## 2023-03-15 ENCOUNTER — Ambulatory Visit: Payer: Medicare HMO | Admitting: Pulmonary Disease

## 2023-03-16 ENCOUNTER — Ambulatory Visit: Payer: Medicare HMO | Admitting: Pulmonary Disease

## 2023-03-16 ENCOUNTER — Encounter: Payer: Self-pay | Admitting: Pulmonary Disease

## 2023-03-16 VITALS — BP 138/86 | HR 68 | Ht 70.0 in | Wt 213.2 lb

## 2023-03-16 DIAGNOSIS — R911 Solitary pulmonary nodule: Secondary | ICD-10-CM

## 2023-03-16 DIAGNOSIS — J4489 Other specified chronic obstructive pulmonary disease: Secondary | ICD-10-CM | POA: Diagnosis not present

## 2023-03-16 MED ORDER — BREZTRI AEROSPHERE 160-9-4.8 MCG/ACT IN AERO: 2.0000 | INHALATION_SPRAY | Freq: Two times a day (BID) | RESPIRATORY_TRACT | 5 refills | Status: DC

## 2023-03-16 NOTE — Patient Instructions (Signed)
Will arrange for PET scan at [facility]  Follow up in 4 weeks

## 2023-03-16 NOTE — Progress Notes (Signed)
Dustin [name], Critical Care, and Sleep Medicine  Chief Complaint  Patient presents with   Follow-up    Breathing is not doing well.      Constitutional:  BP 138/86   Pulse 68   Ht 5\' 10"  (1.778 m)   Wt 213 lb 3.2 oz (96.7 kg)   SpO2 93% Comment: ra  BMI 30.59 kg/m   Past Medical History:  Alzheimer's disease, Anxiety, Gastritis, CAD, DDD, BPH, HTN, Hypothyroidism, LBBB, HLD, COVID 19 PNA February 2021, PNA September 2015 and MAy 2022  Past Surgical History:  He  has a past surgical history that includes Coronary artery bypass graft (1996); Hernia repair; Coronary angioplasty with stent; Colonoscopy (08/03/2004); Colonoscopy (07/12/2012); Esophagogastroduodenoscopy (N/A, 03/16/2013); Esophagogastroduodenoscopy (N/A, 03/06/2013); Colonoscopy (N/A, 06/22/2016); LEFT HEART CATH AND CORS/GRAFTS ANGIOGRAPHY (N/A, 12/10/2019); CORONARY STENT INTERVENTION (N/A, 12/10/2019); Intravascular Ultrasound/IVUS (N/A, 12/10/2019); LEFT HEART CATH AND CORS/GRAFTS ANGIOGRAPHY (N/A, 03/02/2021); and Cataract extraction w/PHACO (Left, 03/22/2022).  Brief Summary:  Dustin [name] is a 89 y.o. male former smoker with COPD and lung nodule.      Subjective:   He is here with his wife.  He fell in November.  Had a chest xray that showed a nodule in Lt lung.  This was confirmed on CT imaging from January.  He still feels sore on his right side at times.  Not having as much cough, wheeze or sputum.  Uses mucinex intermittently.  Breztri helps.  Uses albuterol daily.  Physical Exam:   Appearance - well kempt   ENMT - no sinus tenderness, no oral exudate, no LAN, Mallampati 2 airway, no stridor, decreased hearing acuity  Respiratory - equal breath sounds bilaterally, no wheezing or rales  CV - s1s2 regular rate and rhythm, no murmurs  Ext - no clubbing, no edema  Skin - no rashes  Psych - normal mood and affect     [name] testing:    Chest Imaging:  CT angio chest 09/21/20 >>  atherosclerosis CT chest 12/29/22 >> coronary calcification, mild emphysema, 1.6 x 1.5 x 0.7 cm nodule LLL  Cardiac Tests:  Echo 11/23/22 >> EF 60 to 65%, mild LVH, mild MR  Social History:  He  reports that he quit smoking about 28 years ago. His smoking use included cigarettes. He has a 80.00 pack-year smoking history. He has never used smokeless tobacco. He reports that he does not drink alcohol and does not use drugs.  Family History:  His family history includes Heart attack in his mother; Heart disease in an other family member.     Assessment/Plan:   Lt lower lobe lung nodule. - reviewed imaging results - they would like to get more information before deciding about additional interventions - will arrange for PET scan  COPD. - breztri two puffs bid - prn abluterol - he has a nebulizer - prn mucinex - defer PFT for now since results not likely to change management at this point  Time Spent Involved in Patient Care on Day of Examination:  35 minutes  Follow up:   There are no Patient Instructions on file for this visit.  Medication List:   Allergies as of 03/16/2023   No Known Allergies      Medication List        Accurate as of March 16, 2023 11:18 AM. If you have any questions, ask your nurse or doctor.          albuterol (2.5 MG/3ML) 0.083% nebulizer solution Commonly known as: PROVENTIL Take  3 mLs (2.5 mg total) by nebulization every 2 (two) hours as needed for wheezing or shortness of breath.   albuterol 108 (90 Base) MCG/ACT inhaler Commonly known as: VENTOLIN HFA Inhale 2 puffs into the lungs every 6 (six) hours as needed for wheezing or shortness of breath.   alfuzosin 10 MG 24 hr tablet Commonly known as: UROXATRAL Take 1 tablet (10 mg total) by mouth daily with breakfast.   aspirin EC 81 MG tablet Take 1 tablet (81 mg total) by mouth daily. Swallow whole.   Breztri Aerosphere 160-9-4.8 MCG/ACT Aero Generic drug:  Budeson-Glycopyrrol-Formoterol Inhale 2 puffs into the lungs in the morning and at bedtime.   clopidogrel 75 MG tablet Commonly known as: PLAVIX Take 1 tablet (75 mg total) by mouth daily.   dutasteride 0.5 MG capsule Commonly known as: AVODART Take 1 capsule (0.5 mg total) by mouth daily. In the afternoon   escitalopram 20 MG tablet Commonly known as: LEXAPRO Take 20 mg by mouth every morning.   furosemide 20 MG tablet Commonly known as: LASIX Take 1 tablet (20 mg total) by mouth 2 (two) times daily. For fluid   guaiFENesin 600 MG 12 hr tablet Commonly known as: MUCINEX Take 1 tablet (600 mg total) by mouth 2 (two) times daily.   isosorbide mononitrate 120 MG 24 hr tablet Commonly known as: IMDUR Take 1 tablet (120 mg total) by mouth every morning.   levothyroxine 75 MCG tablet Commonly known as: SYNTHROID Take 75 mcg by mouth daily.   memantine 10 MG tablet Commonly known as: NAMENDA Take 10 mg by mouth 2 (two) times daily.   nitroGLYCERIN 0.4 MG SL tablet Commonly known as: NITROSTAT Place 1 tablet (0.4 mg total) under the tongue every 5 (five) minutes as needed for chest pain.   oxycodone 30 MG immediate release tablet Commonly known as: ROXICODONE Take 30 mg by mouth every 6 (six) hours as needed for pain.   pantoprazole 40 MG tablet Commonly known as: Protonix Take 1 tablet (40 mg total) by mouth 2 (two) times daily for 14 days, THEN 1 tablet (40 mg total) daily. Start taking on: February 14, 2023   potassium chloride SA 20 MEQ tablet Commonly known as: KLOR-CON M Take 1 tablet (20 mEq total) by mouth daily.   QUEtiapine 25 MG tablet Commonly known as: SEROQUEL Take 25 mg by mouth at bedtime as needed (sleep).   ranolazine 1000 MG SR tablet Commonly known as: Ranexa Take 1 tablet (1,000 mg total) by mouth 2 (two) times daily.   rosuvastatin 40 MG tablet Commonly known as: CRESTOR Take 1 tablet (40 mg total) by mouth daily at 6 PM.         Signature:  Chesley Mires, MD [facility] Pager - [phone] 03/16/2023, 11:18 AM

## 2023-03-24 ENCOUNTER — Ambulatory Visit (HOSPITAL_COMMUNITY)
Admission: RE | Admit: 2023-03-24 | Discharge: 2023-03-24 | Disposition: A | Discharge status: Home / Self Care | Payer: Medicare HMO | Source: Ambulatory Visit | Attending: Pulmonary Disease | Admitting: Pulmonary Disease

## 2023-03-24 DIAGNOSIS — R911 Solitary pulmonary nodule: Secondary | ICD-10-CM | POA: Insufficient documentation

## 2023-03-24 MED ORDER — FLUDEOXYGLUCOSE F - 18 (FDG) INJECTION
10.9100 | Freq: Once | INTRAVENOUS | Status: AC | PRN
  Administered 2023-03-24: 10.91 via INTRAVENOUS

## 2023-03-28 ENCOUNTER — Telehealth: Payer: Self-pay | Admitting: Pulmonary Disease

## 2023-03-28 DIAGNOSIS — R911 Solitary pulmonary nodule: Secondary | ICD-10-CM

## 2023-03-28 DIAGNOSIS — R942 Abnormal results of pulmonary function studies: Secondary | ICD-10-CM

## 2023-03-28 NOTE — Telephone Encounter (Signed)
PET scan 03/24/23 >> 1.8 cm nodule superior segment LLL with 4.63 SUV (was 1.5 cm on CT chest from 12/28/22)   Attempted to call [name] and his wife, but no answer.  Please let him know that his PET scan shows activity in the lt lower lung nodule and we need to discuss next steps in more detail.  Please find out which number he can reached at so I can discuss with him what the plan options are.

## 2023-03-29 NOTE — Telephone Encounter (Signed)
Discussed results with pt.  Reviewed three different options for management: 1) referral to radiation oncology to assess for empiric treatment 2) attempt biopsy, but there is concern about his ability to tolerate biopsy if he develops a complication 3) clinical observation  He would like to have this reviewed by radiation oncology first to determine whether he would be a candidate for empiric therapy.  Will place referral for this.

## 2023-03-29 NOTE — Telephone Encounter (Signed)
Called patient. Spoke to his wife Everlene Farrier. She said they are free to discuss results around 11:30/11:45 today and can be reached at [phone].

## 2023-03-30 NOTE — Progress Notes (Signed)
Thoracic Location of Tumor / Histology: left lower lobe Pet scan on 03-26-23 IMPRESSION: 1. There is FDG uptake associated with the subpleural nodule within the superior segment of the left lower lobe. This is suspicious for primary bronchogenic carcinoma. 2. No signs of FDG avid nodal or distant metastatic disease. 3. Aortic Atherosclerosis (ICD10-I70.0) and Emphysema (ICD10-J43.9)  Chest Ct on 12-28-22 IMPRESSION: 12 mm nodule in the superior segment of the left lower lobe with some spiculation.   Per Fleischner Society Guidelines, consider a non-contrast Chest CT at 3 months, a PET/CT, or tissue sampling.  These guidelines do not apply to immunocompromised patients and patients with cancer. Follow up in patients with significant comorbidities as clinically warranted. For lung cancer screening, adhere to Lung-RADS guidelines. Reference: Radiology. 2017; 284(1):228-43.   10 mm short axis node in the left hilum although this appears stable from the prior CT examination.   Aortic Atherosclerosis (ICD10-I70.0) and Emphysema (ICD10-J43.9).  Patient presented with symptoms of: "Breathing is not doing well" He fell in November. Had a chest xray that showed a nodule in Lt lung. This was confirmed on CT imaging from January.   Biopsies revealed: none in EMR  Tobacco/Marijuana/Snuff/ETOH use:  He  reports that he quit smoking about 28 years ago. His smoking use included cigarettes. He has a 80.00 pack-year smoking history. He has never used smokeless tobacco. He reports that he does not drink alcohol and does not use drugs.   Past/Anticipated interventions by cardiothoracic surgery, if any:  03-28-23 Dr. Craige Cotta PET scan 03/24/23 >> 1.8 cm nodule superior segment LLL with 4.63 SUV (was 1.5 cm on CT chest from 12/28/22)     Attempted to call Mr. Farrell and his wife, but no answer.   Please let him know that his PET scan shows activity in the lt lower lung nodule and we need to discuss next  steps in more detail.  Please find out which number he can reached at so I can discuss with him what the plan options are.  Dr. Craige Cotta on 03-16-23 Assessment/Plan:    Lt lower lobe lung nodule. - reviewed imaging results - they would like to get more information before deciding about additional interventions - will arrange for PET scan   COPD. - breztri two puffs bid - prn abluterol - he has a nebulizer - prn mucinex - defer PFT for now since results not likely to change management at this point  Past/Anticipated interventions by medical oncology, if any:  None in EMR  Signs/Symptoms Weight changes, if any: No Respiratory complaints, if any: He reports SOB with exertion, going on for about a year. Hemoptysis, if any: He reports productive cough, grayish sputum.  Denies hemoptysis. Pain issues, if any:  He reports occasional chest pain, takes nitro in the event.  SAFETY ISSUES: Prior radiation? No Pacemaker/ICD?  No Possible current pregnancy? N/a Is the patient on methotrexate? No  Current Complaints / other details:   He has some stents.

## 2023-03-31 ENCOUNTER — Other Ambulatory Visit: Payer: Self-pay | Admitting: Urology

## 2023-03-31 DIAGNOSIS — N138 Other obstructive and reflux uropathy: Secondary | ICD-10-CM

## 2023-04-04 NOTE — Progress Notes (Signed)
Radiation Oncology         [phone] ________________________________  Initial Outpatient Consultation  Name: Osborne Cascohomas E Noa MRN: [id number]  Date: 04/05/2023  DOB: 25-Sep-1934  CC:Elfredia NevinsFusco, Lawrence, MD  Coralyn HellingSood, Vineet, MD   REFERRING PHYSICIAN: Coralyn HellingSood, Vineet, MD  DIAGNOSIS: There were no encounter diagnoses.  Left lower lobe nodule   HISTORY OF PRESENT ILLNESS::Costa E Donnie Ahoilley is a 89 y.o. male former smoker with a history of COPD. Today, he is accompanied by ***. he is seen as a courtesy of Dr. Craige CottaSood for an opinion concerning radiation therapy as part of management for his recently diagnosed LLL pulmonary nodule.   The patient presented to the ED on 11/22/22 with confusion and persistent chest pain (substernal to left-sided chest pain with radiation to left shoulder. He also reported having a chronic cough productive of tan-colored sputum, shortness of breath, and frequent dysphagia with eating and drinking. The patient's son who was present at the time also noticed an increase in confusion in the patient that correlated with an increase in his chest pain. He was accordingly admitted and underwent a chest x-ray the following day (11/23/22) which showed mild right basilar atelectasis but not other abnormalities in either lung. He also had a CT AP performed on 11/28 which showed no acute findings in the visible lower chest, or concerning findings elsewhere in the abdomen or pelvis. EKG performed showed sinus rhythm with left bundle branch block. Acute on chronic diastolic dysfunction improved after IV Lasix/diuresis. (He also had a head CT performed which was unremarkable). Hospital course was otherwise unremarkable and he was discharged on 11/29 with instructions to follow up with cardiology for further management of unstable angina.    The patient soon after presented to his PCP on 12/07/22 with c/o hemoptysis. Chest x-ray performed on 12/12 showed a new nodular opacity in left mid lung and some  reticulonodular opacities in the bilateral lower lungs, concerning for multifocal infection.  Based on clinical data from imaging, it seems as though the patient was found to pneumonia around this time (however no encounter details are available to elaborate on this).   The patient returned to his PCP on 12/23/22 with chest pain x 3 days (right greater than left). This prompted a repeat chest x-ray on 12/23/22 which showed persistence of the nodular density in the left upper lobe, measuring 12 x 9 mm. COPD changes with bibasilar atelectasis were also appreciated.   Chest CT with contrast on 12/28/22 (ordered by his PCP) demonstrated a somewhat spiculated nodule in the superior segment of the left lower lobe, measuring approximately 1.6 x 1.5 x 0.7 cm, corresponding to x-ray findings. CT otherwise showed no mediastinal ir hilar adenopathy, or evidence of metastatic disease. A small 10 mm short axis node was noted in the left hilum inferiorly, however this is relatively stable when compared to prior examinations.   Accordingly, the patient followed up with Dr. Craige CottaSood [facility] on 03/16/23. During which time, the patient reported some ongoing right sided soreness. He actually reported a slight decrease in severity of his coughing and wheezing and denied any other concerns. Regarding his new LLL nodule, the patient requested more information before making a decision regarding treatment.   Subsequently, Dr. Craige CottaSood arranged for a PET scan on 03/24/23 which showed FDG uptake (SUV max of 4.63) associated with the subpleural nodule within the superior segment of the left lower lobe, measuring 1.8 cm (previously 1.6 cm), suspicious for primary bronchogenic carcinoma. PET otherwise shows no  signs of FDG avid nodal, or distant metastatic disease.   Dr. Craige Cotta conveyed these results to the patients and reviewed treatment options including radiation, biopsies, or clinical observation. The patient ultimately  requested a referral to radiation oncology for consideration of radiation therapy which we will discuss in detail today.    PREVIOUS RADIATION THERAPY: No  PAST MEDICAL HISTORY:  Past Medical History:  Diagnosis Date   Alzheimer disease (HCC)    Anxiety    Arthritis    Atrophic gastritis    a. By EGD 02/2013.   Carotid artery disease (HCC)    a. mild-mod plaque, <50% stenosis bilat by duplex 2018.   Coronary atherosclerosis of native coronary artery    a. Multivessel s/p CABG 1996. b. DESx2 to SVG-PDA in 2012 c. DES to distal LM in 11/2019 d. cath in 02/2021 showing patent LM stent, patent LIMA-LAD, patent SVG-OM1 with chronically occluded jump limb to OM2 and chronic occlusion of SVG-D2 and SVG-PDA and no targets for intervention   DDD (degenerative disc disease)    Chronic back pain   Dementia (HCC)    Enlarged prostate    Essential hypertension    Hematuria    Hypothyroidism    LBBB (left bundle branch block)    MI (myocardial infarction) (HCC)    Mixed hyperlipidemia    OA (osteoarthritis)    OSA (obstructive sleep apnea)    Pneumonia due to COVID-19 virus    February 2021   Sinus bradycardia    a. Aricept and BB discontinued due to this.    PAST SURGICAL HISTORY: Past Surgical History:  Procedure Laterality Date   CATARACT EXTRACTION W/PHACO Left 03/22/2022   Procedure: CATARACT EXTRACTION PHACO AND INTRAOCULAR LENS PLACEMENT (IOC);  Surgeon: Fabio Pierce, MD;  Location: [facility];  Service: Ophthalmology;  Laterality: Left;  CDE: 25.22   COLONOSCOPY  08/03/2004   Jenkins-numerous large diverticula in the descending, transverse, descending, and sigmoid colon. Otherwise normal exam.   COLONOSCOPY  07/12/2012   RMR: External hemorrhoidal tag; multiple rectal and colonic polyps removed and/or treated as described above. Pancolonic diverticulosis. Bx-tubular adenomas, rectal hyperplastic polyp. next colonoscopy in 06/2015.   COLONOSCOPY N/A 06/22/2016   Procedure: COLONOSCOPY;   Surgeon: Franky Macho, MD;  Location: [facility];  Service: Gastroenterology;  Laterality: N/A;  730   CORONARY ANGIOPLASTY WITH STENT PLACEMENT     CORONARY ARTERY BYPASS GRAFT  1996   LIMA to LAD, SVG to D2, SVG to PDA, SVG to OM1 and OM2   CORONARY STENT INTERVENTION N/A 12/10/2019   Procedure: CORONARY STENT INTERVENTION;  Surgeon: Kathleene Hazel, MD;  Location: [facility];  Service: Cardiovascular;  Laterality: N/A;   CORONARY ULTRASOUND/IVUS N/A 12/10/2019   Procedure: Intravascular Ultrasound/IVUS;  Surgeon: Kathleene Hazel, MD;  Location: [facility];  Service: Cardiovascular;  Laterality: N/A;   ESOPHAGOGASTRODUODENOSCOPY N/A 03/16/2013   Procedure: ESOPHAGOGASTRODUODENOSCOPY (EGD);  Surgeon: Corbin Ade, MD;  Location: [facility];  Service: Endoscopy;  Laterality: N/A;  12:00-moved to 1030 Leigh Ann notified pt   ESOPHAGOGASTRODUODENOSCOPY N/A 03/06/2013   Procedure: ESOPHAGOGASTRODUODENOSCOPY (EGD);  Surgeon: Corbin Ade, MD;  Location: [facility];  Service: Endoscopy;  Laterality: N/A;   HERNIA REPAIR     LEFT HEART CATH AND CORS/GRAFTS ANGIOGRAPHY N/A 12/10/2019   Procedure: LEFT HEART CATH AND CORS/GRAFTS ANGIOGRAPHY;  Surgeon: Kathleene Hazel, MD;  [facility];  Service: Cardiovascular;  Laterality: N/A;   LEFT HEART CATH  AND CORS/GRAFTS ANGIOGRAPHY N/A 03/02/2021   Procedure: LEFT HEART CATH AND CORS/GRAFTS ANGIOGRAPHY;  Surgeon: Yvonne Kendall, MD;  [facility];  Service: Cardiovascular;  Laterality: N/A;    FAMILY HISTORY:  Family History  Problem Relation Age of Onset   Heart disease Other    Heart attack Mother    Colon cancer Neg Hx     SOCIAL HISTORY: Reports that he quit smoking about 28 years ago. His smoking use included cigarettes. He has a 80.00 pack-year smoking history  Social History   Tobacco Use   Smoking status: Former    Packs/day: 2.00    Years: 40.00     Additional pack years: 0.00    Total pack years: 80.00    Types: Cigarettes    Quit date: 12/27/1994    Years since quitting: 28.2   Smokeless tobacco: Never  Vaping Use   Vaping Use: Never used  Substance Use Topics   Alcohol use: No    Alcohol/week: 0.0 standard drinks of alcohol   Drug use: No    ALLERGIES: No Known Allergies  MEDICATIONS:  Current Outpatient Medications  Medication Sig Dispense Refill   albuterol (PROVENTIL) (2.5 MG/3ML) 0.083% nebulizer solution Take 3 mLs (2.5 mg total) by nebulization every 2 (two) hours as needed for wheezing or shortness of breath. 75 mL 3   albuterol (VENTOLIN HFA) 108 (90 Base) MCG/ACT inhaler Inhale 2 puffs into the lungs every 6 (six) hours as needed for wheezing or shortness of breath. 18 g 3   alfuzosin (UROXATRAL) 10 MG 24 hr tablet Take 1 tablet (10 mg total) by mouth daily with breakfast. 30 tablet 11   aspirin EC 81 MG tablet Take 1 tablet (81 mg total) by mouth daily. Swallow whole. 90 tablet 3   Budeson-Glycopyrrol-Formoterol (BREZTRI AEROSPHERE) 160-9-4.8 MCG/ACT AERO Inhale 2 puffs into the lungs in the morning and at bedtime. 10.7 g 5   clopidogrel (PLAVIX) 75 MG tablet Take 1 tablet (75 mg total) by mouth daily. 30 tablet 11   dutasteride (AVODART) 0.5 MG capsule Take 1 capsule (0.5 mg total) by mouth daily. In the afternoon 90 capsule 3   escitalopram (LEXAPRO) 20 MG tablet Take 20 mg by mouth every morning.      furosemide (LASIX) 20 MG tablet Take 1 tablet (20 mg total) by mouth 2 (two) times daily. For fluid 60 tablet 5   guaiFENesin (MUCINEX) 600 MG 12 hr tablet Take 1 tablet (600 mg total) by mouth 2 (two) times daily. 20 tablet 0   isosorbide mononitrate (IMDUR) 120 MG 24 hr tablet Take 1 tablet (120 mg total) by mouth every morning. 30 tablet 11   levothyroxine (SYNTHROID) 75 MCG tablet Take 75 mcg by mouth daily.     memantine (NAMENDA) 10 MG tablet Take 10 mg by mouth 2 (two) times daily.      nitroGLYCERIN (NITROSTAT)  0.4 MG SL tablet Place 1 tablet (0.4 mg total) under the tongue every 5 (five) minutes as needed for chest pain. 15 tablet 5   oxycodone (ROXICODONE) 30 MG immediate release tablet Take 30 mg by mouth every 6 (six) hours as needed for pain.     pantoprazole (PROTONIX) 40 MG tablet Take 1 tablet (40 mg total) by mouth 2 (two) times daily for 14 days, THEN 1 tablet (40 mg total) daily. 388 tablet 0   potassium chloride SA (KLOR-CON M) 20 MEQ tablet Take 1 tablet (20 mEq total) by mouth daily. 30 tablet  3   QUEtiapine (SEROQUEL) 25 MG tablet Take 25 mg by mouth at bedtime as needed (sleep).     ranolazine (RANEXA) 1000 MG SR tablet Take 1 tablet (1,000 mg total) by mouth 2 (two) times daily. 60 tablet 5   rosuvastatin (CRESTOR) 40 MG tablet Take 1 tablet (40 mg total) by mouth daily at 6 PM. 30 tablet 5   No current facility-administered medications for this encounter.    REVIEW OF SYSTEMS:  A 10+ POINT REVIEW OF SYSTEMS WAS OBTAINED including neurology, dermatology, psychiatry, cardiac, respiratory, lymph, extremities, GI, GU, musculoskeletal, constitutional, reproductive, HEENT. ***   PHYSICAL EXAM:  vitals were not taken for this visit.   General: Alert and oriented, in no acute distress HEENT: Head is normocephalic. Extraocular movements are intact. Oropharynx is clear. Neck: Neck is supple, no palpable cervical or supraclavicular lymphadenopathy. Heart: Regular in rate and rhythm with no murmurs, rubs, or gallops. Chest: Clear to auscultation bilaterally, with no rhonchi, wheezes, or rales. Abdomen: Soft, nontender, nondistended, with no rigidity or guarding. Extremities: No cyanosis or edema. Lymphatics: see Neck Exam Skin: No concerning lesions. Musculoskeletal: symmetric strength and muscle tone throughout. Neurologic: Cranial nerves II through XII are grossly intact. No obvious focalities. Speech is fluent. Coordination is intact. Psychiatric: Judgment and insight are intact. Affect is  appropriate. ***  ECOG = ***  0 - Asymptomatic (Fully active, able to carry on all predisease activities without restriction)  1 - Symptomatic but completely ambulatory (Restricted in physically strenuous activity but ambulatory and able to carry out work of a light or sedentary nature. For example, light housework, office work)  2 - Symptomatic, <50% in bed during the day (Ambulatory and capable of all self care but unable to carry out any work activities. Up and about more than 50% of waking hours)  3 - Symptomatic, >50% in bed, but not bedbound (Capable of only limited self-care, confined to bed or chair 50% or more of waking hours)  4 - Bedbound (Completely disabled. Cannot carry on any self-care. Totally confined to bed or chair)  5 - Death   Santiago Glad MM, Creech RH, Tormey DC, et al. [phone]). "Toxicity and response criteria of the [facility]". Am. Evlyn Clines. Oncol. 5 (6): 649-55  LABORATORY DATA:  Lab Results  Component Value Date   WBC 4.2 11/24/2022   HGB 9.9 (L) 11/24/2022   HCT 31.0 (L) 11/24/2022   MCV 98.1 11/24/2022   PLT 172 11/24/2022   NEUTROABS 1.8 11/23/2022   Lab Results  Component Value Date   NA 138 11/24/2022   K 3.5 11/24/2022   CL 104 11/24/2022   CO2 28 11/24/2022   GLUCOSE 112 (H) 11/24/2022   BUN 20 11/24/2022   CREATININE 1.13 11/24/2022   CALCIUM 8.4 (L) 11/24/2022      RADIOGRAPHY: NM PET Image Initial (PI) Skull Base To Thigh  Result Date: 03/26/2023 CLINICAL DATA:  Initial treatment strategy for lung nodule . EXAM: NUCLEAR MEDICINE PET SKULL BASE TO THIGH TECHNIQUE: 10.9 mCi F-18 FDG was injected intravenously. Full-ring PET imaging was performed from the skull base to thigh after the radiotracer. CT data was obtained and used for attenuation correction and anatomic localization. Fasting blood glucose: 138 mg/dl COMPARISON:  CT chest 34/19/3790 FINDINGS: Mediastinal blood pool activity: SUV max 2.92 Liver activity: SUV max NA  NECK: No hypermetabolic lymph nodes in the neck. Incidental CT findings: None. CHEST: Within the superior segment of the left lower lobe there is a  subpleural lung nodule which measures 1.8 cm and has an SUV max of 4.63, image 59/7. On the CT from 12/28/2022 this measured 1.5 cm. No additional tracer avid lung nodules identified. No tracer avid supraclavicular, axillary, mediastinal, or hilar lymph nodes. Incidental CT findings: Mild cardiac enlargement aortic atherosclerosis. Status post CABG. Mild paraseptal and centrilobular emphysema. ABDOMEN/PELVIS: No abnormal hypermetabolic activity within the liver, pancreas, adrenal glands, or spleen. No hypermetabolic lymph nodes in the abdomen or pelvis. Incidental CT findings: Aortic atherosclerosis. Right kidney cysts identified. No follow-up imaging recommended. SKELETON: No focal hypermetabolic activity to suggest skeletal metastasis. Incidental CT findings: None. IMPRESSION: 1. There is FDG uptake associated with the subpleural nodule within the superior segment of the left lower lobe. This is suspicious for primary bronchogenic carcinoma. 2. No signs of FDG avid nodal or distant metastatic disease. 3. Aortic Atherosclerosis (ICD10-I70.0) and Emphysema (ICD10-J43.9). Electronically Signed   By: Signa Kell M.D.   On: 03/26/2023 16:03      IMPRESSION: Left lower lobe nodule   ***  Today, I talked to the patient and family about the findings and work-up thus far.  We discussed the natural history of *** and general treatment, highlighting the role of radiotherapy in the management.  We discussed the available radiation techniques, and focused on the details of logistics and delivery.  We reviewed the anticipated acute and late sequelae associated with radiation in this setting.  The patient was encouraged to ask questions that I answered to the best of my ability. *** A patient consent form was discussed and signed.  We retained a copy for our records.  The  patient would like to proceed with radiation and will be scheduled for CT simulation.  PLAN: ***    *** minutes of total time was spent for this patient encounter, including preparation, face-to-face counseling with the patient and coordination of care, physical exam, and documentation of the encounter.   ------------------------------------------------  Billie Lade, PhD, MD  This document serves as a record of services personally performed by Antony Blackbird, MD. It was created on his behalf by Neena Rhymes, a trained medical scribe. The creation of this record is based on the scribe's personal observations and the provider's statements to them. This document has been checked and approved by the attending provider.

## 2023-04-05 ENCOUNTER — Encounter: Payer: Self-pay | Admitting: Radiation Oncology

## 2023-04-05 ENCOUNTER — Ambulatory Visit
Admission: RE | Admit: 2023-04-05 | Discharge: 2023-04-05 | Disposition: A | Discharge status: Home / Self Care | Payer: Medicare HMO | Source: Ambulatory Visit | Attending: Radiation Oncology | Admitting: Radiation Oncology

## 2023-04-05 ENCOUNTER — Other Ambulatory Visit: Payer: Self-pay

## 2023-04-05 VITALS — BP 118/62 | HR 61 | Temp 97.0°F | Resp 18 | Ht 70.0 in | Wt 214.2 lb

## 2023-04-05 DIAGNOSIS — Z7982 Long term (current) use of aspirin: Secondary | ICD-10-CM | POA: Insufficient documentation

## 2023-04-05 DIAGNOSIS — I447 Left bundle-branch block, unspecified: Secondary | ICD-10-CM | POA: Diagnosis not present

## 2023-04-05 DIAGNOSIS — Z7902 Long term (current) use of antithrombotics/antiplatelets: Secondary | ICD-10-CM | POA: Diagnosis not present

## 2023-04-05 DIAGNOSIS — I252 Old myocardial infarction: Secondary | ICD-10-CM | POA: Insufficient documentation

## 2023-04-05 DIAGNOSIS — Z79890 Hormone replacement therapy (postmenopausal): Secondary | ICD-10-CM | POA: Diagnosis not present

## 2023-04-05 DIAGNOSIS — Z8616 Personal history of COVID-19: Secondary | ICD-10-CM | POA: Diagnosis not present

## 2023-04-05 DIAGNOSIS — I119 Hypertensive heart disease without heart failure: Secondary | ICD-10-CM | POA: Insufficient documentation

## 2023-04-05 DIAGNOSIS — N400 Enlarged prostate without lower urinary tract symptoms: Secondary | ICD-10-CM | POA: Insufficient documentation

## 2023-04-05 DIAGNOSIS — G4730 Sleep apnea, unspecified: Secondary | ICD-10-CM | POA: Insufficient documentation

## 2023-04-05 DIAGNOSIS — F0280 Dementia in other diseases classified elsewhere without behavioral disturbance: Secondary | ICD-10-CM | POA: Diagnosis not present

## 2023-04-05 DIAGNOSIS — I2510 Atherosclerotic heart disease of native coronary artery without angina pectoris: Secondary | ICD-10-CM | POA: Diagnosis not present

## 2023-04-05 DIAGNOSIS — C3432 Malignant neoplasm of lower lobe, left bronchus or lung: Secondary | ICD-10-CM | POA: Insufficient documentation

## 2023-04-05 DIAGNOSIS — I700 Atherosclerosis of aorta: Secondary | ICD-10-CM | POA: Diagnosis not present

## 2023-04-05 DIAGNOSIS — F17210 Nicotine dependence, cigarettes, uncomplicated: Secondary | ICD-10-CM | POA: Diagnosis not present

## 2023-04-05 DIAGNOSIS — G309 Alzheimer's disease, unspecified: Secondary | ICD-10-CM | POA: Diagnosis not present

## 2023-04-05 DIAGNOSIS — E785 Hyperlipidemia, unspecified: Secondary | ICD-10-CM | POA: Insufficient documentation

## 2023-04-05 DIAGNOSIS — J432 Centrilobular emphysema: Secondary | ICD-10-CM | POA: Insufficient documentation

## 2023-04-05 DIAGNOSIS — I10 Essential (primary) hypertension: Secondary | ICD-10-CM | POA: Insufficient documentation

## 2023-04-05 DIAGNOSIS — Z87891 Personal history of nicotine dependence: Secondary | ICD-10-CM | POA: Diagnosis not present

## 2023-04-05 DIAGNOSIS — E039 Hypothyroidism, unspecified: Secondary | ICD-10-CM | POA: Insufficient documentation

## 2023-04-05 DIAGNOSIS — Z79899 Other long term (current) drug therapy: Secondary | ICD-10-CM | POA: Diagnosis not present

## 2023-04-07 DIAGNOSIS — J449 Chronic obstructive pulmonary disease, unspecified: Secondary | ICD-10-CM | POA: Diagnosis not present

## 2023-04-07 DIAGNOSIS — M48061 Spinal stenosis, lumbar region without neurogenic claudication: Secondary | ICD-10-CM | POA: Diagnosis not present

## 2023-04-07 DIAGNOSIS — I209 Angina pectoris, unspecified: Secondary | ICD-10-CM | POA: Diagnosis not present

## 2023-04-07 DIAGNOSIS — I5032 Chronic diastolic (congestive) heart failure: Secondary | ICD-10-CM | POA: Diagnosis not present

## 2023-04-07 DIAGNOSIS — I5022 Chronic systolic (congestive) heart failure: Secondary | ICD-10-CM | POA: Diagnosis not present

## 2023-04-11 ENCOUNTER — Ambulatory Visit
Admission: RE | Admit: 2023-04-11 | Discharge: 2023-04-11 | Disposition: A | Discharge status: Home / Self Care | Payer: Medicare HMO | Source: Ambulatory Visit | Attending: Radiation Oncology | Admitting: Radiation Oncology

## 2023-04-11 DIAGNOSIS — C3432 Malignant neoplasm of lower lobe, left bronchus or lung: Secondary | ICD-10-CM | POA: Insufficient documentation

## 2023-04-11 DIAGNOSIS — F17210 Nicotine dependence, cigarettes, uncomplicated: Secondary | ICD-10-CM | POA: Diagnosis not present

## 2023-04-11 DIAGNOSIS — Z87891 Personal history of nicotine dependence: Secondary | ICD-10-CM | POA: Diagnosis not present

## 2023-04-11 DIAGNOSIS — Z510 Encounter for antineoplastic radiation therapy: Secondary | ICD-10-CM | POA: Diagnosis not present

## 2023-04-13 DIAGNOSIS — F17210 Nicotine dependence, cigarettes, uncomplicated: Secondary | ICD-10-CM | POA: Diagnosis not present

## 2023-04-13 DIAGNOSIS — C3432 Malignant neoplasm of lower lobe, left bronchus or lung: Secondary | ICD-10-CM | POA: Diagnosis not present

## 2023-04-19 DIAGNOSIS — C3432 Malignant neoplasm of lower lobe, left bronchus or lung: Secondary | ICD-10-CM | POA: Diagnosis not present

## 2023-04-19 DIAGNOSIS — F17210 Nicotine dependence, cigarettes, uncomplicated: Secondary | ICD-10-CM | POA: Diagnosis not present

## 2023-04-19 DIAGNOSIS — Z510 Encounter for antineoplastic radiation therapy: Secondary | ICD-10-CM | POA: Diagnosis not present

## 2023-04-19 DIAGNOSIS — Z87891 Personal history of nicotine dependence: Secondary | ICD-10-CM | POA: Diagnosis not present

## 2023-04-26 ENCOUNTER — Other Ambulatory Visit: Payer: Self-pay

## 2023-04-26 ENCOUNTER — Ambulatory Visit: Payer: Medicare HMO | Admitting: Pulmonary Disease

## 2023-04-26 ENCOUNTER — Ambulatory Visit
Admission: RE | Admit: 2023-04-26 | Discharge: 2023-04-26 | Disposition: A | Discharge status: Home / Self Care | Payer: Medicare HMO | Source: Ambulatory Visit | Attending: Radiation Oncology | Admitting: Radiation Oncology

## 2023-04-26 DIAGNOSIS — Z87891 Personal history of nicotine dependence: Secondary | ICD-10-CM | POA: Diagnosis not present

## 2023-04-26 DIAGNOSIS — C3432 Malignant neoplasm of lower lobe, left bronchus or lung: Secondary | ICD-10-CM

## 2023-04-26 DIAGNOSIS — I5032 Chronic diastolic (congestive) heart failure: Secondary | ICD-10-CM | POA: Diagnosis not present

## 2023-04-26 DIAGNOSIS — I10 Essential (primary) hypertension: Secondary | ICD-10-CM | POA: Diagnosis not present

## 2023-04-26 DIAGNOSIS — Z510 Encounter for antineoplastic radiation therapy: Secondary | ICD-10-CM | POA: Diagnosis not present

## 2023-04-26 DIAGNOSIS — J449 Chronic obstructive pulmonary disease, unspecified: Secondary | ICD-10-CM | POA: Diagnosis not present

## 2023-04-26 DIAGNOSIS — E782 Mixed hyperlipidemia: Secondary | ICD-10-CM | POA: Diagnosis not present

## 2023-04-26 LAB — RAD ONC ARIA SESSION SUMMARY
Course Elapsed Days: 0
Plan Fractions Treated to Date: 1
Plan Prescribed Dose Per Fraction: 18 Gy
Plan Total Fractions Prescribed: 3
Plan Total Prescribed Dose: 54 Gy
Reference Point Dosage Given to Date: 18 Gy
Reference Point Session Dosage Given: 18 Gy
Session Number: 1

## 2023-04-27 ENCOUNTER — Ambulatory Visit: Payer: Medicare HMO

## 2023-04-28 ENCOUNTER — Other Ambulatory Visit: Payer: Self-pay

## 2023-04-28 ENCOUNTER — Ambulatory Visit
Admission: RE | Admit: 2023-04-28 | Discharge: 2023-04-28 | Disposition: A | Discharge status: Home / Self Care | Payer: Medicare HMO | Source: Ambulatory Visit | Attending: Radiation Oncology | Admitting: Radiation Oncology

## 2023-04-28 ENCOUNTER — Ambulatory Visit: Payer: Medicare HMO

## 2023-04-28 DIAGNOSIS — C3432 Malignant neoplasm of lower lobe, left bronchus or lung: Secondary | ICD-10-CM | POA: Insufficient documentation

## 2023-04-28 LAB — RAD ONC ARIA SESSION SUMMARY
Course Elapsed Days: 2
Plan Fractions Treated to Date: 2
Plan Prescribed Dose Per Fraction: 18 Gy
Plan Total Fractions Prescribed: 3
Plan Total Prescribed Dose: 54 Gy
Reference Point Dosage Given to Date: 36 Gy
Reference Point Session Dosage Given: 18 Gy
Session Number: 2

## 2023-05-02 ENCOUNTER — Ambulatory Visit
Admission: RE | Admit: 2023-05-02 | Discharge: 2023-05-02 | Disposition: A | Discharge status: Home / Self Care | Payer: Medicare HMO | Source: Ambulatory Visit | Attending: Radiation Oncology | Admitting: Radiation Oncology

## 2023-05-02 ENCOUNTER — Other Ambulatory Visit: Payer: Self-pay

## 2023-05-02 DIAGNOSIS — Z510 Encounter for antineoplastic radiation therapy: Secondary | ICD-10-CM | POA: Diagnosis not present

## 2023-05-02 DIAGNOSIS — F17210 Nicotine dependence, cigarettes, uncomplicated: Secondary | ICD-10-CM | POA: Diagnosis not present

## 2023-05-02 DIAGNOSIS — C3432 Malignant neoplasm of lower lobe, left bronchus or lung: Secondary | ICD-10-CM | POA: Diagnosis not present

## 2023-05-02 LAB — RAD ONC ARIA SESSION SUMMARY
Course Elapsed Days: 6
Plan Fractions Treated to Date: 3
Plan Prescribed Dose Per Fraction: 18 Gy
Plan Total Fractions Prescribed: 3
Plan Total Prescribed Dose: 54 Gy
Reference Point Dosage Given to Date: 54 Gy
Reference Point Session Dosage Given: 18 Gy
Session Number: 3

## 2023-05-04 ENCOUNTER — Other Ambulatory Visit: Payer: Self-pay | Admitting: Urology

## 2023-05-04 DIAGNOSIS — N401 Enlarged prostate with lower urinary tract symptoms: Secondary | ICD-10-CM

## 2023-05-05 NOTE — Radiation Completion Notes (Signed)
Patient Name: Dustin [name], Dustin [name] MRN: [id number] Date of Birth: 02-Sep-1934 Referring Physician: Coralyn Helling, M.D. Date of Service: 2023-05-05 Radiation Oncologist: Arnette Schaumann, M.D. [facility]                             RADIATION ONCOLOGY END OF TREATMENT NOTE     Diagnosis: C34.32 Malignant neoplasm of lower lobe, left bronchus or lung Intent: Curative     ==========DELIVERED PLANS==========  First Treatment Date: 2023-04-26 - Last Treatment Date: 2023-05-02   Plan Name: Lung_L_SBRT Site: Lung, Left Technique: SBRT/SRT-IMRT Mode: Photon Dose Per Fraction: 18 Gy Prescribed Dose (Delivered / Prescribed): 54 Gy / 54 Gy Prescribed Fxs (Delivered / Prescribed): 3 / 3     ==========ON TREATMENT VISIT DATES========== 2023-04-26, 2023-04-28, 2023-05-02, 2023-05-02     ==========UPCOMING VISITS==========       ==========APPENDIX - ON TREATMENT VISIT NOTES==========   See weekly On Treatment Notes is Epic for details.

## 2023-05-06 ENCOUNTER — Telehealth: Payer: Self-pay | Admitting: *Deleted

## 2023-05-06 NOTE — Telephone Encounter (Signed)
RETURNED PATIENT'S DAUGHTER'S PHONE CALL, SPOKE WITH PATIENT'S DAUGHTER- Dustin [name]

## 2023-05-10 DIAGNOSIS — I209 Angina pectoris, unspecified: Secondary | ICD-10-CM | POA: Diagnosis not present

## 2023-05-10 DIAGNOSIS — M48061 Spinal stenosis, lumbar region without neurogenic claudication: Secondary | ICD-10-CM | POA: Diagnosis not present

## 2023-05-10 DIAGNOSIS — Z6830 Body mass index (BMI) 30.0-30.9, adult: Secondary | ICD-10-CM | POA: Diagnosis not present

## 2023-05-10 DIAGNOSIS — E6609 Other obesity due to excess calories: Secondary | ICD-10-CM | POA: Diagnosis not present

## 2023-05-10 DIAGNOSIS — B078 Other viral warts: Secondary | ICD-10-CM | POA: Diagnosis not present

## 2023-05-10 DIAGNOSIS — I872 Venous insufficiency (chronic) (peripheral): Secondary | ICD-10-CM | POA: Diagnosis not present

## 2023-05-10 DIAGNOSIS — I5032 Chronic diastolic (congestive) heart failure: Secondary | ICD-10-CM | POA: Diagnosis not present

## 2023-05-10 DIAGNOSIS — J449 Chronic obstructive pulmonary disease, unspecified: Secondary | ICD-10-CM | POA: Diagnosis not present

## 2023-05-10 DIAGNOSIS — L821 Other seborrheic keratosis: Secondary | ICD-10-CM | POA: Diagnosis not present

## 2023-05-27 DIAGNOSIS — I10 Essential (primary) hypertension: Secondary | ICD-10-CM | POA: Diagnosis not present

## 2023-05-27 DIAGNOSIS — I5032 Chronic diastolic (congestive) heart failure: Secondary | ICD-10-CM | POA: Diagnosis not present

## 2023-05-27 DIAGNOSIS — J449 Chronic obstructive pulmonary disease, unspecified: Secondary | ICD-10-CM | POA: Diagnosis not present

## 2023-05-27 DIAGNOSIS — E782 Mixed hyperlipidemia: Secondary | ICD-10-CM | POA: Diagnosis not present

## 2023-05-30 ENCOUNTER — Other Ambulatory Visit: Payer: Self-pay

## 2023-05-30 ENCOUNTER — Emergency Department (HOSPITAL_COMMUNITY)
Admission: EM | Admit: 2023-05-30 | Discharge: 2023-05-30 | Disposition: A | Discharge status: Home / Self Care | Payer: Medicare HMO | Attending: Emergency Medicine | Admitting: Emergency Medicine

## 2023-05-30 ENCOUNTER — Encounter (HOSPITAL_COMMUNITY): Payer: Self-pay

## 2023-05-30 ENCOUNTER — Emergency Department (HOSPITAL_COMMUNITY): Payer: Medicare HMO

## 2023-05-30 DIAGNOSIS — T402X1A Poisoning by other opioids, accidental (unintentional), initial encounter: Secondary | ICD-10-CM | POA: Diagnosis not present

## 2023-05-30 DIAGNOSIS — R079 Chest pain, unspecified: Secondary | ICD-10-CM | POA: Diagnosis not present

## 2023-05-30 DIAGNOSIS — R0902 Hypoxemia: Secondary | ICD-10-CM | POA: Diagnosis not present

## 2023-05-30 DIAGNOSIS — R410 Disorientation, unspecified: Secondary | ICD-10-CM | POA: Diagnosis not present

## 2023-05-30 DIAGNOSIS — Z7902 Long term (current) use of antithrombotics/antiplatelets: Secondary | ICD-10-CM | POA: Diagnosis not present

## 2023-05-30 DIAGNOSIS — T887XXA Unspecified adverse effect of drug or medicament, initial encounter: Secondary | ICD-10-CM | POA: Diagnosis not present

## 2023-05-30 DIAGNOSIS — T40601A Poisoning by unspecified narcotics, accidental (unintentional), initial encounter: Secondary | ICD-10-CM | POA: Insufficient documentation

## 2023-05-30 DIAGNOSIS — I2510 Atherosclerotic heart disease of native coronary artery without angina pectoris: Secondary | ICD-10-CM | POA: Diagnosis not present

## 2023-05-30 DIAGNOSIS — Z7982 Long term (current) use of aspirin: Secondary | ICD-10-CM | POA: Diagnosis not present

## 2023-05-30 DIAGNOSIS — T50904A Poisoning by unspecified drugs, medicaments and biological substances, undetermined, initial encounter: Secondary | ICD-10-CM | POA: Diagnosis not present

## 2023-05-30 DIAGNOSIS — R911 Solitary pulmonary nodule: Secondary | ICD-10-CM | POA: Diagnosis not present

## 2023-05-30 DIAGNOSIS — Z743 Need for continuous supervision: Secondary | ICD-10-CM | POA: Diagnosis not present

## 2023-05-30 LAB — CBC WITH DIFFERENTIAL/PLATELET
Abs Immature Granulocytes: 0.03 10*3/uL (ref 0.00–0.07)
Basophils Absolute: 0 10*3/uL (ref 0.0–0.1)
Basophils Relative: 0 %
Eosinophils Absolute: 0.2 10*3/uL (ref 0.0–0.5)
Eosinophils Relative: 3 %
HCT: 37.3 % — ABNORMAL LOW (ref 39.0–52.0)
Hemoglobin: 12.2 g/dL — ABNORMAL LOW (ref 13.0–17.0)
Immature Granulocytes: 0 %
Lymphocytes Relative: 17 %
Lymphs Abs: 1.4 10*3/uL (ref 0.7–4.0)
MCH: 31.9 pg (ref 26.0–34.0)
MCHC: 32.7 g/dL (ref 30.0–36.0)
MCV: 97.4 fL (ref 80.0–100.0)
Monocytes Absolute: 0.6 10*3/uL (ref 0.1–1.0)
Monocytes Relative: 7 %
Neutro Abs: 6.1 10*3/uL (ref 1.7–7.7)
Neutrophils Relative %: 73 %
Platelets: 168 10*3/uL (ref 150–400)
RBC: 3.83 MIL/uL — ABNORMAL LOW (ref 4.22–5.81)
RDW: 13.4 % (ref 11.5–15.5)
WBC: 8.4 10*3/uL (ref 4.0–10.5)
nRBC: 0 % (ref 0.0–0.2)

## 2023-05-30 LAB — COMPREHENSIVE METABOLIC PANEL
ALT: 13 U/L (ref 0–44)
AST: 16 U/L (ref 15–41)
Albumin: 3.4 g/dL — ABNORMAL LOW (ref 3.5–5.0)
Alkaline Phosphatase: 54 U/L (ref 38–126)
Anion gap: 6 (ref 5–15)
BUN: 19 mg/dL (ref 8–23)
CO2: 29 mmol/L (ref 22–32)
Calcium: 8.6 mg/dL — ABNORMAL LOW (ref 8.9–10.3)
Chloride: 102 mmol/L (ref 98–111)
Creatinine, Ser: 1.24 mg/dL (ref 0.61–1.24)
GFR, Estimated: 57 mL/min — ABNORMAL LOW (ref >60)
Glucose, Bld: 132 mg/dL — ABNORMAL HIGH (ref 70–99)
Potassium: 3.6 mmol/L (ref 3.5–5.1)
Sodium: 137 mmol/L (ref 135–145)
Total Bilirubin: 0.6 mg/dL (ref 0.3–1.2)
Total Protein: 6.2 g/dL — ABNORMAL LOW (ref 6.5–8.1)

## 2023-05-30 LAB — TROPONIN I (HIGH SENSITIVITY)
Troponin I (High Sensitivity): 18 ng/L — ABNORMAL HIGH (ref <18)
Troponin I (High Sensitivity): 19 ng/L — ABNORMAL HIGH (ref <18)

## 2023-05-30 LAB — ETHANOL: Alcohol, Ethyl (B): <10 mg/dL (ref <10)

## 2023-05-30 NOTE — ED Triage Notes (Signed)
Pt family reports pt was very sleepy and slurring words and they discovered 3 oxycodone were missing.  His wife placed 4 oxycodone 10mg  in a nitroglycerin bottle and 3 were missing.  EMS gave 1 mg narcan and pt became alert.

## 2023-05-30 NOTE — Discharge Instructions (Signed)
Do not keep your medicines all in the same bottle.  Follow-up with your doctor if any problems

## 2023-05-30 NOTE — ED Provider Notes (Signed)
[facility] AT [facility] Provider Note   CSN: [id number] Arrival date & time: 05/30/23  1117     History {Add pertinent medical, surgical, social history, OB history to HPI:1} Chief Complaint  Patient presents with   Drug Overdose    Dustin [name] is a 89 y.o. male.  Patient states he had some chest discomfort last night and thought he was taking a nitroglycerin when he ended up taking 3 of his opiates.  When his wife found him he was unresponsive.  Patient was given 1 mg of Narcan and woke up according to the paramedics   Drug Overdose       Home Medications Prior to Admission medications   Medication Sig Start Date End Date Taking? Authorizing Provider  albuterol (PROVENTIL) (2.5 MG/3ML) 0.083% nebulizer solution Take 3 mLs (2.5 mg total) by nebulization every 2 (two) hours as needed for wheezing or shortness of breath. 11/24/22  Yes Emokpae, Courage, MD  albuterol (VENTOLIN HFA) 108 (90 Base) MCG/ACT inhaler Inhale 2 puffs into the lungs every 6 (six) hours as needed for wheezing or shortness of breath. 11/24/22  Yes Emokpae, Courage, MD  alfuzosin (UROXATRAL) 10 MG 24 hr tablet TAKE 1 TABLET DAILY WITH BREAKFAST. 05/10/23  Yes McKenzie, Mardene Celeste, MD  aspirin EC 81 MG tablet Take 1 tablet (81 mg total) by mouth daily. Swallow whole. 02/14/23  Yes Sharlene Dory, NP  clopidogrel (PLAVIX) 75 MG tablet Take 1 tablet (75 mg total) by mouth daily. 11/24/22  Yes Emokpae, Courage, MD  docusate sodium (COLACE) 100 MG capsule Take by mouth as directed. 03/31/23  Yes [provider]  DULoxetine (CYMBALTA) 30 MG capsule Take 30 mg by mouth daily. 05/10/23  Yes [provider]  dutasteride (AVODART) 0.5 MG capsule TAKE ONE CAPSULE BY MOUTH ONCE DAILY IN THE AFTERNOON. 04/04/23  Yes McKenzie, Mardene Celeste, MD  escitalopram (LEXAPRO) 20 MG tablet Take 20 mg by mouth every morning.    Yes [provider]  furosemide (LASIX) 20 MG tablet  Take 1 tablet (20 mg total) by mouth 2 (two) times daily. For fluid 11/24/22  Yes Emokpae, Courage, MD  gabapentin (NEURONTIN) 100 MG capsule Take 100 mg by mouth 3 (three) times daily. 03/04/23  Yes [provider]  isosorbide mononitrate (IMDUR) 120 MG 24 hr tablet Take 1 tablet (120 mg total) by mouth every morning. 11/24/22  Yes Emokpae, Courage, MD  levothyroxine (SYNTHROID) 75 MCG tablet Take 75 mcg by mouth daily. 02/23/22  Yes [provider]  memantine (NAMENDA) 10 MG tablet Take 10 mg by mouth 2 (two) times daily.  03/05/16  Yes [provider]  nitroGLYCERIN (NITROSTAT) 0.4 MG SL tablet Place 1 tablet (0.4 mg total) under the tongue every 5 (five) minutes as needed for chest pain. 11/24/22  Yes Emokpae, Courage, MD  oxycodone (ROXICODONE) 30 MG immediate release tablet Take 30 mg by mouth every 6 (six) hours as needed for pain. 09/02/21  Yes [provider]  potassium chloride SA (KLOR-CON M) 20 MEQ tablet Take 1 tablet (20 mEq total) by mouth daily. 11/24/22  Yes Emokpae, Courage, MD  QUEtiapine (SEROQUEL) 25 MG tablet Take 25 mg by mouth at bedtime as needed (sleep). 11/19/19  Yes [provider]  ranolazine (RANEXA) 1000 MG SR tablet Take 1 tablet (1,000 mg total) by mouth 2 (two) times daily. 11/24/22  Yes Emokpae, Courage, MD  risperiDONE (RISPERDAL) 0.25 MG tablet Take 0.25 mg by mouth at bedtime.  03/18/23  Yes [provider]  rosuvastatin (CRESTOR) 40 MG tablet Take 1 tablet (40 mg total) by mouth daily at 6 PM. 11/24/22  Yes Emokpae, Courage, MD  Budeson-Glycopyrrol-Formoterol (BREZTRI AEROSPHERE) 160-9-4.8 MCG/ACT AERO Inhale 2 puffs into the lungs in the morning and at bedtime. 03/16/23   Coralyn Helling, MD  guaiFENesin (MUCINEX) 600 MG 12 hr tablet Take 1 tablet (600 mg total) by mouth 2 (two) times daily. 11/24/22   Shon Hale, MD  pantoprazole (PROTONIX) 40 MG tablet Take 1 tablet (40 mg total) by mouth 2 (two) times daily for  14 days, THEN 1 tablet (40 mg total) daily. 02/14/23 02/28/24  Sharlene Dory, NP      Allergies    Patient has no known allergies.    Review of Systems   Review of Systems  Physical Exam Updated Vital Signs BP (!) 120/52   Pulse (!) 57   Temp 98.2 F (36.8 C) (Oral)   Resp 13   Ht 5\' 10"  (1.778 m)   Wt 97.2 kg   SpO2 93%   BMI 30.75 kg/m  Physical Exam  ED Results / Procedures / Treatments   Labs (all labs ordered are listed, but only abnormal results are displayed) Labs Reviewed  CBC WITH DIFFERENTIAL/PLATELET - Abnormal; Notable for the following components:      Result Value   RBC 3.83 (*)    Hemoglobin 12.2 (*)    HCT 37.3 (*)    All other components within normal limits  COMPREHENSIVE METABOLIC PANEL - Abnormal; Notable for the following components:   Glucose, Bld 132 (*)    Calcium 8.6 (*)    Total Protein 6.2 (*)    Albumin 3.4 (*)    GFR, Estimated 57 (*)    All other components within normal limits  TROPONIN I (HIGH SENSITIVITY) - Abnormal; Notable for the following components:   Troponin I (High Sensitivity) 18 (*)    All other components within normal limits  TROPONIN I (HIGH SENSITIVITY) - Abnormal; Notable for the following components:   Troponin I (High Sensitivity) 19 (*)    All other components within normal limits  ETHANOL  URINALYSIS, ROUTINE W REFLEX MICROSCOPIC  RAPID URINE DRUG SCREEN, HOSP PERFORMED    EKG None  Radiology DG Chest Port 1 View  Result Date: 05/30/2023 CLINICAL DATA:  Pain EXAM: PORTABLE CHEST 1 VIEW COMPARISON:  X-ray 12/23/2022 FINDINGS: Status post median sternotomy. Stable cardiopericardial silhouette. No consolidation, pneumothorax or effusion. No edema. Overlapping cardiac leads. The right inferior costophrenic angle is clipped off the edge of the film. Osteopenia and scattered degenerative changes. Left-sided lung nodule is not well appreciated on this portable upright exam. Please correlate with prior workup  IMPRESSION: Postop chest.  No acute cardiopulmonary disease. The previous left-sided lung nodule is not well appreciated on this portable exam. Please correlate with prior workup Electronically Signed   By: Karen Kays M.D.   On: 05/30/2023 12:00    Procedures Procedures  {Document cardiac monitor, telemetry assessment procedure when appropriate:1}  Medications Ordered in ED Medications - No data to display  ED Course/ Medical Decision Making/ A&P  Patient with an accidental overdose of narcotics.  He has been observed over 4 hours from the time he got his Narcan and is awake and alert. {   Click here for ABCD2, HEART and other calculatorsREFRESH Note before signing :1}  Medical Decision Making Amount and/or Complexity of Data Reviewed Labs: ordered. Radiology: ordered.   Accidental opiate overdose.  Patient will follow-up with his PCP  {Document critical care time when appropriate:1} {Document review of labs and clinical decision tools ie heart score, Chads2Vasc2 etc:1}  {Document your independent review of radiology images, and any outside records:1} {Document your discussion with family members, caretakers, and with consultants:1} {Document social determinants of health affecting pt's care:1} {Document your decision making why or why not admission, treatments were needed:1} Final Clinical Impression(s) / ED Diagnoses Final diagnoses:  Opiate overdose, accidental or unintentional, initial encounter [facility])    Rx / DC Orders ED Discharge Orders     None

## 2023-06-02 ENCOUNTER — Ambulatory Visit: Payer: Self-pay | Admitting: Radiation Oncology

## 2023-06-03 ENCOUNTER — Telehealth: Payer: Self-pay

## 2023-06-03 NOTE — Telephone Encounter (Signed)
Transition Care Management Unsuccessful Follow-up Telephone Call  Date of discharge and from where:  [name] 6/[facility]  Attempts:  1st Attempt  Reason for unsuccessful TCM follow-up call:  Left voice message   Lenard Forth [facility] Guide, [facility] [phone] [address] [place], [place], [place] [address] Phone: [phone] Email: [email or web address]

## 2023-06-03 NOTE — Telephone Encounter (Signed)
Transition Care Management Follow-up Telephone Call Date of discharge and from where: [name] 6/3 How have you been since you were released from the hospital? Doing ok  Any questions or concerns? No  Items Reviewed: Did the pt receive and understand the discharge instructions provided? Yes  Medications obtained and verified? No  Other? No  Any new allergies since your discharge? Yes  Dietary orders reviewed? No Do you have support at home? Yes    Follow up appointments reviewed:  PCP Hospital f/u appt confirmed? Yes  Scheduled to see  on  @ . Specialist Hospital f/u appt confirmed? No  Scheduled to see  on  @ . Are transportation arrangements needed? No  If their condition worsens, is the pt aware to call PCP or go to the Emergency Dept.? Yes Was the patient provided with contact information for the PCP's office or ED? Yes Was to pt encouraged to call back with questions or concerns? Yes

## 2023-06-09 DIAGNOSIS — I5032 Chronic diastolic (congestive) heart failure: Secondary | ICD-10-CM | POA: Diagnosis not present

## 2023-06-09 DIAGNOSIS — M48061 Spinal stenosis, lumbar region without neurogenic claudication: Secondary | ICD-10-CM | POA: Diagnosis not present

## 2023-06-09 DIAGNOSIS — G894 Chronic pain syndrome: Secondary | ICD-10-CM | POA: Diagnosis not present

## 2023-06-09 DIAGNOSIS — Z6830 Body mass index (BMI) 30.0-30.9, adult: Secondary | ICD-10-CM | POA: Diagnosis not present

## 2023-06-09 DIAGNOSIS — J449 Chronic obstructive pulmonary disease, unspecified: Secondary | ICD-10-CM | POA: Diagnosis not present

## 2023-06-09 DIAGNOSIS — E6609 Other obesity due to excess calories: Secondary | ICD-10-CM | POA: Diagnosis not present

## 2023-06-09 DIAGNOSIS — F0280 Dementia in other diseases classified elsewhere without behavioral disturbance: Secondary | ICD-10-CM | POA: Diagnosis not present

## 2023-06-09 DIAGNOSIS — I209 Angina pectoris, unspecified: Secondary | ICD-10-CM | POA: Diagnosis not present

## 2023-06-17 ENCOUNTER — Encounter: Payer: Self-pay | Admitting: Radiation Oncology

## 2023-06-17 NOTE — Progress Notes (Signed)
  Radiation Oncology         [phone] ________________________________  Name: Dustin [name] MRN: [id number]  Date: 06/20/2023  DOB: 1934-07-21  End of Treatment Note  Diagnosis: The encounter diagnosis was Primary malignant neoplasm of left lower lobe of lung.   PET positive solitary lung nodule in the left lower lung   Presumptive clinical Stage I non-small cell lung cancer     Indication for treatment: Curative        Radiation treatment dates: 04/26/23 through 05/02/23  Site/dose: Left Lung - 54 Gy delivered in 3 Fx at 18 Gy/Fx  Beams/energy:  6X-FFF  Technique/Mode: SBRT/SRT-IMRT, Photon   Narrative: The patient tolerated radiation treatment relatively well. On the date of his final treatment, the patient endorsed some pain, fatigue, and shortness of breath. He denied any other concerns and denied any changes in his breathing or cough.  Plan: The patient has completed radiation treatment. The patient will return to radiation oncology clinic for routine followup in one month. I advised them to call or return sooner if they have any questions or concerns related to their recovery or treatment.  -----------------------------------  Billie Lade, PhD, MD  This document serves as a record of services personally performed by Antony Blackbird, MD. It was created on his behalf by Neena Rhymes, a trained medical scribe. The creation of this record is based on the scribe's personal observations and the provider's statements to them. This document has been checked and approved by the attending provider.

## 2023-06-18 NOTE — Progress Notes (Signed)
Radiation Oncology         [phone] ________________________________  Name: Dustin [name] MRN: [id number]  Date: 06/20/2023  DOB: April 04, 1934  Follow-Up Visit Note  CC: [name], MD  [name], MD  No diagnosis found.  Diagnosis: The encounter diagnosis was Primary malignant neoplasm of left lower lobe of lung.   PET positive solitary lung nodule in the left lower lung   Presumptive clinical Stage I non-small cell lung cancer    Interval Since Last Radiation: 1 month and 18 days   Indication for treatment: Curative       Radiation treatment dates: 04/26/23 through 05/02/23 Site/dose: Left Lung - 54 Gy delivered in 3 Fx at 18 Gy/Fx Beams/energy:  6X-FFF Technique/Mode: SBRT/SRT-IMRT, Photon   Narrative:  The patient returns today for routine follow-up. The patient tolerated radiation treatment relatively well. On the date of his final treatment, the patient endorsed some pain, fatigue, and shortness of breath. He denied any other concerns and denied any changes in his breathing or cough.   Since completing radiation therapy, the patient presented to the ED on 05/30/23 due to an accidental opiate overdose. This occurred after her developed some chest discomfort the night prior and accidentally took 3 of his prescription opiates instead of a nitroglycerin tablet. His wife then contacted EMT after findings his unresponsive and paramedics gave him 1 mg of Narcan. His symptoms improved upon arrival to the ED. (ED work-up performed included a chest x-ray which which showed no acute findings).                              Allergies:  has No Known Allergies.  Meds: Current Outpatient Medications  Medication Sig Dispense Refill   albuterol (PROVENTIL) (2.5 MG/3ML) 0.083% nebulizer solution Take 3 mLs (2.5 mg total) by nebulization every 2 (two) hours as needed for wheezing or shortness of breath. 75 mL 3   albuterol (VENTOLIN HFA) 108 (90 Base) MCG/ACT inhaler Inhale 2  puffs into the lungs every 6 (six) hours as needed for wheezing or shortness of breath. 18 g 3   alfuzosin (UROXATRAL) 10 MG 24 hr tablet TAKE 1 TABLET DAILY WITH BREAKFAST. 30 tablet 0   aspirin EC 81 MG tablet Take 1 tablet (81 mg total) by mouth daily. Swallow whole. 90 tablet 3   Budeson-Glycopyrrol-Formoterol (BREZTRI AEROSPHERE) 160-9-4.8 MCG/ACT AERO Inhale 2 puffs into the lungs in the morning and at bedtime. 10.7 g 5   clopidogrel (PLAVIX) 75 MG tablet Take 1 tablet (75 mg total) by mouth daily. 30 tablet 11   docusate sodium (COLACE) 100 MG capsule Take by mouth as directed.     DULoxetine (CYMBALTA) 30 MG capsule Take 30 mg by mouth daily.     dutasteride (AVODART) 0.5 MG capsule TAKE ONE CAPSULE BY MOUTH ONCE DAILY IN THE AFTERNOON. 30 capsule 0   escitalopram (LEXAPRO) 20 MG tablet Take 20 mg by mouth every morning.      furosemide (LASIX) 20 MG tablet Take 1 tablet (20 mg total) by mouth 2 (two) times daily. For fluid 60 tablet 5   gabapentin (NEURONTIN) 100 MG capsule Take 100 mg by mouth 3 (three) times daily.     guaiFENesin (MUCINEX) 600 MG 12 hr tablet Take 1 tablet (600 mg total) by mouth 2 (two) times daily. 20 tablet 0   isosorbide mononitrate (IMDUR) 120 MG 24 hr tablet Take 1 tablet (120 mg total) by  mouth every morning. 30 tablet 11   levothyroxine (SYNTHROID) 75 MCG tablet Take 75 mcg by mouth daily.     memantine (NAMENDA) 10 MG tablet Take 10 mg by mouth 2 (two) times daily.      nitroGLYCERIN (NITROSTAT) 0.4 MG SL tablet Place 1 tablet (0.4 mg total) under the tongue every 5 (five) minutes as needed for chest pain. 15 tablet 5   oxycodone (ROXICODONE) 30 MG immediate release tablet Take 30 mg by mouth every 6 (six) hours as needed for pain.     pantoprazole (PROTONIX) 40 MG tablet Take 1 tablet (40 mg total) by mouth 2 (two) times daily for 14 days, THEN 1 tablet (40 mg total) daily. 388 tablet 0   potassium chloride SA (KLOR-CON M) 20 MEQ tablet Take 1 tablet (20 mEq  total) by mouth daily. 30 tablet 3   QUEtiapine (SEROQUEL) 25 MG tablet Take 25 mg by mouth at bedtime as needed (sleep).     ranolazine (RANEXA) 1000 MG SR tablet Take 1 tablet (1,000 mg total) by mouth 2 (two) times daily. 60 tablet 5   risperiDONE (RISPERDAL) 0.25 MG tablet Take 0.25 mg by mouth at bedtime.     rosuvastatin (CRESTOR) 40 MG tablet Take 1 tablet (40 mg total) by mouth daily at 6 PM. 30 tablet 5   No current facility-administered medications for this encounter.    Physical Findings: The patient is in no acute distress. Patient is alert and oriented.  vitals were not taken for this visit. .  No significant changes. Lungs are clear to auscultation bilaterally. Heart has regular rate and rhythm. No palpable cervical, supraclavicular, or axillary adenopathy. Abdomen soft, non-tender, normal bowel sounds.   Lab Findings: Lab Results  Component Value Date   WBC 8.4 05/30/2023   HGB 12.2 (L) 05/30/2023   HCT 37.3 (L) 05/30/2023   MCV 97.4 05/30/2023   PLT 168 05/30/2023    Radiographic Findings: DG Chest Port 1 View  Result Date: 05/30/2023 CLINICAL DATA:  Pain EXAM: PORTABLE CHEST 1 VIEW COMPARISON:  X-ray 12/23/2022 FINDINGS: Status post median sternotomy. Stable cardiopericardial silhouette. No consolidation, pneumothorax or effusion. No edema. Overlapping cardiac leads. The right inferior costophrenic angle is clipped off the edge of the film. Osteopenia and scattered degenerative changes. Left-sided lung nodule is not well appreciated on this portable upright exam. Please correlate with prior workup IMPRESSION: Postop chest.  No acute cardiopulmonary disease. The previous left-sided lung nodule is not well appreciated on this portable exam. Please correlate with prior workup Electronically Signed   By: Karen Kays M.D.   On: 05/30/2023 12:00    Impression: The encounter diagnosis was Primary malignant neoplasm of left lower lobe of lung.   PET positive solitary lung  nodule in the left lower lung   Presumptive clinical Stage I non-small cell lung cancer     The patient is recovering from the effects of radiation.  ***  Plan:  ***   *** minutes of total time was spent for this patient encounter, including preparation, face-to-face counseling with the patient and coordination of care, physical exam, and documentation of the encounter. ____________________________________  Billie Lade, PhD, MD  This document serves as a record of services personally performed by Antony Blackbird, MD. It was created on his behalf by Neena Rhymes, a trained medical scribe. The creation of this record is based on the scribe's personal observations and the provider's statements to them. This document has been checked and approved by  the attending provider.

## 2023-06-20 ENCOUNTER — Other Ambulatory Visit: Payer: Self-pay

## 2023-06-20 ENCOUNTER — Encounter: Payer: Self-pay | Admitting: Radiation Oncology

## 2023-06-20 ENCOUNTER — Ambulatory Visit
Admission: RE | Admit: 2023-06-20 | Discharge: 2023-06-20 | Disposition: A | Discharge status: Home / Self Care | Payer: Medicare HMO | Source: Ambulatory Visit | Attending: Radiation Oncology | Admitting: Radiation Oncology

## 2023-06-20 VITALS — BP 129/58 | HR 56 | Temp 97.0°F | Resp 20 | Ht 70.0 in | Wt 212.4 lb

## 2023-06-20 DIAGNOSIS — Z7982 Long term (current) use of aspirin: Secondary | ICD-10-CM | POA: Insufficient documentation

## 2023-06-20 DIAGNOSIS — Z79890 Hormone replacement therapy (postmenopausal): Secondary | ICD-10-CM | POA: Insufficient documentation

## 2023-06-20 DIAGNOSIS — R918 Other nonspecific abnormal finding of lung field: Secondary | ICD-10-CM | POA: Insufficient documentation

## 2023-06-20 DIAGNOSIS — C3432 Malignant neoplasm of lower lobe, left bronchus or lung: Secondary | ICD-10-CM

## 2023-06-20 DIAGNOSIS — Z79899 Other long term (current) drug therapy: Secondary | ICD-10-CM | POA: Insufficient documentation

## 2023-06-20 DIAGNOSIS — M8580 Other specified disorders of bone density and structure, unspecified site: Secondary | ICD-10-CM | POA: Diagnosis not present

## 2023-06-20 DIAGNOSIS — Z7902 Long term (current) use of antithrombotics/antiplatelets: Secondary | ICD-10-CM | POA: Diagnosis not present

## 2023-06-20 DIAGNOSIS — Z923 Personal history of irradiation: Secondary | ICD-10-CM | POA: Insufficient documentation

## 2023-06-20 HISTORY — DX: Personal history of irradiation: Z92.3

## 2023-06-20 NOTE — Progress Notes (Signed)
Dustin [name] is here today for follow up post radiation to the lung.  Lung Side: left lung 05/02/23  Does the patient complain of any of the following: Pain:denies any pain  at radiation Shortness of breath w/wo exertion: With activity  and at rest Cough: none Hemoptysis: none Pain with swallowing: no Swallowing/choking concerns: yes Appetite: good Energy Level: moderate fatigue Post radiation skin Changes: no issues Vitals:   06/20/23 1126  BP: (!) 129/58  Pulse: (!) 56  Resp: 20  Temp: (!) 97 F (36.1 C)  SpO2: 97%  Weight: 96.3 kg  Height: 5\' 10"  (1.778 m)       Additional comments if applicable:

## 2023-07-05 ENCOUNTER — Ambulatory Visit: Payer: Medicare HMO | Admitting: Pulmonary Disease

## 2023-07-05 ENCOUNTER — Encounter: Payer: Self-pay | Admitting: Pulmonary Disease

## 2023-07-05 VITALS — BP 105/64 | HR 55 | Ht 69.0 in | Wt 213.6 lb

## 2023-07-05 DIAGNOSIS — J4489 Other specified chronic obstructive pulmonary disease: Secondary | ICD-10-CM | POA: Diagnosis not present

## 2023-07-05 DIAGNOSIS — J9611 Chronic respiratory failure with hypoxia: Secondary | ICD-10-CM | POA: Diagnosis not present

## 2023-07-05 NOTE — Progress Notes (Signed)
[facility]  Chief Complaint  Patient presents with   Follow-up    Constitutional:  BP 105/64   Pulse (!) 55   Ht 5\' 9"  (1.753 m)   Wt 213 lb 9.6 oz (96.9 kg)   SpO2 90%   BMI 31.54 kg/m   Past Medical History:  Alzheimer's disease, Anxiety, Gastritis, CAD, DDD, BPH, HTN, Hypothyroidism, LBBB, HLD, COVID 19 PNA February 2021, PNA September 2015 and MAy 2022  Past Surgical History:  He  has a past surgical history that includes Coronary artery bypass graft (1996); Hernia repair; Coronary angioplasty with stent; Colonoscopy (08/03/2004); Colonoscopy (07/12/2012); Esophagogastroduodenoscopy (N/A, 03/16/2013); Esophagogastroduodenoscopy (N/A, 03/06/2013); Colonoscopy (N/A, 06/22/2016); LEFT HEART CATH AND CORS/GRAFTS ANGIOGRAPHY (N/A, 12/10/2019); CORONARY STENT INTERVENTION (N/A, 12/10/2019); Coronary Ultrasound/IVUS (N/A, 12/10/2019); LEFT HEART CATH AND CORS/GRAFTS ANGIOGRAPHY (N/A, 03/02/2021); and Cataract extraction w/PHACO (Left, 03/22/2022).  Brief Summary:  Dustin [name] is a 89 y.o. male former smoker with COPD and lung nodule.      Subjective:   He is here with his wife.  Started on XRT with Dr. Roselind Messier.  He has trouble with his balance and walking from neuropathy.  Gets winded when walking also.  Gets a cough with clear sputum, especially in the morning.  Has trouble with his breathing until he is able to clear the phlegm.  No having wheeze, chest pain, hemoptysis, fever, or leg swelling.  Lowest SpO2 on room air while walking was 88%, and he could only walk 1.5 laps.  He was recovered on 2 liters oxygen to keep SpO2 > 92%.  Physical Exam:   Appearance - well kempt   ENMT - no sinus tenderness, no oral exudate, no LAN, Mallampati 3 airway, no stridor, decreased hearing acuity  Respiratory - decreased breath sounds bilaterally, no wheezing or rales  CV - s1s2 regular rate and rhythm, no murmurs  Ext - no clubbing, no  edema  Skin - no rashes  Psych - normal mood and affect      Pulmonary testing:    Chest Imaging:  CT angio chest 09/21/20 >> atherosclerosis CT chest 12/29/22 >> coronary calcification, mild emphysema, 1.6 x 1.5 x 0.7 cm nodule LLL PET scan 03/24/23 >> 1.8 cm nodule superior segment LLL with 4.63 SUV (was 1.5 cm on CT chest from 12/28/22)   Cardiac Tests:  Echo 11/23/22 >> EF 60 to 65%, mild LVH, mild MR  Social History:  He  reports that he quit smoking about 28 years ago. His smoking use included cigarettes. He has a 80.00 pack-year smoking history. He has never used smokeless tobacco. He reports that he does not drink alcohol and does not use drugs.  Family History:  His family history includes Heart attack in his mother; Heart disease in an other family member.     Assessment/Plan:   Lt lower lobe lung nodule presumably from Stage 1 lung cancer. - s/p SBRT with Dr. Roselind Messier - follow up imaging through radiation oncology  COPD. - breztri two puffs bid - prn albuterol - he has a nebulizer - prn mucinex - will arrange for a flutter valve - defer PFT for now since results not likely to change management at this point  Chronic respiratory failure with hypoxia. - will arrange for 2 liters oxygen with exertion and sleep - will arrange for a POC  Time Spent Involved in Patient Care on Day of Examination:  37 minutes  Follow up:   Patient Instructions  Try  using mucinex to help with your cough and chest congestion.  Will arrange for a flutter valve.  Uses this twice per day as needed to help with chest congestion and cough.  Will arrange for home oxygen set up at 2 liters with activity and with sleep.  Follow up in 3 month  Medication List:   Allergies as of 07/05/2023   No Known Allergies      Medication List        Accurate as of July 05, 2023  2:39 PM. If you have any questions, ask your nurse or doctor.          albuterol (2.5 MG/3ML) 0.083%  nebulizer solution Commonly known as: PROVENTIL Take 3 mLs (2.5 mg total) by nebulization every 2 (two) hours as needed for wheezing or shortness of breath.   albuterol 108 (90 Base) MCG/ACT inhaler Commonly known as: VENTOLIN HFA Inhale 2 puffs into the lungs every 6 (six) hours as needed for wheezing or shortness of breath.   alfuzosin 10 MG 24 hr tablet Commonly known as: UROXATRAL TAKE 1 TABLET DAILY WITH BREAKFAST.   aspirin EC 81 MG tablet Take 1 tablet (81 mg total) by mouth daily. Swallow whole.   Breztri Aerosphere 160-9-4.8 MCG/ACT Aero Generic drug: Budeson-Glycopyrrol-Formoterol Inhale 2 puffs into the lungs in the morning and at bedtime.   clopidogrel 75 MG tablet Commonly known as: PLAVIX Take 1 tablet (75 mg total) by mouth daily.   docusate sodium 100 MG capsule Commonly known as: COLACE Take by mouth as directed.   DULoxetine 30 MG capsule Commonly known as: CYMBALTA Take 30 mg by mouth daily.   dutasteride 0.5 MG capsule Commonly known as: AVODART TAKE ONE CAPSULE BY MOUTH ONCE DAILY IN THE AFTERNOON.   escitalopram 20 MG tablet Commonly known as: LEXAPRO Take 20 mg by mouth every morning.   furosemide 20 MG tablet Commonly known as: LASIX Take 1 tablet (20 mg total) by mouth 2 (two) times daily. For fluid   gabapentin 100 MG capsule Commonly known as: NEURONTIN Take 100 mg by mouth 3 (three) times daily.   guaiFENesin 600 MG 12 hr tablet Commonly known as: MUCINEX Take 1 tablet (600 mg total) by mouth 2 (two) times daily.   isosorbide mononitrate 120 MG 24 hr tablet Commonly known as: IMDUR Take 1 tablet (120 mg total) by mouth every morning.   levothyroxine 75 MCG tablet Commonly known as: SYNTHROID Take 75 mcg by mouth daily.   memantine 10 MG tablet Commonly known as: NAMENDA Take 10 mg by mouth 2 (two) times daily.   nitroGLYCERIN 0.4 MG SL tablet Commonly known as: NITROSTAT Place 1 tablet (0.4 mg total) under the tongue every 5  (five) minutes as needed for chest pain.   oxycodone 30 MG immediate release tablet Commonly known as: ROXICODONE Take 30 mg by mouth every 6 (six) hours as needed for pain.   pantoprazole 40 MG tablet Commonly known as: Protonix Take 1 tablet (40 mg total) by mouth 2 (two) times daily for 14 days, THEN 1 tablet (40 mg total) daily. Start taking on: February 14, 2023   potassium chloride SA 20 MEQ tablet Commonly known as: KLOR-CON M Take 1 tablet (20 mEq total) by mouth daily.   QUEtiapine 25 MG tablet Commonly known as: SEROQUEL Take 25 mg by mouth at bedtime as needed (sleep).   ranolazine 1000 MG SR tablet Commonly known as: Ranexa Take 1 tablet (1,000 mg total) by mouth 2 (two) times  daily.   risperiDONE 0.25 MG tablet Commonly known as: RISPERDAL Take 0.25 mg by mouth at bedtime.   rosuvastatin 40 MG tablet Commonly known as: CRESTOR Take 1 tablet (40 mg total) by mouth daily at 6 PM.        Signature:  Coralyn Helling, MD [facility] Pager - [phone] 07/05/2023, 2:39 PM

## 2023-07-05 NOTE — Patient Instructions (Addendum)
Try using mucinex to help with your cough and chest congestion.  Will arrange for a flutter valve.  Uses this twice per day as needed to help with chest congestion and cough.  Will arrange for home oxygen set up at 2 liters with activity and with sleep.  Follow up in 3 month

## 2023-07-06 ENCOUNTER — Telehealth: Payer: Self-pay | Admitting: Pulmonary Disease

## 2023-07-06 DIAGNOSIS — J449 Chronic obstructive pulmonary disease, unspecified: Secondary | ICD-10-CM

## 2023-07-06 NOTE — Telephone Encounter (Signed)
Dustin [name] states needs oxygen order to go on template. Dustin [name] phone number is [phone].

## 2023-07-07 DIAGNOSIS — E6609 Other obesity due to excess calories: Secondary | ICD-10-CM | POA: Diagnosis not present

## 2023-07-07 DIAGNOSIS — R911 Solitary pulmonary nodule: Secondary | ICD-10-CM | POA: Diagnosis not present

## 2023-07-07 DIAGNOSIS — G894 Chronic pain syndrome: Secondary | ICD-10-CM | POA: Diagnosis not present

## 2023-07-07 DIAGNOSIS — I5022 Chronic systolic (congestive) heart failure: Secondary | ICD-10-CM | POA: Diagnosis not present

## 2023-07-07 DIAGNOSIS — I209 Angina pectoris, unspecified: Secondary | ICD-10-CM | POA: Diagnosis not present

## 2023-07-07 DIAGNOSIS — Z6830 Body mass index (BMI) 30.0-30.9, adult: Secondary | ICD-10-CM | POA: Diagnosis not present

## 2023-07-07 DIAGNOSIS — J449 Chronic obstructive pulmonary disease, unspecified: Secondary | ICD-10-CM | POA: Diagnosis not present

## 2023-07-07 DIAGNOSIS — G309 Alzheimer's disease, unspecified: Secondary | ICD-10-CM | POA: Diagnosis not present

## 2023-07-07 DIAGNOSIS — M48061 Spinal stenosis, lumbar region without neurogenic claudication: Secondary | ICD-10-CM | POA: Diagnosis not present

## 2023-07-13 NOTE — Telephone Encounter (Signed)
Order has been placed on template.

## 2023-07-20 NOTE — Telephone Encounter (Signed)
[employer] called to inform the doctor that the patient has a balance that needs to be fulfilled before the order can be processed.  He stated that the order has been put on hold and that the patient was informed.  If there are any other questions, please contact Tinnie Gens at [phone]

## 2023-07-29 ENCOUNTER — Encounter (HOSPITAL_COMMUNITY): Payer: Self-pay

## 2023-07-29 ENCOUNTER — Inpatient Hospital Stay (HOSPITAL_COMMUNITY)
Admission: EM | Admit: 2023-07-29 | Discharge: 2023-07-30 | DRG: 177 | Disposition: A | Discharge status: Home / Self Care | Payer: Medicare HMO | Attending: Family Medicine | Admitting: Family Medicine

## 2023-07-29 ENCOUNTER — Emergency Department (HOSPITAL_COMMUNITY): Payer: Medicare HMO

## 2023-07-29 DIAGNOSIS — I2489 Other forms of acute ischemic heart disease: Secondary | ICD-10-CM | POA: Diagnosis present

## 2023-07-29 DIAGNOSIS — F0283 Dementia in other diseases classified elsewhere, unspecified severity, with mood disturbance: Secondary | ICD-10-CM | POA: Diagnosis present

## 2023-07-29 DIAGNOSIS — Z955 Presence of coronary angioplasty implant and graft: Secondary | ICD-10-CM | POA: Diagnosis not present

## 2023-07-29 DIAGNOSIS — C3432 Malignant neoplasm of lower lobe, left bronchus or lung: Secondary | ICD-10-CM | POA: Diagnosis present

## 2023-07-29 DIAGNOSIS — Z8249 Family history of ischemic heart disease and other diseases of the circulatory system: Secondary | ICD-10-CM

## 2023-07-29 DIAGNOSIS — Z79899 Other long term (current) drug therapy: Secondary | ICD-10-CM

## 2023-07-29 DIAGNOSIS — J449 Chronic obstructive pulmonary disease, unspecified: Secondary | ICD-10-CM | POA: Diagnosis present

## 2023-07-29 DIAGNOSIS — Z87891 Personal history of nicotine dependence: Secondary | ICD-10-CM | POA: Diagnosis not present

## 2023-07-29 DIAGNOSIS — E782 Mixed hyperlipidemia: Secondary | ICD-10-CM | POA: Diagnosis present

## 2023-07-29 DIAGNOSIS — R0602 Shortness of breath: Secondary | ICD-10-CM | POA: Diagnosis not present

## 2023-07-29 DIAGNOSIS — J189 Pneumonia, unspecified organism: Secondary | ICD-10-CM

## 2023-07-29 DIAGNOSIS — Z79890 Hormone replacement therapy (postmenopausal): Secondary | ICD-10-CM | POA: Diagnosis not present

## 2023-07-29 DIAGNOSIS — F32A Depression, unspecified: Secondary | ICD-10-CM | POA: Diagnosis not present

## 2023-07-29 DIAGNOSIS — G309 Alzheimer's disease, unspecified: Secondary | ICD-10-CM | POA: Diagnosis present

## 2023-07-29 DIAGNOSIS — R071 Chest pain on breathing: Secondary | ICD-10-CM | POA: Diagnosis not present

## 2023-07-29 DIAGNOSIS — J439 Emphysema, unspecified: Secondary | ICD-10-CM | POA: Diagnosis present

## 2023-07-29 DIAGNOSIS — I25110 Atherosclerotic heart disease of native coronary artery with unstable angina pectoris: Secondary | ICD-10-CM | POA: Diagnosis not present

## 2023-07-29 DIAGNOSIS — Z7982 Long term (current) use of aspirin: Secondary | ICD-10-CM

## 2023-07-29 DIAGNOSIS — G8929 Other chronic pain: Secondary | ICD-10-CM | POA: Diagnosis present

## 2023-07-29 DIAGNOSIS — J9621 Acute and chronic respiratory failure with hypoxia: Secondary | ICD-10-CM | POA: Diagnosis present

## 2023-07-29 DIAGNOSIS — Z923 Personal history of irradiation: Secondary | ICD-10-CM

## 2023-07-29 DIAGNOSIS — I252 Old myocardial infarction: Secondary | ICD-10-CM

## 2023-07-29 DIAGNOSIS — T17908A Unspecified foreign body in respiratory tract, part unspecified causing other injury, initial encounter: Secondary | ICD-10-CM | POA: Diagnosis not present

## 2023-07-29 DIAGNOSIS — R7989 Other specified abnormal findings of blood chemistry: Secondary | ICD-10-CM | POA: Insufficient documentation

## 2023-07-29 DIAGNOSIS — E039 Hypothyroidism, unspecified: Secondary | ICD-10-CM | POA: Diagnosis not present

## 2023-07-29 DIAGNOSIS — J168 Pneumonia due to other specified infectious organisms: Secondary | ICD-10-CM | POA: Diagnosis not present

## 2023-07-29 DIAGNOSIS — I10 Essential (primary) hypertension: Secondary | ICD-10-CM | POA: Diagnosis present

## 2023-07-29 DIAGNOSIS — G4733 Obstructive sleep apnea (adult) (pediatric): Secondary | ICD-10-CM | POA: Diagnosis present

## 2023-07-29 DIAGNOSIS — R918 Other nonspecific abnormal finding of lung field: Secondary | ICD-10-CM | POA: Diagnosis not present

## 2023-07-29 DIAGNOSIS — E785 Hyperlipidemia, unspecified: Secondary | ICD-10-CM | POA: Diagnosis present

## 2023-07-29 DIAGNOSIS — Z7902 Long term (current) use of antithrombotics/antiplatelets: Secondary | ICD-10-CM | POA: Diagnosis not present

## 2023-07-29 DIAGNOSIS — I2510 Atherosclerotic heart disease of native coronary artery without angina pectoris: Secondary | ICD-10-CM | POA: Diagnosis present

## 2023-07-29 DIAGNOSIS — J9601 Acute respiratory failure with hypoxia: Secondary | ICD-10-CM | POA: Diagnosis not present

## 2023-07-29 DIAGNOSIS — J690 Pneumonitis due to inhalation of food and vomit: Principal | ICD-10-CM | POA: Diagnosis present

## 2023-07-29 DIAGNOSIS — Z8616 Personal history of COVID-19: Secondary | ICD-10-CM

## 2023-07-29 DIAGNOSIS — J432 Centrilobular emphysema: Secondary | ICD-10-CM | POA: Diagnosis not present

## 2023-07-29 DIAGNOSIS — R911 Solitary pulmonary nodule: Secondary | ICD-10-CM | POA: Diagnosis not present

## 2023-07-29 DIAGNOSIS — M549 Dorsalgia, unspecified: Secondary | ICD-10-CM | POA: Diagnosis present

## 2023-07-29 DIAGNOSIS — F419 Anxiety disorder, unspecified: Secondary | ICD-10-CM | POA: Diagnosis present

## 2023-07-29 LAB — CK: Total CK: 88 U/L (ref 49–397)

## 2023-07-29 LAB — BLOOD GAS, VENOUS
Acid-Base Excess: 3.1 mmol/L — ABNORMAL HIGH (ref 0.0–2.0)
Bicarbonate: 30.2 mmol/L — ABNORMAL HIGH (ref 20.0–28.0)
Drawn by: 4237
O2 Saturation: 91.2 %
Patient temperature: 36.5
pCO2, Ven: 55 mmHg (ref 44–60)
pH, Ven: 7.35 (ref 7.25–7.43)
pO2, Ven: 57 mmHg — ABNORMAL HIGH (ref 32–45)

## 2023-07-29 LAB — COMPREHENSIVE METABOLIC PANEL
ALT: 21 U/L (ref 0–44)
AST: 23 U/L (ref 15–41)
Albumin: 4.1 g/dL (ref 3.5–5.0)
Alkaline Phosphatase: 61 U/L (ref 38–126)
Anion gap: 10 (ref 5–15)
BUN: 14 mg/dL (ref 8–23)
CO2: 27 mmol/L (ref 22–32)
Calcium: 9.1 mg/dL (ref 8.9–10.3)
Chloride: 101 mmol/L (ref 98–111)
Creatinine, Ser: 1.18 mg/dL (ref 0.61–1.24)
GFR, Estimated: >60 mL/min (ref >60)
Glucose, Bld: 164 mg/dL — ABNORMAL HIGH (ref 70–99)
Potassium: 3.8 mmol/L (ref 3.5–5.1)
Sodium: 138 mmol/L (ref 135–145)
Total Bilirubin: 1.2 mg/dL (ref 0.3–1.2)
Total Protein: 7 g/dL (ref 6.5–8.1)

## 2023-07-29 LAB — CBC WITH DIFFERENTIAL/PLATELET
Abs Immature Granulocytes: 0.04 10*3/uL (ref 0.00–0.07)
Basophils Absolute: 0 10*3/uL (ref 0.0–0.1)
Basophils Relative: 0 %
Eosinophils Absolute: 0.1 10*3/uL (ref 0.0–0.5)
Eosinophils Relative: 1 %
HCT: 40.4 % (ref 39.0–52.0)
Hemoglobin: 13 g/dL (ref 13.0–17.0)
Immature Granulocytes: 0 %
Lymphocytes Relative: 40 %
Lymphs Abs: 4.4 10*3/uL — ABNORMAL HIGH (ref 0.7–4.0)
MCH: 32 pg (ref 26.0–34.0)
MCHC: 32.2 g/dL (ref 30.0–36.0)
MCV: 99.5 fL (ref 80.0–100.0)
Monocytes Absolute: 0.9 10*3/uL (ref 0.1–1.0)
Monocytes Relative: 8 %
Neutro Abs: 5.4 10*3/uL (ref 1.7–7.7)
Neutrophils Relative %: 51 %
Platelets: 208 10*3/uL (ref 150–400)
RBC: 4.06 MIL/uL — ABNORMAL LOW (ref 4.22–5.81)
RDW: 13.2 % (ref 11.5–15.5)
WBC: 10.8 10*3/uL — ABNORMAL HIGH (ref 4.0–10.5)
nRBC: 0 % (ref 0.0–0.2)

## 2023-07-29 LAB — TROPONIN I (HIGH SENSITIVITY)
Troponin I (High Sensitivity): 14 ng/L (ref <18)
Troponin I (High Sensitivity): 21 ng/L — ABNORMAL HIGH (ref <18)

## 2023-07-29 LAB — RESP PANEL BY RT-PCR (RSV, FLU A&B, COVID)  RVPGX2
Influenza A by PCR: NEGATIVE
Influenza B by PCR: NEGATIVE
Resp Syncytial Virus by PCR: NEGATIVE
SARS Coronavirus 2 by RT PCR: NEGATIVE

## 2023-07-29 LAB — LACTIC ACID, PLASMA
Lactic Acid, Venous: 4.1 mmol/L (ref 0.5–1.9)
Lactic Acid, Venous: 2.2 mmol/L (ref 0.5–1.9)

## 2023-07-29 LAB — BRAIN NATRIURETIC PEPTIDE: B Natriuretic Peptide: 126 pg/mL — ABNORMAL HIGH (ref 0.0–100.0)

## 2023-07-29 LAB — CBG MONITORING, ED: Glucose-Capillary: 185 mg/dL — ABNORMAL HIGH (ref 70–99)

## 2023-07-29 MED ORDER — SODIUM CHLORIDE 0.9 % IV SOLN
3.0000 g | Freq: Four times a day (QID) | INTRAVENOUS | Status: DC
  Administered 2023-07-29 – 2023-07-30 (×3): 3 g via INTRAVENOUS
  Filled 2023-07-29 (×8): qty 8

## 2023-07-29 MED ORDER — MORPHINE SULFATE (PF) 2 MG/ML IV SOLN
2.0000 mg | Freq: Once | INTRAVENOUS | Status: AC
  Administered 2023-07-29: 2 mg via INTRAVENOUS
  Filled 2023-07-29: qty 1

## 2023-07-29 MED ORDER — SODIUM CHLORIDE 0.9 % IV BOLUS
1000.0000 mL | Freq: Once | INTRAVENOUS | Status: AC
  Administered 2023-07-29: 1000 mL via INTRAVENOUS

## 2023-07-29 MED ORDER — IOHEXOL 350 MG/ML SOLN
100.0000 mL | Freq: Once | INTRAVENOUS | Status: AC | PRN
  Administered 2023-07-29: 100 mL via INTRAVENOUS

## 2023-07-29 MED ORDER — IPRATROPIUM-ALBUTEROL 0.5-2.5 (3) MG/3ML IN SOLN
RESPIRATORY_TRACT | Status: AC
  Filled 2023-07-29: qty 3

## 2023-07-29 MED ORDER — IPRATROPIUM-ALBUTEROL 0.5-2.5 (3) MG/3ML IN SOLN
3.0000 mL | Freq: Once | RESPIRATORY_TRACT | Status: AC
  Administered 2023-07-29: 3 mL via RESPIRATORY_TRACT

## 2023-07-29 MED ORDER — MAGNESIUM SULFATE 2 GM/50ML IV SOLN
2.0000 g | Freq: Once | INTRAVENOUS | Status: AC
  Administered 2023-07-29: 2 g via INTRAVENOUS
  Filled 2023-07-29: qty 50

## 2023-07-29 MED ORDER — METHYLPREDNISOLONE SODIUM SUCC 125 MG IJ SOLR
125.0000 mg | Freq: Once | INTRAMUSCULAR | Status: AC
  Administered 2023-07-29: 125 mg via INTRAVENOUS
  Filled 2023-07-29: qty 2

## 2023-07-29 MED ORDER — CHLORHEXIDINE GLUCONATE CLOTH 2 % EX PADS
6.0000 | MEDICATED_PAD | Freq: Every day | CUTANEOUS | Status: DC
  Administered 2023-07-30 (×2): 6 via TOPICAL

## 2023-07-29 MED ORDER — NITROGLYCERIN IN D5W 200-5 MCG/ML-% IV SOLN
5.0000 ug/min | INTRAVENOUS | Status: DC
  Filled 2023-07-29: qty 250

## 2023-07-29 NOTE — ED Provider Notes (Signed)
[facility] DEPARTMENT AT [facility] Provider Note  CSN: [id number] Arrival date & time: 07/29/23 1914  Chief Complaint(s) Shortness of Breath and Chest Pain  HPI Dustin [name] is a 89 y.o. male with past medical history as below, significant for alzheimer's disease, CAD, HTN, LBBB, OSA, LLL malignancy who presents to the ED with complaint of dyspnea.   Pt reports sudden onset dyspnea while eating dinner, does not believe he choked on anything. Felt like he couldn't take a deep breath, thought he was going to pass out 2/2 the shortness of breath. Worse with walking. Compliant with home meds, no recent fever or chills, no recent med changes per pt. No home o2  Dr Craige Cotta [facility] pulm > COPD Dr Roselind Messier rad onc > LLL ca   Level 5 caveat resp distress   Past Medical History Past Medical History:  Diagnosis Date   Alzheimer disease (HCC)    Anxiety    Arthritis    Atrophic gastritis    a. By EGD 02/2013.   Carotid artery disease (HCC)    a. mild-mod plaque, <50% stenosis bilat by duplex 2018.   Coronary atherosclerosis of native coronary artery    a. Multivessel s/p CABG 1996. b. DESx2 to SVG-PDA in 2012 c. DES to distal LM in 11/2019 d. cath in 02/2021 showing patent LM stent, patent LIMA-LAD, patent SVG-OM1 with chronically occluded jump limb to OM2 and chronic occlusion of SVG-D2 and SVG-PDA and no targets for intervention   DDD (degenerative disc disease)    Chronic back pain   Dementia (HCC)    Enlarged prostate    Essential hypertension    Hematuria    History of radiation therapy    Left Lung- 04/26/23-Dr. Antony Blackbird   Hypothyroidism    LBBB (left bundle branch block)    MI (myocardial infarction) (HCC)    Mixed hyperlipidemia    OA (osteoarthritis)    OSA (obstructive sleep apnea)    Pneumonia due to COVID-19 virus    February 2021   Sinus bradycardia    a. Aricept and BB discontinued due to this.   Patient Active Problem List   Diagnosis Date Noted    Primary malignant neoplasm of left lower lobe of lung (HCC) 04/05/2023   Atypical chest pain 11/23/2022   Abdominal distention 11/23/2022   Unstable angina (HCC) 08/09/2022   COVID-19 virus infection 07/31/2021   Right lower lobe pneumonia 07/31/2021   Respiratory failure, acute (HCC) 07/31/2021   Acute metabolic encephalopathy 02/10/2021   AKI (acute kidney injury) (HCC) 02/10/2021   Dysuria 10/24/2020   Pneumonia due to COVID-19 virus 02/13/2020   COPD (chronic obstructive pulmonary disease) (HCC) 12/10/2019   COPD exacerbation (HCC) 12/07/2019   Accelerating angina (HCC) 11/07/2018   Fall 11/07/2018   Gait instability 11/07/2018   Leukocytosis 11/07/2018   Depression 11/07/2018   Benign prostatic hyperplasia with urinary obstruction 11/07/2018   Chronic pain 11/07/2018   Pressure injury of skin 02/15/2017   Chronic diastolic CHF (congestive heart failure) (HCC) 12/24/2016   Sinus bradycardia 12/23/2016   Foot pain, bilateral 12/23/2016   Hypokalemia 04/05/2016   Acute respiratory failure (HCC) 04/03/2016   Acute encephalopathy 04/03/2016   Hypothyroidism 04/03/2016   Aspiration pneumonia (HCC) 04/03/2016   Dementia (HCC) 04/03/2016   Acute respiratory failure with hypoxia (HCC) 04/03/2016   Memory loss 04/20/2015   Hypoxia 01/27/2015   CAP (community acquired pneumonia) 01/27/2015   Fever 08/30/2014   Healthcare associated bacterial pneumonia 08/30/2014  Toxic Metabolic encephalopathy 08/30/2014   Sepsis (HCC) 08/30/2014   Neck pain on right side 08/29/2014   Neck pain 08/29/2014   Left-sided weakness 06/28/2014   Chest pain 06/27/2014   Tubular adenoma of colon 03/05/2013   Anorexia 03/05/2013   Loss of weight 03/05/2013   Chronic constipation 07/04/2012   Bronchitis 08/16/2011   Hyperlipidemia 10/01/2009   OSA (obstructive sleep apnea) 10/01/2009   Alzheimer disease (HCC) 10/01/2009   HYPERTENSION, BENIGN 10/01/2009   Coronary artery disease 10/01/2009    Home Medication(s) Prior to Admission medications   Medication Sig Start Date End Date Taking? Authorizing Provider  albuterol (PROVENTIL) (2.5 MG/3ML) 0.083% nebulizer solution Take 3 mLs (2.5 mg total) by nebulization every 2 (two) hours as needed for wheezing or shortness of breath. 11/24/22   Shon Hale, MD  albuterol (VENTOLIN HFA) 108 (90 Base) MCG/ACT inhaler Inhale 2 puffs into the lungs every 6 (six) hours as needed for wheezing or shortness of breath. 11/24/22   Shon Hale, MD  alfuzosin (UROXATRAL) 10 MG 24 hr tablet TAKE 1 TABLET DAILY WITH BREAKFAST. 05/10/23   McKenzie, Mardene Celeste, MD  aspirin EC 81 MG tablet Take 1 tablet (81 mg total) by mouth daily. Swallow whole. 02/14/23   Sharlene Dory, NP  Budeson-Glycopyrrol-Formoterol (BREZTRI AEROSPHERE) 160-9-4.8 MCG/ACT AERO Inhale 2 puffs into the lungs in the morning and at bedtime. 03/16/23   Coralyn Helling, MD  clopidogrel (PLAVIX) 75 MG tablet Take 1 tablet (75 mg total) by mouth daily. 11/24/22   Shon Hale, MD  docusate sodium (COLACE) 100 MG capsule Take by mouth as directed. 03/31/23   [provider]  DULoxetine (CYMBALTA) 30 MG capsule Take 30 mg by mouth daily. 05/10/23   [provider]  dutasteride (AVODART) 0.5 MG capsule TAKE ONE CAPSULE BY MOUTH ONCE DAILY IN THE AFTERNOON. 04/04/23   Malen Gauze, MD  escitalopram (LEXAPRO) 20 MG tablet Take 20 mg by mouth every morning.     [provider]  furosemide (LASIX) 20 MG tablet Take 1 tablet (20 mg total) by mouth 2 (two) times daily. For fluid 11/24/22   Shon Hale, MD  gabapentin (NEURONTIN) 100 MG capsule Take 100 mg by mouth 3 (three) times daily. 03/04/23   [provider]  guaiFENesin (MUCINEX) 600 MG 12 hr tablet Take 1 tablet (600 mg total) by mouth 2 (two) times daily. 11/24/22   Shon Hale, MD  isosorbide mononitrate (IMDUR) 120 MG 24 hr tablet Take 1 tablet (120 mg total) by mouth every morning.  11/24/22   Shon Hale, MD  levothyroxine (SYNTHROID) 75 MCG tablet Take 75 mcg by mouth daily. 02/23/22   [provider]  memantine (NAMENDA) 10 MG tablet Take 10 mg by mouth 2 (two) times daily.  03/05/16   [provider]  nitroGLYCERIN (NITROSTAT) 0.4 MG SL tablet Place 1 tablet (0.4 mg total) under the tongue every 5 (five) minutes as needed for chest pain. 11/24/22   Shon Hale, MD  oxycodone (ROXICODONE) 30 MG immediate release tablet Take 30 mg by mouth every 6 (six) hours as needed for pain. 09/02/21   [provider]  pantoprazole (PROTONIX) 40 MG tablet Take 1 tablet (40 mg total) by mouth 2 (two) times daily for 14 days, THEN 1 tablet (40 mg total) daily. 02/14/23 02/28/24  Sharlene Dory, NP  potassium chloride SA (KLOR-CON M) 20 MEQ tablet Take 1 tablet (20 mEq total) by mouth daily. 11/24/22   Shon Hale, MD  QUEtiapine (SEROQUEL)  25 MG tablet Take 25 mg by mouth at bedtime as needed (sleep). 11/19/19   [provider]  ranolazine (RANEXA) 1000 MG SR tablet Take 1 tablet (1,000 mg total) by mouth 2 (two) times daily. 11/24/22   Shon Hale, MD  risperiDONE (RISPERDAL) 0.25 MG tablet Take 0.25 mg by mouth at bedtime. 03/18/23   [provider]  rosuvastatin (CRESTOR) 40 MG tablet Take 1 tablet (40 mg total) by mouth daily at 6 PM. 11/24/22   Shon Hale, MD                                                                                                                                    Past Surgical History Past Surgical History:  Procedure Laterality Date   CATARACT EXTRACTION W/PHACO Left 03/22/2022   Procedure: CATARACT EXTRACTION PHACO AND INTRAOCULAR LENS PLACEMENT (IOC);  Surgeon: Fabio Pierce, MD;  Location: [facility];  Service: Ophthalmology;  Laterality: Left;  CDE: 25.22   COLONOSCOPY  08/03/2004   Jenkins-numerous large diverticula in the descending, transverse, descending, and sigmoid colon. Otherwise normal  exam.   COLONOSCOPY  07/12/2012   RMR: External hemorrhoidal tag; multiple rectal and colonic polyps removed and/or treated as described above. Pancolonic diverticulosis. Bx-tubular adenomas, rectal hyperplastic polyp. next colonoscopy in 06/2015.   COLONOSCOPY N/A 06/22/2016   Procedure: COLONOSCOPY;  Surgeon: Franky Macho, MD;  Location: [facility];  Service: Gastroenterology;  Laterality: N/A;  730   CORONARY ANGIOPLASTY WITH STENT PLACEMENT     CORONARY ARTERY BYPASS GRAFT  1996   LIMA to LAD, SVG to D2, SVG to PDA, SVG to OM1 and OM2   CORONARY STENT INTERVENTION N/A 12/10/2019   Procedure: CORONARY STENT INTERVENTION;  Surgeon: Kathleene Hazel, MD;  Location: [facility];  Service: Cardiovascular;  Laterality: N/A;   CORONARY ULTRASOUND/IVUS N/A 12/10/2019   Procedure: Intravascular Ultrasound/IVUS;  Surgeon: Kathleene Hazel, MD;  Location: [facility];  Service: Cardiovascular;  Laterality: N/A;   ESOPHAGOGASTRODUODENOSCOPY N/A 03/16/2013   Procedure: ESOPHAGOGASTRODUODENOSCOPY (EGD);  Surgeon: Corbin Ade, MD;  Location: [facility];  Service: Endoscopy;  Laterality: N/A;  12:00-moved to 1030 Leigh Ann notified pt   ESOPHAGOGASTRODUODENOSCOPY N/A 03/06/2013   Procedure: ESOPHAGOGASTRODUODENOSCOPY (EGD);  Surgeon: Corbin Ade, MD;  Location: [facility];  Service: Endoscopy;  Laterality: N/A;   HERNIA REPAIR     LEFT HEART CATH AND CORS/GRAFTS ANGIOGRAPHY N/A 12/10/2019   Procedure: LEFT HEART CATH AND CORS/GRAFTS ANGIOGRAPHY;  Surgeon: Kathleene Hazel, MD;  Location: [facility];  Service: Cardiovascular;  Laterality: N/A;   LEFT HEART CATH AND CORS/GRAFTS ANGIOGRAPHY N/A 03/02/2021   Procedure: LEFT HEART CATH AND CORS/GRAFTS ANGIOGRAPHY;  Surgeon: Yvonne Kendall, MD;  Location: [facility];  Service: Cardiovascular;  Laterality: N/A;   Family History Family History  Problem Relation Age of Onset   Heart disease Other  Heart attack Mother    Colon cancer Neg Hx     Social History Social History   Tobacco Use   Smoking status: Former    Current packs/day: 0.00    Average packs/day: 2.0 packs/day for 40.0 years (80.0 ttl pk-yrs)    Types: Cigarettes    Start date: 12/27/1954    Quit date: 12/27/1994    Years since quitting: 28.6   Smokeless tobacco: Never  Vaping Use   Vaping status: Never Used  Substance Use Topics   Alcohol use: No    Alcohol/week: 0.0 standard drinks of alcohol   Drug use: No   Allergies Patient has no known allergies.  Review of Systems Review of Systems  Unable to perform ROS: Acuity of condition    Physical Exam Vital Signs  I have reviewed the triage vital signs BP 105/74   Pulse 76   Temp 97.7 F (36.5 C)   Resp (!) 21   SpO2 94%  Physical Exam Vitals and nursing note reviewed. Exam conducted with a chaperone present.  Constitutional:      General: He is in acute distress.     Appearance: He is well-developed. He is ill-appearing and diaphoretic.  HENT:     Head: Normocephalic and atraumatic.     Right Ear: External ear normal.     Left Ear: External ear normal.     Nose: Nose normal.     Mouth/Throat:     Mouth: Mucous membranes are dry.  Eyes:     General:        Right eye: No discharge.        Left eye: No discharge.     Conjunctiva/sclera: Conjunctivae normal.  Cardiovascular:     Rate and Rhythm: Regular rhythm. Tachycardia present.     Pulses: Normal pulses.     Heart sounds: Normal heart sounds.  Pulmonary:     Effort: Tachypnea and accessory muscle usage present.     Breath sounds: Decreased air movement present. Wheezing present.     Comments: Coarse breath sounds b/l Diminished b/l Abdominal:     General: Abdomen is flat.     Palpations: Abdomen is soft.     Tenderness: There is no abdominal tenderness.  Musculoskeletal:     Cervical back: No rigidity.     Right lower leg: No edema.     Left lower leg: No edema.  Skin:     General: Skin is warm.     Capillary Refill: Capillary refill takes 2 to 3 seconds.     Coloration: Skin is ashen. Skin is not jaundiced.     Findings: No bruising.  Neurological:     Mental Status: He is alert and oriented to person, place, and time.     GCS: GCS eye subscore is 4. GCS verbal subscore is 5. GCS motor subscore is 6.     ED Results and Treatments Labs (all labs ordered are listed, but only abnormal results are displayed) Labs Reviewed  CBC WITH DIFFERENTIAL/PLATELET - Abnormal; Notable for the following components:      Result Value   WBC 10.8 (*)    RBC 4.06 (*)    Lymphs Abs 4.4 (*)    All other components within normal limits  COMPREHENSIVE METABOLIC PANEL - Abnormal; Notable for the following components:   Glucose, Bld 164 (*)    All other components within normal limits  LACTIC ACID, PLASMA - Abnormal; Notable for the following components:   Lactic Acid,  Venous 4.1 (*)    All other components within normal limits  LACTIC ACID, PLASMA - Abnormal; Notable for the following components:   Lactic Acid, Venous 2.2 (*)    All other components within normal limits  BRAIN NATRIURETIC PEPTIDE - Abnormal; Notable for the following components:   B Natriuretic Peptide 126.0 (*)    All other components within normal limits  BLOOD GAS, VENOUS - Abnormal; Notable for the following components:   pO2, Ven 57 (*)    Bicarbonate 30.2 (*)    Acid-Base Excess 3.1 (*)    All other components within normal limits  CBG MONITORING, ED - Abnormal; Notable for the following components:   Glucose-Capillary 185 (*)    All other components within normal limits  TROPONIN I (HIGH SENSITIVITY) - Abnormal; Notable for the following components:   Troponin I (High Sensitivity) 21 (*)    All other components within normal limits  RESP PANEL BY RT-PCR (RSV, FLU A&B, COVID)  RVPGX2  CK  TROPONIN I (HIGH SENSITIVITY)                                                                                                                           Radiology CT Angio Chest PE W and/or Wo Contrast  Addendum Date: 07/29/2023   ADDENDUM REPORT: 07/29/2023 22:11 ADDENDUM: Thickening of the mid to distal esophagus, possible esophagitis. Consider endoscopy for further evaluation follow-up. Electronically Signed   By: Thornell Sartorius M.D.   On: 07/29/2023 22:11   Result Date: 07/29/2023 CLINICAL DATA:  Pulmonary embolism suspected, high probability. Chest pain and shortness of breath. Nausea. EXAM: CT ANGIOGRAPHY CHEST WITH CONTRAST TECHNIQUE: Multidetector CT imaging of the chest was performed using the standard protocol during bolus administration of intravenous contrast. Multiplanar CT image reconstructions and MIPs were obtained to evaluate the vascular anatomy. RADIATION DOSE REDUCTION: This exam was performed according to the departmental dose-optimization program which includes automated exposure control, adjustment of the mA and/or kV according to patient size and/or use of iterative reconstruction technique. CONTRAST:  OMNIPAQUE IOHEXOL 350 MG/ML SOLN COMPARISON:  12/28/2022, 03/24/2023. FINDINGS: Cardiovascular: The heart is enlarged and there is no pericardial effusion. Multi-vessel coronary artery calcifications are noted. There is atherosclerotic calcification of the aorta without evidence of aneurysm. The pulmonary trunk is normal in caliber. No evidence of pulmonary embolism. Mediastinum/Nodes: No mediastinal or axillary lymphadenopathy. There is a prominent lymph node at the left hilum measuring 1.2 cm. The thyroid gland and trachea are within normal limits. There is diffuse thickening of the mid to distal esophagus. Lungs/Pleura: Centrilobular emphysematous changes are present in the lungs. Patchy airspace disease is noted at the lung bases bilaterally. There is a nodule in the superior segment of the left lower lobe measuring 1.4 cm. No effusion or pneumothorax. Upper Abdomen: No acute  abnormality. Musculoskeletal: Sternotomy wires are noted. Degenerative changes are present in the thoracic spine. No acute osseous abnormality. Review of the MIP images confirms the  above findings. IMPRESSION: 1. No evidence of pulmonary embolism. 2. Scattered atelectasis or infiltrate at the lung bases. 3. Left lower lobe pulmonary nodule measuring 1.4 cm, slightly increased from the prior exam, with findings suspicious for bronchogenic carcinoma on prior PET-CT. 4. Centrilobular emphysema. 5. Aortic atherosclerosis and coronary artery calcifications. Electronically Signed: By: Thornell Sartorius M.D. On: 07/29/2023 21:49   DG Chest Port 1 View  Result Date: 07/29/2023 CLINICAL DATA:  Chest pain shortness of breath EXAM: PORTABLE CHEST 1 VIEW COMPARISON:  05/30/2023 FINDINGS: Two frontal views of the chest demonstrates stable postsurgical changes from CABG. The cardiac silhouette is unremarkable. No acute airspace disease, effusion, or pneumothorax. Stable bibasilar scarring. No acute bony abnormality. IMPRESSION: 1. Stable chest, no acute process. Electronically Signed   By: Sharlet Salina M.D.   On: 07/29/2023 19:59    Pertinent labs & imaging results that were available during my care of the patient were reviewed by me and considered in my medical decision making (see MDM for details).  Medications Ordered in ED Medications  ipratropium-albuterol (DUONEB) 0.5-2.5 (3) MG/3ML nebulizer solution (  Not Given 07/29/23 1942)  Ampicillin-Sulbactam (UNASYN) 3 g in sodium chloride 0.9 % 100 mL IVPB (3 g Intravenous New Bag/Given 07/29/23 2225)  ipratropium-albuterol (DUONEB) 0.5-2.5 (3) MG/3ML nebulizer solution 3 mL (3 mLs Nebulization Given 07/29/23 1932)  methylPREDNISolone sodium succinate (SOLU-MEDROL) 125 mg/2 mL injection 125 mg (125 mg Intravenous Given 07/29/23 1936)  magnesium sulfate IVPB 2 g 50 mL (0 g Intravenous Stopped 07/29/23 2040)  sodium chloride 0.9 % bolus 1,000 mL (0 mLs Intravenous Stopped 07/29/23  2208)  iohexol (OMNIPAQUE) 350 MG/ML injection 100 mL (100 mLs Intravenous Contrast Given 07/29/23 2117)  morphine (PF) 2 MG/ML injection 2 mg (2 mg Intravenous Given 07/29/23 2221)                                                                                                                                     Procedures .Critical Care  Performed by: Sloan Leiter, DO Authorized by: Sloan Leiter, DO   Critical care provider statement:    Critical care time (minutes):  79   Critical care time was exclusive of:  Separately billable procedures and treating other patients   Critical care was necessary to treat or prevent imminent or life-threatening deterioration of the following conditions:  Respiratory failure   Critical care was time spent personally by me on the following activities:  Development of treatment plan with patient or surrogate, discussions with consultants, evaluation of patient's response to treatment, examination of patient, ordering and review of laboratory studies, ordering and review of radiographic studies, ordering and performing treatments and interventions, pulse oximetry, re-evaluation of patient's condition, review of old charts and obtaining history from patient or surrogate   Care discussed with: admitting provider     (including critical care time)  Medical Decision Making / ED Course  Medical Decision Making:    Dustin [name] is a 89 y.o. male  with past medical history as below, significant for alzheimer's disease, CAD, HTN, LBBB, OSA, LLL malignancy who presents to the ED with complaint of dyspnea. . The complaint involves an extensive differential diagnosis and also carries with it a high risk of complications and morbidity.  Serious etiology was considered. Ddx includes but is not limited to: In my evaluation of this patient's dyspnea my DDx includes, but is not limited to, pneumonia, pulmonary embolism, pneumothorax, pulmonary edema, metabolic acidosis,  asthma, COPD, cardiac cause, anemia, anxiety, etc.    Complete initial physical exam performed, notably the patient  was acute resp distress, diaphoretic, hypoxia to 50's on RA.    Reviewed and confirmed nursing documentation for past medical history, family history, social history.  Vital signs reviewed.    Clinical Course as of 07/29/23 2259  Caleen Essex Jul 29, 2023  2032 Spoke with daughter at bedside, reports she is concerned the pt actually may have choked on something prior to arrival at the onset of his dyspnea. Reports he has issues with swallowing at baseline. No home O2 use [SG]  2102 Respiratory status seems to be stabilizing, he is feeling much better. HDS. Will get CTPE [SG]    Clinical Course User Index [SG] Sloan Leiter, DO    Narrative: 89 yo male hx lung cancer here with resp distress Diaphoretic, hypoxic to 50-60's on arrival on RA, conversational dyspnea, in extremis Mild improvement with NRB, transitioned to BIPAP with continued improvement to resp status Vital signs improved, was initially hypertensive 180-190's sbp but now improved to 130's; HR also improved Given solumedrol and NBT Initial order for NTG given elevated by and wet lung sounds, this will be held given improvement to his BP Did improve with BIPAP, no home O2 Spoke with family as noted above, concern for possible aspiration LA is elevated, he does not appear to meet for sepsis CTPE w/o PE, does show possible pna, also pos esophagitis Start unasyn for possible aspiration in setting of hypoxia/respiratory distress Labs o/w stable Recommend admission for resp failure, pos aspiration vs COPD exacerbation vs pna Pt/family agreeable Admitted Dr Carren Rang          Dr Craige Cotta [facility] pulm > COPD Dr Roselind Messier rad onc > LLL ca   Additional history obtained: -Additional history obtained from family -External records from outside source obtained and reviewed including: Chart review including previous notes,  labs, imaging, consultation notes including prior admission, prior labs/imaging/primary care documentation   Lab Tests: -I ordered, reviewed, and interpreted labs.   The pertinent results include:   Labs Reviewed  CBC WITH DIFFERENTIAL/PLATELET - Abnormal; Notable for the following components:      Result Value   WBC 10.8 (*)    RBC 4.06 (*)    Lymphs Abs 4.4 (*)    All other components within normal limits  COMPREHENSIVE METABOLIC PANEL - Abnormal; Notable for the following components:   Glucose, Bld 164 (*)    All other components within normal limits  LACTIC ACID, PLASMA - Abnormal; Notable for the following components:   Lactic Acid, Venous 4.1 (*)    All other components within normal limits  LACTIC ACID, PLASMA - Abnormal; Notable for the following components:   Lactic Acid, Venous 2.2 (*)    All other components within normal limits  BRAIN NATRIURETIC PEPTIDE - Abnormal; Notable for the following components:   B Natriuretic Peptide 126.0 (*)  All other components within normal limits  BLOOD GAS, VENOUS - Abnormal; Notable for the following components:   pO2, Ven 57 (*)    Bicarbonate 30.2 (*)    Acid-Base Excess 3.1 (*)    All other components within normal limits  CBG MONITORING, ED - Abnormal; Notable for the following components:   Glucose-Capillary 185 (*)    All other components within normal limits  TROPONIN I (HIGH SENSITIVITY) - Abnormal; Notable for the following components:   Troponin I (High Sensitivity) 21 (*)    All other components within normal limits  RESP PANEL BY RT-PCR (RSV, FLU A&B, COVID)  RVPGX2  CK  TROPONIN I (HIGH SENSITIVITY)    Notable for trop + LA +  EKG   EKG Interpretation Date/Time:  Friday July 29 2023 19:54:22 EDT Ventricular Rate:  75 PR Interval:  175 QRS Duration:  145 QT Interval:  438 QTC Calculation: 490 R Axis:   52  Text Interpretation: Sinus rhythm Atrial premature complex IVCD, consider atypical LBBB no stemi  Confirmed by Tanda Rockers ([id number]) on 07/29/2023 10:41:39 PM         Imaging Studies ordered: I ordered imaging studies including CXR CTPE I independently visualized the following imaging with scope of interpretation limited to determining acute life threatening conditions related to emergency care; findings noted above, significant for pna, no pe or ptx I independently visualized and interpreted imaging. I agree with the radiologist interpretation   Medicines ordered and prescription drug management: Meds ordered this encounter  Medications   ipratropium-albuterol (DUONEB) 0.5-2.5 (3) MG/3ML nebulizer solution 3 mL   methylPREDNISolone sodium succinate (SOLU-MEDROL) 125 mg/2 mL injection 125 mg   magnesium sulfate IVPB 2 g 50 mL   DISCONTD: nitroGLYCERIN 50 mg in dextrose 5 % 250 mL (0.2 mg/mL) infusion   ipratropium-albuterol (DUONEB) 0.5-2.5 (3) MG/3ML nebulizer solution    Clinton, Angela: cabinet override   sodium chloride 0.9 % bolus 1,000 mL   iohexol (OMNIPAQUE) 350 MG/ML injection 100 mL   Ampicillin-Sulbactam (UNASYN) 3 g in sodium chloride 0.9 % 100 mL IVPB    Order Specific Question:   Antibiotic Indication:    Answer:   Aspiration Pneumonia   morphine (PF) 2 MG/ML injection 2 mg    -I have reviewed the patients home medicines and have made adjustments as needed   Consultations Obtained: na   Cardiac Monitoring: The patient was maintained on a cardiac monitor.  I personally viewed and interpreted the cardiac monitored which showed an underlying rhythm of: sinus tachy > NSR  Social Determinants of Health:  Diagnosis or treatment significantly limited by social determinants of health: former smoker   Reevaluation: After the interventions noted above, I reevaluated the patient and found that they have improved  Co morbidities that complicate the patient evaluation  Past Medical History:  Diagnosis Date   Alzheimer disease (HCC)    Anxiety    Arthritis     Atrophic gastritis    a. By EGD 02/2013.   Carotid artery disease (HCC)    a. mild-mod plaque, <50% stenosis bilat by duplex 2018.   Coronary atherosclerosis of native coronary artery    a. Multivessel s/p CABG 1996. b. DESx2 to SVG-PDA in 2012 c. DES to distal LM in 11/2019 d. cath in 02/2021 showing patent LM stent, patent LIMA-LAD, patent SVG-OM1 with chronically occluded jump limb to OM2 and chronic occlusion of SVG-D2 and SVG-PDA and no targets for intervention   DDD (degenerative disc disease)  Chronic back pain   Dementia (HCC)    Enlarged prostate    Essential hypertension    Hematuria    History of radiation therapy    Left Lung- 04/26/23-Dr. Antony Blackbird   Hypothyroidism    LBBB (left bundle branch block)    MI (myocardial infarction) (HCC)    Mixed hyperlipidemia    OA (osteoarthritis)    OSA (obstructive sleep apnea)    Pneumonia due to COVID-19 virus    February 2021   Sinus bradycardia    a. Aricept and BB discontinued due to this.      Dispostion: Disposition decision including need for hospitalization was considered, and patient admitted to the hospital.    Final Clinical Impression(s) / ED Diagnoses Final diagnoses:  Acute respiratory failure with hypoxia (HCC)  Pneumonia due to infectious organism, unspecified laterality, unspecified part of lung        Sloan Leiter, DO 07/29/23 2259

## 2023-07-29 NOTE — Progress Notes (Signed)
Patient had been complaining about being on Bipap.  Was given some Morphin, but had patient down to 50% FIO2 on Bipap so tried him off.  BS better.  Patient currently on 10L [place] HFNC with sat of 95% at this time.  RN made aware.

## 2023-07-29 NOTE — ED Notes (Signed)
Patient transported to CT 

## 2023-07-29 NOTE — Progress Notes (Signed)
Transported patient to and from CT without incident.  Was able to turn down FIO2 to 50% on Bipap.  Sat at 98 to 99%; will continue to wean and monitor.  Patient complaining of wanting something to drink; explained to patient that we would have to get his FIO2 demand down before taking him off Bipap, then he could have something to drink 30 minutes after removal.

## 2023-07-29 NOTE — Progress Notes (Signed)
Patient came in with SOB.  Upon auscultation, BS were crackles and diminished.  Duoneb was given per MD order and patient was placed on Bipap.  Patient has CHF history as well as COPD.  Sats are running from 91 to 93% at this time.  CXR just performed.  Settings 12/6, R16, 90%, will continue to monitor.

## 2023-07-29 NOTE — ED Triage Notes (Signed)
CP and SOB  Pt stated he was eating then CP and SOB started  Pt extremely anxious. Complaining of nausea, SOB and feeling like he is dying

## 2023-07-29 NOTE — ED Notes (Signed)
Patient given urinal at this time. Offered patient assistance, patient refused

## 2023-07-29 NOTE — ED Notes (Signed)
Date and time results received: 07/29/23 2202 (use smartphrase ".now" to insert current time)  Test: lactic acid  Critical Value: 2.2  Name of Provider Notified: Wallace Cullens  Orders Received? Or Actions Taken?: pt finishing fluid bolus

## 2023-07-29 NOTE — ED Notes (Signed)
Pt placed on emergent bipap. Respiratory and EDP at bedside. Sats improved from initial 54% on room air, 65% on NRB to 93% currently on bipap at 100% FiO2.

## 2023-07-29 NOTE — Progress Notes (Signed)
Pharmacy Antibiotic Note  Dustin [name] is a 89 y.o. male admitted on 07/29/2023 with aspiration pneumonia.  Pharmacy has been consulted for ampicillin/sulbactam  dosing.  ClCr > 60 ml/min.   Plan: ampicillin/sulbactam 3g q6hr  Monitor cultures, clinical status, renal function Narrow abx as able and f/u duration      Temp (24hrs), Avg:97.7 F (36.5 C), Min:97.7 F (36.5 C), Max:97.7 F (36.5 C)  Recent Labs  Lab 07/29/23 1929 07/29/23 2131  WBC 10.8*  --   CREATININE 1.18  --   LATICACIDVEN 4.1* 2.2*    CrCl cannot be calculated (Unknown ideal weight.).    No Known Allergies  Antimicrobials this admission: ampsulb 8/2 >>     Microbiology results: 8/2 Covid/Flu neg  Thank you for allowing pharmacy to be a part of this patient's care.  Alphia Moh, PharmD, BCPS, BCCP Clinical Pharmacist  Please check AMION for all [facility] phone numbers After 10:00 PM, call Main Pharmacy [phone]

## 2023-07-30 ENCOUNTER — Other Ambulatory Visit: Payer: Self-pay

## 2023-07-30 DIAGNOSIS — R7989 Other specified abnormal findings of blood chemistry: Secondary | ICD-10-CM | POA: Insufficient documentation

## 2023-07-30 DIAGNOSIS — I25110 Atherosclerotic heart disease of native coronary artery with unstable angina pectoris: Secondary | ICD-10-CM

## 2023-07-30 DIAGNOSIS — T17908A Unspecified foreign body in respiratory tract, part unspecified causing other injury, initial encounter: Secondary | ICD-10-CM

## 2023-07-30 DIAGNOSIS — J9601 Acute respiratory failure with hypoxia: Secondary | ICD-10-CM | POA: Diagnosis not present

## 2023-07-30 DIAGNOSIS — F32A Depression, unspecified: Secondary | ICD-10-CM | POA: Diagnosis not present

## 2023-07-30 DIAGNOSIS — E039 Hypothyroidism, unspecified: Secondary | ICD-10-CM

## 2023-07-30 LAB — COMPREHENSIVE METABOLIC PANEL
ALT: 18 U/L (ref 0–44)
AST: 19 U/L (ref 15–41)
Albumin: 3.6 g/dL (ref 3.5–5.0)
Alkaline Phosphatase: 47 U/L (ref 38–126)
Anion gap: 8 (ref 5–15)
BUN: 15 mg/dL (ref 8–23)
CO2: 24 mmol/L (ref 22–32)
Calcium: 8.3 mg/dL — ABNORMAL LOW (ref 8.9–10.3)
Chloride: 102 mmol/L (ref 98–111)
Creatinine, Ser: 1.08 mg/dL (ref 0.61–1.24)
GFR, Estimated: >60 mL/min (ref >60)
Glucose, Bld: 241 mg/dL — ABNORMAL HIGH (ref 70–99)
Potassium: 3.9 mmol/L (ref 3.5–5.1)
Sodium: 134 mmol/L — ABNORMAL LOW (ref 135–145)
Total Bilirubin: 1.1 mg/dL (ref 0.3–1.2)
Total Protein: 6.2 g/dL — ABNORMAL LOW (ref 6.5–8.1)

## 2023-07-30 LAB — CBC WITH DIFFERENTIAL/PLATELET
Abs Immature Granulocytes: 0.03 10*3/uL (ref 0.00–0.07)
Basophils Absolute: 0 10*3/uL (ref 0.0–0.1)
Basophils Relative: 0 %
Eosinophils Absolute: 0 10*3/uL (ref 0.0–0.5)
Eosinophils Relative: 0 %
HCT: 37.3 % — ABNORMAL LOW (ref 39.0–52.0)
Hemoglobin: 12.4 g/dL — ABNORMAL LOW (ref 13.0–17.0)
Immature Granulocytes: 0 %
Lymphocytes Relative: 3 %
Lymphs Abs: 0.5 10*3/uL — ABNORMAL LOW (ref 0.7–4.0)
MCH: 32.3 pg (ref 26.0–34.0)
MCHC: 33.2 g/dL (ref 30.0–36.0)
MCV: 97.1 fL (ref 80.0–100.0)
Monocytes Absolute: 0.4 10*3/uL (ref 0.1–1.0)
Monocytes Relative: 3 %
Neutro Abs: 12.5 10*3/uL — ABNORMAL HIGH (ref 1.7–7.7)
Neutrophils Relative %: 94 %
Platelets: 162 10*3/uL (ref 150–400)
RBC: 3.84 MIL/uL — ABNORMAL LOW (ref 4.22–5.81)
RDW: 13.2 % (ref 11.5–15.5)
WBC: 13.4 10*3/uL — ABNORMAL HIGH (ref 4.0–10.5)
nRBC: 0 % (ref 0.0–0.2)

## 2023-07-30 LAB — TROPONIN I (HIGH SENSITIVITY)
Troponin I (High Sensitivity): 40 ng/L — ABNORMAL HIGH (ref <18)
Troponin I (High Sensitivity): 38 ng/L — ABNORMAL HIGH (ref <18)

## 2023-07-30 LAB — MAGNESIUM: Magnesium: 2 mg/dL (ref 1.7–2.4)

## 2023-07-30 LAB — LACTIC ACID, PLASMA: Lactic Acid, Venous: 2.1 mmol/L (ref 0.5–1.9)

## 2023-07-30 MED ORDER — GABAPENTIN 100 MG PO CAPS
100.0000 mg | ORAL_CAPSULE | Freq: Three times a day (TID) | ORAL | Status: DC
  Administered 2023-07-30: 100 mg via ORAL
  Filled 2023-07-30: qty 1

## 2023-07-30 MED ORDER — METHYLPREDNISOLONE SODIUM SUCC 125 MG IJ SOLR
125.0000 mg | Freq: Two times a day (BID) | INTRAMUSCULAR | Status: DC
  Administered 2023-07-30: 125 mg via INTRAVENOUS
  Filled 2023-07-30: qty 2

## 2023-07-30 MED ORDER — RANOLAZINE ER 500 MG PO TB12
1000.0000 mg | ORAL_TABLET | Freq: Two times a day (BID) | ORAL | Status: DC
  Administered 2023-07-30 (×2): 1000 mg via ORAL
  Filled 2023-07-30 (×2): qty 2

## 2023-07-30 MED ORDER — ACETAMINOPHEN 325 MG PO TABS: 650.0000 mg | ORAL_TABLET | Freq: Four times a day (QID) | ORAL | Status: DC | PRN

## 2023-07-30 MED ORDER — ISOSORBIDE MONONITRATE ER 60 MG PO TB24
120.0000 mg | ORAL_TABLET | Freq: Every morning | ORAL | Status: DC
  Administered 2023-07-30: 120 mg via ORAL
  Filled 2023-07-30: qty 2

## 2023-07-30 MED ORDER — ROSUVASTATIN CALCIUM 20 MG PO TABS: 40.0000 mg | ORAL_TABLET | Freq: Every day | ORAL | Status: DC

## 2023-07-30 MED ORDER — LEVOTHYROXINE SODIUM 75 MCG PO TABS
75.0000 ug | ORAL_TABLET | Freq: Every day | ORAL | Status: DC
  Administered 2023-07-30: 75 ug via ORAL
  Filled 2023-07-30: qty 1

## 2023-07-30 MED ORDER — MOMETASONE FURO-FORMOTEROL FUM 100-5 MCG/ACT IN AERO
2.0000 | INHALATION_SPRAY | Freq: Two times a day (BID) | RESPIRATORY_TRACT | Status: DC
  Administered 2023-07-30: 2 via RESPIRATORY_TRACT
  Filled 2023-07-30: qty 8.8

## 2023-07-30 MED ORDER — CLOPIDOGREL BISULFATE 75 MG PO TABS
75.0000 mg | ORAL_TABLET | Freq: Every day | ORAL | Status: DC
  Administered 2023-07-30: 75 mg via ORAL
  Filled 2023-07-30: qty 1

## 2023-07-30 MED ORDER — ONDANSETRON HCL 4 MG PO TABS: 4.0000 mg | ORAL_TABLET | Freq: Four times a day (QID) | ORAL | Status: DC | PRN

## 2023-07-30 MED ORDER — HEPARIN SODIUM (PORCINE) 5000 UNIT/ML IJ SOLN
5000.0000 [IU] | Freq: Three times a day (TID) | INTRAMUSCULAR | Status: DC
  Administered 2023-07-30: 5000 [IU] via SUBCUTANEOUS
  Filled 2023-07-30: qty 1

## 2023-07-30 MED ORDER — ESCITALOPRAM OXALATE 10 MG PO TABS
20.0000 mg | ORAL_TABLET | Freq: Every morning | ORAL | Status: DC
  Administered 2023-07-30: 20 mg via ORAL
  Filled 2023-07-30: qty 2

## 2023-07-30 MED ORDER — ACETAMINOPHEN 650 MG RE SUPP: 650.0000 mg | Freq: Four times a day (QID) | RECTAL | Status: DC | PRN

## 2023-07-30 MED ORDER — PREDNISONE 20 MG PO TABS: 40.0000 mg | ORAL_TABLET | Freq: Every day | ORAL | Status: DC

## 2023-07-30 MED ORDER — DULOXETINE HCL 30 MG PO CPEP
30.0000 mg | ORAL_CAPSULE | Freq: Every day | ORAL | Status: DC
  Administered 2023-07-30: 30 mg via ORAL
  Filled 2023-07-30: qty 1

## 2023-07-30 MED ORDER — IPRATROPIUM-ALBUTEROL 0.5-2.5 (3) MG/3ML IN SOLN
3.0000 mL | Freq: Four times a day (QID) | RESPIRATORY_TRACT | Status: DC
  Administered 2023-07-30: 3 mL via RESPIRATORY_TRACT
  Filled 2023-07-30: qty 3

## 2023-07-30 MED ORDER — AMOXICILLIN-POT CLAVULANATE 875-125 MG PO TABS: 1.0000 | ORAL_TABLET | Freq: Two times a day (BID) | ORAL | 0 refills | Status: AC

## 2023-07-30 MED ORDER — MEMANTINE HCL 10 MG PO TABS
10.0000 mg | ORAL_TABLET | Freq: Two times a day (BID) | ORAL | Status: DC
  Administered 2023-07-30 (×2): 10 mg via ORAL
  Filled 2023-07-30 (×2): qty 1

## 2023-07-30 MED ORDER — MORPHINE SULFATE (PF) 2 MG/ML IV SOLN
2.0000 mg | INTRAVENOUS | Status: DC | PRN
  Administered 2023-07-30: 2 mg via INTRAVENOUS
  Filled 2023-07-30: qty 1

## 2023-07-30 MED ORDER — ALBUTEROL SULFATE (2.5 MG/3ML) 0.083% IN NEBU: 2.5000 mg | INHALATION_SOLUTION | RESPIRATORY_TRACT | Status: DC | PRN

## 2023-07-30 MED ORDER — QUETIAPINE FUMARATE 25 MG PO TABS
25.0000 mg | ORAL_TABLET | Freq: Every evening | ORAL | Status: DC | PRN
  Administered 2023-07-30: 25 mg via ORAL
  Filled 2023-07-30: qty 1

## 2023-07-30 MED ORDER — PANTOPRAZOLE SODIUM 40 MG PO TBEC
40.0000 mg | DELAYED_RELEASE_TABLET | Freq: Every day | ORAL | Status: DC
  Administered 2023-07-30: 40 mg via ORAL
  Filled 2023-07-30: qty 1

## 2023-07-30 MED ORDER — ASPIRIN 81 MG PO TBEC
81.0000 mg | DELAYED_RELEASE_TABLET | Freq: Every day | ORAL | Status: DC
  Administered 2023-07-30: 81 mg via ORAL
  Filled 2023-07-30: qty 1

## 2023-07-30 MED ORDER — BUDESON-GLYCOPYRROL-FORMOTEROL 160-9-4.8 MCG/ACT IN AERO: 2.0000 | INHALATION_SPRAY | Freq: Two times a day (BID) | RESPIRATORY_TRACT | Status: DC

## 2023-07-30 MED ORDER — UMECLIDINIUM BROMIDE 62.5 MCG/ACT IN AEPB
1.0000 | INHALATION_SPRAY | Freq: Every day | RESPIRATORY_TRACT | Status: DC
  Administered 2023-07-30: 1 via RESPIRATORY_TRACT
  Filled 2023-07-30: qty 7

## 2023-07-30 MED ORDER — ONDANSETRON HCL 4 MG/2ML IJ SOLN: 4.0000 mg | Freq: Four times a day (QID) | INTRAMUSCULAR | Status: DC | PRN

## 2023-07-30 MED ORDER — RISPERIDONE 0.25 MG PO TABS
0.2500 mg | ORAL_TABLET | Freq: Every day | ORAL | Status: DC
  Administered 2023-07-30: 0.25 mg via ORAL
  Filled 2023-07-30 (×3): qty 1

## 2023-07-30 NOTE — Assessment & Plan Note (Signed)
Continue Crestor 

## 2023-07-30 NOTE — Progress Notes (Signed)
SATURATION QUALIFICATIONS: (This note is used to comply with regulatory documentation for home oxygen)  Patient Saturations on Room Air at Rest = 94% on RA  Patient Saturations on Room Air while Ambulating = 92-95% (fluctuates with pt. Talking)  Patient Saturations on 0 Liters of oxygen while Ambulating = 92-95%

## 2023-07-30 NOTE — Assessment & Plan Note (Signed)
-   Troponin uptrending 20, 40, 38 - Likely demand ischemia in the setting of aspiration pneumonia/pneumonitis - No chest pain - Monitor on telemetry

## 2023-07-30 NOTE — Assessment & Plan Note (Signed)
Continue Synthroid °

## 2023-07-30 NOTE — Progress Notes (Signed)
Pt. Discharged in stable condition via car with family. Pt. Pushed down to car in wheelchair by staff. IV removed with tip intact and minimal bleeding, pressure applied and bleeding stopped. Discharge instructions given and explained in great detail to pt. Pt. Verbalized understanding of all instructions and denies any questions or concerns.

## 2023-07-30 NOTE — TOC Initial Note (Addendum)
Transition of Care [facility]) - Initial/Assessment Note    Patient Details  Name: Dustin [name] MRN: [id number] Date of Birth: [birth year]  Transition of Care (TOC) CM/SW Contact:    Catalina Gravel, LCSW Phone Number: 07/30/2023, 11:02 AM  Clinical Narrative:                  Patient admitted from home, Acute Respiratory failure with Hypoxia. Patient has high risk for readmission.  CSW completed assessment with pts spouse. Pt sleeps most of day, spouse and adult children assist with  ADLs. Family reside in single family home, he does not use stairs. Pts spouse is able to provide transportation to appointments when needed. Pt has a cane, walker, shower chair. Spouse states no needs, but care is difficult, he is slower.  CSW shared if they needed any DME to follow up with PCP once DC. TOC to follow.    Expected Discharge Plan: Home/Self Care Barriers to Discharge: No Barriers Identified   Patient Goals and CMS Choice Patient states their goals for this hospitalization and ongoing recovery are:: Return home with spouse          Expected Discharge Plan and Services In-house Referral: Clinical Social Work     Living arrangements for the past 2 months: Single Family Home Expected Discharge Date: 07/30/23                                    Prior Living Arrangements/Services Living arrangements for the past 2 months: Single Family Home Lives with:: Spouse (Adult son stays at times to assist.) Patient language and need for interpreter reviewed:: Yes Do you feel safe going back to the place where you live?: Yes      Need for Family Participation in Patient Care: Yes (Comment) Care giver support system in place?: Yes (comment) (Spouse, adult children.) Current home services: DME Criminal Activity/Legal Involvement Pertinent to Current Situation/Hospitalization: No - Comment as needed  Activities of Daily Living Home Assistive Devices/Equipment: Wheelchair, Electric  scooter ADL Screening (condition at time of admission) Patient's cognitive ability adequate to safely complete daily activities?: Yes Is the patient deaf or have difficulty hearing?: No Does the patient have difficulty seeing, even when wearing glasses/contacts?: No Does the patient have difficulty concentrating, remembering, or making decisions?: No Patient able to express need for assistance with ADLs?: Yes Does the patient have difficulty dressing or bathing?: No Independently performs ADLs?: Yes (appropriate for developmental age) Communication: Independent Dressing (OT): Independent Grooming: Independent Feeding: Independent Bathing: Independent Toileting: Independent In/Out Bed: Independent Walks in Home: Independent with device (comment) Does the patient have difficulty walking or climbing stairs?: Yes Weakness of Legs: None Weakness of Arms/Hands: None  Permission Sought/Granted                  Emotional Assessment Appearance:: Appears stated age Attitude/Demeanor/Rapport: Unable to Assess Affect (typically observed): Unable to Assess Orientation: : Oriented to Self      Admission diagnosis:  SOB (shortness of breath) [R06.02] Acute respiratory failure with hypoxia (HCC) [J96.01] Pneumonia due to infectious organism, unspecified laterality, unspecified part of lung [J18.9] Patient Active Problem List   Diagnosis Date Noted   Elevated troponin 07/30/2023   Aspiration into airway 07/30/2023   Primary malignant neoplasm of left lower lobe of lung (HCC) 04/05/2023   Atypical chest pain 11/23/2022   Abdominal distention 11/23/2022   Unstable angina (HCC) 08/09/2022  COVID-19 virus infection 07/31/2021   Right lower lobe pneumonia 07/31/2021   Respiratory failure, acute (HCC) 07/31/2021   Acute metabolic encephalopathy 02/10/2021   AKI (acute kidney injury) (HCC) 02/10/2021   Dysuria 10/24/2020   Pneumonia due to COVID-19 virus 02/13/2020   COPD (chronic  obstructive pulmonary disease) (HCC) 12/10/2019   COPD exacerbation (HCC) 12/07/2019   Accelerating angina (HCC) 11/07/2018   Fall 11/07/2018   Gait instability 11/07/2018   Leukocytosis 11/07/2018   Depression 11/07/2018   Benign prostatic hyperplasia with urinary obstruction 11/07/2018   Chronic pain 11/07/2018   Pressure injury of skin 02/15/2017   Chronic diastolic CHF (congestive heart failure) (HCC) 12/24/2016   Sinus bradycardia 12/23/2016   Foot pain, bilateral 12/23/2016   Hypokalemia 04/05/2016   Acute respiratory failure (HCC) 04/03/2016   Acute encephalopathy 04/03/2016   Hypothyroidism 04/03/2016   Aspiration pneumonia (HCC) 04/03/2016   Dementia (HCC) 04/03/2016   Acute respiratory failure with hypoxia (HCC) 04/03/2016   Memory loss 04/20/2015   Hypoxia 01/27/2015   CAP (community acquired pneumonia) 01/27/2015   Fever 08/30/2014   Healthcare associated bacterial pneumonia 08/30/2014   Toxic Metabolic encephalopathy 08/30/2014   Sepsis (HCC) 08/30/2014   Neck pain on right side 08/29/2014   Neck pain 08/29/2014   Left-sided weakness 06/28/2014   Chest pain 06/27/2014   Tubular adenoma of colon 03/05/2013   Anorexia 03/05/2013   Loss of weight 03/05/2013   Chronic constipation 07/04/2012   Bronchitis 08/16/2011   Hyperlipidemia 10/01/2009   OSA (obstructive sleep apnea) 10/01/2009   Alzheimer disease (HCC) 10/01/2009   HYPERTENSION, BENIGN 10/01/2009   Coronary artery disease 10/01/2009   PCP:  [name], MD Pharmacy:   [facility] [place] [place] - [address] [place] [address] Phone: [phone] Fax: [phone]     Social Determinants of Health (SDOH) Social History: SDOH Screenings   Food Insecurity: No Food Insecurity (07/29/2023)  Housing: Patient Declined (07/29/2023)  Transportation Needs: No Transportation Needs (07/29/2023)  Utilities: Not At Risk (07/29/2023)  Depression (PHQ2-9): Low Risk  (04/05/2023)   Tobacco Use: Medium Risk (07/29/2023)   SDOH Interventions:     Readmission Risk Interventions    07/30/2023   11:01 AM 02/12/2021   11:33 AM  Readmission Risk Prevention Plan  Transportation Screening Complete   Medication Review (RN Care Manager) Complete   PCP or Specialist appointment within 3-5 days of discharge Complete Not Complete  PCP/Specialist Appt Not Complete comments  first available was 02/23/21  HRI or Home Care Consult Complete Complete  SW Recovery Care/Counseling Consult Complete Complete  Palliative Care Screening Not Applicable Not Applicable  Skilled Nursing Facility Not Applicable Not Applicable

## 2023-07-30 NOTE — Assessment & Plan Note (Signed)
-   Continue aspirin, Imdur, Crestor, Ranexa, Plavix

## 2023-07-30 NOTE — Progress Notes (Signed)
Notified Dr. Lactic acid critical of 2.1

## 2023-07-30 NOTE — Plan of Care (Signed)
Problem: Education: Goal: Knowledge of General Education information will improve Description: Including pain rating scale, medication(s)/side effects and non-pharmacologic comfort measures 07/30/2023 1108 by Luciana Axe, Lorella Nimrod, RN Outcome: Completed/Met 07/30/2023 1034 by Luciana Axe, Lorella Nimrod, RN Outcome: Progressing   Problem: Health Behavior/Discharge Planning: Goal: Ability to manage health-related needs will improve 07/30/2023 1108 by Luciana Axe, Lorella Nimrod, RN Outcome: Completed/Met 07/30/2023 1034 by Luciana Axe, Lorella Nimrod, RN Outcome: Progressing   Problem: Clinical Measurements: Goal: Ability to maintain clinical measurements within normal limits will improve 07/30/2023 1108 by Luciana Axe, Lorella Nimrod, RN Outcome: Completed/Met 07/30/2023 1034 by Luciana Axe, Lorella Nimrod, RN Outcome: Progressing Goal: Will remain free from infection 07/30/2023 1108 by Luciana Axe, Lorella Nimrod, RN Outcome: Completed/Met 07/30/2023 1034 by Luciana Axe, Lorella Nimrod, RN Outcome: Progressing Goal: Diagnostic test results will improve 07/30/2023 1108 by Luciana Axe, Lorella Nimrod, RN Outcome: Completed/Met 07/30/2023 1034 by Luciana Axe, Lorella Nimrod, RN Outcome: Progressing Goal: Respiratory complications will improve 07/30/2023 1108 by Luciana Axe, Lorella Nimrod, RN Outcome: Completed/Met 07/30/2023 1034 by Luciana Axe, Lorella Nimrod, RN Outcome: Progressing Goal: Cardiovascular complication will be avoided 07/30/2023 1108 by Luciana Axe, Lorella Nimrod, RN Outcome: Completed/Met 07/30/2023 1034 by Luciana Axe, Lorella Nimrod, RN Outcome: Progressing   Problem: Activity: Goal: Risk for activity intolerance will decrease 07/30/2023 1108 by Luciana Axe, Lorella Nimrod, RN Outcome: Completed/Met 07/30/2023 1034 by Luciana Axe, Lorella Nimrod, RN Outcome: Progressing   Problem: Nutrition: Goal: Adequate nutrition will be maintained 07/30/2023 1108 by Luciana Axe, Lorella Nimrod, RN Outcome: Completed/Met 07/30/2023 1034 by Luciana Axe, Lorella Nimrod, RN Outcome: Progressing   Problem: Coping: Goal: Level of anxiety will decrease 07/30/2023 1108 by Luciana Axe, Lorella Nimrod, RN Outcome: Completed/Met 07/30/2023 1034 by Luciana Axe, Lorella Nimrod, RN Outcome: Progressing   Problem: Elimination: Goal: Will not experience complications related to bowel motility 07/30/2023 1108 by Luciana Axe, Lorella Nimrod, RN Outcome: Completed/Met 07/30/2023 1034 by Luciana Axe, Lorella Nimrod, RN Outcome: Progressing Goal: Will not experience complications related to urinary retention 07/30/2023 1108 by Luciana Axe, Lorella Nimrod, RN Outcome: Completed/Met 07/30/2023 1034 by Luciana Axe, Lorella Nimrod, RN Outcome: Progressing   Problem: Pain Managment: Goal: General experience of comfort will improve 07/30/2023 1108 by Luciana Axe, Lorella Nimrod, RN Outcome: Completed/Met 07/30/2023 1034 by Luciana Axe, Lorella Nimrod, RN Outcome: Progressing   Problem: Safety: Goal: Ability to remain free from injury will improve 07/30/2023 1108 by Luciana Axe, Lorella Nimrod, RN Outcome: Completed/Met 07/30/2023 1034 by Luciana Axe, Lorella Nimrod, RN Outcome: Progressing   Problem: Skin Integrity: Goal: Risk for impaired skin integrity will decrease 07/30/2023 1108 by Luciana Axe, Lorella Nimrod, RN Outcome: Completed/Met 07/30/2023 1034 by Luciana Axe, Lorella Nimrod, RN Outcome: Progressing   Problem: Education: Goal: Knowledge of disease or condition will improve 07/30/2023 1108 by Luciana Axe, Lorella Nimrod, RN Outcome: Completed/Met 07/30/2023 1034 by Luciana Axe, Lorella Nimrod, RN Outcome: Progressing Goal: Knowledge of the prescribed therapeutic regimen will improve 07/30/2023 1108 by Luciana Axe, Lorella Nimrod, RN Outcome: Completed/Met 07/30/2023 1034 by Luciana Axe, Lorella Nimrod, RN Outcome: Progressing Goal: Individualized Educational Video(s) 07/30/2023 1108 by Luciana Axe, Lorella Nimrod, RN Outcome: Completed/Met 07/30/2023 1034 by Luciana Axe, Lorella Nimrod, RN Outcome: Progressing   Problem:  Activity: Goal: Ability to tolerate increased activity will improve 07/30/2023 1108 by Luciana Axe, Lorella Nimrod, RN Outcome: Completed/Met 07/30/2023 1034 by Luciana Axe, Lorella Nimrod, RN Outcome: Progressing Goal: Will verbalize the importance of balancing activity with adequate rest periods 07/30/2023 1108 by Toni Arthurs  Ross Ludwig, RN Outcome: Completed/Met 07/30/2023 1034 by Luciana Axe, Lorella Nimrod, RN Outcome: Progressing   Problem: Respiratory: Goal: Ability to maintain a clear airway will improve 07/30/2023 1108 by Luciana Axe, Lorella Nimrod, RN Outcome: Completed/Met 07/30/2023 1034 by Luciana Axe, Lorella Nimrod, RN Outcome: Progressing Goal: Levels of oxygenation will improve 07/30/2023 1108 by Luciana Axe, Lorella Nimrod, RN Outcome: Completed/Met 07/30/2023 1034 by Luciana Axe, Lorella Nimrod, RN Outcome: Progressing Goal: Ability to maintain adequate ventilation will improve 07/30/2023 1108 by Luciana Axe, Lorella Nimrod, RN Outcome: Completed/Met 07/30/2023 1034 by Luciana Axe, Lorella Nimrod, RN Outcome: Progressing

## 2023-07-30 NOTE — Assessment & Plan Note (Signed)
-   Patient was eating when he choked, cough, then seemed to have a regurgitation event - Chest x-ray is stable - Patient started on Unasyn, continue Unasyn - Could negative COVID and flu - CTA chest shows no PE but does show emphysema and a known nodule - Speech eval and treat - Continue to monitor

## 2023-07-30 NOTE — Discharge Summary (Signed)
Physician Discharge Summary   Patient: Dustin [name] MRN: [id number] DOB: 11-24-1934  Admit date:     07/29/2023  Discharge date: 07/30/23  Discharge Physician: Tyrone Nine   PCP: [name], MD   Recommendations at discharge:  Follow up with PCP in 1-2 weeks. Follow a soft diet.  Discharge Diagnoses: Principal Problem:   Acute respiratory failure with hypoxia (HCC) Active Problems:   Hyperlipidemia   Coronary artery disease   Hypothyroidism   Depression   Elevated troponin   Aspiration into airway  Hospital Course: HPI: Dustin [name] is a 89 y.o. male with medical history significant of Alzheimer's disease, carotid artery disease, coronary artery disease, hypothyroidism, hypertension, OSA, and more presents the ED with a chief complaint of aspiration.  Patient reports that he was eating dinner when he choked.  When he was eating he had solid food not liquids when he choked.  He went into a coughing fit and felt like he could not breathe.  He did not have chest pain.  He did feel short of breath.  Since then he has not had any further shortness of breath, but he is still having a cough.  He reports a productive cough with nonbloody, light green sputum.  He reports up till this aspiration event he was feeling his normal self.  He does not wear oxygen at home.  He would like to know when he can go home, but he still requiring oxygen.  He has been advised that he will first need to be able to tolerate ambulation without oxygen is much as that he would do at home.  Patient denies feeling feverish, but he is clammy.  Patient has no other complaints at this time.   Patient does not smoke, does not drink.  He is full code.  Hospital Course: He was admitted to SDU and overnight improved, no longer requiring supplemental oxygen even with exertion. Requested discharge, felt very well, understands dietary limitations recommended.  Assessment and Plan: * Acute respiratory failure with  hypoxia (HCC) - Secondary to aspiration pneumonia/aspiration pneumonitis - Resolved quickly  Aspiration into airway, suspected aspiration pneumonia: Complete treatment with augmentin.  - CTA chest shows no PE but does show emphysema and a known nodule which is s/p SBRT  Elevated troponin: No chest pain. Likely demand ischemia in the setting of aspiration pneumonia/pneumonitis  Depression - Continue Lexapro and Cymbalta  Hypothyroidism - Continue Synthroid  Coronary artery disease - Continue aspirin, Imdur, Crestor, Ranexa, Plavix  Hyperlipidemia - Continue Crestor  Consultants: None Procedures performed: None  Disposition: Home Diet recommendation: Mechanical soft DISCHARGE MEDICATION: Allergies as of 07/30/2023   No Known Allergies      Medication List     TAKE these medications    albuterol (2.5 MG/3ML) 0.083% nebulizer solution Commonly known as: PROVENTIL Take 3 mLs (2.5 mg total) by nebulization every 2 (two) hours as needed for wheezing or shortness of breath.   albuterol 108 (90 Base) MCG/ACT inhaler Commonly known as: VENTOLIN HFA Inhale 2 puffs into the lungs every 6 (six) hours as needed for wheezing or shortness of breath.   alfuzosin 10 MG 24 hr tablet Commonly known as: UROXATRAL TAKE 1 TABLET DAILY WITH BREAKFAST.   amoxicillin-clavulanate 875-125 MG tablet Commonly known as: AUGMENTIN Take 1 tablet by mouth 2 (two) times daily for 4 days.   aspirin EC 81 MG tablet Take 1 tablet (81 mg total) by mouth daily. Swallow whole.   Breztri Aerosphere 160-9-4.8 MCG/ACT [employer]  Generic drug: Budeson-Glycopyrrol-Formoterol Inhale 2 puffs into the lungs in the morning and at bedtime.   clopidogrel 75 MG tablet Commonly known as: PLAVIX Take 1 tablet (75 mg total) by mouth daily.   docusate sodium 100 MG capsule Commonly known as: COLACE Take by mouth as directed.   DULoxetine 30 MG capsule Commonly known as: CYMBALTA Take 30 mg by mouth daily.    dutasteride 0.5 MG capsule Commonly known as: AVODART TAKE ONE CAPSULE BY MOUTH ONCE DAILY IN THE AFTERNOON.   escitalopram 20 MG tablet Commonly known as: LEXAPRO Take 20 mg by mouth every morning.   furosemide 20 MG tablet Commonly known as: LASIX Take 1 tablet (20 mg total) by mouth 2 (two) times daily. For fluid   gabapentin 100 MG capsule Commonly known as: NEURONTIN Take 100 mg by mouth 3 (three) times daily.   guaiFENesin 600 MG 12 hr tablet Commonly known as: MUCINEX Take 1 tablet (600 mg total) by mouth 2 (two) times daily.   isosorbide mononitrate 120 MG 24 hr tablet Commonly known as: IMDUR Take 1 tablet (120 mg total) by mouth every morning.   levothyroxine 75 MCG tablet Commonly known as: SYNTHROID Take 75 mcg by mouth daily.   memantine 10 MG tablet Commonly known as: NAMENDA Take 10 mg by mouth 2 (two) times daily.   nitroGLYCERIN 0.4 MG SL tablet Commonly known as: NITROSTAT Place 1 tablet (0.4 mg total) under the tongue every 5 (five) minutes as needed for chest pain.   oxycodone 30 MG immediate release tablet Commonly known as: ROXICODONE Take 30 mg by mouth every 6 (six) hours as needed for pain.   pantoprazole 40 MG tablet Commonly known as: Protonix Take 1 tablet (40 mg total) by mouth 2 (two) times daily for 14 days, THEN 1 tablet (40 mg total) daily. Start taking on: February 14, 2023   potassium chloride SA 20 MEQ tablet Commonly known as: KLOR-CON M Take 1 tablet (20 mEq total) by mouth daily.   QUEtiapine 25 MG tablet Commonly known as: SEROQUEL Take 25 mg by mouth at bedtime as needed (sleep).   ranolazine 1000 MG SR tablet Commonly known as: Ranexa Take 1 tablet (1,000 mg total) by mouth 2 (two) times daily.   risperiDONE 0.25 MG tablet Commonly known as: RISPERDAL Take 0.25 mg by mouth at bedtime.   rosuvastatin 40 MG tablet Commonly known as: CRESTOR Take 1 tablet (40 mg total) by mouth daily at 6 PM.        Follow-up  Information     [name], MD Follow up.   Specialty: Internal Medicine Contact information: [address] [place] [address] [phone]                Discharge Exam: [name]   07/29/23 2330  Weight: 96.1 kg  BP (!) 151/41 (BP Location: Left Arm)   Pulse (!) 59   Temp (!) 97.4 F (36.3 C) (Oral)   Resp 18   Ht 5\' 10"  (1.778 m)   Wt 96.1 kg   SpO2 95%   BMI 30.40 kg/m   Walked in halls remained without dyspnea nor hypoxia.  Clear, nonlabored, well-appearing elderly male  Condition at discharge: stable  The results of significant diagnostics from this hospitalization (including imaging, microbiology, ancillary and laboratory) are listed below for reference.   Imaging Studies: CT Angio Chest PE W and/or Wo Contrast  Addendum Date: 07/29/2023   ADDENDUM REPORT: 07/29/2023 22:11 ADDENDUM: Thickening of the mid  to distal esophagus, possible esophagitis. Consider endoscopy for further evaluation follow-up. Electronically Signed   By: Thornell Sartorius M.D.   On: 07/29/2023 22:11   Result Date: 07/29/2023 CLINICAL DATA:  Pulmonary embolism suspected, high probability. Chest pain and shortness of breath. Nausea. EXAM: CT ANGIOGRAPHY CHEST WITH CONTRAST TECHNIQUE: Multidetector CT imaging of the chest was performed using the standard protocol during bolus administration of intravenous contrast. Multiplanar CT image reconstructions and MIPs were obtained to evaluate the vascular anatomy. RADIATION DOSE REDUCTION: This exam was performed according to the departmental dose-optimization program which includes automated exposure control, adjustment of the mA and/or kV according to patient size and/or use of iterative reconstruction technique. CONTRAST:  OMNIPAQUE IOHEXOL 350 MG/ML SOLN COMPARISON:  12/28/2022, 03/24/2023. FINDINGS: Cardiovascular: The heart is enlarged and there is no pericardial effusion. Multi-vessel coronary artery calcifications are noted.  There is atherosclerotic calcification of the aorta without evidence of aneurysm. The pulmonary trunk is normal in caliber. No evidence of pulmonary embolism. Mediastinum/Nodes: No mediastinal or axillary lymphadenopathy. There is a prominent lymph node at the left hilum measuring 1.2 cm. The thyroid gland and trachea are within normal limits. There is diffuse thickening of the mid to distal esophagus. Lungs/Pleura: Centrilobular emphysematous changes are present in the lungs. Patchy airspace disease is noted at the lung bases bilaterally. There is a nodule in the superior segment of the left lower lobe measuring 1.4 cm. No effusion or pneumothorax. Upper Abdomen: No acute abnormality. Musculoskeletal: Sternotomy wires are noted. Degenerative changes are present in the thoracic spine. No acute osseous abnormality. Review of the MIP images confirms the above findings. IMPRESSION: 1. No evidence of pulmonary embolism. 2. Scattered atelectasis or infiltrate at the lung bases. 3. Left lower lobe pulmonary nodule measuring 1.4 cm, slightly increased from the prior exam, with findings suspicious for bronchogenic carcinoma on prior PET-CT. 4. Centrilobular emphysema. 5. Aortic atherosclerosis and coronary artery calcifications. Electronically Signed: By: Thornell Sartorius M.D. On: 07/29/2023 21:49   DG Chest Port 1 View  Result Date: 07/29/2023 CLINICAL DATA:  Chest pain shortness of breath EXAM: PORTABLE CHEST 1 VIEW COMPARISON:  05/30/2023 FINDINGS: Two frontal views of the chest demonstrates stable postsurgical changes from CABG. The cardiac silhouette is unremarkable. No acute airspace disease, effusion, or pneumothorax. Stable bibasilar scarring. No acute bony abnormality. IMPRESSION: 1. Stable chest, no acute process. Electronically Signed   By: Sharlet Salina M.D.   On: 07/29/2023 19:59    Microbiology: Results for orders placed or performed during the hospital encounter of 07/29/23  Resp panel by RT-PCR (RSV,  Flu A&B, Covid) Anterior Nasal Swab     Status: None   Collection Time: 07/29/23  7:57 PM   Specimen: Anterior Nasal Swab  Result Value Ref Range Status   SARS Coronavirus 2 by RT PCR NEGATIVE NEGATIVE Final    Comment: (NOTE) SARS-CoV-2 target nucleic acids are NOT DETECTED.  The SARS-CoV-2 RNA is generally detectable in upper respiratory specimens during the acute phase of infection. The lowest concentration of SARS-CoV-2 viral copies this assay can detect is 138 copies/mL. A negative result does not preclude SARS-Cov-2 infection and should not be used as the sole basis for treatment or other patient management decisions. A negative result may occur with  improper specimen collection/handling, submission of specimen other than nasopharyngeal swab, presence of viral mutation(s) within the areas targeted by this assay, and inadequate number of viral copies(<138 copies/mL). A negative result must be combined with clinical observations, patient history, and epidemiological  information. The expected result is Negative.  Fact Sheet for Patients:  BloggerCourse.com  Fact Sheet for Healthcare Providers:  SeriousBroker.it  This test is no t yet approved or cleared by the [place] FDA and  has been authorized for detection and/or diagnosis of SARS-CoV-2 by FDA under an Emergency Use Authorization (EUA). This EUA will remain  in effect (meaning this test can be used) for the duration of the COVID-19 declaration under Section 564(b)(1) of the Act, 21 U.S.C.section 360bbb-3(b)(1), unless the authorization is terminated  or revoked sooner.       Influenza A by PCR NEGATIVE NEGATIVE Final   Influenza B by PCR NEGATIVE NEGATIVE Final    Comment: (NOTE) The Xpert Xpress SARS-CoV-2/FLU/RSV plus assay is intended as an aid in the diagnosis of influenza from Nasopharyngeal swab specimens and should not be used as a sole basis for  treatment. Nasal washings and aspirates are unacceptable for Xpert Xpress SARS-CoV-2/FLU/RSV testing.  Fact Sheet for Patients: BloggerCourse.com  Fact Sheet for Healthcare Providers: SeriousBroker.it  This test is not yet approved or cleared by the [place] FDA and has been authorized for detection and/or diagnosis of SARS-CoV-2 by FDA under an Emergency Use Authorization (EUA). This EUA will remain in effect (meaning this test can be used) for the duration of the COVID-19 declaration under Section 564(b)(1) of the Act, 21 U.S.C. section 360bbb-3(b)(1), unless the authorization is terminated or revoked.     Resp Syncytial Virus by PCR NEGATIVE NEGATIVE Final    Comment: (NOTE) Fact Sheet for Patients: BloggerCourse.com  Fact Sheet for Healthcare Providers: SeriousBroker.it  This test is not yet approved or cleared by the [place] FDA and has been authorized for detection and/or diagnosis of SARS-CoV-2 by FDA under an Emergency Use Authorization (EUA). This EUA will remain in effect (meaning this test can be used) for the duration of the COVID-19 declaration under Section 564(b)(1) of the Act, 21 U.S.C. section 360bbb-3(b)(1), unless the authorization is terminated or revoked.  Performed at [facility], [address] [place], [place] [address]   MRSA Next Gen by PCR, Nasal     Status: None   Collection Time: 07/29/23 11:30 PM   Specimen: Nasal Mucosa; Nasal Swab  Result Value Ref Range Status   MRSA by PCR Next Gen NOT DETECTED NOT DETECTED Final    Comment: (NOTE) The GeneXpert MRSA Assay (FDA approved for NASAL specimens only), is one component of a comprehensive MRSA colonization surveillance program. It is not intended to diagnose MRSA infection nor to guide or monitor treatment for MRSA infections. Test performance is not FDA approved in patients less than  52 years old. Performed at [facility], [address], [place], [place] [address]     Labs: CBC: Recent Labs  Lab 07/29/23 1929 07/30/23 0358  WBC 10.8* 13.4*  NEUTROABS 5.4 12.5*  HGB 13.0 12.4*  HCT 40.4 37.3*  MCV 99.5 97.1  PLT 208 162   Basic Metabolic Panel: Recent Labs  Lab 07/29/23 1929 07/30/23 0358  NA 138 134*  K 3.8 3.9  CL 101 102  CO2 27 24  GLUCOSE 164* 241*  BUN 14 15  CREATININE 1.18 1.08  CALCIUM 9.1 8.3*  MG  --  2.0   Liver Function Tests: Recent Labs  Lab 07/29/23 1929 07/30/23 0358  AST 23 19  ALT 21 18  ALKPHOS 61 47  BILITOT 1.2 1.1  PROT 7.0 6.2*  ALBUMIN 4.1 3.6   CBG: Recent Labs  Lab 07/29/23  1925  GLUCAP 185*    Discharge time spent: greater than 30 minutes.  Signed: Tyrone Nine, MD [name] 07/30/2023

## 2023-07-30 NOTE — Assessment & Plan Note (Signed)
-   Continue Lexapro and Cymbalta

## 2023-07-30 NOTE — H&P (Signed)
History and Physical    Patient: Dustin [name] VHQ:[id number] DOB: 1934-05-20 DOA: 07/29/2023 DOS: the patient was seen and examined on 07/30/2023 PCP: [name], MD  Patient coming from: Home  Chief Complaint:  Chief Complaint  Patient presents with   Shortness of Breath   Chest Pain   HPI: Dustin [name] is a 89 y.o. male with medical history significant of Alzheimer's disease, carotid artery disease, coronary artery disease, hypothyroidism, hypertension, OSA, and more presents the ED with a chief complaint of aspiration.  Patient reports that he was eating dinner when he choked.  When he was eating he had solid food not liquids when he choked.  He went into a coughing fit and felt like he could not breathe.  He did not have chest pain.  He did feel short of breath.  Since then he has not had any further shortness of breath, but he is still having a cough.  He reports a productive cough with nonbloody, light green sputum.  He reports up till this aspiration event he was feeling his normal self.  He does not wear oxygen at home.  He would like to know when he can go home, but he still requiring oxygen.  He has been advised that he will first need to be able to tolerate ambulation without oxygen is much as that he would do at home.  Patient denies feeling feverish, but he is clammy.  Patient has no other complaints at this time.  Patient does not smoke, does not drink.  He is full code. Review of Systems: As mentioned in the history of present illness. All other systems reviewed and are negative. Past Medical History:  Diagnosis Date   Alzheimer disease (HCC)    Anxiety    Arthritis    Atrophic gastritis    a. By EGD 02/2013.   Carotid artery disease (HCC)    a. mild-mod plaque, <50% stenosis bilat by duplex 2018.   Coronary atherosclerosis of native coronary artery    a. Multivessel s/p CABG 1996. b. DESx2 to SVG-PDA in 2012 c. DES to distal LM in 11/2019 d. cath in 02/2021 showing  patent LM stent, patent LIMA-LAD, patent SVG-OM1 with chronically occluded jump limb to OM2 and chronic occlusion of SVG-D2 and SVG-PDA and no targets for intervention   DDD (degenerative disc disease)    Chronic back pain   Dementia (HCC)    Enlarged prostate    Essential hypertension    Hematuria    History of radiation therapy    Left Lung- 04/26/23-Dr. Antony Blackbird   Hypothyroidism    LBBB (left bundle branch block)    MI (myocardial infarction) (HCC)    Mixed hyperlipidemia    OA (osteoarthritis)    OSA (obstructive sleep apnea)    Pneumonia due to COVID-19 virus    February 2021   Sinus bradycardia    a. Aricept and BB discontinued due to this.   Past Surgical History:  Procedure Laterality Date   CATARACT EXTRACTION W/PHACO Left 03/22/2022   Procedure: CATARACT EXTRACTION PHACO AND INTRAOCULAR LENS PLACEMENT (IOC);  Surgeon: Fabio Pierce, MD;  Location: [facility];  Service: Ophthalmology;  Laterality: Left;  CDE: 25.22   COLONOSCOPY  08/03/2004   Jenkins-numerous large diverticula in the descending, transverse, descending, and sigmoid colon. Otherwise normal exam.   COLONOSCOPY  07/12/2012   RMR: External hemorrhoidal tag; multiple rectal and colonic polyps removed and/or treated as described above. Pancolonic diverticulosis. Bx-tubular adenomas, rectal hyperplastic polyp.  next colonoscopy in 06/2015.   COLONOSCOPY N/A 06/22/2016   Procedure: COLONOSCOPY;  Surgeon: Franky Macho, MD;  Location: [facility];  Service: Gastroenterology;  Laterality: N/A;  730   CORONARY ANGIOPLASTY WITH STENT PLACEMENT     CORONARY ARTERY BYPASS GRAFT  1996   LIMA to LAD, SVG to D2, SVG to PDA, SVG to OM1 and OM2   CORONARY STENT INTERVENTION N/A 12/10/2019   Procedure: CORONARY STENT INTERVENTION;  Surgeon: Kathleene Hazel, MD;  Location: [facility];  Service: Cardiovascular;  Laterality: N/A;   CORONARY ULTRASOUND/IVUS N/A 12/10/2019   Procedure: Intravascular Ultrasound/IVUS;   Surgeon: Kathleene Hazel, MD;  Location: [facility];  Service: Cardiovascular;  Laterality: N/A;   ESOPHAGOGASTRODUODENOSCOPY N/A 03/16/2013   Procedure: ESOPHAGOGASTRODUODENOSCOPY (EGD);  Surgeon: Corbin Ade, MD;  Location: [facility];  Service: Endoscopy;  Laterality: N/A;  12:00-moved to 1030 Leigh Ann notified pt   ESOPHAGOGASTRODUODENOSCOPY N/A 03/06/2013   Procedure: ESOPHAGOGASTRODUODENOSCOPY (EGD);  Surgeon: Corbin Ade, MD;  Location: [facility];  Service: Endoscopy;  Laterality: N/A;   HERNIA REPAIR     LEFT HEART CATH AND CORS/GRAFTS ANGIOGRAPHY N/A 12/10/2019   Procedure: LEFT HEART CATH AND CORS/GRAFTS ANGIOGRAPHY;  Surgeon: Kathleene Hazel, MD;  Location: [facility];  Service: Cardiovascular;  Laterality: N/A;   LEFT HEART CATH AND CORS/GRAFTS ANGIOGRAPHY N/A 03/02/2021   Procedure: LEFT HEART CATH AND CORS/GRAFTS ANGIOGRAPHY;  Surgeon: Yvonne Kendall, MD;  Location: [facility];  Service: Cardiovascular;  Laterality: N/A;   Social History:  reports that he quit smoking about 28 years ago. His smoking use included cigarettes. He started smoking about 68 years ago. He has a 80 pack-year smoking history. He has never used smokeless tobacco. He reports that he does not drink alcohol and does not use drugs.  No Known Allergies  Family History  Problem Relation Age of Onset   Heart disease Other    Heart attack Mother    Colon cancer Neg Hx     Prior to Admission medications   Medication Sig Start Date End Date Taking? Authorizing Provider  albuterol (PROVENTIL) (2.5 MG/3ML) 0.083% nebulizer solution Take 3 mLs (2.5 mg total) by nebulization every 2 (two) hours as needed for wheezing or shortness of breath. 11/24/22   Shon Hale, MD  albuterol (VENTOLIN HFA) 108 (90 Base) MCG/ACT inhaler Inhale 2 puffs into the lungs every 6 (six) hours as needed for wheezing or shortness of breath. 11/24/22   Shon Hale, MD  alfuzosin  (UROXATRAL) 10 MG 24 hr tablet TAKE 1 TABLET DAILY WITH BREAKFAST. 05/10/23   McKenzie, Mardene Celeste, MD  aspirin EC 81 MG tablet Take 1 tablet (81 mg total) by mouth daily. Swallow whole. 02/14/23   Sharlene Dory, NP  Budeson-Glycopyrrol-Formoterol (BREZTRI AEROSPHERE) 160-9-4.8 MCG/ACT AERO Inhale 2 puffs into the lungs in the morning and at bedtime. 03/16/23   Coralyn Helling, MD  clopidogrel (PLAVIX) 75 MG tablet Take 1 tablet (75 mg total) by mouth daily. 11/24/22   Shon Hale, MD  docusate sodium (COLACE) 100 MG capsule Take by mouth as directed. 03/31/23   [provider]  DULoxetine (CYMBALTA) 30 MG capsule Take 30 mg by mouth daily. 05/10/23   [provider]  dutasteride (AVODART) 0.5 MG capsule TAKE ONE CAPSULE BY MOUTH ONCE DAILY IN THE AFTERNOON. 04/04/23   Malen Gauze, MD  escitalopram (LEXAPRO) 20 MG tablet Take 20 mg by mouth every morning.  [provider]  furosemide (LASIX) 20 MG tablet Take 1 tablet (20 mg total) by mouth 2 (two) times daily. For fluid 11/24/22   Shon Hale, MD  gabapentin (NEURONTIN) 100 MG capsule Take 100 mg by mouth 3 (three) times daily. 03/04/23   [provider]  guaiFENesin (MUCINEX) 600 MG 12 hr tablet Take 1 tablet (600 mg total) by mouth 2 (two) times daily. 11/24/22   Shon Hale, MD  isosorbide mononitrate (IMDUR) 120 MG 24 hr tablet Take 1 tablet (120 mg total) by mouth every morning. 11/24/22   Shon Hale, MD  levothyroxine (SYNTHROID) 75 MCG tablet Take 75 mcg by mouth daily. 02/23/22   [provider]  memantine (NAMENDA) 10 MG tablet Take 10 mg by mouth 2 (two) times daily.  03/05/16   [provider]  nitroGLYCERIN (NITROSTAT) 0.4 MG SL tablet Place 1 tablet (0.4 mg total) under the tongue every 5 (five) minutes as needed for chest pain. 11/24/22   Shon Hale, MD  oxycodone (ROXICODONE) 30 MG immediate release tablet Take 30 mg by mouth every 6 (six) hours as needed for  pain. 09/02/21   [provider]  pantoprazole (PROTONIX) 40 MG tablet Take 1 tablet (40 mg total) by mouth 2 (two) times daily for 14 days, THEN 1 tablet (40 mg total) daily. 02/14/23 02/28/24  Sharlene Dory, NP  potassium chloride SA (KLOR-CON M) 20 MEQ tablet Take 1 tablet (20 mEq total) by mouth daily. 11/24/22   Shon Hale, MD  QUEtiapine (SEROQUEL) 25 MG tablet Take 25 mg by mouth at bedtime as needed (sleep). 11/19/19   [provider]  ranolazine (RANEXA) 1000 MG SR tablet Take 1 tablet (1,000 mg total) by mouth 2 (two) times daily. 11/24/22   Shon Hale, MD  risperiDONE (RISPERDAL) 0.25 MG tablet Take 0.25 mg by mouth at bedtime. 03/18/23   [provider]  rosuvastatin (CRESTOR) 40 MG tablet Take 1 tablet (40 mg total) by mouth daily at 6 PM. 11/24/22   Shon Hale, MD    Physical Exam: Vitals:   07/30/23 0300 07/30/23 0400 07/30/23 0500 07/30/23 0527  BP: (!) 159/54 (!) 136/46 (!) 129/39   Pulse: 72 67 61   Resp: (!) 23 (!) 21 16   Temp:  98.4 F (36.9 C)    TempSrc:  Oral    SpO2: 99% 99% 99% 99%  Weight:      Height:       1.  General: Patient lying supine in bed,  no acute distress   2. Psychiatric: Alert and oriented x 3, mood and behavior normal for situation, pleasant and cooperative with exam   3. Neurologic: Speech and language are normal, face is symmetric, moves all 4 extremities voluntarily, at baseline without acute deficits on limited exam   4. HEENMT:  Head is atraumatic, normocephalic, pupils reactive to light, neck is supple, trachea is midline, mucous membranes are moist   5. Respiratory : Lungs are clear to auscultation bilaterally without wheezing, rhonchi, rales, no cyanosis, no increase in work of breathing or accessory muscle use   6. Cardiovascular : Heart rate normal, rhythm is regular, no murmurs, rubs or gallops, no peripheral edema, peripheral pulses palpated   7. Gastrointestinal:  Abdomen is soft,  nondistended, nontender to palpation bowel sounds active, no masses or organomegaly palpated   8. Skin:  Skin is warm, clammy and intact without rashes, acute lesions, or ulcers on limited exam   9.Musculoskeletal:  No acute deformities or  trauma, no asymmetry in tone, no peripheral edema, peripheral pulses palpated, no tenderness to palpation in the extremities  Data Reviewed: In the ED Afebrile, normal heart rate, respiratory rate 9-30, blood pressure 105/52-150/74, requiring BiPAP 50% but improved to nasal cannula high flow during my exam pH 7.35, pCO2 55, pO2 57 in the ER Lactic acid initially 4.1 and improved to 2.2 EKG shows sinus rhythm with a heart rate of 84, QTc 476 Negative COVID and flu CTA chest shows no PE Is reported that when patient arrived into the ER he appeared very ill with an O2 sat of 58%.  He improved with BiPAP.  Family was able to share that he was eating dinner and then had aspiration event.  Assessment and Plan: * Acute respiratory failure with hypoxia (HCC) - Secondary to aspiration pneumonia/aspiration pneumonitis - See separate assessment and plan  Aspiration into airway - Patient was eating when he choked, cough, then seemed to have a regurgitation event - Chest x-ray is stable - Patient started on Unasyn, continue Unasyn - Could negative COVID and flu - CTA chest shows no PE but does show emphysema and a known nodule - Speech eval and treat - Continue to monitor  Elevated troponin - Troponin uptrending 20, 40, 38 - Likely demand ischemia in the setting of aspiration pneumonia/pneumonitis - No chest pain - Monitor on telemetry   Depression - Continue Lexapro and Cymbalta  Hypothyroidism - Continue Synthroid  Coronary artery disease - Continue aspirin, Imdur, Crestor, Ranexa, Plavix  Hyperlipidemia - Continue Crestor      Advance Care Planning:   Code Status: Full Code  Consults: None at this time  Family Communication: No  family at bedside  Severity of Illness: The appropriate patient status for this patient is INPATIENT. Inpatient status is judged to be reasonable and necessary in order to provide the required intensity of service to ensure the patient's safety. The patient's presenting symptoms, physical exam findings, and initial radiographic and laboratory data in the context of their chronic comorbidities is felt to place them at high risk for further clinical deterioration. Furthermore, it is not anticipated that the patient will be medically stable for discharge from the hospital within 2 midnights of admission.   * I certify that at the point of admission it is my clinical judgment that the patient will require inpatient hospital care spanning beyond 2 midnights from the point of admission due to high intensity of service, high risk for further deterioration and high frequency of surveillance required.*  Author: Lilyan Gilford, DO 07/30/2023 6:11 AM  For on call review [email or web address].

## 2023-07-30 NOTE — Plan of Care (Signed)

## 2023-07-30 NOTE — Assessment & Plan Note (Signed)
-   Secondary to aspiration pneumonia/aspiration pneumonitis - See separate assessment and plan

## 2023-08-01 ENCOUNTER — Emergency Department (HOSPITAL_COMMUNITY): Payer: Medicare HMO

## 2023-08-01 ENCOUNTER — Emergency Department (HOSPITAL_COMMUNITY)
Admission: EM | Admit: 2023-08-01 | Discharge: 2023-08-02 | Disposition: A | Discharge status: Home / Self Care | Payer: Medicare HMO | Attending: Emergency Medicine | Admitting: Emergency Medicine

## 2023-08-01 ENCOUNTER — Other Ambulatory Visit: Payer: Self-pay

## 2023-08-01 ENCOUNTER — Encounter (HOSPITAL_COMMUNITY): Payer: Self-pay | Admitting: Emergency Medicine

## 2023-08-01 DIAGNOSIS — G309 Alzheimer's disease, unspecified: Secondary | ICD-10-CM | POA: Diagnosis not present

## 2023-08-01 DIAGNOSIS — I2510 Atherosclerotic heart disease of native coronary artery without angina pectoris: Secondary | ICD-10-CM | POA: Insufficient documentation

## 2023-08-01 DIAGNOSIS — I272 Pulmonary hypertension, unspecified: Secondary | ICD-10-CM | POA: Diagnosis not present

## 2023-08-01 DIAGNOSIS — R911 Solitary pulmonary nodule: Secondary | ICD-10-CM | POA: Diagnosis not present

## 2023-08-01 DIAGNOSIS — Z7902 Long term (current) use of antithrombotics/antiplatelets: Secondary | ICD-10-CM | POA: Insufficient documentation

## 2023-08-01 DIAGNOSIS — I10 Essential (primary) hypertension: Secondary | ICD-10-CM | POA: Diagnosis not present

## 2023-08-01 DIAGNOSIS — E039 Hypothyroidism, unspecified: Secondary | ICD-10-CM | POA: Diagnosis not present

## 2023-08-01 DIAGNOSIS — Z7982 Long term (current) use of aspirin: Secondary | ICD-10-CM | POA: Diagnosis not present

## 2023-08-01 DIAGNOSIS — R0602 Shortness of breath: Secondary | ICD-10-CM | POA: Diagnosis not present

## 2023-08-01 DIAGNOSIS — J984 Other disorders of lung: Secondary | ICD-10-CM | POA: Insufficient documentation

## 2023-08-01 DIAGNOSIS — R918 Other nonspecific abnormal finding of lung field: Secondary | ICD-10-CM | POA: Diagnosis not present

## 2023-08-01 DIAGNOSIS — J9611 Chronic respiratory failure with hypoxia: Secondary | ICD-10-CM | POA: Diagnosis not present

## 2023-08-01 DIAGNOSIS — J9621 Acute and chronic respiratory failure with hypoxia: Secondary | ICD-10-CM | POA: Diagnosis not present

## 2023-08-01 LAB — BASIC METABOLIC PANEL
Anion gap: 7 (ref 5–15)
BUN: 21 mg/dL (ref 8–23)
CO2: 30 mmol/L (ref 22–32)
Calcium: 8.4 mg/dL — ABNORMAL LOW (ref 8.9–10.3)
Chloride: 99 mmol/L (ref 98–111)
Creatinine, Ser: 1.3 mg/dL — ABNORMAL HIGH (ref 0.61–1.24)
GFR, Estimated: 54 mL/min — ABNORMAL LOW (ref >60)
Glucose, Bld: 120 mg/dL — ABNORMAL HIGH (ref 70–99)
Potassium: 3.6 mmol/L (ref 3.5–5.1)
Sodium: 136 mmol/L (ref 135–145)

## 2023-08-01 LAB — CBC
HCT: 34.4 % — ABNORMAL LOW (ref 39.0–52.0)
Hemoglobin: 11.4 g/dL — ABNORMAL LOW (ref 13.0–17.0)
MCH: 32.6 pg (ref 26.0–34.0)
MCHC: 33.1 g/dL (ref 30.0–36.0)
MCV: 98.3 fL (ref 80.0–100.0)
Platelets: 178 10*3/uL (ref 150–400)
RBC: 3.5 MIL/uL — ABNORMAL LOW (ref 4.22–5.81)
RDW: 13.8 % (ref 11.5–15.5)
WBC: 8.7 10*3/uL (ref 4.0–10.5)
nRBC: 0 % (ref 0.0–0.2)

## 2023-08-01 LAB — TROPONIN I (HIGH SENSITIVITY)
Troponin I (High Sensitivity): 13 ng/L (ref <18)
Troponin I (High Sensitivity): 14 ng/L (ref <18)

## 2023-08-01 MED ORDER — ROSUVASTATIN CALCIUM 20 MG PO TABS: 40.0000 mg | ORAL_TABLET | Freq: Every day | ORAL | Status: DC

## 2023-08-01 MED ORDER — POTASSIUM CHLORIDE CRYS ER 20 MEQ PO TBCR
20.0000 meq | EXTENDED_RELEASE_TABLET | Freq: Every day | ORAL | Status: DC
  Administered 2023-08-02: 20 meq via ORAL
  Filled 2023-08-01: qty 1

## 2023-08-01 MED ORDER — GUAIFENESIN ER 600 MG PO TB12
600.0000 mg | ORAL_TABLET | Freq: Two times a day (BID) | ORAL | Status: DC
  Administered 2023-08-02 (×2): 600 mg via ORAL
  Filled 2023-08-01 (×2): qty 1

## 2023-08-01 MED ORDER — LEVOTHYROXINE SODIUM 50 MCG PO TABS: 75.0000 ug | ORAL_TABLET | Freq: Every day | ORAL | Status: DC

## 2023-08-01 MED ORDER — ALBUTEROL SULFATE (2.5 MG/3ML) 0.083% IN NEBU: 2.5000 mg | INHALATION_SOLUTION | RESPIRATORY_TRACT | Status: DC | PRN

## 2023-08-01 MED ORDER — DULOXETINE HCL 30 MG PO CPEP
30.0000 mg | ORAL_CAPSULE | Freq: Every day | ORAL | Status: DC
  Administered 2023-08-02: 30 mg via ORAL
  Filled 2023-08-01: qty 1

## 2023-08-01 MED ORDER — RISPERIDONE 0.25 MG PO TABS
0.2500 mg | ORAL_TABLET | Freq: Every day | ORAL | Status: DC
  Administered 2023-08-02: 0.25 mg via ORAL
  Filled 2023-08-01: qty 1

## 2023-08-01 MED ORDER — ISOSORBIDE MONONITRATE ER 60 MG PO TB24
120.0000 mg | ORAL_TABLET | Freq: Every morning | ORAL | Status: DC
  Administered 2023-08-02: 120 mg via ORAL
  Filled 2023-08-01: qty 2

## 2023-08-01 MED ORDER — ESCITALOPRAM OXALATE 10 MG PO TABS
20.0000 mg | ORAL_TABLET | Freq: Every morning | ORAL | Status: DC
  Administered 2023-08-02: 20 mg via ORAL
  Filled 2023-08-01: qty 2

## 2023-08-01 MED ORDER — CLOPIDOGREL BISULFATE 75 MG PO TABS
75.0000 mg | ORAL_TABLET | Freq: Every day | ORAL | Status: DC
  Administered 2023-08-02: 75 mg via ORAL
  Filled 2023-08-01: qty 1

## 2023-08-01 MED ORDER — AMOXICILLIN-POT CLAVULANATE 875-125 MG PO TABS
1.0000 | ORAL_TABLET | Freq: Two times a day (BID) | ORAL | Status: DC
  Administered 2023-08-02 (×2): 1 via ORAL
  Filled 2023-08-01 (×2): qty 1

## 2023-08-01 MED ORDER — DOCUSATE SODIUM 100 MG PO CAPS: 100.0000 mg | ORAL_CAPSULE | Freq: Every day | ORAL | Status: DC | PRN

## 2023-08-01 MED ORDER — ALFUZOSIN HCL ER 10 MG PO TB24
10.0000 mg | ORAL_TABLET | Freq: Every day | ORAL | Status: DC
  Administered 2023-08-02: 10 mg via ORAL
  Filled 2023-08-01: qty 1

## 2023-08-01 MED ORDER — QUETIAPINE FUMARATE 25 MG PO TABS: 25.0000 mg | ORAL_TABLET | Freq: Every evening | ORAL | Status: DC | PRN

## 2023-08-01 MED ORDER — RANOLAZINE ER 500 MG PO TB12
1000.0000 mg | ORAL_TABLET | Freq: Two times a day (BID) | ORAL | Status: DC
  Administered 2023-08-02 (×2): 1000 mg via ORAL
  Filled 2023-08-01 (×2): qty 2

## 2023-08-01 MED ORDER — DOCUSATE SODIUM 100 MG PO CAPS: 100.0000 mg | ORAL_CAPSULE | ORAL | Status: DC

## 2023-08-01 MED ORDER — LEVOTHYROXINE SODIUM 50 MCG PO TABS
75.0000 ug | ORAL_TABLET | Freq: Every day | ORAL | Status: DC
  Administered 2023-08-02: 75 ug via ORAL
  Filled 2023-08-01: qty 2

## 2023-08-01 MED ORDER — PANTOPRAZOLE SODIUM 40 MG PO TBEC
40.0000 mg | DELAYED_RELEASE_TABLET | Freq: Every day | ORAL | Status: DC
  Administered 2023-08-02: 40 mg via ORAL
  Filled 2023-08-01: qty 1

## 2023-08-01 MED ORDER — GABAPENTIN 100 MG PO CAPS
100.0000 mg | ORAL_CAPSULE | Freq: Three times a day (TID) | ORAL | Status: DC
  Administered 2023-08-02 (×2): 100 mg via ORAL
  Filled 2023-08-01 (×2): qty 1

## 2023-08-01 MED ORDER — MEMANTINE HCL 10 MG PO TABS
10.0000 mg | ORAL_TABLET | Freq: Two times a day (BID) | ORAL | Status: DC
  Administered 2023-08-02 (×2): 10 mg via ORAL
  Filled 2023-08-01 (×2): qty 1

## 2023-08-01 MED ORDER — FUROSEMIDE 40 MG PO TABS
20.0000 mg | ORAL_TABLET | Freq: Two times a day (BID) | ORAL | Status: DC
  Administered 2023-08-02 (×2): 20 mg via ORAL
  Filled 2023-08-01 (×2): qty 1

## 2023-08-01 NOTE — ED Notes (Signed)
Pt oxygen back down to 88% while on room air.  Placed back on 2L [place] and up to 95%.

## 2023-08-01 NOTE — ED Triage Notes (Signed)
Pt to ED from home with daughter c/o SOB and hypoxia today.  Daughter states O2 level was 82% RA.  Was given an albuterol treatment at home.  Pt supposed to have oxygen at home but has not received home oxygen yet.  Oxygen level 87% RA on arrival and placed on 2L [place] and up to 93%.  Pt A&Ox4, pale and clammy, in NAD at this time.

## 2023-08-01 NOTE — ED Provider Notes (Signed)
[place] EMERGENCY DEPARTMENT AT [facility] Provider Note   CSN: [id number] Arrival date & time: 08/01/23  1856     History {Add pertinent medical, surgical, social history, OB history to HPI:1} Chief Complaint  Patient presents with   Shortness of Breath    Dustin [name] is a 89 y.o. male.  HPI    89 y.o. male with medical history significant of Alzheimer's disease, carotid artery disease, coronary artery disease, hypothyroidism, hypertension, OSA, and more presents the ED with a chief complaint of shob.  Pt here by himself. I have called his wife's number, home phone number and also daughter-in-law's phone number however there is no response to any of this calls.  Patient states that his oxygen saturation was low and he was feeling short of breath earlier today.  He denies any new cough.  He used to be on oxygen at some point, but is never used the as needed oxygen.  Patient was recently admitted to the hospital for aspiration pneumonia. Home Medications Prior to Admission medications   Medication Sig Start Date End Date Taking? Authorizing Provider  albuterol (PROVENTIL) (2.5 MG/3ML) 0.083% nebulizer solution Take 3 mLs (2.5 mg total) by nebulization every 2 (two) hours as needed for wheezing or shortness of breath. 11/24/22   Shon Hale, MD  albuterol (VENTOLIN HFA) 108 (90 Base) MCG/ACT inhaler Inhale 2 puffs into the lungs every 6 (six) hours as needed for wheezing or shortness of breath. 11/24/22   Shon Hale, MD  alfuzosin (UROXATRAL) 10 MG 24 hr tablet TAKE 1 TABLET DAILY WITH BREAKFAST. 05/10/23   McKenzie, Mardene Celeste, MD  amoxicillin-clavulanate (AUGMENTIN) 875-125 MG tablet Take 1 tablet by mouth 2 (two) times daily for 4 days. 07/30/23 08/03/23  Tyrone Nine, MD  aspirin EC 81 MG tablet Take 1 tablet (81 mg total) by mouth daily. Swallow whole. 02/14/23   Sharlene Dory, NP  Budeson-Glycopyrrol-Formoterol (BREZTRI AEROSPHERE) 160-9-4.8 MCG/ACT AERO  Inhale 2 puffs into the lungs in the morning and at bedtime. 03/16/23   Coralyn Helling, MD  clopidogrel (PLAVIX) 75 MG tablet Take 1 tablet (75 mg total) by mouth daily. 11/24/22   Shon Hale, MD  docusate sodium (COLACE) 100 MG capsule Take by mouth as directed. 03/31/23   [provider]  DULoxetine (CYMBALTA) 30 MG capsule Take 30 mg by mouth daily. 05/10/23   [provider]  dutasteride (AVODART) 0.5 MG capsule TAKE ONE CAPSULE BY MOUTH ONCE DAILY IN THE AFTERNOON. 04/04/23   Malen Gauze, MD  escitalopram (LEXAPRO) 20 MG tablet Take 20 mg by mouth every morning.     [provider]  furosemide (LASIX) 20 MG tablet Take 1 tablet (20 mg total) by mouth 2 (two) times daily. For fluid 11/24/22   Shon Hale, MD  gabapentin (NEURONTIN) 100 MG capsule Take 100 mg by mouth 3 (three) times daily. 03/04/23   [provider]  guaiFENesin (MUCINEX) 600 MG 12 hr tablet Take 1 tablet (600 mg total) by mouth 2 (two) times daily. 11/24/22   Shon Hale, MD  isosorbide mononitrate (IMDUR) 120 MG 24 hr tablet Take 1 tablet (120 mg total) by mouth every morning. 11/24/22   Shon Hale, MD  levothyroxine (SYNTHROID) 75 MCG tablet Take 75 mcg by mouth daily. 02/23/22   [provider]  memantine (NAMENDA) 10 MG tablet Take 10 mg by mouth 2 (two) times daily.  03/05/16   [provider]  nitroGLYCERIN (NITROSTAT) 0.4 MG SL tablet Place  1 tablet (0.4 mg total) under the tongue every 5 (five) minutes as needed for chest pain. 11/24/22   Shon Hale, MD  oxycodone (ROXICODONE) 30 MG immediate release tablet Take 30 mg by mouth every 6 (six) hours as needed for pain. 09/02/21   [provider]  pantoprazole (PROTONIX) 40 MG tablet Take 1 tablet (40 mg total) by mouth 2 (two) times daily for 14 days, THEN 1 tablet (40 mg total) daily. 02/14/23 02/28/24  Sharlene Dory, NP  potassium chloride SA (KLOR-CON M) 20 MEQ tablet Take 1 tablet (20 mEq  total) by mouth daily. 11/24/22   Shon Hale, MD  QUEtiapine (SEROQUEL) 25 MG tablet Take 25 mg by mouth at bedtime as needed (sleep). 11/19/19   [provider]  ranolazine (RANEXA) 1000 MG SR tablet Take 1 tablet (1,000 mg total) by mouth 2 (two) times daily. 11/24/22   Shon Hale, MD  risperiDONE (RISPERDAL) 0.25 MG tablet Take 0.25 mg by mouth at bedtime. 03/18/23   [provider]  rosuvastatin (CRESTOR) 40 MG tablet Take 1 tablet (40 mg total) by mouth daily at 6 PM. 11/24/22   Shon Hale, MD      Allergies    Patient has no known allergies.    Review of Systems   Review of Systems  All other systems reviewed and are negative.   Physical Exam Updated Vital Signs BP 136/69   Pulse (!) 57   Temp 97.7 F (36.5 C) (Oral)   Resp 15   Ht 5\' 10"  (1.778 m)   Wt 102.1 kg   SpO2 95%   BMI 32.28 kg/m  Physical Exam Vitals and nursing note reviewed.  Constitutional:      Appearance: He is well-developed.  HENT:     Head: Atraumatic.  Cardiovascular:     Rate and Rhythm: Normal rate.  Pulmonary:     Effort: Pulmonary effort is normal.     Breath sounds: Examination of the right-lower field reveals wheezing and rales. Examination of the left-lower field reveals wheezing and rales. Wheezing and rales present. No decreased breath sounds or rhonchi.  Musculoskeletal:     Cervical back: Neck supple.     Right lower leg: No tenderness. No edema.     Left lower leg: No tenderness. No edema.  Skin:    General: Skin is warm.  Neurological:     Mental Status: He is alert and oriented to person, place, and time.     ED Results / Procedures / Treatments   Labs (all labs ordered are listed, but only abnormal results are displayed) Labs Reviewed  CBC - Abnormal; Notable for the following components:      Result Value   RBC 3.50 (*)    Hemoglobin 11.4 (*)    HCT 34.4 (*)    All other components within normal limits  BASIC METABOLIC PANEL -  Abnormal; Notable for the following components:   Glucose, Bld 120 (*)    Creatinine, Ser 1.30 (*)    Calcium 8.4 (*)    GFR, Estimated 54 (*)    All other components within normal limits  TROPONIN I (HIGH SENSITIVITY)  TROPONIN I (HIGH SENSITIVITY)    EKG EKG Interpretation Date/Time:  Monday August 01 2023 19:15:19 EDT Ventricular Rate:  61 PR Interval:  168 QRS Duration:  142 QT Interval:  452 QTC Calculation: 456 R Axis:   24  Text Interpretation: Sinus rhythm IVCD, consider atypical LBBB No acute changes NO SIGNIFICANT CHANGE SINCE  LAST TRACING YESTERDAY Confirmed by Derwood Kaplan [id number]) on 08/01/2023 7:57:43 PM  Radiology DG Chest Port 1 View  Result Date: 08/01/2023 CLINICAL DATA:  Shortness of breath and hypoxia EXAM: PORTABLE CHEST 1 VIEW COMPARISON:  07/29/2023 FINDINGS: Prior CABG. Atherosclerotic calcification of the aortic arch. Moderate enlargement of the cardiopericardial silhouette. Mildly indistinct pulmonary vasculature compatible with pulmonary venous hypertension. No Kerley B lines or definitive findings of interstitial edema. No blunting of the costophrenic angles. Suspected airway thickening with indistinct patchy densities at the lung bases not substantially changed from 07/29/2023. IMPRESSION: 1. Moderate enlargement of the cardiopericardial silhouette with pulmonary venous hypertension but no overt interstitial edema. 2. Airway thickening is present, which may reflect bronchitis or reactive airways disease. 3. Indistinct patchy densities at the lung bases, not substantially changed from 07/29/2023. 4. The patient has a known 1.4 cm left lower lobe nodule which is not well visualized on today's exam, this lesion was metabolically active on 03/24/2023 PET-CT concerning for malignancy, and was visible and described on the CTA chest 07/29/2023. Electronically Signed   By: Gaylyn Rong M.D.   On: 08/01/2023 20:23    Procedures Procedures  {Document cardiac  monitor, telemetry assessment procedure when appropriate:1}  Medications Ordered in ED Medications - No data to display  ED Course/ Medical Decision Making/ A&P   {   Click here for ABCD2, HEART and other calculatorsREFRESH Note before signing :1}                              Medical Decision Making Amount and/or Complexity of Data Reviewed Labs: ordered. Radiology: ordered.   89 y.o. male with medical history significant of Alzheimer's disease, carotid artery disease, coronary artery disease, hypothyroidism, hypertension, OSA, and more presents the ED with a chief complaint of shortness of breath.  The complaint involves an extensive differential diagnosis and also carries with it a high risk of complications and morbidity.    The differential diagnosis includes : Aspiration pneumonitis, pulmonary edema, pleural effusion, CHF exacerbation, pneumonia, severe anemia, worsening primary lung disease.  The initial plan is to get x-ray of the chest and give patient DuoNeb treatment.  He does not appear to be in any respiratory distress at this time.   Additional history obtained: Records reviewed previous admission documents  It is not entirely clear if he was supposed to be discharged with oxygen or not.  Per triage note, family is expecting oxygen.  Again, I called family but there was no response.  Independent labs interpretation:  The following labs were independently interpreted: CBC and BMP is normal.  Troponin is reassuring.  Independent visualization and interpretation of imaging: - I independently visualized the following imaging with scope of interpretation limited to determining acute life threatening conditions related to emergency care: No evidence of interstitial edema on chest x-ray  Treatment and Reassessment: Patient reassessed. I turned off the oxygen, and his O2 sats do drop to about 82%. Multiple attempts to reach family did not work, ultimately I received phone  number for Ms. Marylene Land which is [phone].  At the time of discharge from recent admission, there was evidence that patient will need oxygen but there has not been any steps taken thereafter for patient's family to get oxygen.  They have noticed that his O2 sats dropped into 60s or 70s and it is affecting patient's ability to take care of himself.  It appears to me that patient likely needs continuous  oxygen.  I have reviewed the CT chest from last visit.  It was negative for blood clots however there was evidence of emphysema.  Consultation: - Consulted or discussed management/test interpretation with external professional: [facility].   Final Clinical Impression(s) / ED Diagnoses Final diagnoses:  Chronic hypoxic respiratory failure (HCC)  Chronic lung disease    Rx / DC Orders ED Discharge Orders     None

## 2023-08-02 NOTE — ED Notes (Signed)
Pt given gown, repositioned in bed, condom catheter in place with drainage bag, tv turned on per request and call bell in reach, denies other needs at this time.

## 2023-08-02 NOTE — ED Notes (Signed)
Patient lying on stretcher with eyes closed. Respirations even and unlabored. NAD. VSS.

## 2023-08-02 NOTE — ED Notes (Signed)
Patient remains on stretcher with eyes closed. Respirations even and unlabored. NAD. VSS.

## 2023-08-02 NOTE — ED Notes (Signed)
Patient updated on plan of care (CSW working on oxygen delivery and then patient discharge). Patient verbalized understanding.

## 2023-08-02 NOTE — ED Provider Notes (Signed)
SATURATION QUALIFICATIONS: (This note is used to comply with regulatory documentation for home oxygen)   Patient Saturations on Room Air at Rest = 94%   Patient Saturations on Room Air while Ambulating = 87%   Patient Saturations on 2 Liters of oxygen while Ambulating = 97%   Please briefly explain why patient needs home oxygen: Patient's oxygen saturation decreases with ambulation on room air.    Glendora Score, MD 08/02/23 [phone]

## 2023-08-02 NOTE — ED Notes (Signed)
Patient sitting up on stretching talking on cell phone at this time.

## 2023-08-02 NOTE — ED Notes (Signed)
Home o2 delivered. Pt's daughter states she is on her way to pick him up.

## 2023-08-02 NOTE — ED Notes (Signed)
This RN assumed care of patient at 0700. Patient is lying on stretcher with eyes closed. Respirations even and unlabored. NAD. Non-toxic in appearing. O2 on 2LPM. VSS.

## 2023-08-02 NOTE — ED Notes (Cosign Needed)
SATURATION QUALIFICATIONS: (This note is used to comply with regulatory documentation for home oxygen)  Patient Saturations on Room Air at Rest = 94%  Patient Saturations on Room Air while Ambulating = 87%  Patient Saturations on 2 Liters of oxygen while Ambulating = 97%  Please briefly explain why patient needs home oxygen: Patient's oxygen saturation decreases with ambulation on room air.

## 2023-08-02 NOTE — ED Notes (Addendum)
Patient's home oxygen delivered at this time. CSW notified. Oncoming RN added to secure chat.

## 2023-08-02 NOTE — ED Notes (Signed)
Breakfast tray provided to patient. Patient reports not being hungry at this time.

## 2023-08-02 NOTE — ED Notes (Addendum)
TOC consulted for DME needs and request to speak with Ceasar Mons. CSW spoke to [place] who states she was informed pt needs home O2. CSW spoke about referral to [employer] [facility] for home O2 set up, they are agreeable and interested in portable. CSW explained that a comment can be placed in the order by the MD for this and [employer] will eval. Ms. Wolbers is understanding and agreeable. MD placed DME O2 order and Sat qual completed. CSW spoke to [place] with [employer] who states it is being sent to processing and he will get the delivery expedited. O2 has been delivered to room. CSW updated MD and RN that TOC has cleared pt from our end for D/C. TOC signing off.

## 2023-08-02 NOTE — ED Notes (Signed)
Patient's daughter, Marylene Land, verbalized understanding of all discharge instructions. Patient discharged home in stable condition on oxygen 2L/[place] and home O2 supplies.

## 2023-08-03 ENCOUNTER — Other Ambulatory Visit: Payer: Self-pay | Admitting: Urology

## 2023-08-03 DIAGNOSIS — N401 Enlarged prostate with lower urinary tract symptoms: Secondary | ICD-10-CM

## 2023-08-09 ENCOUNTER — Telehealth: Payer: Self-pay | Admitting: *Deleted

## 2023-08-09 NOTE — Telephone Encounter (Signed)
Transition Care Management Follow-up Telephone Call Date of discharge and from where: [facility] 08/02/2023 How have you been since you were released from the hospital? Feeling better Any questions or concerns? No   Items Reviewed: Did the pt receive and understand the discharge instructions provided? Yes  Medications obtained and verified? No  Other? No  Any new allergies since your discharge? No  Dietary orders reviewed? No Do you have support at home? Yes     Follow up appointments  PCP Hospital f/u appt confirmed? Has not scheduled yet  Are transportation arrangements needed? No  If their condition worsens, is the pt aware to call PCP or go to the Emergency Dept.? Yes Was the patient provided with contact information for the PCP's office or ED? Yes Was to pt encouraged to call back with questions or concerns? Yes

## 2023-09-08 ENCOUNTER — Telehealth: Payer: Self-pay | Admitting: *Deleted

## 2023-09-08 NOTE — Telephone Encounter (Signed)
CALLED PATIENT TO INFORM OF CT FOR 09-20-23- ARRIVAL TIME- 1:15 PM @ [facility], NO RESTRICTIONS TO TEST, PATIENT TO RECEIVE RESULTS FROM DR. KINARD ON 09-26-23 @ 10:45 AM, SPOKE WITH PATIENT AND HE IS AWARE OF THESE APPTS. AND THE INSTRUCTIONS

## 2023-09-09 DIAGNOSIS — M25561 Pain in right knee: Secondary | ICD-10-CM | POA: Diagnosis not present

## 2023-09-09 DIAGNOSIS — M25572 Pain in left ankle and joints of left foot: Secondary | ICD-10-CM | POA: Diagnosis not present

## 2023-09-09 DIAGNOSIS — M25571 Pain in right ankle and joints of right foot: Secondary | ICD-10-CM | POA: Diagnosis not present

## 2023-09-20 ENCOUNTER — Ambulatory Visit (HOSPITAL_COMMUNITY)
Admission: RE | Admit: 2023-09-20 | Discharge: 2023-09-20 | Disposition: A | Discharge status: Home / Self Care | Payer: Medicare HMO | Source: Ambulatory Visit | Attending: Radiation Oncology | Admitting: Radiation Oncology

## 2023-09-20 DIAGNOSIS — J432 Centrilobular emphysema: Secondary | ICD-10-CM | POA: Diagnosis not present

## 2023-09-20 DIAGNOSIS — C3490 Malignant neoplasm of unspecified part of unspecified bronchus or lung: Secondary | ICD-10-CM | POA: Diagnosis not present

## 2023-09-20 DIAGNOSIS — C3432 Malignant neoplasm of lower lobe, left bronchus or lung: Secondary | ICD-10-CM | POA: Diagnosis not present

## 2023-09-20 DIAGNOSIS — I700 Atherosclerosis of aorta: Secondary | ICD-10-CM | POA: Diagnosis not present

## 2023-09-25 NOTE — Progress Notes (Signed)
Radiation Oncology         [phone] ________________________________  Name: Dustin [name] MRN: [id number]  Date: 09/26/2023  DOB: 02-Nov-1934  Follow-Up Visit Note  CC: [name], MD  [name], MD  No diagnosis found.  Diagnosis:  The encounter diagnosis was Primary malignant neoplasm of left lower lobe of lung.   PET positive solitary lung nodule in the left lower lung   Presumptive clinical Stage I non-small cell lung cancer   Interval Since Last Radiation: 4 months and 24 days   Indication for treatment: Curative       Radiation treatment dates: 04/26/23 through 05/02/23 Site/dose: Left Lung - 54 Gy delivered in 3 Fx at 18 Gy/Fx Beams/energy:  6X-FFF Technique/Mode: SBRT/SRT-IMRT, Photon   Narrative:  The patient returns today for routine follow-up and to review recent imaging. He was last seen here for follow-up on 06/20/23.   Since his last visit, the patient was briefly hospitalized in the setting of acute respiratory failure with hypoxia from 07/29/23 through 07/30/23 after presenting to the ED with c/o aspiration while eating solid food with subsequent SOB. Hospital course included Augmentin and supplemental O2. He was advised to order at home oxygen at discharge however the patient's family did not follow-up on this. Imaging performed while inpatient included a CTA of the chest which showed no evidence of PE and a slight interval increase in size of the LLL pulmonary nodule measuring 1.4 cm.   He returned to the ED several days later on 08/01/23 with complaints of recurrent SOB secondary to hypoxic respiratory failure. Multiple attempts were made to reach the patient's family to help arrange for at home oxygen however no one would answer. Someone was finally contacted and a request was made for at home O2 at discharge.   His most recent chest CT without contast on 09/20/23 demonstrates: ***.     Allergies:  has No Known Allergies.  Meds: Current  Outpatient Medications  Medication Sig Dispense Refill   albuterol (PROVENTIL) (2.5 MG/3ML) 0.083% nebulizer solution Take 3 mLs (2.5 mg total) by nebulization every 2 (two) hours as needed for wheezing or shortness of breath. 75 mL 3   albuterol (VENTOLIN HFA) 108 (90 Base) MCG/ACT inhaler Inhale 2 puffs into the lungs every 6 (six) hours as needed for wheezing or shortness of breath. 18 g 3   alfuzosin (UROXATRAL) 10 MG 24 hr tablet TAKE 1 TABLET DAILY WITH BREAKFAST. 30 tablet 0   aspirin EC 81 MG tablet Take 1 tablet (81 mg total) by mouth daily. Swallow whole. 90 tablet 3   Budeson-Glycopyrrol-Formoterol (BREZTRI AEROSPHERE) 160-9-4.8 MCG/ACT AERO Inhale 2 puffs into the lungs in the morning and at bedtime. 10.7 g 5   clopidogrel (PLAVIX) 75 MG tablet Take 1 tablet (75 mg total) by mouth daily. 30 tablet 11   docusate sodium (COLACE) 100 MG capsule Take by mouth as directed.     DULoxetine (CYMBALTA) 30 MG capsule Take 30 mg by mouth daily.     dutasteride (AVODART) 0.5 MG capsule TAKE ONE CAPSULE BY MOUTH ONCE DAILY IN THE AFTERNOON. 90 capsule 0   escitalopram (LEXAPRO) 20 MG tablet Take 20 mg by mouth every morning.      furosemide (LASIX) 20 MG tablet Take 1 tablet (20 mg total) by mouth 2 (two) times daily. For fluid 60 tablet 5   gabapentin (NEURONTIN) 100 MG capsule Take 100 mg by mouth 3 (three) times daily.     guaiFENesin (MUCINEX) 600  MG 12 hr tablet Take 1 tablet (600 mg total) by mouth 2 (two) times daily. 20 tablet 0   isosorbide mononitrate (IMDUR) 120 MG 24 hr tablet Take 1 tablet (120 mg total) by mouth every morning. 30 tablet 11   levothyroxine (SYNTHROID) 75 MCG tablet Take 75 mcg by mouth daily.     memantine (NAMENDA) 10 MG tablet Take 10 mg by mouth 2 (two) times daily.      nitroGLYCERIN (NITROSTAT) 0.4 MG SL tablet Place 1 tablet (0.4 mg total) under the tongue every 5 (five) minutes as needed for chest pain. 15 tablet 5   oxycodone (ROXICODONE) 30 MG immediate  release tablet Take 30 mg by mouth every 6 (six) hours as needed for pain.     pantoprazole (PROTONIX) 40 MG tablet Take 1 tablet (40 mg total) by mouth 2 (two) times daily for 14 days, THEN 1 tablet (40 mg total) daily. 388 tablet 0   potassium chloride SA (KLOR-CON M) 20 MEQ tablet Take 1 tablet (20 mEq total) by mouth daily. 30 tablet 3   promethazine (PHENERGAN) 25 MG tablet Take 25 mg by mouth 4 (four) times daily as needed.     QUEtiapine (SEROQUEL) 25 MG tablet Take 25 mg by mouth at bedtime as needed (sleep).     ranolazine (RANEXA) 1000 MG SR tablet Take 1 tablet (1,000 mg total) by mouth 2 (two) times daily. 60 tablet 5   ranolazine (RANEXA) 500 MG 12 hr tablet Take 500 mg by mouth 2 (two) times daily.     risperiDONE (RISPERDAL) 0.25 MG tablet Take 0.25 mg by mouth at bedtime.     rosuvastatin (CRESTOR) 40 MG tablet Take 1 tablet (40 mg total) by mouth daily at 6 PM. 30 tablet 5   No current facility-administered medications for this encounter.    Physical Findings: The patient is in no acute distress. Patient is alert and oriented.  vitals were not taken for this visit. .  No significant changes. Lungs are clear to auscultation bilaterally. Heart has regular rate and rhythm. No palpable cervical, supraclavicular, or axillary adenopathy. Abdomen soft, non-tender, normal bowel sounds.   Lab Findings: Lab Results  Component Value Date   WBC 8.7 08/01/2023   HGB 11.4 (L) 08/01/2023   HCT 34.4 (L) 08/01/2023   MCV 98.3 08/01/2023   PLT 178 08/01/2023    Radiographic Findings: No results found.  Impression:  The encounter diagnosis was Primary malignant neoplasm of left lower lobe of lung.   PET positive solitary lung nodule in the left lower lung   Presumptive clinical Stage I non-small cell lung cancer   The patient is recovering from the effects of radiation.  ***  Plan:  ***   *** minutes of total time was spent for this patient encounter, including preparation,  face-to-face counseling with the patient and coordination of care, physical exam, and documentation of the encounter. ____________________________________  Billie Lade, PhD, MD  This document serves as a record of services personally performed by Antony Blackbird, MD. It was created on his behalf by Neena Rhymes, a trained medical scribe. The creation of this record is based on the scribe's personal observations and the provider's statements to them. This document has been checked and approved by the attending provider.

## 2023-09-26 ENCOUNTER — Encounter: Payer: Self-pay | Admitting: Radiation Oncology

## 2023-09-26 ENCOUNTER — Ambulatory Visit
Admission: RE | Admit: 2023-09-26 | Discharge: 2023-09-26 | Disposition: A | Discharge status: Home / Self Care | Payer: Medicare HMO | Source: Ambulatory Visit | Attending: Radiation Oncology | Admitting: Radiation Oncology

## 2023-09-26 VITALS — BP 119/54 | HR 55 | Temp 97.7°F | Resp 18 | Ht 70.0 in | Wt 217.0 lb

## 2023-09-26 DIAGNOSIS — Z923 Personal history of irradiation: Secondary | ICD-10-CM | POA: Diagnosis not present

## 2023-09-26 DIAGNOSIS — N281 Cyst of kidney, acquired: Secondary | ICD-10-CM | POA: Diagnosis not present

## 2023-09-26 DIAGNOSIS — I700 Atherosclerosis of aorta: Secondary | ICD-10-CM | POA: Insufficient documentation

## 2023-09-26 DIAGNOSIS — Z79899 Other long term (current) drug therapy: Secondary | ICD-10-CM | POA: Insufficient documentation

## 2023-09-26 DIAGNOSIS — C3432 Malignant neoplasm of lower lobe, left bronchus or lung: Secondary | ICD-10-CM

## 2023-09-26 DIAGNOSIS — Z7982 Long term (current) use of aspirin: Secondary | ICD-10-CM | POA: Diagnosis not present

## 2023-09-26 DIAGNOSIS — F17210 Nicotine dependence, cigarettes, uncomplicated: Secondary | ICD-10-CM | POA: Diagnosis not present

## 2023-09-26 DIAGNOSIS — J432 Centrilobular emphysema: Secondary | ICD-10-CM | POA: Diagnosis not present

## 2023-09-26 DIAGNOSIS — Z85118 Personal history of other malignant neoplasm of bronchus and lung: Secondary | ICD-10-CM | POA: Insufficient documentation

## 2023-09-26 DIAGNOSIS — Z7902 Long term (current) use of antithrombotics/antiplatelets: Secondary | ICD-10-CM | POA: Insufficient documentation

## 2023-09-26 NOTE — Progress Notes (Signed)
Dustin [name] is here today for follow up post radiation to the lung.  Lung Side: Left  Does the patient complain of any of the following:  Pain: Reports pain all over Shortness of breath w/wo exertion: No Cough: No Hemoptysis: No Pain with swallowing: No Swallowing/choking concerns: Choking concerns Appetite: Good Energy Level: Low Post radiation skin Changes: No     BP (!) 119/54 (BP Location: Left Arm, Patient Position: Sitting)   Pulse (!) 55   Temp 97.7 F (36.5 C) (Temporal)   Resp 18   Ht 5\' 10"  (1.778 m)   Wt 217 lb (98.4 kg)   PF 96 L/min   BMI 31.14 kg/m

## 2023-09-28 DIAGNOSIS — M48061 Spinal stenosis, lumbar region without neurogenic claudication: Secondary | ICD-10-CM | POA: Diagnosis not present

## 2023-09-28 DIAGNOSIS — G894 Chronic pain syndrome: Secondary | ICD-10-CM | POA: Diagnosis not present

## 2023-09-28 DIAGNOSIS — G309 Alzheimer's disease, unspecified: Secondary | ICD-10-CM | POA: Diagnosis not present

## 2023-09-28 DIAGNOSIS — J449 Chronic obstructive pulmonary disease, unspecified: Secondary | ICD-10-CM | POA: Diagnosis not present

## 2023-09-28 DIAGNOSIS — E6609 Other obesity due to excess calories: Secondary | ICD-10-CM | POA: Diagnosis not present

## 2023-09-28 DIAGNOSIS — I5032 Chronic diastolic (congestive) heart failure: Secondary | ICD-10-CM | POA: Diagnosis not present

## 2023-09-28 DIAGNOSIS — I209 Angina pectoris, unspecified: Secondary | ICD-10-CM | POA: Diagnosis not present

## 2023-09-28 DIAGNOSIS — Z6830 Body mass index (BMI) 30.0-30.9, adult: Secondary | ICD-10-CM | POA: Diagnosis not present

## 2023-09-30 ENCOUNTER — Encounter: Payer: Self-pay | Admitting: Primary Care

## 2023-09-30 ENCOUNTER — Ambulatory Visit: Payer: Medicare HMO | Admitting: Primary Care

## 2023-09-30 NOTE — Progress Notes (Deleted)
@Patient  ID: Dustin [name], male    DOB: 01/22/1934, 89 y.o.   MRN: [id number]  No chief complaint on file.   Referring provider: [name], MD  HPI:  Previous [facility] pulmonary encounter: 07/05/23- Dr. Gerilyn Pilgrim on XRT with Dr. Roselind Messier.   He has trouble with his balance and walking from neuropathy.  Gets winded when walking also.  Gets a cough with clear sputum, especially in the morning.  Has trouble with his breathing until he is able to clear the phlegm.  No having wheeze, chest pain, hemoptysis, fever, or leg swelling.   Lowest SpO2 on room air while walking was 88%, and he could only walk 1.5 laps.  He was recovered on 2 liters oxygen to keep SpO2 > 92%.   Lt lower lobe lung nodule presumably from Stage 1 lung cancer. - s/p SBRT with Dr. Roselind Messier - follow up imaging through radiation oncology   COPD. - breztri two puffs bid - prn albuterol - he has a nebulizer - prn mucinex - will arrange for a flutter valve - defer PFT for now since results not likely to change management at this point   Chronic respiratory failure with hypoxia. - will arrange for 2 liters oxygen with exertion and sleep - will arrange for a POC  09/30/2023- interim hx  Patient presents today for follow-up.  He follows with Dr. Craige Cotta for history of COPD and chronic respiratory failure, he was last seen in July 2024.  He qualified for oxygen July 2024, needing 2 L of oxygen with exertion.   PFTs deferred as results not likely felt to change management at this time. He takes Lasix twice daily.  He was seen in ED twice in August for shortness of breath/respiratory failure. He was treated for aspiration pneumonia.  Symptoms resolved quickly.  CTA showed no pulmonary embolism, emphysema and known lung nodule status post SBRT.  He was discharged on Augmentin for total of 5 days.  Maintained on Breztri Aerosphere 2 puffs twice daily, as needed albuterol, prn muicnex and flutter valve. Wearing 2L oxygen at  bedtime and with exertion.     No Known Allergies  Immunization History  Administered Date(s) Administered   Fluad Quad(high Dose 65+) 10/31/2021   Influenza-Unspecified 08/27/2014   Tdap 10/01/2021    Past Medical History:  Diagnosis Date   Alzheimer disease (HCC)    Anxiety    Arthritis    Atrophic gastritis    a. By EGD 02/2013.   Carotid artery disease (HCC)    a. mild-mod plaque, <50% stenosis bilat by duplex 2018.   Coronary atherosclerosis of native coronary artery    a. Multivessel s/p CABG 1996. b. DESx2 to SVG-PDA in 2012 c. DES to distal LM in 11/2019 d. cath in 02/2021 showing patent LM stent, patent LIMA-LAD, patent SVG-OM1 with chronically occluded jump limb to OM2 and chronic occlusion of SVG-D2 and SVG-PDA and no targets for intervention   DDD (degenerative disc disease)    Chronic back pain   Dementia (HCC)    Enlarged prostate    Essential hypertension    Hematuria    History of radiation therapy    Left Lung- 04/26/23-Dr. Antony Blackbird   Hypothyroidism    LBBB (left bundle branch block)    MI (myocardial infarction) (HCC)    Mixed hyperlipidemia    OA (osteoarthritis)    OSA (obstructive sleep apnea)    Pneumonia due to COVID-19 virus    February 2021  Sinus bradycardia    a. Aricept and BB discontinued due to this.    Tobacco History: Social History   Tobacco Use  Smoking Status Former   Current packs/day: 0.00   Average packs/day: 2.0 packs/day for 40.0 years (80.0 ttl pk-yrs)   Types: Cigarettes   Start date: 12/27/1954   Quit date: 12/27/1994   Years since quitting: 28.7  Smokeless Tobacco Never   Counseling given: Not Answered   Outpatient Medications Prior to Visit  Medication Sig Dispense Refill   albuterol (PROVENTIL) (2.5 MG/3ML) 0.083% nebulizer solution Take 3 mLs (2.5 mg total) by nebulization every 2 (two) hours as needed for wheezing or shortness of breath. 75 mL 3   albuterol (VENTOLIN HFA) 108 (90 Base) MCG/ACT inhaler  Inhale 2 puffs into the lungs every 6 (six) hours as needed for wheezing or shortness of breath. 18 g 3   alfuzosin (UROXATRAL) 10 MG 24 hr tablet TAKE 1 TABLET DAILY WITH BREAKFAST. 30 tablet 0   aspirin EC 81 MG tablet Take 1 tablet (81 mg total) by mouth daily. Swallow whole. 90 tablet 3   Budeson-Glycopyrrol-Formoterol (BREZTRI AEROSPHERE) 160-9-4.8 MCG/ACT AERO Inhale 2 puffs into the lungs in the morning and at bedtime. 10.7 g 5   clopidogrel (PLAVIX) 75 MG tablet Take 1 tablet (75 mg total) by mouth daily. 30 tablet 11   docusate sodium (COLACE) 100 MG capsule Take by mouth as directed.     DULoxetine (CYMBALTA) 30 MG capsule Take 30 mg by mouth daily.     dutasteride (AVODART) 0.5 MG capsule TAKE ONE CAPSULE BY MOUTH ONCE DAILY IN THE AFTERNOON. 90 capsule 0   escitalopram (LEXAPRO) 20 MG tablet Take 20 mg by mouth every morning.      furosemide (LASIX) 20 MG tablet Take 1 tablet (20 mg total) by mouth 2 (two) times daily. For fluid 60 tablet 5   gabapentin (NEURONTIN) 100 MG capsule Take 100 mg by mouth 3 (three) times daily.     guaiFENesin (MUCINEX) 600 MG 12 hr tablet Take 1 tablet (600 mg total) by mouth 2 (two) times daily. 20 tablet 0   isosorbide mononitrate (IMDUR) 120 MG 24 hr tablet Take 1 tablet (120 mg total) by mouth every morning. 30 tablet 11   levothyroxine (SYNTHROID) 75 MCG tablet Take 75 mcg by mouth daily.     memantine (NAMENDA) 10 MG tablet Take 10 mg by mouth 2 (two) times daily.      nitroGLYCERIN (NITROSTAT) 0.4 MG SL tablet Place 1 tablet (0.4 mg total) under the tongue every 5 (five) minutes as needed for chest pain. 15 tablet 5   oxycodone (ROXICODONE) 30 MG immediate release tablet Take 30 mg by mouth every 6 (six) hours as needed for pain.     pantoprazole (PROTONIX) 40 MG tablet Take 1 tablet (40 mg total) by mouth 2 (two) times daily for 14 days, THEN 1 tablet (40 mg total) daily. 388 tablet 0   potassium chloride SA (KLOR-CON M) 20 MEQ tablet Take 1 tablet  (20 mEq total) by mouth daily. 30 tablet 3   promethazine (PHENERGAN) 25 MG tablet Take 25 mg by mouth 4 (four) times daily as needed.     QUEtiapine (SEROQUEL) 25 MG tablet Take 25 mg by mouth at bedtime as needed (sleep).     ranolazine (RANEXA) 1000 MG SR tablet Take 1 tablet (1,000 mg total) by mouth 2 (two) times daily. 60 tablet 5   ranolazine (RANEXA) 500 MG 12 hr  tablet Take 500 mg by mouth 2 (two) times daily.     risperiDONE (RISPERDAL) 0.25 MG tablet Take 0.25 mg by mouth at bedtime.     rosuvastatin (CRESTOR) 40 MG tablet Take 1 tablet (40 mg total) by mouth daily at 6 PM. 30 tablet 5   No facility-administered medications prior to visit.      Review of Systems  Review of Systems   Physical Exam  There were no vitals taken for this visit. Physical Exam   Lab Results:  CBC    Component Value Date/Time   WBC 8.7 08/01/2023 1935   RBC 3.50 (L) 08/01/2023 1935   HGB 11.4 (L) 08/01/2023 1935   HGB 12.7 (L) 02/25/2021 1258   HCT 34.4 (L) 08/01/2023 1935   HCT 38.7 02/25/2021 1258   HCT 43 01/26/2013 0000   PLT 178 08/01/2023 1935   PLT 206 02/25/2021 1258   MCV 98.3 08/01/2023 1935   MCV 93 02/25/2021 1258   MCH 32.6 08/01/2023 1935   MCHC 33.1 08/01/2023 1935   RDW 13.8 08/01/2023 1935   RDW 12.8 02/25/2021 1258   LYMPHSABS 0.5 (L) 07/30/2023 0358   LYMPHSABS 2.3 02/25/2021 1258   MONOABS 0.4 07/30/2023 0358   EOSABS 0.0 07/30/2023 0358   EOSABS 0.1 02/25/2021 1258   BASOSABS 0.0 07/30/2023 0358   BASOSABS 0.0 02/25/2021 1258    BMET    Component Value Date/Time   NA 136 08/01/2023 1935   NA 140 02/25/2021 1258   K 3.6 08/01/2023 1935   K 3.9 01/26/2013 0000   CL 99 08/01/2023 1935   CL 100 01/26/2013 0000   CO2 30 08/01/2023 1935   CO2 31 01/26/2013 0000   GLUCOSE 120 (H) 08/01/2023 1935   BUN 21 08/01/2023 1935   BUN 21 02/25/2021 1258   CREATININE 1.30 (H) 08/01/2023 1935   CREATININE 0.80 01/26/2013 0000   CALCIUM 8.4 (L) 08/01/2023 1935    GFRNONAA 54 (L) 08/01/2023 1935   GFRAA >60 09/21/2020 1932    BNP    Component Value Date/Time   BNP 126.0 (H) 07/29/2023 1927    ProBNP    Component Value Date/Time   PROBNP 1,378.0 (H) 08/30/2014 1200    Imaging: CT CHEST WO CONTRAST  Result Date: 09/26/2023 CLINICAL DATA:  Non-small-cell lung cancer. Restaging. * Tracking Code: BO * EXAM: CT CHEST WITHOUT CONTRAST TECHNIQUE: Multidetector CT imaging of the chest was performed following the standard protocol without IV contrast. RADIATION DOSE REDUCTION: This exam was performed according to the departmental dose-optimization program which includes automated exposure control, adjustment of the mA and/or kV according to patient size and/or use of iterative reconstruction technique. COMPARISON:  Chest CT 07/29/2023.  Head CT 03/24/2023 FINDINGS: Cardiovascular: The heart size is normal. No substantial pericardial effusion. Coronary artery calcification is evident. Moderate atherosclerotic calcification is noted in the wall of the thoracic aorta. Status post CABG. Mediastinum/Nodes: No mediastinal lymphadenopathy. No evidence for gross hilar lymphadenopathy although assessment is limited by the lack of intravenous contrast on the current study. The esophagus has normal imaging features. There is no axillary lymphadenopathy. Lungs/Pleura: Centrilobular and paraseptal emphysema evident. Left lower lobe subpleural pulmonary nodule has decreased since previous PET-CT measuring 13 x 7 mm today compared to 19 x 9 mm when remeasured in a similar fashion on the previous study. There is some consolidative and ground-glass airspace disease peripherally in the adjacent left upper and lower lobes, presumably reflecting of all vein post radiation change. This does have  a somewhat nodular character and infectious/inflammatory etiology is not entirely excluded. No pleural effusion. Upper Abdomen: Stable right renal cyst. Musculoskeletal: No worrisome lytic or  sclerotic osseous abnormality. IMPRESSION: 1. Interval decrease in size of the left lower lobe subpleural pulmonary nodule. 2. Interval development of consolidative and ground-glass airspace disease peripherally in the adjacent left upper and lower lobes, presumably reflecting post radiation change. This does have a somewhat nodular character and infectious/inflammatory etiology is not entirely excluded. 3. Aortic Atherosclerosis (ICD10-I70.0) and Emphysema (ICD10-J43.9). Electronically Signed   By: Kennith Center M.D.   On: 09/26/2023 10:03     Assessment & Plan:   No problem-specific Assessment & Plan notes found for this encounter.     Glenford Bayley, NP 09/30/2023

## 2023-10-20 DIAGNOSIS — Z6830 Body mass index (BMI) 30.0-30.9, adult: Secondary | ICD-10-CM | POA: Diagnosis not present

## 2023-10-20 DIAGNOSIS — I5032 Chronic diastolic (congestive) heart failure: Secondary | ICD-10-CM | POA: Diagnosis not present

## 2023-10-20 DIAGNOSIS — M48061 Spinal stenosis, lumbar region without neurogenic claudication: Secondary | ICD-10-CM | POA: Diagnosis not present

## 2023-10-20 DIAGNOSIS — I209 Angina pectoris, unspecified: Secondary | ICD-10-CM | POA: Diagnosis not present

## 2023-10-20 DIAGNOSIS — G894 Chronic pain syndrome: Secondary | ICD-10-CM | POA: Diagnosis not present

## 2023-10-20 DIAGNOSIS — J449 Chronic obstructive pulmonary disease, unspecified: Secondary | ICD-10-CM | POA: Diagnosis not present

## 2023-10-20 DIAGNOSIS — E6609 Other obesity due to excess calories: Secondary | ICD-10-CM | POA: Diagnosis not present

## 2023-10-27 DIAGNOSIS — I110 Hypertensive heart disease with heart failure: Secondary | ICD-10-CM | POA: Diagnosis not present

## 2023-10-27 DIAGNOSIS — I209 Angina pectoris, unspecified: Secondary | ICD-10-CM | POA: Diagnosis not present

## 2023-10-27 DIAGNOSIS — F0280 Dementia in other diseases classified elsewhere without behavioral disturbance: Secondary | ICD-10-CM | POA: Diagnosis not present

## 2023-10-27 DIAGNOSIS — J449 Chronic obstructive pulmonary disease, unspecified: Secondary | ICD-10-CM | POA: Diagnosis not present

## 2023-10-27 DIAGNOSIS — G894 Chronic pain syndrome: Secondary | ICD-10-CM | POA: Diagnosis not present

## 2023-11-11 DIAGNOSIS — I5032 Chronic diastolic (congestive) heart failure: Secondary | ICD-10-CM | POA: Diagnosis not present

## 2023-11-11 DIAGNOSIS — J449 Chronic obstructive pulmonary disease, unspecified: Secondary | ICD-10-CM | POA: Diagnosis not present

## 2023-11-11 DIAGNOSIS — E6609 Other obesity due to excess calories: Secondary | ICD-10-CM | POA: Diagnosis not present

## 2023-11-11 DIAGNOSIS — Z6830 Body mass index (BMI) 30.0-30.9, adult: Secondary | ICD-10-CM | POA: Diagnosis not present

## 2023-11-11 DIAGNOSIS — M48061 Spinal stenosis, lumbar region without neurogenic claudication: Secondary | ICD-10-CM | POA: Diagnosis not present

## 2023-11-11 DIAGNOSIS — I5022 Chronic systolic (congestive) heart failure: Secondary | ICD-10-CM | POA: Diagnosis not present

## 2023-11-11 DIAGNOSIS — M4302 Spondylolysis, cervical region: Secondary | ICD-10-CM | POA: Diagnosis not present

## 2023-11-11 DIAGNOSIS — G894 Chronic pain syndrome: Secondary | ICD-10-CM | POA: Diagnosis not present

## 2023-11-26 DIAGNOSIS — G894 Chronic pain syndrome: Secondary | ICD-10-CM | POA: Diagnosis not present

## 2023-11-26 DIAGNOSIS — K5903 Drug induced constipation: Secondary | ICD-10-CM | POA: Diagnosis not present

## 2023-11-26 DIAGNOSIS — J449 Chronic obstructive pulmonary disease, unspecified: Secondary | ICD-10-CM | POA: Diagnosis not present

## 2023-11-26 DIAGNOSIS — F1120 Opioid dependence, uncomplicated: Secondary | ICD-10-CM | POA: Diagnosis not present

## 2023-12-22 DIAGNOSIS — I5032 Chronic diastolic (congestive) heart failure: Secondary | ICD-10-CM | POA: Diagnosis not present

## 2023-12-22 DIAGNOSIS — J449 Chronic obstructive pulmonary disease, unspecified: Secondary | ICD-10-CM | POA: Diagnosis not present

## 2023-12-22 DIAGNOSIS — G894 Chronic pain syndrome: Secondary | ICD-10-CM | POA: Diagnosis not present

## 2023-12-22 DIAGNOSIS — Z6830 Body mass index (BMI) 30.0-30.9, adult: Secondary | ICD-10-CM | POA: Diagnosis not present

## 2023-12-22 DIAGNOSIS — F1120 Opioid dependence, uncomplicated: Secondary | ICD-10-CM | POA: Diagnosis not present

## 2023-12-22 DIAGNOSIS — E6609 Other obesity due to excess calories: Secondary | ICD-10-CM | POA: Diagnosis not present

## 2023-12-22 DIAGNOSIS — M4302 Spondylolysis, cervical region: Secondary | ICD-10-CM | POA: Diagnosis not present

## 2023-12-22 DIAGNOSIS — I209 Angina pectoris, unspecified: Secondary | ICD-10-CM | POA: Diagnosis not present

## 2023-12-22 DIAGNOSIS — M48061 Spinal stenosis, lumbar region without neurogenic claudication: Secondary | ICD-10-CM | POA: Diagnosis not present

## 2024-01-03 DIAGNOSIS — I209 Angina pectoris, unspecified: Secondary | ICD-10-CM | POA: Diagnosis not present

## 2024-01-03 DIAGNOSIS — M48061 Spinal stenosis, lumbar region without neurogenic claudication: Secondary | ICD-10-CM | POA: Diagnosis not present

## 2024-01-03 DIAGNOSIS — G894 Chronic pain syndrome: Secondary | ICD-10-CM | POA: Diagnosis not present

## 2024-01-03 DIAGNOSIS — F1120 Opioid dependence, uncomplicated: Secondary | ICD-10-CM | POA: Diagnosis not present

## 2024-01-03 DIAGNOSIS — I5032 Chronic diastolic (congestive) heart failure: Secondary | ICD-10-CM | POA: Diagnosis not present

## 2024-01-03 DIAGNOSIS — M4302 Spondylolysis, cervical region: Secondary | ICD-10-CM | POA: Diagnosis not present

## 2024-01-03 DIAGNOSIS — I881 Chronic lymphadenitis, except mesenteric: Secondary | ICD-10-CM | POA: Diagnosis not present

## 2024-01-03 DIAGNOSIS — K5903 Drug induced constipation: Secondary | ICD-10-CM | POA: Diagnosis not present

## 2024-01-03 DIAGNOSIS — I700 Atherosclerosis of aorta: Secondary | ICD-10-CM | POA: Diagnosis not present

## 2024-01-06 DIAGNOSIS — M4302 Spondylolysis, cervical region: Secondary | ICD-10-CM | POA: Diagnosis not present

## 2024-01-06 DIAGNOSIS — F1120 Opioid dependence, uncomplicated: Secondary | ICD-10-CM | POA: Diagnosis not present

## 2024-01-06 DIAGNOSIS — F0280 Dementia in other diseases classified elsewhere without behavioral disturbance: Secondary | ICD-10-CM | POA: Diagnosis not present

## 2024-01-06 DIAGNOSIS — M48061 Spinal stenosis, lumbar region without neurogenic claudication: Secondary | ICD-10-CM | POA: Diagnosis not present

## 2024-01-06 DIAGNOSIS — K5903 Drug induced constipation: Secondary | ICD-10-CM | POA: Diagnosis not present

## 2024-01-06 DIAGNOSIS — I700 Atherosclerosis of aorta: Secondary | ICD-10-CM | POA: Diagnosis not present

## 2024-01-06 DIAGNOSIS — G894 Chronic pain syndrome: Secondary | ICD-10-CM | POA: Diagnosis not present

## 2024-01-06 DIAGNOSIS — I209 Angina pectoris, unspecified: Secondary | ICD-10-CM | POA: Diagnosis not present

## 2024-01-06 DIAGNOSIS — I5032 Chronic diastolic (congestive) heart failure: Secondary | ICD-10-CM | POA: Diagnosis not present

## 2024-01-16 ENCOUNTER — Encounter: Payer: Self-pay | Admitting: Primary Care

## 2024-01-16 ENCOUNTER — Ambulatory Visit: Payer: Medicare HMO | Admitting: Primary Care

## 2024-01-16 VITALS — BP 127/55 | HR 66 | Ht 70.0 in | Wt 218.4 lb

## 2024-01-16 DIAGNOSIS — J4489 Other specified chronic obstructive pulmonary disease: Secondary | ICD-10-CM | POA: Diagnosis not present

## 2024-01-16 DIAGNOSIS — J9611 Chronic respiratory failure with hypoxia: Secondary | ICD-10-CM

## 2024-01-16 DIAGNOSIS — C3432 Malignant neoplasm of lower lobe, left bronchus or lung: Secondary | ICD-10-CM | POA: Diagnosis not present

## 2024-01-16 MED ORDER — GUAIFENESIN ER 600 MG PO TB12: 600.0000 mg | ORAL_TABLET | Freq: Two times a day (BID) | ORAL | 0 refills | Status: DC | PRN

## 2024-01-16 NOTE — Patient Instructions (Addendum)
You have presumed stage 1 non-small lung cancer. CT chest in September 2024 showed decrease size left pulmonary nodule from 1.9cm to 1.3cm- which is favorable. Dr. Roselind Messier plan to repeat imaging in 6 months and FU with him in April  Dr. Billie Lade, MD [address] [place], [place], [place] [address]  3.7 mi [phone]  Recommendations: - Continue Breztri morning and evening - Take mucinex 1 tablet morning and evening - Continue oxygen with activity and bedtime   Orders: Ambulatory walk test on RA- if <88% please titrate O2  Follow-up: 3 months with Dr. Sherene Sires (30 min- new patient)

## 2024-01-16 NOTE — Progress Notes (Addendum)
@Patient  ID: Dustin [name], male    DOB: 1934-03-15, 89 y.o.   MRN: [id number]  No chief complaint on file.   Referring provider: [name], MD  HPI: 89 year old male, former smoker quit 1996 (80-pack-year history).  Past medical history significant for hypertension, unstable angina, sinus bradycardia, diastolic heart failure, COPD, community-acquired pneumonia, acute respiratory failure with hypoxia, OSA, lung cancer, hypothyroidism, Alzheimer's dementia, hyperlipidemia.  Former patient of Dr. Craige Cotta.   01/16/2024 Discussed the use of AI scribe software for clinical note transcription with the patient, who gave verbal consent to proceed.  Patient presents today for 15-month follow-up.  Patient is followed by our office for COPD, respiratory failure and history of left lower lobe lung cancer.  Patient status post SBRT with Dr. Roselind Messier.  Follow-up imaging with radiation oncology.  He is maintained on Breztri 2 puffs twice daily as needed albuterol, as needed Mucinex and flutter valve.  PFTs deferred at previous visit as they will likely not change management of his respiratory condition.  He is oxygen dependent using 2 L of oxygen with exertion and sleep.  The patient completed radiation spring 2024, receiving three treatments instead of the initially planned six. The patient expresses concern about the frequency of follow-up visits with the oncologist and the perceived rapid growth of the cancer.  CT chest in September 2024 showed interval decrease in size of left lower lobe pulmonary nodule. Reviewed Dr. Gillian Shields note, imaging results are favorable. Exam without evidence of reoccurrence. Plan to follow-up in 6 months. Reassurance regarding plan provided to patient at length.   The patient reports a significant cough, particularly in the mornings, producing large amounts of mucus. No hemoptysis. The patient manages this by coughing up the mucus, which he feels is necessary for maintaining his  ability to breathe. The patient also reports an episode of syncope, which resulted in a brief hospital admission. The episode was attributed to low oxygen levels.  The patient uses Breztri inhaler daily and oxygen therapy, primarily at night. The patient monitors his oxygen levels during the day and uses supplemental oxygen when levels drop below 90. The patient also reports that his breathing is not optimal, experiencing shortness of breath with exertion.  No Known Allergies  Immunization History  Administered Date(s) Administered   Fluad Quad(high Dose 65+) 10/31/2021   Influenza-Unspecified 08/27/2014   Tdap 10/01/2021    Past Medical History:  Diagnosis Date   Alzheimer disease (HCC)    Anxiety    Arthritis    Atrophic gastritis    a. By EGD 02/2013.   Carotid artery disease (HCC)    a. mild-mod plaque, <50% stenosis bilat by duplex 2018.   Coronary atherosclerosis of native coronary artery    a. Multivessel s/p CABG 1996. b. DESx2 to SVG-PDA in 2012 c. DES to distal LM in 11/2019 d. cath in 02/2021 showing patent LM stent, patent LIMA-LAD, patent SVG-OM1 with chronically occluded jump limb to OM2 and chronic occlusion of SVG-D2 and SVG-PDA and no targets for intervention   DDD (degenerative disc disease)    Chronic back pain   Dementia (HCC)    Enlarged prostate    Essential hypertension    Hematuria    History of radiation therapy    Left Lung- 04/26/23-Dr. Antony Blackbird   Hypothyroidism    LBBB (left bundle branch block)    MI (myocardial infarction) (HCC)    Mixed hyperlipidemia    OA (osteoarthritis)    OSA (obstructive sleep apnea)  Pneumonia due to COVID-19 virus    February 2021   Sinus bradycardia    a. Aricept and BB discontinued due to this.    Tobacco History: Social History   Tobacco Use  Smoking Status Former   Current packs/day: 0.00   Average packs/day: 2.0 packs/day for 40.0 years (80.0 ttl pk-yrs)   Types: Cigarettes   Start date: 12/27/1954    Quit date: 12/27/1994   Years since quitting: 29.0  Smokeless Tobacco Never   Counseling given: Not Answered   Outpatient Medications Prior to Visit  Medication Sig Dispense Refill   albuterol (PROVENTIL) (2.5 MG/3ML) 0.083% nebulizer solution Take 3 mLs (2.5 mg total) by nebulization every 2 (two) hours as needed for wheezing or shortness of breath. 75 mL 3   albuterol (VENTOLIN HFA) 108 (90 Base) MCG/ACT inhaler Inhale 2 puffs into the lungs every 6 (six) hours as needed for wheezing or shortness of breath. 18 g 3   alfuzosin (UROXATRAL) 10 MG 24 hr tablet TAKE 1 TABLET DAILY WITH BREAKFAST. 30 tablet 0   aspirin EC 81 MG tablet Take 1 tablet (81 mg total) by mouth daily. Swallow whole. 90 tablet 3   Budeson-Glycopyrrol-Formoterol (BREZTRI AEROSPHERE) 160-9-4.8 MCG/ACT AERO Inhale 2 puffs into the lungs in the morning and at bedtime. 10.7 g 5   clopidogrel (PLAVIX) 75 MG tablet Take 1 tablet (75 mg total) by mouth daily. 30 tablet 11   docusate sodium (COLACE) 100 MG capsule Take by mouth as directed.     DULoxetine (CYMBALTA) 30 MG capsule Take 30 mg by mouth daily.     dutasteride (AVODART) 0.5 MG capsule TAKE ONE CAPSULE BY MOUTH ONCE DAILY IN THE AFTERNOON. 90 capsule 0   escitalopram (LEXAPRO) 20 MG tablet Take 20 mg by mouth every morning.      furosemide (LASIX) 20 MG tablet Take 1 tablet (20 mg total) by mouth 2 (two) times daily. For fluid 60 tablet 5   gabapentin (NEURONTIN) 100 MG capsule Take 100 mg by mouth 3 (three) times daily.     guaiFENesin (MUCINEX) 600 MG 12 hr tablet Take 1 tablet (600 mg total) by mouth 2 (two) times daily. 20 tablet 0   isosorbide mononitrate (IMDUR) 120 MG 24 hr tablet Take 1 tablet (120 mg total) by mouth every morning. 30 tablet 11   levothyroxine (SYNTHROID) 75 MCG tablet Take 75 mcg by mouth daily.     memantine (NAMENDA) 10 MG tablet Take 10 mg by mouth 2 (two) times daily.      nitroGLYCERIN (NITROSTAT) 0.4 MG SL tablet Place 1 tablet (0.4 mg  total) under the tongue every 5 (five) minutes as needed for chest pain. 15 tablet 5   oxycodone (ROXICODONE) 30 MG immediate release tablet Take 30 mg by mouth every 6 (six) hours as needed for pain.     pantoprazole (PROTONIX) 40 MG tablet Take 1 tablet (40 mg total) by mouth 2 (two) times daily for 14 days, THEN 1 tablet (40 mg total) daily. 388 tablet 0   potassium chloride SA (KLOR-CON M) 20 MEQ tablet Take 1 tablet (20 mEq total) by mouth daily. 30 tablet 3   promethazine (PHENERGAN) 25 MG tablet Take 25 mg by mouth 4 (four) times daily as needed.     QUEtiapine (SEROQUEL) 25 MG tablet Take 25 mg by mouth at bedtime as needed (sleep).     ranolazine (RANEXA) 1000 MG SR tablet Take 1 tablet (1,000 mg total) by mouth 2 (two) times  daily. 60 tablet 5   ranolazine (RANEXA) 500 MG 12 hr tablet Take 500 mg by mouth 2 (two) times daily.     risperiDONE (RISPERDAL) 0.25 MG tablet Take 0.25 mg by mouth at bedtime.     rosuvastatin (CRESTOR) 40 MG tablet Take 1 tablet (40 mg total) by mouth daily at 6 PM. 30 tablet 5   No facility-administered medications prior to visit.      Review of Systems  Review of Systems  Constitutional: Negative.   HENT: Negative.    Respiratory:  Positive for cough and shortness of breath.   Cardiovascular: Negative.      Physical Exam  There were no vitals taken for this visit. Physical Exam Constitutional:      Appearance: Normal appearance. He is obese. He is not ill-appearing.  HENT:     Head: Normocephalic and atraumatic.     Mouth/Throat:     Mouth: Mucous membranes are moist.     Pharynx: Oropharynx is clear.  Cardiovascular:     Rate and Rhythm: Normal rate and regular rhythm.  Pulmonary:     Effort: Pulmonary effort is normal.     Breath sounds: Normal breath sounds.     Comments: CTA Musculoskeletal:     Cervical back: Normal range of motion and neck supple.  Neurological:     General: No focal deficit present.     Mental Status: He is  alert and oriented to person, place, and time. Mental status is at baseline.  Psychiatric:        Mood and Affect: Mood normal.        Behavior: Behavior normal.        Thought Content: Thought content normal.        Judgment: Judgment normal.      Lab Results:  CBC    Component Value Date/Time   WBC 8.7 08/01/2023 1935   RBC 3.50 (L) 08/01/2023 1935   HGB 11.4 (L) 08/01/2023 1935   HGB 12.7 (L) 02/25/2021 1258   HCT 34.4 (L) 08/01/2023 1935   HCT 38.7 02/25/2021 1258   HCT 43 01/26/2013 0000   PLT 178 08/01/2023 1935   PLT 206 02/25/2021 1258   MCV 98.3 08/01/2023 1935   MCV 93 02/25/2021 1258   MCH 32.6 08/01/2023 1935   MCHC 33.1 08/01/2023 1935   RDW 13.8 08/01/2023 1935   RDW 12.8 02/25/2021 1258   LYMPHSABS 0.5 (L) 07/30/2023 0358   LYMPHSABS 2.3 02/25/2021 1258   MONOABS 0.4 07/30/2023 0358   EOSABS 0.0 07/30/2023 0358   EOSABS 0.1 02/25/2021 1258   BASOSABS 0.0 07/30/2023 0358   BASOSABS 0.0 02/25/2021 1258    BMET    Component Value Date/Time   NA 136 08/01/2023 1935   NA 140 02/25/2021 1258   K 3.6 08/01/2023 1935   K 3.9 01/26/2013 0000   CL 99 08/01/2023 1935   CL 100 01/26/2013 0000   CO2 30 08/01/2023 1935   CO2 31 01/26/2013 0000   GLUCOSE 120 (H) 08/01/2023 1935   BUN 21 08/01/2023 1935   BUN 21 02/25/2021 1258   CREATININE 1.30 (H) 08/01/2023 1935   CREATININE 0.80 01/26/2013 0000   CALCIUM 8.4 (L) 08/01/2023 1935   GFRNONAA 54 (L) 08/01/2023 1935   GFRAA >60 09/21/2020 1932    BNP    Component Value Date/Time   BNP 126.0 (H) 07/29/2023 1927    ProBNP    Component Value Date/Time   PROBNP 1,378.0 (H) 08/30/2014 1200  Imaging: No results found.   Assessment & Plan:   1. COPD with chronic bronchitis (HCC) (Primary)  2. Chronic respiratory failure with hypoxia (HCC)  3. Primary malignant neoplasm of left lower lobe of lung (HCC)    Presumed Stage 1 Non-Small Cell Lung Cancer Completed radiation therapy in May 2024  with subsequent decrease in size of the nodule from 19mm to 13mm. Patient expresses concern about the frequency of follow-up and the potential for rapid growth of the cancer. -Encouraged patient to discuss concerns with oncologist, Dr. Roselind Messier, at next appointment on March 29, 2024. -CT scan to be performed prior to next oncology appointment.  COPD with Respiratory Failure Patient uses Breztri inhaler daily and oxygen at night or as needed during the day. Reports increased mucus production, particularly in the morning, and occasional oxygen desaturation with exertion. -Continue Breztri Aerosphere two puffs morning and evening  -Continue oxygen use as currently prescribed. -Add Mucinex to help with mucus production; prescription sent to [facility] on [address]. -Consider use of a wedge pillow or sleeping in a reclined position to aid in lung drainage.  Syncope -Patient experienced a syncopal episode in August requiring ED visit which was determined to be due to low oxygen levels. Admitted to the hospital for a short period of time.  Chronic respiratory failure with hypoxia -Patient walked today in office on [place] and O2 level desaturated to 87% after walking about 50 feet. Encouraged patient to keep portable oxygen concentrator with him, he needs to use 2L oxygen with any activity and at bedtime   Follow-up Next appointment in three months with Dr. Sherene Sires.     Glenford Bayley, NP 01/16/2024

## 2024-01-24 ENCOUNTER — Telehealth: Payer: Self-pay

## 2024-02-16 DIAGNOSIS — F0280 Dementia in other diseases classified elsewhere without behavioral disturbance: Secondary | ICD-10-CM | POA: Diagnosis not present

## 2024-02-16 DIAGNOSIS — G894 Chronic pain syndrome: Secondary | ICD-10-CM | POA: Diagnosis not present

## 2024-02-16 DIAGNOSIS — M4302 Spondylolysis, cervical region: Secondary | ICD-10-CM | POA: Diagnosis not present

## 2024-02-16 DIAGNOSIS — M48061 Spinal stenosis, lumbar region without neurogenic claudication: Secondary | ICD-10-CM | POA: Diagnosis not present

## 2024-02-16 DIAGNOSIS — G309 Alzheimer's disease, unspecified: Secondary | ICD-10-CM | POA: Diagnosis not present

## 2024-02-16 DIAGNOSIS — J449 Chronic obstructive pulmonary disease, unspecified: Secondary | ICD-10-CM | POA: Diagnosis not present

## 2024-02-16 DIAGNOSIS — I209 Angina pectoris, unspecified: Secondary | ICD-10-CM | POA: Diagnosis not present

## 2024-02-16 DIAGNOSIS — F1120 Opioid dependence, uncomplicated: Secondary | ICD-10-CM | POA: Diagnosis not present

## 2024-02-20 ENCOUNTER — Encounter (HOSPITAL_COMMUNITY): Payer: Self-pay | Admitting: Emergency Medicine

## 2024-02-20 ENCOUNTER — Other Ambulatory Visit: Payer: Self-pay

## 2024-02-20 ENCOUNTER — Inpatient Hospital Stay (HOSPITAL_COMMUNITY)
Admission: EM | Admit: 2024-02-20 | Discharge: 2024-02-24 | DRG: 193 | Disposition: A | Discharge status: Home Health | Payer: Medicare HMO | Attending: Internal Medicine | Admitting: Internal Medicine

## 2024-02-20 ENCOUNTER — Emergency Department (HOSPITAL_COMMUNITY): Payer: Medicare HMO

## 2024-02-20 DIAGNOSIS — J9601 Acute respiratory failure with hypoxia: Secondary | ICD-10-CM | POA: Diagnosis present

## 2024-02-20 DIAGNOSIS — Z79890 Hormone replacement therapy (postmenopausal): Secondary | ICD-10-CM | POA: Diagnosis not present

## 2024-02-20 DIAGNOSIS — G309 Alzheimer's disease, unspecified: Secondary | ICD-10-CM | POA: Diagnosis not present

## 2024-02-20 DIAGNOSIS — Z1152 Encounter for screening for COVID-19: Secondary | ICD-10-CM | POA: Diagnosis not present

## 2024-02-20 DIAGNOSIS — G4733 Obstructive sleep apnea (adult) (pediatric): Secondary | ICD-10-CM | POA: Diagnosis present

## 2024-02-20 DIAGNOSIS — E039 Hypothyroidism, unspecified: Secondary | ICD-10-CM | POA: Diagnosis present

## 2024-02-20 DIAGNOSIS — R079 Chest pain, unspecified: Secondary | ICD-10-CM | POA: Diagnosis not present

## 2024-02-20 DIAGNOSIS — Z7982 Long term (current) use of aspirin: Secondary | ICD-10-CM | POA: Diagnosis not present

## 2024-02-20 DIAGNOSIS — F0283 Dementia in other diseases classified elsewhere, unspecified severity, with mood disturbance: Secondary | ICD-10-CM | POA: Diagnosis present

## 2024-02-20 DIAGNOSIS — E782 Mixed hyperlipidemia: Secondary | ICD-10-CM | POA: Diagnosis present

## 2024-02-20 DIAGNOSIS — Z6832 Body mass index (BMI) 32.0-32.9, adult: Secondary | ICD-10-CM | POA: Diagnosis not present

## 2024-02-20 DIAGNOSIS — F0280 Dementia in other diseases classified elsewhere without behavioral disturbance: Secondary | ICD-10-CM | POA: Diagnosis not present

## 2024-02-20 DIAGNOSIS — I10 Essential (primary) hypertension: Secondary | ICD-10-CM | POA: Diagnosis present

## 2024-02-20 DIAGNOSIS — E66811 Obesity, class 1: Secondary | ICD-10-CM

## 2024-02-20 DIAGNOSIS — Z7902 Long term (current) use of antithrombotics/antiplatelets: Secondary | ICD-10-CM

## 2024-02-20 DIAGNOSIS — K219 Gastro-esophageal reflux disease without esophagitis: Secondary | ICD-10-CM | POA: Diagnosis present

## 2024-02-20 DIAGNOSIS — K439 Ventral hernia without obstruction or gangrene: Secondary | ICD-10-CM | POA: Diagnosis present

## 2024-02-20 DIAGNOSIS — Z8616 Personal history of COVID-19: Secondary | ICD-10-CM

## 2024-02-20 DIAGNOSIS — Z9981 Dependence on supplemental oxygen: Secondary | ICD-10-CM

## 2024-02-20 DIAGNOSIS — J101 Influenza due to other identified influenza virus with other respiratory manifestations: Principal | ICD-10-CM | POA: Diagnosis present

## 2024-02-20 DIAGNOSIS — I252 Old myocardial infarction: Secondary | ICD-10-CM

## 2024-02-20 DIAGNOSIS — J111 Influenza due to unidentified influenza virus with other respiratory manifestations: Secondary | ICD-10-CM

## 2024-02-20 DIAGNOSIS — Z8249 Family history of ischemic heart disease and other diseases of the circulatory system: Secondary | ICD-10-CM

## 2024-02-20 DIAGNOSIS — Z87891 Personal history of nicotine dependence: Secondary | ICD-10-CM

## 2024-02-20 DIAGNOSIS — F32A Depression, unspecified: Secondary | ICD-10-CM | POA: Diagnosis present

## 2024-02-20 DIAGNOSIS — J9621 Acute and chronic respiratory failure with hypoxia: Secondary | ICD-10-CM | POA: Diagnosis not present

## 2024-02-20 DIAGNOSIS — J09X1 Influenza due to identified novel influenza A virus with pneumonia: Secondary | ICD-10-CM | POA: Diagnosis present

## 2024-02-20 DIAGNOSIS — N401 Enlarged prostate with lower urinary tract symptoms: Secondary | ICD-10-CM | POA: Diagnosis not present

## 2024-02-20 DIAGNOSIS — N138 Other obstructive and reflux uropathy: Secondary | ICD-10-CM | POA: Diagnosis not present

## 2024-02-20 DIAGNOSIS — R4182 Altered mental status, unspecified: Secondary | ICD-10-CM | POA: Diagnosis not present

## 2024-02-20 DIAGNOSIS — I25119 Atherosclerotic heart disease of native coronary artery with unspecified angina pectoris: Secondary | ICD-10-CM | POA: Diagnosis present

## 2024-02-20 DIAGNOSIS — I700 Atherosclerosis of aorta: Secondary | ICD-10-CM | POA: Diagnosis not present

## 2024-02-20 DIAGNOSIS — I25810 Atherosclerosis of coronary artery bypass graft(s) without angina pectoris: Secondary | ICD-10-CM | POA: Diagnosis present

## 2024-02-20 DIAGNOSIS — J441 Chronic obstructive pulmonary disease with (acute) exacerbation: Secondary | ICD-10-CM | POA: Diagnosis present

## 2024-02-20 DIAGNOSIS — Z8701 Personal history of pneumonia (recurrent): Secondary | ICD-10-CM

## 2024-02-20 DIAGNOSIS — Z955 Presence of coronary angioplasty implant and graft: Secondary | ICD-10-CM

## 2024-02-20 DIAGNOSIS — Z923 Personal history of irradiation: Secondary | ICD-10-CM

## 2024-02-20 DIAGNOSIS — I2510 Atherosclerotic heart disease of native coronary artery without angina pectoris: Secondary | ICD-10-CM | POA: Diagnosis not present

## 2024-02-20 DIAGNOSIS — I25118 Atherosclerotic heart disease of native coronary artery with other forms of angina pectoris: Secondary | ICD-10-CM

## 2024-02-20 LAB — CBC
HCT: 36.2 % — ABNORMAL LOW (ref 39.0–52.0)
Hemoglobin: 11.7 g/dL — ABNORMAL LOW (ref 13.0–17.0)
MCH: 31 pg (ref 26.0–34.0)
MCHC: 32.3 g/dL (ref 30.0–36.0)
MCV: 96 fL (ref 80.0–100.0)
Platelets: 167 10*3/uL (ref 150–400)
RBC: 3.77 MIL/uL — ABNORMAL LOW (ref 4.22–5.81)
RDW: 14.1 % (ref 11.5–15.5)
WBC: 6.1 10*3/uL (ref 4.0–10.5)
nRBC: 0 % (ref 0.0–0.2)

## 2024-02-20 LAB — BASIC METABOLIC PANEL
Anion gap: 11 (ref 5–15)
BUN: 15 mg/dL (ref 8–23)
CO2: 28 mmol/L (ref 22–32)
Calcium: 8.9 mg/dL (ref 8.9–10.3)
Chloride: 93 mmol/L — ABNORMAL LOW (ref 98–111)
Creatinine, Ser: 1.13 mg/dL (ref 0.61–1.24)
GFR, Estimated: >60 mL/min (ref >60)
Glucose, Bld: 142 mg/dL — ABNORMAL HIGH (ref 70–99)
Potassium: 3.9 mmol/L (ref 3.5–5.1)
Sodium: 132 mmol/L — ABNORMAL LOW (ref 135–145)

## 2024-02-20 LAB — TROPONIN I (HIGH SENSITIVITY): Troponin I (High Sensitivity): 16 ng/L (ref <18)

## 2024-02-20 NOTE — ED Notes (Signed)
 Unable to obtain EKG due to patient agitation.

## 2024-02-20 NOTE — ED Triage Notes (Signed)
 Pt with cough, chest pain, and increased AMS x 5 days.  CHF hx.  Has been using O2 only at night.  Hypoxic at time of triage.  O2 placed at 2L.  Pt agitated and trying to walk around room.  Unsteady.  Normally uses walker and cane at home.

## 2024-02-21 ENCOUNTER — Other Ambulatory Visit: Payer: Self-pay

## 2024-02-21 DIAGNOSIS — J441 Chronic obstructive pulmonary disease with (acute) exacerbation: Secondary | ICD-10-CM | POA: Diagnosis present

## 2024-02-21 DIAGNOSIS — Z79890 Hormone replacement therapy (postmenopausal): Secondary | ICD-10-CM | POA: Diagnosis not present

## 2024-02-21 DIAGNOSIS — K219 Gastro-esophageal reflux disease without esophagitis: Secondary | ICD-10-CM | POA: Diagnosis present

## 2024-02-21 DIAGNOSIS — J101 Influenza due to other identified influenza virus with other respiratory manifestations: Secondary | ICD-10-CM | POA: Diagnosis present

## 2024-02-21 DIAGNOSIS — N138 Other obstructive and reflux uropathy: Secondary | ICD-10-CM | POA: Diagnosis present

## 2024-02-21 DIAGNOSIS — J09X1 Influenza due to identified novel influenza A virus with pneumonia: Secondary | ICD-10-CM | POA: Diagnosis present

## 2024-02-21 DIAGNOSIS — I10 Essential (primary) hypertension: Secondary | ICD-10-CM | POA: Diagnosis present

## 2024-02-21 DIAGNOSIS — Z8616 Personal history of COVID-19: Secondary | ICD-10-CM | POA: Diagnosis not present

## 2024-02-21 DIAGNOSIS — F0280 Dementia in other diseases classified elsewhere without behavioral disturbance: Secondary | ICD-10-CM | POA: Diagnosis not present

## 2024-02-21 DIAGNOSIS — Z1152 Encounter for screening for COVID-19: Secondary | ICD-10-CM | POA: Diagnosis not present

## 2024-02-21 DIAGNOSIS — Z9981 Dependence on supplemental oxygen: Secondary | ICD-10-CM | POA: Diagnosis not present

## 2024-02-21 DIAGNOSIS — F32A Depression, unspecified: Secondary | ICD-10-CM | POA: Diagnosis present

## 2024-02-21 DIAGNOSIS — Z7982 Long term (current) use of aspirin: Secondary | ICD-10-CM | POA: Diagnosis not present

## 2024-02-21 DIAGNOSIS — E782 Mixed hyperlipidemia: Secondary | ICD-10-CM | POA: Diagnosis present

## 2024-02-21 DIAGNOSIS — N401 Enlarged prostate with lower urinary tract symptoms: Secondary | ICD-10-CM | POA: Diagnosis present

## 2024-02-21 DIAGNOSIS — J9621 Acute and chronic respiratory failure with hypoxia: Secondary | ICD-10-CM | POA: Diagnosis present

## 2024-02-21 DIAGNOSIS — E039 Hypothyroidism, unspecified: Secondary | ICD-10-CM | POA: Diagnosis present

## 2024-02-21 DIAGNOSIS — G4733 Obstructive sleep apnea (adult) (pediatric): Secondary | ICD-10-CM | POA: Diagnosis present

## 2024-02-21 DIAGNOSIS — I252 Old myocardial infarction: Secondary | ICD-10-CM | POA: Diagnosis not present

## 2024-02-21 DIAGNOSIS — Z8249 Family history of ischemic heart disease and other diseases of the circulatory system: Secondary | ICD-10-CM | POA: Diagnosis not present

## 2024-02-21 DIAGNOSIS — E66811 Obesity, class 1: Secondary | ICD-10-CM | POA: Diagnosis present

## 2024-02-21 DIAGNOSIS — G309 Alzheimer's disease, unspecified: Secondary | ICD-10-CM | POA: Diagnosis present

## 2024-02-21 DIAGNOSIS — I25810 Atherosclerosis of coronary artery bypass graft(s) without angina pectoris: Secondary | ICD-10-CM | POA: Diagnosis present

## 2024-02-21 DIAGNOSIS — Z7902 Long term (current) use of antithrombotics/antiplatelets: Secondary | ICD-10-CM | POA: Diagnosis not present

## 2024-02-21 DIAGNOSIS — I2510 Atherosclerotic heart disease of native coronary artery without angina pectoris: Secondary | ICD-10-CM | POA: Diagnosis not present

## 2024-02-21 DIAGNOSIS — F0283 Dementia in other diseases classified elsewhere, unspecified severity, with mood disturbance: Secondary | ICD-10-CM | POA: Diagnosis present

## 2024-02-21 DIAGNOSIS — Z6832 Body mass index (BMI) 32.0-32.9, adult: Secondary | ICD-10-CM | POA: Diagnosis not present

## 2024-02-21 LAB — RESP PANEL BY RT-PCR (RSV, FLU A&B, COVID)  RVPGX2
Influenza A by PCR: POSITIVE — AB
Influenza B by PCR: NEGATIVE
Resp Syncytial Virus by PCR: NEGATIVE
SARS Coronavirus 2 by RT PCR: NEGATIVE

## 2024-02-21 LAB — TROPONIN I (HIGH SENSITIVITY): Troponin I (High Sensitivity): 15 ng/L (ref <18)

## 2024-02-21 LAB — D-DIMER, QUANTITATIVE: D-Dimer, Quant: 0.59 ug{FEU}/mL — ABNORMAL HIGH (ref 0.00–0.50)

## 2024-02-21 MED ORDER — ONDANSETRON HCL 4 MG PO TABS: 4.0000 mg | ORAL_TABLET | Freq: Four times a day (QID) | ORAL | Status: DC | PRN

## 2024-02-21 MED ORDER — SODIUM CHLORIDE 0.9% FLUSH
3.0000 mL | Freq: Two times a day (BID) | INTRAVENOUS | Status: DC
  Administered 2024-02-21 – 2024-02-24 (×7): 3 mL via INTRAVENOUS

## 2024-02-21 MED ORDER — OSELTAMIVIR PHOSPHATE 75 MG PO CAPS
75.0000 mg | ORAL_CAPSULE | Freq: Two times a day (BID) | ORAL | Status: DC
  Administered 2024-02-21 – 2024-02-22 (×3): 75 mg via ORAL
  Filled 2024-02-21 (×3): qty 1

## 2024-02-21 MED ORDER — BUDESON-GLYCOPYRROL-FORMOTEROL 160-9-4.8 MCG/ACT IN AERO: 2.0000 | INHALATION_SPRAY | Freq: Two times a day (BID) | RESPIRATORY_TRACT | Status: DC

## 2024-02-21 MED ORDER — GUAIFENESIN ER 600 MG PO TB12: 600.0000 mg | ORAL_TABLET | Freq: Two times a day (BID) | ORAL | Status: DC | PRN

## 2024-02-21 MED ORDER — ACETAMINOPHEN 325 MG PO TABS
650.0000 mg | ORAL_TABLET | Freq: Four times a day (QID) | ORAL | Status: DC | PRN
  Administered 2024-02-21: 650 mg via ORAL
  Filled 2024-02-21: qty 2

## 2024-02-21 MED ORDER — CLOPIDOGREL BISULFATE 75 MG PO TABS
75.0000 mg | ORAL_TABLET | Freq: Every day | ORAL | Status: DC
Start: 2024-02-21 — End: 2024-02-24
  Administered 2024-02-21 – 2024-02-24 (×4): 75 mg via ORAL
  Filled 2024-02-21 (×4): qty 1

## 2024-02-21 MED ORDER — SODIUM CHLORIDE 0.9% FLUSH
3.0000 mL | Freq: Two times a day (BID) | INTRAVENOUS | Status: DC
  Administered 2024-02-23 – 2024-02-24 (×2): 3 mL via INTRAVENOUS

## 2024-02-21 MED ORDER — SENNOSIDES-DOCUSATE SODIUM 8.6-50 MG PO TABS
2.0000 | ORAL_TABLET | Freq: Every day | ORAL | Status: DC
  Administered 2024-02-21 – 2024-02-23 (×3): 2 via ORAL
  Filled 2024-02-21 (×3): qty 2

## 2024-02-21 MED ORDER — PANTOPRAZOLE SODIUM 40 MG PO TBEC
40.0000 mg | DELAYED_RELEASE_TABLET | Freq: Every day | ORAL | Status: DC
  Administered 2024-02-21 – 2024-02-24 (×4): 40 mg via ORAL
  Filled 2024-02-21 (×4): qty 1

## 2024-02-21 MED ORDER — BISACODYL 10 MG RE SUPP: 10.0000 mg | Freq: Every day | RECTAL | Status: DC | PRN

## 2024-02-21 MED ORDER — RANOLAZINE ER 500 MG PO TB12
500.0000 mg | ORAL_TABLET | Freq: Two times a day (BID) | ORAL | Status: DC
  Administered 2024-02-21 – 2024-02-24 (×7): 500 mg via ORAL
  Filled 2024-02-21 (×7): qty 1

## 2024-02-21 MED ORDER — ROSUVASTATIN CALCIUM 20 MG PO TABS
40.0000 mg | ORAL_TABLET | Freq: Every day | ORAL | Status: DC
  Administered 2024-02-22 – 2024-02-23 (×2): 40 mg via ORAL
  Filled 2024-02-21 (×2): qty 2

## 2024-02-21 MED ORDER — IPRATROPIUM-ALBUTEROL 0.5-2.5 (3) MG/3ML IN SOLN
3.0000 mL | RESPIRATORY_TRACT | Status: AC
  Administered 2024-02-21 (×3): 3 mL via RESPIRATORY_TRACT
  Filled 2024-02-21: qty 6
  Filled 2024-02-21: qty 3

## 2024-02-21 MED ORDER — ESCITALOPRAM OXALATE 10 MG PO TABS
20.0000 mg | ORAL_TABLET | Freq: Every morning | ORAL | Status: DC
  Administered 2024-02-23 – 2024-02-24 (×2): 20 mg via ORAL
  Filled 2024-02-21 (×2): qty 2

## 2024-02-21 MED ORDER — ALFUZOSIN HCL ER 10 MG PO TB24
10.0000 mg | ORAL_TABLET | Freq: Every day | ORAL | Status: DC
  Administered 2024-02-22 – 2024-02-24 (×3): 10 mg via ORAL
  Filled 2024-02-21 (×4): qty 1

## 2024-02-21 MED ORDER — NITROGLYCERIN 0.4 MG SL SUBL
0.4000 mg | SUBLINGUAL_TABLET | SUBLINGUAL | Status: DC | PRN
  Administered 2024-02-21 – 2024-02-23 (×2): 0.4 mg via SUBLINGUAL
  Filled 2024-02-21 (×3): qty 1

## 2024-02-21 MED ORDER — OXYCODONE HCL 5 MG PO TABS
30.0000 mg | ORAL_TABLET | Freq: Once | ORAL | Status: AC
  Administered 2024-02-21: 30 mg via ORAL
  Filled 2024-02-21: qty 6

## 2024-02-21 MED ORDER — LEVOTHYROXINE SODIUM 75 MCG PO TABS
75.0000 ug | ORAL_TABLET | Freq: Every day | ORAL | Status: DC
  Administered 2024-02-22 – 2024-02-24 (×3): 75 ug via ORAL
  Filled 2024-02-21 (×3): qty 1

## 2024-02-21 MED ORDER — METHYLPREDNISOLONE SODIUM SUCC 125 MG IJ SOLR
125.0000 mg | Freq: Once | INTRAMUSCULAR | Status: AC
  Administered 2024-02-21: 125 mg via INTRAVENOUS
  Filled 2024-02-21: qty 2

## 2024-02-21 MED ORDER — ACETAMINOPHEN 325 MG PO TABS
650.0000 mg | ORAL_TABLET | Freq: Four times a day (QID) | ORAL | Status: DC | PRN
  Administered 2024-02-23 – 2024-02-24 (×2): 650 mg via ORAL
  Filled 2024-02-21 (×2): qty 2

## 2024-02-21 MED ORDER — ALBUTEROL SULFATE (2.5 MG/3ML) 0.083% IN NEBU: 2.5000 mg | INHALATION_SOLUTION | RESPIRATORY_TRACT | Status: DC | PRN

## 2024-02-21 MED ORDER — ONDANSETRON HCL 4 MG/2ML IJ SOLN: 4.0000 mg | Freq: Four times a day (QID) | INTRAMUSCULAR | Status: DC | PRN

## 2024-02-21 MED ORDER — DUTASTERIDE 0.5 MG PO CAPS
0.5000 mg | ORAL_CAPSULE | Freq: Every day | ORAL | Status: DC
  Administered 2024-02-23 – 2024-02-24 (×2): 0.5 mg via ORAL
  Filled 2024-02-21 (×5): qty 1

## 2024-02-21 MED ORDER — METHYLPREDNISOLONE SODIUM SUCC 40 MG IJ SOLR
40.0000 mg | Freq: Two times a day (BID) | INTRAMUSCULAR | Status: DC
  Administered 2024-02-21 – 2024-02-22 (×3): 40 mg via INTRAVENOUS
  Filled 2024-02-21 (×3): qty 1

## 2024-02-21 MED ORDER — SODIUM CHLORIDE 0.9 % IV SOLN: INTRAVENOUS | Status: AC | PRN

## 2024-02-21 MED ORDER — HEPARIN SODIUM (PORCINE) 5000 UNIT/ML IJ SOLN
5000.0000 [IU] | Freq: Three times a day (TID) | INTRAMUSCULAR | Status: DC
  Administered 2024-02-21 – 2024-02-24 (×8): 5000 [IU] via SUBCUTANEOUS
  Filled 2024-02-21 (×8): qty 1

## 2024-02-21 MED ORDER — MEMANTINE HCL 10 MG PO TABS
10.0000 mg | ORAL_TABLET | Freq: Two times a day (BID) | ORAL | Status: DC
  Administered 2024-02-21 – 2024-02-24 (×7): 10 mg via ORAL
  Filled 2024-02-21 (×7): qty 1

## 2024-02-21 MED ORDER — DULOXETINE HCL 30 MG PO CPEP
30.0000 mg | ORAL_CAPSULE | Freq: Every day | ORAL | Status: DC
  Administered 2024-02-21 – 2024-02-24 (×4): 30 mg via ORAL
  Filled 2024-02-21 (×4): qty 1

## 2024-02-21 MED ORDER — TRAZODONE HCL 50 MG PO TABS
50.0000 mg | ORAL_TABLET | Freq: Every evening | ORAL | Status: DC | PRN
  Administered 2024-02-23: 50 mg via ORAL
  Filled 2024-02-21: qty 1

## 2024-02-21 MED ORDER — ISOSORBIDE MONONITRATE ER 60 MG PO TB24
120.0000 mg | ORAL_TABLET | Freq: Every morning | ORAL | Status: DC
  Administered 2024-02-21 – 2024-02-24 (×4): 120 mg via ORAL
  Filled 2024-02-21 (×4): qty 2

## 2024-02-21 MED ORDER — OXYCODONE HCL 5 MG PO TABS
10.0000 mg | ORAL_TABLET | Freq: Four times a day (QID) | ORAL | Status: DC | PRN
  Administered 2024-02-21 – 2024-02-24 (×8): 10 mg via ORAL
  Filled 2024-02-21 (×8): qty 2

## 2024-02-21 MED ORDER — GABAPENTIN 100 MG PO CAPS
100.0000 mg | ORAL_CAPSULE | Freq: Three times a day (TID) | ORAL | Status: DC
  Administered 2024-02-21 – 2024-02-24 (×9): 100 mg via ORAL
  Filled 2024-02-21 (×9): qty 1

## 2024-02-21 MED ORDER — QUETIAPINE FUMARATE 25 MG PO TABS
25.0000 mg | ORAL_TABLET | Freq: Every evening | ORAL | Status: DC | PRN
  Administered 2024-02-21: 25 mg via ORAL
  Filled 2024-02-21: qty 1

## 2024-02-21 MED ORDER — ASPIRIN 81 MG PO TBEC
81.0000 mg | DELAYED_RELEASE_TABLET | Freq: Every day | ORAL | Status: DC
  Administered 2024-02-21 – 2024-02-24 (×4): 81 mg via ORAL
  Filled 2024-02-21 (×4): qty 1

## 2024-02-21 MED ORDER — IPRATROPIUM-ALBUTEROL 0.5-2.5 (3) MG/3ML IN SOLN
3.0000 mL | Freq: Four times a day (QID) | RESPIRATORY_TRACT | Status: DC
  Administered 2024-02-21 – 2024-02-24 (×11): 3 mL via RESPIRATORY_TRACT
  Filled 2024-02-21 (×12): qty 3

## 2024-02-21 MED ORDER — POLYETHYLENE GLYCOL 3350 17 G PO PACK
17.0000 g | PACK | Freq: Every day | ORAL | Status: DC | PRN
  Administered 2024-02-24: 17 g via ORAL
  Filled 2024-02-21: qty 1

## 2024-02-21 MED ORDER — SODIUM CHLORIDE 0.9% FLUSH: 3.0000 mL | INTRAVENOUS | Status: DC | PRN

## 2024-02-21 NOTE — ED Notes (Signed)
 Per family pt normally wears O2 at home while sleeping.  Pt is hypoxic in triage and placed on 2L [place] with sats increasing to 94

## 2024-02-21 NOTE — Plan of Care (Signed)
  Problem: Education: Goal: Knowledge of General Education information will improve Description: Including pain rating scale, medication(s)/side effects and non-pharmacologic comfort measures Outcome: Progressing   Problem: Clinical Measurements: Goal: Ability to maintain clinical measurements within normal limits will improve Outcome: Progressing Goal: Will remain free from infection Outcome: Progressing Goal: Respiratory complications will improve Outcome: Progressing   Problem: Activity: Goal: Risk for activity intolerance will decrease Outcome: Progressing   Problem: Nutrition: Goal: Adequate nutrition will be maintained Outcome: Progressing   Problem: Coping: Goal: Level of anxiety will decrease Outcome: Progressing

## 2024-02-21 NOTE — ED Notes (Addendum)
 Patient is resting comfortably.  Complains of headache.

## 2024-02-21 NOTE — H&P (Signed)
 History and Physical   Patient: Dustin [name] DOB: 1933-12-31 DOA: 02/20/2024 DOS: the patient was seen and examined on 02/21/2024 PCP: [name], MD  Patient coming from: Home  Chief Complaint:  Chief Complaint  Patient presents with   Chest Pain   HPI: Dustin [name] is a 89 y.o. male with medical history significant for past medical history   of Alzheimer's disease, carotid artery disease, coronary artery disease (Multivessel s/p CABG 1996. b. DESx2 to SVG-PDA in 2012 c. DES to distal LM in 11/2019 d. cath in 02/2021 showing patent LM stent, patent LIMA-LAD, patent SVG-OM1 with chronically occluded jump limb to OM2 and chronic occlusion of SVG-D2 and SVG-PDA and no targets for intervention), hypothyroidism, hypertension, OSA, hypothyroidism, HLD, depression, history of left lung radiation therapy, with chronic hypoxic respiratory failure but typically uses only 2 L of oxygen at night at a time as needed and BPH.. -- Presented to ED with complaints of cough, malaise, fatigue and myalgia as well as lethargy for the last 5 days or so -Patient is now requiring oxygen at the time -Patient's son who lives with him tested positive for flu--and pretty sick and quite hypoxic--patient's son was not vaccinated against the flu -Patient did not get his flu vaccine--he is also testing positive for influenza A, however he still requiring 2 L of oxygen despite his advanced age and comorbidities - No Nausea, Vomiting or Diarrhea -Apparently had fevers and chills at home Tmax here is 99.9 -In the ED chest x-ray with postradiation changes in left upper lobe, no new acute cardiopulmonary findings -Troponin is not elevated -COVID and RSV negative, influenza A+ -D-dimer adjusted for age is not elevated and is close to prior baselines -WBC 6.1 hemoglobin is 11.7 which is close to prior platelets 167 -Sodium is 132, creatinine 1.1 bicarb 28 potassium 3.9 -EKG with no LBB, no new acute  changes  Review of Systems: As mentioned in the history of present illness. All other systems reviewed and are negative. Past Medical History:  Diagnosis Date   Alzheimer disease (HCC)    Anxiety    Arthritis    Atrophic gastritis    a. By EGD 02/2013.   Carotid artery disease (HCC)    a. mild-mod plaque, <50% stenosis bilat by duplex 2018.   Coronary atherosclerosis of native coronary artery    a. Multivessel s/p CABG 1996. b. DESx2 to SVG-PDA in 2012 c. DES to distal LM in 11/2019 d. cath in 02/2021 showing patent LM stent, patent LIMA-LAD, patent SVG-OM1 with chronically occluded jump limb to OM2 and chronic occlusion of SVG-D2 and SVG-PDA and no targets for intervention   DDD (degenerative disc disease)    Chronic back pain   Dementia (HCC)    Enlarged prostate    Essential hypertension    Hematuria    History of radiation therapy    Left Lung- 04/26/23-Dr. Antony Blackbird   Hypothyroidism    LBBB (left bundle branch block)    MI (myocardial infarction) (HCC)    Mixed hyperlipidemia    OA (osteoarthritis)    OSA (obstructive sleep apnea)    Pneumonia due to COVID-19 virus    February 2021   Sinus bradycardia    a. Aricept and BB discontinued due to this.   Past Surgical History:  Procedure Laterality Date   CATARACT EXTRACTION W/PHACO Left 03/22/2022   Procedure: CATARACT EXTRACTION PHACO AND INTRAOCULAR LENS PLACEMENT (IOC);  Surgeon: Fabio Pierce, MD;  Location: [facility];  Service:  Ophthalmology;  Laterality: Left;  CDE: 25.22   COLONOSCOPY  08/03/2004   Jenkins-numerous large diverticula in the descending, transverse, descending, and sigmoid colon. Otherwise normal exam.   COLONOSCOPY  07/12/2012   RMR: External hemorrhoidal tag; multiple rectal and colonic polyps removed and/or treated as described above. Pancolonic diverticulosis. Bx-tubular adenomas, rectal hyperplastic polyp. next colonoscopy in 06/2015.   COLONOSCOPY N/A 06/22/2016   Procedure: COLONOSCOPY;  Surgeon: Franky Macho, MD;  Location: [facility];  Service: Gastroenterology;  Laterality: N/A;  730   CORONARY ANGIOPLASTY WITH STENT PLACEMENT     CORONARY ARTERY BYPASS GRAFT  1996   LIMA to LAD, SVG to D2, SVG to PDA, SVG to OM1 and OM2   CORONARY STENT INTERVENTION N/A 12/10/2019   Procedure: CORONARY STENT INTERVENTION;  Surgeon: Kathleene Hazel, MD;  Location: [facility];  Service: Cardiovascular;  Laterality: N/A;   CORONARY ULTRASOUND/IVUS N/A 12/10/2019   Procedure: Intravascular Ultrasound/IVUS;  Surgeon: Kathleene Hazel, MD;  Location: [facility];  Service: Cardiovascular;  Laterality: N/A;   ESOPHAGOGASTRODUODENOSCOPY N/A 03/16/2013   Procedure: ESOPHAGOGASTRODUODENOSCOPY (EGD);  Surgeon: Corbin Ade, MD;  Location: [facility];  Service: Endoscopy;  Laterality: N/A;  12:00-moved to 1030 Leigh Ann notified pt   ESOPHAGOGASTRODUODENOSCOPY N/A 03/06/2013   Procedure: ESOPHAGOGASTRODUODENOSCOPY (EGD);  Surgeon: Corbin Ade, MD;  Location: [facility];  Service: Endoscopy;  Laterality: N/A;   HERNIA REPAIR     LEFT HEART CATH AND CORS/GRAFTS ANGIOGRAPHY N/A 12/10/2019   Procedure: LEFT HEART CATH AND CORS/GRAFTS ANGIOGRAPHY;  Surgeon: Kathleene Hazel, MD;  Location: [facility];  Service: Cardiovascular;  Laterality: N/A;   LEFT HEART CATH AND CORS/GRAFTS ANGIOGRAPHY N/A 03/02/2021   Procedure: LEFT HEART CATH AND CORS/GRAFTS ANGIOGRAPHY;  Surgeon: Yvonne Kendall, MD;  Location: [facility];  Service: Cardiovascular;  Laterality: N/A;   Social History:  reports that he quit smoking about 29 years ago. His smoking use included cigarettes. He started smoking about 69 years ago. He has a 80 pack-year smoking history. He has never used smokeless tobacco. He reports that he does not drink alcohol and does not use drugs.  No Known Allergies  Family History  Problem Relation Age of Onset   Heart disease Other    Heart attack Mother     Colon cancer Neg Hx     Prior to Admission medications   Medication Sig Start Date End Date Taking? Authorizing Provider  meclizine (ANTIVERT) 25 MG tablet Take 25 mg by mouth 4 (four) times daily as needed. 12/12/23  Yes [provider]  albuterol (PROVENTIL) (2.5 MG/3ML) 0.083% nebulizer solution Take 3 mLs (2.5 mg total) by nebulization every 2 (two) hours as needed for wheezing or shortness of breath. 11/24/22   Shon Hale, MD  albuterol (VENTOLIN HFA) 108 (90 Base) MCG/ACT inhaler Inhale 2 puffs into the lungs every 6 (six) hours as needed for wheezing or shortness of breath. 11/24/22   Shon Hale, MD  alfuzosin (UROXATRAL) 10 MG 24 hr tablet TAKE 1 TABLET DAILY WITH BREAKFAST. 05/10/23   McKenzie, Mardene Celeste, MD  aspirin EC 81 MG tablet Take 1 tablet (81 mg total) by mouth daily. Swallow whole. 02/14/23   Sharlene Dory, NP  Budeson-Glycopyrrol-Formoterol (BREZTRI AEROSPHERE) 160-9-4.8 MCG/ACT AERO Inhale 2 puffs into the lungs in the morning and at bedtime. 03/16/23   Coralyn Helling, MD  clopidogrel (PLAVIX) 75 MG tablet Take 1 tablet (75 mg total) by mouth daily.  11/24/22   Shon Hale, MD  docusate sodium (COLACE) 100 MG capsule Take by mouth as directed. 03/31/23   [provider]  DULoxetine (CYMBALTA) 30 MG capsule Take 30 mg by mouth daily. 05/10/23   [provider]  dutasteride (AVODART) 0.5 MG capsule TAKE ONE CAPSULE BY MOUTH ONCE DAILY IN THE AFTERNOON. 08/15/23   McKenzie, Mardene Celeste, MD  escitalopram (LEXAPRO) 20 MG tablet Take 20 mg by mouth every morning.     [provider]  furosemide (LASIX) 20 MG tablet Take 1 tablet (20 mg total) by mouth 2 (two) times daily. For fluid 11/24/22   Shon Hale, MD  gabapentin (NEURONTIN) 100 MG capsule Take 100 mg by mouth 3 (three) times daily. 03/04/23   [provider]  guaiFENesin (MUCINEX) 600 MG 12 hr tablet Take 1 tablet (600 mg total) by mouth 2 (two) times daily as needed for  to loosen phlegm or cough. 01/16/24   Glenford Bayley, NP  isosorbide mononitrate (IMDUR) 120 MG 24 hr tablet Take 1 tablet (120 mg total) by mouth every morning. 11/24/22   Shon Hale, MD  levothyroxine (SYNTHROID) 75 MCG tablet Take 75 mcg by mouth daily. 02/23/22   [provider]  memantine (NAMENDA) 10 MG tablet Take 10 mg by mouth 2 (two) times daily.  03/05/16   [provider]  nitroGLYCERIN (NITROSTAT) 0.4 MG SL tablet Place 1 tablet (0.4 mg total) under the tongue every 5 (five) minutes as needed for chest pain. 11/24/22   Shon Hale, MD  oxycodone (ROXICODONE) 30 MG immediate release tablet Take 30 mg by mouth every 6 (six) hours as needed for pain. 09/02/21   [provider]  pantoprazole (PROTONIX) 40 MG tablet Take 1 tablet (40 mg total) by mouth 2 (two) times daily for 14 days, THEN 1 tablet (40 mg total) daily. 02/14/23 02/28/24  Sharlene Dory, NP  potassium chloride SA (KLOR-CON M) 20 MEQ tablet Take 1 tablet (20 mEq total) by mouth daily. 11/24/22   Shon Hale, MD  promethazine (PHENERGAN) 25 MG tablet Take 25 mg by mouth 4 (four) times daily as needed. 06/16/23   [provider]  QUEtiapine (SEROQUEL) 25 MG tablet Take 25 mg by mouth at bedtime as needed (sleep). 11/19/19   [provider]  ranolazine (RANEXA) 1000 MG SR tablet Take 1 tablet (1,000 mg total) by mouth 2 (two) times daily. 11/24/22   Shon Hale, MD  ranolazine (RANEXA) 500 MG 12 hr tablet Take 500 mg by mouth 2 (two) times daily. 07/06/23   [provider]  risperiDONE (RISPERDAL) 0.25 MG tablet Take 0.25 mg by mouth at bedtime. 03/18/23   [provider]  rosuvastatin (CRESTOR) 40 MG tablet Take 1 tablet (40 mg total) by mouth daily at 6 PM. 11/24/22   Shon Hale, MD    Physical Exam: Vitals:   02/21/24 1445 02/21/24 1500 02/21/24 1815 02/21/24 2030  BP: (!) 142/59 133/86 136/78 (!) 174/67  Pulse: 67 69 68 70  Resp: 17 17 16  19   Temp:   98.7 F (37.1 C) 97.7 F (36.5 C)  TempSrc:    Oral  SpO2: 93% 90% 94% 97%  Weight:      Height:        Physical Exam  Gen:- Awake Alert, in no acute distress , HEENT:- [place].AT, No sclera icterus Nose-[place] 2L/min Neck-Supple Neck,No JVD,.  Lungs-diminished breath sounds, scattered wheezes bilaterally  CV- S1, S2 normal, RRR, prior sternotomy scar Abd-  +  ve B.Sounds, Abd Soft, No tenderness,    Extremity/Skin:-   good pedal pulses  Psych-affect is appropriate, oriented x3 Neuro-generalized weakness no new focal deficits, no tremors  Data Reviewed: -In the ED chest x-ray with postradiation changes in left upper lobe, no new acute cardiopulmonary findings -Troponin is not elevated -COVID and RSV negative, influenza A+ -D-dimer adjusted for age is not elevated and is close to prior baselines -WBC 6.1 hemoglobin is 11.7 which is close to prior platelets 167 -Sodium is 132, creatinine 1.1 bicarb 28 potassium 3.9 -EKG with no LBB, no new acute changes  Assessment and Plan:  1) influenza A infection--- patient was vaccinated -Treat empirically with Tamiflu, Solu-Medrol, bronchodilators mucolytic's and supportive care  2) acute COPD exacerbation--- due to #1 above -Prior history of left upper lobe radiation therapy -iv Solu-Medrol bronchodilators and mucolytics as above -Hold off on antibiotics given viral etiology  3) acute on chronic hypoxic respiratory failure--due to #1 and #2 above -At baseline patient uses 2 L of oxygen nightly as needed -At this time patient is requiring continuous O2 at 2 L via nasal cannula -Management as above #1  4)CAD--(Multivessel s/p CABG 1996. b. DESx2 to SVG-PDA in 2012 c. DES to distal LM in 11/2019 d. cath in 02/2021 showing patent LM stent, patent LIMA-LAD, patent SVG-OM1 with chronically occluded jump limb to OM2 and chronic occlusion of SVG-D2 and SVG-PDA and no targets for intervention)--- ruled out already for ACS by EKG and serial  troponins -Continue Ranexa, Plavix and aspirin, Crestor and isosorbide  5)hypothyroidism--- continue levothyroxine  6) BPH--- continue alfuzosin and Avodart  7)GERD--continue Protonix  8)Class 1 Obesity/OSA--- CPAP/BiPAP nightly advised -Low calorie diet, portion control and increase physical activity discussed with patient -Body mass index is 32.78 kg/m.  9)Depression/dementia--stable, continue Cymbalta, Namenda, Lexapro and gabapentin   Advance Care Planning:   Code Status: Full Code   Family Communication: Patient's son is primary contact  Severity of Illness: The appropriate patient status for this patient is INPATIENT. Inpatient status is judged to be reasonable and necessary in order to provide the required intensity of service to ensure the patient's safety. The patient's presenting symptoms, physical exam findings, and initial radiographic and laboratory data in the context of their chronic comorbidities is felt to place them at high risk for further clinical deterioration. Furthermore, it is not anticipated that the patient will be medically stable for discharge from the hospital within 2 midnights of admission.   * I certify that at the point of admission it is my clinical judgment that the patient will require inpatient hospital care spanning beyond 2 midnights from the point of admission due to high intensity of service, high risk for further deterioration and high frequency of surveillance required.*  Author: Shon Hale, MD 02/21/2024 8:33 PM  For on call review [email or web address].

## 2024-02-21 NOTE — ED Provider Notes (Signed)
 [place] EMERGENCY DEPARTMENT AT [facility] Provider Note   CSN: [id number] Arrival date & time: 02/20/24  2218     History  Chief Complaint  Patient presents with   Chest Pain    Dustin [name] is a 89 y.o. male.  Poor historian. Initially states he has been sob w/ chest pain for 3 years. Then 3 weeks. Ultimately stated it was probably 3 days. (On previous interview with his son also in the ED, he stated his father had been sick for 5 days). Has had productive cough. Pleuritic bilateral chest pain. H/o COPD, on oxygen PRN at night. Hasn't tried to many medications at home.    Chest Pain      Home Medications Prior to Admission medications   Medication Sig Start Date End Date Taking? Authorizing Provider  albuterol (PROVENTIL) (2.5 MG/3ML) 0.083% nebulizer solution Take 3 mLs (2.5 mg total) by nebulization every 2 (two) hours as needed for wheezing or shortness of breath. 11/24/22   Shon Hale, MD  albuterol (VENTOLIN HFA) 108 (90 Base) MCG/ACT inhaler Inhale 2 puffs into the lungs every 6 (six) hours as needed for wheezing or shortness of breath. 11/24/22   Shon Hale, MD  alfuzosin (UROXATRAL) 10 MG 24 hr tablet TAKE 1 TABLET DAILY WITH BREAKFAST. 05/10/23   McKenzie, Mardene Celeste, MD  aspirin EC 81 MG tablet Take 1 tablet (81 mg total) by mouth daily. Swallow whole. 02/14/23   Sharlene Dory, NP  Budeson-Glycopyrrol-Formoterol (BREZTRI AEROSPHERE) 160-9-4.8 MCG/ACT AERO Inhale 2 puffs into the lungs in the morning and at bedtime. 03/16/23   Coralyn Helling, MD  clopidogrel (PLAVIX) 75 MG tablet Take 1 tablet (75 mg total) by mouth daily. 11/24/22   Shon Hale, MD  docusate sodium (COLACE) 100 MG capsule Take by mouth as directed. 03/31/23   [provider]  DULoxetine (CYMBALTA) 30 MG capsule Take 30 mg by mouth daily. 05/10/23   [provider]  dutasteride (AVODART) 0.5 MG capsule TAKE ONE CAPSULE BY MOUTH ONCE DAILY IN THE AFTERNOON.  08/15/23   McKenzie, Mardene Celeste, MD  escitalopram (LEXAPRO) 20 MG tablet Take 20 mg by mouth every morning.     [provider]  furosemide (LASIX) 20 MG tablet Take 1 tablet (20 mg total) by mouth 2 (two) times daily. For fluid 11/24/22   Shon Hale, MD  gabapentin (NEURONTIN) 100 MG capsule Take 100 mg by mouth 3 (three) times daily. 03/04/23   [provider]  guaiFENesin (MUCINEX) 600 MG 12 hr tablet Take 1 tablet (600 mg total) by mouth 2 (two) times daily as needed for to loosen phlegm or cough. 01/16/24   Glenford Bayley, NP  isosorbide mononitrate (IMDUR) 120 MG 24 hr tablet Take 1 tablet (120 mg total) by mouth every morning. 11/24/22   Shon Hale, MD  levothyroxine (SYNTHROID) 75 MCG tablet Take 75 mcg by mouth daily. 02/23/22   [provider]  memantine (NAMENDA) 10 MG tablet Take 10 mg by mouth 2 (two) times daily.  03/05/16   [provider]  nitroGLYCERIN (NITROSTAT) 0.4 MG SL tablet Place 1 tablet (0.4 mg total) under the tongue every 5 (five) minutes as needed for chest pain. 11/24/22   Shon Hale, MD  oxycodone (ROXICODONE) 30 MG immediate release tablet Take 30 mg by mouth every 6 (six) hours as needed for pain. 09/02/21   [provider]  pantoprazole (PROTONIX) 40 MG tablet Take 1 tablet (40 mg total) by mouth 2 (two)  times daily for 14 days, THEN 1 tablet (40 mg total) daily. 02/14/23 02/28/24  Sharlene Dory, NP  potassium chloride SA (KLOR-CON M) 20 MEQ tablet Take 1 tablet (20 mEq total) by mouth daily. 11/24/22   Shon Hale, MD  promethazine (PHENERGAN) 25 MG tablet Take 25 mg by mouth 4 (four) times daily as needed. 06/16/23   [provider]  QUEtiapine (SEROQUEL) 25 MG tablet Take 25 mg by mouth at bedtime as needed (sleep). 11/19/19   [provider]  ranolazine (RANEXA) 1000 MG SR tablet Take 1 tablet (1,000 mg total) by mouth 2 (two) times daily. 11/24/22   Shon Hale, MD  ranolazine  (RANEXA) 500 MG 12 hr tablet Take 500 mg by mouth 2 (two) times daily. 07/06/23   [provider]  risperiDONE (RISPERDAL) 0.25 MG tablet Take 0.25 mg by mouth at bedtime. 03/18/23   [provider]  rosuvastatin (CRESTOR) 40 MG tablet Take 1 tablet (40 mg total) by mouth daily at 6 PM. 11/24/22   Shon Hale, MD      Allergies    Patient has no known allergies.    Review of Systems   Review of Systems  Cardiovascular:  Positive for chest pain.    Physical Exam Updated Vital Signs BP (!) 116/53   Pulse 66   Temp 99.4 F (37.4 C) (Oral)   Resp 16   SpO2 95%  Physical Exam Vitals and nursing note reviewed.  Constitutional:      Appearance: He is well-developed.  HENT:     Head: Normocephalic and atraumatic.  Cardiovascular:     Rate and Rhythm: Normal rate. Rhythm irregular.  Pulmonary:     Effort: Pulmonary effort is normal. No respiratory distress.     Breath sounds: Decreased breath sounds and wheezing present.  Abdominal:     General: There is no distension.     Palpations: Abdomen is soft.  Musculoskeletal:        General: Normal range of motion.     Cervical back: Normal range of motion.     Right lower leg: No edema.     Left lower leg: No edema.  Neurological:     Mental Status: He is alert.     ED Results / Procedures / Treatments   Labs (all labs ordered are listed, but only abnormal results are displayed) Labs Reviewed  RESP PANEL BY RT-PCR (RSV, FLU A&B, COVID)  RVPGX2 - Abnormal; Notable for the following components:      Result Value   Influenza A by PCR POSITIVE (*)    All other components within normal limits  BASIC METABOLIC PANEL - Abnormal; Notable for the following components:   Sodium 132 (*)    Chloride 93 (*)    Glucose, Bld 142 (*)    All other components within normal limits  CBC - Abnormal; Notable for the following components:   RBC 3.77 (*)    Hemoglobin 11.7 (*)    HCT 36.2 (*)    All other components within  normal limits  D-DIMER, QUANTITATIVE - Abnormal; Notable for the following components:   D-Dimer, Quant 0.59 (*)    All other components within normal limits  TROPONIN I (HIGH SENSITIVITY)  TROPONIN I (HIGH SENSITIVITY)    EKG None  Radiology DG Chest Portable 1 View Result Date: 02/21/2024 CLINICAL DATA:  Chest pain, altered mental status EXAM: PORTABLE CHEST 1 VIEW COMPARISON:  CT chest dated 09/20/2023 FINDINGS: Focal patchy nodular opacity in the  left upper lobe, chronic. When correlating with prior CT, this may reflect radiation changes. Mild left basilar scarring with volume loss. Right lung is clear. No pleural effusion or pneumothorax. The heart is normal in size Postsurgical changes related to prior CABG. Thoracic aortic atherosclerosis. Median sternotomy. IMPRESSION: Suspected radiation changes in the left upper lobe. No acute cardiopulmonary disease. Electronically Signed   By: Charline Bills M.D.   On: 02/21/2024 00:17    Procedures .Critical Care  Performed by: Marily Memos, MD Authorized by: Marily Memos, MD   Critical care provider statement:    Critical care time (minutes):  30   Critical care was necessary to treat or prevent imminent or life-threatening deterioration of the following conditions:  Sepsis and respiratory failure   Critical care was time spent personally by me on the following activities:  Development of treatment plan with patient or surrogate, discussions with consultants, evaluation of patient's response to treatment, examination of patient, ordering and review of laboratory studies, ordering and review of radiographic studies, ordering and performing treatments and interventions, pulse oximetry, re-evaluation of patient's condition and review of old charts     Medications Ordered in ED Medications  acetaminophen (TYLENOL) tablet 650 mg (650 mg Oral Given 02/21/24 0459)  oxyCODONE (Oxy IR/ROXICODONE) immediate release tablet 30 mg (30 mg Oral  Given 02/21/24 0255)  ipratropium-albuterol (DUONEB) 0.5-2.5 (3) MG/3ML nebulizer solution 3 mL (3 mLs Nebulization Given 02/21/24 0328)  methylPREDNISolone sodium succinate (SOLU-MEDROL) 125 mg/2 mL injection 125 mg (125 mg Intravenous Given 02/21/24 0300)    ED Course/ Medical Decision Making/ A&P                                 Medical Decision Making Amount and/or Complexity of Data Reviewed Labs: ordered. Radiology: ordered.  Risk Prescription drug management. Decision regarding hospitalization.   89 yo here with sepsis likely related to influenza causign copd exacerbation with acute on chronic hypoxic respiratory failure. No e/o bacterial pneumonia requiring antibitocis. With pleuritic pain, d dimer checked and negative, doubt PE. Rquiring 2L at all times to maintain saturations > 91. Out of the window for tamiflu. D/w Dr. Thomes Dinning with [facility] for admission.   Final Clinical Impression(s) / ED Diagnoses Final diagnoses:  Acute on chronic respiratory failure with hypoxia (HCC)  Influenza  COPD exacerbation [facility])    Rx / DC Orders ED Discharge Orders     None         Taliana Mersereau, Barbara Cower, MD 02/21/24 [phone]

## 2024-02-22 DIAGNOSIS — E66811 Obesity, class 1: Secondary | ICD-10-CM

## 2024-02-22 DIAGNOSIS — I10 Essential (primary) hypertension: Secondary | ICD-10-CM

## 2024-02-22 DIAGNOSIS — F0280 Dementia in other diseases classified elsewhere without behavioral disturbance: Secondary | ICD-10-CM

## 2024-02-22 DIAGNOSIS — N401 Enlarged prostate with lower urinary tract symptoms: Secondary | ICD-10-CM

## 2024-02-22 DIAGNOSIS — G309 Alzheimer's disease, unspecified: Secondary | ICD-10-CM | POA: Diagnosis not present

## 2024-02-22 DIAGNOSIS — J9621 Acute and chronic respiratory failure with hypoxia: Secondary | ICD-10-CM

## 2024-02-22 DIAGNOSIS — J441 Chronic obstructive pulmonary disease with (acute) exacerbation: Secondary | ICD-10-CM

## 2024-02-22 DIAGNOSIS — J101 Influenza due to other identified influenza virus with other respiratory manifestations: Secondary | ICD-10-CM | POA: Diagnosis not present

## 2024-02-22 DIAGNOSIS — N138 Other obstructive and reflux uropathy: Secondary | ICD-10-CM

## 2024-02-22 MED ORDER — BUDESONIDE 0.25 MG/2ML IN SUSP
0.2500 mg | Freq: Two times a day (BID) | RESPIRATORY_TRACT | Status: DC
  Administered 2024-02-22 – 2024-02-24 (×4): 0.25 mg via RESPIRATORY_TRACT
  Filled 2024-02-22 (×4): qty 2

## 2024-02-22 MED ORDER — PREDNISONE 20 MG PO TABS
40.0000 mg | ORAL_TABLET | Freq: Every day | ORAL | Status: DC
  Administered 2024-02-23 – 2024-02-24 (×2): 40 mg via ORAL
  Filled 2024-02-22 (×2): qty 2

## 2024-02-22 NOTE — Assessment & Plan Note (Signed)
 Estimated body mass index is 32.78 kg/m as calculated from the following:   Height as of this encounter: 5\' 9"  (1.753 m).   Weight as of this encounter: 100.7 kg.

## 2024-02-22 NOTE — Progress Notes (Signed)
 PROGRESS NOTE    Dustin [name]  ZOX:[id number] DOB: [birth year] DOA: 02/20/2024 PCP: [name], MD  Subjective: Pt seen and examined. Still feeling poorly. Has been sick for about 2 weeks now. Normally on nocturnal O2 2 L/min.  Pt states his son is also admitted to [facility] for influenza A. Pt lives with son and pt's wife at home.   Hospital Course: HPI: Dustin [name] is a 89 y.o. male with medical history significant for past medical history   of Alzheimer's disease, carotid artery disease, coronary artery disease (Multivessel s/p CABG 1996. b. DESx2 to SVG-PDA in 2012 c. DES to distal LM in 11/2019 d. cath in 02/2021 showing patent LM stent, patent LIMA-LAD, patent SVG-OM1 with chronically occluded jump limb to OM2 and chronic occlusion of SVG-D2 and SVG-PDA and no targets for intervention), hypothyroidism, hypertension, OSA, hypothyroidism, HLD, depression, history of left lung radiation therapy, with chronic hypoxic respiratory failure but typically uses only 2 L of oxygen at night at a time as needed and BPH.. -- Presented to ED with complaints of cough, malaise, fatigue and myalgia as well as lethargy for the last 5 days or so -Patient is now requiring oxygen at the time -Patient's son who lives with him tested positive for flu--and pretty sick and quite hypoxic--patient's son was not vaccinated against the flu -Patient did not get his flu vaccine--he is also testing positive for influenza A, however he still requiring 2 L of oxygen despite his advanced age and comorbidities - No Nausea, Vomiting or Diarrhea -Apparently had fevers and chills at home Tmax here is 99.9 -In the ED chest x-ray with postradiation changes in left upper lobe, no new acute cardiopulmonary findings -Troponin is not elevated -COVID and RSV negative, influenza A+ -D-dimer adjusted for age is not elevated and is close to prior baselines -WBC 6.1 hemoglobin is 11.7 which is close to prior platelets 167 -Sodium  is 132, creatinine 1.1 bicarb 28 potassium 3.9 -EKG with no LBB, no new acute changes  Significant Events: Admitted 02/20/2024 for influenza, A, COPD exacerbation, acute on chronic respiratory failure with hypoxia   Significant Labs: WBC 6.1, Hg 11.7, Plt 167 Na 132, K 3.9, CO2 of 28, BUN 15, scr 1.13\ Influenza A POSITIVE  Significant Imaging Studies: CXR Suspected radiation changes in the left upper lobe. No acute cardiopulmonary disease.  Antibiotic Therapy: Anti-infectives (From admission, onward)    Start     Dose/Rate Route Frequency Ordered Stop   02/21/24 0900  oseltamivir (TAMIFLU) capsule 75 mg        75 mg Oral 2 times daily 02/21/24 8119 02/26/24 0959       Procedures:   Consultants:     Assessment and Plan: * Influenza A 02-22-2024 pt has had symptoms for about 2 weeks. Outside window for tamilflu to be effective. Stop tamiflu. Continue supportive care.  COPD with acute exacerbation (HCC) 02-22-2024 change to po prednisone 40 mg daily. Stop IV solumedrol. Continue with nebs q6h.  Acute on chronic respiratory failure with hypoxia (HCC) - on nocturnal O2 @ 2 L/min. 02-22-2024 pt uses O2 2 L/min at night time. Using O2 continuously now. Due to COPD Exacerbation from influenza A infection. Pt does not have pneumonia.  Obesity, Class I, BMI 30-34.9 Estimated body mass index is 32.78 kg/m as calculated from the following:   Height as of this encounter: 5\' 9"  (1.753 m).   Weight as of this encounter: 100.7 kg.   Benign prostatic hyperplasia with urinary obstruction  02-22-2024 stable. Chronic. On avodart, uroxatral.  Coronary artery disease 02-22-2024 stable. Chronic. On imdur and ranexa. On ASA and crestor.  HYPERTENSION, BENIGN 02-22-2024 stable. Chronic. Continue imdur. Holding lasix due to poor appetite.  Alzheimer disease (HCC) 02-22-2024 stable. Chronic. On namenda bid. Monitor for hospital-acquired delirium. Prn seroquel.  OSA (obstructive sleep  apnea) 02-22-2024 stable. Chronic.   DVT prophylaxis: heparin injection 5,000 Units Start: 02/21/24 2200 SCDs Start: 02/21/24 0819 Place TED hose Start: 02/21/24 0819    Code Status: Full Code Family Communication: no family at bedside Disposition Plan: return home Reason for continuing need for hospitalization: continue supplemental O2. Changed from IV steroids to po steroids. Monitoring respiratory status.  Objective: Vitals:   02/22/24 0009 02/22/24 0408 02/22/24 0734 02/22/24 0819  BP: (!) 148/62 (!) 160/72  (!) 159/60  Pulse: 65 61  62  Resp: 17 16  17   Temp: 98 F (36.7 C) 98 F (36.7 C)  97.8 F (36.6 C)  TempSrc: Oral Axillary  Oral  SpO2: 96% 96% 99% 98%  Weight:      Height:        Intake/Output Summary (Last 24 hours) at 02/22/2024 1145 Last data filed at 02/22/2024 0950 Gross per 24 hour  Intake 240 ml  Output 1275 ml  Net -1035 ml   Filed Weights   02/21/24 1442  Weight: 100.7 kg    Examination:  Physical Exam Vitals and nursing note reviewed.  Constitutional:      General: He is not in acute distress.    Appearance: He is not toxic-appearing or diaphoretic.  HENT:     Head: Normocephalic and atraumatic.     Nose: Nose normal.  Eyes:     General: No scleral icterus. Cardiovascular:     Rate and Rhythm: Normal rate and regular rhythm.     Pulses: Normal pulses.  Pulmonary:     Effort: No respiratory distress.     Breath sounds: Rhonchi present.     Comments: Diffuse rhonchi bilaterally Abdominal:     General: Bowel sounds are normal.     Palpations: Abdomen is soft.     Comments: Ventral hernia  Musculoskeletal:     Right lower leg: No edema.     Left lower leg: No edema.  Skin:    General: Skin is warm and dry.     Capillary Refill: Capillary refill takes less than 2 seconds.  Neurological:     General: No focal deficit present.     Mental Status: He is alert and oriented to person, place, and time.    Data Reviewed: I have  personally reviewed following labs and imaging studies  CBC: Recent Labs  Lab 02/20/24 2308  WBC 6.1  HGB 11.7*  HCT 36.2*  MCV 96.0  PLT 167   Basic Metabolic Panel: Recent Labs  Lab 02/20/24 2308  NA 132*  K 3.9  CL 93*  CO2 28  GLUCOSE 142*  BUN 15  CREATININE 1.13  CALCIUM 8.9   GFR: Estimated Creatinine Clearance: 53.9 mL/min (by C-G formula based on SCr of 1.13 mg/dL). BNP (last 3 results) Recent Labs    07/29/23 1927  BNP 126.0*   Recent Results (from the past 240 hours)  Resp panel by RT-PCR (RSV, Flu A&B, Covid) Anterior Nasal Swab     Status: Abnormal   Collection Time: 02/20/24 11:09 PM   Specimen: Anterior Nasal Swab  Result Value Ref Range Status   SARS Coronavirus 2 by RT PCR NEGATIVE NEGATIVE  Final    Comment: (NOTE) SARS-CoV-2 target nucleic acids are NOT DETECTED.  The SARS-CoV-2 RNA is generally detectable in upper respiratory specimens during the acute phase of infection. The lowest concentration of SARS-CoV-2 viral copies this assay can detect is 138 copies/mL. A negative result does not preclude SARS-Cov-2 infection and should not be used as the sole basis for treatment or other patient management decisions. A negative result may occur with  improper specimen collection/handling, submission of specimen other than nasopharyngeal swab, presence of viral mutation(s) within the areas targeted by this assay, and inadequate number of viral copies(<138 copies/mL). A negative result must be combined with clinical observations, patient history, and epidemiological information. The expected result is Negative.  Fact Sheet for Patients:  BloggerCourse.com  Fact Sheet for Healthcare Providers:  SeriousBroker.it  This test is no t yet approved or cleared by the [place] FDA and  has been authorized for detection and/or diagnosis of SARS-CoV-2 by FDA under an Emergency Use Authorization (EUA).  This EUA will remain  in effect (meaning this test can be used) for the duration of the COVID-19 declaration under Section 564(b)(1) of the Act, 21 U.S.C.section 360bbb-3(b)(1), unless the authorization is terminated  or revoked sooner.       Influenza A by PCR POSITIVE (A) NEGATIVE Final   Influenza B by PCR NEGATIVE NEGATIVE Final    Comment: (NOTE) The Xpert Xpress SARS-CoV-2/FLU/RSV plus assay is intended as an aid in the diagnosis of influenza from Nasopharyngeal swab specimens and should not be used as a sole basis for treatment. Nasal washings and aspirates are unacceptable for Xpert Xpress SARS-CoV-2/FLU/RSV testing.  Fact Sheet for Patients: BloggerCourse.com  Fact Sheet for Healthcare Providers: SeriousBroker.it  This test is not yet approved or cleared by the [place] FDA and has been authorized for detection and/or diagnosis of SARS-CoV-2 by FDA under an Emergency Use Authorization (EUA). This EUA will remain in effect (meaning this test can be used) for the duration of the COVID-19 declaration under Section 564(b)(1) of the Act, 21 U.S.C. section 360bbb-3(b)(1), unless the authorization is terminated or revoked.     Resp Syncytial Virus by PCR NEGATIVE NEGATIVE Final    Comment: (NOTE) Fact Sheet for Patients: BloggerCourse.com  Fact Sheet for Healthcare Providers: SeriousBroker.it  This test is not yet approved or cleared by the [place] FDA and has been authorized for detection and/or diagnosis of SARS-CoV-2 by FDA under an Emergency Use Authorization (EUA). This EUA will remain in effect (meaning this test can be used) for the duration of the COVID-19 declaration under Section 564(b)(1) of the Act, 21 U.S.C. section 360bbb-3(b)(1), unless the authorization is terminated or revoked.  Performed at [facility], [address] [place], [place]  [address]      Radiology Studies: DG Chest Portable 1 View Result Date: 02/21/2024 CLINICAL DATA:  Chest pain, altered mental status EXAM: PORTABLE CHEST 1 VIEW COMPARISON:  CT chest dated 09/20/2023 FINDINGS: Focal patchy nodular opacity in the left upper lobe, chronic. When correlating with prior CT, this may reflect radiation changes. Mild left basilar scarring with volume loss. Right lung is clear. No pleural effusion or pneumothorax. The heart is normal in size Postsurgical changes related to prior CABG. Thoracic aortic atherosclerosis. Median sternotomy. IMPRESSION: Suspected radiation changes in the left upper lobe. No acute cardiopulmonary disease. Electronically Signed   By: Charline Bills M.D.   On: 02/21/2024 00:17    Scheduled Meds:  alfuzosin  10 mg Oral Q breakfast  aspirin EC  81 mg Oral Daily   budesonide (PULMICORT) nebulizer solution  0.25 mg Nebulization BID   clopidogrel  75 mg Oral Daily   DULoxetine  30 mg Oral Daily   dutasteride  0.5 mg Oral Daily   escitalopram  20 mg Oral q morning   gabapentin  100 mg Oral TID   heparin  5,000 Units Subcutaneous Q8H   ipratropium-albuterol  3 mL Nebulization Q6H   isosorbide mononitrate  120 mg Oral q morning   levothyroxine  75 mcg Oral Daily   memantine  10 mg Oral BID   pantoprazole  40 mg Oral Daily   [START ON 02/23/2024] predniSONE  40 mg Oral Q breakfast   ranolazine  500 mg Oral BID   rosuvastatin  40 mg Oral q1800   senna-docusate  2 tablet Oral QHS   sodium chloride flush  3 mL Intravenous Q12H   sodium chloride flush  3 mL Intravenous Q12H   Continuous Infusions:   LOS: 1 day   Time spent: 40 minutes  Carollee Herter, DO  [name]  02/22/2024, 11:45 AM

## 2024-02-22 NOTE — Assessment & Plan Note (Signed)
 02-22-2024 stable. Chronic. 02-23-2024 stable. Chronic.  02-24-2024 stable. DC to home

## 2024-02-22 NOTE — Assessment & Plan Note (Addendum)
 02-22-2024 stable. Chronic. On imdur and ranexa. On ASA and crestor. 02-23-2024 stable. Chronic.  02-24-2024 pt not on betablocker due to baseline bradycardia. Documented by cardiology. Refer back to Dr. Diona Browner to be seen. Only wants to see physician. Continue ranexa and imdur. Prn SL NTG. Continue ASA and crestor

## 2024-02-22 NOTE — Assessment & Plan Note (Signed)
 02-22-2024 change to po prednisone 40 mg daily. Stop IV solumedrol. Continue with nebs q6h. 02-23-2024 still wheezing some today. But better air exchange. Remains on 2 L/min O2, prednisone 40 mg daily, QID nebs. Pt has home O2 already, home nebulizer machine. Pt requesting discharge today. Discussed with him that he need PT evaluation to see if he stable to walk and be safe at home. Pt agrees to stay for PT evaluation today. If PT thinks he is stable to go home, then pt can be discharged to home and he can f/u with PCP early next week.  02-24-2024 DC to home. Pt has home nebulizer machine and albuterol neb medication. DC with prednisone over next 6 days.

## 2024-02-22 NOTE — Assessment & Plan Note (Signed)
 02-22-2024 stable. Chronic. Continue imdur. Holding lasix due to poor appetite. 02-23-2024 stable. Chronic.  02-24-2024 stable. Hold lasix for 1 week. Then restart.

## 2024-02-22 NOTE — Assessment & Plan Note (Addendum)
 02-22-2024 stable. Chronic. On namenda bid. Monitor for hospital-acquired delirium. Prn seroquel. 02-23-2024 stable. Chronic.  02-24-2024 stable. DC to home

## 2024-02-22 NOTE — Hospital Course (Signed)
 HPI: Dustin [name] is a 89 y.o. male with medical history significant for past medical history   of Alzheimer's disease, carotid artery disease, coronary artery disease (Multivessel s/p CABG 1996. b. DESx2 to SVG-PDA in 2012 c. DES to distal LM in 11/2019 d. cath in 02/2021 showing patent LM stent, patent LIMA-LAD, patent SVG-OM1 with chronically occluded jump limb to OM2 and chronic occlusion of SVG-D2 and SVG-PDA and no targets for intervention), hypothyroidism, hypertension, OSA, hypothyroidism, HLD, depression, history of left lung radiation therapy, with chronic hypoxic respiratory failure but typically uses only 2 L of oxygen at night at a time as needed and BPH.. -- Presented to ED with complaints of cough, malaise, fatigue and myalgia as well as lethargy for the last 5 days or so -Patient is now requiring oxygen at the time -Patient's son who lives with him tested positive for flu--and pretty sick and quite hypoxic--patient's son was not vaccinated against the flu -Patient did not get his flu vaccine--he is also testing positive for influenza A, however he still requiring 2 L of oxygen despite his advanced age and comorbidities - No Nausea, Vomiting or Diarrhea -Apparently had fevers and chills at home Tmax here is 99.9 -In the ED chest x-ray with postradiation changes in left upper lobe, no new acute cardiopulmonary findings -Troponin is not elevated -COVID and RSV negative, influenza A+ -D-dimer adjusted for age is not elevated and is close to prior baselines -WBC 6.1 hemoglobin is 11.7 which is close to prior platelets 167 -Sodium is 132, creatinine 1.1 bicarb 28 potassium 3.9 -EKG with no LBB, no new acute changes  Significant Events: Admitted 02/20/2024 for influenza, A, COPD exacerbation, acute on chronic respiratory failure with hypoxia   Significant Labs: WBC 6.1, Hg 11.7, Plt 167 Na 132, K 3.9, CO2 of 28, BUN 15, scr 1.13\ Influenza A POSITIVE  Significant Imaging  Studies: CXR Suspected radiation changes in the left upper lobe. No acute cardiopulmonary disease.  Antibiotic Therapy: Anti-infectives (From admission, onward)    Start     Dose/Rate Route Frequency Ordered Stop   02/21/24 0900  oseltamivir (TAMIFLU) capsule 75 mg        75 mg Oral 2 times daily 02/21/24 2956 02/26/24 0959       Procedures:   Consultants:

## 2024-02-22 NOTE — Assessment & Plan Note (Signed)
 02-22-2024 stable. Chronic. On avodart, uroxatral. 02-23-2024 stable. Chronic. 02-24-2024 stable. Chronic. DC to home.

## 2024-02-22 NOTE — TOC Initial Note (Signed)
 Transition of Care [facility]) - Initial/Assessment Note    Patient Details  Name: Dustin [name] MRN: [id number] Date of Birth: [birth year]  [facility] CM/SW Contact:    Leitha Bleak, RN Phone Number: 02/22/2024, 12:44 PM  Clinical Narrative:  Patient admitted with Influenza A. Considered to be a high risk for readmission.  Patient lives with his wife. He is active with [employer] for home oxygen. He states he uses it all night and only as needed during the day. He has all the DME he needs in the home. He is open to home health, but rather his wife make that decision if needed at discharge. TOC following.               Expected Discharge Plan: Home/Self Care Barriers to Discharge: Continued Medical Work up  Patient Goals and CMS Choice Patient states their goals for this hospitalization and ongoing recovery are:: return home CMS Medicare.gov Compare Post Acute Care list provided to:: Patient Choice offered to / list presented to : Patient     Expected Discharge Plan and Services      Living arrangements for the past 2 months: Single Family Home          Prior Living Arrangements/Services Living arrangements for the past 2 months: Single Family Home Lives with:: Spouse Patient language and need for interpreter reviewed:: Yes        Need for Family Participation in Patient Care: Yes (Comment) Care giver support system in place?: Yes (comment) Current home services: DME Criminal Activity/Legal Involvement Pertinent to Current Situation/Hospitalization: No - Comment as needed  Activities of Daily Living   ADL Screening (condition at time of admission) Independently performs ADLs?: Yes (appropriate for developmental age) Is the patient deaf or have difficulty hearing?: No Does the patient have difficulty seeing, even when wearing glasses/contacts?: No Does the patient have difficulty concentrating, remembering, or making decisions?: No  Permission Sought/Granted        Permission granted to share info w Relationship: wife    Emotional Assessment     Affect (typically observed): Accepting Orientation: : Oriented to Self, Oriented to Place, Oriented to  Time, Oriented to Situation   Psych Involvement: No (comment)  Admission diagnosis:  Acute exacerbation of chronic obstructive pulmonary disease (COPD) (HCC) [J44.1] COPD exacerbation (HCC) [J44.1] Influenza [J11.1] Acute on chronic respiratory failure with hypoxia (HCC) [J96.21] Patient Active Problem List   Diagnosis Date Noted   Obesity, Class I, BMI 30-34.9 02/22/2024   COPD with acute exacerbation (HCC) 02/21/2024   Influenza A 02/21/2024   Primary malignant neoplasm of left lower lobe of lung (HCC) 04/05/2023   Dysuria 10/24/2020   COPD (chronic obstructive pulmonary disease) (HCC) 12/10/2019   Fall 11/07/2018   Depression 11/07/2018   Benign prostatic hyperplasia with urinary obstruction 11/07/2018   Chronic pain 11/07/2018   Pressure injury of skin 02/15/2017   Chronic diastolic CHF (congestive heart failure) (HCC) 12/24/2016   Sinus bradycardia 12/23/2016   Foot pain, bilateral 12/23/2016   Hypokalemia 04/05/2016   Hypothyroidism 04/03/2016   Dementia (HCC) 04/03/2016   Acute on chronic respiratory failure with hypoxia (HCC) - on nocturnal O2 @ 2 L/min. 04/03/2016   Memory loss 04/20/2015   Neck pain on right side 08/29/2014   Neck pain 08/29/2014   Left-sided weakness 06/28/2014   Tubular adenoma of colon 03/05/2013   Anorexia 03/05/2013   Loss of weight 03/05/2013   Chronic constipation 07/04/2012   Hyperlipidemia 10/01/2009   OSA (obstructive  sleep apnea) 10/01/2009   Alzheimer disease (HCC) 10/01/2009   HYPERTENSION, BENIGN 10/01/2009   Coronary artery disease 10/01/2009   PCP:  [name], MD Pharmacy:   [facility] - [place], [place] - [address] [place] [address] Phone: [phone] Fax: [phone]  [facility] [phone] - [place], [place] - [address] AT [facility] [address] [place] [address] Phone: [phone] Fax: [phone]   Social Drivers of Health (SDOH) Social History: SDOH Screenings   Food Insecurity: No Food Insecurity (02/21/2024)  Housing: Low Risk  (02/21/2024)  Transportation Needs: No Transportation Needs (02/21/2024)  Utilities: Not At Risk (02/21/2024)  Depression (PHQ2-9): Low Risk  (04/05/2023)  Social Connections: Moderately Isolated (02/21/2024)  Tobacco Use: Medium Risk (02/20/2024)   SDOH Interventions:   Readmission Risk Interventions    02/22/2024   12:43 PM 07/30/2023   11:01 AM  Readmission Risk Prevention Plan  Transportation Screening Complete Complete  PCP or Specialist Appt within 3-5 Days Not Complete   HRI or Home Care Consult Complete   Palliative Care Screening Not Applicable   Medication Review (RN Care Manager) Complete Complete  PCP or Specialist appointment within 3-5 days of discharge  Complete  HRI or Home Care Consult  Complete  SW Recovery Care/Counseling Consult  Complete  Palliative Care Screening  Not Applicable  Skilled Nursing Facility  Not Applicable

## 2024-02-22 NOTE — Subjective & Objective (Addendum)
 Pt seen and examined. Pt had angina yesterday requiring topical NTG. Pt uses SL NTG every week. Goes through a whole bottle in 1 month. Pt wants to see Dr. Diona Browner again. Last cards appointment in 01/2023. Saw NP but was not happy with care.  Wants to see physician in clinic.  Wants to go home.  Able to walk with PT. Home health PT ordered.

## 2024-02-22 NOTE — Plan of Care (Signed)

## 2024-02-22 NOTE — Assessment & Plan Note (Signed)
 02-22-2024 pt uses O2 2 L/min at night time. Using O2 continuously now. Due to COPD Exacerbation from influenza A infection. Pt does not have pneumonia. 02-23-2024 continue with supplemental O2.  02-24-2024 pt has home O2 2 L/min. Usually wears at night.

## 2024-02-22 NOTE — Assessment & Plan Note (Signed)
 02-22-2024 pt has had symptoms for about 2 weeks. Outside window for tamilflu to be effective. Stop tamiflu. Continue supportive care. 02-23-2024 continue supportive care.  02-24-2024 DC to home.

## 2024-02-23 ENCOUNTER — Other Ambulatory Visit: Payer: Self-pay

## 2024-02-23 DIAGNOSIS — J101 Influenza due to other identified influenza virus with other respiratory manifestations: Secondary | ICD-10-CM | POA: Diagnosis not present

## 2024-02-23 DIAGNOSIS — J9621 Acute and chronic respiratory failure with hypoxia: Secondary | ICD-10-CM | POA: Diagnosis not present

## 2024-02-23 DIAGNOSIS — G309 Alzheimer's disease, unspecified: Secondary | ICD-10-CM | POA: Diagnosis not present

## 2024-02-23 DIAGNOSIS — J441 Chronic obstructive pulmonary disease with (acute) exacerbation: Secondary | ICD-10-CM | POA: Diagnosis not present

## 2024-02-23 DIAGNOSIS — I2510 Atherosclerotic heart disease of native coronary artery without angina pectoris: Secondary | ICD-10-CM

## 2024-02-23 LAB — TROPONIN I (HIGH SENSITIVITY)
Troponin I (High Sensitivity): 18 ng/L — ABNORMAL HIGH (ref <18)
Troponin I (High Sensitivity): 19 ng/L — ABNORMAL HIGH (ref <18)
Troponin I (High Sensitivity): 16 ng/L (ref <18)

## 2024-02-23 LAB — CBC WITH DIFFERENTIAL/PLATELET
Abs Immature Granulocytes: 0.09 10*3/uL — ABNORMAL HIGH (ref 0.00–0.07)
Basophils Absolute: 0 10*3/uL (ref 0.0–0.1)
Basophils Relative: 0 %
Eosinophils Absolute: 0 10*3/uL (ref 0.0–0.5)
Eosinophils Relative: 0 %
HCT: 37.9 % — ABNORMAL LOW (ref 39.0–52.0)
Hemoglobin: 12.1 g/dL — ABNORMAL LOW (ref 13.0–17.0)
Immature Granulocytes: 1 %
Lymphocytes Relative: 6 %
Lymphs Abs: 0.8 10*3/uL (ref 0.7–4.0)
MCH: 30.4 pg (ref 26.0–34.0)
MCHC: 31.9 g/dL (ref 30.0–36.0)
MCV: 95.2 fL (ref 80.0–100.0)
Monocytes Absolute: 0.9 10*3/uL (ref 0.1–1.0)
Monocytes Relative: 7 %
Neutro Abs: 11.4 10*3/uL — ABNORMAL HIGH (ref 1.7–7.7)
Neutrophils Relative %: 86 %
Platelets: 190 10*3/uL (ref 150–400)
RBC: 3.98 MIL/uL — ABNORMAL LOW (ref 4.22–5.81)
RDW: 13.5 % (ref 11.5–15.5)
WBC: 13.1 10*3/uL — ABNORMAL HIGH (ref 4.0–10.5)
nRBC: 0 % (ref 0.0–0.2)

## 2024-02-23 LAB — COMPREHENSIVE METABOLIC PANEL
ALT: 19 U/L (ref 0–44)
AST: 28 U/L (ref 15–41)
Albumin: 3.2 g/dL — ABNORMAL LOW (ref 3.5–5.0)
Alkaline Phosphatase: 43 U/L (ref 38–126)
Anion gap: 10 (ref 5–15)
BUN: 27 mg/dL — ABNORMAL HIGH (ref 8–23)
CO2: 30 mmol/L (ref 22–32)
Calcium: 8.9 mg/dL (ref 8.9–10.3)
Chloride: 95 mmol/L — ABNORMAL LOW (ref 98–111)
Creatinine, Ser: 1 mg/dL (ref 0.61–1.24)
GFR, Estimated: >60 mL/min (ref >60)
Glucose, Bld: 124 mg/dL — ABNORMAL HIGH (ref 70–99)
Potassium: 3.7 mmol/L (ref 3.5–5.1)
Sodium: 135 mmol/L (ref 135–145)
Total Bilirubin: 0.7 mg/dL (ref 0.0–1.2)
Total Protein: 6 g/dL — ABNORMAL LOW (ref 6.5–8.1)

## 2024-02-23 MED ORDER — NITROGLYCERIN 2 % TD OINT: 1.0000 [in_us] | TOPICAL_OINTMENT | Freq: Once | TRANSDERMAL | Status: DC

## 2024-02-23 MED ORDER — NITROGLYCERIN 2 % TD OINT
0.5000 [in_us] | TOPICAL_OINTMENT | Freq: Once | TRANSDERMAL | Status: AC
  Administered 2024-02-23: 0.5 [in_us] via TOPICAL
  Filled 2024-02-23: qty 1

## 2024-02-23 NOTE — Progress Notes (Signed)
 Patient complaint of chest pain. I obtained an EKG, vitals, and administered a single 0.4 mg dose of nitroglycerin sublingual. Doctor Carollee Herter notified and advised to obtain another EKG. EKG performed. Doctor Carollee Herter notified and advised to apply Nitroglycerin topically in a half inch section to the chest. Nitroglycerin applied. Patient states he is feeling better after the nitroglycerin.

## 2024-02-23 NOTE — Progress Notes (Signed)
   Notified by RN that pt having CP. Has known CAD and hx of angina. On imdur and ranexa at home.  Had 1 SL NTG this AM that relieved CP. But then CP returned.  Will place 1/2 inch NTG ointment on his chest, check EKG and check serial troponins.  Carollee Herter, DO [name]

## 2024-02-23 NOTE — Progress Notes (Signed)
 PROGRESS NOTE    Dustin [name]  ZOX:[id number] DOB: November 14, 1934 DOA: 02/20/2024 PCP: Dustin Nevins, MD  Subjective: Pt seen and examined. Pt feeling better. Still wheezing. Pt requesting to go home. Pt states he has a electric scooter, rolling walker and cane at home. He doesn't use any of them on a regular basis. Has not been up walking yet.  Has home O2 with concentrator at home and home nebulizer machine.  Appetite is normal.   Hospital Course: HPI: Dustin [name] is a 89 y.o. male with medical history significant for past medical history   of Alzheimer's disease, carotid artery disease, coronary artery disease (Multivessel s/p CABG 1996. b. DESx2 to SVG-PDA in 2012 c. DES to distal LM in 11/2019 d. cath in 02/2021 showing patent LM stent, patent LIMA-LAD, patent SVG-OM1 with chronically occluded jump limb to OM2 and chronic occlusion of SVG-D2 and SVG-PDA and no targets for intervention), hypothyroidism, hypertension, OSA, hypothyroidism, HLD, depression, history of left lung radiation therapy, with chronic hypoxic respiratory failure but typically uses only 2 L of oxygen at night at a time as needed and BPH.. -- Presented to ED with complaints of cough, malaise, fatigue and myalgia as well as lethargy for the last 5 days or so -Patient is now requiring oxygen at the time -Patient's son who lives with him tested positive for flu--and pretty sick and quite hypoxic--patient's son was not vaccinated against the flu -Patient did not get his flu vaccine--he is also testing positive for influenza A, however he still requiring 2 L of oxygen despite his advanced age and comorbidities - No Nausea, Vomiting or Diarrhea -Apparently had fevers and chills at home Tmax here is 99.9 -In the ED chest x-ray with postradiation changes in left upper lobe, no new acute cardiopulmonary findings -Troponin is not elevated -COVID and RSV negative, influenza A+ -D-dimer adjusted for age is not elevated  and is close to prior baselines -WBC 6.1 hemoglobin is 11.7 which is close to prior platelets 167 -Sodium is 132, creatinine 1.1 bicarb 28 potassium 3.9 -EKG with no LBB, no new acute changes  Significant Events: Admitted 02/20/2024 for influenza, A, COPD exacerbation, acute on chronic respiratory failure with hypoxia   Significant Labs: WBC 6.1, Hg 11.7, Plt 167 Na 132, K 3.9, CO2 of 28, BUN 15, scr 1.13\ Influenza A POSITIVE  Significant Imaging Studies: CXR Suspected radiation changes in the left upper lobe. No acute cardiopulmonary disease.  Antibiotic Therapy: Anti-infectives (From admission, onward)    Start     Dose/Rate Route Frequency Ordered Stop   02/21/24 0900  oseltamivir (TAMIFLU) capsule 75 mg        75 mg Oral 2 times daily 02/21/24 8119 02/26/24 0959       Procedures:   Consultants:     Assessment and Plan: * Influenza A 02-22-2024 pt has had symptoms for about 2 weeks. Outside window for tamilflu to be effective. Stop tamiflu. Continue supportive care.  02-23-2024 continue supportive care.  COPD with acute exacerbation (HCC) 02-22-2024 change to po prednisone 40 mg daily. Stop IV solumedrol. Continue with nebs q6h.  02-23-2024 still wheezing some today. But better air exchange. Remains on 2 L/min O2, prednisone 40 mg daily, QID nebs. Pt has home O2 already, home nebulizer machine. Pt requesting discharge today. Discussed with him that he need PT evaluation to see if he stable to walk and be safe at home. Pt agrees to stay for PT evaluation today. If PT thinks he is stable  to go home, then pt can be discharged to home and he can f/u with PCP early next week.  Acute on chronic respiratory failure with hypoxia (HCC) - on nocturnal O2 @ 2 L/min. 02-22-2024 pt uses O2 2 L/min at night time. Using O2 continuously now. Due to COPD Exacerbation from influenza A infection. Pt does not have pneumonia.  02-23-2024 continue with supplemental O2.  Obesity, Class  I, BMI 30-34.9 Estimated body mass index is 32.78 kg/m as calculated from the following:   Height as of this encounter: 5\' 9"  (1.753 m).   Weight as of this encounter: 100.7 kg.   Benign prostatic hyperplasia with urinary obstruction 02-22-2024 stable. Chronic. On avodart, uroxatral.  02-23-2024 stable. Chronic.  Coronary artery disease 02-22-2024 stable. Chronic. On imdur and ranexa. On ASA and crestor.  02-23-2024 stable. Chronic.  HYPERTENSION, BENIGN 02-22-2024 stable. Chronic. Continue imdur. Holding lasix due to poor appetite.  02-23-2024 stable. Chronic.  Alzheimer disease (HCC) 02-22-2024 stable. Chronic. On namenda bid. Monitor for hospital-acquired delirium. Prn seroquel.  02-23-2024 stable. Chronic.  OSA (obstructive sleep apnea) 02-22-2024 stable. Chronic.  02-23-2024 stable. Chronic.  DVT prophylaxis: heparin injection 5,000 Units Start: 02/21/24 2200 SCDs Start: 02/21/24 0819 Place TED hose Start: 02/21/24 0819    Code Status: Full Code Family Communication: no family at bedside. Pt is decisional Disposition Plan: return home Reason for continuing need for hospitalization: possible stable for DC today. Needs PT evaluation to assess his safety at home.  Objective: Vitals:   02/23/24 0135 02/23/24 0410 02/23/24 0753 02/23/24 0756  BP:  (!) 150/75    Pulse:  (!) 55    Resp:  16    Temp:  97.6 F (36.4 C)    TempSrc:  Oral    SpO2: 96% 97% 92% 98%  Weight:      Height:        Intake/Output Summary (Last 24 hours) at 02/23/2024 0850 Last data filed at 02/23/2024 0542 Gross per 24 hour  Intake 960 ml  Output 2425 ml  Net -1465 ml   Filed Weights   02/21/24 1442  Weight: 100.7 kg    Examination:  Physical Exam Vitals and nursing note reviewed.  Constitutional:      General: He is not in acute distress.    Appearance: Normal appearance. He is not ill-appearing, toxic-appearing or diaphoretic.  HENT:     Head: Normocephalic and atraumatic.      Nose: Nose normal.  Eyes:     General: No scleral icterus. Cardiovascular:     Rate and Rhythm: Normal rate and regular rhythm.  Pulmonary:     Effort: Pulmonary effort is normal. No respiratory distress.     Comments: Much better air exchange today Just scattered wheezing today. No distress Abdominal:     General: Bowel sounds are normal.     Palpations: Abdomen is soft.  Musculoskeletal:     Right lower leg: No edema.     Left lower leg: No edema.  Skin:    General: Skin is warm and dry.     Capillary Refill: Capillary refill takes less than 2 seconds.  Neurological:     Mental Status: He is alert and oriented to person, place, and time.   Data Reviewed: I have personally reviewed following labs and imaging studies  CBC: Recent Labs  Lab 02/20/24 2308  WBC 6.1  HGB 11.7*  HCT 36.2*  MCV 96.0  PLT 167   Basic Metabolic Panel: Recent Labs  Lab 02/20/24  2308  NA 132*  K 3.9  CL 93*  CO2 28  GLUCOSE 142*  BUN 15  CREATININE 1.13  CALCIUM 8.9   GFR: Estimated Creatinine Clearance: 53.9 mL/min (by C-G formula based on SCr of 1.13 mg/dL).  BNP (last 3 results) Recent Labs    07/29/23 1927  BNP 126.0*   Recent Results (from the past 240 hours)  Resp panel by RT-PCR (RSV, Flu A&B, Covid) Anterior Nasal Swab     Status: Abnormal   Collection Time: 02/20/24 11:09 PM   Specimen: Anterior Nasal Swab  Result Value Ref Range Status   SARS Coronavirus 2 by RT PCR NEGATIVE NEGATIVE Final    Comment: (NOTE) SARS-CoV-2 target nucleic acids are NOT DETECTED.  The SARS-CoV-2 RNA is generally detectable in upper respiratory specimens during the acute phase of infection. The lowest concentration of SARS-CoV-2 viral copies this assay can detect is 138 copies/mL. A negative result does not preclude SARS-Cov-2 infection and should not be used as the sole basis for treatment or other patient management decisions. A negative result may occur with  improper specimen  collection/handling, submission of specimen other than nasopharyngeal swab, presence of viral mutation(s) within the areas targeted by this assay, and inadequate number of viral copies(<138 copies/mL). A negative result must be combined with clinical observations, patient history, and epidemiological information. The expected result is Negative.  Fact Sheet for Patients:  BloggerCourse.com  Fact Sheet for Healthcare Providers:  SeriousBroker.it  This test is no t yet approved or cleared by the [place] FDA and  has been authorized for detection and/or diagnosis of SARS-CoV-2 by FDA under an Emergency Use Authorization (EUA). This EUA will remain  in effect (meaning this test can be used) for the duration of the COVID-19 declaration under Section 564(b)(1) of the Act, 21 U.S.C.section 360bbb-3(b)(1), unless the authorization is terminated  or revoked sooner.       Influenza A by PCR POSITIVE (A) NEGATIVE Final   Influenza B by PCR NEGATIVE NEGATIVE Final    Comment: (NOTE) The Xpert Xpress SARS-CoV-2/FLU/RSV plus assay is intended as an aid in the diagnosis of influenza from Nasopharyngeal swab specimens and should not be used as a sole basis for treatment. Nasal washings and aspirates are unacceptable for Xpert Xpress SARS-CoV-2/FLU/RSV testing.  Fact Sheet for Patients: BloggerCourse.com  Fact Sheet for Healthcare Providers: SeriousBroker.it  This test is not yet approved or cleared by the [place] FDA and has been authorized for detection and/or diagnosis of SARS-CoV-2 by FDA under an Emergency Use Authorization (EUA). This EUA will remain in effect (meaning this test can be used) for the duration of the COVID-19 declaration under Section 564(b)(1) of the Act, 21 U.S.C. section 360bbb-3(b)(1), unless the authorization is terminated or revoked.     Resp Syncytial  Virus by PCR NEGATIVE NEGATIVE Final    Comment: (NOTE) Fact Sheet for Patients: BloggerCourse.com  Fact Sheet for Healthcare Providers: SeriousBroker.it  This test is not yet approved or cleared by the [place] FDA and has been authorized for detection and/or diagnosis of SARS-CoV-2 by FDA under an Emergency Use Authorization (EUA). This EUA will remain in effect (meaning this test can be used) for the duration of the COVID-19 declaration under Section 564(b)(1) of the Act, 21 U.S.C. section 360bbb-3(b)(1), unless the authorization is terminated or revoked.  Performed at [facility], [address] [place], [place] [address]     Scheduled Meds:  alfuzosin  10 mg Oral Q breakfast  aspirin EC  81 mg Oral Daily   budesonide (PULMICORT) nebulizer solution  0.25 mg Nebulization BID   clopidogrel  75 mg Oral Daily   DULoxetine  30 mg Oral Daily   dutasteride  0.5 mg Oral Daily   escitalopram  20 mg Oral q morning   gabapentin  100 mg Oral TID   heparin  5,000 Units Subcutaneous Q8H   ipratropium-albuterol  3 mL Nebulization Q6H   isosorbide mononitrate  120 mg Oral q morning   levothyroxine  75 mcg Oral Daily   memantine  10 mg Oral BID   pantoprazole  40 mg Oral Daily   predniSONE  40 mg Oral Q breakfast   ranolazine  500 mg Oral BID   rosuvastatin  40 mg Oral q1800   senna-docusate  2 tablet Oral QHS   sodium chloride flush  3 mL Intravenous Q12H   sodium chloride flush  3 mL Intravenous Q12H   Continuous Infusions:   LOS: 2 days   Time spent: 40 minutes  Carollee Herter, DO  [name]  02/23/2024, 8:50 AM

## 2024-02-23 NOTE — Evaluation (Signed)
 Physical Therapy Evaluation Patient Details Name: Dustin [name] MRN: [id number] DOB: 1934/04/22 Today's Date: 02/23/2024  History of Present Illness  LAJUAN KOVALESKI is a 89 y.o. male with medical history significant for past medical history   of Alzheimer's disease, carotid artery disease, coronary artery disease (Multivessel s/p CABG 1996. b. DESx2 to SVG-PDA in 2012 c. DES to distal LM in 11/2019 d. cath in 02/2021 showing patent LM stent, patent LIMA-LAD, patent SVG-OM1 with chronically occluded jump limb to OM2 and chronic occlusion of SVG-D2 and SVG-PDA and no targets for intervention), hypothyroidism, hypertension, OSA, hypothyroidism, HLD, depression, history of left lung radiation therapy, with chronic hypoxic respiratory failure but typically uses only 2 L of oxygen at night at a time as needed and BPH..   Clinical Impression  Patient was agreeable to therapy. Was supervision/CTG during session. RW was used for transfers and ambulation. RW provided stability during ambulation. Patient was able to get on and off the commode with supervision. Was left in chair at conclusion of session. Plan:  Patient discharged from physical therapy to care of nursing for ambulation daily as tolerated for length of stay.         If plan is discharge home, recommend the following: A little help with walking and/or transfers;Assistance with cooking/housework;A little help with bathing/dressing/bathroom;Help with stairs or ramp for entrance   Can travel by private vehicle        Equipment Recommendations None recommended by PT  Recommendations for Other Services       Functional Status Assessment Patient has had a recent decline in their functional status and demonstrates the ability to make significant improvements in function in a reasonable and predictable amount of time.     Precautions / Restrictions Precautions Precautions: None Restrictions Weight Bearing Restrictions Per Provider Order:  No      Mobility  Bed Mobility Overal bed mobility: Needs Assistance Bed Mobility: Supine to Sit     Supine to sit: Supervision, Contact guard       Patient Response: Cooperative  Transfers Overall transfer level: Needs assistance Equipment used: Rolling walker (2 wheels) Transfers: Sit to/from Stand, Bed to chair/wheelchair/BSC Sit to Stand: Supervision, Contact guard assist   Step pivot transfers: Supervision, Contact guard assist            Ambulation/Gait Ambulation/Gait assistance: Supervision, Contact guard assist Gait Distance (Feet): 100 Feet Assistive device: Rolling walker (2 wheels) Gait Pattern/deviations: WFL(Within Functional Limits) Gait velocity: decreased     General Gait Details: use RW for stability during ambulation  Stairs            Wheelchair Mobility     Tilt Bed Tilt Bed Patient Response: Cooperative  Modified Rankin (Stroke Patients Only)       Balance Overall balance assessment: Needs assistance Sitting-balance support: Feet supported, Bilateral upper extremity supported Sitting balance-Leahy Scale: Good Sitting balance - Comments: EOB   Standing balance support: Bilateral upper extremity supported, During functional activity, Reliant on assistive device for balance Standing balance-Leahy Scale: Fair Standing balance comment: fair/good with RW                             Pertinent Vitals/Pain Pain Assessment Pain Assessment: No/denies pain    Home Living Family/patient expects to be discharged to:: Private residence Living Arrangements: Spouse/significant other;Children Available Help at Discharge: Family;Available PRN/intermittently Type of Home: House Home Access: Level entry       Home  Layout: One level Home Equipment: Agricultural consultant (2 wheels);Rollator (4 wheels);Cane - single point Additional Comments: granddaughters are Charity fundraiser and provide good family support    Prior Function Prior Level of  Function : Needs assist       Physical Assist : Mobility (physical) Mobility (physical): Bed mobility;Transfers;Gait   Mobility Comments: Rollator for longer distances ADLs Comments: assisted by family     Extremity/Trunk Assessment   Upper Extremity Assessment Upper Extremity Assessment: Defer to OT evaluation    Lower Extremity Assessment Lower Extremity Assessment: Generalized weakness    Cervical / Trunk Assessment Cervical / Trunk Assessment: Normal  Communication   Communication Communication: No apparent difficulties    Cognition Arousal: Alert Behavior During Therapy: WFL for tasks assessed/performed   PT - Cognitive impairments: No apparent impairments                         Following commands: Intact       Cueing Cueing Techniques: Verbal cues, Tactile cues     General Comments      Exercises     Assessment/Plan    PT Assessment All further PT needs can be met in the next venue of care  PT Problem List Decreased strength;Decreased activity tolerance;Decreased balance;Decreased mobility       PT Treatment Interventions      PT Goals (Current goals can be found in the Care Plan section)  Acute Rehab PT Goals Patient Stated Goal: to return home PT Goal Formulation: With patient Time For Goal Achievement: 03/01/24 Potential to Achieve Goals: Good    Frequency       Co-evaluation               AM-PAC PT "6 Clicks" Mobility  Outcome Measure Help needed turning from your back to your side while in a flat bed without using bedrails?: A Little Help needed moving from lying on your back to sitting on the side of a flat bed without using bedrails?: A Little Help needed moving to and from a bed to a chair (including a wheelchair)?: A Little Help needed standing up from a chair using your arms (e.g., wheelchair or bedside chair)?: A Little Help needed to walk in hospital room?: A Little Help needed climbing 3-5 steps with a  railing? : A Lot 6 Click Score: 17    End of Session   Activity Tolerance: Patient tolerated treatment well Patient left: in chair;with call bell/phone within reach;with chair alarm set Nurse Communication: Mobility status PT Visit Diagnosis: Unsteadiness on feet (R26.81);Other abnormalities of gait and mobility (R26.89);Muscle weakness (generalized) (M62.81)    Time: 8657-8469 PT Time Calculation (min) (ACUTE ONLY): 18 min   Charges:   PT Evaluation $PT Eval Low Complexity: 1 Low PT Treatments $Therapeutic Activity: 8-22 mins PT General Charges $$ ACUTE PT VISIT: 1 Visit         Wrigley Winborne SPT

## 2024-02-23 NOTE — TOC Transition Note (Signed)
 Transition of Care [facility]) - Discharge Note   Patient Details  Name: Dustin [name] MRN: [id number] Date of Birth: 1934-04-26  Transition of Care [facility]) CM/SW Contact:  Isabella Bowens, LCSWA Phone Number: 02/23/2024, 1:52 PM   Clinical Narrative:    Patient is scheduled to DC today , per MD. CSW spoke with pt about PT recommendation for HHPT and pt advised CSW to call his wife. CSW spoke with spouse mildred and PT recommendation . Spouse was in agreement with [facility] providing HHPT. CSW reached out to [facility] and he confirmed being able to accept referral .    Final next level of care: Home w Home Health Services Barriers to Discharge: Barriers Resolved   Patient Goals and CMS Choice Patient states their goals for this hospitalization and ongoing recovery are:: return back home CMS Medicare.gov Compare Post Acute Care list provided to:: Patient Represenative (must comment) (Spouse - mildred) Choice offered to / list presented to : Spouse      Discharge Placement             Return back home      Name of family member notified: Rhunette Croft Patient and family notified of of transfer: 02/23/24  Discharge Plan and Services Additional resources added to the After Visit Summary for                  DME Arranged: N/A         HH Arranged: PT HH Agency: [facility] Date [facility] Agency Contacted: 02/23/24 Time HH Agency Contacted: 1352 Representative spoke with at [facility] Agency: Kandee Keen  Social Drivers of Health (SDOH) Interventions SDOH Screenings   Food Insecurity: No Food Insecurity (02/21/2024)  Housing: Low Risk  (02/21/2024)  Transportation Needs: No Transportation Needs (02/21/2024)  Utilities: Not At Risk (02/21/2024)  Depression (PHQ2-9): Low Risk  (04/05/2023)  Social Connections: Moderately Isolated (02/21/2024)  Tobacco Use: Medium Risk (02/20/2024)     Readmission Risk Interventions    02/23/2024    1:51 PM 02/22/2024   12:43 PM 07/30/2023   11:01 AM  Readmission  Risk Prevention Plan  Transportation Screening Complete Complete Complete  PCP or Specialist Appt within 3-5 Days  Not Complete   HRI or Home Care Consult Complete Complete   Social Work Consult for Recovery Care Planning/Counseling Complete    Palliative Care Screening Not Applicable Not Applicable   Medication Review Oceanographer) Complete Complete Complete  PCP or Specialist appointment within 3-5 days of discharge   Complete  HRI or Home Care Consult   Complete  SW Recovery Care/Counseling Consult   Complete  Palliative Care Screening   Not Applicable  Skilled Nursing Facility   Not Applicable

## 2024-02-23 NOTE — Plan of Care (Signed)
  Problem: Education: Goal: Knowledge of General Education information will improve Description: Including pain rating scale, medication(s)/side effects and non-pharmacologic comfort measures Outcome: Adequate for Discharge   Problem: Health Behavior/Discharge Planning: Goal: Ability to manage health-related needs will improve Outcome: Progressing   Problem: Clinical Measurements: Goal: Ability to maintain clinical measurements within normal limits will improve Outcome: Progressing Goal: Will remain free from infection Outcome: Progressing Goal: Diagnostic test results will improve Outcome: Progressing Goal: Respiratory complications will improve Outcome: Progressing Goal: Cardiovascular complication will be avoided Outcome: Progressing   Problem: Activity: Goal: Risk for activity intolerance will decrease Outcome: Progressing   Problem: Nutrition: Goal: Adequate nutrition will be maintained Outcome: Adequate for Discharge   Problem: Coping: Goal: Level of anxiety will decrease Outcome: Adequate for Discharge   Problem: Elimination: Goal: Will not experience complications related to bowel motility Outcome: Progressing Goal: Will not experience complications related to urinary retention Outcome: Progressing   Problem: Pain Managment: Goal: General experience of comfort will improve and/or be controlled Outcome: Progressing   Problem: Safety: Goal: Ability to remain free from injury will improve Outcome: Progressing   Problem: Skin Integrity: Goal: Risk for impaired skin integrity will decrease Outcome: Progressing

## 2024-02-24 DIAGNOSIS — N401 Enlarged prostate with lower urinary tract symptoms: Secondary | ICD-10-CM | POA: Diagnosis not present

## 2024-02-24 DIAGNOSIS — J9621 Acute and chronic respiratory failure with hypoxia: Secondary | ICD-10-CM | POA: Diagnosis not present

## 2024-02-24 DIAGNOSIS — J441 Chronic obstructive pulmonary disease with (acute) exacerbation: Secondary | ICD-10-CM | POA: Diagnosis not present

## 2024-02-24 DIAGNOSIS — J101 Influenza due to other identified influenza virus with other respiratory manifestations: Secondary | ICD-10-CM | POA: Diagnosis not present

## 2024-02-24 LAB — CBC WITH DIFFERENTIAL/PLATELET
Abs Immature Granulocytes: 0.05 10*3/uL (ref 0.00–0.07)
Basophils Absolute: 0 10*3/uL (ref 0.0–0.1)
Basophils Relative: 0 %
Eosinophils Absolute: 0 10*3/uL (ref 0.0–0.5)
Eosinophils Relative: 0 %
HCT: 37.4 % — ABNORMAL LOW (ref 39.0–52.0)
Hemoglobin: 11.8 g/dL — ABNORMAL LOW (ref 13.0–17.0)
Immature Granulocytes: 1 %
Lymphocytes Relative: 17 %
Lymphs Abs: 1.8 10*3/uL (ref 0.7–4.0)
MCH: 30.6 pg (ref 26.0–34.0)
MCHC: 31.6 g/dL (ref 30.0–36.0)
MCV: 96.9 fL (ref 80.0–100.0)
Monocytes Absolute: 1.1 10*3/uL — ABNORMAL HIGH (ref 0.1–1.0)
Monocytes Relative: 11 %
Neutro Abs: 7.6 10*3/uL (ref 1.7–7.7)
Neutrophils Relative %: 71 %
Platelets: 172 10*3/uL (ref 150–400)
RBC: 3.86 MIL/uL — ABNORMAL LOW (ref 4.22–5.81)
RDW: 13.6 % (ref 11.5–15.5)
WBC: 10.6 10*3/uL — ABNORMAL HIGH (ref 4.0–10.5)
nRBC: 0 % (ref 0.0–0.2)

## 2024-02-24 LAB — COMPREHENSIVE METABOLIC PANEL
ALT: 17 U/L (ref 0–44)
AST: 22 U/L (ref 15–41)
Albumin: 3.2 g/dL — ABNORMAL LOW (ref 3.5–5.0)
Alkaline Phosphatase: 40 U/L (ref 38–126)
Anion gap: 5 (ref 5–15)
BUN: 23 mg/dL (ref 8–23)
CO2: 31 mmol/L (ref 22–32)
Calcium: 8.8 mg/dL — ABNORMAL LOW (ref 8.9–10.3)
Chloride: 100 mmol/L (ref 98–111)
Creatinine, Ser: 1.04 mg/dL (ref 0.61–1.24)
GFR, Estimated: >60 mL/min (ref >60)
Glucose, Bld: 115 mg/dL — ABNORMAL HIGH (ref 70–99)
Potassium: 3.6 mmol/L (ref 3.5–5.1)
Sodium: 136 mmol/L (ref 135–145)
Total Bilirubin: 0.9 mg/dL (ref 0.0–1.2)
Total Protein: 6.3 g/dL — ABNORMAL LOW (ref 6.5–8.1)

## 2024-02-24 MED ORDER — ROSUVASTATIN CALCIUM 40 MG PO TABS: 40.0000 mg | ORAL_TABLET | Freq: Every day | ORAL | 5 refills | Status: DC

## 2024-02-24 MED ORDER — PREDNISONE 20 MG PO TABS: 40.0000 mg | ORAL_TABLET | Freq: Every day | ORAL | 0 refills | Status: AC

## 2024-02-24 MED ORDER — ASPIRIN 81 MG PO TBEC: 81.0000 mg | DELAYED_RELEASE_TABLET | Freq: Every day | ORAL | Status: DC

## 2024-02-24 NOTE — Consult Note (Signed)
 [facility] Liaison Note  02/24/2024  Dustin [name] 08-16-1934 [id number]  Location: RN Hospital Liaison screened the patient remotely at [facility].  Insurance: [employer] HMO   Dustin [name] is a 89 y.o. male who is a Primary Care Patient of [name], MD -[facility].The patient was screened for  readmission hospitalization with noted high risk score for unplanned readmission risk with 1 IP in 6 months.  The patient was assessed for potential Care Management service needs for post hospital transition for care coordination. Review of patient's electronic medical record reveals patient was admitted for Influenza. Currently provider does not participate with VBCI services.    VBCI Care Management/Population Health does not replace or interfere with any arrangements made by the Inpatient Transition of Care team.   For questions contact:   Elliot Cousin, RN, BSN Hospital Liaison [place]   [facility], Population Health Office Hours MTWF  8:00 am-6:00 pm Direct Dial: [phone] mobile [email or web address]

## 2024-02-24 NOTE — Discharge Summary (Signed)
 [name] Physician Discharge Summary   Patient name: Dustin [name]  Admit date:     02/20/2024  Discharge date: 02/24/2024  Attending Physician: Frankey Shown [[id number]]  Discharge Physician: Carollee Herter   PCP: [name], MD  Admitted From: Home  Disposition:  Home  Recommendations for Outpatient Follow-up:  Follow up with PCP in 1-2 weeks F/U with cardiology Dr. Diona Browner. Appointment will be made for patient  Home Health:Yes. Home PT Equipment/Devices: Oxygen 2 L/min. Chronic. Has home concentrator and portable O2  Discharge Condition:Stable CODE STATUS:FULL Diet recommendation: Heart Healthy Fluid Restriction: None  Hospital Summary: HPI: Dustin [name] is a 89 y.o. male with medical history significant for past medical history   of Alzheimer's disease, carotid artery disease, coronary artery disease (Multivessel s/p CABG 1996. b. DESx2 to SVG-PDA in 2012 c. DES to distal LM in 11/2019 d. cath in 02/2021 showing patent LM stent, patent LIMA-LAD, patent SVG-OM1 with chronically occluded jump limb to OM2 and chronic occlusion of SVG-D2 and SVG-PDA and no targets for intervention), hypothyroidism, hypertension, OSA, hypothyroidism, HLD, depression, history of left lung radiation therapy, with chronic hypoxic respiratory failure but typically uses only 2 L of oxygen at night at a time as needed and BPH.. -- Presented to ED with complaints of cough, malaise, fatigue and myalgia as well as lethargy for the last 5 days or so -Patient is now requiring oxygen at the time -Patient's son who lives with him tested positive for flu--and pretty sick and quite hypoxic--patient's son was not vaccinated against the flu -Patient did not get his flu vaccine--he is also testing positive for influenza A, however he still requiring 2 L of oxygen despite his advanced age and comorbidities - No Nausea, Vomiting or Diarrhea -Apparently had fevers and chills at home Tmax here is  99.9 -In the ED chest x-ray with postradiation changes in left upper lobe, no new acute cardiopulmonary findings -Troponin is not elevated -COVID and RSV negative, influenza A+ -D-dimer adjusted for age is not elevated and is close to prior baselines -WBC 6.1 hemoglobin is 11.7 which is close to prior platelets 167 -Sodium is 132, creatinine 1.1 bicarb 28 potassium 3.9 -EKG with no LBB, no new acute changes  Significant Events: Admitted 02/20/2024 for influenza, A, COPD exacerbation, acute on chronic respiratory failure with hypoxia   Significant Labs: WBC 6.1, Hg 11.7, Plt 167 Na 132, K 3.9, CO2 of 28, BUN 15, scr 1.13\ Influenza A POSITIVE  Significant Imaging Studies: CXR Suspected radiation changes in the left upper lobe. No acute cardiopulmonary disease.  Antibiotic Therapy: Anti-infectives (From admission, onward)    Start     Dose/Rate Route Frequency Ordered Stop   02/21/24 0900  oseltamivir (TAMIFLU) capsule 75 mg        75 mg Oral 2 times daily 02/21/24 4540 02/26/24 0959       Procedures:   Consultants:    Hospital Course by Problem: * Influenza A 02-22-2024 pt has had symptoms for about 2 weeks. Outside window for tamilflu to be effective. Stop tamiflu. Continue supportive care. 02-23-2024 continue supportive care.  02-24-2024 DC to home.  COPD with acute exacerbation (HCC) 02-22-2024 change to po prednisone 40 mg daily. Stop IV solumedrol. Continue with nebs q6h. 02-23-2024 still wheezing some today. But better air exchange. Remains on 2 L/min O2, prednisone 40 mg daily, QID nebs. Pt has home O2 already, home nebulizer machine. Pt requesting discharge today. Discussed with him that he need PT evaluation to see  if he stable to walk and be safe at home. Pt agrees to stay for PT evaluation today. If PT thinks he is stable to go home, then pt can be discharged to home and he can f/u with PCP early next week.  02-24-2024 DC to home. Pt has home nebulizer  machine and albuterol neb medication. DC with prednisone over next 6 days.  Acute on chronic respiratory failure with hypoxia (HCC) - on nocturnal O2 @ 2 L/min. 02-22-2024 pt uses O2 2 L/min at night time. Using O2 continuously now. Due to COPD Exacerbation from influenza A infection. Pt does not have pneumonia. 02-23-2024 continue with supplemental O2.  02-24-2024 pt has home O2 2 L/min. Usually wears at night.  Obesity, Class I, BMI 30-34.9 Estimated body mass index is 32.78 kg/m as calculated from the following:   Height as of this encounter: 5\' 9"  (1.753 m).   Weight as of this encounter: 100.7 kg.   Benign prostatic hyperplasia with urinary obstruction 02-22-2024 stable. Chronic. On avodart, uroxatral. 02-23-2024 stable. Chronic. 02-24-2024 stable. Chronic. DC to home.  Coronary artery disease - not on betablocker due to baseline bradycardia. Documented by cardiology 02-22-2024 stable. Chronic. On imdur and ranexa. On ASA and crestor. 02-23-2024 stable. Chronic.  02-24-2024 pt not on betablocker due to baseline bradycardia. Documented by cardiology. Refer back to Dr. Diona Browner to be seen. Only wants to see physician. Continue ranexa and imdur. Prn SL NTG. Continue ASA and crestor  HYPERTENSION, BENIGN 02-22-2024 stable. Chronic. Continue imdur. Holding lasix due to poor appetite. 02-23-2024 stable. Chronic.  02-24-2024 stable. Hold lasix for 1 week. Then restart.  Alzheimer disease (HCC) 02-22-2024 stable. Chronic. On namenda bid. Monitor for hospital-acquired delirium. Prn seroquel. 02-23-2024 stable. Chronic.  02-24-2024 stable. DC to home  OSA (obstructive sleep apnea) 02-22-2024 stable. Chronic. 02-23-2024 stable. Chronic.  02-24-2024 stable. DC to home    Discharge Diagnoses:  Principal Problem:   Influenza A Active Problems:   COPD with acute exacerbation (HCC)   Acute on chronic respiratory failure with hypoxia (HCC) - on nocturnal O2 @ 2 L/min.   OSA  (obstructive sleep apnea)   Alzheimer disease (HCC)   HYPERTENSION, BENIGN   Coronary artery disease - not on betablocker due to baseline bradycardia. Documented by cardiology   Benign prostatic hyperplasia with urinary obstruction   Obesity, Class I, BMI 30-34.9   Discharge Instructions  Discharge Instructions     Ambulatory referral to Cardiology   Complete by: As directed    Only wants to see Dr. Diona Browner. F/u with CAD with stable angina.   Call MD for:  difficulty breathing, headache or visual disturbances   Complete by: As directed    Call MD for:  extreme fatigue   Complete by: As directed    Call MD for:  hives   Complete by: As directed    Call MD for:  persistant dizziness or light-headedness   Complete by: As directed    Call MD for:  persistant nausea and vomiting   Complete by: As directed    Call MD for:  redness, tenderness, or signs of infection (pain, swelling, redness, odor or green/yellow discharge around incision site)   Complete by: As directed    Call MD for:  severe uncontrolled pain   Complete by: As directed    Call MD for:  temperature >100.4   Complete by: As directed    Diet - low sodium heart healthy   Complete by: As directed  Discharge instructions   Complete by: As directed    1. Follow up with your primary care provider in 1-2 week after discharge from hospital. 2. Hold taking lasix and potassium until next week. May resume next week when your appetite returns to normal. 3. Outpatient appointment will be called to you so you can see Dr. Diona Browner in cardiology.   Increase activity slowly   Complete by: As directed       Allergies as of 02/24/2024   No Known Allergies      Medication List     PAUSE taking these medications    furosemide 20 MG tablet Wait to take this until: March 01, 2024 Resume when appetite returns to normal next week. Commonly known as: LASIX Take 1 tablet (20 mg total) by mouth 2 (two) times daily. For fluid    potassium chloride SA 20 MEQ tablet Wait to take this until: March 01, 2024 Resume when appetite returns to normal next week. Commonly known as: KLOR-CON M Take 1 tablet (20 mEq total) by mouth daily.       TAKE these medications    albuterol (2.5 MG/3ML) 0.083% nebulizer solution Commonly known as: PROVENTIL Take 3 mLs (2.5 mg total) by nebulization every 2 (two) hours as needed for wheezing or shortness of breath.   albuterol 108 (90 Base) MCG/ACT inhaler Commonly known as: VENTOLIN HFA Inhale 2 puffs into the lungs every 6 (six) hours as needed for wheezing or shortness of breath.   alfuzosin 10 MG 24 hr tablet Commonly known as: UROXATRAL TAKE 1 TABLET DAILY WITH BREAKFAST.   aspirin EC 81 MG tablet Take 1 tablet (81 mg total) by mouth daily. Swallow whole.   Breztri Aerosphere 160-9-4.8 MCG/ACT Aero Generic drug: Budeson-Glycopyrrol-Formoterol Inhale 2 puffs into the lungs in the morning and at bedtime.   DULoxetine 30 MG capsule Commonly known as: CYMBALTA Take 30 mg by mouth daily.   escitalopram 20 MG tablet Commonly known as: LEXAPRO Take 20 mg by mouth every morning.   gabapentin 100 MG capsule Commonly known as: NEURONTIN Take 100 mg by mouth 3 (three) times daily.   isosorbide mononitrate 120 MG 24 hr tablet Commonly known as: IMDUR Take 1 tablet (120 mg total) by mouth every morning.   levothyroxine 75 MCG tablet Commonly known as: SYNTHROID Take 75 mcg by mouth daily.   meclizine 25 MG tablet Commonly known as: ANTIVERT Take 25 mg by mouth 4 (four) times daily as needed for dizziness.   memantine 10 MG tablet Commonly known as: NAMENDA Take 10 mg by mouth 2 (two) times daily.   nitroGLYCERIN 0.4 MG SL tablet Commonly known as: NITROSTAT Place 1 tablet (0.4 mg total) under the tongue every 5 (five) minutes as needed for chest pain.   oxycodone 30 MG immediate release tablet Commonly known as: ROXICODONE Take 30 mg by mouth every 6 (six)  hours as needed for pain.   pantoprazole 40 MG tablet Commonly known as: Protonix Take 1 tablet (40 mg total) by mouth 2 (two) times daily for 14 days, THEN 1 tablet (40 mg total) daily. Start taking on: February 14, 2023   predniSONE 20 MG tablet Commonly known as: DELTASONE Take 2 tablets (40 mg total) by mouth daily with breakfast for 6 days.   promethazine 25 MG tablet Commonly known as: PHENERGAN Take 25 mg by mouth 4 (four) times daily as needed for nausea or vomiting.   ranolazine 500 MG 12 hr tablet Commonly known as: RANEXA  Take 500 mg by mouth 2 (two) times daily.   risperiDONE 0.25 MG tablet Commonly known as: RISPERDAL Take 0.25 mg by mouth at bedtime.   rosuvastatin 40 MG tablet Commonly known as: CRESTOR Take 1 tablet (40 mg total) by mouth daily at 6 PM.       No Known Allergies  Discharge Exam: Vitals:   02/24/24 0756 02/24/24 0759  BP:    Pulse:    Resp:    Temp:    SpO2: 92% 97%   Physical Exam Vitals and nursing note reviewed.  Constitutional:      General: He is not in acute distress.    Appearance: He is not toxic-appearing or diaphoretic.  HENT:     Head: Normocephalic and atraumatic.     Nose: Nose normal.  Eyes:     General: No scleral icterus. Cardiovascular:     Rate and Rhythm: Normal rate and regular rhythm.     Pulses: Normal pulses.  Pulmonary:     Effort: Pulmonary effort is normal. No respiratory distress.     Breath sounds: Normal breath sounds.     Comments: Faint wheeze today. No distress. Good air exchange. Abdominal:     General: Bowel sounds are normal. There is no distension.     Palpations: Abdomen is soft.  Musculoskeletal:     Right lower leg: No edema.     Left lower leg: No edema.  Skin:    General: Skin is warm and dry.     Capillary Refill: Capillary refill takes less than 2 seconds.  Neurological:     General: No focal deficit present.     Mental Status: He is alert and oriented to person, place, and  time.    The results of significant diagnostics from this hospitalization (including imaging, microbiology, ancillary and laboratory) are listed below for reference.    Microbiology: Recent Results (from the past 240 hours)  Resp panel by RT-PCR (RSV, Flu A&B, Covid) Anterior Nasal Swab     Status: Abnormal   Collection Time: 02/20/24 11:09 PM   Specimen: Anterior Nasal Swab  Result Value Ref Range Status   SARS Coronavirus 2 by RT PCR NEGATIVE NEGATIVE Final    Comment: (NOTE) SARS-CoV-2 target nucleic acids are NOT DETECTED.  The SARS-CoV-2 RNA is generally detectable in upper respiratory specimens during the acute phase of infection. The lowest concentration of SARS-CoV-2 viral copies this assay can detect is 138 copies/mL. A negative result does not preclude SARS-Cov-2 infection and should not be used as the sole basis for treatment or other patient management decisions. A negative result may occur with  improper specimen collection/handling, submission of specimen other than nasopharyngeal swab, presence of viral mutation(s) within the areas targeted by this assay, and inadequate number of viral copies(<138 copies/mL). A negative result must be combined with clinical observations, patient history, and epidemiological information. The expected result is Negative.  Fact Sheet for Patients:  BloggerCourse.com  Fact Sheet for Healthcare Providers:  SeriousBroker.it  This test is no t yet approved or cleared by the [place] FDA and  has been authorized for detection and/or diagnosis of SARS-CoV-2 by FDA under an Emergency Use Authorization (EUA). This EUA will remain  in effect (meaning this test can be used) for the duration of the COVID-19 declaration under Section 564(b)(1) of the Act, 21 U.S.C.section 360bbb-3(b)(1), unless the authorization is terminated  or revoked sooner.       Influenza A by PCR POSITIVE (A)  NEGATIVE  Final   Influenza B by PCR NEGATIVE NEGATIVE Final    Comment: (NOTE) The Xpert Xpress SARS-CoV-2/FLU/RSV plus assay is intended as an aid in the diagnosis of influenza from Nasopharyngeal swab specimens and should not be used as a sole basis for treatment. Nasal washings and aspirates are unacceptable for Xpert Xpress SARS-CoV-2/FLU/RSV testing.  Fact Sheet for Patients: BloggerCourse.com  Fact Sheet for Healthcare Providers: SeriousBroker.it  This test is not yet approved or cleared by the [place] FDA and has been authorized for detection and/or diagnosis of SARS-CoV-2 by FDA under an Emergency Use Authorization (EUA). This EUA will remain in effect (meaning this test can be used) for the duration of the COVID-19 declaration under Section 564(b)(1) of the Act, 21 U.S.C. section 360bbb-3(b)(1), unless the authorization is terminated or revoked.     Resp Syncytial Virus by PCR NEGATIVE NEGATIVE Final    Comment: (NOTE) Fact Sheet for Patients: BloggerCourse.com  Fact Sheet for Healthcare Providers: SeriousBroker.it  This test is not yet approved or cleared by the [place] FDA and has been authorized for detection and/or diagnosis of SARS-CoV-2 by FDA under an Emergency Use Authorization (EUA). This EUA will remain in effect (meaning this test can be used) for the duration of the COVID-19 declaration under Section 564(b)(1) of the Act, 21 U.S.C. section 360bbb-3(b)(1), unless the authorization is terminated or revoked.  Performed at [facility], [address], [place], [place] [address]     Labs: BNP (last 3 results) Recent Labs    07/29/23 1927  BNP 126.0*   Basic Metabolic Panel: Recent Labs  Lab 02/20/24 2308 02/23/24 1117 02/24/24 0419  NA 132* 135 136  K 3.9 3.7 3.6  CL 93* 95* 100  CO2 28 30 31   GLUCOSE 142* 124* 115*  BUN 15 27* 23   CREATININE 1.13 1.00 1.04  CALCIUM 8.9 8.9 8.8*   Liver Function Tests: Recent Labs  Lab 02/23/24 1117 02/24/24 0419  AST 28 22  ALT 19 17  ALKPHOS 43 40  BILITOT 0.7 0.9  PROT 6.0* 6.3*  ALBUMIN 3.2* 3.2*   CBC: Recent Labs  Lab 02/20/24 2308 02/23/24 1117 02/24/24 0419  WBC 6.1 13.1* 10.6*  NEUTROABS  --  11.4* 7.6  HGB 11.7* 12.1* 11.8*  HCT 36.2* 37.9* 37.4*  MCV 96.0 95.2 96.9  PLT 167 190 172   Sepsis Labs Recent Labs  Lab 02/20/24 2308 02/23/24 1117 02/24/24 0419  WBC 6.1 13.1* 10.6*   Procedures/Studies: DG Chest Portable 1 View Result Date: 02/21/2024 CLINICAL DATA:  Chest pain, altered mental status EXAM: PORTABLE CHEST 1 VIEW COMPARISON:  CT chest dated 09/20/2023 FINDINGS: Focal patchy nodular opacity in the left upper lobe, chronic. When correlating with prior CT, this may reflect radiation changes. Mild left basilar scarring with volume loss. Right lung is clear. No pleural effusion or pneumothorax. The heart is normal in size Postsurgical changes related to prior CABG. Thoracic aortic atherosclerosis. Median sternotomy. IMPRESSION: Suspected radiation changes in the left upper lobe. No acute cardiopulmonary disease. Electronically Signed   By: Charline Bills M.D.   On: 02/21/2024 00:17    Time coordinating discharge: 45 mins  SIGNED:  Carollee Herter, DO [name] 02/24/24, 8:37 AM

## 2024-02-24 NOTE — Plan of Care (Signed)
  Problem: Education: Goal: Knowledge of General Education information will improve Description: Including pain rating scale, medication(s)/side effects and non-pharmacologic comfort measures Outcome: Progressing   Problem: Health Behavior/Discharge Planning: Goal: Ability to manage health-related needs will improve Outcome: Progressing   Problem: Clinical Measurements: Goal: Ability to maintain clinical measurements within normal limits will improve Outcome: Progressing Goal: Will remain free from infection Outcome: Progressing Goal: Diagnostic test results will improve Outcome: Progressing Goal: Respiratory complications will improve Outcome: Progressing Goal: Cardiovascular complication will be avoided Outcome: Progressing   Problem: Activity: Goal: Risk for activity intolerance will decrease Outcome: Progressing   Problem: Nutrition: Goal: Adequate nutrition will be maintained Outcome: Adequate for Discharge   Problem: Coping: Goal: Level of anxiety will decrease Outcome: Progressing   Problem: Elimination: Goal: Will not experience complications related to bowel motility Outcome: Progressing Goal: Will not experience complications related to urinary retention Outcome: Adequate for Discharge   Problem: Pain Managment: Goal: General experience of comfort will improve and/or be controlled Outcome: Progressing   Problem: Safety: Goal: Ability to remain free from injury will improve Outcome: Progressing   Problem: Skin Integrity: Goal: Risk for impaired skin integrity will decrease Outcome: Progressing

## 2024-02-24 NOTE — TOC Transition Note (Signed)
 Transition of Care [facility]) - Discharge Note   Patient Details  Name: Dustin [name] MRN: [id number] Date of Birth: 05-26-1934  Transition of Care [facility]) CM/SW Contact:  Beather Arbour Phone Number: 02/24/2024, 9:04 AM   Clinical Narrative:    Patient is scheduled to DC today , per MD. CSW spoke with pt about PT recommendation for HHPT and pt advised CSW to call his wife. CSW spoke with spouse mildred and PT recommendation . Spouse was in agreement with [facility] providing HHPT. CSW reached out to [facility] and he confirmed being able to accept referral .   Patient did not DC yesterday , MD wanted to keep him another day. TOC signing off    Final next level of care: Home w Home Health Services Barriers to Discharge: Barriers Resolved   Patient Goals and CMS Choice Patient states their goals for this hospitalization and ongoing recovery are:: return back home CMS Medicare.gov Compare Post Acute Care list provided to:: Patient Represenative (must comment) (Spouse - mildred) Choice offered to / list presented to : Spouse      Discharge Placement             Return back home      Name of family member notified: Rhunette Croft Patient and family notified of of transfer: 02/23/24  Discharge Plan and Services Additional resources added to the After Visit Summary for                  DME Arranged: N/A         HH Arranged: PT HH Agency: [facility] Date [facility] Agency Contacted: 02/23/24 Time HH Agency Contacted: 1352 Representative spoke with at [facility] Agency: Kandee Keen  Social Drivers of Health (SDOH) Interventions SDOH Screenings   Food Insecurity: No Food Insecurity (02/21/2024)  Housing: Low Risk  (02/21/2024)  Transportation Needs: No Transportation Needs (02/21/2024)  Utilities: Not At Risk (02/21/2024)  Depression (PHQ2-9): Low Risk  (04/05/2023)  Social Connections: Moderately Isolated (02/21/2024)  Tobacco Use: Medium Risk (02/20/2024)     Readmission Risk  Interventions    02/23/2024    1:51 PM 02/22/2024   12:43 PM 07/30/2023   11:01 AM  Readmission Risk Prevention Plan  Transportation Screening Complete Complete Complete  PCP or Specialist Appt within 3-5 Days  Not Complete   HRI or Home Care Consult Complete Complete   Social Work Consult for Recovery Care Planning/Counseling Complete    Palliative Care Screening Not Applicable Not Applicable   Medication Review Oceanographer) Complete Complete Complete  PCP or Specialist appointment within 3-5 days of discharge   Complete  HRI or Home Care Consult   Complete  SW Recovery Care/Counseling Consult   Complete  Palliative Care Screening   Not Applicable  Skilled Nursing Facility   Not Applicable

## 2024-02-24 NOTE — Progress Notes (Signed)
 PROGRESS NOTE    JAKALEB PAYER  ZOX:[id number] DOB: 08/24/1934 DOA: 02/20/2024 PCP: [name], MD  Subjective: Pt seen and examined. Pt had angina yesterday requiring topical NTG. Pt uses SL NTG every week. Goes through a whole bottle in 1 month. Pt wants to see Dr. Diona Browner again. Last cards appointment in 01/2023. Saw NP but was not happy with care.  Wants to see physician in clinic.  Wants to go home.  Able to walk with PT. Home health PT ordered.   Hospital Course: HPI: BLAYN WHETSELL is a 89 y.o. male with medical history significant for past medical history   of Alzheimer's disease, carotid artery disease, coronary artery disease (Multivessel s/p CABG 1996. b. DESx2 to SVG-PDA in 2012 c. DES to distal LM in 11/2019 d. cath in 02/2021 showing patent LM stent, patent LIMA-LAD, patent SVG-OM1 with chronically occluded jump limb to OM2 and chronic occlusion of SVG-D2 and SVG-PDA and no targets for intervention), hypothyroidism, hypertension, OSA, hypothyroidism, HLD, depression, history of left lung radiation therapy, with chronic hypoxic respiratory failure but typically uses only 2 L of oxygen at night at a time as needed and BPH.. -- Presented to ED with complaints of cough, malaise, fatigue and myalgia as well as lethargy for the last 5 days or so -Patient is now requiring oxygen at the time -Patient's son who lives with him tested positive for flu--and pretty sick and quite hypoxic--patient's son was not vaccinated against the flu -Patient did not get his flu vaccine--he is also testing positive for influenza A, however he still requiring 2 L of oxygen despite his advanced age and comorbidities - No Nausea, Vomiting or Diarrhea -Apparently had fevers and chills at home Tmax here is 99.9 -In the ED chest x-ray with postradiation changes in left upper lobe, no new acute cardiopulmonary findings -Troponin is not elevated -COVID and RSV negative, influenza A+ -D-dimer adjusted for  age is not elevated and is close to prior baselines -WBC 6.1 hemoglobin is 11.7 which is close to prior platelets 167 -Sodium is 132, creatinine 1.1 bicarb 28 potassium 3.9 -EKG with no LBB, no new acute changes  Significant Events: Admitted 02/20/2024 for influenza, A, COPD exacerbation, acute on chronic respiratory failure with hypoxia   Significant Labs: WBC 6.1, Hg 11.7, Plt 167 Na 132, K 3.9, CO2 of 28, BUN 15, scr 1.13\ Influenza A POSITIVE  Significant Imaging Studies: CXR Suspected radiation changes in the left upper lobe. No acute cardiopulmonary disease.  Antibiotic Therapy: Anti-infectives (From admission, onward)    Start     Dose/Rate Route Frequency Ordered Stop   02/21/24 0900  oseltamivir (TAMIFLU) capsule 75 mg        75 mg Oral 2 times daily 02/21/24 8119 02/26/24 0959       Procedures:   Consultants:     Assessment and Plan: * Influenza A 02-22-2024 pt has had symptoms for about 2 weeks. Outside window for tamilflu to be effective. Stop tamiflu. Continue supportive care. 02-23-2024 continue supportive care.  02-24-2024 DC to home.  COPD with acute exacerbation (HCC) 02-22-2024 change to po prednisone 40 mg daily. Stop IV solumedrol. Continue with nebs q6h. 02-23-2024 still wheezing some today. But better air exchange. Remains on 2 L/min O2, prednisone 40 mg daily, QID nebs. Pt has home O2 already, home nebulizer machine. Pt requesting discharge today. Discussed with him that he need PT evaluation to see if he stable to walk and be safe at home. Pt agrees to  stay for PT evaluation today. If PT thinks he is stable to go home, then pt can be discharged to home and he can f/u with PCP early next week.  02-24-2024 DC to home. Pt has home nebulizer machine and albuterol neb medication. DC with prednisone over next 6 days.  Acute on chronic respiratory failure with hypoxia (HCC) - on nocturnal O2 @ 2 L/min. 02-22-2024 pt uses O2 2 L/min at night time. Using  O2 continuously now. Due to COPD Exacerbation from influenza A infection. Pt does not have pneumonia. 02-23-2024 continue with supplemental O2.  02-24-2024 pt has home O2 2 L/min. Usually wears at night.  Obesity, Class I, BMI 30-34.9 Estimated body mass index is 32.78 kg/m as calculated from the following:   Height as of this encounter: 5\' 9"  (1.753 m).   Weight as of this encounter: 100.7 kg.   Benign prostatic hyperplasia with urinary obstruction 02-22-2024 stable. Chronic. On avodart, uroxatral. 02-23-2024 stable. Chronic. 02-24-2024 stable. Chronic. DC to home.  Coronary artery disease - not on betablocker due to baseline bradycardia. Documented by cardiology 02-22-2024 stable. Chronic. On imdur and ranexa. On ASA and crestor. 02-23-2024 stable. Chronic.  02-24-2024 pt not on betablocker due to baseline bradycardia. Documented by cardiology. Refer back to Dr. Diona Browner to be seen. Only wants to see physician. Continue ranexa and imdur. Prn SL NTG. Continue ASA and crestor  HYPERTENSION, BENIGN 02-22-2024 stable. Chronic. Continue imdur. Holding lasix due to poor appetite. 02-23-2024 stable. Chronic.  02-24-2024 stable. Hold lasix for 1 week. Then restart.  Alzheimer disease (HCC) 02-22-2024 stable. Chronic. On namenda bid. Monitor for hospital-acquired delirium. Prn seroquel. 02-23-2024 stable. Chronic.  02-24-2024 stable. DC to home  OSA (obstructive sleep apnea) 02-22-2024 stable. Chronic. 02-23-2024 stable. Chronic.  02-24-2024 stable. DC to home       DVT prophylaxis: heparin injection 5,000 Units Start: 02/21/24 2200 SCDs Start: 02/21/24 0819 Place TED hose Start: 02/21/24 0819    Code Status: Full Code Family Communication: no family at bedside. Pt is decisional. Disposition Plan: return home Reason for continuing need for hospitalization: DC to home today.  Objective: Vitals:   02/23/24 2018 02/24/24 0500 02/24/24 0756 02/24/24 0759  BP: (!) 143/52  (!) 161/66    Pulse: 61 (!) 55    Resp: 17 18    Temp: 97.8 F (36.6 C) 97.9 F (36.6 C)    TempSrc: Oral Oral    SpO2: 97% 93% 92% 97%  Weight:      Height:        Intake/Output Summary (Last 24 hours) at 02/24/2024 0829 Last data filed at 02/23/2024 1700 Gross per 24 hour  Intake 720 ml  Output --  Net 720 ml   Filed Weights   02/21/24 1442  Weight: 100.7 kg   Examination:  Physical Exam Vitals and nursing note reviewed.  Constitutional:      General: He is not in acute distress.    Appearance: He is not toxic-appearing or diaphoretic.  HENT:     Head: Normocephalic and atraumatic.     Nose: Nose normal.  Eyes:     General: No scleral icterus. Cardiovascular:     Rate and Rhythm: Normal rate and regular rhythm.     Pulses: Normal pulses.  Pulmonary:     Effort: Pulmonary effort is normal. No respiratory distress.     Breath sounds: Normal breath sounds.     Comments: Faint wheeze today. No distress. Good air exchange. Abdominal:  General: Bowel sounds are normal. There is no distension.     Palpations: Abdomen is soft.  Musculoskeletal:     Right lower leg: No edema.     Left lower leg: No edema.  Skin:    General: Skin is warm and dry.     Capillary Refill: Capillary refill takes less than 2 seconds.  Neurological:     General: No focal deficit present.     Mental Status: He is alert and oriented to person, place, and time.   Data Reviewed: I have personally reviewed following labs and imaging studies  CBC: Recent Labs  Lab 02/20/24 2308 02/23/24 1117 02/24/24 0419  WBC 6.1 13.1* 10.6*  NEUTROABS  --  11.4* 7.6  HGB 11.7* 12.1* 11.8*  HCT 36.2* 37.9* 37.4*  MCV 96.0 95.2 96.9  PLT 167 190 172   Basic Metabolic Panel: Recent Labs  Lab 02/20/24 2308 02/23/24 1117 02/24/24 0419  NA 132* 135 136  K 3.9 3.7 3.6  CL 93* 95* 100  CO2 28 30 31   GLUCOSE 142* 124* 115*  BUN 15 27* 23  CREATININE 1.13 1.00 1.04  CALCIUM 8.9 8.9 8.8*    GFR: Estimated Creatinine Clearance: 58.5 mL/min (by C-G formula based on SCr of 1.04 mg/dL). Liver Function Tests: Recent Labs  Lab 02/23/24 1117 02/24/24 0419  AST 28 22  ALT 19 17  ALKPHOS 43 40  BILITOT 0.7 0.9  PROT 6.0* 6.3*  ALBUMIN 3.2* 3.2*   Recent Results (from the past 240 hours)  Resp panel by RT-PCR (RSV, Flu A&B, Covid) Anterior Nasal Swab     Status: Abnormal   Collection Time: 02/20/24 11:09 PM   Specimen: Anterior Nasal Swab  Result Value Ref Range Status   SARS Coronavirus 2 by RT PCR NEGATIVE NEGATIVE Final    Comment: (NOTE) SARS-CoV-2 target nucleic acids are NOT DETECTED.  The SARS-CoV-2 RNA is generally detectable in upper respiratory specimens during the acute phase of infection. The lowest concentration of SARS-CoV-2 viral copies this assay can detect is 138 copies/mL. A negative result does not preclude SARS-Cov-2 infection and should not be used as the sole basis for treatment or other patient management decisions. A negative result may occur with  improper specimen collection/handling, submission of specimen other than nasopharyngeal swab, presence of viral mutation(s) within the areas targeted by this assay, and inadequate number of viral copies(<138 copies/mL). A negative result must be combined with clinical observations, patient history, and epidemiological information. The expected result is Negative.  Fact Sheet for Patients:  BloggerCourse.com  Fact Sheet for Healthcare Providers:  SeriousBroker.it  This test is no t yet approved or cleared by the [place] FDA and  has been authorized for detection and/or diagnosis of SARS-CoV-2 by FDA under an Emergency Use Authorization (EUA). This EUA will remain  in effect (meaning this test can be used) for the duration of the COVID-19 declaration under Section 564(b)(1) of the Act, 21 U.S.C.section 360bbb-3(b)(1), unless the authorization  is terminated  or revoked sooner.       Influenza A by PCR POSITIVE (A) NEGATIVE Final   Influenza B by PCR NEGATIVE NEGATIVE Final    Comment: (NOTE) The Xpert Xpress SARS-CoV-2/FLU/RSV plus assay is intended as an aid in the diagnosis of influenza from Nasopharyngeal swab specimens and should not be used as a sole basis for treatment. Nasal washings and aspirates are unacceptable for Xpert Xpress SARS-CoV-2/FLU/RSV testing.  Fact Sheet for Patients: BloggerCourse.com  Fact Sheet for Healthcare  Providers: SeriousBroker.it  This test is not yet approved or cleared by the [place] and has been authorized for detection and/or diagnosis of SARS-CoV-2 by FDA under an Emergency Use Authorization (EUA). This EUA will remain in effect (meaning this test can be used) for the duration of the COVID-19 declaration under Section 564(b)(1) of the Act, 21 U.S.C. section 360bbb-3(b)(1), unless the authorization is terminated or revoked.     Resp Syncytial Virus by PCR NEGATIVE NEGATIVE Final    Comment: (NOTE) Fact Sheet for Patients: BloggerCourse.com  Fact Sheet for Healthcare Providers: SeriousBroker.it  This test is not yet approved or cleared by the [place] FDA and has been authorized for detection and/or diagnosis of SARS-CoV-2 by FDA under an Emergency Use Authorization (EUA). This EUA will remain in effect (meaning this test can be used) for the duration of the COVID-19 declaration under Section 564(b)(1) of the Act, 21 U.S.C. section 360bbb-3(b)(1), unless the authorization is terminated or revoked.  Performed at [facility], [address], [place], [place] [address]      Scheduled Meds:  alfuzosin  10 mg Oral Q breakfast   aspirin EC  81 mg Oral Daily   budesonide (PULMICORT) nebulizer solution  0.25 mg Nebulization BID   clopidogrel  75 mg Oral Daily    DULoxetine  30 mg Oral Daily   dutasteride  0.5 mg Oral Daily   escitalopram  20 mg Oral q morning   gabapentin  100 mg Oral TID   heparin  5,000 Units Subcutaneous Q8H   ipratropium-albuterol  3 mL Nebulization Q6H   isosorbide mononitrate  120 mg Oral q morning   levothyroxine  75 mcg Oral Daily   memantine  10 mg Oral BID   pantoprazole  40 mg Oral Daily   predniSONE  40 mg Oral Q breakfast   ranolazine  500 mg Oral BID   rosuvastatin  40 mg Oral q1800   senna-docusate  2 tablet Oral QHS   sodium chloride flush  3 mL Intravenous Q12H   sodium chloride flush  3 mL Intravenous Q12H   Continuous Infusions:   LOS: 3 days   Time spent: 40 minutes  Carollee Herter, DO  [name]  02/24/2024, 8:29 AM

## 2024-03-02 DIAGNOSIS — G894 Chronic pain syndrome: Secondary | ICD-10-CM | POA: Diagnosis not present

## 2024-03-02 DIAGNOSIS — F1120 Opioid dependence, uncomplicated: Secondary | ICD-10-CM | POA: Diagnosis not present

## 2024-03-02 DIAGNOSIS — J101 Influenza due to other identified influenza virus with other respiratory manifestations: Secondary | ICD-10-CM | POA: Diagnosis not present

## 2024-03-02 DIAGNOSIS — I5032 Chronic diastolic (congestive) heart failure: Secondary | ICD-10-CM | POA: Diagnosis not present

## 2024-03-02 DIAGNOSIS — G309 Alzheimer's disease, unspecified: Secondary | ICD-10-CM | POA: Diagnosis not present

## 2024-03-02 DIAGNOSIS — I209 Angina pectoris, unspecified: Secondary | ICD-10-CM | POA: Diagnosis not present

## 2024-03-02 DIAGNOSIS — M4302 Spondylolysis, cervical region: Secondary | ICD-10-CM | POA: Diagnosis not present

## 2024-03-02 DIAGNOSIS — M48061 Spinal stenosis, lumbar region without neurogenic claudication: Secondary | ICD-10-CM | POA: Diagnosis not present

## 2024-03-02 DIAGNOSIS — J449 Chronic obstructive pulmonary disease, unspecified: Secondary | ICD-10-CM | POA: Diagnosis not present

## 2024-03-08 DIAGNOSIS — I2510 Atherosclerotic heart disease of native coronary artery without angina pectoris: Secondary | ICD-10-CM | POA: Diagnosis not present

## 2024-03-08 DIAGNOSIS — G309 Alzheimer's disease, unspecified: Secondary | ICD-10-CM | POA: Diagnosis not present

## 2024-03-08 DIAGNOSIS — F0284 Dementia in other diseases classified elsewhere, unspecified severity, with anxiety: Secondary | ICD-10-CM | POA: Diagnosis not present

## 2024-03-08 DIAGNOSIS — J441 Chronic obstructive pulmonary disease with (acute) exacerbation: Secondary | ICD-10-CM | POA: Diagnosis not present

## 2024-03-08 DIAGNOSIS — I10 Essential (primary) hypertension: Secondary | ICD-10-CM | POA: Diagnosis not present

## 2024-03-08 DIAGNOSIS — F32A Depression, unspecified: Secondary | ICD-10-CM | POA: Diagnosis not present

## 2024-03-08 DIAGNOSIS — F0283 Dementia in other diseases classified elsewhere, unspecified severity, with mood disturbance: Secondary | ICD-10-CM | POA: Diagnosis not present

## 2024-03-08 DIAGNOSIS — J9621 Acute and chronic respiratory failure with hypoxia: Secondary | ICD-10-CM | POA: Diagnosis not present

## 2024-03-08 DIAGNOSIS — I252 Old myocardial infarction: Secondary | ICD-10-CM | POA: Diagnosis not present

## 2024-03-13 DIAGNOSIS — I2510 Atherosclerotic heart disease of native coronary artery without angina pectoris: Secondary | ICD-10-CM | POA: Diagnosis not present

## 2024-03-13 DIAGNOSIS — I10 Essential (primary) hypertension: Secondary | ICD-10-CM | POA: Diagnosis not present

## 2024-03-13 DIAGNOSIS — G309 Alzheimer's disease, unspecified: Secondary | ICD-10-CM | POA: Diagnosis not present

## 2024-03-13 DIAGNOSIS — F0284 Dementia in other diseases classified elsewhere, unspecified severity, with anxiety: Secondary | ICD-10-CM | POA: Diagnosis not present

## 2024-03-13 DIAGNOSIS — J9621 Acute and chronic respiratory failure with hypoxia: Secondary | ICD-10-CM | POA: Diagnosis not present

## 2024-03-13 DIAGNOSIS — J441 Chronic obstructive pulmonary disease with (acute) exacerbation: Secondary | ICD-10-CM | POA: Diagnosis not present

## 2024-03-13 DIAGNOSIS — F32A Depression, unspecified: Secondary | ICD-10-CM | POA: Diagnosis not present

## 2024-03-13 DIAGNOSIS — I252 Old myocardial infarction: Secondary | ICD-10-CM | POA: Diagnosis not present

## 2024-03-13 DIAGNOSIS — F0283 Dementia in other diseases classified elsewhere, unspecified severity, with mood disturbance: Secondary | ICD-10-CM | POA: Diagnosis not present

## 2024-03-15 ENCOUNTER — Telehealth: Payer: Self-pay | Admitting: *Deleted

## 2024-03-15 DIAGNOSIS — J441 Chronic obstructive pulmonary disease with (acute) exacerbation: Secondary | ICD-10-CM | POA: Diagnosis not present

## 2024-03-15 DIAGNOSIS — I10 Essential (primary) hypertension: Secondary | ICD-10-CM | POA: Diagnosis not present

## 2024-03-15 DIAGNOSIS — J9621 Acute and chronic respiratory failure with hypoxia: Secondary | ICD-10-CM | POA: Diagnosis not present

## 2024-03-15 DIAGNOSIS — F32A Depression, unspecified: Secondary | ICD-10-CM | POA: Diagnosis not present

## 2024-03-15 DIAGNOSIS — F0283 Dementia in other diseases classified elsewhere, unspecified severity, with mood disturbance: Secondary | ICD-10-CM | POA: Diagnosis not present

## 2024-03-15 DIAGNOSIS — I2510 Atherosclerotic heart disease of native coronary artery without angina pectoris: Secondary | ICD-10-CM | POA: Diagnosis not present

## 2024-03-15 DIAGNOSIS — G309 Alzheimer's disease, unspecified: Secondary | ICD-10-CM | POA: Diagnosis not present

## 2024-03-15 DIAGNOSIS — I252 Old myocardial infarction: Secondary | ICD-10-CM | POA: Diagnosis not present

## 2024-03-15 DIAGNOSIS — F0284 Dementia in other diseases classified elsewhere, unspecified severity, with anxiety: Secondary | ICD-10-CM | POA: Diagnosis not present

## 2024-03-15 NOTE — Telephone Encounter (Signed)
 Called patient to inform of CT for 03-26-24- arrival time- 1:15 pm @ [name],no restrictions to scan, spoke with patient and he is aware of this scan and the instructions

## 2024-03-22 ENCOUNTER — Telehealth: Payer: Self-pay | Admitting: *Deleted

## 2024-03-22 NOTE — Telephone Encounter (Signed)
 CALLED PATIENT TO ASK ABOUT COMING FOR FU ON 03-27-24 DUE TO DR. KINARD BEING OFF 03-29-24, SPOKE WITH PATIENT AND HE AGREED TO COME ON 03-27-24 @ 10 AM

## 2024-03-23 ENCOUNTER — Telehealth: Payer: Self-pay | Admitting: *Deleted

## 2024-03-23 ENCOUNTER — Telehealth: Payer: Self-pay | Admitting: Radiation Oncology

## 2024-03-23 DIAGNOSIS — J441 Chronic obstructive pulmonary disease with (acute) exacerbation: Secondary | ICD-10-CM | POA: Diagnosis not present

## 2024-03-23 DIAGNOSIS — I10 Essential (primary) hypertension: Secondary | ICD-10-CM | POA: Diagnosis not present

## 2024-03-23 DIAGNOSIS — F32A Depression, unspecified: Secondary | ICD-10-CM | POA: Diagnosis not present

## 2024-03-23 DIAGNOSIS — G309 Alzheimer's disease, unspecified: Secondary | ICD-10-CM | POA: Diagnosis not present

## 2024-03-23 DIAGNOSIS — I252 Old myocardial infarction: Secondary | ICD-10-CM | POA: Diagnosis not present

## 2024-03-23 DIAGNOSIS — F0283 Dementia in other diseases classified elsewhere, unspecified severity, with mood disturbance: Secondary | ICD-10-CM | POA: Diagnosis not present

## 2024-03-23 DIAGNOSIS — I2510 Atherosclerotic heart disease of native coronary artery without angina pectoris: Secondary | ICD-10-CM | POA: Diagnosis not present

## 2024-03-23 DIAGNOSIS — F0284 Dementia in other diseases classified elsewhere, unspecified severity, with anxiety: Secondary | ICD-10-CM | POA: Diagnosis not present

## 2024-03-23 DIAGNOSIS — J9621 Acute and chronic respiratory failure with hypoxia: Secondary | ICD-10-CM | POA: Diagnosis not present

## 2024-03-23 NOTE — Telephone Encounter (Signed)
 Pt's caregiver called asking to move 4/1 appt. She requested delay due to pt and his spouse having other appts. Appt r/s to 4/14 @ 2:00pm.

## 2024-03-23 NOTE — Telephone Encounter (Signed)
 RETURNED PATIENT'S DAUGHTER IN-LAW'S PHONE CALL, SPOKE WITH DAUGHTER IN-LAW ANGELA Bruschi

## 2024-03-26 ENCOUNTER — Ambulatory Visit (HOSPITAL_COMMUNITY)
Admission: RE | Admit: 2024-03-26 | Discharge: 2024-03-26 | Disposition: A | Discharge status: Home / Self Care | Source: Ambulatory Visit | Attending: Radiation Oncology | Admitting: Radiation Oncology

## 2024-03-26 DIAGNOSIS — C3432 Malignant neoplasm of lower lobe, left bronchus or lung: Secondary | ICD-10-CM | POA: Diagnosis not present

## 2024-03-26 DIAGNOSIS — I700 Atherosclerosis of aorta: Secondary | ICD-10-CM | POA: Diagnosis not present

## 2024-03-26 DIAGNOSIS — C3490 Malignant neoplasm of unspecified part of unspecified bronchus or lung: Secondary | ICD-10-CM | POA: Diagnosis not present

## 2024-03-26 DIAGNOSIS — J439 Emphysema, unspecified: Secondary | ICD-10-CM | POA: Diagnosis not present

## 2024-03-27 ENCOUNTER — Ambulatory Visit: Payer: Self-pay | Admitting: Radiation Oncology

## 2024-03-28 DIAGNOSIS — I2510 Atherosclerotic heart disease of native coronary artery without angina pectoris: Secondary | ICD-10-CM | POA: Diagnosis not present

## 2024-03-28 DIAGNOSIS — G309 Alzheimer's disease, unspecified: Secondary | ICD-10-CM | POA: Diagnosis not present

## 2024-03-28 DIAGNOSIS — F0283 Dementia in other diseases classified elsewhere, unspecified severity, with mood disturbance: Secondary | ICD-10-CM | POA: Diagnosis not present

## 2024-03-28 DIAGNOSIS — I252 Old myocardial infarction: Secondary | ICD-10-CM | POA: Diagnosis not present

## 2024-03-28 DIAGNOSIS — F32A Depression, unspecified: Secondary | ICD-10-CM | POA: Diagnosis not present

## 2024-03-28 DIAGNOSIS — I10 Essential (primary) hypertension: Secondary | ICD-10-CM | POA: Diagnosis not present

## 2024-03-28 DIAGNOSIS — F0284 Dementia in other diseases classified elsewhere, unspecified severity, with anxiety: Secondary | ICD-10-CM | POA: Diagnosis not present

## 2024-03-28 DIAGNOSIS — F0280 Dementia in other diseases classified elsewhere without behavioral disturbance: Secondary | ICD-10-CM | POA: Diagnosis not present

## 2024-03-28 DIAGNOSIS — J9621 Acute and chronic respiratory failure with hypoxia: Secondary | ICD-10-CM | POA: Diagnosis not present

## 2024-03-28 DIAGNOSIS — J441 Chronic obstructive pulmonary disease with (acute) exacerbation: Secondary | ICD-10-CM | POA: Diagnosis not present

## 2024-03-29 ENCOUNTER — Ambulatory Visit: Payer: Self-pay | Admitting: Radiation Oncology

## 2024-03-29 DIAGNOSIS — G8929 Other chronic pain: Secondary | ICD-10-CM | POA: Diagnosis not present

## 2024-03-29 DIAGNOSIS — Z79899 Other long term (current) drug therapy: Secondary | ICD-10-CM | POA: Diagnosis not present

## 2024-03-29 DIAGNOSIS — M549 Dorsalgia, unspecified: Secondary | ICD-10-CM | POA: Diagnosis not present

## 2024-03-29 DIAGNOSIS — M129 Arthropathy, unspecified: Secondary | ICD-10-CM | POA: Diagnosis not present

## 2024-03-31 ENCOUNTER — Other Ambulatory Visit: Payer: Self-pay

## 2024-03-31 ENCOUNTER — Emergency Department (HOSPITAL_COMMUNITY)
Admission: EM | Admit: 2024-03-31 | Discharge: 2024-04-01 | Disposition: A | Discharge status: Home / Self Care | Attending: Emergency Medicine | Admitting: Emergency Medicine

## 2024-03-31 ENCOUNTER — Encounter (HOSPITAL_COMMUNITY): Payer: Self-pay

## 2024-03-31 ENCOUNTER — Emergency Department (HOSPITAL_COMMUNITY)

## 2024-03-31 DIAGNOSIS — K5909 Other constipation: Secondary | ICD-10-CM | POA: Diagnosis not present

## 2024-03-31 DIAGNOSIS — Z7982 Long term (current) use of aspirin: Secondary | ICD-10-CM | POA: Insufficient documentation

## 2024-03-31 DIAGNOSIS — K449 Diaphragmatic hernia without obstruction or gangrene: Secondary | ICD-10-CM | POA: Diagnosis not present

## 2024-03-31 DIAGNOSIS — K560 Paralytic ileus: Secondary | ICD-10-CM | POA: Diagnosis not present

## 2024-03-31 DIAGNOSIS — N281 Cyst of kidney, acquired: Secondary | ICD-10-CM | POA: Diagnosis not present

## 2024-03-31 DIAGNOSIS — R109 Unspecified abdominal pain: Secondary | ICD-10-CM | POA: Diagnosis not present

## 2024-03-31 DIAGNOSIS — K567 Ileus, unspecified: Secondary | ICD-10-CM | POA: Diagnosis not present

## 2024-03-31 DIAGNOSIS — E876 Hypokalemia: Secondary | ICD-10-CM | POA: Diagnosis not present

## 2024-03-31 DIAGNOSIS — R140 Abdominal distension (gaseous): Secondary | ICD-10-CM | POA: Diagnosis not present

## 2024-03-31 DIAGNOSIS — R918 Other nonspecific abnormal finding of lung field: Secondary | ICD-10-CM | POA: Diagnosis not present

## 2024-03-31 DIAGNOSIS — K5730 Diverticulosis of large intestine without perforation or abscess without bleeding: Secondary | ICD-10-CM | POA: Diagnosis not present

## 2024-03-31 DIAGNOSIS — D72829 Elevated white blood cell count, unspecified: Secondary | ICD-10-CM | POA: Diagnosis not present

## 2024-03-31 DIAGNOSIS — K5641 Fecal impaction: Secondary | ICD-10-CM | POA: Diagnosis present

## 2024-03-31 DIAGNOSIS — K5900 Constipation, unspecified: Secondary | ICD-10-CM | POA: Diagnosis not present

## 2024-03-31 LAB — BASIC METABOLIC PANEL WITH GFR
Anion gap: 10 (ref 5–15)
BUN: 13 mg/dL (ref 8–23)
CO2: 27 mmol/L (ref 22–32)
Calcium: 9.6 mg/dL (ref 8.9–10.3)
Chloride: 103 mmol/L (ref 98–111)
Creatinine, Ser: 0.91 mg/dL (ref 0.61–1.24)
GFR, Estimated: >60 mL/min (ref >60)
Glucose, Bld: 154 mg/dL — ABNORMAL HIGH (ref 70–99)
Potassium: 3 mmol/L — ABNORMAL LOW (ref 3.5–5.1)
Sodium: 140 mmol/L (ref 135–145)

## 2024-03-31 LAB — MAGNESIUM: Magnesium: 2.5 mg/dL — ABNORMAL HIGH (ref 1.7–2.4)

## 2024-03-31 LAB — I-STAT CHEM 8, ED
BUN: 14 mg/dL (ref 8–23)
Calcium, Ion: 1.19 mmol/L (ref 1.15–1.40)
Chloride: 101 mmol/L (ref 98–111)
Creatinine, Ser: 1 mg/dL (ref 0.61–1.24)
Glucose, Bld: 153 mg/dL — ABNORMAL HIGH (ref 70–99)
HCT: 38 % — ABNORMAL LOW (ref 39.0–52.0)
Hemoglobin: 12.9 g/dL — ABNORMAL LOW (ref 13.0–17.0)
Potassium: 3 mmol/L — ABNORMAL LOW (ref 3.5–5.1)
Sodium: 140 mmol/L (ref 135–145)
TCO2: 28 mmol/L (ref 22–32)

## 2024-03-31 LAB — CBC
HCT: 39.2 % (ref 39.0–52.0)
Hemoglobin: 13.2 g/dL (ref 13.0–17.0)
MCH: 31.1 pg (ref 26.0–34.0)
MCHC: 33.7 g/dL (ref 30.0–36.0)
MCV: 92.5 fL (ref 80.0–100.0)
Platelets: 255 10*3/uL (ref 150–400)
RBC: 4.24 MIL/uL (ref 4.22–5.81)
RDW: 14.3 % (ref 11.5–15.5)
WBC: 11.4 10*3/uL — ABNORMAL HIGH (ref 4.0–10.5)
nRBC: 0 % (ref 0.0–0.2)

## 2024-03-31 MED ORDER — POTASSIUM CHLORIDE CRYS ER 20 MEQ PO TBCR: EXTENDED_RELEASE_TABLET | ORAL | 0 refills | Status: DC

## 2024-03-31 MED ORDER — IOHEXOL 300 MG/ML  SOLN
100.0000 mL | Freq: Once | INTRAMUSCULAR | Status: AC | PRN
  Administered 2024-03-31: 100 mL via INTRAVENOUS

## 2024-03-31 MED ORDER — BISACODYL 10 MG RE SUPP
10.0000 mg | Freq: Once | RECTAL | Status: AC
  Administered 2024-03-31: 10 mg via RECTAL
  Filled 2024-03-31: qty 1

## 2024-03-31 MED ORDER — LACTATED RINGERS IV BOLUS
1000.0000 mL | Freq: Once | INTRAVENOUS | Status: AC
  Administered 2024-03-31: 1000 mL via INTRAVENOUS

## 2024-03-31 MED ORDER — POTASSIUM CHLORIDE 20 MEQ PO PACK
40.0000 meq | PACK | Freq: Once | ORAL | Status: AC
  Administered 2024-03-31: 40 meq via ORAL
  Filled 2024-03-31: qty 2

## 2024-03-31 MED ORDER — MORPHINE SULFATE (PF) 2 MG/ML IV SOLN
2.0000 mg | Freq: Once | INTRAVENOUS | Status: AC
  Administered 2024-03-31: 2 mg via INTRAVENOUS
  Filled 2024-03-31: qty 1

## 2024-03-31 NOTE — ED Notes (Signed)
 Patient transported to CT

## 2024-03-31 NOTE — ED Provider Notes (Addendum)
 [place] [facility]MENT AT [facility] Provider Note   CSN: [id number] Arrival date & time: 03/31/24  1808     History  Chief Complaint  Patient presents with   Fecal Impaction    Dustin [name] is a 89 y.o. male.  Pt c/o constipation in the past four weeks. Indicates last full/normal bm was ~ 4  weeks ago, is passing liquid stool at times. No abd pain or vomiting. No fever or chills. Hx constipation in the past, has seen pcp w same. Has tried a variety of otc tx without success, although no consistent bowel regimen. No back pain or radicular pain. No numbness/weakness. No problems w gu fxn, urinating normally.   The history is provided by the patient, a relative and medical records.       Home Medications Prior to Admission medications   Medication Sig Start Date End Date Taking? Authorizing Provider  albuterol (PROVENTIL) (2.5 MG/3ML) 0.083% nebulizer solution Take 3 mLs (2.5 mg total) by nebulization every 2 (two) hours as needed for wheezing or shortness of breath. 11/24/22   Shon Hale, MD  albuterol (VENTOLIN HFA) 108 (90 Base) MCG/ACT inhaler Inhale 2 puffs into the lungs every 6 (six) hours as needed for wheezing or shortness of breath. 11/24/22   Shon Hale, MD  alfuzosin (UROXATRAL) 10 MG 24 hr tablet TAKE 1 TABLET DAILY WITH BREAKFAST. 05/10/23   McKenzie, Mardene Celeste, MD  aspirin EC 81 MG tablet Take 1 tablet (81 mg total) by mouth daily. Swallow whole. 02/24/24   Carollee Herter, DO  Budeson-Glycopyrrol-Formoterol (BREZTRI AEROSPHERE) 160-9-4.8 MCG/ACT AERO Inhale 2 puffs into the lungs in the morning and at bedtime. 03/16/23   Coralyn Helling, MD  DULoxetine (CYMBALTA) 30 MG capsule Take 30 mg by mouth daily. 05/10/23   [provider]  escitalopram (LEXAPRO) 20 MG tablet Take 20 mg by mouth every morning.     [provider]  furosemide (LASIX) 20 MG tablet Take 1 tablet (20 mg total) by mouth 2 (two) times daily. For fluid 11/24/22    Shon Hale, MD  gabapentin (NEURONTIN) 100 MG capsule Take 100 mg by mouth 3 (three) times daily. 03/04/23   [provider]  isosorbide mononitrate (IMDUR) 120 MG 24 hr tablet Take 1 tablet (120 mg total) by mouth every morning. 11/24/22   Shon Hale, MD  levothyroxine (SYNTHROID) 75 MCG tablet Take 75 mcg by mouth daily. 02/23/22   [provider]  meclizine (ANTIVERT) 25 MG tablet Take 25 mg by mouth 4 (four) times daily as needed for dizziness. 12/12/23   [provider]  memantine (NAMENDA) 10 MG tablet Take 10 mg by mouth 2 (two) times daily.  03/05/16   [provider]  nitroGLYCERIN (NITROSTAT) 0.4 MG SL tablet Place 1 tablet (0.4 mg total) under the tongue every 5 (five) minutes as needed for chest pain. 11/24/22   Shon Hale, MD  oxycodone (ROXICODONE) 30 MG immediate release tablet Take 30 mg by mouth every 6 (six) hours as needed for pain. 09/02/21   [provider]  pantoprazole (PROTONIX) 40 MG tablet Take 1 tablet (40 mg total) by mouth 2 (two) times daily for 14 days, THEN 1 tablet (40 mg total) daily. 02/14/23 02/28/24  Sharlene Dory, NP  potassium chloride SA (KLOR-CON M) 20 MEQ tablet Take 1 tablet (20 mEq total) by mouth daily. 11/24/22   Shon Hale, MD  promethazine (PHENERGAN) 25 MG tablet Take 25 mg by mouth 4 (four) times  daily as needed for nausea or vomiting. 06/16/23   [provider]  ranolazine (RANEXA) 500 MG 12 hr tablet Take 500 mg by mouth 2 (two) times daily. 07/06/23   [provider]  risperiDONE (RISPERDAL) 0.25 MG tablet Take 0.25 mg by mouth at bedtime. 03/18/23   [provider]  rosuvastatin (CRESTOR) 40 MG tablet Take 1 tablet (40 mg total) by mouth daily at 6 PM. 02/24/24 03/25/24  Carollee Herter, DO      Allergies    Patient has no known allergies.    Review of Systems   Review of Systems  Constitutional:  Negative for fever.  Respiratory:  Negative for shortness of  breath.   Cardiovascular:  Negative for chest pain.  Gastrointestinal:  Negative for abdominal pain and vomiting.  Genitourinary:  Negative for difficulty urinating, dysuria and flank pain.  Musculoskeletal:  Negative for back pain.  Neurological:  Negative for headaches.    Physical Exam Updated Vital Signs BP 134/88   Pulse 70   Temp 97.6 F (36.4 C) (Temporal)   Resp 18   Ht 1.778 m (5\' 10" )   Wt 98.4 kg   SpO2 94%   BMI 31.14 kg/m  Physical Exam Vitals and nursing note reviewed.  Constitutional:      Appearance: Normal appearance. He is well-developed.  HENT:     Head: Atraumatic.     Nose: Nose normal.     Mouth/Throat:     Mouth: Mucous membranes are moist.  Eyes:     General: No scleral icterus.    Conjunctiva/sclera: Conjunctivae normal.  Neck:     Trachea: No tracheal deviation.  Cardiovascular:     Rate and Rhythm: Normal rate and regular rhythm.     Pulses: Normal pulses.     Heart sounds: Normal heart sounds. No murmur heard.    No friction rub. No gallop.  Pulmonary:     Effort: Pulmonary effort is normal. No accessory muscle usage or respiratory distress.     Breath sounds: Normal breath sounds.  Abdominal:     General: Bowel sounds are normal. There is no distension.     Palpations: Abdomen is soft.     Tenderness: There is no abdominal tenderness. There is no guarding.     Comments: No incarcerated hernia. No mass felt.   Genitourinary:    Comments: Large amount of soft stool in rectum - was able to disimpact small amount soft stool before patient terminated rectal exam or further disimpaction attempt.  No mass felt. Stool light brown in color.  Musculoskeletal:        General: No swelling.     Cervical back: Neck supple.  Skin:    General: Skin is warm and dry.     Findings: No rash.  Neurological:     Mental Status: He is alert.     Comments: Alert, speech clear. Motor/sens grossly intact bil.   Psychiatric:        Mood and Affect: Mood  normal.    ED Results / Procedures / Treatments   Labs (all labs ordered are listed, but only abnormal results are displayed) Results for orders placed or performed during the hospital encounter of 03/31/24  CBC   Collection Time: 03/31/24  7:29 PM  Result Value Ref Range   WBC 11.4 (H) 4.0 - 10.5 K/uL   RBC 4.24 4.22 - 5.81 MIL/uL   Hemoglobin 13.2 13.0 - 17.0 g/dL   HCT 40.9 81.1 - 91.4 %  MCV 92.5 80.0 - 100.0 fL   MCH 31.1 26.0 - 34.0 pg   MCHC 33.7 30.0 - 36.0 g/dL   RDW 47.8 29.5 - 62.1 %   Platelets 255 150 - 400 K/uL   nRBC 0.0 0.0 - 0.2 %  Basic metabolic panel with GFR   Collection Time: 03/31/24  7:29 PM  Result Value Ref Range   Sodium 140 135 - 145 mmol/L   Potassium 3.0 (L) 3.5 - 5.1 mmol/L   Chloride 103 98 - 111 mmol/L   CO2 27 22 - 32 mmol/L   Glucose, Bld 154 (H) 70 - 99 mg/dL   BUN 13 8 - 23 mg/dL   Creatinine, Ser 3.08 0.61 - 1.24 mg/dL   Calcium 9.6 8.9 - 65.7 mg/dL   GFR, Estimated >84 >69 mL/min   Anion gap 10 5 - 15  I-stat chem 8, ED   Collection Time: 03/31/24  8:10 PM  Result Value Ref Range   Sodium 140 135 - 145 mmol/L   Potassium 3.0 (L) 3.5 - 5.1 mmol/L   Chloride 101 98 - 111 mmol/L   BUN 14 8 - 23 mg/dL   Creatinine, Ser 6.29 0.61 - 1.24 mg/dL   Glucose, Bld 528 (H) 70 - 99 mg/dL   Calcium, Ion 4.13 2.44 - 1.40 mmol/L   TCO2 28 22 - 32 mmol/L   Hemoglobin 12.9 (L) 13.0 - 17.0 g/dL   HCT 01.0 (L) 27.2 - 53.6 %  Magnesium   Collection Time: 03/31/24 10:28 PM  Result Value Ref Range   Magnesium 2.5 (H) 1.7 - 2.4 mg/dL      EKG None  Radiology CT ABDOMEN PELVIS W CONTRAST Result Date: 03/31/2024 CLINICAL DATA:  Abdomen pain no bowel movement EXAM: CT ABDOMEN AND PELVIS WITH CONTRAST TECHNIQUE: Multidetector CT imaging of the abdomen and pelvis was performed using the standard protocol following bolus administration of intravenous contrast. RADIATION DOSE REDUCTION: This exam was performed according to the departmental  dose-optimization program which includes automated exposure control, adjustment of the mA and/or kV according to patient size and/or use of iterative reconstruction technique. CONTRAST:  OMNIPAQUE IOHEXOL 300 MG/ML  SOLN COMPARISON:  Radiograph 03/31/2024, chest CT 03/26/2024, PET CT 03/24/2023 FINDINGS: Lower chest: Lung bases demonstrate no acute airspace disease. Cardiomegaly with coronary vascular calcifications. Small hiatal hernia Hepatobiliary: No focal liver abnormality is seen. No gallstones, gallbladder wall thickening, or biliary dilatation. Pancreas: Unremarkable. No pancreatic ductal dilatation or surrounding inflammatory changes. Spleen: Normal in size without focal abnormality. Adrenals/Urinary Tract: Adrenal glands are normal. Right renal cysts for which no imaging follow-up is recommended. No hydronephrosis. Bladder is unremarkable Stomach/Bowel: Stomach nonenlarged. Fluid-filled nondistended small bowel. Fluid within the right colon, extending to the descending colon. Diffuse diverticular disease of the colon without definitive inflammatory wall thickening. Large amount of stool in the sigmoid colon and rectum. Negative appendix Vascular/Lymphatic: Aortic atherosclerosis. No enlarged abdominal or pelvic lymph nodes. Reproductive: Negative for mass Other: Negative for free air. Trace fluid in the left lower quadrant Musculoskeletal: No acute or suspicious osseous abnormality. IMPRESSION: 1. Fluid-filled nondistended small bowel and fluid-filled colon to the level of the sigmoid colon, nonspecific in appearance, question a mild ileus. No obstructive pattern is visualized. There is no acute bowel wall thickening. Diffuse diverticular disease of the colon without definitive inflammatory wall thickening. Large amount of stool in the sigmoid colon and rectum. 2. Cardiomegaly. 3. Aortic atherosclerosis. Electronically Signed   By: Jasmine Pang M.D.   On:  03/31/2024 21:10   DG ABD ACUTE 2+V W 1V  CHEST Result Date: 03/31/2024 CLINICAL DATA:  Abdominal distension EXAM: DG ABDOMEN ACUTE WITH 1 VIEW CHEST COMPARISON:  Chest radiograph 02/20/2024 PET CT 03/24/2023 FINDINGS: Focal opacity seen within the left mid lung zone which appears stable since prior chest radiograph and large since remote prior PET CT examination of 03/24/2023. It is unclear whether this represents local disease progression versus is the sequela of therapy. Coronary artery bypass grafting has been performed. Cardiac size within normal limits. No pneumothorax or pleural effusion. Multiple nondilated gas-filled loops of large and small bowel are present demonstrating air-fluid levels at similar levels on upright examination suggesting changes of an underlying adynamic ileus. No free intraperitoneal gas. Osseous structures are age-appropriate. IMPRESSION: 1. Focal opacity within the left mid lung zone which appears stable since prior chest radiograph and large since remote prior PET CT examination of 03/24/2023. It is unclear whether this represents local disease progression versus is the sequela of therapy. 2. Adynamic ileus. Electronically Signed   By: Helyn Numbers M.D.   On: 03/31/2024 19:36    Procedures Procedures    Medications Ordered in ED Medications  potassium chloride (KLOR-CON) packet 40 mEq (has no administration in time range)  lactated ringers bolus 1,000 mL (has no administration in time range)  bisacodyl (DULCOLAX) suppository 10 mg (10 mg Rectal Given 03/31/24 1846)  morphine (PF) 2 MG/ML injection 2 mg (2 mg Intravenous Given 03/31/24 2024)  iohexol (OMNIPAQUE) 300 MG/ML solution 100 mL (100 mLs Intravenous Contrast Given 03/31/24 2039)    ED Course/ Medical Decision Making/ A&P                                 Medical Decision Making Problems Addressed: Hypokalemia: acute illness or injury Ileus [facility]): chronic illness or injury with exacerbation, progression, or side effects of treatment that poses a  threat to life or bodily functions Other constipation: acute illness or injury with systemic symptoms that poses a threat to life or bodily functions    Details: Acute on chronic  Amount and/or Complexity of Data Reviewed Independent Historian:     Details: Family, hx External Data Reviewed: notes. Labs: ordered. Decision-making details documented in ED Course. Radiology: ordered and independent interpretation performed. Decision-making details documented in ED Course.  Risk OTC drugs. Prescription drug management. Parenteral controlled substances. Decision regarding hospitalization.   Labs ordered/sent. Imaging ordered.   Differential diagnosis includes sbo, ileus, constipation, obstipation, etc. Dispo decision including potential need for admission considered - will get labs and imaging and reassess.   Reviewed nursing notes and prior charts for additional history. External reports reviewed. Additional history from: family.  Small amount soft stool manually disimpacted, pt does not tolerate further disimpaction/stool removal, requests termination of exam.   Dulcolax suppository.   Labs reviewed/interpreted by me - wbc 11, hgb 13. K mildly low. Mg added. Kcl po.   Xrays reviewed/interpreted by me - ?ileus.   Patient reports increased abd pain. Will get ct.  Ct reviewed/interpreted by me - no sbo. Ileus. Mod stool in rectum. Pt indicates enemas have worked in past, requests enema. Enema  ordered.   Pt notes takes oxycodone regularly at home, but has been thinking about decreasing use. I discussed that opiate meds can cause constipation/ileus and make worse. Pt indicates he plans to taper off use.   2305 enemas pending, ivf infusing.    Recheck,  large bm in ED. Pt notes resolution of his symptoms. Abd soft nt.   Pt appears stable for d/c.          Final Clinical Impression(s) / ED Diagnoses Final diagnoses:  Other constipation  Ileus (HCC)  Hypokalemia    Rx /  DC Orders ED Discharge Orders     None             Cathren Laine, MD 03/31/24 2320

## 2024-03-31 NOTE — ED Triage Notes (Signed)
 Pt stated that he has not had a BM for the last 4 weeks. Pt stated that this is a problem he has often. Pt has tried everything to help relieve himself. Pt is in pain and is unable to sit for a long period of time

## 2024-03-31 NOTE — Discharge Instructions (Signed)
 It was our pleasure to provide your ER care today - we hope that you feel better.  Drink plenty of fluids/stay well hydrated. Get adequate fiber in diet, you may try prune juice/prunes.  Make sure to minimize pain medication use as opiate type meds such as your oxycodone can cause constipation and make it worse. You may take colace (stool softener) 2x/day, and miralax (laxative) once per day as need - these medications are available over the counter.   Follow up with primary care doctor in the coming week.  For chronic constipation, also follow up with GI doctor in the next 1-2 weeks - call office Monday to arrange appointment.   From today's labs, your potassium level is low - eat plenty of fruits and vegetables, take potassium supplement as prescribed, and follow up with your doctor in 1 - 2 weeks.   Return to ER if worse, new symptoms, fevers, new or worsening or severe abdominal pain, persistent vomiting, or other concern.

## 2024-04-05 DIAGNOSIS — I5032 Chronic diastolic (congestive) heart failure: Secondary | ICD-10-CM | POA: Diagnosis not present

## 2024-04-05 DIAGNOSIS — K5903 Drug induced constipation: Secondary | ICD-10-CM | POA: Diagnosis not present

## 2024-04-05 DIAGNOSIS — F1120 Opioid dependence, uncomplicated: Secondary | ICD-10-CM | POA: Diagnosis not present

## 2024-04-05 DIAGNOSIS — M4302 Spondylolysis, cervical region: Secondary | ICD-10-CM | POA: Diagnosis not present

## 2024-04-05 DIAGNOSIS — T50905A Adverse effect of unspecified drugs, medicaments and biological substances, initial encounter: Secondary | ICD-10-CM | POA: Diagnosis not present

## 2024-04-05 DIAGNOSIS — K5909 Other constipation: Secondary | ICD-10-CM | POA: Diagnosis not present

## 2024-04-05 DIAGNOSIS — M48061 Spinal stenosis, lumbar region without neurogenic claudication: Secondary | ICD-10-CM | POA: Diagnosis not present

## 2024-04-05 DIAGNOSIS — J449 Chronic obstructive pulmonary disease, unspecified: Secondary | ICD-10-CM | POA: Diagnosis not present

## 2024-04-05 DIAGNOSIS — G894 Chronic pain syndrome: Secondary | ICD-10-CM | POA: Diagnosis not present

## 2024-04-08 NOTE — Progress Notes (Signed)
 Radiation Oncology         [phone] ________________________________  Name: Dustin [name] MRN: [id number]  Date: 04/09/2024  DOB: 1934/05/02  Follow-Up Visit Note  CC: Kathyleen Parkins, MD  Kathyleen Parkins, MD  No diagnosis found.  Diagnosis:  The encounter diagnosis was Primary malignant neoplasm of left lower lobe of lung.   PET positive solitary lung nodule in the left lower lung   Presumptive clinical Stage I non-small cell lung cancer  Interval Since Last Radiation: 11 months and 8 days   Indication for treatment: Curative       Radiation treatment dates: 04/26/23 through 05/02/23 Site/dose: Left Lung - 54 Gy delivered in 3 Fx at 18 Gy/Fx Beams/energy:  6X-FFF Technique/Mode: SBRT/SRT-IMRT, Photon  Narrative:  The patient returns today for routine follow-up and to review recent imaging. He was last seen here for follow-up on 09/26/23.     Since his last visit, the patient established care with Dr. Sueanne Emerald at [facility] on 01/16/24 (previously met with Dr. Matilde Son). During that time, the patient endorsed significant cough, particularly in the mornings, which would be productive of large amounts of mucus. With regards to his COPD, he was advised to continue taking his breztri BID. He was also advised to start taking mucinex to help with his productive cough, and to continue to use his supplemental O2 at 2L with activity and at bedtime.    He was also hospitalized in the interval from 02/20/24 through 02/24/24 after presenting to the ED with c/o cough, malaise, fatigue, myalgia as well as lethargy x 5 days. He ultimately tested positive for Influenza A and was admitted for management of associated COPD exacerbation with acute on chronic respiratory failure with hypoxia. (CXR performed while inpatient was negative for PNA or any acute findings, and showed suspected radiation changes in the LUL).         His most recent chest CT without contrast on 03/26/24 demonstrates no  definite evidence of local disease progression or metastatic disease, and post radiation changes within the LUL and superior segment of the LLL. Nonspecific mild right basilar ground-glass pulmonary infiltrate was also demonstrated, which may possibly represent an infectious or inflammatory etiology, vs post inflammatory fibrotic changes.   Of note: He was also recently seen in the ED on 03/31/24 for prolonged constipation (of fully formed stool) x 4 weeks, (with occasional liquid stools over that time period). CT AP performed showed a nonspecific appearing fluid-filled nondistended small bowel and fluid-filled colon to the level of the sigmoid colon, a large amount of stool in the sigmoid colon and rectum, and diffuse diverticular disease of the colon without definitive inflammatory wall thickening. No obstructive patterns were visualized overall. ED course consisted of an enema with relief achieved prior to discharge.      ***                   Allergies:  has no known allergies.  Meds: Current Outpatient Medications  Medication Sig Dispense Refill   albuterol (PROVENTIL) (2.5 MG/3ML) 0.083% nebulizer solution Take 3 mLs (2.5 mg total) by nebulization every 2 (two) hours as needed for wheezing or shortness of breath. 75 mL 3   albuterol (VENTOLIN HFA) 108 (90 Base) MCG/ACT inhaler Inhale 2 puffs into the lungs every 6 (six) hours as needed for wheezing or shortness of breath. 18 g 3   alfuzosin (UROXATRAL) 10 MG 24 hr tablet TAKE 1 TABLET DAILY WITH BREAKFAST. 30 tablet 0  aspirin EC 81 MG tablet Take 1 tablet (81 mg total) by mouth daily. Swallow whole.     Budeson-Glycopyrrol-Formoterol (BREZTRI AEROSPHERE) 160-9-4.8 MCG/ACT AERO Inhale 2 puffs into the lungs in the morning and at bedtime. 10.7 g 5   DULoxetine (CYMBALTA) 30 MG capsule Take 30 mg by mouth daily.     escitalopram (LEXAPRO) 20 MG tablet Take 20 mg by mouth every morning.      furosemide (LASIX) 20 MG tablet Take 1 tablet (20 mg  total) by mouth 2 (two) times daily. For fluid 60 tablet 5   gabapentin (NEURONTIN) 100 MG capsule Take 100 mg by mouth 3 (three) times daily.     isosorbide mononitrate (IMDUR) 120 MG 24 hr tablet Take 1 tablet (120 mg total) by mouth every morning. 30 tablet 11   levothyroxine (SYNTHROID) 75 MCG tablet Take 75 mcg by mouth daily.     meclizine (ANTIVERT) 25 MG tablet Take 25 mg by mouth 4 (four) times daily as needed for dizziness.     memantine (NAMENDA) 10 MG tablet Take 10 mg by mouth 2 (two) times daily.      nitroGLYCERIN (NITROSTAT) 0.4 MG SL tablet Place 1 tablet (0.4 mg total) under the tongue every 5 (five) minutes as needed for chest pain. 15 tablet 5   oxycodone (ROXICODONE) 30 MG immediate release tablet Take 30 mg by mouth every 6 (six) hours as needed for pain.     pantoprazole (PROTONIX) 40 MG tablet Take 1 tablet (40 mg total) by mouth 2 (two) times daily for 14 days, THEN 1 tablet (40 mg total) daily. 388 tablet 0   potassium chloride SA (KLOR-CON M) 20 MEQ tablet One po bid x 3 days, then one po once a day 15 tablet 0   promethazine (PHENERGAN) 25 MG tablet Take 25 mg by mouth 4 (four) times daily as needed for nausea or vomiting.     ranolazine (RANEXA) 500 MG 12 hr tablet Take 500 mg by mouth 2 (two) times daily.     risperiDONE (RISPERDAL) 0.25 MG tablet Take 0.25 mg by mouth at bedtime.     rosuvastatin (CRESTOR) 40 MG tablet Take 1 tablet (40 mg total) by mouth daily at 6 PM. 30 tablet 5   No current facility-administered medications for this encounter.    Physical Findings: The patient is in no acute distress. Patient is alert and oriented.  vitals were not taken for this visit. .  No significant changes. Lungs are clear to auscultation bilaterally. Heart has regular rate and rhythm. No palpable cervical, supraclavicular, or axillary adenopathy. Abdomen soft, non-tender, normal bowel sounds.   Lab Findings: Lab Results  Component Value Date   WBC 11.4 (H)  03/31/2024   HGB 12.9 (L) 03/31/2024   HCT 38.0 (L) 03/31/2024   MCV 92.5 03/31/2024   PLT 255 03/31/2024    Radiographic Findings: CT ABDOMEN PELVIS W CONTRAST Result Date: 03/31/2024 CLINICAL DATA:  Abdomen pain no bowel movement EXAM: CT ABDOMEN AND PELVIS WITH CONTRAST TECHNIQUE: Multidetector CT imaging of the abdomen and pelvis was performed using the standard protocol following bolus administration of intravenous contrast. RADIATION DOSE REDUCTION: This exam was performed according to the departmental dose-optimization program which includes automated exposure control, adjustment of the mA and/or kV according to patient size and/or use of iterative reconstruction technique. CONTRAST:  OMNIPAQUE IOHEXOL 300 MG/ML  SOLN COMPARISON:  Radiograph 03/31/2024, chest CT 03/26/2024, PET CT 03/24/2023 FINDINGS: Lower chest: Lung bases demonstrate no acute  airspace disease. Cardiomegaly with coronary vascular calcifications. Small hiatal hernia Hepatobiliary: No focal liver abnormality is seen. No gallstones, gallbladder wall thickening, or biliary dilatation. Pancreas: Unremarkable. No pancreatic ductal dilatation or surrounding inflammatory changes. Spleen: Normal in size without focal abnormality. Adrenals/Urinary Tract: Adrenal glands are normal. Right renal cysts for which no imaging follow-up is recommended. No hydronephrosis. Bladder is unremarkable Stomach/Bowel: Stomach nonenlarged. Fluid-filled nondistended small bowel. Fluid within the right colon, extending to the descending colon. Diffuse diverticular disease of the colon without definitive inflammatory wall thickening. Large amount of stool in the sigmoid colon and rectum. Negative appendix Vascular/Lymphatic: Aortic atherosclerosis. No enlarged abdominal or pelvic lymph nodes. Reproductive: Negative for mass Other: Negative for free air. Trace fluid in the left lower quadrant Musculoskeletal: No acute or suspicious osseous abnormality.  IMPRESSION: 1. Fluid-filled nondistended small bowel and fluid-filled colon to the level of the sigmoid colon, nonspecific in appearance, question a mild ileus. No obstructive pattern is visualized. There is no acute bowel wall thickening. Diffuse diverticular disease of the colon without definitive inflammatory wall thickening. Large amount of stool in the sigmoid colon and rectum. 2. Cardiomegaly. 3. Aortic atherosclerosis. Electronically Signed   By: Esmeralda Hedge M.D.   On: 03/31/2024 21:10   CT CHEST WO CONTRAST Result Date: 03/31/2024 CLINICAL DATA:  Non-small cell lung cancer (NSCLC), non-metastatic, assess treatment response * Tracking Code: BO * EXAM: CT CHEST WITHOUT CONTRAST TECHNIQUE: Multidetector CT imaging of the chest was performed following the standard protocol without IV contrast. RADIATION DOSE REDUCTION: This exam was performed according to the departmental dose-optimization program which includes automated exposure control, adjustment of the mA and/or kV according to patient size and/or use of iterative reconstruction technique. COMPARISON:  Chest CT 09/20/2023, 07/29/2023, PET CT 03/24/2023 FINDINGS: Cardiovascular: Status post coronary artery bypass grafting. Global cardiac size within normal limits. No pericardial effusion. Central pulmonary arteries are of normal caliber. Moderate atherosclerotic calcification within the thoracic aorta. Mediastinum/Nodes: No enlarged mediastinal or axillary lymph nodes. Thyroid gland, trachea, and esophagus demonstrate no significant findings. Lungs/Pleura: Mild emphysema. There is peripheral airspace infiltrate within the posterior segment of the left upper lobe and superior segment of the left lower lobe which spans the major fissure and encompasses the previously noted primary lesion, compatible with post radiation changes regionally. There is progressive mild left-sided volume loss. Mild right basilar ground-glass pulmonary infiltrate is nonspecific,  possibly infectious or inflammatory in the acute setting or reflective of post inflammatory fibrotic change. No pneumothorax or pleural effusion. No central obstructing lesion. Upper Abdomen: No acute abnormality. Visualized adrenal glands are unremarkable. Musculoskeletal: No acute bone abnormality. No lytic or blastic bone lesion. IMPRESSION: 1. Post radiation changes regionally within the left upper lobe and superior segment of the left lower lobe. No definite evidence of local disease progression or metastatic disease. 2. Mild right basilar ground-glass pulmonary infiltrate is nonspecific, possibly infectious or inflammatory in the acute setting or reflective of post inflammatory fibrotic change. 3. Status post coronary artery bypass grafting. Aortic Atherosclerosis (ICD10-I70.0) and Emphysema (ICD10-J43.9). Electronically Signed   By: Worthy Heads M.D.   On: 03/31/2024 19:48   DG ABD ACUTE 2+V W 1V CHEST Result Date: 03/31/2024 CLINICAL DATA:  Abdominal distension EXAM: DG ABDOMEN ACUTE WITH 1 VIEW CHEST COMPARISON:  Chest radiograph 02/20/2024 PET CT 03/24/2023 FINDINGS: Focal opacity seen within the left mid lung zone which appears stable since prior chest radiograph and large since remote prior PET CT examination of 03/24/2023. It is unclear whether this represents  local disease progression versus is the sequela of therapy. Coronary artery bypass grafting has been performed. Cardiac size within normal limits. No pneumothorax or pleural effusion. Multiple nondilated gas-filled loops of large and small bowel are present demonstrating air-fluid levels at similar levels on upright examination suggesting changes of an underlying adynamic ileus. No free intraperitoneal gas. Osseous structures are age-appropriate. IMPRESSION: 1. Focal opacity within the left mid lung zone which appears stable since prior chest radiograph and large since remote prior PET CT examination of 03/24/2023. It is unclear whether this  represents local disease progression versus is the sequela of therapy. 2. Adynamic ileus. Electronically Signed   By: Worthy Heads M.D.   On: 03/31/2024 19:36    Impression:  PET positive solitary lung nodule in the left lower lung   Presumptive clinical Stage I non-small cell lung cancer  The patient is recovering from the effects of radiation.  ***  Plan:  ***   *** minutes of total time was spent for this patient encounter, including preparation, face-to-face counseling with the patient and coordination of care, physical exam, and documentation of the encounter. ____________________________________  Noralee Beam, PhD, MD  This document serves as a record of services personally performed by Retta Caster, MD. It was created on his behalf by Aleta Anda, a trained medical scribe. The creation of this record is based on the scribe's personal observations and the provider's statements to them. This document has been checked and approved by the attending provider.

## 2024-04-09 ENCOUNTER — Ambulatory Visit
Admission: RE | Admit: 2024-04-09 | Discharge: 2024-04-09 | Disposition: A | Discharge status: Home / Self Care | Source: Ambulatory Visit | Attending: Radiation Oncology | Admitting: Radiation Oncology

## 2024-04-09 VITALS — BP 134/62 | HR 63 | Temp 97.5°F | Resp 20 | Ht 70.0 in | Wt 215.8 lb

## 2024-04-09 DIAGNOSIS — Z85118 Personal history of other malignant neoplasm of bronchus and lung: Secondary | ICD-10-CM | POA: Insufficient documentation

## 2024-04-09 DIAGNOSIS — Z923 Personal history of irradiation: Secondary | ICD-10-CM | POA: Diagnosis not present

## 2024-04-09 DIAGNOSIS — Z79890 Hormone replacement therapy (postmenopausal): Secondary | ICD-10-CM | POA: Diagnosis not present

## 2024-04-09 DIAGNOSIS — Z7982 Long term (current) use of aspirin: Secondary | ICD-10-CM | POA: Insufficient documentation

## 2024-04-09 DIAGNOSIS — K5730 Diverticulosis of large intestine without perforation or abscess without bleeding: Secondary | ICD-10-CM | POA: Insufficient documentation

## 2024-04-09 DIAGNOSIS — C3432 Malignant neoplasm of lower lobe, left bronchus or lung: Secondary | ICD-10-CM

## 2024-04-09 DIAGNOSIS — Z79899 Other long term (current) drug therapy: Secondary | ICD-10-CM | POA: Diagnosis not present

## 2024-04-09 DIAGNOSIS — I517 Cardiomegaly: Secondary | ICD-10-CM | POA: Insufficient documentation

## 2024-04-09 DIAGNOSIS — F17210 Nicotine dependence, cigarettes, uncomplicated: Secondary | ICD-10-CM | POA: Diagnosis not present

## 2024-04-09 DIAGNOSIS — I700 Atherosclerosis of aorta: Secondary | ICD-10-CM | POA: Diagnosis not present

## 2024-04-09 NOTE — Progress Notes (Signed)
 Dustin [name] is here today for follow up post radiation to the lung. He completed radiation on 05/02/2023   Lung Side: Left  Does the patient complain of any of the following: Pain: Continues to deal with lest sided axilla/chest discomfort. Reports it hasn't been bothering him for the past week, but when it does he has trouble using his left arm to push himself up into a standing position Shortness of breath w/wo exertion: SOB with minimal activity. Wears supplemental O2 (2L-[place]) at night Cough: Denies dry or productive cough Hemoptysis: Denies Pain with swallowing: Denies Swallowing/choking concerns: Denies Appetite: Reports a healthy appetite Energy Level: Fair Post radiation skin Changes: Denies   Additional comments if applicable: Nothing else of note

## 2024-04-11 ENCOUNTER — Ambulatory Visit: Admitting: Cardiology

## 2024-04-15 NOTE — Progress Notes (Signed)
 Dustin [name], male    DOB: December 21, 1934    MRN: [id number]   Brief patient profile:  85  yowm  quit smoking 1996 p MI  former [facility] patient self-referred back to pulmonary clinic in [facility]  04/16/2024  for cp/sob/ cough  - no pfts on file, no response to bronchodilators.  L CP onset a few weeks of finishing radiation/ just below L scapula more positional than pleuritic assoc with severe constipation (RT notes say pain reported same location p cabg = baseline, pt denies)   PET pos/ spn/ presumed ca  Indication for treatment: Curative       Radiation treatment dates: 04/26/23 through 05/02/23 Site/dose: Left Lung - 54 Gy delivered in 3 Fx at 18 Gy/Fx Beams/energy:  6X-FFF Technique/Mode: SBRT/SRT-IMRT, Photon    History of Present Illness  04/16/2024  Pulmonary/ 1st office eval/ Felicha Frayne / [place] [facility]  Chief Complaint  Patient presents with   COPD        Shortness of Breath  Dyspnea:  riding at grocery store x 6 m  Cough: none now / cp not worse diet cough / oxycodone  helps / has gabapentin  but only using 100 mg per day  Min production pale green  Sleep: 30 degrees HOB  SABA use: inhalers not much change in doe, helps him cough up mucus somewhat   02 use: 2lpm HS and prn     No obvious day to day or daytime pattern/variability or assoc mucus plugs or hemoptysis or  chest tightness, subjective wheeze or overt sinus or hb symptoms.    Also denies any obvious fluctuation of symptoms with weather or environmental changes or other aggravating or alleviating factors except as outlined above   No unusual exposure hx or h/o childhood pna/ asthma or knowledge of premature birth.  Current Allergies, Complete Past Medical History, Past Surgical History, Family History, and Social History were reviewed in [employer] record.  ROS  The following are not active complaints unless bolded Hoarseness, sore throat, dysphagia, dental problems, itching, sneezing,   nasal congestion or discharge of excess mucus or purulent secretions, ear ache,   fever, chills, sweats, unintended wt loss or wt gain, classically pleuritic or exertional cp,  orthopnea pnd or arm/hand swelling  or leg swelling, presyncope, palpitations, abdominal pain, anorexia, nausea, vomiting, diarrhea  or change in bowel habits or change in bladder habits, change in stools or change in urine, dysuria, hematuria,  rash, arthralgias, visual complaints, headache, numbness, weakness or ataxia or problems with walking or coordination,  change in mood or  memory.            Outpatient Medications Prior to Visit -  - NOTE:   Unable to verify as accurately reflecting what pt takes    Medication Sig Dispense Refill   clopidogrel  (PLAVIX ) 75 MG tablet Take 75 mg by mouth daily.     methylPREDNISolone  (MEDROL  DOSEPAK) 4 MG TBPK tablet Take by mouth as directed.     QUEtiapine  (SEROQUEL ) 25 MG tablet Take 25 mg by mouth at bedtime as needed.     albuterol  (PROVENTIL ) (2.5 MG/3ML) 0.083% nebulizer solution Take 3 mLs (2.5 mg total) by nebulization every 2 (two) hours as needed for wheezing or shortness of breath. 75 mL 3   albuterol  (VENTOLIN  HFA) 108 (90 Base) MCG/ACT inhaler Inhale 2 puffs into the lungs every 6 (six) hours as needed for wheezing or shortness of breath. 18 g 3   alfuzosin  (UROXATRAL ) 10 MG 24  hr tablet TAKE 1 TABLET DAILY WITH BREAKFAST. 30 tablet 0   aspirin  EC 81 MG tablet Take 1 tablet (81 mg total) by mouth daily. Swallow whole.     Budeson-Glycopyrrol-Formoterol  (BREZTRI  AEROSPHERE) 160-9-4.8 MCG/ACT AERO Inhale 2 puffs into the lungs in the morning and at bedtime. 10.7 g 5   DULoxetine  (CYMBALTA ) 30 MG capsule Take 30 mg by mouth daily.     escitalopram  (LEXAPRO ) 20 MG tablet Take 20 mg by mouth every morning.      furosemide  (LASIX ) 20 MG tablet Take 1 tablet (20 mg total) by mouth 2 (two) times daily. For fluid 60 tablet 5   gabapentin  (NEURONTIN ) 100 MG capsule Take 100 mg by  mouth 3 (three) times daily.     isosorbide  mononitrate (IMDUR ) 120 MG 24 hr tablet Take 1 tablet (120 mg total) by mouth every morning. 30 tablet 11   levothyroxine  (SYNTHROID ) 75 MCG tablet Take 75 mcg by mouth daily.     meclizine (ANTIVERT) 25 MG tablet Take 25 mg by mouth 4 (four) times daily as needed for dizziness.     memantine  (NAMENDA ) 10 MG tablet Take 10 mg by mouth 2 (two) times daily.      nitroGLYCERIN  (NITROSTAT ) 0.4 MG SL tablet Place 1 tablet (0.4 mg total) under the tongue every 5 (five) minutes as needed for chest pain. 15 tablet 5   oxycodone  (ROXICODONE ) 30 MG immediate release tablet Take 30 mg by mouth every 6 (six) hours as needed for pain.     pantoprazole  (PROTONIX ) 40 MG tablet Take 1 tablet (40 mg total) by mouth 2 (two) times daily for 14 days, THEN 1 tablet (40 mg total) daily. 388 tablet 0   potassium chloride  SA (KLOR-CON  M) 20 MEQ tablet One po bid x 3 days, then one po once a day 15 tablet 0   promethazine  (PHENERGAN ) 25 MG tablet Take 25 mg by mouth 4 (four) times daily as needed for nausea or vomiting.     ranolazine  (RANEXA ) 500 MG 12 hr tablet Take 500 mg by mouth 2 (two) times daily.     risperiDONE  (RISPERDAL ) 0.25 MG tablet Take 0.25 mg by mouth at bedtime.     rosuvastatin  (CRESTOR ) 40 MG tablet Take 1 tablet (40 mg total) by mouth daily at 6 PM. 30 tablet 5   No facility-administered medications prior to visit.    Past Medical History:  Diagnosis Date   Alzheimer disease (HCC)    Anxiety    Arthritis    Atrophic gastritis    a. By EGD 02/2013.   Carotid artery disease (HCC)    a. mild-mod plaque, <50% stenosis bilat by duplex 2018.   Coronary atherosclerosis of native coronary artery    a. Multivessel s/p CABG 1996. b. DESx2 to SVG-PDA in 2012 c. DES to distal LM in 11/2019 d. cath in 02/2021 showing patent LM stent, patent LIMA-LAD, patent SVG-OM1 with chronically occluded jump limb to OM2 and chronic occlusion of SVG-D2 and SVG-PDA and no  targets for intervention   DDD (degenerative disc disease)    Chronic back pain   Dementia (HCC)    Enlarged prostate    Essential hypertension    Hematuria    History of radiation therapy    Left Lung- 04/26/23-Dr. Retta Caster   Hypothyroidism    LBBB (left bundle branch block)    MI (myocardial infarction) (HCC)    Mixed hyperlipidemia    OA (osteoarthritis)    OSA (obstructive sleep  apnea)    Pneumonia due to COVID-19 virus    February 2021   Sinus bradycardia    a. Aricept  and BB discontinued due to this.      Objective:     BP (!) 96/58   Pulse 81   Ht 5\' 10"  (1.778 m)   Wt 214 lb (97.1 kg)   SpO2 (!) 89% Comment: RA  BMI 30.71 kg/m   SpO2: (!) 89 % (RA)  Amb mod obese chronically ill appearing wm nad, slt unsteady on his feet   HEENT : Oropharynx  clear      Nasal turbinates nl    NECK :  without  apparent JVD/ palpable Nodes/TM    LUNGS: no acc muscle use,  Nl contour chest which is clear to A and P bilaterally without cough on insp or exp maneuvers   CV:  RRR  no s3 or murmur or increase in P2, and no edema   ABD:  obese soft and nontender   MS:   ext warm without deformities Or obvious joint restrictions  calf tenderness, cyanosis or clubbing    SKIN: warm and dry without lesions    NEURO:  alert, approp, nl sensorium with  no motor  deficits    I personally reviewed images and agree with radiology impression as follows:   Chest CT w/o contrast    03/26/24  1. Post radiation changes regionally within the left upper lobe and superior segment of the left lower lobe. No definite evidence of local disease progression or metastatic disease. 2. Mild right basilar ground-glass pulmonary infiltrate is nonspecific, possibly infectious or inflammatory in the acute setting or reflective of post inflammatory fibrotic change. 3. Status post coronary artery bypass grafting.      Assessment   Chronic chest wall pain Reports dates back to RT completed  10/2023 L post segment nodule (RT notes say related to cabg) - 04/16/2024 trial of citrcel/ gabapentin  and zostrix >>> see avs for instructions unique to this ov   Pain is non specific and most suggestive to me of IBS except that it localizes posteriorly but is assoc with abd bloating and constipation so will rx as IBS with citrucel and chest wall pain with gabapentin  nad zostrix and f/u in 6 weeks  with all meds in hand using a trust but verify approach to confirm accurate Medication  Reconciliation The principal here is that until we are certain that the  patients are doing what we've asked, it makes no sense to ask them to do more.   Chronic respiratory failure with hypoxia (HCC) Rx as of 04/16/2024    = 2lpm hs and prn daytime   Advised:  Make sure you check your oxygen  saturation  AT  your highest level of activity (not after you stop)   to be sure it stays over 90% and adjust  02 flow upward to maintain this level if needed but remember to turn it back to previous settings when you stop (to conserve your supply).   F/u in 6 weeks with 02 rx          Each maintenance medication was reviewed in detail including emphasizing most importantly the difference between maintenance and prns and under what circumstances the prns are to be triggered using an action plan format where appropriate.  Total time for H and P, chart review, counseling, reviewing 02/ pulse ox  device(s) and generating customized AVS unique to this office visit / same day charting =  45 min with  pt new to me with    refractory chest  symptoms of uncertain etiology           Vernestine Gondola, MD 04/16/2024

## 2024-04-16 ENCOUNTER — Ambulatory Visit: Payer: Medicare HMO | Admitting: Internal Medicine

## 2024-04-16 ENCOUNTER — Encounter: Payer: Self-pay | Admitting: Internal Medicine

## 2024-04-16 VITALS — BP 96/58 | HR 81 | Ht 70.0 in | Wt 214.0 lb

## 2024-04-16 DIAGNOSIS — G8929 Other chronic pain: Secondary | ICD-10-CM

## 2024-04-16 DIAGNOSIS — R0789 Other chest pain: Secondary | ICD-10-CM

## 2024-04-16 DIAGNOSIS — J9611 Chronic respiratory failure with hypoxia: Secondary | ICD-10-CM | POA: Insufficient documentation

## 2024-04-16 NOTE — Assessment & Plan Note (Addendum)
 Rx as of 04/16/2024    = 2lpm hs and prn daytime   Advised:  Make sure you check your oxygen  saturation  AT  your highest level of activity (not after you stop)   to be sure it stays over 90% and adjust  02 flow upward to maintain this level if needed but remember to turn it back to previous settings when you stop (to conserve your supply).   F/u in 6 weeks with 02 rx          Each maintenance medication was reviewed in detail including emphasizing most importantly the difference between maintenance and prns and under what circumstances the prns are to be triggered using an action plan format where appropriate.  Total time for H and P, chart review, counseling, reviewing 02/ pulse ox  device(s) and generating customized AVS unique to this office visit / same day charting = 45 min with  pt new to me with    refractory chest  symptoms of uncertain etiology

## 2024-04-16 NOTE — Assessment & Plan Note (Addendum)
 Reports dates back to RT completed 10/2023 L post segment nodule (RT notes say related to cabg) - 04/16/2024 trial of citrcel/ gabapentin  and zostrix >>> see avs for instructions unique to this ov   Pain is non specific and most suggestive to me of IBS except that it localizes posteriorly but is assoc with abd bloating and constipation so will rx as IBS with citrucel and chest wall pain with gabapentin  nad zostrix and f/u in 6 weeks  with all meds in hand using a trust but verify approach to confirm accurate Medication  Reconciliation The principal here is that until we are certain that the  patients are doing what we've asked, it makes no sense to ask them to do more.

## 2024-04-16 NOTE — Patient Instructions (Addendum)
 Pain pattern suggests ibs:  Stereotypical  with a very limited distribution of pain locations, daytime, not usually exacerbated by exercise  worse with coughing or  in sitting position, frequently associated with generalized abd bloating, not as likely to be present supine due to the dome effect of the diaphragm which  is  canceled in that position.    Treatment consists of avoiding foods that cause gas (especially boiled eggs, mexcican food but especially  beans and undercooked vegetables like  spinach and some salads)  and citrucel 1 heaping tbsp twice daily with a large glass of water .  Pain should improve w/in 2 weeks and if not then consider further GI work up.     Gabapentin  100 mg four times daily for now to see if it results in less needed for oxycodone   Zostrix cream can be applied just on the area of the pain 4 x daily   Make sure you check your oxygen  saturation  AT  your highest level of activity (not after you stop)   to be sure it stays over 90% and adjust  02 flow upward to maintain this level if needed but remember to turn it back to previous settings when you stop (to conserve your supply).   Please schedule a follow up office visit in 6 weeks, call sooner if needed

## 2024-04-18 DIAGNOSIS — F0284 Dementia in other diseases classified elsewhere, unspecified severity, with anxiety: Secondary | ICD-10-CM | POA: Diagnosis not present

## 2024-04-18 DIAGNOSIS — F0283 Dementia in other diseases classified elsewhere, unspecified severity, with mood disturbance: Secondary | ICD-10-CM | POA: Diagnosis not present

## 2024-04-18 DIAGNOSIS — I10 Essential (primary) hypertension: Secondary | ICD-10-CM | POA: Diagnosis not present

## 2024-04-18 DIAGNOSIS — I252 Old myocardial infarction: Secondary | ICD-10-CM | POA: Diagnosis not present

## 2024-04-18 DIAGNOSIS — J441 Chronic obstructive pulmonary disease with (acute) exacerbation: Secondary | ICD-10-CM | POA: Diagnosis not present

## 2024-04-18 DIAGNOSIS — F32A Depression, unspecified: Secondary | ICD-10-CM | POA: Diagnosis not present

## 2024-04-18 DIAGNOSIS — G309 Alzheimer's disease, unspecified: Secondary | ICD-10-CM | POA: Diagnosis not present

## 2024-04-18 DIAGNOSIS — I2510 Atherosclerotic heart disease of native coronary artery without angina pectoris: Secondary | ICD-10-CM | POA: Diagnosis not present

## 2024-04-18 DIAGNOSIS — J9621 Acute and chronic respiratory failure with hypoxia: Secondary | ICD-10-CM | POA: Diagnosis not present

## 2024-04-23 ENCOUNTER — Encounter: Payer: Self-pay | Admitting: Cardiology

## 2024-04-23 ENCOUNTER — Ambulatory Visit: Attending: Cardiology | Admitting: Cardiology

## 2024-04-25 DIAGNOSIS — F0283 Dementia in other diseases classified elsewhere, unspecified severity, with mood disturbance: Secondary | ICD-10-CM | POA: Diagnosis not present

## 2024-04-25 DIAGNOSIS — G309 Alzheimer's disease, unspecified: Secondary | ICD-10-CM | POA: Diagnosis not present

## 2024-04-25 DIAGNOSIS — J9621 Acute and chronic respiratory failure with hypoxia: Secondary | ICD-10-CM | POA: Diagnosis not present

## 2024-04-25 DIAGNOSIS — F32A Depression, unspecified: Secondary | ICD-10-CM | POA: Diagnosis not present

## 2024-04-25 DIAGNOSIS — I2510 Atherosclerotic heart disease of native coronary artery without angina pectoris: Secondary | ICD-10-CM | POA: Diagnosis not present

## 2024-04-25 DIAGNOSIS — F0284 Dementia in other diseases classified elsewhere, unspecified severity, with anxiety: Secondary | ICD-10-CM | POA: Diagnosis not present

## 2024-04-25 DIAGNOSIS — I10 Essential (primary) hypertension: Secondary | ICD-10-CM | POA: Diagnosis not present

## 2024-04-25 DIAGNOSIS — I252 Old myocardial infarction: Secondary | ICD-10-CM | POA: Diagnosis not present

## 2024-04-25 DIAGNOSIS — J441 Chronic obstructive pulmonary disease with (acute) exacerbation: Secondary | ICD-10-CM | POA: Diagnosis not present

## 2024-05-01 DIAGNOSIS — F0283 Dementia in other diseases classified elsewhere, unspecified severity, with mood disturbance: Secondary | ICD-10-CM | POA: Diagnosis not present

## 2024-05-01 DIAGNOSIS — J9621 Acute and chronic respiratory failure with hypoxia: Secondary | ICD-10-CM | POA: Diagnosis not present

## 2024-05-01 DIAGNOSIS — F0284 Dementia in other diseases classified elsewhere, unspecified severity, with anxiety: Secondary | ICD-10-CM | POA: Diagnosis not present

## 2024-05-01 DIAGNOSIS — G309 Alzheimer's disease, unspecified: Secondary | ICD-10-CM | POA: Diagnosis not present

## 2024-05-01 DIAGNOSIS — I10 Essential (primary) hypertension: Secondary | ICD-10-CM | POA: Diagnosis not present

## 2024-05-01 DIAGNOSIS — I2510 Atherosclerotic heart disease of native coronary artery without angina pectoris: Secondary | ICD-10-CM | POA: Diagnosis not present

## 2024-05-01 DIAGNOSIS — I252 Old myocardial infarction: Secondary | ICD-10-CM | POA: Diagnosis not present

## 2024-05-01 DIAGNOSIS — F32A Depression, unspecified: Secondary | ICD-10-CM | POA: Diagnosis not present

## 2024-05-01 DIAGNOSIS — J441 Chronic obstructive pulmonary disease with (acute) exacerbation: Secondary | ICD-10-CM | POA: Diagnosis not present

## 2024-05-04 DIAGNOSIS — M4302 Spondylolysis, cervical region: Secondary | ICD-10-CM | POA: Diagnosis not present

## 2024-05-04 DIAGNOSIS — E6609 Other obesity due to excess calories: Secondary | ICD-10-CM | POA: Diagnosis not present

## 2024-05-04 DIAGNOSIS — Z6831 Body mass index (BMI) 31.0-31.9, adult: Secondary | ICD-10-CM | POA: Diagnosis not present

## 2024-05-04 DIAGNOSIS — G894 Chronic pain syndrome: Secondary | ICD-10-CM | POA: Diagnosis not present

## 2024-05-04 DIAGNOSIS — M48061 Spinal stenosis, lumbar region without neurogenic claudication: Secondary | ICD-10-CM | POA: Diagnosis not present

## 2024-05-04 DIAGNOSIS — F1120 Opioid dependence, uncomplicated: Secondary | ICD-10-CM | POA: Diagnosis not present

## 2024-05-04 DIAGNOSIS — I5022 Chronic systolic (congestive) heart failure: Secondary | ICD-10-CM | POA: Diagnosis not present

## 2024-05-04 DIAGNOSIS — J449 Chronic obstructive pulmonary disease, unspecified: Secondary | ICD-10-CM | POA: Diagnosis not present

## 2024-05-17 ENCOUNTER — Emergency Department (HOSPITAL_COMMUNITY)

## 2024-05-17 ENCOUNTER — Other Ambulatory Visit: Payer: Self-pay

## 2024-05-17 ENCOUNTER — Encounter (HOSPITAL_COMMUNITY): Payer: Self-pay

## 2024-05-17 ENCOUNTER — Emergency Department (HOSPITAL_COMMUNITY)
Admission: EM | Admit: 2024-05-17 | Discharge: 2024-05-17 | Disposition: A | Discharge status: Home / Self Care | Attending: Emergency Medicine | Admitting: Emergency Medicine

## 2024-05-17 DIAGNOSIS — I5032 Chronic diastolic (congestive) heart failure: Secondary | ICD-10-CM | POA: Insufficient documentation

## 2024-05-17 DIAGNOSIS — F1190 Opioid use, unspecified, uncomplicated: Secondary | ICD-10-CM | POA: Diagnosis not present

## 2024-05-17 DIAGNOSIS — Z7982 Long term (current) use of aspirin: Secondary | ICD-10-CM | POA: Insufficient documentation

## 2024-05-17 DIAGNOSIS — N281 Cyst of kidney, acquired: Secondary | ICD-10-CM | POA: Diagnosis not present

## 2024-05-17 DIAGNOSIS — F0280 Dementia in other diseases classified elsewhere without behavioral disturbance: Secondary | ICD-10-CM | POA: Diagnosis not present

## 2024-05-17 DIAGNOSIS — Z85118 Personal history of other malignant neoplasm of bronchus and lung: Secondary | ICD-10-CM | POA: Diagnosis not present

## 2024-05-17 DIAGNOSIS — I2510 Atherosclerotic heart disease of native coronary artery without angina pectoris: Secondary | ICD-10-CM | POA: Diagnosis not present

## 2024-05-17 DIAGNOSIS — R339 Retention of urine, unspecified: Secondary | ICD-10-CM | POA: Diagnosis not present

## 2024-05-17 DIAGNOSIS — Z7902 Long term (current) use of antithrombotics/antiplatelets: Secondary | ICD-10-CM | POA: Insufficient documentation

## 2024-05-17 DIAGNOSIS — I110 Hypertensive heart disease with heart failure: Secondary | ICD-10-CM | POA: Insufficient documentation

## 2024-05-17 DIAGNOSIS — E039 Hypothyroidism, unspecified: Secondary | ICD-10-CM | POA: Diagnosis not present

## 2024-05-17 DIAGNOSIS — J449 Chronic obstructive pulmonary disease, unspecified: Secondary | ICD-10-CM | POA: Insufficient documentation

## 2024-05-17 DIAGNOSIS — Z79891 Long term (current) use of opiate analgesic: Secondary | ICD-10-CM

## 2024-05-17 DIAGNOSIS — K5730 Diverticulosis of large intestine without perforation or abscess without bleeding: Secondary | ICD-10-CM | POA: Diagnosis not present

## 2024-05-17 DIAGNOSIS — G309 Alzheimer's disease, unspecified: Secondary | ICD-10-CM | POA: Insufficient documentation

## 2024-05-17 DIAGNOSIS — Z951 Presence of aortocoronary bypass graft: Secondary | ICD-10-CM | POA: Diagnosis not present

## 2024-05-17 DIAGNOSIS — Z87891 Personal history of nicotine dependence: Secondary | ICD-10-CM | POA: Diagnosis not present

## 2024-05-17 DIAGNOSIS — K5900 Constipation, unspecified: Secondary | ICD-10-CM | POA: Insufficient documentation

## 2024-05-17 DIAGNOSIS — K828 Other specified diseases of gallbladder: Secondary | ICD-10-CM | POA: Diagnosis not present

## 2024-05-17 LAB — CBC WITH DIFFERENTIAL/PLATELET
Abs Immature Granulocytes: 0.04 10*3/uL (ref 0.00–0.07)
Basophils Absolute: 0 10*3/uL (ref 0.0–0.1)
Basophils Relative: 0 %
Eosinophils Absolute: 0 10*3/uL (ref 0.0–0.5)
Eosinophils Relative: 0 %
HCT: 40 % (ref 39.0–52.0)
Hemoglobin: 13.4 g/dL (ref 13.0–17.0)
Immature Granulocytes: 0 %
Lymphocytes Relative: 13 %
Lymphs Abs: 1.4 10*3/uL (ref 0.7–4.0)
MCH: 31.4 pg (ref 26.0–34.0)
MCHC: 33.5 g/dL (ref 30.0–36.0)
MCV: 93.7 fL (ref 80.0–100.0)
Monocytes Absolute: 0.7 10*3/uL (ref 0.1–1.0)
Monocytes Relative: 7 %
Neutro Abs: 8.3 10*3/uL — ABNORMAL HIGH (ref 1.7–7.7)
Neutrophils Relative %: 80 %
Platelets: 270 10*3/uL (ref 150–400)
RBC: 4.27 MIL/uL (ref 4.22–5.81)
RDW: 13.6 % (ref 11.5–15.5)
WBC: 10.5 10*3/uL (ref 4.0–10.5)
nRBC: 0 % (ref 0.0–0.2)

## 2024-05-17 LAB — URINALYSIS, ROUTINE W REFLEX MICROSCOPIC
Bacteria, UA: NONE SEEN
Bilirubin Urine: NEGATIVE
Glucose, UA: 50 mg/dL — AB
Hgb urine dipstick: NEGATIVE
Ketones, ur: 5 mg/dL — AB
Leukocytes,Ua: NEGATIVE
Nitrite: NEGATIVE
Protein, ur: 30 mg/dL — AB
Specific Gravity, Urine: 1.015 (ref 1.005–1.030)
pH: 5 (ref 5.0–8.0)

## 2024-05-17 LAB — BASIC METABOLIC PANEL WITH GFR
Anion gap: 8 (ref 5–15)
BUN: 18 mg/dL (ref 8–23)
CO2: 29 mmol/L (ref 22–32)
Calcium: 9 mg/dL (ref 8.9–10.3)
Chloride: 98 mmol/L (ref 98–111)
Creatinine, Ser: 0.98 mg/dL (ref 0.61–1.24)
GFR, Estimated: >60 mL/min (ref >60)
Glucose, Bld: 147 mg/dL — ABNORMAL HIGH (ref 70–99)
Potassium: 3.4 mmol/L — ABNORMAL LOW (ref 3.5–5.1)
Sodium: 135 mmol/L (ref 135–145)

## 2024-05-17 MED ORDER — ONDANSETRON HCL 4 MG/2ML IJ SOLN
4.0000 mg | Freq: Once | INTRAMUSCULAR | Status: AC
  Administered 2024-05-17: 4 mg via INTRAVENOUS
  Filled 2024-05-17: qty 2

## 2024-05-17 MED ORDER — FENTANYL CITRATE PF 50 MCG/ML IJ SOSY
50.0000 ug | PREFILLED_SYRINGE | Freq: Once | INTRAMUSCULAR | Status: AC
  Administered 2024-05-17: 50 ug via INTRAVENOUS
  Filled 2024-05-17: qty 1

## 2024-05-17 MED ORDER — IOHEXOL 300 MG/ML  SOLN
100.0000 mL | Freq: Once | INTRAMUSCULAR | Status: AC | PRN
  Administered 2024-05-17: 100 mL via INTRAVENOUS

## 2024-05-17 MED ORDER — TAMSULOSIN HCL 0.4 MG PO CAPS: 0.4000 mg | ORAL_CAPSULE | Freq: Every day | ORAL | 0 refills | Status: DC

## 2024-05-17 MED ORDER — FLEET ENEMA RE ENEM
1.0000 | ENEMA | Freq: Once | RECTAL | Status: AC
  Administered 2024-05-17: 1 via RECTAL

## 2024-05-17 NOTE — ED Notes (Signed)
 Pt states they believe their prostate is so big that it's causing all of pt's problems like pain in their back, difficulty urinating and have BM.

## 2024-05-17 NOTE — Discharge Instructions (Addendum)
 It was a pleasure caring for you today in the emergency department.  Your opiate medication is likely making your constipation worse, consider reducing use of this medication in coordination with your PCP  Please follow up with urology in next 2 weeks  Please follow up with your primary care doctor within 2-3 days. For constipation we also recommend a diet high in fiber (beans, fruits, vegetables, whole grains). Take Colace 100-200 mg up to three times per day. You may take along with Senokot 1-2 tabs, ingest with full glass of water .  You may also take MiraLAX  1-2 capfuls 1-2 times a day until stools become soft and then slowly decrease the amount of MiraLAX  used.  Maintain fluid intake 6-8 glasses per day. Please increase fibers in your diet. You may also take Milk of Magnesia 30 mL as needed for constipation, you may repeat in 2 hours again if no bowl movement.  Please return to the emergency department for any worsening or worrisome symptoms.

## 2024-05-17 NOTE — ED Notes (Signed)
 Pt states they wear 2L [place] at night. Pt's O2 was 92%. Pt placed on 2L [place]

## 2024-05-17 NOTE — ED Notes (Signed)
 Using restroom at this time. Unable to get vitals.

## 2024-05-17 NOTE — ED Triage Notes (Signed)
 Pov form home. Cc of urinary retention and constipation. Has not had a normal BM in over a month.  Both retention and constipation happen frequently.

## 2024-05-17 NOTE — ED Provider Notes (Signed)
 [place] [facility] AT [facility] Provider Note  CSN: [id number] Arrival date & time: 05/17/24 0865  Chief Complaint(s) Constipation (1 month ) and Urinary Retention  HPI Dustin [name] is a 89 y.o. male with past medical history as below, significant for Alzheimer's disease, BPH, CAD, CABG 1996, dementia, HLD, OSA who presents to the ED with complaint of urinary retention, constipation  Reports constipation over the past month, has not tried any laxatives.  Last bowel movement was few days ago.  Having difficulty urinating, has issues with enlarged prostate she has been worsening.  Unable to void last night.  Significant pain to suprapubic area.  Had some nausea but no vomiting.  Has some generalized abdominal discomfort.  No fevers or chills, no chest pain or dyspnea.  No longer follows with urology  Past Medical History Past Medical History:  Diagnosis Date   Alzheimer disease (HCC)    Anxiety    Arthritis    Atrophic gastritis    a. By EGD 02/2013.   Carotid artery disease (HCC)    a. mild-mod plaque, <50% stenosis bilat by duplex 2018.   Coronary atherosclerosis of native coronary artery    a. Multivessel s/p CABG 1996. b. DESx2 to SVG-PDA in 2012 c. DES to distal LM in 11/2019 d. cath in 02/2021 showing patent LM stent, patent LIMA-LAD, patent SVG-OM1 with chronically occluded jump limb to OM2 and chronic occlusion of SVG-D2 and SVG-PDA and no targets for intervention   DDD (degenerative disc disease)    Chronic back pain   Dementia (HCC)    Enlarged prostate    Essential hypertension    Hematuria    History of radiation therapy    Left Lung- 04/26/23-Dr. Retta Caster   Hypothyroidism    LBBB (left bundle branch block)    MI (myocardial infarction) (HCC)    Mixed hyperlipidemia    OA (osteoarthritis)    OSA (obstructive sleep apnea)    Pneumonia due to COVID-19 virus    February 2021   Sinus bradycardia    a. Aricept  and BB discontinued due to  this.   Patient Active Problem List   Diagnosis Date Noted   Chronic respiratory failure with hypoxia (HCC) 04/16/2024   Obesity, Class I, BMI 30-34.9 02/22/2024   COPD with acute exacerbation (HCC) 02/21/2024   Influenza A 02/21/2024   Primary malignant neoplasm of left lower lobe of lung (HCC) 04/05/2023   Dysuria 10/24/2020   COPD (chronic obstructive pulmonary disease) (HCC) 12/10/2019   Fall 11/07/2018   Depression 11/07/2018   Benign prostatic hyperplasia with urinary obstruction 11/07/2018   Chronic pain 11/07/2018   Pressure injury of skin 02/15/2017   Chronic diastolic CHF (congestive heart failure) (HCC) 12/24/2016   Sinus bradycardia 12/23/2016   Foot pain, bilateral 12/23/2016   Hypokalemia 04/05/2016   Hypothyroidism 04/03/2016   Dementia (HCC) 04/03/2016   Acute on chronic respiratory failure with hypoxia (HCC) - on nocturnal O2 @ 2 L/min. 04/03/2016   Memory loss 04/20/2015   Neck pain on right side 08/29/2014   Neck pain 08/29/2014   Left-sided weakness 06/28/2014   Chronic chest wall pain 06/27/2014   Tubular adenoma of colon 03/05/2013   Anorexia 03/05/2013   Loss of weight 03/05/2013   Chronic constipation 07/04/2012   Hyperlipidemia 10/01/2009   OSA (obstructive sleep apnea) 10/01/2009   Alzheimer disease (HCC) 10/01/2009   HYPERTENSION, BENIGN 10/01/2009   Coronary artery disease - not on betablocker due to baseline bradycardia. Documented by  cardiology 10/01/2009   Home Medication(s) Prior to Admission medications   Medication Sig Start Date End Date Taking? Authorizing Provider  tamsulosin  (FLOMAX ) 0.4 MG CAPS capsule Take 1 capsule (0.4 mg total) by mouth daily for 14 days. 05/17/24 05/31/24 Yes Teddi Favors, DO  albuterol  (PROVENTIL ) (2.5 MG/3ML) 0.083% nebulizer solution Take 3 mLs (2.5 mg total) by nebulization every 2 (two) hours as needed for wheezing or shortness of breath. 11/24/22   Colin Dawley, MD  albuterol  (VENTOLIN  HFA) 108 (90 Base)  MCG/ACT inhaler Inhale 2 puffs into the lungs every 6 (six) hours as needed for wheezing or shortness of breath. 11/24/22   Colin Dawley, MD  aspirin  EC 81 MG tablet Take 1 tablet (81 mg total) by mouth daily. Swallow whole. 02/24/24   Unk Garb, DO  Budeson-Glycopyrrol-Formoterol  (BREZTRI  AEROSPHERE) 160-9-4.8 MCG/ACT AERO Inhale 2 puffs into the lungs in the morning and at bedtime. 03/16/23   Sood, Vineet, MD  clopidogrel  (PLAVIX ) 75 MG tablet Take 75 mg by mouth daily. 03/14/24   [provider]  DULoxetine  (CYMBALTA ) 30 MG capsule Take 30 mg by mouth daily. 05/10/23   [provider]  escitalopram  (LEXAPRO ) 20 MG tablet Take 20 mg by mouth every morning.     [provider]  furosemide  (LASIX ) 20 MG tablet Take 1 tablet (20 mg total) by mouth 2 (two) times daily. For fluid 11/24/22   Colin Dawley, MD  gabapentin  (NEURONTIN ) 100 MG capsule Take 100 mg by mouth 3 (three) times daily. 03/04/23   [provider]  isosorbide  mononitrate (IMDUR ) 120 MG 24 hr tablet Take 1 tablet (120 mg total) by mouth every morning. 11/24/22   Colin Dawley, MD  levothyroxine  (SYNTHROID ) 75 MCG tablet Take 75 mcg by mouth daily. 02/23/22   [provider]  meclizine (ANTIVERT) 25 MG tablet Take 25 mg by mouth 4 (four) times daily as needed for dizziness. 12/12/23   [provider]  memantine  (NAMENDA ) 10 MG tablet Take 10 mg by mouth 2 (two) times daily.  03/05/16   [provider]  methylPREDNISolone  (MEDROL  DOSEPAK) 4 MG TBPK tablet Take by mouth as directed. 03/02/24   [provider]  nitroGLYCERIN  (NITROSTAT ) 0.4 MG SL tablet Place 1 tablet (0.4 mg total) under the tongue every 5 (five) minutes as needed for chest pain. 11/24/22   Colin Dawley, MD  oxycodone  (ROXICODONE ) 30 MG immediate release tablet Take 30 mg by mouth every 6 (six) hours as needed for pain. 09/02/21   [provider]  pantoprazole  (PROTONIX ) 40 MG tablet Take 1  tablet (40 mg total) by mouth 2 (two) times daily for 14 days, THEN 1 tablet (40 mg total) daily. 02/14/23 02/28/24  Lasalle Pointer, NP  potassium chloride  SA (KLOR-CON  M) 20 MEQ tablet One po bid x 3 days, then one po once a day 03/31/24   Steinl, Kevin, MD  promethazine  (PHENERGAN ) 25 MG tablet Take 25 mg by mouth 4 (four) times daily as needed for nausea or vomiting. 06/16/23   [provider]  QUEtiapine  (SEROQUEL ) 25 MG tablet Take 25 mg by mouth at bedtime as needed. 03/14/24   [provider]  ranolazine  (RANEXA ) 500 MG 12 hr tablet Take 500 mg by mouth 2 (two) times daily. 07/06/23   [provider]  risperiDONE  (RISPERDAL ) 0.25 MG tablet Take 0.25 mg by mouth at bedtime. 03/18/23   [provider]  rosuvastatin  (CRESTOR ) 40 MG tablet Take 1 tablet (40 mg total) by mouth daily  at 6 PM. 02/24/24 03/25/24  Unk Garb, DO                                                                                                                                    Past Surgical History Past Surgical History:  Procedure Laterality Date   CATARACT EXTRACTION W/PHACO Left 03/22/2022   Procedure: CATARACT EXTRACTION PHACO AND INTRAOCULAR LENS PLACEMENT (IOC);  Surgeon: Tarri Farm, MD;  Location: [facility];  Service: Ophthalmology;  Laterality: Left;  CDE: 25.22   COLONOSCOPY  08/03/2004   Jenkins-numerous large diverticula in the descending, transverse, descending, and sigmoid colon. Otherwise normal exam.   COLONOSCOPY  07/12/2012   RMR: External hemorrhoidal tag; multiple rectal and colonic polyps removed and/or treated as described above. Pancolonic diverticulosis. Bx-tubular adenomas, rectal hyperplastic polyp. next colonoscopy in 06/2015.   COLONOSCOPY N/A 06/22/2016   Procedure: COLONOSCOPY;  Surgeon: Alanda Allegra, MD;  Location: [facility];  Service: Gastroenterology;  Laterality: N/A;  730   CORONARY ANGIOPLASTY WITH STENT PLACEMENT     CORONARY ARTERY BYPASS GRAFT  1996    LIMA to LAD, SVG to D2, SVG to PDA, SVG to OM1 and OM2   CORONARY STENT INTERVENTION N/A 12/10/2019   Procedure: CORONARY STENT INTERVENTION;  Surgeon: Odie Benne, MD;  Location: [facility];  Service: Cardiovascular;  Laterality: N/A;   CORONARY ULTRASOUND/IVUS N/A 12/10/2019   Procedure: Intravascular Ultrasound/IVUS;  Surgeon: Odie Benne, MD;  Location: [facility];  Service: Cardiovascular;  Laterality: N/A;   ESOPHAGOGASTRODUODENOSCOPY N/A 03/16/2013   Procedure: ESOPHAGOGASTRODUODENOSCOPY (EGD);  Surgeon: Suzette Espy, MD;  Location: [facility];  Service: Endoscopy;  Laterality: N/A;  12:00-moved to 1030 Leigh Ann notified pt   ESOPHAGOGASTRODUODENOSCOPY N/A 03/06/2013   Procedure: ESOPHAGOGASTRODUODENOSCOPY (EGD);  Surgeon: Suzette Espy, MD;  Location: [facility];  Service: Endoscopy;  Laterality: N/A;   HERNIA REPAIR     LEFT HEART CATH AND CORS/GRAFTS ANGIOGRAPHY N/A 12/10/2019   Procedure: LEFT HEART CATH AND CORS/GRAFTS ANGIOGRAPHY;  Surgeon: Odie Benne, MD;  Location: [facility];  Service: Cardiovascular;  Laterality: N/A;   LEFT HEART CATH AND CORS/GRAFTS ANGIOGRAPHY N/A 03/02/2021   Procedure: LEFT HEART CATH AND CORS/GRAFTS ANGIOGRAPHY;  Surgeon: Sammy Crisp, MD;  Location: [facility];  Service: Cardiovascular;  Laterality: N/A;   Family History Family History  Problem Relation Age of Onset   Heart disease Other    Heart attack Mother    Colon cancer Neg Hx     Social History Social History   Tobacco Use   Smoking status: Former    Current packs/day: 0.00    Average packs/day: 2.0 packs/day for 40.0 years (80.0 ttl pk-yrs)    Types: Cigarettes    Start date: 12/27/1954    Quit date: 12/27/1994    Years since quitting: 29.4   Smokeless tobacco: Never  Vaping Use  Vaping status: Never Used  Substance Use Topics   Alcohol use: No    Alcohol/week: 0.0 standard drinks of alcohol   Drug use:  No   Allergies Patient has no known allergies.  Review of Systems A thorough review of systems was obtained and all systems are negative except as noted in the HPI and PMH.   Physical Exam Vital Signs  I have reviewed the triage vital signs BP (!) 164/66 (BP Location: Right Arm)   Pulse 64   Temp 97.7 F (36.5 C) (Oral)   Resp 20   Ht 5\' 10"  (1.778 m)   Wt 97.1 kg   SpO2 99%   BMI 30.72 kg/m  Physical Exam Vitals and nursing note reviewed.  Constitutional:      General: He is not in acute distress.    Appearance: He is well-developed.     Comments: Appears uncomfortable  HENT:     Head: Normocephalic and atraumatic.     Right Ear: External ear normal.     Left Ear: External ear normal.     Mouth/Throat:     Mouth: Mucous membranes are moist.  Eyes:     General: No scleral icterus. Cardiovascular:     Rate and Rhythm: Normal rate.     Pulses: Normal pulses.     Heart sounds: Normal heart sounds.  Pulmonary:     Effort: Pulmonary effort is normal. No respiratory distress.     Breath sounds: Normal breath sounds.  Abdominal:     General: Abdomen is flat. There is distension.     Palpations: Abdomen is soft.     Tenderness: There is no abdominal tenderness.  Musculoskeletal:     Cervical back: No rigidity.     Right lower leg: No edema.     Left lower leg: No edema.  Skin:    General: Skin is warm and dry.     Capillary Refill: Capillary refill takes less than 2 seconds.  Neurological:     Mental Status: He is alert.  Psychiatric:        Mood and Affect: Mood normal.        Behavior: Behavior normal.     ED Results and Treatments Labs (all labs ordered are listed, but only abnormal results are displayed) Labs Reviewed  URINALYSIS, ROUTINE W REFLEX MICROSCOPIC - Abnormal; Notable for the following components:      Result Value   Glucose, UA 50 (*)    Ketones, ur 5 (*)    Protein, ur 30 (*)    All other components within normal limits  CBC WITH  DIFFERENTIAL/PLATELET - Abnormal; Notable for the following components:   Neutro Abs 8.3 (*)    All other components within normal limits  BASIC METABOLIC PANEL WITH GFR - Abnormal; Notable for the following components:   Potassium 3.4 (*)    Glucose, Bld 147 (*)    All other components within normal limits  Radiology CT ABDOMEN PELVIS W CONTRAST Result Date: 05/17/2024 CLINICAL DATA:  Abdominal pain, acute, nonlocalized EXAM: CT ABDOMEN AND PELVIS WITH CONTRAST TECHNIQUE: Multidetector CT imaging of the abdomen and pelvis was performed using the standard protocol following bolus administration of intravenous contrast. RADIATION DOSE REDUCTION: This exam was performed according to the departmental dose-optimization program which includes automated exposure control, adjustment of the mA and/or kV according to patient size and/or use of iterative reconstruction technique. CONTRAST:  OMNIPAQUE  IOHEXOL  300 MG/ML  SOLN COMPARISON:  March 31, 2024, July 29, 2021 FINDINGS: Lower chest: No focal airspace consolidation or pleural effusion.Mild cardiomegaly. Dense multi-vessel coronary atherosclerosis. Hepatobiliary: No mass. 6 mm nodule dependently on the gallbladder wall (coronal 67, axial 33). No intrahepatic or extrahepatic biliary ductal dilation. The portal veins are patent. Pancreas: No mass or main ductal dilation. No peripancreatic inflammation or fluid collection. Spleen: Normal size. No mass. Adrenals/Urinary Tract: No adrenal masses. Mild renal cortical atrophy. Bilateral renal cysts, unchanged. No nephrolithiasis or hydronephrosis. The urinary bladder is completely decompressed. Urinary catheter in place. Stomach/Bowel: The stomach is decompressed without focal abnormality. No small bowel wall thickening or inflammation. No small bowel obstruction.Normal appendix. Moderate  volume fecal loading throughout the colon with a prominent stool ball in the rectum, measuring 8.7 x 7.5 cm. Moderate presacral stranding with inflammation extending into the sigmoid mesocolon. Extensive descending and sigmoid colonic diverticulosis. Vascular/Lymphatic: No aortic aneurysm. Diffuse aortoiliac atherosclerosis. No intraabdominal or pelvic lymphadenopathy. Reproductive: The prostate was not well visualized or evaluated.No free pelvic fluid. Other: No pneumoperitoneum, ascites, or mesenteric inflammation. Musculoskeletal: No acute fracture or destructive lesion. Osteopenia. Multilevel degenerative disc disease of the spine. IMPRESSION: 1. Moderate volume fecal loading throughout the colon with a prominent stool ball in the rectum, measuring 8.7 x 7.5 cm, consistent with constipation and possible impaction. Moderate presacral stranding with inflammation extending into the sigmoid mesocolon, worrisome for superimposed stercoral colitis. 2. Extensive descending and sigmoid colonic diverticulosis. While there is inflammation in the sigmoid mesocolon, no discrete or focally inflamed diverticulum is present to suggest acute diverticulitis. 3. Small, 6 mm dependent nodule in the gallbladder wall (coronal 67), possibly a small gallbladder polyp. 4. Well-positioned urinary catheter. Electronically Signed   By: Rance Burrows M.D.   On: 05/17/2024 10:26    Pertinent labs & imaging results that were available during my care of the patient were reviewed by me and considered in my medical decision making (see MDM for details).  Medications Ordered in ED Medications  fentaNYL  (SUBLIMAZE ) injection 50 mcg (50 mcg Intravenous Given 05/17/24 0810)  ondansetron  (ZOFRAN ) injection 4 mg (4 mg Intravenous Given 05/17/24 0812)  iohexol  (OMNIPAQUE ) 300 MG/ML solution 100 mL (100 mLs Intravenous Contrast Given 05/17/24 0923)  sodium phosphate  (FLEET) enema 1 enema (1 enema Rectal Given 05/17/24 1127)  fentaNYL   (SUBLIMAZE ) injection 50 mcg (50 mcg Intravenous Given 05/17/24 1125)  Procedures Procedures  (including critical care time)  Medical Decision Making / ED Course    Medical Decision Making:    KERON NEENAN is a 89 y.o. male with past medical history as below, significant for Alzheimer's disease, BPH, CAD, CABG 1996, dementia, HLD, OSA who presents to the ED with complaint of urinary retention, constipation. The complaint involves an extensive differential diagnosis and also carries with it a high risk of complications and morbidity.  Serious etiology was considered. Ddx includes but is not limited to: Constipation, obstruction, bladder outlet obstruction, BPH, neurogenic bladder, nephrolithiasis, UTI, medication induced, etc.  Complete initial physical exam performed, notably the patient was in no acute distress, does appear uncomfortable, pacing around the room.    Reviewed and confirmed nursing documentation for past medical history, family history, social history.  Vital signs reviewed.     Brief summary:  89 year old male history as above including BPH here with urinary retention, constipation Seen in the ER last month with fecal impaction, was advised to reduce opiate medication use at that time.  Patient had large bowel movement following enema, discharged in stable condition   Clinical Course as of 05/17/24 1411  Thu May 17, 2024  0727 Urinary retention noted on bladder scan >1L, place foley [SG]  0824 Creatinine: 0.98 Stable cr [SG]  1007 Bacteria, UA: NONE SEEN UTI unlikely  [SG]  1100 Pt does not want disimpaction, will trial enema [SG]    Clinical Course User Index [SG] Teddi Favors, DO     Place Foley, was prev on alpha blocker around 1 yr ago but no longer, will restart. Will have him follow-up with urology  Had large BM after  enema, discussed bowel protocol for home. F/u w/ pcp re: opiate use  Patient in no distress and overall condition improved here in the ED. Detailed discussions were had with the patient/guardian regarding current findings, and need for close f/u with PCP or on call doctor. The patient/guardian has been instructed to return immediately if the symptoms worsen in any way for re-evaluation. Patient/guardian verbalized understanding and is in agreement with current care plan. All questions answered prior to discharge.             Additional history obtained: -Additional history obtained from na -External records from outside source obtained and reviewed including: Chart review including previous notes, labs, imaging, consultation notes including  Prior ER visits, prior admission   Lab Tests: -I ordered, reviewed, and interpreted labs.   The pertinent results include:   Labs Reviewed  URINALYSIS, ROUTINE W REFLEX MICROSCOPIC - Abnormal; Notable for the following components:      Result Value   Glucose, UA 50 (*)    Ketones, ur 5 (*)    Protein, ur 30 (*)    All other components within normal limits  CBC WITH DIFFERENTIAL/PLATELET - Abnormal; Notable for the following components:   Neutro Abs 8.3 (*)    All other components within normal limits  BASIC METABOLIC PANEL WITH GFR - Abnormal; Notable for the following components:   Potassium 3.4 (*)    Glucose, Bld 147 (*)    All other components within normal limits    Notable for labs stable   EKG   EKG Interpretation Date/Time:    Ventricular Rate:    PR Interval:    QRS Duration:    QT Interval:    QTC Calculation:   R Axis:      Text Interpretation:  Imaging Studies ordered: I ordered imaging studies including CTAP  I agree with the radiologist interpretation If any imaging was obtained with contrast I closely monitored patient for any possible adverse reaction a/w contrast administration in the  emergency department   Medicines ordered and prescription drug management: Meds ordered this encounter  Medications   fentaNYL  (SUBLIMAZE ) injection 50 mcg   ondansetron  (ZOFRAN ) injection 4 mg   tamsulosin  (FLOMAX ) 0.4 MG CAPS capsule    Sig: Take 1 capsule (0.4 mg total) by mouth daily for 14 days.    Dispense:  14 capsule    Refill:  0   iohexol  (OMNIPAQUE ) 300 MG/ML solution 100 mL   sodium phosphate  (FLEET) enema 1 enema   fentaNYL  (SUBLIMAZE ) injection 50 mcg    -I have reviewed the patients home medicines and have made adjustments as needed   Consultations Obtained: na   Cardiac Monitoring: Continuous pulse oximetry interpreted by myself, 99% on RA.    Social Determinants of Health:  Diagnosis or treatment significantly limited by social determinants of health: former smoker   Reevaluation: After the interventions noted above, I reevaluated the patient and found that they have improved  Co morbidities that complicate the patient evaluation  Past Medical History:  Diagnosis Date   Alzheimer disease (HCC)    Anxiety    Arthritis    Atrophic gastritis    a. By EGD 02/2013.   Carotid artery disease (HCC)    a. mild-mod plaque, <50% stenosis bilat by duplex 2018.   Coronary atherosclerosis of native coronary artery    a. Multivessel s/p CABG 1996. b. DESx2 to SVG-PDA in 2012 c. DES to distal LM in 11/2019 d. cath in 02/2021 showing patent LM stent, patent LIMA-LAD, patent SVG-OM1 with chronically occluded jump limb to OM2 and chronic occlusion of SVG-D2 and SVG-PDA and no targets for intervention   DDD (degenerative disc disease)    Chronic back pain   Dementia (HCC)    Enlarged prostate    Essential hypertension    Hematuria    History of radiation therapy    Left Lung- 04/26/23-Dr. Retta Caster   Hypothyroidism    LBBB (left bundle branch block)    MI (myocardial infarction) (HCC)    Mixed hyperlipidemia    OA (osteoarthritis)    OSA (obstructive sleep  apnea)    Pneumonia due to COVID-19 virus    February 2021   Sinus bradycardia    a. Aricept  and BB discontinued due to this.      Dispostion: Disposition decision including need for hospitalization was considered, and patient discharged from emergency department.    Final Clinical Impression(s) / ED Diagnoses Final diagnoses:  Urinary retention  Constipation, unspecified constipation type  Chronic prescription opiate use        Teddi Favors, DO 05/17/24 1411

## 2024-05-17 NOTE — ED Notes (Signed)
 Pt assisted to the bathroom. Pt has urinary urgency but "nothing" or a "trickle" comes out

## 2024-05-22 ENCOUNTER — Encounter: Payer: Self-pay | Admitting: Medical

## 2024-05-22 ENCOUNTER — Ambulatory Visit: Attending: Medical | Admitting: Medical

## 2024-05-22 ENCOUNTER — Telehealth: Payer: Self-pay | Admitting: Urology

## 2024-05-22 VITALS — BP 122/76 | HR 74 | Ht 70.0 in | Wt 210.4 lb

## 2024-05-22 DIAGNOSIS — I5032 Chronic diastolic (congestive) heart failure: Secondary | ICD-10-CM

## 2024-05-22 DIAGNOSIS — E7849 Other hyperlipidemia: Secondary | ICD-10-CM | POA: Diagnosis not present

## 2024-05-22 DIAGNOSIS — I2510 Atherosclerotic heart disease of native coronary artery without angina pectoris: Secondary | ICD-10-CM

## 2024-05-22 DIAGNOSIS — I10 Essential (primary) hypertension: Secondary | ICD-10-CM

## 2024-05-22 DIAGNOSIS — I5020 Unspecified systolic (congestive) heart failure: Secondary | ICD-10-CM

## 2024-05-22 DIAGNOSIS — I779 Disorder of arteries and arterioles, unspecified: Secondary | ICD-10-CM

## 2024-05-22 DIAGNOSIS — R011 Cardiac murmur, unspecified: Secondary | ICD-10-CM | POA: Diagnosis not present

## 2024-05-22 NOTE — Progress Notes (Signed)
 Cardiology Office Note:  .   Date:  05/22/2024  ID:  Dustin [name], DOB [birth year], MRN [id number] PCP: Kathyleen Parkins, MD  [facility] Providers Cardiologist:  Teddie Favre, MD {   History of Present Illness: .   Dustin [name] is a 89 y.o. male with a h/o CAD s/p remote CABG, HFmrEF, LBBB, HLD, HTN, baseline bradycardia, carotid artery disease, thyroid  disease, h/o atrophic gastritis, dementia who presents for follow-up.   She has a h/o CAD with CABG in 1996 and in 2012 he underwent DES placements x 2 SVG-PDA and DES to distal LM in 2020. Cardiac cath in 2022 showed patent LM stent, patent LIMA-LAD, patent SVG-OM1. Jump limb to OM2 was chronically occluded. Chronic occlusion noted to SVG-D2 and SVG -RPDA, LVEF was mildly reduced EF 45-50%. GDMT limited due to baseline bradycardia and unable to start SGLT2i due frequent UTIs.   Today, the patient is overall stable. He denies chest pain. He has chronic and unchanged sob. He denies lower leg edema, orthopnea or pnd. Denies syncope or presyncope. He reports he does not take ASA, he is unsure if he takes plavix . Will need to call the pharmacy to confirm medications given dementia history.   Studies Reviewed: .        Limited Echo on 11/23/2022:  1. Left ventricular ejection fraction, by estimation, is 60 to 65%. The  left ventricle has normal function. The left ventricle demonstrates  regional wall motion abnormalities (see scoring diagram/findings for  description). The left ventricular internal  cavity size was mildly dilated. There is mild concentric left ventricular  hypertrophy. Left ventricular diastolic function could not be evaluated.   2. Right ventricular systolic function is normal. The right ventricular  size is normal. Tricuspid regurgitation signal is inadequate for assessing  PA pressure.   3. The mitral valve is degenerative. Mild mitral valve regurgitation. The  mean mitral valve gradient is 2.0 mmHg.   4.  The aortic valve is tricuspid. There is mild calcification of the  aortic valve. Aortic valve regurgitation is not visualized. Aortic valve  sclerosis/calcification is present, without any evidence of aortic  stenosis.   5. The inferior vena cava is dilated in size with >50% respiratory  variability, suggesting right atrial pressure of 8 mmHg.   Comparison(s): Prior images reviewed side by side. LVEF normal range at  60-65% with similar wall motion abnormality. Diastolic function not  assessed.   Cardiac monitor on 09/15/2022: Predominant rhythm is sinus with heart rate ranging from 46 bpm up to 77 bpm and average heart rate 58 bpm. There were occasional PACs representing 1.1% total beats with otherwise rare atrial couplets and triplets. There were rare PVCs including ventricular couplets representing less than 1% total beats. Twenty very brief episodes of PSVT were noted, the longest of which lasted only 7 beats.  There were no sustained atrial arrhythmias. No pauses.   Carotid doppler on 09/13/2022: IMPRESSION: Color duplex indicates moderate heterogeneous and calcified plaque, with no hemodynamically significant stenosis by duplex criteria in the extracranial cerebrovascular circulation.     Left heart cath on 03/02/2021:  Conclusions: Severe native coronary artery disease. Including 99% mid LAD stenosis with competitive flow from LIMA-LAD, as well as chronic total occlusions of proximal LCx and mid RCA. Widely patent LMCA stent. Widely patent LIMA-LAD. Patent SVG-OM1 with minimal liminal irregularities.  Jump limb to OM2 is chronically occluded. Chronically occlused SVG-D2 and SVG-rPDA. Mildly reduced left ventricular contraction with global hypokinesis (LVEF 45-50%). Moderately  elevated left ventricular filling pressure (LVEDP 25-30 mmHg).   Recommendations: Escalate furosemide  to 40 mg daily. Consider further escalation of antianginal therapy.  No interventional targets  identified on today's catheterization. Consider echo to reassess LVEF and exclude other structural abnormalities that could be contributing to symptoms. Continue aggressive secondary prevention of coronary artery disease.     Left heart cath on 12/10/2019:  Origin lesion is 100% stenosed. Mid Graft to Dist Graft lesion is 100% stenosed. Mid RCA lesion is 100% stenosed. Prox Graft lesion is 100% stenosed. Mid LAD lesion is 99% stenosed. Prox Cx to Mid Cx lesion is 100% stenosed. SVG graft was visualized by angiography. SVG graft was visualized by angiography. Origin to Prox Graft lesion is 100% stenosed. SVG graft was visualized by angiography. LIMA graft was visualized by angiography. Ost LM to Mid LM lesion is 90% stenosed. A drug-eluting stent was successfully placed using a STENT RESOLUTE ONYX 4.5X18. Post intervention, there is a 0% residual stenosis.   1. Severe triple vessel CAD s/p 5V CABG with 2/5 patent bypass grafts.  2. The left main artery has a moderate ostial stenosis and a severe mid stenosis.  3. The LAD has severe proximal to mid stenosis beyond the first large caliber diagonal branch. The LIMA to the LAD is patent. The vein graft to the second diagonal branch is occluded. The second diagonal branch fills from flow through the LIMA to the LAD. The first diagonal branch is not protected by the LIMA graft.  4. The ramus intermediate branch is a moderate caliber vessel and is not protected by grafts.  5. The Circumflex is a moderate caliber vessel with chronic mid occlusion. The sequential vein graft to OM1 and OM2 is patent to OM1 but the sequential segment of the graft to OM2 is occluded. OM2 fills from left to left collaterals.  6. The RCA is a large caliber vessel with chronic mid occlusion. The vein graft to the PDA is now occluded. The distal RCA and it's branches fill from left to right collaterals supplied through the LIMA to the LAD.  7. Successful PTCA/DES x 1  ostial to to distal left main artery.    Recommendations: Continue ASA and Plavix  for lifetime given the left main stenting. Continue statin and Imdur . Anticipate discharge home tomorrow.      Physical Exam:   VS:  BP 122/76 (BP Location: Left Arm, Patient Position: Sitting, Cuff Size: Normal)   Pulse 74   Ht 5\' 10"  (1.778 m)   Wt 210 lb 6.4 oz (95.4 kg)   SpO2 93%   BMI 30.19 kg/m    Wt Readings from Last 3 Encounters:  05/22/24 210 lb 6.4 oz (95.4 kg)  05/17/24 214 lb 1.1 oz (97.1 kg)  04/16/24 214 lb (97.1 kg)    GEN: Well nourished, well developed in no acute distress NECK: No JVD; + R carotid bruit CARDIAC: RRR, + murmur, rubs, gallops RESPIRATORY:  Clear to auscultation without rales, wheezing or rhonchi  ABDOMEN: Soft, non-tender, non-distended EXTREMITIES:  No edema; No deformity   ASSESSMENT AND PLAN: .    CAD s/p remote CABG and subsequent PCI/DES The patient denies chest pain. He has chronic and unchanged DOE. He is unsure what he is taking. He should be taking ASA and Plavix  per last note. Continue ASA 81mg  daily, Plavix  75mg  daily, Imdur  120mg  daily, Ranexa  500mg  BID, and Crestor . Lifestyle is very sedentary.   HFimpEF Limited echo in 2023 showed LVEF 60-65%. The patient is  euvolemic on exam. He is not a candidate for SGLT2i d/t UTIS. Continue lasix  20mg  daily with potassium.   HLD Repeat lipids today. Continue Crestor  40mg  daily.   HTN BP is stable. Continue Imdur .  Murmur Prior echo showed mild MR and mild aortic valve calcification. Repeat echo.   Carotid artery disease Carotid artery US  showed moderate heterogenous and calcified plaque, with no hemodynamically significant stenosis. Reports occasional lightheadedness. Repeat carotid US  today.      Dispo: Follow-up in 6 months  Signed, Yerachmiel Spinney Rebekah Canada, PA-C

## 2024-05-22 NOTE — Patient Instructions (Signed)
 Medication Instructions:  Your physician recommends that you continue on your current medications as directed. Please refer to the Current Medication list given to you today.  *If you need a refill on your cardiac medications before your next appointment, please call your pharmacy*  Lab Work: Your physician recommends that you return for a FASTING lipid profile: Tomorrow   If you have labs (blood work) drawn today and your tests are completely normal, you will receive your results only by: MyChart Message (if you have MyChart) OR A paper copy in the mail If you have any lab test that is abnormal or we need to change your treatment, we will call you to review the results.  Testing/Procedures: Your physician has requested that you have a carotid duplex. This test is an ultrasound of the carotid arteries in your neck. It looks at blood flow through these arteries that supply the brain with blood. Allow one hour for this exam. There are no restrictions or special instructions.  Your physician has requested that you have an echocardiogram. Echocardiography is a painless test that uses sound waves to create images of your heart. It provides your doctor with information about the size and shape of your heart and how well your heart's chambers and valves are working. This procedure takes approximately one hour. There are no restrictions for this procedure. Please do NOT wear cologne, perfume, aftershave, or lotions (deodorant is allowed). Please arrive 15 minutes prior to your appointment time.  Please note: We ask at that you not bring children with you during ultrasound (echo/ vascular) testing. Due to room size and safety concerns, children are not allowed in the ultrasound rooms during exams. Our front office staff cannot provide observation of children in our lobby area while testing is being conducted. An adult accompanying a patient to their appointment will only be allowed in the ultrasound room  at the discretion of the ultrasound technician under special circumstances. We apologize for any inconvenience.   Follow-Up: At [facility], you and your health needs are our priority.  As part of our continuing mission to provide you with exceptional heart care, our providers are all part of one team.  This team includes your primary Cardiologist (physician) and Advanced Practice Providers or APPs (Physician Assistants and Nurse Practitioners) who all work together to provide you with the care you need, when you need it.  Your next appointment:   6 month(s)  Provider:   You may see Teddie Favre, MD or one of the following Advanced Practice Providers on your designated Care Team:   Woodfin Hays, PA-C  [place], [place] Theotis Flake, [place]    We recommend signing up for the patient portal called "MyChart".  Sign up information is provided on this After Visit Summary.  MyChart is used to connect with patients for Virtual Visits (Telemedicine).  Patients are able to view lab/test results, encounter notes, upcoming appointments, etc.  Non-urgent messages can be sent to your provider as well.   To learn more about what you can do with MyChart, go to [email or web address].   Other Instructions Thank you for choosing [facility]!

## 2024-05-22 NOTE — Telephone Encounter (Signed)
 Had to go to ER and needs an appt for cath removal

## 2024-05-22 NOTE — Telephone Encounter (Signed)
 Patient has not been seen by provider in over 2 yrs. Please schedule with a provider.

## 2024-05-23 ENCOUNTER — Encounter (HOSPITAL_COMMUNITY): Payer: Self-pay

## 2024-05-23 ENCOUNTER — Emergency Department (HOSPITAL_COMMUNITY)
Admission: EM | Admit: 2024-05-23 | Discharge: 2024-05-23 | Disposition: A | Discharge status: Home / Self Care | Attending: Emergency Medicine | Admitting: Emergency Medicine

## 2024-05-23 ENCOUNTER — Other Ambulatory Visit: Payer: Self-pay

## 2024-05-23 ENCOUNTER — Other Ambulatory Visit (HOSPITAL_COMMUNITY)
Admission: RE | Admit: 2024-05-23 | Discharge: 2024-05-23 | Disposition: A | Discharge status: Home / Self Care | Source: Ambulatory Visit | Attending: Medical | Admitting: Medical

## 2024-05-23 DIAGNOSIS — I110 Hypertensive heart disease with heart failure: Secondary | ICD-10-CM | POA: Diagnosis not present

## 2024-05-23 DIAGNOSIS — I5032 Chronic diastolic (congestive) heart failure: Secondary | ICD-10-CM | POA: Diagnosis not present

## 2024-05-23 DIAGNOSIS — Y846 Urinary catheterization as the cause of abnormal reaction of the patient, or of later complication, without mention of misadventure at the time of the procedure: Secondary | ICD-10-CM | POA: Insufficient documentation

## 2024-05-23 DIAGNOSIS — G309 Alzheimer's disease, unspecified: Secondary | ICD-10-CM | POA: Insufficient documentation

## 2024-05-23 DIAGNOSIS — Z79899 Other long term (current) drug therapy: Secondary | ICD-10-CM | POA: Diagnosis not present

## 2024-05-23 DIAGNOSIS — Z951 Presence of aortocoronary bypass graft: Secondary | ICD-10-CM | POA: Diagnosis not present

## 2024-05-23 DIAGNOSIS — Z955 Presence of coronary angioplasty implant and graft: Secondary | ICD-10-CM | POA: Diagnosis not present

## 2024-05-23 DIAGNOSIS — Z87891 Personal history of nicotine dependence: Secondary | ICD-10-CM | POA: Insufficient documentation

## 2024-05-23 DIAGNOSIS — Z7951 Long term (current) use of inhaled steroids: Secondary | ICD-10-CM | POA: Insufficient documentation

## 2024-05-23 DIAGNOSIS — F0390 Unspecified dementia without behavioral disturbance: Secondary | ICD-10-CM | POA: Insufficient documentation

## 2024-05-23 DIAGNOSIS — N390 Urinary tract infection, site not specified: Secondary | ICD-10-CM | POA: Diagnosis not present

## 2024-05-23 DIAGNOSIS — E7849 Other hyperlipidemia: Secondary | ICD-10-CM | POA: Diagnosis present

## 2024-05-23 DIAGNOSIS — Z7982 Long term (current) use of aspirin: Secondary | ICD-10-CM | POA: Insufficient documentation

## 2024-05-23 DIAGNOSIS — Z7902 Long term (current) use of antithrombotics/antiplatelets: Secondary | ICD-10-CM | POA: Insufficient documentation

## 2024-05-23 DIAGNOSIS — R339 Retention of urine, unspecified: Secondary | ICD-10-CM | POA: Diagnosis present

## 2024-05-23 DIAGNOSIS — I2510 Atherosclerotic heart disease of native coronary artery without angina pectoris: Secondary | ICD-10-CM | POA: Diagnosis not present

## 2024-05-23 DIAGNOSIS — J449 Chronic obstructive pulmonary disease, unspecified: Secondary | ICD-10-CM | POA: Diagnosis not present

## 2024-05-23 DIAGNOSIS — T83511A Infection and inflammatory reaction due to indwelling urethral catheter, initial encounter: Secondary | ICD-10-CM | POA: Diagnosis not present

## 2024-05-23 LAB — LIPID PANEL
Cholesterol: 100 mg/dL (ref 0–200)
HDL: 30 mg/dL — ABNORMAL LOW (ref >40)
LDL Cholesterol: 39 mg/dL (ref 0–99)
Total CHOL/HDL Ratio: 3.3 ratio
Triglycerides: 155 mg/dL — ABNORMAL HIGH (ref <150)
VLDL: 31 mg/dL (ref 0–40)

## 2024-05-23 LAB — URINALYSIS, W/ REFLEX TO CULTURE (INFECTION SUSPECTED)
Bilirubin Urine: NEGATIVE
Glucose, UA: NEGATIVE mg/dL
Ketones, ur: NEGATIVE mg/dL
Nitrite: NEGATIVE
Protein, ur: 100 mg/dL — AB
RBC / HPF: >50 RBC/hpf (ref 0–5)
Specific Gravity, Urine: 1.012 (ref 1.005–1.030)
WBC, UA: >50 WBC/hpf (ref 0–5)
pH: 6 (ref 5.0–8.0)

## 2024-05-23 MED ORDER — LIDOCAINE HCL (PF) 1 % IJ SOLN
2.0000 mL | Freq: Once | INTRAMUSCULAR | Status: AC
  Administered 2024-05-23: 2 mL
  Filled 2024-05-23: qty 5

## 2024-05-23 MED ORDER — CEFTRIAXONE SODIUM 1 G IJ SOLR
1.0000 g | Freq: Once | INTRAMUSCULAR | Status: AC
  Administered 2024-05-23: 1 g via INTRAMUSCULAR
  Filled 2024-05-23: qty 10

## 2024-05-23 MED ORDER — CEFDINIR 300 MG PO CAPS: 300.0000 mg | ORAL_CAPSULE | Freq: Two times a day (BID) | ORAL | 0 refills | Status: DC

## 2024-05-23 NOTE — ED Triage Notes (Signed)
 Pt arrived via POV c/o urinary retention and reports his foley catheter is causing the problem. Pt reports he thinks the foley catheter is broken and is requesting it to be removed.

## 2024-05-23 NOTE — Discharge Instructions (Signed)
 We removed your Foley catheter.  Your urine test did show signs concerning for a urinary infection.  We have prescribed you antibiotics.  Please take these as prescribed.  If you have any recurrence of your issues with urination, or you develop any new symptoms like fevers or chills, abdominal pain, lightheadedness or dizziness, please return to the emergency department.

## 2024-05-23 NOTE — ED Provider Notes (Signed)
 [place] EMERGENCY DEPARTMENT AT [facility] Provider Note  CSN: [id number] Arrival date & time: 05/23/24 1055  Chief Complaint(s) foley catheter problem  HPI Dustin [name] is a 89 y.o. male history of dementia, obesity, COPD, constipation presenting to the emergency department with Foley catheter issue.  Patient reports that he recently had a Foley catheter placed for urinary retention.  This placed about a week ago.  He reports he is having discomfort and pain and sensation of urinary urgency and would like it to be removed.  He reports his constipation has improved and he thinks he will be able to urinate.  Denies any fevers or chills.   Past Medical History Past Medical History:  Diagnosis Date   Alzheimer disease (HCC)    Anxiety    Arthritis    Atrophic gastritis    a. By EGD 02/2013.   Carotid artery disease (HCC)    a. mild-mod plaque, <50% stenosis bilat by duplex 2018.   Coronary atherosclerosis of native coronary artery    a. Multivessel s/p CABG 1996. b. DESx2 to SVG-PDA in 2012 c. DES to distal LM in 11/2019 d. cath in 02/2021 showing patent LM stent, patent LIMA-LAD, patent SVG-OM1 with chronically occluded jump limb to OM2 and chronic occlusion of SVG-D2 and SVG-PDA and no targets for intervention   DDD (degenerative disc disease)    Chronic back pain   Dementia (HCC)    Enlarged prostate    Essential hypertension    Hematuria    History of radiation therapy    Left Lung- 04/26/23-Dr. Retta Caster   Hypothyroidism    LBBB (left bundle branch block)    MI (myocardial infarction) (HCC)    Mixed hyperlipidemia    OA (osteoarthritis)    OSA (obstructive sleep apnea)    Pneumonia due to COVID-19 virus    February 2021   Sinus bradycardia    a. Aricept  and BB discontinued due to this.   Patient Active Problem List   Diagnosis Date Noted   Chronic respiratory failure with hypoxia (HCC) 04/16/2024   Obesity, Class I, BMI 30-34.9 02/22/2024   COPD  with acute exacerbation (HCC) 02/21/2024   Influenza A 02/21/2024   Primary malignant neoplasm of left lower lobe of lung (HCC) 04/05/2023   Dysuria 10/24/2020   COPD (chronic obstructive pulmonary disease) (HCC) 12/10/2019   Fall 11/07/2018   Depression 11/07/2018   Benign prostatic hyperplasia with urinary obstruction 11/07/2018   Chronic pain 11/07/2018   Pressure injury of skin 02/15/2017   Chronic diastolic CHF (congestive heart failure) (HCC) 12/24/2016   Sinus bradycardia 12/23/2016   Foot pain, bilateral 12/23/2016   Hypokalemia 04/05/2016   Hypothyroidism 04/03/2016   Dementia (HCC) 04/03/2016   Acute on chronic respiratory failure with hypoxia (HCC) - on nocturnal O2 @ 2 L/min. 04/03/2016   Memory loss 04/20/2015   Neck pain on right side 08/29/2014   Neck pain 08/29/2014   Left-sided weakness 06/28/2014   Chronic chest wall pain 06/27/2014   Tubular adenoma of colon 03/05/2013   Anorexia 03/05/2013   Loss of weight 03/05/2013   Chronic constipation 07/04/2012   Hyperlipidemia 10/01/2009   OSA (obstructive sleep apnea) 10/01/2009   Alzheimer disease (HCC) 10/01/2009   HYPERTENSION, BENIGN 10/01/2009   Coronary artery disease - not on betablocker due to baseline bradycardia. Documented by cardiology 10/01/2009   Home Medication(s) Prior to Admission medications   Medication Sig Start Date End Date Taking? Authorizing Provider  cefdinir (OMNICEF) 300 MG  capsule Take 1 capsule (300 mg total) by mouth 2 (two) times daily for 7 days. 05/23/24 05/30/24 Yes Mordecai Applebaum, MD  albuterol  (PROVENTIL ) (2.5 MG/3ML) 0.083% nebulizer solution Take 3 mLs (2.5 mg total) by nebulization every 2 (two) hours as needed for wheezing or shortness of breath. 11/24/22   Colin Dawley, MD  albuterol  (VENTOLIN  HFA) 108 (90 Base) MCG/ACT inhaler Inhale 2 puffs into the lungs every 6 (six) hours as needed for wheezing or shortness of breath. 11/24/22   Colin Dawley, MD  aspirin  EC 81 MG  tablet Take 1 tablet (81 mg total) by mouth daily. Swallow whole. 02/24/24   Unk Garb, DO  Budeson-Glycopyrrol-Formoterol  (BREZTRI  AEROSPHERE) 160-9-4.8 MCG/ACT AERO Inhale 2 puffs into the lungs in the morning and at bedtime. 03/16/23   Sood, Vineet, MD  clopidogrel  (PLAVIX ) 75 MG tablet Take 75 mg by mouth daily. 03/14/24   [provider]  DULoxetine  (CYMBALTA ) 30 MG capsule Take 30 mg by mouth daily. 05/10/23   [provider]  escitalopram  (LEXAPRO ) 20 MG tablet Take 20 mg by mouth every morning.     [provider]  furosemide  (LASIX ) 20 MG tablet Take 1 tablet (20 mg total) by mouth 2 (two) times daily. For fluid 11/24/22   Colin Dawley, MD  gabapentin  (NEURONTIN ) 100 MG capsule Take 100 mg by mouth 3 (three) times daily. 03/04/23   [provider]  isosorbide  mononitrate (IMDUR ) 120 MG 24 hr tablet Take 1 tablet (120 mg total) by mouth every morning. 11/24/22   Colin Dawley, MD  levothyroxine  (SYNTHROID ) 75 MCG tablet Take 75 mcg by mouth daily. 02/23/22   [provider]  meclizine (ANTIVERT) 25 MG tablet Take 25 mg by mouth 4 (four) times daily as needed for dizziness. 12/12/23   [provider]  memantine  (NAMENDA ) 10 MG tablet Take 10 mg by mouth 2 (two) times daily.  03/05/16   [provider]  methylPREDNISolone  (MEDROL  DOSEPAK) 4 MG TBPK tablet Take by mouth as directed. 03/02/24   [provider]  nitroGLYCERIN  (NITROSTAT ) 0.4 MG SL tablet Place 1 tablet (0.4 mg total) under the tongue every 5 (five) minutes as needed for chest pain. 11/24/22   Colin Dawley, MD  oxycodone  (ROXICODONE ) 30 MG immediate release tablet Take 30 mg by mouth every 6 (six) hours as needed for pain. 09/02/21   [provider]  pantoprazole  (PROTONIX ) 40 MG tablet Take 1 tablet (40 mg total) by mouth 2 (two) times daily for 14 days, THEN 1 tablet (40 mg total) daily. 02/14/23 02/28/24  Lasalle Pointer, NP  potassium chloride  SA  (KLOR-CON  M) 20 MEQ tablet One po bid x 3 days, then one po once a day 03/31/24   Steinl, Kevin, MD  promethazine  (PHENERGAN ) 25 MG tablet Take 25 mg by mouth 4 (four) times daily as needed for nausea or vomiting. 06/16/23   [provider]  QUEtiapine  (SEROQUEL ) 25 MG tablet Take 25 mg by mouth at bedtime as needed. 03/14/24   [provider]  ranolazine  (RANEXA ) 500 MG 12 hr tablet Take 500 mg by mouth 2 (two) times daily. 07/06/23   [provider]  risperiDONE  (RISPERDAL ) 0.25 MG tablet Take 0.25 mg by mouth at bedtime. 03/18/23   [provider]  rosuvastatin  (CRESTOR ) 40 MG tablet Take 1 tablet (40 mg total) by mouth daily at 6 PM. 02/24/24 03/25/24  Unk Garb, DO  tamsulosin  (FLOMAX ) 0.4 MG CAPS capsule Take 1 capsule (0.4 mg total) by mouth  daily for 14 days. 05/17/24 05/31/24  Teddi Favors, DO                                                                                                                                    Past Surgical History Past Surgical History:  Procedure Laterality Date   CATARACT EXTRACTION W/PHACO Left 03/22/2022   Procedure: CATARACT EXTRACTION PHACO AND INTRAOCULAR LENS PLACEMENT (IOC);  Surgeon: Tarri Farm, MD;  Location: [facility];  Service: Ophthalmology;  Laterality: Left;  CDE: 25.22   COLONOSCOPY  08/03/2004   Jenkins-numerous large diverticula in the descending, transverse, descending, and sigmoid colon. Otherwise normal exam.   COLONOSCOPY  07/12/2012   RMR: External hemorrhoidal tag; multiple rectal and colonic polyps removed and/or treated as described above. Pancolonic diverticulosis. Bx-tubular adenomas, rectal hyperplastic polyp. next colonoscopy in 06/2015.   COLONOSCOPY N/A 06/22/2016   Procedure: COLONOSCOPY;  Surgeon: Alanda Allegra, MD;  Location: [facility];  Service: Gastroenterology;  Laterality: N/A;  730   CORONARY ANGIOPLASTY WITH STENT PLACEMENT     CORONARY ARTERY BYPASS GRAFT  1996   LIMA to LAD, SVG to D2,  SVG to PDA, SVG to OM1 and OM2   CORONARY STENT INTERVENTION N/A 12/10/2019   Procedure: CORONARY STENT INTERVENTION;  Surgeon: Odie Benne, MD;  Location: [facility];  Service: Cardiovascular;  Laterality: N/A;   CORONARY ULTRASOUND/IVUS N/A 12/10/2019   Procedure: Intravascular Ultrasound/IVUS;  Surgeon: Odie Benne, MD;  Location: [facility];  Service: Cardiovascular;  Laterality: N/A;   ESOPHAGOGASTRODUODENOSCOPY N/A 03/16/2013   Procedure: ESOPHAGOGASTRODUODENOSCOPY (EGD);  Surgeon: Suzette Espy, MD;  Location: [facility];  Service: Endoscopy;  Laterality: N/A;  12:00-moved to 1030 Leigh Ann notified pt   ESOPHAGOGASTRODUODENOSCOPY N/A 03/06/2013   Procedure: ESOPHAGOGASTRODUODENOSCOPY (EGD);  Surgeon: Suzette Espy, MD;  Location: [facility];  Service: Endoscopy;  Laterality: N/A;   HERNIA REPAIR     LEFT HEART CATH AND CORS/GRAFTS ANGIOGRAPHY N/A 12/10/2019   Procedure: LEFT HEART CATH AND CORS/GRAFTS ANGIOGRAPHY;  Surgeon: Odie Benne, MD;  Location: [facility];  Service: Cardiovascular;  Laterality: N/A;   LEFT HEART CATH AND CORS/GRAFTS ANGIOGRAPHY N/A 03/02/2021   Procedure: LEFT HEART CATH AND CORS/GRAFTS ANGIOGRAPHY;  Surgeon: Sammy Crisp, MD;  Location: [facility];  Service: Cardiovascular;  Laterality: N/A;   Family History Family History  Problem Relation Age of Onset   Heart disease Other    Heart attack Mother    Colon cancer Neg Hx     Social History Social History   Tobacco Use   Smoking status: Former    Current packs/day: 0.00    Average packs/day: 2.0 packs/day for 40.0 years (80.0 ttl pk-yrs)    Types: Cigarettes    Start date: 12/27/1954    Quit date: 12/27/1994    Years since quitting: 29.4   Smokeless tobacco: Never  Vaping Use   Vaping status: Never Used  Substance Use Topics   Alcohol use: No    Alcohol/week: 0.0 standard drinks of alcohol   Drug use: No    Allergies Patient has no known allergies.  Review of Systems Review of Systems  All other systems reviewed and are negative.   Physical Exam Vital Signs  I have reviewed the triage vital signs BP (!) 145/82 (BP Location: Right Arm)   Pulse 74   Temp 98.1 F (36.7 C)   Resp 20   Ht 5\' 10"  (1.778 m)   Wt 95.4 kg   SpO2 91%   BMI 30.19 kg/m  Physical Exam Vitals and nursing note reviewed.  Constitutional:      General: He is not in acute distress.    Appearance: Normal appearance.  HENT:     Mouth/Throat:     Mouth: Mucous membranes are moist.  Eyes:     Conjunctiva/sclera: Conjunctivae normal.  Cardiovascular:     Rate and Rhythm: Normal rate and regular rhythm.  Pulmonary:     Effort: Pulmonary effort is normal. No respiratory distress.     Breath sounds: Normal breath sounds.  Abdominal:     General: Abdomen is flat.     Palpations: Abdomen is soft.     Tenderness: There is no abdominal tenderness.  Genitourinary:    Comments: Foley catheter in place Musculoskeletal:     Right lower leg: No edema.     Left lower leg: No edema.  Skin:    General: Skin is warm and dry.     Capillary Refill: Capillary refill takes less than 2 seconds.  Neurological:     Mental Status: He is alert and oriented to person, place, and time. Mental status is at baseline.  Psychiatric:        Mood and Affect: Mood normal.        Behavior: Behavior normal.     ED Results and Treatments Labs (all labs ordered are listed, but only abnormal results are displayed) Labs Reviewed  URINALYSIS, W/ REFLEX TO CULTURE (INFECTION SUSPECTED) - Abnormal; Notable for the following components:      Result Value   APPearance CLOUDY (*)    Hgb urine dipstick LARGE (*)    Protein, ur 100 (*)    Leukocytes,Ua LARGE (*)    Bacteria, UA FEW (*)    All other components within normal limits  URINE CULTURE                                                                                                                           Radiology No results found.  Pertinent labs & imaging results that were available during my care of the patient were reviewed by me and considered in my medical decision making (see MDM for details).  Medications Ordered in ED Medications  cefTRIAXone  (ROCEPHIN ) injection 1 g (has no administration in time range)  lidocaine  (PF) (XYLOCAINE ) 1 % injection  2 mL (has no administration in time range)                                                                                                                                     Procedures Procedures  (including critical care time)  Medical Decision Making / ED Course   MDM:  89 year old presenting requesting Foley catheter removal.  Patient well-appearing, no abdominal tenderness.  No fever.  Seems that the Foley catheter was triggered by constipation.  He reports that this is improved.  Discussed that we may need to replace the catheter if it is removed.  The patient willing to tolerate this.  Will have him drink fluids and remove the catheter.  If he is able to urinate will discharge with strict ER precautions.  Clinical Course as of 05/23/24 1449  Wed May 23, 2024  1448 Catheter was removed.  Patient was given fluids to drink.  Bladder scan was obtained after voiding, patient able to void about 100 mL with serial left in his bladder.  Urinalysis was sent and does show findings concerning for UTI so we will give treatment with ceftriaxone  and cefdinir.  Urine culture has been sent. Will discharge patient to home. All questions answered. Patient comfortable with plan of discharge. Return precautions discussed with patient and specified on the after visit summary.  [WS]    Clinical Course User Index [WS] Mordecai Applebaum, MD     Additional history obtained:  -External records from outside source obtained and reviewed including: Chart review including previous notes, labs, imaging, consultation  notes including recent ER visit    Lab Tests: -I ordered, reviewed, and interpreted labs.   The pertinent results include:   Labs Reviewed  URINALYSIS, W/ REFLEX TO CULTURE (INFECTION SUSPECTED) - Abnormal; Notable for the following components:      Result Value   APPearance CLOUDY (*)    Hgb urine dipstick LARGE (*)    Protein, ur 100 (*)    Leukocytes,Ua LARGE (*)    Bacteria, UA FEW (*)    All other components within normal limits  URINE CULTURE    Notable for signs of UTI   Medicines ordered and prescription drug management: Meds ordered this encounter  Medications   cefTRIAXone  (ROCEPHIN ) injection 1 g    Antibiotic Indication::   UTI   cefdinir (OMNICEF) 300 MG capsule    Sig: Take 1 capsule (300 mg total) by mouth 2 (two) times daily for 7 days.    Dispense:  14 capsule    Refill:  0   lidocaine  (PF) (XYLOCAINE ) 1 % injection 2 mL    -I have reviewed the patients home medicines and have made adjustments as needed   Social Determinants of Health:  Diagnosis or treatment significantly limited by social determinants of health: obesity   Reevaluation: After the interventions noted above, I reevaluated the patient and found that their symptoms  have improved  Co morbidities that complicate the patient evaluation  Past Medical History:  Diagnosis Date   Alzheimer disease (HCC)    Anxiety    Arthritis    Atrophic gastritis    a. By EGD 02/2013.   Carotid artery disease (HCC)    a. mild-mod plaque, <50% stenosis bilat by duplex 2018.   Coronary atherosclerosis of native coronary artery    a. Multivessel s/p CABG 1996. b. DESx2 to SVG-PDA in 2012 c. DES to distal LM in 11/2019 d. cath in 02/2021 showing patent LM stent, patent LIMA-LAD, patent SVG-OM1 with chronically occluded jump limb to OM2 and chronic occlusion of SVG-D2 and SVG-PDA and no targets for intervention   DDD (degenerative disc disease)    Chronic back pain   Dementia (HCC)    Enlarged prostate     Essential hypertension    Hematuria    History of radiation therapy    Left Lung- 04/26/23-Dr. Retta Caster   Hypothyroidism    LBBB (left bundle branch block)    MI (myocardial infarction) (HCC)    Mixed hyperlipidemia    OA (osteoarthritis)    OSA (obstructive sleep apnea)    Pneumonia due to COVID-19 virus    February 2021   Sinus bradycardia    a. Aricept  and BB discontinued due to this.      Dispostion: Disposition decision including need for hospitalization was considered, and patient discharged from emergency department.    Final Clinical Impression(s) / ED Diagnoses Final diagnoses:  Urinary tract infection associated with indwelling urethral catheter, initial encounter [facility])     This chart was dictated using voice recognition software.  Despite best efforts to proofread,  errors can occur which can change the documentation meaning.    Mordecai Applebaum, MD 05/23/24 [phone]

## 2024-05-23 NOTE — ED Notes (Signed)
 Pt was able to void post removal of cathter

## 2024-05-23 NOTE — ED Notes (Signed)
 Foley catheter removed without difficulty. Patient tolerated procedure. Po intake adequate, pt drinking 8 oz of water .

## 2024-05-24 ENCOUNTER — Ambulatory Visit: Payer: Self-pay | Admitting: Medical

## 2024-05-25 DIAGNOSIS — R339 Retention of urine, unspecified: Secondary | ICD-10-CM | POA: Diagnosis not present

## 2024-05-25 DIAGNOSIS — M4302 Spondylolysis, cervical region: Secondary | ICD-10-CM | POA: Diagnosis not present

## 2024-05-25 DIAGNOSIS — N390 Urinary tract infection, site not specified: Secondary | ICD-10-CM | POA: Diagnosis not present

## 2024-05-25 DIAGNOSIS — I5022 Chronic systolic (congestive) heart failure: Secondary | ICD-10-CM | POA: Diagnosis not present

## 2024-05-25 DIAGNOSIS — M48061 Spinal stenosis, lumbar region without neurogenic claudication: Secondary | ICD-10-CM | POA: Diagnosis not present

## 2024-05-25 DIAGNOSIS — G894 Chronic pain syndrome: Secondary | ICD-10-CM | POA: Diagnosis not present

## 2024-05-25 DIAGNOSIS — K5909 Other constipation: Secondary | ICD-10-CM | POA: Diagnosis not present

## 2024-05-25 DIAGNOSIS — T50905A Adverse effect of unspecified drugs, medicaments and biological substances, initial encounter: Secondary | ICD-10-CM | POA: Diagnosis not present

## 2024-05-25 DIAGNOSIS — F1120 Opioid dependence, uncomplicated: Secondary | ICD-10-CM | POA: Diagnosis not present

## 2024-05-25 LAB — URINE CULTURE: Culture: >=100000 — AB

## 2024-05-26 ENCOUNTER — Telehealth (HOSPITAL_BASED_OUTPATIENT_CLINIC_OR_DEPARTMENT_OTHER): Payer: Self-pay | Admitting: *Deleted

## 2024-05-26 NOTE — Telephone Encounter (Signed)
 Post ED Visit - Positive Culture Follow-up: Successful Patient Follow-Up  Culture assessed and recommendations reviewed by:  []  Court Distance, Pharm.D. []  Skeet Duke, Pharm.D., BCPS AQ-ID []  Leslee Rase, Pharm.D., BCPS []  Garland Junk, Pharm.D., BCPS []  [place], [address].D., BCPS, AAHIVP []  Alcide Aly, Pharm.D., BCPS, AAHIVP []  Jerri Morale, PharmD, BCPS []  Graham Laws, PharmD, BCPS []  Cleda Curly, PharmD, BCPS [x]  Trinidad Funk, PharmD  Positive urine culture  D/C cefdinir . No treatment needed  Contacted patient, date 05/26/24, time 1125   Dustin [name] 05/26/2024, 11:24 AM

## 2024-05-27 NOTE — Progress Notes (Signed)
 Dustin [name], male    DOB: 04/16/1934    MRN: [id number]  Brief patient profile:  5  yowm  quit smoking 1996 p MI  former [facility] patient self-referred back to pulmonary clinic in [place]  04/16/2024  for cp/sob/ cough  - no pfts on file, no response to bronchodilators clinically   L CP onset a few weeks of finishing radiation/ just below L scapula more positional than pleuritic assoc with severe constipation (RT notes say pain reported same location p cabg = baseline, pt denies)   PET pos/ spn/ presumed ca  Indication for treatment: Curative       Radiation treatment dates: 04/26/23 through 05/02/23 Site/dose: Left Lung - 54 Gy delivered in 3 Fx at 18 Gy/Fx Beams/energy:  6X-FFF Technique/Mode: SBRT/SRT-IMRT, Photon    History of Present Illness  04/16/2024  Pulmonary/ 1st office eval/ Amahd Morino / [place] [facility]  Chief Complaint  Patient presents with   COPD        Shortness of Breath  Dyspnea:  riding scooter at grocery store x 6 m  Cough: none now / cp not worse diet cough / oxycodone  helps / has gabapentin  but only using 100 mg per day  Min production pale green  Sleep: 30 degrees HOB  SABA use: inhalers not much change in doe, helps him cough up mucus somewhat   02 use: 2lpm HS and prn Rec Pain pattern suggests ibs:   Treatment consists of avoiding foods that cause gas (especially boiled eggs, mexcican food but especially  beans and undercooked vegetables like  spinach and some salads)  and citrucel 1 heaping tbsp twice daily with a large glass of water .  Pain should improve w/in 2 weeks and if not then consider further GI work up.    Gabapentin  100 mg four times daily for now to see if it results in less needed for oxycodone  Zostrix cream can be applied just on the area of the pain 4 x daily  Make sure you check your oxygen  saturation  AT  your highest level of activity (not after you stop)   to be sure it stays over 90%  Please schedule a follow up office visit in 6  weeks, call sooner if needed      05/30/2024  f/u ov/[place] office/Coralynn Gaona re:  CP  ? IBS / ? Neuralgia post cabg  maint on "can't  say" but cp resolved to his satisfaction  Chief Complaint  Patient presents with   COPD   Shortness of Breath   Dyspnea:  has not walked to mb and back in a month  - limited intermittent by L groin pain (worse when walks)  Cough: none  Sleeping: recliner x 5 year less 30 degrees  s  resp cc  SABA use: "I got 3 or 4 not using" 02: 2lpm hs only not using daytime  No obvious day to day or daytime variability or assoc excess/ purulent sputum or mucus plugs or hemoptysis or cp or chest tightness, subjective wheeze or overt   hb symptoms.   Also denies any obvious fluctuation of symptoms with weather or environmental changes or other aggravating or alleviating factors except as outlined above   No unusual exposure hx or h/o childhood pna/ asthma or knowledge of premature birth.  Current Allergies, Complete Past Medical History, Past Surgical History, Family History, and Social History were reviewed in [employer] record.  ROS  The following are not active complaints unless bolded Hoarseness, sore  throat, dysphagia, dental problems, itching, sneezing,  nasal congestion or discharge of excess mucus or purulent secretions, ear ache,   fever, chills, sweats, unintended wt loss or wt gain, classically pleuritic or exertional cp,  orthopnea pnd or arm/hand swelling  or leg swelling, presyncope, palpitations, abdominal pain, anorexia, nausea, vomiting, diarrhea  or change in bowel habits or change in bladder habits, change in stools or change in urine, dysuria, hematuria,  rash, arthralgias, visual complaints, headache, numbness, weakness or ataxia or problems with walking or coordination,  change in mood or  memory.        Current Meds- - NOTE:   Unable to verify as accurately reflecting what pt takes     Medication Sig   albuterol  (PROVENTIL )  (2.5 MG/3ML) 0.083% nebulizer solution Take 3 mLs (2.5 mg total) by nebulization every 2 (two) hours as needed for wheezing or shortness of breath.   albuterol  (VENTOLIN  HFA) 108 (90 Base) MCG/ACT inhaler Inhale 2 puffs into the lungs every 6 (six) hours as needed for wheezing or shortness of breath.   Budeson-Glycopyrrol-Formoterol  (BREZTRI  AEROSPHERE) 160-9-4.8 MCG/ACT AERO Inhale 2 puffs into the lungs in the morning and at bedtime.   clopidogrel  (PLAVIX ) 75 MG tablet Take 75 mg by mouth daily.   DULoxetine  (CYMBALTA ) 30 MG capsule Take 30 mg by mouth daily.   escitalopram  (LEXAPRO ) 20 MG tablet Take 20 mg by mouth every morning.    furosemide  (LASIX ) 20 MG tablet Take 1 tablet (20 mg total) by mouth 2 (two) times daily. For fluid   gabapentin  (NEURONTIN ) 100 MG capsule Take 100 mg by mouth 3 (three) times daily.   isosorbide  mononitrate (IMDUR ) 120 MG 24 hr tablet Take 1 tablet (120 mg total) by mouth every morning.   levothyroxine  (SYNTHROID ) 75 MCG tablet Take 75 mcg by mouth daily.   memantine  (NAMENDA ) 10 MG tablet Take 10 mg by mouth 2 (two) times daily.    nitroGLYCERIN  (NITROSTAT ) 0.4 MG SL tablet Place 1 tablet (0.4 mg total) under the tongue every 5 (five) minutes as needed for chest pain.   QUEtiapine  (SEROQUEL ) 25 MG tablet Take 25 mg by mouth at bedtime as needed.   ranolazine  (RANEXA ) 500 MG 12 hr tablet Take 500 mg by mouth 2 (two) times daily.   risperiDONE  (RISPERDAL ) 0.25 MG tablet Take 0.25 mg by mouth at bedtime.   [DISCONTINUED] meclizine (ANTIVERT) 25 MG tablet Take 25 mg by mouth 4 (four) times daily as needed for dizziness.   [DISCONTINUED] methylPREDNISolone  (MEDROL  DOSEPAK) 4 MG TBPK tablet Take by mouth as directed.   [DISCONTINUED] oxycodone  (ROXICODONE ) 30 MG immediate release tablet Take 30 mg by mouth every 6 (six) hours as needed for pain.   [DISCONTINUED] potassium chloride  SA (KLOR-CON  M) 20 MEQ tablet One po bid x 3 days, then one po once a day    [DISCONTINUED] promethazine  (PHENERGAN ) 25 MG tablet Take 25 mg by mouth 4 (four) times daily as needed for nausea or vomiting.             Past Medical History:  Diagnosis Date   Alzheimer disease (HCC)    Anxiety    Arthritis    Atrophic gastritis    a. By EGD 02/2013.   Carotid artery disease (HCC)    a. mild-mod plaque, <50% stenosis bilat by duplex 2018.   Coronary atherosclerosis of native coronary artery    a. Multivessel s/p CABG 1996. b. DESx2 to SVG-PDA in 2012 c. DES to distal LM in 11/2019  d. cath in 02/2021 showing patent LM stent, patent LIMA-LAD, patent SVG-OM1 with chronically occluded jump limb to OM2 and chronic occlusion of SVG-D2 and SVG-PDA and no targets for intervention   DDD (degenerative disc disease)    Chronic back pain   Dementia (HCC)    Enlarged prostate    Essential hypertension    Hematuria    History of radiation therapy    Left Lung- 04/26/23-Dr. Retta Caster   Hypothyroidism    LBBB (left bundle branch block)    MI (myocardial infarction) (HCC)    Mixed hyperlipidemia    OA (osteoarthritis)    OSA (obstructive sleep apnea)    Pneumonia due to COVID-19 virus    February 2021   Sinus bradycardia    a. Aricept  and BB discontinued due to this.      Objective:     Wt Readings from Last 3 Encounters:  05/30/24 216 lb (98 kg)  05/23/24 210 lb 6.4 oz (95.4 kg)  05/22/24 210 lb 6.4 oz (95.4 kg)      Vital signs reviewed  05/30/2024  - Note at rest 02 sats  90% on RA   General appearance:    amb elderly wm easily confused with details of care    HEENT : Oropharynx  clear   Nasal turbinates nl    NECK :  without  apparent JVD/ palpable Nodes/TM    LUNGS: no acc muscle use,  Min barrel  contour chest wall with bilateral  slightly decreased bs s audible wheeze and  without cough on insp or exp maneuvers and min  Hyperresonant  to  percussion bilaterally    CV:  RRR  no s3 or murmur or increase in P2, and no edema   ABD:  soft and  nontender with pos end  insp Hoover's  in the supine position.  No bruits or organomegaly appreciated   MS:  awkward gait/ ext warm without deformities Or obvious joint restrictions  calf tenderness, cyanosis or clubbing     SKIN: warm and dry without lesions    NEURO:  alert, approp, nl sensorium with  no motor or cerebellar deficits apparent.                I personally reviewed images and agree with radiology impression as follows:   Chest CT w/o contrast    03/26/24  1. Post radiation changes regionally within the left upper lobe and superior segment of the left lower lobe. No definite evidence of local disease progression or metastatic disease. 2. Mild right basilar ground-glass pulmonary infiltrate is nonspecific, possibly infectious or inflammatory in the acute setting or reflective of post inflammatory fibrotic change. 3. Status post coronary artery bypass grafting.      Assessment

## 2024-05-30 ENCOUNTER — Ambulatory Visit (INDEPENDENT_AMBULATORY_CARE_PROVIDER_SITE_OTHER): Admitting: Internal Medicine

## 2024-05-30 ENCOUNTER — Encounter: Payer: Self-pay | Admitting: Internal Medicine

## 2024-05-30 VITALS — BP 113/63 | HR 68 | Ht 70.0 in | Wt 216.0 lb

## 2024-05-30 DIAGNOSIS — J9611 Chronic respiratory failure with hypoxia: Secondary | ICD-10-CM | POA: Diagnosis not present

## 2024-05-30 DIAGNOSIS — R0789 Other chest pain: Secondary | ICD-10-CM

## 2024-05-30 DIAGNOSIS — G8929 Other chronic pain: Secondary | ICD-10-CM

## 2024-05-30 NOTE — Patient Instructions (Signed)
 Continue 02 2lpm at bedtime   If you feel you are being held back from doing what you want due to your breathing, please return with all your breathing medicines in hand I will see what I can do to help

## 2024-05-30 NOTE — Assessment & Plan Note (Addendum)
 Reports dates back to RT completed 10/2023 L post segment nodule (RT notes say related to cabg)  - 04/16/2024 trial of citrcel/ gabapentin  and zostrix >>> resolved as of 05/30/2024 (not really clear which instructions he followed) > no further w/u planned

## 2024-06-02 NOTE — Assessment & Plan Note (Signed)
 Rx as of 04/16/2024    = 2lpm hs and prn daytime  - 05/30/2024   Walked on RA  x  2 lap(s) =  approx 300  ft  @ mod pace, stopped due to groin pain with lowest 02 sats 89% s sob   Presently limited by groin pain  - if becomes more active he might desaturate in which case he needs a trial of portable 02 and pfts but was not interested in pursuing either of these and no longer focused on chronic CP (which may just have been IBS/ splenic flexure syndrome anyway ) but by limiting groin pain likely orthopedic in nature with f/u planned   Pulmonary f/u can be prn   Each maintenance medication was reviewed in detail including emphasizing most importantly the difference between maintenance and prns and under what circumstances the prns are to be triggered using an action plan format where appropriate.  Total time for H and P, chart review, counseling,  directly observing portions of ambulatory 02 saturation study/ and generating customized AVS unique to this office visit / same day charting = 31 min  summary final f/u ov

## 2024-06-19 NOTE — Progress Notes (Deleted)
 Name: Dustin [name] DOB: 06-Jun-1934 MRN: [id number]  History of Present Illness: Dustin [name] is a 89 y.o. male who presents today for follow up visit at [facility]. He resides at *** and is accompanied by ***, who assists with providing history due to patient's Alzheimer's dementia. Relevant History includes: 1. BPH with LUTS (nocturia and hesitancy).   At last visit with Dr. Sherrilee on 03/10/2022: Advised to continue Uroxatral  10mg  and Avodart  0.5mg  daily. ***restart - not on these anymore??  Since last visit: > 05/17/2024:  - Seen in ER for urinary retention and constipation. History of fecal impaction 1 month prior; was advised to reduce opiate medication use at that time.  - CT abdomen/pelvis w/ contrast showe bilateral renal cysts and mild renal atrophy. No GU stones, masses, or hydronephrosis; bladder unremarkable. - Foley catheter placed. - Had large BM after enema, discussed bowel protocol for home.  > 05/23/2024:  - Returned to ER for Foley catheter issue. He reports he is having discomfort and pain and sensation of urinary urgency and would like it to be removed. He reports his constipation has improved and he thinks he will be able to urinate. - Catheter was removed. Patient was given fluids to drink. Bladder scan was obtained after voiding, patient able to void about 100 mL with serial left in his bladder. Urinalysis was sent and does show findings concerning for UTI so we will give treatment with ceftriaxone  and cefdinir . - Urine culture positive for Serratia marcescens.  Today: He {Actions; denies-reports:120008} urinary urgency, frequency, nocturia x***, dysuria, gross hematuria, ***weak urinary stream, hesitancy, straining to void, or sensations of incomplete emptying.  He {Actions; denies-reports:120008} constipation. He {Actions; denies-reports:120008} flank pain or abdominal pain.  Medications: Current Outpatient Medications  Medication  Sig Dispense Refill   albuterol  (PROVENTIL ) (2.5 MG/3ML) 0.083% nebulizer solution Take 3 mLs (2.5 mg total) by nebulization every 2 (two) hours as needed for wheezing or shortness of breath. 75 mL 3   albuterol  (VENTOLIN  HFA) 108 (90 Base) MCG/ACT inhaler Inhale 2 puffs into the lungs every 6 (six) hours as needed for wheezing or shortness of breath. 18 g 3   aspirin  EC 81 MG tablet Take 1 tablet (81 mg total) by mouth daily. Swallow whole. (Patient not taking: Reported on 05/30/2024)     Budeson-Glycopyrrol-Formoterol  (BREZTRI  AEROSPHERE) 160-9-4.8 MCG/ACT AERO Inhale 2 puffs into the lungs in the morning and at bedtime. 10.7 g 5   clopidogrel  (PLAVIX ) 75 MG tablet Take 75 mg by mouth daily.     DULoxetine  (CYMBALTA ) 30 MG capsule Take 30 mg by mouth daily.     escitalopram  (LEXAPRO ) 20 MG tablet Take 20 mg by mouth every morning.      furosemide  (LASIX ) 20 MG tablet Take 1 tablet (20 mg total) by mouth 2 (two) times daily. For fluid 60 tablet 5   gabapentin  (NEURONTIN ) 100 MG capsule Take 100 mg by mouth 3 (three) times daily.     isosorbide  mononitrate (IMDUR ) 120 MG 24 hr tablet Take 1 tablet (120 mg total) by mouth every morning. 30 tablet 11   levothyroxine  (SYNTHROID ) 75 MCG tablet Take 75 mcg by mouth daily.     memantine  (NAMENDA ) 10 MG tablet Take 10 mg by mouth 2 (two) times daily.      nitroGLYCERIN  (NITROSTAT ) 0.4 MG SL tablet Place 1 tablet (0.4 mg total) under the tongue every 5 (five) minutes as needed for chest pain. 15 tablet 5   QUEtiapine  (SEROQUEL ) 25  MG tablet Take 25 mg by mouth at bedtime as needed.     ranolazine  (RANEXA ) 500 MG 12 hr tablet Take 500 mg by mouth 2 (two) times daily.     risperiDONE  (RISPERDAL ) 0.25 MG tablet Take 0.25 mg by mouth at bedtime.     rosuvastatin  (CRESTOR ) 40 MG tablet Take 1 tablet (40 mg total) by mouth daily at 6 PM. 30 tablet 5   No current facility-administered medications for this visit.    Allergies: No Known Allergies  Past Medical  History:  Diagnosis Date   Alzheimer disease (HCC)    Anxiety    Arthritis    Atrophic gastritis    a. By EGD 02/2013.   Carotid artery disease (HCC)    a. mild-mod plaque, <50% stenosis bilat by duplex 2018.   Coronary atherosclerosis of native coronary artery    a. Multivessel s/p CABG 1996. b. DESx2 to SVG-PDA in 2012 c. DES to distal LM in 11/2019 d. cath in 02/2021 showing patent LM stent, patent LIMA-LAD, patent SVG-OM1 with chronically occluded jump limb to OM2 and chronic occlusion of SVG-D2 and SVG-PDA and no targets for intervention   DDD (degenerative disc disease)    Chronic back pain   Dementia (HCC)    Enlarged prostate    Essential hypertension    Hematuria    History of radiation therapy    Left Lung- 04/26/23-Dr. Lynwood Nasuti   Hypothyroidism    LBBB (left bundle branch block)    MI (myocardial infarction) (HCC)    Mixed hyperlipidemia    OA (osteoarthritis)    OSA (obstructive sleep apnea)    Pneumonia due to COVID-19 virus    February 2021   Sinus bradycardia    a. Aricept  and BB discontinued due to this.   Past Surgical History:  Procedure Laterality Date   CATARACT EXTRACTION W/PHACO Left 03/22/2022   Procedure: CATARACT EXTRACTION PHACO AND INTRAOCULAR LENS PLACEMENT (IOC);  Surgeon: Harrie Lynwood, MD;  Location: [facility];  Service: Ophthalmology;  Laterality: Left;  CDE: 25.22   COLONOSCOPY  08/03/2004   Jenkins-numerous large diverticula in the descending, transverse, descending, and sigmoid colon. Otherwise normal exam.   COLONOSCOPY  07/12/2012   RMR: External hemorrhoidal tag; multiple rectal and colonic polyps removed and/or treated as described above. Pancolonic diverticulosis. Bx-tubular adenomas, rectal hyperplastic polyp. next colonoscopy in 06/2015.   COLONOSCOPY N/A 06/22/2016   Procedure: COLONOSCOPY;  Surgeon: Oneil Budge, MD;  Location: [facility];  Service: Gastroenterology;  Laterality: N/A;  730   CORONARY ANGIOPLASTY WITH STENT PLACEMENT      CORONARY ARTERY BYPASS GRAFT  1996   LIMA to LAD, SVG to D2, SVG to PDA, SVG to OM1 and OM2   CORONARY STENT INTERVENTION N/A 12/10/2019   Procedure: CORONARY STENT INTERVENTION;  Surgeon: Verlin Lonni BIRCH, MD;  Location: [facility];  Service: Cardiovascular;  Laterality: N/A;   CORONARY ULTRASOUND/IVUS N/A 12/10/2019   Procedure: Intravascular Ultrasound/IVUS;  Surgeon: Verlin Lonni BIRCH, MD;  Location: [facility];  Service: Cardiovascular;  Laterality: N/A;   ESOPHAGOGASTRODUODENOSCOPY N/A 03/16/2013   Procedure: ESOPHAGOGASTRODUODENOSCOPY (EGD);  Surgeon: Lamar CHRISTELLA Hollingshead, MD;  Location: [facility];  Service: Endoscopy;  Laterality: N/A;  12:00-moved to 1030 Leigh Ann notified pt   ESOPHAGOGASTRODUODENOSCOPY N/A 03/06/2013   Procedure: ESOPHAGOGASTRODUODENOSCOPY (EGD);  Surgeon: Lamar CHRISTELLA Hollingshead, MD;  Location: [facility];  Service: Endoscopy;  Laterality: N/A;   HERNIA REPAIR     LEFT HEART CATH AND  CORS/GRAFTS ANGIOGRAPHY N/A 12/10/2019   Procedure: LEFT HEART CATH AND CORS/GRAFTS ANGIOGRAPHY;  Surgeon: Verlin Lonni BIRCH, MD;  Location: [facility];  Service: Cardiovascular;  Laterality: N/A;   LEFT HEART CATH AND CORS/GRAFTS ANGIOGRAPHY N/A 03/02/2021   Procedure: LEFT HEART CATH AND CORS/GRAFTS ANGIOGRAPHY;  Surgeon: Mady Lonni, MD;  Location: [facility];  Service: Cardiovascular;  Laterality: N/A;   Family History  Problem Relation Age of Onset   Heart disease Other    Heart attack Mother    Colon cancer Neg Hx    Social History   Socioeconomic History   Marital status: Married    Spouse name: Not on file   Number of children: 3   Years of education: Not on file   Highest education level: Not on file  Occupational History   Occupation: retired    Associate Professor: RETIRED  Tobacco Use   Smoking status: Former    Current packs/day: 0.00    Average packs/day: 2.0 packs/day for 40.0 years (80.0 ttl pk-yrs)    Types: Cigarettes     Start date: 12/27/1954    Quit date: 12/27/1994    Years since quitting: 29.4   Smokeless tobacco: Never  Vaping Use   Vaping status: Never Used  Substance and Sexual Activity   Alcohol use: No    Alcohol/week: 0.0 standard drinks of alcohol   Drug use: No   Sexual activity: Not Currently  Other Topics Concern   Not on file  Social History Narrative   Lives w/ ailing wife.   Son brings meals 3x per week   Social Drivers of Health   Financial Resource Strain: Not on file  Food Insecurity: No Food Insecurity (02/21/2024)   Hunger Vital Sign    Worried About Running Out of Food in the Last Year: Never true    Ran Out of Food in the Last Year: Never true  Transportation Needs: No Transportation Needs (02/21/2024)   PRAPARE - Administrator, Civil Service (Medical): No    Lack of Transportation (Non-Medical): No  Physical Activity: Not on file  Stress: Not on file  Social Connections: Moderately Isolated (02/21/2024)   Social Connection and Isolation Panel    Frequency of Communication with Friends and Family: More than three times a week    Frequency of Social Gatherings with Friends and Family: Once a week    Attends Religious Services: Never    Database administrator or Organizations: No    Attends Banker Meetings: Never    Marital Status: Married  Catering manager Violence: Not At Risk (02/21/2024)   Humiliation, Afraid, Rape, and Kick questionnaire    Fear of Current or Ex-Partner: No    Emotionally Abused: No    Physically Abused: No    Sexually Abused: No    Review of Systems Constitutional: Patient denies any unintentional weight loss or change in strength lntegumentary: Patient denies any rashes or pruritus Cardiovascular: Patient denies chest pain or syncope Respiratory: Patient denies shortness of breath Gastrointestinal: ***Patient denies nausea, vomiting, constipation, or diarrhea ***As per HPI Musculoskeletal: Patient denies muscle  cramps or weakness Neurologic: Patient denies convulsions or seizures Allergic/Immunologic: Patient denies recent allergic reaction(s) Hematologic/Lymphatic: Patient denies bleeding tendencies Endocrine: Patient denies heat/cold intolerance  GU: As per HPI.  OBJECTIVE There were no vitals filed for this visit. There is no height or weight on file to calculate BMI.  Physical Examination Constitutional: No obvious distress; patient is non-toxic appearing  Cardiovascular: No visible lower extremity edema.  Respiratory: The patient does not have audible wheezing/stridor; respirations do not appear labored  Gastrointestinal: Abdomen non-distended Musculoskeletal: Normal ROM of UEs  Skin: No obvious rashes/open sores  Neurologic: CN 2-12 grossly intact Psychiatric: Answered questions appropriately with normal affect  Hematologic/Lymphatic/Immunologic: No obvious bruises or sites of spontaneous bleeding  UA: ***negative ***positive for *** leukocytes, *** blood, ***nitrites Urine microscopy: *** WBC/hpf, *** RBC/hpf, *** bacteria ***glucosuria (secondary to ***Jardiance ***Farxiga use) ***otherwise unremarkable  PVR: *** ml  ASSESSMENT No diagnosis found. ***  We agreed to plan for follow up in *** months / ***1 year or sooner if needed. Patient verbalized understanding of and agreement with current plan. All questions were answered.  PLAN Advised the following: 1. *** 2. ***No follow-ups on file.  No orders of the defined types were placed in this encounter.   It has been explained that the patient is to follow regularly with their PCP in addition to all other providers involved in their care and to follow instructions provided by these respective offices. Patient advised to contact urology clinic if any urologic-pertaining questions, concerns, new symptoms or problems arise in the interim period.  There are no Patient Instructions on file for this visit.  Electronically  signed by:  Lauraine JAYSON Oz, FNP   06/19/24    3:45 PM

## 2024-06-20 ENCOUNTER — Ambulatory Visit: Admitting: Urology

## 2024-06-20 DIAGNOSIS — Z87898 Personal history of other specified conditions: Secondary | ICD-10-CM

## 2024-06-20 DIAGNOSIS — F0280 Dementia in other diseases classified elsewhere without behavioral disturbance: Secondary | ICD-10-CM

## 2024-06-20 DIAGNOSIS — N138 Other obstructive and reflux uropathy: Secondary | ICD-10-CM

## 2024-06-20 DIAGNOSIS — K5909 Other constipation: Secondary | ICD-10-CM

## 2024-06-20 DIAGNOSIS — R339 Retention of urine, unspecified: Secondary | ICD-10-CM

## 2024-06-20 DIAGNOSIS — R531 Weakness: Secondary | ICD-10-CM

## 2024-06-25 DIAGNOSIS — I700 Atherosclerosis of aorta: Secondary | ICD-10-CM | POA: Diagnosis not present

## 2024-06-25 DIAGNOSIS — J449 Chronic obstructive pulmonary disease, unspecified: Secondary | ICD-10-CM | POA: Diagnosis not present

## 2024-06-28 ENCOUNTER — Ambulatory Visit (HOSPITAL_COMMUNITY)

## 2024-06-28 ENCOUNTER — Ambulatory Visit (HOSPITAL_COMMUNITY): Admission: RE | Admit: 2024-06-28 | Source: Ambulatory Visit

## 2024-06-30 ENCOUNTER — Ambulatory Visit (HOSPITAL_COMMUNITY): Admit: 2024-06-30 | Admitting: Internal Medicine

## 2024-06-30 ENCOUNTER — Observation Stay (HOSPITAL_COMMUNITY)
Admission: EM | Admit: 2024-06-30 | Discharge: 2024-07-01 | Disposition: A | Discharge status: Home / Self Care | Attending: Internal Medicine | Admitting: Internal Medicine

## 2024-06-30 ENCOUNTER — Other Ambulatory Visit: Payer: Self-pay

## 2024-06-30 ENCOUNTER — Emergency Department (HOSPITAL_COMMUNITY)

## 2024-06-30 ENCOUNTER — Encounter (HOSPITAL_COMMUNITY)
Admission: EM | Disposition: A | Discharge status: Home / Self Care | Payer: Self-pay | Source: Home / Self Care | Attending: Emergency Medicine

## 2024-06-30 ENCOUNTER — Encounter (HOSPITAL_COMMUNITY): Payer: Self-pay | Admitting: *Deleted

## 2024-06-30 DIAGNOSIS — J439 Emphysema, unspecified: Secondary | ICD-10-CM | POA: Diagnosis not present

## 2024-06-30 DIAGNOSIS — Z7902 Long term (current) use of antithrombotics/antiplatelets: Secondary | ICD-10-CM | POA: Diagnosis not present

## 2024-06-30 DIAGNOSIS — Z951 Presence of aortocoronary bypass graft: Secondary | ICD-10-CM | POA: Diagnosis not present

## 2024-06-30 DIAGNOSIS — I517 Cardiomegaly: Secondary | ICD-10-CM | POA: Diagnosis not present

## 2024-06-30 DIAGNOSIS — F0280 Dementia in other diseases classified elsewhere without behavioral disturbance: Secondary | ICD-10-CM | POA: Diagnosis present

## 2024-06-30 DIAGNOSIS — K222 Esophageal obstruction: Secondary | ICD-10-CM | POA: Insufficient documentation

## 2024-06-30 DIAGNOSIS — Z955 Presence of coronary angioplasty implant and graft: Secondary | ICD-10-CM | POA: Insufficient documentation

## 2024-06-30 DIAGNOSIS — S2232XA Fracture of one rib, left side, initial encounter for closed fracture: Secondary | ICD-10-CM | POA: Diagnosis not present

## 2024-06-30 DIAGNOSIS — E039 Hypothyroidism, unspecified: Secondary | ICD-10-CM | POA: Diagnosis not present

## 2024-06-30 DIAGNOSIS — I2510 Atherosclerotic heart disease of native coronary artery without angina pectoris: Secondary | ICD-10-CM | POA: Diagnosis not present

## 2024-06-30 DIAGNOSIS — I10 Essential (primary) hypertension: Secondary | ICD-10-CM | POA: Diagnosis not present

## 2024-06-30 DIAGNOSIS — E785 Hyperlipidemia, unspecified: Secondary | ICD-10-CM | POA: Diagnosis not present

## 2024-06-30 DIAGNOSIS — J9621 Acute and chronic respiratory failure with hypoxia: Secondary | ICD-10-CM | POA: Diagnosis not present

## 2024-06-30 DIAGNOSIS — F0390 Unspecified dementia without behavioral disturbance: Secondary | ICD-10-CM | POA: Diagnosis not present

## 2024-06-30 DIAGNOSIS — J449 Chronic obstructive pulmonary disease, unspecified: Secondary | ICD-10-CM | POA: Diagnosis not present

## 2024-06-30 DIAGNOSIS — T18128A Food in esophagus causing other injury, initial encounter: Secondary | ICD-10-CM

## 2024-06-30 DIAGNOSIS — G309 Alzheimer's disease, unspecified: Secondary | ICD-10-CM | POA: Diagnosis not present

## 2024-06-30 DIAGNOSIS — I110 Hypertensive heart disease with heart failure: Secondary | ICD-10-CM | POA: Diagnosis not present

## 2024-06-30 DIAGNOSIS — R1310 Dysphagia, unspecified: Principal | ICD-10-CM | POA: Insufficient documentation

## 2024-06-30 DIAGNOSIS — Z87891 Personal history of nicotine dependence: Secondary | ICD-10-CM | POA: Insufficient documentation

## 2024-06-30 DIAGNOSIS — R918 Other nonspecific abnormal finding of lung field: Secondary | ICD-10-CM | POA: Diagnosis not present

## 2024-06-30 DIAGNOSIS — R0602 Shortness of breath: Secondary | ICD-10-CM | POA: Diagnosis not present

## 2024-06-30 DIAGNOSIS — J189 Pneumonia, unspecified organism: Secondary | ICD-10-CM | POA: Diagnosis not present

## 2024-06-30 DIAGNOSIS — W44F3XA Food entering into or through a natural orifice, initial encounter: Secondary | ICD-10-CM

## 2024-06-30 DIAGNOSIS — Z79899 Other long term (current) drug therapy: Secondary | ICD-10-CM | POA: Diagnosis not present

## 2024-06-30 DIAGNOSIS — J690 Pneumonitis due to inhalation of food and vomit: Secondary | ICD-10-CM

## 2024-06-30 DIAGNOSIS — Z7982 Long term (current) use of aspirin: Secondary | ICD-10-CM | POA: Diagnosis not present

## 2024-06-30 DIAGNOSIS — I5032 Chronic diastolic (congestive) heart failure: Secondary | ICD-10-CM | POA: Diagnosis present

## 2024-06-30 DIAGNOSIS — G4733 Obstructive sleep apnea (adult) (pediatric): Secondary | ICD-10-CM | POA: Diagnosis not present

## 2024-06-30 DIAGNOSIS — R0789 Other chest pain: Secondary | ICD-10-CM | POA: Diagnosis not present

## 2024-06-30 DIAGNOSIS — I509 Heart failure, unspecified: Secondary | ICD-10-CM | POA: Diagnosis not present

## 2024-06-30 DIAGNOSIS — I700 Atherosclerosis of aorta: Secondary | ICD-10-CM | POA: Diagnosis not present

## 2024-06-30 HISTORY — PX: ESOPHAGOGASTRODUODENOSCOPY: SHX5428

## 2024-06-30 LAB — CBC WITH DIFFERENTIAL/PLATELET
Abs Immature Granulocytes: 0.08 K/uL — ABNORMAL HIGH (ref 0.00–0.07)
Basophils Absolute: 0 K/uL (ref 0.0–0.1)
Basophils Relative: 0 %
Eosinophils Absolute: 0.1 K/uL (ref 0.0–0.5)
Eosinophils Relative: 0 %
HCT: 40.5 % (ref 39.0–52.0)
Hemoglobin: 12.9 g/dL — ABNORMAL LOW (ref 13.0–17.0)
Immature Granulocytes: 0 %
Lymphocytes Relative: 9 %
Lymphs Abs: 1.9 K/uL (ref 0.7–4.0)
MCH: 30.6 pg (ref 26.0–34.0)
MCHC: 31.9 g/dL (ref 30.0–36.0)
MCV: 96 fL (ref 80.0–100.0)
Monocytes Absolute: 0.7 K/uL (ref 0.1–1.0)
Monocytes Relative: 4 %
Neutro Abs: 17.1 K/uL — ABNORMAL HIGH (ref 1.7–7.7)
Neutrophils Relative %: 87 %
Platelets: 216 K/uL (ref 150–400)
RBC: 4.22 MIL/uL (ref 4.22–5.81)
RDW: 13.7 % (ref 11.5–15.5)
WBC: 19.8 K/uL — ABNORMAL HIGH (ref 4.0–10.5)
nRBC: 0 % (ref 0.0–0.2)

## 2024-06-30 LAB — BRAIN NATRIURETIC PEPTIDE: B Natriuretic Peptide: 152 pg/mL — ABNORMAL HIGH (ref 0.0–100.0)

## 2024-06-30 LAB — COMPREHENSIVE METABOLIC PANEL WITH GFR
ALT: 12 U/L (ref 0–44)
AST: 14 U/L — ABNORMAL LOW (ref 15–41)
Albumin: 3.7 g/dL (ref 3.5–5.0)
Alkaline Phosphatase: 54 U/L (ref 38–126)
Anion gap: 11 (ref 5–15)
BUN: 19 mg/dL (ref 8–23)
CO2: 27 mmol/L (ref 22–32)
Calcium: 9.3 mg/dL (ref 8.9–10.3)
Chloride: 102 mmol/L (ref 98–111)
Creatinine, Ser: 1.09 mg/dL (ref 0.61–1.24)
GFR, Estimated: >60 mL/min (ref >60)
Glucose, Bld: 124 mg/dL — ABNORMAL HIGH (ref 70–99)
Potassium: 4.5 mmol/L (ref 3.5–5.1)
Sodium: 140 mmol/L (ref 135–145)
Total Bilirubin: 1.1 mg/dL (ref 0.0–1.2)
Total Protein: 7 g/dL (ref 6.5–8.1)

## 2024-06-30 LAB — TROPONIN I (HIGH SENSITIVITY)
Troponin I (High Sensitivity): 12 ng/L (ref <18)
Troponin I (High Sensitivity): 13 ng/L

## 2024-06-30 SURGERY — EGD (ESOPHAGOGASTRODUODENOSCOPY)
Anesthesia: General

## 2024-06-30 MED ORDER — SUCCINYLCHOLINE CHLORIDE 200 MG/10ML IV SOSY
PREFILLED_SYRINGE | INTRAVENOUS | Status: AC
Start: 2024-06-30 — End: 2024-06-30
  Filled 2024-06-30: qty 10

## 2024-06-30 MED ORDER — FENTANYL CITRATE (PF) 100 MCG/2ML IJ SOLN
INTRAMUSCULAR | Status: AC
  Filled 2024-06-30: qty 2

## 2024-06-30 MED ORDER — NITROGLYCERIN 0.4 MG SL SUBL: 0.4000 mg | SUBLINGUAL_TABLET | SUBLINGUAL | Status: DC | PRN

## 2024-06-30 MED ORDER — ONDANSETRON HCL 4 MG/2ML IJ SOLN: 4.0000 mg | Freq: Four times a day (QID) | INTRAMUSCULAR | Status: DC | PRN

## 2024-06-30 MED ORDER — SODIUM CHLORIDE 0.9 % IV SOLN: 1.0000 g | Freq: Once | INTRAVENOUS | Status: DC

## 2024-06-30 MED ORDER — MORPHINE SULFATE (PF) 4 MG/ML IV SOLN
2.0000 mg | Freq: Once | INTRAVENOUS | Status: AC
  Administered 2024-06-30: 2 mg via INTRAVENOUS
  Filled 2024-06-30: qty 1

## 2024-06-30 MED ORDER — ISOSORBIDE MONONITRATE ER 60 MG PO TB24
120.0000 mg | ORAL_TABLET | Freq: Every morning | ORAL | Status: DC
  Administered 2024-07-01: 120 mg via ORAL
  Filled 2024-06-30: qty 2

## 2024-06-30 MED ORDER — OXYCODONE HCL 5 MG PO TABS: 10.0000 mg | ORAL_TABLET | ORAL | Status: DC | PRN

## 2024-06-30 MED ORDER — FENTANYL CITRATE (PF) 100 MCG/2ML IJ SOLN
INTRAMUSCULAR | Status: DC | PRN
  Administered 2024-06-30 (×2): 50 ug via INTRAVENOUS

## 2024-06-30 MED ORDER — CHLORHEXIDINE GLUCONATE CLOTH 2 % EX PADS
6.0000 | MEDICATED_PAD | Freq: Every day | CUTANEOUS | Status: DC
Start: 2024-06-30 — End: 2024-07-01
  Administered 2024-06-30 – 2024-07-01 (×2): 6 via TOPICAL

## 2024-06-30 MED ORDER — OXYCODONE HCL 5 MG PO TABS
15.0000 mg | ORAL_TABLET | ORAL | Status: DC | PRN
  Administered 2024-06-30 – 2024-07-01 (×4): 15 mg via ORAL
  Filled 2024-06-30 (×4): qty 3

## 2024-06-30 MED ORDER — ONDANSETRON HCL 4 MG/2ML IJ SOLN
INTRAMUSCULAR | Status: AC
  Filled 2024-06-30: qty 2

## 2024-06-30 MED ORDER — ONDANSETRON HCL 4 MG/2ML IJ SOLN
INTRAMUSCULAR | Status: DC | PRN
  Administered 2024-06-30: 4 mg via INTRAVENOUS

## 2024-06-30 MED ORDER — PROPOFOL 10 MG/ML IV BOLUS
INTRAVENOUS | Status: AC
  Filled 2024-06-30: qty 20

## 2024-06-30 MED ORDER — RISPERIDONE 0.25 MG PO TABS
0.2500 mg | ORAL_TABLET | Freq: Every day | ORAL | Status: DC
  Administered 2024-06-30: 0.25 mg via ORAL
  Filled 2024-06-30 (×2): qty 1

## 2024-06-30 MED ORDER — LACTATED RINGERS IV SOLN: INTRAVENOUS | Status: DC | PRN

## 2024-06-30 MED ORDER — ALBUTEROL SULFATE (2.5 MG/3ML) 0.083% IN NEBU: 2.5000 mg | INHALATION_SOLUTION | RESPIRATORY_TRACT | Status: DC | PRN

## 2024-06-30 MED ORDER — DIPHENHYDRAMINE HCL 50 MG/ML IJ SOLN
25.0000 mg | Freq: Once | INTRAMUSCULAR | Status: AC
  Administered 2024-06-30: 25 mg via INTRAVENOUS
  Filled 2024-06-30: qty 1

## 2024-06-30 MED ORDER — SODIUM CHLORIDE 0.9 % IV SOLN
3.0000 g | Freq: Four times a day (QID) | INTRAVENOUS | Status: DC
  Administered 2024-06-30 – 2024-07-01 (×3): 3 g via INTRAVENOUS
  Filled 2024-06-30 (×9): qty 8

## 2024-06-30 MED ORDER — RANOLAZINE ER 500 MG PO TB12
500.0000 mg | ORAL_TABLET | Freq: Two times a day (BID) | ORAL | Status: DC
  Administered 2024-06-30 – 2024-07-01 (×2): 500 mg via ORAL
  Filled 2024-06-30 (×2): qty 1

## 2024-06-30 MED ORDER — QUETIAPINE FUMARATE 25 MG PO TABS
25.0000 mg | ORAL_TABLET | Freq: Every evening | ORAL | Status: DC | PRN
  Administered 2024-06-30: 25 mg via ORAL
  Filled 2024-06-30: qty 1

## 2024-06-30 MED ORDER — BUDESON-GLYCOPYRROL-FORMOTEROL 160-9-4.8 MCG/ACT IN AERO
2.0000 | INHALATION_SPRAY | Freq: Two times a day (BID) | RESPIRATORY_TRACT | Status: DC
  Administered 2024-06-30 – 2024-07-01 (×2): 2 via RESPIRATORY_TRACT
  Filled 2024-06-30: qty 5.9

## 2024-06-30 MED ORDER — ONDANSETRON HCL 4 MG PO TABS: 4.0000 mg | ORAL_TABLET | Freq: Four times a day (QID) | ORAL | Status: DC | PRN

## 2024-06-30 MED ORDER — LEVOTHYROXINE SODIUM 75 MCG PO TABS
75.0000 ug | ORAL_TABLET | Freq: Every day | ORAL | Status: DC
  Administered 2024-07-01: 75 ug via ORAL
  Filled 2024-06-30: qty 1

## 2024-06-30 MED ORDER — SUCCINYLCHOLINE CHLORIDE 20 MG/ML IJ SOLN
INTRAMUSCULAR | Status: DC | PRN
Start: 2024-06-30 — End: 2024-07-01
  Administered 2024-06-30: 100 mg via INTRAVENOUS

## 2024-06-30 MED ORDER — IOHEXOL 300 MG/ML  SOLN
75.0000 mL | Freq: Once | INTRAMUSCULAR | Status: AC | PRN
  Administered 2024-06-30: 75 mL via INTRAVENOUS

## 2024-06-30 MED ORDER — DULOXETINE HCL 30 MG PO CPEP
30.0000 mg | ORAL_CAPSULE | Freq: Every day | ORAL | Status: DC
  Administered 2024-07-01: 30 mg via ORAL
  Filled 2024-06-30: qty 1

## 2024-06-30 MED ORDER — SODIUM CHLORIDE 0.9 % IV SOLN: 500.0000 mg | INTRAVENOUS | Status: DC

## 2024-06-30 MED ORDER — SUGAMMADEX SODIUM 200 MG/2ML IV SOLN
INTRAVENOUS | Status: DC | PRN
  Administered 2024-06-30: 200 mg via INTRAVENOUS

## 2024-06-30 MED ORDER — ROCURONIUM BROMIDE 100 MG/10ML IV SOLN
INTRAVENOUS | Status: DC | PRN
Start: 2024-06-30 — End: 2024-07-01
  Administered 2024-06-30: 40 mg via INTRAVENOUS

## 2024-06-30 MED ORDER — METOCLOPRAMIDE HCL 5 MG/ML IJ SOLN
10.0000 mg | Freq: Once | INTRAMUSCULAR | Status: AC
  Administered 2024-06-30: 10 mg via INTRAVENOUS
  Filled 2024-06-30: qty 2

## 2024-06-30 MED ORDER — PROPOFOL 10 MG/ML IV BOLUS
INTRAVENOUS | Status: DC | PRN
  Administered 2024-06-30: 80 mg via INTRAVENOUS

## 2024-06-30 MED ORDER — MEMANTINE HCL 10 MG PO TABS
10.0000 mg | ORAL_TABLET | Freq: Two times a day (BID) | ORAL | Status: DC
  Administered 2024-07-01: 10 mg via ORAL
  Filled 2024-06-30: qty 1

## 2024-06-30 MED ORDER — LACTATED RINGERS IV BOLUS
1000.0000 mL | Freq: Once | INTRAVENOUS | Status: AC
  Administered 2024-06-30: 1000 mL via INTRAVENOUS

## 2024-06-30 NOTE — ED Notes (Addendum)
 MD and RN made aware, Pt states he has a migraine from not eating today, says I just need an oxycodone  and it will knock it right out. I take them daily for pain management for neuropathy in my legs.

## 2024-06-30 NOTE — Anesthesia Preprocedure Evaluation (Addendum)
 Anesthesia Evaluation  Patient identified by MRN, date of birth, ID band Patient awake    Reviewed: Allergy & Precautions, H&P , NPO status , Patient's Chart, lab work & pertinent test results  Airway Mallampati: II  TM Distance: >3 FB Neck ROM: Full    Dental  (+) Edentulous Lower, Edentulous Upper   Pulmonary sleep apnea , pneumonia, unresolved, COPD,  oxygen  dependent, former smoker   Pulmonary exam normal breath sounds clear to auscultation       Cardiovascular Exercise Tolerance: Poor hypertension, + CAD, + Past MI and +CHF  Normal cardiovascular exam+ dysrhythmias  Rhythm:Regular Rate:Normal     Neuro/Psych  PSYCHIATRIC DISORDERS Anxiety Depression   Dementia negative neurological ROS     GI/Hepatic negative GI ROS, Neg liver ROS,,,  Endo/Other  Hypothyroidism    Renal/GU negative Renal ROS  negative genitourinary   Musculoskeletal  (+) Arthritis ,    Abdominal   Peds negative pediatric ROS (+)  Hematology negative hematology ROS (+)   Anesthesia Other Findings   Reproductive/Obstetrics negative OB ROS                              Anesthesia Physical Anesthesia Plan  ASA: 4 and emergent  Anesthesia Plan: General   Post-op Pain Management:    Induction: Cricoid pressure planned, Rapid sequence and Intravenous  PONV Risk Score and Plan:   Airway Management Planned: Oral ETT  Additional Equipment:   Intra-op Plan:   Post-operative Plan: Possible Post-op intubation/ventilation  Informed Consent: I have reviewed the patients History and Physical, chart, labs and discussed the procedure including the risks, benefits and alternatives for the proposed anesthesia with the patient or authorized representative who has indicated his/her understanding and acceptance.       Plan Discussed with:   Anesthesia Plan Comments:         Anesthesia Quick Evaluation

## 2024-06-30 NOTE — Consult Note (Signed)
 @LOGO @   Primary Care Physician:  Bertell Satterfield, MD Primary Gastroenterologist:  Dr. Shaaron  Pre-Procedure History & Physical: HPI:  Dustin [name] is a 89 y.o. male presented to the ED with report of not being able to swallow anything since getting some dried beef caught in his throat yesterday.  Cannot even handle his secretions.  Seen by EDP a question of some aspiration.  Reportedly on 2 L O2 at home.  Patchy infiltrate right middle right lower lobe; chest CT and labs are pending.  He is oxygenating very well in the ED with 2 L nasal prongs.  Distant history of food impaction back in 2014 he was disimpacted here.  Subsequent endoscopy demonstrated normal-appearing tubular esophagus. Multiple comorbidities.  Does not take a PPI. Emergency EGD back in 2014 for food impaction.  Tubular esophagus appeared normal.  Esophageal biopsies were not taken at that time.  He remains on aspirin  and Plavix .  He is not anticoagulated. Past Medical History:  Diagnosis Date   Alzheimer disease (HCC)    Anxiety    Arthritis    Atrophic gastritis    a. By EGD 02/2013.   Carotid artery disease (HCC)    a. mild-mod plaque, <50% stenosis bilat by duplex 2018.   Coronary atherosclerosis of native coronary artery    a. Multivessel s/p CABG 1996. b. DESx2 to SVG-PDA in 2012 c. DES to distal LM in 11/2019 d. cath in 02/2021 showing patent LM stent, patent LIMA-LAD, patent SVG-OM1 with chronically occluded jump limb to OM2 and chronic occlusion of SVG-D2 and SVG-PDA and no targets for intervention   DDD (degenerative disc disease)    Chronic back pain   Dementia (HCC)    Enlarged prostate    Essential hypertension    Hematuria    History of radiation therapy    Left Lung- 04/26/23-Dr. Lynwood Nasuti   Hypothyroidism    LBBB (left bundle branch block)    MI (myocardial infarction) (HCC)    Mixed hyperlipidemia    OA (osteoarthritis)    OSA (obstructive sleep apnea)    Pneumonia due to COVID-19 virus     February 2021   Sinus bradycardia    a. Aricept  and BB discontinued due to this.    Past Surgical History:  Procedure Laterality Date   CATARACT EXTRACTION W/PHACO Left 03/22/2022   Procedure: CATARACT EXTRACTION PHACO AND INTRAOCULAR LENS PLACEMENT (IOC);  Surgeon: Harrie Lynwood, MD;  Location: [facility];  Service: Ophthalmology;  Laterality: Left;  CDE: 25.22   COLONOSCOPY  08/03/2004   Jenkins-numerous large diverticula in the descending, transverse, descending, and sigmoid colon. Otherwise normal exam.   COLONOSCOPY  07/12/2012   RMR: External hemorrhoidal tag; multiple rectal and colonic polyps removed and/or treated as described above. Pancolonic diverticulosis. Bx-tubular adenomas, rectal hyperplastic polyp. next colonoscopy in 06/2015.   COLONOSCOPY N/A 06/22/2016   Procedure: COLONOSCOPY;  Surgeon: Oneil Budge, MD;  Location: [facility];  Service: Gastroenterology;  Laterality: N/A;  730   CORONARY ANGIOPLASTY WITH STENT PLACEMENT     CORONARY ARTERY BYPASS GRAFT  1996   LIMA to LAD, SVG to D2, SVG to PDA, SVG to OM1 and OM2   CORONARY STENT INTERVENTION N/A 12/10/2019   Procedure: CORONARY STENT INTERVENTION;  Surgeon: Verlin Lonni BIRCH, MD;  Location: [facility];  Service: Cardiovascular;  Laterality: N/A;   CORONARY ULTRASOUND/IVUS N/A 12/10/2019   Procedure: Intravascular Ultrasound/IVUS;  Surgeon: Verlin Lonni BIRCH, MD;  Location: [facility];  Service:  Cardiovascular;  Laterality: N/A;   ESOPHAGOGASTRODUODENOSCOPY N/A 03/16/2013   Procedure: ESOPHAGOGASTRODUODENOSCOPY (EGD);  Surgeon: Lamar CHRISTELLA Hollingshead, MD;  Location: [facility];  Service: Endoscopy;  Laterality: N/A;  12:00-moved to 1030 Leigh Ann notified pt   ESOPHAGOGASTRODUODENOSCOPY N/A 03/06/2013   Procedure: ESOPHAGOGASTRODUODENOSCOPY (EGD);  Surgeon: Lamar CHRISTELLA Hollingshead, MD;  Location: [facility];  Service: Endoscopy;  Laterality: N/A;   HERNIA REPAIR     LEFT HEART CATH AND CORS/GRAFTS  ANGIOGRAPHY N/A 12/10/2019   Procedure: LEFT HEART CATH AND CORS/GRAFTS ANGIOGRAPHY;  Surgeon: Verlin Lonni BIRCH, MD;  Location: [facility];  Service: Cardiovascular;  Laterality: N/A;   LEFT HEART CATH AND CORS/GRAFTS ANGIOGRAPHY N/A 03/02/2021   Procedure: LEFT HEART CATH AND CORS/GRAFTS ANGIOGRAPHY;  Surgeon: Mady Lonni, MD;  Location: [facility];  Service: Cardiovascular;  Laterality: N/A;    Prior to Admission medications   Medication Sig Start Date End Date Taking? Authorizing Provider  albuterol  (PROVENTIL ) (2.5 MG/3ML) 0.083% nebulizer solution Take 3 mLs (2.5 mg total) by nebulization every 2 (two) hours as needed for wheezing or shortness of breath. 11/24/22   Pearlean Manus, MD  albuterol  (VENTOLIN  HFA) 108 (90 Base) MCG/ACT inhaler Inhale 2 puffs into the lungs every 6 (six) hours as needed for wheezing or shortness of breath. 11/24/22   Pearlean Manus, MD  aspirin  EC 81 MG tablet Take 1 tablet (81 mg total) by mouth daily. Swallow whole. Patient not taking: Reported on 05/30/2024 02/24/24   Laurence Locus, DO  Budeson-Glycopyrrol-Formoterol  (BREZTRI  AEROSPHERE) 160-9-4.8 MCG/ACT AERO Inhale 2 puffs into the lungs in the morning and at bedtime. 03/16/23   Sood, Vineet, MD  clopidogrel  (PLAVIX ) 75 MG tablet Take 75 mg by mouth daily. 03/14/24   [provider]  DULoxetine  (CYMBALTA ) 30 MG capsule Take 30 mg by mouth daily. 05/10/23   [provider]  escitalopram  (LEXAPRO ) 20 MG tablet Take 20 mg by mouth every morning.     [provider]  furosemide  (LASIX ) 20 MG tablet Take 1 tablet (20 mg total) by mouth 2 (two) times daily. For fluid 11/24/22   Pearlean Manus, MD  gabapentin  (NEURONTIN ) 100 MG capsule Take 100 mg by mouth 3 (three) times daily. 03/04/23   [provider]  isosorbide  mononitrate (IMDUR ) 120 MG 24 hr tablet Take 1 tablet (120 mg total) by mouth every morning. 11/24/22   Pearlean Manus, MD  levothyroxine   (SYNTHROID ) 75 MCG tablet Take 75 mcg by mouth daily. 02/23/22   [provider]  memantine  (NAMENDA ) 10 MG tablet Take 10 mg by mouth 2 (two) times daily.  03/05/16   [provider]  nitroGLYCERIN  (NITROSTAT ) 0.4 MG SL tablet Place 1 tablet (0.4 mg total) under the tongue every 5 (five) minutes as needed for chest pain. 11/24/22   Pearlean Manus, MD  QUEtiapine  (SEROQUEL ) 25 MG tablet Take 25 mg by mouth at bedtime as needed. 03/14/24   [provider]  ranolazine  (RANEXA ) 500 MG 12 hr tablet Take 500 mg by mouth 2 (two) times daily. 07/06/23   [provider]  risperiDONE  (RISPERDAL ) 0.25 MG tablet Take 0.25 mg by mouth at bedtime. 03/18/23   [provider]  rosuvastatin  (CRESTOR ) 40 MG tablet Take 1 tablet (40 mg total) by mouth daily at 6 PM. 02/24/24 03/25/24  Laurence Locus, DO    Allergies as of 06/30/2024   (No Known Allergies)    Family History  Problem Relation Age of Onset   Heart disease Other  Heart attack Mother    Colon cancer Neg Hx     Social History   Socioeconomic History   Marital status: Married    Spouse name: Not on file   Number of children: 3   Years of education: Not on file   Highest education level: Not on file  Occupational History   Occupation: retired    Associate Professor: RETIRED  Tobacco Use   Smoking status: Former    Current packs/day: 0.00    Average packs/day: 2.0 packs/day for 40.0 years (80.0 ttl pk-yrs)    Types: Cigarettes    Start date: 12/27/1954    Quit date: 12/27/1994    Years since quitting: 29.5   Smokeless tobacco: Never  Vaping Use   Vaping status: Never Used  Substance and Sexual Activity   Alcohol use: No    Alcohol/week: 0.0 standard drinks of alcohol   Drug use: No   Sexual activity: Not Currently  Other Topics Concern   Not on file  Social History Narrative   Lives w/ ailing wife.   Son brings meals 3x per week   Social Drivers of Health   Financial Resource Strain: Not on file   Food Insecurity: No Food Insecurity (02/21/2024)   Hunger Vital Sign    Worried About Running Out of Food in the Last Year: Never true    Ran Out of Food in the Last Year: Never true  Transportation Needs: No Transportation Needs (02/21/2024)   PRAPARE - Administrator, Civil Service (Medical): No    Lack of Transportation (Non-Medical): No  Physical Activity: Not on file  Stress: Not on file  Social Connections: Moderately Isolated (02/21/2024)   Social Connection and Isolation Panel    Frequency of Communication with Friends and Family: More than three times a week    Frequency of Social Gatherings with Friends and Family: Once a week    Attends Religious Services: Never    Database administrator or Organizations: No    Attends Banker Meetings: Never    Marital Status: Married  Catering manager Violence: Not At Risk (02/21/2024)   Humiliation, Afraid, Rape, and Kick questionnaire    Fear of Current or Ex-Partner: No    Emotionally Abused: No    Physically Abused: No    Sexually Abused: No    Review of Systems: See HPI, otherwise negative ROS  Physical Exam: BP (!) 146/63   Pulse 64   Temp 97.7 F (36.5 C) (Oral)   Resp 17   Ht 5' 10 (1.778 m)   Wt 93 kg   SpO2 97%   BMI 29.41 kg/m  General:   Alert,  Well-developed, well-nourished, pleasant and cooperative in NAD Lungs:  Clear throughout to auscultation.   No wheezes, crackles, or rhonchi. No acute distress. Heart:  Regular rate and rhythm; no murmurs, clicks, rubs,  or gallops. Abdomen: Non-distended, normal bowel sounds.  Soft and nontender without appreciable mass or hepatosplenomegaly.   Impression/Plan: 89 year old gentleman with Alzheimer's, and multiple other comorbidities presents to the ED with a complaint of getting beef stuck in his esophagus yesterday.  Has not been able to swallow even his saliva since that time.  History of same previously.  Possible aspiration pneumonia.  Unless  dissuaded by imaging or labs, plan towards moving towards emergent EGD today for disimpaction. Pulmonary on chest CT reveals fluid/debris in the esophagus. Discussed with anesthesia and EDP.  Will plan to intubate patient for disimpaction.  At  this point, the differential includes occult stricture, EOE, esophageal motility disorder Patient understands in this acute setting will likely not be able to dilate his esophagus.  He would need to come back in an elective setting as appropriate to have this done. The risks, benefits, limitations, alternatives and imponderables have been reviewed with the patient. Potential for esophageal dilation, biopsy, etc. have also been reviewed.  Questions have been answered. All parties agreeable.     Notice: This dictation was prepared with Dragon dictation along with smaller phrase technology. Any transcriptional errors that result from this process are unintentional and may not be corrected upon review.

## 2024-06-30 NOTE — H&P (Signed)
 [facility] H&P   Patient Demographics:    Dustin [name], is a 89 y.o. male  MRN: [id number]   DOB - [birth year]  Admit Date - 06/30/2024  Outpatient Primary MD for the patient is Bertell Satterfield, MD  Referring MD/NP/PA: Dr Earnie  Patient coming from: Home  Chief Complaint  Patient presents with   Dysphagia      HPI:    Dustin [name]  is a 89 y.o. male,  with medical history significant for past medical history   of Alzheimer's disease, carotid artery disease, coronary artery disease (Multivessel s/p CABG 1996. b. DESx2 to SVG-PDA in 2012 c. DES to distal LM in 11/2019 d. cath in 02/2021 showing patent LM stent, patent LIMA-LAD, patent SVG-OM1 with chronically occluded jump limb to OM2 and chronic occlusion of SVG-D2 and SVG-PDA and no targets for intervention), hypothyroidism, hypertension, OSA, hypothyroidism, HLD, depression, history of left lung radiation therapy, with chronic hypoxic respiratory failure but typically uses only 2 L of oxygen  at night . - Patient was brought to ED for dysphagia, wife report that he chokes on everything he eats over the last few months, worse was yesterday after he ate his steak, everything he tries to get down come back up since then, over the last 24 hours he could not keep down any liquids or solids, he is having difficulty breathing which prompted him to come to ED, as well he does have chest pressure pain intermittently, he took sublingual nitro several times without much help, he reports he had a piece of steak removed by endoscopy few years ago. - in ED his workup significant for leukocytosis at 19.8, CT chest significant for distended esophagus with impacted food bolus, and irregular 8 space disease within the left posterior upper lobe and superior left lower lobe, and multifocal nodularity suspicious for aspiration, patient went for endoscopy, required  intubation, he was extubated in PACU but he remained with tenuous respiratory status, so [name] hospitalist was consulted to admit to ICU for further management    Review of systems:     A full 10 point Review of Systems was done, except as stated above, all other Review of Systems were negative.   With Past History of the following :    Past Medical History:  Diagnosis Date   Alzheimer disease (HCC)    Anxiety    Arthritis    Atrophic gastritis    a. By EGD 02/2013.   Carotid artery disease (HCC)    a. mild-mod plaque, <50% stenosis bilat by duplex 2018.   Coronary atherosclerosis of native coronary artery    a. Multivessel s/p CABG 1996. b. DESx2 to SVG-PDA in 2012 c. DES to distal LM in 11/2019 d. cath in 02/2021 showing patent LM stent, patent LIMA-LAD, patent SVG-OM1 with chronically occluded jump limb to OM2 and chronic occlusion of SVG-D2 and SVG-PDA and no targets for intervention  DDD (degenerative disc disease)    Chronic back pain   Dementia (HCC)    Enlarged prostate    Essential hypertension    Hematuria    History of radiation therapy    Left Lung- 04/26/23-Dr. Lynwood Nasuti   Hypothyroidism    LBBB (left bundle branch block)    MI (myocardial infarction) (HCC)    Mixed hyperlipidemia    OA (osteoarthritis)    OSA (obstructive sleep apnea)    Pneumonia due to COVID-19 virus    February 2021   Sinus bradycardia    a. Aricept  and BB discontinued due to this.      Past Surgical History:  Procedure Laterality Date   CATARACT EXTRACTION W/PHACO Left 03/22/2022   Procedure: CATARACT EXTRACTION PHACO AND INTRAOCULAR LENS PLACEMENT (IOC);  Surgeon: Harrie Lynwood, MD;  Location: [facility];  Service: Ophthalmology;  Laterality: Left;  CDE: 25.22   COLONOSCOPY  08/03/2004   Jenkins-numerous large diverticula in the descending, transverse, descending, and sigmoid colon. Otherwise normal exam.   COLONOSCOPY  07/12/2012   RMR: External hemorrhoidal tag; multiple rectal and  colonic polyps removed and/or treated as described above. Pancolonic diverticulosis. Bx-tubular adenomas, rectal hyperplastic polyp. next colonoscopy in 06/2015.   COLONOSCOPY N/A 06/22/2016   Procedure: COLONOSCOPY;  Surgeon: Oneil Budge, MD;  Location: [facility];  Service: Gastroenterology;  Laterality: N/A;  730   CORONARY ANGIOPLASTY WITH STENT PLACEMENT     CORONARY ARTERY BYPASS GRAFT  1996   LIMA to LAD, SVG to D2, SVG to PDA, SVG to OM1 and OM2   CORONARY STENT INTERVENTION N/A 12/10/2019   Procedure: CORONARY STENT INTERVENTION;  Surgeon: Verlin Lonni BIRCH, MD;  Location: [facility];  Service: Cardiovascular;  Laterality: N/A;   CORONARY ULTRASOUND/IVUS N/A 12/10/2019   Procedure: Intravascular Ultrasound/IVUS;  Surgeon: Verlin Lonni BIRCH, MD;  Location: [facility];  Service: Cardiovascular;  Laterality: N/A;   ESOPHAGOGASTRODUODENOSCOPY N/A 03/16/2013   Procedure: ESOPHAGOGASTRODUODENOSCOPY (EGD);  Surgeon: Lamar CHRISTELLA Hollingshead, MD;  Location: [facility];  Service: Endoscopy;  Laterality: N/A;  12:00-moved to 1030 Leigh Ann notified pt   ESOPHAGOGASTRODUODENOSCOPY N/A 03/06/2013   Procedure: ESOPHAGOGASTRODUODENOSCOPY (EGD);  Surgeon: Lamar CHRISTELLA Hollingshead, MD;  Location: [facility];  Service: Endoscopy;  Laterality: N/A;   HERNIA REPAIR     LEFT HEART CATH AND CORS/GRAFTS ANGIOGRAPHY N/A 12/10/2019   Procedure: LEFT HEART CATH AND CORS/GRAFTS ANGIOGRAPHY;  Surgeon: Verlin Lonni BIRCH, MD;  Location: [facility];  Service: Cardiovascular;  Laterality: N/A;   LEFT HEART CATH AND CORS/GRAFTS ANGIOGRAPHY N/A 03/02/2021   Procedure: LEFT HEART CATH AND CORS/GRAFTS ANGIOGRAPHY;  Surgeon: Mady Lonni, MD;  Location: [facility];  Service: Cardiovascular;  Laterality: N/A;      Social History:     Social History   Tobacco Use   Smoking status: Former    Current packs/day: 0.00    Average packs/day: 2.0 packs/day for 40.0 years (80.0 ttl  pk-yrs)    Types: Cigarettes    Start date: 12/27/1954    Quit date: 12/27/1994    Years since quitting: 29.5   Smokeless tobacco: Never  Substance Use Topics   Alcohol use: No    Alcohol/week: 0.0 standard drinks of alcohol        Family History :     Family History  Problem Relation Age of Onset   Heart disease Other    Heart attack Mother    Colon cancer Neg Hx  Home Medications:   Prior to Admission medications   Medication Sig Start Date End Date Taking? Authorizing Provider  albuterol  (PROVENTIL ) (2.5 MG/3ML) 0.083% nebulizer solution Take 3 mLs (2.5 mg total) by nebulization every 2 (two) hours as needed for wheezing or shortness of breath. 11/24/22   Pearlean Manus, MD  albuterol  (VENTOLIN  HFA) 108 (90 Base) MCG/ACT inhaler Inhale 2 puffs into the lungs every 6 (six) hours as needed for wheezing or shortness of breath. 11/24/22   Pearlean Manus, MD  aspirin  EC 81 MG tablet Take 1 tablet (81 mg total) by mouth daily. Swallow whole. Patient not taking: Reported on 05/30/2024 02/24/24   Laurence Locus, DO  Budeson-Glycopyrrol-Formoterol  (BREZTRI  AEROSPHERE) 160-9-4.8 MCG/ACT AERO Inhale 2 puffs into the lungs in the morning and at bedtime. 03/16/23   Sood, Vineet, MD  clopidogrel  (PLAVIX ) 75 MG tablet Take 75 mg by mouth daily. 03/14/24   [provider]  DULoxetine  (CYMBALTA ) 30 MG capsule Take 30 mg by mouth daily. 05/10/23   [provider]  escitalopram  (LEXAPRO ) 20 MG tablet Take 20 mg by mouth every morning.     [provider]  furosemide  (LASIX ) 20 MG tablet Take 1 tablet (20 mg total) by mouth 2 (two) times daily. For fluid 11/24/22   Pearlean Manus, MD  gabapentin  (NEURONTIN ) 100 MG capsule Take 100 mg by mouth 3 (three) times daily. 03/04/23   [provider]  isosorbide  mononitrate (IMDUR ) 120 MG 24 hr tablet Take 1 tablet (120 mg total) by mouth every morning. 11/24/22   Pearlean Manus, MD  levothyroxine  (SYNTHROID ) 75 MCG  tablet Take 75 mcg by mouth daily. 02/23/22   [provider]  memantine  (NAMENDA ) 10 MG tablet Take 10 mg by mouth 2 (two) times daily.  03/05/16   [provider]  nitroGLYCERIN  (NITROSTAT ) 0.4 MG SL tablet Place 1 tablet (0.4 mg total) under the tongue every 5 (five) minutes as needed for chest pain. 11/24/22   Pearlean Manus, MD  QUEtiapine  (SEROQUEL ) 25 MG tablet Take 25 mg by mouth at bedtime as needed. 03/14/24   [provider]  ranolazine  (RANEXA ) 500 MG 12 hr tablet Take 500 mg by mouth 2 (two) times daily. 07/06/23   [provider]  risperiDONE  (RISPERDAL ) 0.25 MG tablet Take 0.25 mg by mouth at bedtime. 03/18/23   [provider]  rosuvastatin  (CRESTOR ) 40 MG tablet Take 1 tablet (40 mg total) by mouth daily at 6 PM. 02/24/24 03/25/24  Laurence Locus, DO     Allergies:    No Known Allergies   Physical Exam:   Vitals  Blood pressure (!) 146/64, pulse 97, temperature 98.5 F (36.9 C), temperature source Axillary, resp. rate (!) 30, height 5' 10 (1.778 m), weight 92.9 kg, SpO2 98%.   1. General Pale, deconditioned, chronically ill-appearing  2.  Worsening postoperatively since multiple times, but most recently he is awake, alert, oriented x 3 and appropriate despite his history of dementia   3. No F.N deficits, ALL C.Nerves Intact, Strength 5/5 all 4 extremities, Sensation intact all 4 extremities, Plantars down going.  4. Ears and Eyes appear Normal, Conjunctivae clear, PERRLA. Moist Oral Mucosa.  5. Supple Neck, No JVD, No cervical lymphadenopathy appriciated, No Carotid Bruits.  6. Symmetrical Chest wall movement, mildly tachypneic with right lung Rales  7. RRR, No Gallops, Rubs or Murmurs, No Parasternal Heave.  8. Positive Bowel Sounds, Abdomen Soft, No tenderness, No organomegaly appriciated,No rebound -guarding or rigidity.  9.  No Cyanosis, Normal  Skin Turgor, No Skin Rash or Bruise.  10. Good muscle tone,  joints appear  normal , no effusions, Normal ROM.   Data Review:    CBC Recent Labs  Lab 06/30/24 1720  WBC 19.8*  HGB 12.9*  HCT 40.5  PLT 216  MCV 96.0  MCH 30.6  MCHC 31.9  RDW 13.7  LYMPHSABS 1.9  MONOABS 0.7  EOSABS 0.1  BASOSABS 0.0   ------------------------------------------------------------------------------------------------------------------  Chemistries  Recent Labs  Lab 06/30/24 1720  NA 140  K 4.5  CL 102  CO2 27  GLUCOSE 124*  BUN 19  CREATININE 1.09  CALCIUM  9.3  AST 14*  ALT 12  ALKPHOS 54  BILITOT 1.1   ------------------------------------------------------------------------------------------------------------------ estimated creatinine clearance is 54.7 mL/min (by C-G formula based on SCr of 1.09 mg/dL). ------------------------------------------------------------------------------------------------------------------ No results for input(s): TSH, T4TOTAL, T3FREE, THYROIDAB in the last 72 hours.  Invalid input(s): FREET3  Coagulation profile No results for input(s): INR, PROTIME in the last 168 hours. ------------------------------------------------------------------------------------------------------------------- No results for input(s): DDIMER in the last 72 hours. -------------------------------------------------------------------------------------------------------------------  Cardiac Enzymes No results for input(s): CKMB, TROPONINI, MYOGLOBIN in the last 168 hours.  Invalid input(s): CK ------------------------------------------------------------------------------------------------------------------    Component Value Date/Time   BNP 152.0 (H) 06/30/2024 1720     ---------------------------------------------------------------------------------------------------------------  Urinalysis    Component Value Date/Time   COLORURINE YELLOW 05/23/2024 1421   APPEARANCEUR CLOUDY (A) 05/23/2024 1421   APPEARANCEUR Clear  03/10/2022 1355   LABSPEC 1.012 05/23/2024 1421   PHURINE 6.0 05/23/2024 1421   GLUCOSEU NEGATIVE 05/23/2024 1421   HGBUR LARGE (A) 05/23/2024 1421   BILIRUBINUR NEGATIVE 05/23/2024 1421   BILIRUBINUR Negative 03/10/2022 1355   KETONESUR NEGATIVE 05/23/2024 1421   PROTEINUR 100 (A) 05/23/2024 1421   UROBILINOGEN 0.2 01/26/2015 2247   NITRITE NEGATIVE 05/23/2024 1421   LEUKOCYTESUR LARGE (A) 05/23/2024 1421    ----------------------------------------------------------------------------------------------------------------   Imaging Results:    CT Chest W Contrast Result Date: 06/30/2024 CLINICAL DATA:  Choking after eating EXAM: CT CHEST WITH CONTRAST TECHNIQUE: Multidetector CT imaging of the chest was performed during intravenous contrast administration. RADIATION DOSE REDUCTION: This exam was performed according to the departmental dose-optimization program which includes automated exposure control, adjustment of the mA and/or kV according to patient size and/or use of iterative reconstruction technique. CONTRAST:  75mL OMNIPAQUE  IOHEXOL  300 MG/ML  SOLN COMPARISON:  Chest x-ray 06/30/2024, chest CT 03/26/2024, 09/20/2023, PET CT 03/24/2023 FINDINGS: Cardiovascular: Moderate aortic atherosclerosis. No aneurysm. Coronary vascular calcifications. Post CABG changes. Cardiomegaly. No significant pericardial effusion Mediastinum/Nodes: Patent trachea. No thyroid  mass. No suspicious lymph nodes. Moderate fluid distension of the esophagus to the level of GE junction. Low-density filling defect at the GE junction measuring 4.2 x 2.4 cm on series 2, image 131. Lungs/Pleura: Emphysema. Irregular airspace disease and distortion within the left posterior upper lobe and the superior left lower lobe, slightly progressive compared with CT from 03/26/2024, definitely increased from September 2024. Multifocal peribronchovascular nodularity most evident in the right middle lobe and bilateral lower lobes. Upper  Abdomen: No acute finding Musculoskeletal: Degenerative changes of the spine. Sternotomy. Acute appearing left 6th posterior rib fracture IMPRESSION: 1. Moderate diffuse fluid distension of the esophagus to the level of GE junction. Low-density filling defect at the GE junction measuring 4.2 x 2.4 cm, suspect for impacted food bolus given clinical history with underlying mass or stricture not excluded. Endoscopy is recommended for further evaluation. 2. Irregular airspace disease and distortion within the left posterior upper lobe and superior left lower  lobe, slightly progressive compared with CT from 03/26/2024, definitely increased from September 2024. Findings could be due to evolving post radiation/therapy changes but continued close imaging follow-up is recommended 3. Multifocal peribronchovascular nodularity most evident in the right middle lobe and bilateral lower lobes, suspect for multifocal infection/inflammation or possible aspiration. 4. Acute appearing left sixth posterior rib fracture. 5. Aortic atherosclerosis. Aortic Atherosclerosis (ICD10-I70.0) and Emphysema (ICD10-J43.9). Electronically Signed   By: Luke Bun M.D.   On: 06/30/2024 19:02   DG Chest 2 View Result Date: 06/30/2024 CLINICAL DATA:  Shortness of breath EXAM: CHEST - 2 VIEW COMPARISON:  03/31/2024, CT chest 03/26/2024, radiograph 02/20/2024, 11/23/2022, PET CT 03/24/2023 FINDINGS: Post sternotomy changes. Cardiomegaly. Distortion and irregular density in the left mid to upper lung, unchanged with recent priors but increased compared with remote priors and probably corresponds to history of lung cancer and treatment changes. New patchy opacities in the right mid and lower lung. Aortic atherosclerosis. No pneumothorax. IMPRESSION: 1. New mild patchy opacities in the right mid and lower lung, suspicious for possible 2. Cardiomegaly without overt edema. 3. Distortion and irregular density in the left mid to upper lung, corresponding  to history of lung cancer and treatment changes, reference chest CT from March 2025 Electronically Signed   By: Luke Bun M.D.   On: 06/30/2024 16:24      Assessment & Plan:    Principal Problem:   Food impaction of esophagus Active Problems:   OSA (obstructive sleep apnea)   Alzheimer disease (HCC)   Coronary artery disease - not on betablocker due to baseline bradycardia. Documented by cardiology   Hyperlipidemia   Hypothyroidism   Dementia (HCC)   Chronic diastolic CHF (congestive heart failure) (HCC)   COPD (chronic obstructive pulmonary disease) (HCC)  Food impaction - Patient had food impaction, GI input greatly appreciated, he had beef getting stuck in his esophagus yesterday, CT chest revealed fluid/debris in the esophagus, went for emergent endoscopy, discussed with GI, it was noted large column of food debris in the esophagus. Pushed it down into the stomach. -Mentation for clear liquid diet, upright with meals, PPI twice daily, he will need repeat EGD for complete elective examination in 2 to 3 weeks.  Acute on chronic respiratory failure with hypoxia Aspiration pneumonia -Required intubation for airway protection, extubated, remains on significant oxygen  requirement, but this has weaned off, currently on humidified 5 L oxygen . - Underlying COPD, but no wheezing, continue with home nebs. - Likely aspiration pneumonia contributing to his hypoxia, I will change his antibiotics from Rocephin  and azithromycin  to IV Unasyn  to cover for aspiration. tal O2.     Coronary artery disease  Hypertension - Hold aspirin  given his recent endoscopy - Resume Imdur , Ranexa  and Crestor  tomorrow. - Not on beta-blockers due to baseline bradycardia   Alzheimer disease (HCC) - Chronic. On namenda  bid. - Continue with Seroquel  as needed at bedtime  Chronic pain syndrome - He is currently becoming more awake asking for his pain medicine, I will resume currently at 15 mg of oxycodone   every 4 hours as needed (Home meds reviewed on PDMP website, he is on 30 mg p.o. oxycodone  5 times daily) monitor for         DVT Prophylaxis SCDs   AM Labs Ordered, also please review Full Orders  Family Communication: Admission, patients condition and plan of care including tests being ordered have been discussed with the patient (able to reach wife by phone) who indicate understanding and agree with the  plan and Code Status.  Code Status Full  Likely DC to  home  Consults called: seen by GI    Admission status: observation    Time spent in minutes : 70 minutes   Brayton Lye M.D on 06/30/2024 at 9:49 PM   [name] - Office  [phone]

## 2024-06-30 NOTE — Transfer of Care (Signed)
 Immediate Anesthesia Transfer of Care Note  Patient: Dustin [name]  Procedure(s) Performed: EGD (ESOPHAGOGASTRODUODENOSCOPY)  Patient Location: PACU and ICU  Anesthesia Type:General  Level of Consciousness: awake and alert   Airway & Oxygen  Therapy: Patient Spontanous Breathing  Post-op Assessment: Report given to RN and Post -op Vital signs reviewed and stable  Post vital signs: Reviewed and stable  Last Vitals:  Vitals Value Taken Time  BP 146/64 06/30/24 19:45  Temp    Pulse 36 06/30/24 19:51  Resp 31 06/30/24 19:51  SpO2 81 % 06/30/24 19:51  Vitals shown include unfiled device data.  Last Pain:  Vitals:   06/30/24 1555  TempSrc: Oral  PainSc:          Complications: No notable events documented.

## 2024-06-30 NOTE — Progress Notes (Signed)
 Patient started on flutter and incentive. Fio2 decreased to 5 liters.

## 2024-06-30 NOTE — ED Triage Notes (Signed)
 Pt states he chokes on everything he eats over months, worse yesterday after eating steak.  Everything he tries to get down will come back up. RA sats at 88%

## 2024-06-30 NOTE — Progress Notes (Signed)
 Patient came from endo on NRB mask, Most upset just wanting to sit up, UP set that he is hot. Wanting to stand up. Placed on 15 liter saulter high flow nasal cannula. Saturation 100 percent.

## 2024-06-30 NOTE — ED Notes (Signed)
 Pt in xray

## 2024-06-30 NOTE — ED Provider Notes (Signed)
 [place] EMERGENCY DEPARTMENT AT [facility] Provider Note   CSN: [id number] Arrival date & time: 06/30/24  1517     History  Chief Complaint  Patient presents with   Dysphagia    Dustin [name] is a 89 y.o. male with PMH as listed below who presents with dysphagia. Wife reports that he chokes on everything he eats over the last few months, worse yesterday after eating steak. Everything he tries to get down will come back up starting yesterday. Over the last 24 hours cannot keep down liquids or solids.   Noted to have SpO2 88% on RA. Normally wears 2L oxygen  at night and sometimes during the day but not normally all the time. Now also having difficulty breathing, feeling smothered. Been coughing especially a lot at night.   Patient also endorses chest pain intermittently and has taken nitroglycerin  several times in the last few days. Hasn't taken it today. Also endorses nausea. Denies leg swelling.   Reports that he has been seen by a GI doctor and had an endoscopy before but it was years and years ago. Was also seen by Dr. Thomasina at one point several years ago and had piece of steak removed by endoscopy.   Past Medical History:  Diagnosis Date   Alzheimer disease (HCC)    Anxiety    Arthritis    Atrophic gastritis    a. By EGD 02/2013.   Carotid artery disease (HCC)    a. mild-mod plaque, <50% stenosis bilat by duplex 2018.   Coronary atherosclerosis of native coronary artery    a. Multivessel s/p CABG 1996. b. DESx2 to SVG-PDA in 2012 c. DES to distal LM in 11/2019 d. cath in 02/2021 showing patent LM stent, patent LIMA-LAD, patent SVG-OM1 with chronically occluded jump limb to OM2 and chronic occlusion of SVG-D2 and SVG-PDA and no targets for intervention   DDD (degenerative disc disease)    Chronic back pain   Dementia (HCC)    Enlarged prostate    Essential hypertension    Hematuria    History of radiation therapy    Left Lung- 04/26/23-Dr. Lynwood Nasuti    Hypothyroidism    LBBB (left bundle branch block)    MI (myocardial infarction) (HCC)    Mixed hyperlipidemia    OA (osteoarthritis)    OSA (obstructive sleep apnea)    Pneumonia due to COVID-19 virus    February 2021   Sinus bradycardia    a. Aricept  and BB discontinued due to this.       Home Medications Prior to Admission medications   Medication Sig Start Date End Date Taking? Authorizing Provider  albuterol  (PROVENTIL ) (2.5 MG/3ML) 0.083% nebulizer solution Take 3 mLs (2.5 mg total) by nebulization every 2 (two) hours as needed for wheezing or shortness of breath. 11/24/22   Pearlean Manus, MD  albuterol  (VENTOLIN  HFA) 108 (90 Base) MCG/ACT inhaler Inhale 2 puffs into the lungs every 6 (six) hours as needed for wheezing or shortness of breath. 11/24/22   Pearlean Manus, MD  aspirin  EC 81 MG tablet Take 1 tablet (81 mg total) by mouth daily. Swallow whole. Patient not taking: Reported on 05/30/2024 02/24/24   Laurence Locus, DO  Budeson-Glycopyrrol-Formoterol  (BREZTRI  AEROSPHERE) 160-9-4.8 MCG/ACT AERO Inhale 2 puffs into the lungs in the morning and at bedtime. 03/16/23   Sood, Vineet, MD  clopidogrel  (PLAVIX ) 75 MG tablet Take 75 mg by mouth daily. 03/14/24   [provider]  DULoxetine  (CYMBALTA ) 30 MG capsule Take  30 mg by mouth daily. 05/10/23   [provider]  escitalopram  (LEXAPRO ) 20 MG tablet Take 20 mg by mouth every morning.     [provider]  furosemide  (LASIX ) 20 MG tablet Take 1 tablet (20 mg total) by mouth 2 (two) times daily. For fluid 11/24/22   Pearlean Manus, MD  gabapentin  (NEURONTIN ) 100 MG capsule Take 100 mg by mouth 3 (three) times daily. 03/04/23   [provider]  isosorbide  mononitrate (IMDUR ) 120 MG 24 hr tablet Take 1 tablet (120 mg total) by mouth every morning. 11/24/22   Pearlean Manus, MD  levothyroxine  (SYNTHROID ) 75 MCG tablet Take 75 mcg by mouth daily. 02/23/22   [provider]  memantine  (NAMENDA ) 10  MG tablet Take 10 mg by mouth 2 (two) times daily.  03/05/16   [provider]  nitroGLYCERIN  (NITROSTAT ) 0.4 MG SL tablet Place 1 tablet (0.4 mg total) under the tongue every 5 (five) minutes as needed for chest pain. 11/24/22   Pearlean Manus, MD  QUEtiapine  (SEROQUEL ) 25 MG tablet Take 25 mg by mouth at bedtime as needed. 03/14/24   [provider]  ranolazine  (RANEXA ) 500 MG 12 hr tablet Take 500 mg by mouth 2 (two) times daily. 07/06/23   [provider]  risperiDONE  (RISPERDAL ) 0.25 MG tablet Take 0.25 mg by mouth at bedtime. 03/18/23   [provider]  rosuvastatin  (CRESTOR ) 40 MG tablet Take 1 tablet (40 mg total) by mouth daily at 6 PM. 02/24/24 03/25/24  Laurence Locus, DO      Allergies    Patient has no known allergies.    Review of Systems   Review of Systems A 10 point review of systems was performed and is negative unless otherwise reported in HPI.  Physical Exam Updated Vital Signs BP 116/79 (BP Location: Left Arm)   Pulse 71   Temp 97.7 F (36.5 C) (Oral)   Resp 20   Ht 5' 10 (1.778 m)   Wt 93 kg   SpO2 95%   BMI 29.41 kg/m  Physical Exam General: Normal appearing elderly male, lying in bed.  HEENT: Sclera anicteric, MMM, trachea midline.  Cardiology: RRR, no murmurs/rubs/gallops.  Resp: Coarse lung sounds in R lung. On 2L , no resp distress  Abd: Soft, non-tender, non-distended. No rebound tenderness or guarding.  GU: Deferred. MSK: No peripheral edema or signs of trauma.  Skin: warm, dry.  Neuro: A&Ox4, CNs II-XII grossly intact. MAEs. Sensation grossly intact.  Psych: Normal mood and affect.   ED Results / Procedures / Treatments   Labs (all labs ordered are listed, but only abnormal results are displayed) Labs Reviewed  CBC WITH DIFFERENTIAL/PLATELET - Abnormal; Notable for the following components:      Result Value   WBC 19.8 (*)    Hemoglobin 12.9 (*)    Neutro Abs 17.1 (*)    Abs Immature Granulocytes 0.08 (*)     All other components within normal limits  COMPREHENSIVE METABOLIC PANEL WITH GFR - Abnormal; Notable for the following components:   Glucose, Bld 124 (*)    AST 14 (*)    All other components within normal limits  BRAIN NATRIURETIC PEPTIDE - Abnormal; Notable for the following components:   B Natriuretic Peptide 152.0 (*)    All other components within normal limits  MRSA NEXT GEN BY PCR, NASAL  SURGICAL PATHOLOGY  TROPONIN I (HIGH SENSITIVITY)  TROPONIN I (HIGH SENSITIVITY)    EKG EKG Interpretation Date/Time:  Saturday June 30 2024 17:05:14 EDT Ventricular Rate:  65 PR Interval:  179 QRS Duration:  134 QT Interval:  444 QTC Calculation: 462 R Axis:   37  Text Interpretation: Sinus rhythm Left bundle branch block Repolarization abnormalities similar to prior Confirmed by Franklyn Gills [id number]) on 06/30/2024 5:35:58 PM  Radiology CT Chest W Contrast Result Date: 06/30/2024 CLINICAL DATA:  Choking after eating EXAM: CT CHEST WITH CONTRAST TECHNIQUE: Multidetector CT imaging of the chest was performed during intravenous contrast administration. RADIATION DOSE REDUCTION: This exam was performed according to the departmental dose-optimization program which includes automated exposure control, adjustment of the mA and/or kV according to patient size and/or use of iterative reconstruction technique. CONTRAST:  75mL OMNIPAQUE  IOHEXOL  300 MG/ML  SOLN COMPARISON:  Chest x-ray 06/30/2024, chest CT 03/26/2024, 09/20/2023, PET CT 03/24/2023 FINDINGS: Cardiovascular: Moderate aortic atherosclerosis. No aneurysm. Coronary vascular calcifications. Post CABG changes. Cardiomegaly. No significant pericardial effusion Mediastinum/Nodes: Patent trachea. No thyroid  mass. No suspicious lymph nodes. Moderate fluid distension of the esophagus to the level of GE junction. Low-density filling defect at the GE junction measuring 4.2 x 2.4 cm on series 2, image 131. Lungs/Pleura: Emphysema. Irregular airspace disease  and distortion within the left posterior upper lobe and the superior left lower lobe, slightly progressive compared with CT from 03/26/2024, definitely increased from September 2024. Multifocal peribronchovascular nodularity most evident in the right middle lobe and bilateral lower lobes. Upper Abdomen: No acute finding Musculoskeletal: Degenerative changes of the spine. Sternotomy. Acute appearing left 6th posterior rib fracture IMPRESSION: 1. Moderate diffuse fluid distension of the esophagus to the level of GE junction. Low-density filling defect at the GE junction measuring 4.2 x 2.4 cm, suspect for impacted food bolus given clinical history with underlying mass or stricture not excluded. Endoscopy is recommended for further evaluation. 2. Irregular airspace disease and distortion within the left posterior upper lobe and superior left lower lobe, slightly progressive compared with CT from 03/26/2024, definitely increased from September 2024. Findings could be due to evolving post radiation/therapy changes but continued close imaging follow-up is recommended 3. Multifocal peribronchovascular nodularity most evident in the right middle lobe and bilateral lower lobes, suspect for multifocal infection/inflammation or possible aspiration. 4. Acute appearing left sixth posterior rib fracture. 5. Aortic atherosclerosis. Aortic Atherosclerosis (ICD10-I70.0) and Emphysema (ICD10-J43.9). Electronically Signed   By: Luke Bun M.D.   On: 06/30/2024 19:02   DG Chest 2 View Result Date: 06/30/2024 CLINICAL DATA:  Shortness of breath EXAM: CHEST - 2 VIEW COMPARISON:  03/31/2024, CT chest 03/26/2024, radiograph 02/20/2024, 11/23/2022, PET CT 03/24/2023 FINDINGS: Post sternotomy changes. Cardiomegaly. Distortion and irregular density in the left mid to upper lung, unchanged with recent priors but increased compared with remote priors and probably corresponds to history of lung cancer and treatment changes. New patchy  opacities in the right mid and lower lung. Aortic atherosclerosis. No pneumothorax. IMPRESSION: 1. New mild patchy opacities in the right mid and lower lung, suspicious for possible 2. Cardiomegaly without overt edema. 3. Distortion and irregular density in the left mid to upper lung, corresponding to history of lung cancer and treatment changes, reference chest CT from March 2025 Electronically Signed   By: Luke Bun M.D.   On: 06/30/2024 16:24    Procedures Procedures    Medications Ordered in ED Medications  lactated ringers  bolus 1,000 mL (1,000 mLs Intravenous New Bag/Given 06/30/24 1719)  iohexol  (OMNIPAQUE ) 300 MG/ML solution 75 mL (75 mLs Intravenous Contrast Given 06/30/24 1816)  diphenhydrAMINE  (BENADRYL ) injection 25 mg (  25 mg Intravenous Given 06/30/24 1828)  metoCLOPramide  (REGLAN ) injection 10 mg (10 mg Intravenous Given 06/30/24 1828)  morphine  (PF) 4 MG/ML injection 2 mg (2 mg Intravenous Given 06/30/24 1828)    ED Course/ Medical Decision Making/ A&P                          Medical Decision Making Amount and/or Complexity of Data Reviewed Labs: ordered. Decision-making details documented in ED Course. Radiology: ordered. Decision-making details documented in ED Course.  Risk Prescription drug management. Decision regarding hospitalization.    This patient presents to the ED for concern of chest pain, SOB/cough, regurgitation, this involves an extensive number of treatment options, and is a complaint that carries with it a high risk of complications and morbidity.  I considered the following differential and admission for this acute, potentially life threatening condition. Mildly hypoxic, otehrwise HDS, no resp distress.  MDM:    Patient with likely esophageal food impaction given dysphagia worsening acutely yesterday after eating steak, now unable to take solid/liquid PO or even saliva. He also has chronic dysphagia and mild hypoxia - RLL/RML likely aspiration PNA noted  on CXR. CT chest shows fluid-filled esophagus and post-radiation changes in L lung (h/o lung cancer) and R PNA. Given ceftriaxone /azithromycin . Consulted to GI with plan for endoscopy. He was also complaining of mild headache so administered reglan /benadryl .    Clinical Course as of 06/30/24 2145  Sat Jun 30, 2024  1641 DG Chest 2 View 1. New mild patchy opacities in the right mid and lower lung, suspicious for possible 2. Cardiomegaly without overt edema. 3. Distortion and irregular density in the left mid to upper lung, corresponding to history of lung cancer and treatment changes, reference chest CT from March 2025   [HN]  1700 D/w Dr. Thomasina who will come to do endoscopy at 7:30 pm.  [HN]  1734 WBC(!): 19.8 +Leukocytosis [HN]  1854 D/w Dr. Shaaron with GI. W/ fluid-filled esophagus on CT will plan to intubate patient for the procedure. Will consult w/ hospitalist about ICU spot post-procedure [HN]  1908 CT Chest W Contrast 1. Moderate diffuse fluid distension of the esophagus to the level of GE junction. Low-density filling defect at the GE junction measuring 4.2 x 2.4 cm, suspect for impacted food bolus given clinical history with underlying mass or stricture not excluded. Endoscopy is recommended for further evaluation. 2. Irregular airspace disease and distortion within the left posterior upper lobe and superior left lower lobe, slightly progressive compared with CT from 03/26/2024, definitely increased from September 2024. Findings could be due to evolving post radiation/therapy changes but continued close imaging follow-up is recommended 3. Multifocal peribronchovascular nodularity most evident in the right middle lobe and bilateral lower lobes, suspect for multifocal infection/inflammation or possible aspiration. 4. Acute appearing left sixth posterior rib fracture. 5. Aortic atherosclerosis.  [HN]    Clinical Course User Index [HN] Franklyn Sid SAILOR, MD    Labs: I Ordered, and  personally interpreted labs.  The pertinent results include:  those listed above  Imaging Studies ordered: I ordered imaging studies including CXR, CT chest w contrast I independently visualized and interpreted imaging. I agree with the radiologist interpretation  Additional history obtained from chart review, wife at bedside.    Cardiac Monitoring: The patient was maintained on a cardiac monitor.  I personally viewed and interpreted the cardiac monitored which showed an underlying rhythm of: NSR  Reevaluation: After the interventions noted above, I  reevaluated the patient and found that they have :improved  Social Determinants of Health: Lives independently  Disposition:  to endoscopy suite with Dr. Shaaron. Discussed with hospitalists for admission to ICU afterward.   Co morbidities that complicate the patient evaluation  Past Medical History:  Diagnosis Date   Alzheimer disease (HCC)    Anxiety    Arthritis    Atrophic gastritis    a. By EGD 02/2013.   Carotid artery disease (HCC)    a. mild-mod plaque, <50% stenosis bilat by duplex 2018.   Coronary atherosclerosis of native coronary artery    a. Multivessel s/p CABG 1996. b. DESx2 to SVG-PDA in 2012 c. DES to distal LM in 11/2019 d. cath in 02/2021 showing patent LM stent, patent LIMA-LAD, patent SVG-OM1 with chronically occluded jump limb to OM2 and chronic occlusion of SVG-D2 and SVG-PDA and no targets for intervention   DDD (degenerative disc disease)    Chronic back pain   Dementia (HCC)    Enlarged prostate    Essential hypertension    Hematuria    History of radiation therapy    Left Lung- 04/26/23-Dr. Lynwood Nasuti   Hypothyroidism    LBBB (left bundle branch block)    MI (myocardial infarction) (HCC)    Mixed hyperlipidemia    OA (osteoarthritis)    OSA (obstructive sleep apnea)    Pneumonia due to COVID-19 virus    February 2021   Sinus bradycardia    a. Aricept  and BB discontinued due to this.      Medicines Meds ordered this encounter  Medications   lactated ringers  bolus 1,000 mL    I have reviewed the patients home medicines and have made adjustments as needed  Problem List / ED Course: Problem List Items Addressed This Visit   None Visit Diagnoses       Dysphagia, unspecified type    -  Primary     Aspiration pneumonia of right lung, unspecified aspiration pneumonia type, unspecified part of lung (HCC)       Relevant Medications   diphenhydrAMINE  (BENADRYL ) injection 25 mg (Completed)   Ampicillin -Sulbactam (UNASYN ) 3 g in sodium chloride  0.9 % 100 mL IVPB (Start on 06/30/2024 10:30 PM)   albuterol  (PROVENTIL ) (2.5 MG/3ML) 0.083% nebulizer solution 2.5 mg   budesonide -glycopyrrolate -formoterol  (BREZTRI ) 160-9-4.8 MCG/ACT inhaler 2 puff (Start on 06/30/2024 10:45 PM)                   This note was created using dictation software, which may contain spelling or grammatical errors.    Franklyn Sid SAILOR, MD 06/30/24 [phone]

## 2024-07-01 DIAGNOSIS — T18128A Food in esophagus causing other injury, initial encounter: Secondary | ICD-10-CM | POA: Diagnosis not present

## 2024-07-01 DIAGNOSIS — W44F3XA Food entering into or through a natural orifice, initial encounter: Secondary | ICD-10-CM | POA: Diagnosis not present

## 2024-07-01 LAB — BASIC METABOLIC PANEL WITH GFR
Anion gap: 8 (ref 5–15)
BUN: 15 mg/dL (ref 8–23)
CO2: 27 mmol/L (ref 22–32)
Calcium: 8.7 mg/dL — ABNORMAL LOW (ref 8.9–10.3)
Chloride: 104 mmol/L (ref 98–111)
Creatinine, Ser: 1.01 mg/dL (ref 0.61–1.24)
GFR, Estimated: >60 mL/min (ref >60)
Glucose, Bld: 103 mg/dL — ABNORMAL HIGH (ref 70–99)
Potassium: 3.8 mmol/L (ref 3.5–5.1)
Sodium: 139 mmol/L (ref 135–145)

## 2024-07-01 LAB — CBC
HCT: 35.2 % — ABNORMAL LOW (ref 39.0–52.0)
Hemoglobin: 11.3 g/dL — ABNORMAL LOW (ref 13.0–17.0)
MCH: 31 pg (ref 26.0–34.0)
MCHC: 32.1 g/dL (ref 30.0–36.0)
MCV: 96.7 fL (ref 80.0–100.0)
Platelets: 187 K/uL (ref 150–400)
RBC: 3.64 MIL/uL — ABNORMAL LOW (ref 4.22–5.81)
RDW: 13.7 % (ref 11.5–15.5)
WBC: 13.2 K/uL — ABNORMAL HIGH (ref 4.0–10.5)
nRBC: 0 % (ref 0.0–0.2)

## 2024-07-01 LAB — MRSA NEXT GEN BY PCR, NASAL: MRSA by PCR Next Gen: NOT DETECTED

## 2024-07-01 MED ORDER — AMOXICILLIN-POT CLAVULANATE 600-42.9 MG/5ML PO SUSR: 600.0000 mg | Freq: Two times a day (BID) | ORAL | 0 refills | Status: AC

## 2024-07-01 MED ORDER — ORAL CARE MOUTH RINSE: 15.0000 mL | OROMUCOSAL | Status: DC | PRN

## 2024-07-01 MED ORDER — GUAIFENESIN-DM 100-10 MG/5ML PO SYRP: 5.0000 mL | ORAL_SOLUTION | ORAL | 0 refills | Status: DC | PRN

## 2024-07-01 MED ORDER — PHENOL 1.4 % MT LIQD
1.0000 | OROMUCOSAL | Status: DC | PRN
  Administered 2024-07-01: 1 via OROMUCOSAL
  Filled 2024-07-01: qty 177

## 2024-07-01 MED ORDER — PANTOPRAZOLE SODIUM 40 MG PO TBEC: 40.0000 mg | DELAYED_RELEASE_TABLET | Freq: Every day | ORAL | 1 refills | Status: DC

## 2024-07-01 NOTE — Anesthesia Postprocedure Evaluation (Signed)
 Anesthesia Post Note  Patient: Dustin [name]  Procedure(s) Performed: EGD (ESOPHAGOGASTRODUODENOSCOPY)  Patient location during evaluation: PACU Anesthesia Type: General Level of consciousness: awake and alert Pain management: pain level controlled Vital Signs Assessment: post-procedure vital signs reviewed and stable Respiratory status: spontaneous breathing, nonlabored ventilation, respiratory function stable and patient connected to nasal cannula oxygen  Cardiovascular status: blood pressure returned to baseline and stable Postop Assessment: no apparent nausea or vomiting Anesthetic complications: no   No notable events documented.   Last Vitals:  Vitals:   07/01/24 0600 07/01/24 0734  BP: (!) 151/44   Pulse: (!) 59   Resp: 18   Temp:  36.7 C  SpO2: 97%     Last Pain:  Vitals:   07/01/24 0734  TempSrc: Oral  PainSc:                  Andrea Limes

## 2024-07-01 NOTE — Plan of Care (Signed)
  Problem: Education: Goal: Knowledge of General Education information will improve Description: Including pain rating scale, medication(s)/side effects and non-pharmacologic comfort measures Outcome: Progressing   Problem: Clinical Measurements: Goal: Respiratory complications will improve Outcome: Progressing   Problem: Activity: Goal: Risk for activity intolerance will decrease Outcome: Progressing   Problem: Nutrition: Goal: Adequate nutrition will be maintained Outcome: Progressing   Problem: Coping: Goal: Level of anxiety will decrease Outcome: Progressing   Problem: Safety: Goal: Ability to remain free from injury will improve Outcome: Progressing

## 2024-07-01 NOTE — Discharge Summary (Signed)
 Physician Discharge Summary  Dustin [name]:[id number] DOB: 15-Jun-1934 DOA: 06/30/2024  PCP: Bertell Satterfield, MD  Admit date: 06/30/2024  Discharge date: 07/01/2024  Admitted From:Home  Disposition:  Home  Recommendations for Outpatient Follow-up:  Follow up with PCP in 1-2 weeks Follow-up with GI in 1 month with referral sent for repeat EGD and possible dilation at that time Continue PPI daily as prescribed Continue Augmentin  as prescribed to complete 5 days of treatment for aspiration pneumonia Okay to continue other home medications as prior  Home Health: None  Equipment/Devices: Has home 2 L nasal cannula oxygen   Discharge Condition:Stable  CODE STATUS: Full  Diet recommendation: Dysphagia 2 diet  Brief/Interim Summary:    Dustin [name]  is a 89 y.o. male,  with medical history significant for past medical history   of Alzheimer's disease, carotid artery disease, coronary artery disease (Multivessel s/p CABG 1996. b. DESx2 to SVG-PDA in 2012 c. DES to distal LM in 11/2019 d. cath in 02/2021 showing patent LM stent, patent LIMA-LAD, patent SVG-OM1 with chronically occluded jump limb to OM2 and chronic occlusion of SVG-D2 and SVG-PDA and no targets for intervention), hypothyroidism, hypertension, OSA, hypothyroidism, HLD, depression, history of left lung radiation therapy, with chronic hypoxic respiratory failure but typically uses only 2 L of oxygen  at night . - Patient was brought to ED for dysphagia, wife report that he chokes on everything he eats over the last few months, worse was yesterday after he ate his steak, everything he tries to get down come back up since then, over the last 24 hours he could not keep down any liquids or solids, he is having difficulty breathing which prompted him to come to ED, as well he does have chest pressure pain intermittently, he took sublingual nitro several times without much help, he reports he had a piece of steak removed by endoscopy few  years ago. - in ED his workup significant for leukocytosis at 19.8, CT chest significant for distended esophagus with impacted food bolus, and irregular 8 space disease within the left posterior upper lobe and superior left lower lobe, and multifocal nodularity suspicious for aspiration, patient went for endoscopy, required intubation, he was extubated in PACU but he remained with tenuous respiratory status, so [name] was consulted to admit to ICU for further management.  His respiratory status has overall improved and he was started on IV Unasyn  for suspected aspiration pneumonia.  He continues to have some mild oxygen  requirements and has home oxygen  to use.  He is tolerating dysphagia 2 diet which he will remain on and has been seen by GI with plans to follow-up outpatient with repeat EGD with possible dilation and manometry in the next 4 weeks.  He has been recommended to remain on PPI in the meantime.  No other acute events or concerns noted and he is otherwise stable for discharge.  Discharge Diagnoses:  Principal Problem:   Esophageal obstruction due to food impaction Active Problems:   OSA (obstructive sleep apnea)   Alzheimer disease (HCC)   Coronary artery disease - not on betablocker due to baseline bradycardia. Documented by cardiology   Hyperlipidemia   Hypothyroidism   Dementia (HCC)   Chronic diastolic CHF (congestive heart failure) (HCC)   COPD (chronic obstructive pulmonary disease) (HCC)  Principal discharge diagnosis: Acute on chronic hypoxemic respiratory failure secondary to aspiration pneumonia following EGD for food impaction.  Discharge Instructions  Discharge Instructions     Ambulatory referral to Gastroenterology   Complete by:  As directed    What is the reason for referral?: Other   Diet - low sodium heart healthy   Complete by: As directed    Increase activity slowly   Complete by: As directed       Allergies as of 07/01/2024   No Known  Allergies      Medication List     TAKE these medications    albuterol  (2.5 MG/3ML) 0.083% nebulizer solution Commonly known as: PROVENTIL  Take 3 mLs (2.5 mg total) by nebulization every 2 (two) hours as needed for wheezing or shortness of breath.   albuterol  108 (90 Base) MCG/ACT inhaler Commonly known as: VENTOLIN  HFA Inhale 2 puffs into the lungs every 6 (six) hours as needed for wheezing or shortness of breath.   alfuzosin  10 MG 24 hr tablet Commonly known as: UROXATRAL  Take 10 mg by mouth every morning.   amoxicillin -clavulanate 600-42.9 MG/5ML suspension Commonly known as: AUGMENTIN  Take 5 mLs (600 mg total) by mouth 2 (two) times daily for 5 days.   aspirin  EC 81 MG tablet Take 1 tablet (81 mg total) by mouth daily. Swallow whole.   Breztri  Aerosphere 160-9-4.8 MCG/ACT Aero inhaler Generic drug: budesonide -glycopyrrolate -formoterol  Inhale 2 puffs into the lungs in the morning and at bedtime.   clopidogrel  75 MG tablet Commonly known as: PLAVIX  Take 75 mg by mouth daily.   docusate sodium  100 MG capsule Commonly known as: COLACE Take 100 mg by mouth daily as needed for mild constipation.   DULoxetine  30 MG capsule Commonly known as: CYMBALTA  Take 30 mg by mouth daily.   furosemide  20 MG tablet Commonly known as: LASIX  Take 1 tablet (20 mg total) by mouth 2 (two) times daily. For fluid   gabapentin  100 MG capsule Commonly known as: NEURONTIN  Take 100 mg by mouth 3 (three) times daily.   guaiFENesin -dextromethorphan  100-10 MG/5ML syrup Commonly known as: ROBITUSSIN DM Take 5 mLs by mouth every 4 (four) hours as needed for cough.   isosorbide  mononitrate 120 MG 24 hr tablet Commonly known as: IMDUR  Take 1 tablet (120 mg total) by mouth every morning.   levothyroxine  75 MCG tablet Commonly known as: SYNTHROID  Take 75 mcg by mouth daily.   memantine  10 MG tablet Commonly known as: NAMENDA  Take 10 mg by mouth 2 (two) times daily.   nitroGLYCERIN  0.4  MG SL tablet Commonly known as: NITROSTAT  Place 1 tablet (0.4 mg total) under the tongue every 5 (five) minutes as needed for chest pain.   oxycodone  30 MG immediate release tablet Commonly known as: ROXICODONE  Take 150 mg by mouth daily.   pantoprazole  40 MG tablet Commonly known as: Protonix  Take 1 tablet (40 mg total) by mouth daily.   potassium chloride  SA 20 MEQ tablet Commonly known as: KLOR-CON  M Take 20 mEq by mouth daily.   QUEtiapine  25 MG tablet Commonly known as: SEROQUEL  Take 25 mg by mouth at bedtime as needed.   ranolazine  500 MG 12 hr tablet Commonly known as: RANEXA  Take 500 mg by mouth 2 (two) times daily.   risperiDONE  0.25 MG tablet Commonly known as: RISPERDAL  Take 0.25 mg by mouth at bedtime.   rosuvastatin  40 MG tablet Commonly known as: CRESTOR  Take 1 tablet (40 mg total) by mouth daily at 6 PM.        Follow-up Information     Bertell Satterfield, MD. Schedule an appointment as soon as possible for a visit in 1 week(s).   Specialty: Internal Medicine Contact information: [address]  [place] [address] [phone]         Shaaron Lamar HERO, MD. Go in 1 month(s).   Specialty: Gastroenterology Contact information: [address] [place] [address] [phone]                No Known Allergies  Consultations: GI   Procedures/Studies: CT Chest W Contrast Result Date: 06/30/2024 CLINICAL DATA:  Choking after eating EXAM: CT CHEST WITH CONTRAST TECHNIQUE: Multidetector CT imaging of the chest was performed during intravenous contrast administration. RADIATION DOSE REDUCTION: This exam was performed according to the departmental dose-optimization program which includes automated exposure control, adjustment of the mA and/or kV according to patient size and/or use of iterative reconstruction technique. CONTRAST:  75mL OMNIPAQUE  IOHEXOL  300 MG/ML  SOLN COMPARISON:  Chest x-ray 06/30/2024, chest CT 03/26/2024, 09/20/2023,  PET CT 03/24/2023 FINDINGS: Cardiovascular: Moderate aortic atherosclerosis. No aneurysm. Coronary vascular calcifications. Post CABG changes. Cardiomegaly. No significant pericardial effusion Mediastinum/Nodes: Patent trachea. No thyroid  mass. No suspicious lymph nodes. Moderate fluid distension of the esophagus to the level of GE junction. Low-density filling defect at the GE junction measuring 4.2 x 2.4 cm on series 2, image 131. Lungs/Pleura: Emphysema. Irregular airspace disease and distortion within the left posterior upper lobe and the superior left lower lobe, slightly progressive compared with CT from 03/26/2024, definitely increased from September 2024. Multifocal peribronchovascular nodularity most evident in the right middle lobe and bilateral lower lobes. Upper Abdomen: No acute finding Musculoskeletal: Degenerative changes of the spine. Sternotomy. Acute appearing left 6th posterior rib fracture IMPRESSION: 1. Moderate diffuse fluid distension of the esophagus to the level of GE junction. Low-density filling defect at the GE junction measuring 4.2 x 2.4 cm, suspect for impacted food bolus given clinical history with underlying mass or stricture not excluded. Endoscopy is recommended for further evaluation. 2. Irregular airspace disease and distortion within the left posterior upper lobe and superior left lower lobe, slightly progressive compared with CT from 03/26/2024, definitely increased from September 2024. Findings could be due to evolving post radiation/therapy changes but continued close imaging follow-up is recommended 3. Multifocal peribronchovascular nodularity most evident in the right middle lobe and bilateral lower lobes, suspect for multifocal infection/inflammation or possible aspiration. 4. Acute appearing left sixth posterior rib fracture. 5. Aortic atherosclerosis. Aortic Atherosclerosis (ICD10-I70.0) and Emphysema (ICD10-J43.9). Electronically Signed   By: Luke Bun M.D.   On:  06/30/2024 19:02   DG Chest 2 View Result Date: 06/30/2024 CLINICAL DATA:  Shortness of breath EXAM: CHEST - 2 VIEW COMPARISON:  03/31/2024, CT chest 03/26/2024, radiograph 02/20/2024, 11/23/2022, PET CT 03/24/2023 FINDINGS: Post sternotomy changes. Cardiomegaly. Distortion and irregular density in the left mid to upper lung, unchanged with recent priors but increased compared with remote priors and probably corresponds to history of lung cancer and treatment changes. New patchy opacities in the right mid and lower lung. Aortic atherosclerosis. No pneumothorax. IMPRESSION: 1. New mild patchy opacities in the right mid and lower lung, suspicious for possible 2. Cardiomegaly without overt edema. 3. Distortion and irregular density in the left mid to upper lung, corresponding to history of lung cancer and treatment changes, reference chest CT from March 2025 Electronically Signed   By: Luke Bun M.D.   On: 06/30/2024 16:24     Discharge Exam: Vitals:   07/01/24 1130 07/01/24 1200  BP:  (!) 117/56  Pulse:  63  Resp:  18  Temp: 98.3 F (36.8 C)   SpO2:  94%   Vitals:   07/01/24  1000 07/01/24 1100 07/01/24 1130 07/01/24 1200  BP: (!) 126/50 (!) 105/39  (!) 117/56  Pulse: (!) 55 60  63  Resp: 17 16  18   Temp:   98.3 F (36.8 C)   TempSrc:   Oral   SpO2: 94% 92%  94%  Weight:      Height:        General: Pt is alert, awake, not in acute distress Cardiovascular: RRR, S1/S2 +, no rubs, no gallops Respiratory: CTA bilaterally, no wheezing, no rhonchi, 2 L nasal cannula Abdominal: Soft, NT, ND, bowel sounds + Extremities: no edema, no cyanosis    The results of significant diagnostics from this hospitalization (including imaging, microbiology, ancillary and laboratory) are listed below for reference.     Microbiology: Recent Results (from the past 240 hours)  MRSA Next Gen by PCR, Nasal     Status: None   Collection Time: 06/30/24  8:21 PM   Specimen: Nasal Mucosa; Nasal Swab   Result Value Ref Range Status   MRSA by PCR Next Gen NOT DETECTED NOT DETECTED Final    Comment: (NOTE) The GeneXpert MRSA Assay (FDA approved for NASAL specimens only), is one component of a comprehensive MRSA colonization surveillance program. It is not intended to diagnose MRSA infection nor to guide or monitor treatment for MRSA infections. Test performance is not FDA approved in patients less than 81 years old. Performed at [facility], [address] [place], [place] [address]      Labs: BNP (last 3 results) Recent Labs    07/29/23 1927 06/30/24 1720  BNP 126.0* 152.0*   Basic Metabolic Panel: Recent Labs  Lab 06/30/24 1720 07/01/24 0502  NA 140 139  K 4.5 3.8  CL 102 104  CO2 27 27  GLUCOSE 124* 103*  BUN 19 15  CREATININE 1.09 1.01  CALCIUM  9.3 8.7*   Liver Function Tests: Recent Labs  Lab 06/30/24 1720  AST 14*  ALT 12  ALKPHOS 54  BILITOT 1.1  PROT 7.0  ALBUMIN 3.7   No results for input(s): LIPASE, AMYLASE in the last 168 hours. No results for input(s): AMMONIA in the last 168 hours. CBC: Recent Labs  Lab 06/30/24 1720 07/01/24 0502  WBC 19.8* 13.2*  NEUTROABS 17.1*  --   HGB 12.9* 11.3*  HCT 40.5 35.2*  MCV 96.0 96.7  PLT 216 187   Cardiac Enzymes: No results for input(s): CKTOTAL, CKMB, CKMBINDEX, TROPONINI in the last 168 hours. BNP: Invalid input(s): POCBNP CBG: No results for input(s): GLUCAP in the last 168 hours. D-Dimer No results for input(s): DDIMER in the last 72 hours. Hgb A1c No results for input(s): HGBA1C in the last 72 hours. Lipid Profile No results for input(s): CHOL, HDL, LDLCALC, TRIG, CHOLHDL, LDLDIRECT in the last 72 hours. Thyroid  function studies No results for input(s): TSH, T4TOTAL, T3FREE, THYROIDAB in the last 72 hours.  Invalid input(s): FREET3 Anemia work up No results for input(s): VITAMINB12, FOLATE, FERRITIN, TIBC, IRON, RETICCTPCT in the  last 72 hours. Urinalysis    Component Value Date/Time   COLORURINE YELLOW 05/23/2024 1421   APPEARANCEUR CLOUDY (A) 05/23/2024 1421   APPEARANCEUR Clear 03/10/2022 1355   LABSPEC 1.012 05/23/2024 1421   PHURINE 6.0 05/23/2024 1421   GLUCOSEU NEGATIVE 05/23/2024 1421   HGBUR LARGE (A) 05/23/2024 1421   BILIRUBINUR NEGATIVE 05/23/2024 1421   BILIRUBINUR Negative 03/10/2022 1355   KETONESUR NEGATIVE 05/23/2024 1421   PROTEINUR 100 (A) 05/23/2024 1421   UROBILINOGEN 0.2 01/26/2015 2247  NITRITE NEGATIVE 05/23/2024 1421   LEUKOCYTESUR LARGE (A) 05/23/2024 1421   Sepsis Labs Recent Labs  Lab 06/30/24 1720 07/01/24 0502  WBC 19.8* 13.2*   Microbiology Recent Results (from the past 240 hours)  MRSA Next Gen by PCR, Nasal     Status: None   Collection Time: 06/30/24  8:21 PM   Specimen: Nasal Mucosa; Nasal Swab  Result Value Ref Range Status   MRSA by PCR Next Gen NOT DETECTED NOT DETECTED Final    Comment: (NOTE) The GeneXpert MRSA Assay (FDA approved for NASAL specimens only), is one component of a comprehensive MRSA colonization surveillance program. It is not intended to diagnose MRSA infection nor to guide or monitor treatment for MRSA infections. Test performance is not FDA approved in patients less than 44 years old. Performed at [facility], [address] [place], [place] [address]      Time coordinating discharge: 35 minutes  SIGNED:   Adron JONETTA Fairly, DO [name] 07/01/2024, 2:27 PM  If 7PM-7AM, please contact night-coverage [email or web address]

## 2024-07-01 NOTE — Evaluation (Signed)
 Physical Therapy Evaluation Patient Details Name: Dustin [name] MRN: [id number] DOB: 1934/04/25 Today's Date: 07/01/2024  History of Present Illness  Dustin [name]  is a 89 y.o. male,  with medical history significant for past medical history   of Alzheimer's disease, carotid artery disease, coronary artery disease (Multivessel s/p CABG 1996. b. DESx2 to SVG-PDA in 2012 c. DES to distal LM in 11/2019 d. cath in 02/2021 showing patent LM stent, patent LIMA-LAD, patent SVG-OM1 with chronically occluded jump limb to OM2 and chronic occlusion of SVG-D2 and SVG-PDA and no targets for intervention), hypothyroidism, hypertension, OSA, hypothyroidism, HLD, depression, history of left lung radiation therapy, with chronic hypoxic respiratory failure but typically uses only 2 L of oxygen  at night .  - Patient was brought to ED for dysphagia, wife report that he chokes on everything he eats over the last few months, worse was yesterday after he ate his steak, everything he tries to get down come back up since then, over the last 24 hours he could not keep down any liquids or solids, he is having difficulty breathing which prompted him to come to ED, as well he does have chest pressure pain intermittently, he took sublingual nitro several times without much help, he reports he had a piece of steak removed by endoscopy few years ago.   Clinical Impression  Patient functioning near baseline for functional mobility and gait demonstrating good return for ambulating in room, hallways without loss of balance or need for an AD. Patient required 2 LPM O2 due to SpO2 dropping to from 95% to 87% when on room air sitting at bedside, patient SpO2 at 94% while on 2 LPM when walking.  Patient tolerated sitting up in chair after therapy and encouraged to ambulate with nursing staff/Mobility tech for length of stay. Plan:  Patient discharged from physical therapy to care of nursing for ambulation daily as tolerated for length of stay.            If plan is discharge home, recommend the following: Help with stairs or ramp for entrance   Can travel by private vehicle        Equipment Recommendations None recommended by PT  Recommendations for Other Services       Functional Status Assessment Patient has not had a recent decline in their functional status     Precautions / Restrictions Precautions Precautions: None Restrictions Weight Bearing Restrictions Per Provider Order: No      Mobility  Bed Mobility Overal bed mobility: Modified Independent                  Transfers Overall transfer level: Modified independent                      Ambulation/Gait Ambulation/Gait assistance: Modified independent (Device/Increase time) Gait Distance (Feet): 100 Feet Assistive device: None Gait Pattern/deviations: WFL(Within Functional Limits) Gait velocity: decreased     General Gait Details: grossly WFL with good return for ambulating in room, hallways without loss of balance or need ofr an AD, on 2 LPM O2 with SpO2 at 94%  Stairs            Wheelchair Mobility     Tilt Bed    Modified Rankin (Stroke Patients Only)       Balance Overall balance assessment: No apparent balance deficits (not formally assessed)  Pertinent Vitals/Pain Pain Assessment Pain Assessment: No/denies pain    Home Living Family/patient expects to be discharged to:: Private residence Living Arrangements: Spouse/significant other;Children Available Help at Discharge: Family;Available PRN/intermittently Type of Home: House Home Access: Level entry       Home Layout: One level Home Equipment: Agricultural consultant (2 wheels);Rollator (4 wheels);Cane - single point      Prior Function Prior Level of Function : Independent/Modified Independent             Mobility Comments: household and short community distances without AD, uses Rollator  for longer distances ADLs Comments: assisted by family     Extremity/Trunk Assessment   Upper Extremity Assessment Upper Extremity Assessment: Defer to OT evaluation    Lower Extremity Assessment Lower Extremity Assessment: Overall WFL for tasks assessed    Cervical / Trunk Assessment Cervical / Trunk Assessment: Normal  Communication   Communication Communication: No apparent difficulties    Cognition Arousal: Alert Behavior During Therapy: WFL for tasks assessed/performed   PT - Cognitive impairments: No apparent impairments                         Following commands: Intact       Cueing Cueing Techniques: Verbal cues     General Comments      Exercises     Assessment/Plan    PT Assessment Patient does not need any further PT services  PT Problem List         PT Treatment Interventions      PT Goals (Current goals can be found in the Care Plan section)  Acute Rehab PT Goals Patient Stated Goal: return home with family to assist PT Goal Formulation: With patient Time For Goal Achievement: 07/01/24 Potential to Achieve Goals: Good    Frequency       Co-evaluation               AM-PAC PT 6 Clicks Mobility  Outcome Measure Help needed turning from your back to your side while in a flat bed without using bedrails?: None Help needed moving from lying on your back to sitting on the side of a flat bed without using bedrails?: None Help needed moving to and from a bed to a chair (including a wheelchair)?: None Help needed standing up from a chair using your arms (e.g., wheelchair or bedside chair)?: None Help needed to walk in hospital room?: A Little Help needed climbing 3-5 steps with a railing? : A Little 6 Click Score: 22    End of Session Equipment Utilized During Treatment: Oxygen  Activity Tolerance: Patient tolerated treatment well Patient left: in chair;with call bell/phone within reach Nurse Communication: Mobility  status PT Visit Diagnosis: Unsteadiness on feet (R26.81);Other abnormalities of gait and mobility (R26.89);Muscle weakness (generalized) (M62.81)    Time: 8863-8794 PT Time Calculation (min) (ACUTE ONLY): 29 min   Charges:   PT Evaluation $PT Eval Moderate Complexity: 1 Mod PT Treatments $Therapeutic Activity: 23-37 mins PT General Charges $$ ACUTE PT VISIT: 1 Visit         2:02 PM, 07/01/24 Lynwood Music, MPT Physical Therapist with [facility] 336 [phone] office [phone] mobile phone

## 2024-07-01 NOTE — Evaluation (Signed)
 Clinical/Bedside Swallow Evaluation Patient Details  Name: Dustin [name] MRN: [id number] Date of Birth: 01-28-1934  Today's Date: 07/01/2024 Time: SLP Start Time (ACUTE ONLY): 1020 SLP Stop Time (ACUTE ONLY): 1033 SLP Time Calculation (min) (ACUTE ONLY): 13 min  Past Medical History:  Past Medical History:  Diagnosis Date   Alzheimer disease (HCC)    Anxiety    Arthritis    Atrophic gastritis    a. By EGD 02/2013.   Carotid artery disease (HCC)    a. mild-mod plaque, <50% stenosis bilat by duplex 2018.   Coronary atherosclerosis of native coronary artery    a. Multivessel s/p CABG 1996. b. DESx2 to SVG-PDA in 2012 c. DES to distal LM in 11/2019 d. cath in 02/2021 showing patent LM stent, patent LIMA-LAD, patent SVG-OM1 with chronically occluded jump limb to OM2 and chronic occlusion of SVG-D2 and SVG-PDA and no targets for intervention   DDD (degenerative disc disease)    Chronic back pain   Dementia (HCC)    Enlarged prostate    Essential hypertension    Hematuria    History of radiation therapy    Left Lung- 04/26/23-Dr. Lynwood Nasuti   Hypothyroidism    LBBB (left bundle branch block)    MI (myocardial infarction) (HCC)    Mixed hyperlipidemia    OA (osteoarthritis)    OSA (obstructive sleep apnea)    Pneumonia due to COVID-19 virus    February 2021   Sinus bradycardia    a. Aricept  and BB discontinued due to this.   Past Surgical History:  Past Surgical History:  Procedure Laterality Date   CATARACT EXTRACTION W/PHACO Left 03/22/2022   Procedure: CATARACT EXTRACTION PHACO AND INTRAOCULAR LENS PLACEMENT (IOC);  Surgeon: Harrie Lynwood, MD;  Location: [facility];  Service: Ophthalmology;  Laterality: Left;  CDE: 25.22   COLONOSCOPY  08/03/2004   Jenkins-numerous large diverticula in the descending, transverse, descending, and sigmoid colon. Otherwise normal exam.   COLONOSCOPY  07/12/2012   RMR: External hemorrhoidal tag; multiple rectal and colonic polyps removed and/or  treated as described above. Pancolonic diverticulosis. Bx-tubular adenomas, rectal hyperplastic polyp. next colonoscopy in 06/2015.   COLONOSCOPY N/A 06/22/2016   Procedure: COLONOSCOPY;  Surgeon: Oneil Budge, MD;  Location: [facility];  Service: Gastroenterology;  Laterality: N/A;  730   CORONARY ANGIOPLASTY WITH STENT PLACEMENT     CORONARY ARTERY BYPASS GRAFT  1996   LIMA to LAD, SVG to D2, SVG to PDA, SVG to OM1 and OM2   CORONARY STENT INTERVENTION N/A 12/10/2019   Procedure: CORONARY STENT INTERVENTION;  Surgeon: Verlin Lonni BIRCH, MD;  Location: [facility];  Service: Cardiovascular;  Laterality: N/A;   CORONARY ULTRASOUND/IVUS N/A 12/10/2019   Procedure: Intravascular Ultrasound/IVUS;  Surgeon: Verlin Lonni BIRCH, MD;  Location: [facility];  Service: Cardiovascular;  Laterality: N/A;   ESOPHAGOGASTRODUODENOSCOPY N/A 03/16/2013   Procedure: ESOPHAGOGASTRODUODENOSCOPY (EGD);  Surgeon: Lamar CHRISTELLA Hollingshead, MD;  Location: [facility];  Service: Endoscopy;  Laterality: N/A;  12:00-moved to 1030 Leigh Ann notified pt   ESOPHAGOGASTRODUODENOSCOPY N/A 03/06/2013   Procedure: ESOPHAGOGASTRODUODENOSCOPY (EGD);  Surgeon: Lamar CHRISTELLA Hollingshead, MD;  Location: [facility];  Service: Endoscopy;  Laterality: N/A;   HERNIA REPAIR     LEFT HEART CATH AND CORS/GRAFTS ANGIOGRAPHY N/A 12/10/2019   Procedure: LEFT HEART CATH AND CORS/GRAFTS ANGIOGRAPHY;  Surgeon: Verlin Lonni BIRCH, MD;  Location: [facility];  Service: Cardiovascular;  Laterality: N/A;   LEFT HEART CATH AND CORS/GRAFTS ANGIOGRAPHY  N/A 03/02/2021   Procedure: LEFT HEART CATH AND CORS/GRAFTS ANGIOGRAPHY;  Surgeon: Mady Bruckner, MD;  Location: [facility];  Service: Cardiovascular;  Laterality: N/A;   HPI:  Dustin [name] is a 89 y.o. male presented to the ED with report of not being able to swallow anything since getting some dried beef caught in his throat yesterday (06/29/2024).  Cannot even handle  his secretions.  Seen by EDP a question of some aspiration.  Reportedly on 2 L O2 at home.  Patchy infiltrate right middle right lower lobe; chest CT and labs are pending.  He is oxygenating very well in the ED with 2 L nasal prongs.  Distant history of food impaction back in 2014 he was disimpacted here.  Subsequent endoscopy demonstrated normal-appearing tubular esophagus.      CT chest revealed fluid/debris in the esophagus, went for emergent endoscopy (06/30/2024), discussed with GI, it was noted large column of food debris in the esophagus. Pushed it down into the stomach. -Mentation for clear liquid diet, upright with meals, PPI twice daily, he will need repeat EGD for complete elective examination in 2 to 3 weeks.    Assessment / Plan / Recommendation  Clinical Impression  As this writer was entering pt's room, Dr Shaaron (GI) was leaving. Dr Shaaron expressed that dysphagia 2 diet would be most advanced diet he recommended until pt followed up with him in his office - pt to avoid solid meats and bread. During this assessment, pt consumed puree, soft solids and thin liquids via straw without overt s/s of oropharyngeal dysphagia or aspiration. Pt's vitals remained stable throughout. Education provided to pt on diet recommendation for soft solids. He voiced understanding and stated that he like fish and soft chicken as his response. At this time, pt appears at reduced risk of aspiration from an oropharyngeal perspective when following general aspiration precautions. Skilled ST services are not indicated at this time. SLP Visit Diagnosis: Dysphagia, unspecified (R13.10)    Aspiration Risk  Mild aspiration risk    Diet Recommendation Dysphagia 2 (Fine chop);Thin liquid    Liquid Administration via: Straw;Cup Medication Administration: Whole meds with liquid Supervision: Patient able to self feed;Intermittent supervision to cue for compensatory strategies Compensations: Minimize environmental  distractions;Slow rate;Small sips/bites Postural Changes: Seated upright at 90 degrees;Remain upright for at least 30 minutes after po intake    Other  Recommendations Oral Care Recommendations: Oral care BID     Functional Status Assessment Patient has not had a recent decline in their functional status   Swallow Study   General Date of Onset: 06/29/24 HPI: Dustin [name] is a 89 y.o. male presented to the ED with report of not being able to swallow anything since getting some dried beef caught in his throat yesterday (06/29/2024).  Cannot even handle his secretions.  Seen by EDP a question of some aspiration.  Reportedly on 2 L O2 at home.  Patchy infiltrate right middle right lower lobe; chest CT and labs are pending.  He is oxygenating very well in the ED with 2 L nasal prongs.  Distant history of food impaction back in 2014 he was disimpacted here.  Subsequent endoscopy demonstrated normal-appearing tubular esophagus.      CT chest revealed fluid/debris in the esophagus, went for emergent endoscopy (06/30/2024), discussed with GI, it was noted large column of food debris in the esophagus. Pushed it down into the stomach. -Mentation for clear liquid diet, upright with meals, PPI twice daily, he will  need repeat EGD for complete elective examination in 2 to 3 weeks. Type of Study: Bedside Swallow Evaluation Previous Swallow Assessment: multiple d/t globus sensation Diet Prior to this Study: Full liquid diet Temperature Spikes Noted: No Respiratory Status: Nasal cannula History of Recent Intubation: Yes Total duration of intubation (days):  (procedure only) Date extubated: 06/30/24 Behavior/Cognition: Alert;Cooperative;Pleasant mood;Confused;Distractible Oral Cavity Assessment: Within Functional Limits Oral Care Completed by SLP: No Vision: Functional for self-feeding Self-Feeding Abilities: Able to feed self Patient Positioning: Upright in chair Baseline Vocal Quality: Normal Volitional  Cough: Strong Volitional Swallow: Able to elicit    Oral/Motor/Sensory Function Overall Oral Motor/Sensory Function: Within functional limits   Ice Chips Ice chips: Not tested   Thin Liquid Thin Liquid: Within functional limits Presentation: Self Fed;Straw    Nectar Thick Nectar Thick Liquid: Not tested   Honey Thick Honey Thick Liquid: Not tested   Puree Puree: Within functional limits   Solid     Solid: Within functional limits Presentation: Self Fed Other Comments: soft solid     Nethan Caudillo B. Rubbie, M.S., CCC-SLP, Tree surgeon Certified Brain Injury Specialist [facility]  [facility] Office [phone] Ascom [phone] Fax [phone]

## 2024-07-01 NOTE — Progress Notes (Signed)
SATURATION QUALIFICATIONS: (This note is used to comply with regulatory documentation for home oxygen)  Patient Saturations on Room Air at Rest = 87%  Patient Saturations on Room Air while Ambulating = 85%  Patient Saturations on 2 Liters of oxygen while Ambulating = 94%  Please briefly explain why patient needs home oxygen: 

## 2024-07-01 NOTE — Progress Notes (Signed)
 Patient belongings have been returned. Patient has been given discharge instructions with no further questions. Patient discharged on 2L [place] with his portable tank from home.

## 2024-07-01 NOTE — Plan of Care (Signed)
  Problem: Education: Goal: Knowledge of General Education information will improve Description: Including pain rating scale, medication(s)/side effects and non-pharmacologic comfort measures Outcome: Progressing   Problem: Nutrition: Goal: Adequate nutrition will be maintained Outcome: Progressing   Problem: Coping: Goal: Level of anxiety will decrease Outcome: Progressing   Problem: Elimination: Goal: Will not experience complications related to bowel motility Outcome: Progressing Goal: Will not experience complications related to urinary retention Outcome: Progressing   Problem: Pain Managment: Goal: General experience of comfort will improve and/or be controlled Outcome: Progressing

## 2024-07-01 NOTE — Progress Notes (Signed)
   07/01/24 1500  TOC Brief Assessment  Insurance and Status Reviewed  Patient has primary care physician Yes  Home environment has been reviewed From home c/wife  Prior level of function: Independent  Prior/Current Home Services No current home services  Social Drivers of Health Review SDOH reviewed no interventions necessary  Readmission risk has been reviewed Yes  Transition of care needs no transition of care needs at this time   Pt active c/[employer] home oxygen . Confirmed c/Sheila at [employer] that pt has a concentrator and a portable. Pt to dc home today.

## 2024-07-01 NOTE — Progress Notes (Signed)
 Patient wants to go home.  Seen in the ICU Discussed with speech pathology at bedside.  He has been tolerating clear liquids  Vital signs in last 24 hours: Temp:  [97.7 F (36.5 C)-98.8 F (37.1 C)] 98 F (36.7 C) (07/06 0734) Pulse Rate:  [58-106] 59 (07/06 0600) Resp:  [17-34] 18 (07/06 0600) BP: (109-157)/(41-94) 151/44 (07/06 0600) SpO2:  [85 %-100 %] 97 % (07/06 0600) Weight:  [92.9 kg-93 kg] 92.9 kg (07/05 2037)   General:   Alert, elderly somewhat frail gentleman pleasant in no acute distress.    Intake/Output from previous day: 07/05 0701 - 07/06 0700 In: 422.8 [P.O.:240; IV Piggyback:182.8] Out: 650 [Urine:650] Intake/Output this shift: No intake/output data recorded.  Lab Results: Recent Labs    06/30/24 1720 07/01/24 0502  WBC 19.8* 13.2*  HGB 12.9* 11.3*  HCT 40.5 35.2*  PLT 216 187   BMET Recent Labs    06/30/24 1720 07/01/24 0502  NA 140 139  K 4.5 3.8  CL 102 104  CO2 27 27  GLUCOSE 124* 103*  BUN 19 15  CREATININE 1.09 1.01  CALCIUM  9.3 8.7*   LFT Recent Labs    06/30/24 1720  PROT 7.0  ALBUMIN 3.7  AST 14*  ALT 12  ALKPHOS 54  BILITOT 1.1   PT/INR No results for input(s): LABPROT, INR in the last 72 hours. Hepatitis Panel No results for input(s): HEPBSAG, HCVAB, HEPAIGM, HEPBIGM in the last 72 hours. C-Diff No results for input(s): CDIFFTOX in the last 72 hours.  Studies/Results: CT Chest W Contrast Result Date: 06/30/2024 CLINICAL DATA:  Choking after eating EXAM: CT CHEST WITH CONTRAST TECHNIQUE: Multidetector CT imaging of the chest was performed during intravenous contrast administration. RADIATION DOSE REDUCTION: This exam was performed according to the departmental dose-optimization program which includes automated exposure control, adjustment of the mA and/or kV according to patient size and/or use of iterative reconstruction technique. CONTRAST:  75mL OMNIPAQUE  IOHEXOL  300 MG/ML  SOLN COMPARISON:  Chest x-ray  06/30/2024, chest CT 03/26/2024, 09/20/2023, PET CT 03/24/2023 FINDINGS: Cardiovascular: Moderate aortic atherosclerosis. No aneurysm. Coronary vascular calcifications. Post CABG changes. Cardiomegaly. No significant pericardial effusion Mediastinum/Nodes: Patent trachea. No thyroid  mass. No suspicious lymph nodes. Moderate fluid distension of the esophagus to the level of GE junction. Low-density filling defect at the GE junction measuring 4.2 x 2.4 cm on series 2, image 131. Lungs/Pleura: Emphysema. Irregular airspace disease and distortion within the left posterior upper lobe and the superior left lower lobe, slightly progressive compared with CT from 03/26/2024, definitely increased from September 2024. Multifocal peribronchovascular nodularity most evident in the right middle lobe and bilateral lower lobes. Upper Abdomen: No acute finding Musculoskeletal: Degenerative changes of the spine. Sternotomy. Acute appearing left 6th posterior rib fracture IMPRESSION: 1. Moderate diffuse fluid distension of the esophagus to the level of GE junction. Low-density filling defect at the GE junction measuring 4.2 x 2.4 cm, suspect for impacted food bolus given clinical history with underlying mass or stricture not excluded. Endoscopy is recommended for further evaluation. 2. Irregular airspace disease and distortion within the left posterior upper lobe and superior left lower lobe, slightly progressive compared with CT from 03/26/2024, definitely increased from September 2024. Findings could be due to evolving post radiation/therapy changes but continued close imaging follow-up is recommended 3. Multifocal peribronchovascular nodularity most evident in the right middle lobe and bilateral lower lobes, suspect for multifocal infection/inflammation or possible aspiration. 4. Acute appearing left sixth posterior rib fracture. 5. Aortic atherosclerosis. Aortic  Atherosclerosis (ICD10-I70.0) and Emphysema (ICD10-J43.9).  Electronically Signed   By: Luke Bun M.D.   On: 06/30/2024 19:02   DG Chest 2 View Result Date: 06/30/2024 CLINICAL DATA:  Shortness of breath EXAM: CHEST - 2 VIEW COMPARISON:  03/31/2024, CT chest 03/26/2024, radiograph 02/20/2024, 11/23/2022, PET CT 03/24/2023 FINDINGS: Post sternotomy changes. Cardiomegaly. Distortion and irregular density in the left mid to upper lung, unchanged with recent priors but increased compared with remote priors and probably corresponds to history of lung cancer and treatment changes. New patchy opacities in the right mid and lower lung. Aortic atherosclerosis. No pneumothorax. IMPRESSION: 1. New mild patchy opacities in the right mid and lower lung, suspicious for possible 2. Cardiomegaly without overt edema. 3. Distortion and irregular density in the left mid to upper lung, corresponding to history of lung cancer and treatment changes, reference chest CT from March 2025 Electronically Signed   By: Luke Bun M.D.   On: 06/30/2024 16:24   Impression: 89 year old gentleman multiple comorbidities presented yesterday with a food impaction; emergency EGD performed to disimpact.  No obvious stricture in the esophagus.  Diagnosed with aspiration pneumonia on presentation to the ED.  It appears he has done well overnight on clear liquids.  Pulmonary status is stable.  Leukocytosis has improved. Based on endoscopy findings, need to be concerned about an underlying esophageal motility disorder.  Esophageal biopsies pending.  Recommendations:  *Discussed with speech pathology; agree with progression to a dysphagia 2 diet as tolerated  *Daily PPI  *Follow-up on esophageal biopsies  *Follow-up with us  in about a month; needs an elective complete upper endoscopic evaluation.  +/- Empiric esophageal dilation  *Ultimately, patient may benefit from undergoing esophageal manometry.

## 2024-07-02 ENCOUNTER — Encounter (HOSPITAL_COMMUNITY): Payer: Self-pay | Admitting: Internal Medicine

## 2024-07-02 DIAGNOSIS — F1120 Opioid dependence, uncomplicated: Secondary | ICD-10-CM | POA: Diagnosis not present

## 2024-07-02 DIAGNOSIS — G894 Chronic pain syndrome: Secondary | ICD-10-CM | POA: Diagnosis not present

## 2024-07-02 DIAGNOSIS — M4302 Spondylolysis, cervical region: Secondary | ICD-10-CM | POA: Diagnosis not present

## 2024-07-02 DIAGNOSIS — J449 Chronic obstructive pulmonary disease, unspecified: Secondary | ICD-10-CM | POA: Diagnosis not present

## 2024-07-02 DIAGNOSIS — I5022 Chronic systolic (congestive) heart failure: Secondary | ICD-10-CM | POA: Diagnosis not present

## 2024-07-02 DIAGNOSIS — F0280 Dementia in other diseases classified elsewhere without behavioral disturbance: Secondary | ICD-10-CM | POA: Diagnosis not present

## 2024-07-02 DIAGNOSIS — E6609 Other obesity due to excess calories: Secondary | ICD-10-CM | POA: Diagnosis not present

## 2024-07-02 DIAGNOSIS — M48061 Spinal stenosis, lumbar region without neurogenic claudication: Secondary | ICD-10-CM | POA: Diagnosis not present

## 2024-07-02 DIAGNOSIS — Z6830 Body mass index (BMI) 30.0-30.9, adult: Secondary | ICD-10-CM | POA: Diagnosis not present

## 2024-07-02 NOTE — Op Note (Signed)
 [facility] Patient Name: Dustin [name] Procedure Date: 06/30/2024 6:37 PM MRN: [id number] Date of Birth: 1934/10/13 Attending MD: Lamar Ozell Hollingshead , MD, [id number] CSN: [id number] Age: 89 Admit Type: Outpatient Procedure:                Upper GI endoscopy Indications:              Dysphagia /food impaction Providers:                Lamar Ozell Hollingshead, MD, Olam Ada, RN, Gordy Lonni Balm, Technician Referring MD:              Medicines:                General Anesthesia Complications:            No immediate complications. Estimated Blood Loss:     Estimated blood loss was minimal. Procedure:                Pre-Anesthesia Assessment:                           - Prior to the procedure, a History and Physical                            was performed, and patient medications and                            allergies were reviewed. The patient's tolerance of                            previous anesthesia was also reviewed. The risks                            and benefits of the procedure and the sedation                            options and risks were discussed with the patient.                            All questions were answered, and informed consent                            was obtained. Prior Anticoagulants: The patient                            last took Plavix  (clopidogrel ) 1 day prior to the                            procedure. ASA Grade Assessment: III - A patient                            with severe systemic disease. After reviewing the  risks and benefits, the patient was deemed in                            satisfactory condition to undergo the procedure.                           After obtaining informed consent, the endoscope was                            passed under direct vision. Throughout the                            procedure, the patient's blood pressure, pulse, and                             oxygen  saturations were monitored continuously. The                            [id number]-H190 ([id number]) scope was introduced through the                            mouth, and advanced to the body of the stomach. The                            upper GI endoscopy was accomplished without                            difficulty. The patient tolerated the procedure                            well. Scope In: 7:22:18 PM Scope Out: 7:31:59 PM Total Procedure Duration: 0 hours 9 minutes 41 seconds  Findings:      Column of food and fluid coming involving involving two thirds of the       distal esophagus. Utilizing jumbo biopsy forceps, I disrupted the food       boluses and apply gentle pressure with the scope and was able to push       the food down into the stomach. The distal third of the esophagus was       markedly inflamed/macerated. No obvious stricture. The esophagus       appeared widely patent. No attempts at completing examination were made.       I did go ahead and biopsy the mid and distal esophagus to evaluate for       EOE. Impression:               - Bulky food impaction removed as described above;                            incomplete examination per plan - status post                            esophageal biopsy                           - Based  on appearance patient may have underlying                            esophageal motility disorder.. Moderate Sedation:      Moderate (conscious) sedation was personally administered by an       anesthesia professional. The following parameters were monitored: oxygen        saturation, heart rate, blood pressure, respiratory rate, EKG, adequacy       of pulmonary ventilation, and response to care. Recommendation:           - Return patient to ICU for ongoing care.                           - Clear liquid diet. Patient needs to be upright                            with meals. Twice daily PPI therapy. Will need an                             elective EGD in approximately 3 weeks. Would not                            advance beyond a dysphagia 2 diet at discharge.                           -Discussed with patient's wife, Dustin [name], in the                            waiting room. Reviewed findings and                            recommendations. Her questions were answered. Procedure Code(s):        --- Professional ---                           [phone], 52, Esophagogastroduodenoscopy, flexible,                            transoral; diagnostic, including collection of                            specimen(s) by brushing or washing, when performed                            (separate procedure) Diagnosis Code(s):        --- Professional ---                           R13.10, Dysphagia, unspecified CPT copyright 2022 American Medical Association. All rights reserved. The codes documented in this report are preliminary and upon coder review may  be revised to meet current compliance requirements. Lamar HERO. Lyanna Blystone, MD Lamar Ozell Hollingshead, MD 06/30/2024 7:59:18 PM This report has been signed electronically. Number of Addenda: 0

## 2024-07-03 ENCOUNTER — Encounter: Payer: Self-pay | Admitting: Gastroenterology

## 2024-07-03 LAB — SURGICAL PATHOLOGY

## 2024-07-05 ENCOUNTER — Ambulatory Visit: Payer: Self-pay | Admitting: Internal Medicine

## 2024-07-08 ENCOUNTER — Encounter (HOSPITAL_COMMUNITY): Payer: Self-pay | Admitting: Emergency Medicine

## 2024-07-08 ENCOUNTER — Other Ambulatory Visit: Payer: Self-pay

## 2024-07-08 ENCOUNTER — Emergency Department (HOSPITAL_COMMUNITY)
Admission: EM | Admit: 2024-07-08 | Discharge: 2024-07-08 | Disposition: A | Discharge status: Home / Self Care | Attending: Emergency Medicine | Admitting: Emergency Medicine

## 2024-07-08 ENCOUNTER — Emergency Department (HOSPITAL_COMMUNITY)

## 2024-07-08 DIAGNOSIS — K5903 Drug induced constipation: Secondary | ICD-10-CM | POA: Insufficient documentation

## 2024-07-08 DIAGNOSIS — T402X5A Adverse effect of other opioids, initial encounter: Secondary | ICD-10-CM | POA: Insufficient documentation

## 2024-07-08 DIAGNOSIS — K5900 Constipation, unspecified: Secondary | ICD-10-CM | POA: Diagnosis not present

## 2024-07-08 DIAGNOSIS — Z7982 Long term (current) use of aspirin: Secondary | ICD-10-CM | POA: Diagnosis not present

## 2024-07-08 DIAGNOSIS — R112 Nausea with vomiting, unspecified: Secondary | ICD-10-CM | POA: Diagnosis not present

## 2024-07-08 DIAGNOSIS — Z7902 Long term (current) use of antithrombotics/antiplatelets: Secondary | ICD-10-CM | POA: Diagnosis not present

## 2024-07-08 MED ORDER — LINACLOTIDE 145 MCG PO CAPS: 145.0000 ug | ORAL_CAPSULE | Freq: Every day | ORAL | 3 refills | Status: DC

## 2024-07-08 MED ORDER — NITROGLYCERIN 0.4 MG SL SUBL: 0.4000 mg | SUBLINGUAL_TABLET | SUBLINGUAL | 5 refills | Status: DC | PRN

## 2024-07-08 NOTE — ED Notes (Signed)
..  The patient is A&OX4, ambulatory at d/c with independent steady gait, NAD. Pt verbalized understanding of d/c instructions, prescriptions and follow up care.

## 2024-07-08 NOTE — Discharge Instructions (Addendum)
 Please take MiraLAX  1 scoop 3 times per day Colace twice a day Linzess  once a day Try to reduce the amount of oxycodone  that you take  Follow-up with your gastroenterologist in 2 weeks ER for severe or worsening symptoms.

## 2024-07-08 NOTE — ED Triage Notes (Signed)
 Pt via POV c/o constipation, LBM 3 weeks ago. No pain. Pt feels nauseated and vomited this morning 3x since waking.

## 2024-07-08 NOTE — ED Provider Notes (Signed)
 [place] EMERGENCY DEPARTMENT AT [facility] Provider Note   CSN: [id number] Arrival date & time: 07/08/24  1459     Patient presents with: Constipation   Dustin [name] is a 89 y.o. male.    Constipation  This patient is an 89 year old male with a history of chronic oxycodone  use, evidently he takes up to 30 mg of oxycodone  5 times per day although he reports that he is trying to cut back because he is frustrated with the constipation.  The patient is struggled with constipation for many years but seems to be getting worse and states he has not had any bowel movement in the last 3 weeks.  He did have 1 episode of vomiting at 2:00 in the morning but is not vomiting now in fact when he woke up this morning he ate sausage and eggs for breakfast and has held it down without difficulty.  He recently did have a urinary catheter for obstructive uropathy but this was able to be removed and he has been urinating successfully without difficulty.  He has no abdominal pain per se but does feel like he is just distended and bloated.  He is passing gas    Prior to Admission medications   Medication Sig Start Date End Date Taking? Authorizing Provider  linaclotide  (LINZESS ) 145 MCG CAPS capsule Take 1 capsule (145 mcg total) by mouth daily before breakfast. 07/08/24  Yes Cleotilde Rogue, MD  albuterol  (PROVENTIL ) (2.5 MG/3ML) 0.083% nebulizer solution Take 3 mLs (2.5 mg total) by nebulization every 2 (two) hours as needed for wheezing or shortness of breath. 11/24/22   Pearlean Manus, MD  albuterol  (VENTOLIN  HFA) 108 (90 Base) MCG/ACT inhaler Inhale 2 puffs into the lungs every 6 (six) hours as needed for wheezing or shortness of breath. 11/24/22   Pearlean Manus, MD  alfuzosin  (UROXATRAL ) 10 MG 24 hr tablet Take 10 mg by mouth every morning. 06/19/24   [provider]  aspirin  EC 81 MG tablet Take 1 tablet (81 mg total) by mouth daily. Swallow whole. Patient not taking: Reported  on 05/30/2024 02/24/24   Laurence Locus, DO  Budeson-Glycopyrrol-Formoterol  (BREZTRI  AEROSPHERE) 160-9-4.8 MCG/ACT AERO Inhale 2 puffs into the lungs in the morning and at bedtime. Patient not taking: Reported on 07/01/2024 03/16/23   Sood, Vineet, MD  clopidogrel  (PLAVIX ) 75 MG tablet Take 75 mg by mouth daily. 03/14/24   [provider]  docusate sodium  (COLACE) 100 MG capsule Take 100 mg by mouth daily as needed for mild constipation.    [provider]  DULoxetine  (CYMBALTA ) 30 MG capsule Take 30 mg by mouth daily. 05/10/23   [provider]  furosemide  (LASIX ) 20 MG tablet Take 1 tablet (20 mg total) by mouth 2 (two) times daily. For fluid 11/24/22   Pearlean Manus, MD  gabapentin  (NEURONTIN ) 100 MG capsule Take 100 mg by mouth 3 (three) times daily. 03/04/23   [provider]  guaiFENesin -dextromethorphan  (ROBITUSSIN DM) 100-10 MG/5ML syrup Take 5 mLs by mouth every 4 (four) hours as needed for cough. 07/01/24   Maree, Pratik D, DO  isosorbide  mononitrate (IMDUR ) 120 MG 24 hr tablet Take 1 tablet (120 mg total) by mouth every morning. 11/24/22   Pearlean Manus, MD  levothyroxine  (SYNTHROID ) 75 MCG tablet Take 75 mcg by mouth daily. 02/23/22   [provider]  memantine  (NAMENDA ) 10 MG tablet Take 10 mg by mouth 2 (two) times daily.  03/05/16   [provider]  nitroGLYCERIN  (NITROSTAT ) 0.4  MG SL tablet Place 1 tablet (0.4 mg total) under the tongue every 5 (five) minutes as needed for chest pain. 07/08/24   Cleotilde Rogue, MD  oxycodone  (ROXICODONE ) 30 MG immediate release tablet Take 150 mg by mouth daily. 06/04/24   [provider]  pantoprazole  (PROTONIX ) 40 MG tablet Take 1 tablet (40 mg total) by mouth daily. 07/01/24 07/01/25  Maree, Pratik D, DO  potassium chloride  SA (KLOR-CON  M) 20 MEQ tablet Take 20 mEq by mouth daily. 06/19/24   [provider]  QUEtiapine  (SEROQUEL ) 25 MG tablet Take 25 mg by mouth at bedtime as needed. 03/14/24    [provider]  ranolazine  (RANEXA ) 500 MG 12 hr tablet Take 500 mg by mouth 2 (two) times daily. 07/06/23   [provider]  risperiDONE  (RISPERDAL ) 0.25 MG tablet Take 0.25 mg by mouth at bedtime. 03/18/23   [provider]  rosuvastatin  (CRESTOR ) 40 MG tablet Take 1 tablet (40 mg total) by mouth daily at 6 PM. 02/24/24 07/01/24  Laurence Locus, DO    Allergies: Patient has no known allergies.    Review of Systems  Gastrointestinal:  Positive for constipation.  All other systems reviewed and are negative.   Updated Vital Signs BP (!) 177/73   Pulse 62   Temp 97.7 F (36.5 C) (Oral)   Resp 17   Ht 1.778 m (5' 10)   Wt 93 kg   SpO2 92%   BMI 29.41 kg/m   Physical Exam Vitals and nursing note reviewed.  Constitutional:      General: He is not in acute distress.    Appearance: He is well-developed.  HENT:     Head: Normocephalic and atraumatic.     Mouth/Throat:     Pharynx: No oropharyngeal exudate.  Eyes:     General: No scleral icterus.       Right eye: No discharge.        Left eye: No discharge.     Conjunctiva/sclera: Conjunctivae normal.     Pupils: Pupils are equal, round, and reactive to light.  Neck:     Thyroid : No thyromegaly.     Vascular: No JVD.  Cardiovascular:     Rate and Rhythm: Normal rate and regular rhythm.     Heart sounds: Normal heart sounds. No murmur heard.    No friction rub. No gallop.  Pulmonary:     Effort: Pulmonary effort is normal. No respiratory distress.     Breath sounds: Normal breath sounds. No wheezing or rales.  Abdominal:     General: There is distension.     Palpations: Abdomen is soft. There is no mass.     Tenderness: There is no abdominal tenderness.     Comments: No abdominal tenderness to palpation, decreased bowel sounds  Musculoskeletal:        General: No tenderness. Normal range of motion.     Cervical back: Normal range of motion and neck supple.     Right lower leg: No edema.     Left  lower leg: No edema.  Lymphadenopathy:     Cervical: No cervical adenopathy.  Skin:    General: Skin is warm and dry.     Findings: No erythema or rash.  Neurological:     Mental Status: He is alert.     Coordination: Coordination normal.  Psychiatric:        Behavior: Behavior normal.     (all labs ordered are listed, but only abnormal results are  displayed) Labs Reviewed - No data to display  EKG: None  Radiology: No results found.   Procedures   Medications Ordered in the ED - No data to display                                  Medical Decision Making Amount and/or Complexity of Data Reviewed Radiology: ordered.  Risk Prescription drug management.    This patient presents to the ED for concern of constipation differential diagnosis includes opiate related constipation, will obtain acute abdominal series to evaluate for stool burden, needs rectal exam to make sure he is not impacted, he does not appear ill otherwise and has normal vital signs, may benefit from antiopiate constipation medication    Additional history obtained   Additional history obtained from Electronic Medical Record External records from outside source obtained and reviewed including medical record including recent endoscopy, he had a food impaction about a week ago which was successfully removed by endoscopy, he did not tell his gastroenterologist about his constipation at that time   Imaging Studies ordered:  I ordered imaging studies including constipation, x-ray of the abdomen heavy stool burden I independently visualized and interpreted imaging which showed heavy stool burden I agree with the radiologist interpretation   Medicines ordered and prescription drug management:  I ordered medication including outpatient medications including Linzess     I have reviewed the patients home medicines and have made adjustments as needed   Problem List / ED Course:  With the nurse present  I performed a rectal exam, the patient had a stool in the rectal vault which was too high to disimpact, no hemorrhoids no fissures no masses   Social Determinants of Health:  Constipation, chronic opiate use  The patient is agreeable to the plan of Linzess  with other stool softeners and laxatives, can follow-up with GI        Final diagnoses:  Drug-induced constipation    ED Discharge Orders          Ordered    nitroGLYCERIN  (NITROSTAT ) 0.4 MG SL tablet  Every 5 min PRN        07/08/24 1634    linaclotide  (LINZESS ) 145 MCG CAPS capsule  Daily before breakfast        07/08/24 1635               Cleotilde Rogue, MD 07/08/24 [phone]

## 2024-08-03 DIAGNOSIS — I700 Atherosclerosis of aorta: Secondary | ICD-10-CM | POA: Diagnosis not present

## 2024-08-03 DIAGNOSIS — G894 Chronic pain syndrome: Secondary | ICD-10-CM | POA: Diagnosis not present

## 2024-08-03 DIAGNOSIS — M48061 Spinal stenosis, lumbar region without neurogenic claudication: Secondary | ICD-10-CM | POA: Diagnosis not present

## 2024-08-03 DIAGNOSIS — I5032 Chronic diastolic (congestive) heart failure: Secondary | ICD-10-CM | POA: Diagnosis not present

## 2024-08-03 DIAGNOSIS — F0280 Dementia in other diseases classified elsewhere without behavioral disturbance: Secondary | ICD-10-CM | POA: Diagnosis not present

## 2024-08-03 DIAGNOSIS — J449 Chronic obstructive pulmonary disease, unspecified: Secondary | ICD-10-CM | POA: Diagnosis not present

## 2024-08-03 DIAGNOSIS — M4302 Spondylolysis, cervical region: Secondary | ICD-10-CM | POA: Diagnosis not present

## 2024-08-03 DIAGNOSIS — M5414 Radiculopathy, thoracic region: Secondary | ICD-10-CM | POA: Diagnosis not present

## 2024-08-03 DIAGNOSIS — F1120 Opioid dependence, uncomplicated: Secondary | ICD-10-CM | POA: Diagnosis not present

## 2024-08-08 ENCOUNTER — Telehealth: Payer: Self-pay

## 2024-08-08 NOTE — Telephone Encounter (Signed)
 Patient called in stating that he has been having pain to left shoulder blade and 9/10 pain to chest when taking deep breaths. Patient stated that he has been taking Tylenol  with no relief.+SOB, +coughing up light green phlegm - (-)hemoptysis, (- )swallowing/choking concern. Patient was encouraged to go to the ED but wanted some advice from Dr. Shannon.

## 2024-08-10 ENCOUNTER — Ambulatory Visit (HOSPITAL_COMMUNITY): Admission: RE | Admit: 2024-08-10 | Source: Ambulatory Visit

## 2024-08-12 NOTE — Progress Notes (Deleted)
 GI Office Note    Referring Provider: Bertell Satterfield, MD Primary Care Physician:  Bertell Satterfield, MD  Primary Gastroenterologist: Ozell Hollingshead, MD   Chief Complaint   No chief complaint on file.   History of Present Illness   Dustin [name] is a 89 y.o. male presenting today for hospital follow-up.  He was seen on June 30, 2024 after presenting with esophageal food impaction.  He has hide prior remote emergent EGD for food impaction in the past with normal-appearing esophagus, biopsies were not taken at that time.  CT chest showed moderate fluid distention of the esophagus to the level of GE junction.  Suspected impacted food bolus at the GE junction measuring 4.2 x 2.4 cm.  Concern for possible evolving postradiation/therapy changes involving the left posterior upper lobe and superior left lower lobe.  Multifocal infection/inflammation or possible aspiration involving the right middle lobe and bilateral lower lobes.  Acute appearing left sixth posterior rib fracture.       Prior Data   EGD June 30, 2024: - Bulky food impaction removed, incomplete examination per plan status post esophageal biopsy (normal) - Based on the appearance patient may have underlying esophageal motility disorder - Recommend elective EGD with esophageal dilation   CT abdomen pelvis with contrast May 2025: IMPRESSION: 1. Moderate volume fecal loading throughout the colon with a prominent stool ball in the rectum, measuring 8.7 x 7.5 cm, consistent with constipation and possible impaction. Moderate presacral stranding with inflammation extending into the sigmoid mesocolon, worrisome for superimposed stercoral colitis. 2. Extensive descending and sigmoid colonic diverticulosis. While there is inflammation in the sigmoid mesocolon, no discrete or focally inflamed diverticulum is present to suggest acute diverticulitis. 3. Small, 6 mm dependent nodule in the gallbladder wall (coronal 67),  possibly a small gallbladder polyp. 4. Well-positioned urinary catheter.        Medications   Current Outpatient Medications  Medication Sig Dispense Refill   albuterol  (PROVENTIL ) (2.5 MG/3ML) 0.083% nebulizer solution Take 3 mLs (2.5 mg total) by nebulization every 2 (two) hours as needed for wheezing or shortness of breath. 75 mL 3   albuterol  (VENTOLIN  HFA) 108 (90 Base) MCG/ACT inhaler Inhale 2 puffs into the lungs every 6 (six) hours as needed for wheezing or shortness of breath. 18 g 3   alfuzosin  (UROXATRAL ) 10 MG 24 hr tablet Take 10 mg by mouth every morning.     aspirin  EC 81 MG tablet Take 1 tablet (81 mg total) by mouth daily. Swallow whole. (Patient not taking: Reported on 05/30/2024)     Budeson-Glycopyrrol-Formoterol  (BREZTRI  AEROSPHERE) 160-9-4.8 MCG/ACT AERO Inhale 2 puffs into the lungs in the morning and at bedtime. (Patient not taking: Reported on 07/01/2024) 10.7 g 5   clopidogrel  (PLAVIX ) 75 MG tablet Take 75 mg by mouth daily.     docusate sodium  (COLACE) 100 MG capsule Take 100 mg by mouth daily as needed for mild constipation.     DULoxetine  (CYMBALTA ) 30 MG capsule Take 30 mg by mouth daily.     furosemide  (LASIX ) 20 MG tablet Take 1 tablet (20 mg total) by mouth 2 (two) times daily. For fluid 60 tablet 5   gabapentin  (NEURONTIN ) 100 MG capsule Take 100 mg by mouth 3 (three) times daily.     guaiFENesin -dextromethorphan  (ROBITUSSIN DM) 100-10 MG/5ML syrup Take 5 mLs by mouth every 4 (four) hours as needed for cough. 118 mL 0   isosorbide  mononitrate (IMDUR ) 120 MG 24 hr tablet Take 1 tablet (  120 mg total) by mouth every morning. 30 tablet 11   levothyroxine  (SYNTHROID ) 75 MCG tablet Take 75 mcg by mouth daily.     linaclotide  (LINZESS ) 145 MCG CAPS capsule Take 1 capsule (145 mcg total) by mouth daily before breakfast. 30 capsule 3   memantine  (NAMENDA ) 10 MG tablet Take 10 mg by mouth 2 (two) times daily.      nitroGLYCERIN  (NITROSTAT ) 0.4 MG SL tablet Place 1 tablet  (0.4 mg total) under the tongue every 5 (five) minutes as needed for chest pain. 15 tablet 5   oxycodone  (ROXICODONE ) 30 MG immediate release tablet Take 150 mg by mouth daily.     pantoprazole  (PROTONIX ) 40 MG tablet Take 1 tablet (40 mg total) by mouth daily. 30 tablet 1   potassium chloride  SA (KLOR-CON  M) 20 MEQ tablet Take 20 mEq by mouth daily.     QUEtiapine  (SEROQUEL ) 25 MG tablet Take 25 mg by mouth at bedtime as needed.     ranolazine  (RANEXA ) 500 MG 12 hr tablet Take 500 mg by mouth 2 (two) times daily.     risperiDONE  (RISPERDAL ) 0.25 MG tablet Take 0.25 mg by mouth at bedtime.     rosuvastatin  (CRESTOR ) 40 MG tablet Take 1 tablet (40 mg total) by mouth daily at 6 PM. 30 tablet 5   No current facility-administered medications for this visit.    Allergies   Allergies as of 08/13/2024   (No Known Allergies)     Past Medical History   Past Medical History:  Diagnosis Date   Alzheimer disease (HCC)    Anxiety    Arthritis    Atrophic gastritis    a. By EGD 02/2013.   Carotid artery disease (HCC)    a. mild-mod plaque, <50% stenosis bilat by duplex 2018.   Coronary atherosclerosis of native coronary artery    a. Multivessel s/p CABG 1996. b. DESx2 to SVG-PDA in 2012 c. DES to distal LM in 11/2019 d. cath in 02/2021 showing patent LM stent, patent LIMA-LAD, patent SVG-OM1 with chronically occluded jump limb to OM2 and chronic occlusion of SVG-D2 and SVG-PDA and no targets for intervention   DDD (degenerative disc disease)    Chronic back pain   Dementia (HCC)    Enlarged prostate    Essential hypertension    Hematuria    History of radiation therapy    Left Lung- 04/26/23-Dr. Lynwood Nasuti   Hypothyroidism    LBBB (left bundle branch block)    MI (myocardial infarction) (HCC)    Mixed hyperlipidemia    OA (osteoarthritis)    OSA (obstructive sleep apnea)    Pneumonia due to COVID-19 virus    February 2021   Sinus bradycardia    a. Aricept  and BB discontinued due to  this.    Past Surgical History   Past Surgical History:  Procedure Laterality Date   CATARACT EXTRACTION W/PHACO Left 03/22/2022   Procedure: CATARACT EXTRACTION PHACO AND INTRAOCULAR LENS PLACEMENT (IOC);  Surgeon: Harrie Lynwood, MD;  Location: [facility];  Service: Ophthalmology;  Laterality: Left;  CDE: 25.22   COLONOSCOPY  08/03/2004   Jenkins-numerous large diverticula in the descending, transverse, descending, and sigmoid colon. Otherwise normal exam.   COLONOSCOPY  07/12/2012   RMR: External hemorrhoidal tag; multiple rectal and colonic polyps removed and/or treated as described above. Pancolonic diverticulosis. Bx-tubular adenomas, rectal hyperplastic polyp. next colonoscopy in 06/2015.   COLONOSCOPY N/A 06/22/2016   Procedure: COLONOSCOPY;  Surgeon: Oneil Budge, MD;  Location: [facility];  Service: Gastroenterology;  Laterality: N/A;  730   CORONARY ANGIOPLASTY WITH STENT PLACEMENT     CORONARY ARTERY BYPASS GRAFT  1996   LIMA to LAD, SVG to D2, SVG to PDA, SVG to OM1 and OM2   CORONARY STENT INTERVENTION N/A 12/10/2019   Procedure: CORONARY STENT INTERVENTION;  Surgeon: Verlin Lonni BIRCH, MD;  Location: [facility];  Service: Cardiovascular;  Laterality: N/A;   CORONARY ULTRASOUND/IVUS N/A 12/10/2019   Procedure: Intravascular Ultrasound/IVUS;  Surgeon: Verlin Lonni BIRCH, MD;  Location: [facility];  Service: Cardiovascular;  Laterality: N/A;   ESOPHAGOGASTRODUODENOSCOPY N/A 03/16/2013   Procedure: ESOPHAGOGASTRODUODENOSCOPY (EGD);  Surgeon: Lamar CHRISTELLA Hollingshead, MD;  Location: [facility];  Service: Endoscopy;  Laterality: N/A;  12:00-moved to 1030 Leigh Ann notified pt   ESOPHAGOGASTRODUODENOSCOPY N/A 03/06/2013   Procedure: ESOPHAGOGASTRODUODENOSCOPY (EGD);  Surgeon: Lamar CHRISTELLA Hollingshead, MD;  Location: [facility];  Service: Endoscopy;  Laterality: N/A;   ESOPHAGOGASTRODUODENOSCOPY N/A 06/30/2024   Procedure: EGD (ESOPHAGOGASTRODUODENOSCOPY);  Surgeon: Hollingshead Lamar CHRISTELLA, MD;  Location: [facility];  Service: Endoscopy;  Laterality: N/A;   HERNIA REPAIR     LEFT HEART CATH AND CORS/GRAFTS ANGIOGRAPHY N/A 12/10/2019   Procedure: LEFT HEART CATH AND CORS/GRAFTS ANGIOGRAPHY;  Surgeon: Verlin Lonni BIRCH, MD;  Location: [facility];  Service: Cardiovascular;  Laterality: N/A;   LEFT HEART CATH AND CORS/GRAFTS ANGIOGRAPHY N/A 03/02/2021   Procedure: LEFT HEART CATH AND CORS/GRAFTS ANGIOGRAPHY;  Surgeon: Mady Lonni, MD;  Location: [facility];  Service: Cardiovascular;  Laterality: N/A;    Past Family History   Family History  Problem Relation Age of Onset   Heart disease Other    Heart attack Mother    Colon cancer Neg Hx     Past Social History   Social History   Socioeconomic History   Marital status: Married    Spouse name: Not on file   Number of children: 3   Years of education: Not on file   Highest education level: Not on file  Occupational History   Occupation: retired    Associate Professor: RETIRED  Tobacco Use   Smoking status: Former    Current packs/day: 0.00    Average packs/day: 2.0 packs/day for 40.0 years (80.0 ttl pk-yrs)    Types: Cigarettes    Start date: 12/27/1954    Quit date: 12/27/1994    Years since quitting: 29.6   Smokeless tobacco: Never  Vaping Use   Vaping status: Never Used  Substance and Sexual Activity   Alcohol use: No    Alcohol/week: 0.0 standard drinks of alcohol   Drug use: No   Sexual activity: Not Currently  Other Topics Concern   Not on file  Social History Narrative   Lives w/ ailing wife.   Son brings meals 3x per week   Social Drivers of Health   Financial Resource Strain: Not on file  Food Insecurity: No Food Insecurity (07/01/2024)   Hunger Vital Sign    Worried About Running Out of Food in the Last Year: Never true    Ran Out of Food in the Last Year: Never true  Transportation Needs: No Transportation Needs (07/01/2024)   PRAPARE - Administrator, Civil Service  (Medical): No    Lack of Transportation (Non-Medical): No  Physical Activity: Not on file  Stress: Not on file  Social Connections: Moderately Isolated (07/01/2024)   Social Connection and Isolation Panel    Frequency  of Communication with Friends and Family: More than three times a week    Frequency of Social Gatherings with Friends and Family: Twice a week    Attends Religious Services: Never    Database administrator or Organizations: No    Attends Banker Meetings: Never    Marital Status: Married  Catering manager Violence: Not At Risk (07/01/2024)   Humiliation, Afraid, Rape, and Kick questionnaire    Fear of Current or Ex-Partner: No    Emotionally Abused: No    Physically Abused: No    Sexually Abused: No    Review of Systems   General: Negative for anorexia, weight loss, fever, chills, fatigue, weakness. ENT: Negative for hoarseness, difficulty swallowing , nasal congestion. CV: Negative for chest pain, angina, palpitations, dyspnea on exertion, peripheral edema.  Respiratory: Negative for dyspnea at rest, dyspnea on exertion, cough, sputum, wheezing.  GI: See history of present illness. GU:  Negative for dysuria, hematuria, urinary incontinence, urinary frequency, nocturnal urination.  Endo: Negative for unusual weight change.     Physical Exam   There were no vitals taken for this visit.   General: Well-nourished, well-developed in no acute distress.  Eyes: No icterus. Mouth: Oropharyngeal mucosa moist and pink   Lungs: Clear to auscultation bilaterally.  Heart: Regular rate and rhythm, no murmurs rubs or gallops.  Abdomen: Bowel sounds are normal, nontender, nondistended, no hepatosplenomegaly or masses,  no abdominal bruits or hernia , no rebound or guarding.  Rectal: not performed Extremities: No lower extremity edema. No clubbing or deformities. Neuro: Alert and oriented x 4   Skin: Warm and dry, no jaundice.   Psych: Alert and cooperative, normal  mood and affect.  Labs   Lab Results  Component Value Date   NA 139 07/01/2024   CL 104 07/01/2024   K 3.8 07/01/2024   CO2 27 07/01/2024   BUN 15 07/01/2024   CREATININE 1.01 07/01/2024   GFRNONAA >60 07/01/2024   CALCIUM  8.7 (L) 07/01/2024   PHOS 2.6 02/12/2021   ALBUMIN 3.7 06/30/2024   GLUCOSE 103 (H) 07/01/2024   Lab Results  Component Value Date   WBC 13.2 (H) 07/01/2024   HGB 11.3 (L) 07/01/2024   HCT 35.2 (L) 07/01/2024   MCV 96.7 07/01/2024   PLT 187 07/01/2024   Lab Results  Component Value Date   ALT 12 06/30/2024   AST 14 (L) 06/30/2024   ALKPHOS 54 06/30/2024   BILITOT 1.1 06/30/2024    Imaging Studies   No results found.  Assessment/Plan:           Sonny RAMAN. Ezzard, MHS, PA-C [facility]

## 2024-08-13 ENCOUNTER — Telehealth: Payer: Self-pay | Admitting: Gastroenterology

## 2024-08-13 ENCOUNTER — Ambulatory Visit: Admitting: Gastroenterology

## 2024-08-13 NOTE — Telephone Encounter (Signed)
 I note that patient cancelled appointment for today.   He was due for ov because he had emergent EGD to fish out food that was stuck in his esophagus. Per EGD report, he spoke to his wife Blima in the waiting room after the procedure letting her know he would need another EGD in few weeks to actually stretch his esophagus.  I would recommend he make appt for follow up so we can schedule his EGD.

## 2024-08-14 NOTE — Telephone Encounter (Signed)
 Pt didn't cancel, an appt was made for him and then a letter was mailed out with the appt information on it. I cancelled it after the pt didn't show up, because I didn't feel as though it would be fair for him to acquire a no show without being the one to make the appt. I will try to reach out to them about rescheduling.

## 2024-08-14 NOTE — Telephone Encounter (Signed)
 No ans, vm full

## 2024-08-15 NOTE — Telephone Encounter (Signed)
 Ok. Result note from 7/5 said he was aware of ov 8/18. Either way as long as he comes back in.

## 2024-08-15 NOTE — Telephone Encounter (Signed)
 Spoke with pt's son Arley (DPR on file) and rescheduled f/u appt for Fri 8/22.

## 2024-08-17 ENCOUNTER — Ambulatory Visit: Admitting: Gastroenterology

## 2024-08-21 ENCOUNTER — Ambulatory Visit (HOSPITAL_COMMUNITY)
Admission: RE | Admit: 2024-08-21 | Discharge: 2024-08-21 | Disposition: A | Discharge status: Home / Self Care | Source: Ambulatory Visit | Attending: Medical

## 2024-08-21 DIAGNOSIS — Z951 Presence of aortocoronary bypass graft: Secondary | ICD-10-CM | POA: Diagnosis not present

## 2024-08-21 DIAGNOSIS — E782 Mixed hyperlipidemia: Secondary | ICD-10-CM | POA: Diagnosis present

## 2024-08-21 DIAGNOSIS — F0390 Unspecified dementia without behavioral disturbance: Secondary | ICD-10-CM | POA: Diagnosis not present

## 2024-08-21 DIAGNOSIS — K8020 Calculus of gallbladder without cholecystitis without obstruction: Secondary | ICD-10-CM | POA: Diagnosis not present

## 2024-08-21 DIAGNOSIS — K5730 Diverticulosis of large intestine without perforation or abscess without bleeding: Secondary | ICD-10-CM | POA: Diagnosis not present

## 2024-08-21 DIAGNOSIS — R011 Cardiac murmur, unspecified: Secondary | ICD-10-CM | POA: Diagnosis not present

## 2024-08-21 DIAGNOSIS — G4733 Obstructive sleep apnea (adult) (pediatric): Secondary | ICD-10-CM | POA: Diagnosis present

## 2024-08-21 DIAGNOSIS — I110 Hypertensive heart disease with heart failure: Secondary | ICD-10-CM | POA: Diagnosis present

## 2024-08-21 DIAGNOSIS — S2232XA Fracture of one rib, left side, initial encounter for closed fracture: Secondary | ICD-10-CM | POA: Diagnosis present

## 2024-08-21 DIAGNOSIS — M4854XA Collapsed vertebra, not elsewhere classified, thoracic region, initial encounter for fracture: Secondary | ICD-10-CM | POA: Diagnosis present

## 2024-08-21 DIAGNOSIS — N400 Enlarged prostate without lower urinary tract symptoms: Secondary | ICD-10-CM | POA: Diagnosis present

## 2024-08-21 DIAGNOSIS — Y842 Radiological procedure and radiotherapy as the cause of abnormal reaction of the patient, or of later complication, without mention of misadventure at the time of the procedure: Secondary | ICD-10-CM | POA: Diagnosis present

## 2024-08-21 DIAGNOSIS — J690 Pneumonitis due to inhalation of food and vomit: Secondary | ICD-10-CM | POA: Diagnosis present

## 2024-08-21 DIAGNOSIS — F0280 Dementia in other diseases classified elsewhere without behavioral disturbance: Secondary | ICD-10-CM | POA: Diagnosis present

## 2024-08-21 DIAGNOSIS — I2510 Atherosclerotic heart disease of native coronary artery without angina pectoris: Secondary | ICD-10-CM | POA: Diagnosis present

## 2024-08-21 DIAGNOSIS — E039 Hypothyroidism, unspecified: Secondary | ICD-10-CM | POA: Diagnosis present

## 2024-08-21 DIAGNOSIS — I5032 Chronic diastolic (congestive) heart failure: Secondary | ICD-10-CM | POA: Diagnosis present

## 2024-08-21 DIAGNOSIS — Z8616 Personal history of COVID-19: Secondary | ICD-10-CM | POA: Diagnosis not present

## 2024-08-21 DIAGNOSIS — I447 Left bundle-branch block, unspecified: Secondary | ICD-10-CM | POA: Diagnosis present

## 2024-08-21 DIAGNOSIS — R519 Headache, unspecified: Secondary | ICD-10-CM | POA: Diagnosis not present

## 2024-08-21 DIAGNOSIS — D649 Anemia, unspecified: Secondary | ICD-10-CM | POA: Diagnosis present

## 2024-08-21 DIAGNOSIS — R0602 Shortness of breath: Secondary | ICD-10-CM | POA: Diagnosis present

## 2024-08-21 DIAGNOSIS — N281 Cyst of kidney, acquired: Secondary | ICD-10-CM | POA: Diagnosis not present

## 2024-08-21 DIAGNOSIS — W19XXXA Unspecified fall, initial encounter: Secondary | ICD-10-CM | POA: Diagnosis present

## 2024-08-21 DIAGNOSIS — J189 Pneumonia, unspecified organism: Secondary | ICD-10-CM | POA: Diagnosis not present

## 2024-08-21 DIAGNOSIS — R918 Other nonspecific abnormal finding of lung field: Secondary | ICD-10-CM | POA: Diagnosis not present

## 2024-08-21 DIAGNOSIS — J9811 Atelectasis: Secondary | ICD-10-CM | POA: Diagnosis not present

## 2024-08-21 DIAGNOSIS — E66811 Obesity, class 1: Secondary | ICD-10-CM | POA: Diagnosis present

## 2024-08-21 DIAGNOSIS — E876 Hypokalemia: Secondary | ICD-10-CM | POA: Diagnosis present

## 2024-08-21 DIAGNOSIS — F32A Depression, unspecified: Secondary | ICD-10-CM | POA: Diagnosis present

## 2024-08-21 DIAGNOSIS — J90 Pleural effusion, not elsewhere classified: Secondary | ICD-10-CM | POA: Diagnosis not present

## 2024-08-21 DIAGNOSIS — S22089A Unspecified fracture of T11-T12 vertebra, initial encounter for closed fracture: Secondary | ICD-10-CM | POA: Diagnosis not present

## 2024-08-21 DIAGNOSIS — N3001 Acute cystitis with hematuria: Secondary | ICD-10-CM | POA: Diagnosis not present

## 2024-08-21 DIAGNOSIS — I252 Old myocardial infarction: Secondary | ICD-10-CM | POA: Diagnosis not present

## 2024-08-21 DIAGNOSIS — N390 Urinary tract infection, site not specified: Secondary | ICD-10-CM | POA: Diagnosis present

## 2024-08-21 DIAGNOSIS — R059 Cough, unspecified: Secondary | ICD-10-CM | POA: Diagnosis not present

## 2024-08-21 DIAGNOSIS — R001 Bradycardia, unspecified: Secondary | ICD-10-CM | POA: Diagnosis present

## 2024-08-21 DIAGNOSIS — J9611 Chronic respiratory failure with hypoxia: Secondary | ICD-10-CM | POA: Diagnosis present

## 2024-08-21 DIAGNOSIS — G894 Chronic pain syndrome: Secondary | ICD-10-CM | POA: Diagnosis present

## 2024-08-21 DIAGNOSIS — G309 Alzheimer's disease, unspecified: Secondary | ICD-10-CM | POA: Diagnosis present

## 2024-08-21 DIAGNOSIS — I25810 Atherosclerosis of coronary artery bypass graft(s) without angina pectoris: Secondary | ICD-10-CM | POA: Diagnosis present

## 2024-08-21 DIAGNOSIS — R109 Unspecified abdominal pain: Secondary | ICD-10-CM | POA: Diagnosis not present

## 2024-08-21 LAB — ECHOCARDIOGRAM COMPLETE
AR max vel: 1.75 cm2
AV Area VTI: 1.79 cm2
AV Area mean vel: 1.71 cm2
AV Mean grad: 7 mmHg
AV Peak grad: 13.3 mmHg
Ao pk vel: 1.83 m/s
Area-P 1/2: 2.55 cm2
Calc EF: 56.7 %
S' Lateral: 3.4 cm
Single Plane A2C EF: 54.4 %
Single Plane A4C EF: 59.7 %

## 2024-08-24 ENCOUNTER — Other Ambulatory Visit: Payer: Self-pay

## 2024-08-24 ENCOUNTER — Emergency Department (HOSPITAL_COMMUNITY)

## 2024-08-24 ENCOUNTER — Encounter (HOSPITAL_COMMUNITY): Payer: Self-pay | Admitting: *Deleted

## 2024-08-24 ENCOUNTER — Inpatient Hospital Stay (HOSPITAL_COMMUNITY)
Admission: EM | Admit: 2024-08-24 | Discharge: 2024-08-26 | DRG: 178 | Disposition: A | Discharge status: Home Health | Source: Ambulatory Visit | Attending: Internal Medicine | Admitting: Internal Medicine

## 2024-08-24 DIAGNOSIS — I252 Old myocardial infarction: Secondary | ICD-10-CM | POA: Diagnosis not present

## 2024-08-24 DIAGNOSIS — N390 Urinary tract infection, site not specified: Secondary | ICD-10-CM | POA: Diagnosis present

## 2024-08-24 DIAGNOSIS — Z79890 Hormone replacement therapy (postmenopausal): Secondary | ICD-10-CM

## 2024-08-24 DIAGNOSIS — I5032 Chronic diastolic (congestive) heart failure: Secondary | ICD-10-CM | POA: Diagnosis present

## 2024-08-24 DIAGNOSIS — R001 Bradycardia, unspecified: Secondary | ICD-10-CM | POA: Diagnosis present

## 2024-08-24 DIAGNOSIS — D649 Anemia, unspecified: Secondary | ICD-10-CM | POA: Diagnosis present

## 2024-08-24 DIAGNOSIS — Z8616 Personal history of COVID-19: Secondary | ICD-10-CM

## 2024-08-24 DIAGNOSIS — F32A Depression, unspecified: Secondary | ICD-10-CM | POA: Diagnosis present

## 2024-08-24 DIAGNOSIS — E039 Hypothyroidism, unspecified: Secondary | ICD-10-CM | POA: Diagnosis present

## 2024-08-24 DIAGNOSIS — I2582 Chronic total occlusion of coronary artery: Secondary | ICD-10-CM | POA: Diagnosis present

## 2024-08-24 DIAGNOSIS — E876 Hypokalemia: Secondary | ICD-10-CM | POA: Diagnosis present

## 2024-08-24 DIAGNOSIS — N400 Enlarged prostate without lower urinary tract symptoms: Secondary | ICD-10-CM | POA: Diagnosis present

## 2024-08-24 DIAGNOSIS — M549 Dorsalgia, unspecified: Secondary | ICD-10-CM | POA: Diagnosis present

## 2024-08-24 DIAGNOSIS — Z79899 Other long term (current) drug therapy: Secondary | ICD-10-CM

## 2024-08-24 DIAGNOSIS — R0602 Shortness of breath: Secondary | ICD-10-CM | POA: Diagnosis present

## 2024-08-24 DIAGNOSIS — Z8249 Family history of ischemic heart disease and other diseases of the circulatory system: Secondary | ICD-10-CM

## 2024-08-24 DIAGNOSIS — I25810 Atherosclerosis of coronary artery bypass graft(s) without angina pectoris: Secondary | ICD-10-CM | POA: Diagnosis present

## 2024-08-24 DIAGNOSIS — I447 Left bundle-branch block, unspecified: Secondary | ICD-10-CM | POA: Diagnosis present

## 2024-08-24 DIAGNOSIS — S2232XA Fracture of one rib, left side, initial encounter for closed fracture: Secondary | ICD-10-CM | POA: Diagnosis present

## 2024-08-24 DIAGNOSIS — M4854XA Collapsed vertebra, not elsewhere classified, thoracic region, initial encounter for fracture: Secondary | ICD-10-CM | POA: Diagnosis present

## 2024-08-24 DIAGNOSIS — F0280 Dementia in other diseases classified elsewhere without behavioral disturbance: Secondary | ICD-10-CM | POA: Diagnosis present

## 2024-08-24 DIAGNOSIS — Z8701 Personal history of pneumonia (recurrent): Secondary | ICD-10-CM

## 2024-08-24 DIAGNOSIS — S22089A Unspecified fracture of T11-T12 vertebra, initial encounter for closed fracture: Secondary | ICD-10-CM | POA: Diagnosis not present

## 2024-08-24 DIAGNOSIS — J690 Pneumonitis due to inhalation of food and vomit: Principal | ICD-10-CM | POA: Diagnosis present

## 2024-08-24 DIAGNOSIS — Z7982 Long term (current) use of aspirin: Secondary | ICD-10-CM

## 2024-08-24 DIAGNOSIS — G894 Chronic pain syndrome: Secondary | ICD-10-CM | POA: Diagnosis present

## 2024-08-24 DIAGNOSIS — R531 Weakness: Secondary | ICD-10-CM

## 2024-08-24 DIAGNOSIS — E66811 Obesity, class 1: Secondary | ICD-10-CM | POA: Diagnosis present

## 2024-08-24 DIAGNOSIS — Z6829 Body mass index (BMI) 29.0-29.9, adult: Secondary | ICD-10-CM

## 2024-08-24 DIAGNOSIS — W19XXXA Unspecified fall, initial encounter: Secondary | ICD-10-CM | POA: Diagnosis present

## 2024-08-24 DIAGNOSIS — G309 Alzheimer's disease, unspecified: Secondary | ICD-10-CM | POA: Diagnosis present

## 2024-08-24 DIAGNOSIS — J9611 Chronic respiratory failure with hypoxia: Secondary | ICD-10-CM | POA: Diagnosis present

## 2024-08-24 DIAGNOSIS — G4733 Obstructive sleep apnea (adult) (pediatric): Secondary | ICD-10-CM | POA: Diagnosis present

## 2024-08-24 DIAGNOSIS — J189 Pneumonia, unspecified organism: Secondary | ICD-10-CM | POA: Diagnosis not present

## 2024-08-24 DIAGNOSIS — F0390 Unspecified dementia without behavioral disturbance: Secondary | ICD-10-CM | POA: Diagnosis not present

## 2024-08-24 DIAGNOSIS — Y842 Radiological procedure and radiotherapy as the cause of abnormal reaction of the patient, or of later complication, without mention of misadventure at the time of the procedure: Secondary | ICD-10-CM | POA: Diagnosis present

## 2024-08-24 DIAGNOSIS — N3001 Acute cystitis with hematuria: Secondary | ICD-10-CM | POA: Diagnosis not present

## 2024-08-24 DIAGNOSIS — R2681 Unsteadiness on feet: Secondary | ICD-10-CM | POA: Diagnosis present

## 2024-08-24 DIAGNOSIS — Z7902 Long term (current) use of antithrombotics/antiplatelets: Secondary | ICD-10-CM

## 2024-08-24 DIAGNOSIS — I2510 Atherosclerotic heart disease of native coronary artery without angina pectoris: Secondary | ICD-10-CM | POA: Diagnosis present

## 2024-08-24 DIAGNOSIS — I110 Hypertensive heart disease with heart failure: Secondary | ICD-10-CM | POA: Diagnosis present

## 2024-08-24 DIAGNOSIS — R112 Nausea with vomiting, unspecified: Secondary | ICD-10-CM | POA: Diagnosis present

## 2024-08-24 DIAGNOSIS — Z9981 Dependence on supplemental oxygen: Secondary | ICD-10-CM

## 2024-08-24 DIAGNOSIS — Z87891 Personal history of nicotine dependence: Secondary | ICD-10-CM

## 2024-08-24 DIAGNOSIS — E782 Mixed hyperlipidemia: Secondary | ICD-10-CM | POA: Diagnosis present

## 2024-08-24 DIAGNOSIS — Z955 Presence of coronary angioplasty implant and graft: Secondary | ICD-10-CM

## 2024-08-24 LAB — URINALYSIS, ROUTINE W REFLEX MICROSCOPIC
Bilirubin Urine: NEGATIVE
Glucose, UA: NEGATIVE mg/dL
Ketones, ur: NEGATIVE mg/dL
Nitrite: POSITIVE — AB
Protein, ur: 30 mg/dL — AB
Specific Gravity, Urine: 1.024 (ref 1.005–1.030)
WBC, UA: >50 WBC/hpf (ref 0–5)
pH: 5 (ref 5.0–8.0)

## 2024-08-24 LAB — BASIC METABOLIC PANEL WITH GFR
Anion gap: 14 (ref 5–15)
BUN: 16 mg/dL (ref 8–23)
CO2: 26 mmol/L (ref 22–32)
Calcium: 8.9 mg/dL (ref 8.9–10.3)
Chloride: 96 mmol/L — ABNORMAL LOW (ref 98–111)
Creatinine, Ser: 1.04 mg/dL (ref 0.61–1.24)
GFR, Estimated: >60 mL/min (ref >60)
Glucose, Bld: 134 mg/dL — ABNORMAL HIGH (ref 70–99)
Potassium: 3.4 mmol/L — ABNORMAL LOW (ref 3.5–5.1)
Sodium: 136 mmol/L (ref 135–145)

## 2024-08-24 LAB — HEPATIC FUNCTION PANEL
ALT: 10 U/L (ref 0–44)
AST: 16 U/L (ref 15–41)
Albumin: 3 g/dL — ABNORMAL LOW (ref 3.5–5.0)
Alkaline Phosphatase: 114 U/L (ref 38–126)
Bilirubin, Direct: 0.2 mg/dL (ref 0.0–0.2)
Indirect Bilirubin: 0.8 mg/dL (ref 0.3–0.9)
Total Bilirubin: 1 mg/dL (ref 0.0–1.2)
Total Protein: 6 g/dL — ABNORMAL LOW (ref 6.5–8.1)

## 2024-08-24 LAB — RESP PANEL BY RT-PCR (RSV, FLU A&B, COVID)  RVPGX2
Influenza A by PCR: NEGATIVE
Influenza B by PCR: NEGATIVE
Resp Syncytial Virus by PCR: NEGATIVE
SARS Coronavirus 2 by RT PCR: NEGATIVE

## 2024-08-24 LAB — CBC WITH DIFFERENTIAL/PLATELET
Abs Immature Granulocytes: 0.05 K/uL (ref 0.00–0.07)
Basophils Absolute: 0 K/uL (ref 0.0–0.1)
Basophils Relative: 0 %
Eosinophils Absolute: 0 K/uL (ref 0.0–0.5)
Eosinophils Relative: 0 %
HCT: 35.5 % — ABNORMAL LOW (ref 39.0–52.0)
Hemoglobin: 11.8 g/dL — ABNORMAL LOW (ref 13.0–17.0)
Immature Granulocytes: 0 %
Lymphocytes Relative: 8 %
Lymphs Abs: 1.3 K/uL (ref 0.7–4.0)
MCH: 31.6 pg (ref 26.0–34.0)
MCHC: 33.2 g/dL (ref 30.0–36.0)
MCV: 94.9 fL (ref 80.0–100.0)
Monocytes Absolute: 0.8 K/uL (ref 0.1–1.0)
Monocytes Relative: 5 %
Neutro Abs: 13.9 K/uL — ABNORMAL HIGH (ref 1.7–7.7)
Neutrophils Relative %: 87 %
Platelets: 229 K/uL (ref 150–400)
RBC: 3.74 MIL/uL — ABNORMAL LOW (ref 4.22–5.81)
RDW: 14 % (ref 11.5–15.5)
WBC: 16.2 K/uL — ABNORMAL HIGH (ref 4.0–10.5)
nRBC: 0 % (ref 0.0–0.2)

## 2024-08-24 LAB — TROPONIN I (HIGH SENSITIVITY)
Troponin I (High Sensitivity): 23 ng/L — ABNORMAL HIGH (ref <18)
Troponin I (High Sensitivity): 21 ng/L — ABNORMAL HIGH (ref <18)

## 2024-08-24 LAB — BRAIN NATRIURETIC PEPTIDE: B Natriuretic Peptide: 250 pg/mL — ABNORMAL HIGH (ref 0.0–100.0)

## 2024-08-24 LAB — LACTIC ACID, PLASMA: Lactic Acid, Venous: 1.1 mmol/L (ref 0.5–1.9)

## 2024-08-24 MED ORDER — SODIUM CHLORIDE 0.9 % IV SOLN
3.0000 g | Freq: Four times a day (QID) | INTRAVENOUS | Status: DC
  Administered 2024-08-25 – 2024-08-26 (×6): 3 g via INTRAVENOUS
  Filled 2024-08-24 (×6): qty 8

## 2024-08-24 MED ORDER — ROSUVASTATIN CALCIUM 20 MG PO TABS
40.0000 mg | ORAL_TABLET | Freq: Every day | ORAL | Status: DC
  Administered 2024-08-25: 40 mg via ORAL
  Filled 2024-08-24: qty 2

## 2024-08-24 MED ORDER — ONDANSETRON HCL 4 MG/2ML IJ SOLN
4.0000 mg | Freq: Once | INTRAMUSCULAR | Status: AC
  Administered 2024-08-24: 4 mg via INTRAVENOUS
  Filled 2024-08-24: qty 2

## 2024-08-24 MED ORDER — MEMANTINE HCL 10 MG PO TABS
10.0000 mg | ORAL_TABLET | Freq: Two times a day (BID) | ORAL | Status: DC
Start: 2024-08-24 — End: 2024-08-26
  Administered 2024-08-24 – 2024-08-26 (×4): 10 mg via ORAL
  Filled 2024-08-24 (×4): qty 1

## 2024-08-24 MED ORDER — FUROSEMIDE 20 MG PO TABS
20.0000 mg | ORAL_TABLET | Freq: Two times a day (BID) | ORAL | Status: DC
  Administered 2024-08-24 – 2024-08-26 (×4): 20 mg via ORAL
  Filled 2024-08-24 (×4): qty 1

## 2024-08-24 MED ORDER — ACETAMINOPHEN 650 MG RE SUPP: 650.0000 mg | Freq: Four times a day (QID) | RECTAL | Status: DC | PRN

## 2024-08-24 MED ORDER — SODIUM CHLORIDE 0.9 % IV SOLN
3.0000 g | Freq: Once | INTRAVENOUS | Status: AC
  Administered 2024-08-24: 3 g via INTRAVENOUS
  Filled 2024-08-24: qty 8

## 2024-08-24 MED ORDER — METHYLPREDNISOLONE SODIUM SUCC 125 MG IJ SOLR
125.0000 mg | Freq: Once | INTRAMUSCULAR | Status: AC
  Administered 2024-08-24: 125 mg via INTRAVENOUS
  Filled 2024-08-24: qty 2

## 2024-08-24 MED ORDER — HEPARIN SODIUM (PORCINE) 5000 UNIT/ML IJ SOLN
5000.0000 [IU] | Freq: Three times a day (TID) | INTRAMUSCULAR | Status: DC
  Administered 2024-08-25 – 2024-08-26 (×3): 5000 [IU] via SUBCUTANEOUS
  Filled 2024-08-24 (×3): qty 1

## 2024-08-24 MED ORDER — QUETIAPINE FUMARATE 25 MG PO TABS: 25.0000 mg | ORAL_TABLET | Freq: Every evening | ORAL | Status: DC | PRN

## 2024-08-24 MED ORDER — IPRATROPIUM-ALBUTEROL 0.5-2.5 (3) MG/3ML IN SOLN
3.0000 mL | Freq: Once | RESPIRATORY_TRACT | Status: AC
  Administered 2024-08-24: 3 mL via RESPIRATORY_TRACT
  Filled 2024-08-24: qty 3

## 2024-08-24 MED ORDER — RANOLAZINE ER 500 MG PO TB12
500.0000 mg | ORAL_TABLET | Freq: Two times a day (BID) | ORAL | Status: DC
  Administered 2024-08-24 – 2024-08-26 (×4): 500 mg via ORAL
  Filled 2024-08-24 (×4): qty 1

## 2024-08-24 MED ORDER — MORPHINE SULFATE (PF) 4 MG/ML IV SOLN
2.0000 mg | Freq: Once | INTRAVENOUS | Status: AC
  Administered 2024-08-24: 2 mg via INTRAVENOUS
  Filled 2024-08-24: qty 1

## 2024-08-24 MED ORDER — ALFUZOSIN HCL ER 10 MG PO TB24
10.0000 mg | ORAL_TABLET | Freq: Every morning | ORAL | Status: DC
  Administered 2024-08-25 – 2024-08-26 (×2): 10 mg via ORAL
  Filled 2024-08-24 (×2): qty 1

## 2024-08-24 MED ORDER — CLOPIDOGREL BISULFATE 75 MG PO TABS
75.0000 mg | ORAL_TABLET | Freq: Every day | ORAL | Status: DC
Start: 2024-08-25 — End: 2024-08-26
  Administered 2024-08-25 – 2024-08-26 (×2): 75 mg via ORAL
  Filled 2024-08-24 (×2): qty 1

## 2024-08-24 MED ORDER — RISPERIDONE 0.5 MG PO TABS
0.2500 mg | ORAL_TABLET | Freq: Every day | ORAL | Status: DC
Start: 2024-08-24 — End: 2024-08-26
  Administered 2024-08-24 – 2024-08-25 (×2): 0.25 mg via ORAL
  Filled 2024-08-24 (×2): qty 1

## 2024-08-24 MED ORDER — IOHEXOL 350 MG/ML SOLN
100.0000 mL | Freq: Once | INTRAVENOUS | Status: AC | PRN
  Administered 2024-08-24: 100 mL via INTRAVENOUS

## 2024-08-24 MED ORDER — POTASSIUM CHLORIDE CRYS ER 20 MEQ PO TBCR
40.0000 meq | EXTENDED_RELEASE_TABLET | Freq: Once | ORAL | Status: DC
  Filled 2024-08-24: qty 2

## 2024-08-24 MED ORDER — GABAPENTIN 100 MG PO CAPS
100.0000 mg | ORAL_CAPSULE | Freq: Three times a day (TID) | ORAL | Status: DC
Start: 2024-08-24 — End: 2024-08-26
  Administered 2024-08-24 – 2024-08-26 (×5): 100 mg via ORAL
  Filled 2024-08-24 (×5): qty 1

## 2024-08-24 MED ORDER — PANTOPRAZOLE SODIUM 40 MG PO TBEC
40.0000 mg | DELAYED_RELEASE_TABLET | Freq: Every day | ORAL | Status: DC
  Administered 2024-08-25 – 2024-08-26 (×2): 40 mg via ORAL
  Filled 2024-08-24 (×2): qty 1

## 2024-08-24 MED ORDER — ALBUTEROL SULFATE (2.5 MG/3ML) 0.083% IN NEBU: 2.5000 mg | INHALATION_SOLUTION | RESPIRATORY_TRACT | Status: DC | PRN

## 2024-08-24 MED ORDER — NITROGLYCERIN 0.4 MG SL SUBL
0.4000 mg | SUBLINGUAL_TABLET | SUBLINGUAL | Status: DC | PRN
  Administered 2024-08-25: 0.4 mg via SUBLINGUAL
  Filled 2024-08-24: qty 1

## 2024-08-24 MED ORDER — ISOSORBIDE MONONITRATE ER 60 MG PO TB24
120.0000 mg | ORAL_TABLET | Freq: Every morning | ORAL | Status: DC
  Administered 2024-08-25 – 2024-08-26 (×2): 120 mg via ORAL
  Filled 2024-08-24 (×2): qty 2

## 2024-08-24 MED ORDER — SODIUM CHLORIDE 0.9 % IV SOLN
500.0000 mg | INTRAVENOUS | Status: DC
  Administered 2024-08-25: 500 mg via INTRAVENOUS
  Filled 2024-08-24: qty 5

## 2024-08-24 MED ORDER — SODIUM CHLORIDE 0.9 % IV SOLN
1.0000 g | Freq: Once | INTRAVENOUS | Status: AC
  Administered 2024-08-24: 1 g via INTRAVENOUS
  Filled 2024-08-24: qty 10

## 2024-08-24 MED ORDER — DULOXETINE HCL 30 MG PO CPEP
30.0000 mg | ORAL_CAPSULE | Freq: Every day | ORAL | Status: DC
  Administered 2024-08-25 – 2024-08-26 (×2): 30 mg via ORAL
  Filled 2024-08-24 (×2): qty 1

## 2024-08-24 MED ORDER — ACETAMINOPHEN 325 MG PO TABS
650.0000 mg | ORAL_TABLET | Freq: Four times a day (QID) | ORAL | Status: DC | PRN
  Administered 2024-08-25: 650 mg via ORAL
  Filled 2024-08-24: qty 2

## 2024-08-24 MED ORDER — LINACLOTIDE 145 MCG PO CAPS
145.0000 ug | ORAL_CAPSULE | Freq: Every day | ORAL | Status: DC
  Administered 2024-08-25 – 2024-08-26 (×2): 145 ug via ORAL
  Filled 2024-08-24 (×2): qty 1

## 2024-08-24 MED ORDER — SODIUM CHLORIDE 0.9 % IV SOLN
500.0000 mg | Freq: Once | INTRAVENOUS | Status: AC
  Administered 2024-08-24: 500 mg via INTRAVENOUS
  Filled 2024-08-24: qty 5

## 2024-08-24 MED ORDER — DOCUSATE SODIUM 100 MG PO CAPS: 100.0000 mg | ORAL_CAPSULE | Freq: Every day | ORAL | Status: DC | PRN

## 2024-08-24 MED ORDER — LEVOTHYROXINE SODIUM 75 MCG PO TABS
75.0000 ug | ORAL_TABLET | Freq: Every day | ORAL | Status: DC
  Administered 2024-08-25 – 2024-08-26 (×2): 75 ug via ORAL
  Filled 2024-08-24 (×2): qty 1

## 2024-08-24 MED ORDER — OXYCODONE HCL 5 MG PO TABS
20.0000 mg | ORAL_TABLET | Freq: Four times a day (QID) | ORAL | Status: DC | PRN
  Administered 2024-08-24 – 2024-08-26 (×5): 20 mg via ORAL
  Filled 2024-08-24 (×5): qty 4

## 2024-08-24 NOTE — H&P (Signed)
 [facility] H&P   Patient Demographics:    Gordon Vandunk, is a 89 y.o. male  MRN: [id number]   DOB - 1934-01-14  Admit Date - 08/24/2024  Outpatient Primary MD for the patient is Bertell Satterfield, MD  Referring MD/NP/PA: PA Rigney  Patient coming from: home  Chief Complaint  Patient presents with   Shortness of Breath      HPI:    Clinton Dragone  is a 89 y.o. male, with medical history significant for past medical history   of Alzheimer's disease, carotid artery disease, coronary artery disease (Multivessel s/p CABG 1996. b. DESx2 to SVG-PDA in 2012 c. DES to distal LM in 11/2019 d. cath in 02/2021 showing patent LM stent, patent LIMA-LAD, patent SVG-OM1 with chronically occluded jump limb to OM2 and chronic occlusion of SVG-D2 and SVG-PDA and no targets for intervention), hypothyroidism, hypertension, OSA, hypothyroidism, HLD, depression, history of left lung radiation therapy, with chronic hypoxic respiratory failure but typically uses only 2 L of oxygen  at night . - Patient presents to ED secondary to dyspnea, he is demented, very poor historian, so history was obtained from wife by phone and ED staff, he is with progressive dementia per his wife, he had fall last week, followed by back pain, he is with nausea, vomiting today, as well cough and shortness of breath, he was seen by urgent care was instructed to come to ED for further evaluation. - In ED he was on 2 L oxygen , CT chest significant for bibasilar pneumonia, most likely aspiration, as well he had positive urine analysis, and leukocytosis at 16, so [name] hospitalist consulted to admit.   Review of systems:    He is poor historian due to his dementia  With Past History of the following :    Past Medical History:  Diagnosis Date   Alzheimer disease (HCC)    Anxiety    Arthritis    Atrophic gastritis    a. By EGD 02/2013.    Carotid artery disease (HCC)    a. mild-mod plaque, <50% stenosis bilat by duplex 2018.   Coronary atherosclerosis of native coronary artery    a. Multivessel s/p CABG 1996. b. DESx2 to SVG-PDA in 2012 c. DES to distal LM in 11/2019 d. cath in 02/2021 showing patent LM stent, patent LIMA-LAD, patent SVG-OM1 with chronically occluded jump limb to OM2 and chronic occlusion of SVG-D2 and SVG-PDA and no targets for intervention   DDD (degenerative disc disease)    Chronic back pain   Dementia (HCC)    Enlarged prostate    Essential hypertension    Hematuria    History of radiation therapy    Left Lung- 04/26/23-Dr. Lynwood Nasuti   Hypothyroidism    LBBB (left bundle branch block)    MI (myocardial infarction) (HCC)    Mixed hyperlipidemia    OA (osteoarthritis)    OSA (obstructive sleep apnea)  Pneumonia due to COVID-19 virus    February 2021   Sinus bradycardia    a. Aricept  and BB discontinued due to this.      Past Surgical History:  Procedure Laterality Date   CATARACT EXTRACTION W/PHACO Left 03/22/2022   Procedure: CATARACT EXTRACTION PHACO AND INTRAOCULAR LENS PLACEMENT (IOC);  Surgeon: Harrie Agent, MD;  Location: [facility];  Service: Ophthalmology;  Laterality: Left;  CDE: 25.22   COLONOSCOPY  08/03/2004   Jenkins-numerous large diverticula in the descending, transverse, descending, and sigmoid colon. Otherwise normal exam.   COLONOSCOPY  07/12/2012   RMR: External hemorrhoidal tag; multiple rectal and colonic polyps removed and/or treated as described above. Pancolonic diverticulosis. Bx-tubular adenomas, rectal hyperplastic polyp. next colonoscopy in 06/2015.   COLONOSCOPY N/A 06/22/2016   Procedure: COLONOSCOPY;  Surgeon: Oneil Budge, MD;  Location: [facility];  Service: Gastroenterology;  Laterality: N/A;  730   CORONARY ANGIOPLASTY WITH STENT PLACEMENT     CORONARY ARTERY BYPASS GRAFT  1996   LIMA to LAD, SVG to D2, SVG to PDA, SVG to OM1 and OM2   CORONARY STENT  INTERVENTION N/A 12/10/2019   Procedure: CORONARY STENT INTERVENTION;  Surgeon: Verlin Lonni BIRCH, MD;  Location: [facility];  Service: Cardiovascular;  Laterality: N/A;   CORONARY ULTRASOUND/IVUS N/A 12/10/2019   Procedure: Intravascular Ultrasound/IVUS;  Surgeon: Verlin Lonni BIRCH, MD;  Location: [facility];  Service: Cardiovascular;  Laterality: N/A;   ESOPHAGOGASTRODUODENOSCOPY N/A 03/16/2013   Procedure: ESOPHAGOGASTRODUODENOSCOPY (EGD);  Surgeon: Lamar CHRISTELLA Hollingshead, MD;  Location: [facility];  Service: Endoscopy;  Laterality: N/A;  12:00-moved to 1030 Leigh Ann notified pt   ESOPHAGOGASTRODUODENOSCOPY N/A 03/06/2013   Procedure: ESOPHAGOGASTRODUODENOSCOPY (EGD);  Surgeon: Lamar CHRISTELLA Hollingshead, MD;  Location: [facility];  Service: Endoscopy;  Laterality: N/A;   ESOPHAGOGASTRODUODENOSCOPY N/A 06/30/2024   Procedure: EGD (ESOPHAGOGASTRODUODENOSCOPY);  Surgeon: Hollingshead Lamar CHRISTELLA, MD;  Location: [facility];  Service: Endoscopy;  Laterality: N/A;   HERNIA REPAIR     LEFT HEART CATH AND CORS/GRAFTS ANGIOGRAPHY N/A 12/10/2019   Procedure: LEFT HEART CATH AND CORS/GRAFTS ANGIOGRAPHY;  Surgeon: Verlin Lonni BIRCH, MD;  Location: [facility];  Service: Cardiovascular;  Laterality: N/A;   LEFT HEART CATH AND CORS/GRAFTS ANGIOGRAPHY N/A 03/02/2021   Procedure: LEFT HEART CATH AND CORS/GRAFTS ANGIOGRAPHY;  Surgeon: Mady Lonni, MD;  Location: [facility];  Service: Cardiovascular;  Laterality: N/A;      Social History:     Social History   Tobacco Use   Smoking status: Former    Current packs/day: 0.00    Average packs/day: 2.0 packs/day for 40.0 years (80.0 ttl pk-yrs)    Types: Cigarettes    Start date: 12/27/1954    Quit date: 12/27/1994    Years since quitting: 29.6   Smokeless tobacco: Never  Substance Use Topics   Alcohol use: No    Alcohol/week: 0.0 standard drinks of alcohol        Family History :     Family History  Problem Relation  Age of Onset   Heart disease Other    Heart attack Mother    Colon cancer Neg Hx     Home Medications:   Prior to Admission medications   Medication Sig Start Date End Date Taking? Authorizing Provider  albuterol  (PROVENTIL ) (2.5 MG/3ML) 0.083% nebulizer solution Take 3 mLs (2.5 mg total) by nebulization every 2 (two) hours as needed for wheezing or shortness of breath. 11/24/22   Emokpae,  Courage, MD  albuterol  (VENTOLIN  HFA) 108 (90 Base) MCG/ACT inhaler Inhale 2 puffs into the lungs every 6 (six) hours as needed for wheezing or shortness of breath. 11/24/22   Pearlean Manus, MD  alfuzosin  (UROXATRAL ) 10 MG 24 hr tablet Take 10 mg by mouth every morning. 06/19/24   [provider]  aspirin  EC 81 MG tablet Take 1 tablet (81 mg total) by mouth daily. Swallow whole. Patient not taking: Reported on 05/30/2024 02/24/24   Laurence Locus, DO  Budeson-Glycopyrrol-Formoterol  (BREZTRI  AEROSPHERE) 160-9-4.8 MCG/ACT AERO Inhale 2 puffs into the lungs in the morning and at bedtime. Patient not taking: Reported on 07/01/2024 03/16/23   Sood, Vineet, MD  clopidogrel  (PLAVIX ) 75 MG tablet Take 75 mg by mouth daily. 03/14/24   [provider]  docusate sodium  (COLACE) 100 MG capsule Take 100 mg by mouth daily as needed for mild constipation.    [provider]  DULoxetine  (CYMBALTA ) 30 MG capsule Take 30 mg by mouth daily. 05/10/23   [provider]  furosemide  (LASIX ) 20 MG tablet Take 1 tablet (20 mg total) by mouth 2 (two) times daily. For fluid 11/24/22   Pearlean Manus, MD  gabapentin  (NEURONTIN ) 100 MG capsule Take 100 mg by mouth 3 (three) times daily. 03/04/23   [provider]  guaiFENesin -dextromethorphan  (ROBITUSSIN DM) 100-10 MG/5ML syrup Take 5 mLs by mouth every 4 (four) hours as needed for cough. 07/01/24   Maree, Pratik D, DO  isosorbide  mononitrate (IMDUR ) 120 MG 24 hr tablet Take 1 tablet (120 mg total) by mouth every morning. 11/24/22   Pearlean Manus, MD   levothyroxine  (SYNTHROID ) 75 MCG tablet Take 75 mcg by mouth daily. 02/23/22   [provider]  linaclotide  (LINZESS ) 145 MCG CAPS capsule Take 1 capsule (145 mcg total) by mouth daily before breakfast. 07/08/24   Cleotilde Rogue, MD  memantine  (NAMENDA ) 10 MG tablet Take 10 mg by mouth 2 (two) times daily.  03/05/16   [provider]  nitroGLYCERIN  (NITROSTAT ) 0.4 MG SL tablet Place 1 tablet (0.4 mg total) under the tongue every 5 (five) minutes as needed for chest pain. 07/08/24   Cleotilde Rogue, MD  oxycodone  (ROXICODONE ) 30 MG immediate release tablet Take 150 mg by mouth daily. 06/04/24   [provider]  pantoprazole  (PROTONIX ) 40 MG tablet Take 1 tablet (40 mg total) by mouth daily. 07/01/24 07/01/25  Maree, Pratik D, DO  potassium chloride  SA (KLOR-CON  M) 20 MEQ tablet Take 20 mEq by mouth daily. 06/19/24   [provider]  QUEtiapine  (SEROQUEL ) 25 MG tablet Take 25 mg by mouth at bedtime as needed. 03/14/24   [provider]  ranolazine  (RANEXA ) 500 MG 12 hr tablet Take 500 mg by mouth 2 (two) times daily. 07/06/23   [provider]  risperiDONE  (RISPERDAL ) 0.25 MG tablet Take 0.25 mg by mouth at bedtime. 03/18/23   [provider]  rosuvastatin  (CRESTOR ) 40 MG tablet Take 1 tablet (40 mg total) by mouth daily at 6 PM. 02/24/24 07/01/24  Laurence Locus, DO     Allergies:    No Known Allergies   Physical Exam:   Vitals  Blood pressure (!) 136/53, pulse 64, temperature 98.6 F (37 C), temperature source Oral, resp. rate (!) 22, height 5' 10 (1.778 m), weight 93 kg, SpO2 95%.   1. General Frail, chronically ill-appearing male, in no apparent distress, hard of hearing  2.  Demented, impaired judgment and insight, oriented x 2  3. No F.N deficits, ALL  C.Nerves Intact, Strength 5/5 all 4 extremities, Sensation intact all 4 extremities, Plantars down going.  4. Ears and Eyes appear Normal, Conjunctivae clear, PERRLA. Moist Oral Mucosa.  5.  Supple Neck, No JVD, No cervical lymphadenopathy appriciated, No Carotid Bruits.  6. Symmetrical Chest wall movement, he has rales at the bases with diminished air entry.  7. RRR, No Gallops, Rubs or Murmurs, No Parasternal Heave.  8. Positive Bowel Sounds, Abdomen Soft, No tenderness, No organomegaly appriciated,No rebound -guarding or rigidity.  9.  No Cyanosis, Normal Skin Turgor, No Skin Rash or Bruise.  10. Good muscle tone,  joints appear normal , no effusions, Normal ROM.     Data Review:    CBC Recent Labs  Lab 08/24/24 1318  WBC 16.2*  HGB 11.8*  HCT 35.5*  PLT 229  MCV 94.9  MCH 31.6  MCHC 33.2  RDW 14.0  LYMPHSABS 1.3  MONOABS 0.8  EOSABS 0.0  BASOSABS 0.0   ------------------------------------------------------------------------------------------------------------------  Chemistries  Recent Labs  Lab 08/24/24 1318 08/24/24 1444  NA 136  --   K 3.4*  --   CL 96*  --   CO2 26  --   GLUCOSE 134*  --   BUN 16  --   CREATININE 1.04  --   CALCIUM  8.9  --   AST  --  16  ALT  --  10  ALKPHOS  --  114  BILITOT  --  1.0   ------------------------------------------------------------------------------------------------------------------ estimated creatinine clearance is 57.3 mL/min (by C-G formula based on SCr of 1.04 mg/dL). ------------------------------------------------------------------------------------------------------------------ No results for input(s): TSH, T4TOTAL, T3FREE, THYROIDAB in the last 72 hours.  Invalid input(s): FREET3  Coagulation profile No results for input(s): INR, PROTIME in the last 168 hours. ------------------------------------------------------------------------------------------------------------------- No results for input(s): DDIMER in the last 72 hours. -------------------------------------------------------------------------------------------------------------------  Cardiac Enzymes No results for  input(s): CKMB, TROPONINI, MYOGLOBIN in the last 168 hours.  Invalid input(s): CK ------------------------------------------------------------------------------------------------------------------    Component Value Date/Time   BNP 250.0 (H) 08/24/2024 1318     ---------------------------------------------------------------------------------------------------------------  Urinalysis    Component Value Date/Time   COLORURINE YELLOW 08/24/2024 1504   APPEARANCEUR CLOUDY (A) 08/24/2024 1504   APPEARANCEUR Clear 03/10/2022 1355   LABSPEC 1.024 08/24/2024 1504   PHURINE 5.0 08/24/2024 1504   GLUCOSEU NEGATIVE 08/24/2024 1504   HGBUR MODERATE (A) 08/24/2024 1504   BILIRUBINUR NEGATIVE 08/24/2024 1504   BILIRUBINUR Negative 03/10/2022 1355   KETONESUR NEGATIVE 08/24/2024 1504   PROTEINUR 30 (A) 08/24/2024 1504   UROBILINOGEN 0.2 01/26/2015 2247   NITRITE POSITIVE (A) 08/24/2024 1504   LEUKOCYTESUR LARGE (A) 08/24/2024 1504    ----------------------------------------------------------------------------------------------------------------   Imaging Results:    CT Head Wo Contrast Result Date: 08/24/2024 CLINICAL DATA:  Recent head trauma, fall, headache EXAM: CT HEAD WITHOUT CONTRAST TECHNIQUE: Contiguous axial images were obtained from the base of the skull through the vertex without intravenous contrast. RADIATION DOSE REDUCTION: This exam was performed according to the departmental dose-optimization program which includes automated exposure control, adjustment of the mA and/or kV according to patient size and/or use of iterative reconstruction technique. COMPARISON:  11/23/2022 FINDINGS: Brain: Stable diffuse brain atrophy pattern without acute intracranial hemorrhage, mass lesion, infarction, shift, herniation hydrocephalus or extra-axial fluid collection. No focal mass effect or edema cisterns are patent. Cerebellar atrophy as well. Vascular: No hyperdense vessel or unexpected  calcification. Skull: Normal. Negative for fracture or focal lesion. Sinuses/Orbits: No acute finding. Other: None. IMPRESSION: Stable atrophy pattern. No acute intracranial abnormality by noncontrast  CT. Electronically Signed   By: CHRISTELLA.  Shick M.D.   On: 08/24/2024 18:27   CT ABDOMEN PELVIS W CONTRAST Result Date: 08/24/2024 CLINICAL DATA:  Acute generalized abdominal pain. EXAM: CT ABDOMEN AND PELVIS WITH CONTRAST TECHNIQUE: Multidetector CT imaging of the abdomen and pelvis was performed using the standard protocol following bolus administration of intravenous contrast. RADIATION DOSE REDUCTION: This exam was performed according to the departmental dose-optimization program which includes automated exposure control, adjustment of the mA and/or kV according to patient size and/or use of iterative reconstruction technique. CONTRAST:  OMNIPAQUE  IOHEXOL  350 MG/ML SOLN COMPARISON:  May 17, 2024. FINDINGS: Lower chest: Small left pleural effusion is noted with minimal adjacent subsegmental atelectasis. Mild patchy opacities are noted in right lower lobe concerning for pneumonia. Hepatobiliary: Minimal cholelithiasis. No biliary dilatation. Liver is unremarkable. Pancreas: Unremarkable. No pancreatic ductal dilatation or surrounding inflammatory changes. Spleen: Normal in size without focal abnormality. Adrenals/Urinary Tract: Adrenal glands appear normal. Bilateral renal cysts are noted. No hydronephrosis or renal obstruction is noted. Urinary bladder is unremarkable. Stomach/Bowel: Stomach is within normal limits. Appendix appears normal. No evidence of bowel wall thickening, distention, or inflammatory changes. Sigmoid diverticulosis is noted without inflammation. Vascular/Lymphatic: Aortic atherosclerosis. No enlarged abdominal or pelvic lymph nodes. Reproductive: Prostate is unremarkable. Other: No abdominal wall hernia or abnormality. No abdominopelvic ascites. Musculoskeletal: Interval development of  moderate compression deformity of T12 vertebral body concerning for acute to subacute fracture. IMPRESSION: 1. Moderate compression deformity of T12 vertebral body concerning for acute to subacute fracture. 2. Small left pleural effusion with minimal adjacent subsegmental atelectasis. 3. Mild patchy opacities are noted in right lower lobe concerning for pneumonia. 4. Minimal cholelithiasis. 5. Sigmoid diverticulosis without inflammation. 6. Aortic atherosclerosis. Aortic Atherosclerosis (ICD10-I70.0). Electronically Signed   By: Lynwood Landy Raddle M.D.   On: 08/24/2024 15:19   CT Angio Chest PE W/Cm &/Or Wo Cm Result Date: 08/24/2024 CLINICAL DATA:  Shortness of breath, cough. EXAM: CT ANGIOGRAPHY CHEST WITH CONTRAST TECHNIQUE: Multidetector CT imaging of the chest was performed using the standard protocol during bolus administration of intravenous contrast. Multiplanar CT image reconstructions and MIPs were obtained to evaluate the vascular anatomy. RADIATION DOSE REDUCTION: This exam was performed according to the departmental dose-optimization program which includes automated exposure control, adjustment of the mA and/or kV according to patient size and/or use of iterative reconstruction technique. CONTRAST:  OMNIPAQUE  IOHEXOL  350 MG/ML SOLN COMPARISON:  June 30, 2024. FINDINGS: Cardiovascular: Satisfactory opacification of the pulmonary arteries to the segmental level. No evidence of pulmonary embolism. Normal heart size. No pericardial effusion. Status post coronary artery bypass graft. Atherosclerosis of thoracic aorta without aneurysm or dissection. Mediastinum/Nodes: No enlarged mediastinal, hilar, or axillary lymph nodes. Thyroid  gland, trachea, and esophagus demonstrate no significant findings. Lungs/Pleura: Small left pleural effusion is noted with minimal adjacent subsegmental atelectasis. New large left lower lobe airspace opacity is noted concerning for pneumonia. Patchy opacities are noted in  right upper and lower lobes also concerning for pneumonia. Upper Abdomen: No acute abnormality. Musculoskeletal: New mildly displaced fracture is seen involving posterior portion of left fifth rib. Old left sixth rib fracture is noted. Review of the MIP images confirms the above findings. IMPRESSION: 1. No definite evidence of pulmonary embolus. 2. New large left lower lobe airspace opacity is noted concerning for pneumonia. Patchy airspace opacities are noted in right upper and lower lobes also concerning for multifocal pneumonia. 3. Small left pleural effusion is noted with minimal adjacent subsegmental atelectasis. 4. New  mildly displaced left fifth rib fracture is noted. 5. Aortic atherosclerosis. Aortic Atherosclerosis (ICD10-I70.0). Electronically Signed   By: Lynwood Landy Raddle M.D.   On: 08/24/2024 15:01   DG Chest 2 View Result Date: 08/24/2024 CLINICAL DATA:  Cough, shortness of breath. EXAM: CHEST - 2 VIEW COMPARISON:  June 30, 2024. FINDINGS: Stable cardiomediastinal silhouette. Status post coronary artery bypass graft. Right lung is clear. Stable left perihilar density is noted which may represent radiation change, although underlying neoplasm cannot be excluded. Bony thorax is unremarkable. IMPRESSION: Stable left perihilar density is noted which may represent radiation change, although underlying neoplasm cannot be excluded. Electronically Signed   By: Lynwood Landy Raddle M.D.   On: 08/24/2024 13:13    Assessment & Plan:    Principal Problem:   Multifocal pneumonia Active Problems:   OSA (obstructive sleep apnea)   Obesity, Class I, BMI 30-34.9   Left-sided weakness   Hypothyroidism   Dementia (HCC)   Chronic diastolic CHF (congestive heart failure) (HCC)    Fall Deconditioning T12 compression fraction -With dementia, unsteady gait, currently with infectious process with fall sustaining T12 fracture - Consult PT, OT - Surgery input appreciated, commendation for TLSO brace for comfort,  can wear as needed, okay to mobilize, patient should see Dr. Deward in 2 weeks as an outpatient to follow-up x-rays.  Chronic respiratory failure with hypoxia Aspiration pneumonia -He is on 2 L oxygen , at baseline  -Imaging significant for basilar pneumonia, concerning for aspiration, as discussed with wife he is with coughing usually, as well he had vomiting prior to presentation. -Continue with IV Unasyn  and azithromycin  -Admitted under pneumonia pathway, continue to encourage using incentive spirometer -Follow-up on sputum cultures, Legionella and strep pneumonia antigen -Will consult SLP    Coronary artery disease  Hypertension - Resume Plavix , Imdur , Ranexa  and Crestor   - Not on beta-blockers due to baseline bradycardia  UTI -Continue with IV antibiotics, follow urine cultures   Alzheimer disease (HCC) - Chronic. On namenda  bid. - Continue with Seroquel  as needed at bedtime   Chronic pain syndrome - Significant dose of point medicine, reviewed [place] PMP website, he is on 30 mg oxycodone  4 times daily, for now we will keep on 20 mg p.o. every 6 hours as needed       DVT Prophylaxis Heparin    AM Labs Ordered, also please review Full Orders  Family Communication: Admission, patients condition and plan of care including tests being ordered have been discussed with the  who indicate understanding and agree with the plan and Code Status.  Code Status full code, wife will discuss with family regarding DNR  Likely DC to home, very likely he will need SNF  Consults called: Neurosurgery called by ED and they left a note regarding T12 fracture  Admission status: Inpatient  Time spent in minutes : 70 minutes   Brayton Lye M.D on 08/24/2024 at 7:51 PM   [name] - Office  [phone]

## 2024-08-24 NOTE — ED Notes (Signed)
 Placed on 2L ?

## 2024-08-24 NOTE — Consult Note (Signed)
 ED Pharmacy Antibiotic Sign Off An antibiotic consult was received from an ED provider for Unasyn  per pharmacy dosing for Aspiration Pneumonia. A chart review was completed to assess appropriateness.   The following one time order(s) were placed:  Unasyn  3G x1  Further antibiotic and/or antibiotic pharmacy consults should be ordered by the admitting provider if indicated.   Thank you for allowing pharmacy to be a part of this patient's care.   Annabella LOISE Banks, [facility]  Clinical Pharmacist 08/24/24 7:34 PM

## 2024-08-24 NOTE — ED Notes (Signed)
 Tech noted pt's abd hard and distended while doing EKG in triage.

## 2024-08-24 NOTE — ED Triage Notes (Signed)
 Pt c/o pain all over, sent from [facility], wife states they stated pt has PNA and CHF.  + cough and SOB

## 2024-08-24 NOTE — Progress Notes (Signed)
 Incentive Spirometer given to patient and instructions given.  Patient was able to achieve a little over with good effort X10.

## 2024-08-24 NOTE — ED Provider Notes (Signed)
 [place] EMERGENCY DEPARTMENT AT [facility] Provider Note   CSN: [id number] Arrival date & time: 08/24/24  1208     Patient presents with: Shortness of Breath   Dustin [name] is a 89 y.o. male.   Patient is an 89 year old male who presents to the emergency department with a chief complaint of shortness of breath, abdominal pain, back pain which has been ongoing for approximately the past week.  Patient was evaluated urgent care just prior to arrival and sent to emergency department for further evaluation.  He does admit to associated nausea and vomiting.  He has had an ongoing productive cough as well.  He does note that he intermittently wears oxygen  at home as needed and is currently on 2 L nasal cannula at this point.  Patient denies any recent falls or blunt trauma.   Shortness of Breath Associated symptoms: abdominal pain, cough and vomiting        Prior to Admission medications   Medication Sig Start Date End Date Taking? Authorizing Provider  albuterol  (PROVENTIL ) (2.5 MG/3ML) 0.083% nebulizer solution Take 3 mLs (2.5 mg total) by nebulization every 2 (two) hours as needed for wheezing or shortness of breath. 11/24/22   Pearlean Manus, MD  albuterol  (VENTOLIN  HFA) 108 (90 Base) MCG/ACT inhaler Inhale 2 puffs into the lungs every 6 (six) hours as needed for wheezing or shortness of breath. 11/24/22   Pearlean Manus, MD  alfuzosin  (UROXATRAL ) 10 MG 24 hr tablet Take 10 mg by mouth every morning. 06/19/24   [provider]  aspirin  EC 81 MG tablet Take 1 tablet (81 mg total) by mouth daily. Swallow whole. Patient not taking: Reported on 05/30/2024 02/24/24   Laurence Locus, DO  Budeson-Glycopyrrol-Formoterol  (BREZTRI  AEROSPHERE) 160-9-4.8 MCG/ACT AERO Inhale 2 puffs into the lungs in the morning and at bedtime. Patient not taking: Reported on 07/01/2024 03/16/23   Sood, Vineet, MD  clopidogrel  (PLAVIX ) 75 MG tablet Take 75 mg by mouth daily. 03/14/24   [provider]  docusate sodium  (COLACE) 100 MG capsule Take 100 mg by mouth daily as needed for mild constipation.    [provider]  DULoxetine  (CYMBALTA ) 30 MG capsule Take 30 mg by mouth daily. 05/10/23   [provider]  furosemide  (LASIX ) 20 MG tablet Take 1 tablet (20 mg total) by mouth 2 (two) times daily. For fluid 11/24/22   Pearlean Manus, MD  gabapentin  (NEURONTIN ) 100 MG capsule Take 100 mg by mouth 3 (three) times daily. 03/04/23   [provider]  guaiFENesin -dextromethorphan  (ROBITUSSIN DM) 100-10 MG/5ML syrup Take 5 mLs by mouth every 4 (four) hours as needed for cough. 07/01/24   Maree, Pratik D, DO  isosorbide  mononitrate (IMDUR ) 120 MG 24 hr tablet Take 1 tablet (120 mg total) by mouth every morning. 11/24/22   Pearlean Manus, MD  levothyroxine  (SYNTHROID ) 75 MCG tablet Take 75 mcg by mouth daily. 02/23/22   [provider]  linaclotide  (LINZESS ) 145 MCG CAPS capsule Take 1 capsule (145 mcg total) by mouth daily before breakfast. 07/08/24   Cleotilde Rogue, MD  memantine  (NAMENDA ) 10 MG tablet Take 10 mg by mouth 2 (two) times daily.  03/05/16   [provider]  nitroGLYCERIN  (NITROSTAT ) 0.4 MG SL tablet Place 1 tablet (0.4 mg total) under the tongue every 5 (five) minutes as needed for chest pain. 07/08/24   Cleotilde Rogue, MD  oxycodone  (ROXICODONE ) 30 MG immediate release tablet Take 150 mg by mouth daily. 06/04/24   [provider]  pantoprazole  (PROTONIX ) 40 MG tablet Take 1 tablet (40 mg total) by mouth daily. 07/01/24 07/01/25  Maree, Pratik D, DO  potassium chloride  SA (KLOR-CON  M) 20 MEQ tablet Take 20 mEq by mouth daily. 06/19/24   [provider]  QUEtiapine  (SEROQUEL ) 25 MG tablet Take 25 mg by mouth at bedtime as needed. 03/14/24   [provider]  ranolazine  (RANEXA ) 500 MG 12 hr tablet Take 500 mg by mouth 2 (two) times daily. 07/06/23   [provider]  risperiDONE  (RISPERDAL ) 0.25 MG tablet Take 0.25  mg by mouth at bedtime. 03/18/23   [provider]  rosuvastatin  (CRESTOR ) 40 MG tablet Take 1 tablet (40 mg total) by mouth daily at 6 PM. 02/24/24 07/01/24  Laurence Locus, DO    Allergies: Patient has no known allergies.    Review of Systems  Respiratory:  Positive for cough and shortness of breath.   Gastrointestinal:  Positive for abdominal pain, nausea and vomiting.  All other systems reviewed and are negative.   Updated Vital Signs BP 137/77 (BP Location: Left Arm)   Pulse 66   Temp 98.6 F (37 C) (Oral)   Resp (!) 22   Ht 5' 10 (1.778 m)   Wt 93 kg   SpO2 95%   BMI 29.42 kg/m   Physical Exam Vitals and nursing note reviewed.  Constitutional:      General: He is not in acute distress.    Appearance: Normal appearance. He is not ill-appearing.  HENT:     Head: Normocephalic and atraumatic.     Nose: Nose normal.     Mouth/Throat:     Mouth: Mucous membranes are moist.  Eyes:     Extraocular Movements: Extraocular movements intact.     Conjunctiva/sclera: Conjunctivae normal.     Pupils: Pupils are equal, round, and reactive to light.  Cardiovascular:     Rate and Rhythm: Normal rate and regular rhythm.     Pulses: Normal pulses.     Heart sounds: Normal heart sounds. No murmur heard.    No gallop.  Pulmonary:     Effort: Pulmonary effort is normal. No tachypnea.     Breath sounds: Wheezing present. No decreased breath sounds, rhonchi or rales.  Chest:     Chest wall: No tenderness.  Abdominal:     General: Abdomen is flat. Bowel sounds are normal.     Palpations: Abdomen is soft. There is no hepatomegaly or mass.     Comments: Diffuse abdominal tenderness  Musculoskeletal:        General: Normal range of motion.     Cervical back: Normal range of motion and neck supple.     Right lower leg: No edema.     Left lower leg: No edema.  Skin:    General: Skin is warm and dry.     Findings: No erythema or rash.  Neurological:     General: No focal deficit  present.     Mental Status: He is alert and oriented to person, place, and time. Mental status is at baseline.     Cranial Nerves: No cranial nerve deficit.     Motor: No weakness.  Psychiatric:        Mood and Affect: Mood normal.        Behavior: Behavior normal.        Thought Content: Thought content normal.        Judgment: Judgment normal.     (all labs  ordered are listed, but only abnormal results are displayed) Labs Reviewed  CBC WITH DIFFERENTIAL/PLATELET - Abnormal; Notable for the following components:      Result Value   WBC 16.2 (*)    RBC 3.74 (*)    Hemoglobin 11.8 (*)    HCT 35.5 (*)    Neutro Abs 13.9 (*)    All other components within normal limits  BASIC METABOLIC PANEL WITH GFR - Abnormal; Notable for the following components:   Potassium 3.4 (*)    Chloride 96 (*)    Glucose, Bld 134 (*)    All other components within normal limits  BRAIN NATRIURETIC PEPTIDE - Abnormal; Notable for the following components:   B Natriuretic Peptide 250.0 (*)    All other components within normal limits  TROPONIN I (HIGH SENSITIVITY) - Abnormal; Notable for the following components:   Troponin I (High Sensitivity) 23 (*)    All other components within normal limits  RESP PANEL BY RT-PCR (RSV, FLU A&B, COVID)  RVPGX2  CULTURE, BLOOD (ROUTINE X 2)  CULTURE, BLOOD (ROUTINE X 2)  LACTIC ACID, PLASMA  HEPATIC FUNCTION PANEL  TROPONIN I (HIGH SENSITIVITY)    EKG: EKG Interpretation Date/Time:  Friday August 24 2024 12:30:29 EDT Ventricular Rate:  74 PR Interval:  154 QRS Duration:  118 QT Interval:  416 QTC Calculation: 461 R Axis:   86  Text Interpretation: Normal sinus rhythm Incomplete left bundle branch block Marked ST abnormality, possible inferior subendocardial injury Abnormal ECG When compared with ECG of 30-Jun-2024 17:05, increased ST depressions from prior Confirmed by Towana Sharper [phone]) on 08/24/2024 12:33:22 PM  Radiology: DG Chest 2 View Result  Date: 08/24/2024 CLINICAL DATA:  Cough, shortness of breath. EXAM: CHEST - 2 VIEW COMPARISON:  June 30, 2024. FINDINGS: Stable cardiomediastinal silhouette. Status post coronary artery bypass graft. Right lung is clear. Stable left perihilar density is noted which may represent radiation change, although underlying neoplasm cannot be excluded. Bony thorax is unremarkable. IMPRESSION: Stable left perihilar density is noted which may represent radiation change, although underlying neoplasm cannot be excluded. Electronically Signed   By: Lynwood Landy Raddle M.D.   On: 08/24/2024 13:13     .Critical Care  Performed by: Daralene Lonni BIRCH, PA-C Authorized by: Daralene Lonni BIRCH, PA-C   Critical care provider statement:    Critical care time (minutes):  35   Critical care was necessary to treat or prevent imminent or life-threatening deterioration of the following conditions: pneumonia, UTI, vertebral fracture.   Critical care was time spent personally by me on the following activities:  Development of treatment plan with patient or surrogate, discussions with consultants, evaluation of patient's response to treatment, examination of patient, ordering and review of laboratory studies, ordering and review of radiographic studies, ordering and performing treatments and interventions, pulse oximetry, re-evaluation of patient's condition and review of old charts   I assumed direction of critical care for this patient from another provider in my specialty: no     Care discussed with: admitting provider      Medications Ordered in the ED  methylPREDNISolone  sodium succinate (SOLU-MEDROL ) 125 mg/2 mL injection 125 mg (has no administration in time range)  morphine  (PF) 4 MG/ML injection 2 mg (has no administration in time range)  ondansetron  (ZOFRAN ) injection 4 mg (has no administration in time range)  ipratropium-albuterol  (DUONEB) 0.5-2.5 (3) MG/3ML nebulizer solution 3 mL (3 mLs Nebulization Given  08/24/24 1439)  ipratropium-albuterol  (DUONEB) 0.5-2.5 (3) MG/3ML nebulizer solution 3  mL (3 mLs Nebulization Given 08/24/24 1439)  iohexol  (OMNIPAQUE ) 350 MG/ML injection 100 mL (100 mLs Intravenous Contrast Given 08/24/24 1417)                                    Medical Decision Making Amount and/or Complexity of Data Reviewed Labs: ordered. Radiology: ordered.  Risk Prescription drug management. Decision regarding hospitalization.   This patient presents to the ED for concern of shortness of breath, generalized pain, abdominal pain, back pain, this involves an extensive number of treatment options, and is a complaint that carries with it a high risk of complications and morbidity.  The differential diagnosis includes sepsis, pneumonia, acute appendicitis, cholecystitis, small bowel obstruction, diverticulitis, testicular torsion, pyelonephritis, kidney stone, pancreatitis, urinary tract infection, vertebral fracture, long bone or joint fracture, intracranial hemorrhage   Co morbidities that complicate the patient evaluation  Dementia, COPD   Additional history obtained:  Additional history obtained from wife External records from outside source obtained and reviewed including medical records   Lab Tests:  I Ordered, and personally interpreted labs.  The pertinent results include: Leukocytosis, anemia, normal kidney function liver function, mild hypokalemia, downtrending troponin, elevated BNP, urinalysis with positive nitrite and large leukocytes, negative viral swab   Imaging Studies ordered:  I ordered imaging studies including CT scan head, CTA chest, CT abdomen pelvis, chest x-ray I independently visualized and interpreted imaging which showed no acute intracranial hemorrhage, no pulmonary embolus, multifocal infiltrates, left rib fracture, T12 compression fracture I agree with the radiologist interpretation   Cardiac Monitoring: / EKG:  The patient was maintained on  a cardiac monitor.  I personally viewed and interpreted the cardiac monitored which showed an underlying rhythm of: Normal sinus rhythm, ST depressions inferior leads, no STEMI, nonspecific T wave changes   Consultations Obtained:  I requested consultation with the hospitalist,  and discussed lab and imaging findings as well as pertinent plan - they recommend: Patient   Problem List / ED Course / Critical interventions / Medication management  Patient is doing well at this time and does remain stable.  He continues to maintain his O2 saturations on his home 2 L nasal cannula.  He has been treated for multifocal pneumonia with Rocephin  and azithromycin .  He also has concerning findings for possible urinary tract infection.  Patient did note that he did have a fall on my reevaluation that did occur 1 week ago.  CT scan of the head was ordered at that point.  He has no indication of acute intracranial hemorrhage.  Did discuss the findings of the T12 compression fracture with neurosurgery who did recommend TLSO brace and outpatient follow-up.  He has no concern neurological deficits at this point.  He has no indication of a pulmonary embolus on imaging.  There was no acute surgical process noted on CT scan of the abdomen and pelvis.  Vital signs have remained stable.  Have avoided IV fluids in this patient given his history of CHF.  He is not actively septic at this point.  Have discussed patient case with Dr. Sherlon with the hospitalist service who has excepted for admission. I ordered medication including DuoNeb, Solu-Medrol , morphine , Zofran , Rocephin , azithromycin  for pneumonia, urinary tract infection Reevaluation of the patient after these medicines showed that the patient improved I have reviewed the patients home medicines and have made adjustments as needed   Social Determinants of Health:  None  Test / Admission - Considered:  Admission       Final diagnoses:  None    ED  Discharge Orders     None          Daralene Lonni JONETTA DEVONNA 08/24/24 2016    Towana Ozell BROCKS, MD 08/25/24 [phone]

## 2024-08-24 NOTE — ED Notes (Signed)
 [facility] paged for TLSO STAT Consult

## 2024-08-24 NOTE — Consult Note (Signed)
 Pharmacy Antibiotic Note  Dustin [name] is a 89 y.o. male admitted on 08/24/2024 with pneumonia.  Pharmacy has been consulted for Unasyn  dosing.  Plan:  Unasyn  3 g IV q6h  Height: 5' 10 (177.8 cm) Weight: 93 kg (205 lb 0.4 oz) IBW/kg (Calculated) : 73  Temp (24hrs), Avg:98.6 F (37 C), Min:98.6 F (37 C), Max:98.6 F (37 C)  Recent Labs  Lab 08/24/24 1318 08/24/24 1346  WBC 16.2*  --   CREATININE 1.04  --   LATICACIDVEN  --  1.1    Estimated Creatinine Clearance: 57.3 mL/min (by C-G formula based on SCr of 1.04 mg/dL).    No Known Allergies  Antimicrobials this admission: Ceftriaxone  8/29 x 1 Azithromycin  8/29 >>  Unasyn  8/29 >>   Dose adjustments this admission: N/A  Microbiology results: 8/29 BCx: pending 8/29 UCx: pending   Thank you for allowing pharmacy to be a part of this patient's care.  Marolyn KATHEE Mare 08/24/2024 7:57 PM

## 2024-08-24 NOTE — Progress Notes (Signed)
   Providing Compassionate, Quality Care - Together    Patient sustained a T12 compression fracture. No reported falls, but productive cough, shortness of breath, abdominal pain, and back pain. TLSO brace ordered for comfort. Patient can wear as needed. He is fine to mobilize. Patient should follow up with Dr. Louis in 2 weeks as an outpatient for follow up x-rays.    Dustin Beck, DNP, AGNP-C Nurse Practitioner  [facility] [address], [place], [place] [address] P: [phone]    F: [phone]

## 2024-08-25 DIAGNOSIS — S2232XA Fracture of one rib, left side, initial encounter for closed fracture: Secondary | ICD-10-CM | POA: Diagnosis not present

## 2024-08-25 DIAGNOSIS — S22089A Unspecified fracture of T11-T12 vertebra, initial encounter for closed fracture: Secondary | ICD-10-CM | POA: Diagnosis not present

## 2024-08-25 DIAGNOSIS — N3001 Acute cystitis with hematuria: Secondary | ICD-10-CM | POA: Diagnosis not present

## 2024-08-25 DIAGNOSIS — J189 Pneumonia, unspecified organism: Secondary | ICD-10-CM | POA: Diagnosis not present

## 2024-08-25 LAB — CBC
HCT: 34.8 % — ABNORMAL LOW (ref 39.0–52.0)
Hemoglobin: 11.3 g/dL — ABNORMAL LOW (ref 13.0–17.0)
MCH: 30.9 pg (ref 26.0–34.0)
MCHC: 32.5 g/dL (ref 30.0–36.0)
MCV: 95.1 fL (ref 80.0–100.0)
Platelets: 225 K/uL (ref 150–400)
RBC: 3.66 MIL/uL — ABNORMAL LOW (ref 4.22–5.81)
RDW: 13.9 % (ref 11.5–15.5)
WBC: 12.6 K/uL — ABNORMAL HIGH (ref 4.0–10.5)
nRBC: 0 % (ref 0.0–0.2)

## 2024-08-25 LAB — BASIC METABOLIC PANEL WITH GFR
Anion gap: 8 (ref 5–15)
BUN: 16 mg/dL (ref 8–23)
CO2: 28 mmol/L (ref 22–32)
Calcium: 8.8 mg/dL — ABNORMAL LOW (ref 8.9–10.3)
Chloride: 101 mmol/L (ref 98–111)
Creatinine, Ser: 0.84 mg/dL (ref 0.61–1.24)
GFR, Estimated: >60 mL/min (ref >60)
Glucose, Bld: 177 mg/dL — ABNORMAL HIGH (ref 70–99)
Potassium: 3.5 mmol/L (ref 3.5–5.1)
Sodium: 137 mmol/L (ref 135–145)

## 2024-08-25 LAB — STREP PNEUMONIAE URINARY ANTIGEN: Strep Pneumo Urinary Antigen: NEGATIVE

## 2024-08-25 MED ORDER — OYSTER SHELL CALCIUM/D3 500-5 MG-MCG PO TABS
1.0000 | ORAL_TABLET | Freq: Two times a day (BID) | ORAL | Status: DC
  Administered 2024-08-25 – 2024-08-26 (×3): 1 via ORAL
  Filled 2024-08-25 (×3): qty 1

## 2024-08-25 MED ORDER — HYDROMORPHONE HCL 1 MG/ML IJ SOLN
0.5000 mg | INTRAMUSCULAR | Status: DC | PRN
  Administered 2024-08-25: 0.5 mg via INTRAVENOUS
  Filled 2024-08-25: qty 0.5

## 2024-08-25 MED ORDER — POTASSIUM CHLORIDE CRYS ER 20 MEQ PO TBCR
40.0000 meq | EXTENDED_RELEASE_TABLET | Freq: Once | ORAL | Status: AC
Start: 2024-08-25 — End: 2024-08-25
  Administered 2024-08-25: 40 meq via ORAL

## 2024-08-25 NOTE — Plan of Care (Signed)

## 2024-08-25 NOTE — Evaluation (Signed)
 Clinical/Bedside Swallow Evaluation Patient Details  Name: Dustin [name] MRN: [id number] Date of Birth: [birth year]  Today's Date: 08/25/2024 Time: SLP Start Time (ACUTE ONLY): 1105 SLP Stop Time (ACUTE ONLY): 1130 SLP Time Calculation (min) (ACUTE ONLY): 25 min  Past Medical History:  Past Medical History:  Diagnosis Date   Alzheimer disease (HCC)    Anxiety    Arthritis    Atrophic gastritis    a. By EGD 02/2013.   Carotid artery disease (HCC)    a. mild-mod plaque, <50% stenosis bilat by duplex 2018.   Coronary atherosclerosis of native coronary artery    a. Multivessel s/p CABG 1996. b. DESx2 to SVG-PDA in 2012 c. DES to distal LM in 11/2019 d. cath in 02/2021 showing patent LM stent, patent LIMA-LAD, patent SVG-OM1 with chronically occluded jump limb to OM2 and chronic occlusion of SVG-D2 and SVG-PDA and no targets for intervention   DDD (degenerative disc disease)    Chronic back pain   Dementia (HCC)    Enlarged prostate    Essential hypertension    Hematuria    History of radiation therapy    Left Lung- 04/26/23-Dr. Lynwood Nasuti   Hypothyroidism    LBBB (left bundle branch block)    MI (myocardial infarction) (HCC)    Mixed hyperlipidemia    OA (osteoarthritis)    OSA (obstructive sleep apnea)    Pneumonia due to COVID-19 virus    February 2021   Sinus bradycardia    a. Aricept  and BB discontinued due to this.   Past Surgical History:  Past Surgical History:  Procedure Laterality Date   CATARACT EXTRACTION W/PHACO Left 03/22/2022   Procedure: CATARACT EXTRACTION PHACO AND INTRAOCULAR LENS PLACEMENT (IOC);  Surgeon: Harrie Lynwood, MD;  Location: [facility];  Service: Ophthalmology;  Laterality: Left;  CDE: 25.22   COLONOSCOPY  08/03/2004   Jenkins-numerous large diverticula in the descending, transverse, descending, and sigmoid colon. Otherwise normal exam.   COLONOSCOPY  07/12/2012   RMR: External hemorrhoidal tag; multiple rectal and colonic polyps removed and/or  treated as described above. Pancolonic diverticulosis. Bx-tubular adenomas, rectal hyperplastic polyp. next colonoscopy in 06/2015.   COLONOSCOPY N/A 06/22/2016   Procedure: COLONOSCOPY;  Surgeon: Oneil Budge, MD;  Location: [facility];  Service: Gastroenterology;  Laterality: N/A;  730   CORONARY ANGIOPLASTY WITH STENT PLACEMENT     CORONARY ARTERY BYPASS GRAFT  1996   LIMA to LAD, SVG to D2, SVG to PDA, SVG to OM1 and OM2   CORONARY STENT INTERVENTION N/A 12/10/2019   Procedure: CORONARY STENT INTERVENTION;  Surgeon: Verlin Lonni BIRCH, MD;  Location: [facility] INVAS[facility] CV LAB;  Service: Cardiovascular;  Laterality: N/A;   CORONARY ULTRASOUND/IVUS N/A 12/10/2019   Procedure: Intravascular Ultrasound/IVUS;  Surgeon: Verlin Lonni BIRCH, MD;  Location: [facility] CV LAB;  Service: Cardiovascular;  Laterality: N/A;   ESOPHAGOGASTRODUODENOSCOPY N/A 03/16/2013   Procedure: ESOPHAGOGASTRODUODENOSCOPY (EGD);  Surgeon: Lamar CHRISTELLA Hollingshead, MD;  Location: [facility];  Service: Endoscopy;  Laterality: N/A;  12:00-moved to 1030 Leigh Ann notified pt   ESOPHAGOGASTRODUODENOSCOPY N/A 03/06/2013   Procedure: ESOPHAGOGASTRODUODENOSCOPY (EGD);  Surgeon: Lamar CHRISTELLA Hollingshead, MD;  Location: [facility];  Service: Endoscopy;  Laterality: N/A;   ESOPHAGOGASTRODUODENOSCOPY N/A 06/30/2024   Procedure: EGD (ESOPHAGOGASTRODUODENOSCOPY);  Surgeon: Hollingshead Lamar CHRISTELLA, MD;  Location: [facility];  Service: Endoscopy;  Laterality: N/A;   HERNIA REPAIR     LEFT HEART CATH AND CORS/GRAFTS ANGIOGRAPHY N/A 12/10/2019   Procedure: LEFT HEART CATH AND CORS/GRAFTS  ANGIOGRAPHY;  Surgeon: Verlin Lonni BIRCH, MD;  Location: [facility] IN[facility] CV LAB;  Service: Cardiovascular;  Laterality: N/A;   LEFT HEART CATH AND CORS/GRAFTS ANGIOGRAPHY N/A 03/02/2021   Procedure: LEFT HEART CATH AND CORS/GRAFTS ANGIOGRAPHY;  Surgeon: Mady Lonni, MD;  Location: [facility];  Service: Cardiovascular;  Laterality: N/A;   HPI:  Pt is a 89  year old male admitted after falls resulting in T12 compression fx and pneumonia. Pt has a history of 06/27/24 food impaction concern for esophageal motility disorder. Pt aware, states he avoids tough foods. Note that he doesn't have back dentition. Also suffers from Alzheimers but is a good historian.    Assessment / Plan / Recommendation  Clinical Impression  Pt demonstrates no signs of oropharyngeal dysphagia. He is aware of some difficulty with esophageal motility and avoids tough meats, thoroughly chews his food and goes slowly with meals. Sleeps upright in a recliner. Low suspicion for oropharyngeal dysphagia related to pna dx. No diet modifications needed from current dys 2/thin liquid diet. Reinforced basic precautions and oral care as prevention. Will sign off at this time. SLP Visit Diagnosis: Dysphagia, oropharyngeal phase (R13.12)    Aspiration Risk  Mild aspiration risk    Diet Recommendation Dysphagia 2 (Fine chop);Thin liquid    Liquid Administration via: Cup;Straw Supervision: Patient able to self feed Compensations: Slow rate;Small sips/bites Postural Changes: Seated upright at 90 degrees;Remain upright for at least 30 minutes after po intake    Other  Recommendations       Assistance Recommended at Discharge    Functional Status Assessment    Frequency and Duration            Prognosis        Swallow Study   General HPI: Pt is a 89 year old male admitted after falls resulting in T12 compression fx and pneumonia. Pt has a history of 06/27/24 food impaction concern for esophageal motility disorder. Pt aware, states he avoids tough foods. Note that he doesn't have back dentition. Also suffers from Alzheimers but is a good historian. Type of Study: Bedside Swallow Evaluation Previous Swallow Assessment: none Diet Prior to this Study: Dysphagia 2 (finely chopped);Thin liquids (Level 0) Temperature Spikes Noted: No Respiratory Status: Nasal cannula History of Recent  Intubation: No Behavior/Cognition: Alert;Cooperative;Pleasant mood Oral Cavity Assessment: Within Functional Limits Oral Care Completed by SLP: No Oral Cavity - Dentition: Missing dentition Vision: Functional for self-feeding Self-Feeding Abilities: Able to feed self Patient Positioning: Upright in bed    Oral/Motor/Sensory Function Overall Oral Motor/Sensory Function: Within functional limits   Ice Chips     Thin Liquid Thin Liquid: Within functional limits Presentation: Straw;Cup;Self Fed    Nectar Thick Nectar Thick Liquid: Not tested   Honey Thick Honey Thick Liquid: Not tested   Puree Puree: Within functional limits   Solid     Solid: Within functional limits Presentation: Self Fed      Darcel Frane, Consuelo Fitch 08/25/2024,11:30 AM

## 2024-08-25 NOTE — Progress Notes (Signed)
 Progress Note   Patient: Dustin [name] FMW:[id number] DOB: [birth year] DOA: 08/24/2024     1 DOS: the patient was seen and examined on 08/25/2024   Brief hospital admission narrative: As per H&P written by Dr. Sherlon on 08/24/2024 Dustin [name]  is a 89 y.o. male, with medical history significant for past medical history   of Alzheimer's disease, carotid artery disease, coronary artery disease (Multivessel s/p CABG 1996. b. DESx2 to SVG-PDA in 2012 c. DES to distal LM in 11/2019 d. cath in 02/2021 showing patent LM stent, patent LIMA-LAD, patent SVG-OM1 with chronically occluded jump limb to OM2 and chronic occlusion of SVG-D2 and SVG-PDA and no targets for intervention), hypothyroidism, hypertension, OSA, hypothyroidism, HLD, depression, history of left lung radiation therapy, with chronic hypoxic respiratory failure but typically uses only 2 L of oxygen  at night . - Patient presents to ED secondary to dyspnea, he is demented, very poor historian, so history was obtained from wife by phone and ED staff, he is with progressive dementia per his wife, he had fall last week, followed by back pain, he is with nausea, vomiting today, as well cough and shortness of breath, he was seen by urgent care was instructed to come to ED for further evaluation. - In ED he was on 2 L oxygen , CT chest significant for bibasilar pneumonia, most likely aspiration, as well he had positive urine analysis, and leukocytosis at 16, so [name] hospitalist consulted to admit.  Assessment and plan 1-fall/deconditioning and T12 compression fracture - Per neurosurgery evaluation no surgical intervention needed - TLSO brace to be used while out of bed to provide support - Continue analgesics - Outpatient follow-up with Dr. Malcolm in 2 weeks after discharge recommended. - Patient only interested in home health services for PT at discharge.  Expressed that he lives with his wife and son who can provide assistance in  care.  2-chronic respiratory failure with hypoxia/aspiration pneumonia - Continue the use of 2 L nasal cannula supplementation on as-needed basis as previously recommended. - Continue treatment with Omnicef  - Presentation and findings on x-ray suggesting aspiration pneumonia - Appreciate assistance and recommendation by HP therapy - Continue modified diet consistency - Flutter valve/incentive spirometer mucolytic's will be provided - Continue supportive care and follow clinical response.  3-presumed UTI - Patient denies dysuria - Current antibiotic for pneumonia will also cover and send the urine - Follow culture result - Maintain adequate hydration.  4-history of coronary artery disease/hypertension - No chest pain - Continue Plavix , Imdur , Ranexa  and statin. -No beta-blocker in the setting of baseline bradycardia - Continue patient follow-up with cardiology service.  5-chronic pain syndrome - Continue home analgesic therapy.  6-Alzheimer disease - Continue Namenda  - Continue the use of as needed Seroquel  at bedtime - Concern orientation and supportive care to be provided - Outpatient goals of care discussion/advance care planning and possible involvement of palliative care services recommended.  7-hypokalemia - Continue electrolyte repletion and follow trend.  Subjective:  Afebrile, no chest pain, following commands appropriately; good saturation on 2 L.  Reports feeling better.  No significant back pain while resting.  Physical Exam: Vitals:   08/25/24 0303 08/25/24 0756 08/25/24 1235 08/25/24 1410  BP: (!) 140/52 (!) 142/64 (!) 101/44 (!) 115/50  Pulse:  (!) 59 74 72  Resp: 18  20   Temp: 97.9 F (36.6 C) 97.6 F (36.4 C) 97.6 F (36.4 C)   TempSrc: Oral Axillary Oral   SpO2: 97%  96%   Weight:  Height:       General exam: Alert, awake, following commands and answering questions appropriately.  In no acute distress. Respiratory system: Positive scattered  rhonchi appreciated on exam; not using accessory muscle.  Catheterization on 2 L supplementation. Cardiovascular system:RRR.  No rubs, no gallops, no JVD. Gastrointestinal system: Abdomen is nondistended, soft and nontender. No organomegaly or masses felt. Normal bowel sounds heard. Central nervous system: Moving 4 limbs spontaneously.  No focal neurological deficits. Extremities: No cyanosis or clubbing. Skin: No petechiae. Psychiatry: Flat affect appreciated on exam.   Data Reviewed: Basic metabolic panel: Sodium 137, potassium 3.5, chloride 101, bicarb 28, BUN 16, creatinine 0.84 and creatinine >60 CBC: WBCs 12.6, hemoglobin 11.3 and platelet count 225K  Family Communication: No family at bedside.  Disposition: Status is: Inpatient Remains inpatient appropriate because: Continue IV antibiotics.  Anticipating discharge back home with home health services Once medically stable.  Time spent: 50 minutes  Author: Eric Nunnery, MD 08/25/2024 6:17 PM  For on call review [email or web address].

## 2024-08-25 NOTE — Plan of Care (Signed)
   Problem: Education: Goal: Knowledge of General Education information will improve Description: Including pain rating scale, medication(s)/side effects and non-pharmacologic comfort measures Outcome: Progressing   Problem: Health Behavior/Discharge Planning: Goal: Ability to manage health-related needs will improve Outcome: Progressing   Problem: Clinical Measurements: Goal: Will remain free from infection Outcome: Progressing

## 2024-08-25 NOTE — Plan of Care (Signed)
 Plan of care is reviewed. Pt has been progressing. He is alert  and fully oriented x 4,  stable hemodynamically, afebrile, normal respiratory effort, no acute distress noted. We will continue to monitor.   Problem: Clinical Measurements: Goal: Ability to maintain clinical measurements within normal limits will improve Outcome: Progressing Goal: Will remain free from infection Outcome: Progressing Goal: Diagnostic test results will improve Outcome: Progressing Goal: Respiratory complications will improve Outcome: Progressing Goal: Cardiovascular complication will be avoided Outcome: Progressing   Problem: Activity: Goal: Risk for activity intolerance will decrease Outcome: Progressing   Problem: Elimination: Goal: Will not experience complications related to bowel motility Outcome: Progressing Goal: Will not experience complications related to urinary retention Outcome: Progressing   Problem: Safety: Goal: Ability to remain free from injury will improve Outcome: Progressing   Problem: Activity: Goal: Ability to tolerate increased activity will improve Outcome: Progressing   Problem: Respiratory: Goal: Ability to maintain adequate ventilation will improve Outcome: Progressing Goal: Ability to maintain a clear airway will improve Outcome: Progressing   Wendi Dash, RN

## 2024-08-26 DIAGNOSIS — S22089A Unspecified fracture of T11-T12 vertebra, initial encounter for closed fracture: Secondary | ICD-10-CM

## 2024-08-26 DIAGNOSIS — F0390 Unspecified dementia without behavioral disturbance: Secondary | ICD-10-CM | POA: Diagnosis not present

## 2024-08-26 DIAGNOSIS — J189 Pneumonia, unspecified organism: Secondary | ICD-10-CM | POA: Diagnosis not present

## 2024-08-26 DIAGNOSIS — S2232XA Fracture of one rib, left side, initial encounter for closed fracture: Secondary | ICD-10-CM

## 2024-08-26 DIAGNOSIS — E039 Hypothyroidism, unspecified: Secondary | ICD-10-CM

## 2024-08-26 MED ORDER — AMOXICILLIN-POT CLAVULANATE 875-125 MG PO TABS: 1.0000 | ORAL_TABLET | Freq: Two times a day (BID) | ORAL | 0 refills | Status: AC

## 2024-08-26 MED ORDER — OYSTER SHELL CALCIUM/D3 500-5 MG-MCG PO TABS
1.0000 | ORAL_TABLET | Freq: Two times a day (BID) | ORAL | 1 refills | Status: AC
Start: 2024-08-26 — End: ?

## 2024-08-26 MED ORDER — OXYCODONE HCL 30 MG PO TABS: 30.0000 mg | ORAL_TABLET | Freq: Four times a day (QID) | ORAL | 0 refills | Status: DC | PRN

## 2024-08-26 MED ORDER — ACETAMINOPHEN 500 MG PO TABS: 1000.0000 mg | ORAL_TABLET | Freq: Three times a day (TID) | ORAL | Status: AC

## 2024-08-26 MED ORDER — AMOXICILLIN-POT CLAVULANATE 875-125 MG PO TABS
1.0000 | ORAL_TABLET | Freq: Two times a day (BID) | ORAL | Status: DC
  Filled 2024-08-26: qty 1

## 2024-08-26 NOTE — TOC Transition Note (Signed)
 Transition of Care [facility]) - Discharge Note   Patient Details  Name: Dustin [name] MRN: [id number] Date of Birth: 04-04-1934  [facility] CM/SW Contact:  Nena LITTIE Coffee, RN Phone Number: 08/26/2024, 6:18 PM   Clinical Narrative:    Pt d/c'd home c/wife and supportive family nearby. [facility] to provide RN, PT and aide.    Final next level of care: Home w Home Health Services Barriers to Discharge: Barriers Resolved   Patient Goals and CMS Choice            Discharge Placement                       Discharge Plan and Services Additional resources added to the After Visit Summary for                            HH Arranged: RN, PT, Nurse's Aide [facility] Agency: [facility] Date [facility] Agency Contacted: 08/26/24   Representative spoke with at [facility] Agency: Darleene  Social Drivers of Health (SDOH) Interventions SDOH Screenings   Food Insecurity: No Food Insecurity (08/24/2024)  Housing: Low Risk  (08/24/2024)  Transportation Needs: No Transportation Needs (08/24/2024)  Utilities: Not At Risk (08/24/2024)  Depression (PHQ2-9): Low Risk  (04/05/2023)  Social Connections: Moderately Isolated (08/24/2024)  Tobacco Use: Medium Risk (08/24/2024)     Readmission Risk Interventions    02/23/2024    1:51 PM 02/22/2024   12:43 PM 07/30/2023   11:01 AM  Readmission Risk Prevention Plan  Transportation Screening Complete Complete Complete  PCP or Specialist Appt within 3-5 Days  Not Complete   HRI or Home Care Consult Complete Complete   Social Work Consult for Recovery Care Planning/Counseling Complete    Palliative Care Screening Not Applicable Not Applicable   Medication Review Oceanographer) Complete Complete Complete  PCP or Specialist appointment within 3-5 days of discharge   Complete  HRI or Home Care Consult   Complete  SW Recovery Care/Counseling Consult   Complete  Palliative Care Screening   Not Applicable  Skilled Nursing Facility   Not  Applicable

## 2024-08-26 NOTE — Discharge Summary (Signed)
 Physician Discharge Summary   Patient: Dustin [name] MRN: [id number] DOB: 1934-02-01  Admit date:     08/24/2024  Discharge date: 08/26/24  Discharge Physician: Eric Nunnery   PCP: Bertell Satterfield, MD   Recommendations at discharge:  Goals of care discussion and advance care planning recommended; given ongoing progression of patient's underlying dementia involvement of palliative care will be a great addition into his care. Repeat basic metabolic panel to follow electrolytes and renal function Repeat CBC to follow hemoglobin trend/stability Repeat chest x-ray in 6-8 weeks to assure resolution of infiltrates.  Discharge Diagnoses: Principal Problem:   Multifocal pneumonia Active Problems:   OSA (obstructive sleep apnea)   Obesity, Class I, BMI 30-34.9   Left-sided weakness   Hypothyroidism   Dementia (HCC)   Chronic diastolic CHF (congestive heart failure) (HCC)   Closed fracture of twelfth thoracic vertebra (HCC)   Closed fracture of one rib of left side  Brief hospital admission narrative: As per H&P written by Dr. Sherlon on 08/24/2024 Dustin [name]  is a 89 y.o. male, with medical history significant for past medical history   of Alzheimer's disease, carotid artery disease, coronary artery disease (Multivessel s/p CABG 1996. b. DESx2 to SVG-PDA in 2012 c. DES to distal LM in 11/2019 d. cath in 02/2021 showing patent LM stent, patent LIMA-LAD, patent SVG-OM1 with chronically occluded jump limb to OM2 and chronic occlusion of SVG-D2 and SVG-PDA and no targets for intervention), hypothyroidism, hypertension, OSA, hypothyroidism, HLD, depression, history of left lung radiation therapy, with chronic hypoxic respiratory failure but typically uses only 2 L of oxygen  at night . - Patient presents to ED secondary to dyspnea, he is demented, very poor historian, so history was obtained from wife by phone and ED staff, he is with progressive dementia per his wife, he had fall last week,  followed by back pain, he is with nausea, vomiting today, as well cough and shortness of breath, he was seen by urgent care was instructed to come to ED for further evaluation. - In ED he was on 2 L oxygen , CT chest significant for bibasilar pneumonia, most likely aspiration, as well he had positive urine analysis, and leukocytosis at 16, so [name] hospitalist consulted to admit.  Assessment and Plan: 1-fall/deconditioning and T12 compression fracture - Per neurosurgery evaluation no surgical intervention needed - TLSO brace to be used while out of bed to provide support - Continue analgesics - Outpatient follow-up with Dr. Malcolm in 2 weeks after discharge recommended. - Patient only interested in home health services for PT at discharge.  Expressed that he lives with his wife and son who can provide assistance in care.   2-chronic respiratory failure with hypoxia/aspiration pneumonia - Continue the use of 2 L nasal cannula supplementation on as-needed basis as previously recommended. - Continue treatment with Omnicef  - Presentation and findings on x-ray suggesting aspiration pneumonia - Appreciate assistance and recommendation by speech therapy; will continue modified diet consistency (dysphagia 2 diet with thin liquids). - Flutter valve/incentive spirometer mucolytic's has been recommended. - Continue supportive care and repeat chest x-ray in 6-8 weeks to assure resolution of infiltrates.   3-presumed UTI - Patient denies dysuria and there is no hematuria on exam. - Current antibiotic for pneumonia will also cover and send the urine - Continue to maintain adequate hydration - Continue supportive care.   4-history of coronary artery disease/hypertension - No chest pain - Continue Plavix , Imdur , Ranexa  and statin. -No beta-blocker in the setting of baseline bradycardia -  Continue patient follow-up with cardiology service.   5-chronic pain syndrome - Continue home analgesic therapy. -3  times a day Tylenol  recommended to assist with ongoing discomfort and adjuvant treatment for new compression fracture.   6-Alzheimer disease - Continue Namenda  - Continue the use of as needed Seroquel  at bedtime - Concern orientation and supportive care to be provided - Outpatient goals of care discussion/advance care planning and possible involvement of palliative care services recommended.   7-hypokalemia - Electrolyte has been repleted and stable at discharge - Resume home daily supplementation.  Consultants: Neurosurgery Procedures performed: See below for x-ray reports. Disposition: Home with home health services. Diet recommendation: Heart healthy/low-sodium diet; dysphagia to within liquids.  DISCHARGE MEDICATION: Allergies as of 08/26/2024   No Known Allergies      Medication List     TAKE these medications    acetaminophen  500 MG tablet Commonly known as: TYLENOL  Take 2 tablets (1,000 mg total) by mouth in the morning, at noon, and at bedtime. Take medication as scheduled for the next 7 days and after that every 8 hours as needed for mild to moderate pain.   albuterol  (2.5 MG/3ML) 0.083% nebulizer solution Commonly known as: PROVENTIL  Take 3 mLs (2.5 mg total) by nebulization every 2 (two) hours as needed for wheezing or shortness of breath.   albuterol  108 (90 Base) MCG/ACT inhaler Commonly known as: VENTOLIN  HFA Inhale 2 puffs into the lungs every 6 (six) hours as needed for wheezing or shortness of breath.   alfuzosin  10 MG 24 hr tablet Commonly known as: UROXATRAL  Take 10 mg by mouth every morning.   amoxicillin -clavulanate 875-125 MG tablet Commonly known as: AUGMENTIN  Take 1 tablet by mouth 2 (two) times daily for 6 days.   calcium -vitamin D  500-5 MG-MCG tablet Commonly known as: OSCAL WITH D Take 1 tablet by mouth 2 (two) times daily with a meal.   clopidogrel  75 MG tablet Commonly known as: PLAVIX  Take 75 mg by mouth daily.   docusate sodium  100  MG capsule Commonly known as: COLACE Take 100 mg by mouth daily as needed for mild constipation.   DULoxetine  30 MG capsule Commonly known as: CYMBALTA  Take 30 mg by mouth daily.   furosemide  20 MG tablet Commonly known as: LASIX  Take 1 tablet (20 mg total) by mouth 2 (two) times daily. For fluid   gabapentin  100 MG capsule Commonly known as: NEURONTIN  Take 100 mg by mouth 3 (three) times daily.   isosorbide  mononitrate 120 MG 24 hr tablet Commonly known as: IMDUR  Take 1 tablet (120 mg total) by mouth every morning.   levothyroxine  75 MCG tablet Commonly known as: SYNTHROID  Take 75 mcg by mouth daily.   linaclotide  145 MCG Caps capsule Commonly known as: Linzess  Take 1 capsule (145 mcg total) by mouth daily before breakfast.   memantine  10 MG tablet Commonly known as: NAMENDA  Take 10 mg by mouth 2 (two) times daily.   nitroGLYCERIN  0.4 MG SL tablet Commonly known as: NITROSTAT  Place 1 tablet (0.4 mg total) under the tongue every 5 (five) minutes as needed for chest pain.   oxycodone  30 MG immediate release tablet Commonly known as: ROXICODONE  Take 1 tablet (30 mg total) by mouth every 6 (six) hours as needed (Severe pain). May take one additional tablet as needed for pain What changed:  when to take this reasons to take this   OXYGEN  Inhale 2 L into the lungs at bedtime.   pantoprazole  40 MG tablet Commonly known as: Protonix  Take  1 tablet (40 mg total) by mouth daily.   potassium chloride  SA 20 MEQ tablet Commonly known as: KLOR-CON  M Take 20 mEq by mouth daily.   QUEtiapine  25 MG tablet Commonly known as: SEROQUEL  Take 25 mg by mouth at bedtime as needed.   ranolazine  500 MG 12 hr tablet Commonly known as: RANEXA  Take 500 mg by mouth 2 (two) times daily.   risperiDONE  0.25 MG tablet Commonly known as: RISPERDAL  Take 0.25 mg by mouth at bedtime.   rosuvastatin  40 MG tablet Commonly known as: CRESTOR  Take 1 tablet (40 mg total) by mouth daily at 6  PM.        Follow-up Information     Louis Shove, MD. Schedule an appointment as soon as possible for a visit in 2 week(s).   Specialty: Neurosurgery Contact information: [address] [place] [address] [phone]         Bertell Satterfield, MD. Schedule an appointment as soon as possible for a visit in 10 day(s).   Specialty: Internal Medicine Contact information: [address] [place] [address] [phone]                Discharge Exam: Fredricka Weights   08/24/24 1225  Weight: 93 kg    General exam: Alert, awake, following commands and answering questions appropriately.  In no acute distress. Respiratory system: Positive scattered rhonchi appreciated on exam; not using accessory muscle.  Catheterization on 2 L supplementation. Cardiovascular system:RRR.  No rubs, no gallops, no JVD. Gastrointestinal system: Abdomen is nondistended, soft and nontender. No organomegaly or masses felt. Normal bowel sounds heard. Central nervous system: Moving 4 limbs spontaneously.  No focal neurological deficits. Extremities: No cyanosis or clubbing. Skin: No petechiae. Psychiatry: Flat affect appreciated on exam.  Condition at discharge: Stable and improved.  The results of significant diagnostics from this hospitalization (including imaging, microbiology, ancillary and laboratory) are listed below for reference.   Imaging Studies: CT Head Wo Contrast Result Date: 08/24/2024 CLINICAL DATA:  Recent head trauma, fall, headache EXAM: CT HEAD WITHOUT CONTRAST TECHNIQUE: Contiguous axial images were obtained from the base of the skull through the vertex without intravenous contrast. RADIATION DOSE REDUCTION: This exam was performed according to the departmental dose-optimization program which includes automated exposure control, adjustment of the mA and/or kV according to patient size and/or use of iterative reconstruction technique. COMPARISON:   11/23/2022 FINDINGS: Brain: Stable diffuse brain atrophy pattern without acute intracranial hemorrhage, mass lesion, infarction, shift, herniation hydrocephalus or extra-axial fluid collection. No focal mass effect or edema cisterns are patent. Cerebellar atrophy as well. Vascular: No hyperdense vessel or unexpected calcification. Skull: Normal. Negative for fracture or focal lesion. Sinuses/Orbits: No acute finding. Other: None. IMPRESSION: Stable atrophy pattern. No acute intracranial abnormality by noncontrast CT. Electronically Signed   By: CHRISTELLA.  Shick M.D.   On: 08/24/2024 18:27   CT ABDOMEN PELVIS W CONTRAST Result Date: 08/24/2024 CLINICAL DATA:  Acute generalized abdominal pain. EXAM: CT ABDOMEN AND PELVIS WITH CONTRAST TECHNIQUE: Multidetector CT imaging of the abdomen and pelvis was performed using the standard protocol following bolus administration of intravenous contrast. RADIATION DOSE REDUCTION: This exam was performed according to the departmental dose-optimization program which includes automated exposure control, adjustment of the mA and/or kV according to patient size and/or use of iterative reconstruction technique. CONTRAST:  OMNIPAQUE  IOHEXOL  350 MG/ML SOLN COMPARISON:  May 17, 2024. FINDINGS: Lower chest: Small left pleural effusion is noted with minimal adjacent subsegmental atelectasis. Mild patchy  opacities are noted in right lower lobe concerning for pneumonia. Hepatobiliary: Minimal cholelithiasis. No biliary dilatation. Liver is unremarkable. Pancreas: Unremarkable. No pancreatic ductal dilatation or surrounding inflammatory changes. Spleen: Normal in size without focal abnormality. Adrenals/Urinary Tract: Adrenal glands appear normal. Bilateral renal cysts are noted. No hydronephrosis or renal obstruction is noted. Urinary bladder is unremarkable. Stomach/Bowel: Stomach is within normal limits. Appendix appears normal. No evidence of bowel wall thickening, distention, or  inflammatory changes. Sigmoid diverticulosis is noted without inflammation. Vascular/Lymphatic: Aortic atherosclerosis. No enlarged abdominal or pelvic lymph nodes. Reproductive: Prostate is unremarkable. Other: No abdominal wall hernia or abnormality. No abdominopelvic ascites. Musculoskeletal: Interval development of moderate compression deformity of T12 vertebral body concerning for acute to subacute fracture. IMPRESSION: 1. Moderate compression deformity of T12 vertebral body concerning for acute to subacute fracture. 2. Small left pleural effusion with minimal adjacent subsegmental atelectasis. 3. Mild patchy opacities are noted in right lower lobe concerning for pneumonia. 4. Minimal cholelithiasis. 5. Sigmoid diverticulosis without inflammation. 6. Aortic atherosclerosis. Aortic Atherosclerosis (ICD10-I70.0). Electronically Signed   By: Lynwood Landy Raddle M.D.   On: 08/24/2024 15:19   CT Angio Chest PE W/Cm &/Or Wo Cm Result Date: 08/24/2024 CLINICAL DATA:  Shortness of breath, cough. EXAM: CT ANGIOGRAPHY CHEST WITH CONTRAST TECHNIQUE: Multidetector CT imaging of the chest was performed using the standard protocol during bolus administration of intravenous contrast. Multiplanar CT image reconstructions and MIPs were obtained to evaluate the vascular anatomy. RADIATION DOSE REDUCTION: This exam was performed according to the departmental dose-optimization program which includes automated exposure control, adjustment of the mA and/or kV according to patient size and/or use of iterative reconstruction technique. CONTRAST:  OMNIPAQUE  IOHEXOL  350 MG/ML SOLN COMPARISON:  June 30, 2024. FINDINGS: Cardiovascular: Satisfactory opacification of the pulmonary arteries to the segmental level. No evidence of pulmonary embolism. Normal heart size. No pericardial effusion. Status post coronary artery bypass graft. Atherosclerosis of thoracic aorta without aneurysm or dissection. Mediastinum/Nodes: No enlarged  mediastinal, hilar, or axillary lymph nodes. Thyroid  gland, trachea, and esophagus demonstrate no significant findings. Lungs/Pleura: Small left pleural effusion is noted with minimal adjacent subsegmental atelectasis. New large left lower lobe airspace opacity is noted concerning for pneumonia. Patchy opacities are noted in right upper and lower lobes also concerning for pneumonia. Upper Abdomen: No acute abnormality. Musculoskeletal: New mildly displaced fracture is seen involving posterior portion of left fifth rib. Old left sixth rib fracture is noted. Review of the MIP images confirms the above findings. IMPRESSION: 1. No definite evidence of pulmonary embolus. 2. New large left lower lobe airspace opacity is noted concerning for pneumonia. Patchy airspace opacities are noted in right upper and lower lobes also concerning for multifocal pneumonia. 3. Small left pleural effusion is noted with minimal adjacent subsegmental atelectasis. 4. New mildly displaced left fifth rib fracture is noted. 5. Aortic atherosclerosis. Aortic Atherosclerosis (ICD10-I70.0). Electronically Signed   By: Lynwood Landy Raddle M.D.   On: 08/24/2024 15:01   DG Chest 2 View Result Date: 08/24/2024 CLINICAL DATA:  Cough, shortness of breath. EXAM: CHEST - 2 VIEW COMPARISON:  June 30, 2024. FINDINGS: Stable cardiomediastinal silhouette. Status post coronary artery bypass graft. Right lung is clear. Stable left perihilar density is noted which may represent radiation change, although underlying neoplasm cannot be excluded. Bony thorax is unremarkable. IMPRESSION: Stable left perihilar density is noted which may represent radiation change, although underlying neoplasm cannot be excluded. Electronically Signed   By: Lynwood Landy Raddle M.D.   On: 08/24/2024 13:13  ECHOCARDIOGRAM COMPLETE Result Date: 08/21/2024    ECHOCARDIOGRAM REPORT   Patient Name:   SHEMAR PLEMMONS Date of Exam: 08/21/2024 Medical Rec #:  [id number]       Height:       70.0 in  Accession #:    [id number]      Weight:       205.0 lb Date of Birth:  Feb 09, 1934       BSA:          2.109 m Patient Age:    89 years        BP:           114/62 mmHg Patient Gender: M               HR:           70 bpm. Exam Location:  [facility] Procedure: 2D Echo, Color Doppler and Cardiac Doppler (Both Spectral and Color            Flow Doppler were utilized during procedure). Indications:    murmur  History:        Patient has prior history of Echocardiogram examinations, most                 recent 11/23/2022. CHF, CAD; Risk Factors:Dyslipidemia,                 Hypertension and Sleep Apnea.  Sonographer:    Therisa Crouch Referring Phys: [id number] CADENCE H FURTH IMPRESSIONS  1. Left ventricular ejection fraction, by estimation, is 60 to 65%. The left ventricle has normal function. The left ventricle has no regional wall motion abnormalities. There is mild left ventricular hypertrophy. Left ventricular diastolic parameters are consistent with Grade I diastolic dysfunction (impaired relaxation).  2. Right ventricular systolic function is normal. The right ventricular size is normal. Tricuspid regurgitation signal is inadequate for assessing PA pressure.  3. The mitral valve is abnormal. Mild mitral valve regurgitation. No evidence of mitral stenosis.  4. The aortic valve has an indeterminant number of cusps. There is moderate calcification of the aortic valve. There is moderate thickening of the aortic valve. Aortic valve regurgitation is not visualized. Aortic valve sclerosis/calcification is present, without any evidence of aortic stenosis.  5. The inferior vena cava is normal in size with greater than 50% respiratory variability, suggesting right atrial pressure of 3 mmHg. FINDINGS  Left Ventricle: Left ventricular ejection fraction, by estimation, is 60 to 65%. The left ventricle has normal function. The left ventricle has no regional wall motion abnormalities. The left ventricular internal cavity size was  normal in size. There is  mild left ventricular hypertrophy. Left ventricular diastolic parameters are consistent with Grade I diastolic dysfunction (impaired relaxation). Normal left ventricular filling pressure. Right Ventricle: The right ventricular size is normal. Right vetricular wall thickness was not well visualized. Right ventricular systolic function is normal. Tricuspid regurgitation signal is inadequate for assessing PA pressure. Left Atrium: Left atrial size was normal in size. Right Atrium: Right atrial size was normal in size. Pericardium: There is no evidence of pericardial effusion. Mitral Valve: The mitral valve is abnormal. There is mild thickening of the mitral valve leaflet(s). There is mild calcification of the mitral valve leaflet(s). Mild mitral annular calcification. Mild mitral valve regurgitation. No evidence of mitral valve stenosis. Tricuspid Valve: The tricuspid valve is normal in structure. Tricuspid valve regurgitation is not demonstrated. No evidence of tricuspid stenosis. Aortic Valve: The aortic valve has an indeterminant number of cusps.  There is moderate calcification of the aortic valve. There is moderate thickening of the aortic valve. There is moderate aortic valve annular calcification. Aortic valve regurgitation is not visualized. Aortic valve sclerosis/calcification is present, without any evidence of aortic stenosis. Aortic valve mean gradient measures 7.0 mmHg. Aortic valve peak gradient measures 13.3 mmHg. Aortic valve area, by VTI measures 1.79 cm. Pulmonic Valve: The pulmonic valve was not well visualized. Pulmonic valve regurgitation is mild. No evidence of pulmonic stenosis. Aorta: The aortic root and ascending aorta are structurally normal, with no evidence of dilitation. Venous: The inferior vena cava is normal in size with greater than 50% respiratory variability, suggesting right atrial pressure of 3 mmHg. IAS/Shunts: No atrial level shunt detected by color flow  Doppler.  LEFT VENTRICLE PLAX 2D LVIDd:         5.50 cm      Diastology LVIDs:         3.40 cm      LV e' medial:    5.98 cm/s LV PW:         1.20 cm      LV E/e' medial:  12.1 LV IVS:        1.10 cm      LV e' lateral:   8.16 cm/s LVOT diam:     2.20 cm      LV E/e' lateral: 8.9 LV SV:         70 LV SV Index:   33 LVOT Area:     3.80 cm  LV Volumes (MOD) LV vol d, MOD A2C: 114.0 ml LV vol d, MOD A4C: 124.0 ml LV vol s, MOD A2C: 52.0 ml LV vol s, MOD A4C: 50.0 ml LV SV MOD A2C:     62.0 ml LV SV MOD A4C:     124.0 ml LV SV MOD BP:      67.4 ml RIGHT VENTRICLE            IVC RV Basal diam:  3.40 cm    IVC diam: 1.90 cm RV S prime:     9.90 cm/s TAPSE (M-mode): 2.0 cm LEFT ATRIUM             Index LA diam:        4.30 cm 2.04 cm/m LA Vol (A2C):   61.6 ml 29.20 ml/m LA Vol (A4C):   35.2 ml 16.69 ml/m LA Biplane Vol: 47.7 ml 22.61 ml/m  AORTIC VALVE AV Area (Vmax):    1.75 cm AV Area (Vmean):   1.71 cm AV Area (VTI):     1.79 cm AV Vmax:           182.50 cm/s AV Vmean:          121.500 cm/s AV VTI:            0.394 m AV Peak Grad:      13.3 mmHg AV Mean Grad:      7.0 mmHg LVOT Vmax:         84.15 cm/s LVOT Vmean:        54.500 cm/s LVOT VTI:          0.185 m LVOT/AV VTI ratio: 0.47  AORTA Ao Root diam: 3.50 cm Ao Asc diam:  2.50 cm MITRAL VALVE MV Area (PHT): 2.55 cm    SHUNTS MV Decel Time: 297 msec    Systemic VTI:  0.18 m MV E velocity: 72.30 cm/s  Systemic Diam: 2.20 cm MV A velocity: 89.60 cm/s MV  E/A ratio:  0.81 Dorn Ross MD Electronically signed by Dorn Ross MD Signature Date/Time: 08/21/2024/3:59:14 PM    Final     Microbiology: Results for orders placed or performed during the hospital encounter of 08/24/24  Resp panel by RT-PCR (RSV, Flu A&B, Covid) Anterior Nasal Swab     Status: None   Collection Time: 08/24/24  1:10 PM   Specimen: Anterior Nasal Swab  Result Value Ref Range Status   SARS Coronavirus 2 by RT PCR NEGATIVE NEGATIVE Final    Comment: (NOTE) SARS-CoV-2 target  nucleic acids are NOT DETECTED.  The SARS-CoV-2 RNA is generally detectable in upper respiratory specimens during the acute phase of infection. The lowest concentration of SARS-CoV-2 viral copies this assay can detect is 138 copies/mL. A negative result does not preclude SARS-Cov-2 infection and should not be used as the sole basis for treatment or other patient management decisions. A negative result may occur with  improper specimen collection/handling, submission of specimen other than nasopharyngeal swab, presence of viral mutation(s) within the areas targeted by this assay, and inadequate number of viral copies(<138 copies/mL). A negative result must be combined with clinical observations, patient history, and epidemiological information. The expected result is Negative.  Fact Sheet for Patients:  BloggerCourse.com  Fact Sheet for Healthcare Providers:  SeriousBroker.it  This test is no t yet approved or cleared by the [place]  FDA and  has been authorized for detection and/or diagnosis of SARS-CoV-2 by FDA under an Emergency Use Authorization (EUA). This EUA will remain  in effect (meaning this test can be used) for the duration of the COVID-19 declaration under Section 564(b)(1) of the Act, 21 U.S.C.section 360bbb-3(b)(1), unless the authorization is terminated  or revoked sooner.       Influenza A by PCR NEGATIVE NEGATIVE Final   Influenza B by PCR NEGATIVE NEGATIVE Final    Comment: (NOTE) The Xpert Xpress SARS-CoV-2/FLU/RSV plus assay is intended as an aid in the diagnosis of influenza from Nasopharyngeal swab specimens and should not be used as a sole basis for treatment. Nasal washings and aspirates are unacceptable for Xpert Xpress SARS-CoV-2/FLU/RSV testing.  Fact Sheet for Patients: BloggerCourse.com  Fact Sheet for Healthcare  Providers: SeriousBroker.it  This test is not yet approved or cleared by the [place]  FDA and has been authorized for detection and/or diagnosis of SARS-CoV-2 by FDA under an Emergency Use Authorization (EUA). This EUA will remain in effect (meaning this test can be used) for the duration of the COVID-19 declaration under Section 564(b)(1) of the Act, 21 U.S.C. section 360bbb-3(b)(1), unless the authorization is terminated or revoked.     Resp Syncytial Virus by PCR NEGATIVE NEGATIVE Final    Comment: (NOTE) Fact Sheet for Patients: BloggerCourse.com  Fact Sheet for Healthcare Providers: SeriousBroker.it  This test is not yet approved or cleared by the [place]  FDA and has been authorized for detection and/or diagnosis of SARS-CoV-2 by FDA under an Emergency Use Authorization (EUA). This EUA will remain in effect (meaning this test can be used) for the duration of the COVID-19 declaration under Section 564(b)(1) of the Act, 21 U.S.C. section 360bbb-3(b)(1), unless the authorization is terminated or revoked.  Performed at [facility], [address], [place], [place] [address]   Culture, blood (routine x 2)     Status: None (Preliminary result)   Collection Time: 08/24/24  1:46 PM   Specimen: BLOOD  Result Value Ref Range Status   Specimen Description BLOOD BLOOD RIGHT WRIST  Final  Special Requests   Final    BOTTLES DRAWN AEROBIC AND ANAEROBIC Blood Culture adequate volume   Culture   Final    NO GROWTH 2 DAYS Performed at [facility], [address], [place], [place] [address]    Report Status PENDING  Incomplete  Culture, blood (routine x 2)     Status: None (Preliminary result)   Collection Time: 08/24/24  1:46 PM   Specimen: BLOOD  Result Value Ref Range Status   Specimen Description BLOOD BLOOD LEFT HAND  Final   Special Requests   Final    BOTTLES DRAWN AEROBIC AND ANAEROBIC  Blood Culture adequate volume   Culture   Final    NO GROWTH 2 DAYS Performed at [facility], [address] [place], [place] [address]    Report Status PENDING  Incomplete    Labs: CBC: Recent Labs  Lab 08/24/24 1318 08/25/24 0339  WBC 16.2* 12.6*  NEUTROABS 13.9*  --   HGB 11.8* 11.3*  HCT 35.5* 34.8*  MCV 94.9 95.1  PLT 229 225   Basic Metabolic Panel: Recent Labs  Lab 08/24/24 1318 08/25/24 0339  NA 136 137  K 3.4* 3.5  CL 96* 101  CO2 26 28  GLUCOSE 134* 177*  BUN 16 16  CREATININE 1.04 0.84  CALCIUM  8.9 8.8*   Liver Function Tests: Recent Labs  Lab 08/24/24 1444  AST 16  ALT 10  ALKPHOS 114  BILITOT 1.0  PROT 6.0*  ALBUMIN 3.0*   CBG: No results for input(s): GLUCAP in the last 168 hours.  Discharge time spent: 35 minutes.  Signed: Eric Nunnery, MD [name] 08/26/2024

## 2024-08-27 LAB — LEGIONELLA PNEUMOPHILA SEROGP 1 UR AG: L. pneumophila Serogp 1 Ur Ag: NEGATIVE

## 2024-08-28 ENCOUNTER — Telehealth: Payer: Self-pay

## 2024-08-28 LAB — URINE CULTURE: Culture: >=100000 — AB

## 2024-08-28 NOTE — Transitions of Care (Post Inpatient/ED Visit) (Signed)
   08/28/2024  Name: Dustin [name] MRN: [id number] DOB: 1934/08/17  Today's TOC FU Call Status: Today's TOC FU Call Status:: Unsuccessful Call (1st Attempt) Unsuccessful Call (1st Attempt) Date: 08/28/24  Attempted to reach the patient regarding the most recent Inpatient/ED visit.  Follow Up Plan: Additional outreach attempts will be made to reach the patient to complete the Transitions of Care (Post Inpatient/ED visit) call.   Shona Prow RN, CCM [place]  [facility]BC[facility]-Population Health RN Care Manager [phone]

## 2024-08-29 ENCOUNTER — Telehealth: Payer: Self-pay

## 2024-08-29 DIAGNOSIS — A481 Legionnaires' disease: Secondary | ICD-10-CM | POA: Diagnosis not present

## 2024-08-29 DIAGNOSIS — S22080D Wedge compression fracture of T11-T12 vertebra, subsequent encounter for fracture with routine healing: Secondary | ICD-10-CM | POA: Diagnosis not present

## 2024-08-29 DIAGNOSIS — J154 Pneumonia due to other streptococci: Secondary | ICD-10-CM | POA: Diagnosis not present

## 2024-08-29 DIAGNOSIS — I5032 Chronic diastolic (congestive) heart failure: Secondary | ICD-10-CM | POA: Diagnosis not present

## 2024-08-29 DIAGNOSIS — N390 Urinary tract infection, site not specified: Secondary | ICD-10-CM | POA: Diagnosis not present

## 2024-08-29 DIAGNOSIS — I110 Hypertensive heart disease with heart failure: Secondary | ICD-10-CM | POA: Diagnosis not present

## 2024-08-29 DIAGNOSIS — S2242XD Multiple fractures of ribs, left side, subsequent encounter for fracture with routine healing: Secondary | ICD-10-CM | POA: Diagnosis not present

## 2024-08-29 DIAGNOSIS — F0284 Dementia in other diseases classified elsewhere, unspecified severity, with anxiety: Secondary | ICD-10-CM | POA: Diagnosis not present

## 2024-08-29 DIAGNOSIS — G309 Alzheimer's disease, unspecified: Secondary | ICD-10-CM | POA: Diagnosis not present

## 2024-08-29 LAB — CULTURE, BLOOD (ROUTINE X 2)
Culture: NO GROWTH
Culture: NO GROWTH
Special Requests: ADEQUATE
Special Requests: ADEQUATE

## 2024-08-29 NOTE — Transitions of Care (Post Inpatient/ED Visit) (Signed)
   08/29/2024  Name: Dustin [name] MRN: [id number] DOB: 1934/07/07  Today's TOC FU Call Status: Today's TOC FU Call Status:: Unsuccessful Call (2nd Attempt) Unsuccessful Call (2nd Attempt) Date: 08/29/24  Attempted to reach the patient regarding the most recent Inpatient/ED visit.  Follow Up Plan: Additional outreach attempts will be made to reach the patient to complete the Transitions of Care (Post Inpatient/ED visit) call.   Shona Prow RN, CCM [place]  VBCI-Population Health RN Care Manager [phone]

## 2024-08-30 ENCOUNTER — Telehealth: Payer: Self-pay

## 2024-08-30 DIAGNOSIS — I5032 Chronic diastolic (congestive) heart failure: Secondary | ICD-10-CM | POA: Diagnosis not present

## 2024-08-30 DIAGNOSIS — M48061 Spinal stenosis, lumbar region without neurogenic claudication: Secondary | ICD-10-CM | POA: Diagnosis not present

## 2024-08-30 DIAGNOSIS — G894 Chronic pain syndrome: Secondary | ICD-10-CM | POA: Diagnosis not present

## 2024-08-30 DIAGNOSIS — E663 Overweight: Secondary | ICD-10-CM | POA: Diagnosis not present

## 2024-08-30 DIAGNOSIS — J441 Chronic obstructive pulmonary disease with (acute) exacerbation: Secondary | ICD-10-CM | POA: Diagnosis not present

## 2024-08-30 DIAGNOSIS — M4302 Spondylolysis, cervical region: Secondary | ICD-10-CM | POA: Diagnosis not present

## 2024-08-30 DIAGNOSIS — Z6829 Body mass index (BMI) 29.0-29.9, adult: Secondary | ICD-10-CM | POA: Diagnosis not present

## 2024-08-30 DIAGNOSIS — J449 Chronic obstructive pulmonary disease, unspecified: Secondary | ICD-10-CM | POA: Diagnosis not present

## 2024-08-30 DIAGNOSIS — J189 Pneumonia, unspecified organism: Secondary | ICD-10-CM | POA: Diagnosis not present

## 2024-08-30 NOTE — Transitions of Care (Post Inpatient/ED Visit) (Signed)
   08/30/2024  Name: Dustin [name] MRN: [id number] DOB: 06-12-1934  Today's TOC FU Call Status: Today's TOC FU Call Status:: Unsuccessful Call (3rd Attempt) Unsuccessful Call (3rd Attempt) Date: 08/30/24  Attempted to reach the patient regarding the most recent Inpatient/ED visit.  Follow Up Plan: No further outreach attempts will be made at this time. We have been unable to contact the patient.  Alan Ee, RN, BSN, CEN [employer]- Transition of Care Team.  [facility] [phone]

## 2024-08-31 DIAGNOSIS — J154 Pneumonia due to other streptococci: Secondary | ICD-10-CM | POA: Diagnosis not present

## 2024-08-31 DIAGNOSIS — N390 Urinary tract infection, site not specified: Secondary | ICD-10-CM | POA: Diagnosis not present

## 2024-08-31 DIAGNOSIS — I5032 Chronic diastolic (congestive) heart failure: Secondary | ICD-10-CM | POA: Diagnosis not present

## 2024-08-31 DIAGNOSIS — G309 Alzheimer's disease, unspecified: Secondary | ICD-10-CM | POA: Diagnosis not present

## 2024-08-31 DIAGNOSIS — S22080D Wedge compression fracture of T11-T12 vertebra, subsequent encounter for fracture with routine healing: Secondary | ICD-10-CM | POA: Diagnosis not present

## 2024-08-31 DIAGNOSIS — I110 Hypertensive heart disease with heart failure: Secondary | ICD-10-CM | POA: Diagnosis not present

## 2024-08-31 DIAGNOSIS — S2242XD Multiple fractures of ribs, left side, subsequent encounter for fracture with routine healing: Secondary | ICD-10-CM | POA: Diagnosis not present

## 2024-08-31 DIAGNOSIS — F0284 Dementia in other diseases classified elsewhere, unspecified severity, with anxiety: Secondary | ICD-10-CM | POA: Diagnosis not present

## 2024-08-31 DIAGNOSIS — A481 Legionnaires' disease: Secondary | ICD-10-CM | POA: Diagnosis not present

## 2024-09-03 DIAGNOSIS — I110 Hypertensive heart disease with heart failure: Secondary | ICD-10-CM | POA: Diagnosis not present

## 2024-09-03 DIAGNOSIS — S22080D Wedge compression fracture of T11-T12 vertebra, subsequent encounter for fracture with routine healing: Secondary | ICD-10-CM | POA: Diagnosis not present

## 2024-09-03 DIAGNOSIS — I5032 Chronic diastolic (congestive) heart failure: Secondary | ICD-10-CM | POA: Diagnosis not present

## 2024-09-03 DIAGNOSIS — N390 Urinary tract infection, site not specified: Secondary | ICD-10-CM | POA: Diagnosis not present

## 2024-09-03 DIAGNOSIS — F0284 Dementia in other diseases classified elsewhere, unspecified severity, with anxiety: Secondary | ICD-10-CM | POA: Diagnosis not present

## 2024-09-03 DIAGNOSIS — J154 Pneumonia due to other streptococci: Secondary | ICD-10-CM | POA: Diagnosis not present

## 2024-09-03 DIAGNOSIS — S2242XD Multiple fractures of ribs, left side, subsequent encounter for fracture with routine healing: Secondary | ICD-10-CM | POA: Diagnosis not present

## 2024-09-03 DIAGNOSIS — A481 Legionnaires' disease: Secondary | ICD-10-CM | POA: Diagnosis not present

## 2024-09-03 DIAGNOSIS — G309 Alzheimer's disease, unspecified: Secondary | ICD-10-CM | POA: Diagnosis not present

## 2024-09-04 DIAGNOSIS — G309 Alzheimer's disease, unspecified: Secondary | ICD-10-CM | POA: Diagnosis not present

## 2024-09-04 DIAGNOSIS — N390 Urinary tract infection, site not specified: Secondary | ICD-10-CM | POA: Diagnosis not present

## 2024-09-04 DIAGNOSIS — S22080D Wedge compression fracture of T11-T12 vertebra, subsequent encounter for fracture with routine healing: Secondary | ICD-10-CM | POA: Diagnosis not present

## 2024-09-04 DIAGNOSIS — I110 Hypertensive heart disease with heart failure: Secondary | ICD-10-CM | POA: Diagnosis not present

## 2024-09-04 DIAGNOSIS — J154 Pneumonia due to other streptococci: Secondary | ICD-10-CM | POA: Diagnosis not present

## 2024-09-04 DIAGNOSIS — S2242XD Multiple fractures of ribs, left side, subsequent encounter for fracture with routine healing: Secondary | ICD-10-CM | POA: Diagnosis not present

## 2024-09-04 DIAGNOSIS — A481 Legionnaires' disease: Secondary | ICD-10-CM | POA: Diagnosis not present

## 2024-09-04 DIAGNOSIS — F0284 Dementia in other diseases classified elsewhere, unspecified severity, with anxiety: Secondary | ICD-10-CM | POA: Diagnosis not present

## 2024-09-04 DIAGNOSIS — I5032 Chronic diastolic (congestive) heart failure: Secondary | ICD-10-CM | POA: Diagnosis not present

## 2024-09-07 DIAGNOSIS — F0284 Dementia in other diseases classified elsewhere, unspecified severity, with anxiety: Secondary | ICD-10-CM | POA: Diagnosis not present

## 2024-09-07 DIAGNOSIS — G309 Alzheimer's disease, unspecified: Secondary | ICD-10-CM | POA: Diagnosis not present

## 2024-09-07 DIAGNOSIS — N390 Urinary tract infection, site not specified: Secondary | ICD-10-CM | POA: Diagnosis not present

## 2024-09-07 DIAGNOSIS — I110 Hypertensive heart disease with heart failure: Secondary | ICD-10-CM | POA: Diagnosis not present

## 2024-09-07 DIAGNOSIS — S22080D Wedge compression fracture of T11-T12 vertebra, subsequent encounter for fracture with routine healing: Secondary | ICD-10-CM | POA: Diagnosis not present

## 2024-09-07 DIAGNOSIS — I5032 Chronic diastolic (congestive) heart failure: Secondary | ICD-10-CM | POA: Diagnosis not present

## 2024-09-07 DIAGNOSIS — J154 Pneumonia due to other streptococci: Secondary | ICD-10-CM | POA: Diagnosis not present

## 2024-09-07 DIAGNOSIS — S2242XD Multiple fractures of ribs, left side, subsequent encounter for fracture with routine healing: Secondary | ICD-10-CM | POA: Diagnosis not present

## 2024-09-07 DIAGNOSIS — A481 Legionnaires' disease: Secondary | ICD-10-CM | POA: Diagnosis not present

## 2024-09-09 NOTE — Progress Notes (Signed)
 Dustin [name], male    DOB: [birth year]    MRN: [id number]  Brief patient profile:  30  yowm  quit smoking 1996 p MI  former [facility] patient self-referred back to pulmonary clinic in [facility]  04/16/2024  for cp/sob/ cough  - no pfts on file, no response to bronchodilators clinically   L CP onset a few weeks of finishing radiation/ just below L scapula more positional than pleuritic assoc with severe constipation (RT notes say pain reported same location p cabg = baseline, pt denies)   PET pos/ spn/ presumed ca  Indication for treatment: Curative       Radiation treatment dates: 04/26/23 through 05/02/23 Site/dose: Left Lung - 54 Gy delivered in 3 Fx at 18 Gy/Fx Beams/energy:  6X-FFF Technique/Mode: SBRT/SRT-IMRT, Photon    History of Present Illness  04/16/2024  Pulmonary/ 1st office eval/ Claritza July / [place] Office  Chief Complaint  Patient presents with   COPD        Shortness of Breath  Dyspnea:  riding scooter at grocery store x 6 m  Cough: none now / cp not worse diet cough / oxycodone  helps / has gabapentin  but only using 100 mg per day  Min production pale green  Sleep: 30 degrees HOB  SABA use: inhalers not much change in doe, helps him cough up mucus somewhat   02 use: 2lpm HS and prn Rec Pain pattern suggests ibs:   Treatment consists of avoiding foods that cause gas (especially boiled eggs, mexcican food but especially  beans and undercooked vegetables like  spinach and some salads)  and citrucel 1 heaping tbsp twice daily with a large glass of water .  Pain should improve w/in 2 weeks and if not then consider further GI work up.    Gabapentin  100 mg four times daily for now to see if it results in less needed for oxycodone  Zostrix cream can be applied just on the area of the pain 4 x daily  Make sure you check your oxygen  saturation  AT  your highest level of activity (not after you stop)   to be sure it stays over 90%  Please schedule a follow up office visit in 6  weeks, call sooner if needed      05/30/2024  f/u ov/[place] office/Liberta Gimpel re:  CP  ? IBS / ? Neuralgia post cabg  maint on can't  say but cp resolved to his satisfaction  Chief Complaint  Patient presents with   COPD   Shortness of Breath   Dyspnea:  has not walked to mb and back in a month  - limited intermittent by L groin pain (worse when walks)  Cough: none  Sleeping: recliner x 5 year less 30 degrees  s  resp cc  SABA use: I got 3 or 4 not using 02: 2lpm hs only not using daytime Rec Continue 02 2lpm at bedtime  If you feel you are being held back from doing what you want due to your breathing, please return with all your breathing medicines in hand I will see what I can do to help     09/11/2024  f/u ov/[place] office/Vivian Neuwirth re:  CP  ? IBS / ? Neuralgia post cabg maint on prn saba  s/p RT to   [place]st completed 10/2023  Chief Complaint  Patient presents with   Lung Cancer    Shob - coughing - cancer found in lung   Dyspnea: Still able to walk to mb and back  uphill to house slow pace limited by painful neuropathy both legs Cough: grey mucus, no blood  Sleeping: recliner x 30 degrees and no   resp cc  SABA use: seem to help but none prior to exertion  02: 2lpm hs    No obvious day to day or daytime variability or assoc excess/ purulent sputum or mucus plugs or hemoptysis or cp or chest tightness, subjective wheeze or overt sinus or hb symptoms.    Also denies any obvious fluctuation of symptoms with weather or environmental changes or other aggravating or alleviating factors except as outlined above   No unusual exposure hx or h/o childhood pna/ asthma or knowledge of premature birth.  Current Allergies, Complete Past Medical History, Past Surgical History, Family History, and Social History were reviewed in [employer] record.  ROS  The following are not active complaints unless bolded Hoarseness, sore throat, dysphagia, dental problems,  itching, sneezing,  nasal congestion or discharge of excess mucus or purulent secretions, ear ache,   fever, chills, sweats, unintended wt loss or wt gain, classically pleuritic or exertional cp,  orthopnea pnd or arm/hand swelling  or leg swelling, presyncope, palpitations, abdominal pain, anorexia, nausea, vomiting, diarrhea  or change in bowel habits or change in bladder habits, change in stools or change in urine, dysuria, hematuria,  rash, arthralgias, visual complaints, headache, numbness, weakness or ataxia or problems with walking or coordination,  change in mood or  memory.          Not able to confirm meds    Past Medical History:  Diagnosis Date   Alzheimer disease (HCC)    Anxiety    Arthritis    Atrophic gastritis    a. By EGD 02/2013.   Carotid artery disease (HCC)    a. mild-mod plaque, <50% stenosis bilat by duplex 2018.   Coronary atherosclerosis of native coronary artery    a. Multivessel s/p CABG 1996. b. DESx2 to SVG-PDA in 2012 c. DES to distal LM in 11/2019 d. cath in 02/2021 showing patent LM stent, patent LIMA-LAD, patent SVG-OM1 with chronically occluded jump limb to OM2 and chronic occlusion of SVG-D2 and SVG-PDA and no targets for intervention   DDD (degenerative disc disease)    Chronic back pain   Dementia (HCC)    Enlarged prostate    Essential hypertension    Hematuria    History of radiation therapy    Left Lung- 04/26/23-Dr. Lynwood Nasuti   Hypothyroidism    LBBB (left bundle branch block)    MI (myocardial infarction) (HCC)    Mixed hyperlipidemia    OA (osteoarthritis)    OSA (obstructive sleep apnea)    Pneumonia due to COVID-19 virus    February 2021   Sinus bradycardia    a. Aricept  and BB discontinued due to this.      Objective:    Wts  09/11/2024       201   05/30/24 216 lb (98 kg)  05/23/24 210 lb 6.4 oz (95.4 kg)  05/22/24 210 lb 6.4 oz (95.4 kg)    Vital signs reviewed  09/11/2024  - Note at rest 02 sats  96% on RA   General  appearance:   somber and appears  very frail. Awkward gait/ min pseudowheeze resolves with plb    HEENT : Oropharynx  clear   Nasal turbinates nl / very poor hearing    NECK :  without  apparent JVD/ palpable Nodes/TM    LUNGS: no  acc muscle use,  Min barrel  contour chest wall with bilateral  slightly decreased bs s audible wheeze and  without cough on insp or exp maneuvers and min  Hyperresonant  to  percussion bilaterally    CV:  RRR  no s3 or murmur or increase in P2, and no edema   ABD:  soft and nontender    MS:  Nl gait/ ext warm without deformities Or obvious joint restrictions  calf tenderness, cyanosis or clubbing     SKIN: warm and dry without lesions    NEURO:  alert, approp, nl sensorium with  no motor or cerebellar deficits apparent.          CXR PA and Lateral:   09/11/2024 :    I personally reviewed images and impression is as follows:     C/w post RT scarring           Assessment    Assessment & Plan DOE (dyspnea on exertion) Initial pulmonary eval 04/16/24  - 09/11/2024   Walked on RA  x  2  lap(s) =  approx 300  ft  @ mod pace, stopped due to sob  with lowest 02 sats 92%    Cxr is as expected s/p RT and pt appears more limited by fatigue and geriatric decline than ILD or hypoxemia so f/u can be conservative here with just prn saba:  Re SABA :  I spent extra time with pt today reviewing appropriate use of albuterol  for prn use on exertion with the following points: 1) saba is for relief of sob that does not improve by walking a slower pace or resting but rather if the pt does not improve after trying this first. 2) If the pt is convinced, as many are, that saba helps recover from activity faster then it's easy to tell if this is the case by re-challenging : ie stop, take the inhaler, then p 5 minutes try the exact same activity (intensity of workload) that just caused the symptoms and see if they are substantially diminished or not after saba 3) if there is an  activity that reproducibly causes the symptoms, try the saba 15 min before the activity on alternate days   If in fact the saba really does help, then fine to continue to use it prn but advised may need to look closer at the maintenance regimen (for now = 0) being used to achieve better control of airways disease with exertion.   Discussed in detail all the  indications, usual  risks and alternatives  relative to the benefits with patient who agrees to proceed with Rx as outlined.       Chronic resp failure with hypoxia Rx as of 09/11/2024    = 2lpm hs and prn daytime  - 05/30/2024   Walked on RA  x  2 lap(s) =  approx 300  ft  @ mod pace, stopped due to groin pain with lowest 02 sats 89% s sob   -  09/11/2024 note no desaturation  x 300 ft slow pace > note improvement in walking sats on RA  > rec wear 02 2lpm hs and prn daytime with goal of continuing to keep sats > 90% at all times. F/u can be prn at this point.  Each maintenance medication was reviewed in detail including emphasizing most importantly the difference between maintenance and prns and under what circumstances the prns are to be triggered using an action plan format where appropriate.  Total  time for H and P, chart review, counseling, reviewing hfa device(s) , directly observing portions of ambulatory 02 saturation study/ and generating customized AVS unique to this office visit / same day charting = 30 min summary final f/u ov                AVS  Patient Instructions  Please remember to go to the  x-ray department  @  [facility] for your tests - we will call you with the results when they are available     Ok to try albuterol  15 min before an activity (on alternating days)  that you know would usually make you short of breath and see if it makes any difference and if makes none then don't take albuterol  after activity unless you can't catch your breath as this means it's the resting that helps, not the  albuterol .  Pulmonary follow up is as needed for new breathing problems or cough.       Ozell America, MD 09/11/2024

## 2024-09-10 ENCOUNTER — Telehealth: Payer: Self-pay

## 2024-09-10 DIAGNOSIS — F0284 Dementia in other diseases classified elsewhere, unspecified severity, with anxiety: Secondary | ICD-10-CM | POA: Diagnosis not present

## 2024-09-10 DIAGNOSIS — G309 Alzheimer's disease, unspecified: Secondary | ICD-10-CM | POA: Diagnosis not present

## 2024-09-10 DIAGNOSIS — S2242XD Multiple fractures of ribs, left side, subsequent encounter for fracture with routine healing: Secondary | ICD-10-CM | POA: Diagnosis not present

## 2024-09-10 DIAGNOSIS — A481 Legionnaires' disease: Secondary | ICD-10-CM | POA: Diagnosis not present

## 2024-09-10 DIAGNOSIS — I5032 Chronic diastolic (congestive) heart failure: Secondary | ICD-10-CM | POA: Diagnosis not present

## 2024-09-10 DIAGNOSIS — J154 Pneumonia due to other streptococci: Secondary | ICD-10-CM | POA: Diagnosis not present

## 2024-09-10 DIAGNOSIS — I110 Hypertensive heart disease with heart failure: Secondary | ICD-10-CM | POA: Diagnosis not present

## 2024-09-10 DIAGNOSIS — S22080D Wedge compression fracture of T11-T12 vertebra, subsequent encounter for fracture with routine healing: Secondary | ICD-10-CM | POA: Diagnosis not present

## 2024-09-10 DIAGNOSIS — N390 Urinary tract infection, site not specified: Secondary | ICD-10-CM | POA: Diagnosis not present

## 2024-09-10 NOTE — Telephone Encounter (Signed)
 Added to a waiting list.

## 2024-09-10 NOTE — Telephone Encounter (Signed)
 Copied from CRM [id number]. Topic: Clinical - Home Health Verbal Orders >> Sep 10, 2024  1:26 PM Delon DASEN wrote: Caller/Agency: Andrez with [facility] Callback Number: [phone] or patient's wife Blima [phone] Service Requested: Physical Therapy and nursing Frequency: n/a Any new concerns about the patient? Yes- need to be established sooner or will have to discharge patient

## 2024-09-11 ENCOUNTER — Ambulatory Visit: Admitting: Internal Medicine

## 2024-09-11 ENCOUNTER — Encounter: Payer: Self-pay | Admitting: Internal Medicine

## 2024-09-11 ENCOUNTER — Ambulatory Visit (HOSPITAL_COMMUNITY)
Admission: RE | Admit: 2024-09-11 | Discharge: 2024-09-11 | Disposition: A | Discharge status: Home / Self Care | Source: Ambulatory Visit | Attending: Internal Medicine | Admitting: Internal Medicine

## 2024-09-11 VITALS — BP 97/60 | HR 68 | Ht 70.0 in | Wt 201.0 lb

## 2024-09-11 DIAGNOSIS — R059 Cough, unspecified: Secondary | ICD-10-CM | POA: Diagnosis not present

## 2024-09-11 DIAGNOSIS — S2242XD Multiple fractures of ribs, left side, subsequent encounter for fracture with routine healing: Secondary | ICD-10-CM | POA: Diagnosis not present

## 2024-09-11 DIAGNOSIS — I2510 Atherosclerotic heart disease of native coronary artery without angina pectoris: Secondary | ICD-10-CM | POA: Diagnosis not present

## 2024-09-11 DIAGNOSIS — Z87891 Personal history of nicotine dependence: Secondary | ICD-10-CM | POA: Diagnosis not present

## 2024-09-11 DIAGNOSIS — J9611 Chronic respiratory failure with hypoxia: Secondary | ICD-10-CM | POA: Diagnosis not present

## 2024-09-11 DIAGNOSIS — R0609 Other forms of dyspnea: Secondary | ICD-10-CM | POA: Diagnosis not present

## 2024-09-11 DIAGNOSIS — A481 Legionnaires' disease: Secondary | ICD-10-CM | POA: Diagnosis not present

## 2024-09-11 DIAGNOSIS — S22080D Wedge compression fracture of T11-T12 vertebra, subsequent encounter for fracture with routine healing: Secondary | ICD-10-CM | POA: Diagnosis not present

## 2024-09-11 DIAGNOSIS — C3492 Malignant neoplasm of unspecified part of left bronchus or lung: Secondary | ICD-10-CM | POA: Diagnosis not present

## 2024-09-11 DIAGNOSIS — N390 Urinary tract infection, site not specified: Secondary | ICD-10-CM | POA: Diagnosis not present

## 2024-09-11 DIAGNOSIS — G8929 Other chronic pain: Secondary | ICD-10-CM

## 2024-09-11 DIAGNOSIS — R0602 Shortness of breath: Secondary | ICD-10-CM | POA: Diagnosis not present

## 2024-09-11 NOTE — Assessment & Plan Note (Addendum)
 Initial pulmonary eval 04/16/24  - 09/11/2024   Walked on RA  x  2  lap(s) =  approx 300  ft  @ mod pace, stopped due to sob  with lowest 02 sats 92%    Cxr is as expected s/p RT and pt appears more limited by fatigue and geriatric decline than ILD or hypoxemia so f/u can be conservative here with just prn saba:  Re SABA :  I spent extra time with pt today reviewing appropriate use of albuterol  for prn use on exertion with the following points: 1) saba is for relief of sob that does not improve by walking a slower pace or resting but rather if the pt does not improve after trying this first. 2) If the pt is convinced, as many are, that saba helps recover from activity faster then it's easy to tell if this is the case by re-challenging : ie stop, take the inhaler, then p 5 minutes try the exact same activity (intensity of workload) that just caused the symptoms and see if they are substantially diminished or not after saba 3) if there is an activity that reproducibly causes the symptoms, try the saba 15 min before the activity on alternate days   If in fact the saba really does help, then fine to continue to use it prn but advised may need to look closer at the maintenance regimen (for now = 0) being used to achieve better control of airways disease with exertion.   Discussed in detail all the  indications, usual  risks and alternatives  relative to the benefits with patient who agrees to proceed with Rx as outlined.

## 2024-09-11 NOTE — Assessment & Plan Note (Signed)
 Rx as of 04/16/2024    = 2lpm hs and prn daytime  - 05/30/2024   Walked on RA  x  2 lap(s) =  approx 300  ft  @ mod pace, stopped due to groin pain with lowest 02 sats 89% s sob     >>>    09/11/2024 no desaturation  x 300 ft low pace > rec wear 02 2lpm hs and prn daytime with goal of sats > 90% walking

## 2024-09-11 NOTE — Patient Instructions (Signed)
 Please remember to go to the  x-ray department  @  [facility] for your tests - we will call you with the results when they are available     Ok to try albuterol  15 min before an activity (on alternating days)  that you know would usually make you short of breath and see if it makes any difference and if makes none then don't take albuterol  after activity unless you can't catch your breath as this means it's the resting that helps, not the albuterol .  Pulmonary follow up is as needed for new breathing problems or cough.

## 2024-09-13 ENCOUNTER — Ambulatory Visit: Payer: Self-pay | Admitting: Internal Medicine

## 2024-09-13 NOTE — Progress Notes (Signed)
 Spoke with pt to relay results, pt confirmed understanding. nfn

## 2024-09-27 ENCOUNTER — Telehealth: Payer: Self-pay | Admitting: *Deleted

## 2024-09-27 NOTE — Telephone Encounter (Signed)
 CALLED PATIENT TO INFORM OF CT FOR 10-09-24- ARRIVAL TIME- 6:15 PM @ [facility], NO RESTRICTIONS TO SCAN, PATIENT TO RECEIVE RESULTS FROM DR. KINARD ON 10-15-24 @ 11 AM, SPOKE WITH PATIENT AND HE IS AWARE OF THESE APPTS. AND THE INSTRUCTIONS

## 2024-10-09 ENCOUNTER — Ambulatory Visit (HOSPITAL_COMMUNITY)
Admission: RE | Admit: 2024-10-09 | Discharge: 2024-10-09 | Disposition: A | Discharge status: Home / Self Care | Source: Ambulatory Visit | Attending: Medical | Admitting: Medical

## 2024-10-09 ENCOUNTER — Ambulatory Visit (HOSPITAL_COMMUNITY)
Admission: RE | Admit: 2024-10-09 | Discharge: 2024-10-09 | Disposition: A | Discharge status: Home / Self Care | Source: Ambulatory Visit | Attending: Radiology | Admitting: Radiology

## 2024-10-09 DIAGNOSIS — K7460 Unspecified cirrhosis of liver: Secondary | ICD-10-CM | POA: Diagnosis not present

## 2024-10-09 DIAGNOSIS — K8020 Calculus of gallbladder without cholecystitis without obstruction: Secondary | ICD-10-CM | POA: Insufficient documentation

## 2024-10-09 DIAGNOSIS — C3432 Malignant neoplasm of lower lobe, left bronchus or lung: Secondary | ICD-10-CM | POA: Insufficient documentation

## 2024-10-09 DIAGNOSIS — I700 Atherosclerosis of aorta: Secondary | ICD-10-CM | POA: Diagnosis not present

## 2024-10-09 DIAGNOSIS — Z923 Personal history of irradiation: Secondary | ICD-10-CM | POA: Diagnosis not present

## 2024-10-09 DIAGNOSIS — I2510 Atherosclerotic heart disease of native coronary artery without angina pectoris: Secondary | ICD-10-CM | POA: Diagnosis not present

## 2024-10-09 DIAGNOSIS — I779 Disorder of arteries and arterioles, unspecified: Secondary | ICD-10-CM | POA: Insufficient documentation

## 2024-10-09 DIAGNOSIS — J439 Emphysema, unspecified: Secondary | ICD-10-CM | POA: Insufficient documentation

## 2024-10-10 ENCOUNTER — Emergency Department (HOSPITAL_COMMUNITY)

## 2024-10-10 ENCOUNTER — Encounter (HOSPITAL_COMMUNITY): Payer: Self-pay

## 2024-10-10 ENCOUNTER — Emergency Department (HOSPITAL_COMMUNITY)
Admission: EM | Admit: 2024-10-10 | Discharge: 2024-10-10 | Disposition: A | Discharge status: Home / Self Care | Attending: Emergency Medicine | Admitting: Emergency Medicine

## 2024-10-10 ENCOUNTER — Other Ambulatory Visit: Payer: Self-pay

## 2024-10-10 DIAGNOSIS — W19XXXA Unspecified fall, initial encounter: Secondary | ICD-10-CM | POA: Diagnosis not present

## 2024-10-10 DIAGNOSIS — J449 Chronic obstructive pulmonary disease, unspecified: Secondary | ICD-10-CM | POA: Insufficient documentation

## 2024-10-10 DIAGNOSIS — I509 Heart failure, unspecified: Secondary | ICD-10-CM | POA: Diagnosis not present

## 2024-10-10 DIAGNOSIS — Z7901 Long term (current) use of anticoagulants: Secondary | ICD-10-CM | POA: Insufficient documentation

## 2024-10-10 DIAGNOSIS — S0003XA Contusion of scalp, initial encounter: Secondary | ICD-10-CM | POA: Insufficient documentation

## 2024-10-10 DIAGNOSIS — S0990XA Unspecified injury of head, initial encounter: Secondary | ICD-10-CM | POA: Diagnosis present

## 2024-10-10 DIAGNOSIS — F0390 Unspecified dementia without behavioral disturbance: Secondary | ICD-10-CM | POA: Insufficient documentation

## 2024-10-10 NOTE — ED Provider Notes (Signed)
 [place] [facility]ERGENCY DEPARTMENT AT [facility] Provider Note   CSN: [id number] Arrival date & time: 10/10/24  1740     Patient presents with: Dustin [name] is a 89 y.o. male.    Fall  Patient presents after occipital scalp swelling.  Medical history includes dementia, HLD, OSA, CHF, COPD, BPH, depression, anxiety.  He is not on a blood thinner.  Patient reports a mechanical fall a week ago, during which he struck the back of his head.  2 days ago, he noticed a tender swelling, occipital scalp.  He is worried about a possible spider bite.  He states that he lives in the home with many spiders.  He denies any other concerns.      Prior to Admission medications   Medication Sig Start Date End Date Taking? Authorizing Provider  acetaminophen  (TYLENOL ) 500 MG tablet Take 2 tablets (1,000 mg total) by mouth in the morning, at noon, and at bedtime. Take medication as scheduled for the next 7 days and after that every 8 hours as needed for mild to moderate pain. 08/26/24   Ricky Fines, MD  albuterol  (PROVENTIL ) (2.5 MG/3ML) 0.083% nebulizer solution Take 3 mLs (2.5 mg total) by nebulization every 2 (two) hours as needed for wheezing or shortness of breath. 11/24/22   Pearlean Manus, MD  albuterol  (VENTOLIN  HFA) 108 (90 Base) MCG/ACT inhaler Inhale 2 puffs into the lungs every 6 (six) hours as needed for wheezing or shortness of breath. 11/24/22   Pearlean Manus, MD  alfuzosin  (UROXATRAL ) 10 MG 24 hr tablet Take 10 mg by mouth every morning. 06/19/24   [provider]  BREZTRI  AEROSPHERE 160-9-4.8 MCG/ACT AERO inhaler Inhale 2 puffs into the lungs in the morning and at bedtime. 09/11/24   [provider]  calcium -vitamin D  (OSCAL WITH D) 500-5 MG-MCG tablet Take 1 tablet by mouth 2 (two) times daily with a meal. 08/26/24   Ricky Fines, MD  clopidogrel  (PLAVIX ) 75 MG tablet Take 75 mg by mouth daily. 03/14/24   [provider]  docusate sodium   (COLACE) 100 MG capsule Take 100 mg by mouth daily as needed for mild constipation.    [provider]  DULoxetine  (CYMBALTA ) 30 MG capsule Take 30 mg by mouth daily. 05/10/23   [provider]  furosemide  (LASIX ) 20 MG tablet Take 1 tablet (20 mg total) by mouth 2 (two) times daily. For fluid 11/24/22   Pearlean Manus, MD  gabapentin  (NEURONTIN ) 100 MG capsule Take 100 mg by mouth 3 (three) times daily. 03/04/23   [provider]  isosorbide  mononitrate (IMDUR ) 120 MG 24 hr tablet Take 1 tablet (120 mg total) by mouth every morning. 11/24/22   Pearlean Manus, MD  levothyroxine  (SYNTHROID ) 75 MCG tablet Take 75 mcg by mouth daily. 02/23/22   [provider]  linaclotide  (LINZESS ) 145 MCG CAPS capsule Take 1 capsule (145 mcg total) by mouth daily before breakfast. 07/08/24   Cleotilde Rogue, MD  memantine  (NAMENDA ) 10 MG tablet Take 10 mg by mouth 2 (two) times daily.  03/05/16   [provider]  nitroGLYCERIN  (NITROSTAT ) 0.4 MG SL tablet Place 1 tablet (0.4 mg total) under the tongue every 5 (five) minutes as needed for chest pain. 07/08/24   Cleotilde Rogue, MD  oxycodone  (ROXICODONE ) 30 MG immediate release tablet Take 1 tablet (30 mg total) by mouth every 6 (six) hours as needed (Severe pain). May take one additional tablet as needed for pain 08/26/24  Ricky Fines, MD  OXYGEN  Inhale 2 L into the lungs at bedtime.    [provider]  pantoprazole  (PROTONIX ) 40 MG tablet Take 1 tablet (40 mg total) by mouth daily. 07/01/24 07/01/25  Maree, Pratik D, DO  potassium chloride  SA (KLOR-CON  M) 20 MEQ tablet Take 20 mEq by mouth daily. 06/19/24   [provider]  QUEtiapine  (SEROQUEL ) 25 MG tablet Take 25 mg by mouth at bedtime as needed. 03/14/24   [provider]  ranolazine  (RANEXA ) 500 MG 12 hr tablet Take 500 mg by mouth 2 (two) times daily. 07/06/23   [provider]  risperiDONE  (RISPERDAL ) 0.25 MG tablet Take 0.25 mg by mouth at  bedtime. 03/18/23   [provider]  rosuvastatin  (CRESTOR ) 40 MG tablet Take 1 tablet (40 mg total) by mouth daily at 6 PM. 02/24/24 09/11/24  Laurence Locus, DO    Allergies: Patient has no known allergies.    Review of Systems  Skin:        Occipital scalp swelling  All other systems reviewed and are negative.   Updated Vital Signs BP (!) 165/67   Pulse 70   Temp (!) 97.4 F (36.3 C) (Temporal)   Resp 18   Ht 5' 10 (1.778 m)   Wt 91.2 kg   SpO2 94%   BMI 28.85 kg/m   Physical Exam Vitals and nursing note reviewed.  Constitutional:      General: He is not in acute distress.    Appearance: Normal appearance. He is well-developed. He is not ill-appearing, toxic-appearing or diaphoretic.  HENT:     Head: Normocephalic.     Comments: Firm swelling on left occipital scalp.  Mildly tender.  No fluctuance, no erythema, no wound.    Right Ear: External ear normal.     Left Ear: External ear normal.     Nose: Nose normal.     Mouth/Throat:     Mouth: Mucous membranes are moist.  Eyes:     Extraocular Movements: Extraocular movements intact.     Conjunctiva/sclera: Conjunctivae normal.  Cardiovascular:     Rate and Rhythm: Normal rate and regular rhythm.  Pulmonary:     Effort: Pulmonary effort is normal. No respiratory distress.  Abdominal:     General: There is no distension.     Palpations: Abdomen is soft.  Musculoskeletal:        General: No swelling. Normal range of motion.     Cervical back: Normal range of motion and neck supple.  Skin:    General: Skin is warm and dry.     Coloration: Skin is not jaundiced or pale.  Neurological:     General: No focal deficit present.     Mental Status: He is alert and oriented to person, place, and time.     Cranial Nerves: No cranial nerve deficit.     Sensory: No sensory deficit.     Motor: No weakness.     Coordination: Coordination normal.  Psychiatric:        Mood and Affect: Mood normal.        Behavior:  Behavior normal.     (all labs ordered are listed, but only abnormal results are displayed) Labs Reviewed - No data to display  EKG: None  Radiology: CT Cervical Spine Wo Contrast Result Date: 10/10/2024 EXAM: CT CERVICAL SPINE WITHOUT CONTRAST 10/10/2024 08:33:25 PM TECHNIQUE: CT of the cervical spine was performed without the administration of intravenous contrast. Multiplanar reformatted images are provided  for review. Automated exposure control, iterative reconstruction, and/or weight based adjustment of the mA/kV was utilized to reduce the radiation dose to as low as reasonably achievable. COMPARISON: None available. CLINICAL HISTORY: Neck trauma (Age >= 65y). Pt arrived via POV from home c/o a knot on the back of his head from a fall at home where Pt reports he tripped in a hole in his yard and fell backwards. Pt reports apply ice to his head with minimal relief. Pt also reports concern for possibly being bit by a brown recluse spider. FINDINGS: BONES AND ALIGNMENT: No acute fracture or traumatic malalignment. DEGENERATIVE CHANGES: No significant degenerative changes. SOFT TISSUES: No prevertebral soft tissue swelling. IMPRESSION: 1. No acute abnormality of the cervical spine. Electronically signed by: Pinkie Pebbles MD 10/10/2024 08:40 PM EDT RP Workstation: [id number]   CT Head Wo Contrast Result Date: 10/10/2024 EXAM: CT HEAD WITHOUT CONTRAST 10/10/2024 08:33:25 PM TECHNIQUE: CT of the head was performed without the administration of intravenous contrast. Automated exposure control, iterative reconstruction, and/or weight based adjustment of the mA/kV was utilized to reduce the radiation dose to as low as reasonably achievable. COMPARISON: 08/24/2024 CLINICAL HISTORY: Head trauma, minor (Age >= 65y). Pt arrived via POV from home c/o a knot on the back of his head from a fall at home where Pt reports he tripped in a hole in his yard and fell backwards. Pt reports apply ice to his head with  minimal relief. Pt also reports concern for possibly being bit by a brown recluse spider. FINDINGS: BRAIN AND VENTRICLES: No acute hemorrhage. No evidence of acute infarct. Advanced cerebral volume loss. Periventricular white matter hypodensity consistent with chronic microvascular ischemic change. No hydrocephalus. No extra-axial collection. No mass effect or midline shift. ORBITS: Status post bilateral lens replacement. SINUSES: No acute abnormality. SOFT TISSUES AND SKULL: Left posterior parietal scalp contusion. No skull fracture. VASCULATURE: Moderate carotid siphon calcifications. IMPRESSION: 1. No acute intracranial abnormality. Electronically signed by: Pinkie Pebbles MD 10/10/2024 08:38 PM EDT RP Workstation: [id number]   CT CHEST WO CONTRAST Result Date: 10/10/2024 CLINICAL DATA:  Non-small cell lung cancer.  * Tracking Code: BO * EXAM: CT CHEST WITHOUT CONTRAST TECHNIQUE: Multidetector CT imaging of the chest was performed following the standard protocol without IV contrast. RADIATION DOSE REDUCTION: This exam was performed according to the departmental dose-optimization program which includes automated exposure control, adjustment of the mA and/or kV according to patient size and/or use of iterative reconstruction technique. COMPARISON:  08/24/2024. FINDINGS: Cardiovascular: Atherosclerotic calcification of the aorta, aortic valve and coronary arteries. Enlarged pulmonic trunk and heart. No pericardial effusion. Mediastinum/Nodes: No pathologically enlarged mediastinal or axillary lymph nodes. Hilar regions are difficult to definitively evaluate without IV contrast. Esophagus is grossly unremarkable. Lungs/Pleura: Centrilobular and paraseptal emphysema. Patchy consolidation and architectural distortion in the posterior left upper lobe and superior segment left lower lobe, indicative of radiation therapy. Interval clearing of previously seen consolidation in the right middle and right lower lobes.  Trace loculated left pleural effusion. Airway is unremarkable. Upper Abdomen: Liver margin is irregular. Gallstones. Low-attenuation lesion in the right kidney. No specific follow-up necessary. Visualized portions of the liver, gallbladder, adrenal glands, kidneys, spleen, pancreas, stomach and bowel are otherwise grossly unremarkable. No upper abdominal adenopathy. Musculoskeletal: Degenerative changes in the spine. Osteopenia. Old T12 compression fracture. Old rib fractures. IMPRESSION: 1. Post radiation scarring in the left lung. No evidence of recurrent or metastatic disease. 2. Interval clearing of previously seen bronchopneumonia in the right middle and right lower  lobes. 3. Cirrhosis. 4. Cholelithiasis. 5.  Emphysema (ICD10-J43.9). 6. Aortic atherosclerosis (ICD10-I70.0). Coronary artery calcification. 7. Enlarged pulmonic trunk, indicative of pulmonary arterial hypertension. Electronically Signed   By: Newell Eke M.D.   On: 10/10/2024 14:40   US  Carotid Duplex Bilateral Result Date: 10/09/2024 CLINICAL DATA:  89 year old male with history of carotid artery stenosis. EXAM: BILATERAL CAROTID DUPLEX ULTRASOUND TECHNIQUE: Elnor scale imaging, color Doppler and duplex ultrasound were performed of bilateral carotid and vertebral arteries in the neck. COMPARISON:  09/13/2022 FINDINGS: Criteria: Quantification of carotid stenosis is based on velocity parameters that correlate the residual internal carotid diameter with NASCET-based stenosis levels, using the diameter of the distal internal carotid lumen as the denominator for stenosis measurement. The following velocity measurements were obtained: RIGHT ICA: Peak systolic velocity 119 cm/sec, End diastolic velocity 30 cm/sec CCA: Peak systolic velocity 68 cm/sec SYSTOLIC ICA/CCA RATIO:  1.7, previously 1.1 ECA: Peak systolic velocity 64 cm/sec LEFT ICA: Peak systolic velocity 119 cm/sec, End diastolic velocity 18 cm/sec CCA: 143 cm/sec SYSTOLIC ICA/CCA  RATIO:  0.8, previously 0.9 ECA: 119 cm/sec RIGHT CAROTID ARTERY: Mild multifocal atherosclerotic plaque formation. No significant tortuosity. Normal low resistance waveforms. RIGHT VERTEBRAL ARTERY:  Antegrade flow. LEFT CAROTID ARTERY: Mild multifocal atherosclerotic plaque formation. No significant tortuosity. Normal low resistance waveforms. LEFT VERTEBRAL ARTERY:  Antegrade flow. Upper extremity non-invasive blood pressures: Not obtained. IMPRESSION: 1. Right carotid artery system: Less than 50% stenosis secondary to mild multifocal atherosclerotic plaque formation. 2. Left carotid artery system: Less than 50% stenosis secondary to mild multifocal atherosclerotic plaque formation. 3.  Vertebral artery system: Patent with antegrade flow bilaterally. Ester Sides, MD [facility] Electronically Signed   By: Ester Sides M.D.   On: 10/09/2024 15:31     Procedures   Medications Ordered in the ED - No data to display                                  Medical Decision Making Amount and/or Complexity of Data Reviewed Radiology: ordered.   Patient resenting for swelling on occipital scalp.  On arrival in the ED, vital signs notable for moderate hypertension.  Patient is well-appearing on exam.  He describes a fall that occurred a week ago, during which he struck the back of his head.  It was 2 days ago that he noticed a area of swelling and tenderness.  He is concerned about a brown recluse spider bite.  Given geographic location and the appearance of the swelling, this is unlikely.  There is no breakage of skin.  Area is nonerythematous.  On exam, did this more consistent with a hematoma.  Given this questionable traumatic history, CT scan was ordered.  Results of CT more consistent with contusion.  There were no other findings.  Patient was discharged in stable condition.     Final diagnoses:  Hematoma of scalp, initial encounter    ED  Discharge Orders     None          Melvenia Motto, MD 10/10/24 [phone]

## 2024-10-10 NOTE — Discharge Instructions (Signed)
 Your CT scan findings are more consistent with a contusion.  Swelling should resolve over the next several days.  Return to the emergency department for any new or worsening symptoms of concern.

## 2024-10-10 NOTE — ED Notes (Signed)
 Pts family member, Gabrial Domine notified of pts discharge. Per, Jon pt drove himself to the ED and can drive himself home. RN reviewed discharged instructions with pt and Jon via telephone with pts permission. Pt and Jon verbalized understanding. Pt escorted out to waiting area by staff.

## 2024-10-10 NOTE — ED Triage Notes (Signed)
 Pt arrived via POV from home c/o a knot on the back of his head from a fall at home where Pt reports he tripped in a hole in his yard and fell backwards. Pt reports apply ice to his head with minimal relief. Pt also reports concern for possibly being bit by a brown recluse spider.

## 2024-10-13 NOTE — Progress Notes (Signed)
 Radiation Oncology         [phone] ________________________________  Name: Dustin [name] MRN: [id number]  Date: 10/15/2024  DOB: 03/08/1934  Follow-Up Visit Note  CC: Bertell Satterfield, MD  Bertell Satterfield, MD  No diagnosis found.  Diagnosis: PET positive solitary lung nodule in the left lower lung, presumptive clinical stage I non-small cell lung cancer; s/p SBRT completed on 05/02/2023   Interval Since Last Radiation: 1 year, 5 months, and 14 days   Indication for treatment: Curative       Radiation treatment dates: 04/26/23 through 05/02/23 Site/dose: Left Lung - 54 Gy delivered in 3 Fx at 18 Gy/Fx Beams/energy:  6X-FFF Technique/Mode: SBRT/SRT-IMRT, Photon  Narrative:  The patient returns today for routine follow-up and to review recent imaging. He was last seen here for follow-up on 04/09/24.   Since his last visit, he has had quite a few hospital encounters. Several of these were in May and related to urinary retention / UTI. The remainder of his hospital encounters since his last visit are outlined as follows:  -- 07/05 - 07/06 admission: He presented to the ED with troubles breathing, intermittent chest pressure, and several month history of progressive dysphagia. He was also unable to keep down any liquids or solids in the 24 hours prior to presenting to the ED. ED workup was significant for leukocytosis, and a chest CT was performed which showed: a distended esophagus with impacted food bolus, an irregular airspace disease within the left posterior upper lobe and superior left lower lobe, and multifocal nodularity suspicious for aspiration. He was admitted and underwent an endoscopy and required intubation. His respiratory status was tenuous after extubation and he was subsequently admitted to the ICU for further management. He was started on IV Unasyn  for suspected aspiration pneumonia with improvement in his condition prior to discharge.                  -- 07/08/24 ED  encounter: He presented to the ED with a 3 week history of constipation from chronic oxycodone  use. An abdominal x-ray was performed which confirmed heavy stool burden. ED course included Linzess  with other stool softeners and laxatives and he was discharged with instructions to follow-up with OP GI for further management.  -- 08/29 -08/26/24 admission: He presented to the ED with c/o dyspnea, cough, SOB, and nausea with emesis. His wife was also present at that time and reported noticing worsening dementia in the patient and that he had a recent fall the week prior. ED work-up consisted of a chest CT that showed evidence of bibasilar pneumonia, most likely related to aspiration, and a new mildly displaced left fifth rib fracture; positive UA, and labs showing leukocytosis. He was accordingly started on abx and arranged with home health PT at discharge.  Other imaging performed while inpatient included a CT of the head which was negative, and a CT AP which showed evidence of a compression fracture to T12. He was evaluated by neurosurgery who did not advise surgical intervention related to the T12 compression fracture.     He has also continued to follow with pulmonology and was recently seen by Dr. Darlean on 09/11/24 for a routine visit. Given his decline related to dementia, baseline fatigue, and recent stable imaging findings, Dr. Darlean recommends proceeding with conservative follow up visits from now on. Dr. Darlean has advised him to still use his saba inhaler on an as needed basis.     His most recent chest CT  on 10/09/24 shows no evidence of recurrent or metastatic disease with stable post radiation scarring present in the left lung. The previously seen bronchopneumonia in the RML and RLL has also resolved.     In most recent history, he unfortunately suffered another fall earlier this month and was seen in the ED for this on 10/10/24. He did strike his head which resulted in some swelling to the left  occipital scalp. CT's of the head and cervical spine were obtained which were both negative for acute trauma/abnormalities, and he was discharged home in stable condition.    ***  Allergies:  has no known allergies.  Meds: Current Outpatient Medications  Medication Sig Dispense Refill   acetaminophen  (TYLENOL ) 500 MG tablet Take 2 tablets (1,000 mg total) by mouth in the morning, at noon, and at bedtime. Take medication as scheduled for the next 7 days and after that every 8 hours as needed for mild to moderate pain.     albuterol  (PROVENTIL ) (2.5 MG/3ML) 0.083% nebulizer solution Take 3 mLs (2.5 mg total) by nebulization every 2 (two) hours as needed for wheezing or shortness of breath. 75 mL 3   albuterol  (VENTOLIN  HFA) 108 (90 Base) MCG/ACT inhaler Inhale 2 puffs into the lungs every 6 (six) hours as needed for wheezing or shortness of breath. 18 g 3   alfuzosin  (UROXATRAL ) 10 MG 24 hr tablet Take 10 mg by mouth every morning.     BREZTRI  AEROSPHERE 160-9-4.8 MCG/ACT AERO inhaler Inhale 2 puffs into the lungs in the morning and at bedtime.     calcium -vitamin D  (OSCAL WITH D) 500-5 MG-MCG tablet Take 1 tablet by mouth 2 (two) times daily with a meal. 60 tablet 1   clopidogrel  (PLAVIX ) 75 MG tablet Take 75 mg by mouth daily.     docusate sodium  (COLACE) 100 MG capsule Take 100 mg by mouth daily as needed for mild constipation.     DULoxetine  (CYMBALTA ) 30 MG capsule Take 30 mg by mouth daily.     furosemide  (LASIX ) 20 MG tablet Take 1 tablet (20 mg total) by mouth 2 (two) times daily. For fluid 60 tablet 5   gabapentin  (NEURONTIN ) 100 MG capsule Take 100 mg by mouth 3 (three) times daily.     isosorbide  mononitrate (IMDUR ) 120 MG 24 hr tablet Take 1 tablet (120 mg total) by mouth every morning. 30 tablet 11   levothyroxine  (SYNTHROID ) 75 MCG tablet Take 75 mcg by mouth daily.     linaclotide  (LINZESS ) 145 MCG CAPS capsule Take 1 capsule (145 mcg total) by mouth daily before breakfast. 30  capsule 3   memantine  (NAMENDA ) 10 MG tablet Take 10 mg by mouth 2 (two) times daily.      nitroGLYCERIN  (NITROSTAT ) 0.4 MG SL tablet Place 1 tablet (0.4 mg total) under the tongue every 5 (five) minutes as needed for chest pain. 15 tablet 5   oxycodone  (ROXICODONE ) 30 MG immediate release tablet Take 1 tablet (30 mg total) by mouth every 6 (six) hours as needed (Severe pain). May take one additional tablet as needed for pain 30 tablet 0   OXYGEN  Inhale 2 L into the lungs at bedtime.     pantoprazole  (PROTONIX ) 40 MG tablet Take 1 tablet (40 mg total) by mouth daily. 30 tablet 1   potassium chloride  SA (KLOR-CON  M) 20 MEQ tablet Take 20 mEq by mouth daily.     QUEtiapine  (SEROQUEL ) 25 MG tablet Take 25 mg by mouth at bedtime as needed.  ranolazine  (RANEXA ) 500 MG 12 hr tablet Take 500 mg by mouth 2 (two) times daily.     risperiDONE  (RISPERDAL ) 0.25 MG tablet Take 0.25 mg by mouth at bedtime.     rosuvastatin  (CRESTOR ) 40 MG tablet Take 1 tablet (40 mg total) by mouth daily at 6 PM. 30 tablet 5   No current facility-administered medications for this encounter.    Physical Findings: The patient is in no acute distress. Patient is alert and oriented.  vitals were not taken for this visit. .  No significant changes. Lungs are clear to auscultation bilaterally. Heart has regular rate and rhythm. No palpable cervical, supraclavicular, or axillary adenopathy. Abdomen soft, non-tender, normal bowel sounds.   Lab Findings: Lab Results  Component Value Date   WBC 12.6 (H) 08/25/2024   HGB 11.3 (L) 08/25/2024   HCT 34.8 (L) 08/25/2024   MCV 95.1 08/25/2024   PLT 225 08/25/2024    Radiographic Findings: CT Cervical Spine Wo Contrast Result Date: 10/10/2024 EXAM: CT CERVICAL SPINE WITHOUT CONTRAST 10/10/2024 08:33:25 PM TECHNIQUE: CT of the cervical spine was performed without the administration of intravenous contrast. Multiplanar reformatted images are provided for review. Automated  exposure control, iterative reconstruction, and/or weight based adjustment of the mA/kV was utilized to reduce the radiation dose to as low as reasonably achievable. COMPARISON: None available. CLINICAL HISTORY: Neck trauma (Age >= 65y). Pt arrived via POV from home c/o a knot on the back of his head from a fall at home where Pt reports he tripped in a hole in his yard and fell backwards. Pt reports apply ice to his head with minimal relief. Pt also reports concern for possibly being bit by a brown recluse spider. FINDINGS: BONES AND ALIGNMENT: No acute fracture or traumatic malalignment. DEGENERATIVE CHANGES: No significant degenerative changes. SOFT TISSUES: No prevertebral soft tissue swelling. IMPRESSION: 1. No acute abnormality of the cervical spine. Electronically signed by: Pinkie Pebbles MD 10/10/2024 08:40 PM EDT RP Workstation: [id number]   CT Head Wo Contrast Result Date: 10/10/2024 EXAM: CT HEAD WITHOUT CONTRAST 10/10/2024 08:33:25 PM TECHNIQUE: CT of the head was performed without the administration of intravenous contrast. Automated exposure control, iterative reconstruction, and/or weight based adjustment of the mA/kV was utilized to reduce the radiation dose to as low as reasonably achievable. COMPARISON: 08/24/2024 CLINICAL HISTORY: Head trauma, minor (Age >= 65y). Pt arrived via POV from home c/o a knot on the back of his head from a fall at home where Pt reports he tripped in a hole in his yard and fell backwards. Pt reports apply ice to his head with minimal relief. Pt also reports concern for possibly being bit by a brown recluse spider. FINDINGS: BRAIN AND VENTRICLES: No acute hemorrhage. No evidence of acute infarct. Advanced cerebral volume loss. Periventricular white matter hypodensity consistent with chronic microvascular ischemic change. No hydrocephalus. No extra-axial collection. No mass effect or midline shift. ORBITS: Status post bilateral lens replacement. SINUSES: No acute  abnormality. SOFT TISSUES AND SKULL: Left posterior parietal scalp contusion. No skull fracture. VASCULATURE: Moderate carotid siphon calcifications. IMPRESSION: 1. No acute intracranial abnormality. Electronically signed by: Pinkie Pebbles MD 10/10/2024 08:38 PM EDT RP Workstation: [id number]   CT CHEST WO CONTRAST Result Date: 10/10/2024 CLINICAL DATA:  Non-small cell lung cancer.  * Tracking Code: BO * EXAM: CT CHEST WITHOUT CONTRAST TECHNIQUE: Multidetector CT imaging of the chest was performed following the standard protocol without IV contrast. RADIATION DOSE REDUCTION: This exam was performed according to the departmental dose-optimization  program which includes automated exposure control, adjustment of the mA and/or kV according to patient size and/or use of iterative reconstruction technique. COMPARISON:  08/24/2024. FINDINGS: Cardiovascular: Atherosclerotic calcification of the aorta, aortic valve and coronary arteries. Enlarged pulmonic trunk and heart. No pericardial effusion. Mediastinum/Nodes: No pathologically enlarged mediastinal or axillary lymph nodes. Hilar regions are difficult to definitively evaluate without IV contrast. Esophagus is grossly unremarkable. Lungs/Pleura: Centrilobular and paraseptal emphysema. Patchy consolidation and architectural distortion in the posterior left upper lobe and superior segment left lower lobe, indicative of radiation therapy. Interval clearing of previously seen consolidation in the right middle and right lower lobes. Trace loculated left pleural effusion. Airway is unremarkable. Upper Abdomen: Liver margin is irregular. Gallstones. Low-attenuation lesion in the right kidney. No specific follow-up necessary. Visualized portions of the liver, gallbladder, adrenal glands, kidneys, spleen, pancreas, stomach and bowel are otherwise grossly unremarkable. No upper abdominal adenopathy. Musculoskeletal: Degenerative changes in the spine. Osteopenia. Old T12  compression fracture. Old rib fractures. IMPRESSION: 1. Post radiation scarring in the left lung. No evidence of recurrent or metastatic disease. 2. Interval clearing of previously seen bronchopneumonia in the right middle and right lower lobes. 3. Cirrhosis. 4. Cholelithiasis. 5.  Emphysema (ICD10-J43.9). 6. Aortic atherosclerosis (ICD10-I70.0). Coronary artery calcification. 7. Enlarged pulmonic trunk, indicative of pulmonary arterial hypertension. Electronically Signed   By: Newell Eke M.D.   On: 10/10/2024 14:40   US  Carotid Duplex Bilateral Result Date: 10/09/2024 CLINICAL DATA:  89 year old male with history of carotid artery stenosis. EXAM: BILATERAL CAROTID DUPLEX ULTRASOUND TECHNIQUE: Elnor scale imaging, color Doppler and duplex ultrasound were performed of bilateral carotid and vertebral arteries in the neck. COMPARISON:  09/13/2022 FINDINGS: Criteria: Quantification of carotid stenosis is based on velocity parameters that correlate the residual internal carotid diameter with NASCET-based stenosis levels, using the diameter of the distal internal carotid lumen as the denominator for stenosis measurement. The following velocity measurements were obtained: RIGHT ICA: Peak systolic velocity 119 cm/sec, End diastolic velocity 30 cm/sec CCA: Peak systolic velocity 68 cm/sec SYSTOLIC ICA/CCA RATIO:  1.7, previously 1.1 ECA: Peak systolic velocity 64 cm/sec LEFT ICA: Peak systolic velocity 119 cm/sec, End diastolic velocity 18 cm/sec CCA: 143 cm/sec SYSTOLIC ICA/CCA RATIO:  0.8, previously 0.9 ECA: 119 cm/sec RIGHT CAROTID ARTERY: Mild multifocal atherosclerotic plaque formation. No significant tortuosity. Normal low resistance waveforms. RIGHT VERTEBRAL ARTERY:  Antegrade flow. LEFT CAROTID ARTERY: Mild multifocal atherosclerotic plaque formation. No significant tortuosity. Normal low resistance waveforms. LEFT VERTEBRAL ARTERY:  Antegrade flow. Upper extremity non-invasive blood pressures: Not  obtained. IMPRESSION: 1. Right carotid artery system: Less than 50% stenosis secondary to mild multifocal atherosclerotic plaque formation. 2. Left carotid artery system: Less than 50% stenosis secondary to mild multifocal atherosclerotic plaque formation. 3.  Vertebral artery system: Patent with antegrade flow bilaterally. Ester Sides, MD [facility] Electronically Signed   By: Ester Sides M.D.   On: 10/09/2024 15:31    Impression: PET positive solitary lung nodule in the left lower lung, presumptive clinical stage I non-small cell lung cancer; s/p SBRT completed on 05/02/2023   The patient is recovering from the effects of radiation.  ***  Plan:  ***   *** minutes of total time was spent for this patient encounter, including preparation, face-to-face counseling with the patient and coordination of care, physical exam, and documentation of the encounter. ____________________________________  Lynwood CHARM Nasuti, PhD, MD  This document serves as a record of services personally performed by Lynwood Nasuti, MD.  It was created on his behalf by Dorthy Fuse, a trained medical scribe. The creation of this record is based on the scribe's personal observations and the provider's statements to them. This document has been checked and approved by the attending provider.

## 2024-10-15 ENCOUNTER — Ambulatory Visit
Admission: RE | Admit: 2024-10-15 | Discharge: 2024-10-15 | Disposition: A | Discharge status: Home / Self Care | Payer: Self-pay | Source: Ambulatory Visit | Attending: Radiation Oncology | Admitting: Radiation Oncology

## 2024-10-15 ENCOUNTER — Encounter: Payer: Self-pay | Admitting: Radiation Oncology

## 2024-10-15 VITALS — BP 189/86 | HR 62 | Temp 97.8°F | Resp 20 | Ht 70.0 in | Wt 203.8 lb

## 2024-10-15 DIAGNOSIS — Z923 Personal history of irradiation: Secondary | ICD-10-CM | POA: Diagnosis not present

## 2024-10-15 DIAGNOSIS — Z85118 Personal history of other malignant neoplasm of bronchus and lung: Secondary | ICD-10-CM | POA: Insufficient documentation

## 2024-10-15 DIAGNOSIS — I2510 Atherosclerotic heart disease of native coronary artery without angina pectoris: Secondary | ICD-10-CM | POA: Diagnosis not present

## 2024-10-15 DIAGNOSIS — R5383 Other fatigue: Secondary | ICD-10-CM | POA: Diagnosis not present

## 2024-10-15 DIAGNOSIS — J180 Bronchopneumonia, unspecified organism: Secondary | ICD-10-CM | POA: Insufficient documentation

## 2024-10-15 DIAGNOSIS — I6523 Occlusion and stenosis of bilateral carotid arteries: Secondary | ICD-10-CM | POA: Diagnosis not present

## 2024-10-15 DIAGNOSIS — Z79890 Hormone replacement therapy (postmenopausal): Secondary | ICD-10-CM | POA: Diagnosis not present

## 2024-10-15 DIAGNOSIS — Z7902 Long term (current) use of antithrombotics/antiplatelets: Secondary | ICD-10-CM | POA: Diagnosis not present

## 2024-10-15 DIAGNOSIS — J90 Pleural effusion, not elsewhere classified: Secondary | ICD-10-CM | POA: Insufficient documentation

## 2024-10-15 DIAGNOSIS — I700 Atherosclerosis of aorta: Secondary | ICD-10-CM | POA: Diagnosis not present

## 2024-10-15 DIAGNOSIS — F0390 Unspecified dementia without behavioral disturbance: Secondary | ICD-10-CM | POA: Diagnosis not present

## 2024-10-15 DIAGNOSIS — M8580 Other specified disorders of bone density and structure, unspecified site: Secondary | ICD-10-CM | POA: Diagnosis not present

## 2024-10-15 DIAGNOSIS — Z79899 Other long term (current) drug therapy: Secondary | ICD-10-CM | POA: Insufficient documentation

## 2024-10-15 DIAGNOSIS — C3432 Malignant neoplasm of lower lobe, left bronchus or lung: Secondary | ICD-10-CM

## 2024-10-15 NOTE — Progress Notes (Signed)
 Dustin [name] is here today for follow up post radiation to the lung.  Lung Side: Left, patient completed treatment on 05/02/23  Does the patient complain of any of the following: Pain: Reports left side chest pain at times. Patient reports pain is relieved with nitroglycerin .  Shortness of breath w/wo exertion: Yes Cough: Yes, productive Hemoptysis: No Pain with swallowing: No Swallowing/choking concerns: Yes, choking on food at times.  Appetite: Good  Energy Level: Poor  Post radiation skin Changes: No    Additional comments if applicable:   BP (!) 189/86 (BP Location: Right Arm, Patient Position: Sitting, Cuff Size: Large)   Pulse 62   Temp 97.8 F (36.6 C)   Resp 20   Ht 5' 10 (1.778 m)   Wt 203 lb 12.8 oz (92.4 kg)   SpO2 95%   BMI 29.24 kg/m

## 2024-11-21 ENCOUNTER — Ambulatory Visit

## 2024-11-21 VITALS — BP 133/67 | HR 67 | Resp 16 | Ht 70.0 in | Wt 199.1 lb

## 2024-11-21 DIAGNOSIS — E039 Hypothyroidism, unspecified: Secondary | ICD-10-CM

## 2024-11-21 DIAGNOSIS — I10 Essential (primary) hypertension: Secondary | ICD-10-CM | POA: Diagnosis not present

## 2024-11-21 DIAGNOSIS — F32A Depression, unspecified: Secondary | ICD-10-CM

## 2024-11-21 DIAGNOSIS — K5909 Other constipation: Secondary | ICD-10-CM

## 2024-11-21 DIAGNOSIS — I2510 Atherosclerotic heart disease of native coronary artery without angina pectoris: Secondary | ICD-10-CM | POA: Diagnosis not present

## 2024-11-21 DIAGNOSIS — N401 Enlarged prostate with lower urinary tract symptoms: Secondary | ICD-10-CM

## 2024-11-21 DIAGNOSIS — N138 Other obstructive and reflux uropathy: Secondary | ICD-10-CM

## 2024-11-21 DIAGNOSIS — I5032 Chronic diastolic (congestive) heart failure: Secondary | ICD-10-CM

## 2024-11-21 DIAGNOSIS — G894 Chronic pain syndrome: Secondary | ICD-10-CM

## 2024-11-21 DIAGNOSIS — F0390 Unspecified dementia without behavioral disturbance: Secondary | ICD-10-CM

## 2024-11-21 DIAGNOSIS — E782 Mixed hyperlipidemia: Secondary | ICD-10-CM

## 2024-11-21 MED ORDER — RISPERIDONE 0.25 MG PO TABS: 0.2500 mg | ORAL_TABLET | Freq: Every day | ORAL | 5 refills | Status: AC

## 2024-11-21 MED ORDER — QUETIAPINE FUMARATE 25 MG PO TABS: 25.0000 mg | ORAL_TABLET | Freq: Every evening | ORAL | 5 refills | Status: AC | PRN

## 2024-11-21 MED ORDER — RANOLAZINE ER 500 MG PO TB12
500.0000 mg | ORAL_TABLET | Freq: Two times a day (BID) | ORAL | 5 refills | Status: AC
Start: 2024-11-21 — End: ?

## 2024-11-21 MED ORDER — ISOSORBIDE MONONITRATE ER 120 MG PO TB24
120.0000 mg | ORAL_TABLET | Freq: Every morning | ORAL | 5 refills | Status: AC
Start: 2024-11-21 — End: ?

## 2024-11-21 MED ORDER — FUROSEMIDE 20 MG PO TABS: 20.0000 mg | ORAL_TABLET | Freq: Two times a day (BID) | ORAL | 5 refills | Status: AC

## 2024-11-21 MED ORDER — DOCUSATE SODIUM 100 MG PO CAPS: 100.0000 mg | ORAL_CAPSULE | Freq: Every day | ORAL | 5 refills | Status: AC

## 2024-11-21 MED ORDER — DULOXETINE HCL 30 MG PO CPEP
30.0000 mg | ORAL_CAPSULE | Freq: Every day | ORAL | 5 refills | Status: AC
Start: 2024-11-21 — End: ?

## 2024-11-21 MED ORDER — MEMANTINE HCL 10 MG PO TABS: 10.0000 mg | ORAL_TABLET | Freq: Two times a day (BID) | ORAL | 5 refills | Status: AC

## 2024-11-21 MED ORDER — POTASSIUM CHLORIDE CRYS ER 20 MEQ PO TBCR: 20.0000 meq | EXTENDED_RELEASE_TABLET | Freq: Every day | ORAL | 5 refills | Status: AC

## 2024-11-21 MED ORDER — CLOPIDOGREL BISULFATE 75 MG PO TABS
75.0000 mg | ORAL_TABLET | Freq: Every day | ORAL | 5 refills | Status: AC
Start: 2024-11-21 — End: ?

## 2024-11-21 MED ORDER — GABAPENTIN 100 MG PO CAPS
100.0000 mg | ORAL_CAPSULE | Freq: Three times a day (TID) | ORAL | 5 refills | Status: AC
Start: 2024-11-21 — End: ?

## 2024-11-21 MED ORDER — ROSUVASTATIN CALCIUM 40 MG PO TABS: 40.0000 mg | ORAL_TABLET | Freq: Every day | ORAL | 5 refills | Status: AC

## 2024-11-21 MED ORDER — OXYCODONE HCL 30 MG PO TABS: 30.0000 mg | ORAL_TABLET | Freq: Four times a day (QID) | ORAL | 0 refills | Status: DC | PRN

## 2024-11-21 MED ORDER — ALFUZOSIN HCL ER 10 MG PO TB24
10.0000 mg | ORAL_TABLET | Freq: Every morning | ORAL | 5 refills | Status: AC
Start: 2024-11-21 — End: ?

## 2024-11-21 MED ORDER — LEVOTHYROXINE SODIUM 75 MCG PO TABS: 75.0000 ug | ORAL_TABLET | Freq: Every day | ORAL | 5 refills | Status: AC

## 2024-11-21 MED ORDER — NITROGLYCERIN 0.4 MG SL SUBL: 0.4000 mg | SUBLINGUAL_TABLET | SUBLINGUAL | 5 refills | Status: DC | PRN

## 2024-11-21 NOTE — Progress Notes (Signed)
 New Patient Office Visit  Subjective    Patient ID: Dustin [name], male    DOB: 1934-05-15  Age: 89 y.o. MRN: [id number]  CC:  Chief Complaint  Patient presents with   Establish Care    New pt     HPI Dustin [name] presents to establish care Discussed the use of AI scribe software for clinical note transcription with the patient, who gave verbal consent to proceed.  History of Present Illness    Dustin [name] is an 89 year old male who presents for medication management and follow-up.  Visual impairment - History of cataract surgery on one eye - Advised against further surgery due to risk of blindness after a 'module' behind the eye burst during prior procedure - No recent ophthalmology evaluation - Uses reading glasses  Chronic leg pain post-coronary artery bypass grafting - Status post six-vessel coronary artery bypass grafting with vein harvest from legs - Persistent leg pain since surgery, described as 'frogs jumping' on his legs - Pain managed previously with oxycodone  and gabapentin , with oxycodone  providing the only relief - Last received oxycodone  in September  Coronary artery disease and angina - History of six-vessel coronary artery bypass grafting - History of coronary stent placements - Uses nitroglycerin  as needed for heart pain  Hypertension - History of blood pressure management  Medication management - Takes Plavix , Cymbalta , paroxetine, gabapentin , pantoprazole , potassium, risperidone , rosuvastatin , and nitroglycerin  - Receives monthly medication supply from [place], except for oxycodone  and nitroglycerin , which are obtained from [facility]      Outpatient Encounter Medications as of 11/21/2024  Medication Sig   acetaminophen  (TYLENOL ) 500 MG tablet Take 2 tablets (1,000 mg total) by mouth in the morning, at noon, and at bedtime. Take medication as scheduled for the next 7 days and after that every 8 hours as needed for mild to  moderate pain.   albuterol  (PROVENTIL ) (2.5 MG/3ML) 0.083% nebulizer solution Take 3 mLs (2.5 mg total) by nebulization every 2 (two) hours as needed for wheezing or shortness of breath.   albuterol  (VENTOLIN  HFA) 108 (90 Base) MCG/ACT inhaler Inhale 2 puffs into the lungs every 6 (six) hours as needed for wheezing or shortness of breath.   BREZTRI  AEROSPHERE 160-9-4.8 MCG/ACT AERO inhaler Inhale 2 puffs into the lungs in the morning and at bedtime.   calcium -vitamin D  (OSCAL WITH D) 500-5 MG-MCG tablet Take 1 tablet by mouth 2 (two) times daily with a meal.   linaclotide  (LINZESS ) 145 MCG CAPS capsule Take 1 capsule (145 mcg total) by mouth daily before breakfast.   OXYGEN  Inhale 2 L into the lungs at bedtime.   pantoprazole  (PROTONIX ) 40 MG tablet Take 1 tablet (40 mg total) by mouth daily.   [DISCONTINUED] alfuzosin  (UROXATRAL ) 10 MG 24 hr tablet Take 10 mg by mouth every morning.   [DISCONTINUED] clopidogrel  (PLAVIX ) 75 MG tablet Take 75 mg by mouth daily.   [DISCONTINUED] docusate sodium  (COLACE) 100 MG capsule Take 100 mg by mouth daily as needed for mild constipation.   [DISCONTINUED] DULoxetine  (CYMBALTA ) 30 MG capsule Take 30 mg by mouth daily.   [DISCONTINUED] furosemide  (LASIX ) 20 MG tablet Take 1 tablet (20 mg total) by mouth 2 (two) times daily. For fluid   [DISCONTINUED] gabapentin  (NEURONTIN ) 100 MG capsule Take 100 mg by mouth 3 (three) times daily.   [DISCONTINUED] isosorbide  mononitrate (IMDUR ) 120 MG 24 hr tablet Take 1 tablet (120 mg total) by mouth every morning.   [DISCONTINUED] levothyroxine  (SYNTHROID ) 75  MCG tablet Take 75 mcg by mouth daily.   [DISCONTINUED] memantine  (NAMENDA ) 10 MG tablet Take 10 mg by mouth 2 (two) times daily.    [DISCONTINUED] nitroGLYCERIN  (NITROSTAT ) 0.4 MG SL tablet Place 1 tablet (0.4 mg total) under the tongue every 5 (five) minutes as needed for chest pain.   [DISCONTINUED] oxycodone  (ROXICODONE ) 30 MG immediate release tablet Take 1 tablet (30  mg total) by mouth every 6 (six) hours as needed (Severe pain). May take one additional tablet as needed for pain   [DISCONTINUED] potassium chloride  SA (KLOR-CON  M) 20 MEQ tablet Take 20 mEq by mouth daily.   [DISCONTINUED] QUEtiapine  (SEROQUEL ) 25 MG tablet Take 25 mg by mouth at bedtime as needed.   [DISCONTINUED] ranolazine  (RANEXA ) 500 MG 12 hr tablet Take 500 mg by mouth 2 (two) times daily.   [DISCONTINUED] risperiDONE  (RISPERDAL ) 0.25 MG tablet Take 0.25 mg by mouth at bedtime.   [DISCONTINUED] rosuvastatin  (CRESTOR ) 40 MG tablet Take 1 tablet (40 mg total) by mouth daily at 6 PM.   alfuzosin  (UROXATRAL ) 10 MG 24 hr tablet Take 1 tablet (10 mg total) by mouth every morning.   clopidogrel  (PLAVIX ) 75 MG tablet Take 1 tablet (75 mg total) by mouth daily.   docusate sodium  (COLACE) 100 MG capsule Take 1 capsule (100 mg total) by mouth daily.   DULoxetine  (CYMBALTA ) 30 MG capsule Take 1 capsule (30 mg total) by mouth daily.   furosemide  (LASIX ) 20 MG tablet Take 1 tablet (20 mg total) by mouth 2 (two) times daily. For fluid   gabapentin  (NEURONTIN ) 100 MG capsule Take 1 capsule (100 mg total) by mouth 3 (three) times daily.   isosorbide  mononitrate (IMDUR ) 120 MG 24 hr tablet Take 1 tablet (120 mg total) by mouth every morning.   levothyroxine  (SYNTHROID ) 75 MCG tablet Take 1 tablet (75 mcg total) by mouth daily.   memantine  (NAMENDA ) 10 MG tablet Take 1 tablet (10 mg total) by mouth 2 (two) times daily.   nitroGLYCERIN  (NITROSTAT ) 0.4 MG SL tablet Place 1 tablet (0.4 mg total) under the tongue every 5 (five) minutes as needed for chest pain.   oxycodone  (ROXICODONE ) 30 MG immediate release tablet Take 1 tablet (30 mg total) by mouth every 6 (six) hours as needed (Severe pain). May take one additional tablet as needed for pain   potassium chloride  SA (KLOR-CON  M) 20 MEQ tablet Take 1 tablet (20 mEq total) by mouth daily.   QUEtiapine  (SEROQUEL ) 25 MG tablet Take 1 tablet (25 mg total) by mouth  at bedtime as needed.   ranolazine  (RANEXA ) 500 MG 12 hr tablet Take 1 tablet (500 mg total) by mouth 2 (two) times daily.   risperiDONE  (RISPERDAL ) 0.25 MG tablet Take 1 tablet (0.25 mg total) by mouth at bedtime.   rosuvastatin  (CRESTOR ) 40 MG tablet Take 1 tablet (40 mg total) by mouth daily at 6 PM.   No facility-administered encounter medications on file as of 11/21/2024.    Past Medical History:  Diagnosis Date   Alzheimer disease (HCC)    Anxiety    Arthritis    Atrophic gastritis    a. By EGD 02/2013.   Carotid artery disease    a. mild-mod plaque, <50% stenosis bilat by duplex 2018.   Coronary atherosclerosis of native coronary artery    a. Multivessel s/p CABG 1996. b. DESx2 to SVG-PDA in 2012 c. DES to distal LM in 11/2019 d. cath in 02/2021 showing patent LM stent, patent LIMA-LAD, patent SVG-OM1 with  chronically occluded jump limb to OM2 and chronic occlusion of SVG-D2 and SVG-PDA and no targets for intervention   DDD (degenerative disc disease)    Chronic back pain   Dementia (HCC)    Enlarged prostate    Essential hypertension    Hematuria    History of radiation therapy    Left Lung- 04/26/23-Dr. Lynwood Nasuti   Hypothyroidism    LBBB (left bundle branch block)    MI (myocardial infarction) (HCC)    Mixed hyperlipidemia    OA (osteoarthritis)    OSA (obstructive sleep apnea)    Pneumonia due to COVID-19 virus    February 2021   Sinus bradycardia    a. Aricept  and BB discontinued due to this.    Past Surgical History:  Procedure Laterality Date   CATARACT EXTRACTION W/PHACO Left 03/22/2022   Procedure: CATARACT EXTRACTION PHACO AND INTRAOCULAR LENS PLACEMENT (IOC);  Surgeon: Harrie Lynwood, MD;  Location: [facility];  Service: Ophthalmology;  Laterality: Left;  CDE: 25.22   COLONOSCOPY  08/03/2004   Jenkins-numerous large diverticula in the descending, transverse, descending, and sigmoid colon. Otherwise normal exam.   COLONOSCOPY  07/12/2012   RMR: External  hemorrhoidal tag; multiple rectal and colonic polyps removed and/or treated as described above. Pancolonic diverticulosis. Bx-tubular adenomas, rectal hyperplastic polyp. next colonoscopy in 06/2015.   COLONOSCOPY N/A 06/22/2016   Procedure: COLONOSCOPY;  Surgeon: Oneil Budge, MD;  Location: [facility];  Service: Gastroenterology;  Laterality: N/A;  730   CORONARY ANGIOPLASTY WITH STENT PLACEMENT     CORONARY ARTERY BYPASS GRAFT  1996   LIMA to LAD, SVG to D2, SVG to PDA, SVG to OM1 and OM2   CORONARY STENT INTERVENTION N/A 12/10/2019   Procedure: CORONARY STENT INTERVENTION;  Surgeon: Verlin Lonni BIRCH, MD;  Location: [facility];  Service: Cardiovascular;  Laterality: N/A;   CORONARY ULTRASOUND/IVUS N/A 12/10/2019   Procedure: Intravascular Ultrasound/IVUS;  Surgeon: Verlin Lonni BIRCH, MD;  Location: [facility];  Service: Cardiovascular;  Laterality: N/A;   ESOPHAGOGASTRODUODENOSCOPY N/A 03/16/2013   Procedure: ESOPHAGOGASTRODUODENOSCOPY (EGD);  Surgeon: Lamar CHRISTELLA Hollingshead, MD;  Location: [facility];  Service: Endoscopy;  Laterality: N/A;  12:00-moved to 1030 Leigh Ann notified pt   ESOPHAGOGASTRODUODENOSCOPY N/A 03/06/2013   Procedure: ESOPHAGOGASTRODUODENOSCOPY (EGD);  Surgeon: Lamar CHRISTELLA Hollingshead, MD;  Location: [facility];  Service: Endoscopy;  Laterality: N/A;   ESOPHAGOGASTRODUODENOSCOPY N/A 06/30/2024   Procedure: EGD (ESOPHAGOGASTRODUODENOSCOPY);  Surgeon: Hollingshead Lamar CHRISTELLA, MD;  Location: [facility];  Service: Endoscopy;  Laterality: N/A;   HERNIA REPAIR     LEFT HEART CATH AND CORS/GRAFTS ANGIOGRAPHY N/A 12/10/2019   Procedure: LEFT HEART CATH AND CORS/GRAFTS ANGIOGRAPHY;  Surgeon: Verlin Lonni BIRCH, MD;  [facility];  Service: Cardiovascular;  Laterality: N/A;   LEFT HEART CATH AND CORS/GRAFTS ANGIOGRAPHY N/A 03/02/2021   Procedure: LEFT HEART CATH AND CORS/GRAFTS ANGIOGRAPHY;  Surgeon: Mady Lonni, MD;  [facility]: [facility];   Service: Cardiovascular;  Laterality: N/A;    Family History  Problem Relation Age of Onset   Heart disease Other    Heart attack Mother    Colon cancer Neg Hx     Social History   Socioeconomic History   Marital status: Married    Spouse name: Not on file   Number of children: 3   Years of education: Not on file   Highest education level: Not on file  Occupational History   Occupation: retired  Employer: RETIRED  Tobacco Use   Smoking status: Former    Current packs/day: 0.00    Average packs/day: 2.0 packs/day for 40.0 years (80.0 ttl pk-yrs)    Types: Cigarettes    Start date: 12/27/1954    Quit date: 12/27/1994    Years since quitting: 29.9    Passive exposure: Past   Smokeless tobacco: Never  Vaping Use   Vaping status: Never Used  Substance and Sexual Activity   Alcohol use: No    Alcohol/week: 0.0 standard drinks of alcohol   Drug use: No   Sexual activity: Not Currently  Other Topics Concern   Not on file  Social History Narrative   Lives w/ ailing wife.   Son brings meals 3x per week   Social Drivers of Health   Financial Resource Strain: Low Risk  (11/21/2024)   Overall Financial Resource Strain (CARDIA)    Difficulty of Paying Living Expenses: Not hard at all  Food Insecurity: No Food Insecurity (11/21/2024)   Hunger Vital Sign    Worried About Running Out of Food in the Last Year: Never true    Ran Out of Food in the Last Year: Never true  Transportation Needs: No Transportation Needs (11/21/2024)   PRAPARE - Administrator, Civil Service (Medical): No    Lack of Transportation (Non-Medical): No  Physical Activity: Inactive (11/21/2024)   Exercise Vital Sign    Days of Exercise per Week: 0 days    Minutes of Exercise per Session: 0 min  Stress: No Stress Concern Present (11/21/2024)   [employer] of Occupational Health - Occupational Stress Questionnaire    Feeling of Stress: Not at all  Social Connections: Moderately Isolated  (11/21/2024)   Social Connection and Isolation Panel    Frequency of Communication with Friends and Family: More than three times a week    Frequency of Social Gatherings with Friends and Family: Once a week    Attends Religious Services: Never    Database Administrator or Organizations: No    Attends Banker Meetings: Never    Marital Status: Married  Catering Manager Violence: Not At Risk (11/21/2024)   Humiliation, Afraid, Rape, and Kick questionnaire    Fear of Current or Ex-Partner: No    Emotionally Abused: No    Physically Abused: No    Sexually Abused: No    ROS      Objective    BP 133/67   Pulse 67   Resp 16   Ht 5' 10 (1.778 m)   Wt 199 lb 1.9 oz (90.3 kg)   SpO2 (!) 87%   BMI 28.57 kg/m   Physical Exam Vitals and nursing note reviewed.  Constitutional:      Appearance: Normal appearance.  HENT:     Head: Normocephalic.  Eyes:     Extraocular Movements: Extraocular movements intact.     Pupils: Pupils are equal, round, and reactive to light.  Cardiovascular:     Rate and Rhythm: Normal rate and regular rhythm.  Pulmonary:     Effort: Pulmonary effort is normal.     Breath sounds: Normal breath sounds.  Musculoskeletal:     Cervical back: Normal range of motion and neck supple.  Neurological:     Mental Status: He is alert and oriented to person, place, and time.  Psychiatric:        Attention and Perception: Attention normal.        Mood and Affect: Mood  is anxious (patient misplaced his keys this morning and then showed up to his appt an hour early.).        Behavior: Behavior normal.        Thought Content: Thought content normal.        Cognition and Memory: Cognition and memory normal.         Assessment & Plan:   Problem List Items Addressed This Visit       Cardiovascular and Mediastinum   HYPERTENSION, BENIGN - Primary   Stable with current medications.  No changes in regimen made.  Refills sent to mail order  pharmacy.       Relevant Medications   furosemide  (LASIX ) 20 MG tablet   isosorbide  mononitrate (IMDUR ) 120 MG 24 hr tablet   potassium chloride  SA (KLOR-CON  M) 20 MEQ tablet   ranolazine  (RANEXA ) 500 MG 12 hr tablet   rosuvastatin  (CRESTOR ) 40 MG tablet   nitroGLYCERIN  (NITROSTAT ) 0.4 MG SL tablet   Coronary artery disease - not on betablocker due to baseline bradycardia. Documented by cardiology   Stable with current medications.  No changes in regimen made.  Managed with nitroglycerin  for angina. - Refilled nitroglycerin  prescription.      Relevant Medications   clopidogrel  (PLAVIX ) 75 MG tablet   furosemide  (LASIX ) 20 MG tablet   isosorbide  mononitrate (IMDUR ) 120 MG 24 hr tablet   ranolazine  (RANEXA ) 500 MG 12 hr tablet   rosuvastatin  (CRESTOR ) 40 MG tablet   nitroGLYCERIN  (NITROSTAT ) 0.4 MG SL tablet   Chronic diastolic CHF (congestive heart failure) (HCC)   Stable with current medications.  No changes in regimen made.  Refills sent to mail order pharmacy.       Relevant Medications   furosemide  (LASIX ) 20 MG tablet   isosorbide  mononitrate (IMDUR ) 120 MG 24 hr tablet   ranolazine  (RANEXA ) 500 MG 12 hr tablet   rosuvastatin  (CRESTOR ) 40 MG tablet   nitroGLYCERIN  (NITROSTAT ) 0.4 MG SL tablet     Digestive   Chronic constipation   Managed with Linzess  for opioid-induced constipation.       Relevant Medications   docusate sodium  (COLACE) 100 MG capsule     Endocrine   Hypothyroidism   - Continue Synthroid       Relevant Medications   levothyroxine  (SYNTHROID ) 75 MCG tablet     Nervous and Auditory   Dementia (HCC)   Currently managed with Namenda . Refills provided.        Relevant Medications   DULoxetine  (CYMBALTA ) 30 MG capsule   gabapentin  (NEURONTIN ) 100 MG capsule   memantine  (NAMENDA ) 10 MG tablet   QUEtiapine  (SEROQUEL ) 25 MG tablet   risperiDONE  (RISPERDAL ) 0.25 MG tablet     Genitourinary   Benign prostatic hyperplasia with urinary obstruction    Stable with current medication regimen.  No changes made today.       Relevant Medications   alfuzosin  (UROXATRAL ) 10 MG 24 hr tablet     Other   Hyperlipidemia   Managed with rosuvastatin .       Relevant Medications   furosemide  (LASIX ) 20 MG tablet   isosorbide  mononitrate (IMDUR ) 120 MG 24 hr tablet   ranolazine  (RANEXA ) 500 MG 12 hr tablet   rosuvastatin  (CRESTOR ) 40 MG tablet   nitroGLYCERIN  (NITROSTAT ) 0.4 MG SL tablet   Depression   - Continue Lexapro  and Cymbalta       Relevant Medications   DULoxetine  (CYMBALTA ) 30 MG capsule   Chronic pain   Chronic leg pain post-bypass surgery,  severe and persistent, managed with oxycodone  and gabapentin . - Prescribed oxycodone  for pain management. PDMP reviewed.       Relevant Medications   DULoxetine  (CYMBALTA ) 30 MG capsule   gabapentin  (NEURONTIN ) 100 MG capsule   oxycodone  (ROXICODONE ) 30 MG immediate release tablet    Return in about 4 months (around 03/21/2025) for chronic follow-up with PCP.   Leita Longs, FNP

## 2024-11-25 NOTE — Assessment & Plan Note (Addendum)
 Stable with current medications.  No changes in regimen made.  Managed with nitroglycerin  for angina. - Refilled nitroglycerin  prescription.

## 2024-11-25 NOTE — Assessment & Plan Note (Signed)
 Stable with current medications.  No changes in regimen made.  Refills sent to mail order pharmacy.

## 2024-11-25 NOTE — Assessment & Plan Note (Signed)
 Continue Synthroid 

## 2024-11-25 NOTE — Assessment & Plan Note (Signed)
 Chronic leg pain post-bypass surgery, severe and persistent, managed with oxycodone  and gabapentin . - Prescribed oxycodone  for pain management. PDMP reviewed.

## 2024-11-25 NOTE — Assessment & Plan Note (Addendum)
 Managed with Linzess  for opioid-induced constipation.

## 2024-11-25 NOTE — Assessment & Plan Note (Signed)
 Stable with current medication regimen.  No changes made today.

## 2024-11-25 NOTE — Assessment & Plan Note (Signed)
-   Continue Lexapro and Cymbalta

## 2024-11-25 NOTE — Assessment & Plan Note (Signed)
Managed with rosuvastatin  ?

## 2024-11-25 NOTE — Assessment & Plan Note (Signed)
 Currently managed with Namenda . Refills provided.

## 2025-01-04 ENCOUNTER — Telehealth: Payer: Self-pay

## 2025-01-04 NOTE — Telephone Encounter (Signed)
 Prescription Request  01/04/2025  LOV: 11/21/2024  What is the name of the medication or equipment? Oxycodone  (ROXICODONE ) 30 MG immediate release tablet [[id number]]    nitroGLYCERIN  (NITROSTAT ) 0.4 MG SL tablet [[id number]]   Have you contacted your pharmacy to request a refill? Yes   Which pharmacy would you like this sent to?  [facility]  [address]  [place]  Patient notified that their request is being sent to the clinical staff for review and that they should receive a response within 2 business days.   Please advise at walked into office

## 2025-01-07 ENCOUNTER — Other Ambulatory Visit: Payer: Self-pay

## 2025-01-07 ENCOUNTER — Telehealth: Payer: Self-pay

## 2025-01-07 DIAGNOSIS — G894 Chronic pain syndrome: Secondary | ICD-10-CM

## 2025-01-07 DIAGNOSIS — I2510 Atherosclerotic heart disease of native coronary artery without angina pectoris: Secondary | ICD-10-CM

## 2025-01-07 MED ORDER — NITROGLYCERIN 0.4 MG SL SUBL: 0.4000 mg | SUBLINGUAL_TABLET | SUBLINGUAL | 5 refills | Status: AC | PRN

## 2025-01-07 MED ORDER — OXYCODONE HCL 30 MG PO TABS: 30.0000 mg | ORAL_TABLET | Freq: Four times a day (QID) | ORAL | 0 refills | Status: AC | PRN

## 2025-01-07 NOTE — Telephone Encounter (Signed)
 Copied from CRM [id number]. Topic: Clinical - Medication Refill >> Jan 07, 2025 11:13 AM Wess RAMAN wrote: Medication: nitroGLYCERIN  (NITROSTAT ) 0.4 MG SL tablet  oxycodone  (ROXICODONE ) 30 MG immediate release tablet  Has the patient contacted their pharmacy? Yes (Agent: If no, request that the patient contact the pharmacy for the refill. If patient does not wish to contact the pharmacy document the reason why and proceed with request.) (Agent: If yes, when and what did the pharmacy advise?)  This is the patient's preferred pharmacy:  [facility] [phone] - [place], [place] - [address] AT [facility] [address] [place] [address] Phone: [phone] Fax: [phone]  Is this the correct pharmacy for this prescription? Yes If no, delete pharmacy and type the correct one.   Has the prescription been filled recently? Yes  Is the patient out of the medication? Yes  Has the patient been seen for an appointment in the last year OR does the patient have an upcoming appointment? Yes  Can we respond through MyChart? No  Agent: Please be advised that Rx refills may take up to 3 business days. We ask that you follow-up with your pharmacy.

## 2025-01-07 NOTE — Telephone Encounter (Signed)
 Refill request sent to PCP.

## 2025-01-07 NOTE — Telephone Encounter (Signed)
 Prescription Request  01/07/2025  LOV: 11/21/2024  What is the name of the medication or equipment? oxycodone  (ROXICODONE ) 30 MG immediate release tablet [[id number]]   nitroGLYCERIN  (NITROSTAT ) 0.4 MG SL tablet [[id number]]    Have you contacted your pharmacy to request a refill? No   Which pharmacy would you like this sent to?  [facility] [address] [place]   Patient notified that their request is being sent to the clinical staff for review and that they should receive a response within 2 business days.   Please advise at walked into office

## 2025-01-17 ENCOUNTER — Telehealth: Payer: Self-pay

## 2025-01-17 ENCOUNTER — Other Ambulatory Visit: Payer: Self-pay

## 2025-01-17 DIAGNOSIS — K5909 Other constipation: Secondary | ICD-10-CM

## 2025-01-17 MED ORDER — LINACLOTIDE 145 MCG PO CAPS: 145.0000 ug | ORAL_CAPSULE | Freq: Every day | ORAL | 5 refills | Status: AC

## 2025-01-17 NOTE — Telephone Encounter (Signed)
 Prescription Request  01/17/2025  LOV: 11/21/2024  What is the name of the medication or equipment? linaclotide  (LINZESS ) 145 MCG CAPS capsule [[id number]]   Have you contacted your pharmacy to request a refill? No   Which pharmacy would you like this sent to?   [facility] [address] [place]  Patient notified that their request is being sent to the clinical staff for review and that they should receive a response within 2 business days.   Please advise at patient walk in office

## 2025-01-17 NOTE — Telephone Encounter (Signed)
 Linzess  sent to pharmacy

## 2025-02-01 ENCOUNTER — Other Ambulatory Visit: Payer: Self-pay

## 2025-03-25 ENCOUNTER — Ambulatory Visit

## 2025-04-22 ENCOUNTER — Ambulatory Visit: Admitting: Radiation Oncology
